# Patient Record
Sex: Male | Born: 1955
Health system: Southern US, Community
[De-identification: ages and names within clinical notes are randomized; demographics above are authoritative.]

## PROBLEM LIST (undated history)

## (undated) DIAGNOSIS — I82622 Acute embolism and thrombosis of deep veins of left upper extremity: Secondary | ICD-10-CM

## (undated) DIAGNOSIS — I619 Nontraumatic intracerebral hemorrhage, unspecified: Secondary | ICD-10-CM

## (undated) DIAGNOSIS — I10 Essential (primary) hypertension: Secondary | ICD-10-CM

## (undated) DIAGNOSIS — Z8619 Personal history of other infectious and parasitic diseases: Secondary | ICD-10-CM

## (undated) DIAGNOSIS — E669 Obesity, unspecified: Secondary | ICD-10-CM

## (undated) DIAGNOSIS — R4189 Other symptoms and signs involving cognitive functions and awareness: Secondary | ICD-10-CM

## (undated) DIAGNOSIS — E785 Hyperlipidemia, unspecified: Secondary | ICD-10-CM

## (undated) DIAGNOSIS — I214 Non-ST elevation (NSTEMI) myocardial infarction: Secondary | ICD-10-CM

## (undated) HISTORY — DX: Obesity, unspecified: E66.9

## (undated) HISTORY — DX: Other symptoms and signs involving cognitive functions and awareness: R41.89

## (undated) HISTORY — DX: Hyperlipidemia, unspecified: E78.5

## (undated) HISTORY — DX: Acute embolism and thrombosis of deep veins of left upper extremity: I82.622

## (undated) HISTORY — PX: ANKLE SURGERY: SHX546

## (undated) HISTORY — DX: Essential (primary) hypertension: I10

## (undated) HISTORY — DX: Non-ST elevation (NSTEMI) myocardial infarction: I21.4

## (undated) HISTORY — DX: Nontraumatic intracerebral hemorrhage, unspecified: I61.9

## (undated) HISTORY — DX: Personal history of other infectious and parasitic diseases: Z86.19

---

## 1998-02-23 ENCOUNTER — Encounter: Admission: RE | Admit: 1998-02-23 | Discharge: 1998-02-23 | Payer: Self-pay | Admitting: Family Medicine

## 1998-03-28 ENCOUNTER — Encounter: Admission: RE | Admit: 1998-03-28 | Discharge: 1998-03-28 | Payer: Self-pay | Admitting: Sports Medicine

## 2002-10-02 ENCOUNTER — Encounter: Payer: Self-pay | Admitting: Emergency Medicine

## 2002-10-02 ENCOUNTER — Inpatient Hospital Stay (HOSPITAL_COMMUNITY): Admission: EM | Admit: 2002-10-02 | Discharge: 2002-10-11 | Payer: Self-pay | Admitting: Emergency Medicine

## 2002-10-04 ENCOUNTER — Encounter: Payer: Self-pay | Admitting: Neurology

## 2002-10-06 ENCOUNTER — Encounter: Payer: Self-pay | Admitting: Neurology

## 2002-10-06 ENCOUNTER — Encounter: Payer: Self-pay | Admitting: Pediatrics

## 2002-10-11 ENCOUNTER — Inpatient Hospital Stay (HOSPITAL_COMMUNITY)
Admission: RE | Admit: 2002-10-11 | Discharge: 2002-10-15 | Payer: Self-pay | Admitting: Physical Medicine & Rehabilitation

## 2002-10-19 ENCOUNTER — Ambulatory Visit (HOSPITAL_COMMUNITY)
Admission: RE | Admit: 2002-10-19 | Discharge: 2002-10-19 | Payer: Self-pay | Admitting: Physical Medicine & Rehabilitation

## 2002-10-19 ENCOUNTER — Encounter
Admission: RE | Admit: 2002-10-19 | Discharge: 2003-01-17 | Payer: Self-pay | Admitting: Physical Medicine & Rehabilitation

## 2002-11-18 ENCOUNTER — Encounter
Admission: RE | Admit: 2002-11-18 | Discharge: 2003-02-16 | Payer: Self-pay | Admitting: Physical Medicine & Rehabilitation

## 2003-01-18 ENCOUNTER — Encounter
Admission: RE | Admit: 2003-01-18 | Discharge: 2003-01-20 | Payer: Self-pay | Admitting: Physical Medicine & Rehabilitation

## 2003-04-15 ENCOUNTER — Emergency Department (HOSPITAL_COMMUNITY): Admission: EM | Admit: 2003-04-15 | Discharge: 2003-04-15 | Payer: Self-pay | Admitting: Emergency Medicine

## 2004-02-11 ENCOUNTER — Emergency Department (HOSPITAL_COMMUNITY): Admission: EM | Admit: 2004-02-11 | Discharge: 2004-02-11 | Payer: Self-pay | Admitting: Emergency Medicine

## 2004-03-12 ENCOUNTER — Ambulatory Visit (HOSPITAL_COMMUNITY): Admission: RE | Admit: 2004-03-12 | Discharge: 2004-03-12 | Payer: Self-pay

## 2005-01-14 DIAGNOSIS — R4189 Other symptoms and signs involving cognitive functions and awareness: Secondary | ICD-10-CM

## 2005-01-14 DIAGNOSIS — I619 Nontraumatic intracerebral hemorrhage, unspecified: Secondary | ICD-10-CM

## 2005-01-14 HISTORY — DX: Nontraumatic intracerebral hemorrhage, unspecified: I61.9

## 2005-01-14 HISTORY — DX: Other symptoms and signs involving cognitive functions and awareness: R41.89

## 2005-11-27 ENCOUNTER — Emergency Department (HOSPITAL_COMMUNITY): Admission: EM | Admit: 2005-11-27 | Discharge: 2005-11-27 | Payer: Self-pay | Admitting: Emergency Medicine

## 2011-06-27 ENCOUNTER — Encounter (HOSPITAL_COMMUNITY): Payer: Self-pay | Admitting: *Deleted

## 2011-06-27 ENCOUNTER — Emergency Department (HOSPITAL_COMMUNITY)
Admission: EM | Admit: 2011-06-27 | Discharge: 2011-06-27 | Disposition: A | Discharge status: Home / Self Care | Payer: Medicare Other | Attending: Emergency Medicine | Admitting: Emergency Medicine

## 2011-06-27 ENCOUNTER — Emergency Department (HOSPITAL_COMMUNITY): Payer: Medicare Other

## 2011-06-27 DIAGNOSIS — Z7982 Long term (current) use of aspirin: Secondary | ICD-10-CM | POA: Insufficient documentation

## 2011-06-27 DIAGNOSIS — I10 Essential (primary) hypertension: Secondary | ICD-10-CM | POA: Insufficient documentation

## 2011-06-27 DIAGNOSIS — Z8673 Personal history of transient ischemic attack (TIA), and cerebral infarction without residual deficits: Secondary | ICD-10-CM | POA: Insufficient documentation

## 2011-06-27 DIAGNOSIS — R42 Dizziness and giddiness: Secondary | ICD-10-CM | POA: Insufficient documentation

## 2011-06-27 LAB — CBC
MCHC: 34 g/dL (ref 30.0–36.0)
Platelets: 230 10*3/uL (ref 150–400)
RDW: 13.1 % (ref 11.5–15.5)
WBC: 7 10*3/uL (ref 4.0–10.5)

## 2011-06-27 LAB — BASIC METABOLIC PANEL
CO2: 25 mEq/L (ref 19–32)
Calcium: 9.5 mg/dL (ref 8.4–10.5)
Chloride: 101 mEq/L (ref 96–112)
GFR calc Af Amer: >90 mL/min (ref >90)
Sodium: 137 mEq/L (ref 135–145)

## 2011-06-27 LAB — DIFFERENTIAL
Basophils Absolute: 0 10*3/uL (ref 0.0–0.1)
Basophils Relative: 0 % (ref 0–1)
Eosinophils Relative: 2 % (ref 0–5)
Lymphocytes Relative: 34 % (ref 12–46)
Neutro Abs: 3.8 10*3/uL (ref 1.7–7.7)

## 2011-06-27 LAB — POCT I-STAT TROPONIN I: Troponin i, poc: 0 ng/mL (ref 0.00–0.08)

## 2011-06-27 LAB — GLUCOSE, CAPILLARY: Glucose-Capillary: 92 mg/dL (ref 70–99)

## 2011-06-27 MED ORDER — LISINOPRIL-HYDROCHLOROTHIAZIDE 20-12.5 MG PO TABS: 1.0000 | ORAL_TABLET | Freq: Every day | ORAL | Status: DC

## 2011-06-27 MED ORDER — LISINOPRIL 20 MG PO TABS
20.0000 mg | ORAL_TABLET | ORAL | Status: AC
  Administered 2011-06-27: 20 mg via ORAL
  Filled 2011-06-27 (×2): qty 1

## 2011-06-27 NOTE — ED Notes (Signed)
PA at bedside.

## 2011-06-27 NOTE — ED Notes (Signed)
Pt reports hx of HTN but has not been taking any medications, states yesterday while mowing the grass felt extremely dizzy. Denies any chest pain. Pt with hx of stroke and has dysarthria due to stroke. Pt reports no new symptoms.

## 2011-06-27 NOTE — Discharge Instructions (Signed)
Start blood pressure medication and follow up closely with your doctor for recheck and ongoing management of blood pressure. Return to ER at any time for emergent changing or worsening of symptoms.   Hypertension Information As your heart beats, it forces blood through your arteries. This force is your blood pressure. If the pressure is too high, it is called hypertension (HTN) or high blood pressure. HTN is dangerous because you may have it and not know it. High blood pressure may mean that your heart has to work harder to pump blood. Your arteries may be narrow or stiff. The extra work puts you at risk for heart disease, stroke, and other problems.  Blood pressure consists of two numbers, a higher number over a lower, 110/72, for example. It is stated as "110 over 72." The ideal is below 120 for the top number (systolic) and under 80 for the bottom (diastolic).  You should pay close attention to your blood pressure if you have certain conditions such as:  Heart failure.   Prior heart attack.   Diabetes   Chronic kidney disease.   Prior stroke.   Multiple risk factors for heart disease.  To see if you have HTN, your blood pressure should be measured while you are seated with your arm held at the level of the heart. It should be measured at least twice. A one-time elevated blood pressure reading (especially in the Emergency Department) does not mean that you need treatment. There may be conditions in which the blood pressure is different between your right and left arms. It is important to see your caregiver soon for a recheck. Most people have essential hypertension which means that there is not a specific cause. This type of high blood pressure may be lowered by changing lifestyle factors such as:  Stress.   Smoking.   Lack of exercise.   Excessive weight.   Drug/tobacco/alcohol use.   Eating less salt.  Most people do not have symptoms from high blood pressure until it has caused  damage to the body. Effective treatment can often prevent, delay or reduce that damage. TREATMENT  Treatment for high blood pressure, when a cause has been identified, is directed at the cause. There are a large number of medications to treat HTN. These fall into several categories, and your caregiver will help you select the medicines that are best for you. Medications may have side effects. You should review side effects with your caregiver. If your blood pressure stays high after you have made lifestyle changes or started on medicines,   Your medication(s) may need to be changed.   Other problems may need to be addressed.   Be certain you understand your prescriptions, and know how and when to take your medicine.   Be sure to follow up with your caregiver within the time frame advised (usually within two weeks) to have your blood pressure rechecked and to review your medications.   If you are taking more than one medicine to lower your blood pressure, make sure you know how and at what times they should be taken. Taking two medicines at the same time can result in blood pressure that is too low.  Document Released: 03/05/2005 Document Revised: 09/12/2010 Document Reviewed: 03/12/2007 [facility] Patient Information 2012 [place], [place].

## 2011-06-27 NOTE — ED Provider Notes (Signed)
12:20 PM  Date: 06/27/2011  Rate: 62  Rhythm: normal sinus rhythm  QRS Axis: left  Intervals: normal  ST/T Wave abnormalities: normal  Conduction Disutrbances:none  Narrative Interpretation: Normal EKG  Old EKG Reviewed: none available    Carleene Cooper III, MD 06/27/11 1221

## 2011-06-27 NOTE — ED Provider Notes (Signed)
History     CSN: [id number]  Arrival date & time 06/27/11  1109   First MD Initiated Contact with Patient 06/27/11 1156      Chief Complaint  Patient presents with  . Hypertension  . Dizziness    (Consider location/radiation/quality/duration/timing/severity/associated sxs/prior treatment) HPI  Patient presents to emergency department with his mother at bedside with complaint of acute onset dizziness that began yesterday afternoon when he was mowing the yard in the heat of the day. Patient states he felt dizzy but finished the yard. Patient states that once he got inside and laid down the dizziness resolved and has felt fine since then with no return of dizziness. Patient states that he feels well today however he mentioned the dizziness to his mother and she also found out that he has not been taking his blood pressure medicine for the last 2-3 years. Mother states she was very concerned because patient has history of stroke stating "years ago he had a blood vessel rupture in his brain because his pressure was so high and he had to be in hospital for 2-3 weeks." Patient has not seen his primary care doctor in many years and mother attempted to get him in with her PCP but was told that it was a two-week wait. Patient states that other than his history of high blood pressure stroke he has no other known medical problems. Patient does take a daily baby aspirin. Patient has some residual dysarthria status post stroke however he has no residual extremity weakness. Once again patient feels well today denying headache, dizziness, facial droop, slurred speech, extremity numbness/tingling/weakness, difficulty ambulating, or loss of coordination. Patient denies any chest pain throughout event yesterday or today. He denies associated shortness of breath.  Past Medical History  Diagnosis Date  . Hypertension   . Stroke     No past surgical history on file.  No family history on file.  History    Substance Use Topics  . Smoking status: Never Smoker   . Smokeless tobacco: Not on file  . Alcohol Use: No      Review of Systems  All other systems reviewed and are negative.    Allergies  Review of patient's allergies indicates no known allergies.  Home Medications  No current outpatient prescriptions on file.  BP 166/94  Pulse 63  Temp 98.5 F (36.9 C) (Oral)  Resp 18  SpO2 99%  Physical Exam  Nursing note and vitals reviewed. Constitutional: He is oriented to person, place, and time. He appears well-developed and well-nourished. No distress.  HENT:  Head: Normocephalic and atraumatic.  Eyes: Conjunctivae and EOM are normal. Pupils are equal, round, and reactive to light.  Neck: Normal range of motion. Neck supple.  Cardiovascular: Normal rate, regular rhythm, normal heart sounds and intact distal pulses.  Exam reveals no gallop and no friction rub.   No murmur heard. Pulmonary/Chest: Effort normal and breath sounds normal. No respiratory distress. He has no wheezes. He has no rales. He exhibits no tenderness.  Abdominal: Soft. Bowel sounds are normal. He exhibits no distension and no mass. There is no tenderness. There is no rebound and no guarding.  Musculoskeletal: Normal range of motion. He exhibits no edema and no tenderness.  Neurological: He is alert and oriented to person, place, and time. No cranial nerve deficit. Coordination normal.       Normal finger to nose and heel to shin  Skin: Skin is warm and dry. No rash noted. He is  not diaphoretic. No erythema.  Psychiatric: He has a normal mood and affect.    ED Course  Procedures (including critical care time)  PO lisinopril.    Labs Reviewed  GLUCOSE, CAPILLARY  CBC  DIFFERENTIAL  BASIC METABOLIC PANEL   Ct Head Wo Contrast  06/27/2011  *RADIOLOGY REPORT*  Clinical Data: 56 year old male with dizziness.  CT HEAD WITHOUT CONTRAST  Technique:  Contiguous axial images were obtained from the base of  the skull through the vertex without contrast.  Comparison: 11/27/2005.  Findings: Left maxillary sinus small mucous retention cyst.  Mild ethmoid mucosal thickening is stable.  Other Visualized paranasal sinuses and mastoids are clear.  No acute osseous abnormality identified.  Visualized orbits and scalp soft tissues are within normal limits.  Mild Calcified atherosclerosis at the skull base.  Chronic left parietal or posterior temporal lobe encephalomalacia, including dystrophic calcification.  Chronic ex vacuo enlargement of the left temporal and occipital horns and atrium.  Other ventricles are stable and within normal limits. No midline shift, mass effect, or evidence of mass lesion.  Stable patchy periventricular white matter hypodensity. No evidence of cortically based acute infarction identified.  No acute intracranial hemorrhage identified.  No suspicious intracranial vascular hyperdensity.  IMPRESSION: No acute intracranial abnormality.  Stable chronic changes including the left hemisphere encephalomalacia with ex vacuo changes to the left lateral ventricle.  Original Report Authenticated By: Harley Hallmark, M.D.     1. Hypertension   2. Dizziness       MDM  Patient had and has no signs or symptoms of TIA or stroke with his brief resolved dizziness that occurred while mowing in the heat yesterday. Has no neuro focal deficits and is ambulating without difficulty. No ataxia. He has no complaints today. His blood pressure has been elevated in the ER but he has no signs or symptoms of hypertensive urgency or emergency. We will restart him on a combination blood pressure medication. His mother and patient are agreeable to close followup with primary care provider in the near future for ongoing management of blood pressure.        [place], [place] 06/27/11 [phone]

## 2011-06-27 NOTE — ED Notes (Signed)
Pt in CT.

## 2011-06-29 NOTE — ED Provider Notes (Signed)
Medical screening examination/treatment/procedure(s) were performed by non-physician practitioner and as supervising physician I was immediately available for consultation/collaboration.   Carleene Cooper III, MD 06/29/11 [phone]

## 2011-07-04 ENCOUNTER — Encounter: Payer: Self-pay | Admitting: Family Medicine

## 2011-07-04 ENCOUNTER — Ambulatory Visit (INDEPENDENT_AMBULATORY_CARE_PROVIDER_SITE_OTHER): Payer: Medicare Other | Admitting: Family Medicine

## 2011-07-04 VITALS — BP 148/91 | HR 69 | Temp 98.4°F | Ht 62.0 in | Wt 194.2 lb

## 2011-07-04 DIAGNOSIS — I10 Essential (primary) hypertension: Secondary | ICD-10-CM

## 2011-07-04 DIAGNOSIS — R4189 Other symptoms and signs involving cognitive functions and awareness: Secondary | ICD-10-CM | POA: Insufficient documentation

## 2011-07-04 DIAGNOSIS — E669 Obesity, unspecified: Secondary | ICD-10-CM

## 2011-07-04 DIAGNOSIS — F09 Unspecified mental disorder due to known physiological condition: Secondary | ICD-10-CM

## 2011-07-04 DIAGNOSIS — I6930 Unspecified sequelae of cerebral infarction: Secondary | ICD-10-CM | POA: Insufficient documentation

## 2011-07-04 DIAGNOSIS — I619 Nontraumatic intracerebral hemorrhage, unspecified: Secondary | ICD-10-CM

## 2011-07-04 DIAGNOSIS — E785 Hyperlipidemia, unspecified: Secondary | ICD-10-CM | POA: Insufficient documentation

## 2011-07-04 MED ORDER — LISINOPRIL-HYDROCHLOROTHIAZIDE 20-12.5 MG PO TABS: 1.0000 | ORAL_TABLET | Freq: Every day | ORAL | Status: DC

## 2011-07-04 NOTE — Assessment & Plan Note (Signed)
Cognitive impairment remains s/p CVA.  Will continue to monitor. Requested records from prior PCP

## 2011-07-04 NOTE — Assessment & Plan Note (Signed)
2/2 HTN.  On aspirin, will ensure chol levels stable when next returns for CPE.

## 2011-07-04 NOTE — Assessment & Plan Note (Signed)
h/o this, check FLP when returns fasting.

## 2011-07-04 NOTE — Assessment & Plan Note (Signed)
Chronic, now compliant with meds. Has only been on med for 1 wk.  Give med more time. Recheck when returns for f/u. rec return for CPE.

## 2011-07-04 NOTE — Progress Notes (Signed)
Subjective:    Patient ID: [name] Cruz, male    DOB: [birth year], 56 y.o.   MRN: [id number]  HPI CC: establish care  Presents to establish care today.  Prior saw Dr. Vear Clock.  Recent ER visit last week after dizzy episode that started when workout out in yard in heat.  Has been off BP meds for last few years.  Has not seen Dr. Vear Clock in several years.  ER records reviewed ==> Cr normal, head CT - no acute changes.  Started on lisinopril/HCTZ.  Compliant with this.  S/p hemorrhagic stroke 2007 with residual cognitive impairment - doesn't drive.  Lives in [place], mother lives in [place].  EtOH use - discussed.  Denies excess.  Mother was concerned.  Preventative: Last CPE - unsure, none recently. Due for this. No colonoscopy, no tetanus shot.  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  Past histories reviewed and updated if relevant as below. Patient Active Problem List  Diagnosis  . Stroke, hemorrhagic  . Cognitive impairment  . HLD (hyperlipidemia)  . HTN (hypertension)  . Obesity   Past Medical History  Diagnosis Date  . Stroke, hemorrhagic 2007    residual cognitive impairment, vision problem, no driving  . Cognitive impairment 2007    after stroke, saw rehab but told to stop because was too upsetting to him  . History of chicken pox   . HLD (hyperlipidemia)   . HTN (hypertension)   . Obesity    Past Surgical History  Procedure Date  . Ankle surgery 1990s    right foot with plate and screws   History  Substance Use Topics  . Smoking status: Never Smoker   . Smokeless tobacco: Never Used  . Alcohol Use: Yes     regular   Family History  Problem Relation Age of Onset  . Alzheimer's disease Maternal Grandfather   . Coronary  artery disease Neg Hx   . Stroke Neg Hx   . Cancer Mother     lymphoma  . Diabetes Neg Hx   . Alcohol abuse Father     smoker   No Known Allergies Current Outpatient Prescriptions on File Prior to Visit  Medication Sig Dispense Refill  . lisinopril-hydrochlorothiazide (ZESTORETIC) 20-12.5 MG per tablet Take 1 tablet by mouth daily.  30 tablet  0     Review of Systems  Constitutional: Negative for fever, chills, activity change, appetite change, fatigue and unexpected weight change.  HENT: Negative for hearing loss and neck pain.   Eyes: Negative for visual disturbance.  Respiratory: Negative for cough, chest tightness, shortness of breath and wheezing.   Cardiovascular: Negative for chest pain, palpitations and leg swelling.  Gastrointestinal: Negative for nausea, vomiting, abdominal pain, diarrhea, constipation, blood in stool and abdominal distention.  Genitourinary: Negative for hematuria and difficulty urinating.  Musculoskeletal: Negative for myalgias and arthralgias.  Skin: Negative for rash.  Neurological: Negative for dizziness, seizures, syncope and headaches.  Hematological: Does not bruise/bleed easily.  Psychiatric/Behavioral: Positive for agitation. Negative for dysphoric mood. The patient is not nervous/anxious.        Objective:   Physical Exam  Nursing note and vitals reviewed. Constitutional: He is oriented to person, place, and time. He appears well-developed and well-nourished. No distress.  HENT:  Head: Normocephalic and atraumatic.  Right  Ear: Hearing, tympanic membrane, external ear and ear canal normal.  Left Ear: Hearing, tympanic membrane, external ear and ear canal normal.  Nose: Nose normal.  Mouth/Throat: Oropharynx is clear and moist. No oropharyngeal exudate.  Eyes: Conjunctivae and EOM are normal. Pupils are equal, round, and reactive to light. No scleral icterus.  Neck: Normal range of motion. Neck supple. No thyromegaly present.    Cardiovascular: Normal rate, regular rhythm, normal heart sounds and intact distal pulses.   No murmur heard. Pulses:      Radial pulses are 2+ on the right side, and 2+ on the left side.  Pulmonary/Chest: Effort normal and breath sounds normal. No respiratory distress. He has no wheezes. He has no rales.  Abdominal: Soft. Bowel sounds are normal. He exhibits no distension and no mass. There is no tenderness. There is no rebound and no guarding.  Musculoskeletal: Normal range of motion. He exhibits no edema.  Lymphadenopathy:    He has no cervical adenopathy.  Neurological: He is alert and oriented to person, place, and time.       CN grossly intact, station and gait intact  Skin: Skin is warm and dry. No rash noted.  Psychiatric: He has a normal mood and affect. His behavior is normal. Judgment and thought content normal.       Assessment & Plan:

## 2011-07-04 NOTE — Patient Instructions (Signed)
Continue med as up to now. Return in 4-6 weeks fasting for blood work and afterwards for physical. Start walking several times a week. Good to see you today, call us with questions.

## 2011-07-04 NOTE — Assessment & Plan Note (Signed)
Encouraged start walking daily

## 2011-08-12 ENCOUNTER — Other Ambulatory Visit: Payer: Self-pay | Admitting: Family Medicine

## 2011-08-12 ENCOUNTER — Other Ambulatory Visit (INDEPENDENT_AMBULATORY_CARE_PROVIDER_SITE_OTHER): Payer: Medicare Other

## 2011-08-12 DIAGNOSIS — I619 Nontraumatic intracerebral hemorrhage, unspecified: Secondary | ICD-10-CM

## 2011-08-12 DIAGNOSIS — E785 Hyperlipidemia, unspecified: Secondary | ICD-10-CM

## 2011-08-12 DIAGNOSIS — F09 Unspecified mental disorder due to known physiological condition: Secondary | ICD-10-CM

## 2011-08-12 DIAGNOSIS — E669 Obesity, unspecified: Secondary | ICD-10-CM

## 2011-08-12 DIAGNOSIS — R4189 Other symptoms and signs involving cognitive functions and awareness: Secondary | ICD-10-CM

## 2011-08-12 DIAGNOSIS — I10 Essential (primary) hypertension: Secondary | ICD-10-CM

## 2011-08-12 LAB — TSH: TSH: 0.85 u[IU]/mL (ref 0.35–5.50)

## 2011-08-12 LAB — LIPID PANEL
Total CHOL/HDL Ratio: 7
Triglycerides: 348 mg/dL — ABNORMAL HIGH (ref 0.0–149.0)

## 2011-08-12 LAB — COMPREHENSIVE METABOLIC PANEL
ALT: 16 U/L (ref 0–53)
Albumin: 4.1 g/dL (ref 3.5–5.2)
CO2: 28 mEq/L (ref 19–32)
Calcium: 9.8 mg/dL (ref 8.4–10.5)
Chloride: 97 mEq/L (ref 96–112)
GFR: 130.29 mL/min (ref >60.00)
Glucose, Bld: 89 mg/dL (ref 70–99)
Sodium: 134 mEq/L — ABNORMAL LOW (ref 135–145)
Total Bilirubin: 0.5 mg/dL (ref 0.3–1.2)
Total Protein: 6.9 g/dL (ref 6.0–8.3)

## 2011-08-12 MED ORDER — ATORVASTATIN CALCIUM 40 MG PO TABS: 40.0000 mg | ORAL_TABLET | Freq: Every day | ORAL | Status: DC

## 2011-08-13 ENCOUNTER — Telehealth: Payer: Self-pay

## 2011-08-13 NOTE — Telephone Encounter (Signed)
pts mother called to get lab results. Pt did not understand recording. Patient's mother notified as instructed by telephone with the result notes. Also advised med was sent to [facility] [address] Mrs. [name] Cruz will pick up med for pt. Mrs [name] Cruz ask all calls come to her due to pt's not understanding.

## 2011-08-19 ENCOUNTER — Encounter: Payer: Medicare Other | Admitting: Family Medicine

## 2012-04-26 ENCOUNTER — Inpatient Hospital Stay (HOSPITAL_COMMUNITY)
Admission: EM | Admit: 2012-04-26 | Discharge: 2012-04-28 | DRG: 101 | Disposition: A | Discharge status: Home / Self Care | Payer: Medicare Other | Attending: Internal Medicine | Admitting: Internal Medicine

## 2012-04-26 ENCOUNTER — Encounter (HOSPITAL_COMMUNITY): Payer: Self-pay | Admitting: Emergency Medicine

## 2012-04-26 ENCOUNTER — Emergency Department (HOSPITAL_COMMUNITY): Payer: Medicare Other

## 2012-04-26 DIAGNOSIS — G40909 Epilepsy, unspecified, not intractable, without status epilepticus: Secondary | ICD-10-CM | POA: Diagnosis present

## 2012-04-26 DIAGNOSIS — I10 Essential (primary) hypertension: Secondary | ICD-10-CM | POA: Diagnosis present

## 2012-04-26 DIAGNOSIS — F1010 Alcohol abuse, uncomplicated: Secondary | ICD-10-CM | POA: Diagnosis present

## 2012-04-26 DIAGNOSIS — R4189 Other symptoms and signs involving cognitive functions and awareness: Secondary | ICD-10-CM

## 2012-04-26 DIAGNOSIS — R4182 Altered mental status, unspecified: Secondary | ICD-10-CM

## 2012-04-26 DIAGNOSIS — R569 Unspecified convulsions: Principal | ICD-10-CM

## 2012-04-26 DIAGNOSIS — I619 Nontraumatic intracerebral hemorrhage, unspecified: Secondary | ICD-10-CM

## 2012-04-26 DIAGNOSIS — E669 Obesity, unspecified: Secondary | ICD-10-CM | POA: Diagnosis present

## 2012-04-26 DIAGNOSIS — I69919 Unspecified symptoms and signs involving cognitive functions following unspecified cerebrovascular disease: Secondary | ICD-10-CM

## 2012-04-26 DIAGNOSIS — E785 Hyperlipidemia, unspecified: Secondary | ICD-10-CM | POA: Diagnosis present

## 2012-04-26 DIAGNOSIS — Z6832 Body mass index (BMI) 32.0-32.9, adult: Secondary | ICD-10-CM

## 2012-04-26 DIAGNOSIS — I6930 Unspecified sequelae of cerebral infarction: Secondary | ICD-10-CM | POA: Diagnosis present

## 2012-04-26 LAB — CBC
HCT: 43.8 % (ref 39.0–52.0)
Hemoglobin: 14.9 g/dL (ref 13.0–17.0)
MCH: 31.5 pg (ref 26.0–34.0)
MCHC: 34 g/dL (ref 30.0–36.0)
MCV: 92.6 fL (ref 78.0–100.0)
Platelets: 292 10*3/uL (ref 150–400)
RBC: 4.73 MIL/uL (ref 4.22–5.81)
RDW: 12.9 % (ref 11.5–15.5)
WBC: 15.1 K/uL — ABNORMAL HIGH (ref 4.0–10.5)

## 2012-04-26 LAB — POCT I-STAT, CHEM 8
Chloride: 108 mEq/L (ref 96–112)
Glucose, Bld: 194 mg/dL — ABNORMAL HIGH (ref 70–99)
HCT: 46 % (ref 39.0–52.0)
Potassium: 3.4 mEq/L — ABNORMAL LOW (ref 3.5–5.1)

## 2012-04-26 LAB — GLUCOSE, CAPILLARY: Glucose-Capillary: 167 mg/dL — ABNORMAL HIGH (ref 70–99)

## 2012-04-26 LAB — APTT: aPTT: 30 seconds (ref 24–37)

## 2012-04-26 LAB — URINALYSIS, ROUTINE W REFLEX MICROSCOPIC
Hgb urine dipstick: NEGATIVE
Ketones, ur: 15 mg/dL — AB
Protein, ur: NEGATIVE mg/dL
Urobilinogen, UA: 0.2 mg/dL (ref 0.0–1.0)

## 2012-04-26 LAB — DIFFERENTIAL
Basophils Absolute: 0.1 10*3/uL (ref 0.0–0.1)
Basophils Relative: 1 % (ref 0–1)
Eosinophils Absolute: 0.2 K/uL (ref 0.0–0.7)
Eosinophils Relative: 1 % (ref 0–5)
Lymphocytes Relative: 37 % (ref 12–46)
Lymphs Abs: 5.6 K/uL — ABNORMAL HIGH (ref 0.7–4.0)
Monocytes Absolute: 0.9 K/uL (ref 0.1–1.0)
Monocytes Relative: 6 % (ref 3–12)
Neutro Abs: 8.3 10*3/uL — ABNORMAL HIGH (ref 1.7–7.7)
Neutrophils Relative %: 55 % (ref 43–77)

## 2012-04-26 LAB — COMPREHENSIVE METABOLIC PANEL WITH GFR
ALT: 19 U/L (ref 0–53)
BUN: 19 mg/dL (ref 6–23)
CO2: 13 meq/L — ABNORMAL LOW (ref 19–32)
Calcium: 10.1 mg/dL (ref 8.4–10.5)
Creatinine, Ser: 1.14 mg/dL (ref 0.50–1.35)
GFR calc Af Amer: 81 mL/min — ABNORMAL LOW (ref >90)
GFR calc non Af Amer: 70 mL/min — ABNORMAL LOW (ref >90)
Glucose, Bld: 194 mg/dL — ABNORMAL HIGH (ref 70–99)
Sodium: 143 meq/L (ref 135–145)

## 2012-04-26 LAB — ETHANOL: Alcohol, Ethyl (B): <11 mg/dL (ref 0–11)

## 2012-04-26 LAB — RAPID URINE DRUG SCREEN, HOSP PERFORMED
Amphetamines: NOT DETECTED
Benzodiazepines: POSITIVE — AB
Opiates: NOT DETECTED
Tetrahydrocannabinol: NOT DETECTED

## 2012-04-26 LAB — PROTIME-INR
INR: 1.1 (ref 0.00–1.49)
Prothrombin Time: 14.1 seconds (ref 11.6–15.2)

## 2012-04-26 LAB — COMPREHENSIVE METABOLIC PANEL
AST: 28 U/L (ref 0–37)
Albumin: 4.5 g/dL (ref 3.5–5.2)
Alkaline Phosphatase: 68 U/L (ref 39–117)
Chloride: 98 mEq/L (ref 96–112)
Potassium: 3.4 mEq/L — ABNORMAL LOW (ref 3.5–5.1)
Total Bilirubin: 0.4 mg/dL (ref 0.3–1.2)
Total Protein: 8.1 g/dL (ref 6.0–8.3)

## 2012-04-26 LAB — TROPONIN I: Troponin I: <0.3 ng/mL (ref <0.30)

## 2012-04-26 MED ORDER — LIDOCAINE HCL (CARDIAC) 20 MG/ML IV SOLN
INTRAVENOUS | Status: AC
  Filled 2012-04-26: qty 5

## 2012-04-26 MED ORDER — LISINOPRIL-HYDROCHLOROTHIAZIDE 20-12.5 MG PO TABS: 1.0000 | ORAL_TABLET | Freq: Every day | ORAL | Status: DC

## 2012-04-26 MED ORDER — ROCURONIUM BROMIDE 50 MG/5ML IV SOLN
INTRAVENOUS | Status: AC
  Filled 2012-04-26: qty 2

## 2012-04-26 MED ORDER — THIAMINE HCL 100 MG/ML IJ SOLN
100.0000 mg | Freq: Every day | INTRAMUSCULAR | Status: DC
  Filled 2012-04-26 (×3): qty 1

## 2012-04-26 MED ORDER — LORAZEPAM 2 MG/ML IJ SOLN: 2.0000 mg | INTRAMUSCULAR | Status: DC | PRN

## 2012-04-26 MED ORDER — ONDANSETRON HCL 4 MG PO TABS: 4.0000 mg | ORAL_TABLET | Freq: Four times a day (QID) | ORAL | Status: DC | PRN

## 2012-04-26 MED ORDER — SUCCINYLCHOLINE CHLORIDE 20 MG/ML IJ SOLN
INTRAMUSCULAR | Status: AC
  Filled 2012-04-26: qty 1

## 2012-04-26 MED ORDER — POLYETHYLENE GLYCOL 3350 17 G PO PACK
17.0000 g | PACK | Freq: Every day | ORAL | Status: DC | PRN
  Filled 2012-04-26: qty 1

## 2012-04-26 MED ORDER — LORAZEPAM 2 MG/ML IJ SOLN: 1.0000 mg | Freq: Four times a day (QID) | INTRAMUSCULAR | Status: DC | PRN

## 2012-04-26 MED ORDER — PNEUMOCOCCAL VAC POLYVALENT 25 MCG/0.5ML IJ INJ
0.5000 mL | INJECTION | INTRAMUSCULAR | Status: AC
  Administered 2012-04-27: 0.5 mL via INTRAMUSCULAR
  Filled 2012-04-26: qty 0.5

## 2012-04-26 MED ORDER — LISINOPRIL 20 MG PO TABS
20.0000 mg | ORAL_TABLET | Freq: Every day | ORAL | Status: DC
  Administered 2012-04-26 – 2012-04-28 (×3): 20 mg via ORAL
  Filled 2012-04-26 (×3): qty 1

## 2012-04-26 MED ORDER — DEXTROSE-NACL 5-0.45 % IV SOLN: INTRAVENOUS | Status: DC

## 2012-04-26 MED ORDER — LORAZEPAM 1 MG PO TABS: 1.0000 mg | ORAL_TABLET | Freq: Four times a day (QID) | ORAL | Status: DC | PRN

## 2012-04-26 MED ORDER — SODIUM CHLORIDE 0.9 % IV SOLN
1000.0000 mg | Freq: Once | INTRAVENOUS | Status: AC
  Administered 2012-04-26: 1000 mg via INTRAVENOUS
  Filled 2012-04-26: qty 20

## 2012-04-26 MED ORDER — SODIUM CHLORIDE 0.9 % IV SOLN
100.0000 mg | Freq: Three times a day (TID) | INTRAVENOUS | Status: DC
  Administered 2012-04-27 (×4): 100 mg via INTRAVENOUS
  Filled 2012-04-26 (×6): qty 2

## 2012-04-26 MED ORDER — SODIUM CHLORIDE 0.9 % IJ SOLN
3.0000 mL | Freq: Two times a day (BID) | INTRAMUSCULAR | Status: DC
  Administered 2012-04-27: 3 mL via INTRAVENOUS

## 2012-04-26 MED ORDER — ATORVASTATIN CALCIUM 40 MG PO TABS
40.0000 mg | ORAL_TABLET | Freq: Every day | ORAL | Status: DC
  Administered 2012-04-26 – 2012-04-27 (×2): 40 mg via ORAL
  Filled 2012-04-26 (×3): qty 1

## 2012-04-26 MED ORDER — FOLIC ACID 1 MG PO TABS
1.0000 mg | ORAL_TABLET | Freq: Every day | ORAL | Status: DC
  Administered 2012-04-26 – 2012-04-28 (×3): 1 mg via ORAL
  Filled 2012-04-26 (×3): qty 1

## 2012-04-26 MED ORDER — ASPIRIN EC 81 MG PO TBEC
81.0000 mg | DELAYED_RELEASE_TABLET | Freq: Every day | ORAL | Status: DC
  Administered 2012-04-26 – 2012-04-28 (×3): 81 mg via ORAL
  Filled 2012-04-26 (×3): qty 1

## 2012-04-26 MED ORDER — VITAMIN B-1 100 MG PO TABS
100.0000 mg | ORAL_TABLET | Freq: Every day | ORAL | Status: DC
  Administered 2012-04-26 – 2012-04-28 (×3): 100 mg via ORAL
  Filled 2012-04-26 (×3): qty 1

## 2012-04-26 MED ORDER — ADULT MULTIVITAMIN W/MINERALS CH
1.0000 | ORAL_TABLET | Freq: Every day | ORAL | Status: DC
  Administered 2012-04-26 – 2012-04-28 (×3): 1 via ORAL
  Filled 2012-04-26 (×3): qty 1

## 2012-04-26 MED ORDER — HYDROCHLOROTHIAZIDE 12.5 MG PO CAPS
12.5000 mg | ORAL_CAPSULE | Freq: Every day | ORAL | Status: DC
  Administered 2012-04-26 – 2012-04-28 (×3): 12.5 mg via ORAL
  Filled 2012-04-26 (×3): qty 1

## 2012-04-26 MED ORDER — HEPARIN SODIUM (PORCINE) 5000 UNIT/ML IJ SOLN
5000.0000 [IU] | Freq: Three times a day (TID) | INTRAMUSCULAR | Status: DC
  Administered 2012-04-26 – 2012-04-28 (×5): 5000 [IU] via SUBCUTANEOUS
  Filled 2012-04-26 (×8): qty 1

## 2012-04-26 MED ORDER — ETOMIDATE 2 MG/ML IV SOLN
INTRAVENOUS | Status: AC
  Filled 2012-04-26: qty 20

## 2012-04-26 MED ORDER — ONDANSETRON HCL 4 MG/2ML IJ SOLN: 4.0000 mg | Freq: Four times a day (QID) | INTRAMUSCULAR | Status: DC | PRN

## 2012-04-26 NOTE — ED Notes (Signed)
Pt CBG in [address]

## 2012-04-26 NOTE — ED Notes (Addendum)
Per EMS pt was at home & pt was c/o L arm pain & started to seize for about 3 mins, pt postictal upon EMS arrival, pt noted to have R grip weakness, R arm weakness, pt actively seized with EMS for 90 secs & received 5 mg Versed, upon arrival to ED pt non responsive to painful stimuli

## 2012-04-26 NOTE — ED Notes (Signed)
Pt removed oral airway. Respiratory at bedside, pt maintaining 98% O2 sat

## 2012-04-26 NOTE — ED Notes (Signed)
Family at bedside. 

## 2012-04-26 NOTE — ED Notes (Signed)
Ghim, MD notified re: UA & Urine drug screen, informed that pt is sedated & it is okay to wait until the pt is more alert to collect urine sample

## 2012-04-26 NOTE — ED Provider Notes (Addendum)
History     CSN: [id number]  Arrival date & time 04/26/12  1409   First MD Initiated Contact with Patient 04/26/12 1424      Chief Complaint  Patient presents with  . Code Stroke    (Consider location/radiation/quality/duration/timing/severity/associated sxs/prior treatment) HPI Comments: Level 5 caveat due to altered mental status.  Her history of hemorrhagic stroke from an aneurysm according to patient's sister who is also healthcare power of attorney. Patient was staying with his sister overnight and did his usual baseline and today was outside with his brother-in-law. The patient began complaining of numbness and abnormal sensations to his left upper extremity. Sister who is here at the bedside came over to the brother and saw him fall over from sitting position and began having full body shaking. EMS reports that he was post ictal and confused initially off of his baseline mentation. Before he regained completely back to his baseline, the patient had another what sounds like grand mal seizure on the ambulance truck. They administered 5 mg of IV Versed for his seizure. Patient currently is snoring respirations but spontaneous with no spontaneous movements.  The history is provided by a relative and medical records.    Past Medical History  Diagnosis Date  . Stroke, hemorrhagic 2007    thought 2/2 HTN (240sbp); residual cognitive impairment, vision problem, no driving  . Cognitive impairment 2007    after stroke, saw rehab but told to stop because was too upsetting to him  . History of chicken pox   . HLD (hyperlipidemia)   . HTN (hypertension)   . Obesity     Past Surgical History  Procedure Laterality Date  . Ankle surgery  1990s    right foot with plate and screws    Family History  Problem Relation Age of Onset  . Alzheimer's disease Maternal Grandfather   . Coronary artery disease Neg Hx   . Stroke Neg Hx   . Cancer Mother     lymphoma  . Diabetes Neg Hx   .  Alcohol abuse Father     smoker    History  Substance Use Topics  . Smoking status: Never Smoker   . Smokeless tobacco: Never Used  . Alcohol Use: Yes     Comment: regular      Review of Systems  Unable to perform ROS: Mental status change    Allergies  Review of patient's allergies indicates no known allergies.  Home Medications   Current Outpatient Rx  Name  Route  Sig  Dispense  Refill  . aspirin EC 81 MG tablet   Oral   Take 81 mg by mouth daily.         Marland Kitchen atorvastatin (LIPITOR) 40 MG tablet   Oral   Take 1 tablet (40 mg total) by mouth daily.   90 tablet   3   . lisinopril-hydrochlorothiazide (ZESTORETIC) 20-12.5 MG per tablet   Oral   Take 1 tablet by mouth daily.   30 tablet   6     BP 125/66  Pulse 118  Resp 19  SpO2 98%  Physical Exam  Nursing note and vitals reviewed. Constitutional: He appears well-developed and well-nourished.  Non-toxic appearance. No distress. He is sedated. Nasal cannula in place.  HENT:  Head: Normocephalic and atraumatic.  Cardiovascular: Normal rate.   Pulmonary/Chest: Effort normal. He has no wheezes. He has no rales.  Abdominal: Soft. He exhibits no distension. There is no tenderness.  Neurological: GCS eye subscore  is 2. GCS verbal subscore is 2. GCS motor subscore is 4.  Pt with depressed mentation due to post ictal state and received IV versed by EMS for seizure.    Skin: Skin is warm.    ED Course  Procedures (including critical care time)  Labs Reviewed  CBC - Abnormal; Notable for the following:    WBC 15.1 (*)    All other components within normal limits  DIFFERENTIAL - Abnormal; Notable for the following:    Neutro Abs 8.3 (*)    Lymphs Abs 5.6 (*)    All other components within normal limits  GLUCOSE, CAPILLARY - Abnormal; Notable for the following:    Glucose-Capillary 167 (*)    All other components within normal limits  POCT I-STAT, CHEM 8 - Abnormal; Notable for the following:    Potassium  3.4 (*)    Glucose, Bld 194 (*)    All other components within normal limits  PROTIME-INR  APTT  ETHANOL  COMPREHENSIVE METABOLIC PANEL  TROPONIN I  URINE RAPID DRUG SCREEN (HOSP PERFORMED)  URINALYSIS, ROUTINE W REFLEX MICROSCOPIC   Ct Head Wo Contrast  04/26/2012  *RADIOLOGY REPORT*  Clinical Data: Code stroke.  Seizure.  Right arm weakness and disorientation.  CT HEAD WITHOUT CONTRAST  Technique:  Contiguous axial images were obtained from the base of the skull through the vertex without contrast.  Comparison: Head CT scan 06/27/2011.  Findings: Ex vacuo dilatation of the left lateral ventricle, greatest in the posterior horn, with a small calcification in the periphery the posterior horn is again seen.  No evidence of acute abnormality including infarct, hemorrhage, mass lesion, mass effect, midline shift or abnormal extra-axial fluid collection. Mild periventricular white matter hypointensity is unchanged.  The calvarium is intact.  IMPRESSION: No acute finding.  Stable compared to prior exam.   Original Report Authenticated By: Holley Dexter, M.D.      1. Seizure   2. Altered mental status     RA sat 92% which is interpreted to be low, placed on [place] O2  ECG at time 14:22 shows sinus tachycardia at rate 121, LAD, poor r wave progression from lead V2 to v3, no ST or T wave abn's.  Abn ECG.  Except for faster rate, no sig change comapred to ECG from 06/27/11.  3:08 PM Pt is more alert, sats are being maintained.  Will discuss with unassigned medicine for admission.     3:37 PM Spoke to [facility], however pt has in EPIC, notes from [name] who is at [facility] in [place], not [place] as first mentioned by pt's family.  Will admit to [facility].    MDM  Pt apparently had 2 seizures with no return to baseline mentation.  Pt with transient drop of O2 sats in the room, snoring respirations, likely some underlying obstructive apnea process, undiagnosed, coupled with  post ictal state and having received chemical sedation by EMS.  Pt with manipulating jaw and applying face mask, sats are improved to 96%.  I expect respirations to improve as versed wears of.  Will need to carefully monitor airway.  Pt seen by neurologist as EMS had called code stroke.  Not a TPA candidate as not likely CVA and pt had 2 seizures by history, witnessed.  Head CT I reviewed myself and per radiologist, no acute hemorrhage.  Prior CVA evidnece seen.  Pt will need admission, neuro recommended load with dilantin and can be admitted by medicine.  Gavin Pound. Oletta Lamas, MD 04/26/12 1538  Gavin Pound. Veretta Sabourin, MD 04/26/12 1547

## 2012-04-26 NOTE — ED Notes (Signed)
Code stroke encoded @13 :50, Code Stroke encoded 13:49, pt arrived to ED 13:04, Stroke team Phlebotomy & EDP exam &  @ 14:05, Pt arrival to CT @14 :08, Last seen normal today @ 12:45, Code Stroke cancelled by Thad Ranger, MD at 14:26

## 2012-04-26 NOTE — ED Notes (Signed)
Ortiz, MD at bedside.  

## 2012-04-26 NOTE — H&P (Signed)
[facility] History and Physical  [name] Cruz ZOX:[id number] DOB: 1955-06-26 DOA: 04/26/2012  Referring physician: Dr. Oletta Lamas PCP: [name], MD  Specialists: Neuro  Chief Complaint: seizures.  HPI: [name] Cruz is a 57 y.o. male  Past medical history of hemorrhagic stroke in 2007 with residual cognitive impairment, also a significant past medical history of alcohol consumption comes in with seizure. Today he was working with his brother-in-law around 45 PM when he started complaining about normal movements of his left knee and in developed into grand mal seizures. EMS got to the scene and he was post ictal the patient had another seizure while in route he was given 5 of Versed. On my examination he was sleepy unable to answer questions and only responds cyst to painful stimuli.  In the ED: CT scan of the head was done that showed no acute findings. Mild leukocytosis and saturations of improved with oxygen.  Review of Systems: The patient denies anorexia, fever, weight loss,, vision loss, decreased hearing, hoarseness, chest pain, syncope, dyspnea on exertion, peripheral edema, balance deficits, hemoptysis, abdominal pain, melena, hematochezia, severe indigestion/heartburn, hematuria, incontinence, genital sores, muscle weakness, suspicious skin lesions, transient blindness, difficulty walking, depression, unusual weight change, abnormal bleeding, enlarged lymph nodes, angioedema, and breast masses.    Past Medical History  Diagnosis Date  . Stroke, hemorrhagic 2007    thought 2/2 HTN (240sbp); residual cognitive impairment, vision problem, no driving  . Cognitive impairment 2007    after stroke, saw rehab but told to stop because was too upsetting to him  . History of chicken pox   . HLD (hyperlipidemia)   . HTN (hypertension)   . Obesity    Past Surgical History  Procedure Laterality Date  . Ankle surgery  1990s    right foot with plate and screws   Social History:   reports that he has never smoked. He has never used smokeless tobacco. He reports that he drinks about 8.4 ounces of alcohol per week. He reports that he does not use illicit drugs. Lives at home by himself does not drive  No Known Allergies  Family History  Problem Relation Age of Onset  . Alzheimer's disease Maternal Grandfather   . Coronary artery disease Neg Hx   . Stroke Neg Hx   . Cancer Mother     lymphoma  . Diabetes Neg Hx   . Alcohol abuse Father     smoker    Prior to Admission medications   Medication Sig Start Date End Date Taking? Authorizing Provider  aspirin EC 81 MG tablet Take 81 mg by mouth daily.   Yes Historical Provider, MD  atorvastatin (LIPITOR) 40 MG tablet Take 1 tablet (40 mg total) by mouth daily. 08/12/11 08/11/12 Yes [name], MD  lisinopril-hydrochlorothiazide (ZESTORETIC) 20-12.5 MG per tablet Take 1 tablet by mouth daily. 07/04/11 07/03/12 Yes [name], MD   Physical Exam: Filed Vitals:   04/26/12 1542 04/26/12 1545 04/26/12 1600 04/26/12 1615  BP: 120/68 119/68 92/57 111/71  Pulse: 110 105 99 96  Resp: 18 13 25 13   SpO2: 94% 96% 92% 95%    BP 111/71  Pulse 96  Resp 13  SpO2 95%  General Appearance:    lethargic   Head:    Normocephalic, without obvious abnormality, atraumatic              Neck:   Supple, symmetrical, trachea midline, no adenopathy;       thyroid:  No enlargement/tenderness/nodules; no  carotid   bruit or JVD  Back:     Symmetric, no curvature, ROM normal, no CVA tenderness  Lungs:     Clear to auscultation bilaterally, respirations unlabored  Chest wall:    No tenderness or deformity  Heart:    Regular rate and rhythm, S1 and S2 normal, no murmur, rub   or gallop  Abdomen:     Soft, non-tender, bowel sounds active all four quadrants,    no masses, no organomegaly        Extremities:   Extremities normal, atraumatic, no cyanosis or edema  Pulses:   2+ and symmetric all extremities  Skin:   Skin  color, texture, turgor normal, no rashes or lesions  Lymph nodes:   Cervical, supraclavicular, and axillary nodes normal  Neurologic:   only responsive to painful stimuli extraocular movements are intact pupils are equally round and reactive to light, no facial droop extraocular movements are intact.reflexes are intact he is moving all 4 extremities without any difficulties.     Labs on Admission:  Basic Metabolic Panel:  Recent Labs Lab 04/26/12 1411 04/26/12 1421  NA 143 143  K 3.4* 3.4*  CL 108 98  CO2  --  13*  GLUCOSE 194* 194*  BUN 20 19  CREATININE 1.10 1.14  CALCIUM  --  10.1   Liver Function Tests:  Recent Labs Lab 04/26/12 1421  AST 28  ALT 19  ALKPHOS 68  BILITOT 0.4  PROT 8.1  ALBUMIN 4.5   No results found for this basename: LIPASE, AMYLASE,  in the last 168 hours No results found for this basename: AMMONIA,  in the last 168 hours CBC:  Recent Labs Lab 04/26/12 1411 04/26/12 1421  WBC  --  15.1*  NEUTROABS  --  8.3*  HGB 15.6 14.9  HCT 46.0 43.8  MCV  --  92.6  PLT  --  292   Cardiac Enzymes:  Recent Labs Lab 04/26/12 1422  TROPONINI <0.30    BNP (last 3 results) No results found for this basename: PROBNP,  in the last 8760 hours CBG:  Recent Labs Lab 04/26/12 1449  GLUCAP 167*    Radiological Exams on Admission: Ct Head Wo Contrast  04/26/2012  *RADIOLOGY REPORT*  Clinical Data: Code stroke.  Seizure.  Right arm weakness and disorientation.  CT HEAD WITHOUT CONTRAST  Technique:  Contiguous axial images were obtained from the base of the skull through the vertex without contrast.  Comparison: Head CT scan 06/27/2011.  Findings: Ex vacuo dilatation of the left lateral ventricle, greatest in the posterior horn, with a small calcification in the periphery the posterior horn is again seen.  No evidence of acute abnormality including infarct, hemorrhage, mass lesion, mass effect, midline shift or abnormal extra-axial fluid collection. Mild  periventricular white matter hypointensity is unchanged.  The calvarium is intact.  IMPRESSION: No acute finding.  Stable compared to prior exam.   Original Report Authenticated By: Holley Dexter, M.D.     EKG: Independently reviewed. None  Assessment/Plan   Seizures: - I agree with neurology to load him up with Dilantin. - Check EEG. - Brain MRI. - Check a TSH. - Ativan to when necessary for any breakthrough seizures. - Will discuss with neurology if can use other drug,  secondary to his alcohol abuse. Liver enzymes are within normal limits.  HLD (hyperlipidemia): -Continue statins.  HTN (hypertension): - Resume home medications.  ETOH abuse: - Monitor with CIWA protocol, start him on thiamine and  folate.   Neurology  Code Status: full Family Communication: sisters Disposition Plan: Inpatient   Time spent: 65 minutes  Marinda Elk [facility] Pager [phone]  If 7PM-7AM, please contact night-coverage [email or web address] Password [facility] 04/26/2012, 4:23 PM

## 2012-04-26 NOTE — ED Notes (Signed)
Code Stroke canceled Thad Ranger, MD

## 2012-04-26 NOTE — Consult Note (Signed)
Reason for Consult: Seizure Referring Physician: Dr.Ghim  CC: Seizure  HPI: [name] Cruz is a 57 y.o. male with a history of a hemorrhagic stroke in 2004 requiring inpatient rehabilitation The patient recovered completely except for cognitive deficits. He's been on disability since that time secondary to his mental impairment. He is able to live alone and independently. He has had no regular medical follow up. Today's history is obtained from the patient's sister who has medical power of attorney.  The patient was at his sister's home today and was in the yard with his brother-in-law. The patient complained of some left arm discomfort and sat down on a hay bale. The patient's brother-in-law went to the house to tell the patient's sister. They returned just in time to see the patient fall off of the hay bale and have a grand mal seizure. This occurred at approximately 12:45 PM. EMS was summoned. Apparently the patient was post ictal when they arrived. In the ambulance on the way to [facility] the paramedics felt that the patient had some right-sided weakness. The patient had another grand mal seizure in route and received 5 mg of Versed. The patient was obtunded when he arrived at the emergency department. A stat CT scan was obtained of the head which showed no acute findings. At this time it is not felt that the patient suffered a stroke. It is believed that this is new onset of seizure activity. Of note the patient does have a history of alcohol abuse. His sister reports that he had 12 beers in the previous 24 hours. This is not unusual for the patient. The plan at this time is to treat with anticonvulsants.  LKW: 1230 on 04/26/2012 tPA Given: No; patient with seizures, not felt to be a stroke, h/o ICH  Past Medical History  Diagnosis Date  . Stroke, hemorrhagic 2007    thought 2/2 HTN (240sbp); residual cognitive impairment, vision problem, no driving  . Cognitive impairment 2007     after stroke, saw rehab but told to stop because was too upsetting to him  . History of chicken pox   . HLD (hyperlipidemia)   . HTN (hypertension)   . Obesity     Past Surgical History  Procedure Laterality Date  . Ankle surgery  1990s    right foot with plate and screws    Family History  Problem Relation Age of Onset  . Alzheimer's disease Maternal Grandfather   . Coronary artery disease Neg Hx   . Stroke Neg Hx   . Cancer Mother     lymphoma  . Diabetes Neg Hx   . Alcohol abuse Father     smoker    Social History:  reports that he has never smoked. He has never used smokeless tobacco. He reports that  drinks alcohol. He reports that he does not use illicit drugs. The patient is on disability. He lives alone and independently in [place] He is divorced. He has 2 children living. One child died in a motor vehicle accident. The patient regularly drinks alcohol to excess.  No Known Allergies  Medications:  Scheduled: . etomidate      . lidocaine (cardiac) 100 mg/82ml      . rocuronium      . succinylcholine        ROS: Unobtainable at this time as the patient is obtunded. The patient's sister reports that the patient has not had any recent health complaints.  Physical Examination: BP -  125/66      HR - 118      RR - 19     SpO2 - 98%  Neurological Examination: Patient s/p Versed Mental Status: Patient does not respond to verbal stimuli.  Does not respond to deep sternal rub.  Does not follow commands.  No verbalizations noted.  Cranial Nerves: II: patient does not respond confrontation bilaterally, pupils right 3 mm, left 3 mm,and reactive bilaterally III,IV,VI: doll's response present bilaterally.  V,VII: corneal reflex reduced bilaterally  VIII: patient does not respond to verbal stimuli IX,X: gag reflex reduced, XI: trapezius strength unable to test bilaterally XII: tongue strength unable to test Motor: Extremities flaccid throughout.  No spontaneous movement  noted.  No purposeful movements noted. Sensory: Patient does not respond to noxious stimuli in any extremity Deep Tendon Reflexes:  1+ in the upper extremities, absent in the lower extremities Plantars: Mute bilaterally Cerebellar: Unable to perform  Laboratory Studies:   Basic Metabolic Panel:  Recent Labs Lab 04/26/12 1411  NA 143  K 3.4*  CL 108  GLUCOSE 194*  BUN 20  CREATININE 1.10    Liver Function Tests: No results found for this basename: AST, ALT, ALKPHOS, BILITOT, PROT, ALBUMIN,  in the last 168 hours No results found for this basename: LIPASE, AMYLASE,  in the last 168 hours No results found for this basename: AMMONIA,  in the last 168 hours  CBC:  Recent Labs Lab 04/26/12 1411  HGB 15.6  HCT 46.0    Cardiac Enzymes: No results found for this basename: CKTOTAL, CKMB, CKMBINDEX, TROPONINI,  in the last 168 hours  BNP: No components found with this basename: POCBNP,   CBG: No results found for this basename: GLUCAP,  in the last 168 hours  Microbiology: No results found for this or any previous visit.  Coagulation Studies: No results found for this basename: LABPROT, INR,  in the last 72 hours  Urinalysis: No results found for this basename: COLORURINE, APPERANCEUR, LABSPEC, PHURINE, GLUCOSEU, HGBUR, BILIRUBINUR, KETONESUR, PROTEINUR, UROBILINOGEN, NITRITE, LEUKOCYTESUR,  in the last 168 hours  Lipid Panel:     Component Value Date/Time   CHOL 277* 08/12/2011 1039   TRIG 348.0* 08/12/2011 1039   HDL 39.50 08/12/2011 1039   CHOLHDL 7 08/12/2011 1039   VLDL 69.6* 08/12/2011 1039    HgbA1C:  No results found for this basename: HGBA1C    Urine Drug Screen:   No results found for this basename: labopia, cocainscrnur, labbenz, amphetmu, thcu, labbarb    Alcohol Level: No results found for this basename: ETH,  in the last 168 hours  Other results: EKG: Sinus rhythm rate 62 beats per minute.  Imaging:  CT HEAD WITHOUT CONTRAST  IMPRESSION:   No acute finding. Stable compared to prior exam.  Delton See PA-C [facility] Pager [phone] 04/26/2012, 3:04 PM  Patient seen and examined.  Clinical course and management discussed.  Necessary edits performed.  I agree with the above.  Assessment and plan of care developed and discussed below.    Assessment: 57 year old male with a history of ICH who today was noted to have a seizure at home and then another in transit.  Was given Versed and is now quite lethargic.  While en route was felt to have right sided weakness and a code stroke was called.  Patient has a history of a left ICH in 2004.  Was not a tPA candidate secondary to this history.  With history of left  ICH, current right sided weakness noted consistent with a Todd's paralysis.  Current CT of the head reviewed and shows encephalomalacia related to old hemorrhage but no acute changes.    Plan: 1.  Dilantin 1000mg  IV now with maintenance of 100mg  IV q 8hours.  May change to po once more alert.   2.  EEG 3.  MRI of the brain without contrast 4.  Seizure precautions 5.  Frequent neuro checks 6.  Dilantin level in AM  Case discussed with Dr. Jeralene Huff, MD [facility] [phone]  04/26/2012  3:37 PM

## 2012-04-26 NOTE — Code Documentation (Signed)
Code stroke called, patient was outside with brother in law when he complained of left arm pain and had seizure for about 3 minutes prior to EMS and en route to [facility] patient had another seizure lasting about 90 seconds in which he received 5 mg IV versed.  In truck EMS states patients right side was weaker. Upon arrival to [facility] patient unresponsive to painful stimuli, NIHSS unobtainable.

## 2012-04-27 ENCOUNTER — Inpatient Hospital Stay (HOSPITAL_COMMUNITY): Payer: Medicare Other

## 2012-04-27 DIAGNOSIS — I619 Nontraumatic intracerebral hemorrhage, unspecified: Secondary | ICD-10-CM

## 2012-04-27 DIAGNOSIS — F09 Unspecified mental disorder due to known physiological condition: Secondary | ICD-10-CM

## 2012-04-27 LAB — COMPREHENSIVE METABOLIC PANEL
AST: 26 U/L (ref 0–37)
Albumin: 3.6 g/dL (ref 3.5–5.2)
Alkaline Phosphatase: 57 U/L (ref 39–117)
Chloride: 102 mEq/L (ref 96–112)
Creatinine, Ser: 1.16 mg/dL (ref 0.50–1.35)
Potassium: 3.5 mEq/L (ref 3.5–5.1)
Total Bilirubin: 0.5 mg/dL (ref 0.3–1.2)
Total Protein: 6.7 g/dL (ref 6.0–8.3)

## 2012-04-27 LAB — GLUCOSE, CAPILLARY: Glucose-Capillary: 124 mg/dL — ABNORMAL HIGH (ref 70–99)

## 2012-04-27 LAB — CBC
MCHC: 34.7 g/dL (ref 30.0–36.0)
Platelets: 176 10*3/uL (ref 150–400)
RDW: 12.9 % (ref 11.5–15.5)
WBC: 10.2 10*3/uL (ref 4.0–10.5)

## 2012-04-27 LAB — PHENYTOIN LEVEL, TOTAL: Phenytoin Lvl: 11 ug/mL (ref 10.0–20.0)

## 2012-04-27 LAB — TSH: TSH: 0.665 u[IU]/mL (ref 0.350–4.500)

## 2012-04-27 LAB — POCT I-STAT TROPONIN I

## 2012-04-27 NOTE — Progress Notes (Signed)
EEG completed.

## 2012-04-27 NOTE — Evaluation (Signed)
Physical Therapy Evaluation Patient Details Name: [name] Cruz MRN: [id number] DOB: Oct 02, 1955 Today's Date: 04/27/2012 Time: 4098-1191 PT Time Calculation (min): 32 min  PT Assessment / Plan / Recommendation Clinical Impression  57 y/o male with h/o CVA and cognitive impairment, adm. for seizures. Per patient and pt's sister he is at baseline with regards to cognition. Demonstrating higher level balance deficits. Rec. HHPT (pending progress) and home health safety evaluation.     PT Assessment  Patient needs continued PT services    Follow Up Recommendations  Home health PT;Supervision - Intermittent; Home Health safety evaluation    Does the patient have the potential to tolerate intense rehabilitation      Barriers to Discharge        Equipment Recommendations  None recommended by PT    Recommendations for Other Services     Frequency Min 3X/week    Precautions / Restrictions Precautions Precautions: Fall Restrictions Weight Bearing Restrictions: No   Pertinent Vitals/Pain Denies pain      Mobility  Bed Mobility Bed Mobility: Supine to Sit Supine to Sit: 6: Modified independent (Device/Increase time) Transfers Transfers: Sit to Stand;Stand to Sit Sit to Stand: 6: Modified independent (Device/Increase time);From bed Stand to Sit: 6: Modified independent (Device/Increase time);To chair/3-in-1 Ambulation/Gait Ambulation/Gait Assistance: 5: Supervision Ambulation Distance (Feet): 500 Feet Assistive device: None Ambulation/Gait Assistance Details: gaurding for safety especially with higher level challenges (head turns) Gait Pattern: Step-through pattern Gait velocity: WFL General Gait Details: increased lateral sway, staggers with higher challenging activities, see DGI Stairs: Yes Stairs Assistance: 5: Supervision Stairs Assistance Details (indicate cue type and reason): supervision for safety Stair Management Technique: No rails    Exercises     PT  Diagnosis: Abnormality of gait  PT Problem List: Decreased balance;Decreased cognition PT Treatment Interventions: Gait training;Balance training;Therapeutic exercise;Neuromuscular re-education;Patient/family education;Stair training;Cognitive remediation   PT Goals Acute Rehab PT Goals PT Goal Formulation: With patient Time For Goal Achievement: 05/04/12 Potential to Achieve Goals: Good Pt will Ambulate: >150 feet;Independently PT Goal: Ambulate - Progress: Goal set today Additional Goals Additional Goal #1: Pt will demonstrate decreased risk of falls with DGI >/= 22/24 PT Goal: Additional Goal #1 - Progress: Goal set today  Visit Information  Last PT Received On: 04/27/12 Assistance Needed: +1    Subjective Data  Subjective: I can't comprehend. Patient Stated Goal: home   Prior Functioning  Home Living Lives With: Alone Available Help at Discharge: Family;Available PRN/intermittently Type of Home: Mobile home Home Access: Stairs to enter Entrance Stairs-Number of Steps: 4 Entrance Stairs-Rails: Can reach both;Right;Left Home Layout: One level Bathroom Shower/Tub: Engineer, manufacturing systems: Standard Home Adaptive Equipment: None Prior Function Level of Independence: Independent Able to Take Stairs?: Yes Driving: No Vocation: On disability Communication Communication: No difficulties    Cognition  Cognition Overall Cognitive Status: History of cognitive impairments - at baseline Arousal/Alertness: Awake/alert Orientation Level: Appears intact for tasks assessed Behavior During Session: [facility] for tasks performed    Extremity/Trunk Assessment Right Upper Extremity Assessment RUE ROM/Strength/Tone: [facility] for tasks assessed Left Upper Extremity Assessment LUE ROM/Strength/Tone: WFL for tasks assessed Right Lower Extremity Assessment RLE ROM/Strength/Tone: [facility] for tasks assessed Left Lower Extremity Assessment LLE ROM/Strength/Tone: [facility] for tasks assessed    Balance Standardized Balance Assessment Standardized Balance Assessment: Dynamic Gait Index Dynamic Gait Index Level Surface: Mild Impairment Change in Gait Speed: Normal Gait with Horizontal Head Turns: Mild Impairment Gait with Vertical Head Turns: Normal Gait and Pivot Turn: Mild Impairment Step Over Obstacle:  Mild Impairment Step Around Obstacles: Mild Impairment Steps: Normal Total Score: 19 High Level Balance High Level Balance Activites: Backward walking High Level Balance Comments: mingaurdA for backward walking  End of Session PT - End of Session Equipment Utilized During Treatment: Gait belt Activity Tolerance: Patient tolerated treatment well Patient left: in chair;with call bell/phone within reach Nurse Communication: Mobility status  GP     [facility] HELEN 04/27/2012, 9:49 AM

## 2012-04-27 NOTE — Progress Notes (Signed)
Subjective: Patient awake and alert.  Has some difficulty with speech that he reports he had with his stroke iniatlly but it improved.  Is otherwise without complaints.    Objective: Current vital signs: BP 128/75  Pulse 72  Temp(Src) 97.8 F (36.6 C) (Oral)  Resp 20  Ht 5\' 4"  (1.626 m)  Wt 86.9 kg (191 lb 9.3 oz)  BMI 32.87 kg/m2  SpO2 100% Vital signs in last 24 hours: Temp:  [97.8 F (36.6 C)-98.4 F (36.9 C)] 97.8 F (36.6 C) (04/14 1028) Pulse Rate:  [63-120] 72 (04/14 1028) Resp:  [13-25] 20 (04/14 1028) BP: (92-141)/(57-87) 128/75 mmHg (04/14 1028) SpO2:  [91 %-100 %] 100 % (04/14 1028) Weight:  [86.9 kg (191 lb 9.3 oz)] 86.9 kg (191 lb 9.3 oz) (04/13 1700)  Intake/Output from previous day: 04/13 0701 - 04/14 0700 In: -  Out: 2500 [Urine:2500] Intake/Output this shift: Total I/O In: 240 [P.O.:240] Out: -  Nutritional status: Cardiac  Neurologic Exam: Mental Status:  Awake and alert.  Follows 3-step commands.  Speech not fluent. Cranial Nerves:  II: VF's grossly intact bilaterally, pupils right 3 mm, left 3 mm,and reactive bilaterally  III,IV,VI: EOM's intact  V,VII: Smile symmetric VIII: grossly intact IX,X: gag reflex intact, XI: trapezius strength unable to test bilaterally  XII: tongue strength unable to test  Motor:  Strength 5/5 Sensory:  Reports appreciation of light touch throughout Deep Tendon Reflexes:  1+ in the upper extremities, absent in the lower extremities  Plantars:  Mute bilaterally   Lab Results: Basic Metabolic Panel:  Recent Labs Lab 04/26/12 1411 04/26/12 1421 04/27/12 0450  NA 143 143 139  K 3.4* 3.4* 3.5  CL 108 98 102  CO2  --  13* 27  GLUCOSE 194* 194* 116*  BUN 20 19 19   CREATININE 1.10 1.14 1.16  CALCIUM  --  10.1 9.0    Liver Function Tests:  Recent Labs Lab 04/26/12 1421 04/27/12 0450  AST 28 26  ALT 19 14  ALKPHOS 68 57  BILITOT 0.4 0.5  PROT 8.1 6.7  ALBUMIN 4.5 3.6   No results found for this  basename: LIPASE, AMYLASE,  in the last 168 hours No results found for this basename: AMMONIA,  in the last 168 hours  CBC:  Recent Labs Lab 04/26/12 1411 04/26/12 1421 04/27/12 0450  WBC  --  15.1* 10.2  NEUTROABS  --  8.3*  --   HGB 15.6 14.9 13.0  HCT 46.0 43.8 37.5*  MCV  --  92.6 86.8  PLT  --  292 176    Cardiac Enzymes:  Recent Labs Lab 04/26/12 1422  TROPONINI <0.30    Lipid Panel: No results found for this basename: CHOL, TRIG, HDL, CHOLHDL, VLDL, LDLCALC,  in the last 168 hours  CBG:  Recent Labs Lab 04/26/12 1449  GLUCAP 167*    Microbiology: No results found for this or any previous visit.  Coagulation Studies:  Recent Labs  04/26/12 1421  LABPROT 14.1  INR 1.10    Imaging: Ct Head Wo Contrast  04/26/2012  *RADIOLOGY REPORT*  Clinical Data: Code stroke.  Seizure.  Right arm weakness and disorientation.  CT HEAD WITHOUT CONTRAST  Technique:  Contiguous axial images were obtained from the base of the skull through the vertex without contrast.  Comparison: Head CT scan 06/27/2011.  Findings: Ex vacuo dilatation of the left lateral ventricle, greatest in the posterior horn, with a small calcification in the periphery the posterior horn is  again seen.  No evidence of acute abnormality including infarct, hemorrhage, mass lesion, mass effect, midline shift or abnormal extra-axial fluid collection. Mild periventricular white matter hypointensity is unchanged.  The calvarium is intact.  IMPRESSION: No acute finding.  Stable compared to prior exam.   Original Report Authenticated By: Holley Dexter, M.D.     Medications:  I have reviewed the patient's current medications. Scheduled: . aspirin EC  81 mg Oral Daily  . atorvastatin  40 mg Oral q1800  . folic acid  1 mg Oral Daily  . fosPHENYtoin (CEREBYX) IV  100 mg PE Intravenous Q8H  . heparin  5,000 Units Subcutaneous Q8H  . hydrochlorothiazide  12.5 mg Oral Daily  . lisinopril  20 mg Oral Daily  .  multivitamin with minerals  1 tablet Oral Daily  . sodium chloride  3 mL Intravenous Q12H  . thiamine  100 mg Oral Daily   Or  . thiamine  100 mg Intravenous Daily    Assessment/Plan: 57 year old male with new onset seizures.  Has a history of a left hemorrhagic infarct.  Now with speech deficits-likely a post seizure phenomenon.  Expect improvement.  Patient on Dilantin with a level of 11.0.  No side effects reported.  Recommendations: 1.  EEG today. 2.  MRI of the brain pending 3.  Continue Dilantin at current dose.  Would change to po once patient able to take po.      LOS: 1 day   Thana Farr, MD [facility] [phone] 04/27/2012  11:47 AM

## 2012-04-27 NOTE — Progress Notes (Signed)
Patient ID: [name] Cruz  male  EAV:[id number]    DOB: [birth year]    DOA: 04/26/2012  PCP: [name], MD  Assessment/Plan: Principal Problem:   Seizures: No repeat seizures since admission - Currently on Dilantin, MRI brain, EEG today - Neurology following, serial neurochecks  Active Problems:   Stroke, hemorrhagic: Patient had recovered from his prior left hemorrhagic infarct in 2007 with residual cognitive impairment.    HLD (hyperlipidemia): - On Lipitor    HTN (hypertension): - Continue HCTZ, lisinopril    ETOH abuse - Continue CIWA protocol, thiamine, folic acid  DVT Prophylaxis: Heparin subcutaneous  Code Status: Full CODE STATUS  Disposition:    Subjective: At the time of my examination, patient was eating breakfast with no difficulty, no repeat seizure since admission  Objective: Weight change:   Intake/Output Summary (Last 24 hours) at 04/27/12 1212 Last data filed at 04/27/12 0900  Gross per 24 hour  Intake    240 ml  Output   2500 ml  Net  -2260 ml   Blood pressure 128/75, pulse 72, temperature 97.8 F (36.6 C), temperature source Oral, resp. rate 20, height 5\' 4"  (1.626 m), weight 86.9 kg (191 lb 9.3 oz), SpO2 100.00%.  Physical Exam: General: Alert and awake, oriented  not in any acute distress. CVS: S1-S2 clear, no murmur rubs or gallops Chest: clear to auscultation bilaterally, no wheezing, rales or rhonchi Abdomen: soft nontender, nondistended, normal bowel sounds Extremities: no cyanosis, clubbing or edema noted bilaterally  Lab Results: Basic Metabolic Panel:  Recent Labs Lab 04/26/12 1421 04/27/12 0450  NA 143 139  K 3.4* 3.5  CL 98 102  CO2 13* 27  GLUCOSE 194* 116*  BUN 19 19  CREATININE 1.14 1.16  CALCIUM 10.1 9.0   Liver Function Tests:  Recent Labs Lab 04/26/12 1421 04/27/12 0450  AST 28 26  ALT 19 14  ALKPHOS 68 57  BILITOT 0.4 0.5  PROT 8.1 6.7  ALBUMIN 4.5 3.6   No results found for this basename:  LIPASE, AMYLASE,  in the last 168 hours No results found for this basename: AMMONIA,  in the last 168 hours CBC:  Recent Labs Lab 04/26/12 1421 04/27/12 0450  WBC 15.1* 10.2  NEUTROABS 8.3*  --   HGB 14.9 13.0  HCT 43.8 37.5*  MCV 92.6 86.8  PLT 292 176   Cardiac Enzymes:  Recent Labs Lab 04/26/12 1422  TROPONINI <0.30   BNP: No components found with this basename: POCBNP,  CBG:  Recent Labs Lab 04/26/12 1449  GLUCAP 167*     Micro Results: No results found for this or any previous visit (from the past 240 hour(s)).  Studies/Results: Ct Head Wo Contrast  04/26/2012  *RADIOLOGY REPORT*  Clinical Data: Code stroke.  Seizure.  Right arm weakness and disorientation.  CT HEAD WITHOUT CONTRAST  Technique:  Contiguous axial images were obtained from the base of the skull through the vertex without contrast.  Comparison: Head CT scan 06/27/2011.  Findings: Ex vacuo dilatation of the left lateral ventricle, greatest in the posterior horn, with a small calcification in the periphery the posterior horn is again seen.  No evidence of acute abnormality including infarct, hemorrhage, mass lesion, mass effect, midline shift or abnormal extra-axial fluid collection. Mild periventricular white matter hypointensity is unchanged.  The calvarium is intact.  IMPRESSION: No acute finding.  Stable compared to prior exam.   Original Report Authenticated By: Holley Dexter, M.D.     Medications: Scheduled Meds: .  aspirin EC  81 mg Oral Daily  . atorvastatin  40 mg Oral q1800  . folic acid  1 mg Oral Daily  . fosPHENYtoin (CEREBYX) IV  100 mg PE Intravenous Q8H  . heparin  5,000 Units Subcutaneous Q8H  . hydrochlorothiazide  12.5 mg Oral Daily  . lisinopril  20 mg Oral Daily  . multivitamin with minerals  1 tablet Oral Daily  . sodium chloride  3 mL Intravenous Q12H  . thiamine  100 mg Oral Daily   Or  . thiamine  100 mg Intravenous Daily      LOS: 1 day   Nathanel Tallman M.D. [facility] 04/27/2012, 12:12 PM Pager: [phone]  If 7PM-7AM, please contact night-coverage [email or web address] Password [email or web address]

## 2012-04-28 MED ORDER — ADULT MULTIVITAMIN W/MINERALS CH: 1.0000 | ORAL_TABLET | Freq: Every day | ORAL | Status: DC

## 2012-04-28 MED ORDER — PHENYTOIN SODIUM EXTENDED 100 MG PO CAPS
100.0000 mg | ORAL_CAPSULE | Freq: Three times a day (TID) | ORAL | Status: DC
  Administered 2012-04-28: 100 mg via ORAL
  Filled 2012-04-28 (×4): qty 1

## 2012-04-28 MED ORDER — THIAMINE HCL 100 MG PO TABS: 100.0000 mg | ORAL_TABLET | Freq: Every day | ORAL | Status: DC

## 2012-04-28 MED ORDER — PHENYTOIN SODIUM EXTENDED 100 MG PO CAPS: 100.0000 mg | ORAL_CAPSULE | Freq: Three times a day (TID) | ORAL | Status: DC

## 2012-04-28 NOTE — Care Management Note (Signed)
    Page 1 of 1   04/28/2012     11:44:18 AM   CARE MANAGEMENT NOTE 04/28/2012  Patient:  JERRETT, BALDINGER   Account Number:  [id number]  Date Initiated:  04/27/2012  Documentation initiated by:  Jacquelynn Cree  Subjective/Objective Assessment:   admitted with seizures  lives alone     Action/Plan:   PT/OT evals-recommending HHPT and HHOT   Anticipated DC Date:  04/30/2012   Anticipated DC Plan:  HOME W HOME HEALTH SERVICES      DC Planning Services  CM consult      Choice offered to / List presented to:  C-1 Patient        HH arranged  HH-2 PT  HH-3 OT      [facility] agency  [facility]   Status of service:  Completed, signed off Medicare Important Message given?   (If response is "NO", the following Medicare IM given date fields will be blank) Date Medicare IM given:   Date Additional Medicare IM given:    Discharge Disposition:  HOME W HOME HEALTH SERVICES  Per UR Regulation:  Reviewed for med. necessity/level of care/duration of stay  If discussed at Long Length of Stay Meetings, dates discussed:    Comments:  04/28/12 Spoke with patient about HHC. He chose [facility] from the [facility] agencies list. Dava Najjar with [facility] and set up HHPT and HHOT. No equipment needs identified. Jacquelynn Cree RN, BSN, CCM

## 2012-04-28 NOTE — Progress Notes (Addendum)
NEURO HOSPITALIST PROGRESS NOTE   SUBJECTIVE:                                                                                                                        No further seizures. No complaints at this time. He feels he is doing well and has no SE on Dilantin.   OBJECTIVE:                                                                                                                           Vital signs in last 24 hours: Temp:  [97.8 F (36.6 C)-98.8 F (37.1 C)] 98.1 F (36.7 C) (04/15 0626) Pulse Rate:  [70-88] 71 (04/15 0626) Resp:  [18-20] 18 (04/15 0626) BP: (103-128)/(63-75) 110/65 mmHg (04/15 0626) SpO2:  [96 %-100 %] 98 % (04/15 0626)  Intake/Output from previous day: 04/14 0701 - 04/15 0700 In: 240 [P.O.:240] Out: 750 [Urine:750] Intake/Output this shift: Total I/O In: 320 [P.O.:320] Out: -  Nutritional status: Cardiac  Past Medical History  Diagnosis Date  . Stroke, hemorrhagic 2007    thought 2/2 HTN (240sbp); residual cognitive impairment, vision problem, no driving  . Cognitive impairment 2007    after stroke, saw rehab but told to stop because was too upsetting to him  . History of chicken pox   . HLD (hyperlipidemia)   . HTN (hypertension)   . Obesity   . Stroke      Neurologic Exam:  Mental Status: Alert, oriented, thought content appropriate.  Speech fluent without evidence of aphasia.  Able to follow 3 step commands without difficulty. Cranial Nerves: II: Discs flat bilaterally; Visual fields grossly normal, pupils equal, round, reactive to light and accommodation III,IV, VI: ptosis not present, extra-ocular motions intact bilaterally V,VII: smile symmetric, facial light touch sensation normal bilaterally VIII: hearing normal bilaterally IX,X: gag reflex present XI: bilateral shoulder shrug XII: midline tongue extension Motor: Right : Upper extremity   5/5    Left:     Upper extremity   5/5  Lower  extremity   5/5     Lower extremity   5/5 Tone and bulk:normal tone throughout; no atrophy noted Sensory: Pinprick and light touch intact throughout, bilaterally Deep Tendon Reflexes: depressed in  the UE 1+ bilaterally and no KJ or AJ noted.  Plantars: Mute bilaterally Cerebellar: normal finger-to-nose,  normal heel-to-shin test CV: pulses palpable throughout    Lab Results: Lab Results  Component Value Date/Time   CHOL 277* 08/12/2011 10:39 AM   Lipid Panel No results found for this basename: CHOL, TRIG, HDL, CHOLHDL, VLDL, LDLCALC,  in the last 72 hours  Studies/Results: Ct Head Wo Contrast  04/26/2012  *RADIOLOGY REPORT*  Clinical Data: Code stroke.  Seizure.  Right arm weakness and disorientation.  CT HEAD WITHOUT CONTRAST  Technique:  Contiguous axial images were obtained from the base of the skull through the vertex without contrast.  Comparison: Head CT scan 06/27/2011.  Findings: Ex vacuo dilatation of the left lateral ventricle, greatest in the posterior horn, with a small calcification in the periphery the posterior horn is again seen.  No evidence of acute abnormality including infarct, hemorrhage, mass lesion, mass effect, midline shift or abnormal extra-axial fluid collection. Mild periventricular white matter hypointensity is unchanged.  The calvarium is intact.  IMPRESSION: No acute finding.  Stable compared to prior exam.   Original Report Authenticated By: Holley Dexter, M.D.    Mr Brain Wo Contrast  04/27/2012  *RADIOLOGY REPORT*  Clinical Data: Hemorrhagic stroke 2007 and with cognitive impairment.  Hypertensive hyperlipidemic patient.  History of alcohol consumption.  Presents with seizure.  MRI HEAD WITHOUT CONTRAST  Technique:  Multiplanar, multiecho pulse sequences of the brain and surrounding structures were obtained according to standard protocol without intravenous contrast.  Comparison: 04/26/2012 CT.  No comparison MR.  Findings:  No acute infarct.   Encephalomalacia left periatrial/temporal region with hemosiderin breakdown products and subsequent dilation left temporal horn/atrium of the lateral ventricle.  Findings consistent with provided history of prior hemorrhagic stroke.  Scattered tiny region of blood breakdown products most likely related to the prior episodes hemorrhagic ischemia.  No other findings of intracranial hemorrhage.  Moderate small vessel disease type changes.  No intracranial mass lesion detected on this unenhanced exam.  Major intracranial vascular structures are patent.  Polypoid opacification inferior aspect left maxillary sinus.  IMPRESSION: No acute infarct.  Please see above.   Original Report Authenticated By: Lacy Duverney, M.D.     MEDICATIONS                                                                                                                        Scheduled: . aspirin EC  81 mg Oral Daily  . atorvastatin  40 mg Oral q1800  . folic acid  1 mg Oral Daily  . fosPHENYtoin (CEREBYX) IV  100 mg PE Intravenous Q8H  . heparin  5,000 Units Subcutaneous Q8H  . hydrochlorothiazide  12.5 mg Oral Daily  . lisinopril  20 mg Oral Daily  . multivitamin with minerals  1 tablet Oral Daily  . sodium chloride  3 mL Intravenous Q12H  . thiamine  100 mg Oral Daily   Or  . thiamine  100 mg  Intravenous Daily    ASSESSMENT/PLAN:                                                                                                             57 YO male with new onset seizure.  Patient now speaking clearly, having no further seizure activity.  Most recent Dilantin level is 11.0. MRI shows encephalomalacia left periatrial/temporal region with hemosiderin breakdown products and subsequent dilation left temporal horn/atrium of the lateral ventricle consistent with prior hemorrhagic stroke and no acute infarct.  EEG shows no epileptiform activity.   Plan: 1) Will change patient to PO Dilantin as he is taking oral medications with  no difficulty. Patient will need to continue current dose at time of discharge and follow up with out patient neurology. [facility] [address] [place], [place] [address]--[phone]  Neurology will S/O   Assessment and plan discussed with with attending physician and they are in agreement.    Felicie Morn PA-C [facility] [phone]  04/28/2012, 8:33 AM

## 2012-04-28 NOTE — Progress Notes (Signed)
Occupational Therapy Evaluation Patient Details Name: CHICO CAWOOD MRN: [id number] DOB: [birth year] Today's Date: 04/28/2012 Time: 1140-     OT Assessment / Plan / Recommendation Clinical Impression  57 yo s/p CVA 2007. Pt with recent seizures. Pt currently functional at baseline level. OK to D/C from OT standpoint. No further OT needed. will have intermittent S at home.    OT Assessment  Patient does not need any further OT services    Follow Up Recommendations  No OT follow up    Barriers to Discharge      Equipment Recommendations  None recommended by OT    Recommendations for Other Services    Frequency       Precautions / Restrictions Precautions Precautions: Fall   Pertinent Vitals/Pain no apparent distress     ADL  ADL Comments: overall mod I with all ADL and mobility    OT Diagnosis:    OT Problem List:   OT Treatment Interventions:     OT Goals Acute Rehab OT Goals OT Goal Formulation:  (eval only)  Visit Information  Last OT Received On: 04/21/12 Assistance Needed: +1    Subjective Data      Prior Functioning     Home Living Lives With: Alone Available Help at Discharge: Family;Available PRN/intermittently Type of Home: Mobile home Home Access: Stairs to enter Entrance Stairs-Number of Steps: 4 Entrance Stairs-Rails: Can reach both;Right;Left Home Layout: One level Bathroom Shower/Tub: Engineer, manufacturing systems: Standard Bathroom Accessibility: Yes How Accessible: Accessible via walker Home Adaptive Equipment: None;Walker - rolling Prior Function Level of Independence: Independent Able to Take Stairs?: Yes Driving: No Vocation: On disability Communication Communication: Expressive difficulties (from previous stroke)         Vision/Perception Vision - History Baseline Vision: No visual deficits   Cognition  Cognition Overall Cognitive Status: History of cognitive impairments - at baseline    Extremity/Trunk  Assessment Right Upper Extremity Assessment RUE ROM/Strength/Tone: [facility] for tasks assessed Left Upper Extremity Assessment LUE ROM/Strength/Tone: WFL for tasks assessed Right Lower Extremity Assessment RLE ROM/Strength/Tone: [facility] for tasks assessed Left Lower Extremity Assessment LLE ROM/Strength/Tone: [facility] for tasks assessed     Mobility Bed Mobility Bed Mobility: Supine to Sit;Sit to Supine Supine to Sit: 6: Modified independent (Device/Increase time) Sit to Supine: 6: Modified independent (Device/Increase time) Transfers Transfers: Sit to Stand;Stand to Sit Sit to Stand: 6: Modified independent (Device/Increase time) Stand to Sit: 6: Modified independent (Device/Increase time)     Exercise     Balance  mod I   End of Session OT - End of Session Activity Tolerance: Patient tolerated treatment well Patient left: in chair;with call bell/phone within reach Nurse Communication: Mobility status  GO     Brighton Delio,HILLARY 04/28/2012, 1:32 PM [facility], OTR/L  [phone] 04/28/2012

## 2012-04-28 NOTE — Discharge Summary (Signed)
Physician Discharge Summary  Patient ID: [name] Cruz MRN: [id number] DOB/AGE: 1955-06-23 57 y.o.  Admit date: 04/26/2012 Discharge date: 04/28/2012  Primary Care Physician:  [name], MD  Discharge Diagnoses:   . HTN (hypertension) . HLD (hyperlipidemia) . Seizures . ETOH abuse . History of Stroke, hemorrhagic  Consults: Neurology, Dr. Thad Ranger   Discharge Medications:   Medication List    TAKE these medications       aspirin EC 81 MG tablet  Take 81 mg by mouth daily.     atorvastatin 40 MG tablet  Commonly known as:  LIPITOR  Take 1 tablet (40 mg total) by mouth daily.     lisinopril-hydrochlorothiazide 20-12.5 MG per tablet  Commonly known as:  ZESTORETIC  Take 1 tablet by mouth daily.     multivitamin with minerals Tabs  Take 1 tablet by mouth daily.     phenytoin 100 MG ER capsule  Commonly known as:  DILANTIN  Take 1 capsule (100 mg total) by mouth 3 (three) times daily.     thiamine 100 MG tablet  Take 1 tablet (100 mg total) by mouth daily.         Brief H and P: For complete details please refer to admission H and P, but in brief [name] Cruz is a 57 y.o. male  Past medical history of hemorrhagic stroke in 2007 with residual cognitive impairment, also a significant past medical history of alcohol consumption comes in with seizure. On the day of admission, he was working with his brother-in-law around 79 PM when he started complaining about abnormal movements of his left knee and developed into grand mal seizures. EMS got to the scene and he was post ictal the patient had another seizure while in route he was given 5 of Versed. The patient was postictal at the time of admission and was admitted for further workup.   Hospital Course:  Patient presented with new-onset seizures, has a history of left hemorrhagic infarct. Patient had some speech deficits likely from post seizure phenomena which has improved at the time of discharge. Seizures: No  repeat seizures since admission. Patient was placed on IV Dilantin loading dose 1000mg  followed by 100mg  TID. MRI of the brain was obtained which showed no acute infarct, he does have encephalomalacia left periatrial/temporal region with hemosiderin breakdown products and subsequent dilation left temporal horn/atrium of the lateral ventricle. EEG was done a nd showed:  abnormal EEG secondary to left hemispheric slowing. This is consistent with the patient's history of a left  hemispheric hemorrhagic stroke. No epileptiform activity was noted.  Neurology recommended Dilantin 100mg TID at discharge and follow- up with Neurology. Patient was recommended to follow up with [facility] neurology in 2 weeks.  Stroke, hemorrhagic: Patient had recovered from his prior left hemorrhagic infarct in 2007 with residual cognitive impairment.   HLD (hyperlipidemia):  - On Lipitor  HTN (hypertension):  - Continue HCTZ, lisinopril   ETOH abuse : Patient had no withdrawal state during the hospitalization he was observed on CIWA protocol, thiamine, folic acid.   Day of Discharge BP 144/92  Pulse 96  Temp(Src) 98.4 F (36.9 C) (Oral)  Resp 18  Ht 5\' 4"  (1.626 m)  Wt 86.9 kg (191 lb 9.3 oz)  BMI 32.87 kg/m2  SpO2 99%  Physical Exam: General: Alert and awake oriented x3 not in any acute distress. CVS: S1-S2 clear no murmur rubs or gallops Chest: clear to auscultation bilaterally, no wheezing rales or rhonchi Abdomen: soft nontender,  nondistended, normal bowel sounds Extremities: no cyanosis, clubbing or edema noted bilaterally    The results of significant diagnostics from this hospitalization (including imaging, microbiology, ancillary and laboratory) are listed below for reference.    LAB RESULTS: Basic Metabolic Panel:  Recent Labs Lab 04/26/12 1421 04/27/12 0450  NA 143 139  K 3.4* 3.5  CL 98 102  CO2 13* 27  GLUCOSE 194* 116*  BUN 19 19  CREATININE 1.14 1.16  CALCIUM 10.1 9.0    Liver Function Tests:  Recent Labs Lab 04/26/12 1421 04/27/12 0450  AST 28 26  ALT 19 14  ALKPHOS 68 57  BILITOT 0.4 0.5  PROT 8.1 6.7  ALBUMIN 4.5 3.6   No results found for this basename: LIPASE, AMYLASE,  in the last 168 hours No results found for this basename: AMMONIA,  in the last 168 hours CBC:  Recent Labs Lab 04/26/12 1421 04/27/12 0450  WBC 15.1* 10.2  NEUTROABS 8.3*  --   HGB 14.9 13.0  HCT 43.8 37.5*  MCV 92.6 86.8  PLT 292 176   Cardiac Enzymes:  Recent Labs Lab 04/26/12 1422  TROPONINI <0.30   BNP: No components found with this basename: POCBNP,  CBG:  Recent Labs Lab 04/27/12 0645 04/28/12 0626  GLUCAP 124* 91    Significant Diagnostic Studies:  Ct Head Wo Contrast  04/26/2012  *RADIOLOGY REPORT*  Clinical Data: Code stroke.  Seizure.  Right arm weakness and disorientation.  CT HEAD WITHOUT CONTRAST  Technique:  Contiguous axial images were obtained from the base of the skull through the vertex without contrast.  Comparison: Head CT scan 06/27/2011.  Findings: Ex vacuo dilatation of the left lateral ventricle, greatest in the posterior horn, with a small calcification in the periphery the posterior horn is again seen.  No evidence of acute abnormality including infarct, hemorrhage, mass lesion, mass effect, midline shift or abnormal extra-axial fluid collection. Mild periventricular white matter hypointensity is unchanged.  The calvarium is intact.  IMPRESSION: No acute finding.  Stable compared to prior exam.   Original Report Authenticated By: Holley Dexter, M.D.    Mr Brain Wo Contrast  04/27/2012  *RADIOLOGY REPORT*  Clinical Data: Hemorrhagic stroke 2007 and with cognitive impairment.  Hypertensive hyperlipidemic patient.  History of alcohol consumption.  Presents with seizure.  MRI HEAD WITHOUT CONTRAST  Technique:  Multiplanar, multiecho pulse sequences of the brain and surrounding structures were obtained according to standard protocol  without intravenous contrast.  Comparison: 04/26/2012 CT.  No comparison MR.  Findings:  No acute infarct.  Encephalomalacia left periatrial/temporal region with hemosiderin breakdown products and subsequent dilation left temporal horn/atrium of the lateral ventricle.  Findings consistent with provided history of prior hemorrhagic stroke.  Scattered tiny region of blood breakdown products most likely related to the prior episodes hemorrhagic ischemia.  No other findings of intracranial hemorrhage.  Moderate small vessel disease type changes.  No intracranial mass lesion detected on this unenhanced exam.  Major intracranial vascular structures are patent.  Polypoid opacification inferior aspect left maxillary sinus.  IMPRESSION: No acute infarct.  Please see above.   Original Report Authenticated By: Lacy Duverney, M.D.       Disposition and Follow-up:     Discharge Orders   Future Orders Complete By Expires     Ambulatory referral to Neurology  As directed     Diet - low sodium heart healthy  As directed     Increase activity slowly  As directed  DISPOSITION: Home DIET: Heart healthy diet ACTIVITY: As tolerated  DISCHARGE FOLLOW-UP Follow-up Information   Follow up with [name], MD. Schedule an appointment as soon as possible for a visit in 10 days. (for hospital follow-up)    Contact information:   [address] [place] [address] [phone]       Follow up with Joycelyn Schmid, MD. Schedule an appointment as soon as possible for a visit in 2 weeks. (please call for appointment and talk to Billing Dept )    Contact information:   [address] [place] [address] [phone]       Time spent on Discharge: 35 mins  Signed:   Denisse Whitenack M.D. [facility] 04/28/2012, 1:19 PM Pager: [phone]

## 2012-04-29 NOTE — Procedures (Signed)
EEG NUMBER:  REFERRING PHYSICIAN:  Dr. Robb Matar.  HISTORY:  A 57 year old male presenting with seizure and history of hemorrhagic stroke.  MEDICATIONS:  Aspirin, Lipitor, Folvite, Celebrex, Microzide, Prinivil, Ativan, Zofran, MiraLAX.  CONDITIONS OF RECORDING:  This is a 16-channel EEG carried out with the patient in the awake, drowsy, and asleep states.  DESCRIPTION:  The waking background activity consists of a low-voltage, fairly well-organized 9-10 Hz alpha activity seen from the parieto- occipital and posterotemporal regions.  Low-voltage, fast activity, poorly organized was seen and during at times, superimposed on more posterior rhythms.  A mixture of theta and alpha rhythm was seen from the central and temporal regions.  The background activity is less well- sustained over the left hemisphere.  There is noted underlying polymorphic delta rhythm that slows the rhythm over the left.  This is not noted over the right hemisphere.  The patient drowses with slowing to irregular, low-voltage theta and beta activity.  The patient goes into a light sleep with symmetrical sleep spindles, vertex with a sharp activity and irregular slow activity.  Hyperventilation was not performed.  Intermittent photic stimulation failed to elicit any change in the tracing.  IMPRESSION:  This is an abnormal EEG secondary to left hemispheric slowing.  This is consistent with the patient's history of a left hemispheric hemorrhagic stroke.  No epileptiform activity was noted.          ______________________________ Thana Farr, MD    [id number] D:  04/28/2012 08:07:13  T:  04/29/2012 01:57:14  Job #:  [id number]

## 2012-04-29 NOTE — Procedures (Unsigned)
EEG NUMBER:  HISTORY:  A 57 year old male with new-onset seizures and history of hemorrhagic stroke.  MEDICATIONS:  Aspirin, Lipitor, Folvite, Celebrex, subcu heparin, Microzide, Prinivil, Ativan, Zofran, multivitamin, MiraLax, vitamin B1.  CONDITIONS OF RECORDING:  This is a 16-channel EEG carried out with the patient in the awake and drowsy states.  DESCRIPTION:  The waking background activity consists of a low-voltage, symmetrical, fairly well-organized 9 to 10 Hz alpha activity seen from the parieto-occipital and posterotemporal regions.   CANCELED DICTATION          ______________________________ Thana Farr, MD    [id number] D:  04/28/2012 08:05:04  T:  04/29/2012 01:41:03  Job #:  [id number]

## 2012-05-03 ENCOUNTER — Telehealth: Payer: Self-pay | Admitting: Family Medicine

## 2012-05-03 NOTE — Telephone Encounter (Signed)
Noted pt discharged and has f/u scheduled with me on Tuesday.

## 2012-05-04 ENCOUNTER — Telehealth: Payer: Self-pay | Admitting: Family Medicine

## 2012-05-04 NOTE — Telephone Encounter (Signed)
Dr. Fabio Neighbors from [facility] called about the referral to him on this patient.  He said the patient is not in need of any therapy.  He can do things for himself.  Any questions, give him a call at [phone].

## 2012-05-04 NOTE — Telephone Encounter (Signed)
Ok.. This was ordered upon discharge.  HHPT was recommended along with home safety evaluation, OT signed off on pt in hospital. I don't know who Dr. Fabio Neighbors is.  Called back - I believe he is a physical therapist with [place].  Stated he discharged pt from [facility] PT as pt was mowing lawn when evaluated.  Did not do home safety evaluation. Will discuss with pt tomorrow.

## 2012-05-05 ENCOUNTER — Encounter: Payer: Self-pay | Admitting: Family Medicine

## 2012-05-05 ENCOUNTER — Ambulatory Visit (INDEPENDENT_AMBULATORY_CARE_PROVIDER_SITE_OTHER): Payer: Medicare Other | Admitting: Family Medicine

## 2012-05-05 VITALS — BP 146/100 | HR 80 | Temp 98.2°F | Ht 63.0 in | Wt 192.2 lb

## 2012-05-05 DIAGNOSIS — F1010 Alcohol abuse, uncomplicated: Secondary | ICD-10-CM

## 2012-05-05 DIAGNOSIS — I10 Essential (primary) hypertension: Secondary | ICD-10-CM

## 2012-05-05 DIAGNOSIS — E785 Hyperlipidemia, unspecified: Secondary | ICD-10-CM

## 2012-05-05 DIAGNOSIS — R569 Unspecified convulsions: Secondary | ICD-10-CM

## 2012-05-05 MED ORDER — ATORVASTATIN CALCIUM 40 MG PO TABS: 40.0000 mg | ORAL_TABLET | Freq: Every day | ORAL | Status: DC

## 2012-05-05 MED ORDER — LISINOPRIL-HYDROCHLOROTHIAZIDE 20-12.5 MG PO TABS: 1.0000 | ORAL_TABLET | Freq: Every day | ORAL | Status: DC

## 2012-05-05 NOTE — Assessment & Plan Note (Signed)
Had not been compliant with lisinopril/hctz - encouraged to restart, refilled.

## 2012-05-05 NOTE — Assessment & Plan Note (Signed)
New dx in hospital.  MRI/EEG showing abnormality in prior area of hemorrhagic stroke. I wonder how much EtOH use and nonadherence to HTN/HLD meds contributed.   Congratulated on quitting EtOH and agree with B1 supplementation. Started on phenytoin 100mg  tid, will continue. Encouraged schedule f/u appt with neuro. Recheck phenytoin level when returns for f/u in 6 mo.

## 2012-05-05 NOTE — Patient Instructions (Addendum)
I've refilled blood pressure and cholesterol meds.  Make sure to take meds daily. I'm glad you've quit drinking. Call neurologist to follow up in next few weeks after seizure (Dr. Marjory Lies) Good to see you today ,call us with questions. Return to see me in 6 months for office visit and fasting blood work.

## 2012-05-05 NOTE — Progress Notes (Signed)
Subjective:    Patient ID: [name] Cruz, male    DOB: 09-03-1955, 57 y.o.   MRN: [id number]  HPI CC: hosp f/u  [name]. [name] Cruz presents today as hospital follow up for recent admission 4/13-15/2014 after he suffered several seizures, witnessed.  Pt states he passed out prior to seizures - endorses dizziness prior to passing out.  Evaluated by neurology in hospital.  Had stable CT, EEG, MRI .  Started on phenytoin, tolerating med well. No seizure since he's been home.  HHPT did come out to house but thought he was not appropriate for HHPT because he was mowing lawn.  Home safety evaluation was ordered, not done.  Pt states he's not worried about falls at home or safety of home.  Mother agrees.  Pt continues to cook at home.  EtOH - stopped alcohol.  MRI - Encephalomalacia left periatrial/temporal region with hemosiderin breakdown products and subsequent dilation left temporal horn/atrium of the lateral ventricle. Consistent with h/o hemorrhagic stroke. EEG - abnormal EEG secondary to left hemispheric slowing. This is consistent with the patient's history of a left hemispheric hemorrhagic stroke. No epileptiform activity was noted.    Admit date: 04/26/2012  Discharge date: 04/28/2012   Discharge Diagnoses:  . HTN (hypertension) . HLD (hyperlipidemia) . Seizures . ETOH abuse . History of Stroke, hemorrhagic  Consults: Neurology, Dr. Thad Ranger   Discharge Medications:    Medication List     TAKE these medications       aspirin EC 81 MG tablet    Take 81 mg by mouth daily.    atorvastatin 40 MG tablet    Commonly known as: LIPITOR    Take 1 tablet (40 mg total) by mouth daily.    lisinopril-hydrochlorothiazide 20-12.5 MG per tablet    Commonly known as: ZESTORETIC    Take 1 tablet by mouth daily.    multivitamin with minerals Tabs    Take 1 tablet by mouth daily.    phenytoin 100 MG ER capsule    Commonly known as: DILANTIN    Take 1 capsule (100 mg total) by mouth 3 (three)  times daily.    thiamine 100 MG tablet    Take 1 tablet (100 mg total) by mouth daily.     Brief H and P:  For complete details please refer to admission H and P, but in brief [name] Cruz is a 57 y.o. male  Past medical history of hemorrhagic stroke in 2007 with residual cognitive impairment, also a significant past medical history of alcohol consumption comes in with seizure. On the day of admission, he was working with his brother-in-law around 63 PM when he started complaining about abnormal movements of his left knee and developed into grand mal seizures. EMS got to the scene and he was post ictal the patient had another seizure while in route he was given 5 of Versed. The patient was postictal at the time of admission and was admitted for further workup.  Hospital Course:  Patient presented with new-onset seizures, has a history of left hemorrhagic infarct. Patient had some speech deficits likely from post seizure phenomena which has improved at the time of discharge.  Seizures: No repeat seizures since admission. Patient was placed on IV Dilantin loading dose 1000mg  followed by 100mg  TID. MRI of the brain was obtained which showed no acute infarct, he does have encephalomalacia left periatrial/temporal region with hemosiderin breakdown products and subsequent dilation left temporal horn/atrium of the lateral ventricle.  EEG was  done and showed:  abnormal EEG secondary to left hemispheric slowing. This is consistent with the patient's history of a left hemispheric hemorrhagic stroke. No epileptiform activity was noted.  Neurology recommended Dilantin 100mg TID at discharge and follow- up with Neurology. Patient was recommended to follow up with [facility] neurology in 2 weeks.  Stroke, hemorrhagic: Patient had recovered from his prior left hemorrhagic infarct in 2007 with residual cognitive impairment.  HLD (hyperlipidemia): - On Lipitor  HTN (hypertension): - Continue HCTZ, lisinopril  ETOH  abuse : Patient had no withdrawal state during the hospitalization he was observed on CIWA protocol, thiamine, folic acid.   The results of significant diagnostics from this hospitalization (including imaging, microbiology, ancillary and laboratory) are listed below for reference.   LAB RESULTS:  Basic Metabolic Panel:  Recent Labs  Lab  04/26/12 1421  04/27/12 0450   NA  143  139   K  3.4*  3.5   CL  98  102   CO2  13*  27   GLUCOSE  194*  116*   BUN  19  19   CREATININE  1.14  1.16   CALCIUM  10.1  9.0   Liver Function Tests:  Recent Labs  Lab  04/26/12 1421  04/27/12 0450   AST  28  26   ALT  19  14   ALKPHOS  68  57   BILITOT  0.4  0.5   PROT  8.1  6.7   ALBUMIN  4.5  3.6   CBC:  Recent Labs  Lab  04/26/12 1421  04/27/12 0450   WBC  15.1*  10.2   NEUTROABS  8.3*  --   HGB  14.9  13.0   HCT  43.8  37.5*   MCV  92.6  86.8   PLT  292  176    Significant Diagnostic Studies:  Ct Head [name] Cruz  04/26/2012 *RADIOLOGY REPORT* Clinical Data: Code stroke. Seizure. Right arm weakness and disorientation. CT HEAD WITHOUT Cruz Technique: Contiguous axial images were obtained from the base of the skull through the vertex without Cruz. Comparison: Head CT scan 06/27/2011. Findings: Ex vacuo dilatation of the left lateral ventricle, greatest in the posterior horn, with a small calcification in the periphery the posterior horn is again seen. No evidence of acute abnormality including infarct, hemorrhage, mass lesion, mass effect, midline shift or abnormal extra-axial fluid collection. Mild periventricular white matter hypointensity is unchanged. The calvarium is intact. IMPRESSION: No acute finding. Stable compared to prior exam. Original Report Authenticated By: Holley Dexter, M.D.  [name] Cruz  04/27/2012 *RADIOLOGY REPORT* Clinical Data: Hemorrhagic stroke 2007 and with cognitive impairment. Hypertensive hyperlipidemic patient. History of alcohol consumption.  Presents with seizure. MRI HEAD WITHOUT Cruz Technique: Multiplanar, multiecho pulse sequences of the brain and surrounding structures were obtained according to standard protocol without intravenous Cruz. Comparison: 04/26/2012 CT. No comparison [name]. Findings: No acute infarct. Encephalomalacia left periatrial/temporal region with hemosiderin breakdown products and subsequent dilation left temporal horn/atrium of the lateral ventricle. Findings consistent with provided history of prior hemorrhagic stroke. Scattered tiny region of blood breakdown products most likely related to the prior episodes hemorrhagic ischemia. No other findings of intracranial hemorrhage. Moderate small vessel disease type changes. No intracranial mass lesion detected on this unenhanced exam. Major intracranial vascular structures are patent. Polypoid opacification inferior aspect left maxillary sinus. IMPRESSION: No acute infarct. Please see above. Original Report Authenticated By: Lacy Duverney, M.D.    Past Medical History  Diagnosis Date  . Stroke, hemorrhagic 2007    thought 2/2 HTN (240sbp); residual cognitive impairment, vision problem, no driving  . Cognitive impairment 2007    after stroke, saw rehab but told to stop because was too upsetting to him  . History of chicken pox   . HLD (hyperlipidemia)   . HTN (hypertension)   . Obesity     Review of Systems Per HPI    Objective:   Physical Exam  Nursing note and vitals reviewed. Constitutional: He appears well-developed and well-nourished. No distress.  HENT:  Head: Normocephalic and atraumatic.  Mouth/Throat: Oropharynx is clear and moist. No oropharyngeal exudate.  Eyes: Conjunctivae and EOM are normal. Pupils are equal, round, and reactive to light. No scleral icterus.  Neck: Normal range of motion. Neck supple. Carotid bruit is not present.  Cardiovascular: Normal rate, regular rhythm, normal heart sounds and intact distal pulses.   No murmur  heard. Pulmonary/Chest: Effort normal and breath sounds normal. No respiratory distress. He has no wheezes. He has no rales.  Musculoskeletal: He exhibits no edema.  Skin: Skin is warm and dry. No rash noted.  Psychiatric: He has a normal mood and affect.       Assessment & Plan:

## 2012-05-05 NOTE — Assessment & Plan Note (Addendum)
Had not been compliant with lipitor - encouraged to restart, refilled. rtc 6 mo for recheck FLP and office visit.

## 2012-05-05 NOTE — Assessment & Plan Note (Signed)
Pt quit EtOH this month after seizures.  Discussed alcohol will increase risk of seizures.

## 2012-08-27 ENCOUNTER — Other Ambulatory Visit: Payer: Self-pay | Admitting: Family Medicine

## 2012-10-28 ENCOUNTER — Ambulatory Visit (INDEPENDENT_AMBULATORY_CARE_PROVIDER_SITE_OTHER): Payer: Medicare Other | Admitting: Family Medicine

## 2012-10-28 ENCOUNTER — Encounter: Payer: Self-pay | Admitting: Family Medicine

## 2012-10-28 VITALS — BP 124/72 | HR 64 | Temp 98.1°F | Wt 183.2 lb

## 2012-10-28 DIAGNOSIS — G9389 Other specified disorders of brain: Secondary | ICD-10-CM

## 2012-10-28 DIAGNOSIS — E785 Hyperlipidemia, unspecified: Secondary | ICD-10-CM

## 2012-10-28 DIAGNOSIS — I619 Nontraumatic intracerebral hemorrhage, unspecified: Secondary | ICD-10-CM

## 2012-10-28 DIAGNOSIS — Z23 Encounter for immunization: Secondary | ICD-10-CM

## 2012-10-28 DIAGNOSIS — I10 Essential (primary) hypertension: Secondary | ICD-10-CM

## 2012-10-28 DIAGNOSIS — R569 Unspecified convulsions: Secondary | ICD-10-CM

## 2012-10-28 DIAGNOSIS — F1011 Alcohol abuse, in remission: Secondary | ICD-10-CM

## 2012-10-28 DIAGNOSIS — E669 Obesity, unspecified: Secondary | ICD-10-CM

## 2012-10-28 LAB — CBC WITH DIFFERENTIAL/PLATELET
Basophils Relative: 0.4 % (ref 0.0–3.0)
Eosinophils Absolute: 0.2 10*3/uL (ref 0.0–0.7)
Eosinophils Relative: 1.7 % (ref 0.0–5.0)
Hemoglobin: 12.4 g/dL — ABNORMAL LOW (ref 13.0–17.0)
Lymphocytes Relative: 19.2 % (ref 12.0–46.0)
MCHC: 34.2 g/dL (ref 30.0–36.0)
Neutro Abs: 7.3 10*3/uL (ref 1.4–7.7)
RBC: 3.93 Mil/uL — ABNORMAL LOW (ref 4.22–5.81)

## 2012-10-28 LAB — COMPREHENSIVE METABOLIC PANEL
ALT: 22 U/L (ref 0–53)
Alkaline Phosphatase: 79 U/L (ref 39–117)
Glucose, Bld: 106 mg/dL — ABNORMAL HIGH (ref 70–99)
Sodium: 135 mEq/L (ref 135–145)
Total Bilirubin: 0.3 mg/dL (ref 0.3–1.2)
Total Protein: 7.4 g/dL (ref 6.0–8.3)

## 2012-10-28 LAB — LIPID PANEL
HDL: 57.4 mg/dL (ref >39.00)
Total CHOL/HDL Ratio: 4
Triglycerides: 217 mg/dL — ABNORMAL HIGH (ref 0.0–149.0)

## 2012-10-28 NOTE — Addendum Note (Signed)
Addended by: Josph Macho A on: 10/28/2012 10:05 AM   Modules accepted: Orders

## 2012-10-28 NOTE — Assessment & Plan Note (Signed)
Restarted alcohol - discussed avoidance or moderation.

## 2012-10-28 NOTE — Assessment & Plan Note (Signed)
Compliant with lipitor 40mg .  Chronic. Check FLP today.

## 2012-10-28 NOTE — Assessment & Plan Note (Signed)
Chronic, stable. Continue med. 

## 2012-10-28 NOTE — Assessment & Plan Note (Signed)
2/2 HTN.  Stable.

## 2012-10-28 NOTE — Assessment & Plan Note (Signed)
With residual cognitive impairment.

## 2012-10-28 NOTE — Progress Notes (Signed)
  Subjective:    Patient ID: [name] Cruz, male    DOB: Apr 07, 1955, 57 y.o.   MRN: [id number]  HPI CC: 6 mo f/u  seizures - admitted 04/2012 with seizure likely related to EtOH use.  Never saw neurologist in f/u.  No new seizures. Compliant with phenytoin. H/o hemorrhagic stroke 2007 - from uncontrolled hypertension.  Residual congitive impairment from encephalomalacia. HTN - No HA, vision changes, CP/tightness, SOB, leg swelling.  Compliant with lisinopril hctz HLD - no myalgias with lipitor every morning.  This morning drank black coffee only.  EtOH - restarted drinking beer - 2-3 beer when he does drink.  Not daily.   Past Medical History  Diagnosis Date  . Stroke, hemorrhagic 2007    thought 2/2 HTN (240sbp); residual cognitive impairment, vision problem, no driving  . Cognitive impairment 2007    after stroke, saw rehab but told to stop because was too upsetting to him  . History of chicken pox   . HLD (hyperlipidemia)   . HTN (hypertension)   . Obesity      Review of Systems Per HPI    Objective:   Physical Exam  Nursing note and vitals reviewed. Constitutional: He appears well-developed and well-nourished. No distress.  HENT:  Mouth/Throat: Oropharynx is clear and moist. No oropharyngeal exudate.  Eyes: Conjunctivae and EOM are normal. Pupils are equal, round, and reactive to light. No scleral icterus.  Neck: Normal range of motion. Neck supple. Carotid bruit is not present.  Cardiovascular: Normal rate, regular rhythm, normal heart sounds and intact distal pulses.   No murmur heard. Pulmonary/Chest: Effort normal and breath sounds normal. No respiratory distress. He has no wheezes. He has no rales.  Musculoskeletal: He exhibits no edema.  Skin: Skin is warm and dry. No rash noted.       Assessment & Plan:

## 2012-10-28 NOTE — Patient Instructions (Signed)
Tetanus shot today (Td) Return in 6 months for physical, prior if needed. Continue meds as up to now. Blood work today.

## 2012-10-28 NOTE — Assessment & Plan Note (Signed)
New onset 04/2012.  ?EtOH related. No recurrent seizures.   Never f/u with neurology. Advised to continue phenytoin 100mg  tid. Check levels today.

## 2012-10-29 LAB — PHENYTOIN LEVEL, FREE: Phenytoin: 3.5 ug/mL — ABNORMAL LOW (ref 10.0–20.0)

## 2012-11-01 ENCOUNTER — Other Ambulatory Visit: Payer: Self-pay | Admitting: Family Medicine

## 2012-11-01 MED ORDER — ATORVASTATIN CALCIUM 80 MG PO TABS: 80.0000 mg | ORAL_TABLET | Freq: Every day | ORAL | Status: DC

## 2012-11-02 ENCOUNTER — Encounter: Payer: Self-pay | Admitting: *Deleted

## 2012-11-03 ENCOUNTER — Telehealth: Payer: Self-pay

## 2012-11-03 NOTE — Telephone Encounter (Signed)
pts mother received lab results and wants to know if it matters which sugar substitute pt uses. Advised as long as does not have sugar can try sweet and low, equal or whatever sugar substitute tastes best to pt.

## 2013-01-22 ENCOUNTER — Other Ambulatory Visit: Payer: Self-pay | Admitting: *Deleted

## 2013-01-22 MED ORDER — PHENYTOIN SODIUM EXTENDED 100 MG PO CAPS: ORAL_CAPSULE | ORAL | Status: DC

## 2013-01-22 NOTE — Telephone Encounter (Signed)
Faxed refill request. Please advise.  

## 2013-05-06 ENCOUNTER — Other Ambulatory Visit: Payer: Self-pay | Admitting: Family Medicine

## 2013-07-19 ENCOUNTER — Other Ambulatory Visit: Payer: Self-pay | Admitting: Family Medicine

## 2013-07-23 ENCOUNTER — Encounter: Payer: Self-pay | Admitting: Family Medicine

## 2013-07-23 ENCOUNTER — Ambulatory Visit (INDEPENDENT_AMBULATORY_CARE_PROVIDER_SITE_OTHER): Payer: Medicare Other | Admitting: Family Medicine

## 2013-07-23 VITALS — BP 144/90 | HR 64 | Temp 98.4°F | Wt 177.8 lb

## 2013-07-23 DIAGNOSIS — I619 Nontraumatic intracerebral hemorrhage, unspecified: Secondary | ICD-10-CM

## 2013-07-23 DIAGNOSIS — F1090 Alcohol use, unspecified, uncomplicated: Secondary | ICD-10-CM

## 2013-07-23 DIAGNOSIS — F09 Unspecified mental disorder due to known physiological condition: Secondary | ICD-10-CM

## 2013-07-23 DIAGNOSIS — R4189 Other symptoms and signs involving cognitive functions and awareness: Secondary | ICD-10-CM

## 2013-07-23 DIAGNOSIS — F1010 Alcohol abuse, uncomplicated: Secondary | ICD-10-CM

## 2013-07-23 DIAGNOSIS — I10 Essential (primary) hypertension: Secondary | ICD-10-CM

## 2013-07-23 DIAGNOSIS — R569 Unspecified convulsions: Secondary | ICD-10-CM

## 2013-07-23 DIAGNOSIS — Z7289 Other problems related to lifestyle: Secondary | ICD-10-CM

## 2013-07-23 MED ORDER — PHENYTOIN SODIUM EXTENDED 100 MG PO CAPS: ORAL_CAPSULE | ORAL | Status: DC

## 2013-07-23 MED ORDER — THIAMINE HCL 100 MG PO TABS: 100.0000 mg | ORAL_TABLET | Freq: Every day | ORAL | Status: DC

## 2013-07-23 MED ORDER — ATORVASTATIN CALCIUM 80 MG PO TABS: 80.0000 mg | ORAL_TABLET | Freq: Every day | ORAL | Status: DC

## 2013-07-23 MED ORDER — LISINOPRIL-HYDROCHLOROTHIAZIDE 20-12.5 MG PO TABS: ORAL_TABLET | ORAL | Status: DC

## 2013-07-23 MED ORDER — ADULT MULTIVITAMIN W/MINERALS CH: 1.0000 | ORAL_TABLET | Freq: Every day | ORAL | Status: DC

## 2013-07-23 NOTE — Assessment & Plan Note (Signed)
Continue BP control

## 2013-07-23 NOTE — Assessment & Plan Note (Signed)
Chronic, stable. Refilled lisinopril hctz.

## 2013-07-23 NOTE — Progress Notes (Signed)
Pre visit review using our clinic review tool, if applicable. No additional management support is needed unless otherwise documented below in the visit note. 

## 2013-07-23 NOTE — Assessment & Plan Note (Signed)
Stopped dilantin "ran out" although I sent in 1 year supply 01/2013. Refilled again today, recommended he restart med. Prior seizure presumed related to abrupt discontinuation of EtOH in habitual user.

## 2013-07-23 NOTE — Progress Notes (Signed)
   BP 144/90  Pulse 64  Temp(Src) 98.4 F (36.9 C) (Oral)  Wt 177 lb 12 oz (80.627 kg)   CC: f/u 8 mo  Subjective:    Patient ID: [name] Cruz, male    DOB: 26-May-1955, 58 y.o.   MRN: [id number]  HPI: [name] Cruz is a 58 y.o. male presenting on 07/23/2013 for Follow-up   Lives with older sister.  Seizures - admitted 04/2012 with seizure likely related to EtOH use. Never saw neurologist in f/u. Stopped phenytoin  Denies seizures  H/o hemorrhagic stroke 2007 - from uncontrolled hypertension. Residual congitive impairment from encephalomalacia. Lives home alone  HTN - compliant with meds. No HA, vision changes, CP/tightness, SOB, leg swelling.   EtOH - same. Drinking 3-4 beers nightly. States last drink was 3 days ago. Denies withdrawal sxs. Denies LOC with drinking, denies hangover effect.  Relevant past medical, surgical, family and social history reviewed and updated as indicated.  Allergies and medications reviewed and updated. Current Outpatient Prescriptions on File Prior to Visit  Medication Sig  . aspirin EC 81 MG tablet Take 81 mg by mouth daily.   No current facility-administered medications on file prior to visit.    Review of Systems Per HPI unless specifically indicated above    Objective:    BP 144/90  Pulse 64  Temp(Src) 98.4 F (36.9 C) (Oral)  Wt 177 lb 12 oz (80.627 kg)  Physical Exam  Nursing note and vitals reviewed. Constitutional: He appears well-developed and well-nourished. No distress.  HENT:  Mouth/Throat: Oropharynx is clear and moist. No oropharyngeal exudate.  Eyes: Conjunctivae and EOM are normal. Pupils are equal, round, and reactive to light.  Residual R vision field loss  Neck: Carotid bruit is not present. No thyromegaly present.  Cardiovascular: Normal rate, regular rhythm, normal heart sounds and intact distal pulses.   No murmur heard. Pulmonary/Chest: Effort normal and breath sounds normal. No respiratory distress. He has no  wheezes. He has no rales.  Musculoskeletal: He exhibits no edema.       Assessment & Plan:   Problem List Items Addressed This Visit   Stroke, hemorrhagic     Continue BP control.    Seizures     Stopped dilantin "ran out" although I sent in 1 year supply 01/2013. Refilled again today, recommended he restart med. Prior seizure presumed related to abrupt discontinuation of EtOH in habitual user.    Relevant Medications      phenytoin (DILANTIN) ER capsule   HTN (hypertension)     Chronic, stable. Refilled lisinopril hctz.    Relevant Medications      atorvastatin (LIPITOR) tablet      lisinopril-hydrochlorothiazide (PRINZIDE,ZESTORETIC) 20-12.5 MG per tablet   Habitual alcohol use - Primary     Reviewed dangers of regular alcohol use and dangers of stopping cold [place]. Encouraged slowed titration off alcohol.    Cognitive impairment     Chronic, stable. Lives with sister. Does not drive.        Follow up plan: Return in about 4 months (around 11/23/2013), or as needed, for annual exam, prior fasting for blood work.

## 2013-07-23 NOTE — Assessment & Plan Note (Signed)
Chronic, stable. Lives with sister. Does not drive.

## 2013-07-23 NOTE — Patient Instructions (Signed)
Restart phenytoin seizure medication (refilled to pharmacy) All your medicines were refilled to your pharmacy. Return in 4-6 months for fasting blood work and afterwards for [facility] Good to see you today, call us with questions. I recommend slowly decreasing alcohol use to prevent alcohol withdrawal and future seizures.

## 2013-07-23 NOTE — Assessment & Plan Note (Signed)
Reviewed dangers of regular alcohol use and dangers of stopping cold [place]. Encouraged slowed titration off alcohol.

## 2013-07-26 ENCOUNTER — Telehealth: Payer: Self-pay | Admitting: Family Medicine

## 2013-07-26 NOTE — Telephone Encounter (Signed)
Relevant patient education assigned to patient using Emmi. ° °

## 2013-11-26 ENCOUNTER — Telehealth (HOSPITAL_COMMUNITY): Payer: Self-pay | Admitting: *Deleted

## 2013-11-26 NOTE — Telephone Encounter (Signed)
plz schedule.

## 2013-11-26 NOTE — Telephone Encounter (Signed)
Spoke to pt about due AWV & need for colon ca screening--needs appt.

## 2013-11-30 NOTE — Telephone Encounter (Signed)
LVM for pt to call back.    RE: schedule AWV for 2015 

## 2014-08-30 ENCOUNTER — Other Ambulatory Visit: Payer: Self-pay | Admitting: Family Medicine

## 2014-08-30 NOTE — Telephone Encounter (Signed)
Electronic refill request. Last Filled:    90 tablet 3 RF on 07/23/2013  Last office visit:   07/23/13.  Please advise.

## 2014-10-17 ENCOUNTER — Other Ambulatory Visit: Payer: Self-pay | Admitting: Family Medicine

## 2014-10-17 ENCOUNTER — Ambulatory Visit (INDEPENDENT_AMBULATORY_CARE_PROVIDER_SITE_OTHER): Payer: Medicare Other | Admitting: Family Medicine

## 2014-10-17 ENCOUNTER — Encounter: Payer: Self-pay | Admitting: Family Medicine

## 2014-10-17 VITALS — BP 146/84 | HR 64 | Temp 98.1°F | Ht 63.0 in | Wt 165.8 lb

## 2014-10-17 DIAGNOSIS — F1010 Alcohol abuse, uncomplicated: Secondary | ICD-10-CM

## 2014-10-17 DIAGNOSIS — Z23 Encounter for immunization: Secondary | ICD-10-CM

## 2014-10-17 DIAGNOSIS — E785 Hyperlipidemia, unspecified: Secondary | ICD-10-CM

## 2014-10-17 DIAGNOSIS — R4189 Other symptoms and signs involving cognitive functions and awareness: Secondary | ICD-10-CM

## 2014-10-17 DIAGNOSIS — Z7289 Other problems related to lifestyle: Secondary | ICD-10-CM

## 2014-10-17 DIAGNOSIS — Z0000 Encounter for general adult medical examination without abnormal findings: Secondary | ICD-10-CM

## 2014-10-17 DIAGNOSIS — R569 Unspecified convulsions: Secondary | ICD-10-CM

## 2014-10-17 DIAGNOSIS — Z1159 Encounter for screening for other viral diseases: Secondary | ICD-10-CM | POA: Diagnosis not present

## 2014-10-17 DIAGNOSIS — I10 Essential (primary) hypertension: Secondary | ICD-10-CM

## 2014-10-17 DIAGNOSIS — F1090 Alcohol use, unspecified, uncomplicated: Secondary | ICD-10-CM

## 2014-10-17 DIAGNOSIS — Z125 Encounter for screening for malignant neoplasm of prostate: Secondary | ICD-10-CM

## 2014-10-17 DIAGNOSIS — Z7189 Other specified counseling: Secondary | ICD-10-CM | POA: Insufficient documentation

## 2014-10-17 DIAGNOSIS — Z1211 Encounter for screening for malignant neoplasm of colon: Secondary | ICD-10-CM

## 2014-10-17 DIAGNOSIS — I619 Nontraumatic intracerebral hemorrhage, unspecified: Secondary | ICD-10-CM

## 2014-10-17 LAB — LIPID PANEL
CHOL/HDL RATIO: 5
Cholesterol: 276 mg/dL — ABNORMAL HIGH (ref 0–200)
HDL: 53.2 mg/dL (ref >39.00)
LDL CALC: 198 mg/dL — AB (ref 0–99)
NONHDL: 222.62
Triglycerides: 123 mg/dL (ref 0.0–149.0)
VLDL: 24.6 mg/dL (ref 0.0–40.0)

## 2014-10-17 LAB — CBC WITH DIFFERENTIAL/PLATELET
BASOS PCT: 0.6 % (ref 0.0–3.0)
Basophils Absolute: 0 10*3/uL (ref 0.0–0.1)
EOS PCT: 0.9 % (ref 0.0–5.0)
Eosinophils Absolute: 0.1 10*3/uL (ref 0.0–0.7)
HCT: 42.8 % (ref 39.0–52.0)
HEMOGLOBIN: 14.3 g/dL (ref 13.0–17.0)
LYMPHS ABS: 1.9 10*3/uL (ref 0.7–4.0)
Lymphocytes Relative: 30 % (ref 12.0–46.0)
MCHC: 33.4 g/dL (ref 30.0–36.0)
MCV: 93.7 fl (ref 78.0–100.0)
MONO ABS: 0.6 10*3/uL (ref 0.1–1.0)
Monocytes Relative: 10.1 % (ref 3.0–12.0)
Neutro Abs: 3.6 10*3/uL (ref 1.4–7.7)
Neutrophils Relative %: 58.4 % (ref 43.0–77.0)
Platelets: 278 10*3/uL (ref 150.0–400.0)
RBC: 4.57 Mil/uL (ref 4.22–5.81)
RDW: 13.5 % (ref 11.5–15.5)
WBC: 6.2 10*3/uL (ref 4.0–10.5)

## 2014-10-17 LAB — PSA, MEDICARE: PSA: 0.5 ng/ml (ref 0.10–4.00)

## 2014-10-17 LAB — COMPREHENSIVE METABOLIC PANEL
ALT: 13 U/L (ref 0–53)
AST: 17 U/L (ref 0–37)
Albumin: 4.7 g/dL (ref 3.5–5.2)
Alkaline Phosphatase: 58 U/L (ref 39–117)
BUN: 14 mg/dL (ref 6–23)
CHLORIDE: 100 meq/L (ref 96–112)
CO2: 28 meq/L (ref 19–32)
CREATININE: 0.73 mg/dL (ref 0.40–1.50)
Calcium: 10.1 mg/dL (ref 8.4–10.5)
GFR: 116.71 mL/min (ref >60.00)
Glucose, Bld: 97 mg/dL (ref 70–99)
Potassium: 5.8 mEq/L — ABNORMAL HIGH (ref 3.5–5.1)
SODIUM: 138 meq/L (ref 135–145)
Total Bilirubin: 0.7 mg/dL (ref 0.2–1.2)
Total Protein: 7.4 g/dL (ref 6.0–8.3)

## 2014-10-17 LAB — TSH: TSH: 0.75 u[IU]/mL (ref 0.35–4.50)

## 2014-10-17 MED ORDER — LISINOPRIL 20 MG PO TABS: 20.0000 mg | ORAL_TABLET | Freq: Every day | ORAL | Status: DC

## 2014-10-17 MED ORDER — ATORVASTATIN CALCIUM 80 MG PO TABS: ORAL_TABLET | ORAL | Status: DC

## 2014-10-17 MED ORDER — THIAMINE HCL 100 MG PO TABS: 100.0000 mg | ORAL_TABLET | Freq: Every day | ORAL | Status: DC

## 2014-10-17 MED ORDER — PHENYTOIN SODIUM EXTENDED 100 MG PO CAPS: ORAL_CAPSULE | ORAL | Status: DC

## 2014-10-17 NOTE — Assessment & Plan Note (Signed)
Continue to encourage decreased use.  

## 2014-10-17 NOTE — Patient Instructions (Addendum)
Pass by lab for a stool kit.  labwork today.  Flu shot today.  Advanced directive packet provided today. I've refilled all your medicines today. Keep an eye on your blood pressure - goal is <140/90, let me know if consistently running high.  Return as needed or in 1 year for next wellness visit.  Health Maintenance A healthy lifestyle and preventative care can promote health and wellness.  Maintain regular health, dental, and eye exams.  Eat a healthy diet. Foods like vegetables, fruits, whole grains, low-fat dairy products, and lean protein foods contain the nutrients you need and are low in calories. Decrease your intake of foods high in solid fats, added sugars, and salt. Get information about a proper diet from your health care provider, if necessary.  Regular physical exercise is one of the most important things you can do for your health. Most adults should get at least 150 minutes of moderate-intensity exercise (any activity that increases your heart rate and causes you to sweat) each week. In addition, most adults need muscle-strengthening exercises on 2 or more days a week.   Maintain a healthy weight. The body mass index (BMI) is a screening tool to identify possible weight problems. It provides an estimate of body fat based on height and weight. Your health care provider can find your BMI and can help you achieve or maintain a healthy weight. For males 20 years and older:  A BMI below 18.5 is considered underweight.  A BMI of 18.5 to 24.9 is normal.  A BMI of 25 to 29.9 is considered overweight.  A BMI of 30 and above is considered obese.  Maintain normal blood lipids and cholesterol by exercising and minimizing your intake of saturated fat. Eat a balanced diet with plenty of fruits and vegetables. Blood tests for lipids and cholesterol should begin at age 14 and be repeated every 5 years. If your lipid or cholesterol levels are high, you are over age 37, or you are at high  risk for heart disease, you may need your cholesterol levels checked more frequently.Ongoing high lipid and cholesterol levels should be treated with medicines if diet and exercise are not working.  If you smoke, find out from your health care provider how to quit. If you do not use tobacco, do not start.  Lung cancer screening is recommended for adults aged 80-80 years who are at high risk for developing lung cancer because of a history of smoking. A yearly low-dose CT scan of the lungs is recommended for people who have at least a 30-pack-year history of smoking and are current smokers or have quit within the past 15 years. A pack year of smoking is smoking an average of 1 pack of cigarettes a day for 1 year (for example, a 30-pack-year history of smoking could mean smoking 1 pack a day for 30 years or 2 packs a day for 15 years). Yearly screening should continue until the smoker has stopped smoking for at least 15 years. Yearly screening should be stopped for people who develop a health problem that would prevent them from having lung cancer treatment.  If you choose to drink alcohol, do not have more than 2 drinks per day. One drink is considered to be 12 oz (360 mL) of beer, 5 oz (150 mL) of wine, or 1.5 oz (45 mL) of liquor.  Avoid the use of street drugs. Do not share needles with anyone. Ask for help if you need support or instructions about stopping  the use of drugs.  High blood pressure causes heart disease and increases the risk of stroke. Blood pressure should be checked at least every 1-2 years. Ongoing high blood pressure should be treated with medicines if weight loss and exercise are not effective.  If you are 67-37 years old, ask your health care provider if you should take aspirin to prevent heart disease.  Diabetes screening involves taking a blood sample to check your fasting blood sugar level. This should be done once every 3 years after age 55 if you are at a normal weight and  without risk factors for diabetes. Testing should be considered at a younger age or be carried out more frequently if you are overweight and have at least 1 risk factor for diabetes.  Colorectal cancer can be detected and often prevented. Most routine colorectal cancer screening begins at the age of 12 and continues through age 39. However, your health care provider may recommend screening at an earlier age if you have risk factors for colon cancer. On a yearly basis, your health care provider may provide home test kits to check for hidden blood in the stool. A small camera at the end of a tube may be used to directly examine the colon (sigmoidoscopy or colonoscopy) to detect the earliest forms of colorectal cancer. Talk to your health care provider about this at age 56 when routine screening begins. A direct exam of the colon should be repeated every 5-10 years through age 66, unless early forms of precancerous polyps or small growths are found.  People who are at an increased risk for hepatitis B should be screened for this virus. You are considered at high risk for hepatitis B if:  You were born in a country where hepatitis B occurs often. Talk with your health care provider about which countries are considered high risk.  Your parents were born in a high-risk country and you have not received a shot to protect against hepatitis B (hepatitis B vaccine).  You have HIV or AIDS.  You use needles to inject street drugs.  You live with, or have sex with, someone who has hepatitis B.  You are a man who has sex with other men (MSM).  You get hemodialysis treatment.  You take certain medicines for conditions like cancer, organ transplantation, and autoimmune conditions.  Hepatitis C blood testing is recommended for all people born from [birth year] through 1965 and any individual with known risk factors for hepatitis C.  Healthy men should no longer receive prostate-specific antigen (PSA) blood tests as  part of routine cancer screening. Talk to your health care provider about prostate cancer screening.  Testicular cancer screening is not recommended for adolescents or adult males who have no symptoms. Screening includes self-exam, a health care provider exam, and other screening tests. Consult with your health care provider about any symptoms you have or any concerns you have about testicular cancer.  Practice safe sex. Use condoms and avoid high-risk sexual practices to reduce the spread of sexually transmitted infections (STIs).  You should be screened for STIs, including gonorrhea and chlamydia if:  You are sexually active and are younger than 24 years.  You are older than 24 years, and your health care provider tells you that you are at risk for this type of infection.  Your sexual activity has changed since you were last screened, and you are at an increased risk for chlamydia or gonorrhea. Ask your health care provider if you are at  risk.  If you are at risk of being infected with HIV, it is recommended that you take a prescription medicine daily to prevent HIV infection. This is called pre-exposure prophylaxis (PrEP). You are considered at risk if:  You are a man who has sex with other men (MSM).  You are a heterosexual man who is sexually active with multiple partners.  You take drugs by injection.  You are sexually active with a partner who has HIV.  Talk with your health care provider about whether you are at high risk of being infected with HIV. If you choose to begin PrEP, you should first be tested for HIV. You should then be tested every 3 months for as long as you are taking PrEP.  Use sunscreen. Apply sunscreen liberally and repeatedly throughout the day. You should seek shade when your shadow is shorter than you. Protect yourself by wearing long sleeves, pants, a wide-brimmed hat, and sunglasses year round whenever you are outdoors.  Tell your health care provider of new  moles or changes in moles, especially if there is a change in shape or color. Also, tell your health care provider if a mole is larger than the size of a pencil eraser.  A one-time screening for abdominal aortic aneurysm (AAA) and surgical repair of large AAAs by ultrasound is recommended for men aged 32-75 years who are current or former smokers.  Stay current with your vaccines (immunizations). Document Released: 06/29/2007 Document Revised: 01/05/2013 Document Reviewed: 05/28/2010 [facility] Patient Information 2015 [place], [place]. This information is not intended to replace advice given to you by your health care provider. Make sure you discuss any questions you have with your health care provider.

## 2014-10-17 NOTE — Progress Notes (Signed)
BP 146/84 mmHg  Pulse 64  Temp(Src) 98.1 F (36.7 C) (Oral)  Ht  (1.6 m)  Wt 165 lb 12 oz (75.184 kg)  BMI 29.37 kg/m2   CC: medicare wellness visit  Subjective:    Patient ID: [name] Cruz, male    DOB: 12-08-1955, 59 y.o.   MRN: [id number]  HPI: [name] Cruz is a 59 y.o. male presenting on 10/17/2014 for Annual Exam   Last seen here 07/2013. H/o hemorrhagic stroke 2007 - from uncontrolled hypertension. Residual congitive impairment from encephalomalacia. Lives in [place] with older handicapped sister.   Off all meds for last several months.   Alcohol - 4-6 beers a day.  No smoking.   Hearing screen - passed Vision screen - failed (20/50) Fall risk screen - passed Depression screen - passed  Preventative: Colon cancer screening - states had normal test done by insurance nurse - agrees to iFOB.  Prostate cancer screening - discussed. Would like screening.  Flu shot -today Tetanus shot - 2014 Pneumovax - 2014 Shingles shot - not yet due Advanced directive discussion - would want sister Derenda Mis to be HCPOA. Doesn't want CPR or intubation.  Seat belt use discussed Sunscreen use and skin screen discussed   Caffeine: 1 pot coffee Lives with oldest sister Occupation: disabled, was Corporate investment banker Does not drive. Activity: no regular exercise Diet: good water, drinks V8 juice  Relevant past medical, surgical, family and social history reviewed and updated as indicated. Interim medical history since our last visit reviewed. Allergies and medications reviewed and updated. Current Outpatient Prescriptions on File Prior to Visit  Medication Sig  . aspirin EC 81 MG tablet Take 81 mg by mouth daily.  . Multiple Vitamin (MULTIVITAMIN WITH MINERALS) TABS tablet Take 1 tablet by mouth daily. (Patient not taking: Reported on 10/17/2014)   No current facility-administered medications on file prior to visit.    Review of Systems  Constitutional: Negative for  fever, chills, activity change, appetite change, fatigue and unexpected weight change.  HENT: Negative for hearing loss.   Eyes: Negative for visual disturbance.  Respiratory: Negative for cough, chest tightness, shortness of breath and wheezing.   Cardiovascular: Negative for chest pain, palpitations and leg swelling.  Gastrointestinal: Negative for nausea, vomiting, abdominal pain, diarrhea, constipation, blood in stool and abdominal distention.  Genitourinary: Negative for hematuria and difficulty urinating.  Musculoskeletal: Negative for myalgias, arthralgias and neck pain.  Skin: Negative for rash.  Neurological: Negative for dizziness, seizures, syncope and headaches.  Hematological: Negative for adenopathy. Does not bruise/bleed easily.  Psychiatric/Behavioral: Negative for dysphoric mood. The patient is not nervous/anxious.    Per HPI unless specifically indicated above     Objective:    BP 146/84 mmHg  Pulse 64  Temp(Src) 98.1 F (36.7 C) (Oral)  Ht  (1.6 m)  Wt 165 lb 12 oz (75.184 kg)  BMI 29.37 kg/m2  Wt Readings from Last 3 Encounters:  10/17/14 165 lb 12 oz (75.184 kg)  07/23/13 177 lb 12 oz (80.627 kg)  10/28/12 183 lb 4 oz (83.122 kg)    Physical Exam  Constitutional: He is oriented to person, place, and time. He appears well-developed and well-nourished. No distress.  HENT:  Head: Normocephalic and atraumatic.  Right Ear: Hearing, tympanic membrane, external ear and ear canal normal.  Left Ear: Hearing, tympanic membrane, external ear and ear canal normal.  Nose: Nose normal.  Mouth/Throat: Uvula is midline, oropharynx is clear and moist and mucous membranes  are normal. No oropharyngeal exudate, posterior oropharyngeal edema or posterior oropharyngeal erythema.  Eyes: Conjunctivae and EOM are normal. Pupils are equal, round, and reactive to light. No scleral icterus.  Neck: Normal range of motion. Neck supple. Carotid bruit is not present. No thyromegaly  present.  Cardiovascular: Normal rate, regular rhythm, normal heart sounds and intact distal pulses.   No murmur heard. Pulses:      Radial pulses are 2+ on the right side, and 2+ on the left side.  Pulmonary/Chest: Effort normal and breath sounds normal. No respiratory distress. He has no wheezes. He has no rales.  Abdominal: Soft. Bowel sounds are normal. He exhibits no distension and no mass. There is no tenderness. There is no rebound and no guarding.  Genitourinary: Rectum normal and prostate normal. Rectal exam shows no external hemorrhoid, no internal hemorrhoid, no fissure, no mass, no tenderness and anal tone normal. Prostate is not enlarged (20gm) and not tender.  Musculoskeletal: Normal range of motion. He exhibits no edema.  Lymphadenopathy:    He has no cervical adenopathy.  Neurological: He is alert and oriented to person, place, and time.  CN grossly intact, station and gait intact Recall 1/3, 2/3 with cue Calculation - unable to do  Skin: Skin is warm and dry. No rash noted.  Psychiatric: He has a normal mood and affect. His behavior is normal. Judgment and thought content normal.  Nursing note and vitals reviewed.  Results for orders placed or performed in visit on 10/28/12  Lipid panel  Result Value Ref Range   Cholesterol 215 (H) 0 - 200 mg/dL   Triglycerides 213.0 (H) 0.0 - 149.0 mg/dL   HDL 86.57 >84.69 mg/dL   VLDL 62.9 (H) 0.0 - 52.8 mg/dL   Total CHOL/HDL Ratio 4   Comprehensive metabolic panel  Result Value Ref Range   Sodium 135 135 - 145 mEq/L   Potassium 4.2 3.5 - 5.1 mEq/L   Chloride 96 96 - 112 mEq/L   CO2 29 19 - 32 mEq/L   Glucose, Bld 106 (H) 70 - 99 mg/dL   BUN 10 6 - 23 mg/dL   Creatinine, Ser 0.6 0.4 - 1.5 mg/dL   Total Bilirubin 0.3 0.3 - 1.2 mg/dL   Alkaline Phosphatase 79 39 - 117 U/L   AST 20 0 - 37 U/L   ALT 22 0 - 53 U/L   Total Protein 7.4 6.0 - 8.3 g/dL   Albumin 4.2 3.5 - 5.2 g/dL   Calcium 9.7 8.4 - 41.3 mg/dL   GFR 244.01  >02.72 mL/min  Phenytoin level, total and free  Result Value Ref Range   Phenytoin 3.5 (L) 10.0 - 20.0 ug/mL   Phenytoin, Free None Detected 1.0 - 2.0 ug/mL  CBC with Differential  Result Value Ref Range   WBC 10.3 4.5 - 10.5 K/uL   RBC 3.93 (L) 4.22 - 5.81 Mil/uL   Hemoglobin 12.4 (L) 13.0 - 17.0 g/dL   HCT 53.6 (L) 64.4 - 03.4 %   MCV 92.5 78.0 - 100.0 fl   MCHC 34.2 30.0 - 36.0 g/dL   RDW 74.2 59.5 - 63.8 %   Platelets 261.0 150.0 - 400.0 K/uL   Neutrophils Relative % 71.1 43.0 - 77.0 %   Lymphocytes Relative 19.2 12.0 - 46.0 %   Monocytes Relative 7.6 3.0 - 12.0 %   Eosinophils Relative 1.7 0.0 - 5.0 %   Basophils Relative 0.4 0.0 - 3.0 %   Neutro Abs 7.3 1.4 - 7.7  K/uL   Lymphs Abs 2.0 0.7 - 4.0 K/uL   Monocytes Absolute 0.8 0.1 - 1.0 K/uL   Eosinophils Absolute 0.2 0.0 - 0.7 K/uL   Basophils Absolute 0.0 0.0 - 0.1 K/uL  LDL cholesterol, direct  Result Value Ref Range   Direct LDL 131.9 mg/dL      Assessment & Plan:   Problem List Items Addressed This Visit    Stroke, hemorrhagic (HCC)    Discussed importance of good BP control. Refilled med.      Seizures (HCC)    Refilled dilantin.      Relevant Medications   phenytoin (DILANTIN) 100 MG ER capsule   Medicare annual wellness visit, initial - Primary    I have personally reviewed the Medicare Annual Wellness questionnaire and have noted 1. The patient's medical and social history 2. Their use of alcohol, tobacco or illicit drugs 3. Their current medications and supplements 4. The patient's functional ability including ADL's, fall risks, home safety risks and hearing or visual impairment. Cognitive function has been assessed and addressed as indicated.  5. Diet and physical activity 6. Evidence for depression or mood disorders The patients weight, height, BMI have been recorded in the chart. I have made referrals, counseling and provided education to the patient based on review of the above and I have provided  the pt with a written personalized care plan for preventive services. Provider list updated.. See scanned questionairre as needed for further documentation. Reviewed preventative protocols and updated unless pt declined.       HTN (hypertension)    Chronic, stable. Refill lisinopril  daily. Stop hctz component as bp today only mildly elevated.       Relevant Medications   atorvastatin (LIPITOR) 80 MG tablet   lisinopril (PRINIVIL,ZESTRIL) 20 MG tablet   Other Relevant Orders   Comprehensive metabolic panel   TSH   HLD (hyperlipidemia)    Recommended restart lipitor  daily.      Relevant Medications   atorvastatin (LIPITOR) 80 MG tablet   lisinopril (PRINIVIL,ZESTRIL) 20 MG tablet   Other Relevant Orders   Lipid panel   Comprehensive metabolic panel   Health maintenance examination    Preventative protocols reviewed and updated unless pt declined. Discussed healthy diet and lifestyle.       Habitual alcohol use    Continue to encourage decreased use.      Relevant Orders   Comprehensive metabolic panel   CBC with Differential/Platelet   Cognitive impairment    Chronic, stable. Lives with sister. Does not drive. No increased level of care needs identified today.      Advanced care planning/counseling discussion    Advanced directive discussion - would want sister Derenda Mis to be HCPOA. Doesn't want CPR or intubation.        Other Visit Diagnoses    Need for influenza vaccination        Relevant Orders    Flu Vaccine QUAD 36+ mos PF IM (Fluarix & Fluzone Quad PF) (Completed)    Special screening for malignant neoplasm of prostate        Relevant Orders    PSA, Medicare    Special screening for malignant neoplasms, colon        Relevant Orders    Fecal occult blood, imunochemical    Need for hepatitis C screening test        Relevant Orders    Hepatitis C antibody, reflex        Follow up  plan: Return in about 1 year (around 10/17/2015), or as  needed, for medicare wellness visit.

## 2014-10-17 NOTE — Assessment & Plan Note (Signed)
Preventative protocols reviewed and updated unless pt declined. Discussed healthy diet and lifestyle.  

## 2014-10-17 NOTE — Progress Notes (Signed)
Pre visit review using our clinic review tool, if applicable. No additional management support is needed unless otherwise documented below in the visit note. 

## 2014-10-17 NOTE — Assessment & Plan Note (Signed)
Refilled dilantin.

## 2014-10-17 NOTE — Assessment & Plan Note (Signed)
Chronic, stable. Refill lisinopril  daily. Stop hctz component as bp today only mildly elevated.

## 2014-10-17 NOTE — Addendum Note (Signed)
Addended by: Alvina Chou on: 10/17/2014 03:03 PM   Modules accepted: Orders

## 2014-10-17 NOTE — Assessment & Plan Note (Signed)
Advanced directive discussion - would want sister Derenda Mis to be HCPOA. Doesn't want CPR or intubation.

## 2014-10-17 NOTE — Assessment & Plan Note (Signed)
Discussed importance of good BP control. Refilled med.

## 2014-10-17 NOTE — Addendum Note (Signed)
Addended by: [name] on: 10/17/2014 05:56 PM   Modules accepted: Kipp Brood

## 2014-10-17 NOTE — Assessment & Plan Note (Signed)
Recommended restart lipitor  daily.

## 2014-10-17 NOTE — Assessment & Plan Note (Signed)

## 2014-10-17 NOTE — Assessment & Plan Note (Signed)
Chronic, stable. Lives with sister. Does not drive. No increased level of care needs identified today.

## 2014-10-18 ENCOUNTER — Other Ambulatory Visit: Payer: Self-pay | Admitting: Family Medicine

## 2014-10-18 DIAGNOSIS — E875 Hyperkalemia: Secondary | ICD-10-CM

## 2014-10-18 LAB — HEPATITIS C ANTIBODY: HCV AB: NEGATIVE

## 2014-10-18 MED ORDER — SODIUM POLYSTYRENE SULFONATE PO POWD: Freq: Once | ORAL | Status: DC

## 2014-10-19 ENCOUNTER — Telehealth: Payer: Self-pay

## 2014-10-19 NOTE — Telephone Encounter (Signed)
Noted. Dose is 1 time 15gm dose.

## 2014-10-19 NOTE — Telephone Encounter (Signed)
Crystal with [facility] pharmacy request clarification of instructions for kayexalate; Crystal has a 1 lb jar of med; after looking at jar crystal will put instructions pt to take 4 level tsps which would equal 15 gm. Per lab result note pt to take kayexalate x 1.  To Dr [name] as Lorain Childes.

## 2014-10-21 ENCOUNTER — Telehealth: Payer: Self-pay | Admitting: *Deleted

## 2014-10-21 ENCOUNTER — Other Ambulatory Visit (INDEPENDENT_AMBULATORY_CARE_PROVIDER_SITE_OTHER): Payer: Medicare Other

## 2014-10-21 DIAGNOSIS — E875 Hyperkalemia: Secondary | ICD-10-CM

## 2014-10-21 LAB — POTASSIUM: POTASSIUM: 4.5 meq/L (ref 3.5–5.1)

## 2014-10-21 NOTE — Telephone Encounter (Signed)
Pt came in for labs today and brought a bp log. I placed it in your inbox.

## 2014-10-22 MED ORDER — LISINOPRIL 10 MG PO TABS: 10.0000 mg | ORAL_TABLET | Freq: Every day | ORAL | Status: DC

## 2014-10-22 NOTE — Telephone Encounter (Signed)
Reviewed BP log from home - bp 170-220s on Wednesday then later in the week bp 90-130/60-70s. bp staying low with addition of  lisinopril - recommend cut pill in half and take only  lisinopril daily. New dose sent to pharmacy.

## 2014-10-24 ENCOUNTER — Other Ambulatory Visit: Payer: Medicare Other

## 2014-10-24 DIAGNOSIS — Z1211 Encounter for screening for malignant neoplasm of colon: Secondary | ICD-10-CM

## 2014-10-24 NOTE — Telephone Encounter (Signed)
Message left for patient's mother to return my call.

## 2014-10-24 NOTE — Telephone Encounter (Signed)
Patients mother notified and verbalized understanding 

## 2014-11-03 ENCOUNTER — Other Ambulatory Visit: Payer: Self-pay | Admitting: Family Medicine

## 2014-11-03 ENCOUNTER — Encounter: Payer: Self-pay | Admitting: *Deleted

## 2014-11-03 ENCOUNTER — Other Ambulatory Visit (INDEPENDENT_AMBULATORY_CARE_PROVIDER_SITE_OTHER): Payer: Medicare Other

## 2014-11-03 DIAGNOSIS — Z1211 Encounter for screening for malignant neoplasm of colon: Secondary | ICD-10-CM | POA: Diagnosis not present

## 2014-11-03 LAB — FECAL OCCULT BLOOD, GUAIAC: Fecal Occult Blood: NEGATIVE

## 2014-11-03 LAB — FECAL OCCULT BLOOD, IMMUNOCHEMICAL: FECAL OCCULT BLD: NEGATIVE

## 2015-10-20 ENCOUNTER — Ambulatory Visit (INDEPENDENT_AMBULATORY_CARE_PROVIDER_SITE_OTHER): Payer: Medicare Other | Admitting: Family Medicine

## 2015-10-20 ENCOUNTER — Encounter: Payer: Self-pay | Admitting: Family Medicine

## 2015-10-20 VITALS — BP 148/98 | HR 88 | Temp 98.3°F | Ht 63.0 in | Wt 169.5 lb

## 2015-10-20 DIAGNOSIS — F1090 Alcohol use, unspecified, uncomplicated: Secondary | ICD-10-CM

## 2015-10-20 DIAGNOSIS — G9389 Other specified disorders of brain: Secondary | ICD-10-CM

## 2015-10-20 DIAGNOSIS — Z7189 Other specified counseling: Secondary | ICD-10-CM

## 2015-10-20 DIAGNOSIS — R569 Unspecified convulsions: Secondary | ICD-10-CM

## 2015-10-20 DIAGNOSIS — E785 Hyperlipidemia, unspecified: Secondary | ICD-10-CM | POA: Diagnosis not present

## 2015-10-20 DIAGNOSIS — E669 Obesity, unspecified: Secondary | ICD-10-CM

## 2015-10-20 DIAGNOSIS — I10 Essential (primary) hypertension: Secondary | ICD-10-CM

## 2015-10-20 DIAGNOSIS — Z125 Encounter for screening for malignant neoplasm of prostate: Secondary | ICD-10-CM

## 2015-10-20 DIAGNOSIS — Z7289 Other problems related to lifestyle: Secondary | ICD-10-CM

## 2015-10-20 DIAGNOSIS — F1010 Alcohol abuse, uncomplicated: Secondary | ICD-10-CM | POA: Diagnosis not present

## 2015-10-20 DIAGNOSIS — R4189 Other symptoms and signs involving cognitive functions and awareness: Secondary | ICD-10-CM

## 2015-10-20 DIAGNOSIS — Z1211 Encounter for screening for malignant neoplasm of colon: Secondary | ICD-10-CM

## 2015-10-20 DIAGNOSIS — I619 Nontraumatic intracerebral hemorrhage, unspecified: Secondary | ICD-10-CM

## 2015-10-20 DIAGNOSIS — Z0000 Encounter for general adult medical examination without abnormal findings: Secondary | ICD-10-CM

## 2015-10-20 DIAGNOSIS — Z0001 Encounter for general adult medical examination with abnormal findings: Secondary | ICD-10-CM | POA: Diagnosis not present

## 2015-10-20 DIAGNOSIS — Z23 Encounter for immunization: Secondary | ICD-10-CM | POA: Diagnosis not present

## 2015-10-20 LAB — COMPREHENSIVE METABOLIC PANEL
ALBUMIN: 4.4 g/dL (ref 3.5–5.2)
ALT: 19 U/L (ref 0–53)
AST: 23 U/L (ref 0–37)
Alkaline Phosphatase: 59 U/L (ref 39–117)
BILIRUBIN TOTAL: 0.5 mg/dL (ref 0.2–1.2)
BUN: 10 mg/dL (ref 6–23)
CO2: 26 meq/L (ref 19–32)
CREATININE: 0.59 mg/dL (ref 0.40–1.50)
Calcium: 9.7 mg/dL (ref 8.4–10.5)
Chloride: 100 mEq/L (ref 96–112)
GFR: 148.7 mL/min (ref >60.00)
Glucose, Bld: 82 mg/dL (ref 70–99)
Potassium: 4.6 mEq/L (ref 3.5–5.1)
SODIUM: 137 meq/L (ref 135–145)
Total Protein: 7.1 g/dL (ref 6.0–8.3)

## 2015-10-20 LAB — LIPID PANEL
CHOL/HDL RATIO: 5
CHOLESTEROL: 374 mg/dL — AB (ref 0–200)
HDL: 75.9 mg/dL (ref >39.00)
LDL Cholesterol: 265 mg/dL — ABNORMAL HIGH (ref 0–99)
NonHDL: 298.26
TRIGLYCERIDES: 168 mg/dL — AB (ref 0.0–149.0)
VLDL: 33.6 mg/dL (ref 0.0–40.0)

## 2015-10-20 LAB — CBC WITH DIFFERENTIAL/PLATELET
BASOS ABS: 0 10*3/uL (ref 0.0–0.1)
Basophils Relative: 0.4 % (ref 0.0–3.0)
EOS ABS: 0.1 10*3/uL (ref 0.0–0.7)
Eosinophils Relative: 1.1 % (ref 0.0–5.0)
HEMATOCRIT: 37 % — AB (ref 39.0–52.0)
Hemoglobin: 12.6 g/dL — ABNORMAL LOW (ref 13.0–17.0)
LYMPHS PCT: 18.7 % (ref 12.0–46.0)
Lymphs Abs: 1.5 10*3/uL (ref 0.7–4.0)
MCHC: 34.2 g/dL (ref 30.0–36.0)
MCV: 95.2 fl (ref 78.0–100.0)
MONO ABS: 0.7 10*3/uL (ref 0.1–1.0)
Monocytes Relative: 8.5 % (ref 3.0–12.0)
NEUTROS ABS: 5.7 10*3/uL (ref 1.4–7.7)
NEUTROS PCT: 71.3 % (ref 43.0–77.0)
Platelets: 258 10*3/uL (ref 150.0–400.0)
RBC: 3.88 Mil/uL — ABNORMAL LOW (ref 4.22–5.81)
RDW: 13.4 % (ref 11.5–15.5)
WBC: 8 10*3/uL (ref 4.0–10.5)

## 2015-10-20 LAB — PSA, MEDICARE: PSA: 0.48 ng/mL (ref 0.10–4.00)

## 2015-10-20 NOTE — Assessment & Plan Note (Signed)
Advanced directive discussion - would want sister Derenda MisBrenda Gillmore to be HCPOA. Ok with CPR but doesn't want intubation or feeding tube.

## 2015-10-20 NOTE — Assessment & Plan Note (Signed)
Chronic, deteriorated. Unclear if he has been taking lisinopril. Advised restart - refilled today.

## 2015-10-20 NOTE — Assessment & Plan Note (Signed)
Post stroke. Lives with handicapped sister. Does not drive.

## 2015-10-20 NOTE — Patient Instructions (Addendum)
Flu shot today Pass by lab for labs and a stool kit. Make sure you are taking lisinopril for blood pressure.  Restart atorvastatin (lipitor) for cholesterol. Stop red yeast rice.  Wear sunscreen outdoors and wear a hat to protect you from the sun Try to cut down on drinking to 3-4 beers a day Advanced directive packet provided today.  Return in 6 months for follow up visit

## 2015-10-20 NOTE — Assessment & Plan Note (Signed)
Continue dilantin. No recent noted seizures.

## 2015-10-20 NOTE — Assessment & Plan Note (Signed)
Preventative protocols reviewed and updated unless pt declined. Discussed healthy diet and lifestyle.  

## 2015-10-20 NOTE — Assessment & Plan Note (Signed)
Discussed activity/yardwork

## 2015-10-20 NOTE — Progress Notes (Signed)
BP (!) 148/98   Pulse 88   Temp 98.3 F (36.8 C) (Oral)   Ht 5\' 3"  (1.6 m)   Wt 169 lb 8 oz (76.9 kg)   BMI 30.03 kg/m    CC: medicare wellness visit Subjective:    Patient ID: [name] Cruz, male    DOB: 1955-09-12, 60 y.o.   MRN: [id number]  HPI: [name] MileBilly J Zandi is a 60 y.o. male presenting on 10/20/2015 for Annual Exam   H/o hemorrhagic stroke 2007 - from uncontrolled hypertension. Residual congitive impairment from encephalomalacia. Lives in [place] with older handicapped sister.   Missing lipitor and lisinopril from bag of medications he brings in today. For unclear reason he has started RYR instead.   Hearing screen - passed Vision screen - failed on right ?CVA related Fall risk screen - passed Depression screen - passed  Preventative: Colon cancer screening - iFOB yearly Prostate cancer screening - reassuring last year - rpt next year Flu shot yearly  Tetanus shot - 2014 Pneumovax - 2014 Shingles shot - declines at this time Advanced directive discussion - would want sister Derenda MisBrenda Gillmore to be HCPOA. Ok with CPR but doesn't want intubation or feeding tube.  Seat belt use discussed Sunscreen use and skin screen discussed  Alcohol - 4-6 beers a day.  No smoking.   Caffeine: 1 pot coffee Lives with oldest sister Occupation: disabled, was Corporate investment bankerconstruction worker Does not drive. Activity: no regular exercise Diet: good water, drinks V8 juice  Relevant past medical, surgical, family and social history reviewed and updated as indicated. Interim medical history since our last visit reviewed. Allergies and medications reviewed and updated. Current Outpatient Prescriptions on File Prior to Visit  Medication Sig  . aspirin EC 81 MG tablet Take 81 mg by mouth daily.  . phenytoin (DILANTIN) 100 MG ER capsule TAKE ONE CAPSULE BY MOUTH 3 TIMES A DAY  . atorvastatin (LIPITOR) 80 MG tablet TAKE 1 TABLET (80 MG TOTAL) BY MOUTH DAILY. FOR CHOLESTEROL (Patient not taking:  Reported on 10/20/2015)  . lisinopril (PRINIVIL,ZESTRIL) 10 MG tablet Take 1 tablet (10 mg total) by mouth daily. (Patient not taking: Reported on 10/20/2015)  . thiamine 100 MG tablet Take 1 tablet (100 mg total) by mouth daily. (Patient not taking: Reported on 10/20/2015)   No current facility-administered medications on file prior to visit.     Review of Systems  Constitutional: Negative for activity change, appetite change, chills, fatigue, fever and unexpected weight change.  HENT: Negative for hearing loss.   Eyes: Negative for visual disturbance.  Respiratory: Negative for cough, chest tightness, shortness of breath and wheezing.   Cardiovascular: Negative for chest pain, palpitations and leg swelling.  Gastrointestinal: Negative for abdominal distention, abdominal pain, blood in stool, constipation, diarrhea, nausea and vomiting.  Genitourinary: Negative for difficulty urinating and hematuria.  Musculoskeletal: Negative for arthralgias, myalgias and neck pain.  Skin: Negative for rash.  Neurological: Negative for dizziness, seizures, syncope and headaches.  Hematological: Negative for adenopathy. Does not bruise/bleed easily.  Psychiatric/Behavioral: Negative for dysphoric mood. The patient is not nervous/anxious.    Per HPI unless specifically indicated in ROS section     Objective:    BP (!) 148/98   Pulse 88   Temp 98.3 F (36.8 C) (Oral)   Ht 5\' 3"  (1.6 m)   Wt 169 lb 8 oz (76.9 kg)   BMI 30.03 kg/m   Wt Readings from Last 3 Encounters:  10/20/15 169 lb 8 oz (76.9 kg)  10/17/14 165 lb 12 oz (75.2 kg)  07/23/13 177 lb 12 oz (80.6 kg)    Physical Exam  Constitutional: He is oriented to person, place, and time. He appears well-developed and well-nourished. No distress.  HENT:  Head: Normocephalic and atraumatic.  Right Ear: Hearing, tympanic membrane, external ear and ear canal normal.  Left Ear: Hearing, tympanic membrane, external ear and ear canal normal.  Nose:  Nose normal.  Mouth/Throat: Uvula is midline, oropharynx is clear and moist and mucous membranes are normal. No oropharyngeal exudate, posterior oropharyngeal edema or posterior oropharyngeal erythema.  Eyes: Conjunctivae and EOM are normal. Pupils are equal, round, and reactive to light. No scleral icterus.  Neck: Normal range of motion. Neck supple. No thyromegaly present.  Cardiovascular: Normal rate, regular rhythm, normal heart sounds and intact distal pulses.   No murmur heard. Pulses:      Radial pulses are 2+ on the right side, and 2+ on the left side.  Pulmonary/Chest: Effort normal and breath sounds normal. No respiratory distress. He has no wheezes. He has no rales.  Abdominal: Soft. Bowel sounds are normal. He exhibits no distension and no mass. There is no tenderness. There is no rebound and no guarding.  Musculoskeletal: Normal range of motion. He exhibits no edema.  Lymphadenopathy:    He has no cervical adenopathy.  Neurological: He is alert and oriented to person, place, and time.  CN grossly intact, station and gait intact Known cognitive impairment  Skin: Skin is warm and dry. No rash noted.  Psychiatric: He has a normal mood and affect. His behavior is normal. Judgment and thought content normal.  Nursing note and vitals reviewed.  Results for orders placed or performed in visit on 11/03/14  Fecal Occult Blood, Guaiac  Result Value Ref Range   Fecal Occult Blood Negative       Assessment & Plan:   Problem List Items Addressed This Visit    Advanced care planning/counseling discussion    Advanced directive discussion - would want sister Derenda Mis to be HCPOA. Ok with CPR but doesn't want intubation or feeding tube.       Cognitive impairment    Post stroke. Lives with handicapped sister. Does not drive.       Encephalomalacia   Habitual alcohol use    Continue to encourage decreased use. Suggested try to decrease to 3 beers a day.      Relevant  Orders   Comprehensive metabolic panel   CBC with Differential/Platelet   Health maintenance examination    Preventative protocols reviewed and updated unless pt declined. Discussed healthy diet and lifestyle.       HLD (hyperlipidemia)    Chronic. On RYR in place of lipitor. Doubt this will be sufficient. rec restart atorvastatin 80mg  daily.      Relevant Orders   Lipid panel   Comprehensive metabolic panel   HTN (hypertension)    Chronic, deteriorated. Unclear if he has been taking lisinopril. Advised restart - refilled today.       Relevant Orders   Lipid panel   Medicare annual wellness visit, subsequent - Primary    I have personally reviewed the Medicare Annual Wellness questionnaire and have noted 1. The patient's medical and social history 2. Their use of alcohol, tobacco or illicit drugs 3. Their current medications and supplements 4. The patient's functional ability including ADL's, fall risks, home safety risks and hearing or visual impairment. Cognitive function has been assessed and addressed as indicated.  5. Diet and physical activity 6. Evidence for depression or mood disorders The patients weight, height, BMI have been recorded in the chart. I have made referrals, counseling and provided education to the patient based on review of the above and I have provided the pt with a written personalized care plan for preventive services. Provider list updated.. See scanned questionairre as needed for further documentation. Reviewed preventative protocols and updated unless pt declined.       Obesity, Class I, BMI 30-34.9    Discussed activity/yardwork      Seizures (HCC)    Continue dilantin. No recent noted seizures.      Stroke, hemorrhagic [facility])    Discussed importance of statin and blood pressure control to prevent recurrent event. Continue aspirin.       Other Visit Diagnoses    Need for influenza vaccination       Relevant Orders   Flu Vaccine QUAD  36+ mos PF IM (Fluarix & Fluzone Quad PF) (Completed)   Special screening for malignant neoplasm of prostate       Relevant Orders   PSA, Medicare   Special screening for malignant neoplasms, colon       Relevant Orders   Fecal occult blood, imunochemical       Follow up plan: Return in about 6 months (around 04/19/2016), or as needed, for follow up visit.  [name], MD

## 2015-10-20 NOTE — Assessment & Plan Note (Addendum)
Chronic. On RYR in place of lipitor. Doubt this will be sufficient. rec restart atorvastatin 80mg  daily.

## 2015-10-20 NOTE — Progress Notes (Signed)
Pre visit review using our clinic review tool, if applicable. No additional management support is needed unless otherwise documented below in the visit note. 

## 2015-10-20 NOTE — Assessment & Plan Note (Signed)

## 2015-10-20 NOTE — Assessment & Plan Note (Signed)
Discussed importance of statin and blood pressure control to prevent recurrent event. Continue aspirin.

## 2015-10-20 NOTE — Assessment & Plan Note (Signed)
Continue to encourage decreased use. Suggested try to decrease to 3 beers a day.

## 2015-10-21 ENCOUNTER — Other Ambulatory Visit: Payer: Self-pay | Admitting: Family Medicine

## 2015-10-26 ENCOUNTER — Encounter: Payer: Self-pay | Admitting: *Deleted

## 2015-10-26 ENCOUNTER — Other Ambulatory Visit (INDEPENDENT_AMBULATORY_CARE_PROVIDER_SITE_OTHER): Payer: Medicare Other

## 2015-10-26 DIAGNOSIS — Z1211 Encounter for screening for malignant neoplasm of colon: Secondary | ICD-10-CM | POA: Diagnosis not present

## 2015-10-26 LAB — FECAL OCCULT BLOOD, GUAIAC: Fecal Occult Blood: NEGATIVE

## 2015-10-26 LAB — FECAL OCCULT BLOOD, IMMUNOCHEMICAL: FECAL OCCULT BLD: NEGATIVE

## 2015-10-30 ENCOUNTER — Other Ambulatory Visit: Payer: Self-pay | Admitting: Family Medicine

## 2015-12-11 ENCOUNTER — Encounter: Payer: Self-pay | Admitting: Family Medicine

## 2015-12-11 ENCOUNTER — Other Ambulatory Visit: Payer: Self-pay | Admitting: Family Medicine

## 2015-12-11 NOTE — Telephone Encounter (Signed)
Refill sent per [facility] refill protocol/SLS  

## 2016-01-26 ENCOUNTER — Telehealth: Payer: Self-pay

## 2016-01-26 NOTE — Telephone Encounter (Signed)
Christine with [facility] [address] left v/m; the rx for lisinopril 10 mg # 90 x 1 on 10/30/15 was erroneously filled by [facility] [address] for lisinopril 20 mg # 90 . Pt has taken the entire # 90 but Wynona CanesChristine has corrected this refill to lisinopril 10 mg # 90 taking one daily. Christine wanted Dr Sharen HonesGutierrez to be aware of error by [facility].

## 2016-04-19 ENCOUNTER — Ambulatory Visit (INDEPENDENT_AMBULATORY_CARE_PROVIDER_SITE_OTHER): Payer: Medicare Other | Admitting: Family Medicine

## 2016-04-19 ENCOUNTER — Encounter: Payer: Self-pay | Admitting: Family Medicine

## 2016-04-19 VITALS — BP 180/100 | HR 72 | Temp 98.0°F | Wt 176.8 lb

## 2016-04-19 DIAGNOSIS — I10 Essential (primary) hypertension: Secondary | ICD-10-CM

## 2016-04-19 DIAGNOSIS — R569 Unspecified convulsions: Secondary | ICD-10-CM | POA: Diagnosis not present

## 2016-04-19 DIAGNOSIS — R4189 Other symptoms and signs involving cognitive functions and awareness: Secondary | ICD-10-CM

## 2016-04-19 DIAGNOSIS — I6930 Unspecified sequelae of cerebral infarction: Secondary | ICD-10-CM

## 2016-04-19 DIAGNOSIS — E785 Hyperlipidemia, unspecified: Secondary | ICD-10-CM | POA: Diagnosis not present

## 2016-04-19 DIAGNOSIS — G9389 Other specified disorders of brain: Secondary | ICD-10-CM

## 2016-04-19 MED ORDER — ATORVASTATIN CALCIUM 80 MG PO TABS: ORAL_TABLET | ORAL | 3 refills | Status: DC

## 2016-04-19 MED ORDER — LISINOPRIL 20 MG PO TABS: 20.0000 mg | ORAL_TABLET | Freq: Every day | ORAL | 3 refills | Status: DC

## 2016-04-19 NOTE — Assessment & Plan Note (Signed)
Chronic, uncontrolled. Increase lisinopril to  daily. RTC 3 mo f/u visit.

## 2016-04-19 NOTE — Patient Instructions (Addendum)
Stop red yeast rice - not strong enough for cholesterol levels. In its place, start lipitor  daily. Let me know if any concerns or side effects to the medicine. Increase lisinopril to  daily. May double up on  dose (2 a day) until you run out, new prescription will wlil be for  dose.  New medicine list printed out today.  Return in 3 months for follow up visit.

## 2016-04-19 NOTE — Progress Notes (Signed)
BP (!) 180/100 Comment: No meds this AM  Pulse 72   Temp 98 F (36.7 C) (Oral)   Wt 176 lb 12 oz (80.2 kg)   BMI 31.31 kg/m    CC: 6 mo f/u visit Subjective:    Patient ID: [name] Cruz, male    DOB: [birth year], 61 y.o.   MRN: [id number]  HPI: [name] Cruz is a 61 y.o. male presenting on 04/19/2016 for Follow-up   History of hemorrhagic stroke from uncontrolled hypertension 2007 with residual cognitive impairment from encephalomalacia - lives with sister, mother also helps care for him.   Here alone - friend brought him.  Brings bag of meds - with lisinopril, aspirin, phenytoin and RYR. No lipitor or thiamine.   HTN - Compliant with current antihypertensive regimen of lisinopril  daily (although didn't take this morning). Does not check blood pressures at home:. Denies low blood pressure readings or symptoms of dizziness/syncope. Denies HA, vision changes, CP/tightness, SOB, leg swelling.    Relevant past medical, surgical, family and social history reviewed and updated as indicated. Interim medical history since our last visit reviewed. Allergies and medications reviewed and updated. Outpatient Medications Prior to Visit  Medication Sig Dispense Refill  . aspirin EC 81 MG tablet Take 81 mg by mouth daily.    . phenytoin (DILANTIN) 100 MG ER capsule TAKE ONE CAPSULE BY MOUTH 3 TIMES A DAY 270 capsule 1  . lisinopril (PRINIVIL,ZESTRIL) 10 MG tablet TAKE 1 TABLET (10 MG TOTAL) BY MOUTH DAILY. 90 tablet 1  . thiamine 100 MG tablet Take 1 tablet (100 mg total) by mouth daily. (Patient not taking: Reported on 10/20/2015) 90 tablet 3  . atorvastatin (LIPITOR) 80 MG tablet TAKE 1 TABLET (80 MG TOTAL) BY MOUTH DAILY. FOR CHOLESTEROL (Patient not taking: Reported on 10/20/2015) 90 tablet 3   No facility-administered medications prior to visit.      Per HPI unless specifically indicated in ROS section below Review of Systems     Objective:    BP (!) 180/100 Comment: No meds  this AM  Pulse 72   Temp 98 F (36.7 C) (Oral)   Wt 176 lb 12 oz (80.2 kg)   BMI 31.31 kg/m   Wt Readings from Last 3 Encounters:  04/19/16 176 lb 12 oz (80.2 kg)  10/20/15 169 lb 8 oz (76.9 kg)  10/17/14 165 lb 12 oz (75.2 kg)    BP on repeat 200/110  Physical Exam  Constitutional: He appears well-developed and well-nourished. No distress.  HENT:  Mouth/Throat: Oropharynx is clear and moist. No oropharyngeal exudate.  Cardiovascular: Normal rate, regular rhythm, normal heart sounds and intact distal pulses.   No murmur heard. Pulmonary/Chest: Effort normal and breath sounds normal. No respiratory distress. He has no wheezes. He has no rales.  Musculoskeletal: He exhibits no edema.  Skin: Skin is warm and dry. No rash noted.  Psychiatric: He has a normal mood and affect.  pleasant  Nursing note and vitals reviewed.  Results for orders placed or performed in visit on 10/26/15  Fecal Occult Blood, Guaiac  Result Value Ref Range   Fecal Occult Blood Negative       Assessment & Plan:   Problem List Items Addressed This Visit    Cognitive impairment   Encephalomalacia   History of stroke with current residual effects    Discussed importance of better blood pressure and cholesterol control to help decrease risk of recurrent stroke.  HLD (hyperlipidemia)    Uncontrolled on last check despite RYR daily. He has not yet started lipitor. Again advised of importance of better cholesterol control - [facility]       Relevant Medications   lisinopril (PRINIVIL,ZESTRIL) 20 MG tablet   atorvastatin (LIPITOR) 80 MG tablet   HTN (hypertension) - Primary    Chronic, uncontrolled. Increase lisinopril to  daily. RTC 3 mo f/u visit.       Relevant Medications   lisinopril (PRINIVIL,ZESTRIL) 20 MG tablet   atorvastatin (LIPITOR) 80 MG tablet   Seizures (HCC)    Continue phenytoin. Check labs next visit.           Follow up plan: No  Follow-up on file.  [name], MD

## 2016-04-19 NOTE — Assessment & Plan Note (Addendum)
Continue phenytoin. Check labs next visit.

## 2016-04-19 NOTE — Assessment & Plan Note (Signed)
Uncontrolled on last check despite RYR daily. He has not yet started lipitor. Again advised of importance of better cholesterol control - [facility]

## 2016-04-19 NOTE — Progress Notes (Signed)
Pre visit review using our clinic review tool, if applicable. No additional management support is needed unless otherwise documented below in the visit note. 

## 2016-04-19 NOTE — Assessment & Plan Note (Signed)
Discussed importance of better blood pressure and cholesterol control to help decrease risk of recurrent stroke.

## 2016-06-15 ENCOUNTER — Other Ambulatory Visit: Payer: Self-pay | Admitting: Family Medicine

## 2016-07-19 ENCOUNTER — Ambulatory Visit: Payer: Medicare Other | Admitting: Family Medicine

## 2016-07-22 ENCOUNTER — Encounter: Payer: Self-pay | Admitting: Family Medicine

## 2016-07-22 ENCOUNTER — Ambulatory Visit (INDEPENDENT_AMBULATORY_CARE_PROVIDER_SITE_OTHER): Payer: Medicare Other | Admitting: Family Medicine

## 2016-07-22 VITALS — BP 138/90 | HR 72 | Temp 98.1°F | Wt 171.0 lb

## 2016-07-22 DIAGNOSIS — E785 Hyperlipidemia, unspecified: Secondary | ICD-10-CM | POA: Diagnosis not present

## 2016-07-22 DIAGNOSIS — I6930 Unspecified sequelae of cerebral infarction: Secondary | ICD-10-CM | POA: Diagnosis not present

## 2016-07-22 DIAGNOSIS — I10 Essential (primary) hypertension: Secondary | ICD-10-CM

## 2016-07-22 DIAGNOSIS — R569 Unspecified convulsions: Secondary | ICD-10-CM

## 2016-07-22 LAB — BASIC METABOLIC PANEL
BUN: 10 mg/dL (ref 6–23)
CHLORIDE: 97 meq/L (ref 96–112)
CO2: 29 meq/L (ref 19–32)
Calcium: 9.9 mg/dL (ref 8.4–10.5)
Creatinine, Ser: 0.62 mg/dL (ref 0.40–1.50)
GFR: 140.08 mL/min (ref >60.00)
GLUCOSE: 109 mg/dL — AB (ref 70–99)
POTASSIUM: 5.7 meq/L — AB (ref 3.5–5.1)
SODIUM: 133 meq/L — AB (ref 135–145)

## 2016-07-22 LAB — VITAMIN D 25 HYDROXY (VIT D DEFICIENCY, FRACTURES): VITD: 34.54 ng/mL (ref 30.00–100.00)

## 2016-07-22 LAB — LDL CHOLESTEROL, DIRECT: Direct LDL: 122 mg/dL

## 2016-07-22 NOTE — Assessment & Plan Note (Signed)
bp control has improved. Continue higher lisinopril dose.

## 2016-07-22 NOTE — Assessment & Plan Note (Signed)
Chronic, improved. Continue higher lisinopril 20mg  dose.

## 2016-07-22 NOTE — Assessment & Plan Note (Signed)
Check phenytoin level 

## 2016-07-22 NOTE — Assessment & Plan Note (Signed)
Chronic. Compliant with current regimen of atorvastatin 80mg  daily. Check dLDL today.

## 2016-07-22 NOTE — Progress Notes (Signed)
BP 138/90 (BP Location: Left Arm, Patient Position: Sitting, Cuff Size: Normal)   Pulse 72   Temp 98.1 F (36.7 C) (Oral)   Wt 171 lb (77.6 kg)   BMI 30.29 kg/m    CC: 24mo f/u visit Subjective:    Patient ID: [name] Cruz, male    DOB: 1955/09/10, 61 y.o.   MRN: [id number]  HPI: [name] MileBilly J Furuya is a 61 y.o. male presenting on 07/22/2016 for Hypertension (3 month follow-up. Says he checks it at home and it is in the normal range. Unsure of numbers.)   Son drove him here today.   History of hemorrhagic stroke from uncontrolled hypertension 2007 with residual cognitive impairment from encephalomalacia - lives with sister, mother also helps care for him.   HTN - Compliant with current antihypertensive regimen of lisinopril 20mg  daily.  Does check blood pressures at home: endorses good control. No low blood pressure readings or symptoms of dizziness/syncope.  Denies HA, vision changes, CP/tightness, SOB, leg swelling.   Relevant past medical, surgical, family and social history reviewed and updated as indicated. Interim medical history since our last visit reviewed. Allergies and medications reviewed and updated. Outpatient Medications Prior to Visit  Medication Sig Dispense Refill  . aspirin EC 81 MG tablet Take 81 mg by mouth daily.    Marland Kitchen. atorvastatin (LIPITOR) 80 MG tablet TAKE 1 TABLET (80 MG TOTAL) BY MOUTH DAILY. FOR CHOLESTEROL 90 tablet 3  . lisinopril (PRINIVIL,ZESTRIL) 20 MG tablet Take 1 tablet (20 mg total) by mouth daily. 90 tablet 3  . phenytoin (DILANTIN) 100 MG ER capsule TAKE ONE CAPSULE BY MOUTH 3 TIMES A DAY 270 capsule 3  . thiamine 100 MG tablet Take 1 tablet (100 mg total) by mouth daily. 90 tablet 3   No facility-administered medications prior to visit.      Per HPI unless specifically indicated in ROS section below Review of Systems     Objective:    BP 138/90 (BP Location: Left Arm, Patient Position: Sitting, Cuff Size: Normal)   Pulse 72   Temp 98.1 F  (36.7 C) (Oral)   Wt 171 lb (77.6 kg)   BMI 30.29 kg/m   Wt Readings from Last 3 Encounters:  07/22/16 171 lb (77.6 kg)  04/19/16 176 lb 12 oz (80.2 kg)  10/20/15 169 lb 8 oz (76.9 kg)    Physical Exam  Constitutional: He appears well-developed and well-nourished. No distress.  HENT:  Mouth/Throat: Oropharynx is clear and moist. No oropharyngeal exudate.  Cardiovascular: Normal rate, regular rhythm, normal heart sounds and intact distal pulses.   No murmur heard. Pulmonary/Chest: Effort normal and breath sounds normal. No respiratory distress. He has no wheezes. He has no rales.  Musculoskeletal: He exhibits no edema.  Psychiatric: He has a normal mood and affect.  Nursing note and vitals reviewed.      Assessment & Plan:   Problem List Items Addressed This Visit    History of stroke with current residual effects    bp control has improved. Continue higher lisinopril dose.       HLD (hyperlipidemia)    Chronic. Compliant with current regimen of atorvastatin 80mg  daily. Check dLDL today.       Relevant Orders   LDL Cholesterol, Direct   HTN (hypertension) - Primary    Chronic, improved. Continue higher lisinopril 20mg  dose.       Relevant Orders   Basic metabolic panel   Seizures (HCC)    Check phenytoin level.  Relevant Orders   Phenytoin level, free   VITAMIN D 25 Hydroxy (Vit-D Deficiency, Fractures)       Follow up plan: Return in about 4 months (around 11/22/2016) for annual exam, prior fasting for blood work, medicare wellness visit.  [name], MD

## 2016-07-22 NOTE — Patient Instructions (Addendum)
Blood pressure is doing much better! Keep up the good work. Labs today.  You are doing well today.  Return in 3-4 months with Virl AxeLesia for medicare wellness and fasting labs, and then a week later for physical with me.

## 2016-07-26 LAB — PHENYTOIN LEVEL, FREE: Phenytoin, Free: NOT DETECTED ug/mL (ref 1.0–2.0)

## 2016-07-27 ENCOUNTER — Other Ambulatory Visit: Payer: Self-pay | Admitting: Family Medicine

## 2016-07-27 MED ORDER — LISINOPRIL-HYDROCHLOROTHIAZIDE 10-12.5 MG PO TABS
1.0000 | ORAL_TABLET | Freq: Every day | ORAL | 1 refills | Status: DC
Start: 2016-07-27 — End: 2017-01-31

## 2016-08-06 ENCOUNTER — Telehealth: Payer: Self-pay

## 2016-08-06 DIAGNOSIS — E875 Hyperkalemia: Secondary | ICD-10-CM

## 2016-08-06 NOTE — Telephone Encounter (Signed)
Labs ordered. May need to increase phenytoin.

## 2016-08-06 NOTE — Telephone Encounter (Signed)
[name] GarbeBrenda Cruz DPR signed wanted to review pts med list; pts mom had spoken with someone but was confused about what to do. Pt said pt has been taking Phenytoin 100 mg ED caps po tid; pt taking atorvastatin 80 mg one daily and pt has not been taking lisinopril-HCTZ 10-12.5 mg. Steward Drone([name] will pick up lisinopril HCTZ) Steward DroneBrenda said pt's mother was not sure what was said at last phone call (lab result note).Steward DroneBrenda also scheduled pt for lab appt on 08/08/16 at 4 pm. Please place lab order. Steward DroneBrenda will wait for phone call after lab results on 08/08/16.

## 2016-08-08 ENCOUNTER — Other Ambulatory Visit (INDEPENDENT_AMBULATORY_CARE_PROVIDER_SITE_OTHER): Payer: Medicare Other

## 2016-08-08 DIAGNOSIS — E875 Hyperkalemia: Secondary | ICD-10-CM

## 2016-08-09 LAB — BASIC METABOLIC PANEL
BUN: 12 mg/dL (ref 6–23)
CALCIUM: 9.7 mg/dL (ref 8.4–10.5)
CO2: 29 meq/L (ref 19–32)
Chloride: 98 mEq/L (ref 96–112)
Creatinine, Ser: 0.81 mg/dL (ref 0.40–1.50)
GFR: 102.88 mL/min (ref >60.00)
Glucose, Bld: 97 mg/dL (ref 70–99)
POTASSIUM: 5.2 meq/L — AB (ref 3.5–5.1)
Sodium: 134 mEq/L — ABNORMAL LOW (ref 135–145)

## 2016-08-12 ENCOUNTER — Encounter: Payer: Self-pay | Admitting: *Deleted

## 2016-09-30 ENCOUNTER — Encounter: Payer: Self-pay | Admitting: Family Medicine

## 2016-10-22 ENCOUNTER — Ambulatory Visit: Payer: Medicare Other

## 2016-10-25 ENCOUNTER — Encounter: Payer: Medicare Other | Admitting: Family Medicine

## 2016-11-12 ENCOUNTER — Ambulatory Visit: Payer: Medicare Other

## 2016-11-12 ENCOUNTER — Other Ambulatory Visit: Payer: Self-pay | Admitting: Family Medicine

## 2016-11-12 DIAGNOSIS — E785 Hyperlipidemia, unspecified: Secondary | ICD-10-CM

## 2016-11-12 DIAGNOSIS — D649 Anemia, unspecified: Secondary | ICD-10-CM

## 2016-11-12 DIAGNOSIS — Z125 Encounter for screening for malignant neoplasm of prostate: Secondary | ICD-10-CM

## 2016-11-12 DIAGNOSIS — R569 Unspecified convulsions: Secondary | ICD-10-CM

## 2016-11-12 DIAGNOSIS — F1090 Alcohol use, unspecified, uncomplicated: Secondary | ICD-10-CM

## 2016-11-12 DIAGNOSIS — Z7289 Other problems related to lifestyle: Secondary | ICD-10-CM

## 2016-11-15 ENCOUNTER — Ambulatory Visit: Payer: Medicare Other

## 2016-11-20 ENCOUNTER — Encounter: Payer: Medicare Other | Admitting: Family Medicine

## 2016-12-19 ENCOUNTER — Other Ambulatory Visit: Payer: Medicare Other

## 2016-12-19 ENCOUNTER — Encounter: Payer: Medicare Other | Admitting: Family Medicine

## 2017-01-28 ENCOUNTER — Ambulatory Visit (INDEPENDENT_AMBULATORY_CARE_PROVIDER_SITE_OTHER): Payer: Medicare Other | Admitting: Family Medicine

## 2017-01-28 ENCOUNTER — Encounter: Payer: Self-pay | Admitting: Family Medicine

## 2017-01-28 ENCOUNTER — Other Ambulatory Visit (INDEPENDENT_AMBULATORY_CARE_PROVIDER_SITE_OTHER): Payer: Medicare Other

## 2017-01-28 VITALS — BP 122/80 | HR 71 | Temp 98.0°F | Ht 62.0 in | Wt 172.5 lb

## 2017-01-28 DIAGNOSIS — R569 Unspecified convulsions: Secondary | ICD-10-CM | POA: Diagnosis not present

## 2017-01-28 DIAGNOSIS — Z7189 Other specified counseling: Secondary | ICD-10-CM | POA: Diagnosis not present

## 2017-01-28 DIAGNOSIS — Z0000 Encounter for general adult medical examination without abnormal findings: Secondary | ICD-10-CM

## 2017-01-28 DIAGNOSIS — E669 Obesity, unspecified: Secondary | ICD-10-CM

## 2017-01-28 DIAGNOSIS — Z7289 Other problems related to lifestyle: Secondary | ICD-10-CM

## 2017-01-28 DIAGNOSIS — Z1211 Encounter for screening for malignant neoplasm of colon: Secondary | ICD-10-CM

## 2017-01-28 DIAGNOSIS — F1090 Alcohol use, unspecified, uncomplicated: Secondary | ICD-10-CM

## 2017-01-28 DIAGNOSIS — I6930 Unspecified sequelae of cerebral infarction: Secondary | ICD-10-CM | POA: Diagnosis not present

## 2017-01-28 DIAGNOSIS — E7289 Other specified disorders of amino-acid metabolism: Secondary | ICD-10-CM

## 2017-01-28 DIAGNOSIS — I10 Essential (primary) hypertension: Secondary | ICD-10-CM

## 2017-01-28 DIAGNOSIS — E785 Hyperlipidemia, unspecified: Secondary | ICD-10-CM

## 2017-01-28 DIAGNOSIS — R4189 Other symptoms and signs involving cognitive functions and awareness: Secondary | ICD-10-CM

## 2017-01-28 DIAGNOSIS — Z125 Encounter for screening for malignant neoplasm of prostate: Secondary | ICD-10-CM

## 2017-01-28 DIAGNOSIS — D649 Anemia, unspecified: Secondary | ICD-10-CM | POA: Diagnosis not present

## 2017-01-28 DIAGNOSIS — G9389 Other specified disorders of brain: Secondary | ICD-10-CM

## 2017-01-28 LAB — CBC WITH DIFFERENTIAL/PLATELET
BASOS PCT: 0.9 % (ref 0.0–3.0)
Basophils Absolute: 0.1 10*3/uL (ref 0.0–0.1)
EOS ABS: 0.2 10*3/uL (ref 0.0–0.7)
Eosinophils Relative: 2.2 % (ref 0.0–5.0)
HEMATOCRIT: 38.4 % — AB (ref 39.0–52.0)
HEMOGLOBIN: 12.7 g/dL — AB (ref 13.0–17.0)
LYMPHS PCT: 30.3 % (ref 12.0–46.0)
Lymphs Abs: 2.4 10*3/uL (ref 0.7–4.0)
MCHC: 33.1 g/dL (ref 30.0–36.0)
MCV: 95.7 fl (ref 78.0–100.0)
Monocytes Absolute: 1 10*3/uL (ref 0.1–1.0)
Monocytes Relative: 12.8 % — ABNORMAL HIGH (ref 3.0–12.0)
Neutro Abs: 4.4 10*3/uL (ref 1.4–7.7)
Neutrophils Relative %: 53.8 % (ref 43.0–77.0)
Platelets: 313 10*3/uL (ref 150.0–400.0)
RBC: 4.01 Mil/uL — ABNORMAL LOW (ref 4.22–5.81)
RDW: 13 % (ref 11.5–15.5)
WBC: 8.1 10*3/uL (ref 4.0–10.5)

## 2017-01-28 LAB — COMPREHENSIVE METABOLIC PANEL
ALBUMIN: 4.1 g/dL (ref 3.5–5.2)
ALK PHOS: 64 U/L (ref 39–117)
ALT: 19 U/L (ref 0–53)
AST: 21 U/L (ref 0–37)
BILIRUBIN TOTAL: 0.3 mg/dL (ref 0.2–1.2)
BUN: 10 mg/dL (ref 6–23)
CALCIUM: 9.6 mg/dL (ref 8.4–10.5)
CO2: 31 mEq/L (ref 19–32)
CREATININE: 0.66 mg/dL (ref 0.40–1.50)
Chloride: 98 mEq/L (ref 96–112)
GFR: 130.1 mL/min (ref >60.00)
Glucose, Bld: 111 mg/dL — ABNORMAL HIGH (ref 70–99)
Potassium: 5.3 mEq/L — ABNORMAL HIGH (ref 3.5–5.1)
Sodium: 137 mEq/L (ref 135–145)
Total Protein: 6.5 g/dL (ref 6.0–8.3)

## 2017-01-28 LAB — LIPID PANEL
CHOLESTEROL: 179 mg/dL (ref 0–200)
HDL: 62.5 mg/dL (ref >39.00)
LDL Cholesterol: 92 mg/dL (ref 0–99)
NonHDL: 116
TRIGLYCERIDES: 118 mg/dL (ref 0.0–149.0)
Total CHOL/HDL Ratio: 3
VLDL: 23.6 mg/dL (ref 0.0–40.0)

## 2017-01-28 LAB — FERRITIN: FERRITIN: 259.8 ng/mL (ref 22.0–322.0)

## 2017-01-28 LAB — VITAMIN B12: VITAMIN B 12: 304 pg/mL (ref 211–911)

## 2017-01-28 LAB — PSA: PSA: 0.51 ng/mL (ref 0.10–4.00)

## 2017-01-28 LAB — FOLATE: Folate: 15.1 ng/mL (ref >5.9)

## 2017-01-28 NOTE — Assessment & Plan Note (Signed)
Chronic, stable. Continue current regimen. 

## 2017-01-28 NOTE — Assessment & Plan Note (Signed)

## 2017-01-28 NOTE — Assessment & Plan Note (Addendum)
Endorses compliance with TID phentyoin. No recent seizure activity Levels pending.

## 2017-01-28 NOTE — Assessment & Plan Note (Signed)
Encouraged healthy diet and lifestyle.  

## 2017-01-28 NOTE — Assessment & Plan Note (Signed)
Advanced directive received and scanned 07/2016. Sister [name] Cruz is general POA. Discussed with patient - ok with CPR but doesn't want intubation or feeding tube.

## 2017-01-28 NOTE — Assessment & Plan Note (Signed)
Preventative protocols reviewed and updated unless pt declined. Discussed healthy diet and lifestyle.  

## 2017-01-28 NOTE — Assessment & Plan Note (Signed)
Chronic, stable. Continue statin, bp control and aspirin.

## 2017-01-28 NOTE — Assessment & Plan Note (Signed)
Chronic. Continue to encourage cessation. Limited insight after stroke.

## 2017-01-28 NOTE — Patient Instructions (Addendum)
Pass by lab to pick up stool kit.  If interested, check with pharmacy about new 2 shot shingles series (shingrix).  You are doing well today. Continue current medicines. Return in 1 year for next medicare wellness visit and physical.   Health Maintenance, Male A healthy lifestyle and preventive care is important for your health and wellness. Ask your health care provider about what schedule of regular examinations is right for you. What should I know about weight and diet? Eat a Healthy Diet  Eat plenty of vegetables, fruits, whole grains, low-fat dairy products, and lean protein.  Do not eat a lot of foods high in solid fats, added sugars, or salt.  Maintain a Healthy Weight Regular exercise can help you achieve or maintain a healthy weight. You should:  Do at least 150 minutes of exercise each week. The exercise should increase your heart rate and make you sweat (moderate-intensity exercise).  Do strength-training exercises at least twice a week.  Watch Your Levels of Cholesterol and Blood Lipids  Have your blood tested for lipids and cholesterol every 5 years starting at 62 years of age. If you are at high risk for heart disease, you should start having your blood tested when you are 62 years old. You may need to have your cholesterol levels checked more often if: ? Your lipid or cholesterol levels are high. ? You are older than 62 years of age. ? You are at high risk for heart disease.  What should I know about cancer screening? Many types of cancers can be detected early and may often be prevented. Lung Cancer  You should be screened every year for lung cancer if: ? You are a current smoker who has smoked for at least 30 years. ? You are a former smoker who has quit within the past 15 years.  Talk to your health care provider about your screening options, when you should start screening, and how often you should be screened.  Colorectal Cancer  Routine colorectal cancer  screening usually begins at 62 years of age and should be repeated every 5-10 years until you are 62 years old. You may need to be screened more often if early forms of precancerous polyps or small growths are found. Your health care provider may recommend screening at an earlier age if you have risk factors for colon cancer.  Your health care provider may recommend using home test kits to check for hidden blood in the stool.  A small camera at the end of a tube can be used to examine your colon (sigmoidoscopy or colonoscopy). This checks for the earliest forms of colorectal cancer.  Prostate and Testicular Cancer  Depending on your age and overall health, your health care provider may do certain tests to screen for prostate and testicular cancer.  Talk to your health care provider about any symptoms or concerns you have about testicular or prostate cancer.  Skin Cancer  Check your skin from head to toe regularly.  Tell your health care provider about any new moles or changes in moles, especially if: ? There is a change in a mole's size, shape, or color. ? You have a mole that is larger than a pencil eraser.  Always use sunscreen. Apply sunscreen liberally and repeat throughout the day.  Protect yourself by wearing long sleeves, pants, a wide-brimmed hat, and sunglasses when outside.  What should I know about heart disease, diabetes, and high blood pressure?  If you are 49-81 years of age,  have your blood pressure checked every 3-5 years. If you are 45 years of age or older, have your blood pressure checked every year. You should have your blood pressure measured twice-once when you are at a hospital or clinic, and once when you are not at a hospital or clinic. Record the average of the two measurements. To check your blood pressure when you are not at a hospital or clinic, you can use: ? An automated blood pressure machine at a pharmacy. ? A home blood pressure monitor.  Talk to your  health care provider about your target blood pressure.  If you are between 74-52 years old, ask your health care provider if you should take aspirin to prevent heart disease.  Have regular diabetes screenings by checking your fasting blood sugar level. ? If you are at a normal weight and have a low risk for diabetes, have this test once every three years after the age of 79. ? If you are overweight and have a high risk for diabetes, consider being tested at a younger age or more often.  A one-time screening for abdominal aortic aneurysm (AAA) by ultrasound is recommended for men aged 84-75 years who are current or former smokers. What should I know about preventing infection? Hepatitis B If you have a higher risk for hepatitis B, you should be screened for this virus. Talk with your health care provider to find out if you are at risk for hepatitis B infection. Hepatitis C Blood testing is recommended for:  Everyone born from [birth year] through 1965.  Anyone with known risk factors for hepatitis C.  Sexually Transmitted Diseases (STDs)  You should be screened each year for STDs including gonorrhea and chlamydia if: ? You are sexually active and are younger than 62 years of age. ? You are older than 62 years of age and your health care provider tells you that you are at risk for this type of infection. ? Your sexual activity has changed since you were last screened and you are at an increased risk for chlamydia or gonorrhea. Ask your health care provider if you are at risk.  Talk with your health care provider about whether you are at high risk of being infected with HIV. Your health care provider may recommend a prescription medicine to help prevent HIV infection.  What else can I do?  Schedule regular health, dental, and eye exams.  Stay current with your vaccines (immunizations).  Do not use any tobacco products, such as cigarettes, chewing tobacco, and e-cigarettes. If you need help  quitting, ask your health care provider.  Limit alcohol intake to no more than 2 drinks per day. One drink equals 12 ounces of beer, 5 ounces of wine, or 1 ounces of hard liquor.  Do not use street drugs.  Do not share needles.  Ask your health care provider for help if you need support or information about quitting drugs.  Tell your health care provider if you often feel depressed.  Tell your health care provider if you have ever been abused or do not feel safe at home. This information is not intended to replace advice given to you by your health care provider. Make sure you discuss any questions you have with your health care provider. Document Released: 06/29/2007 Document Revised: 08/30/2015 Document Reviewed: 10/04/2014 Elsevier Interactive Patient Education  [employer].

## 2017-01-28 NOTE — Assessment & Plan Note (Signed)
Chronic. Compliant with lipitor daily in am - may forget if he changes to night time. Update FLP today. The ASCVD Risk score Denman George(Goff DC Jr., et al., 2013) failed to calculate for the following reasons:   The valid total cholesterol range is 130 to 320 mg/dL

## 2017-01-28 NOTE — Progress Notes (Signed)
BP 122/80 (BP Location: Left Arm, Patient Position: Sitting, Cuff Size: Normal)   Pulse 71   Temp 98 F (36.7 C) (Oral)   Ht 5\' 2"  (1.575 m)   Wt 172 lb 8 oz (78.2 kg)   SpO2 97%   BMI 31.55 kg/m    CC: CPE Subjective:    Patient ID: [name] Cruz, male    DOB: 07-May-1955, 62 y.o.   MRN: [id number]  HPI: [name] Cruz is a 62 y.o. male presenting on 01/28/2017 for Medicare Wellness   Here with mother Dot who drove him here. Does not drive.   H/o hemorrhagic stroke 2007 - from uncontrolled hypertension. Residual congitive impairment from encephalomalacia. Lives in [place] with older handicapped sister.   Hearing screen - passed Vision screen - failed on left (last year failed on right) h/o R vision trouble.  Fall risk screen - passed Depression screen - passed  Preventative: Colon cancer screening - iFOB yearly Prostate cancer screening - reassuring last year - rpt next year Flu shot yearly  Td - 2014  Pneumovax - 2014 shingrix - discussed, declines  Advanced directive received and scanned 07/2016. Sister Lynnae PrudeBrenda Boyd is general POA. Discussed with patient - ok with CPR but doesn't want intubation or feeding tube.  Seat belt use discussed Sunscreen use and skin screen discussed No smoking.  Alcohol - 4-6 beers a day when he can afford it.   Caffeine: 1 pot coffee Lives with oldest sister Occupation: disabled, was Corporate investment bankerconstruction worker Does not drive. Activity: no regular exercise Diet: good water, drinks V8 juice  Relevant past medical, surgical, family and social history reviewed and updated as indicated. Interim medical history since our last visit reviewed. Allergies and medications reviewed and updated. Outpatient Medications Prior to Visit  Medication Sig Dispense Refill  . aspirin EC 81 MG tablet Take 81 mg by mouth daily.    Marland Kitchen. atorvastatin (LIPITOR) 80 MG tablet TAKE 1 TABLET (80 MG TOTAL) BY MOUTH DAILY. FOR CHOLESTEROL 90 tablet 3  .  lisinopril-hydrochlorothiazide (PRINZIDE,ZESTORETIC) 10-12.5 MG tablet Take 1 tablet by mouth daily. 90 tablet 1  . phenytoin (DILANTIN) 100 MG ER capsule TAKE ONE CAPSULE BY MOUTH 3 TIMES A DAY 270 capsule 3   No facility-administered medications prior to visit.      Per HPI unless specifically indicated in ROS section below Review of Systems  Constitutional: Negative for activity change, appetite change, chills, fatigue, fever and unexpected weight change.  HENT: Negative for hearing loss.   Eyes: Negative for visual disturbance.  Respiratory: Negative for cough, chest tightness, shortness of breath and wheezing.   Cardiovascular: Negative for chest pain, palpitations and leg swelling.  Gastrointestinal: Negative for abdominal distention, abdominal pain, blood in stool, constipation, diarrhea, nausea and vomiting.  Genitourinary: Negative for difficulty urinating and hematuria.  Musculoskeletal: Negative for arthralgias, myalgias and neck pain.  Skin: Negative for rash.  Neurological: Negative for dizziness, seizures, syncope and headaches.  Hematological: Negative for adenopathy. Does not bruise/bleed easily.  Psychiatric/Behavioral: Negative for dysphoric mood. The patient is not nervous/anxious.        Objective:    BP 122/80 (BP Location: Left Arm, Patient Position: Sitting, Cuff Size: Normal)   Pulse 71   Temp 98 F (36.7 C) (Oral)   Ht 5\' 2"  (1.575 m)   Wt 172 lb 8 oz (78.2 kg)   SpO2 97%   BMI 31.55 kg/m   Wt Readings from Last 3 Encounters:  01/28/17 172 lb 8 oz (  78.2 kg)  07/22/16 171 lb (77.6 kg)  04/19/16 176 lb 12 oz (80.2 kg)    Physical Exam  Constitutional: He is oriented to person, place, and time. He appears well-developed and well-nourished. No distress.  HENT:  Head: Normocephalic and atraumatic.  Right Ear: Hearing, tympanic membrane, external ear and ear canal normal.  Left Ear: Hearing, tympanic membrane, external ear and ear canal normal.  Nose:  Nose normal.  Mouth/Throat: Uvula is midline, oropharynx is clear and moist and mucous membranes are normal. No oropharyngeal exudate, posterior oropharyngeal edema or posterior oropharyngeal erythema.  Eyes: Conjunctivae and EOM are normal. Pupils are equal, round, and reactive to light. No scleral icterus.  Neck: Normal range of motion. Neck supple. Carotid bruit is not present. No thyromegaly present.  Cardiovascular: Normal rate, regular rhythm, normal heart sounds and intact distal pulses.  No murmur heard. Pulses:      Radial pulses are 2+ on the right side, and 2+ on the left side.  Pulmonary/Chest: Effort normal and breath sounds normal. No respiratory distress. He has no wheezes. He has no rales.  Abdominal: Soft. Bowel sounds are normal. He exhibits no distension and no mass. There is no tenderness. There is no rebound and no guarding.  Genitourinary: Rectum normal and prostate normal. Rectal exam shows no external hemorrhoid, no internal hemorrhoid, no fissure, no mass, no tenderness and anal tone normal. Prostate is not enlarged (15gm) and not tender.  Musculoskeletal: Normal range of motion. He exhibits no edema.  Lymphadenopathy:    He has no cervical adenopathy.  Neurological: He is alert and oriented to person, place, and time.  CN grossly intact, station and gait intact Recall 0/3, 2/3 with cue Calculation - unable to do  Skin: Skin is warm and dry. No rash noted.  Psychiatric: He has a normal mood and affect. His behavior is normal. Judgment and thought content normal.  Nursing note and vitals reviewed.  Results for orders placed or performed in visit on 08/08/16  Basic metabolic panel  Result Value Ref Range   Sodium 134 (L) 135 - 145 mEq/L   Potassium 5.2 (H) 3.5 - 5.1 mEq/L   Chloride 98 96 - 112 mEq/L   CO2 29 19 - 32 mEq/L   Glucose, Bld 97 70 - 99 mg/dL   BUN 12 6 - 23 mg/dL   Creatinine, Ser 1.61 0.40 - 1.50 mg/dL   Calcium 9.7 8.4 - 09.6 mg/dL   GFR 045.40  >98.11 mL/min      Assessment & Plan:   Problem List Items Addressed This Visit    Advanced care planning/counseling discussion    Advanced directive received and scanned 07/2016. Sister Lynnae Prude is general POA. Discussed with patient - ok with CPR but doesn't want intubation or feeding tube.       Cognitive impairment   Encephalomalacia   Habitual alcohol use    Chronic. Continue to encourage cessation. Limited insight after stroke.       Health maintenance examination    Preventative protocols reviewed and updated unless pt declined. Discussed healthy diet and lifestyle.       History of stroke with current residual effects    Chronic, stable. Continue statin, bp control and aspirin.      HLD (hyperlipidemia)    Chronic. Compliant with lipitor daily in am - may forget if he changes to night time. Update FLP today. The ASCVD Risk score Denman George DC Jr., et al., 2013) failed to calculate for the  following reasons:   The valid total cholesterol range is 130 to 320 mg/dL       HTN (hypertension)    Chronic, stable. Continue current regimen.       Medicare annual wellness visit, subsequent - Primary    I have personally reviewed the Medicare Annual Wellness questionnaire and have noted 1. The patient's medical and social history 2. Their use of alcohol, tobacco or illicit drugs 3. Their current medications and supplements 4. The patient's functional ability including ADL's, fall risks, home safety risks and hearing or visual impairment. Cognitive function has been assessed and addressed as indicated.  5. Diet and physical activity 6. Evidence for depression or mood disorders The patients weight, height, BMI have been recorded in the chart. I have made referrals, counseling and provided education to the patient based on review of the above and I have provided the pt with a written personalized care plan for preventive services. Provider list updated.. See scanned questionairre  as needed for further documentation. Reviewed preventative protocols and updated unless pt declined.       Obesity, Class I, BMI 30-34.9    Encouraged healthy diet and lifestyle.       Seizures (HCC)    Endorses compliance with TID phentyoin. No recent seizure activity Levels pending.        Other Visit Diagnoses    Special screening for malignant neoplasms, colon       Relevant Orders   Fecal occult blood, imunochemical       Follow up plan: Return in about 1 year (around 01/28/2018) for annual exam, prior fasting for blood work, medicare wellness visit.  [name], MD

## 2017-01-31 ENCOUNTER — Other Ambulatory Visit: Payer: Self-pay | Admitting: Family Medicine

## 2017-01-31 LAB — PHENYTOIN LEVEL, FREE: Phenytoin, Free: NOT DETECTED ug/mL (ref 1.0–2.0)

## 2017-02-02 ENCOUNTER — Other Ambulatory Visit: Payer: Self-pay | Admitting: Family Medicine

## 2017-02-02 MED ORDER — HYDROCHLOROTHIAZIDE 25 MG PO TABS: 25.0000 mg | ORAL_TABLET | Freq: Every day | ORAL | 1 refills | Status: DC

## 2017-02-13 ENCOUNTER — Other Ambulatory Visit (INDEPENDENT_AMBULATORY_CARE_PROVIDER_SITE_OTHER): Payer: Medicare Other

## 2017-02-13 DIAGNOSIS — Z1211 Encounter for screening for malignant neoplasm of colon: Secondary | ICD-10-CM | POA: Diagnosis not present

## 2017-02-14 LAB — FECAL OCCULT BLOOD, GUAIAC: Fecal Occult Blood: NEGATIVE

## 2017-02-14 LAB — FECAL OCCULT BLOOD, IMMUNOCHEMICAL: FECAL OCCULT BLD: NEGATIVE

## 2017-02-17 ENCOUNTER — Encounter: Payer: Self-pay | Admitting: Family Medicine

## 2017-02-20 ENCOUNTER — Inpatient Hospital Stay (HOSPITAL_COMMUNITY)
Admission: EM | Admit: 2017-02-20 | Discharge: 2017-03-02 | DRG: 233 | Disposition: A | Discharge status: Home / Self Care | Payer: Medicare Other | Attending: Cardiothoracic Surgery | Admitting: Cardiothoracic Surgery

## 2017-02-20 ENCOUNTER — Encounter (HOSPITAL_COMMUNITY): Payer: Self-pay | Admitting: Emergency Medicine

## 2017-02-20 ENCOUNTER — Other Ambulatory Visit: Payer: Self-pay

## 2017-02-20 DIAGNOSIS — R569 Unspecified convulsions: Secondary | ICD-10-CM | POA: Diagnosis present

## 2017-02-20 DIAGNOSIS — Z820 Family history of epilepsy and other diseases of the nervous system: Secondary | ICD-10-CM

## 2017-02-20 DIAGNOSIS — Z7982 Long term (current) use of aspirin: Secondary | ICD-10-CM

## 2017-02-20 DIAGNOSIS — I5043 Acute on chronic combined systolic (congestive) and diastolic (congestive) heart failure: Secondary | ICD-10-CM | POA: Diagnosis present

## 2017-02-20 DIAGNOSIS — E669 Obesity, unspecified: Secondary | ICD-10-CM | POA: Diagnosis present

## 2017-02-20 DIAGNOSIS — I69919 Unspecified symptoms and signs involving cognitive functions following unspecified cerebrovascular disease: Secondary | ICD-10-CM | POA: Diagnosis not present

## 2017-02-20 DIAGNOSIS — I214 Non-ST elevation (NSTEMI) myocardial infarction: Principal | ICD-10-CM

## 2017-02-20 DIAGNOSIS — I110 Hypertensive heart disease with heart failure: Secondary | ICD-10-CM | POA: Diagnosis present

## 2017-02-20 DIAGNOSIS — Z811 Family history of alcohol abuse and dependence: Secondary | ICD-10-CM

## 2017-02-20 DIAGNOSIS — E785 Hyperlipidemia, unspecified: Secondary | ICD-10-CM | POA: Diagnosis present

## 2017-02-20 DIAGNOSIS — I471 Supraventricular tachycardia: Secondary | ICD-10-CM | POA: Diagnosis not present

## 2017-02-20 DIAGNOSIS — D62 Acute posthemorrhagic anemia: Secondary | ICD-10-CM | POA: Diagnosis not present

## 2017-02-20 DIAGNOSIS — Z79899 Other long term (current) drug therapy: Secondary | ICD-10-CM | POA: Diagnosis not present

## 2017-02-20 DIAGNOSIS — I2582 Chronic total occlusion of coronary artery: Secondary | ICD-10-CM | POA: Diagnosis not present

## 2017-02-20 DIAGNOSIS — I25119 Atherosclerotic heart disease of native coronary artery with unspecified angina pectoris: Secondary | ICD-10-CM | POA: Diagnosis present

## 2017-02-20 DIAGNOSIS — R079 Chest pain, unspecified: Secondary | ICD-10-CM

## 2017-02-20 DIAGNOSIS — I4891 Unspecified atrial fibrillation: Secondary | ICD-10-CM

## 2017-02-20 DIAGNOSIS — R0602 Shortness of breath: Secondary | ICD-10-CM | POA: Diagnosis not present

## 2017-02-20 DIAGNOSIS — I340 Nonrheumatic mitral (valve) insufficiency: Secondary | ICD-10-CM | POA: Diagnosis not present

## 2017-02-20 DIAGNOSIS — R0789 Other chest pain: Secondary | ICD-10-CM | POA: Diagnosis not present

## 2017-02-20 DIAGNOSIS — Z951 Presence of aortocoronary bypass graft: Secondary | ICD-10-CM | POA: Diagnosis not present

## 2017-02-20 DIAGNOSIS — I252 Old myocardial infarction: Secondary | ICD-10-CM

## 2017-02-20 DIAGNOSIS — I25110 Atherosclerotic heart disease of native coronary artery with unstable angina pectoris: Secondary | ICD-10-CM | POA: Diagnosis not present

## 2017-02-20 DIAGNOSIS — Z87891 Personal history of nicotine dependence: Secondary | ICD-10-CM | POA: Diagnosis not present

## 2017-02-20 DIAGNOSIS — Z807 Family history of other malignant neoplasms of lymphoid, hematopoietic and related tissues: Secondary | ICD-10-CM | POA: Diagnosis not present

## 2017-02-20 DIAGNOSIS — Z6834 Body mass index (BMI) 34.0-34.9, adult: Secondary | ICD-10-CM

## 2017-02-20 DIAGNOSIS — I10 Essential (primary) hypertension: Secondary | ICD-10-CM | POA: Diagnosis present

## 2017-02-20 DIAGNOSIS — R339 Retention of urine, unspecified: Secondary | ICD-10-CM | POA: Diagnosis not present

## 2017-02-20 DIAGNOSIS — I9581 Postprocedural hypotension: Secondary | ICD-10-CM | POA: Diagnosis not present

## 2017-02-20 DIAGNOSIS — G40909 Epilepsy, unspecified, not intractable, without status epilepticus: Secondary | ICD-10-CM

## 2017-02-20 DIAGNOSIS — I2510 Atherosclerotic heart disease of native coronary artery without angina pectoris: Secondary | ICD-10-CM

## 2017-02-20 DIAGNOSIS — Z9889 Other specified postprocedural states: Secondary | ICD-10-CM

## 2017-02-20 DIAGNOSIS — Z9689 Presence of other specified functional implants: Secondary | ICD-10-CM

## 2017-02-20 DIAGNOSIS — I481 Persistent atrial fibrillation: Secondary | ICD-10-CM | POA: Diagnosis not present

## 2017-02-20 LAB — BASIC METABOLIC PANEL
ANION GAP: 14 (ref 5–15)
BUN: 6 mg/dL (ref 6–20)
CO2: 26 mmol/L (ref 22–32)
Calcium: 9.4 mg/dL (ref 8.9–10.3)
Chloride: 93 mmol/L — ABNORMAL LOW (ref 101–111)
Creatinine, Ser: 0.8 mg/dL (ref 0.61–1.24)
Glucose, Bld: 121 mg/dL — ABNORMAL HIGH (ref 65–99)
POTASSIUM: 3.5 mmol/L (ref 3.5–5.1)
Sodium: 133 mmol/L — ABNORMAL LOW (ref 135–145)

## 2017-02-20 LAB — CBC
HEMATOCRIT: 38.7 % — AB (ref 39.0–52.0)
HEMOGLOBIN: 12.8 g/dL — AB (ref 13.0–17.0)
MCH: 31.1 pg (ref 26.0–34.0)
MCHC: 33.1 g/dL (ref 30.0–36.0)
MCV: 94.2 fL (ref 78.0–100.0)
Platelets: 260 10*3/uL (ref 150–400)
RBC: 4.11 MIL/uL — AB (ref 4.22–5.81)
RDW: 13.1 % (ref 11.5–15.5)
WBC: 9.6 10*3/uL (ref 4.0–10.5)

## 2017-02-20 LAB — I-STAT TROPONIN, ED: Troponin i, poc: 0.68 ng/mL (ref 0.00–0.08)

## 2017-02-20 MED ORDER — DILTIAZEM LOAD VIA INFUSION
20.0000 mg | Freq: Once | INTRAVENOUS | Status: AC
  Administered 2017-02-21: 20 mg via INTRAVENOUS
  Filled 2017-02-20: qty 20

## 2017-02-20 MED ORDER — DILTIAZEM HCL-DEXTROSE 100-5 MG/100ML-% IV SOLN (PREMIX)
5.0000 mg/h | INTRAVENOUS | Status: DC
  Administered 2017-02-21 (×2): 5 mg/h via INTRAVENOUS
  Filled 2017-02-20: qty 100

## 2017-02-20 MED ORDER — NITROGLYCERIN 0.4 MG SL SUBL: 0.4000 mg | SUBLINGUAL_TABLET | SUBLINGUAL | Status: DC | PRN

## 2017-02-20 MED ORDER — ASPIRIN 81 MG PO CHEW
324.0000 mg | CHEWABLE_TABLET | Freq: Once | ORAL | Status: AC
  Administered 2017-02-21: 324 mg via ORAL
  Filled 2017-02-20: qty 4

## 2017-02-20 NOTE — ED Provider Notes (Signed)
[facility] EMERGENCY DEPARTMENT Provider Note   CSN: [id number] Arrival date & time: 02/20/17  2210     History   Chief Complaint Chief Complaint  Patient presents with  . Shaking  . "doesn't feel well"  . Chest Pain    HPI [name] Cruz is a 62 y.o. male.  Patient with past medical history of prior stroke with residual cognitive impairment, hypertension, hyperlipidemia, and obesity presents to the emergency department with a chief complaint of chest pain and shortness of breath.  He states that today he was outside working on his lawnmower when he began having left upper chest pain/pressure and shortness of breath.  He states that he also feels shaky.  He was brought to the emergency department by EMS for further evaluation.  He states that he still has some left-sided chest pressure and still feels short of breath.  He has not taken anything for his symptoms.  He takes baby aspirin daily, but is not otherwise anticoagulated.  He denies any history of A. fib.   The history is provided by the patient. No language interpreter was used.    Past Medical History:  Diagnosis Date  . Cognitive impairment 2007   after stroke, saw rehab but told to stop because was too upsetting to him  . History of chicken pox   . HLD (hyperlipidemia)   . HTN (hypertension)   . Obesity   . Stroke, hemorrhagic (HCC) 2007   thought 2/2 HTN (240sbp); residual cognitive impairment, loss of R peripheral field, no driving    Patient Active Problem List   Diagnosis Date Noted  . Medicare annual wellness visit, subsequent 10/17/2014  . Health maintenance examination 10/17/2014  . Advanced care planning/counseling discussion 10/17/2014  . Encephalomalacia 10/28/2012  . Seizures (HCC) 04/26/2012  . Habitual alcohol use 04/26/2012  . History of stroke with current residual effects   . Cognitive impairment   . HLD (hyperlipidemia)   . HTN (hypertension)   . Obesity, Class I, BMI  30-34.9     Past Surgical History:  Procedure Laterality Date  . ANKLE SURGERY  1990s   right foot with plate and screws       Home Medications    Prior to Admission medications   Medication Sig Start Date End Date Taking? Authorizing Provider  aspirin EC 81 MG tablet Take 81 mg by mouth daily.    [provider]  atorvastatin (LIPITOR) 80 MG tablet TAKE 1 TABLET (80 MG TOTAL) BY MOUTH DAILY. FOR CHOLESTEROL 04/19/16   [name], MD  hydrochlorothiazide (HYDRODIURIL) 25 MG tablet Take 1 tablet (25 mg total) by mouth daily. 02/02/17   [name], MD  phenytoin (DILANTIN) 100 MG ER capsule TAKE ONE CAPSULE BY MOUTH 3 TIMES A DAY 06/17/16   [name], MD    Family History Family History  Problem Relation Age of Onset  . Alzheimer's disease Maternal Grandfather   . Cancer Mother        lymphoma  . Alcohol abuse Father        smoker  . Coronary artery disease Neg Hx   . Stroke Neg Hx   . Diabetes Neg Hx     Social History Social History   Tobacco Use  . Smoking status: Never Smoker  . Smokeless tobacco: Former Engineer, water Use Topics  . Alcohol use: Yes    Alcohol/week: 8.4 oz    Types: 12 Cans of beer, 2 Shots of liquor per  week    Comment: regular alcohol use  . Drug use: No     Allergies   Patient has no known allergies.   Review of Systems Review of Systems  All other systems reviewed and are negative.    Physical Exam Updated Vital Signs BP (!) 116/100 (BP Location: Right Arm)   Pulse 75   Temp 98.7 F (37.1 C) (Oral)   Resp 16   SpO2 100%   Physical Exam  Constitutional: He is oriented to person, place, and time. He appears well-developed and well-nourished.  HENT:  Head: Normocephalic and atraumatic.  Eyes: Conjunctivae and EOM are normal. Pupils are equal, round, and reactive to light. Right eye exhibits no discharge. Left eye exhibits no discharge. No scleral icterus.  Neck: Normal range of motion. Neck  supple. No JVD present.  Cardiovascular: Normal heart sounds. An irregularly irregular rhythm present. Tachycardia present. Exam reveals no gallop and no friction rub.  No murmur heard. Pulmonary/Chest: Effort normal and breath sounds normal. No respiratory distress. He has no wheezes. He has no rales. He exhibits no tenderness.  Abdominal: Soft. He exhibits no distension and no mass. There is no tenderness. There is no rebound and no guarding.  Musculoskeletal: Normal range of motion. He exhibits no edema or tenderness.  Neurological: He is alert and oriented to person, place, and time.  Skin: Skin is warm and dry.  Psychiatric: He has a normal mood and affect. His behavior is normal. Judgment and thought content normal.  Nursing note and vitals reviewed.    ED Treatments / Results  Labs (all labs ordered are listed, but only abnormal results are displayed) Labs Reviewed  BASIC METABOLIC PANEL - Abnormal; Notable for the following components:      Result Value   Sodium 133 (*)    Chloride 93 (*)    Glucose, Bld 121 (*)    All other components within normal limits  CBC - Abnormal; Notable for the following components:   RBC 4.11 (*)    Hemoglobin 12.8 (*)    HCT 38.7 (*)    All other components within normal limits  I-STAT TROPONIN, ED - Abnormal; Notable for the following components:   Troponin i, poc 0.68 (*)    All other components within normal limits  I-STAT TROPONIN, ED    EKG  EKG Interpretation  Date/Time:  Thursday February 20 2017 22:12:36 EST Ventricular Rate:  144 PR Interval:    QRS Duration: 80 QT Interval:  304 QTC Calculation: 470 R Axis:   -37 Text Interpretation:  Atrial fibrillation with rapid ventricular response with premature ventricular or aberrantly conducted complexes Left axis deviation Anteroseptal infarct , age undetermined Marked ST abnormality, possible inferolateral subendocardial injury Abnormal ECG Confirmed by Gilda CreasePollina, Christopher J  [phone]([id number]) on 02/20/2017 11:16:02 PM       Radiology No results found.  Procedures Procedures (including critical care time) CRITICAL CARE Performed by: Roxy Horsemanobert Winston Sobczyk  Elevated troponin, administration of heparin infusion, new onset afib with RVR, administration of diltiazem. Total critical care time: 41 minutes  Critical care time was exclusive of separately billable procedures and treating other patients.  Critical care was necessary to treat or prevent imminent or life-threatening deterioration.  Critical care was time spent personally by me on the following activities: development of treatment plan with patient and/or surrogate as well as nursing, discussions with consultants, evaluation of patient's response to treatment, examination of patient, obtaining history from patient or surrogate, ordering and performing treatments and  interventions, ordering and review of laboratory studies, ordering and review of radiographic studies, pulse oximetry and re-evaluation of patient's condition.  Medications Ordered in ED Medications  aspirin chewable tablet 324 mg (not administered)  nitroGLYCERIN (NITROSTAT) SL tablet 0.4 mg (not administered)  diltiazem (CARDIZEM) 1 mg/mL load via infusion 20 mg (not administered)    And  diltiazem (CARDIZEM) 100 mg in dextrose 5% (1 mg/mL) infusion (not administered)     Initial Impression / Assessment and Plan / ED Course  I have reviewed the triage vital signs and the nursing notes.  Pertinent labs & imaging results that were available during my care of the patient were reviewed by me and considered in my medical decision making (see chart for details).     Patient with chest pressure and shortness of breath.  Noted to be in A. fib with RVR.  Troponin is mildly elevated at 0.68.  Patient is still symptomatic.  Will give aspirin, start heparin, and will start diltiazem for rate control.  Patient has no history of A. fib.  Will need cardiology  consultation.  On EKG there is some elevation in aVR and depression in V5 and V6.  Reviewed EKG with Dr. Blinda Leatherwood, who agrees with plan for cardiology consultation and starting infusions of heparin and diltiazem.  Discussed with cardiology, who will admit the patient.  Final Clinical Impressions(s) / ED Diagnoses   Final diagnoses:  NSTEMI (non-ST elevated myocardial infarction) [facility])  Atrial fibrillation with RVR [facility])    ED Discharge Orders    None       Roxy Horseman, PA-C 02/21/17 0032    Gilda Crease, MD 02/21/17 [phone]

## 2017-02-20 NOTE — ED Provider Notes (Signed)
Patient presented to the ER with shortness of breath and chest pressure.  Patient was working in his yard when symptoms began.  He is still experiencing a tightness and heaviness chest with shortness of breath and dizziness.  He feels very shaky.  Face to face Exam: HEENT - PERRLA Lungs - CTAB Heart -irregularly irregular, tachycardia, no M/R/G Abd - S/NT/ND Neuro - alert, oriented x3  Plan: Patient in atrial fibrillation with rapid ventricular response.  Additionally, has concerning deep depressions in inferior and lateral leads with accelerated rate.  His troponin is positive as well.  Will require anticoagulation, rate control, admission with cardiology evaluation.   EKG Interpretation  Date/Time:  Thursday February 20 2017 22:12:36 EST Ventricular Rate:  144 PR Interval:    QRS Duration: 80 QT Interval:  304 QTC Calculation: 470 R Axis:   -37 Text Interpretation:  Atrial fibrillation with rapid ventricular response with premature ventricular or aberrantly conducted complexes Left axis deviation Anteroseptal infarct , age undetermined Marked ST abnormality, possible inferolateral subendocardial injury Abnormal ECG Confirmed by Gilda CreasePollina, Jousha Schwandt J [phone]([id number]) on 02/20/2017 11:16:02 PM      CHA2DS2-VASc Score for Atrial Fibrillation Stroke Risk from [email or web address]  on 02/20/2017  RESULT SUMMARY: 4 points Stroke risk was 4.8% per year in >90,000 patients (the [place] Atrial Fibrillation Cohort Study) and 6.7% risk of stroke/TIA/systemic embolism.  One recommendation suggests a 0 score is "low" risk and may not require anticoagulation; a 1 score is "low-moderate" risk and should consider antiplatelet or anticoagulation, and score 2 or greater is "moderate-high" risk and should otherwise be an anticoagulation candidate.   INPUTS: Age -> 1 = 65-74 Sex -> 0 = Male <abbr title='Congestive heart failure'>CHF</abbr> history -> 0 = No Hypertension history -> 1 = Yes Stroke / TIA /  Thromboembolism history -> 2 = Yes Vascular disease history -> 0 = No Diabetes history -> 0 = No    Gilda CreasePollina, Stanislav Gervase J, MD 02/20/17 2357

## 2017-02-20 NOTE — ED Notes (Signed)
Trop = 0.68. Dr. Anitra LauthPlunkett notified.

## 2017-02-20 NOTE — ED Triage Notes (Signed)
Pt brought in [facility] from home for reports of "not feeling well" and Per EMS while en route he stated when took a deep breath his chest hurt but denies pain at this time.  BP130/96 CBG122 RR16 unable to get pulse oximetry PTA

## 2017-02-20 NOTE — ED Triage Notes (Signed)
Pt states he was working inside and after he came inside he "didn't feel well" and felt "shaky"

## 2017-02-21 ENCOUNTER — Encounter (HOSPITAL_COMMUNITY)
Admission: EM | Disposition: A | Discharge status: Home / Self Care | Payer: Self-pay | Source: Home / Self Care | Attending: Cardiothoracic Surgery

## 2017-02-21 ENCOUNTER — Other Ambulatory Visit (HOSPITAL_COMMUNITY): Payer: Medicare Other

## 2017-02-21 ENCOUNTER — Encounter (HOSPITAL_COMMUNITY): Payer: Self-pay | Admitting: Internal Medicine

## 2017-02-21 ENCOUNTER — Inpatient Hospital Stay (HOSPITAL_COMMUNITY): Payer: Medicare Other

## 2017-02-21 ENCOUNTER — Emergency Department (HOSPITAL_COMMUNITY): Payer: Medicare Other

## 2017-02-21 ENCOUNTER — Other Ambulatory Visit: Payer: Self-pay | Admitting: *Deleted

## 2017-02-21 DIAGNOSIS — I10 Essential (primary) hypertension: Secondary | ICD-10-CM

## 2017-02-21 DIAGNOSIS — Z951 Presence of aortocoronary bypass graft: Secondary | ICD-10-CM | POA: Diagnosis not present

## 2017-02-21 DIAGNOSIS — Z4682 Encounter for fitting and adjustment of non-vascular catheter: Secondary | ICD-10-CM | POA: Diagnosis not present

## 2017-02-21 DIAGNOSIS — I110 Hypertensive heart disease with heart failure: Secondary | ICD-10-CM | POA: Diagnosis present

## 2017-02-21 DIAGNOSIS — J90 Pleural effusion, not elsewhere classified: Secondary | ICD-10-CM | POA: Diagnosis not present

## 2017-02-21 DIAGNOSIS — R569 Unspecified convulsions: Secondary | ICD-10-CM | POA: Diagnosis present

## 2017-02-21 DIAGNOSIS — Z01810 Encounter for preprocedural cardiovascular examination: Secondary | ICD-10-CM

## 2017-02-21 DIAGNOSIS — Z6834 Body mass index (BMI) 34.0-34.9, adult: Secondary | ICD-10-CM | POA: Diagnosis not present

## 2017-02-21 DIAGNOSIS — I214 Non-ST elevation (NSTEMI) myocardial infarction: Secondary | ICD-10-CM | POA: Diagnosis not present

## 2017-02-21 DIAGNOSIS — J181 Lobar pneumonia, unspecified organism: Secondary | ICD-10-CM | POA: Diagnosis not present

## 2017-02-21 DIAGNOSIS — I2582 Chronic total occlusion of coronary artery: Secondary | ICD-10-CM | POA: Diagnosis present

## 2017-02-21 DIAGNOSIS — D62 Acute posthemorrhagic anemia: Secondary | ICD-10-CM | POA: Diagnosis not present

## 2017-02-21 DIAGNOSIS — I25110 Atherosclerotic heart disease of native coronary artery with unstable angina pectoris: Secondary | ICD-10-CM | POA: Diagnosis not present

## 2017-02-21 DIAGNOSIS — R0602 Shortness of breath: Secondary | ICD-10-CM | POA: Diagnosis not present

## 2017-02-21 DIAGNOSIS — Z820 Family history of epilepsy and other diseases of the nervous system: Secondary | ICD-10-CM | POA: Diagnosis not present

## 2017-02-21 DIAGNOSIS — E669 Obesity, unspecified: Secondary | ICD-10-CM | POA: Diagnosis present

## 2017-02-21 DIAGNOSIS — I9581 Postprocedural hypotension: Secondary | ICD-10-CM | POA: Diagnosis not present

## 2017-02-21 DIAGNOSIS — I340 Nonrheumatic mitral (valve) insufficiency: Secondary | ICD-10-CM

## 2017-02-21 DIAGNOSIS — I69919 Unspecified symptoms and signs involving cognitive functions following unspecified cerebrovascular disease: Secondary | ICD-10-CM | POA: Diagnosis present

## 2017-02-21 DIAGNOSIS — I481 Persistent atrial fibrillation: Secondary | ICD-10-CM

## 2017-02-21 DIAGNOSIS — I5043 Acute on chronic combined systolic (congestive) and diastolic (congestive) heart failure: Secondary | ICD-10-CM | POA: Diagnosis not present

## 2017-02-21 DIAGNOSIS — Z7982 Long term (current) use of aspirin: Secondary | ICD-10-CM | POA: Diagnosis not present

## 2017-02-21 DIAGNOSIS — I252 Old myocardial infarction: Secondary | ICD-10-CM | POA: Diagnosis not present

## 2017-02-21 DIAGNOSIS — R079 Chest pain, unspecified: Secondary | ICD-10-CM | POA: Diagnosis not present

## 2017-02-21 DIAGNOSIS — Z811 Family history of alcohol abuse and dependence: Secondary | ICD-10-CM | POA: Diagnosis not present

## 2017-02-21 DIAGNOSIS — I2510 Atherosclerotic heart disease of native coronary artery without angina pectoris: Secondary | ICD-10-CM

## 2017-02-21 DIAGNOSIS — Z87891 Personal history of nicotine dependence: Secondary | ICD-10-CM | POA: Diagnosis not present

## 2017-02-21 DIAGNOSIS — Z95811 Presence of heart assist device: Secondary | ICD-10-CM | POA: Diagnosis not present

## 2017-02-21 DIAGNOSIS — Z79899 Other long term (current) drug therapy: Secondary | ICD-10-CM | POA: Diagnosis not present

## 2017-02-21 DIAGNOSIS — I471 Supraventricular tachycardia: Secondary | ICD-10-CM | POA: Diagnosis not present

## 2017-02-21 DIAGNOSIS — E785 Hyperlipidemia, unspecified: Secondary | ICD-10-CM | POA: Diagnosis present

## 2017-02-21 DIAGNOSIS — Z807 Family history of other malignant neoplasms of lymphoid, hematopoietic and related tissues: Secondary | ICD-10-CM | POA: Diagnosis not present

## 2017-02-21 DIAGNOSIS — R339 Retention of urine, unspecified: Secondary | ICD-10-CM | POA: Diagnosis not present

## 2017-02-21 DIAGNOSIS — I4891 Unspecified atrial fibrillation: Secondary | ICD-10-CM | POA: Diagnosis not present

## 2017-02-21 DIAGNOSIS — I25119 Atherosclerotic heart disease of native coronary artery with unspecified angina pectoris: Secondary | ICD-10-CM | POA: Diagnosis not present

## 2017-02-21 DIAGNOSIS — J9811 Atelectasis: Secondary | ICD-10-CM | POA: Diagnosis not present

## 2017-02-21 HISTORY — PX: IABP INSERTION: CATH118242

## 2017-02-21 HISTORY — DX: Non-ST elevation (NSTEMI) myocardial infarction: I21.4

## 2017-02-21 HISTORY — PX: LEFT HEART CATH AND CORONARY ANGIOGRAPHY: CATH118249

## 2017-02-21 LAB — URINALYSIS, ROUTINE W REFLEX MICROSCOPIC
Bilirubin Urine: NEGATIVE
Glucose, UA: NEGATIVE mg/dL
Hgb urine dipstick: NEGATIVE
Ketones, ur: NEGATIVE mg/dL
Leukocytes, UA: NEGATIVE
Nitrite: NEGATIVE
Protein, ur: NEGATIVE mg/dL
Specific Gravity, Urine: 1.009 (ref 1.005–1.030)
pH: 7 (ref 5.0–8.0)

## 2017-02-21 LAB — SURGICAL PCR SCREEN
MRSA, PCR: NEGATIVE
Staphylococcus aureus: NEGATIVE

## 2017-02-21 LAB — SPIROMETRY WITH GRAPH
FEF 25-75 Pre: 1.42 L/sec
FEF2575-%Pred-Pre: 62 %
FEV1-%Pred-Pre: 71 %
FEV1-Pre: 1.93 L
FEV1FVC-%Pred-Pre: 98 %
FEV6-%Pred-Pre: 77 %
FEV6-Pre: 2.62 L
FEV6FVC-%Pred-Pre: 105 %
FVC-%Pred-Pre: 72 %
FVC-Pre: 2.62 L
Pre FEV1/FVC ratio: 74 %
Pre FEV6/FVC Ratio: 100 %

## 2017-02-21 LAB — TROPONIN I
TROPONIN I: 25.46 ng/mL — AB (ref <0.03)
TROPONIN I: 4.9 ng/mL — AB (ref <0.03)
Troponin I: 19.69 ng/mL (ref <0.03)

## 2017-02-21 LAB — PROTIME-INR
INR: 0.98
Prothrombin Time: 12.9 seconds (ref 11.4–15.2)

## 2017-02-21 LAB — HEPARIN LEVEL (UNFRACTIONATED): HEPARIN UNFRACTIONATED: 0.36 [IU]/mL (ref 0.30–0.70)

## 2017-02-21 LAB — ECHOCARDIOGRAM COMPLETE
Height: 62 in
Weight: 2758.4 oz

## 2017-02-21 LAB — HIV ANTIBODY (ROUTINE TESTING W REFLEX): HIV Screen 4th Generation wRfx: NONREACTIVE

## 2017-02-21 SURGERY — LEFT HEART CATH AND CORONARY ANGIOGRAPHY
Anesthesia: LOCAL

## 2017-02-21 MED ORDER — POTASSIUM CHLORIDE CRYS ER 20 MEQ PO TBCR
40.0000 meq | EXTENDED_RELEASE_TABLET | Freq: Once | ORAL | Status: AC
  Administered 2017-02-21: 40 meq via ORAL
  Filled 2017-02-21: qty 2

## 2017-02-21 MED ORDER — HEPARIN (PORCINE) IN NACL 2-0.9 UNIT/ML-% IJ SOLN
INTRAMUSCULAR | Status: AC
  Filled 2017-02-21: qty 1000

## 2017-02-21 MED ORDER — SODIUM CHLORIDE 0.9% FLUSH: 3.0000 mL | INTRAVENOUS | Status: DC | PRN

## 2017-02-21 MED ORDER — HEPARIN SODIUM (PORCINE) 1000 UNIT/ML IJ SOLN
INTRAMUSCULAR | Status: AC
  Filled 2017-02-21: qty 1

## 2017-02-21 MED ORDER — SODIUM CHLORIDE 0.9% FLUSH
3.0000 mL | Freq: Two times a day (BID) | INTRAVENOUS | Status: DC
  Administered 2017-02-22: 3 mL via INTRAVENOUS

## 2017-02-21 MED ORDER — SODIUM CHLORIDE 0.9 % WEIGHT BASED INFUSION: 3.0000 mL/kg/h | INTRAVENOUS | Status: AC

## 2017-02-21 MED ORDER — SODIUM CHLORIDE 0.9% FLUSH
3.0000 mL | Freq: Two times a day (BID) | INTRAVENOUS | Status: DC
  Administered 2017-02-22 – 2017-02-23 (×3): 3 mL via INTRAVENOUS

## 2017-02-21 MED ORDER — SODIUM CHLORIDE 0.9 % IV SOLN: 250.0000 mL | INTRAVENOUS | Status: DC | PRN

## 2017-02-21 MED ORDER — HEPARIN (PORCINE) IN NACL 100-0.45 UNIT/ML-% IJ SOLN
1000.0000 [IU]/h | INTRAMUSCULAR | Status: DC
  Administered 2017-02-21: 1000 [IU]/h via INTRAVENOUS
  Filled 2017-02-21: qty 250

## 2017-02-21 MED ORDER — AMIODARONE LOAD VIA INFUSION
150.0000 mg | Freq: Once | INTRAVENOUS | Status: AC
  Administered 2017-02-21: 150 mg via INTRAVENOUS
  Filled 2017-02-21: qty 83.34

## 2017-02-21 MED ORDER — ASPIRIN EC 81 MG PO TBEC
81.0000 mg | DELAYED_RELEASE_TABLET | Freq: Every day | ORAL | Status: DC
  Administered 2017-02-22 – 2017-02-23 (×2): 81 mg via ORAL
  Filled 2017-02-21 (×2): qty 1

## 2017-02-21 MED ORDER — HEPARIN (PORCINE) IN NACL 2-0.9 UNIT/ML-% IJ SOLN
INTRAMUSCULAR | Status: AC | PRN
  Administered 2017-02-21: 1500 mL

## 2017-02-21 MED ORDER — PHENYTOIN SODIUM EXTENDED 100 MG PO CAPS
100.0000 mg | ORAL_CAPSULE | Freq: Three times a day (TID) | ORAL | Status: DC
  Administered 2017-02-21 – 2017-02-23 (×9): 100 mg via ORAL
  Filled 2017-02-21 (×12): qty 1

## 2017-02-21 MED ORDER — SODIUM CHLORIDE 0.9 % WEIGHT BASED INFUSION: 1.0000 mL/kg/h | INTRAVENOUS | Status: DC

## 2017-02-21 MED ORDER — HEPARIN BOLUS VIA INFUSION
4000.0000 [IU] | Freq: Once | INTRAVENOUS | Status: AC
Start: 2017-02-21 — End: 2017-02-21
  Administered 2017-02-21: 4000 [IU] via INTRAVENOUS
  Filled 2017-02-21: qty 4000

## 2017-02-21 MED ORDER — VERAPAMIL HCL 2.5 MG/ML IV SOLN
INTRAVENOUS | Status: DC | PRN
  Administered 2017-02-21: 10 mL via INTRA_ARTERIAL

## 2017-02-21 MED ORDER — SODIUM CHLORIDE 0.9 % IV SOLN
INTRAVENOUS | Status: AC | PRN
  Administered 2017-02-21: 50 mL/h via INTRAVENOUS

## 2017-02-21 MED ORDER — ONDANSETRON HCL 4 MG/2ML IJ SOLN: 4.0000 mg | Freq: Four times a day (QID) | INTRAMUSCULAR | Status: DC | PRN

## 2017-02-21 MED ORDER — AMIODARONE HCL IN DEXTROSE 360-4.14 MG/200ML-% IV SOLN
30.0000 mg/h | INTRAVENOUS | Status: DC
  Administered 2017-02-22 – 2017-02-23 (×3): 30 mg/h via INTRAVENOUS
  Filled 2017-02-21: qty 400
  Filled 2017-02-21 (×6): qty 200

## 2017-02-21 MED ORDER — LIDOCAINE HCL (PF) 1 % IJ SOLN
INTRAMUSCULAR | Status: DC | PRN
  Administered 2017-02-21: 2 mL
  Administered 2017-02-21: 5 mL

## 2017-02-21 MED ORDER — HEPARIN (PORCINE) IN NACL 100-0.45 UNIT/ML-% IJ SOLN
1400.0000 [IU]/h | INTRAMUSCULAR | Status: DC
  Administered 2017-02-21: 500 [IU]/h via INTRAVENOUS
  Administered 2017-02-22: 800 [IU]/h via INTRAVENOUS
  Administered 2017-02-23: 1400 [IU]/h via INTRAVENOUS
  Filled 2017-02-21 (×3): qty 250

## 2017-02-21 MED ORDER — AMIODARONE HCL IN DEXTROSE 360-4.14 MG/200ML-% IV SOLN
60.0000 mg/h | INTRAVENOUS | Status: AC
  Administered 2017-02-21: 60 mg/h via INTRAVENOUS

## 2017-02-21 MED ORDER — VERAPAMIL HCL 2.5 MG/ML IV SOLN
INTRAVENOUS | Status: AC
Start: 2017-02-21 — End: 2017-02-21
  Filled 2017-02-21: qty 2

## 2017-02-21 MED ORDER — HEPARIN SODIUM (PORCINE) 1000 UNIT/ML IJ SOLN
INTRAMUSCULAR | Status: DC | PRN
  Administered 2017-02-21: 4000 [IU] via INTRAVENOUS

## 2017-02-21 MED ORDER — ACETAMINOPHEN 325 MG PO TABS: 650.0000 mg | ORAL_TABLET | ORAL | Status: DC | PRN

## 2017-02-21 MED ORDER — IOHEXOL 350 MG/ML SOLN
INTRAVENOUS | Status: DC | PRN
  Administered 2017-02-21: 85 mL via INTRA_ARTERIAL

## 2017-02-21 MED ORDER — LIDOCAINE HCL (PF) 1 % IJ SOLN
INTRAMUSCULAR | Status: AC
  Filled 2017-02-21: qty 30

## 2017-02-21 MED ORDER — ATORVASTATIN CALCIUM 80 MG PO TABS
80.0000 mg | ORAL_TABLET | Freq: Every day | ORAL | Status: DC
  Administered 2017-02-21 – 2017-02-23 (×3): 80 mg via ORAL
  Filled 2017-02-21 (×3): qty 1

## 2017-02-21 SURGICAL SUPPLY — 14 items
BALLN IABP SENSA PLUS 7.5F 40C (BALLOONS) ×2
BALLOON IABP SENS PLUS 7.5F40C (BALLOONS) IMPLANT
CATH IMPULSE 5F ANG/FL3.5 (CATHETERS) ×1 IMPLANT
COVER PRB 48X5XTLSCP FOLD TPE (BAG) IMPLANT
COVER PROBE 5X48 (BAG) ×2
GUIDEWIRE INQWIRE 1.5J.035X260 (WIRE) IMPLANT
INQWIRE 1.5J .035X260CM (WIRE) ×2
KIT HEART LEFT (KITS) ×2 IMPLANT
KIT MICROPUNCTURE NIT STIFF (SHEATH) ×1 IMPLANT
PACK CARDIAC CATHETERIZATION (CUSTOM PROCEDURE TRAY) ×2 IMPLANT
SHEATH PINNACLE 6F 10CM (SHEATH) ×1 IMPLANT
SYR MEDRAD MARK V 150ML (SYRINGE) ×2 IMPLANT
TRANSDUCER W/STOPCOCK (MISCELLANEOUS) ×2 IMPLANT
TUBING CIL FLEX 10 FLL-RA (TUBING) ×2 IMPLANT

## 2017-02-21 NOTE — ED Notes (Signed)
DR Abe PeopleVaransi at bedside  Aware of elevated  Trop. Of 19

## 2017-02-21 NOTE — Progress Notes (Signed)
Pre CABG Dopplers completed.technically difficult evaluation due to the balloon pump. Right ICA 1% to 39% upper end of range. Left ICA 1% to 39%. Bilateral acoustic shadowing in the proximal ICA may obscure higher velocities. ABIs indicate a mild reduction in arterial flow bilaterally at rest. Palmar arch is complete bilaterally.  [place] ElectricVirginia Daray Polgar, RVS 02/21/2017 5:56 PM

## 2017-02-21 NOTE — Consult Note (Signed)
Advanced Heart Failure Team Consult Note   Primary Physician: [name], MD PCP-Cardiologist:  No primary care provider on file.  Reason for Consultation: Acute Systolic Heart Failure   HPI:    [name] Cruz is seen today for evaluation of Acute Systolic Heart Failure at the request of Dr Tyrone Sage.   [name] Cruz is a 62 year old with a history of hemorrhagic stroke, htn, memory impairment.   Today he presented to the ED with chest pain. He presented to the ED in A fib RVR, EKG with ST elevated in AVR and diffuse ST depression . Started on heparin drip and diltiazem. Pertinent admission labs: Troponin 4.9>19.6.   Cardiology consulted and he was taken urgently to the cath lab. Severe 3 vessel disease noted., 60% LMCA stenosis, chronic total occlusion of ostial LAD, 70% proximal  LCX lesion, and 70-90% proximal, mid, and distal RCA.   Due to ongoing CP IABP placed. CT surgery consulted. Possible CABG today.   Now in 2H. Feeling better. Denies SOB/CP.   Stat ECHO ordered.    Review of Systems: [y] = yes, [ ]  = no   General: Weight gain [ ] ; Weight loss [ ] ; Anorexia [ ] ; Fatigue [Y ]; Fever [ ] ; Chills [ ] ; Weakness [ ]   Cardiac: Chest pain/pressure [Y ]; Resting SOB [ ] ; Exertional SOB [ ] ; Orthopnea [ ] ; Pedal Edema [ ] ; Palpitations [ ] ; Syncope [ ] ; Presyncope [ ] ; Paroxysmal nocturnal dyspnea[ ]   Pulmonary: Cough [ ] ; Wheezing[ ] ; Hemoptysis[ ] ; Sputum [ ] ; Snoring [ ]   GI: Vomiting[ ] ; Dysphagia[ ] ; Melena[ ] ; Hematochezia [ ] ; Heartburn[ ] ; Abdominal pain [ ] ; Constipation [ ] ; Diarrhea [ ] ; BRBPR [ ]   GU: Hematuria[ ] ; Dysuria [ ] ; Nocturia[ ]   Vascular: Pain in legs with walking [ ] ; Pain in feet with lying flat [ ] ; Non-healing sores [ ] ; Stroke [ ] ; TIA [ ] ; Slurred speech [ ] ;  Neuro: Headaches[ ] ; Vertigo[ ] ; Seizures[ ] ; Paresthesias[ ] ;Blurred vision [ ] ; Diplopia [ ] ; Vision changes [ ]   Ortho/Skin: Arthritis Cove.Etienne ]; Joint pain Cove.Etienne ]; Muscle pain [ ] ; Joint  swelling [ ] ; Back Pain [ ] ; Rash [ ]   Psych: Depression[ ] ; Anxiety[ ]   Heme: Bleeding problems [ ] ; Clotting disorders [ ] ; Anemia [ ]    Endocrine: Diabetes [ ] ; Thyroid dysfunction[ ]   Home Medications Prior to Admission medications   Medication Sig Start Date End Date Taking? Authorizing Provider  aspirin EC 81 MG tablet Take 81 mg by mouth daily.   Yes [provider]  atorvastatin (LIPITOR) 80 MG tablet TAKE 1 TABLET (80 MG TOTAL) BY MOUTH DAILY. FOR CHOLESTEROL 04/19/16  Yes [name], MD  hydrochlorothiazide (HYDRODIURIL) 25 MG tablet Take 1 tablet (25 mg total) by mouth daily. 02/02/17  Yes [name], MD  phenytoin (DILANTIN) 100 MG ER capsule TAKE ONE CAPSULE BY MOUTH 3 TIMES A DAY 06/17/16  Yes [name], MD    Past Medical History: Past Medical History:  Diagnosis Date  . Cognitive impairment 2007   after stroke, saw rehab but told to stop because was too upsetting to him  . History of chicken pox   . HLD (hyperlipidemia)   . HTN (hypertension)   . Obesity   . Stroke, hemorrhagic (HCC) 2007   thought 2/2 HTN (240sbp); residual cognitive impairment, loss of R peripheral field, no driving    Past Surgical History: Past Surgical History:  Procedure Laterality  Date  . ANKLE SURGERY  1990s   right foot with plate and screws  . IABP INSERTION N/A 02/21/2017   Procedure: IABP Insertion;  Surgeon: Yvonne Kendall, MD;  Location: [facility];  Service: Cardiovascular;  Laterality: N/A;  . LEFT HEART CATH AND CORONARY ANGIOGRAPHY N/A 02/21/2017   Procedure: LEFT HEART CATH AND CORONARY ANGIOGRAPHY;  Surgeon: Yvonne Kendall, MD;  Location: [facility] CV [facility];  Service: Cardiovascular;  Laterality: N/A;    Family History: Family History  Problem Relation Age of Onset  . Alzheimer's disease Maternal Grandfather   . Cancer Mother        lymphoma  . Alcohol abuse Father        smoker  . Coronary artery disease Neg Hx   . Stroke Neg Hx   .  Diabetes Neg Hx     Social History: Social History   Socioeconomic History  . Marital status: Single    Spouse name: None  . Number of children: None  . Years of education: None  . Highest education level: None  Social Needs  . Financial resource strain: None  . Food insecurity - worry: None  . Food insecurity - inability: None  . Transportation needs - medical: None  . Transportation needs - non-medical: None  Occupational History  . None  Tobacco Use  . Smoking status: Never Smoker  . Smokeless tobacco: Former Engineer, water and Sexual Activity  . Alcohol use: Yes    Alcohol/week: 8.4 oz    Types: 12 Cans of beer, 2 Shots of liquor per week    Comment: regular alcohol use  . Drug use: No  . Sexual activity: No  Other Topics Concern  . None  Social History Narrative   Caffeine: 1 pot coffee   Lives with oldest sister in [place]   Occupation: disabled, was Corporate investment banker   Does not drive.   Activity: no regular exercise   Diet: good water, drinks V8 juice    Allergies:  No Known Allergies  Objective:    Vital Signs:   Temp:  [98.7 F (37.1 C)] 98.7 F (37.1 C) (02/07 2221) Pulse Rate:  [33-140] 109 (02/08 1145) Resp:  [4-23] 16 (02/08 1145) BP: (91-130)/(62-100) 113/74 (02/08 1145) SpO2:  [93 %-100 %] 100 % (02/08 1145) Weight:  [172 lb 6.4 oz (78.2 kg)] 172 lb 6.4 oz (78.2 kg) (02/08 0000) Last BM Date: 02/19/17  Weight change: Filed Weights   02/21/17 0000  Weight: 172 lb 6.4 oz (78.2 kg)    Intake/Output:  No intake or output data in the 24 hours ending 02/21/17 1249    Physical Exam    General:   Lying flat in bed. No resp difficulty HEENT: normal Neck: supple. JVP 6-7 . Carotids 2+ bilat; no bruits. No lymphadenopathy or thyromegaly appreciated. Cor: PMI nondisplaced. Iregular rate & rhythm. No rubs, gallops or murmurs. Lungs: clear Abdomen: soft, nontender, nondistended. No hepatosplenomegaly. No bruits or masses. Good bowel  sounds. Extremities: no cyanosis, clubbing, rash, edema. R groin IABP. Warm Neuro: alert & orientedx3, cranial nerves grossly intact. moves all 4 extremities w/o difficulty. Affect pleasant   Telemetry   A fib RVR 90-118 bpm Personally reviewed   EKG   A fib ST elevation AVR   Labs   Basic Metabolic Panel: Recent Labs  Lab 02/20/17 2227  NA 133*  K 3.5  CL 93*  CO2 26  GLUCOSE 121*  BUN 6  CREATININE 0.80  CALCIUM 9.4  Liver Function Tests: No results for input(s): AST, ALT, ALKPHOS, BILITOT, PROT, ALBUMIN in the last 168 hours. No results for input(s): LIPASE, AMYLASE in the last 168 hours. No results for input(s): AMMONIA in the last 168 hours.  CBC: Recent Labs  Lab 02/20/17 2227  WBC 9.6  HGB 12.8*  HCT 38.7*  MCV 94.2  PLT 260    Cardiac Enzymes: Recent Labs  Lab 02/21/17 0049 02/21/17 0500  TROPONINI 4.90* 19.69*    BNP: BNP (last 3 results) No results for input(s): BNP in the last 8760 hours.  ProBNP (last 3 results) No results for input(s): PROBNP in the last 8760 hours.   CBG: No results for input(s): GLUCAP in the last 168 hours.  Coagulation Studies: Recent Labs    02/21/17 0741  LABPROT 12.9  INR 0.98     Imaging   Dg Chest Port 1 View  Result Date: 02/21/2017 CLINICAL DATA:  Status post intra-aortic balloon pump placement. EXAM: PORTABLE CHEST 1 VIEW COMPARISON:  02/21/2017 at 00:15 a.m. FINDINGS: Metallic tip of the intra-aortic balloon pump projects over the aortic knob. Lungs are essentially clear.  No other change from the prior study. IMPRESSION: 1. Metallic tip of the intra-aortic balloon pump projects over the aortic knob. 2. No acute cardiopulmonary disease. Electronically Signed   By: Amie Portland M.D.   On: 02/21/2017 12:16   Dg Chest Port 1 View  Result Date: 02/21/2017 CLINICAL DATA:  Initial evaluation for acute shortness of breath, chest pain. EXAM: PORTABLE CHEST 1 VIEW COMPARISON:  Prior radiograph from  02/11/2004. FINDINGS: Mild cardiomegaly. Mediastinal silhouette within normal limits. Tortuosity the intrathoracic aorta noted. Lungs normally inflated. Mild left basilar atelectasis and/or scarring. No focal infiltrates. No pulmonary edema or pleural effusion. No pneumothorax. No acute osseous abnormality. Degenerative changes noted about the shoulders bilaterally. IMPRESSION: 1. Mild left basilar atelectasis and/or scarring. No other active cardiopulmonary disease. 2. Mild cardiomegaly. Electronically Signed   By: Rise Mu M.D.   On: 02/21/2017 00:42      Medications:     Current Medications: . [START ON 02/22/2017] aspirin EC  81 mg Oral Daily  . atorvastatin  80 mg Oral q1800  . phenytoin  100 mg Oral TID  . sodium chloride flush  3 mL Intravenous Q12H  . sodium chloride flush  3 mL Intravenous Q12H     Infusions: . sodium chloride    . sodium chloride    . sodium chloride    . diltiazem (CARDIZEM) infusion 5 mg/hr (02/21/17 0818)  . heparin         Patient Profile   [name] Cruz is a 62 year old with a history of hemorrhagic stroke, htn, memory impairment.   Today he presented to the ED with chest pain and A fib RVR. Had LHC with multivessel cad. IABP left in place.   Assessment/Plan   1. NSTEMI Troponin trend 4.9>19.69 LHC today. Multivessel CAD. IABP left in place due to chest pain. Continue 1:1  CT surgery following. Plan for possible CABG later today. Currently NPO.   2. Acute Systolic Heart Failure  Reduced EF noted on cath. Will get stat ECHO Volume status ok.   3. A fib RVR With reduced EF. Stop diltiazem drip. Start amio drip.  Continue heparin drip.   4. HTN Stable   5. H/O Hemorrhagic Stroke -- HTN? Medication concerns reviewed with patient and pharmacy team. Barriers identified: Unknown.   Length of Stay: 0  Tonye Becket, NP  02/21/2017,  12:49 PM  Advanced Heart Failure Team Pager [phone] (M-F; 7a - 4p)  Please contact [facility]  for night-coverage after hours (4p -7a ) and weekends on [email or web address]  62 y/o male as above with h/o HTN and previous hemorrhagic CVA (thought to be HTN related). Admitted with NSTEMI in setting of AF with RVR. Cath images reviewed personally and shows severe 3v CAD. IABP placed due to ongoing angina. Now CP free. Echo done at bedside and reviewed personally EF 45% with normal RV.   On exam comfortable NAD JVP 6-7 Cor IRR tachy Lungs CTA Abs soft NT Ext warm RFA IABP   Currently stabilized with IABP in place. No further angina. Echo shows EF 45% so do not think he need PA cath at this time to further optimize. Will stop diltiazem and start amio for AF with RVR. Resume heparin. Continue ASA and statin. HF team will follow through surgery.   Arvilla Meresaniel Jorgina Binning, MD  6:02 PM

## 2017-02-21 NOTE — H&P (Addendum)
Cardiology History & Physical    Patient ID: MICKEAL DAWS MRN: [id number], DOB: Mar 09, 1955 Date of Encounter: 02/21/2017, 1:01 AM Primary Physician: [name], MD  Chief Complaint: Chest pain   HPI: [name] Cruz is a 62 y.o. male with history of hemorrhagic stroke (thought to be secondary to uncontrolled hypertension now with residual cognitive impairment), hypertension, hyperlipidemia, who presents with chest discomfort and shortness of breath since this afternoon.  Patient was doing well until earlier on the day of admission, when he was  doing work in the garden when he noticed relatively abrupt onset of substernal left-sided chest pressure, twitch which was accompanied by significant dyspnea and fatigue.  He laid down and tried to rest, but the symptoms did not resolve.  He ultimately presented to the [facility] ED for evaluation.  He denies any recent history of similar symptoms, PND, orthopnea, lower extremity edema, palpitations, syncope.  He did not have any new neurologic symptoms.  In the ED, he was noticed to be in rapid atrial fibrillation.  Initial ECG was notable for diffuse ST depressions and ST elevation in aVR.  Labs were notable for troponin of 0.68 and were otherwise unremarkable.  He was started on an IV heparin drip given a 20 mg bolus of IV diltiazem and started on IV diltiazem drip.  He was then admitted to the cardiology service for further management.  Upon my interview, he denies any active chest pain.  Of note, due to cognitive impairment his history is somewhat limited, although he is able to recount the basic series of events that led to his presentation.  Past Medical History:  Diagnosis Date  . Cognitive impairment 2007   after stroke, saw rehab but told to stop because was too upsetting to him  . History of chicken pox   . HLD (hyperlipidemia)   . HTN (hypertension)   . Obesity   . Stroke, hemorrhagic (HCC) 2007   thought 2/2 HTN (240sbp); residual  cognitive impairment, loss of R peripheral field, no driving     Surgical History:  Past Surgical History:  Procedure Laterality Date  . ANKLE SURGERY  1990s   right foot with plate and screws     Home Meds: Prior to Admission medications   Medication Sig Start Date End Date Taking? Authorizing Provider  aspirin EC 81 MG tablet Take 81 mg by mouth daily.   Yes [provider]  atorvastatin (LIPITOR) 80 MG tablet TAKE 1 TABLET (80 MG TOTAL) BY MOUTH DAILY. FOR CHOLESTEROL 04/19/16  Yes [name], MD  hydrochlorothiazide (HYDRODIURIL) 25 MG tablet Take 1 tablet (25 mg total) by mouth daily. 02/02/17  Yes [name], MD  phenytoin (DILANTIN) 100 MG ER capsule TAKE ONE CAPSULE BY MOUTH 3 TIMES A DAY 06/17/16  Yes [name], MD    Allergies: No Known Allergies  Social History   Socioeconomic History  . Marital status: Single    Spouse name: Not on file  . Number of children: Not on file  . Years of education: Not on file  . Highest education level: Not on file  Social Needs  . Financial resource strain: Not on file  . Food insecurity - worry: Not on file  . Food insecurity - inability: Not on file  . Transportation needs - medical: Not on file  . Transportation needs - non-medical: Not on file  Occupational History  . Not on file  Tobacco Use  . Smoking status: Never Smoker  .  Smokeless tobacco: Former Engineer, waterUser  Substance and Sexual Activity  . Alcohol use: Yes    Alcohol/week: 8.4 oz    Types: 12 Cans of beer, 2 Shots of liquor per week    Comment: regular alcohol use  . Drug use: No  . Sexual activity: No  Other Topics Concern  . Not on file  Social History Narrative   Caffeine: 1 pot coffee   Lives with oldest sister in [place]   Occupation: disabled, was Corporate investment bankerconstruction worker   Does not drive.   Activity: no regular exercise   Diet: good water, drinks V8 juice     Family History  Problem Relation Age of Onset  . Alzheimer's disease  Maternal Grandfather   . Cancer Mother        lymphoma  . Alcohol abuse Father        smoker  . Coronary artery disease Neg Hx   . Stroke Neg Hx   . Diabetes Neg Hx     Review of Systems: All other systems reviewed and are otherwise negative except as noted above.  Labs:   Lab Results  Component Value Date   WBC 9.6 02/20/2017   HGB 12.8 (L) 02/20/2017   HCT 38.7 (L) 02/20/2017   MCV 94.2 02/20/2017   PLT 260 02/20/2017    Recent Labs  Lab 02/20/17 2227  NA 133*  K 3.5  CL 93*  CO2 26  BUN 6  CREATININE 0.80  CALCIUM 9.4  GLUCOSE 121*   No results for input(s): CKTOTAL, CKMB, TROPONINI in the last 72 hours. Lab Results  Component Value Date   CHOL 179 01/28/2017   HDL 62.50 01/28/2017   LDLCALC 92 01/28/2017   TRIG 118.0 01/28/2017   No results found for: DDIMER  Radiology/Studies:  Dg Chest Port 1 View  Result Date: 02/21/2017 CLINICAL DATA:  Initial evaluation for acute shortness of breath, chest pain. EXAM: PORTABLE CHEST 1 VIEW COMPARISON:  Prior radiograph from 02/11/2004. FINDINGS: Mild cardiomegaly. Mediastinal silhouette within normal limits. Tortuosity the intrathoracic aorta noted. Lungs normally inflated. Mild left basilar atelectasis and/or scarring. No focal infiltrates. No pulmonary edema or pleural effusion. No pneumothorax. No acute osseous abnormality. Degenerative changes noted about the shoulders bilaterally. IMPRESSION: 1. Mild left basilar atelectasis and/or scarring. No other active cardiopulmonary disease. 2. Mild cardiomegaly. Electronically Signed   By: Rise MuBenjamin  McClintock M.D.   On: 02/21/2017 00:42   Wt Readings from Last 3 Encounters:  02/21/17 78.2 kg (172 lb 6.4 oz)  01/28/17 78.2 kg (172 lb 8 oz)  07/22/16 77.6 kg (171 lb)    EKG: Atrial fibrillation, likely Ashman's beat, diffuse ST depression and ST elevation in aVR.  Physical Exam: Blood pressure 104/70, pulse (!) 118, temperature 98.7 F (37.1 C), temperature source Oral,  resp. rate 18, height 5\' 2"  (1.575 m), weight 78.2 kg (172 lb 6.4 oz), SpO2 97 %. Body mass index is 31.53 kg/m. General: Well developed, well nourished, in no acute distress. Head: Normocephalic, atraumatic, sclera non-icteric, no xanthomas, nares are without discharge.  Neck: Negative for carotid bruits. JVD not elevated. Lungs: Clear bilaterally to auscultation without wheezes, rales, or rhonchi. Breathing is unlabored. Heart: Tachycardic, irregularly irregular, with S1 S2. No murmurs, rubs, or gallops appreciated. Abdomen: Soft, non-tender, non-distended with normoactive bowel sounds. No hepatomegaly. No rebound/guarding. No obvious abdominal masses. Msk:  Strength and tone appear normal for age. Extremities: No clubbing or cyanosis. No edema.  Distal pedal pulses are 2+ and equal bilaterally. Neuro: Alert  and oriented X 3. No focal deficit. No facial asymmetry. Moves all extremities spontaneously. Psych:  Responds to questions appropriately with a normal affect.    Assessment and Plan  62 y.o. male with history of hemorrhagic stroke (thought to be secondary to uncontrolled hypertension now with residual cognitive impairment), hypertension, hyperlipidemia, who presents with chest pain, and was found to have NSTEMI in rapid atrial fibrillation.  1. NSTEMI: Unclear if this represents a primary plaque rupture event versus demand ischemia in the setting of fixed significant coronary disease and rapid AF.  In either case, we will continue on IV heparin drip.  We will plan for cardiac catheterization in the morning to define coronary anatomy.  Echocardiogram ordered.  2.  Atrial fibrillation: We will start low-dose IV diltiazem drip for rate control.  If he runs into issues with hypotension on diltiazem, will plan to switch to IV amiodarone.  Heparin drip as above.  Echo as above.  3.  Hypertension: Normotensive on initial presentation.  IV diltiazem for heart rate control, as aforementioned.   Will hold home hydrochlorothiazide.  4.  Prior hemorrhagic stroke with cognitive impairment: Continue home phenytoin.  We will carefully monitor neurologic status with IV heparin infusion.   Signed, Esmond Plants, MD 02/21/2017, 1:01 AM   I have examined the patient and reviewed assessment and plan and discussed with patient.  Agree with above as stated.  Markedly increased troponin.  THe risks and benefits of cath were explained for the patient.  Restart IV diltiazem for rate control.  TOlerating IV heparin.  WOuld use Synergy stent given h/o Intracranial hemorrhage.    Chest discomfort is better.  Cath will be done urgently, but is not emergent at this time.  I personally reviewed the ECG and there are some diffuse ST depressions.    Will need to decide on anticoagulation given h/o ICH, which occurred in the setting of HTN.  Lance Muss

## 2017-02-21 NOTE — Progress Notes (Signed)
  Echocardiogram 2D Echocardiogram has been performed.  Delcie RochENNINGTON, Shamiya Demeritt 02/21/2017, 1:51 PM

## 2017-02-21 NOTE — Progress Notes (Signed)
ANTICOAGULATION CONSULT NOTE - Initial Consult  Pharmacy Consult for heparin Indication: chest pain/ACS  No Known Allergies  Patient Measurements: Height: 5\' 2"  (157.5 cm) Weight: 172 lb 6.4 oz (78.2 kg) IBW/kg (Calculated) : 54.6 Heparin Dosing Weight: 70kg  Vital Signs: Temp: 98.7 F (37.1 C) (02/07 2221) Temp Source: Oral (02/07 2221) BP: 116/100 (02/07 2221) Pulse Rate: 75 (02/07 2221)  Labs: Recent Labs    02/20/17 2227  HGB 12.8*  HCT 38.7*  PLT 260  CREATININE 0.80    Estimated Creatinine Clearance: 87.8 mL/min (by C-G formula based on SCr of 0.8 mg/dL).   Medical History: Past Medical History:  Diagnosis Date  . Cognitive impairment 2007   after stroke, saw rehab but told to stop because was too upsetting to him  . History of chicken pox   . HLD (hyperlipidemia)   . HTN (hypertension)   . Obesity   . Stroke, hemorrhagic (HCC) 2007   thought 2/2 HTN (240sbp); residual cognitive impairment, loss of R peripheral field, no driving    Assessment: [id number] male c/o CP w/ inspiration, initial istat troponin elevated, to begin heparin.  Goal of Therapy:  Heparin level 0.3-0.7 units/ml Monitor platelets by anticoagulation protocol: Yes   Plan:  Will give heparin 4000 units IV bolus x1 followed by gtt at 1000 units/hr and monitor heparin levels and CBC.  Vernard GamblesVeronda Mang [place]elrigg, PharmD, BCPS  02/21/2017,12:07 AM

## 2017-02-21 NOTE — Interval H&P Note (Signed)
History and Physical Interval Note:  02/21/2017 9:20 AM  [name] Cruz  has presented today for cardiac catheterization, with the diagnosis of NSTEMI. The various methods of treatment have been discussed with the patient and family. After consideration of risks, benefits and other options for treatment, the patient has consented to  Procedure(s): LEFT HEART CATH AND CORONARY ANGIOGRAPHY (N/A) as a surgical intervention .  The patient's history has been reviewed, patient examined, no change in status, stable for surgery.  I have reviewed the patient's chart and labs.  Questions were answered to the patient's satisfaction.    Cath Lab Visit (complete for each Cath Lab visit)  Clinical Evaluation Leading to the Procedure:   ACS: Yes.    Non-ACS:  N/A  [name]

## 2017-02-21 NOTE — Progress Notes (Signed)
Patient ID: [name] Cruz, male   DOB: [birth year], 62 y.o.   MRN: [id number]      [address]       [place] [address]             [phone]        [facility] Medical Record #[id number] Date of Birth: 1955/03/16  Referring: No ref. provider found Primary Care: [name], MD Primary Cardiologist:No primary care provider on file.  Chief Complaint:    Chief Complaint  Patient presents with  . Shaking  . "doesn't feel well"  . Chest Pain    History of Present Illness:     Patient came to the emergency room last night approximately 8 PM feeling poorly and with a rapid heart rate.  Stayed in the emergency room 12 hours as  troponin rose from 0.68-19.7 by this morning at 5 AM,  Underwent cardiac catheterization this morning and placement of the balloon pump   Current Activity/ Functional Status: Patient is independent with mobility/ambulation, transfers, ADL's, IADL's.   Zubrod Score: At the time of surgery this patient's most appropriate activity status/level should be described as: []     0    Normal activity, no symptoms [x]     1    Restricted in physical strenuous activity but ambulatory, able to do out light work []     2    Ambulatory and capable of self care, unable to do work activities, up and about                 more than 50%  Of the time                            []     3    Only limited self care, in bed greater than 50% of waking hours []     4    Completely disabled, no self care, confined to bed or chair []     5    Moribund  Past Medical History:  Diagnosis Date  . Cognitive impairment 2007   after stroke, saw rehab but told to stop because was too upsetting to him  . History of chicken pox   . HLD (hyperlipidemia)   . HTN (hypertension)   . Obesity   . Stroke, hemorrhagic (HCC) 2007   thought 2/2 HTN (240sbp); residual cognitive impairment, loss of R peripheral field, no driving    Past Surgical History:    Procedure Laterality Date  . ANKLE SURGERY  1990s   right foot with plate and screws  . IABP INSERTION N/A 02/21/2017   Procedure: IABP Insertion;  Surgeon: Yvonne Kendall, MD;  Location: [facility] INVASIVE CV LAB;  Service: Cardiovascular;  Laterality: N/A;  . LEFT HEART CATH AND CORONARY ANGIOGRAPHY N/A 02/21/2017   Procedure: LEFT HEART CATH AND CORONARY ANGIOGRAPHY;  Surgeon: Yvonne Kendall, MD;  Location: [facility] INVASIVE CV LAB;  Service: Cardiovascular;  Laterality: N/A;    Social History   Tobacco Use  Smoking Status Never Smoker  Smokeless Tobacco Former Neurosurgeon    Social History   Substance and Sexual Activity  Alcohol Use Yes  . Alcohol/week: 8.4 oz  . Types: 12 Cans of beer, 2 Shots of liquor per week   Comment: regular alcohol use    Social History   Socioeconomic History  . Marital status: Single    Spouse name: Not on file  . Number of  children: Not on file  . Years of education: Not on file  . Highest education level: Not on file  Social Needs  . Financial resource strain: Not on file  . Food insecurity - worry: Not on file  . Food insecurity - inability: Not on file  . Transportation needs - medical: Not on file  . Transportation needs - non-medical: Not on file  Occupational History  . Not on file  Tobacco Use  . Smoking status: Never Smoker  . Smokeless tobacco: Former Engineer, water and Sexual Activity  . Alcohol use: Yes    Alcohol/week: 8.4 oz    Types: 12 Cans of beer, 2 Shots of liquor per week    Comment: regular alcohol use  . Drug use: No  . Sexual activity: No  Other Topics Concern  . Not on file  Social History Narrative   Caffeine: 1 pot coffee   Lives with oldest sister in [place]   Occupation: disabled, was Corporate investment banker   Does not drive.   Activity: no regular exercise   Diet: good water, drinks V8 juice    No Known Allergies  Current Facility-Administered Medications  Medication Dose Route Frequency Provider Last Rate Last  Dose  . 0.9 %  sodium chloride infusion  250 mL Intravenous PRN End, Cristal Deer, MD      . 0.9 %  sodium chloride infusion  250 mL Intravenous PRN End, Cristal Deer, MD      . 0.9% sodium chloride infusion  1 mL/kg/hr Intravenous Continuous End, Christopher, MD 78.2 mL/hr at 02/21/17 2000 1 mL/kg/hr at 02/21/17 2000  . acetaminophen (TYLENOL) tablet 650 mg  650 mg Oral Q4H PRN Chakravartti, Jaidip, MD      . amiodarone (NEXTERONE PREMIX) 360-4.14 MG/200ML-% (1.8 mg/mL) IV infusion  30 mg/hr Intravenous Continuous Clegg, Amy D, NP 16.7 mL/hr at 02/21/17 2000 30 mg/hr at 02/21/17 2000  . [START ON 02/22/2017] aspirin EC tablet 81 mg  81 mg Oral Daily Chakravartti, Jaidip, MD      . atorvastatin (LIPITOR) tablet 80 mg  80 mg Oral q1800 Chakravartti, Jaidip, MD   80 mg at 02/21/17 1736  . heparin ADULT infusion 100 units/mL (25000 units/244mL sodium chloride 0.45%)  500 Units/hr Intravenous Continuous Corky Crafts, MD 5 mL/hr at 02/21/17 2000 500 Units/hr at 02/21/17 2000  . nitroGLYCERIN (NITROSTAT) SL tablet 0.4 mg  0.4 mg Sublingual Q5 Min x 3 PRN Roxy Horseman, PA-C      . ondansetron [facility]) injection 4 mg  4 mg Intravenous Q6H PRN Chakravartti, Jaidip, MD      . phenytoin (DILANTIN) ER capsule 100 mg  100 mg Oral TID Chakravartti, Jaidip, MD   100 mg at 02/21/17 1736  . sodium chloride flush (NS) 0.9 % injection 3 mL  3 mL Intravenous Q12H End, Christopher, MD      . sodium chloride flush (NS) 0.9 % injection 3 mL  3 mL Intravenous PRN End, Cristal Deer, MD      . sodium chloride flush (NS) 0.9 % injection 3 mL  3 mL Intravenous Q12H End, Christopher, MD      . sodium chloride flush (NS) 0.9 % injection 3 mL  3 mL Intravenous PRN End, Christopher, MD        Medications Prior to Admission  Medication Sig Dispense Refill Last Dose  . aspirin EC 81 MG tablet Take 81 mg by mouth daily.   02/20/2017 at Unknown time  . atorvastatin (LIPITOR) 80 MG tablet TAKE  1 TABLET (80 MG TOTAL) BY MOUTH  DAILY. FOR CHOLESTEROL 90 tablet 3 02/20/2017 at Unknown time  . hydrochlorothiazide (HYDRODIURIL) 25 MG tablet Take 1 tablet (25 mg total) by mouth daily. 90 tablet 1 02/20/2017 at Unknown time  . phenytoin (DILANTIN) 100 MG ER capsule TAKE ONE CAPSULE BY MOUTH 3 TIMES A DAY 270 capsule 3 02/20/2017 at Unknown time    Family History  Problem Relation Age of Onset  . Alzheimer's disease Maternal Grandfather   . Cancer Mother        lymphoma  . Alcohol abuse Father        smoker  . Coronary artery disease Neg Hx   . Stroke Neg Hx   . Diabetes Neg Hx      Review of Systems:   Review of Systems  Constitutional: Negative.   HENT: Negative.   Eyes: Negative.   Cardiovascular: Positive for chest pain and palpitations. Negative for leg swelling and PND.  Gastrointestinal: Negative.   Genitourinary: Negative.   Musculoskeletal: Negative.   Skin: Negative.   Neurological: Negative.   Endo/Heme/Allergies: Negative.   Psychiatric/Behavioral: Negative.    Pertinent items are noted in HPI.        Physical Exam: BP (!) 108/56   Pulse (!) 114   Temp 98.4 F (36.9 C) (Oral)   Resp 18   Ht 5\' 2"  (1.575 m)   Wt 172 lb 6.4 oz (78.2 kg)   SpO2 100%   BMI 31.53 kg/m    General appearance: alert, cooperative, appears older than stated age and no distress Head: Normocephalic, without obvious abnormality, atraumatic Neck: no adenopathy, no carotid bruit, no JVD, supple, symmetrical, trachea midline and thyroid not enlarged, symmetric, no tenderness/mass/nodules Lymph nodes: Cervical, supraclavicular, and axillary nodes normal. Resp: clear to auscultation bilaterally Back: symmetric, no curvature. ROM normal. No CVA tenderness. Cardio: regular rate and rhythm, S1, S2 normal, no murmur, click, rub or gallop GI: soft, non-tender; bowel sounds normal; no masses,  no organomegaly Extremities: extremities normal, atraumatic, no cyanosis or edema and Homans sign is negative, no sign of  DVT Neurologic: Grossly normal but with some left-sided weakness Patient has difficulty with speech, but with time is able to communicate relatively well, hesitates on words Intra-aortic balloon pump and right groin without hematoma palpable distal pulses bilaterally  Diagnostic Studies & Laboratory data:     Recent Radiology Findings:   Dg Chest Port 1 View  Result Date: 02/21/2017 CLINICAL DATA:  Status post intra-aortic balloon pump placement. EXAM: PORTABLE CHEST 1 VIEW COMPARISON:  02/21/2017 at 00:15 a.m. FINDINGS: Metallic tip of the intra-aortic balloon pump projects over the aortic knob. Lungs are essentially clear.  No other change from the prior study. IMPRESSION: 1. Metallic tip of the intra-aortic balloon pump projects over the aortic knob. 2. No acute cardiopulmonary disease. Electronically Signed   By: Amie Portland M.D.   On: 02/21/2017 12:16   Dg Chest Port 1 View  Result Date: 02/21/2017 CLINICAL DATA:  Initial evaluation for acute shortness of breath, chest pain. EXAM: PORTABLE CHEST 1 VIEW COMPARISON:  Prior radiograph from 02/11/2004. FINDINGS: Mild cardiomegaly. Mediastinal silhouette within normal limits. Tortuosity the intrathoracic aorta noted. Lungs normally inflated. Mild left basilar atelectasis and/or scarring. No focal infiltrates. No pulmonary edema or pleural effusion. No pneumothorax. No acute osseous abnormality. Degenerative changes noted about the shoulders bilaterally. IMPRESSION: 1. Mild left basilar atelectasis and/or scarring. No other active cardiopulmonary disease. 2. Mild cardiomegaly. Electronically Signed   By: Sharlet Salina  Phill Myron M.D.   On: 02/21/2017 00:42     I have independently reviewed the above radiologic studies.  Recent Lab Findings: Lab Results  Component Value Date   WBC 9.6 02/20/2017   HGB 12.8 (L) 02/20/2017   HCT 38.7 (L) 02/20/2017   PLT 260 02/20/2017   GLUCOSE 121 (H) 02/20/2017   CHOL 179 01/28/2017   TRIG 118.0 01/28/2017    HDL 62.50 01/28/2017   LDLDIRECT 122.0 07/22/2016   LDLCALC 92 01/28/2017   ALT 19 01/28/2017   AST 21 01/28/2017   NA 133 (L) 02/20/2017   K 3.5 02/20/2017   CL 93 (L) 02/20/2017   CREATININE 0.80 02/20/2017   BUN 6 02/20/2017   CO2 26 02/20/2017   TSH 0.75 10/17/2014   INR 0.98 02/21/2017   CATH: Procedures   IABP Insertion  LEFT HEART CATH AND CORONARY ANGIOGRAPHY  Conclusion   Conclusions: 1. Severe 3-vessel coronary artery disease, including 60% LMCA stenosis, chronic total occlusion of ostial LAD, 70% proximal LCx lesion, and sequential 70-99% proximal, mid, and distal RCA lesions. Mid and distal LAD fill via right-to-left collaterals. 2. Severely reduced left ventricular contraction (LVEF 25-30%). 3. Mildly elevated left ventricular filling pressure (LVEDP 20 mmHg). 4. Successful placement of 40 mL intraaortic balloon pump via the right common femoral artery.  Recommendations: 1. Cardiac surgery consultation for CABG. 2. Transfer to ICU pending surgical evaluation. 3. Begin heparin infusion 2 hours after right radial sheath removal. 4. Portable chest x-ray to verify IABP placement when the patient reaches ICU. 5. Aggressive secondary prevention.   Transthoracic Echocardiography  Patient:    Tyson, Parkison MR #:       [id number] Study Date: 02/21/2017 Gender:     M Age:        35 Height:     157.5 cm Weight:     78.2 kg BSA:        1.88 m^2 Pt. Status: Room:       [facility]   ATTENDING    Gilda Crease  SONOGRAPHER  Delcie Roch, RDCS, CCT  Forbes Cellar, Amy D  ADMITTING    Lance Muss S  PERFORMING   [facility], Inpatient  cc:  ------------------------------------------------------------------- LV EF: 45% -   50%  ------------------------------------------------------------------- Indications:      CHF - 428.0.  ------------------------------------------------------------------- History:   PMH:   Atrial fibrillation.   Coronary artery disease. Risk factors:  History of stroke. Balloon pump in place.  ------------------------------------------------------------------- Study Conclusions  - Left ventricle: The cavity size was normal. Wall thickness was   increased in a pattern of mild LVH. Systolic function was mildly   reduced. The estimated ejection fraction was in the range of 45%   to 50%. Diffuse hypokinesis. There is hypokinesis of the   anteroseptal myocardium. There was no evidence of elevated   ventricular filling pressure by Doppler parameters. - Mitral valve: There was mild regurgitation.  ------------------------------------------------------------------- Study data:  Comparison was made to the study of 04/06/2014.  Study status:  Routine.  Procedure:  The patient reported no pain pre or post test. Transthoracic echocardiography. Image quality was adequate.  Study completion:  There were no complications. Transthoracic echocardiography.  M-mode, complete 2D, spectral Doppler, and color Doppler.  Birthdate:  Patient birthdate: 01/28/1955.  Age:  Patient is 62 yr old.  Sex:  Gender: male. BMI: 31.5 kg/m^2.  Blood pressure:     113/74  Patient status: Inpatient.  Study date:  Study date:  02/21/2017. Study time: 01:12 PM.  Location:  ICU/CCU  -------------------------------------------------------------------  ------------------------------------------------------------------- Left ventricle:  The cavity size was normal. Wall thickness was increased in a pattern of mild LVH. Systolic function was mildly reduced. The estimated ejection fraction was in the range of 45% to 50%. Diffuse hypokinesis.  Regional wall motion abnormalities: There is hypokinesis of the anteroseptal myocardium. There was no evidence of elevated ventricular filling pressure by Doppler parameters.  ------------------------------------------------------------------- Aortic valve:   Trileaflet; normal thickness  leaflets. Mobility was not restricted.  Doppler:  Transvalvular velocity was within the normal range. There was no stenosis. There was no regurgitation.   ------------------------------------------------------------------- Aorta:  Aortic root: The aortic root was normal in size.  ------------------------------------------------------------------- Mitral valve:   Structurally normal valve.   Mobility was not restricted.  Doppler:  Transvalvular velocity was within the normal range. There was no evidence for stenosis. There was mild regurgitation.  ------------------------------------------------------------------- Left atrium:  The atrium was normal in size.  ------------------------------------------------------------------- Right ventricle:  The cavity size was normal. Wall thickness was normal. Systolic function was normal.  ------------------------------------------------------------------- Pulmonic valve:    Structurally normal valve.   Cusp separation was normal.  Doppler:  Transvalvular velocity was within the normal range. There was no evidence for stenosis. There was trivial regurgitation.  ------------------------------------------------------------------- Tricuspid valve:   Structurally normal valve.    Doppler: Transvalvular velocity was within the normal range. There was no regurgitation.  ------------------------------------------------------------------- Pulmonary artery:   The main pulmonary artery was normal-sized. Systolic pressure was within the normal range.  ------------------------------------------------------------------- Right atrium:  The atrium was normal in size.  ------------------------------------------------------------------- Pericardium:  There was no pericardial effusion.  ------------------------------------------------------------------- Systemic veins: Inferior vena cava: The vessel was normal in  size.  ------------------------------------------------------------------- Measurements   Left ventricle                         Value        Reference  LV ID, ED, PLAX chordal                49.4  mm     43 - 52  LV ID, ES, PLAX chordal        (H)     40    mm     23 - 38  LV fx shortening, PLAX chordal (L)     19    %      >=29  LV PW thickness, ED                    12.1  mm     ----------  IVS/LV PW ratio, ED                    0.91         <=1.3    Ventricular septum                     Value        Reference  IVS thickness, ED                      11    mm     ----------    LVOT                                   Value  Reference  LVOT ID, S                             22    mm     ----------  LVOT area                              3.8   cm^2   ----------    Aorta                                  Value        Reference  Aortic root ID, ED                     33    mm     ----------    Left atrium                            Value        Reference  LA ID, A-P, ES                         36    mm     ----------  LA ID/bsa, A-P                         1.91  cm/m^2 <=2.2  LA volume, S                           61.5  ml     ----------  LA volume/bsa, S                       32.7  ml/m^2 ----------  LA volume, ES, 1-p A4C                 62    ml     ----------  LA volume/bsa, ES, 1-p A4C             33    ml/m^2 ----------  LA volume, ES, 1-p A2C                 57.4  ml     ----------  LA volume/bsa, ES, 1-p A2C             30.5  ml/m^2 ----------    Right atrium                           Value        Reference  RA ID, S-I, ES, A4C                    45    mm     34 - 49  RA area, ES, A4C                       11.2  cm^2   8.3 - 19.5  RA volume, ES, A/L                     23    ml     ----------  RA  volume/bsa, ES, A/L                 12.2  ml/m^2 ----------    Systemic veins                         Value        Reference  Estimated CVP                          3      mm Hg  ----------    Right ventricle                        Value        Reference  RV s&', lateral, S                      12.1  cm/s   ----------  Legend: (L)  and  (H)  mark values outside specified reference range.  ------------------------------------------------------------------- Prepared and Electronically Authenticated by  Donato Schultz, M.D. 2019-02-08T14:13:00   Assessment / Plan:   Patient more than 12 hours into myocardial infarction, now stable pain-free in the intensive care unit. Patient has been seen and films reviewed We will tentatively plan to proceed with coronary artery bypass grafting on Monday  Delight Ovens MD      [address] [place] [place] [address] Office [phone]   Beeper [phone]  02/21/2017 8:21 PM

## 2017-02-22 ENCOUNTER — Inpatient Hospital Stay (HOSPITAL_COMMUNITY): Payer: Medicare Other

## 2017-02-22 LAB — CBC WITH DIFFERENTIAL/PLATELET
BASOS ABS: 0 10*3/uL (ref 0.0–0.1)
BASOS PCT: 0 %
EOS ABS: 0.1 10*3/uL (ref 0.0–0.7)
Eosinophils Relative: 1 %
HEMATOCRIT: 35.3 % — AB (ref 39.0–52.0)
HEMOGLOBIN: 11.3 g/dL — AB (ref 13.0–17.0)
Lymphocytes Relative: 18 %
Lymphs Abs: 1.7 10*3/uL (ref 0.7–4.0)
MCH: 31 pg (ref 26.0–34.0)
MCHC: 32 g/dL (ref 30.0–36.0)
MCV: 97 fL (ref 78.0–100.0)
Monocytes Absolute: 0.7 10*3/uL (ref 0.1–1.0)
Monocytes Relative: 8 %
NEUTROS ABS: 6.8 10*3/uL (ref 1.7–7.7)
NEUTROS PCT: 73 %
Platelets: 230 10*3/uL (ref 150–400)
RBC: 3.64 MIL/uL — ABNORMAL LOW (ref 4.22–5.81)
RDW: 13.7 % (ref 11.5–15.5)
WBC: 9.5 10*3/uL (ref 4.0–10.5)

## 2017-02-22 LAB — HEPARIN LEVEL (UNFRACTIONATED)
HEPARIN UNFRACTIONATED: 0.22 [IU]/mL — AB (ref 0.30–0.70)
Heparin Unfractionated: <0.1 IU/mL — ABNORMAL LOW (ref 0.30–0.70)
Heparin Unfractionated: <0.1 IU/mL — ABNORMAL LOW (ref 0.30–0.70)

## 2017-02-22 LAB — CBC
HCT: 36 % — ABNORMAL LOW (ref 39.0–52.0)
Hemoglobin: 11.4 g/dL — ABNORMAL LOW (ref 13.0–17.0)
MCH: 30.8 pg (ref 26.0–34.0)
MCHC: 31.7 g/dL (ref 30.0–36.0)
MCV: 97.3 fL (ref 78.0–100.0)
PLATELETS: 234 10*3/uL (ref 150–400)
RBC: 3.7 MIL/uL — AB (ref 4.22–5.81)
RDW: 13.9 % (ref 11.5–15.5)
WBC: 10.2 10*3/uL (ref 4.0–10.5)

## 2017-02-22 LAB — BASIC METABOLIC PANEL
Anion gap: 12 (ref 5–15)
BUN: <5 mg/dL — ABNORMAL LOW (ref 6–20)
CALCIUM: 8.3 mg/dL — AB (ref 8.9–10.3)
CO2: 20 mmol/L — ABNORMAL LOW (ref 22–32)
Chloride: 103 mmol/L (ref 101–111)
Creatinine, Ser: 0.69 mg/dL (ref 0.61–1.24)
Glucose, Bld: 147 mg/dL — ABNORMAL HIGH (ref 65–99)
Potassium: 3.7 mmol/L (ref 3.5–5.1)
SODIUM: 135 mmol/L (ref 135–145)

## 2017-02-22 LAB — GLUCOSE, CAPILLARY: GLUCOSE-CAPILLARY: 88 mg/dL (ref 65–99)

## 2017-02-22 LAB — TROPONIN I: Troponin I: 6.61 ng/mL (ref <0.03)

## 2017-02-22 MED ORDER — SODIUM CHLORIDE 0.9 % IV SOLN
30.0000 ug/min | INTRAVENOUS | Status: DC
  Filled 2017-02-22 (×2): qty 2

## 2017-02-22 MED ORDER — MAGNESIUM SULFATE 50 % IJ SOLN
40.0000 meq | INTRAMUSCULAR | Status: DC
  Filled 2017-02-22 (×2): qty 9.85

## 2017-02-22 MED ORDER — SODIUM CHLORIDE 0.9 % IV SOLN
INTRAVENOUS | Status: AC
  Administered 2017-02-24: .9 [IU]/h via INTRAVENOUS
  Filled 2017-02-22 (×2): qty 1

## 2017-02-22 MED ORDER — SODIUM CHLORIDE 0.9 % IV SOLN
1.5000 mg/kg/h | INTRAVENOUS | Status: AC
  Administered 2017-02-24: 1.5 mg/kg/h via INTRAVENOUS
  Filled 2017-02-22 (×2): qty 25

## 2017-02-22 MED ORDER — TRANEXAMIC ACID (OHS) BOLUS VIA INFUSION
15.0000 mg/kg | INTRAVENOUS | Status: AC
  Administered 2017-02-24: 1173 mg via INTRAVENOUS
  Filled 2017-02-22: qty 1173

## 2017-02-22 MED ORDER — POTASSIUM CHLORIDE CRYS ER 20 MEQ PO TBCR: 40.0000 meq | EXTENDED_RELEASE_TABLET | Freq: Once | ORAL | Status: DC

## 2017-02-22 MED ORDER — DEXTROSE 5 % IV SOLN
750.0000 mg | INTRAVENOUS | Status: DC
  Filled 2017-02-22 (×2): qty 750

## 2017-02-22 MED ORDER — PLASMA-LYTE 148 IV SOLN
INTRAVENOUS | Status: AC
  Administered 2017-02-24: 500 mL
  Filled 2017-02-22 (×2): qty 2.5

## 2017-02-22 MED ORDER — DOPAMINE-DEXTROSE 3.2-5 MG/ML-% IV SOLN
0.0000 ug/kg/min | INTRAVENOUS | Status: AC
  Administered 2017-02-24: 3 ug/kg/min via INTRAVENOUS
  Filled 2017-02-22 (×2): qty 250

## 2017-02-22 MED ORDER — HEPARIN SODIUM (PORCINE) 1000 UNIT/ML IJ SOLN
INTRAMUSCULAR | Status: DC
  Filled 2017-02-22 (×2): qty 30

## 2017-02-22 MED ORDER — TRANEXAMIC ACID (OHS) PUMP PRIME SOLUTION
2.0000 mg/kg | INTRAVENOUS | Status: DC
  Filled 2017-02-22 (×2): qty 1.56

## 2017-02-22 MED ORDER — POTASSIUM CHLORIDE CRYS ER 20 MEQ PO TBCR
40.0000 meq | EXTENDED_RELEASE_TABLET | Freq: Once | ORAL | Status: AC
  Administered 2017-02-22: 40 meq via ORAL
  Filled 2017-02-22: qty 2

## 2017-02-22 MED ORDER — DEXMEDETOMIDINE HCL IN NACL 400 MCG/100ML IV SOLN
0.1000 ug/kg/h | INTRAVENOUS | Status: AC
  Administered 2017-02-24: .2 ug/kg/h via INTRAVENOUS
  Filled 2017-02-22 (×2): qty 100

## 2017-02-22 MED ORDER — NITROGLYCERIN IN D5W 200-5 MCG/ML-% IV SOLN
2.0000 ug/min | INTRAVENOUS | Status: AC
  Administered 2017-02-24: 16.67 ug/min via INTRAVENOUS
  Filled 2017-02-22 (×2): qty 250

## 2017-02-22 MED ORDER — EPINEPHRINE PF 1 MG/ML IJ SOLN
0.0000 ug/min | INTRAVENOUS | Status: DC
  Filled 2017-02-22 (×2): qty 4

## 2017-02-22 MED ORDER — POTASSIUM CHLORIDE 2 MEQ/ML IV SOLN
80.0000 meq | INTRAVENOUS | Status: DC
  Filled 2017-02-22 (×2): qty 40

## 2017-02-22 MED ORDER — MILRINONE LACTATE IN DEXTROSE 20-5 MG/100ML-% IV SOLN
0.1250 ug/kg/min | INTRAVENOUS | Status: DC
  Filled 2017-02-22 (×2): qty 100

## 2017-02-22 MED ORDER — FUROSEMIDE 10 MG/ML IJ SOLN
40.0000 mg | Freq: Once | INTRAMUSCULAR | Status: AC
  Administered 2017-02-22: 40 mg via INTRAVENOUS
  Filled 2017-02-22: qty 4

## 2017-02-22 NOTE — Progress Notes (Signed)
ANTICOAGULATION CONSULT NOTE - Follow Up Consult  Pharmacy Consult for heparin Indication: IABP and Afib  Labs: Recent Labs    02/20/17 2227  02/21/17 0500 02/21/17 0741 02/21/17 1640 02/22/17 0207 02/22/17 0938  HGB 12.8*  --   --   --   --  11.4* 11.3*  HCT 38.7*  --   --   --   --  36.0* 35.3*  PLT 260  --   --   --   --  234 230  LABPROT  --   --   --  12.9  --   --   --   INR  --   --   --  0.98  --   --   --   HEPARINUNFRC  --   --   --  0.36  --  <0.10* <0.10*  CREATININE 0.80  --   --   --   --   --  0.69  TROPONINI  --    < > 19.69*  --  25.46*  --  6.61*   < > = values in this interval not displayed.    Assessment: 62yo male undetectable on heparin after started at low rate post cath with IABP placement awaiting CABG plans. Plan now for CABG Monday, increase heparin drip to goal. CBC stable Heparin level < 0.1 less than goal on heparin drip rate 800 uts/hr   Goal of Therapy:  Heparin level 0.3-0.5 units/ml   Plan:  Will increase heparin drip 1150 uts/hr Check HL in 6hr Daily HL, CBC  Leota SauersLisa Deannah Rossi Pharm.D. CPP, BCPS Clinical Pharmacist [phone] 02/22/2017 12:41 PM

## 2017-02-22 NOTE — Progress Notes (Signed)
ANTICOAGULATION CONSULT NOTE - Follow Up Consult  Pharmacy Consult for heparin Indication: IABP and Afib  Labs: Recent Labs    02/20/17 2227 02/21/17 0049 02/21/17 0500 02/21/17 0741 02/21/17 1640 02/22/17 0207  HGB 12.8*  --   --   --   --  11.4*  HCT 38.7*  --   --   --   --  36.0*  PLT 260  --   --   --   --  234  LABPROT  --   --   --  12.9  --   --   INR  --   --   --  0.98  --   --   HEPARINUNFRC  --   --   --  0.36  --  <0.10*  CREATININE 0.80  --   --   --   --   --   TROPONINI  --  4.90* 19.69*  --  25.46*  --     Assessment: 61yo male undetectable on heparin after started at low rate post cath with IABP placement, awaiting CABG Monday.  Goal of Therapy:  Heparin level 0.3-0.5 units/ml   Plan:  Will inccrease heparin gtt by 4 units/kg/hr to 800 units/hr (was previously at goal at 1000 units/hr) and check level in 6 hours.    Vernard GamblesVeronda Micael Barb, PharmD, BCPS  02/22/2017,5:46 AM

## 2017-02-22 NOTE — Progress Notes (Signed)
ANTICOAGULATION CONSULT NOTE - Follow Up Consult  Pharmacy Consult for heparin Indication: IABP and Afib  Labs: Recent Labs    02/20/17 2227  02/21/17 0500  02/21/17 0741 02/21/17 1640 02/22/17 0207 02/22/17 0938 02/22/17 1904  HGB 12.8*  --   --   --   --   --  11.4* 11.3*  --   HCT 38.7*  --   --   --   --   --  36.0* 35.3*  --   PLT 260  --   --   --   --   --  234 230  --   LABPROT  --   --   --   --  12.9  --   --   --   --   INR  --   --   --   --  0.98  --   --   --   --   HEPARINUNFRC  --   --   --    < > 0.36  --  <0.10* <0.10* 0.22*  CREATININE 0.80  --   --   --   --   --   --  0.69  --   TROPONINI  --    < > 19.69*  --   --  25.46*  --  6.61*  --    < > = values in this interval not displayed.    Assessment: 62yo male on heparin after started at low rate post cath with IABP placement awaiting CABG on Monday. Heparin level of 0.22 is below desired range. No issues with infusion.   Goal of Therapy:  Heparin level 0.3-0.5 units/ml   Plan:  Increase heparin drip 1300 units/hr Heparin level in am   Pollyann SamplesAndy Yisrael Obryan, PharmD, BCPS 02/22/2017, 8:01 PM

## 2017-02-22 NOTE — Plan of Care (Signed)
  Education: Knowledge of General Education information will improve 02/22/2017 1725 - Progressing by Jacklynn GanongMilford, Byrl Latin M, RN **Patient given Cardiac Surgery Booklet    Clinical Measurements: Respiratory complications will improve 02/22/2017 1725 - Progressing by Jacklynn GanongMilford, Dellia Donnelly M, RN **Patient able to pull 2500cc on IS   Nutrition: Adequate nutrition will be maintained 02/22/2017 1725 - Progressing by Jacklynn GanongMilford, Sehar Sedano M, RN **Patient consistently eating >75-100% of meals   Pain Managment: General experience of comfort will improve 02/22/2017 1725 - Progressing by Jacklynn GanongMilford, Felcia Huebert M, RN **Patient denies pain   Skin Integrity: Risk for impaired skin integrity will decrease 02/22/2017 1725 - Progressing by Jacklynn GanongMilford, Angalena Cousineau M, RN **Patient able to help with turning and active ROM   Education: Understanding of CV disease, CV risk reduction, and recovery process will improve 02/22/2017 1725 - Progressing by Jacklynn GanongMilford, Maeva Dant M, RN **Patient given Cardiac Surgery Booklet & began education about CABG

## 2017-02-22 NOTE — Progress Notes (Signed)
Patient ID: SURAFEL HILLEARY, male   DOB: 07-04-1955, 62 y.o.   MRN: [id number]   Progress Note  Patient Name: BRENDAN GADSON Date of Encounter: 02/22/2017  Primary Cardiologist: No primary care provider on file.   Subjective   No chest pain this morning.  No dyspnea.    IABP in place at 1:1, waveform with normal augmentation.  SBP in 100s.   Inpatient Medications    Scheduled Meds: . aspirin EC  81 mg Oral Daily  . atorvastatin  80 mg Oral q1800  . phenytoin  100 mg Oral TID  . potassium chloride  40 mEq Oral Once  . sodium chloride flush  3 mL Intravenous Q12H  . sodium chloride flush  3 mL Intravenous Q12H   Continuous Infusions: . sodium chloride    . sodium chloride 250 mL (02/22/17 0700)  . sodium chloride Stopped (02/22/17 0500)  . amiodarone 30 mg/hr (02/22/17 0700)  . heparin 800 Units/hr (02/22/17 0700)   PRN Meds: sodium chloride, sodium chloride, acetaminophen, nitroGLYCERIN, ondansetron (ZOFRAN) IV, sodium chloride flush, sodium chloride flush   Vital Signs    Vitals:   02/22/17 0630 02/22/17 0700 02/22/17 0730 02/22/17 0800  BP: 105/64 109/66 (!) 109/59 (!) 117/54  Pulse: 75 72 72 75  Resp: 11 14 15 13   Temp: 98.6 F (37 C) 98.6 F (37 C) 98.4 F (36.9 C) 98.6 F (37 C)  TempSrc:    Bladder  SpO2: 100% 98% 99% 98%  Weight:      Height:        Intake/Output Summary (Last 24 hours) at 02/22/2017 0853 Last data filed at 02/22/2017 0800 Gross per 24 hour  Intake 2005.55 ml  Output 3300 ml  Net -1294.45 ml   Filed Weights   02/21/17 0000  Weight: 172 lb 6.4 oz (78.2 kg)    Telemetry    NSR - Personally Reviewed  Physical Exam   General: NAD Neck: JVP 9-10 cm, no thyromegaly or thyroid nodule.  Lungs: Clear to auscultation bilaterally with normal respiratory effort. CV: Nondisplaced PMI.  Heart regular S1/S2, no S3/S4, no murmur.  No peripheral edema.  Pedal pulses palpable.  Abdomen: Soft, nontender, no hepatosplenomegaly, no distention.    Skin: Intact without lesions or rashes.  Neurologic: Alert and oriented x 3.  Psych: Normal affect. Extremities: No clubbing or cyanosis. IABP in place right groin, stable.  HEENT: Normal.    Labs    Chemistry Recent Labs  Lab 02/20/17 2227  NA 133*  K 3.5  CL 93*  CO2 26  GLUCOSE 121*  BUN 6  CREATININE 0.80  CALCIUM 9.4  GFRNONAA >60  GFRAA >60  ANIONGAP 14     Hematology Recent Labs  Lab 02/20/17 2227 02/22/17 0207  WBC 9.6 10.2  RBC 4.11* 3.70*  HGB 12.8* 11.4*  HCT 38.7* 36.0*  MCV 94.2 97.3  MCH 31.1 30.8  MCHC 33.1 31.7  RDW 13.1 13.9  PLT 260 234    Cardiac Enzymes Recent Labs  Lab 02/21/17 0049 02/21/17 0500 02/21/17 1640  TROPONINI 4.90* 19.69* 25.46*    Recent Labs  Lab 02/20/17 2238  TROPIPOC 0.68*     BNPNo results for input(s): BNP, PROBNP in the last 168 hours.   DDimer No results for input(s): DDIMER in the last 168 hours.   Radiology    Dg Chest Port 1 View  Result Date: 02/22/2017 CLINICAL DATA:  Intra-aortic balloon pump assist EXAM: PORTABLE CHEST 1 VIEW COMPARISON:  Yesterday  FINDINGS: Aortic balloon pump marker overlaps the lower aspect of the aortic knob. Stable cardiopericardial enlargement. No edema or consolidation. Artifact from EKG leads across the chest. IMPRESSION: Intra-aortic balloon pump with tip over the lower aspect of the aortic knob. Stable cardiac enlargement.  No pulmonary edema. Electronically Signed   By: Marnee SpringJonathon  Watts M.D.   On: 02/22/2017 07:02   Dg Chest Port 1 View  Result Date: 02/21/2017 CLINICAL DATA:  Status post intra-aortic balloon pump placement. EXAM: PORTABLE CHEST 1 VIEW COMPARISON:  02/21/2017 at 00:15 a.m. FINDINGS: Metallic tip of the intra-aortic balloon pump projects over the aortic knob. Lungs are essentially clear.  No other change from the prior study. IMPRESSION: 1. Metallic tip of the intra-aortic balloon pump projects over the aortic knob. 2. No acute cardiopulmonary disease.  Electronically Signed   By: Amie Portlandavid  Ormond M.D.   On: 02/21/2017 12:16   Dg Chest Port 1 View  Result Date: 02/21/2017 CLINICAL DATA:  Initial evaluation for acute shortness of breath, chest pain. EXAM: PORTABLE CHEST 1 VIEW COMPARISON:  Prior radiograph from 02/11/2004. FINDINGS: Mild cardiomegaly. Mediastinal silhouette within normal limits. Tortuosity the intrathoracic aorta noted. Lungs normally inflated. Mild left basilar atelectasis and/or scarring. No focal infiltrates. No pulmonary edema or pleural effusion. No pneumothorax. No acute osseous abnormality. Degenerative changes noted about the shoulders bilaterally. IMPRESSION: 1. Mild left basilar atelectasis and/or scarring. No other active cardiopulmonary disease. 2. Mild cardiomegaly. Electronically Signed   By: Rise MuBenjamin  McClintock M.D.   On: 02/21/2017 00:42     Patient Profile     62 y.o. male with history of hemorrhagic CVA and memory impairment presented with NSTEMI + afib/RVR.   Assessment & Plan    1. CAD: NSTEMI.  LHC 2/8 with 60% left main, CTO ostial LAD, 70% pLCx, 70-90% stenoses in the proximal/mid/distal RCA.  IABP placed given ongoing angina.  No chest pain today.  - Continue ASA 81 and atorvastatin 80 daily.  - Plan for CABG Monday, will leave IABP in place until then as long as no complications (stable this morning).  - Will not start beta blocker or ACEI today with soft BP (SBP 100s).  - Heparin gtt for IABP and afib.  2. Acute on chronic primarily diastolic CHF: Echo with EF 45-50%, anteroseptal HK.  LVEDP 20 yesterday, some JVD on exam today.  - Will give 1 dose Lasix 40 mg IV with KCl.  3. Atrial fibrillation: Admitted with afib/RVR, now back in NSR.   - Continue amiodarone gtt to maintain NSR.  - Continue heparin gtt. Will need anticoagulation post-surgery.   For questions or updates, please contact [facility] Please consult [email or web address] for contact info under Cardiology/STEMI.    Signed, Marca Anconaalton Janayia Burggraf,  MD  02/22/2017, 8:53 AM

## 2017-02-23 ENCOUNTER — Other Ambulatory Visit: Payer: Self-pay

## 2017-02-23 LAB — COMPREHENSIVE METABOLIC PANEL
ALK PHOS: 53 U/L (ref 38–126)
ALT: 20 U/L (ref 17–63)
ALT: 21 U/L (ref 17–63)
ANION GAP: 11 (ref 5–15)
AST: 38 U/L (ref 15–41)
AST: 35 U/L (ref 15–41)
Albumin: 3.1 g/dL — ABNORMAL LOW (ref 3.5–5.0)
Albumin: 3.2 g/dL — ABNORMAL LOW (ref 3.5–5.0)
Alkaline Phosphatase: 55 U/L (ref 38–126)
Anion gap: 12 (ref 5–15)
BILIRUBIN TOTAL: 0.5 mg/dL (ref 0.3–1.2)
BUN: <5 mg/dL — ABNORMAL LOW (ref 6–20)
BUN: <5 mg/dL — ABNORMAL LOW (ref 6–20)
CALCIUM: 8.7 mg/dL — AB (ref 8.9–10.3)
CO2: 25 mmol/L (ref 22–32)
CO2: 26 mmol/L (ref 22–32)
Calcium: 8.9 mg/dL (ref 8.9–10.3)
Chloride: 102 mmol/L (ref 101–111)
Chloride: 102 mmol/L (ref 101–111)
Creatinine, Ser: 0.68 mg/dL (ref 0.61–1.24)
Creatinine, Ser: 0.74 mg/dL (ref 0.61–1.24)
GFR calc Af Amer: >60 mL/min (ref >60)
GFR calc non Af Amer: >60 mL/min (ref >60)
Glucose, Bld: 110 mg/dL — ABNORMAL HIGH (ref 65–99)
Glucose, Bld: 124 mg/dL — ABNORMAL HIGH (ref 65–99)
POTASSIUM: 3.6 mmol/L (ref 3.5–5.1)
Potassium: 3.8 mmol/L (ref 3.5–5.1)
Sodium: 138 mmol/L (ref 135–145)
Sodium: 140 mmol/L (ref 135–145)
TOTAL PROTEIN: 5.8 g/dL — AB (ref 6.5–8.1)
Total Bilirubin: 0.5 mg/dL (ref 0.3–1.2)
Total Protein: 6.1 g/dL — ABNORMAL LOW (ref 6.5–8.1)

## 2017-02-23 LAB — HEPARIN LEVEL (UNFRACTIONATED)
HEPARIN UNFRACTIONATED: 0.51 [IU]/mL (ref 0.30–0.70)
Heparin Unfractionated: 0.28 IU/mL — ABNORMAL LOW (ref 0.30–0.70)

## 2017-02-23 LAB — POCT I-STAT 3, ART BLOOD GAS (G3+)
BICARBONATE: 24.8 mmol/L (ref 20.0–28.0)
O2 Saturation: 98 %
PO2 ART: 103 mmHg (ref 83.0–108.0)
TCO2: 26 mmol/L (ref 22–32)
pCO2 arterial: 38.2 mmHg (ref 32.0–48.0)
pH, Arterial: 7.421 (ref 7.350–7.450)

## 2017-02-23 LAB — HEMOGLOBIN A1C
Hgb A1c MFr Bld: 5.5 % (ref 4.8–5.6)
Mean Plasma Glucose: 111.15 mg/dL

## 2017-02-23 LAB — PROTIME-INR
INR: 1.02
Prothrombin Time: 13.3 seconds (ref 11.4–15.2)

## 2017-02-23 LAB — CBC WITH DIFFERENTIAL/PLATELET
BASOS PCT: 0 %
Basophils Absolute: 0 10*3/uL (ref 0.0–0.1)
EOS ABS: 0.2 10*3/uL (ref 0.0–0.7)
EOS PCT: 2 %
HCT: 36 % — ABNORMAL LOW (ref 39.0–52.0)
HEMOGLOBIN: 11.6 g/dL — AB (ref 13.0–17.0)
LYMPHS ABS: 2.6 10*3/uL (ref 0.7–4.0)
Lymphocytes Relative: 25 %
MCH: 31 pg (ref 26.0–34.0)
MCHC: 32.2 g/dL (ref 30.0–36.0)
MCV: 96.3 fL (ref 78.0–100.0)
Monocytes Absolute: 1 10*3/uL (ref 0.1–1.0)
Monocytes Relative: 10 %
NEUTROS PCT: 63 %
Neutro Abs: 6.5 10*3/uL (ref 1.7–7.7)
PLATELETS: 200 10*3/uL (ref 150–400)
RBC: 3.74 MIL/uL — AB (ref 4.22–5.81)
RDW: 13.7 % (ref 11.5–15.5)
WBC: 10.3 10*3/uL (ref 4.0–10.5)

## 2017-02-23 LAB — MAGNESIUM: Magnesium: 1.7 mg/dL (ref 1.7–2.4)

## 2017-02-23 LAB — TSH: TSH: 2.054 u[IU]/mL (ref 0.350–4.500)

## 2017-02-23 LAB — APTT: aPTT: 86 seconds — ABNORMAL HIGH (ref 24–36)

## 2017-02-23 LAB — ABO/RH: ABO/RH(D): O POS

## 2017-02-23 MED ORDER — DEXTROSE 5 % IV SOLN
1.5000 g | INTRAVENOUS | Status: AC
  Administered 2017-02-24: 1.5 g via INTRAVENOUS
  Filled 2017-02-23: qty 1.5

## 2017-02-23 MED ORDER — MAGNESIUM SULFATE 2 GM/50ML IV SOLN
2.0000 g | Freq: Once | INTRAVENOUS | Status: AC
  Administered 2017-02-23: 2 g via INTRAVENOUS
  Filled 2017-02-23: qty 50

## 2017-02-23 MED ORDER — CARVEDILOL 3.125 MG PO TABS
3.1250 mg | ORAL_TABLET | Freq: Two times a day (BID) | ORAL | Status: DC
  Administered 2017-02-23 (×2): 3.125 mg via ORAL
  Filled 2017-02-23 (×2): qty 1

## 2017-02-23 MED ORDER — BISACODYL 5 MG PO TBEC: 5.0000 mg | DELAYED_RELEASE_TABLET | Freq: Once | ORAL | Status: DC

## 2017-02-23 MED ORDER — VANCOMYCIN HCL 10 G IV SOLR
1250.0000 mg | INTRAVENOUS | Status: AC
  Administered 2017-02-24: 1250 mg via INTRAVENOUS
  Filled 2017-02-23: qty 1250

## 2017-02-23 MED ORDER — CHLORHEXIDINE GLUCONATE 0.12 % MT SOLN
15.0000 mL | Freq: Once | OROMUCOSAL | Status: AC
  Administered 2017-02-24: 15 mL via OROMUCOSAL
  Filled 2017-02-23: qty 15

## 2017-02-23 MED ORDER — METOPROLOL TARTRATE 12.5 MG HALF TABLET
12.5000 mg | ORAL_TABLET | Freq: Once | ORAL | Status: AC
  Administered 2017-02-24: 12.5 mg via ORAL
  Filled 2017-02-23: qty 1

## 2017-02-23 MED ORDER — CHLORHEXIDINE GLUCONATE CLOTH 2 % EX PADS
6.0000 | MEDICATED_PAD | Freq: Once | CUTANEOUS | Status: AC
  Administered 2017-02-23: 6 via TOPICAL

## 2017-02-23 MED ORDER — POTASSIUM CHLORIDE CRYS ER 20 MEQ PO TBCR
40.0000 meq | EXTENDED_RELEASE_TABLET | Freq: Once | ORAL | Status: AC
  Administered 2017-02-23: 40 meq via ORAL
  Filled 2017-02-23: qty 2

## 2017-02-23 MED ORDER — CHLORHEXIDINE GLUCONATE CLOTH 2 % EX PADS
6.0000 | MEDICATED_PAD | Freq: Once | CUTANEOUS | Status: AC
  Administered 2017-02-24: 6 via TOPICAL

## 2017-02-23 MED ORDER — TEMAZEPAM 15 MG PO CAPS: 15.0000 mg | ORAL_CAPSULE | Freq: Once | ORAL | Status: DC | PRN

## 2017-02-23 NOTE — Progress Notes (Signed)
ANTICOAGULATION CONSULT NOTE - Follow Up Consult  Pharmacy Consult for heparin Indication: IABP and Afib  Labs: Recent Labs    02/20/17 2227  02/21/17 0500  02/21/17 0741 02/21/17 1640 02/22/17 0207 02/22/17 0938 02/22/17 1904 02/23/17 0221  HGB 12.8*  --   --   --   --   --  11.4* 11.3*  --  11.6*  HCT 38.7*  --   --   --   --   --  36.0* 35.3*  --  36.0*  PLT 260  --   --   --   --   --  234 230  --  200  LABPROT  --   --   --   --  12.9  --   --   --   --   --   INR  --   --   --   --  0.98  --   --   --   --   --   HEPARINUNFRC  --   --   --    < > 0.36  --  <0.10* <0.10* 0.22* 0.28*  CREATININE 0.80  --   --   --   --   --   --  0.69  --   --   TROPONINI  --    < > 19.69*  --   --  25.46*  --  6.61*  --   --    < > = values in this interval not displayed.    Assessment: 62yo male remains subtherapeutic on heparin after rate change though now close to goal.  Goal of Therapy:  Heparin level 0.3-0.5 units/ml   Plan:  Will inccrease heparin gtt slightly to 1400 units/hr and check level in 6 hours.    Vernard GamblesVeronda Kahlia Lagunes, PharmD, BCPS  02/23/2017,4:31 AM

## 2017-02-23 NOTE — Progress Notes (Signed)
Patient ID: [name] MileBilly J Kugelman, male   DOB: 1955-08-02, 62 y.o.   MRN: [id number] TCTS DAILY ICU PROGRESS NOTE                   [address]            [place] [id number]          [phone]   2 Days Post-Op Procedure(s) (LRB): LEFT HEART CATH AND CORONARY ANGIOGRAPHY (N/A) IABP Insertion (N/A)  Total Length of Stay:  LOS: 2 days   Subjective: Patient comfortable without chest pain or shortness of breath.   Objective: Vital signs in last 24 hours: Temp:  [98.6 F (37 C)-100.2 F (37.9 C)] 98.6 F (37 C) (02/10 0700) Pulse Rate:  [60-109] 73 (02/10 0700) Cardiac Rhythm: Normal sinus rhythm (02/10 0600) Resp:  [12-22] 12 (02/10 0700) BP: (88-128)/(47-89) 94/65 (02/10 0700) SpO2:  [82 %-100 %] 98 % (02/10 0700)  Filed Weights   02/21/17 0000  Weight: 172 lb 6.4 oz (78.2 kg)    Weight change:    Hemodynamic parameters for last 24 hours:    Intake/Output from previous day: 02/09 0701 - 02/10 0700 In: 2693.7 [P.O.:1780; I.V.:913.7] Out: 6550 [Urine:6550]  Intake/Output this shift: No intake/output data recorded.  Current Meds: Scheduled Meds: . aspirin EC  81 mg Oral Daily  . atorvastatin  80 mg Oral q1800  . carvedilol  3.125 mg Oral BID WC  . [START ON 02/24/2017] heparin-papaverine-plasmalyte irrigation   Irrigation To OR  . [START ON 02/24/2017] magnesium sulfate  40 mEq Other To OR  . phenytoin  100 mg Oral TID  . [START ON 02/24/2017] potassium chloride  80 mEq Other To OR  . potassium chloride  40 mEq Oral Once  . sodium chloride flush  3 mL Intravenous Q12H  . sodium chloride flush  3 mL Intravenous Q12H  . [START ON 02/24/2017] tranexamic acid  15 mg/kg Intravenous To OR  . [START ON 02/24/2017] tranexamic acid  2 mg/kg Intracatheter To OR   Continuous Infusions: . sodium chloride    . sodium chloride 250 mL (02/23/17 0700)  . sodium chloride Stopped (02/22/17 0500)  . amiodarone 30 mg/hr (02/23/17 0700)  . [START ON 02/24/2017] cefUROXime  (ZINACEF)  IV    . [START ON 02/24/2017] dexmedetomidine    . [START ON 02/24/2017] DOPamine    . [START ON 02/24/2017] epinephrine    . [START ON 02/24/2017] heparin 30,000 units/NS 1000 mL solution for CELLSAVER    . heparin 1,400 Units/hr (02/23/17 0700)  . [START ON 02/24/2017] insulin (NOVOLIN-R) infusion    . magnesium sulfate 1 - 4 g bolus IVPB    . [START ON 02/24/2017] milrinone    . [START ON 02/24/2017] nitroGLYCERIN    . [START ON 02/24/2017] phenylephrine 20mg /25250mL NS (0.08mg /ml) infusion    . [START ON 02/24/2017] tranexamic acid (CYKLOKAPRON) infusion ([facility])     PRN Meds:.sodium chloride, sodium chloride, acetaminophen, nitroGLYCERIN, ondansetron (ZOFRAN) IV, sodium chloride flush, sodium chloride flush  General appearance: alert and cooperative Neurologic: intact and Mild left-sided weakness, with some speech difficulty but easily understood Heart: regular rate and rhythm, S1, S2 normal, no murmur, click, rub or gallop Lungs: diminished breath sounds bibasilar Abdomen: soft, non-tender; bowel sounds normal; no masses,  no organomegaly Extremities: extremities normal, atraumatic, no cyanosis or edema and Homans sign is negative, no sign of DVT Wound: Intra-aortic balloon pump right femoral artery without hematoma Palpable pulses bilaterally in the lower  extremities, patient had previous right ankle surgery with scarring and pins placed  Lab Results: CBC: Recent Labs    02/22/17 0938 02/23/17 0221  WBC 9.5 10.3  HGB 11.3* 11.6*  HCT 35.3* 36.0*  PLT 230 200   BMET:  Recent Labs    02/22/17 0938 02/23/17 0221  NA 135 138  K 3.7 3.6  CL 103 102  CO2 20* 25  GLUCOSE 147* 110*  BUN <5* <5*  CREATININE 0.69 0.68  CALCIUM 8.3* 8.7*    CMET: Lab Results  Component Value Date   WBC 10.3 02/23/2017   HGB 11.6 (L) 02/23/2017   HCT 36.0 (L) 02/23/2017   PLT 200 02/23/2017   GLUCOSE 110 (H) 02/23/2017   CHOL 179 01/28/2017   TRIG 118.0 01/28/2017   HDL 62.50  01/28/2017   LDLDIRECT 122.0 07/22/2016   LDLCALC 92 01/28/2017   ALT 20 02/23/2017   AST 38 02/23/2017   NA 138 02/23/2017   K 3.6 02/23/2017   CL 102 02/23/2017   CREATININE 0.68 02/23/2017   BUN <5 (L) 02/23/2017   CO2 25 02/23/2017   TSH 2.054 02/23/2017   PSA 0.51 01/28/2017   INR 0.98 02/21/2017      PT/INR:  Recent Labs    02/21/17 0741  LABPROT 12.9  INR 0.98   Radiology: No results found.   Assessment/Plan: S/P Procedure(s) (LRB): LEFT HEART CATH AND CORONARY ANGIOGRAPHY (N/A) IABP Insertion (N/A) Stable to proceed with  coronary artery bypass grafting tomorrow I discussed with the patient in detail the risks and options of surgery he understands and has asked appropriate questions The goals risks and alternatives of the planned surgical procedure Procedure(s): LEFT HEART CATH AND CORONARY ANGIOGRAPHY (N/A) IABP Insertion (N/A)  have been discussed with the patient in detail. The risks of the procedure including death, infection, stroke, myocardial infarction, bleeding, blood transfusion have all been discussed specifically.  I have quoted [name] Cruz a 2 % of perioperative mortality and a complication rate as high as 20 %. The patient's questions have been answered.MYLAN SCHWARZ is willing  to proceed with the planned procedure.     Delight Ovens 02/23/2017 8:40 AM

## 2017-02-23 NOTE — Progress Notes (Signed)
Patient ID: [name] Cruz, [name] Cruz   DOB: [birth year], 62 y.o.   MRN: [id number] EVENING ROUNDS NOTE :     [address]       [place] [id number]             [phone]                 2 Days Post-Op Procedure(s) (LRB): LEFT HEART CATH AND CORONARY ANGIOGRAPHY (N/A) IABP Insertion (N/A)  Total Length of Stay:  LOS: 2 days  BP 111/88   Pulse 79   Temp 99.3 F (37.4 C)   Resp 19   Ht 5\' 2"  (1.575 m)   Wt 172 lb 6.4 oz (78.2 kg)   SpO2 100%   BMI 31.53 kg/m   .Intake/Output      02/10 0701 - 02/11 0700   P.O. 1200   I.V. (mL/kg) 488.4 (6.2)   IV Piggyback 50   Total Intake(mL/kg) 1738.4 (22.2)   Urine (mL/kg/hr) 2235 (2.3)   Total Output 2235   Net -496.6         . sodium chloride    . sodium chloride 250 mL (02/23/17 0700)  . sodium chloride Stopped (02/22/17 0500)  . amiodarone 30 mg/hr (02/23/17 1013)  . [START ON 02/24/2017] cefUROXime (ZINACEF)  IV    . [START ON 02/24/2017] cefUROXime (ZINACEF)  IV    . [START ON 02/24/2017] dexmedetomidine    . [START ON 02/24/2017] DOPamine    . [START ON 02/24/2017] epinephrine    . [START ON 02/24/2017] heparin 30,000 units/NS 1000 mL solution for CELLSAVER    . heparin 1,400 Units/hr (02/23/17 1008)  . [START ON 02/24/2017] insulin (NOVOLIN-R) infusion    . [START ON 02/24/2017] milrinone    . [START ON 02/24/2017] nitroGLYCERIN    . [START ON 02/24/2017] phenylephrine 20mg /27650mL NS (0.08mg /ml) infusion    . [START ON 02/24/2017] tranexamic acid (CYKLOKAPRON) infusion ([facility])    . [START ON 02/24/2017] vancomycin       Lab Results  Component Value Date   WBC 10.3 02/23/2017   HGB 11.6 (L) 02/23/2017   HCT 36.0 (L) 02/23/2017   PLT 200 02/23/2017   GLUCOSE 124 (H) 02/23/2017   CHOL 179 01/28/2017   TRIG 118.0 01/28/2017   HDL 62.50 01/28/2017   LDLDIRECT 122.0 07/22/2016   LDLCALC 92 01/28/2017   ALT 21 02/23/2017   AST 35 02/23/2017   NA 140 02/23/2017   K 3.8 02/23/2017   CL 102 02/23/2017   CREATININE  0.74 02/23/2017   BUN <5 (L) 02/23/2017   CO2 26 02/23/2017   TSH 2.054 02/23/2017   PSA 0.51 01/28/2017   INR 1.02 02/23/2017   HGBA1C 5.5 02/23/2017   Patient without chest pain Sister is present this evening and again discussed with her and the patient plan coronary artery bypass grafting tomorrow   Delight OvensEdward B Weda Baumgarner MD  Beeper [phone] Office [phone] 02/23/2017 7:37 PM

## 2017-02-23 NOTE — Progress Notes (Signed)
ANTICOAGULATION CONSULT NOTE - Follow Up Consult  Pharmacy Consult for heparin Indication: IABP and Afib  Labs: Recent Labs    02/21/17 0500  02/21/17 0741 02/21/17 1640 02/22/17 0207 02/22/17 0938 02/22/17 1904 02/23/17 0221 02/23/17 1025  HGB  --   --   --   --  11.4* 11.3*  --  11.6*  --   HCT  --   --   --   --  36.0* 35.3*  --  36.0*  --   PLT  --   --   --   --  234 230  --  200  --   APTT  --   --   --   --   --   --   --   --  86*  LABPROT  --   --  12.9  --   --   --   --   --  13.3  INR  --   --  0.98  --   --   --   --   --  1.02  HEPARINUNFRC  --    < > 0.36  --  <0.10* <0.10* 0.22* 0.28* 0.51  CREATININE  --   --   --   --   --  0.69  --  0.68 0.74  TROPONINI 19.69*  --   --  25.46*  --  6.61*  --   --   --    < > = values in this interval not displayed.    Assessment: 61yo male undetectable on heparin after started at low rate post cath with IABP placement awaiting CABG plans. Plan now for CABG Monday, increase heparin drip to goal. CBC stable Heparin level 0.5at goal on heparin drip rate 1400 uts/hr   Goal of Therapy:  Heparin level 0.3-0.5 units/ml   Plan:  Continue heparin drip 1400 uts/hr - turn off on call to OR Daily HL, CBC   Leota SauersLisa Janitza Revuelta Pharm.D. CPP, BCPS Clinical Pharmacist [phone] 02/23/2017 12:19 PM

## 2017-02-23 NOTE — Progress Notes (Signed)
Patient ID: [name] Cruz, male   DOB: 1955-12-07, 62 y.o.   MRN: [id number]   Progress Note  Patient Name: [name] Cruz Date of Encounter: 02/23/2017  Primary Cardiologist: No primary care provider on file.   Subjective   No chest pain or dyspnea.    Good UOP with Lasix yesterday.   IABP in place at 1:1, waveform with normal augmentation.  SBP in 100s-110s.   Inpatient Medications    Scheduled Meds: . aspirin EC  81 mg Oral Daily  . atorvastatin  80 mg Oral q1800  . carvedilol  3.125 mg Oral BID WC  . [START ON 02/24/2017] heparin-papaverine-plasmalyte irrigation   Irrigation To OR  . [START ON 02/24/2017] magnesium sulfate  40 mEq Other To OR  . phenytoin  100 mg Oral TID  . [START ON 02/24/2017] potassium chloride  80 mEq Other To OR  . sodium chloride flush  3 mL Intravenous Q12H  . sodium chloride flush  3 mL Intravenous Q12H  . [START ON 02/24/2017] tranexamic acid  15 mg/kg Intravenous To OR  . [START ON 02/24/2017] tranexamic acid  2 mg/kg Intracatheter To OR   Continuous Infusions: . sodium chloride    . sodium chloride 250 mL (02/23/17 0700)  . sodium chloride Stopped (02/22/17 0500)  . amiodarone 30 mg/hr (02/23/17 0700)  . [START ON 02/24/2017] cefUROXime (ZINACEF)  IV    . [START ON 02/24/2017] dexmedetomidine    . [START ON 02/24/2017] DOPamine    . [START ON 02/24/2017] epinephrine    . [START ON 02/24/2017] heparin 30,000 units/NS 1000 mL solution for CELLSAVER    . heparin 1,400 Units/hr (02/23/17 0700)  . [START ON 02/24/2017] insulin (NOVOLIN-R) infusion    . magnesium sulfate 1 - 4 g bolus IVPB    . [START ON 02/24/2017] milrinone    . [START ON 02/24/2017] nitroGLYCERIN    . [START ON 02/24/2017] phenylephrine 20mg /25050mL NS (0.08mg /ml) infusion    . [START ON 02/24/2017] tranexamic acid (CYKLOKAPRON) infusion ([facility])     PRN Meds: sodium chloride, sodium chloride, acetaminophen, nitroGLYCERIN, ondansetron (ZOFRAN) IV, sodium chloride flush, sodium chloride  flush   Vital Signs    Vitals:   02/23/17 0400 02/23/17 0500 02/23/17 0600 02/23/17 0700  BP: 112/61 (!) 111/54 97/65 94/65   Pulse: 79 73 76 73  Resp: 13 13 14 12   Temp: 99 F (37.2 C) 98.6 F (37 C) 98.6 F (37 C) 98.6 F (37 C)  TempSrc:      SpO2: 99% 98% 97% 98%  Weight:      Height:        Intake/Output Summary (Last 24 hours) at 02/23/2017 0818 Last data filed at 02/23/2017 0700 Gross per 24 hour  Intake 2658.96 ml  Output 6485 ml  Net -3826.04 ml   Filed Weights   02/21/17 0000  Weight: 172 lb 6.4 oz (78.2 kg)    Telemetry    NSR - Personally Reviewed  Physical Exam   General: NAD Neck: JVP 7-8 cm, no thyromegaly or thyroid nodule.  Lungs: Clear to auscultation bilaterally with normal respiratory effort. CV: Nondisplaced PMI.  Heart regular S1/S2, no S3/S4, no murmur.  No peripheral edema.  Palpable pedal pulses. Abdomen: Soft, nontender, no hepatosplenomegaly, no distention.  Skin: Intact without lesions or rashes.  Neurologic: Alert and oriented x 3.  Psych: Normal affect. Extremities: No clubbing or cyanosis. Right groin IABP site benign.  HEENT: Normal.    Labs    Chemistry Recent Labs  Lab 02/20/17 2227 02/22/17 0938 02/23/17 0221  NA 133* 135 138  K 3.5 3.7 3.6  CL 93* 103 102  CO2 26 20* 25  GLUCOSE 121* 147* 110*  BUN 6 <5* <5*  CREATININE 0.80 0.69 0.68  CALCIUM 9.4 8.3* 8.7*  PROT  --   --  5.8*  ALBUMIN  --   --  3.1*  AST  --   --  38  ALT  --   --  20  ALKPHOS  --   --  53  BILITOT  --   --  0.5  GFRNONAA >60 >60 >60  GFRAA >60 >60 >60  ANIONGAP 14 12 11      Hematology Recent Labs  Lab 02/22/17 0207 02/22/17 0938 02/23/17 0221  WBC 10.2 9.5 10.3  RBC 3.70* 3.64* 3.74*  HGB 11.4* 11.3* 11.6*  HCT 36.0* 35.3* 36.0*  MCV 97.3 97.0 96.3  MCH 30.8 31.0 31.0  MCHC 31.7 32.0 32.2  RDW 13.9 13.7 13.7  PLT 234 230 200    Cardiac Enzymes Recent Labs  Lab 02/21/17 0049 02/21/17 0500 02/21/17 1640 02/22/17 0938    TROPONINI 4.90* 19.69* 25.46* 6.61*    Recent Labs  Lab 02/20/17 2238  TROPIPOC 0.68*     BNPNo results for input(s): BNP, PROBNP in the last 168 hours.   DDimer No results for input(s): DDIMER in the last 168 hours.   Radiology    Dg Chest Port 1 View  Result Date: 02/22/2017 CLINICAL DATA:  Intra-aortic balloon pump assist EXAM: PORTABLE CHEST 1 VIEW COMPARISON:  Yesterday FINDINGS: Aortic balloon pump marker overlaps the lower aspect of the aortic knob. Stable cardiopericardial enlargement. No edema or consolidation. Artifact from EKG leads across the chest. IMPRESSION: Intra-aortic balloon pump with tip over the lower aspect of the aortic knob. Stable cardiac enlargement.  No pulmonary edema. Electronically Signed   By: Marnee Spring M.D.   On: 02/22/2017 07:02   Dg Chest Port 1 View  Result Date: 02/21/2017 CLINICAL DATA:  Status post intra-aortic balloon pump placement. EXAM: PORTABLE CHEST 1 VIEW COMPARISON:  02/21/2017 at 00:15 a.m. FINDINGS: Metallic tip of the intra-aortic balloon pump projects over the aortic knob. Lungs are essentially clear.  No other change from the prior study. IMPRESSION: 1. Metallic tip of the intra-aortic balloon pump projects over the aortic knob. 2. No acute cardiopulmonary disease. Electronically Signed   By: Amie Portland M.D.   On: 02/21/2017 12:16     Patient Profile     62 y.o. male with history of hemorrhagic CVA and memory impairment presented with NSTEMI + afib/RVR.   Assessment & Plan    1. CAD: NSTEMI.  LHC 2/8 with 60% left main, CTO ostial LAD, 70% pLCx, 70-90% stenoses in the proximal/mid/distal RCA.  IABP placed given ongoing angina.  No chest pain today.  - Continue ASA 81 and atorvastatin 80 daily.  - Plan for CABG Monday, will leave IABP in place until then as long as no complications (stable this morning).  - Add low dose Coreg 3.125 mg bid.  - Heparin gtt for IABP and afib.  2. Acute on chronic primarily diastolic CHF: Echo  with EF 45-50%, anteroseptal HK.  Good diuresis with Lasix yesterday, looks euvolemic today. - I do not think he needs Lasix today.  3. Atrial fibrillation: Admitted with afib/RVR, now back in NSR.   - Continue amiodarone gtt to maintain NSR.  - Continue heparin gtt. Will need anticoagulation post-surgery.   For  questions or updates, please contact [facility] Please consult [email or web address] for contact info under Cardiology/STEMI.    Signed, Marca Ancona, MD  02/23/2017, 8:18 AM

## 2017-02-24 ENCOUNTER — Inpatient Hospital Stay (HOSPITAL_COMMUNITY): Payer: Medicare Other

## 2017-02-24 ENCOUNTER — Encounter (HOSPITAL_COMMUNITY): Payer: Self-pay | Admitting: Anesthesiology

## 2017-02-24 ENCOUNTER — Inpatient Hospital Stay (HOSPITAL_COMMUNITY): Payer: Medicare Other | Admitting: Anesthesiology

## 2017-02-24 ENCOUNTER — Encounter (HOSPITAL_COMMUNITY)
Admission: EM | Disposition: A | Discharge status: Home / Self Care | Payer: Self-pay | Source: Home / Self Care | Attending: Cardiothoracic Surgery

## 2017-02-24 DIAGNOSIS — Z951 Presence of aortocoronary bypass graft: Secondary | ICD-10-CM

## 2017-02-24 HISTORY — PX: CORONARY ARTERY BYPASS GRAFT: SHX141

## 2017-02-24 HISTORY — PX: TEE WITHOUT CARDIOVERSION: SHX5443

## 2017-02-24 LAB — CBC WITH DIFFERENTIAL/PLATELET
BASOS ABS: 0 10*3/uL (ref 0.0–0.1)
BASOS PCT: 0 %
EOS PCT: 3 %
Eosinophils Absolute: 0.3 10*3/uL (ref 0.0–0.7)
HEMATOCRIT: 35.2 % — AB (ref 39.0–52.0)
Hemoglobin: 11.4 g/dL — ABNORMAL LOW (ref 13.0–17.0)
LYMPHS PCT: 23 %
Lymphs Abs: 2.3 10*3/uL (ref 0.7–4.0)
MCH: 31.1 pg (ref 26.0–34.0)
MCHC: 32.4 g/dL (ref 30.0–36.0)
MCV: 96.2 fL (ref 78.0–100.0)
MONO ABS: 1.2 10*3/uL — AB (ref 0.1–1.0)
Monocytes Relative: 12 %
NEUTROS ABS: 6.4 10*3/uL (ref 1.7–7.7)
Neutrophils Relative %: 62 %
PLATELETS: 217 10*3/uL (ref 150–400)
RBC: 3.66 MIL/uL — AB (ref 4.22–5.81)
RDW: 13.7 % (ref 11.5–15.5)
WBC: 10.3 10*3/uL (ref 4.0–10.5)

## 2017-02-24 LAB — POCT I-STAT, CHEM 8
BUN: 4 mg/dL — ABNORMAL LOW (ref 6–20)
BUN: 4 mg/dL — AB (ref 6–20)
BUN: 3 mg/dL — ABNORMAL LOW (ref 6–20)
BUN: 4 mg/dL — ABNORMAL LOW (ref 6–20)
BUN: 4 mg/dL — AB (ref 6–20)
CALCIUM ION: 1.14 mmol/L — AB (ref 1.15–1.40)
CALCIUM ION: 0.97 mmol/L — AB (ref 1.15–1.40)
CALCIUM ION: 1.06 mmol/L — AB (ref 1.15–1.40)
CALCIUM ION: 1.25 mmol/L (ref 1.15–1.40)
CHLORIDE: 102 mmol/L (ref 101–111)
CHLORIDE: 99 mmol/L — AB (ref 101–111)
CREATININE: 0.6 mg/dL — AB (ref 0.61–1.24)
CREATININE: 0.5 mg/dL — AB (ref 0.61–1.24)
CREATININE: 0.4 mg/dL — AB (ref 0.61–1.24)
Calcium, Ion: 1.24 mmol/L (ref 1.15–1.40)
Chloride: 99 mmol/L — ABNORMAL LOW (ref 101–111)
Chloride: 99 mmol/L — ABNORMAL LOW (ref 101–111)
Chloride: 102 mmol/L (ref 101–111)
Creatinine, Ser: 0.4 mg/dL — ABNORMAL LOW (ref 0.61–1.24)
Creatinine, Ser: 0.4 mg/dL — ABNORMAL LOW (ref 0.61–1.24)
GLUCOSE: 104 mg/dL — AB (ref 65–99)
GLUCOSE: 109 mg/dL — AB (ref 65–99)
GLUCOSE: 101 mg/dL — AB (ref 65–99)
GLUCOSE: 128 mg/dL — AB (ref 65–99)
Glucose, Bld: 161 mg/dL — ABNORMAL HIGH (ref 65–99)
HCT: 27 % — ABNORMAL LOW (ref 39.0–52.0)
HCT: 21 % — ABNORMAL LOW (ref 39.0–52.0)
HCT: 24 % — ABNORMAL LOW (ref 39.0–52.0)
HCT: 25 % — ABNORMAL LOW (ref 39.0–52.0)
HEMATOCRIT: 29 % — AB (ref 39.0–52.0)
HEMOGLOBIN: 9.9 g/dL — AB (ref 13.0–17.0)
HEMOGLOBIN: 8.2 g/dL — AB (ref 13.0–17.0)
HEMOGLOBIN: 8.5 g/dL — AB (ref 13.0–17.0)
Hemoglobin: 9.2 g/dL — ABNORMAL LOW (ref 13.0–17.0)
Hemoglobin: 7.1 g/dL — ABNORMAL LOW (ref 13.0–17.0)
POTASSIUM: 4.4 mmol/L (ref 3.5–5.1)
Potassium: 4.3 mmol/L (ref 3.5–5.1)
Potassium: 4.6 mmol/L (ref 3.5–5.1)
Potassium: 4.7 mmol/L (ref 3.5–5.1)
Potassium: 4.1 mmol/L (ref 3.5–5.1)
SODIUM: 135 mmol/L (ref 135–145)
SODIUM: 139 mmol/L (ref 135–145)
Sodium: 138 mmol/L (ref 135–145)
Sodium: 137 mmol/L (ref 135–145)
Sodium: 138 mmol/L (ref 135–145)
TCO2: 30 mmol/L (ref 22–32)
TCO2: 28 mmol/L (ref 22–32)
TCO2: 25 mmol/L (ref 22–32)
TCO2: 27 mmol/L (ref 22–32)
TCO2: 25 mmol/L (ref 22–32)

## 2017-02-24 LAB — POCT I-STAT 3, ART BLOOD GAS (G3+)
ACID-BASE DEFICIT: 1 mmol/L (ref 0.0–2.0)
ACID-BASE DEFICIT: 1 mmol/L (ref 0.0–2.0)
ACID-BASE EXCESS: 2 mmol/L (ref 0.0–2.0)
Acid-base deficit: 3 mmol/L — ABNORMAL HIGH (ref 0.0–2.0)
BICARBONATE: 25.5 mmol/L (ref 20.0–28.0)
BICARBONATE: 24.1 mmol/L (ref 20.0–28.0)
Bicarbonate: 24.4 mmol/L (ref 20.0–28.0)
Bicarbonate: 22.4 mmol/L (ref 20.0–28.0)
O2 SAT: 100 %
O2 SAT: 98 %
O2 SAT: 97 %
O2 Saturation: 98 %
PCO2 ART: 40.8 mmHg (ref 32.0–48.0)
PH ART: 7.4 (ref 7.350–7.450)
PH ART: 7.349 — AB (ref 7.350–7.450)
PO2 ART: 92 mmHg (ref 83.0–108.0)
PO2 ART: 108 mmHg (ref 83.0–108.0)
Patient temperature: 36.9
Patient temperature: 37.2
TCO2: 27 mmol/L (ref 22–32)
TCO2: 25 mmol/L (ref 22–32)
TCO2: 26 mmol/L (ref 22–32)
TCO2: 24 mmol/L (ref 22–32)
pCO2 arterial: 34.5 mmHg (ref 32.0–48.0)
pCO2 arterial: 38.5 mmHg (ref 32.0–48.0)
pCO2 arterial: 43 mmHg (ref 32.0–48.0)
pH, Arterial: 7.477 — ABNORMAL HIGH (ref 7.350–7.450)
pH, Arterial: 7.362 (ref 7.350–7.450)
pO2, Arterial: 343 mmHg — ABNORMAL HIGH (ref 83.0–108.0)
pO2, Arterial: 99 mmHg (ref 83.0–108.0)

## 2017-02-24 LAB — POCT I-STAT 4, (NA,K, GLUC, HGB,HCT)
GLUCOSE: 131 mg/dL — AB (ref 65–99)
HEMATOCRIT: 27 % — AB (ref 39.0–52.0)
Hemoglobin: 9.2 g/dL — ABNORMAL LOW (ref 13.0–17.0)
POTASSIUM: 3.7 mmol/L (ref 3.5–5.1)
Sodium: 140 mmol/L (ref 135–145)

## 2017-02-24 LAB — CBC
HCT: 27.3 % — ABNORMAL LOW (ref 39.0–52.0)
HCT: 26.4 % — ABNORMAL LOW (ref 39.0–52.0)
Hemoglobin: 9.1 g/dL — ABNORMAL LOW (ref 13.0–17.0)
Hemoglobin: 8.6 g/dL — ABNORMAL LOW (ref 13.0–17.0)
MCH: 31.8 pg (ref 26.0–34.0)
MCH: 30.8 pg (ref 26.0–34.0)
MCHC: 33.3 g/dL (ref 30.0–36.0)
MCHC: 32.6 g/dL (ref 30.0–36.0)
MCV: 95.5 fL (ref 78.0–100.0)
MCV: 94.6 fL (ref 78.0–100.0)
PLATELETS: 121 10*3/uL — AB (ref 150–400)
Platelets: 154 10*3/uL (ref 150–400)
RBC: 2.86 MIL/uL — AB (ref 4.22–5.81)
RBC: 2.79 MIL/uL — ABNORMAL LOW (ref 4.22–5.81)
RDW: 13.6 % (ref 11.5–15.5)
RDW: 13.1 % (ref 11.5–15.5)
WBC: 11.3 10*3/uL — ABNORMAL HIGH (ref 4.0–10.5)
WBC: 11.4 10*3/uL — ABNORMAL HIGH (ref 4.0–10.5)

## 2017-02-24 LAB — BASIC METABOLIC PANEL
Anion gap: 11 (ref 5–15)
BUN: <5 mg/dL — ABNORMAL LOW (ref 6–20)
CALCIUM: 8.6 mg/dL — AB (ref 8.9–10.3)
CO2: 24 mmol/L (ref 22–32)
Chloride: 102 mmol/L (ref 101–111)
Creatinine, Ser: 0.7 mg/dL (ref 0.61–1.24)
Glucose, Bld: 106 mg/dL — ABNORMAL HIGH (ref 65–99)
POTASSIUM: 4.2 mmol/L (ref 3.5–5.1)
Sodium: 137 mmol/L (ref 135–145)

## 2017-02-24 LAB — GLUCOSE, CAPILLARY
GLUCOSE-CAPILLARY: 109 mg/dL — AB (ref 65–99)
GLUCOSE-CAPILLARY: 114 mg/dL — AB (ref 65–99)
GLUCOSE-CAPILLARY: 114 mg/dL — AB (ref 65–99)
GLUCOSE-CAPILLARY: 96 mg/dL (ref 65–99)
Glucose-Capillary: 111 mg/dL — ABNORMAL HIGH (ref 65–99)
Glucose-Capillary: 117 mg/dL — ABNORMAL HIGH (ref 65–99)
Glucose-Capillary: 143 mg/dL — ABNORMAL HIGH (ref 65–99)
Glucose-Capillary: 127 mg/dL — ABNORMAL HIGH (ref 65–99)

## 2017-02-24 LAB — MAGNESIUM: Magnesium: 2.6 mg/dL — ABNORMAL HIGH (ref 1.7–2.4)

## 2017-02-24 LAB — HEMOGLOBIN AND HEMATOCRIT, BLOOD
HCT: 21 % — ABNORMAL LOW (ref 39.0–52.0)
Hemoglobin: 6.9 g/dL — CL (ref 13.0–17.0)

## 2017-02-24 LAB — APTT: aPTT: 33 seconds (ref 24–36)

## 2017-02-24 LAB — ECHO TEE
LVOT area: 3.46 cm2
LVOT diameter: 21 mm
PISA EROA: 0.19 cm2
VTI: 129 cm

## 2017-02-24 LAB — PROTIME-INR
INR: 1.32
Prothrombin Time: 16.3 seconds — ABNORMAL HIGH (ref 11.4–15.2)

## 2017-02-24 LAB — CREATININE, SERUM
Creatinine, Ser: 0.63 mg/dL (ref 0.61–1.24)
GFR calc Af Amer: >60 mL/min (ref >60)
GFR calc non Af Amer: >60 mL/min (ref >60)

## 2017-02-24 LAB — PREPARE RBC (CROSSMATCH)

## 2017-02-24 LAB — HEPARIN LEVEL (UNFRACTIONATED): Heparin Unfractionated: 0.43 IU/mL (ref 0.30–0.70)

## 2017-02-24 LAB — PLATELET COUNT: Platelets: 122 10*3/uL — ABNORMAL LOW (ref 150–400)

## 2017-02-24 LAB — COOXEMETRY PANEL
Carboxyhemoglobin: 1 % (ref 0.5–1.5)
Methemoglobin: 0.9 % (ref 0.0–1.5)
O2 Saturation: 68 %
Total hemoglobin: 9 g/dL — ABNORMAL LOW (ref 12.0–16.0)

## 2017-02-24 SURGERY — CORONARY ARTERY BYPASS GRAFTING (CABG)
Anesthesia: General | Site: Chest

## 2017-02-24 MED ORDER — SODIUM CHLORIDE 0.9% FLUSH
3.0000 mL | Freq: Two times a day (BID) | INTRAVENOUS | Status: DC
  Administered 2017-02-25: 10 mL via INTRAVENOUS
  Administered 2017-02-26: 3 mL via INTRAVENOUS
  Administered 2017-02-26: 10 mL via INTRAVENOUS
  Administered 2017-02-27: 3 mL via INTRAVENOUS

## 2017-02-24 MED ORDER — LACTATED RINGERS IV SOLN: INTRAVENOUS | Status: DC

## 2017-02-24 MED ORDER — POTASSIUM CHLORIDE 10 MEQ/50ML IV SOLN
10.0000 meq | INTRAVENOUS | Status: AC
  Administered 2017-02-24 (×3): 10 meq via INTRAVENOUS

## 2017-02-24 MED ORDER — EPHEDRINE SULFATE 50 MG/ML IJ SOLN
INTRAMUSCULAR | Status: DC | PRN
  Administered 2017-02-24: 10 mg via INTRAVENOUS

## 2017-02-24 MED ORDER — CHLORHEXIDINE GLUCONATE 0.12 % MT SOLN
15.0000 mL | OROMUCOSAL | Status: AC
  Administered 2017-02-24: 15 mL via OROMUCOSAL
  Filled 2017-02-24: qty 15

## 2017-02-24 MED ORDER — PHENYLEPHRINE 40 MCG/ML (10ML) SYRINGE FOR IV PUSH (FOR BLOOD PRESSURE SUPPORT)
PREFILLED_SYRINGE | INTRAVENOUS | Status: AC
  Filled 2017-02-24: qty 10

## 2017-02-24 MED ORDER — SODIUM CHLORIDE 0.9% FLUSH: 10.0000 mL | INTRAVENOUS | Status: DC | PRN

## 2017-02-24 MED ORDER — SODIUM CHLORIDE 0.9 % IV SOLN
INTRAVENOUS | Status: DC | PRN
  Administered 2017-02-24: 12:00:00 via INTRAVENOUS

## 2017-02-24 MED ORDER — MIDAZOLAM HCL 10 MG/2ML IJ SOLN
INTRAMUSCULAR | Status: AC
  Filled 2017-02-24: qty 2

## 2017-02-24 MED ORDER — ORAL CARE MOUTH RINSE
15.0000 mL | Freq: Four times a day (QID) | OROMUCOSAL | Status: DC
  Administered 2017-02-25 (×2): 15 mL via OROMUCOSAL

## 2017-02-24 MED ORDER — SODIUM CHLORIDE 0.9 % IV SOLN: 250.0000 mL | INTRAVENOUS | Status: DC

## 2017-02-24 MED ORDER — MORPHINE SULFATE (PF) 4 MG/ML IV SOLN
2.0000 mg | INTRAVENOUS | Status: DC | PRN
  Administered 2017-02-24 – 2017-02-25 (×3): 4 mg via INTRAVENOUS
  Filled 2017-02-24 (×4): qty 1

## 2017-02-24 MED ORDER — ETOMIDATE 2 MG/ML IV SOLN
INTRAVENOUS | Status: AC
  Filled 2017-02-24: qty 10

## 2017-02-24 MED ORDER — TRAMADOL HCL 50 MG PO TABS
50.0000 mg | ORAL_TABLET | ORAL | Status: DC | PRN
  Administered 2017-02-24 – 2017-02-25 (×2): 100 mg via ORAL
  Filled 2017-02-24 (×2): qty 2

## 2017-02-24 MED ORDER — LIDOCAINE HCL (CARDIAC) 20 MG/ML IV SOLN: INTRAVENOUS | Status: DC | PRN

## 2017-02-24 MED ORDER — DOCUSATE SODIUM 100 MG PO CAPS
200.0000 mg | ORAL_CAPSULE | Freq: Every day | ORAL | Status: DC
  Administered 2017-02-25 – 2017-03-01 (×5): 200 mg via ORAL
  Filled 2017-02-24 (×6): qty 2

## 2017-02-24 MED ORDER — ALBUMIN HUMAN 5 % IV SOLN
250.0000 mL | INTRAVENOUS | Status: AC | PRN
  Administered 2017-02-24 (×4): 250 mL via INTRAVENOUS
  Filled 2017-02-24 (×2): qty 250

## 2017-02-24 MED ORDER — SODIUM CHLORIDE 0.9% FLUSH
10.0000 mL | Freq: Two times a day (BID) | INTRAVENOUS | Status: DC
  Administered 2017-02-25: 10 mL
  Administered 2017-02-26: 40 mL
  Administered 2017-02-27 (×2): 10 mL

## 2017-02-24 MED ORDER — HEPARIN SODIUM (PORCINE) 1000 UNIT/ML IJ SOLN
INTRAMUSCULAR | Status: DC | PRN
  Administered 2017-02-24: 25000 [IU] via INTRAVENOUS

## 2017-02-24 MED ORDER — MIDAZOLAM HCL 2 MG/2ML IJ SOLN
INTRAMUSCULAR | Status: AC
  Filled 2017-02-24: qty 2

## 2017-02-24 MED ORDER — MIDAZOLAM HCL 2 MG/2ML IJ SOLN
2.0000 mg | INTRAMUSCULAR | Status: DC | PRN
Start: 2017-02-24 — End: 2017-02-28

## 2017-02-24 MED ORDER — CHLORHEXIDINE GLUCONATE 0.12% ORAL RINSE (MEDLINE KIT)
15.0000 mL | Freq: Two times a day (BID) | OROMUCOSAL | Status: DC
  Administered 2017-02-24: 15 mL via OROMUCOSAL

## 2017-02-24 MED ORDER — 0.9 % SODIUM CHLORIDE (POUR BTL) OPTIME
TOPICAL | Status: DC | PRN
  Administered 2017-02-24: 6000 mL

## 2017-02-24 MED ORDER — ACETAMINOPHEN 650 MG RE SUPP
650.0000 mg | Freq: Once | RECTAL | Status: AC
  Administered 2017-02-24: 650 mg via RECTAL

## 2017-02-24 MED ORDER — PANTOPRAZOLE SODIUM 40 MG PO TBEC
40.0000 mg | DELAYED_RELEASE_TABLET | Freq: Every day | ORAL | Status: DC
  Administered 2017-02-26 – 2017-03-02 (×5): 40 mg via ORAL
  Filled 2017-02-24 (×5): qty 1

## 2017-02-24 MED ORDER — ACETAMINOPHEN 160 MG/5ML PO SOLN: 1000.0000 mg | Freq: Four times a day (QID) | ORAL | Status: AC

## 2017-02-24 MED ORDER — OXYCODONE HCL 5 MG PO TABS
5.0000 mg | ORAL_TABLET | ORAL | Status: DC | PRN
  Administered 2017-02-25 (×5): 10 mg via ORAL
  Filled 2017-02-24 (×5): qty 2

## 2017-02-24 MED ORDER — METOPROLOL TARTRATE 25 MG/10 ML ORAL SUSPENSION: 12.5000 mg | Freq: Two times a day (BID) | ORAL | Status: DC

## 2017-02-24 MED ORDER — FENTANYL CITRATE (PF) 250 MCG/5ML IJ SOLN
INTRAMUSCULAR | Status: AC
  Filled 2017-02-24: qty 25

## 2017-02-24 MED ORDER — ROCURONIUM BROMIDE 10 MG/ML (PF) SYRINGE
PREFILLED_SYRINGE | INTRAVENOUS | Status: AC
  Filled 2017-02-24: qty 5

## 2017-02-24 MED ORDER — DOPAMINE-DEXTROSE 3.2-5 MG/ML-% IV SOLN
3.0000 ug/kg/min | INTRAVENOUS | Status: DC
  Administered 2017-02-25: 5 ug/kg/min via INTRAVENOUS
  Filled 2017-02-24: qty 250

## 2017-02-24 MED ORDER — METOPROLOL TARTRATE 5 MG/5ML IV SOLN: 2.5000 mg | INTRAVENOUS | Status: DC | PRN

## 2017-02-24 MED ORDER — INSULIN REGULAR BOLUS VIA INFUSION
0.0000 [IU] | Freq: Three times a day (TID) | INTRAVENOUS | Status: DC
  Filled 2017-02-24: qty 10

## 2017-02-24 MED ORDER — PROTAMINE SULFATE 10 MG/ML IV SOLN
INTRAVENOUS | Status: AC
  Filled 2017-02-24: qty 25

## 2017-02-24 MED ORDER — METOPROLOL TARTRATE 12.5 MG HALF TABLET: 12.5000 mg | ORAL_TABLET | Freq: Two times a day (BID) | ORAL | Status: DC

## 2017-02-24 MED ORDER — CHLORHEXIDINE GLUCONATE CLOTH 2 % EX PADS
6.0000 | MEDICATED_PAD | Freq: Every day | CUTANEOUS | Status: DC
  Administered 2017-02-24 – 2017-02-27 (×3): 6 via TOPICAL

## 2017-02-24 MED ORDER — HEMOSTATIC AGENTS (NO CHARGE) OPTIME
TOPICAL | Status: DC | PRN
  Administered 2017-02-24: 1 via TOPICAL

## 2017-02-24 MED ORDER — ROCURONIUM BROMIDE 10 MG/ML (PF) SYRINGE
PREFILLED_SYRINGE | INTRAVENOUS | Status: DC | PRN
  Administered 2017-02-24 (×2): 30 mg via INTRAVENOUS
  Administered 2017-02-24 (×2): 50 mg via INTRAVENOUS
  Administered 2017-02-24: 70 mg via INTRAVENOUS
  Administered 2017-02-24: 50 mg via INTRAVENOUS

## 2017-02-24 MED ORDER — SODIUM CHLORIDE 0.9 % IV SOLN
0.0000 ug/min | INTRAVENOUS | Status: DC
  Administered 2017-02-24: 50 ug/min via INTRAVENOUS
  Administered 2017-02-25: 45 ug/min via INTRAVENOUS
  Administered 2017-02-25: 35 ug/min via INTRAVENOUS
  Administered 2017-02-26: 25 ug/min via INTRAVENOUS
  Filled 2017-02-24 (×2): qty 20
  Filled 2017-02-24 (×2): qty 2

## 2017-02-24 MED ORDER — LIDOCAINE 2% (20 MG/ML) 5 ML SYRINGE
INTRAMUSCULAR | Status: DC | PRN
  Administered 2017-02-24: 60 mg via INTRAVENOUS

## 2017-02-24 MED ORDER — DEXMEDETOMIDINE HCL IN NACL 200 MCG/50ML IV SOLN
0.0000 ug/kg/h | INTRAVENOUS | Status: DC
  Administered 2017-02-24: 0.1 ug/kg/h via INTRAVENOUS
  Administered 2017-02-25: 0.2 ug/kg/h via INTRAVENOUS
  Filled 2017-02-24 (×2): qty 50

## 2017-02-24 MED ORDER — SODIUM CHLORIDE 0.9 % IJ SOLN
INTRAMUSCULAR | Status: AC
  Filled 2017-02-24: qty 10

## 2017-02-24 MED ORDER — SODIUM CHLORIDE 0.9 % IV SOLN
INTRAVENOUS | Status: DC | PRN
  Administered 2017-02-24: 750 mg via INTRAVENOUS

## 2017-02-24 MED ORDER — HEPARIN SODIUM (PORCINE) 1000 UNIT/ML IJ SOLN
INTRAMUSCULAR | Status: AC
Start: 2017-02-24 — End: 2017-02-24
  Filled 2017-02-24: qty 1

## 2017-02-24 MED ORDER — SODIUM CHLORIDE 0.45 % IV SOLN
INTRAVENOUS | Status: DC | PRN
  Administered 2017-02-24 – 2017-02-25 (×2): via INTRAVENOUS

## 2017-02-24 MED ORDER — PHENYLEPHRINE 40 MCG/ML (10ML) SYRINGE FOR IV PUSH (FOR BLOOD PRESSURE SUPPORT)
PREFILLED_SYRINGE | INTRAVENOUS | Status: DC | PRN
  Administered 2017-02-24: 80 ug via INTRAVENOUS
  Administered 2017-02-24: 40 ug via INTRAVENOUS
  Administered 2017-02-24: 120 ug via INTRAVENOUS
  Administered 2017-02-24 (×2): 80 ug via INTRAVENOUS

## 2017-02-24 MED ORDER — VANCOMYCIN HCL IN DEXTROSE 1-5 GM/200ML-% IV SOLN
1000.0000 mg | Freq: Once | INTRAVENOUS | Status: AC
  Administered 2017-02-24: 1000 mg via INTRAVENOUS
  Filled 2017-02-24: qty 200

## 2017-02-24 MED ORDER — ACETAMINOPHEN 160 MG/5ML PO SOLN: 650.0000 mg | Freq: Once | ORAL | Status: AC

## 2017-02-24 MED ORDER — NITROGLYCERIN IN D5W 200-5 MCG/ML-% IV SOLN: 0.0000 ug/min | INTRAVENOUS | Status: DC

## 2017-02-24 MED ORDER — ACETAMINOPHEN 500 MG PO TABS
1000.0000 mg | ORAL_TABLET | Freq: Four times a day (QID) | ORAL | Status: AC
  Administered 2017-02-25 – 2017-03-01 (×20): 1000 mg via ORAL
  Filled 2017-02-24 (×20): qty 2

## 2017-02-24 MED ORDER — LACTATED RINGERS IV SOLN
INTRAVENOUS | Status: DC | PRN
  Administered 2017-02-24 (×2): via INTRAVENOUS

## 2017-02-24 MED ORDER — MILRINONE LACTATE IN DEXTROSE 20-5 MG/100ML-% IV SOLN: 0.3000 ug/kg/min | INTRAVENOUS | Status: DC

## 2017-02-24 MED ORDER — MILRINONE LACTATE IN DEXTROSE 20-5 MG/100ML-% IV SOLN
0.1500 ug/kg/min | INTRAVENOUS | Status: DC
  Administered 2017-02-24: 0.15 ug/kg/min via INTRAVENOUS
  Filled 2017-02-24: qty 100

## 2017-02-24 MED ORDER — PROPOFOL 10 MG/ML IV BOLUS
INTRAVENOUS | Status: AC
  Filled 2017-02-24: qty 20

## 2017-02-24 MED ORDER — LACTATED RINGERS IV SOLN: 500.0000 mL | Freq: Once | INTRAVENOUS | Status: DC | PRN

## 2017-02-24 MED ORDER — MAGNESIUM SULFATE 4 GM/100ML IV SOLN
4.0000 g | Freq: Once | INTRAVENOUS | Status: AC
  Administered 2017-02-24: 4 g via INTRAVENOUS
  Filled 2017-02-24: qty 100

## 2017-02-24 MED ORDER — BISACODYL 5 MG PO TBEC
10.0000 mg | DELAYED_RELEASE_TABLET | Freq: Every day | ORAL | Status: DC
  Administered 2017-02-25 – 2017-03-01 (×5): 10 mg via ORAL
  Filled 2017-02-24 (×6): qty 2

## 2017-02-24 MED ORDER — SODIUM CHLORIDE 0.9 % IV SOLN
INTRAVENOUS | Status: DC
  Administered 2017-02-26: 11:00:00 via INTRAVENOUS

## 2017-02-24 MED ORDER — HEMOSTATIC AGENTS (NO CHARGE) OPTIME
TOPICAL | Status: DC | PRN
  Administered 2017-02-24: 3 via TOPICAL

## 2017-02-24 MED ORDER — BISACODYL 10 MG RE SUPP: 10.0000 mg | Freq: Every day | RECTAL | Status: DC

## 2017-02-24 MED ORDER — FENTANYL CITRATE (PF) 250 MCG/5ML IJ SOLN
INTRAMUSCULAR | Status: DC | PRN
  Administered 2017-02-24: 100 ug via INTRAVENOUS
  Administered 2017-02-24: 900 ug via INTRAVENOUS
  Administered 2017-02-24: 50 ug via INTRAVENOUS
  Administered 2017-02-24: 100 ug via INTRAVENOUS
  Administered 2017-02-24 (×2): 50 ug via INTRAVENOUS

## 2017-02-24 MED ORDER — MILRINONE LACTATE IN DEXTROSE 20-5 MG/100ML-% IV SOLN
INTRAVENOUS | Status: DC | PRN
  Administered 2017-02-24: 0.375 ug/kg/min via INTRAVENOUS

## 2017-02-24 MED ORDER — FAMOTIDINE IN NACL 20-0.9 MG/50ML-% IV SOLN
20.0000 mg | Freq: Two times a day (BID) | INTRAVENOUS | Status: AC
  Administered 2017-02-24: 20 mg via INTRAVENOUS

## 2017-02-24 MED ORDER — SODIUM CHLORIDE 0.9% FLUSH: 3.0000 mL | INTRAVENOUS | Status: DC | PRN

## 2017-02-24 MED ORDER — PROTAMINE SULFATE 10 MG/ML IV SOLN
INTRAVENOUS | Status: DC | PRN
  Administered 2017-02-24: 250 mg via INTRAVENOUS

## 2017-02-24 MED ORDER — MORPHINE SULFATE (PF) 4 MG/ML IV SOLN
1.0000 mg | INTRAVENOUS | Status: AC | PRN
  Administered 2017-02-24: 2 mg via INTRAVENOUS

## 2017-02-24 MED ORDER — ASPIRIN EC 325 MG PO TBEC
325.0000 mg | DELAYED_RELEASE_TABLET | Freq: Every day | ORAL | Status: DC
  Administered 2017-02-25 – 2017-03-02 (×6): 325 mg via ORAL
  Filled 2017-02-24 (×6): qty 1

## 2017-02-24 MED ORDER — ONDANSETRON HCL 4 MG/2ML IJ SOLN
4.0000 mg | Freq: Four times a day (QID) | INTRAMUSCULAR | Status: DC | PRN
  Administered 2017-02-24 – 2017-02-25 (×2): 4 mg via INTRAVENOUS
  Filled 2017-02-24 (×2): qty 2

## 2017-02-24 MED ORDER — ALBUMIN HUMAN 5 % IV SOLN
INTRAVENOUS | Status: DC | PRN
  Administered 2017-02-24 (×2): via INTRAVENOUS

## 2017-02-24 MED ORDER — PHENYLEPHRINE HCL 10 MG/ML IJ SOLN
INTRAVENOUS | Status: DC | PRN
  Administered 2017-02-24: 15 ug/min via INTRAVENOUS

## 2017-02-24 MED ORDER — LACTATED RINGERS IV SOLN
INTRAVENOUS | Status: DC
  Administered 2017-02-25: 20:00:00 via INTRAVENOUS

## 2017-02-24 MED ORDER — SODIUM CHLORIDE 0.9 % IV SOLN
INTRAVENOUS | Status: DC
  Filled 2017-02-24: qty 1

## 2017-02-24 MED ORDER — ASPIRIN 81 MG PO CHEW
324.0000 mg | CHEWABLE_TABLET | Freq: Every day | ORAL | Status: DC
  Filled 2017-02-24: qty 4

## 2017-02-24 MED ORDER — SODIUM CHLORIDE 0.9 % IV SOLN
1.5000 g | Freq: Two times a day (BID) | INTRAVENOUS | Status: AC
  Administered 2017-02-24 – 2017-02-26 (×4): 1.5 g via INTRAVENOUS
  Filled 2017-02-24 (×4): qty 1.5

## 2017-02-24 MED ORDER — AMIODARONE HCL IN DEXTROSE 360-4.14 MG/200ML-% IV SOLN
30.0000 mg/h | INTRAVENOUS | Status: DC
  Administered 2017-02-24 – 2017-02-27 (×7): 30 mg/h via INTRAVENOUS
  Filled 2017-02-24 (×6): qty 200

## 2017-02-24 MED ORDER — PROPOFOL 10 MG/ML IV BOLUS
INTRAVENOUS | Status: DC | PRN
  Administered 2017-02-24: 20 mg via INTRAVENOUS

## 2017-02-24 MED ORDER — MIDAZOLAM HCL 5 MG/5ML IJ SOLN
INTRAMUSCULAR | Status: DC | PRN
  Administered 2017-02-24 (×4): 2 mg via INTRAVENOUS
  Administered 2017-02-24: 3 mg via INTRAVENOUS
  Administered 2017-02-24: 1 mg via INTRAVENOUS

## 2017-02-24 SURGICAL SUPPLY — 68 items
BAG DECANTER FOR FLEXI CONT (MISCELLANEOUS) ×3 IMPLANT
BANDAGE ACE 4X5 VEL STRL LF (GAUZE/BANDAGES/DRESSINGS) ×3 IMPLANT
BANDAGE ACE 6X5 VEL STRL LF (GAUZE/BANDAGES/DRESSINGS) ×3 IMPLANT
BLADE NDL 3 SS STRL (BLADE) IMPLANT
BLADE NEEDLE 3 SS STRL (BLADE) ×3 IMPLANT
BLADE STERNUM SYSTEM 6 (BLADE) ×3 IMPLANT
BNDG GAUZE ELAST 4 BULKY (GAUZE/BANDAGES/DRESSINGS) ×3 IMPLANT
CANISTER SUCT 3000ML PPV (MISCELLANEOUS) ×3 IMPLANT
CATH CPB KIT GERHARDT (MISCELLANEOUS) ×3 IMPLANT
CATH THORACIC 28FR (CATHETERS) ×3 IMPLANT
CRADLE DONUT ADULT HEAD (MISCELLANEOUS) ×3 IMPLANT
DRAIN CHANNEL 28F RND 3/8 FF (WOUND CARE) ×3 IMPLANT
DRAPE CARDIOVASCULAR INCISE (DRAPES) ×3
DRAPE SLUSH/WARMER DISC (DRAPES) ×3 IMPLANT
DRAPE SRG 135X102X78XABS (DRAPES) ×2 IMPLANT
DRSG AQUACEL AG ADV 3.5X14 (GAUZE/BANDAGES/DRESSINGS) ×3 IMPLANT
ELECT BLADE 4.0 EZ CLEAN MEGAD (MISCELLANEOUS) ×3
ELECT REM PT RETURN 9FT ADLT (ELECTROSURGICAL) ×6
ELECTRODE BLDE 4.0 EZ CLN MEGD (MISCELLANEOUS) ×2 IMPLANT
ELECTRODE REM PT RTRN 9FT ADLT (ELECTROSURGICAL) ×4 IMPLANT
FELT TEFLON 1X6 (MISCELLANEOUS) ×6 IMPLANT
FLOSEAL 10ML (HEMOSTASIS) ×1 IMPLANT
GAUZE SPONGE 4X4 12PLY STRL (GAUZE/BANDAGES/DRESSINGS) ×6 IMPLANT
GAUZE SPONGE 4X4 12PLY STRL LF (GAUZE/BANDAGES/DRESSINGS) ×2 IMPLANT
GLOVE BIO SURGEON STRL SZ 6.5 (GLOVE) ×9 IMPLANT
GOWN STRL REUS W/ TWL LRG LVL3 (GOWN DISPOSABLE) ×8 IMPLANT
GOWN STRL REUS W/TWL LRG LVL3 (GOWN DISPOSABLE) ×12
HEMOSTAT POWDER SURGIFOAM 1G (HEMOSTASIS) ×9 IMPLANT
HEMOSTAT SURGICEL 2X14 (HEMOSTASIS) ×3 IMPLANT
INSERT FOGARTY 61MM (MISCELLANEOUS) ×1 IMPLANT
KIT BASIN OR (CUSTOM PROCEDURE TRAY) ×3 IMPLANT
KIT CATH SUCT 8FR (CATHETERS) ×3 IMPLANT
KIT ROOM TURNOVER OR (KITS) ×3 IMPLANT
KIT SUCTION CATH 14FR (SUCTIONS) ×6 IMPLANT
KIT VASOVIEW HEMOPRO VH 3000 (KITS) ×3 IMPLANT
LEAD PACING MYOCARDI (MISCELLANEOUS) ×3 IMPLANT
MARKER GRAFT CORONARY BYPASS (MISCELLANEOUS) ×9 IMPLANT
NS IRRIG 1000ML POUR BTL (IV SOLUTION) ×15 IMPLANT
PACK E OPEN HEART (SUTURE) ×3 IMPLANT
PACK OPEN HEART (CUSTOM PROCEDURE TRAY) ×3 IMPLANT
PAD ARMBOARD 7.5X6 YLW CONV (MISCELLANEOUS) ×6 IMPLANT
PAD ELECT DEFIB RADIOL ZOLL (MISCELLANEOUS) ×3 IMPLANT
PADS RED FLOOR 20  5/BX (PAD) ×1 IMPLANT
PENCIL BUTTON HOLSTER BLD 10FT (ELECTRODE) ×3 IMPLANT
PUNCH AORTIC ROTATE  4.5MM 8IN (MISCELLANEOUS) ×1 IMPLANT
SET CARDIOPLEGIA MPS 5001102 (MISCELLANEOUS) ×1 IMPLANT
SOLUTION ANTI FOG 6CC (MISCELLANEOUS) ×1 IMPLANT
SUT BONE WAX W31G (SUTURE) ×3 IMPLANT
SUT MNCRL AB 4-0 PS2 18 (SUTURE) ×1 IMPLANT
SUT PROLENE 3 0 SH1 36 (SUTURE) ×3 IMPLANT
SUT PROLENE 4 0 TF (SUTURE) ×6 IMPLANT
SUT PROLENE 6 0 C 1 30 (SUTURE) ×2 IMPLANT
SUT PROLENE 6 0 CC (SUTURE) ×6 IMPLANT
SUT PROLENE 7 0 BV1 MDA (SUTURE) ×3 IMPLANT
SUT PROLENE 8 0 BV175 6 (SUTURE) ×1 IMPLANT
SUT STEEL 6MS V (SUTURE) ×3 IMPLANT
SUT STEEL SZ 6 DBL 3X14 BALL (SUTURE) ×3 IMPLANT
SUT VIC AB 1 CTX 18 (SUTURE) ×6 IMPLANT
SUT VIC AB 2-0 CT1 27 (SUTURE) ×3
SUT VIC AB 2-0 CT1 TAPERPNT 27 (SUTURE) IMPLANT
SYSTEM SAHARA CHEST DRAIN ATS (WOUND CARE) ×3 IMPLANT
TAPE CLOTH SURG 4X10 WHT LF (GAUZE/BANDAGES/DRESSINGS) ×1 IMPLANT
TOWEL GREEN STERILE (TOWEL DISPOSABLE) ×3 IMPLANT
TOWEL GREEN STERILE FF (TOWEL DISPOSABLE) ×3 IMPLANT
TRAY FOLEY SILVER 16FR TEMP (SET/KITS/TRAYS/PACK) ×3 IMPLANT
TUBING INSUFFLATION (TUBING) ×3 IMPLANT
UNDERPAD 30X30 (UNDERPADS AND DIAPERS) ×3 IMPLANT
WATER STERILE IRR 1000ML POUR (IV SOLUTION) ×6 IMPLANT

## 2017-02-24 NOTE — Progress Notes (Signed)
TCTS BRIEF SICU PROGRESS NOTE  Day of Surgery  S/P Procedure(s) (LRB): CORONARY ARTERY BYPASS GRAFTING (CABG) x 3 ON PUMP USING LEFT INTERNAL MAMMARY ARTERY TO LEFT ANTERIOR DESENDING CORNARY ARTERY, RIGHT GREATER SAPHENOUS VEIN TO LEFT CIRCUMFLEX ARTERY AND POSTERIOR DESENDING ARTERY. RIGHT GREATER SAPHENOUS VEIN OBTAINED VIA ENDOVEIN HARVEST. (N/A) TRANSESOPHAGEAL ECHOCARDIOGRAM (TEE) (N/A)   Just extubated, neuro grossly intact AAI paced w/ stable hemodynamics w/ IABP 1:1, low dose milrinone, levophed, dopamine and Neo Cardiac output relatively high, mixed venous co-ox 68% Chest tube output low Excellent UOP Labs okay  Plan: Continue routine early postop  Purcell Nailslarence H Lawana Hartzell, MD 02/24/2017 6:06 PM

## 2017-02-24 NOTE — Anesthesia Procedure Notes (Signed)
Central Venous Catheter Insertion Performed by: Arta Brucessey, Blima Jaimes, MD, anesthesiologist Start/End2/11/2017 7:35 AM, 02/24/2017 7:45 AM Patient location: Pre-op. Preanesthetic checklist: patient identified, IV checked, risks and benefits discussed, surgical consent, monitors and equipment checked, pre-op evaluation, timeout performed and anesthesia consent Lidocaine 1% used for infiltration and patient sedated Hand hygiene performed  and maximum sterile barriers used  Catheter size: 8.5 Fr Central line and PA cath was placed.Sheath introducer Swan type:thermodilution PA Cath depth:48 Procedure performed using ultrasound guided technique. Ultrasound Notes:anatomy identified, needle tip was noted to be adjacent to the nerve/plexus identified, no ultrasound evidence of intravascular and/or intraneural injection and image(s) printed for medical record Attempts: 1 Following insertion, line sutured, dressing applied and Biopatch. Post procedure assessment: blood return through all ports, free fluid flow and no air  Patient tolerated the procedure well with no immediate complications.

## 2017-02-24 NOTE — Anesthesia Preprocedure Evaluation (Addendum)
Anesthesia Evaluation  Patient identified by MRN, date of birth, ID band Patient awake    Reviewed: Allergy & Precautions, NPO status , Patient's Chart, lab work & pertinent test results  Airway Mallampati: I  TM Distance: >3 FB Neck ROM: Full    Dental  (+) Edentulous Upper, Poor Dentition, Dental Advisory Given   Pulmonary    Pulmonary exam normal        Cardiovascular hypertension, Pt. on medications + Past MI  Normal cardiovascular exam     Neuro/Psych Seizures -,     GI/Hepatic   Endo/Other    Renal/GU      Musculoskeletal   Abdominal   Peds  Hematology   Anesthesia Other Findings   Reproductive/Obstetrics                           Anesthesia Physical Anesthesia Plan  ASA: III  Anesthesia Plan: General   Post-op Pain Management:    Induction: Intravenous  PONV Risk Score and Plan: 2 and Treatment may vary due to age or medical condition  Airway Management Planned: Oral ETT  Additional Equipment: Arterial line, CVP, PA Cath, TEE and Ultrasound Guidance Line Placement  Intra-op Plan:   Post-operative Plan: Post-operative intubation/ventilation  Informed Consent: I have reviewed the patients History and Physical, chart, labs and discussed the procedure including the risks, benefits and alternatives for the proposed anesthesia with the patient or authorized representative who has indicated his/her understanding and acceptance.     Plan Discussed with: CRNA and Surgeon  Anesthesia Plan Comments:         Anesthesia Quick Evaluation

## 2017-02-24 NOTE — Progress Notes (Signed)
ANTICOAGULATION CONSULT NOTE - Follow Up Consult  Pharmacy Consult for heparin Indication: IABP and Afib  Labs: Recent Labs    02/21/17 0741 02/21/17 1640  02/22/17 0938  02/23/17 0221 02/23/17 1025 02/24/17 0217  HGB  --   --    < > 11.3*  --  11.6*  --  11.4*  HCT  --   --    < > 35.3*  --  36.0*  --  35.2*  PLT  --   --    < > 230  --  200  --  217  APTT  --   --   --   --   --   --  86*  --   LABPROT 12.9  --   --   --   --   --  13.3  --   INR 0.98  --   --   --   --   --  1.02  --   HEPARINUNFRC 0.36  --    < > <0.10*   < > 0.28* 0.51 0.43  CREATININE  --   --    < > 0.69  --  0.68 0.74 0.70  TROPONINI  --  25.46*  --  6.61*  --   --   --   --    < > = values in this interval not displayed.    Assessment: 62yo male undetectable on heparin after started at low rate post cath with IABP placement awaiting CABG plans. Plan now for CABG today, increase heparin drip to goal.  Heparin level this morning at 0200 was therapeutic at 0.43, on 1400 units/hr. CBC stable. No signs/symtoms of bleeding. No infusion issues.    Goal of Therapy:  Heparin level 0.3-0.5 units/ml   Plan:  Heparin infusion stopped for CABG today  Will follow patient post-procedure   Girard CooterKimberly Perkins, PharmD Clinical Pharmacist  Pager: [phone] Clinical Phone for 02/24/2017 until 3:30pm: [phone] If after 3:30pm, please call main pharmacy at [phone] 02/24/2017 7:36 AM

## 2017-02-24 NOTE — Transfer of Care (Signed)
Immediate Anesthesia Transfer of Care Note  Patient: [name] Cruz  Procedure(s) Performed: CORONARY ARTERY BYPASS GRAFTING (CABG) x 3 ON PUMP USING LEFT INTERNAL MAMMARY ARTERY TO LEFT ANTERIOR DESENDING CORNARY ARTERY, RIGHT GREATER SAPHENOUS VEIN TO LEFT CIRCUMFLEX ARTERY AND POSTERIOR DESENDING ARTERY. RIGHT GREATER SAPHENOUS VEIN OBTAINED VIA ENDOVEIN HARVEST. (N/A Chest) TRANSESOPHAGEAL ECHOCARDIOGRAM (TEE) (N/A )  Patient Location: SICU  Anesthesia Type:General  Level of Consciousness: sedated and Patient remains intubated per anesthesia plan  Airway & Oxygen Therapy: Patient remains intubated per anesthesia plan and Patient placed on Ventilator (see vital sign flow sheet for setting)  Post-op Assessment: Report given to RN and Post -op Vital signs reviewed and stable  Post vital signs: Reviewed and stable  Last Vitals:  Vitals:   02/24/17 0700 02/24/17 1331  BP: (!) 111/57 (!) 108/42  Pulse:  76  Resp:  12  Temp:    SpO2:  92%    Last Pain:  Vitals:   02/23/17 1600  TempSrc: Bladder  PainSc: 0-No pain         Complications: No apparent anesthesia complications

## 2017-02-24 NOTE — Brief Op Note (Signed)
      [address]       [place] [id number]             [phone]    02/24/2017  1:17 PM  PATIENT:  [name] Cruz  62 y.o. male  PRE-OPERATIVE DIAGNOSIS:  CAD  POST-OPERATIVE DIAGNOSIS:  cornary artery disease  PROCEDURE:  Procedure(s): CORONARY ARTERY BYPASS GRAFTING (CABG) x 3 ON PUMP USING LEFT INTERNAL MAMMARY ARTERY TO LEFT ANTERIOR DESENDING CORNARY ARTERY, RIGHT GREATER SAPHENOUS VEIN TO LEFT CIRCUMFLEX ARTERY AND POSTERIOR DESENDING ARTERY. RIGHT GREATER SAPHENOUS VEIN OBTAINED VIA ENDOVEIN HARVEST. (N/A) TRANSESOPHAGEAL ECHOCARDIOGRAM (TEE) (N/A)  SVG to Circ SVG to RCA LIMA to LAD   SURGEON:  Surgeon(s) and Role:    * Delight OvensGerhardt, Deriana Vanderhoef B, MD - Primary  PHYSICIAN ASSISTANT:  [name], PA-C   ANESTHESIA:   general  EBL:  900 mL   BLOOD ADMINISTERED:none  DRAINS: ONE STRAIGHT DRAIN AND ONE BLAKE DRAIN   LOCAL MEDICATIONS USED:  NONE  SPECIMEN:  No Specimen  DISPOSITION OF SPECIMEN:  n/a  COUNTS:  YES  DICTATION: .Dragon Dictation  PLAN OF CARE: Admit to inpatient   PATIENT DISPOSITION:  ICU - intubated and hemodynamically stable.   Delay start of Pharmacological VTE agent (>24hrs) due to surgical blood loss or risk of bleeding: yes

## 2017-02-24 NOTE — Anesthesia Procedure Notes (Signed)
Procedure Name: Intubation Date/Time: 02/24/2017 8:00 AM Performed by: Kyung Rudd, CRNA Pre-anesthesia Checklist: Patient identified, Emergency Drugs available, Suction available and Patient being monitored Patient Re-evaluated:Patient Re-evaluated prior to induction Oxygen Delivery Method: Circle system utilized Preoxygenation: Pre-oxygenation with 100% oxygen Induction Type: IV induction Ventilation: Mask ventilation without difficulty and Oral airway inserted - appropriate to patient size Laryngoscope Size: Mac and 4 Grade View: Grade I Tube type: Oral Tube size: 8.0 mm Number of attempts: 1 Airway Equipment and Method: Stylet Placement Confirmation: ETT inserted through vocal cords under direct vision,  positive ETCO2 and breath sounds checked- equal and bilateral Secured at: 20 cm Tube secured with: Tape Dental Injury: Teeth and Oropharynx as per pre-operative assessment

## 2017-02-24 NOTE — Progress Notes (Signed)
  Echocardiogram Echocardiogram Transesophageal has been performed.  Janalyn HarderWest, Dekari Bures R 02/24/2017, 9:28 AM

## 2017-02-24 NOTE — Care Management Note (Addendum)
Case Management Note  Patient Details  Name: [name] Cruz MRN: [id number] Date of Birth: [birth year]  Subjective/Objective:  From home with sister Diane who will be with him 24/7 for assist after d/c,  presents with NSTEMI, afib, acute sys HF, cath on 2/8 shows 3 vessel dz, IABP placed for ongoing chest pain, continued on heparin drip and amio, plan for CABG on 2/11.  POD 1 CABG, conts on milrinone, levophed, dopomine, neo,and chest tube to suction.     2/13 1504 Letha Capeeborah Akshita Italiano RN, BSN - POD 2 CABG on pump, IABP removed yesterday, cxr shows bil pl effusion, 12 pounds fluid overload.  conts on dopamine, neo, amio, lasix. Plan home when stable.  He has PCP and medication coverage.  2/15 1125 Letha Capeeborah Fenix Ruppe RN, BSN- central line dc'd today, he will be going to SDU today, he has ambulated around the unit today.               Action/Plan: NCM will follow for transition of care.  Expected Discharge Date:                  Expected Discharge Plan:     In-House Referral:     Discharge planning Services  CM Consult  Post Acute Care Choice:    Choice offered to:     DME Arranged:    DME Agency:     HH Arranged:    HH Agency:     Status of Service:  In process, will continue to follow  If discussed at Long Length of Stay Meetings, dates discussed:    Additional Comments:  Leone Havenaylor, Lotus Gover Clinton, RN 02/24/2017, 9:11 PM

## 2017-02-24 NOTE — Procedures (Signed)
Extubation Procedure Note  Patient Details:   Name: [name] Cruz DOB: 09/13/1955 MRN: [id number]   Airway Documentation:     Evaluation  O2 sats: stable throughout Complications: No apparent complications Patient did tolerate procedure well. Bilateral Breath Sounds: Rhonchi   Yes   Patient tolerated rapid wean. NIF -40 and VC 1.05 L. Positive for cuff leak. Patient extubated to a 3 Lpm nasal cannula. No signs of dyspnea or stridor. Patient instructed on the Incentive Spirometer achieving 1200 mL five times. RN at bedside. Patient resting comfortably.   [name] Cruz, [name] Cruz 02/24/2017, 6:04 PM

## 2017-02-24 NOTE — Progress Notes (Signed)
Pt. Transported to OR via bed with OR transport team and CRNA.  IABP intact site assessed.  Patients sister sent to main waiting room to obtain pager.

## 2017-02-24 NOTE — Anesthesia Procedure Notes (Signed)
Arterial Line Insertion Start/End2/11/2017 7:45 AM, 02/24/2017 7:50 AM Performed by: Arta Brucessey, Fabyan Loughmiller, MD, anesthesiologist  Patient location: OR. Lidocaine 1% used for infiltration and patient sedated Right, radial was placed Catheter size: 20 G Hand hygiene performed  and maximum sterile barriers used   Attempts: 1 Procedure performed without using ultrasound guided technique. Following insertion, dressing applied and Biopatch. Post procedure assessment: normal  Patient tolerated the procedure well with no immediate complications.

## 2017-02-24 NOTE — Progress Notes (Signed)
Patient ID: [name] Cruz, male   DOB: 1955-07-01, 62 y.o.   MRN: [id number] Pre Procedure note for inpatients:   [name] Cruz has been scheduled for Procedure(s): CORONARY ARTERY BYPASS GRAFTING (CABG) (N/A) TRANSESOPHAGEAL ECHOCARDIOGRAM (TEE) (N/A) today. The various methods of treatment have been discussed with the patient. After consideration of the risks, benefits and treatment options the patient has consented to the planned procedure.   The patient has been seen and labs reviewed. There are no changes in the patient's condition to prevent proceeding with the planned procedure today.  Recent labs:  Lab Results  Component Value Date   WBC 10.3 02/24/2017   HGB 11.4 (L) 02/24/2017   HCT 35.2 (L) 02/24/2017   PLT 217 02/24/2017   GLUCOSE 106 (H) 02/24/2017   CHOL 179 01/28/2017   TRIG 118.0 01/28/2017   HDL 62.50 01/28/2017   LDLDIRECT 122.0 07/22/2016   LDLCALC 92 01/28/2017   ALT 21 02/23/2017   AST 35 02/23/2017   NA 137 02/24/2017   K 4.2 02/24/2017   CL 102 02/24/2017   CREATININE 0.70 02/24/2017   BUN <5 (L) 02/24/2017   CO2 24 02/24/2017   TSH 2.054 02/23/2017   PSA 0.51 01/28/2017   INR 1.02 02/23/2017   HGBA1C 5.5 02/23/2017    Delight OvensEdward B Luanne Krzyzanowski, MD 02/24/2017 7:23 AM

## 2017-02-25 ENCOUNTER — Inpatient Hospital Stay (HOSPITAL_COMMUNITY): Payer: Medicare Other

## 2017-02-25 ENCOUNTER — Encounter (HOSPITAL_COMMUNITY): Payer: Self-pay | Admitting: Cardiothoracic Surgery

## 2017-02-25 DIAGNOSIS — Z951 Presence of aortocoronary bypass graft: Secondary | ICD-10-CM

## 2017-02-25 LAB — POCT I-STAT, CHEM 8
BUN: 4 mg/dL — AB (ref 6–20)
BUN: 4 mg/dL — ABNORMAL LOW (ref 6–20)
CALCIUM ION: 1.2 mmol/L (ref 1.15–1.40)
CREATININE: 0.5 mg/dL — AB (ref 0.61–1.24)
Calcium, Ion: 1.1 mmol/L — ABNORMAL LOW (ref 1.15–1.40)
Chloride: 103 mmol/L (ref 101–111)
Chloride: 96 mmol/L — ABNORMAL LOW (ref 101–111)
Creatinine, Ser: 0.5 mg/dL — ABNORMAL LOW (ref 0.61–1.24)
GLUCOSE: 117 mg/dL — AB (ref 65–99)
GLUCOSE: 130 mg/dL — AB (ref 65–99)
HCT: 25 % — ABNORMAL LOW (ref 39.0–52.0)
HEMATOCRIT: 23 % — AB (ref 39.0–52.0)
HEMOGLOBIN: 7.8 g/dL — AB (ref 13.0–17.0)
Hemoglobin: 8.5 g/dL — ABNORMAL LOW (ref 13.0–17.0)
POTASSIUM: 4.6 mmol/L (ref 3.5–5.1)
POTASSIUM: 4 mmol/L (ref 3.5–5.1)
Sodium: 136 mmol/L (ref 135–145)
Sodium: 135 mmol/L (ref 135–145)
TCO2: 25 mmol/L (ref 22–32)
TCO2: 26 mmol/L (ref 22–32)

## 2017-02-25 LAB — COOXEMETRY PANEL
Carboxyhemoglobin: 1 % (ref 0.5–1.5)
Carboxyhemoglobin: 1.1 % (ref 0.5–1.5)
Methemoglobin: 1.5 % (ref 0.0–1.5)
Methemoglobin: 1.4 % (ref 0.0–1.5)
O2 Saturation: 61.5 %
O2 Saturation: 70.3 %
Total hemoglobin: 8.9 g/dL — ABNORMAL LOW (ref 12.0–16.0)
Total hemoglobin: 11.7 g/dL — ABNORMAL LOW (ref 12.0–16.0)

## 2017-02-25 LAB — CBC
HCT: 23.5 % — ABNORMAL LOW (ref 39.0–52.0)
HCT: 24.1 % — ABNORMAL LOW (ref 39.0–52.0)
HEMOGLOBIN: 7.9 g/dL — AB (ref 13.0–17.0)
Hemoglobin: 8 g/dL — ABNORMAL LOW (ref 13.0–17.0)
MCH: 31.6 pg (ref 26.0–34.0)
MCH: 31.4 pg (ref 26.0–34.0)
MCHC: 33.6 g/dL (ref 30.0–36.0)
MCHC: 33.2 g/dL (ref 30.0–36.0)
MCV: 94 fL (ref 78.0–100.0)
MCV: 94.5 fL (ref 78.0–100.0)
PLATELETS: 137 10*3/uL — AB (ref 150–400)
Platelets: 129 10*3/uL — ABNORMAL LOW (ref 150–400)
RBC: 2.5 MIL/uL — ABNORMAL LOW (ref 4.22–5.81)
RBC: 2.55 MIL/uL — ABNORMAL LOW (ref 4.22–5.81)
RDW: 13.3 % (ref 11.5–15.5)
RDW: 13.4 % (ref 11.5–15.5)
WBC: 8.6 10*3/uL (ref 4.0–10.5)
WBC: 10.6 10*3/uL — ABNORMAL HIGH (ref 4.0–10.5)

## 2017-02-25 LAB — CREATININE, SERUM
CREATININE: 0.6 mg/dL — AB (ref 0.61–1.24)
GFR calc Af Amer: >60 mL/min (ref >60)
GFR calc non Af Amer: >60 mL/min (ref >60)

## 2017-02-25 LAB — MAGNESIUM
MAGNESIUM: 2.1 mg/dL (ref 1.7–2.4)
Magnesium: 1.9 mg/dL (ref 1.7–2.4)

## 2017-02-25 LAB — GLUCOSE, CAPILLARY
GLUCOSE-CAPILLARY: 115 mg/dL — AB (ref 65–99)
GLUCOSE-CAPILLARY: 114 mg/dL — AB (ref 65–99)
GLUCOSE-CAPILLARY: 133 mg/dL — AB (ref 65–99)
Glucose-Capillary: 105 mg/dL — ABNORMAL HIGH (ref 65–99)
Glucose-Capillary: 111 mg/dL — ABNORMAL HIGH (ref 65–99)
Glucose-Capillary: 136 mg/dL — ABNORMAL HIGH (ref 65–99)
Glucose-Capillary: 82 mg/dL (ref 65–99)
Glucose-Capillary: 98 mg/dL (ref 65–99)
Glucose-Capillary: 99 mg/dL (ref 65–99)

## 2017-02-25 LAB — BASIC METABOLIC PANEL
ANION GAP: 9 (ref 5–15)
BUN: <5 mg/dL — ABNORMAL LOW (ref 6–20)
CO2: 21 mmol/L — ABNORMAL LOW (ref 22–32)
Calcium: 7.8 mg/dL — ABNORMAL LOW (ref 8.9–10.3)
Chloride: 105 mmol/L (ref 101–111)
Creatinine, Ser: 0.65 mg/dL (ref 0.61–1.24)
GFR calc Af Amer: >60 mL/min (ref >60)
Glucose, Bld: 110 mg/dL — ABNORMAL HIGH (ref 65–99)
POTASSIUM: 4.5 mmol/L (ref 3.5–5.1)
SODIUM: 135 mmol/L (ref 135–145)

## 2017-02-25 MED ORDER — FUROSEMIDE 10 MG/ML IJ SOLN
40.0000 mg | Freq: Once | INTRAMUSCULAR | Status: AC
  Administered 2017-02-25: 40 mg via INTRAVENOUS

## 2017-02-25 MED ORDER — ORAL CARE MOUTH RINSE
15.0000 mL | Freq: Two times a day (BID) | OROMUCOSAL | Status: DC
  Administered 2017-02-25 – 2017-03-02 (×9): 15 mL via OROMUCOSAL

## 2017-02-25 MED ORDER — INSULIN ASPART 100 UNIT/ML ~~LOC~~ SOLN
0.0000 [IU] | SUBCUTANEOUS | Status: DC
  Administered 2017-02-25 (×2): 2 [IU] via SUBCUTANEOUS

## 2017-02-25 MED FILL — Heparin Sodium (Porcine) Inj 1000 Unit/ML: INTRAMUSCULAR | Qty: 10 | Status: AC

## 2017-02-25 MED FILL — Potassium Chloride Inj 2 mEq/ML: INTRAVENOUS | Qty: 40 | Status: AC

## 2017-02-25 MED FILL — Calcium Chloride Inj 10%: INTRAVENOUS | Qty: 10 | Status: AC

## 2017-02-25 MED FILL — Electrolyte-R (PH 7.4) Solution: INTRAVENOUS | Qty: 4000 | Status: AC

## 2017-02-25 MED FILL — Mannitol IV Soln 20%: INTRAVENOUS | Qty: 500 | Status: AC

## 2017-02-25 MED FILL — Sodium Bicarbonate IV Soln 8.4%: INTRAVENOUS | Qty: 50 | Status: AC

## 2017-02-25 MED FILL — Heparin Sodium (Porcine) Inj 1000 Unit/ML: INTRAMUSCULAR | Qty: 30 | Status: AC

## 2017-02-25 MED FILL — Lidocaine HCl IV Inj 20 MG/ML: INTRAVENOUS | Qty: 5 | Status: AC

## 2017-02-25 MED FILL — Magnesium Sulfate Inj 50%: INTRAMUSCULAR | Qty: 10 | Status: AC

## 2017-02-25 MED FILL — Sodium Chloride IV Soln 0.9%: INTRAVENOUS | Qty: 2000 | Status: AC

## 2017-02-25 NOTE — Anesthesia Postprocedure Evaluation (Signed)
Anesthesia Post Note  Patient: [name] Cruz  Procedure(s) Performed: CORONARY ARTERY BYPASS GRAFTING (CABG) x 3 ON PUMP USING LEFT INTERNAL MAMMARY ARTERY TO LEFT ANTERIOR DESENDING CORNARY ARTERY, RIGHT GREATER SAPHENOUS VEIN TO LEFT CIRCUMFLEX ARTERY AND POSTERIOR DESENDING ARTERY. RIGHT GREATER SAPHENOUS VEIN OBTAINED VIA ENDOVEIN HARVEST. (N/A Chest) TRANSESOPHAGEAL ECHOCARDIOGRAM (TEE) (N/A )     Patient location during evaluation: SICU Anesthesia Type: General Level of consciousness: sedated Pain management: pain level controlled Vital Signs Assessment: post-procedure vital signs reviewed and stable Respiratory status: patient remains intubated per anesthesia plan Cardiovascular status: stable Postop Assessment: no apparent nausea or vomiting Anesthetic complications: no    Last Vitals:  Vitals:   02/25/17 1545 02/25/17 1600  BP:  100/64  Pulse: (!) 42 (!) 41  Resp: (!) 22 15  Temp: 37.2 C 37.3 C  SpO2: 95% 97%    Last Pain:  Vitals:   02/25/17 0800  TempSrc: Core  PainSc: 3                  Hafiz Irion DAVID

## 2017-02-25 NOTE — Progress Notes (Signed)
Advanced Heart Failure Rounding Note  PCP-Cardiologist: No primary care provider on file.   Subjective:    POD # 1 CABG x 3 (LiMA to LAD, R Saphenous to LCx and PDA)  Extubated last night. Remains on IABP.   Coox 61.5% on Milrinone 0.15, Dopamine 5, and Neo 30  Awake and alert this am. C/o of hand pain, but IV site taped over. Nurse removes and pain immediately resolves.  Pain control has been an issue over night.    UOP ~ 100 cc/hr over night. Pressures lower this am and UOP dropped to ~ 30 cc/hr. Denies SOB.  Up 20 lbs from pre-op weight by bed scale. Thus far auto-diuresing well.   IABP stable at 1:1.     Swan CVP 14 PA 34/14 (23) CO 5.1      -->  6.75  (Improved with increased pressors)  CI 2.9        -->  3.81  LHC 02/21/17 1. Severe 3-vessel coronary artery disease, including 60% LMCA stenosis, chronic total occlusion of ostial LAD, 70% proximal LCx lesion, and sequential 70-99% proximal, mid, and distal RCA lesions. Mid and distal LAD fill via right-to-left collaterals. 2. Severely reduced left ventricular contraction (LVEF 25-30%). 3. Mildly elevated left ventricular filling pressure (LVEDP 20 mmHg). 4. Successful placement of 40 mL intraaortic balloon pump via the right common femoral artery.  Echo 02/21/17 LVEF 45-50%  Objective:   Weight Range: 188 lb 11.4 oz (85.6 kg) Body mass index is 34.52 kg/m.   Vital Signs:   Temp:  [95.9 F (35.5 C)-99.3 F (37.4 C)] 98.8 F (37.1 C) (02/12 0700) Pulse Rate:  [46-192] 88 (02/12 0700) Resp:  [11-28] 15 (02/12 0700) BP: (74-108)/(41-64) 85/57 (02/12 0700) SpO2:  [74 %-100 %] 99 % (02/12 0700) Arterial Line BP: (64-244)/(32-241) 81/41 (02/12 0700) FiO2 (%):  [40 %-70 %] 40 % (02/11 1720) Weight:  [188 lb 11.4 oz (85.6 kg)] 188 lb 11.4 oz (85.6 kg) (02/12 0429) Last BM Date: 02/19/17  Weight change: Filed Weights   02/21/17 0000 02/24/17 0400 02/25/17 0429  Weight: 172 lb 6.4 oz (78.2 kg) 168 lb 6.9 oz (76.4 kg)  188 lb 11.4 oz (85.6 kg)    Intake/Output:   Intake/Output Summary (Last 24 hours) at 02/25/2017 1610 Last data filed at 02/25/2017 0700 Gross per 24 hour  Intake 7732.77 ml  Output 4750 ml  Net 2982.77 ml      Physical Exam    General:  Lying in bed . No resp difficulty HEENT: Normal Neck: Supple. JVP up. RIJ swan. Carotids 2+ bilat; no bruits. No lymphadenopathy or thyromegaly appreciated. Cor: PMI nondisplaced. Regular rate & rhythm. No rubs, gallops or murmurs. Sternal dressing C/D/I. RIJ swan Lungs: Dependent crackles Abdomen: Soft, nontender, nondistended. No hepatosplenomegaly. No bruits or masses. Good bowel sounds. Extremities: No cyanosis, clubbing, or rash. SCDs in place. Trace ankle edema  IABP in RFA Neuro: Alert & orientedx3, cranial nerves grossly intact. moves all 4 extremities w/o difficulty. Affect pleasant  Telemetry   A paced at 90, personally reviewed  EKG    No new tracings.    Labs    CBC Recent Labs    02/23/17 0221 02/24/17 0217  02/24/17 1936 02/25/17 0303  WBC 10.3 10.3   < > 11.4* 8.6  NEUTROABS 6.5 6.4  --   --   --   HGB 11.6* 11.4*   < > 8.6* 7.9*  HCT 36.0* 35.2*   < > 26.4*  23.5*  MCV 96.3 96.2   < > 94.6 94.0  PLT 200 217   < > 154 129*   < > = values in this interval not displayed.   Basic Metabolic Panel Recent Labs    16/10/96 0217  02/24/17 1216 02/24/17 1346 02/24/17 1936 02/25/17 0303  NA 137   < > 139 140  --  135  K 4.2   < > 4.1 3.7  --  4.5  CL 102   < > 102  --   --  105  CO2 24  --   --   --   --  21*  GLUCOSE 106*   < > 161* 131*  --  110*  BUN <5*   < > 4*  --   --  <5*  CREATININE 0.70   < > 0.40*  --  0.63 0.65  CALCIUM 8.6*  --   --   --   --  7.8*  MG  --   --   --   --  2.6* 2.1   < > = values in this interval not displayed.   Liver Function Tests Recent Labs    02/23/17 0221 02/23/17 1025  AST 38 35  ALT 20 21  ALKPHOS 53 55  BILITOT 0.5 0.5  PROT 5.8* 6.1*  ALBUMIN 3.1* 3.2*   No  results for input(s): LIPASE, AMYLASE in the last 72 hours. Cardiac Enzymes Recent Labs    02/22/17 0938  TROPONINI 6.61*    BNP: BNP (last 3 results) No results for input(s): BNP in the last 8760 hours.  ProBNP (last 3 results) No results for input(s): PROBNP in the last 8760 hours.   D-Dimer No results for input(s): DDIMER in the last 72 hours. Hemoglobin A1C Recent Labs    02/23/17 1025  HGBA1C 5.5   Fasting Lipid Panel No results for input(s): CHOL, HDL, LDLCALC, TRIG, CHOLHDL, LDLDIRECT in the last 72 hours. Thyroid Function Tests Recent Labs    02/23/17 0221  TSH 2.054   Other results:  Imaging   Dg Chest Port 1 View  Result Date: 02/24/2017 CLINICAL DATA:  Intra aortic balloon pump. Patient has undergone CABG today. EXAM: PORTABLE CHEST 1 VIEW COMPARISON:  Portable chest x-ray of February 22, 2017 FINDINGS: The lungs are mildly hypoinflated. There is no pneumothorax or large pleural effusion. The left chest tube tip projects over the posterior aspect of the left fifth rib. There is minimal left basilar atelectasis. The cardiac silhouette is mildly enlarged but stable. The Swan-Ganz catheter tip appears to project in a proximal left main pulmonary artery branch. The intra-aortic balloon pump marker lies just inferior to the aortic arch. The feeding tube tip lies above the GE junction. The endotracheal tube tip projects approximately 3 cm above the carina. IMPRESSION: Post CABG changes. Minimal left basilar atelectasis, mild bilateral hypoinflation. No pneumothorax or large pleural effusion. Mild cardiomegaly without pulmonary vascular congestion or pulmonary edema. Advancement of the esophagogastric tube by 10 cm is recommended to assure that the proximal port is positioned below the GE junction. The other support tubes are in reasonable position. The intra aortic balloon pump radiodense marker peer stable at the origin of the descending thoracic aorta. Electronically  Signed   By: David  [place] M.D.   On: 02/24/2017 14:46     Medications:    Scheduled Medications: . acetaminophen  1,000 mg Oral Q6H   Or  . acetaminophen (TYLENOL) oral liquid 160 mg/5 mL  1,000 mg Per Tube Q6H  . aspirin EC  325 mg Oral Daily   Or  . aspirin  324 mg Per Tube Daily  . bisacodyl  10 mg Oral Daily   Or  . bisacodyl  10 mg Rectal Daily  . Chlorhexidine Gluconate Cloth  6 each Topical Daily  . docusate sodium  200 mg Oral Daily  . insulin aspart  0-24 Units Subcutaneous Q4H  . mouth rinse  15 mL Mouth Rinse BID  . metoprolol tartrate  12.5 mg Oral BID   Or  . metoprolol tartrate  12.5 mg Per Tube BID  . [START ON 02/26/2017] pantoprazole  40 mg Oral Daily  . sodium chloride flush  10-40 mL Intracatheter Q12H  . sodium chloride flush  3 mL Intravenous Q12H     Infusions: . sodium chloride 20 mL/hr at 02/24/17 2000  . sodium chloride    . sodium chloride 20 mL/hr at 02/24/17 1400  . amiodarone 30 mg/hr (02/25/17 1610)  . cefUROXime (ZINACEF)  IV 1.5 g (02/24/17 2145)  . dexmedetomidine (PRECEDEX) IV infusion 0.2 mcg/kg/hr (02/25/17 0320)  . DOPamine 5 mcg/kg/min (02/24/17 2000)  . famotidine (PEPCID) IV Stopped (02/24/17 1405)  . lactated ringers    . lactated ringers    . lactated ringers 20 mL/hr at 02/24/17 2000  . milrinone 0.15 mcg/kg/min (02/24/17 2307)  . nitroGLYCERIN Stopped (02/24/17 1330)  . phenylephrine (NEO-SYNEPHRINE) Adult infusion 20 mcg/min (02/25/17 0639)     PRN Medications:  sodium chloride, lactated ringers, metoprolol tartrate, midazolam, morphine injection, ondansetron (ZOFRAN) IV, oxyCODONE, sodium chloride flush, sodium chloride flush, traMADol    Patient Profile   [name] Cruz is a 62 year old with a history of hemorrhagic stroke, htn, memory impairment.   Admitted to [facility]/17 with chest pain and A fib RVR. Had LHC with multivessel CAD. IABP left in place and planned for CABG  Assessment/Plan   1. CAD -> NSTEMI - s/p  CABG 02/24/17 x 3  (LiMA to LAD, R Saphenous to LCx and PDA) - Extubated day of surgery.  - Weaning pressors as tolerated, Remains on IABP 1:1 as below.   2. Acute systolic CHF due to ICM - Echo pre op 45-50%, though ? Accuracy as LV gram 25-30%.  - Volume status elevated on exam. Up 20 lbs from pre-op by bed scale. CVP 14 - Auto diuresed well overnight. Will follow pressures today and consider low dose lasix this evening.  - Remains on IABP - Coox 61.5% on Milrinone 0.15, Dopamine 5, and Neo 30 - Wean neo as tolerated.  - Hold off on lopressor for now  3. Afib RVR - Remains on amio gtt. Would continue while on pressors.  - A paced so difficult to tell underlying rhythm currently.  - CHA2DS2-VASc Score is at least 5. (CHF, HTN, CVD, Stroke)  4. HTN - Will adjust meds as pressors come off.   5. H/o Hemorrhagic Stroke - ? Due to uncontrolled HTN in 2007 per PCP notes.  Medication concerns reviewed with patient and pharmacy team. Barriers identified: None at this time.   Length of Stay: 4  [name] Cruz  02/25/2017, 7:22 AM  Advanced Heart Failure Team Pager [phone] (M-F; 7a - 4p)  Please contact [facility] for night-coverage after hours (4p -7a ) and weekends on [email or web address]  Patient seen and examined with the above-signed Advanced Practice Provider and/or Housestaff. I personally reviewed laboratory data, imaging studies and relevant notes. I independently examined the  patient and formulated the important aspects of the plan. I have edited the note to reflect any of my changes or salient points. I have personally discussed the plan with the patient and/or family.  He is stable. POD #1. Swan numbers look good. Weaning inotropes per TCTS. Waveforms on IABP look good. Will wean 1:2 and try to pull later. Volume status up. Start lasix 40IV daily. Remains a-paced continue amio.   [name] Meresaniel Vada Swift, MD  3:27 PM

## 2017-02-25 NOTE — Progress Notes (Signed)
IABP changed to 1:2 and Milrinone gtt stopped per Dr. Tyrone SageGerhardt.  Co ox to be obtained this afternoon.  VSS will continue to monitor.

## 2017-02-25 NOTE — Op Note (Signed)
NAME:  [name] Cruz, [name]                     ACCOUNT NO.:  MEDICAL RECORD NO.:  [id number]  LOCATION:                                 FACILITY:  PHYSICIAN:  Sheliah PlaneEdward Antwaine Boomhower, MD         DATE OF BIRTH:  DATE OF PROCEDURE:  02/24/2017 DATE OF DISCHARGE:                              OPERATIVE REPORT   PREOPERATIVE DIAGNOSES:  Coronary occlusive disease with recent non-ST- elevation myocardial infarction, intraaortic balloon pump in place.  POSTOPERATIVE DIAGNOSES:  Coronary occlusive disease with recent non-ST- elevation myocardial infarction, intraaortic balloon pump in place.  SURGICAL PROCEDURE:  Coronary artery bypass grafting x3 with the left internal mammary to the left anterior descending coronary artery, reverse saphenous vein graft to the distal right coronary artery, reverse saphenous vein graft to the circumflex coronary artery with right greater saphenous endoscopic vein harvesting from the thigh and upper calf.  SURGEON:  Sheliah PlaneEdward Stanley Helmuth, MD.  FIRST ASSISTANT:  [name] Cruz, [name].  BRIEF HISTORY:  The patient is a 62 year old male who 20 years previously had a stroke leaving some left-sided weakness and dysarthria. The patient has been very functional following his stroke.  In the evening of February 7th, the [facility] with new-onset of chest pain.  Troponin was mildly elevated.  He was kept in the emergency room overnight.  The following morning, when his troponins went to 20, it was decided by Cardiology that he needed cardiac catheterization.  This was done by Dr. Okey Cruz.  At the time of catheterization, intraaortic balloon pump was placed.  Cardiac catheterization revealed chronically occluded LAD with collateral filling off an acute marginal branch.  He had high-grade stenosis of the proximal right coronary artery after the takeoff of the acute marginal. In addition, the circumflex coronary artery had 80% stenosis.  Overall, ventricular  function appeared preserved.  The patient stabilized without further chest pain, shortness of breath, or evidence of congestive heart failure.  When he originally presented, he was in SVT and had been started on a Cardizem drip.  This was changed to amiodarone drip.  In addition after the patient had his cardiac catheterization and balloon pump placed, he developed urinary retention and when he arrived in the Cardiac Intensive Care Unit, he had 2 L of urine in his bladder, Foley was placed and left.  After reviewing with the patient and his family the risks and options of coronary artery bypass grafting, the patient was agreeable with proceeding.  PROCEDURE IN DETAIL:  With Swan-Ganz and arterial line monitors in place, the patient underwent general endotracheal anesthesia without incident.  A TEE probe was placed by Dr. Michelle Cruz.  This showed very slight anterior wall hypokinesis; otherwise, preserved LV function, trace mitral regurgitation, and no intraatrial clot.  After appropriate time- out, we then proceeded with endoscopic vein harvesting of the right greater saphenous vein from the thigh and calf.  The vein was of good quality and caliber.  Median sternotomy was performed.  Left internal mammary artery was dissected as a pedicle graft.  The distal artery was divided and had good free flow.  The vessel was hydrostatically  dilated with heparinized saline.  The pericardium was opened.  The patient did have evidence of left ventricular hypertrophy.  He was systemically heparinized.  Ascending aorta was cannulated.  The right atrium was cannulated.  An aortic root vent cardioplegia needle was introduced into the ascending aorta.  The patient was placed on cardiopulmonary bypass 2.4 L/min/m2.  Sites of anastomosis were selected and dissected out of the epicardium.  The patient's body temperature was cooled to 32 degrees.  Aortic crossclamp was applied.  A 500 mL of cold blood potassium  cardioplegia was administered with diastolic arrest of the heart.  Myocardial septal temperature was monitored throughout the cross- clamp.  Attention was turned first to the distal right coronary artery, which was opened and was a relatively large vessel, easily admitting a 2 mm probe.  Using a running 7-0 Prolene, distal anastomosis was performed.  Additional cold blood cardioplegia was administered down the vein graft.  The heart was then elevated and the circumflex coronary artery was opened.  This vessel was slightly smaller, but admitted a 1.5 mm probe.  Using a running 7-0 Prolene, a distal anastomosis was performed.  Attention was then turned to the left anterior descending coronary artery.  This vessel was relatively small for LAD, the vessel was opened, admitted a 1 mm probe distally, and 1.5 mm probe proximally. Using a running 8-0 Prolene, left internal mammary artery was anastomosed to the left anterior descending coronary artery.  With the crossclamp still in place, 2 punch aortotomies were performed and each of the 2 vein grafts were anastomosed to the ascending aorta.  The bulldog on the mammary artery was removed with prompt rise in myocardial septal temperature.  The heart was allowed to passively fill and de-air and the proximal anastomoses were completed.  The cross-clamp was removed.  Total cross-clamp time was 65 minutes.  The patient spontaneously converted to a sinus rhythm.  Atrial and ventricular pacing wires were applied.  He had been started on low-dose milrinone and dopamine.  With the body temperature rewarmed to 37 degrees, he was then ventilated and weaned from cardiopulmonary bypass.  TEE showed good LV function, trace mitral regurgitation.  Intraaortic balloon pump was resumed and the patient was decannulated in usual fashion.  Protamine sulfate was administered with operative field hemostatic and a left pleural tube and a Blake mediastinal drain were left  in place.  The sternum was closed #6 stainless steel wire, fascia closed with interrupted 0 Vicryl and 3-0 Vicryl subcutaneous tissue, 4-0 subcuticular stitch in skin edges.  Dry dressings were applied.  Sponge and needle count was reported as correct at the completion of procedure. The patient tolerated the procedure without obvious complication and was transferred to Surgical Intensive Care Unit for further postoperative care.  The patient did not require any blood bank blood products during the operative procedure.  Total pump time was 94 minutes.     Sheliah Plane, MD     EG/MEDQ  D:  02/25/2017  T:  02/25/2017  Job:  [id number]

## 2017-02-25 NOTE — Progress Notes (Signed)
Patient ID: [name] Cruz, male   DOB: 07/19/1955, 62 y.o.   MRN: [id number] TCTS DAILY ICU PROGRESS NOTE                   [address]            [place] [id number]          [phone]   1 Day Post-Op Procedure(s) (LRB): CORONARY ARTERY BYPASS GRAFTING (CABG) x 3 ON PUMP USING LEFT INTERNAL MAMMARY ARTERY TO LEFT ANTERIOR DESENDING CORNARY ARTERY, RIGHT GREATER SAPHENOUS VEIN TO LEFT CIRCUMFLEX ARTERY AND POSTERIOR DESENDING ARTERY. RIGHT GREATER SAPHENOUS VEIN OBTAINED VIA ENDOVEIN HARVEST. (N/A) TRANSESOPHAGEAL ECHOCARDIOGRAM (TEE) (N/A)  Total Length of Stay:  LOS: 4 days   Subjective: Extubated last p.m. awake and alert neurologically intact  Objective: Vital signs in last 24 hours: Temp:  [95.9 F (35.5 C)-99.3 F (37.4 C)] 98.8 F (37.1 C) (02/12 0800) Pulse Rate:  [46-192] 89 (02/12 0800) Cardiac Rhythm: Atrial paced (02/12 0800) Resp:  [11-28] 16 (02/12 0800) BP: (74-108)/(41-64) 90/62 (02/12 0800) SpO2:  [74 %-100 %] 95 % (02/12 0800) Arterial Line BP: (64-244)/(32-241) 107/45 (02/12 0800) FiO2 (%):  [40 %-70 %] 40 % (02/11 1720) Weight:  [188 lb 11.4 oz (85.6 kg)] 188 lb 11.4 oz (85.6 kg) (02/12 0429)  Filed Weights   02/21/17 0000 02/24/17 0400 02/25/17 0429  Weight: 172 lb 6.4 oz (78.2 kg) 168 lb 6.9 oz (76.4 kg) 188 lb 11.4 oz (85.6 kg)    Weight change: 20 lb 4.5 oz (9.2 kg)   Hemodynamic parameters for last 24 hours: PAP: (27-45)/(10-22) 42/19 CVP:  [14 mmHg] 14 mmHg CO:  [5.1 L/min-6.9 L/min] 6.8 L/min CI:  [2.9 L/min/m2-3.9 L/min/m2] 3.8 L/min/m2  Intake/Output from previous day: 02/11 0701 - 02/12 0700 In: 7732.8 [P.O.:480; I.V.:4802.8; Blood:350; IV Piggyback:2100] Out: 4750 [Urine:3265; Blood:900; Chest Tube:585]  Intake/Output this shift: Total I/O In: 20 [I.V.:20] Out: 70 [Urine:50; Chest Tube:20]  Current Meds: Scheduled Meds: . acetaminophen  1,000 mg Oral Q6H   Or  . acetaminophen (TYLENOL) oral liquid 160 mg/5 mL   1,000 mg Per Tube Q6H  . aspirin EC  325 mg Oral Daily   Or  . aspirin  324 mg Per Tube Daily  . bisacodyl  10 mg Oral Daily   Or  . bisacodyl  10 mg Rectal Daily  . Chlorhexidine Gluconate Cloth  6 each Topical Daily  . docusate sodium  200 mg Oral Daily  . insulin aspart  0-24 Units Subcutaneous Q4H  . mouth rinse  15 mL Mouth Rinse BID  . [START ON 02/26/2017] pantoprazole  40 mg Oral Daily  . sodium chloride flush  10-40 mL Intracatheter Q12H  . sodium chloride flush  3 mL Intravenous Q12H   Continuous Infusions: . sodium chloride 20 mL/hr at 02/25/17 0800  . sodium chloride    . sodium chloride 20 mL/hr at 02/24/17 1400  . amiodarone 30 mg/hr (02/25/17 [id number])  . cefUROXime (ZINACEF)  IV 1.5 g (02/24/17 2145)  . dexmedetomidine (PRECEDEX) IV infusion 0.2 mcg/kg/hr (02/25/17 0320)  . DOPamine 5 mcg/kg/min (02/24/17 2000)  . famotidine (PEPCID) IV Stopped (02/24/17 1405)  . lactated ringers    . lactated ringers    . lactated ringers 20 mL/hr at 02/24/17 2000  . milrinone 0.15 mcg/kg/min (02/24/17 2307)  . nitroGLYCERIN Stopped (02/24/17 1330)  . phenylephrine (NEO-SYNEPHRINE) Adult infusion 20 mcg/min (02/25/17 0639)   PRN Meds:.sodium chloride, lactated ringers, metoprolol tartrate,  midazolam, morphine injection, ondansetron (ZOFRAN) IV, oxyCODONE, sodium chloride flush, sodium chloride flush, traMADol  General appearance: alert and cooperative Neurologic: intact Feet viable with palpable pulses Lab Results: CBC: Recent Labs    02/24/17 1936 02/25/17 0303  WBC 11.4* 8.6  HGB 8.6* 7.9*  HCT 26.4* 23.5*  PLT 154 129*   BMET:  Recent Labs    02/24/17 0217  02/24/17 1216 02/24/17 1346 02/24/17 1936 02/25/17 0303  NA 137   < > 139 140  --  135  K 4.2   < > 4.1 3.7  --  4.5  CL 102   < > 102  --   --  105  CO2 24  --   --   --   --  21*  GLUCOSE 106*   < > 161* 131*  --  110*  BUN <5*   < > 4*  --   --  <5*  CREATININE 0.70   < > 0.40*  --  0.63 0.65    CALCIUM 8.6*  --   --   --   --  7.8*   < > = values in this interval not displayed.    CMET: Lab Results  Component Value Date   WBC 8.6 02/25/2017   HGB 7.9 (L) 02/25/2017   HCT 23.5 (L) 02/25/2017   PLT 129 (L) 02/25/2017   GLUCOSE 110 (H) 02/25/2017   CHOL 179 01/28/2017   TRIG 118.0 01/28/2017   HDL 62.50 01/28/2017   LDLDIRECT 122.0 07/22/2016   LDLCALC 92 01/28/2017   ALT 21 02/23/2017   AST 35 02/23/2017   NA 135 02/25/2017   K 4.5 02/25/2017   CL 105 02/25/2017   CREATININE 0.65 02/25/2017   BUN <5 (L) 02/25/2017   CO2 21 (L) 02/25/2017   TSH 2.054 02/23/2017   PSA 0.51 01/28/2017   INR 1.32 02/24/2017   HGBA1C 5.5 02/23/2017      PT/INR:  Recent Labs    02/24/17 1400  LABPROT 16.3*  INR 1.32   Radiology: Dg Chest Port 1 View  Result Date: 02/24/2017 CLINICAL DATA:  Intra aortic balloon pump. Patient has undergone CABG today. EXAM: PORTABLE CHEST 1 VIEW COMPARISON:  Portable chest x-ray of February 22, 2017 FINDINGS: The lungs are mildly hypoinflated. There is no pneumothorax or large pleural effusion. The left chest tube tip projects over the posterior aspect of the left fifth rib. There is minimal left basilar atelectasis. The cardiac silhouette is mildly enlarged but stable. The Swan-Ganz catheter tip appears to project in a proximal left main pulmonary artery branch. The intra-aortic balloon pump marker lies just inferior to the aortic arch. The feeding tube tip lies above the GE junction. The endotracheal tube tip projects approximately 3 cm above the carina. IMPRESSION: Post CABG changes. Minimal left basilar atelectasis, mild bilateral hypoinflation. No pneumothorax or large pleural effusion. Mild cardiomegaly without pulmonary vascular congestion or pulmonary edema. Advancement of the esophagogastric tube by 10 cm is recommended to assure that the proximal port is positioned below the GE junction. The other support tubes are in reasonable position. The intra  aortic balloon pump radiodense marker peer stable at the origin of the descending thoracic aorta. Electronically Signed   By: David  [place] M.D.   On: 02/24/2017 14:46     Assessment/Plan: S/P Procedure(s) (LRB): CORONARY ARTERY BYPASS GRAFTING (CABG) x 3 ON PUMP USING LEFT INTERNAL MAMMARY ARTERY TO LEFT ANTERIOR DESENDING CORNARY ARTERY, RIGHT GREATER SAPHENOUS VEIN TO LEFT CIRCUMFLEX ARTERY  AND POSTERIOR DESENDING ARTERY. RIGHT GREATER SAPHENOUS VEIN OBTAINED VIA ENDOVEIN HARVEST. (N/A) TRANSESOPHAGEAL ECHOCARDIOGRAM (TEE) (N/A) Diabetes control Decreased milrinone Decreased intra-aortic balloon pump 1-2 Expected Acute  Blood - loss Anemia- continue to monitor      Delight Ovens 02/25/2017 8:13 AM

## 2017-02-25 NOTE — Progress Notes (Signed)
Patient ID: [name], male   DOB: [birth year], 62 y.o.   MRN: [id number] EVENING ROUNDS NOTE :     [address]       [place] [id number]             [phone]                 1 Day Post-Op Procedure(s) (LRB): CORONARY ARTERY BYPASS GRAFTING (CABG) x 3 ON PUMP USING LEFT INTERNAL MAMMARY ARTERY TO LEFT ANTERIOR DESENDING CORNARY ARTERY, RIGHT GREATER SAPHENOUS VEIN TO LEFT CIRCUMFLEX ARTERY AND POSTERIOR DESENDING ARTERY. RIGHT GREATER SAPHENOUS VEIN OBTAINED VIA ENDOVEIN HARVEST. (N/A) TRANSESOPHAGEAL ECHOCARDIOGRAM (TEE) (N/A)  Total Length of Stay:  LOS: 4 days  BP (!) 85/62   Pulse 86   Temp 99.3 F (37.4 C)   Resp 20   Ht 5\' 2"  (1.575 m)   Wt 188 lb 11.4 oz (85.6 kg)   SpO2 (!) 64%   BMI 34.52 kg/m   .Intake/Output      02/12 0701 - 02/13 0700   P.O.    I.V. (mL/kg) 1049.1 (12.3)   Blood    IV Piggyback 100   Total Intake(mL/kg) 1149.1 (13.4)   Urine (mL/kg/hr) 2350 (2.2)   Blood    Chest Tube 240   Total Output 2590   Net -1440.9         . sodium chloride 20 mL/hr at 02/25/17 0800  . sodium chloride    . sodium chloride 20 mL/hr at 02/24/17 1400  . amiodarone 30 mg/hr (02/25/17 1754)  . cefUROXime (ZINACEF)  IV Stopped (02/25/17 1110)  . dexmedetomidine (PRECEDEX) IV infusion Stopped (02/25/17 0745)  . DOPamine 5 mcg/kg/min (02/25/17 0800)  . lactated ringers    . lactated ringers    . lactated ringers 20 mL/hr at 02/25/17 0800  . milrinone Stopped (02/25/17 0820)  . nitroGLYCERIN Stopped (02/24/17 1330)  . phenylephrine (NEO-SYNEPHRINE) Adult infusion 35 mcg/min (02/25/17 1448)     Lab Results  Component Value Date   WBC 10.6 (H) 02/25/2017   HGB 7.8 (L) 02/25/2017   HCT 23.0 (L) 02/25/2017   PLT 137 (L) 02/25/2017   GLUCOSE 130 (H) 02/25/2017   CHOL 179 01/28/2017   TRIG 118.0 01/28/2017   HDL 62.50 01/28/2017   LDLDIRECT 122.0 07/22/2016   LDLCALC 92 01/28/2017   ALT 21 02/23/2017   AST 35 02/23/2017   NA 135  02/25/2017   K 4.0 02/25/2017   CL 96 (L) 02/25/2017   CREATININE 0.50 (L) 02/25/2017   BUN 4 (L) 02/25/2017   CO2 21 (L) 02/25/2017   TSH 2.054 02/23/2017   PSA 0.51 01/28/2017   INR 1.32 02/24/2017   HGBA1C 5.5 02/23/2017   Stable day D/c chest tubes  Try to d/c iab in am   Delight OvensEdward B Caydence Enck MD  Beeper [phone] Office [phone] 02/25/2017 7:15 PM

## 2017-02-26 ENCOUNTER — Other Ambulatory Visit: Payer: Self-pay

## 2017-02-26 ENCOUNTER — Inpatient Hospital Stay (HOSPITAL_COMMUNITY): Payer: Medicare Other

## 2017-02-26 LAB — CBC
HCT: 23 % — ABNORMAL LOW (ref 39.0–52.0)
Hemoglobin: 7.5 g/dL — ABNORMAL LOW (ref 13.0–17.0)
MCH: 30.9 pg (ref 26.0–34.0)
MCHC: 32.6 g/dL (ref 30.0–36.0)
MCV: 94.7 fL (ref 78.0–100.0)
Platelets: 121 10*3/uL — ABNORMAL LOW (ref 150–400)
RBC: 2.43 MIL/uL — ABNORMAL LOW (ref 4.22–5.81)
RDW: 13.4 % (ref 11.5–15.5)
WBC: 9 10*3/uL (ref 4.0–10.5)

## 2017-02-26 LAB — BASIC METABOLIC PANEL
ANION GAP: 10 (ref 5–15)
BUN: 5 mg/dL — ABNORMAL LOW (ref 6–20)
CALCIUM: 8 mg/dL — AB (ref 8.9–10.3)
CO2: 25 mmol/L (ref 22–32)
Chloride: 100 mmol/L — ABNORMAL LOW (ref 101–111)
Creatinine, Ser: 0.59 mg/dL — ABNORMAL LOW (ref 0.61–1.24)
GFR calc Af Amer: >60 mL/min (ref >60)
GFR calc non Af Amer: >60 mL/min (ref >60)
GLUCOSE: 110 mg/dL — AB (ref 65–99)
POTASSIUM: 4.1 mmol/L (ref 3.5–5.1)
Sodium: 135 mmol/L (ref 135–145)

## 2017-02-26 LAB — GLUCOSE, CAPILLARY
GLUCOSE-CAPILLARY: 108 mg/dL — AB (ref 65–99)
GLUCOSE-CAPILLARY: 89 mg/dL (ref 65–99)
Glucose-Capillary: 107 mg/dL — ABNORMAL HIGH (ref 65–99)
Glucose-Capillary: 118 mg/dL — ABNORMAL HIGH (ref 65–99)
Glucose-Capillary: 67 mg/dL (ref 65–99)
Glucose-Capillary: 91 mg/dL (ref 65–99)

## 2017-02-26 LAB — COOXEMETRY PANEL
Carboxyhemoglobin: 1 % (ref 0.5–1.5)
Carboxyhemoglobin: 1.2 % (ref 0.5–1.5)
Methemoglobin: 1.6 % — ABNORMAL HIGH (ref 0.0–1.5)
Methemoglobin: 1 % (ref 0.0–1.5)
O2 Saturation: 59.4 %
O2 Saturation: 58.4 %
Total hemoglobin: 8.5 g/dL — ABNORMAL LOW (ref 12.0–16.0)
Total hemoglobin: 11.7 g/dL — ABNORMAL LOW (ref 12.0–16.0)

## 2017-02-26 LAB — POCT ACTIVATED CLOTTING TIME: Activated Clotting Time: 136 seconds

## 2017-02-26 MED ORDER — FUROSEMIDE 10 MG/ML IJ SOLN
40.0000 mg | Freq: Once | INTRAMUSCULAR | Status: AC
  Administered 2017-02-26: 40 mg via INTRAVENOUS
  Filled 2017-02-26: qty 4

## 2017-02-26 NOTE — Progress Notes (Signed)
Advanced Heart Failure Rounding Note  PCP-Cardiologist: No primary care provider on file.   Subjective:    POD # 2 CABG x 3 (LiMA to LAD, R Saphenous to LCx and PDA)  Extubated 02/24/17 (Evening after operation).   Coox 59.4% on Dopamine 5 and Neo 15.   Feeling OK this am. Denies SOB. Pain well controlled.   Down 4 lbs with IV lasix 40 mg x 1. Up 20 lbs from pre-op weight by bed scale. Thus far auto-diuresing well.   IABP at 1:3 with stable waveform.  Repeat Coox pending.   Ernestine Conrad numbers personally reviewed. CVP 10 PA 31/15 (23) CO 7.5 CI 4.2  LHC 02/21/17 1. Severe 3-vessel coronary artery disease, including 60% LMCA stenosis, chronic total occlusion of ostial LAD, 70% proximal LCx lesion, and sequential 70-99% proximal, mid, and distal RCA lesions. Mid and distal LAD fill via right-to-left collaterals. 2. Severely reduced left ventricular contraction (LVEF 25-30%). 3. Mildly elevated left ventricular filling pressure (LVEDP 20 mmHg). 4. Successful placement of 40 mL intraaortic balloon pump via the right common femoral artery.  Echo 02/21/17 LVEF 45-50%  Objective:   Weight Range: 184 lb 8.4 oz (83.7 kg) Body mass index is 33.75 kg/m.   Vital Signs:   Temp:  [98.2 F (36.8 C)-100.2 F (37.9 C)] 98.6 F (37 C) (02/13 0700) Pulse Rate:  [25-147] 79 (02/13 0700) Resp:  [9-29] 12 (02/13 0700) BP: (85-122)/(52-85) 91/53 (02/13 0700) SpO2:  [64 %-100 %] 99 % (02/13 0700) Arterial Line BP: (81-131)/(42-68) 90/46 (02/13 0700) Weight:  [184 lb 8.4 oz (83.7 kg)] 184 lb 8.4 oz (83.7 kg) (02/13 0645) Last BM Date: 02/24/17  Weight change: Filed Weights   02/24/17 0400 02/25/17 0429 02/26/17 0645  Weight: 168 lb 6.9 oz (76.4 kg) 188 lb 11.4 oz (85.6 kg) 184 lb 8.4 oz (83.7 kg)    Intake/Output:   Intake/Output Summary (Last 24 hours) at 02/26/2017 0754 Last data filed at 02/26/2017 0600 Gross per 24 hour  Intake 2263.87 ml  Output 4215 ml  Net -1951.13 ml       Physical Exam    General: Lying in bed. NAD.  HEENT: Normal Neck: Supple. JVP elevated. RIJ swan. Carotids 2+ bilat; no bruits. No thyromegaly or nodule noted. Cor: PMI nondisplaced. RRR, No M/G/R noted. Sternal dressing C/D/I Lungs: Mildly diminished basilar sounds.  Abdomen: Soft, non-tender, non-distended, no HSM. No bruits or masses. +BS  Extremities: No cyanosis, clubbing, or rash. SCDs in place. Trace ankle edema. IABP in RFA.  Neuro: Alert & orientedx3, cranial nerves grossly intact. moves all 4 extremities w/o difficulty. Affect pleasant   Telemetry   NSR 80s, personally reviewed.   EKG    No new tracings.    Labs    CBC Recent Labs    02/24/17 0217  02/25/17 1614 02/25/17 1622 02/26/17 0415  WBC 10.3   < > 10.6*  --  9.0  NEUTROABS 6.4  --   --   --   --   HGB 11.4*   < > 8.0* 7.8* 7.5*  HCT 35.2*   < > 24.1* 23.0* 23.0*  MCV 96.2   < > 94.5  --  94.7  PLT 217   < > 137*  --  121*   < > = values in this interval not displayed.   Basic Metabolic Panel Recent Labs    29/52/84 0303 02/25/17 1614 02/25/17 1622 02/26/17 0415  NA 135  --  135 135  K  4.5  --  4.0 4.1  CL 105  --  96* 100*  CO2 21*  --   --  25  GLUCOSE 110*  --  130* 110*  BUN <5*  --  4* 5*  CREATININE 0.65 0.60* 0.50* 0.59*  CALCIUM 7.8*  --   --  8.0*  MG 2.1 1.9  --   --    Liver Function Tests Recent Labs    02/23/17 1025  AST 35  ALT 21  ALKPHOS 55  BILITOT 0.5  PROT 6.1*  ALBUMIN 3.2*   No results for input(s): LIPASE, AMYLASE in the last 72 hours. Cardiac Enzymes No results for input(s): CKTOTAL, CKMB, CKMBINDEX, TROPONINI in the last 72 hours.  BNP: BNP (last 3 results) No results for input(s): BNP in the last 8760 hours.  ProBNP (last 3 results) No results for input(s): PROBNP in the last 8760 hours.   D-Dimer No results for input(s): DDIMER in the last 72 hours. Hemoglobin A1C Recent Labs    02/23/17 1025  HGBA1C 5.5   Fasting Lipid Panel No  results for input(s): CHOL, HDL, LDLCALC, TRIG, CHOLHDL, LDLDIRECT in the last 72 hours. Thyroid Function Tests No results for input(s): TSH, T4TOTAL, T3FREE, THYROIDAB in the last 72 hours.  Invalid input(s): FREET3 Other results:  Imaging   No results found.  Medications:    Scheduled Medications: . acetaminophen  1,000 mg Oral Q6H   Or  . acetaminophen (TYLENOL) oral liquid 160 mg/5 mL  1,000 mg Per Tube Q6H  . aspirin EC  325 mg Oral Daily   Or  . aspirin  324 mg Per Tube Daily  . bisacodyl  10 mg Oral Daily   Or  . bisacodyl  10 mg Rectal Daily  . Chlorhexidine Gluconate Cloth  6 each Topical Daily  . docusate sodium  200 mg Oral Daily  . insulin aspart  0-24 Units Subcutaneous Q4H  . mouth rinse  15 mL Mouth Rinse BID  . pantoprazole  40 mg Oral Daily  . sodium chloride flush  10-40 mL Intracatheter Q12H  . sodium chloride flush  3 mL Intravenous Q12H    Infusions: . sodium chloride 20 mL/hr at 02/25/17 2019  . sodium chloride    . sodium chloride 20 mL/hr at 02/24/17 1400  . amiodarone 30 mg/hr (02/26/17 0728)  . cefUROXime (ZINACEF)  IV Stopped (02/25/17 2227)  . dexmedetomidine (PRECEDEX) IV infusion Stopped (02/25/17 0745)  . DOPamine 5 mcg/kg/min (02/25/17 2035)  . lactated ringers    . lactated ringers    . lactated ringers 20 mL/hr at 02/25/17 2019  . milrinone Stopped (02/25/17 0820)  . nitroGLYCERIN Stopped (02/24/17 1330)  . phenylephrine (NEO-SYNEPHRINE) Adult infusion 15 mcg/min (02/26/17 0528)    PRN Medications: sodium chloride, lactated ringers, metoprolol tartrate, midazolam, morphine injection, ondansetron (ZOFRAN) IV, oxyCODONE, sodium chloride flush, sodium chloride flush, traMADol    Patient Profile   [name] Cruz is a 62 year old with a history of hemorrhagic stroke, htn, memory impairment.   [facility] with chest pain and A fib RVR. Had LHC with multivessel CAD. IABP left in place and planned for CABG  Assessment/Plan    1. CAD -> NSTEMI - s/p CABG 02/24/17 x 3  (LiMA to LAD, R Saphenous to LCx and PDA) - Extubated day of surgery.  - Doing well.  - IABP 1:3 with stable waveform. Likely out today.   2. Acute systolic CHF due to ICM - Echo pre  op 45-50%, though ? Accuracy as LV gram 25-30%.  - Volume status mildly elevated. Remains ~ 15 lbs up from pre-op  - Continue IV lasix 40 mg daily with good response yesterday.  - Remains on IABP - Coox 59% on Dopamine 5 and Neo 15  3. Afib RVR - Remains on amio gtt. Would continue while on pressors.  - A paced so difficult to tell underlying rhythm currently.  - CHA2DS2-VASc Score is at least 5. (CHF, HTN, CVD, Stroke)  4. HTN - Remains hypotensive post op. No room for meds at this time.   5. H/o Hemorrhagic Stroke - ? Due to uncontrolled HTN in 2007 per PCP notes. No change.   Slowly progressing.  Likely pull IABP today. Adjust pressors as necessary, but continue to wean as tolerated.  Medication concerns reviewed with patient and pharmacy team. Barriers identified: None at this time.   Length of Stay: 5  Luane School  02/26/2017, 7:54 AM  Advanced Heart Failure Team Pager [phone] (M-F; 7a - 4p)  Please contact [facility] for night-coverage after hours (4p -7a ) and weekends on [email or web address]  Patient seen and examined with the above-signed Advanced Practice Provider and/or Housestaff. I personally reviewed laboratory data, imaging studies and relevant notes. I independently examined the patient and formulated the important aspects of the plan. I have edited the note to reflect any of my changes or salient points. I have personally discussed the plan with the patient and/or family.  IABP pulled out this morning. Swan numbers reviewed personally and hemodynamics stable on dopamine 5 and neo 40. Volume status remains elevated. Maintaining NSR on IV amio. Will give lasix 40 mg IV x 1. Wean neo as tolerated. Can pull swan. Will repeat echo.  Continue amio for now. Ambulate.   Arvilla Meres, MD  11:16 AM

## 2017-02-26 NOTE — Progress Notes (Signed)
CT surgery p.m. Rounds  Patient had excellent day-balloon pump removed Weaning Neo-Synephrine Now out of bed to chair Diuresing 2 L Continue current care

## 2017-02-26 NOTE — Progress Notes (Addendum)
TCTS DAILY ICU PROGRESS NOTE                   [address]            [place] [address]          [phone]   2 Days Post-Op Procedure(s) (LRB): CORONARY ARTERY BYPASS GRAFTING (CABG) x 3 ON PUMP USING LEFT INTERNAL MAMMARY ARTERY TO LEFT ANTERIOR DESENDING CORNARY ARTERY, RIGHT GREATER SAPHENOUS VEIN TO LEFT CIRCUMFLEX ARTERY AND POSTERIOR DESENDING ARTERY. RIGHT GREATER SAPHENOUS VEIN OBTAINED VIA ENDOVEIN HARVEST. (N/A) TRANSESOPHAGEAL ECHOCARDIOGRAM (TEE) (N/A)  Total Length of Stay:  LOS: 5 days   Subjective: Feels okay this morning. He is having some left sided chest discomfort.   Objective: Vital signs in last 24 hours: Temp:  [98.2 F (36.8 C)-100.2 F (37.9 C)] 98.6 F (37 C) (02/13 0700) Pulse Rate:  [25-147] 79 (02/13 0700) Cardiac Rhythm: Atrial paced (02/13 0400) Resp:  [9-29] 12 (02/13 0700) BP: (85-122)/(52-85) 91/53 (02/13 0700) SpO2:  [64 %-100 %] 99 % (02/13 0700) Arterial Line BP: (81-131)/(42-68) 90/46 (02/13 0700) Weight:  [184 lb 8.4 oz (83.7 kg)] 184 lb 8.4 oz (83.7 kg) (02/13 0645)  Filed Weights   02/24/17 0400 02/25/17 0429 02/26/17 0645  Weight: 168 lb 6.9 oz (76.4 kg) 188 lb 11.4 oz (85.6 kg) 184 lb 8.4 oz (83.7 kg)    Weight change: -3 oz (-1.9 kg)   Hemodynamic parameters for last 24 hours: PAP: (28-57)/(11-27) 31/15 CVP:  [5 mmHg-18 mmHg] 10 mmHg CO:  [6.2 L/min-7.5 L/min] 7.5 L/min CI:  [3.5 L/min/m2-4.2 L/min/m2] 4.2 L/min/m2  Intake/Output from previous day: 02/12 0701 - 02/13 0700 In: 2263.9 [I.V.:2063.9; IV Piggyback:200] Out: 4215 [Urine:3935; Chest Tube:280]  Intake/Output this shift: No intake/output data recorded.  Current Meds: Scheduled Meds: . acetaminophen  1,000 mg Oral Q6H   Or  . acetaminophen (TYLENOL) oral liquid 160 mg/5 mL  1,000 mg Per Tube Q6H  . aspirin EC  325 mg Oral Daily   Or  . aspirin  324 mg Per Tube Daily  . bisacodyl  10 mg Oral Daily   Or  . bisacodyl  10 mg Rectal Daily    . Chlorhexidine Gluconate Cloth  6 each Topical Daily  . docusate sodium  200 mg Oral Daily  . insulin aspart  0-24 Units Subcutaneous Q4H  . mouth rinse  15 mL Mouth Rinse BID  . pantoprazole  40 mg Oral Daily  . sodium chloride flush  10-40 mL Intracatheter Q12H  . sodium chloride flush  3 mL Intravenous Q12H   Continuous Infusions: . sodium chloride 20 mL/hr at 02/25/17 2019  . sodium chloride    . sodium chloride 20 mL/hr at 02/24/17 1400  . amiodarone 30 mg/hr (02/26/17 0728)  . cefUROXime (ZINACEF)  IV Stopped (02/25/17 2227)  . dexmedetomidine (PRECEDEX) IV infusion Stopped (02/25/17 0745)  . DOPamine 5 mcg/kg/min (02/25/17 2035)  . lactated ringers    . lactated ringers    . lactated ringers 20 mL/hr at 02/25/17 2019  . milrinone Stopped (02/25/17 0820)  . nitroGLYCERIN Stopped (02/24/17 1330)  . phenylephrine (NEO-SYNEPHRINE) Adult infusion 15 mcg/min (02/26/17 0528)   PRN Meds:.sodium chloride, lactated ringers, metoprolol tartrate, midazolam, morphine injection, ondansetron (ZOFRAN) IV, oxyCODONE, sodium chloride flush, sodium chloride flush, traMADol  General appearance: alert, cooperative and no distress Heart: regular rate and rhythm, S1, S2 normal, no murmur, click, rub or gallop Lungs: clear to auscultation bilaterally Abdomen: soft, non-tender;  bowel sounds normal; no masses,  no organomegaly Extremities: 2+ non pitting edema in upper and lower extremity Wound: clean and dry dressed with a sterile dressing  Lab Results: CBC: Recent Labs    02/25/17 1614 02/25/17 1622 02/26/17 0415  WBC 10.6*  --  9.0  HGB 8.0* 7.8* 7.5*  HCT 24.1* 23.0* 23.0*  PLT 137*  --  121*   BMET:  Recent Labs    02/25/17 0303  02/25/17 1622 02/26/17 0415  NA 135  --  135 135  K 4.5  --  4.0 4.1  CL 105  --  96* 100*  CO2 21*  --   --  25  GLUCOSE 110*  --  130* 110*  BUN <5*  --  4* 5*  CREATININE 0.65   < > 0.50* 0.59*  CALCIUM 7.8*  --   --  8.0*   < > = values in  this interval not displayed.    CMET: Lab Results  Component Value Date   WBC 9.0 02/26/2017   HGB 7.5 (L) 02/26/2017   HCT 23.0 (L) 02/26/2017   PLT 121 (L) 02/26/2017   GLUCOSE 110 (H) 02/26/2017   CHOL 179 01/28/2017   TRIG 118.0 01/28/2017   HDL 62.50 01/28/2017   LDLDIRECT 122.0 07/22/2016   LDLCALC 92 01/28/2017   ALT 21 02/23/2017   AST 35 02/23/2017   NA 135 02/26/2017   K 4.1 02/26/2017   CL 100 (L) 02/26/2017   CREATININE 0.59 (L) 02/26/2017   BUN 5 (L) 02/26/2017   CO2 25 02/26/2017   TSH 2.054 02/23/2017   PSA 0.51 01/28/2017   INR 1.32 02/24/2017   HGBA1C 5.5 02/23/2017      PT/INR:  Recent Labs    02/24/17 1400  LABPROT 16.3*  INR 1.32   Radiology: No results found.   Assessment/Plan: S/P Procedure(s) (LRB): CORONARY ARTERY BYPASS GRAFTING (CABG) x 3 ON PUMP USING LEFT INTERNAL MAMMARY ARTERY TO LEFT ANTERIOR DESENDING CORNARY ARTERY, RIGHT GREATER SAPHENOUS VEIN TO LEFT CIRCUMFLEX ARTERY AND POSTERIOR DESENDING ARTERY. RIGHT GREATER SAPHENOUS VEIN OBTAINED VIA ENDOVEIN HARVEST. (N/A) TRANSESOPHAGEAL ECHOCARDIOGRAM (TEE) (N/A)  1. CV-BP soft this morning 91/53. NSR in the 80s. Turned pacer off and switched to backup rate of 60. On Dopamine, Neo, and Amio gtt. IABP 1:3 coox this morning 59.4. Planned removal today in the cath lab.  2. Pulm-CXR shows bilateral pleural effusions L > R. Slightly improved from yesterday. Tolerating 2L [place] with excellent oxygen saturation. Chest tube with 280cc/24 hours 3. Renal-creatinine 0.59, electrolytes ok. Making good urine. Received Lasix x 1 yesterday. Would dose again today since remains about 12 lbs fluid overloaded.  4. H and H 7.5/23.0, expected acute blood loss anemia. Continue to trend 5. Endo-blood glucose level well controlled on current regimen.     Sharlene Doryessa N Conte 02/26/2017 7:33 AM  iab out today  I have seen and examined [name] Cruz and agree with the above assessment  and plan.  Delight OvensEdward B Abeera Flannery  MD Beeper [phone] Office [phone] 02/26/2017 9:02 AM

## 2017-02-26 NOTE — Progress Notes (Signed)
Site area: RFA IABP catheter and sheath Site Prior to Removal:  Level 0 Pressure Applied For:20 min Manual:   yes Patient Status During Pull:  stable Post Pull Site:  Level 0 Post Pull Instructions Given: yes  Post Pull Pulses Present: doppler Dressing Applied:  tegaderm Bedrest begins @ 0900 Comments:

## 2017-02-27 LAB — BASIC METABOLIC PANEL
ANION GAP: 10 (ref 5–15)
BUN: 5 mg/dL — ABNORMAL LOW (ref 6–20)
CO2: 28 mmol/L (ref 22–32)
CREATININE: 0.55 mg/dL — AB (ref 0.61–1.24)
Calcium: 8.3 mg/dL — ABNORMAL LOW (ref 8.9–10.3)
Chloride: 101 mmol/L (ref 101–111)
GLUCOSE: 108 mg/dL — AB (ref 65–99)
Potassium: 3.5 mmol/L (ref 3.5–5.1)
Sodium: 139 mmol/L (ref 135–145)

## 2017-02-27 LAB — TYPE AND SCREEN
ABO/RH(D): O POS
Antibody Screen: NEGATIVE
Unit division: 0
Unit division: 0
Unit division: 0
Unit division: 0

## 2017-02-27 LAB — CBC
HCT: 25.2 % — ABNORMAL LOW (ref 39.0–52.0)
Hemoglobin: 8.1 g/dL — ABNORMAL LOW (ref 13.0–17.0)
MCH: 30.6 pg (ref 26.0–34.0)
MCHC: 32.1 g/dL (ref 30.0–36.0)
MCV: 95.1 fL (ref 78.0–100.0)
Platelets: 122 10*3/uL — ABNORMAL LOW (ref 150–400)
RBC: 2.65 MIL/uL — ABNORMAL LOW (ref 4.22–5.81)
RDW: 13.6 % (ref 11.5–15.5)
WBC: 6.8 10*3/uL (ref 4.0–10.5)

## 2017-02-27 LAB — GLUCOSE, CAPILLARY
GLUCOSE-CAPILLARY: 63 mg/dL — AB (ref 65–99)
GLUCOSE-CAPILLARY: 83 mg/dL (ref 65–99)
GLUCOSE-CAPILLARY: 83 mg/dL (ref 65–99)
GLUCOSE-CAPILLARY: 62 mg/dL — AB (ref 65–99)
Glucose-Capillary: 102 mg/dL — ABNORMAL HIGH (ref 65–99)
Glucose-Capillary: 53 mg/dL — ABNORMAL LOW (ref 65–99)
Glucose-Capillary: 82 mg/dL (ref 65–99)
Glucose-Capillary: 91 mg/dL (ref 65–99)

## 2017-02-27 LAB — BPAM RBC
Blood Product Expiration Date: 201903062359
Blood Product Expiration Date: 201903112359
Blood Product Expiration Date: 201903112359
Blood Product Expiration Date: 201903112359
ISSUE DATE / TIME: 201902110946
ISSUE DATE / TIME: 201902110946
ISSUE DATE / TIME: 201902120905
ISSUE DATE / TIME: 201902121135
Unit Type and Rh: 5100
Unit Type and Rh: 5100
Unit Type and Rh: 5100
Unit Type and Rh: 5100

## 2017-02-27 LAB — COOXEMETRY PANEL
Carboxyhemoglobin: 1.1 % (ref 0.5–1.5)
Methemoglobin: 1 % (ref 0.0–1.5)
O2 Saturation: 65.6 %
Total hemoglobin: 11.3 g/dL — ABNORMAL LOW (ref 12.0–16.0)

## 2017-02-27 MED ORDER — AMIODARONE HCL 200 MG PO TABS
200.0000 mg | ORAL_TABLET | Freq: Every day | ORAL | Status: DC
  Administered 2017-02-27 – 2017-03-02 (×4): 200 mg via ORAL
  Filled 2017-02-27 (×4): qty 1

## 2017-02-27 MED ORDER — FUROSEMIDE 10 MG/ML IJ SOLN
40.0000 mg | Freq: Once | INTRAMUSCULAR | Status: AC
  Administered 2017-02-27: 40 mg via INTRAVENOUS
  Filled 2017-02-27: qty 4

## 2017-02-27 MED ORDER — POTASSIUM CHLORIDE CRYS ER 20 MEQ PO TBCR
20.0000 meq | EXTENDED_RELEASE_TABLET | ORAL | Status: DC | PRN
  Administered 2017-02-27: 20 meq via ORAL
  Filled 2017-02-27: qty 1

## 2017-02-27 NOTE — Progress Notes (Addendum)
Advanced Heart Failure Rounding Note  PCP-Cardiologist: No primary care provider on file.   Subjective:    S/p CABG x 3 02/24/2017 (LiMA to LAD, R Saphenous to LCx and PDA)  Extubated 02/24/17 (Evening after operation).  IABP removed 02/26/17.  Coox 65.6% on Dopamine 3. Neo off yesterday.   Feeling good this am. Sitting up in chair. Denies SOB. Remains sore.  Remains in NSR.   Down another 4 lbs with IV lasix 40 mg x 1. Still 10 pounds up from baseline.    LHC 02/21/17 1. Severe 3-vessel coronary artery disease, including 60% LMCA stenosis, chronic total occlusion of ostial LAD, 70% proximal LCx lesion, and sequential 70-99% proximal, mid, and distal RCA lesions. Mid and distal LAD fill via right-to-left collaterals. 2. Severely reduced left ventricular contraction (LVEF 25-30%). 3. Mildly elevated left ventricular filling pressure (LVEDP 20 mmHg). 4. Successful placement of 40 mL intraaortic balloon pump via the right common femoral artery.  Echo 02/21/17 LVEF 45-50%  Objective:   Weight Range: 180 lb 14.4 oz (82.1 kg) Body mass index is 33.09 kg/m.   Vital Signs:   Temp:  [98 F (36.7 C)-99.2 F (37.3 C)] 98.1 F (36.7 C) (02/14 0400) Pulse Rate:  [25-101] 78 (02/14 0700) Resp:  [10-26] 12 (02/14 0700) BP: (78-130)/(56-86) 117/70 (02/14 0700) SpO2:  [82 %-100 %] 100 % (02/14 0700) Arterial Line BP: (79-160)/(39-75) 136/59 (02/13 1715) Weight:  [180 lb 14.4 oz (82.1 kg)] 180 lb 14.4 oz (82.1 kg) (02/14 0500) Last BM Date: 02/19/17  Weight change: Filed Weights   02/25/17 0429 02/26/17 0645 02/27/17 0500  Weight: 188 lb 11.4 oz (85.6 kg) 184 lb 8.4 oz (83.7 kg) 180 lb 14.4 oz (82.1 kg)    Intake/Output:   Intake/Output Summary (Last 24 hours) at 02/27/2017 0748 Last data filed at 02/27/2017 0700 Gross per 24 hour  Intake 1622.67 ml  Output 4775 ml  Net -3152.33 ml      Physical Exam    General: Lying in bed. NAD.  HEENT: Normal Neck: Supple. JVP 8-9 cm.  Carotids 2+ bilat; no bruits. No thyromegaly or nodule noted. Cor: PMI nondisplaced. RRR, No M/G/R noted. Sternal dressing C/D/I.  Lungs: Mildly diminished basilar sounds.  Abdomen: Soft, non-tender, non-distended, no HSM. No bruits or masses. +BS  Extremities: No cyanosis, clubbing, or rash. SCDs in place. Trace ankle edema R>L.  Neuro: Alert & orientedx3, cranial nerves grossly intact. moves all 4 extremities w/o difficulty. Affect pleasant   Telemetry   NSR 80s, personally reviewed.   EKG    No new tracings.    Labs    CBC Recent Labs    02/26/17 0415 02/27/17 0427  WBC 9.0 6.8  HGB 7.5* 8.1*  HCT 23.0* 25.2*  MCV 94.7 95.1  PLT 121* 122*   Basic Metabolic Panel Recent Labs    16/10/96 0303 02/25/17 1614  02/26/17 0415 02/27/17 0427  NA 135  --    < > 135 139  K 4.5  --    < > 4.1 3.5  CL 105  --    < > 100* 101  CO2 21*  --   --  25 28  GLUCOSE 110*  --    < > 110* 108*  BUN <5*  --    < > 5* 5*  CREATININE 0.65 0.60*   < > 0.59* 0.55*  CALCIUM 7.8*  --   --  8.0* 8.3*  MG 2.1 1.9  --   --   --    < > =  values in this interval not displayed.   Liver Function Tests No results for input(s): AST, ALT, ALKPHOS, BILITOT, PROT, ALBUMIN in the last 72 hours. No results for input(s): LIPASE, AMYLASE in the last 72 hours. Cardiac Enzymes No results for input(s): CKTOTAL, CKMB, CKMBINDEX, TROPONINI in the last 72 hours.  BNP: BNP (last 3 results) No results for input(s): BNP in the last 8760 hours.  ProBNP (last 3 results) No results for input(s): PROBNP in the last 8760 hours.   D-Dimer No results for input(s): DDIMER in the last 72 hours. Hemoglobin A1C No results for input(s): HGBA1C in the last 72 hours. Fasting Lipid Panel No results for input(s): CHOL, HDL, LDLCALC, TRIG, CHOLHDL, LDLDIRECT in the last 72 hours. Thyroid Function Tests No results for input(s): TSH, T4TOTAL, T3FREE, THYROIDAB in the last 72 hours.  Invalid input(s): FREET3 Other  results:  Imaging   No results found.  Medications:    Scheduled Medications: . acetaminophen  1,000 mg Oral Q6H   Or  . acetaminophen (TYLENOL) oral liquid 160 mg/5 mL  1,000 mg Per Tube Q6H  . aspirin EC  325 mg Oral Daily   Or  . aspirin  324 mg Per Tube Daily  . bisacodyl  10 mg Oral Daily   Or  . bisacodyl  10 mg Rectal Daily  . Chlorhexidine Gluconate Cloth  6 each Topical Daily  . docusate sodium  200 mg Oral Daily  . insulin aspart  0-24 Units Subcutaneous Q4H  . mouth rinse  15 mL Mouth Rinse BID  . pantoprazole  40 mg Oral Daily  . sodium chloride flush  10-40 mL Intracatheter Q12H  . sodium chloride flush  3 mL Intravenous Q12H    Infusions: . sodium chloride Stopped (02/26/17 1200)  . sodium chloride    . sodium chloride 10 mL/hr at 02/26/17 1054  . amiodarone 30 mg/hr (02/27/17 0745)  . dexmedetomidine (PRECEDEX) IV infusion Stopped (02/25/17 0745)  . DOPamine 3 mcg/kg/min (02/26/17 2000)  . lactated ringers    . lactated ringers    . lactated ringers 10 mL/hr at 02/26/17 2000  . milrinone Stopped (02/25/17 0820)  . nitroGLYCERIN Stopped (02/24/17 1330)  . phenylephrine (NEO-SYNEPHRINE) Adult infusion Stopped (02/26/17 1440)    PRN Medications: sodium chloride, lactated ringers, metoprolol tartrate, midazolam, morphine injection, ondansetron (ZOFRAN) IV, oxyCODONE, potassium chloride, sodium chloride flush, sodium chloride flush, traMADol    Patient Profile   [name] Cruz is a 62 year old with a history of hemorrhagic stroke, htn, memory impairment.   Admitted to [facility] 02/21/17 with chest pain and A fib RVR. Had LHC with multivessel CAD. IABP left in place and planned for CABG  Assessment/Plan   1. CAD -> NSTEMI - s/p CABG 02/24/17 x 3  (LiMA to LAD, R Saphenous to LCx and PDA) - Extubated day of surgery.  - IABP removed 2/13 without complication.  - Doing very well.  2. Acute systolic CHF due to ICM - Echo pre op 45-50%, though ? Accuracy as LV  gram 25-30%.  - Volume status improving.  - Coox 65.6% on Dopamine 3. - Give lasix 40 mg IV once more at least today.  - IABP pulled 02/26/17  3. Afib RVR - Remains on amio gtt. Would continue while on pressors.  - CHA2DS2-VASc Score is at least 5. (CHF, HTN, CVD, Stroke)  4. HTN - BP stable off pressors post op.   5. H/o Hemorrhagic Stroke - ? Due to uncontrolled HTN in 2007  per PCP notes. No change.   Medication concerns reviewed with patient and pharmacy team. Barriers identified: None at this time.   Length of Stay: [address]6  Luane SchoolMichael Andrew Tillery, PA-C  02/27/2017, 7:48 AM  Advanced Heart Failure Team Pager [phone] (M-F; 7a - 4p)  Please contact [facility] for night-coverage after hours (4p -7a ) and weekends on [email or web address]  Patient seen and examined with the above-signed Advanced Practice Provider and/or Housestaff. I personally reviewed laboratory data, imaging studies and relevant notes. I independently examined the patient and formulated the important aspects of the plan. I have edited the note to reflect any of my changes or salient points. I have personally discussed the plan with the patient and/or family.  Continues to improve. Co-ox stable with wean off pressors. Will get dopamine off today then can change amio to po. Weight still up about 10 pounds from baseline. Repeat lasix today. Continue to ambulate.   Arvilla Meresaniel Bellamy Rubey, MD  8:40 AM

## 2017-02-27 NOTE — Progress Notes (Signed)
Patient ID: [name] Cruz, male   DOB: [birth year][place] [birth year], 62 y.o.   MRN: [id number] TCTS DAILY ICU PROGRESS NOTE                   [address]            [place] [id number]          [phone]   3 Days Post-Op Procedure(s) (LRB): CORONARY ARTERY BYPASS GRAFTING (CABG) x 3 ON PUMP USING LEFT INTERNAL MAMMARY ARTERY TO LEFT ANTERIOR DESENDING CORNARY ARTERY, RIGHT GREATER SAPHENOUS VEIN TO LEFT CIRCUMFLEX ARTERY AND POSTERIOR DESENDING ARTERY. RIGHT GREATER SAPHENOUS VEIN OBTAINED VIA ENDOVEIN HARVEST. (N/A) TRANSESOPHAGEAL ECHOCARDIOGRAM (TEE) (N/A)  Total Length of Stay:  LOS: 6 days   Subjective: Patient sitting in chair feels much better today, incisional pain is improved  Objective: Vital signs in last 24 hours: Temp:  [98 F (36.7 C)-99.2 F (37.3 C)] 98 F (36.7 C) (02/14 0753) Pulse Rate:  [25-101] 78 (02/14 0700) Cardiac Rhythm: Normal sinus rhythm (02/14 0400) Resp:  [10-23] 12 (02/14 0700) BP: (78-130)/(56-86) 117/70 (02/14 0700) SpO2:  [82 %-100 %] 100 % (02/14 0700) Arterial Line BP: (99-160)/(46-75) 136/59 (02/13 1715) Weight:  [180 lb 14.4 oz (82.1 kg)] 180 lb 14.4 oz (82.1 kg) (02/14 0500)  Filed Weights   02/25/17 0429 02/26/17 0645 02/27/17 0500  Weight: 188 lb 11.4 oz (85.6 kg) 184 lb 8.4 oz (83.7 kg) 180 lb 14.4 oz (82.1 kg)    Weight change: -10 oz (-1.644 kg)   Hemodynamic parameters for last 24 hours: PAP: (35-49)/(13-18) 37/18 CVP:  [5 mmHg-11 mmHg] 11 mmHg  Intake/Output from previous day: 02/13 0701 - 02/14 0700 In: 1622.7 [P.O.:720; I.V.:902.7] Out: 4775 [Urine:4775]  Intake/Output this shift: No intake/output data recorded.  Current Meds: Scheduled Meds: . acetaminophen  1,000 mg Oral Q6H   Or  . acetaminophen (TYLENOL) oral liquid 160 mg/5 mL  1,000 mg Per Tube Q6H  . aspirin EC  325 mg Oral Daily   Or  . aspirin  324 mg Per Tube Daily  . bisacodyl  10 mg Oral Daily   Or  . bisacodyl  10 mg Rectal Daily  .  Chlorhexidine Gluconate Cloth  6 each Topical Daily  . docusate sodium  200 mg Oral Daily  . furosemide  40 mg Intravenous Once  . insulin aspart  0-24 Units Subcutaneous Q4H  . mouth rinse  15 mL Mouth Rinse BID  . pantoprazole  40 mg Oral Daily  . sodium chloride flush  10-40 mL Intracatheter Q12H  . sodium chloride flush  3 mL Intravenous Q12H   Continuous Infusions: . sodium chloride Stopped (02/26/17 1200)  . sodium chloride    . sodium chloride 10 mL/hr at 02/26/17 1054  . amiodarone 30 mg/hr (02/27/17 0745)  . dexmedetomidine (PRECEDEX) IV infusion Stopped (02/25/17 0745)  . DOPamine 3 mcg/kg/min (02/26/17 2000)  . lactated ringers    . lactated ringers    . lactated ringers 10 mL/hr at 02/26/17 2000  . milrinone Stopped (02/25/17 0820)  . nitroGLYCERIN Stopped (02/24/17 1330)  . phenylephrine (NEO-SYNEPHRINE) Adult infusion Stopped (02/26/17 1440)   PRN Meds:.sodium chloride, lactated ringers, metoprolol tartrate, midazolam, morphine injection, ondansetron (ZOFRAN) IV, oxyCODONE, potassium chloride, sodium chloride flush, sodium chloride flush, traMADol  General appearance: alert and cooperative Neurologic: intact Heart: regular rate and rhythm, S1, S2 normal, no murmur, click, rub or gallop Lungs: clear to auscultation bilaterally Abdomen: soft, non-tender; bowel sounds normal; no  masses,  no organomegaly Extremities: extremities normal, atraumatic, no cyanosis or edema and Homans sign is negative, no sign of DVT Wound: Sternum is stable and healing well  Lab Results: CBC: Recent Labs    02/26/17 0415 02/27/17 0427  WBC 9.0 6.8  HGB 7.5* 8.1*  HCT 23.0* 25.2*  PLT 121* 122*   BMET:  Recent Labs    02/26/17 0415 02/27/17 0427  NA 135 139  K 4.1 3.5  CL 100* 101  CO2 25 28  GLUCOSE 110* 108*  BUN 5* 5*  CREATININE 0.59* 0.55*  CALCIUM 8.0* 8.3*    CMET: Lab Results  Component Value Date   WBC 6.8 02/27/2017   HGB 8.1 (L) 02/27/2017   HCT 25.2 (L)  02/27/2017   PLT 122 (L) 02/27/2017   GLUCOSE 108 (H) 02/27/2017   CHOL 179 01/28/2017   TRIG 118.0 01/28/2017   HDL 62.50 01/28/2017   LDLDIRECT 122.0 07/22/2016   LDLCALC 92 01/28/2017   ALT 21 02/23/2017   AST 35 02/23/2017   NA 139 02/27/2017   K 3.5 02/27/2017   CL 101 02/27/2017   CREATININE 0.55 (L) 02/27/2017   BUN 5 (L) 02/27/2017   CO2 28 02/27/2017   TSH 2.054 02/23/2017   PSA 0.51 01/28/2017   INR 1.32 02/24/2017   HGBA1C 5.5 02/23/2017      PT/INR:  Recent Labs    02/24/17 1400  LABPROT 16.3*  INR 1.32   Radiology: No results found.   Assessment/Plan: S/P Procedure(s) (LRB): CORONARY ARTERY BYPASS GRAFTING (CABG) x 3 ON PUMP USING LEFT INTERNAL MAMMARY ARTERY TO LEFT ANTERIOR DESENDING CORNARY ARTERY, RIGHT GREATER SAPHENOUS VEIN TO LEFT CIRCUMFLEX ARTERY AND POSTERIOR DESENDING ARTERY. RIGHT GREATER SAPHENOUS VEIN OBTAINED VIA ENDOVEIN HARVEST. (N/A) TRANSESOPHAGEAL ECHOCARDIOGRAM (TEE) (N/A) Mobilize Diuresis d/c tubes/lines Wean off dopamine DC Foley Convert amiodarone to p.o., patient was admitted with acute infarct and SVT we will continue amnio    Delight Ovens 02/27/2017 8:36 AM   .Sherlynn Stalls

## 2017-02-27 NOTE — Progress Notes (Signed)
Patient ID: [name] Cruz, male   DOB: 1955-01-16, 62 y.o.   MRN: [id number] TCTS Evening Rounds:  Hemodynamically stable in sinus rhythm.  sats 100%  Diuresed well today.

## 2017-02-28 ENCOUNTER — Inpatient Hospital Stay (HOSPITAL_COMMUNITY): Payer: Medicare Other

## 2017-02-28 LAB — GLUCOSE, CAPILLARY
GLUCOSE-CAPILLARY: 89 mg/dL (ref 65–99)
GLUCOSE-CAPILLARY: 90 mg/dL (ref 65–99)
GLUCOSE-CAPILLARY: 106 mg/dL — AB (ref 65–99)
Glucose-Capillary: 72 mg/dL (ref 65–99)
Glucose-Capillary: 88 mg/dL (ref 65–99)
Glucose-Capillary: 83 mg/dL (ref 65–99)

## 2017-02-28 LAB — BASIC METABOLIC PANEL
Anion gap: 11 (ref 5–15)
BUN: 7 mg/dL (ref 6–20)
CHLORIDE: 99 mmol/L — AB (ref 101–111)
CO2: 27 mmol/L (ref 22–32)
Calcium: 8.5 mg/dL — ABNORMAL LOW (ref 8.9–10.3)
Creatinine, Ser: 0.57 mg/dL — ABNORMAL LOW (ref 0.61–1.24)
GFR calc Af Amer: >60 mL/min (ref >60)
GFR calc non Af Amer: >60 mL/min (ref >60)
Glucose, Bld: 104 mg/dL — ABNORMAL HIGH (ref 65–99)
POTASSIUM: 4 mmol/L (ref 3.5–5.1)
SODIUM: 137 mmol/L (ref 135–145)

## 2017-02-28 LAB — CBC
HCT: 27.8 % — ABNORMAL LOW (ref 39.0–52.0)
HEMATOCRIT: 28 % — AB (ref 39.0–52.0)
HEMOGLOBIN: 9.1 g/dL — AB (ref 13.0–17.0)
Hemoglobin: 9 g/dL — ABNORMAL LOW (ref 13.0–17.0)
MCH: 30.3 pg (ref 26.0–34.0)
MCH: 30.4 pg (ref 26.0–34.0)
MCHC: 32.4 g/dL (ref 30.0–36.0)
MCHC: 32.5 g/dL (ref 30.0–36.0)
MCV: 93.6 fL (ref 78.0–100.0)
MCV: 93.6 fL (ref 78.0–100.0)
Platelets: 251 10*3/uL (ref 150–400)
RBC: 2.97 MIL/uL — ABNORMAL LOW (ref 4.22–5.81)
RBC: 2.99 MIL/uL — AB (ref 4.22–5.81)
RDW: 13.4 % (ref 11.5–15.5)
RDW: 13.4 % (ref 11.5–15.5)
WBC: 7.2 10*3/uL (ref 4.0–10.5)
WBC: 9 10*3/uL (ref 4.0–10.5)

## 2017-02-28 MED ORDER — INSULIN ASPART 100 UNIT/ML ~~LOC~~ SOLN: 0.0000 [IU] | Freq: Three times a day (TID) | SUBCUTANEOUS | Status: DC

## 2017-02-28 MED ORDER — FUROSEMIDE 10 MG/ML IJ SOLN: 40.0000 mg | Freq: Once | INTRAMUSCULAR | Status: DC

## 2017-02-28 MED ORDER — ATORVASTATIN CALCIUM 80 MG PO TABS
80.0000 mg | ORAL_TABLET | Freq: Every day | ORAL | Status: DC
  Administered 2017-02-28 – 2017-03-01 (×2): 80 mg via ORAL
  Filled 2017-02-28 (×2): qty 1

## 2017-02-28 MED ORDER — MOVING RIGHT ALONG BOOK
Freq: Once | Status: DC
  Filled 2017-02-28: qty 1

## 2017-02-28 MED ORDER — CARVEDILOL 3.125 MG PO TABS
3.1250 mg | ORAL_TABLET | Freq: Two times a day (BID) | ORAL | Status: DC
  Administered 2017-02-28 – 2017-03-02 (×5): 3.125 mg via ORAL
  Filled 2017-02-28 (×5): qty 1

## 2017-02-28 MED ORDER — SODIUM CHLORIDE 0.9% FLUSH: 3.0000 mL | INTRAVENOUS | Status: DC | PRN

## 2017-02-28 MED ORDER — SODIUM CHLORIDE 0.9% FLUSH
3.0000 mL | Freq: Two times a day (BID) | INTRAVENOUS | Status: DC
  Administered 2017-02-28 – 2017-03-02 (×4): 3 mL via INTRAVENOUS

## 2017-02-28 MED ORDER — SODIUM CHLORIDE 0.9 % IV SOLN: 250.0000 mL | INTRAVENOUS | Status: DC | PRN

## 2017-02-28 MED ORDER — FUROSEMIDE 10 MG/ML IJ SOLN
40.0000 mg | Freq: Two times a day (BID) | INTRAMUSCULAR | Status: DC
  Administered 2017-02-28 – 2017-03-01 (×3): 40 mg via INTRAVENOUS
  Filled 2017-02-28 (×3): qty 4

## 2017-02-28 NOTE — Discharge Instructions (Signed)

## 2017-02-28 NOTE — Discharge Summary (Signed)
Physician Discharge Summary  Patient ID: [name] Cruz MRN: [id number] DOB/AGE: Jan 08, 1956 62 y.o.  Admit date: 02/20/2017 Discharge date: 03/02/2017  Admission Diagnoses: Patient Active Problem List   Diagnosis Date Noted  . NSTEMI (non-ST elevated myocardial infarction) (HCC) 02/21/2017  . Non-ST elevation (NSTEMI) myocardial infarction (HCC) 02/21/2017  . Atrial fibrillation with RVR (HCC)   . Medicare annual wellness visit, subsequent 10/17/2014  . Health maintenance examination 10/17/2014  . Advanced care planning/counseling discussion 10/17/2014  . Encephalomalacia 10/28/2012  . Seizures (HCC) 04/26/2012  . Habitual alcohol use 04/26/2012  . History of stroke with current residual effects   . Cognitive impairment   . HLD (hyperlipidemia)   . HTN (hypertension)   . Obesity, Class I, BMI 30-34.9     Discharge Diagnoses:  Active Problems:   HTN (hypertension)   Seizures (HCC)   NSTEMI (non-ST elevated myocardial infarction) (HCC)   Atrial fibrillation with RVR (HCC)   S/P CABG x 3   Coronary artery disease involving native heart without angina pectoris   Discharged Condition: good  HPI:  Patient came to the emergency room last night approximately 8 PM feeling poorly and with a rapid heart rate.  Stayed in the emergency room 12 hours as  troponin rose from 0.68-19.7 by this morning at 5 AM,  Underwent cardiac catheterization this morning and placement of the balloon pump.      Hospital Course:  The patient reported to the emergency department and was found to have 60% LMCA stenosis with multivessel coronary artery disease.  An emergent coronary artery bypass grafting x3 was performed by Dr. Tyrone Cruz on February 24, 2017.  He was transferred to the cardiac ICU in stable condition.  He was extubated in a timely manner.  We continued his intra-aortic balloon pump on 1:1.  He remained stable postop day 1.  Postop day 2 he remained on dopamine, neo-, and amiodarone drips.   His intra-aortic balloon pump was changed to 1:3.  He remained stable therefore it was removed in the Cath Lab.  His co-ox was 59.4.  We continue to gently diurese the patient for fluid overload.  We continue to trend his hemoglobin and hematocrit for expected acute blood loss anemia.  He did have some bilateral pleural effusions on chest x-ray.  We discontinued his Foley catheter.  We worked to wean off dopamine.  We continued his diuretic regimen for fluid overload.  We converted his amiodarone to oral tabs.  On postop day 4 he was stable to transfer to the telemetry unit for continued care.  We discontinued his central line at this time.  He continued to require Lasix 40 mg twice daily IV for fluid overload.  His kidney function remains stable.  His Lasix dose has been reduced and is felt that it can be stopped soon.  We will give an additional 5 days worth of Lasix 40 mg once daily.  He is tolerating gradually increasing activities using standard cardiac rehab modalities.  Incisions are noted to be healing well without evidence of infection.  He is tolerating diet.  He is significantly cognitively impaired from his previous stroke but will be staying with his sister at discharge.  Overall he appears to be quite stable for discharge on today's date.  Consults: heart failure  Significant Diagnostic Studies: angiography: Cardiac catheterization LEFT HEART CATH AND CORONARY ANGIOGRAPHY  Conclusion   Conclusions: 1. Severe 3-vessel coronary artery disease, including 60% LMCA stenosis, chronic total occlusion of ostial LAD, 70% proximal  LCx lesion, and sequential 70-99% proximal, mid, and distal RCA lesions. Mid and distal LAD fill via right-to-left collaterals. 2. Severely reduced left ventricular contraction (LVEF 25-30%). 3. Mildly elevated left ventricular filling pressure (LVEDP 20 mmHg). 4. Successful placement of 40 mL intraaortic balloon pump via the right common femoral  artery.  Recommendations: 1. Cardiac surgery consultation for CABG. 2. Transfer to ICU pending surgical evaluation. 3. Begin heparin infusion 2 hours after right radial sheath removal. 4. Portable chest x-ray to verify IABP placement when the patient reaches ICU. 5. Aggressive secondary prevention.  I have reviewed these findings and recommendations with [name] Cruz sister and healthcare POA [name] Cruz) by phone. She is in agreement.  [name] Kendall, MD [facility] Pager: [phone]    Treatments:  NAME:  [name] Cruz NO.:  MEDICAL RECORD NO.:  [id number]  LOCATION:                                 FACILITY:  PHYSICIAN:  [name] Plane, MD         DATE OF BIRTH:  DATE OF PROCEDURE:  02/24/2017 DATE OF DISCHARGE:                              OPERATIVE REPORT   PREOPERATIVE DIAGNOSES:  Coronary occlusive disease with recent non-ST- elevation myocardial infarction, intraaortic balloon pump in place.  POSTOPERATIVE DIAGNOSES:  Coronary occlusive disease with recent non-ST- elevation myocardial infarction, intraaortic balloon pump in place.  SURGICAL PROCEDURE:  Coronary artery bypass grafting x3 with the left internal mammary to the left anterior descending coronary artery, reverse saphenous vein graft to the distal right coronary artery, reverse saphenous vein graft to the circumflex coronary artery with right greater saphenous endoscopic vein harvesting from the thigh and upper calf.  SURGEON:  [name] Plane, MD.  FIRST ASSISTANT:  [name] Cruz, [place].   Discharge Exam: Blood pressure (!) 116/97, pulse 96, temperature 98.4 F (36.9 C), temperature source Oral, resp. rate (!) 21, height 5\' 2"  (1.575 m), weight 174 lb (78.9 kg), SpO2 93 %.     General appearance: alert, cooperative and no distress Heart: regular rate and rhythm Lungs: mildly dim in bases Abdomen: benign Extremities: trace edema Wound: incis  healing well   Disposition: 01-Home or Self Care  Discharge Instructions    Discharge patient   Complete by:  As directed    Staying with sister   Discharge disposition:  01-Home or Self Care   Discharge patient date:  03/02/2017     Allergies as of 03/02/2017   No Known Allergies     Medication List    STOP taking these medications   hydrochlorothiazide 25 MG tablet Commonly known as:  HYDRODIURIL   phenytoin 100 MG ER capsule Commonly known as:  DILANTIN     TAKE these medications   amiodarone 200 MG tablet Commonly known as:  PACERONE Take 1 tablet (200 mg total) by mouth daily.   aspirin 325 MG EC tablet Take 1 tablet (325 mg total) by mouth daily. What changed:    medication strength  how much to take   atorvastatin 80 MG tablet Commonly known as:  LIPITOR TAKE 1 TABLET (80 MG TOTAL) BY MOUTH DAILY. FOR CHOLESTEROL   carvedilol 3.125  MG tablet Commonly known as:  COREG Take 1 tablet (3.125 mg total) by mouth 2 (two) times daily with a meal.   furosemide 40 MG tablet Commonly known as:  LASIX Take 1 tablet (40 mg total) by mouth daily.   losartan 25 MG tablet Commonly known as:  COZAAR Take 1 tablet (25 mg total) by mouth daily.   oxyCODONE 5 MG immediate release tablet Commonly known as:  Oxy IR/ROXICODONE Take 1-2 tablets (5-10 mg total) by mouth every 6 (six) hours as needed for severe pain.   potassium chloride SA 20 MEQ tablet Commonly known as:  K-DUR,KLOR-CON Take 1 tablet (20 mEq total) by mouth daily.      Follow-up Information    Delight OvensGerhardt, Edward B, MD Follow up.   Specialty:  Cardiothoracic Surgery Why:  Your appointment is on 03/31/2017 at 1:30pm. Please [facility]00[facility] which is located on the first floor of our building.  Contact information: [address] [place] [address] [phone]        [name], [name], MD. Call in 1 day(s).   Specialty:  Family  Medicine Contact information: [address] [place] [address] [phone]        Corky CraftsVaranasi, Jayadeep S, MD Follow up.   Specialties:  Cardiology, Radiology, Interventional Cardiology Contact information: [address] [place] [address] [phone]          The patient has been discharged on:   1.Beta Blocker:  Yes [  x ]                              No   [   ]                              If No, reason:  2.Ace Inhibitor/ARB: Yes [   ]                                     No  [n    ]                                     If No, reason:low relative BP  3.Statin:   Yes [ x  ]                  No  [   ]                  If No, reason:  4.Ecasa:  Yes  [ x  ]                  No   [   ]                  If No, reason:   Signed: Rowe ClackWayne E Davidlee Jeanbaptiste 03/02/2017, 9:59 AM

## 2017-02-28 NOTE — Progress Notes (Signed)
02/28/2017 1545 Received pt to room 4E20 from 2H.  Pt is A&O, no C/O voiced.  Tele monitor applied and CCMD notified.  Oriented to room, call light and bed.  Call bell in reach, family at bedside. Kathryne HitchAllen, Farzana Koci C

## 2017-02-28 NOTE — Progress Notes (Signed)
Report called to 4 E. Will transfer on telemetr via W/c.

## 2017-02-28 NOTE — Progress Notes (Signed)
[facility] DAILY ICU PROGRESS NOTE                   [address]            [place] [address]          [phone]   4 Days Post-Op Procedure(s) (LRB): CORONARY ARTERY BYPASS GRAFTING (CABG) x 3 ON PUMP USING LEFT INTERNAL MAMMARY ARTERY TO LEFT ANTERIOR DESENDING CORNARY ARTERY, RIGHT GREATER SAPHENOUS VEIN TO LEFT CIRCUMFLEX ARTERY AND POSTERIOR DESENDING ARTERY. RIGHT GREATER SAPHENOUS VEIN OBTAINED VIA ENDOVEIN HARVEST. (N/A) TRANSESOPHAGEAL ECHOCARDIOGRAM (TEE) (N/A)  Total Length of Stay:  LOS: 7 days   Subjective: Feels good this morning. Happy that he was able to get cleaned up.   Objective: Vital signs in last 24 hours: Temp:  [97.8 F (36.6 C)-98.7 F (37.1 C)] 98.7 F (37.1 C) (02/15 0700) Pulse Rate:  [66-128] 128 (02/15 0600) Cardiac Rhythm: Normal sinus rhythm (02/15 0400) Resp:  [12-23] 16 (02/15 0600) BP: (65-129)/(40-90) 123/80 (02/15 0600) SpO2:  [96 %-100 %] 100 % (02/15 0600) Weight:  [181 lb 14.1 oz (82.5 kg)] 181 lb 14.1 oz (82.5 kg) (02/15 0500)  Filed Weights   02/26/17 0645 02/27/17 0500 02/28/17 0500  Weight: 184 lb 8.4 oz (83.7 kg) 180 lb 14.4 oz (82.1 kg) 181 lb 14.1 oz (82.5 kg)    Weight change: 15.7 oz (0.444 kg)      Intake/Output from previous day: 02/14 0701 - 02/15 0700 In: 617.2 [P.O.:480; I.V.:137.2] Out: 3150 [Urine:3150]  Intake/Output this shift: No intake/output data recorded.  Current Meds: Scheduled Meds: . acetaminophen  1,000 mg Oral Q6H   Or  . acetaminophen (TYLENOL) oral liquid 160 mg/5 mL  1,000 mg Per Tube Q6H  . amiodarone  200 mg Oral Daily  . aspirin EC  325 mg Oral Daily   Or  . aspirin  324 mg Per Tube Daily  . atorvastatin  80 mg Oral q1800  . bisacodyl  10 mg Oral Daily   Or  . bisacodyl  10 mg Rectal Daily  . carvedilol  3.125 mg Oral BID WC  . Chlorhexidine Gluconate Cloth  6 each Topical Daily  . docusate sodium  200 mg Oral Daily  . furosemide  40 mg Intravenous BID  . insulin  aspart  0-24 Units Subcutaneous Q4H  . mouth rinse  15 mL Mouth Rinse BID  . pantoprazole  40 mg Oral Daily  . sodium chloride flush  10-40 mL Intracatheter Q12H  . sodium chloride flush  3 mL Intravenous Q12H   Continuous Infusions: . sodium chloride Stopped (02/26/17 1200)  . sodium chloride    . sodium chloride 10 mL/hr at 02/26/17 1054  . dexmedetomidine (PRECEDEX) IV infusion Stopped (02/25/17 0745)  . DOPamine Stopped (02/27/17 1400)  . lactated ringers    . lactated ringers    . lactated ringers Stopped (02/27/17 1400)  . milrinone Stopped (02/25/17 0820)  . nitroGLYCERIN Stopped (02/24/17 1330)  . phenylephrine (NEO-SYNEPHRINE) Adult infusion Stopped (02/26/17 1440)   PRN Meds:.sodium chloride, lactated ringers, metoprolol tartrate, midazolam, morphine injection, ondansetron (ZOFRAN) IV, oxyCODONE, potassium chloride, sodium chloride flush, sodium chloride flush, traMADol  General appearance: alert, cooperative and no distress Heart: regular rate and rhythm, S1, S2 normal, no murmur, click, rub or gallop Lungs: clear to auscultation bilaterally and diminished in bilateral LL Abdomen: soft, non-tender; bowel sounds normal; no masses,  no organomegaly Extremities: 1-2+ non pitting pedal edema bilaterally Wound: clean and dry  Lab Results: CBC: Recent Labs    02/28/17 0443 02/28/17 0638  WBC 7.2 9.0  HGB 9.0* 9.1*  HCT 27.8* 28.0*  PLT SPECIMEN CLOTTED 251   BMET:  Recent Labs    02/27/17 0427 02/28/17 0443  NA 139 137  K 3.5 4.0  CL 101 99*  CO2 28 27  GLUCOSE 108* 104*  BUN 5* 7  CREATININE 0.55* 0.57*  CALCIUM 8.3* 8.5*    CMET: Lab Results  Component Value Date   WBC 9.0 02/28/2017   HGB 9.1 (L) 02/28/2017   HCT 28.0 (L) 02/28/2017   PLT 251 02/28/2017   GLUCOSE 104 (H) 02/28/2017   CHOL 179 01/28/2017   TRIG 118.0 01/28/2017   HDL 62.50 01/28/2017   LDLDIRECT 122.0 07/22/2016   LDLCALC 92 01/28/2017   ALT 21 02/23/2017   AST 35 02/23/2017     NA 137 02/28/2017   K 4.0 02/28/2017   CL 99 (L) 02/28/2017   CREATININE 0.57 (L) 02/28/2017   BUN 7 02/28/2017   CO2 27 02/28/2017   TSH 2.054 02/23/2017   PSA 0.51 01/28/2017   INR 1.32 02/24/2017   HGBA1C 5.5 02/23/2017      PT/INR: No results for input(s): LABPROT, INR in the last 72 hours. Radiology: Dg Chest Port 1 View  Result Date: 02/28/2017 CLINICAL DATA:  Swan-Ganz catheter removal. Recent coronary artery bypass grafting EXAM: PORTABLE CHEST 1 VIEW COMPARISON:  February 26, 2017 FINDINGS: Swan-Ganz catheter is been removed. Cordis tip is in the superior vena cava. Temporary pacemaker wires remain attached to the right heart. No pneumothorax. There is new airspace opacity in the left base concerning for developing pneumonia. There is a minimal pleural effusion on each side. Right lung is clear except for minimal right pleural effusion. There is mild cardiomegaly with pulmonary vascularity within normal limits. Patient is status post coronary artery bypass grafting. No adenopathy. No bone lesions. IMPRESSION: Cordis tip in superior vena cava.  No pneumothorax. New consolidation left base. Small pleural effusions bilaterally. Concern for developing pneumonia left base. Stable cardiac prominence. Electronically Signed   By: Bretta BangWilliam  Woodruff III M.D.   On: 02/28/2017 07:36     Assessment/Plan: S/P Procedure(s) (LRB): CORONARY ARTERY BYPASS GRAFTING (CABG) x 3 ON PUMP USING LEFT INTERNAL MAMMARY ARTERY TO LEFT ANTERIOR DESENDING CORNARY ARTERY, RIGHT GREATER SAPHENOUS VEIN TO LEFT CIRCUMFLEX ARTERY AND POSTERIOR DESENDING ARTERY. RIGHT GREATER SAPHENOUS VEIN OBTAINED VIA ENDOVEIN HARVEST. (N/A) TRANSESOPHAGEAL ECHOCARDIOGRAM (TEE) (N/A)   1. CV-BP better this morning. NSR in the 80s.  Off pressors. On low-dose Coreg, oral Amio, ASA, and Lipitor.  EPW in place.  2. Pulm-CXR shows small bilateral pleural effusions. Tolerating room air.  3. Renal-creatinine 0.57, electrolytes ok.  Making good urine. Lasix 40mg  BID. Weight stable.  4. H and H 9.1/28.0, expected acute blood loss anemia. Continue to trend 5. Endo-blood glucose level well controlled on current regimen. A1C is 5.5.   Plan: Transfer to the telemetry floor for continued care. Discontinue central line. Ambulate TID. Encouraged use of incentive spirometer.     Sharlene Doryessa N Ajax Schroll 02/28/2017 8:47 AM

## 2017-02-28 NOTE — Progress Notes (Signed)
Transferred to 4 E via wheelchair with telemetry. Sister up to see patient, she knows he is transferred.

## 2017-02-28 NOTE — Progress Notes (Addendum)
Advanced Heart Failure Rounding Note  PCP-Cardiologist: Lance Muss, MD   Subjective:    S/p CABG x 3 02/24/2017 (LiMA to LAD, R Saphenous to LCx and PDA)  Extubated 02/24/17 (Evening after operation).  IABP removed 02/26/17.  Now off pressors. No coox this am.   Continues to improve. Walking halls without difficult. No SOB or lightheadedness.   Negative 2.4 L but weight shows up 1 lb with IV lasix 40 mg x 1. Still 10 pounds up from baseline.   CXR this am with formal read pending. Looks to have continued mild edema.  LHC 02/21/17 1. Severe 3-vessel coronary artery disease, including 60% LMCA stenosis, chronic total occlusion of ostial LAD, 70% proximal LCx lesion, and sequential 70-99% proximal, mid, and distal RCA lesions. Mid and distal LAD fill via right-to-left collaterals. 2. Severely reduced left ventricular contraction (LVEF 25-30%). 3. Mildly elevated left ventricular filling pressure (LVEDP 20 mmHg). 4. Successful placement of 40 mL intraaortic balloon pump via the right common femoral artery.  Echo 02/21/17 LVEF 45-50%  Objective:   Weight Range: 181 lb 14.1 oz (82.5 kg) Body mass index is 33.27 kg/m.   Vital Signs:   Temp:  [97.8 F (36.6 C)-98.6 F (37 C)] 98.6 F (37 C) (02/15 0400) Pulse Rate:  [66-128] 128 (02/15 0600) Resp:  [12-23] 16 (02/15 0600) BP: (65-129)/(40-90) 123/80 (02/15 0600) SpO2:  [96 %-100 %] 100 % (02/15 0600) Weight:  [181 lb 14.1 oz (82.5 kg)] 181 lb 14.1 oz (82.5 kg) (02/15 0500) Last BM Date: 02/19/17  Weight change: Filed Weights   02/26/17 0645 02/27/17 0500 02/28/17 0500  Weight: 184 lb 8.4 oz (83.7 kg) 180 lb 14.4 oz (82.1 kg) 181 lb 14.1 oz (82.5 kg)    Intake/Output:   Intake/Output Summary (Last 24 hours) at 02/28/2017 0733 Last data filed at 02/28/2017 0330 Gross per 24 hour  Intake 617.2 ml  Output 3150 ml  Net -2532.8 ml      Physical Exam    General: Fatigued. NAD.  HEENT: Normal Neck: Supple. JVP  7-8 cm. Carotids 2+ bilat; no bruits. No thyromegaly or nodule noted. Cor: PMI nondisplaced. RRR, No M/G/R noted. Sternal dressing C/D/I Lungs: Mildly diminished basilar sounds.  Abdomen: Soft, non-tender, non-distended, no HSM. No bruits or masses. +BS  Extremities: No cyanosis, clubbing, or rash. R and LLE no edema. SCDs in place. Neuro: Alert & orientedx3, cranial nerves grossly intact. moves all 4 extremities w/o difficulty. Affect pleasant   Telemetry   NSR 80s, personally reviewed.   EKG    No new tracings.   Labs    CBC Recent Labs    02/28/17 0443 02/28/17 0638  WBC 7.2 9.0  HGB 9.0* 9.1*  HCT 27.8* 28.0*  MCV 93.6 93.6  PLT SPECIMEN CLOTTED 251   Basic Metabolic Panel Recent Labs    16/10/96 1614  02/27/17 0427 02/28/17 0443  NA  --    < > 139 137  K  --    < > 3.5 4.0  CL  --    < > 101 99*  CO2  --    < > 28 27  GLUCOSE  --    < > 108* 104*  BUN  --    < > 5* 7  CREATININE 0.60*   < > 0.55* 0.57*  CALCIUM  --    < > 8.3* 8.5*  MG 1.9  --   --   --    < > =  values in this interval not displayed.   Liver Function Tests No results for input(s): AST, ALT, ALKPHOS, BILITOT, PROT, ALBUMIN in the last 72 hours. No results for input(s): LIPASE, AMYLASE in the last 72 hours. Cardiac Enzymes No results for input(s): CKTOTAL, CKMB, CKMBINDEX, TROPONINI in the last 72 hours.  BNP: BNP (last 3 results) No results for input(s): BNP in the last 8760 hours.  ProBNP (last 3 results) No results for input(s): PROBNP in the last 8760 hours.   D-Dimer No results for input(s): DDIMER in the last 72 hours. Hemoglobin A1C No results for input(s): HGBA1C in the last 72 hours. Fasting Lipid Panel No results for input(s): CHOL, HDL, LDLCALC, TRIG, CHOLHDL, LDLDIRECT in the last 72 hours. Thyroid Function Tests No results for input(s): TSH, T4TOTAL, T3FREE, THYROIDAB in the last 72 hours.  Invalid input(s): FREET3 Other results:  Imaging   No results  found.  Medications:    Scheduled Medications: . acetaminophen  1,000 mg Oral Q6H   Or  . acetaminophen (TYLENOL) oral liquid 160 mg/5 mL  1,000 mg Per Tube Q6H  . amiodarone  200 mg Oral Daily  . aspirin EC  325 mg Oral Daily   Or  . aspirin  324 mg Per Tube Daily  . bisacodyl  10 mg Oral Daily   Or  . bisacodyl  10 mg Rectal Daily  . Chlorhexidine Gluconate Cloth  6 each Topical Daily  . docusate sodium  200 mg Oral Daily  . insulin aspart  0-24 Units Subcutaneous Q4H  . mouth rinse  15 mL Mouth Rinse BID  . pantoprazole  40 mg Oral Daily  . sodium chloride flush  10-40 mL Intracatheter Q12H  . sodium chloride flush  3 mL Intravenous Q12H    Infusions: . sodium chloride Stopped (02/26/17 1200)  . sodium chloride    . sodium chloride 10 mL/hr at 02/26/17 1054  . dexmedetomidine (PRECEDEX) IV infusion Stopped (02/25/17 0745)  . DOPamine Stopped (02/27/17 1400)  . lactated ringers    . lactated ringers    . lactated ringers Stopped (02/27/17 1400)  . milrinone Stopped (02/25/17 0820)  . nitroGLYCERIN Stopped (02/24/17 1330)  . phenylephrine (NEO-SYNEPHRINE) Adult infusion Stopped (02/26/17 1440)    PRN Medications: sodium chloride, lactated ringers, metoprolol tartrate, midazolam, morphine injection, ondansetron (ZOFRAN) IV, oxyCODONE, potassium chloride, sodium chloride flush, sodium chloride flush, traMADol    Patient Profile   [name] Cruz is a 62 year old with a history of hemorrhagic stroke, htn, memory impairment.   Admitted to [facility] 02/21/17 with chest pain and A fib RVR. Had LHC with multivessel CAD. IABP left in place and planned for CABG  Assessment/Plan   1. CAD -> NSTEMI - s/p CABG 02/24/17 x 3  (LiMA to LAD, R Saphenous to LCx and PDA) - Extubated day of surgery.  - IABP removed 2/13 without complication.  - Continues to progress.   2. Acute systolic CHF due to ICM - Echo pre op 45-50%, though ? Accuracy as LV gram 25-30%. Suspect will make good  recovery. - Volume status improving, but remains approx 10 lbs up from baseline.  - Now off pressors.  - Consider low dose BB.  - Repeat lasix 40 mg IV lasix this am.  - IABP pulled 02/26/17  3. Afib RVR - Now on po amiodarone with pressors off.  - CHA2DS2-VASc Score is at least 5. (CHF, HTN, CVD, Stroke), Though AFib likely in the setting of active ischemia.   4.  HTN - BP stable off pressors post op. No change.  5. H/o Hemorrhagic Stroke - ? Due to uncontrolled HTN in 2007 per PCP notes. No change.   Medication concerns reviewed with patient and pharmacy team. Barriers identified: None at this time.   Length of Stay: [address]7  [name] SchoolMichael Andrew Tillery, [name] Cruz  02/28/2017, 7:33 AM  Advanced Heart Failure Team Pager [phone] (M-F; 7a - 4p)  Please contact [facility] for night-coverage after hours (4p -7a ) and weekends on [email or web address]  Patient seen and examined with the above-signed Advanced Practice Provider and/or Housestaff. I personally reviewed laboratory data, imaging studies and relevant notes. I independently examined the patient and formulated the important aspects of the plan. I have edited the note to reflect any of my changes or salient points. I have personally discussed the plan with the patient and/or family.  Continues to improve. Now off pressors and ambulating hall. Maintaining NSR on po amio. Remains volume overloaded about 10 pounds up. Will give lasix 40 IV bid today. Watch K. Intra-op TEE reviewed. EF 45-50%. Start b-blocker. Can go to the floor. Would avoid anticoagulation at this point.   CXR read as possibility or developing LLL infiltrate. This is not obvious to me. No other s/s PNA. Encourage IS.   [name] Meresaniel Cleave Ternes, MD  8:15 AM

## 2017-03-01 ENCOUNTER — Encounter: Payer: Self-pay | Admitting: Family Medicine

## 2017-03-01 ENCOUNTER — Inpatient Hospital Stay (HOSPITAL_COMMUNITY): Payer: Medicare Other

## 2017-03-01 LAB — CBC
HEMATOCRIT: 26.3 % — AB (ref 39.0–52.0)
Hemoglobin: 8.6 g/dL — ABNORMAL LOW (ref 13.0–17.0)
MCH: 30.6 pg (ref 26.0–34.0)
MCHC: 32.7 g/dL (ref 30.0–36.0)
MCV: 93.6 fL (ref 78.0–100.0)
Platelets: 303 10*3/uL (ref 150–400)
RBC: 2.81 MIL/uL — ABNORMAL LOW (ref 4.22–5.81)
RDW: 13.4 % (ref 11.5–15.5)
WBC: 7.8 10*3/uL (ref 4.0–10.5)

## 2017-03-01 LAB — BASIC METABOLIC PANEL
ANION GAP: 13 (ref 5–15)
BUN: 11 mg/dL (ref 6–20)
CO2: 27 mmol/L (ref 22–32)
Calcium: 8.3 mg/dL — ABNORMAL LOW (ref 8.9–10.3)
Chloride: 96 mmol/L — ABNORMAL LOW (ref 101–111)
Creatinine, Ser: 0.76 mg/dL (ref 0.61–1.24)
GLUCOSE: 105 mg/dL — AB (ref 65–99)
POTASSIUM: 4 mmol/L (ref 3.5–5.1)
SODIUM: 136 mmol/L (ref 135–145)

## 2017-03-01 LAB — GLUCOSE, CAPILLARY
GLUCOSE-CAPILLARY: 108 mg/dL — AB (ref 65–99)
GLUCOSE-CAPILLARY: 82 mg/dL (ref 65–99)
Glucose-Capillary: 84 mg/dL (ref 65–99)
Glucose-Capillary: 66 mg/dL (ref 65–99)
Glucose-Capillary: 100 mg/dL — ABNORMAL HIGH (ref 65–99)

## 2017-03-01 MED ORDER — LOSARTAN POTASSIUM 25 MG PO TABS
25.0000 mg | ORAL_TABLET | Freq: Every day | ORAL | Status: DC
  Administered 2017-03-01 – 2017-03-02 (×2): 25 mg via ORAL
  Filled 2017-03-01 (×2): qty 1

## 2017-03-01 MED ORDER — FUROSEMIDE 40 MG PO TABS
40.0000 mg | ORAL_TABLET | Freq: Every day | ORAL | Status: DC
  Administered 2017-03-02: 40 mg via ORAL
  Filled 2017-03-01: qty 1

## 2017-03-01 NOTE — Progress Notes (Signed)
CARDIAC REHAB PHASE I   PRE:  Rate/Rhythm: Sinus rhythm 90  BP:    Sitting: 119/81    SaO2: 99% Room Air  MODE:  Ambulation: 800 ft   POST:  Rate/Rhythem: 95  BP:    Sitting: 99/85     SaO2: 96% Room Air  [phone] Patient ambulated in the hallway independently using the rolling walker. Tolerated well. Reviewed use of incentive spirometer with patient. Assisted back to recliner with call bell within reach.   Thayer HeadingsMaria Walden Whitaker RN BSN

## 2017-03-01 NOTE — Progress Notes (Signed)
03/01/2017 12:23 PM EPW D/C'd per order and per protocol.  Ends intact. Pt. Tolerated well.  Advised bedrest x1hr.  Call bell in reach.  Vital signs collected per protocol. Kathryne HitchAllen, Sulamita Lafountain C

## 2017-03-01 NOTE — Progress Notes (Addendum)
[address]       [place] [address]             [phone]      5 Days Post-Op Procedure(s) (LRB): CORONARY ARTERY BYPASS GRAFTING (CABG) x 3 ON PUMP USING LEFT INTERNAL MAMMARY ARTERY TO LEFT ANTERIOR DESENDING CORNARY ARTERY, RIGHT GREATER SAPHENOUS VEIN TO LEFT CIRCUMFLEX ARTERY AND POSTERIOR DESENDING ARTERY. RIGHT GREATER SAPHENOUS VEIN OBTAINED VIA ENDOVEIN HARVEST. (N/A) TRANSESOPHAGEAL ECHOCARDIOGRAM (TEE) (N/A) Subjective: Feels pretty well  Objective: Vital signs in last 24 hours: Temp:  [97.7 F (36.5 C)-98.7 F (37.1 C)] 98.3 F (36.8 C) (02/16 0802) Pulse Rate:  [66-98] 94 (02/16 0824) Cardiac Rhythm: Normal sinus rhythm (02/16 0815) Resp:  [16-20] 18 (02/16 0802) BP: (111-128)/(74-88) 127/80 (02/16 0802) SpO2:  [96 %-100 %] 98 % (02/16 0802) Weight:  [175 lb 12.8 oz (79.7 kg)] 175 lb 12.8 oz (79.7 kg) (02/16 0530)  Hemodynamic parameters for last 24 hours:    Intake/Output from previous day: 02/15 0701 - 02/16 0700 In: 480 [P.O.:480] Out: 3175 [Urine:3175] Intake/Output this shift: Total I/O In: 240 [P.O.:240] Out: 600 [Urine:600]  General appearance: alert, cooperative and no distress Heart: regular rate and rhythm Lungs: min dim in bases Abdomen: benign Extremities: trace edema Wound: incis healing well  Lab Results: Recent Labs    02/28/17 0638 03/01/17 0228  WBC 9.0 7.8  HGB 9.1* 8.6*  HCT 28.0* 26.3*  PLT 251 303   BMET:  Recent Labs    02/28/17 0443 03/01/17 0228  NA 137 136  K 4.0 4.0  CL 99* 96*  CO2 27 27  GLUCOSE 104* 105*  BUN 7 11  CREATININE 0.57* 0.76  CALCIUM 8.5* 8.3*    PT/INR: No results for input(s): LABPROT, INR in the last 72 hours. ABG    Component Value Date/Time   PHART 7.349 (L) 02/24/2017 1856   HCO3 22.4 02/24/2017 1856   TCO2 26 02/25/2017 1622   ACIDBASEDEF 3.0 (H) 02/24/2017 1856   O2SAT 65.6 02/27/2017 0442   CBG (last 3)  Recent Labs    02/28/17 1616  02/28/17 2043 03/01/17 0607  GLUCAP 83 106* 108*    Meds Scheduled Meds: . acetaminophen  1,000 mg Oral Q6H   Or  . acetaminophen (TYLENOL) oral liquid 160 mg/5 mL  1,000 mg Per Tube Q6H  . amiodarone  200 mg Oral Daily  . aspirin EC  325 mg Oral Daily   Or  . aspirin  324 mg Per Tube Daily  . atorvastatin  80 mg Oral q1800  . bisacodyl  10 mg Oral Daily   Or  . bisacodyl  10 mg Rectal Daily  . carvedilol  3.125 mg Oral BID WC  . docusate sodium  200 mg Oral Daily  . furosemide  40 mg Intravenous BID  . insulin aspart  0-24 Units Subcutaneous TID AC & HS  . mouth rinse  15 mL Mouth Rinse BID  . moving right along book   Does not apply Once  . pantoprazole  40 mg Oral Daily  . sodium chloride flush  3 mL Intravenous Q12H   Continuous Infusions: . sodium chloride     PRN Meds:.sodium chloride, metoprolol tartrate, ondansetron (ZOFRAN) IV, oxyCODONE, potassium chloride, sodium chloride flush, sodium chloride flush, traMADol  Xrays Dg Chest 2 View  Result Date: 03/01/2017 CLINICAL DATA:  CABG EXAM: CHEST  2 VIEW COMPARISON:  02/28/2017 FINDINGS: Improvement in bibasilar atelectasis. Small pleural effusions bilaterally.  Negative for edema. No pneumothorax IMPRESSION: Improvement in bibasilar atelectasis and small pleural effusions. Electronically Signed   By: Marlan Palauharles  Clark M.D.   On: 03/01/2017 09:01   Dg Chest Port 1 View  Result Date: 02/28/2017 CLINICAL DATA:  Swan-Ganz catheter removal. Recent coronary artery bypass grafting EXAM: PORTABLE CHEST 1 VIEW COMPARISON:  February 26, 2017 FINDINGS: Swan-Ganz catheter is been removed. Cordis tip is in the superior vena cava. Temporary pacemaker wires remain attached to the right heart. No pneumothorax. There is new airspace opacity in the left base concerning for developing pneumonia. There is a minimal pleural effusion on each side. Right lung is clear except for minimal right pleural effusion. There is mild cardiomegaly with  pulmonary vascularity within normal limits. Patient is status post coronary artery bypass grafting. No adenopathy. No bone lesions. IMPRESSION: Cordis tip in superior vena cava.  No pneumothorax. New consolidation left base. Small pleural effusions bilaterally. Concern for developing pneumonia left base. Stable cardiac prominence. Electronically Signed   By: Bretta BangWilliam  Woodruff III M.D.   On: 02/28/2017 07:36    Assessment/Plan: S/P Procedure(s) (LRB): CORONARY ARTERY BYPASS GRAFTING (CABG) x 3 ON PUMP USING LEFT INTERNAL MAMMARY ARTERY TO LEFT ANTERIOR DESENDING CORNARY ARTERY, RIGHT GREATER SAPHENOUS VEIN TO LEFT CIRCUMFLEX ARTERY AND POSTERIOR DESENDING ARTERY. RIGHT GREATER SAPHENOUS VEIN OBTAINED VIA ENDOVEIN HARVEST. (N/A) TRANSESOPHAGEAL ECHOCARDIOGRAM (TEE) (N/A)  1 excellent ongoing progress 2 hemodyn stable in sinus rhythm- AHF team managing  3 H/H slightly down- monitor 4 renal fxn is normal with good UOP 5 BS well controlled 6 push rehab/pulm toilet per routine 7 d/c wires today 8 suspect poss home in next 2-3 days with sister   LOS: 8 days    Rowe ClackWayne E Gold 03/01/2017  Looks great Plan discharge home Sunday morning Patient will be assisted by sister at home patient examined and medical record reviewed,agree with above note. Kathlee Nationseter Van Trigt III 03/01/2017

## 2017-03-01 NOTE — Progress Notes (Addendum)
Advanced Heart Failure Rounding Note  PCP-Cardiologist: [name], MD   Subjective:    S/p CABG x 3 02/24/2017 (LiMA to LAD, R Saphenous to LCx and PDA)  Echo 02/21/17 LVEF 45-50%  Doing well ambulated the hall with CR. No CP or SOB. Diuresing well on IV lasix. Weight down 6 pounds. No orthopnea or PND. Maintaining NSR.    Objective:   Weight Range: 79.7 kg (175 lb 12.8 oz) Body mass index is 32.15 kg/m.   Vital Signs:   Temp:  [97.7 F (36.5 C)-98.7 F (37.1 C)] 98.3 F (36.8 C) (02/16 0802) Pulse Rate:  [66-98] 94 (02/16 0824) Resp:  [16-20] 18 (02/16 0802) BP: (111-128)/(74-88) 127/80 (02/16 0802) SpO2:  [96 %-100 %] 98 % (02/16 0802) Weight:  [79.7 kg (175 lb 12.8 oz)] 79.7 kg (175 lb 12.8 oz) (02/16 0530) Last BM Date: (Prior to surgery)  Weight change: Filed Weights   02/27/17 0500 02/28/17 0500 03/01/17 0530  Weight: 82.1 kg (180 lb 14.4 oz) 82.5 kg (181 lb 14.1 oz) 79.7 kg (175 lb 12.8 oz)    Intake/Output:   Intake/Output Summary (Last 24 hours) at 03/01/2017 1113 Last data filed at 03/01/2017 1019 Gross per 24 hour  Intake 240 ml  Output 3475 ml  Net -3235 ml      Physical Exam    General:  Sitting up in bed . No resp difficulty HEENT: normal Neck: supple. JVP 7. Carotids 2+ bilat; no bruits. No lymphadenopathy or thryomegaly appreciated. Cor: PMI nondisplaced. Sternal wound ok. Regular rate & rhythm. No rubs, gallops or murmurs. Lungs: clear Abdomen: soft, nontender, nondistended. No hepatosplenomegaly. No bruits or masses. Good bowel sounds. Extremities: no cyanosis, clubbing, rash, trace edema Neuro: alert & orientedx3, cranial nerves grossly intact. moves all 4 extremities w/o difficulty. Affect pleasant   Telemetry   NSR 80-90s, personally reviewed.   EKG    No new tracings.   Labs    CBC Recent Labs    02/28/17 0638 03/01/17 0228  WBC 9.0 7.8  HGB 9.1* 8.6*  HCT 28.0* 26.3*  MCV 93.6 93.6  PLT 251 303   Basic  Metabolic Panel Recent Labs    14/78/2902/15/19 0443 03/01/17 0228  NA 137 136  K 4.0 4.0  CL 99* 96*  CO2 27 27  GLUCOSE 104* 105*  BUN 7 11  CREATININE 0.57* 0.76  CALCIUM 8.5* 8.3*   Liver Function Tests No results for input(s): AST, ALT, ALKPHOS, BILITOT, PROT, ALBUMIN in the last 72 hours. No results for input(s): LIPASE, AMYLASE in the last 72 hours. Cardiac Enzymes No results for input(s): CKTOTAL, CKMB, CKMBINDEX, TROPONINI in the last 72 hours.  BNP: BNP (last 3 results) No results for input(s): BNP in the last 8760 hours.  ProBNP (last 3 results) No results for input(s): PROBNP in the last 8760 hours.   D-Dimer No results for input(s): DDIMER in the last 72 hours. Hemoglobin A1C No results for input(s): HGBA1C in the last 72 hours. Fasting Lipid Panel No results for input(s): CHOL, HDL, LDLCALC, TRIG, CHOLHDL, LDLDIRECT in the last 72 hours. Thyroid Function Tests No results for input(s): TSH, T4TOTAL, T3FREE, THYROIDAB in the last 72 hours.  Invalid input(s): FREET3 Other results:  Imaging   Dg Chest 2 View  Result Date: 03/01/2017 CLINICAL DATA:  CABG EXAM: CHEST  2 VIEW COMPARISON:  02/28/2017 FINDINGS: Improvement in bibasilar atelectasis. Small pleural effusions bilaterally. Negative for edema. No pneumothorax IMPRESSION: Improvement in bibasilar atelectasis and small  pleural effusions. Electronically Signed   By: Marlan Palau M.D.   On: 03/01/2017 09:01    Medications:    Scheduled Medications: . acetaminophen  1,000 mg Oral Q6H   Or  . acetaminophen (TYLENOL) oral liquid 160 mg/5 mL  1,000 mg Per Tube Q6H  . amiodarone  200 mg Oral Daily  . aspirin EC  325 mg Oral Daily   Or  . aspirin  324 mg Per Tube Daily  . atorvastatin  80 mg Oral q1800  . bisacodyl  10 mg Oral Daily   Or  . bisacodyl  10 mg Rectal Daily  . carvedilol  3.125 mg Oral BID WC  . docusate sodium  200 mg Oral Daily  . furosemide  40 mg Intravenous BID  . insulin aspart  0-24  Units Subcutaneous TID AC & HS  . mouth rinse  15 mL Mouth Rinse BID  . moving right along book   Does not apply Once  . pantoprazole  40 mg Oral Daily  . sodium chloride flush  3 mL Intravenous Q12H    Infusions: . sodium chloride      PRN Medications: sodium chloride, metoprolol tartrate, ondansetron (ZOFRAN) IV, oxyCODONE, potassium chloride, sodium chloride flush, sodium chloride flush, traMADol    Patient Profile   [name] Cruz is a 62 year old with a history of hemorrhagic stroke, htn, memory impairment.   Admitted to [facility] 02/21/17 with chest pain and A fib RVR. Had LHC with multivessel CAD. IABP left in place and planned for CABG  Assessment/Plan   1. CAD -> NSTEMI - s/p CABG 02/24/17 x 3  (LiMA to LAD, R Saphenous to LCx and PDA) - Extubated day of surgery.  - IABP removed 2/13 without complication.  - Continues to progress. Walked today with CR. - On ASA, statin and b-blocker   2. Acute systolic CHF due to ICM - Echo pre op 45-50%, though ? Accuracy as LV gram 25-30%. Suspect will make good recovery. - Volume status much improved. Weight now just 3 pounds over pre-op. Will switch to oral lasix. Can likely stop soon.   - Continue b-blocker. Add losartan   3. Afib RVR, brief pre-op - Now on po amiodarone with pressors off.  - with h/o ICH and no recurrence would hold off on [facility] for now   4. HTN - BP stable off pressors post op. Adding losartan  5. H/o Hemorrhagic Stroke - ? Due to uncontrolled HTN in 2007 per PCP notes. No change.   6. Post-op anemia - per TCTS  7. Dispo - per TCTS. Likely home in 24-48 hours.  Length of Stay: 8  Arvilla Meres, MD  03/01/2017, 11:13 AM  Advanced Heart Failure Team Pager [phone] (M-F; 7a - 4p)  Please contact [facility] for night-coverage after hours (4p -7a ) and weekends on [email or web address]

## 2017-03-02 LAB — BASIC METABOLIC PANEL
ANION GAP: 11 (ref 5–15)
BUN: 9 mg/dL (ref 6–20)
CO2: 27 mmol/L (ref 22–32)
Calcium: 8.6 mg/dL — ABNORMAL LOW (ref 8.9–10.3)
Chloride: 98 mmol/L — ABNORMAL LOW (ref 101–111)
Creatinine, Ser: 0.62 mg/dL (ref 0.61–1.24)
Glucose, Bld: 109 mg/dL — ABNORMAL HIGH (ref 65–99)
POTASSIUM: 3.4 mmol/L — AB (ref 3.5–5.1)
SODIUM: 136 mmol/L (ref 135–145)

## 2017-03-02 LAB — GLUCOSE, CAPILLARY
GLUCOSE-CAPILLARY: 98 mg/dL (ref 65–99)
Glucose-Capillary: 107 mg/dL — ABNORMAL HIGH (ref 65–99)

## 2017-03-02 MED ORDER — OXYCODONE HCL 5 MG PO TABS: 5.0000 mg | ORAL_TABLET | Freq: Four times a day (QID) | ORAL | 0 refills | Status: DC | PRN

## 2017-03-02 MED ORDER — ASPIRIN 325 MG PO TBEC: 325.0000 mg | DELAYED_RELEASE_TABLET | Freq: Every day | ORAL | Status: DC

## 2017-03-02 MED ORDER — CARVEDILOL 3.125 MG PO TABS: 3.1250 mg | ORAL_TABLET | Freq: Two times a day (BID) | ORAL | 1 refills | Status: DC

## 2017-03-02 MED ORDER — FUROSEMIDE 40 MG PO TABS: 40.0000 mg | ORAL_TABLET | Freq: Every day | ORAL | 0 refills | Status: DC

## 2017-03-02 MED ORDER — LOSARTAN POTASSIUM 25 MG PO TABS: 25.0000 mg | ORAL_TABLET | Freq: Every day | ORAL | 1 refills | Status: DC

## 2017-03-02 MED ORDER — AMIODARONE HCL 200 MG PO TABS: 200.0000 mg | ORAL_TABLET | Freq: Every day | ORAL | 1 refills | Status: DC

## 2017-03-02 MED ORDER — POTASSIUM CHLORIDE CRYS ER 20 MEQ PO TBCR: 20.0000 meq | EXTENDED_RELEASE_TABLET | Freq: Every day | ORAL | 0 refills | Status: DC

## 2017-03-02 NOTE — Progress Notes (Addendum)
[address]       [place] [address]             [phone]      6 Days Post-Op Procedure(s) (LRB): CORONARY ARTERY BYPASS GRAFTING (CABG) x 3 ON PUMP USING LEFT INTERNAL MAMMARY ARTERY TO LEFT ANTERIOR DESENDING CORNARY ARTERY, RIGHT GREATER SAPHENOUS VEIN TO LEFT CIRCUMFLEX ARTERY AND POSTERIOR DESENDING ARTERY. RIGHT GREATER SAPHENOUS VEIN OBTAINED VIA ENDOVEIN HARVEST. (N/A) TRANSESOPHAGEAL ECHOCARDIOGRAM (TEE) (N/A) Subjective: Feels well, no new c/o  Objective: Vital signs in last 24 hours: Temp:  [98.1 F (36.7 C)-98.5 F (36.9 C)] 98.4 F (36.9 C) (02/17 0819) Pulse Rate:  [45-98] 96 (02/17 0819) Cardiac Rhythm: Normal sinus rhythm (02/16 2117) Resp:  [17-25] 21 (02/17 0415) BP: (105-120)/(71-97) 116/97 (02/17 0819) SpO2:  [93 %-98 %] 93 % (02/17 0819) Weight:  [174 lb (78.9 kg)] 174 lb (78.9 kg) (02/17 0415)  Hemodynamic parameters for last 24 hours:    Intake/Output from previous day: 02/16 0701 - 02/17 0700 In: 240 [P.O.:240] Out: 3275 [Urine:3275] Intake/Output this shift: No intake/output data recorded.  General appearance: alert, cooperative and no distress Heart: regular rate and rhythm Lungs: mildly dim in bases Abdomen: benign Extremities: trace edema Wound: incis healing well  Lab Results: Recent Labs    02/28/17 0638 03/01/17 0228  WBC 9.0 7.8  HGB 9.1* 8.6*  HCT 28.0* 26.3*  PLT 251 303   BMET:  Recent Labs    03/01/17 0228 03/02/17 0258  NA 136 136  K 4.0 3.4*  CL 96* 98*  CO2 27 27  GLUCOSE 105* 109*  BUN 11 9  CREATININE 0.76 0.62  CALCIUM 8.3* 8.6*    PT/INR: No results for input(s): LABPROT, INR in the last 72 hours. ABG    Component Value Date/Time   PHART 7.349 (L) 02/24/2017 1856   HCO3 22.4 02/24/2017 1856   TCO2 26 02/25/2017 1622   ACIDBASEDEF 3.0 (H) 02/24/2017 1856   O2SAT 65.6 02/27/2017 0442   CBG (last 3)  Recent Labs    03/01/17 2121 03/01/17 2152 03/02/17 0635  GLUCAP 66  100* 98    Meds Scheduled Meds: . amiodarone  200 mg Oral Daily  . aspirin EC  325 mg Oral Daily   Or  . aspirin  324 mg Per Tube Daily  . atorvastatin  80 mg Oral q1800  . bisacodyl  10 mg Oral Daily   Or  . bisacodyl  10 mg Rectal Daily  . carvedilol  3.125 mg Oral BID WC  . docusate sodium  200 mg Oral Daily  . furosemide  40 mg Oral Daily  . insulin aspart  0-24 Units Subcutaneous TID AC & HS  . losartan  25 mg Oral Daily  . mouth rinse  15 mL Mouth Rinse BID  . moving right along book   Does not apply Once  . pantoprazole  40 mg Oral Daily  . sodium chloride flush  3 mL Intravenous Q12H   Continuous Infusions: . sodium chloride     PRN Meds:.sodium chloride, metoprolol tartrate, ondansetron (ZOFRAN) IV, oxyCODONE, potassium chloride, sodium chloride flush, sodium chloride flush, traMADol  Xrays Dg Chest 2 View  Result Date: 03/01/2017 CLINICAL DATA:  CABG EXAM: CHEST  2 VIEW COMPARISON:  02/28/2017 FINDINGS: Improvement in bibasilar atelectasis. Small pleural effusions bilaterally. Negative for edema. No pneumothorax IMPRESSION: Improvement in bibasilar atelectasis and small pleural effusions. Electronically Signed   By: Marlan Palau M.D.  On: 03/01/2017 09:01    Assessment/Plan: S/P Procedure(s) (LRB): CORONARY ARTERY BYPASS GRAFTING (CABG) x 3 ON PUMP USING LEFT INTERNAL MAMMARY ARTERY TO LEFT ANTERIOR DESENDING CORNARY ARTERY, RIGHT GREATER SAPHENOUS VEIN TO LEFT CIRCUMFLEX ARTERY AND POSTERIOR DESENDING ARTERY. RIGHT GREATER SAPHENOUS VEIN OBTAINED VIA ENDOVEIN HARVEST. (N/A) TRANSESOPHAGEAL ECHOCARDIOGRAM (TEE) (N/A) Plan for discharge: see discharge orders hemodyn stable in sinus rhythm No new labs, BS well controlled   LOS: 9 days    [name] Cruz 03/02/2017  patient examined and medical record reviewed,agree with above note. Kathlee Nationseter Van Trigt III 03/02/2017

## 2017-03-02 NOTE — Progress Notes (Signed)
Patient is ready for discharge.Patient has all of his belongings. He is alert and oriented and VS are stable. Patient has had all of his discharge questions addressed and answered. Patient IV has been removed with catheter intact and no complications and he tolerated removal well. He has been DC'd from telemetry and box has been removed. He will leave unit by wheelchair and will mett his sister at the front entrance of the hospital. Patient will be transported home by his sister, Steward DroneBrenda who is here with him now.

## 2017-03-03 ENCOUNTER — Telehealth: Payer: Self-pay

## 2017-03-03 ENCOUNTER — Telehealth (HOSPITAL_COMMUNITY): Payer: Self-pay | Admitting: *Deleted

## 2017-03-03 NOTE — Telephone Encounter (Signed)
Spoke with sister, Steward DroneBrenda.   Transition Care Management Follow-up Telephone Call   Date discharged? 03/02/17   How have you been since you were released from the hospital? "He's doing what he wants"   Do you understand why you were in the hospital? yes   Do you understand the discharge instructions? yes   Where were you discharged to? Home.    Items Reviewed:  Medications reviewed: no, meds/list not available at time of call.   Allergies reviewed: no  Dietary changes reviewed: yes  Referrals reviewed: no   Functional Questionnaire:   Activities of Daily Living (ADLs):   He states they are independent in the following: ambulation, bathing and hygiene, feeding, continence, grooming, toileting and dressing States they require assistance with the following: None.    Any transportation issues/concerns?: no   Any patient concerns? Yes. Sister reports pt fell this morning without injury.    Confirmed importance and date/time of follow-up visits scheduled yes   Confirmed with patient if condition begins to worsen call PCP or go to the ER.  Patient was given the office number and encouraged to call back with question or concerns.  : yes

## 2017-03-06 NOTE — Telephone Encounter (Signed)
Notified mother, Dot, that patient's Hospital follow up appointment will be at 1030 on 03/11/17.  Dot verbalizes understanding and agreement with date and time.

## 2017-03-11 ENCOUNTER — Inpatient Hospital Stay: Payer: Medicare Other | Admitting: Family Medicine

## 2017-03-11 ENCOUNTER — Telehealth: Payer: Self-pay

## 2017-03-11 NOTE — Telephone Encounter (Signed)
Patient No Showed/ Canceled appt within 24 hours.   Please determine one of the following actions:   A. No follow-up necessary.  B. Follow-up urgent. Locate patient immediately.  C. Follow-up necessary. Contact patient and schedule visit in _______ days.  D. Follow-up advised. Contact patient and schedule visit in _______ weeks.    Please determine CHARGE or NO CHARGE.   Pt had 03/11/17 10:30 hospital f/u appt; pt called 03/11/17 at 10:10 to cancel appt due to no transportation. Pt has rescheduled for 03/14/17 but 10:30 appt today has not been filled.Please advise.

## 2017-03-11 NOTE — Telephone Encounter (Signed)
Copied from CRM [id number]. Topic: Quick Communication - Appointment Cancellation >> Mar 11, 2017 10:14 AM Everardo [place], [place], [place] wrote: Patient mother called to cancel appointment scheduled for 03-11-2017 @ 10:30am with Dr.Gutierrez Patient mother has  rescheduled their appointment.  Route to department's PEC pool.

## 2017-03-11 NOTE — Telephone Encounter (Signed)
Don't charge. Thanks.  

## 2017-03-14 ENCOUNTER — Ambulatory Visit (INDEPENDENT_AMBULATORY_CARE_PROVIDER_SITE_OTHER): Payer: Medicare Other | Admitting: Family Medicine

## 2017-03-14 ENCOUNTER — Encounter: Payer: Self-pay | Admitting: Family Medicine

## 2017-03-14 VITALS — BP 122/70 | HR 68 | Temp 97.9°F | Wt 174.0 lb

## 2017-03-14 DIAGNOSIS — E785 Hyperlipidemia, unspecified: Secondary | ICD-10-CM | POA: Diagnosis not present

## 2017-03-14 DIAGNOSIS — I10 Essential (primary) hypertension: Secondary | ICD-10-CM | POA: Diagnosis not present

## 2017-03-14 DIAGNOSIS — Z7289 Other problems related to lifestyle: Secondary | ICD-10-CM | POA: Diagnosis not present

## 2017-03-14 DIAGNOSIS — I4891 Unspecified atrial fibrillation: Secondary | ICD-10-CM | POA: Diagnosis not present

## 2017-03-14 DIAGNOSIS — D62 Acute posthemorrhagic anemia: Secondary | ICD-10-CM

## 2017-03-14 DIAGNOSIS — I2510 Atherosclerotic heart disease of native coronary artery without angina pectoris: Secondary | ICD-10-CM | POA: Diagnosis not present

## 2017-03-14 DIAGNOSIS — I214 Non-ST elevation (NSTEMI) myocardial infarction: Secondary | ICD-10-CM | POA: Diagnosis not present

## 2017-03-14 DIAGNOSIS — Z951 Presence of aortocoronary bypass graft: Secondary | ICD-10-CM | POA: Diagnosis not present

## 2017-03-14 DIAGNOSIS — R569 Unspecified convulsions: Secondary | ICD-10-CM

## 2017-03-14 DIAGNOSIS — F1090 Alcohol use, unspecified, uncomplicated: Secondary | ICD-10-CM

## 2017-03-14 LAB — CBC WITH DIFFERENTIAL/PLATELET
BASOS ABS: 0.1 10*3/uL (ref 0.0–0.1)
Basophils Relative: 1.1 % (ref 0.0–3.0)
EOS ABS: 0.4 10*3/uL (ref 0.0–0.7)
EOS PCT: 6.2 % — AB (ref 0.0–5.0)
HCT: 31.2 % — ABNORMAL LOW (ref 39.0–52.0)
HEMOGLOBIN: 10.4 g/dL — AB (ref 13.0–17.0)
LYMPHS ABS: 2.1 10*3/uL (ref 0.7–4.0)
Lymphocytes Relative: 29.3 % (ref 12.0–46.0)
MCHC: 33.2 g/dL (ref 30.0–36.0)
MCV: 93 fl (ref 78.0–100.0)
MONO ABS: 0.7 10*3/uL (ref 0.1–1.0)
Monocytes Relative: 9.5 % (ref 3.0–12.0)
NEUTROS PCT: 53.9 % (ref 43.0–77.0)
Neutro Abs: 3.8 10*3/uL (ref 1.4–7.7)
Platelets: 630 10*3/uL — ABNORMAL HIGH (ref 150.0–400.0)
RBC: 3.35 Mil/uL — AB (ref 4.22–5.81)
RDW: 14.2 % (ref 11.5–15.5)
WBC: 7.1 10*3/uL (ref 4.0–10.5)

## 2017-03-14 LAB — BASIC METABOLIC PANEL
BUN: 11 mg/dL (ref 6–23)
CALCIUM: 9.6 mg/dL (ref 8.4–10.5)
CHLORIDE: 105 meq/L (ref 96–112)
CO2: 25 mEq/L (ref 19–32)
Creatinine, Ser: 0.88 mg/dL (ref 0.40–1.50)
GFR: 93.31 mL/min (ref >60.00)
GLUCOSE: 92 mg/dL (ref 70–99)
POTASSIUM: 5.3 meq/L — AB (ref 3.5–5.1)
SODIUM: 139 meq/L (ref 135–145)

## 2017-03-14 NOTE — Patient Instructions (Addendum)
You are recovering well after recent surgery.  Continue current medicines.  Labs today. Congratulations on stopping alcohol! See below for upcoming appointments

## 2017-03-14 NOTE — Progress Notes (Signed)
BP 122/70 (BP Location: Left Arm, Patient Position: Sitting, Cuff Size: Normal)   Pulse 68   Temp 97.9 F (36.6 C) (Oral)   Wt 174 lb (78.9 kg)   SpO2 96%   BMI 31.83 kg/m    CC: hosp f/u visit Subjective:    Patient ID: [name] MileBilly J Toren, male    DOB: 1955/10/31, 62 y.o.   MRN: [id number]  HPI: [name] Cruz is a 62 y.o. male presenting on 03/14/2017 for Hospitalization Follow-up (Admitted to [facility] on 02/20/17, dx NSTEMI. Pt accompanied by mother. Provided d/c summary.)   Recent hospitalization for chest pain, found to have NSTEMI with 3v blockage by catheterization, troponin peaked at 19. On 02/24/2017 he underwent emergent 3v CABG ([facility]). Records reviewed. Seems he had postop afib with RVR that is controlled well with amiodarone. LVEF 25-50%. Since home feels well. Still has chest soreness but incision healing well. Denies fevers, cough, dyspnea. Mild drainage without redness at lower chest incision.   Cardiologist: Dr Eldridge DaceVaranasi - upcoming appts this month with CVTS Tyrone Sage([facility]) and cards Lowanda Foster([facility]).  Brings recent BP log which was reviewed: 102-111/60-70s, HR 60-70s.   Admit date: 02/20/2017 Discharge date: 03/02/2017 TCM f/u hospital phone call: completed 03/03/2017  Discharge Diagnoses:  Active Problems :   HTN (hypertension)   Seizures (HCC)   NSTEMI (non-ST elevated myocardial infarction) (HCC)   Atrial fibrillation with RVR (HCC)   S/P CABG x 3   Coronary artery disease involving native heart without angina pectoris  Discharged Condition: good  Relevant past medical, surgical, family and social history reviewed and updated as indicated. Interim medical history since our last visit reviewed. Allergies and medications reviewed and updated. Outpatient Medications Prior to Visit  Medication Sig Dispense Refill  . amiodarone (PACERONE) 200 MG tablet Take 1 tablet (200 mg total) by mouth daily. 30 tablet 1  . aspirin EC 325 MG EC tablet Take 1 tablet (325 mg total) by mouth  daily.    Marland Kitchen. atorvastatin (LIPITOR) 80 MG tablet TAKE 1 TABLET (80 MG TOTAL) BY MOUTH DAILY. FOR CHOLESTEROL 90 tablet 3  . carvedilol (COREG) 3.125 MG tablet Take 1 tablet (3.125 mg total) by mouth 2 (two) times daily with a meal. 60 tablet 1  . furosemide (LASIX) 40 MG tablet Take 1 tablet (40 mg total) by mouth daily. 5 tablet 0  . losartan (COZAAR) 25 MG tablet Take 1 tablet (25 mg total) by mouth daily. 30 tablet 1  . oxyCODONE (OXY IR/ROXICODONE) 5 MG immediate release tablet Take 1-2 tablets (5-10 mg total) by mouth every 6 (six) hours as needed for severe pain. 30 tablet 0  . potassium chloride SA (K-DUR,KLOR-CON) 20 MEQ tablet Take 1 tablet (20 mEq total) by mouth daily. 5 tablet 0   No facility-administered medications prior to visit.      Per HPI unless specifically indicated in ROS section below Review of Systems     Objective:    BP 122/70 (BP Location: Left Arm, Patient Position: Sitting, Cuff Size: Normal)   Pulse 68   Temp 97.9 F (36.6 C) (Oral)   Wt 174 lb (78.9 kg)   SpO2 96%   BMI 31.83 kg/m   Wt Readings from Last 3 Encounters:  03/14/17 174 lb (78.9 kg)  03/02/17 174 lb (78.9 kg)  01/28/17 172 lb 8 oz (78.2 kg)    Physical Exam  Constitutional: He appears well-developed and well-nourished. No distress.  HENT:  Mouth/Throat: Oropharynx is clear and moist. No oropharyngeal  exudate.  Cardiovascular: Normal rate, regular rhythm, normal heart sounds and intact distal pulses.  No murmur heard. Pulmonary/Chest: Effort normal and breath sounds normal. No respiratory distress. He has no wheezes. He has no rales. He exhibits tenderness.  Healing incision midline chest without surrounding erythema  Musculoskeletal: He exhibits no edema.  Skin: Skin is warm and dry. No rash noted.  Psychiatric: He has a normal mood and affect.  Nursing note and vitals reviewed.     Assessment & Plan:   Problem List Items Addressed This Visit    Atrial fibrillation with RVR  (HCC)    Now controlled with amiodarone.       CAD (coronary artery disease), native coronary artery   Habitual alcohol use    Off alcohol since hospitalization after NSTEMI Encouraged full abstinence.      HLD (hyperlipidemia)    Chronic, stable on lipitor 80mg  daily (same dose as prior to NSTEMI). Latest LDL 92  Lab Results  Component Value Date   LDLCALC 92 01/28/2017        HTN (hypertension)    Chronic, stable on current regimen. Continue.       Relevant Orders   Basic metabolic panel (Completed)   NSTEMI (non-ST elevated myocardial infarction) (HCC) - Primary    Cath showed blockages s/p 3v CABG, healing well from this, vitals seem stable. Encouraged f/u with cards and CT surgery as planned.       S/P CABG x 3    Appreciate CVTS care. Aspirin dose has been increased to 325mg  daily.       Seizures (HCC)    H/o isolated seizure 2014 during hospitalization after abrupt discontinuation of alcohol in habitual user, had been on phenytoin since. Never followed up with neurology. It seems this medication has been discontinued during latest hospitalization. Will remain off and monitor for recurrent seizures. He has now also stopped drinking alcohol.        Other Visit Diagnoses    Postoperative anemia due to acute blood loss       Relevant Orders   CBC with Differential/Platelet (Completed)       No orders of the defined types were placed in this encounter.  Orders Placed This Encounter  Procedures  . CBC with Differential/Platelet  . Basic metabolic panel    Follow up plan: Return in about 6 months (around 09/14/2017) for follow up visit.  [name], MD

## 2017-03-15 ENCOUNTER — Other Ambulatory Visit: Payer: Self-pay | Admitting: Family Medicine

## 2017-03-15 NOTE — Assessment & Plan Note (Signed)
Chronic, stable on current regimen. Continue.  

## 2017-03-15 NOTE — Assessment & Plan Note (Signed)
Off alcohol since hospitalization after NSTEMI Encouraged full abstinence.

## 2017-03-15 NOTE — Assessment & Plan Note (Signed)
Chronic, stable on lipitor 80mg  daily (same dose as prior to NSTEMI). Latest LDL 92  Lab Results  Component Value Date   LDLCALC 92 01/28/2017

## 2017-03-15 NOTE — Assessment & Plan Note (Signed)
H/o isolated seizure 2014 during hospitalization after abrupt discontinuation of alcohol in habitual user, had been on phenytoin since. Never followed up with neurology. It seems this medication has been discontinued during latest hospitalization. Will remain off and monitor for recurrent seizures. He has now also stopped drinking alcohol.

## 2017-03-15 NOTE — Assessment & Plan Note (Addendum)
Appreciate CVTS care. Aspirin dose has been increased to 325mg  daily.

## 2017-03-15 NOTE — Assessment & Plan Note (Signed)
Now controlled with amiodarone.

## 2017-03-15 NOTE — Assessment & Plan Note (Signed)
Cath showed blockages s/p 3v CABG, healing well from this, vitals seem stable. Encouraged f/u with cards and CT surgery as planned.

## 2017-03-20 ENCOUNTER — Ambulatory Visit: Payer: Medicare Other | Admitting: Cardiology

## 2017-03-20 ENCOUNTER — Encounter: Payer: Self-pay | Admitting: Cardiology

## 2017-03-20 ENCOUNTER — Ambulatory Visit (INDEPENDENT_AMBULATORY_CARE_PROVIDER_SITE_OTHER): Payer: Self-pay

## 2017-03-20 VITALS — BP 124/76 | HR 65 | Ht 63.0 in | Wt 172.8 lb

## 2017-03-20 DIAGNOSIS — I10 Essential (primary) hypertension: Secondary | ICD-10-CM | POA: Diagnosis not present

## 2017-03-20 DIAGNOSIS — I4891 Unspecified atrial fibrillation: Secondary | ICD-10-CM | POA: Diagnosis not present

## 2017-03-20 DIAGNOSIS — Z4802 Encounter for removal of sutures: Secondary | ICD-10-CM

## 2017-03-20 MED ORDER — CARVEDILOL 3.125 MG PO TABS: 3.1250 mg | ORAL_TABLET | Freq: Two times a day (BID) | ORAL | 1 refills | Status: DC

## 2017-03-20 MED ORDER — LOSARTAN POTASSIUM 25 MG PO TABS: 25.0000 mg | ORAL_TABLET | Freq: Every day | ORAL | 1 refills | Status: DC

## 2017-03-20 MED ORDER — ATORVASTATIN CALCIUM 80 MG PO TABS: ORAL_TABLET | ORAL | 1 refills | Status: DC

## 2017-03-20 NOTE — Patient Instructions (Addendum)
Medication Instructions:  Your physician recommends that you continue on your current medications as directed. Please refer to the Current Medication list given to you today. The Carvedilol is to be taking twice a day  Labwork: 04/27/16:  FASTING LIPID & LFT (nothing to eat or drink after midnight on the night before)  Testing/Procedures: None ordered  Follow-Up: Your physician recommends that you schedule a follow-up appointment in: 2-3 MONTHS WITH DR. VARANASI   Any Other Special Instructions Will Be Listed Below (If Applicable).     If you need a refill on your cardiac medications before your next appointment, please call your pharmacy.

## 2017-03-20 NOTE — Progress Notes (Signed)
03/20/2017 HALSTON KINTZ   August 24, 1955  [id number]  Primary Physician [name], MD Primary Cardiologist: Dr. Eldridge Dace   Reason for Visit/CC: [facility] f/u for CAD s/p NSTEMI/ CABG x 3  HPI:  62 y.o. male with history of hemorrhagic stroke (thought to be secondary to uncontrolled hypertension now with residual cognitive impairment), hypertension, hyperlipidemia who presented to the  [facility] ED on 02/21/17 with complaints of CP and dyspnea. He was noted to be in atrial fibrillation w/ RVR. Initial ECG was notable for diffuse ST depressions and ST elevation in aVR. He ruled in for NSTEMI. Initial troponin was 0.68 but rose to 19.6. He was started on IV heparin and IV Cardizem drip and taken to the cath lab where he was found to has severe 3 vessel disease noted, 60% LMCA stenosis, chronic total occlusion of ostial LAD, 70% proximal  LCX lesion, and 70-90% proximal, mid, and distal RCA. Due to ongoing CP IABP was placed. CT surgery consulted and CABG was recommended.   On 02/24/17, he underwent emergent coronary artery bypass grafting x3, performed by Dr. Tyrone Sage, with the left internal mammary to the left anterior descending coronary artery, reverse saphenous vein graft to the distal right coronary artery, reverse saphenous vein graft to the circumflex coronary artery. Echo showed EF of 45-50% but LV gram estimate at time of cath was 25-30%. He did well post op. He was extubated, weaned off pressors and IABP. He required amiodarone for afib and was transitioned to PO and converted to NSR. Given previous h/o hemorrhagic stroke, no OAC was initiated.   He presents to clinic today for f/u. He is here with his wife. He has done fairly well from a cardiac standpoint. No recurrent angina. No dyspnea. He denies palpitations. EKG shows NSR. He reports med compliance, however he has only been taking Coreg once daily by mistake, as apposed to BID. He continues to have mild LEE and takes Lasix daily along  with Kdur. BP is 124/76. HR 65 bpm.    From a surgical standpoint, he notes incisional pain and also has an opened wound (chest tube site), which has been draining yellow/ green pus. Mildly erythematous around the perimeter. However he denies fever and chills. His daughter has been bandaging it and keeping it dry. They have not yet notified Dr. Dennie Maizes office. They do not have f/u with him until another 10 days.    Cardiac Studies:  2D Echo 02/21/17 Study Conclusions  - Left ventricle: The cavity size was normal. Wall thickness was   increased in a pattern of mild LVH. Systolic function was mildly   reduced. The estimated ejection fraction was in the range of 45%   to 50%. Diffuse hypokinesis. There is hypokinesis of the   anteroseptal myocardium. There was no evidence of elevated   ventricular filling pressure by Doppler parameters. - Mitral valve: There was mild regurgitation. ____________________ [facility] 02/21/17 Conclusion   Conclusions: 1. Severe 3-vessel coronary artery disease, including 60% LMCA stenosis, chronic total occlusion of ostial LAD, 70% proximal LCx lesion, and sequential 70-99% proximal, mid, and distal RCA lesions. Mid and distal LAD fill via right-to-left collaterals. 2. Severely reduced left ventricular contraction (LVEF 25-30%). 3. Mildly elevated left ventricular filling pressure (LVEDP 20 mmHg). 4. Successful placement of 40 mL intraaortic balloon pump via the right common femoral artery.  Recommendations: 1. Cardiac surgery consultation for CABG. 2. Transfer to ICU pending surgical evaluation. 3. Begin heparin infusion 2 hours after right radial sheath removal.  4. Portable chest x-ray to verify IABP placement when the patient reaches ICU. 5. Aggressive secondary prevention.  _____________________ CABG 02/24/17 SURGICAL PROCEDURE:  Coronary artery bypass grafting x3 with the left internal mammary to the left anterior descending coronary artery, reverse  saphenous vein graft to the distal right coronary artery, reverse saphenous vein graft to the circumflex coronary artery with right greater saphenous endoscopic vein harvesting from the thigh and upper calf. Operative TEE 2/11/9 Conclusions   Result status: Final result   Aortic valve: The valve is trileaflet. No stenosis. No regurgitation.  Mitral valve: No leaflet thickening and calcification present. Mild to moderate regurgitation.  Right ventricle: Normal cavity size, wall thickness and ejection fraction.       Current Meds  Medication Sig  . amiodarone (PACERONE) 200 MG tablet Take 1 tablet (200 mg total) by mouth daily.  Marland Kitchen aspirin EC 325 MG EC tablet Take 1 tablet (325 mg total) by mouth daily.  Marland Kitchen atorvastatin (LIPITOR) 80 MG tablet TAKE 1 TABLET (80 MG TOTAL) BY MOUTH DAILY. FOR CHOLESTEROL  . carvedilol (COREG) 3.125 MG tablet Take 1 tablet (3.125 mg total) by mouth 2 (two) times daily with a meal.  . furosemide (LASIX) 40 MG tablet Take 40 mg by mouth.  . losartan (COZAAR) 25 MG tablet Take 1 tablet (25 mg total) by mouth daily.  Marland Kitchen oxyCODONE (OXY IR/ROXICODONE) 5 MG immediate release tablet Take 1-2 tablets (5-10 mg total) by mouth every 6 (six) hours as needed for severe pain.  . potassium chloride SA (K-DUR,KLOR-CON) 20 MEQ tablet Take 20 mEq by mouth once.  . [DISCONTINUED] atorvastatin (LIPITOR) 80 MG tablet TAKE 1 TABLET (80 MG TOTAL) BY MOUTH DAILY. FOR CHOLESTEROL  . [DISCONTINUED] carvedilol (COREG) 3.125 MG tablet Take 1 tablet (3.125 mg total) by mouth 2 (two) times daily with a meal.  . [DISCONTINUED] losartan (COZAAR) 25 MG tablet Take 1 tablet (25 mg total) by mouth daily.   No Known Allergies Past Medical History:  Diagnosis Date  . Cognitive impairment 2007   after stroke, saw rehab but told to stop because was too upsetting to him  . History of chicken pox   . HLD (hyperlipidemia)   . HTN (hypertension)   . NSTEMI (non-ST elevated myocardial  infarction) (HCC) 02/21/2017  . Obesity   . Stroke, hemorrhagic (HCC) 2007   thought 2/2 HTN (240sbp); residual cognitive impairment, loss of R peripheral field, no driving   Family History  Problem Relation Age of Onset  . Alzheimer's disease Maternal Grandfather   . Cancer Mother        lymphoma  . Alcohol abuse Father        smoker  . Coronary artery disease Neg Hx   . Stroke Neg Hx   . Diabetes Neg Hx    Past Surgical History:  Procedure Laterality Date  . ANKLE SURGERY  1990s   right foot with plate and screws  . CORONARY ARTERY BYPASS GRAFT N/A 02/24/2017   3v Procedure: CORONARY ARTERY BYPASS GRAFTING (CABG) x 3 ON PUMP USING LEFT INTERNAL MAMMARY ARTERY TO LEFT ANTERIOR DESENDING CORNARY ARTERY, RIGHT GREATER SAPHENOUS VEIN TO LEFT CIRCUMFLEX ARTERY AND POSTERIOR DESENDING ARTERY. RIGHT GREATER SAPHENOUS VEIN OBTAINED VIA ENDOVEIN HARVEST.;  Surgeon: Delight Ovens, MD  . IABP INSERTION N/A 02/21/2017   Procedure: IABP Insertion;  Surgeon: Yvonne Kendall, MD;  Location: [facility] INVASIVE CV LAB;  Service: Cardiovascular;  Laterality: N/A;  . LEFT HEART CATH AND CORONARY ANGIOGRAPHY N/A 02/21/2017  Procedure: LEFT HEART CATH AND CORONARY ANGIOGRAPHY;  Surgeon: Yvonne KendallEnd, Christopher, MD;  Location: [facility] INVASIVE CV LAB;  Service: Cardiovascular;  Laterality: N/A;  . TEE WITHOUT CARDIOVERSION N/A 02/24/2017   Procedure: TRANSESOPHAGEAL ECHOCARDIOGRAM (TEE);  Surgeon: Delight OvensGerhardt, Edward B, MD;  [facility];  Service: Open Heart Surgery;  Laterality: N/A;   Social History   Socioeconomic History  . Marital status: Single    Spouse name: Not on file  . Number of children: Not on file  . Years of education: Not on file  . Highest education level: Not on file  Social Needs  . Financial resource strain: Not on file  . Food insecurity - worry: Not on file  . Food insecurity - inability: Not on file  . Transportation needs - medical: Not on file  . Transportation needs - non-medical: Not  on file  Occupational History  . Not on file  Tobacco Use  . Smoking status: Never Smoker  . Smokeless tobacco: Former Engineer, waterUser  Substance and Sexual Activity  . Alcohol use: Yes    Alcohol/week: 8.4 oz    Types: 12 Cans of beer, 2 Shots of liquor per week    Comment: regular alcohol use  . Drug use: No  . Sexual activity: No  Other Topics Concern  . Not on file  Social History Narrative   Caffeine: 1 pot coffee   Lives with oldest sister in [place]   Occupation: disabled, was Corporate investment bankerconstruction worker   Does not drive.   Activity: no regular exercise   Diet: good water, drinks V8 juice     Review of Systems: General: negative for chills, fever, night sweats or weight changes.  Cardiovascular: negative for chest pain, dyspnea on exertion, edema, orthopnea, palpitations, paroxysmal nocturnal dyspnea or shortness of breath Dermatological: negative for rash Respiratory: negative for cough or wheezing Urologic: negative for hematuria Abdominal: negative for nausea, vomiting, diarrhea, bright red blood per rectum, melena, or hematemesis Neurologic: negative for visual changes, syncope, or dizziness All other systems reviewed and are otherwise negative except as noted above.   Physical Exam:  Blood pressure 124/76, pulse 65, height 5\' 3"  (1.6 m), weight 172 lb 12.8 oz (78.4 kg).  General appearance: alert, cooperative and no distress Neck: no carotid bruit and no JVD Lungs: clear to auscultation bilaterally Heart: regular rate and rhythm, S1, S2 normal, no murmur, click, rub or gallop Extremities: trace bilateral LEE Pulses: 2+ and symmetric Skin: Skin color, texture, turgor normal. No rashes or lesions Neurologic: Grossly normal  EKG NSR 65 bpm -- personally reviewed   ASSESSMENT AND PLAN:   1. Multivessel CAD: s/p NSTEMI 02/21/17. Severe 3V CAD noted on cath, treated w/ CABG x 3 w/ LIMA-LAD, SVG-dis RCA and SVG to LCx. Stable w/o anginal symptoms. Continue medical therapy for  secondary prevention, ASA, statin and BB.   2. Systolic HF: variable EF readings. LV gram during cath noted EF of 25-30%. Echo same day of cath noted 45-50%. TEE during CABG 2/11/9 noted low normal LVEF, 50-55%. Continue BB and ARB therapy.   3. PAF: maintaining NSR w/ low dose amiodarone. No OAC given h/o hemorraghic stroke  4. H/o Hemorraghic Stroke: thought to be secondary to uncontrolled hypertension now with residual cognitive impairment. No a/c for this reason.   5. HTN: controlled on current regimen, however pt only taking Coreg once daily. Will increase to BID dosing.   6. HLD: recent LDL 92 mg/dl. High dose statin therapy, Lipitor 80 mg recently started. Recheck  FLP and HFTs in 6 weeks. Goal LDL <70 mg/dL. If not at goal in 6 weeks, can add Zetia.   7. Post Op Wound: pt has an opened wound (chest tube site), which has been draining yellow/ green pus. Mildly erythematous around the perimeter. However he denies fever and chills. His daughter has been bandaging it and keeping it dry. They have not yet notified Dr. Dennie Maizes office. They do not have f/u with him until another 10 days. We have notified Dr. Dennie Maizes office today and they have agreed to have an RN look at it today to ensure no infection and they will give instruction regarding further wound care. Pt agrees to go over to surgical office after they level here.   Follow-Up w/ Dr. Tyrone Sage next week and Dr. Eldridge Dace in 2 months.   Joelle Flessner Delmer Islam, MHS [facility] 03/20/2017 11:23 AM

## 2017-03-31 ENCOUNTER — Other Ambulatory Visit: Payer: Self-pay | Admitting: Cardiothoracic Surgery

## 2017-03-31 ENCOUNTER — Ambulatory Visit
Admission: RE | Admit: 2017-03-31 | Discharge: 2017-03-31 | Disposition: A | Discharge status: Home / Self Care | Payer: Medicare Other | Source: Ambulatory Visit | Attending: Cardiothoracic Surgery | Admitting: Cardiothoracic Surgery

## 2017-03-31 ENCOUNTER — Ambulatory Visit (INDEPENDENT_AMBULATORY_CARE_PROVIDER_SITE_OTHER): Payer: Self-pay | Admitting: Surgical

## 2017-03-31 VITALS — BP 130/84 | HR 69 | Resp 20 | Ht 63.0 in | Wt 168.0 lb

## 2017-03-31 DIAGNOSIS — R0789 Other chest pain: Secondary | ICD-10-CM | POA: Diagnosis not present

## 2017-03-31 DIAGNOSIS — I2510 Atherosclerotic heart disease of native coronary artery without angina pectoris: Secondary | ICD-10-CM

## 2017-03-31 DIAGNOSIS — Z951 Presence of aortocoronary bypass graft: Secondary | ICD-10-CM

## 2017-03-31 NOTE — Patient Instructions (Signed)
Encouraged to continue to increase ambulation.  Discussed activity progression and restrictions.

## 2017-03-31 NOTE — Progress Notes (Signed)
[address]       [place] [id number]             [phone]      [name] MylarBilly J Pilger Cruz Medical Record #[id number] Date of Birth: Oct 02, 1955  Referring: [name], [name], MD Primary Care: [name], [name], MD Primary Cardiologist: [name], MD   Chief Complaint:   POST OP FOLLOW UP  DATE OF PROCEDURE:  02/24/2017 DATE OF DISCHARGE:                              OPERATIVE REPORT   PREOPERATIVE DIAGNOSES:  Coronary occlusive disease with recent non-ST- elevation myocardial infarction, intraaortic balloon pump in place.  POSTOPERATIVE DIAGNOSES:  Coronary occlusive disease with recent non-ST- elevation myocardial infarction, intraaortic balloon pump in place.  SURGICAL PROCEDURE:  Coronary artery bypass grafting x3 with the left internal mammary to the left anterior descending coronary artery, reverse saphenous vein graft to the distal right coronary artery, reverse saphenous vein graft to the circumflex coronary artery with right greater saphenous endoscopic vein harvesting from the thigh and upper calf.  SURGEON:  [name], MD.  FIRST ASSISTANT:  [name], [name].    History of Present Illness:    Patient is a 62 year old male status post the above described procedure seen in the office on today's date and routine postsurgical follow-up.  Overall he reports that he is doing well.  He denies chest pain or shortness of breath.  He denies fevers, chills or other significant constitutional symptoms.  He did mow his lawn one time without any problem.  I did discourage him from doing this until he has further healed.  He does not drive due to alcoholism and loss of his license so that is currently not an issue.  He did have alcohol last Friday which is the first time since his hospitalization.      Past Medical History:  Diagnosis Date  . Cognitive impairment 2007   after stroke, saw rehab but told to stop because was too  upsetting to him  . History of chicken pox   . HLD (hyperlipidemia)   . HTN (hypertension)   . NSTEMI (non-ST elevated myocardial infarction) (HCC) 02/21/2017  . Obesity   . Stroke, hemorrhagic (HCC) 2007   thought 2/2 HTN (240sbp); residual cognitive impairment, loss of R peripheral field, no driving     Social History   Tobacco Use  Smoking Status Never Smoker  Smokeless Tobacco Former NeurosurgeonUser    Social History   Substance and Sexual Activity  Alcohol Use Yes  . Alcohol/week: 8.4 oz  . Types: 12 Cans of beer, 2 Shots of liquor per week   Comment: regular alcohol use     No Known Allergies  Current Outpatient Medications  Medication Sig Dispense Refill  . amiodarone (PACERONE) 200 MG tablet Take 1 tablet (200 mg total) by mouth daily. 30 tablet 1  . aspirin EC 325 MG EC tablet Take 1 tablet (325 mg total) by mouth daily.    Marland Kitchen. atorvastatin (LIPITOR) 80 MG tablet TAKE 1 TABLET (80 MG TOTAL) BY MOUTH DAILY. FOR CHOLESTEROL 90 tablet 1  . carvedilol (COREG) 3.125 MG tablet Take 1 tablet (3.125 mg total) by mouth 2 (two) times daily with a meal. 1810 tablet 1  . furosemide (LASIX) 40 MG tablet Take 40 mg by mouth.    . losartan (COZAAR) 25 MG tablet Take  1 tablet (25 mg total) by mouth daily. 90 tablet 1  . oxyCODONE (OXY IR/ROXICODONE) 5 MG immediate release tablet Take 1-2 tablets (5-10 mg total) by mouth every 6 (six) hours as needed for severe pain. 30 tablet 0  . potassium chloride SA (K-DUR,KLOR-CON) 20 MEQ tablet Take 20 mEq by mouth once.     No current facility-administered medications for this visit.        Physical Exam: BP 130/84   Pulse 69   Resp 20   Ht 5\' 3"  (1.6 m)   Wt 168 lb (76.2 kg)   SpO2 98% Comment: RA  BMI 29.76 kg/m   General appearance: alert, cooperative and no distress Heart: regular rate and rhythm Lungs: clear to auscultation bilaterally Abdomen: soft, non-tender; bowel sounds normal; no masses,  no organomegaly Extremities: extremities  normal, atraumatic, no cyanosis or edema Wound: 2 very tiny areas of superficial skin separation on the sternal wound without evidence of infection otherwise healing well   Diagnostic Studies & Laboratory data:     Recent Radiology Findings:   Dg Chest 2 View  Result Date: 03/31/2017 CLINICAL DATA:  Chest soreness. EXAM: CHEST - 2 VIEW COMPARISON:  03/01/2017 FINDINGS: Interval improvement in basilar aeration with resolution of the small pleural effusions seen previously. No edema or focal airspace consolidation on today's exam. Cardiopericardial silhouette is at upper limits of normal for size. Patient is status post CABG. The visualized bony structures of the thorax are intact. IMPRESSION: No acute findings on today's study with improved aeration at the bases and resolution of the small effusions seen previously. Electronically Signed   By: Kennith Center M.D.   On: 03/31/2017 12:50      Recent Lab Findings: Lab Results  Component Value Date   WBC 7.1 03/14/2017   HGB 10.4 (L) 03/14/2017   HCT 31.2 (L) 03/14/2017   PLT 630.0 (H) 03/14/2017   GLUCOSE 92 03/14/2017   CHOL 179 01/28/2017   TRIG 118.0 01/28/2017   HDL 62.50 01/28/2017   LDLDIRECT 122.0 07/22/2016   LDLCALC 92 01/28/2017   ALT 21 02/23/2017   AST 35 02/23/2017   NA 139 03/14/2017   K 5.3 (H) 03/14/2017   CL 105 03/14/2017   CREATININE 0.88 03/14/2017   BUN 11 03/14/2017   CO2 25 03/14/2017   TSH 2.054 02/23/2017   INR 1.32 02/24/2017   HGBA1C 5.5 02/23/2017      Assessment / Plan: Patient continues to do well in his postsurgical recovery.  I encouraged him to keep his incisions clean and dry and they appear to be healing well without any current evidence of infection.  I encouraged him to not mow his lawn for another 2 months as his sternum continues to heal.  Encouraged him to seek further help with his alcoholism should he return to drinking.  We will see him again on a as needed basis for any surgically  related issues or at request.          Rowe Clack, PA-C 03/31/2017 1:51 PM

## 2017-04-12 ENCOUNTER — Other Ambulatory Visit: Payer: Self-pay | Admitting: Family Medicine

## 2017-05-01 ENCOUNTER — Other Ambulatory Visit: Payer: Medicare Other

## 2017-05-01 DIAGNOSIS — I10 Essential (primary) hypertension: Secondary | ICD-10-CM

## 2017-05-01 DIAGNOSIS — I4891 Unspecified atrial fibrillation: Secondary | ICD-10-CM | POA: Diagnosis not present

## 2017-05-01 LAB — HEPATIC FUNCTION PANEL
ALBUMIN: 4.3 g/dL (ref 3.6–4.8)
ALT: 12 IU/L (ref 0–44)
AST: 13 IU/L (ref 0–40)
Alkaline Phosphatase: 94 IU/L (ref 39–117)
Bilirubin, Direct: 0.06 mg/dL (ref 0.00–0.40)
Total Protein: 6.4 g/dL (ref 6.0–8.5)

## 2017-05-01 LAB — LIPID PANEL
CHOL/HDL RATIO: 4 ratio (ref 0.0–5.0)
Cholesterol, Total: 169 mg/dL (ref 100–199)
HDL: 42 mg/dL (ref >39)
LDL Calculated: 110 mg/dL — ABNORMAL HIGH (ref 0–99)
TRIGLYCERIDES: 86 mg/dL (ref 0–149)
VLDL CHOLESTEROL CAL: 17 mg/dL (ref 5–40)

## 2017-05-05 ENCOUNTER — Telehealth: Payer: Self-pay | Admitting: Cardiology

## 2017-05-05 NOTE — Telephone Encounter (Signed)
-----   Message from Allayne ButcherBrittainy M Simmons, [place]-C sent at 05/01/2017  4:09 PM EDT ----- LDL (bad cholesterol) has increased since last checked 3 months ago, up from 92>>110. Given CAD, we recommend LDL < 70 mg. This will reduce risk of developing new blockages in heart arteries.  Make sure pt is fully compliant with Lipitor 80 mg nightly. If not compliant, he needs to improve compliance. If unable to tolerate due to side effects, then he will need to let us know so that adjustments can be made. If he has been fully compliant, then add Zetia 10 mg daily. He will need repeat FLP and HFTs again in 8 weeks to reassess.

## 2017-05-05 NOTE — Telephone Encounter (Signed)
Returned pt's call, left message for pt to call back 

## 2017-05-05 NOTE — Telephone Encounter (Signed)
New message  Pt sister verbalzied that she is calling for RN  To go over pt labs

## 2017-05-16 ENCOUNTER — Encounter: Payer: Self-pay | Admitting: *Deleted

## 2017-05-16 ENCOUNTER — Telehealth: Payer: Self-pay | Admitting: *Deleted

## 2017-05-16 DIAGNOSIS — Z79899 Other long term (current) drug therapy: Secondary | ICD-10-CM

## 2017-05-16 MED ORDER — EZETIMIBE 10 MG PO TABS: 10.0000 mg | ORAL_TABLET | Freq: Every day | ORAL | 3 refills | Status: DC

## 2017-05-16 NOTE — Telephone Encounter (Signed)
-----   Message from Allayne Butcher, [place] sent at 05/01/2017  4:09 PM EDT ----- LDL (bad cholesterol) has increased since last checked 3 months ago, up from 92>>110. Given CAD, we recommend LDL < 70 mg. This will reduce risk of developing new blockages in heart arteries.  Make sure pt is fully compliant with Lipitor 80 mg nightly. If not compliant, he needs to improve compliance. If unable to tolerate due to side effects, then he will need to let us know so that adjustments can be made. If he has been fully compliant, then add Zetia 10 mg daily. He will need repeat FLP and HFTs again in 8 weeks to reassess.

## 2017-05-28 NOTE — Progress Notes (Signed)
Cardiology Office Note   Date:  05/29/2017   ID:  [name], Cruz [birth year], MRN [id number]  PCP:  [name] Boyden, MD    No chief complaint on file.  CAD  Wt Readings from Last 3 Encounters:  05/29/17 163 lb (73.9 kg)  03/31/17 168 lb (76.2 kg)  03/20/17 172 lb 12.8 oz (78.4 kg)       History of Present Illness: [name] Cruz is a 63 y.o. male  with history ofhemorrhagic stroke (thought to be secondary to uncontrolled hypertension now with residual cognitive impairment), hypertension, hyperlipidemia who presented to the  [facility] ED on 02/21/17 with complaints of CP and dyspnea. He was noted to be in atrial fibrillation w/ RVR. Initial ECG was notable for diffuse ST depressions and ST elevation in aVR. He ruled in for NSTEMI. Initial troponin was 0.68 but rose to 19.6. He was started on IV heparin and IV Cardizem drip and taken to the cath lab where he was found to has severe 3 vessel disease noted, 60% LMCA stenosis, chronic total occlusion of ostial LAD, 70% proximal LCX lesion, and 70-90% proximal, mid, and distal RCA. Due to ongoing CP IABP was placed. CT surgery consulted and CABG was recommended.   On 02/24/17, he underwent emergent coronary artery bypass grafting x3, performed by Dr. Tyrone Sage, with the left internal mammary to the left anterior descending coronary artery, reverse saphenous vein graft to the distal right coronary artery, reverse saphenous vein graft to the circumflex coronary artery. Echo showed EF of 45-50% but LV gram estimate at time of cath was 25-30%. He did well post op. He was extubated, weaned off pressors and IABP. He required amiodarone for afib and was transitioned to PO and converted to NSR. Given previous h/o hemorrhagic stroke, no OAC was initiated.   Since the last visit, he has done well.    Denies : Chest pain. Dizziness. Leg edema. Nitroglycerin use. Orthopnea. Palpitations. Paroxysmal nocturnal dyspnea. Shortness of breath. Syncope.    Chest tube site has healed well.  No further drainage.  He is walking and working in the yard.     Past Medical History:  Diagnosis Date  . Cognitive impairment 2007   after stroke, saw rehab but told to stop because was too upsetting to him  . History of chicken pox   . HLD (hyperlipidemia)   . HTN (hypertension)   . NSTEMI (non-ST elevated myocardial infarction) (HCC) 02/21/2017  . Obesity   . Stroke, hemorrhagic (HCC) 2007   thought 2/2 HTN (240sbp); residual cognitive impairment, loss of R peripheral field, no driving    Past Surgical History:  Procedure Laterality Date  . ANKLE SURGERY  1990s   right foot with plate and screws  . CORONARY ARTERY BYPASS GRAFT N/A 02/24/2017   3v Procedure: CORONARY ARTERY BYPASS GRAFTING (CABG) x 3 ON PUMP USING LEFT INTERNAL MAMMARY ARTERY TO LEFT ANTERIOR DESENDING CORNARY ARTERY, RIGHT GREATER SAPHENOUS VEIN TO LEFT CIRCUMFLEX ARTERY AND POSTERIOR DESENDING ARTERY. RIGHT GREATER SAPHENOUS VEIN OBTAINED VIA ENDOVEIN HARVEST.;  Surgeon: Delight Ovens, MD  . IABP INSERTION N/A 02/21/2017   Procedure: IABP Insertion;  Surgeon: Yvonne Kendall, MD;  [facility] IN[facility];  Service: Cardiovascular;  Laterality: N/A;  . LEFT HEART CATH AND CORONARY ANGIOGRAPHY N/A 02/21/2017   Procedure: LEFT HEART CATH AND CORONARY ANGIOGRAPHY;  Surgeon: Yvonne Kendall, MD;  Location: [facility] INVASIVE CV LAB;  Service: Cardiovascular;  Laterality: N/A;  . TEE WITHOUT CARDIOVERSION N/A 02/24/2017  Procedure: TRANSESOPHAGEAL ECHOCARDIOGRAM (TEE);  Surgeon: Delight Ovens, MD;  Location: [facility] OR;  Service: Open Heart Surgery;  Laterality: N/A;     Current Outpatient Medications  Medication Sig Dispense Refill  . amiodarone (PACERONE) 200 MG tablet Take 1 tablet (200 mg total) by mouth daily. 30 tablet 1  . aspirin EC 325 MG EC tablet Take 1 tablet (325 mg total) by mouth daily.    Marland Kitchen atorvastatin (LIPITOR) 80 MG tablet TAKE 1 TABLET (80 MG TOTAL) BY MOUTH  DAILY. FOR CHOLESTEROL 90 tablet 1  . carvedilol (COREG) 3.125 MG tablet Take 1 tablet (3.125 mg total) by mouth 2 (two) times daily with a meal. 1810 tablet 1  . ezetimibe (ZETIA) 10 MG tablet Take 1 tablet (10 mg total) by mouth daily. 90 tablet 3  . furosemide (LASIX) 40 MG tablet Take 40 mg by mouth.    . losartan (COZAAR) 25 MG tablet Take 1 tablet (25 mg total) by mouth daily. 90 tablet 1  . oxyCODONE (OXY IR/ROXICODONE) 5 MG immediate release tablet Take 1-2 tablets (5-10 mg total) by mouth every 6 (six) hours as needed for severe pain. 30 tablet 0  . phenytoin (DILANTIN) 100 MG ER capsule Take 100 mg by mouth 3 (three) times daily.  3  . potassium chloride SA (K-DUR,KLOR-CON) 20 MEQ tablet Take 20 mEq by mouth once.     No current facility-administered medications for this visit.     Allergies:   Patient has no known allergies.    Social History:  The patient  reports that he has never smoked. He has quit using smokeless tobacco. He reports that he drinks about 8.4 oz of alcohol per week. He reports that he does not use drugs.   Family History:  The patient's family history includes Alcohol abuse in his father; Alzheimer's disease in his maternal grandfather; Cancer in his mother.    ROS:  Please see the history of present illness.   Otherwise, review of systems are positive for hair growth at his CABG site.   All other systems are reviewed and negative.    PHYSICAL EXAM: VS:  BP 136/90   Pulse 72   Ht  (1.575 m)   Wt 163 lb (73.9 kg)   SpO2 99%   BMI 29.81 kg/m  , BMI Body mass index is 29.81 kg/m. GEN: Well nourished, well developed, in no acute distress  HEENT: normal  Neck: no JVD, carotid bruits, or masses Cardiac: RRR; no murmurs, rubs, or gallops,no edema  Respiratory:  clear to auscultation bilaterally, normal work of breathing GI: soft, nontender, nondistended, + BS MS: no deformity or atrophy  Skin: warm and dry, no rash Neuro:  Strength and sensation  are intact Psych: euthymic mood, full affect    Recent Labs: 02/23/2017: TSH 2.054 02/25/2017: Magnesium 1.9 03/14/2017: BUN 11; Creatinine, Ser 0.88; Hemoglobin 10.4; Platelets 630.0; Potassium 5.3; Sodium 139 05/01/2017: ALT 12   Lipid Panel    Component Value Date/Time   CHOL 169 05/01/2017 0821   TRIG 86 05/01/2017 0821   HDL 42 05/01/2017 0821   CHOLHDL 4.0 05/01/2017 0821   CHOLHDL 3 01/28/2017 0752   VLDL 23.6 01/28/2017 0752   LDLCALC 110 (H) 05/01/2017 0821   LDLDIRECT 122.0 07/22/2016 0959     Other studies Reviewed: Additional studies/ records that were reviewed today with results demonstrating: hospital records reviewed.   ASSESSMENT AND PLAN:  1. CAD: No angina. COntinue aggressive secondary prevention.  2. Systolic dysfunction:  LV function had improved by repeat echo.  No signs of volume overload at this time. 3. PAF: Stop amiodarone.  No symptoms of atrial fibrillation recently. 4. H/o hemorrhagic stroke: We have avoided oral anticoagulation. 5. Hyperlipidemia: LDL was above target.  [place] Spaniel he is started in April.  Recheck labs in June. 6. HTN: Fairly well-controlled.  Continue to monitor at home.  Increase duration of walking.  Try to maintain intensity such that he is mildly winded.   Current medicines are reviewed at length with the patient today.  The patient concerns regarding his medicines were addressed.  The following changes have been made: Stop amiodarone  Labs/ tests ordered today include:  No orders of the defined types were placed in this encounter.   Recommend 150 minutes/week of aerobic exercise Low fat, low carb, high fiber diet recommended  Disposition:   FU in 6 months   Signed, Lance Muss, MD  05/29/2017 11:36 AM    [facility] [address] [place], [place], [place]  [address] Phone: [phone]; Fax: [phone]

## 2017-05-29 ENCOUNTER — Ambulatory Visit: Payer: Medicare Other | Admitting: Interventional Cardiology

## 2017-05-29 ENCOUNTER — Encounter: Payer: Self-pay | Admitting: Interventional Cardiology

## 2017-05-29 VITALS — BP 136/90 | HR 72 | Ht 62.0 in | Wt 163.0 lb

## 2017-05-29 DIAGNOSIS — Z951 Presence of aortocoronary bypass graft: Secondary | ICD-10-CM

## 2017-05-29 DIAGNOSIS — I25118 Atherosclerotic heart disease of native coronary artery with other forms of angina pectoris: Secondary | ICD-10-CM | POA: Diagnosis not present

## 2017-05-29 DIAGNOSIS — I10 Essential (primary) hypertension: Secondary | ICD-10-CM | POA: Diagnosis not present

## 2017-05-29 DIAGNOSIS — E782 Mixed hyperlipidemia: Secondary | ICD-10-CM

## 2017-05-29 DIAGNOSIS — I480 Paroxysmal atrial fibrillation: Secondary | ICD-10-CM

## 2017-05-29 DIAGNOSIS — I5022 Chronic systolic (congestive) heart failure: Secondary | ICD-10-CM | POA: Diagnosis not present

## 2017-05-29 NOTE — Patient Instructions (Signed)
Medication Instructions:  Your physician has recommended you make the following change in your medication:   STOP: amiodarone  Labwork: Keep FASTING lab appointment on 07/11/17  Testing/Procedures: None ordered  Follow-Up: Your physician wants you to follow-up in: 6 months with Dr. Eldridge Dace. You will receive a reminder letter in the mail two months in advance. If you don't receive a letter, please call our office to schedule the follow-up appointment.   Any Other Special Instructions Will Be Listed Below (If Applicable).     If you need a refill on your cardiac medications before your next appointment, please call your pharmacy.

## 2017-06-25 ENCOUNTER — Other Ambulatory Visit: Payer: Self-pay | Admitting: Family Medicine

## 2017-07-11 ENCOUNTER — Telehealth: Payer: Self-pay

## 2017-07-11 ENCOUNTER — Other Ambulatory Visit: Payer: Medicare Other | Admitting: *Deleted

## 2017-07-11 DIAGNOSIS — Z79899 Other long term (current) drug therapy: Secondary | ICD-10-CM

## 2017-07-11 LAB — HEPATIC FUNCTION PANEL
ALT: 17 IU/L (ref 0–44)
AST: 27 IU/L (ref 0–40)
Albumin: 4.3 g/dL (ref 3.6–4.8)
Alkaline Phosphatase: 77 IU/L (ref 39–117)
BILIRUBIN TOTAL: 0.2 mg/dL (ref 0.0–1.2)
BILIRUBIN, DIRECT: 0.1 mg/dL (ref 0.00–0.40)
Total Protein: 6.6 g/dL (ref 6.0–8.5)

## 2017-07-11 LAB — LIPID PANEL
Chol/HDL Ratio: 1.8 ratio (ref 0.0–5.0)
Cholesterol, Total: 177 mg/dL (ref 100–199)
HDL: 96 mg/dL (ref >39)
LDL Calculated: 65 mg/dL (ref 0–99)
Triglycerides: 78 mg/dL (ref 0–149)
VLDL CHOLESTEROL CAL: 16 mg/dL (ref 5–40)

## 2017-07-11 NOTE — Telephone Encounter (Signed)
Written message received from patient's lab visit earlier today stating patient was having off/on achy chest pain.  Denied nausea, hurts when moving, family reports patient sleeping a lot lately.  Call to patient who denied active chest pain, states he has this pain on/off since he got his balloon. Pt saw Dr Eldridge DaceVaranasi May 2019.  Patient states mowing makes his "heart hurt more."  Patient put his sister "[name] Cruz" on the phone.  Ms Bethena Royseeden reported patient having pain for a couple of months.  She stated it hasn't changed and "she is a Product managercertified nurse so she knows when to call 911."  Ms Bethena Royseeden denied pt having nausea, diaphoresis, shortness of breath or chest pressure.  She stated she was the one who called 911 "the last time." Pt states he doesn't need to come to the ER at this time. Instructed Ms Bethena Royseeden to contact patient's PCP for evaluation of patient's symptoms.  Provided Ms Bethena Royseeden with patient's PCP phone number.  Ms Bethena Royseeden verbalized understanding and confirmed she would contact patient's mother to schedule an appt with PCP.  If patient's symptoms change or worsen she will call 911.

## 2017-07-12 ENCOUNTER — Emergency Department (HOSPITAL_COMMUNITY)
Admission: EM | Admit: 2017-07-12 | Discharge: 2017-07-12 | Disposition: A | Discharge status: Home / Self Care | Payer: Medicare Other | Attending: Emergency Medicine | Admitting: Emergency Medicine

## 2017-07-12 ENCOUNTER — Encounter (HOSPITAL_COMMUNITY): Payer: Self-pay | Admitting: Emergency Medicine

## 2017-07-12 ENCOUNTER — Emergency Department (HOSPITAL_COMMUNITY): Payer: Medicare Other

## 2017-07-12 DIAGNOSIS — I209 Angina pectoris, unspecified: Secondary | ICD-10-CM | POA: Diagnosis not present

## 2017-07-12 DIAGNOSIS — Z7982 Long term (current) use of aspirin: Secondary | ICD-10-CM | POA: Diagnosis not present

## 2017-07-12 DIAGNOSIS — R079 Chest pain, unspecified: Secondary | ICD-10-CM | POA: Diagnosis present

## 2017-07-12 DIAGNOSIS — I10 Essential (primary) hypertension: Secondary | ICD-10-CM | POA: Diagnosis not present

## 2017-07-12 DIAGNOSIS — Z79899 Other long term (current) drug therapy: Secondary | ICD-10-CM | POA: Insufficient documentation

## 2017-07-12 DIAGNOSIS — I208 Other forms of angina pectoris: Secondary | ICD-10-CM | POA: Diagnosis not present

## 2017-07-12 DIAGNOSIS — R0789 Other chest pain: Secondary | ICD-10-CM | POA: Diagnosis not present

## 2017-07-12 LAB — BASIC METABOLIC PANEL
ANION GAP: 10 (ref 5–15)
BUN: 7 mg/dL — ABNORMAL LOW (ref 8–23)
CO2: 27 mmol/L (ref 22–32)
Calcium: 9 mg/dL (ref 8.9–10.3)
Chloride: 102 mmol/L (ref 98–111)
Creatinine, Ser: 0.78 mg/dL (ref 0.61–1.24)
Glucose, Bld: 172 mg/dL — ABNORMAL HIGH (ref 70–99)
POTASSIUM: 4.2 mmol/L (ref 3.5–5.1)
SODIUM: 139 mmol/L (ref 135–145)

## 2017-07-12 LAB — I-STAT TROPONIN, ED
TROPONIN I, POC: 0.01 ng/mL (ref 0.00–0.08)
Troponin i, poc: 0.01 ng/mL (ref 0.00–0.08)

## 2017-07-12 LAB — CBC
HEMATOCRIT: 40.2 % (ref 39.0–52.0)
Hemoglobin: 12.9 g/dL — ABNORMAL LOW (ref 13.0–17.0)
MCH: 29.7 pg (ref 26.0–34.0)
MCHC: 32.1 g/dL (ref 30.0–36.0)
MCV: 92.6 fL (ref 78.0–100.0)
Platelets: 240 10*3/uL (ref 150–400)
RBC: 4.34 MIL/uL (ref 4.22–5.81)
RDW: 16.7 % — AB (ref 11.5–15.5)
WBC: 5.4 10*3/uL (ref 4.0–10.5)

## 2017-07-12 NOTE — Discharge Instructions (Addendum)
Call make appointment to follow-up with your cardiologist next week.  Return immediately for any worsening of your symptoms or concerns.

## 2017-07-12 NOTE — Progress Notes (Signed)
Patient disucssed with ER staff. History of CAD with prior CABG. Several month history of fairly stable exertional chest pain that is unchanged. No rest pain, symptoms have not been progressing.  He presented to ER after talking to one of his providers nurses. In ER EKG without acute specific ischemic changes, trop neg x 1. Plan for delta troponin, if negative then close outpatient f/u.   Would increase his coreg to 6.25mg  bid for additional antianginal benefits.  We will arrange close outpatient f/u.    Dina RichJonathan Brooklyn Alfredo MD

## 2017-07-12 NOTE — ED Provider Notes (Signed)
[facility] EMERGENCY DEPARTMENT Provider Note   CSN: [id number] Arrival date & time: 07/12/17  1246     History   Chief Complaint Chief Complaint  Patient presents with  . Chest Pain    HPI [name] Cruz is a 62 y.o. male.  HPI Pt states he has history of a heart attack.   Pt was admitted to the hospital in Feb 2019 with NSTEMI and after Cardiac cath underwent CABG x3.  Since then  He has been very active, mowing the lawn, doing yard work.  He has been having chest pain for several weeks now.  The sx go off and on.  It seems to occur when he moves around, mows the lawn.  It lasts for 30 minutes or so.  He called his cardiologist office yesterday and was instructed to come to the ED to be checked out. Past Medical History:  Diagnosis Date  . Cognitive impairment 2007   after stroke, saw rehab but told to stop because was too upsetting to him  . History of chicken pox   . HLD (hyperlipidemia)   . HTN (hypertension)   . NSTEMI (non-ST elevated myocardial infarction) (HCC) 02/21/2017  . Obesity   . Stroke, hemorrhagic (HCC) 2007   thought 2/2 HTN (240sbp); residual cognitive impairment, loss of R peripheral field, no driving    Patient Active Problem List   Diagnosis Date Noted  . CAD (coronary artery disease), native coronary artery   . S/P CABG x 3 02/24/2017  . NSTEMI (non-ST elevated myocardial infarction) (HCC) 02/21/2017  . Atrial fibrillation with RVR (HCC)   . Medicare annual wellness visit, subsequent 10/17/2014  . Health maintenance examination 10/17/2014  . Advanced care planning/counseling discussion 10/17/2014  . Encephalomalacia 10/28/2012  . Seizures (HCC) 04/26/2012  . Habitual alcohol use 04/26/2012  . History of stroke with current residual effects   . Cognitive impairment   . HLD (hyperlipidemia)   . HTN (hypertension)   . Obesity, Class I, BMI 30-34.9     Past Surgical History:  Procedure Laterality Date  . ANKLE SURGERY  1990s     right foot with plate and screws  . CORONARY ARTERY BYPASS GRAFT N/A 02/24/2017   3v Procedure: CORONARY ARTERY BYPASS GRAFTING (CABG) x 3 ON PUMP USING LEFT INTERNAL MAMMARY ARTERY TO LEFT ANTERIOR DESENDING CORNARY ARTERY, RIGHT GREATER SAPHENOUS VEIN TO LEFT CIRCUMFLEX ARTERY AND POSTERIOR DESENDING ARTERY. RIGHT GREATER SAPHENOUS VEIN OBTAINED VIA ENDOVEIN HARVEST.;  Surgeon: Delight Ovens, MD  . IABP INSERTION N/A 02/21/2017   Procedure: IABP Insertion;  Surgeon: Yvonne Kendall, MD;  [facility] INV[facility] LAB;  Service: Cardiovascular;  Laterality: N/A;  . LEFT HEART CATH AND CORONARY ANGIOGRAPHY N/A 02/21/2017   Procedure: LEFT HEART CATH AND CORONARY ANGIOGRAPHY;  Surgeon: Yvonne Kendall, MD;  Location: [facility]VAS[facility] CV LAB;  Service: Cardiovascular;  Laterality: N/A;  . TEE WITHOUT CARDIOVERSION N/A 02/24/2017   Procedure: TRANSESOPHAGEAL ECHOCARDIOGRAM (TEE);  Surgeon: Delight Ovens, MD;  Location: [facility] OR;  Service: Open Heart Surgery;  Laterality: N/A;        Home Medications    Prior to Admission medications   Medication Sig Start Date End Date Taking? Authorizing Provider  aspirin EC 325 MG EC tablet Take 1 tablet (325 mg total) by mouth daily. 03/02/17  Yes Gold, Wayne E, PA-C  atorvastatin (LIPITOR) 80 MG tablet TAKE 1 TABLET (80 MG TOTAL) BY MOUTH DAILY. FOR CHOLESTEROL 03/20/17  Yes Allayne Butcher, PA-C  carvedilol (COREG) 3.125 MG tablet Take 1 tablet (3.125 mg total) by mouth 2 (two) times daily with a meal. 03/20/17  Yes Robbie Lis M, PA-C  ezetimibe (ZETIA) 10 MG tablet Take 1 tablet (10 mg total) by mouth daily. 05/16/17 08/14/17 Yes Simmons, Brittainy M, PA-C  furosemide (LASIX) 40 MG tablet Take 40 mg by mouth.   Yes [provider]  losartan (COZAAR) 25 MG tablet Take 1 tablet (25 mg total) by mouth daily. 03/20/17  Yes Robbie Lis M, PA-C  oxyCODONE (OXY IR/ROXICODONE) 5 MG immediate release tablet Take 1-2 tablets (5-10 mg total) by  mouth every 6 (six) hours as needed for severe pain. Patient not taking: Reported on 07/12/2017 03/02/17   Rowe Clack, PA-C    Family History Family History  Problem Relation Age of Onset  . Alzheimer's disease Maternal Grandfather   . Cancer Mother        lymphoma  . Alcohol abuse Father        smoker  . Coronary artery disease Neg Hx   . Stroke Neg Hx   . Diabetes Neg Hx     Social History Social History   Tobacco Use  . Smoking status: Never Smoker  . Smokeless tobacco: Former Engineer, water Use Topics  . Alcohol use: Yes    Alcohol/week: 8.4 oz    Types: 12 Cans of beer, 2 Shots of liquor per week    Comment: regular alcohol use  . Drug use: No     Allergies   Patient has no known allergies.   Review of Systems Review of Systems  All other systems reviewed and are negative.    Physical Exam Updated Vital Signs BP 119/67   Pulse 76   Temp 98.6 F (37 C) (Oral)   Resp 13   SpO2 97%   Physical Exam  Constitutional: He appears well-developed and well-nourished. No distress.  HENT:  Head: Normocephalic and atraumatic.  Right Ear: External ear normal.  Left Ear: External ear normal.  Eyes: Conjunctivae are normal. Right eye exhibits no discharge. Left eye exhibits no discharge. No scleral icterus.  Neck: Neck supple. No tracheal deviation present.  Cardiovascular: Normal rate, regular rhythm and intact distal pulses.  Pulmonary/Chest: Effort normal and breath sounds normal. No stridor. No respiratory distress. He has no wheezes. He has no rales.  Abdominal: Soft. Bowel sounds are normal. He exhibits no distension. There is no tenderness. There is no rebound and no guarding.  Musculoskeletal: He exhibits no edema or tenderness.  Neurological: He is alert. He has normal strength. No cranial nerve deficit (no facial droop, extraocular movements intact, no slurred speech) or sensory deficit. He exhibits normal muscle tone. He displays no seizure activity.  Coordination normal.  Skin: Skin is warm and dry. No rash noted.  Psychiatric: He has a normal mood and affect.  Nursing note and vitals reviewed.    ED Treatments / Results  Labs (all labs ordered are listed, but only abnormal results are displayed) Labs Reviewed  BASIC METABOLIC PANEL - Abnormal; Notable for the following components:      Result Value   Glucose, Bld 172 (*)    BUN 7 (*)    All other components within normal limits  CBC - Abnormal; Notable for the following components:   Hemoglobin 12.9 (*)    RDW 16.7 (*)    All other components within normal limits  I-STAT TROPONIN, ED  I-STAT TROPONIN, ED    EKG EKG  Interpretation  Date/Time:  Saturday July 12 2017 12:49:04 EDT Ventricular Rate:  94 PR Interval:  134 QRS Duration: 76 QT Interval:  364 QTC Calculation: 455 R Axis:   -6 Text Interpretation:  Normal sinus rhythm Anteroseptal infarct , age undetermined Abnormal ECG slight changes  anteriorly compared to prior tracing Confirmed by Linwood DibblesKnapp, Idy Rawling [phone]([id number]) on 07/12/2017 3:09:51 PM   Radiology Dg Chest 2 View  Result Date: 07/12/2017 CLINICAL DATA:  62 year old male with intermittent left-sided chest pain for the past week EXAM: CHEST - 2 VIEW COMPARISON:  Prior chest x-ray 03/31/2017 FINDINGS: Patient is status post median sternotomy with evidence of prior multivessel CABG. Cardiac and mediastinal contours remain unchanged in within normal limits. No evidence of pulmonary edema, pleural effusion, pneumothorax or pneumonia. No suspicious pulmonary mass or nodule. Osseous structures are intact. Atherosclerotic vascular calcifications present in both carotid arteries and in the abdominal aorta. IMPRESSION: 1. No acute cardiopulmonary process. 2.  Aortic Atherosclerosis (ICD10-170.0) 3. Bilateral carotid artery calcifications. Electronically Signed   By: Malachy MoanHeath  McCullough M.D.   On: 07/12/2017 13:24    Procedures Procedures (including critical care time)  Medications  Ordered in ED Medications - No data to display   Initial Impression / Assessment and Plan / ED Course  I have reviewed the triage vital signs and the nursing notes.  Pertinent labs & imaging results that were available during my care of the patient were reviewed by me and considered in my medical decision making (see chart for details).  Clinical Course as of Jul 12 1552  Sat Jul 12, 2017  1553 Discussed with Dr Wyline MoodBranch.  Recommend delta troponin.  If negative, pt can follow up in the cardiology clinic for close follow up.   [JK]    Clinical Course User Index [JK] Linwood DibblesKnapp, Jerrell Hart, MD    Patient presented to the emergency room for evaluation of chest pain.  Symptoms have been ongoing for over a month.  Patient does have a known history of cardiac disease he.  He recently had a bypass in February.  Patient has noticed some chest discomfort with activity over the last month.  He is not having any chest pain at rest.  Initial troponin is normal.  I discussed the case with Dr. Wyline MoodBranch, cardiology.  The patient's symptoms have remained rather stable.  We will get a delta troponin in the emergency room.  If this continues to be normal the patient can follow-up closely with his cardiologist as an outpatient.  Warning signs and precautions discussed.  Delta trop turned over to Dr Ranae PalmsYelverton Final Clinical Impressions(s) / ED Diagnoses   Final diagnoses:  Exercise-induced angina [facility](HCC)    ED Discharge Orders    None       Linwood DibblesKnapp, Jahzir Strohmeier, MD 07/12/17 1557

## 2017-07-12 NOTE — ED Triage Notes (Signed)
Patient to ED c/o intermittent L-sided CP for just over a week. History open heart surgery in February. Patient reports SOB with pain as well. Denies dizziness or N/V. Resp e/u, skin warm/dry.

## 2017-07-15 ENCOUNTER — Telehealth: Payer: Self-pay | Admitting: *Deleted

## 2017-07-15 NOTE — Telephone Encounter (Signed)
Pt sister, Dondra SpryGail, [place], returned my all and she has been made aware of pts lab results and she verbalized understanding.

## 2017-08-09 ENCOUNTER — Encounter (HOSPITAL_COMMUNITY): Payer: Self-pay

## 2017-08-09 ENCOUNTER — Emergency Department (HOSPITAL_COMMUNITY): Payer: Medicare Other

## 2017-08-09 ENCOUNTER — Other Ambulatory Visit: Payer: Self-pay

## 2017-08-09 ENCOUNTER — Inpatient Hospital Stay (HOSPITAL_COMMUNITY): Payer: Medicare Other

## 2017-08-09 ENCOUNTER — Inpatient Hospital Stay (HOSPITAL_COMMUNITY)
Admission: EM | Admit: 2017-08-09 | Discharge: 2017-08-22 | DRG: 100 | Disposition: A | Discharge status: Home Health | Payer: Medicare Other | Attending: Internal Medicine | Admitting: Internal Medicine

## 2017-08-09 DIAGNOSIS — J189 Pneumonia, unspecified organism: Secondary | ICD-10-CM | POA: Diagnosis not present

## 2017-08-09 DIAGNOSIS — J9601 Acute respiratory failure with hypoxia: Secondary | ICD-10-CM | POA: Diagnosis not present

## 2017-08-09 DIAGNOSIS — R402142 Coma scale, eyes open, spontaneous, at arrival to emergency department: Secondary | ICD-10-CM | POA: Diagnosis not present

## 2017-08-09 DIAGNOSIS — N390 Urinary tract infection, site not specified: Secondary | ICD-10-CM | POA: Diagnosis not present

## 2017-08-09 DIAGNOSIS — I959 Hypotension, unspecified: Secondary | ICD-10-CM | POA: Diagnosis present

## 2017-08-09 DIAGNOSIS — R29818 Other symptoms and signs involving the nervous system: Secondary | ICD-10-CM | POA: Diagnosis not present

## 2017-08-09 DIAGNOSIS — I2510 Atherosclerotic heart disease of native coronary artery without angina pectoris: Secondary | ICD-10-CM | POA: Diagnosis present

## 2017-08-09 DIAGNOSIS — I351 Nonrheumatic aortic (valve) insufficiency: Secondary | ICD-10-CM | POA: Diagnosis not present

## 2017-08-09 DIAGNOSIS — R4020 Unspecified coma: Secondary | ICD-10-CM | POA: Diagnosis not present

## 2017-08-09 DIAGNOSIS — R509 Fever, unspecified: Secondary | ICD-10-CM

## 2017-08-09 DIAGNOSIS — Z4659 Encounter for fitting and adjustment of other gastrointestinal appliance and device: Secondary | ICD-10-CM

## 2017-08-09 DIAGNOSIS — I25810 Atherosclerosis of coronary artery bypass graft(s) without angina pectoris: Secondary | ICD-10-CM

## 2017-08-09 DIAGNOSIS — I472 Ventricular tachycardia: Secondary | ICD-10-CM | POA: Diagnosis not present

## 2017-08-09 DIAGNOSIS — S0990XA Unspecified injury of head, initial encounter: Secondary | ICD-10-CM | POA: Diagnosis not present

## 2017-08-09 DIAGNOSIS — Z9911 Dependence on respirator [ventilator] status: Secondary | ICD-10-CM | POA: Diagnosis not present

## 2017-08-09 DIAGNOSIS — E785 Hyperlipidemia, unspecified: Secondary | ICD-10-CM | POA: Diagnosis present

## 2017-08-09 DIAGNOSIS — E162 Hypoglycemia, unspecified: Secondary | ICD-10-CM | POA: Diagnosis not present

## 2017-08-09 DIAGNOSIS — I69391 Dysphagia following cerebral infarction: Secondary | ICD-10-CM | POA: Diagnosis not present

## 2017-08-09 DIAGNOSIS — F1010 Alcohol abuse, uncomplicated: Secondary | ICD-10-CM | POA: Diagnosis not present

## 2017-08-09 DIAGNOSIS — I82622 Acute embolism and thrombosis of deep veins of left upper extremity: Secondary | ICD-10-CM

## 2017-08-09 DIAGNOSIS — R569 Unspecified convulsions: Secondary | ICD-10-CM

## 2017-08-09 DIAGNOSIS — I469 Cardiac arrest, cause unspecified: Secondary | ICD-10-CM | POA: Diagnosis not present

## 2017-08-09 DIAGNOSIS — R1312 Dysphagia, oropharyngeal phase: Secondary | ICD-10-CM | POA: Diagnosis not present

## 2017-08-09 DIAGNOSIS — I69328 Other speech and language deficits following cerebral infarction: Secondary | ICD-10-CM

## 2017-08-09 DIAGNOSIS — K5900 Constipation, unspecified: Secondary | ICD-10-CM | POA: Diagnosis present

## 2017-08-09 DIAGNOSIS — M7989 Other specified soft tissue disorders: Secondary | ICD-10-CM | POA: Diagnosis not present

## 2017-08-09 DIAGNOSIS — D649 Anemia, unspecified: Secondary | ICD-10-CM | POA: Diagnosis not present

## 2017-08-09 DIAGNOSIS — G40901 Epilepsy, unspecified, not intractable, with status epilepticus: Secondary | ICD-10-CM

## 2017-08-09 DIAGNOSIS — I468 Cardiac arrest due to other underlying condition: Secondary | ICD-10-CM | POA: Diagnosis present

## 2017-08-09 DIAGNOSIS — Z9289 Personal history of other medical treatment: Secondary | ICD-10-CM | POA: Diagnosis not present

## 2017-08-09 DIAGNOSIS — Z87891 Personal history of nicotine dependence: Secondary | ICD-10-CM

## 2017-08-09 DIAGNOSIS — R402252 Coma scale, best verbal response, oriented, at arrival to emergency department: Secondary | ICD-10-CM | POA: Diagnosis present

## 2017-08-09 DIAGNOSIS — I252 Old myocardial infarction: Secondary | ICD-10-CM

## 2017-08-09 DIAGNOSIS — G40401 Other generalized epilepsy and epileptic syndromes, not intractable, with status epilepticus: Principal | ICD-10-CM | POA: Diagnosis present

## 2017-08-09 DIAGNOSIS — R4182 Altered mental status, unspecified: Secondary | ICD-10-CM | POA: Diagnosis not present

## 2017-08-09 DIAGNOSIS — I4891 Unspecified atrial fibrillation: Secondary | ICD-10-CM | POA: Diagnosis not present

## 2017-08-09 DIAGNOSIS — R739 Hyperglycemia, unspecified: Secondary | ICD-10-CM | POA: Diagnosis present

## 2017-08-09 DIAGNOSIS — E871 Hypo-osmolality and hyponatremia: Secondary | ICD-10-CM | POA: Diagnosis present

## 2017-08-09 DIAGNOSIS — I6930 Unspecified sequelae of cerebral infarction: Secondary | ICD-10-CM | POA: Diagnosis not present

## 2017-08-09 DIAGNOSIS — I110 Hypertensive heart disease with heart failure: Secondary | ICD-10-CM | POA: Diagnosis present

## 2017-08-09 DIAGNOSIS — I639 Cerebral infarction, unspecified: Secondary | ICD-10-CM

## 2017-08-09 DIAGNOSIS — I5023 Acute on chronic systolic (congestive) heart failure: Secondary | ICD-10-CM | POA: Diagnosis not present

## 2017-08-09 DIAGNOSIS — I952 Hypotension due to drugs: Secondary | ICD-10-CM | POA: Diagnosis not present

## 2017-08-09 DIAGNOSIS — J9690 Respiratory failure, unspecified, unspecified whether with hypoxia or hypercapnia: Secondary | ICD-10-CM | POA: Diagnosis not present

## 2017-08-09 DIAGNOSIS — R402352 Coma scale, best motor response, localizes pain, at arrival to emergency department: Secondary | ICD-10-CM | POA: Diagnosis present

## 2017-08-09 DIAGNOSIS — R001 Bradycardia, unspecified: Secondary | ICD-10-CM | POA: Diagnosis not present

## 2017-08-09 DIAGNOSIS — R0682 Tachypnea, not elsewhere classified: Secondary | ICD-10-CM

## 2017-08-09 DIAGNOSIS — I69319 Unspecified symptoms and signs involving cognitive functions following cerebral infarction: Secondary | ICD-10-CM

## 2017-08-09 DIAGNOSIS — G40909 Epilepsy, unspecified, not intractable, without status epilepticus: Secondary | ICD-10-CM

## 2017-08-09 DIAGNOSIS — Z951 Presence of aortocoronary bypass graft: Secondary | ICD-10-CM | POA: Diagnosis not present

## 2017-08-09 DIAGNOSIS — R339 Retention of urine, unspecified: Secondary | ICD-10-CM | POA: Diagnosis not present

## 2017-08-09 DIAGNOSIS — R0902 Hypoxemia: Secondary | ICD-10-CM | POA: Diagnosis not present

## 2017-08-09 DIAGNOSIS — E876 Hypokalemia: Secondary | ICD-10-CM | POA: Diagnosis not present

## 2017-08-09 DIAGNOSIS — Z4682 Encounter for fitting and adjustment of non-vascular catheter: Secondary | ICD-10-CM | POA: Diagnosis not present

## 2017-08-09 DIAGNOSIS — R609 Edema, unspecified: Secondary | ICD-10-CM | POA: Diagnosis not present

## 2017-08-09 LAB — CBC
HCT: 34.9 % — ABNORMAL LOW (ref 39.0–52.0)
Hemoglobin: 11.2 g/dL — ABNORMAL LOW (ref 13.0–17.0)
MCH: 31 pg (ref 26.0–34.0)
MCHC: 32.1 g/dL (ref 30.0–36.0)
MCV: 96.7 fL (ref 78.0–100.0)
PLATELETS: 231 10*3/uL (ref 150–400)
RBC: 3.61 MIL/uL — ABNORMAL LOW (ref 4.22–5.81)
RDW: 15.5 % (ref 11.5–15.5)
WBC: 7.9 10*3/uL (ref 4.0–10.5)

## 2017-08-09 LAB — URINALYSIS, ROUTINE W REFLEX MICROSCOPIC
BILIRUBIN URINE: NEGATIVE
Glucose, UA: NEGATIVE mg/dL
Hgb urine dipstick: NEGATIVE
KETONES UR: 5 mg/dL — AB
Leukocytes, UA: NEGATIVE
Nitrite: NEGATIVE
PH: 5 (ref 5.0–8.0)
PROTEIN: 100 mg/dL — AB
Specific Gravity, Urine: 1.014 (ref 1.005–1.030)

## 2017-08-09 LAB — BASIC METABOLIC PANEL
Anion gap: 11 (ref 5–15)
Anion gap: 10 (ref 5–15)
BUN: 10 mg/dL (ref 8–23)
BUN: 10 mg/dL (ref 8–23)
CALCIUM: 8.5 mg/dL — AB (ref 8.9–10.3)
CALCIUM: 8 mg/dL — AB (ref 8.9–10.3)
CO2: 22 mmol/L (ref 22–32)
CO2: 22 mmol/L (ref 22–32)
CREATININE: 0.85 mg/dL (ref 0.61–1.24)
CREATININE: 0.9 mg/dL (ref 0.61–1.24)
Chloride: 99 mmol/L (ref 98–111)
Chloride: 101 mmol/L (ref 98–111)
GFR calc Af Amer: >60 mL/min (ref >60)
GFR calc non Af Amer: >60 mL/min (ref >60)
GLUCOSE: 206 mg/dL — AB (ref 70–99)
Glucose, Bld: 235 mg/dL — ABNORMAL HIGH (ref 70–99)
Potassium: 3.9 mmol/L (ref 3.5–5.1)
Potassium: 4.3 mmol/L (ref 3.5–5.1)
SODIUM: 133 mmol/L — AB (ref 135–145)
Sodium: 132 mmol/L — ABNORMAL LOW (ref 135–145)

## 2017-08-09 LAB — MRSA PCR SCREENING: MRSA by PCR: NEGATIVE

## 2017-08-09 LAB — POCT I-STAT 3, ART BLOOD GAS (G3+)
ACID-BASE DEFICIT: 6 mmol/L — AB (ref 0.0–2.0)
BICARBONATE: 21.9 mmol/L (ref 20.0–28.0)
O2 SAT: 93 %
TCO2: 23 mmol/L (ref 22–32)
pCO2 arterial: 52.7 mmHg — ABNORMAL HIGH (ref 32.0–48.0)
pH, Arterial: 7.225 — ABNORMAL LOW (ref 7.350–7.450)
pO2, Arterial: 79 mmHg — ABNORMAL LOW (ref 83.0–108.0)

## 2017-08-09 LAB — GLUCOSE, CAPILLARY
Glucose-Capillary: 120 mg/dL — ABNORMAL HIGH (ref 70–99)
Glucose-Capillary: 166 mg/dL — ABNORMAL HIGH (ref 70–99)
Glucose-Capillary: 228 mg/dL — ABNORMAL HIGH (ref 70–99)

## 2017-08-09 LAB — MAGNESIUM: MAGNESIUM: 1.8 mg/dL (ref 1.7–2.4)

## 2017-08-09 LAB — PROTIME-INR
INR: 1.04
PROTHROMBIN TIME: 13.5 s (ref 11.4–15.2)

## 2017-08-09 LAB — APTT: aPTT: 28 seconds (ref 24–36)

## 2017-08-09 LAB — TRIGLYCERIDES: Triglycerides: 50 mg/dL (ref <150)

## 2017-08-09 LAB — CBG MONITORING, ED: Glucose-Capillary: 200 mg/dL — ABNORMAL HIGH (ref 70–99)

## 2017-08-09 LAB — TROPONIN I
TROPONIN I: 0.14 ng/mL — AB (ref <0.03)
Troponin I: 0.28 ng/mL (ref <0.03)

## 2017-08-09 LAB — PHENYTOIN LEVEL, TOTAL

## 2017-08-09 MED ORDER — FENTANYL BOLUS VIA INFUSION
50.0000 ug | INTRAVENOUS | Status: DC | PRN
  Filled 2017-08-09: qty 50

## 2017-08-09 MED ORDER — SODIUM CHLORIDE 0.9 % IV SOLN
2.0000 mg/h | INTRAVENOUS | Status: DC
  Filled 2017-08-09: qty 10

## 2017-08-09 MED ORDER — MIDAZOLAM HCL 2 MG/2ML IJ SOLN
2.0000 mg | INTRAMUSCULAR | Status: DC | PRN
  Administered 2017-08-09 (×2): 2 mg via INTRAVENOUS
  Filled 2017-08-09: qty 2

## 2017-08-09 MED ORDER — HYDRALAZINE HCL 20 MG/ML IJ SOLN
10.0000 mg | INTRAMUSCULAR | Status: DC | PRN
  Administered 2017-08-16 – 2017-08-17 (×2): 10 mg via INTRAVENOUS
  Filled 2017-08-09 (×2): qty 1

## 2017-08-09 MED ORDER — MIDAZOLAM HCL 2 MG/2ML IJ SOLN: 2.0000 mg | Freq: Once | INTRAMUSCULAR | Status: DC

## 2017-08-09 MED ORDER — AMIODARONE HCL 200 MG PO TABS
200.0000 mg | ORAL_TABLET | Freq: Every day | ORAL | Status: DC
  Administered 2017-08-10 – 2017-08-11 (×2): 200 mg
  Filled 2017-08-09 (×2): qty 1

## 2017-08-09 MED ORDER — INSULIN ASPART 100 UNIT/ML ~~LOC~~ SOLN
0.0000 [IU] | SUBCUTANEOUS | Status: DC
  Administered 2017-08-09: 2 [IU] via SUBCUTANEOUS
  Administered 2017-08-09: 3 [IU] via SUBCUTANEOUS
  Administered 2017-08-10: 2 [IU] via SUBCUTANEOUS
  Administered 2017-08-10: 3 [IU] via SUBCUTANEOUS
  Administered 2017-08-10 – 2017-08-13 (×6): 1 [IU] via SUBCUTANEOUS
  Administered 2017-08-13: 2 [IU] via SUBCUTANEOUS
  Administered 2017-08-14 (×4): 1 [IU] via SUBCUTANEOUS
  Administered 2017-08-15: 2 [IU] via SUBCUTANEOUS
  Administered 2017-08-15 – 2017-08-16 (×3): 1 [IU] via SUBCUTANEOUS

## 2017-08-09 MED ORDER — MIDAZOLAM HCL 2 MG/2ML IJ SOLN
2.0000 mg | INTRAMUSCULAR | Status: DC | PRN
  Filled 2017-08-09: qty 2

## 2017-08-09 MED ORDER — NOREPINEPHRINE 4 MG/250ML-% IV SOLN
0.0000 ug/min | INTRAVENOUS | Status: DC
  Administered 2017-08-09: 5 ug/min via INTRAVENOUS
  Administered 2017-08-10: 14 ug/min via INTRAVENOUS
  Filled 2017-08-09: qty 250

## 2017-08-09 MED ORDER — SODIUM CHLORIDE 0.9 % IV SOLN
200.0000 mg | INTRAVENOUS | Status: AC
  Administered 2017-08-09: 200 mg via INTRAVENOUS
  Filled 2017-08-09: qty 20

## 2017-08-09 MED ORDER — DEXTROSE 5 % IV SOLN
INTRAVENOUS | Status: AC | PRN
  Administered 2017-08-09: 300 mg via INTRAVENOUS

## 2017-08-09 MED ORDER — LORAZEPAM 2 MG/ML IJ SOLN
1.0000 mg | Freq: Once | INTRAMUSCULAR | Status: AC
  Administered 2017-08-09: 1 mg via INTRAVENOUS

## 2017-08-09 MED ORDER — CHLORHEXIDINE GLUCONATE 0.12% ORAL RINSE (MEDLINE KIT)
15.0000 mL | Freq: Two times a day (BID) | OROMUCOSAL | Status: DC
  Administered 2017-08-09 – 2017-08-16 (×14): 15 mL via OROMUCOSAL

## 2017-08-09 MED ORDER — SODIUM CHLORIDE 0.9 % IV BOLUS
1000.0000 mL | Freq: Once | INTRAVENOUS | Status: AC
  Administered 2017-08-09: 1000 mL via INTRAVENOUS

## 2017-08-09 MED ORDER — LORAZEPAM 2 MG/ML IJ SOLN
INTRAMUSCULAR | Status: AC
  Filled 2017-08-09: qty 1

## 2017-08-09 MED ORDER — LORAZEPAM 2 MG/ML IJ SOLN
4.0000 mg | INTRAMUSCULAR | Status: AC
  Administered 2017-08-09: 4 mg via INTRAVENOUS

## 2017-08-09 MED ORDER — LEVETIRACETAM IN NACL 1000 MG/100ML IV SOLN
1000.0000 mg | Freq: Once | INTRAVENOUS | Status: AC
  Administered 2017-08-09: 1000 mg via INTRAVENOUS
  Filled 2017-08-09: qty 100

## 2017-08-09 MED ORDER — PROPOFOL 1000 MG/100ML IV EMUL
5.0000 ug/kg/min | Freq: Once | INTRAVENOUS | Status: AC
  Administered 2017-08-09: 5 ug/kg/min via INTRAVENOUS

## 2017-08-09 MED ORDER — PROPOFOL 1000 MG/100ML IV EMUL
INTRAVENOUS | Status: AC
  Filled 2017-08-09: qty 100

## 2017-08-09 MED ORDER — CHLORHEXIDINE GLUCONATE CLOTH 2 % EX PADS
6.0000 | MEDICATED_PAD | Freq: Every day | CUTANEOUS | Status: DC
  Administered 2017-08-09: 6 via TOPICAL

## 2017-08-09 MED ORDER — ARTIFICIAL TEARS OPHTHALMIC OINT
1.0000 "application " | TOPICAL_OINTMENT | Freq: Three times a day (TID) | OPHTHALMIC | Status: DC
  Filled 2017-08-09: qty 3.5

## 2017-08-09 MED ORDER — ASPIRIN 300 MG RE SUPP: 300.0000 mg | RECTAL | Status: AC

## 2017-08-09 MED ORDER — FENTANYL CITRATE (PF) 100 MCG/2ML IJ SOLN
100.0000 ug | INTRAMUSCULAR | Status: AC | PRN
  Administered 2017-08-09 (×3): 100 ug via INTRAVENOUS
  Filled 2017-08-09 (×2): qty 2

## 2017-08-09 MED ORDER — PROPOFOL 1000 MG/100ML IV EMUL
100.0000 ug/kg/min | INTRAVENOUS | Status: DC
  Administered 2017-08-09: 60 ug/kg/min via INTRAVENOUS
  Administered 2017-08-10 (×8): 100 ug/kg/min via INTRAVENOUS
  Administered 2017-08-10 (×2): 80 ug/kg/min via INTRAVENOUS
  Administered 2017-08-11 – 2017-08-12 (×15): 100 ug/kg/min via INTRAVENOUS
  Filled 2017-08-09: qty 200
  Filled 2017-08-09 (×9): qty 100
  Filled 2017-08-09: qty 300
  Filled 2017-08-09: qty 100
  Filled 2017-08-09: qty 400
  Filled 2017-08-09: qty 100
  Filled 2017-08-09: qty 200
  Filled 2017-08-09: qty 100
  Filled 2017-08-09: qty 300

## 2017-08-09 MED ORDER — PANTOPRAZOLE SODIUM 40 MG PO PACK
40.0000 mg | PACK | Freq: Every day | ORAL | Status: DC
  Administered 2017-08-10 – 2017-08-13 (×4): 40 mg
  Filled 2017-08-09 (×4): qty 20

## 2017-08-09 MED ORDER — CISATRACURIUM BOLUS VIA INFUSION
0.1000 mg/kg | Freq: Once | INTRAVENOUS | Status: DC
  Filled 2017-08-09: qty 7

## 2017-08-09 MED ORDER — SODIUM CHLORIDE 0.9 % IV SOLN
INTRAVENOUS | Status: DC
  Administered 2017-08-09 – 2017-08-11 (×3): via INTRAVENOUS

## 2017-08-09 MED ORDER — LORAZEPAM 2 MG/ML IJ SOLN
INTRAMUSCULAR | Status: AC
  Filled 2017-08-09: qty 2

## 2017-08-09 MED ORDER — CHLORHEXIDINE GLUCONATE 0.12% ORAL RINSE (MEDLINE KIT): 15.0000 mL | Freq: Two times a day (BID) | OROMUCOSAL | Status: DC

## 2017-08-09 MED ORDER — SODIUM CHLORIDE 0.9% FLUSH
10.0000 mL | Freq: Two times a day (BID) | INTRAVENOUS | Status: DC
  Administered 2017-08-09 – 2017-08-13 (×7): 10 mL
  Administered 2017-08-14: 20 mL
  Administered 2017-08-14: 10 mL

## 2017-08-09 MED ORDER — CISATRACURIUM BOLUS VIA INFUSION
0.0500 mg/kg | INTRAVENOUS | Status: DC | PRN
  Filled 2017-08-09: qty 4

## 2017-08-09 MED ORDER — EPINEPHRINE PF 1 MG/10ML IJ SOSY
PREFILLED_SYRINGE | INTRAMUSCULAR | Status: AC | PRN
  Administered 2017-08-09: 1 via INTRAVENOUS
  Administered 2017-08-09: 1 mg via INTRAVENOUS

## 2017-08-09 MED ORDER — SODIUM CHLORIDE 0.9 % IV SOLN
1000.0000 mg | Freq: Once | INTRAVENOUS | Status: AC
  Administered 2017-08-09: 1000 mg via INTRAVENOUS
  Filled 2017-08-09: qty 20

## 2017-08-09 MED ORDER — SODIUM CHLORIDE 0.9 % IV SOLN
75.0000 mL/h | INTRAVENOUS | Status: DC
  Administered 2017-08-09: 75 mL/h via INTRAVENOUS

## 2017-08-09 MED ORDER — MIDAZOLAM BOLUS VIA INFUSION
2.0000 mg | INTRAVENOUS | Status: DC | PRN
  Filled 2017-08-09: qty 2

## 2017-08-09 MED ORDER — FENTANYL 2500MCG IN NS 250ML (10MCG/ML) PREMIX INFUSION
100.0000 ug/h | INTRAVENOUS | Status: DC
  Filled 2017-08-09: qty 250

## 2017-08-09 MED ORDER — ORAL CARE MOUTH RINSE: 15.0000 mL | OROMUCOSAL | Status: DC

## 2017-08-09 MED ORDER — SODIUM CHLORIDE 0.9 % IV SOLN
200.0000 mg | Freq: Two times a day (BID) | INTRAVENOUS | Status: DC
  Administered 2017-08-10 – 2017-08-15 (×12): 200 mg via INTRAVENOUS
  Filled 2017-08-09 (×15): qty 20

## 2017-08-09 MED ORDER — SODIUM CHLORIDE 0.9 % IV SOLN
1.0000 ug/kg/min | INTRAVENOUS | Status: DC
  Filled 2017-08-09: qty 20

## 2017-08-09 MED ORDER — FENTANYL CITRATE (PF) 100 MCG/2ML IJ SOLN: 100.0000 ug | Freq: Once | INTRAMUSCULAR | Status: DC

## 2017-08-09 MED ORDER — PHENYTOIN SODIUM 50 MG/ML IJ SOLN
100.0000 mg | Freq: Three times a day (TID) | INTRAMUSCULAR | Status: DC
  Administered 2017-08-10 – 2017-08-18 (×27): 100 mg via INTRAVENOUS
  Filled 2017-08-09 (×28): qty 2

## 2017-08-09 MED ORDER — HEPARIN SODIUM (PORCINE) 5000 UNIT/ML IJ SOLN
5000.0000 [IU] | Freq: Three times a day (TID) | INTRAMUSCULAR | Status: DC
  Administered 2017-08-09 – 2017-08-11 (×5): 5000 [IU] via SUBCUTANEOUS
  Filled 2017-08-09 (×5): qty 1

## 2017-08-09 MED ORDER — ORAL CARE MOUTH RINSE
15.0000 mL | OROMUCOSAL | Status: DC
  Administered 2017-08-09 – 2017-08-16 (×70): 15 mL via OROMUCOSAL

## 2017-08-09 MED ORDER — FENTANYL CITRATE (PF) 100 MCG/2ML IJ SOLN
100.0000 ug | INTRAMUSCULAR | Status: DC | PRN
  Administered 2017-08-13 – 2017-08-16 (×4): 100 ug via INTRAVENOUS
  Filled 2017-08-09 (×6): qty 2

## 2017-08-09 MED ORDER — ALBUTEROL SULFATE (2.5 MG/3ML) 0.083% IN NEBU: 2.5000 mg | INHALATION_SOLUTION | RESPIRATORY_TRACT | Status: DC | PRN

## 2017-08-09 MED ORDER — SODIUM CHLORIDE 0.9% FLUSH: 10.0000 mL | INTRAVENOUS | Status: DC | PRN

## 2017-08-09 NOTE — ED Notes (Signed)
Pt noted to be pulseless and in vtach

## 2017-08-09 NOTE — ED Triage Notes (Signed)
[facility] new onset [facility]. Per EMS pt was standing in line with his son, and per son pt speech was "gargled", pt "spun around a few times" and then fell to the ground and proceeded to have a seizure. Per EMS pt was postictal on arrival. Pt does not have hx of seizures but does have hx of stroke. Pt oriented to person, disoriented to time, place, and situation. Pt alert and in NAD on arrival.

## 2017-08-09 NOTE — Procedures (Addendum)
History: 62 yo M with seizures and cardiac arrest.   Sedation: None  Technique: This is a 21 channel routine scalp EEG performed at the bedside with bipolar and monopolar montages arranged in accordance to the international 10/20 system of electrode placement. One channel was dedicated to EKG recording.    Background: There are bilateral independent frontotemporal periodic discharges with a frequency of 0.5-1 Hz with spike and wave or sharp wave morphology. In addition there are frequent left frontotemporal discharges maximal at [facility] >F3 >Fp1 consisting of initially alpha range activity progressing to sharp high amplitude theta and then evolve into delta with abrupt cessation lasting about 30 seconds each time. There are 7 such ictal discharges during this 20 minute recording.   Despite this, there is a posterior dominant rhythm that is seen bilaterally with a frequency of 9-10 Hz.  Photic stimulation: Physiologic driving is now performed  EEG Abnormalities: 1) frequent left frontotemporal seizures 2) BiPLEDs   Clinical Interpretation: This EEG recorded frequent left frontotemporal seizures bridged by periodic discharges lying on the ictal-interictal continuum consistent with partial status epilepticus in the left frontotemporal region.  In addition there is independent evidence of an area of severe cortical irritability in the right temporal region  Ritta SlotMcNeill Ark Agrusa, MD [facility] [phone]   If 7pm- 7am, please page neurology on call as listed in AMION.

## 2017-08-09 NOTE — ED Provider Notes (Signed)
[facility] EMERGENCY DEPARTMENT Provider Note   CSN: [id number] Arrival date & time: 08/09/17  1255     History   Chief Complaint Chief Complaint  Patient presents with  . Seizures    HPI [name] Cruz is a 62 y.o. male.  62 yo male brought in by EMS. Patient's son is at bedside and provides history, patient's mother is at bedside as well. Per son, patient had a large stroke in 2007, has residual speech difficulties. Son states he noticed a change in patient's behavior at 11:15AM today while riding in the truck- put on his grandson's sunglasses unexpectedly and laughed, also struggled to say "skill saw" and laughed at himself trying to say this. Patient asked for an aspirin, son drove him to [employer], upon arrival patient did not get out of the truck as expected, was ambulatory without assistance into the store, son states patient was holding his right arm flexed at the elbow and would not put his arm down. Patient walked into the store towards the aspirin, stopped and turned and walked another directed- seem disoriented to son. At checkout- patient continued to walk without stopping to checkout, was called back to the register by his son, patient then began to spin in circles slowly and then looked as if he was going to pass out/fall and his son helped him to the ground where he had a seizure described as whole body shaking/arms clenched, did have bleeding of the tongue, no loss of bladder control, episode lasted approx 1 minute and EMS reported patient to be postictal on arrival.     Past Medical History:  Diagnosis Date  . Cognitive impairment 2007   after stroke, saw rehab but told to stop because was too upsetting to him  . History of chicken pox   . HLD (hyperlipidemia)   . HTN (hypertension)   . NSTEMI (non-ST elevated myocardial infarction) (HCC) 02/21/2017  . Obesity   . Stroke, hemorrhagic (HCC) 2007   thought 2/2 HTN (240sbp); residual cognitive  impairment, loss of R peripheral field, no driving    Patient Active Problem List   Diagnosis Date Noted  . Cardiac arrest (HCC) 08/09/2017  . CAD (coronary artery disease), native coronary artery   . S/P CABG x 3 02/24/2017  . NSTEMI (non-ST elevated myocardial infarction) (HCC) 02/21/2017  . Atrial fibrillation with RVR (HCC)   . Medicare annual wellness visit, subsequent 10/17/2014  . Health maintenance examination 10/17/2014  . Advanced care planning/counseling discussion 10/17/2014  . Encephalomalacia 10/28/2012  . Seizures (HCC) 04/26/2012  . Habitual alcohol use 04/26/2012  . History of stroke with current residual effects   . Cognitive impairment   . HLD (hyperlipidemia)   . HTN (hypertension)   . Obesity, Class I, BMI 30-34.9     Past Surgical History:  Procedure Laterality Date  . ANKLE SURGERY  1990s   right foot with plate and screws  . CORONARY ARTERY BYPASS GRAFT N/A 02/24/2017   3v Procedure: CORONARY ARTERY BYPASS GRAFTING (CABG) x 3 ON PUMP USING LEFT INTERNAL MAMMARY ARTERY TO LEFT ANTERIOR DESENDING CORNARY ARTERY, RIGHT GREATER SAPHENOUS VEIN TO LEFT CIRCUMFLEX ARTERY AND POSTERIOR DESENDING ARTERY. RIGHT GREATER SAPHENOUS VEIN OBTAINED VIA ENDOVEIN HARVEST.;  Surgeon: Delight Ovens, MD  . IABP INSERTION N/A 02/21/2017   Procedure: IABP Insertion;  Surgeon: Yvonne Kendall, MD;  Location: [facility] INVASIVE CV LAB;  Service: Cardiovascular;  Laterality: N/A;  . LEFT HEART CATH AND CORONARY ANGIOGRAPHY N/A 02/21/2017  Procedure: LEFT HEART CATH AND CORONARY ANGIOGRAPHY;  Surgeon: Yvonne Kendall, MD;  Location: [facility] INVASIVE CV LAB;  Service: Cardiovascular;  Laterality: N/A;  . TEE WITHOUT CARDIOVERSION N/A 02/24/2017   Procedure: TRANSESOPHAGEAL ECHOCARDIOGRAM (TEE);  Surgeon: Delight Ovens, MD;  Location: [facility] OR;  Service: Open Heart Surgery;  Laterality: N/A;        Home Medications    Prior to Admission medications   Medication Sig Start Date End Date  Taking? Authorizing Provider  aspirin EC 325 MG EC tablet Take 1 tablet (325 mg total) by mouth daily. 03/02/17  Yes Gold, Wayne E, PA-C  atorvastatin (LIPITOR) 80 MG tablet TAKE 1 TABLET (80 MG TOTAL) BY MOUTH DAILY. FOR CHOLESTEROL Patient taking differently: Take 80 mg by mouth daily. FOR CHOLESTEROL 03/20/17  Yes Robbie Lis M, PA-C  carvedilol (COREG) 3.125 MG tablet Take 1 tablet (3.125 mg total) by mouth 2 (two) times daily with a meal. 03/20/17  Yes Robbie Lis M, PA-C  ezetimibe (ZETIA) 10 MG tablet Take 1 tablet (10 mg total) by mouth daily. 05/16/17 08/14/17 Yes Simmons, Brittainy M, PA-C  losartan (COZAAR) 25 MG tablet Take 1 tablet (25 mg total) by mouth daily. 03/20/17  Yes Robbie Lis M, PA-C  oxyCODONE (OXY IR/ROXICODONE) 5 MG immediate release tablet Take 1-2 tablets (5-10 mg total) by mouth every 6 (six) hours as needed for severe pain. Patient not taking: Reported on 07/12/2017 03/02/17   Rowe Clack, PA-C  phenytoin (DILANTIN) 100 MG ER capsule Take 100 mg by mouth 3 (three) times daily.    [provider]    Family History Family History  Problem Relation Age of Onset  . Alzheimer's disease Maternal Grandfather   . Cancer Mother        lymphoma  . Alcohol abuse Father        smoker  . Coronary artery disease Neg Hx   . Stroke Neg Hx   . Diabetes Neg Hx     Social History Social History   Tobacco Use  . Smoking status: Never Smoker  . Smokeless tobacco: Former Engineer, water Use Topics  . Alcohol use: Yes    Alcohol/week: 8.4 oz    Types: 12 Cans of beer, 2 Shots of liquor per week    Comment: regular alcohol use  . Drug use: No     Allergies   Patient has no known allergies.   Review of Systems Review of Systems  Unable to perform ROS: Other (stroke deficits)  Constitutional: Negative for fever.  Cardiovascular: Negative for chest pain.  Gastrointestinal: Negative for vomiting.  Skin: Negative for wound.    Allergic/Immunologic: Negative for immunocompromised state.  Neurological: Positive for seizures.  Psychiatric/Behavioral: Positive for confusion.     Physical Exam Updated Vital Signs BP (!) 126/97   Pulse 91   Temp 98.3 F (36.8 C) (Oral)   Resp 19   Ht 5\' 2"  (1.575 m)   Wt 68.9 kg (152 lb)   SpO2 97%   BMI 27.80 kg/m   Physical Exam  Constitutional: He appears well-developed and well-nourished.  HENT:  Head: Normocephalic.  Mouth/Throat: Mucous membranes are normal.    Eyes: Pupils are equal, round, and reactive to light. Conjunctivae and EOM are normal.  Cardiovascular: Normal rate, regular rhythm, normal heart sounds and intact distal pulses.  No murmur heard. Pulmonary/Chest: Effort normal and breath sounds normal. No respiratory distress.  Abdominal: Soft. He exhibits no distension. There is no tenderness.  Musculoskeletal:  He exhibits no tenderness or deformity.  Neurological: He is alert. He has normal strength. No cranial nerve deficit or sensory deficit. GCS eye subscore is 4. GCS verbal subscore is 4. GCS motor subscore is 5.  Blind in right eye, attempts to follow commands, struggles with finger to nose bilaterally (touches forehead then tries to touch finger), unable to assess peripheral vision due to difficulty following commands- turns head and looks for finger. Equal arm and leg strength, no arm or leg drift.  Skin: Skin is warm and dry.  Psychiatric: He has a normal mood and affect.  Nursing note and vitals reviewed.    ED Treatments / Results  Labs (all labs ordered are listed, but only abnormal results are displayed) Labs Reviewed  BASIC METABOLIC PANEL - Abnormal; Notable for the following components:      Result Value   Sodium 132 (*)    Glucose, Bld 206 (*)    Calcium 8.5 (*)    All other components within normal limits  CBC - Abnormal; Notable for the following components:   RBC 3.61 (*)    Hemoglobin 11.2 (*)    HCT 34.9 (*)    All  other components within normal limits  URINALYSIS, ROUTINE W REFLEX MICROSCOPIC - Abnormal; Notable for the following components:   APPearance HAZY (*)    Ketones, ur 5 (*)    Protein, ur 100 (*)    Bacteria, UA RARE (*)    All other components within normal limits  CBG MONITORING, ED - Abnormal; Notable for the following components:   Glucose-Capillary 200 (*)    All other components within normal limits  CULTURE, BLOOD (ROUTINE X 2)  CULTURE, BLOOD (ROUTINE X 2)  CULTURE, RESPIRATORY  MAGNESIUM  PROTIME-INR  APTT  BLOOD GAS, ARTERIAL  TROPONIN I  TROPONIN I  TROPONIN I  BASIC METABOLIC PANEL  BASIC METABOLIC PANEL  BASIC METABOLIC PANEL  BASIC METABOLIC PANEL  BASIC METABOLIC PANEL  BASIC METABOLIC PANEL  BASIC METABOLIC PANEL  PROTIME-INR  PROTIME-INR  APTT  APTT  BLOOD GAS, ARTERIAL  BLOOD GAS, ARTERIAL  URINALYSIS, ROUTINE W REFLEX MICROSCOPIC    EKG EKG Interpretation  Date/Time:  Saturday August 09 2017 14:18:09 EDT Ventricular Rate:  95 PR Interval:    QRS Duration: 102 QT Interval:  339 QTC Calculation: 427 R Axis:   -34 Text Interpretation:  Sinus rhythm Left axis deviation Nonspecific T abnrm, anterolateral leads Confirmed by Rolan BuccoBelfi, Melanie [id number]) on 08/09/2017 3:12:21 PM   Radiology Ct Head Wo Contrast  Result Date: 08/09/2017 CLINICAL DATA:  New onset seizures.  Fall to ground. EXAM: CT HEAD WITHOUT CONTRAST TECHNIQUE: Contiguous axial images were obtained from the base of the skull through the vertex without intravenous contrast. COMPARISON:  04/26/2012 head CT. FINDINGS: Brain: Chronic ventriculomegaly involving the left greater than right lateral ventricles, unchanged since 2014 head CT, compatible with left-sided temporal occipital encephalomalacia and central volume loss. Stable coarse focal plaque-like calcification at the lateral margin of the left lateral ventricle. No evidence of parenchymal hemorrhage or extra-axial fluid collection. No mass  lesion, mass effect, or midline shift. No CT evidence of acute infarction. Nonspecific moderate subcortical and periventricular white matter hypodensity, most in keeping with chronic small vessel ischemic change. Vascular: No acute abnormality. Skull: No evidence of calvarial fracture. Sinuses/Orbits: The visualized paranasal sinuses are essentially clear. Other: Stable chronic partial left mastoid effusion. Clear right mastoid air cells. IMPRESSION: 1. No evidence of acute intracranial abnormality. No evidence of  calvarial fracture. 2. Stable chronic ventriculomegaly compatible with left temporal occipital encephalomalacia and central volume loss. 3. Moderate chronic small vessel ischemic changes in the cerebral white matter. Electronically Signed   By: Delbert Phenix M.D.   On: 08/09/2017 14:16   Dg Chest Portable 1 View  Result Date: 08/09/2017 CLINICAL DATA:  Seizures.  Status post intubation. EXAM: PORTABLE CHEST 1 VIEW COMPARISON:  07/12/2017 and prior radiographs FINDINGS: An endotracheal tube is identified approximately 4 cm above the carina. An NG tube is noted entering the stomach. Cardiomegaly and CABG changes again noted. Overlying CPR board artifact obscures and limits evaluation. No definite pulmonary opacity, pleural effusion or gross pneumothorax. No acute bony abnormalities identified. IMPRESSION: Endotracheal tube and NG tube. No definite acute cardiopulmonary disease. Cardiomegaly. Electronically Signed   By: Harmon Pier M.D.   On: 08/09/2017 15:03    Procedures Procedures (including critical care time)  Medications Ordered in ED Medications  0.9 %  sodium chloride infusion (75 mL/hr Intravenous New Bag/Given 08/09/17 1347)  LORazepam (ATIVAN) 2 MG/ML injection (has no administration in time range)  propofol (DIPRIVAN) 1000 MG/100ML infusion (has no administration in time range)  sodium chloride 0.9 % bolus 1,000 mL (1,000 mLs Intravenous New Bag/Given 08/09/17 1429)  norepinephrine  (LEVOPHED) 4mg  in D5W premix infusion (has no administration in time range)  aspirin suppository 300 mg (has no administration in time range)  cisatracurium (NIMBEX) bolus via infusion 6.9 mg (has no administration in time range)    And  cisatracurium (NIMBEX) 200 mg in sodium chloride 0.9 % 200 mL (1 mg/mL) infusion (has no administration in time range)    And  cisatracurium (NIMBEX) bolus via infusion 3.4 mg (has no administration in time range)  artificial tears (LACRILUBE) ophthalmic ointment 1 application (has no administration in time range)  heparin injection 5,000 Units (has no administration in time range)  0.9 %  sodium chloride infusion (has no administration in time range)  fentaNYL (SUBLIMAZE) injection 100 mcg (has no administration in time range)  fentaNYL in NS (44mcg/ml) infusion-PREMIX (has no administration in time range)  fentaNYL (SUBLIMAZE) bolus via infusion 50 mcg (has no administration in time range)  midazolam (VERSED) injection 2 mg (has no administration in time range)  midazolam (VERSED) 50 mg in sodium chloride 0.9 % 50 mL (1 mg/mL) infusion (has no administration in time range)  midazolam (VERSED) bolus via infusion 2 mg (has no administration in time range)  LORazepam (ATIVAN) injection 1 mg (1 mg Intravenous Given 08/09/17 1400)  levETIRAcetam (KEPPRA) IVPB 1000 mg/100 mL premix (0 mg Intravenous Stopped 08/09/17 1508)  propofol (DIPRIVAN) 1000 MG/100ML infusion (5 mcg/kg/min  68.9 kg Intravenous New Bag/Given 08/09/17 1433)  EPINEPHrine (ADRENALIN) 1 MG/10ML injection (1 Syringe Intravenous Given 08/09/17 1410)  amiodarone (CORDARONE) 300 mg in dextrose 5 % 100 mL bolus (300 mg Intravenous New Bag/Given 08/09/17 1421)     Initial Impression / Assessment and Plan / ED Course  I have reviewed the triage vital signs and the nursing notes.  Pertinent labs & imaging results that were available during my care of the patient were reviewed by me  and considered in my medical decision making (see chart for details).  Clinical Course as of Aug 10 1526  Sat Aug 09, 2017  1525 62yo male brought in by EMS for seizure, no history of prior seizures. Per son at bedside, patient has been disoriented today since around 11:15AM, hx of prior CVA in 2007, not on  blood thinners. Seizure workup ordered, Called to bedside for seizure, Dr. Fredderick Phenix at beside as well- full body seizure lasting approx 1 minute, given ativan 1 mg as well as Keppra 1,000mg . Patient to CT for CT head at 1353hrs. Patient returned from CT in cardiac arrest, was resuscitated, intubated. Dr. Fredderick Phenix assisted with management of patient and consults to neurology and medicine for admission.     [LM]    Clinical Course User Index [LM] Jeannie Fend, PA-C    Final Clinical Impressions(s) / ED Diagnoses   Final diagnoses:  Seizure [facility])  Cardiac arrest [facility])    ED Discharge Orders    None       Jeannie Fend, PA-C 08/09/17 1531    Rolan Bucco, MD 08/09/17 1550

## 2017-08-09 NOTE — H&P (Signed)
PULMONARY / CRITICAL CARE MEDICINE   Name: [name] Cruz MRN: [id number] DOB: 1955-06-02    ADMISSION DATE:  08/09/2017 CONSULTATION DATE:  08/09/17  REFERRING MD:  Dr. Fredderick Phenix / EDP   CHIEF COMPLAINT:  Seizure   HISTORY OF PRESENT ILLNESS:   62 y/o M who presented to [facility] on 7/27 via EMS with reports of seizures.    Per patients son, he was in line at [employer] and then had garbled speech, spun around and fell to the ground with seizure like activity.  EMS reported the patient was post-ictal on arrival to scene.  PMH of large hemorrhagic CVA in 2007 with residual speech difficulties.  The patient was alert/oriented to self & place on arrival to ER.  While in the room, he was witnessed to have altered speech and couldn't tell the staff where he was etc. He then had a seizure and was treated with 1mg  IV ativan.  He relaxed after about 30 seconds.     He was sent for CT head and while in the scanner he had an episode of ventricular tachycardia with arrest.  CPR was initiated and he was returned to the ER.  By the time he arrived to the ER, he was in a narrow complex rhythm without a pulse.  He was not shocked.  CPR total time ~ 10 minutes. Pt treated with amiodarone, EPI x2 and intubated without drugs. During intubation, he seized another time. Initial labs-Na 132, K 3.9, CL 99, CO2 22, glucose 206, BUN 10 / sr cr 0.85, Mg 1.8, WBC 7.9, Hgb 11.2 and platelets 231.  CXR pending.  CT of the head 7/27 showed no acute abnormality, moderate chronic small vessel disease, stable chronic ventriculomegaly compatible with left temporal occipital encephalomalacia / central volume loss.   PCCM called for ICU admission.  Of note, on medication review, the patient last filled his phenytoin on 03/27/17 (takes 100mg  TID) for a three month supply.  Raises question of running out of meds?? Not confirmed.    PAST MEDICAL HISTORY :  He  has a past medical history of Cognitive impairment (2007), History of chicken pox,  HLD (hyperlipidemia), HTN (hypertension), NSTEMI (non-ST elevated myocardial infarction) (HCC) (02/21/2017), Obesity, Stroke [facility]), and Stroke, hemorrhagic (HCC) (2007).  PAST SURGICAL HISTORY: He  has a past surgical history that includes Ankle surgery (1990s); LEFT HEART CATH AND CORONARY ANGIOGRAPHY (N/A, 02/21/2017); IABP Insertion (N/A, 02/21/2017); Coronary artery bypass graft (N/A, 02/24/2017); and TEE without cardioversion (N/A, 02/24/2017).  No Known Allergies  No current facility-administered medications on file prior to encounter.    Current Outpatient Medications on File Prior to Encounter  Medication Sig  . aspirin EC 325 MG EC tablet Take 1 tablet (325 mg total) by mouth daily.  Marland Kitchen atorvastatin (LIPITOR) 80 MG tablet TAKE 1 TABLET (80 MG TOTAL) BY MOUTH DAILY. FOR CHOLESTEROL  . carvedilol (COREG) 3.125 MG tablet Take 1 tablet (3.125 mg total) by mouth 2 (two) times daily with a meal.  . ezetimibe (ZETIA) 10 MG tablet Take 1 tablet (10 mg total) by mouth daily.  . furosemide (LASIX) 40 MG tablet Take 40 mg by mouth.  . losartan (COZAAR) 25 MG tablet Take 1 tablet (25 mg total) by mouth daily.  Marland Kitchen oxyCODONE (OXY IR/ROXICODONE) 5 MG immediate release tablet Take 1-2 tablets (5-10 mg total) by mouth every 6 (six) hours as needed for severe pain. (Patient not taking: Reported on 07/12/2017)    FAMILY HISTORY:  His family history  includes Alcohol abuse in his father; Alzheimer's disease in his maternal grandfather; Cancer in his mother. There is no history of Coronary artery disease, Stroke, or Diabetes.  SOCIAL HISTORY: He  reports that he has never smoked. He has quit using smokeless tobacco. He reports that he drinks about 8.4 oz of alcohol per week. He reports that he does not use drugs.  REVIEW OF SYSTEMS:  Unable to complete as patient is altered on mechanical ventilation.   SUBJECTIVE:    VITAL SIGNS: BP (!) 151/82 (BP Location: Right Arm)   Pulse 91   Temp 98.3 F (36.8 C)  (Oral)   Resp (!) 23   Ht 5\' 2"  (1.575 m)   Wt 152 lb (68.9 kg)   SpO2 94%   BMI 27.80 kg/m   HEMODYNAMICS:    VENTILATOR SETTINGS:    INTAKE / OUTPUT: No intake/output data recorded.  PHYSICAL EXAMINATION: General:  Adult male, critically ill appearing on vent Neuro:  No response to verbal stimuli, roving side to side eye movement,  HEENT:  ETT, MM pink/moist, pupils 3mm sluggish  Cardiovascular:  s1s2 rrr, distant tones, no m/r/g  Lungs:  Even/non-labored on vent, lungs bilaterally with L>R rhonchi Abdomen:  Obese/soft, BSx4 active  Musculoskeletal:  No acute deformities Skin:  Warm/dry, trace to 1+ BLE pitting edema, old scar on right ankle  LABS:  BMET Recent Labs  Lab 08/09/17 1312  NA 132*  K 3.9  CL 99  CO2 22  BUN 10  CREATININE 0.85  GLUCOSE 206*    Electrolytes Recent Labs  Lab 08/09/17 1312 08/09/17 1339  CALCIUM 8.5*  --   MG  --  1.8    CBC Recent Labs  Lab 08/09/17 1312  WBC 7.9  HGB 11.2*  HCT 34.9*  PLT 231    Coag's Recent Labs  Lab 08/09/17 1339  APTT 28  INR 1.04    Sepsis Markers No results for input(s): LATICACIDVEN, PROCALCITON, O2SATVEN in the last 168 hours.  ABG No results for input(s): PHART, PCO2ART, PO2ART in the last 168 hours.  Liver Enzymes No results for input(s): AST, ALT, ALKPHOS, BILITOT, ALBUMIN in the last 168 hours.  Cardiac Enzymes No results for input(s): TROPONINI, PROBNP in the last 168 hours.  Glucose Recent Labs  Lab 08/09/17 1307  GLUCAP 200*    Imaging Ct Head Wo Contrast  Result Date: 08/09/2017 CLINICAL DATA:  New onset seizures.  Fall to ground. EXAM: CT HEAD WITHOUT CONTRAST TECHNIQUE: Contiguous axial images were obtained from the base of the skull through the vertex without intravenous contrast. COMPARISON:  04/26/2012 head CT. FINDINGS: Brain: Chronic ventriculomegaly involving the left greater than right lateral ventricles, unchanged since 2014 head CT, compatible with  left-sided temporal occipital encephalomalacia and central volume loss. Stable coarse focal plaque-like calcification at the lateral margin of the left lateral ventricle. No evidence of parenchymal hemorrhage or extra-axial fluid collection. No mass lesion, mass effect, or midline shift. No CT evidence of acute infarction. Nonspecific moderate subcortical and periventricular white matter hypodensity, most in keeping with chronic small vessel ischemic change. Vascular: No acute abnormality. Skull: No evidence of calvarial fracture. Sinuses/Orbits: The visualized paranasal sinuses are essentially clear. Other: Stable chronic partial left mastoid effusion. Clear right mastoid air cells. IMPRESSION: 1. No evidence of acute intracranial abnormality. No evidence of calvarial fracture. 2. Stable chronic ventriculomegaly compatible with left temporal occipital encephalomalacia and central volume loss. 3. Moderate chronic small vessel ischemic changes in the cerebral white matter. Electronically  Signed   By: Delbert Phenix M.D.   On: 08/09/2017 14:16     STUDIES:  CT Head 7/27 >> no acute abnormality, moderate chronic small vessel disease, stable chronic ventriculomegaly compatible with left temporal occipital encephalomalacia / central volume loss.  ECHO 7/27 >>  CULTURES: BCx2 7/27 >>  Trach aspirate 7/27 >>  UA 7/27 >>   ANTIBIOTICS:   SIGNIFICANT EVENTS: 7/27  Admit to [facility] with seizure, suffered VT arrest in CT   LINES/TUBES: ETT 7/27 >>  DISCUSSION: 62 y/o M admitted after seizure.  While in CT, he suffered a cardiac arrest with suspected VT as initial rhythm.  Arrest thought to be ~ 10 minutes.  He was transferred back to the ER with CPR in progress from the CT scanner and on arrival had a narrow complex rhythm with no pulse. Intubated without drugs.  Seized during intubation.    ASSESSMENT / PLAN:  PULMONARY A: Acute Respiratory Insufficiency - in setting of seizure, AMS, cardiac arrest  P:    PRVC 8 cc/k  Rate 18 while on paralytics ABG one hour post intubation  Follow up CXR post central line placement  Sedation / Paralytics per hypothermia protocol  VAP prevention measures  CARDIOVASCULAR A:  Cardiac Arrest - occurred 7/27 while on CT scanner, initial rhythm thought to be VT, then PEA with narrow complex Hx CAD s/p CABG 03/2017, HTN, HLD P:  Admit ICU for hypothermia protocol  Assess ECHO, troponin  Hold home losartan, lasix, coreg, atorvastatin, zetia Continue amiodarone PT  Levophed as needed to support MAP >85 Follow EKG  RENAL A:   Hyponatremia  P:   Trend BMP / urinary output Replace electrolytes as indicated Avoid nephrotoxic agents, ensure adequate renal perfusion  GASTROINTESTINAL A:   At Risk Malnutrition  P:   NPO  Place OGT  Protonix PT for SUP  Consider feeding 7/28  HEMATOLOGIC A:   Mild Anemia  P:  Trend CBC Heparin SQ for DVT prophylaxis + SCD's   INFECTIOUS A:   No evidence infectious process on admit. R/O aspiration P:   Follow fever curve / WBC trend  Assess pan cultures on admit   ENDOCRINE A:   Hyperglycemia - mild, suspect stress response    P:   SSI while NPO / hypothermia protocol   NEUROLOGIC A:   Seizure - new on admit, ? If he had run out of phenytoin based on med review, not confirmed.  Last filled for 3 mo supply on 03/27/17.   P:   RASS goal: -5 Sedation, paralytics per hypothermia protocol  Seizure precautions  Antiepileptics per Neurology, appreciate input     FAMILY  - Updates: No family at bedside. Will update on arrival.   - Inter-disciplinary family meet or Palliative Care meeting due by: 8/2  CC Time: 35 minutes  Canary Brim, NP-C [facility] Pgr: [phone] or if no answer [phone] 08/09/2017, 2:37 PM

## 2017-08-09 NOTE — Procedures (Signed)
Arterial Catheter Insertion Procedure Note [name] Cruz [id number] 10/22/1955  Procedure: Insertion of Arterial Catheter  Indications: Blood pressure monitoring and Frequent blood sampling  Procedure Details Consent: Unable to obtain consent because of emergent medical necessity. Time Out: Verified patient identification, verified procedure, site/side was marked, verified correct patient position, special equipment/implants available, medications/allergies/relevent history reviewed, required imaging and test results available.  Performed  Maximum sterile technique was used including antiseptics, cap, gloves, gown, hand hygiene, mask and sheet. Skin prep: Chlorhexidine; local anesthetic administered 20 gauge catheter was inserted into right radial artery using the Seldinger technique. ULTRASOUND GUIDANCE USED: NO Evaluation Blood flow good; BP tracing good. Complications: No apparent complications. Placed arrow catheter on second attempt.   [name] Cruz, [name] Cruz 08/09/2017

## 2017-08-09 NOTE — ED Provider Notes (Addendum)
Procedure Name: Intubation Date/Time: 08/09/2017 3:50 PM Performed by: Rolan BuccoBelfi, Tonnette Zwiebel, MD Pre-anesthesia Checklist: Patient identified, Emergency Drugs available, Suction available and Patient being monitored Oxygen Delivery Method: Ambu bag Preoxygenation: Pre-oxygenation with 100% oxygen Ventilation: Mask ventilation without difficulty Laryngoscope Size: Glidescope and 3 Grade View: Grade I Tube type: Subglottic suction tube Tube size: 7.5 mm Number of attempts: 1 Airway Equipment and Method: Video-laryngoscopy Placement Confirmation: ETT inserted through vocal cords under direct vision,  Positive ETCO2 and Breath sounds checked- equal and bilateral Tube secured with: Tape Dental Injury: Teeth and Oropharynx as per pre-operative assessment       Patient is a 62 year old male who presented with seizure activity.  He has a prior history of hemorrhagic stroke several years ago.  He had a seizure prior to arrival.  He has no prior history of seizures.  He had a second seizure after arrival here in the emergency department this resolved with 1 mg Ativan.  He then was transported to the CT scanner and as he was in the CT scanner, he started having some runs of V. tach.  His initial EKG showed a narrow complex sinus tachycardia.  While he was in the CT scanner he went into cardiac arrest.  Compressions were started by the nursing staff.  He arrived back in the ED with compressions.  He was ventilated with a bag-valve-mask.  He was given a milligram of epinephrine.  His rhythm showed a narrow complex PEA.  He was given amiodarone given his prior runs of V. tach.  He was given a second milligram of epinephrine and compressions were continued throughout.  He was intubated.  He then regained pulses.  He remained in a narrow complex sinus rhythm.  He was started on a propofol drip and Keppra.  His blood pressure was soft and was given IV fluids as well.  His CT scan was reviewed and showed no evidence of  acute hemorrhage or other acute abnormality.  His labs are reviewed and do not show concerning findings.  I consulted Dr. Laurence SlateAroor with neurology who will see the patient and I also spoke with Dr. Franchot Erichsenosenblatt with CCM who will admit the patient to the ICU.  I updated the patient's family on the patient's condition.  Cardiopulmonary Resuscitation (CPR) Procedure Note Directed/Performed by: Rolan BuccoMelanie Gayatri Teasdale I personally directed ancillary staff and/or performed CPR in an effort to regain return of spontaneous circulation and to maintain cardiac, neuro and systemic perfusion.    CRITICAL CARE Performed by: Rolan BuccoMelanie Jocsan Mcginley Total critical care time: 45 minutes Critical care time was exclusive of separately billable procedures and treating other patients. Critical care was necessary to treat or prevent imminent or life-threatening deterioration. Critical care was time spent personally by me on the following activities: development of treatment plan with patient and/or surrogate as well as nursing, discussions with consultants, evaluation of patient's response to treatment, examination of patient, obtaining history from patient or surrogate, ordering and performing treatments and interventions, ordering and review of laboratory studies, ordering and review of radiographic studies, pulse oximetry and re-evaluation of patient's condition.    Rolan BuccoBelfi, Ishan Sanroman, MD 08/09/17 1555    Rolan BuccoBelfi, Sriyan Cutting, MD 08/09/17 1556

## 2017-08-09 NOTE — ED Notes (Signed)
Pulse check- ROSC noted

## 2017-08-09 NOTE — Consult Note (Addendum)
NEURO HOSPITALIST CONSULT NOTE   Requestig physician: Dr. Fredderick Phenix   Reason for Consult:seizure   History obtained from:  Patient Chart  And family  HPI:                                                                                                                                          [name] Cruz is an 62 y.o. male  With PMH significant for Seizures, hemorrhagic CVA 2004 ( residual cognitive deficits only), alcohol abuse who presents to [facility] via EMS with seizures.  Per family patient was in [employer] [place] where he didn't feel quite right spun around twice, fell to the floor and began to have a seizure. Transported to [facility] via EMS.  Upon arrival to [facility] patient had a second seizure witnessed by staff  That lasted approximately 30 seconds where his eyes rolled to the back of his head and he began having a tonic/clonic seizure. 1 mg ativan given at this time and patient was taken to CT. In route to CT patient began having runs of V-tach. While in CT patient was post-ictal with roving eye movements, and then went into pulseless v-tach. At this time chest compressions were started. Once patient was back in room in the process of being intubated he began to have another tonic/clonic seizure. Patient was intubated without any medications. 1g of keppra was given, propofol started at 62mcg/min/kg ( unable to titrate up due to hypotension). Family states that patient does not have a history of seizures. Chart review revealed that patient was seen [facility] by Dr. Thad Ranger in 2014 for seizures. Patient was discharged on Dilantin at that time with follow-up  appointment with  Outpatient neurology [facility].  Patient last filled phenytoin 100 mg TID prescription in march and it would have run out in June. Questioning compliance.  Ed course: NA: 132 Glucose:206, Ca: 8.5 UA: lightly positive; bacteria: rare protein: 100, hyaline cast: present CT head: no intracranial abnormality, stable chronic  ventriculomegaly with left temporal occipital encephalomalacia and central volume loss  Past Medical History:  Diagnosis Date  . Cognitive impairment 2007   after stroke, saw rehab but told to stop because was too upsetting to him  . History of chicken pox   . HLD (hyperlipidemia)   . HTN (hypertension)   . NSTEMI (non-ST elevated myocardial infarction) (HCC) 02/21/2017  . Obesity   . Stroke, hemorrhagic (HCC) 2007   thought 2/2 HTN (240sbp); residual cognitive impairment, loss of R peripheral field, no driving    Past Surgical History:  Procedure Laterality Date  . ANKLE SURGERY  1990s   right foot with plate and screws  . CORONARY ARTERY BYPASS GRAFT N/A 02/24/2017   3v Procedure: CORONARY ARTERY BYPASS GRAFTING (CABG) x 3 ON PUMP USING  LEFT INTERNAL MAMMARY ARTERY TO LEFT ANTERIOR DESENDING CORNARY ARTERY, RIGHT GREATER SAPHENOUS VEIN TO LEFT CIRCUMFLEX ARTERY AND POSTERIOR DESENDING ARTERY. RIGHT GREATER SAPHENOUS VEIN OBTAINED VIA ENDOVEIN HARVEST.;  Surgeon: Delight OvensGerhardt, Edward B, MD  . IABP INSERTION N/A 02/21/2017   Procedure: IABP Insertion;  Surgeon: Yvonne KendallEnd, Christopher, MD;  Location: [facility] INVASIVE CV LAB;  Service: Cardiovascular;  Laterality: N/A;  . LEFT HEART CATH AND CORONARY ANGIOGRAPHY N/A 02/21/2017   Procedure: LEFT HEART CATH AND CORONARY ANGIOGRAPHY;  Surgeon: Yvonne KendallEnd, Christopher, MD;  Location: [facility] INVASIVE CV LAB;  Service: Cardiovascular;  Laterality: N/A;  . TEE WITHOUT CARDIOVERSION N/A 02/24/2017   Procedure: TRANSESOPHAGEAL ECHOCARDIOGRAM (TEE);  Surgeon: Delight OvensGerhardt, Edward B, MD;  [facility];  Service: Open Heart Surgery;  Laterality: N/A;    Family History  Problem Relation Age of Onset  . Alzheimer's disease Maternal Grandfather   . Cancer Mother        lymphoma  . Alcohol abuse Father        smoker  . Coronary artery disease Neg Hx   . Stroke Neg Hx   . Diabetes Neg Hx     Social History:  reports that he has never smoked. He has quit using smokeless tobacco.  He reports that he drinks about 8.4 oz of alcohol per week. He reports that he does not use drugs.  No Known Allergies  MEDICATIONS:                                                                                                                     Current Facility-Administered Medications  Medication Dose Route Frequency Provider Last Rate Last Dose  . 0.9 %  sodium chloride infusion  75 mL/hr Intravenous Continuous Jeannie FendMurphy, Laura A, PA-C 75 mL/hr at 08/09/17 1347 75 mL/hr at 08/09/17 1347  . LORazepam (ATIVAN) 2 MG/ML injection           . midazolam (VERSED) 50 mg in sodium chloride 0.9 % 50 mL (1 mg/mL) infusion  2 mg/hr Intravenous Continuous Aroor, Georgiana SpinnerSushanth R, MD      . propofol (DIPRIVAN) 1000 MG/100ML infusion           . sodium chloride 0.9 % bolus 1,000 mL  1,000 mL Intravenous Once Rolan BuccoBelfi, Melanie, MD 984 mL/hr at 08/09/17 1429 1,000 mL at 08/09/17 1429   Current Outpatient Medications  Medication Sig Dispense Refill  . aspirin EC 325 MG EC tablet Take 1 tablet (325 mg total) by mouth daily.    Marland Kitchen. atorvastatin (LIPITOR) 80 MG tablet TAKE 1 TABLET (80 MG TOTAL) BY MOUTH DAILY. FOR CHOLESTEROL (Patient taking differently: Take 80 mg by mouth daily. FOR CHOLESTEROL) 90 tablet 1  . carvedilol (COREG) 3.125 MG tablet Take 1 tablet (3.125 mg total) by mouth 2 (two) times daily with a meal. 1810 tablet 1  . ezetimibe (ZETIA) 10 MG tablet Take 1 tablet (10 mg total) by mouth daily. 90 tablet 3  . losartan (COZAAR) 25 MG tablet Take 1 tablet (25 mg  total) by mouth daily. 90 tablet 1  . oxyCODONE (OXY IR/ROXICODONE) 5 MG immediate release tablet Take 1-2 tablets (5-10 mg total) by mouth every 6 (six) hours as needed for severe pain. (Patient not taking: Reported on 07/12/2017) 30 tablet 0      ROS:                                                                                                                                       History  unobtainable from patient due to mental  status     Blood pressure (!) 126/97, pulse 91, temperature 98.3 F (36.8 C), temperature source Oral, resp. rate 19, height 5\' 2"  (1.575 m), weight 68.9 kg (152 lb), SpO2 97 %.   General Examination:                                                                                                       Physical Exam  HEENT-  Normocephalic, no lesions, without obvious abnormality.  Normal external eye and conjunctiva.  Roving eye movements noted.  Cardiovascular- S1-S2 audible, pulses palpable throughout   Lungs-no rhonchi or wheezing noted, no excessive working breathing.  Saturations within normal limits patient intubated, on 2 mcg of propofol. Extremities- Warm, dry and intact Musculoskeletal-no joint tenderness, deformity or swelling Skin-warm and dry,intact  Neurological Examination Mental Status: Patient intubated on of propofol breathing above vent settings.  Cranial Nerves: Does not blink to threat, attempting to gag. Breathing above vent. Roving eye movements noted.  Motor/ sensory: Patient does not localize or withdraw to painful stimuli.   Lab Results: Basic Metabolic Panel: Recent Labs  Lab 08/09/17 1312 08/09/17 1339  NA 132*  --   K 3.9  --   CL 99  --   CO2 22  --   GLUCOSE 206*  --   BUN 10  --   CREATININE 0.85  --   CALCIUM 8.5*  --   MG  --  1.8    CBC: Recent Labs  Lab 08/09/17 1312  WBC 7.9  HGB 11.2*  HCT 34.9*  MCV 96.7  PLT 231    Cardiac Enzymes: No results for input(s): CKTOTAL, CKMB, CKMBINDEX, TROPONINI in the last 168 hours.  Lipid Panel: No results for input(s): CHOL, TRIG, HDL, CHOLHDL, VLDL, LDLCALC in the last 168 hours.  Imaging: Ct Head Wo Contrast  Result Date: 08/09/2017 CLINICAL DATA:  New onset seizures.  Fall to ground. EXAM: CT HEAD WITHOUT CONTRAST TECHNIQUE:  Contiguous axial images were obtained from the base of the skull through the vertex without intravenous contrast. COMPARISON:  04/26/2012 head CT.  FINDINGS: Brain: Chronic ventriculomegaly involving the left greater than right lateral ventricles, unchanged since 2014 head CT, compatible with left-sided temporal occipital encephalomalacia and central volume loss. Stable coarse focal plaque-like calcification at the lateral margin of the left lateral ventricle. No evidence of parenchymal hemorrhage or extra-axial fluid collection. No mass lesion, mass effect, or midline shift. No CT evidence of acute infarction. Nonspecific moderate subcortical and periventricular white matter hypodensity, most in keeping with chronic small vessel ischemic change. Vascular: No acute abnormality. Skull: No evidence of calvarial fracture. Sinuses/Orbits: The visualized paranasal sinuses are essentially clear. Other: Stable chronic partial left mastoid effusion. Clear right mastoid air cells. IMPRESSION: 1. No evidence of acute intracranial abnormality. No evidence of calvarial fracture. 2. Stable chronic ventriculomegaly compatible with left temporal occipital encephalomalacia and central volume loss. 3. Moderate chronic small vessel ischemic changes in the cerebral white matter. Electronically Signed   By: Delbert Phenix M.D.   On: 08/09/2017 14:16      Impression:  62 y.o. male  With PMH significant for Seizures, hemorrhagic CVA 2004 ( residual cognitive deficits only), alcohol abuse who presents to [facility] via EMS with seizures. Ct head: revealed left temporal encephalomalacia. Chart review revealed that patient has a history of seizures on dilantin 100 mg TID. His last prescription was filled in march and it would have run out in June. Most likely seizure due to medication non-compliance.  Seizures 2/2 prior hemorrhagic CVA and discontinuation of AED/noncompliance. Cardiac arres    Recommendations: - patient loaded with Keppra, will continue maintenance 1g BID - STAT EEG - wean sedation slowly - if patient has another seizure while weaning sedation, recommend loading  with Dilantin 1.5 g and starting 100mg  TID.      Valentina Lucks, MSN, NP-C [facility] [phone]  Attending neurologist's note to follow   08/09/2017, 2:51 PM   NEUROHOSPITALIST ADDENDUM Seen and examined the patient today. I have reviewed the contents of history and physical exam as documented by PA/ARNP/Resident and agree with above documentation.  I have discussed and formulated the above plan as documented. Edits to the note have been made as needed.  Patient likely had seizures in the setting of noncompliance of Dilantin/discontinuation of Dilantin by his PCP?Marland Kitchen He has old hemorrhagic CVA with evidence of right parietal encephalomalacia making increased risk of seizures.  It appears that he had at least 2 seizures while in the ER possibly 3.  Unfortunately no V. tach and then narrow complex tachycardia resulting cardiac arrest.  Hypothermia protocol being considered. He was loaded with 1 g of Keppra I would recommend continuing consider switching to Dilantin.  Maintenance dose will be 1 g twice daily.  If patient has another seizure would load with Dilantin and start in 100 mg daily maintenance.     This patient is neurologically critically ill due to presentation status epilepticus.  He is at risk for significant risk of neurological worsening from seizures, cardiac arrest, anoxic injury, complications of intubation, infection and other risks from stay in the ICU. This patient's care requires constant monitoring of vital signs, hemodynamics, respiratory and cardiac monitoring, review of multiple databases, neurological assessment, discussion with family, other specialists and medical decision making of high complexity.  I spent 55 minutes of neurocritical time in the care of this patient.     Georgiana Spinner Aroor MD [facility] [phone]  If 7pm to 7am, please call on call as listed on AMION.   ###  Reviewed stat EEG, concerning for partial  status. Loaded with Dilantin, and started maintenance 100mg  TID. Will obtain VEEG

## 2017-08-09 NOTE — Procedures (Signed)
Central Venous Catheter Insertion Procedure Note Cipriano MileBilly J Caetano [id number] 21-Feb-1955  Procedure: Insertion of Central Venous Catheter Indications: Assessment of intravascular volume, Drug and/or fluid administration and Frequent blood sampling  Procedure Details Consent: Risks of procedure as well as the alternatives and risks of each were explained to the (patient/caregiver).  Consent for procedure obtained.   Time Out: Verified patient identification, verified procedure, site/side was marked, verified correct patient position, special equipment/implants available, medications/allergies/relevent history reviewed, required imaging and test results available.  Performed  Maximum sterile technique was used including antiseptics, cap, gloves, gown, hand hygiene, mask and sheet.  Skin prep: Chlorhexidine; local anesthetic administered A antimicrobial bonded/coated triple lumen catheter was placed in the right internal jugular vein using the Seldinger technique.  Evaluation Blood flow good Complications: No apparent complications Patient did tolerate procedure well. Chest X-ray ordered to verify placement.  CXR: pending.   Procedure performed under direct supervision of Dr. Isaiah SergeMannam and with ultrasound guidance for real time vessel cannulation.      Canary BrimBrandi Zonia Caplin, NP-C [facility] Pgr: [phone] or if no answer [phone] 08/09/2017, 4:35 PM

## 2017-08-09 NOTE — Progress Notes (Signed)
LTM EEG started 

## 2017-08-09 NOTE — Progress Notes (Signed)
eLink Physician-Brief Progress Note Patient Name: [name] Cruz J Lwin DOB: 08-14-1955 MRN: [id number]   Date of Service  08/09/2017  HPI/Events of Note  Decision made not to do therapeutic hypothermia d/t patient waking up.   eICU Interventions  Will order: 1. D/C Nimbex order.  2. Change Q 2 hour BMP to Q 12 hours.      Intervention Category Major Interventions: Other:  Sommer,Steven Dennard Nipugene 08/09/2017, 8:06 PM

## 2017-08-09 NOTE — ED Notes (Signed)
[place] pads placed on pt- will start code cool protocols after temp foley placed

## 2017-08-09 NOTE — Progress Notes (Signed)
Frequent LEFT temporal seizures on spot EEG. Patient was treated with fosphenytoin with improvement on EEG, still with biPLEDs, but not initially with seizures. Subsequently has begun having RIGHT temporal seizures.   1) Add vimpat 200mg  q12h(PR interval .12 seconds) with additional 200mg  load 2) Lorazepam 4mg  IV x 1.  3) could use propofol if seizures continue.   This patient is critically ill and at significant risk of neurological worsening, death and care requires constant monitoring of vital signs, hemodynamics,respiratory and cardiac monitoring, neurological assessment, discussion with family, other specialists and medical decision making of high complexity. I spent 30 minutes of neurocritical care time  in the care of  this patient.  Ritta SlotMcNeill Kirkpatrick, MD [facility] [phone]  If 7pm- 7am, please page neurology on call as listed in AMION. 08/09/2017  10:47 PM

## 2017-08-09 NOTE — Progress Notes (Addendum)
[facility]) who is an LPN updated at bedside.  She states he may have ran out of his medications for a while but his mother just took his meds to him.  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  She states his baseline is slow to answer but appropriate.  He is "always busy" and has a good quality of life.  Has told her in the past he would not want to be "kept on machines" if he is not going to recover.  Full code for now. Explained cooling protocol to her and what to expect over the next few days. She also reports that she thought he had been taken off his seizure medications by his primary MD.   Time spent with family - approximately 20 minutes   Canary BrimBrandi Kalyn Hofstra, NP-C [facility] Pgr: [phone] or if no answer [phone] 08/09/2017, 4:38 PM

## 2017-08-09 NOTE — ED Notes (Signed)
Pulse check- pt still pulseless

## 2017-08-09 NOTE — ED Notes (Signed)
Place seizure pads on side rail

## 2017-08-09 NOTE — ED Notes (Signed)
 300mg  amiodarone bolus given

## 2017-08-09 NOTE — ED Notes (Signed)
EDP at bedside  

## 2017-08-09 NOTE — ED Notes (Signed)
Pt seizing- given 1[facility]  [facility]

## 2017-08-09 NOTE — Progress Notes (Signed)
STAT EEG completed; results pending. 

## 2017-08-09 NOTE — Progress Notes (Signed)
   08/09/17 1800  Clinical Encounter Type  Visited With Patient;Patient and family together  Visit Type Initial  Referral From Nurse  Consult/Referral To Chaplain  Spiritual Encounters  Spiritual Needs Emotional  Stress Factors  Patient Stress Factors Exhausted  Family Stress Factors Exhausted    This was a page for a 62 yr male caucasian with seazure. Pt was immediately intubated the patient's mother and sister on-site and very tearful. Chaplain provided compassionate presence. [name] Cruz a Water quality scientistMusiko-Holley, E. I. du PontChaplain

## 2017-08-10 ENCOUNTER — Inpatient Hospital Stay (HOSPITAL_COMMUNITY): Payer: Medicare Other

## 2017-08-10 DIAGNOSIS — I351 Nonrheumatic aortic (valve) insufficiency: Secondary | ICD-10-CM

## 2017-08-10 LAB — BLOOD GAS, ARTERIAL
Acid-base deficit: 2.7 mmol/L — ABNORMAL HIGH (ref 0.0–2.0)
Bicarbonate: 20.5 mmol/L (ref 20.0–28.0)
Drawn by: 414221
FIO2: 50
MECHVT: 550 mL
O2 Saturation: 98.9 %
PEEP: 5 cmH2O
PO2 ART: 204 mmHg — AB (ref 83.0–108.0)
Patient temperature: 98.6
RATE: 18 resp/min
pCO2 arterial: 29.1 mmHg — ABNORMAL LOW (ref 32.0–48.0)
pH, Arterial: 7.462 — ABNORMAL HIGH (ref 7.350–7.450)

## 2017-08-10 LAB — CBC
HEMATOCRIT: 33.1 % — AB (ref 39.0–52.0)
Hemoglobin: 11 g/dL — ABNORMAL LOW (ref 13.0–17.0)
MCH: 31.6 pg (ref 26.0–34.0)
MCHC: 33.2 g/dL (ref 30.0–36.0)
MCV: 95.1 fL (ref 78.0–100.0)
Platelets: 259 10*3/uL (ref 150–400)
RBC: 3.48 MIL/uL — ABNORMAL LOW (ref 4.22–5.81)
RDW: 15.6 % — AB (ref 11.5–15.5)
WBC: 14.8 10*3/uL — ABNORMAL HIGH (ref 4.0–10.5)

## 2017-08-10 LAB — BASIC METABOLIC PANEL
ANION GAP: 11 (ref 5–15)
Anion gap: 11 (ref 5–15)
BUN: 9 mg/dL (ref 8–23)
BUN: <5 mg/dL — ABNORMAL LOW (ref 8–23)
CALCIUM: 7.8 mg/dL — AB (ref 8.9–10.3)
CHLORIDE: 102 mmol/L (ref 98–111)
CO2: 20 mmol/L — ABNORMAL LOW (ref 22–32)
CO2: 22 mmol/L (ref 22–32)
CREATININE: 0.66 mg/dL (ref 0.61–1.24)
Calcium: 7.5 mg/dL — ABNORMAL LOW (ref 8.9–10.3)
Chloride: 105 mmol/L (ref 98–111)
Creatinine, Ser: 0.86 mg/dL (ref 0.61–1.24)
GFR calc Af Amer: >60 mL/min (ref >60)
GFR calc Af Amer: >60 mL/min (ref >60)
GFR calc non Af Amer: >60 mL/min (ref >60)
GLUCOSE: 181 mg/dL — AB (ref 70–99)
GLUCOSE: 131 mg/dL — AB (ref 70–99)
POTASSIUM: 4.3 mmol/L (ref 3.5–5.1)
Potassium: 3.5 mmol/L (ref 3.5–5.1)
SODIUM: 133 mmol/L — AB (ref 135–145)
Sodium: 138 mmol/L (ref 135–145)

## 2017-08-10 LAB — TROPONIN I
TROPONIN I: 0.28 ng/mL — AB (ref <0.03)
TROPONIN I: 0.15 ng/mL — AB (ref <0.03)

## 2017-08-10 LAB — GLUCOSE, CAPILLARY
GLUCOSE-CAPILLARY: 111 mg/dL — AB (ref 70–99)
GLUCOSE-CAPILLARY: 127 mg/dL — AB (ref 70–99)
GLUCOSE-CAPILLARY: 172 mg/dL — AB (ref 70–99)
Glucose-Capillary: 186 mg/dL — ABNORMAL HIGH (ref 70–99)
Glucose-Capillary: 114 mg/dL — ABNORMAL HIGH (ref 70–99)

## 2017-08-10 LAB — PHOSPHORUS: Phosphorus: 3 mg/dL (ref 2.5–4.6)

## 2017-08-10 LAB — ECHOCARDIOGRAM COMPLETE
HEIGHTINCHES: 62 in
Weight: 2716.07 oz

## 2017-08-10 LAB — MAGNESIUM: Magnesium: 1.8 mg/dL (ref 1.7–2.4)

## 2017-08-10 MED ORDER — LORAZEPAM 2 MG/ML IJ SOLN
INTRAMUSCULAR | Status: AC
  Filled 2017-08-10: qty 2

## 2017-08-10 MED ORDER — MIDAZOLAM HCL 50 MG/10ML IJ SOLN
5.0000 mg/h | INTRAMUSCULAR | Status: DC
  Administered 2017-08-10 – 2017-08-13 (×7): 5 mg/h via INTRAVENOUS
  Filled 2017-08-10 (×7): qty 10

## 2017-08-10 MED ORDER — LORAZEPAM 2 MG/ML IJ SOLN
4.0000 mg | Freq: Once | INTRAMUSCULAR | Status: AC
  Administered 2017-08-10: 4 mg via INTRAVENOUS
  Filled 2017-08-10: qty 2

## 2017-08-10 MED ORDER — LEVETIRACETAM IN NACL 1000 MG/100ML IV SOLN
1000.0000 mg | Freq: Two times a day (BID) | INTRAVENOUS | Status: DC
  Administered 2017-08-10 – 2017-08-18 (×17): 1000 mg via INTRAVENOUS
  Filled 2017-08-10 (×18): qty 100

## 2017-08-10 MED ORDER — LORAZEPAM 2 MG/ML IJ SOLN
4.0000 mg | Freq: Once | INTRAMUSCULAR | Status: AC
  Administered 2017-08-10: 4 mg via INTRAVENOUS

## 2017-08-10 MED ORDER — NOREPINEPHRINE 16 MG/250ML-% IV SOLN
0.0000 ug/min | INTRAVENOUS | Status: DC
  Administered 2017-08-10: 17 ug/min via INTRAVENOUS
  Administered 2017-08-11: 7 ug/min via INTRAVENOUS
  Filled 2017-08-10 (×3): qty 250

## 2017-08-10 MED ORDER — CHLORHEXIDINE GLUCONATE CLOTH 2 % EX PADS
6.0000 | MEDICATED_PAD | Freq: Every day | CUTANEOUS | Status: DC
  Administered 2017-08-11 – 2017-08-14 (×5): 6 via TOPICAL

## 2017-08-10 MED FILL — Medication: Qty: 1 | Status: AC

## 2017-08-10 NOTE — Progress Notes (Addendum)
PULMONARY / CRITICAL CARE MEDICINE   Name: [name] Cruz MRN: [id number] DOB: Nov 09, 1955    ADMISSION DATE:  08/09/2017 CONSULTATION DATE:  08/09/17  REFERRING MD:  Dr. Fredderick Phenix / EDP   CHIEF COMPLAINT:  Seizure   HISTORY OF PRESENT ILLNESS:   62 y/o M who presented to [facility] on 7/27 via EMS with reports of seizures.    Per patients son, he was in line at [employer] and then had garbled speech, spun around and fell to the ground with seizure like activity.  EMS reported the patient was post-ictal on arrival to scene.  PMH of large hemorrhagic CVA in 2007 with residual speech difficulties.  The patient was alert/oriented to self & place on arrival to ER.  While in the room, he was witnessed to have altered speech and couldn't tell the staff where he was etc. He then had a seizure and was treated with 1mg  IV ativan.  He relaxed after about 30 seconds.     He was sent for CT head and while in the scanner he had an episode of ventricular tachycardia with arrest.  CPR was initiated and he was returned to the ER.  By the time he arrived to the ER, he was in a narrow complex rhythm without a pulse.  He was not shocked.  CPR total time ~ 10 minutes. Pt treated with amiodarone, EPI x2 and intubated without drugs. During intubation, he seized another time. Initial labs-Na 132, K 3.9, CL 99, CO2 22, glucose 206, BUN 10 / sr cr 0.85, Mg 1.8, WBC 7.9, Hgb 11.2 and platelets 231.  CXR pending.  CT of the head 7/27 showed no acute abnormality, moderate chronic small vessel disease, stable chronic ventriculomegaly compatible with left temporal occipital encephalomalacia / central volume loss.   PCCM called for ICU admission.  Of note, on medication review, the patient last filled his phenytoin on 03/27/17 (takes 100mg  TID) for a three month supply.  Raises question of running out of meds?? Not confirmed.   7/28 The patient is on the ventilator.there was some question whether his mental status was improving last night  so he  Was not cooled. The patient remains on the ventoiltor. He is on a fairly high dose opf proopfol. He continues to have freqent episodes where he appears to posture or tremor briefly. I am not sure if he is still seizing. I have put in a call to Neurology. Nop furrther cardia rrhtnias or hemodynamic instability.   PAST MEDICAL HISTORY :  He  has a past medical history of Cognitive impairment (2007), History of chicken pox, HLD (hyperlipidemia), HTN (hypertension), NSTEMI (non-ST elevated myocardial infarction) (HCC) (02/21/2017), Obesity, and Stroke, hemorrhagic (HCC) (2007).  PAST SURGICAL HISTORY: He  has a past surgical history that includes Ankle surgery (1990s); LEFT HEART CATH AND CORONARY ANGIOGRAPHY (N/A, 02/21/2017); IABP Insertion (N/A, 02/21/2017); Coronary artery bypass graft (N/A, 02/24/2017); and TEE without cardioversion (N/A, 02/24/2017).  No Known Allergies  No current facility-administered medications on file prior to encounter.    Current Outpatient Medications on File Prior to Encounter  Medication Sig  . aspirin EC 325 MG EC tablet Take 1 tablet (325 mg total) by mouth daily.  Marland Kitchen atorvastatin (LIPITOR) 80 MG tablet TAKE 1 TABLET (80 MG TOTAL) BY MOUTH DAILY. FOR CHOLESTEROL (Patient taking differently: Take 80 mg by mouth daily. FOR CHOLESTEROL)  . carvedilol (COREG) 3.125 MG tablet Take 1 tablet (3.125 mg total) by mouth 2 (two) times daily with a  meal.  . ezetimibe (ZETIA) 10 MG tablet Take 1 tablet (10 mg total) by mouth daily.  Marland Kitchen losartan (COZAAR) 25 MG tablet Take 1 tablet (25 mg total) by mouth daily.  Marland Kitchen oxyCODONE (OXY IR/ROXICODONE) 5 MG immediate release tablet Take 1-2 tablets (5-10 mg total) by mouth every 6 (six) hours as needed for severe pain. (Patient not taking: Reported on 07/12/2017)  . phenytoin (DILANTIN) 100 MG ER capsule Take 100 mg by mouth 3 (three) times daily.    FAMILY HISTORY:  His family history includes Alcohol abuse in his father; Alzheimer's  disease in his maternal grandfather; Cancer in his mother. There is no history of Coronary artery disease, Stroke, or Diabetes.  SOCIAL HISTORY: He  reports that he has never smoked. He has quit using smokeless tobacco. He reports that he drinks about 8.4 oz of alcohol per week. He reports that he does not use drugs.  REVIEW OF SYSTEMS:  Unable to complete as patient is altered on mechanical ventilation.     VITAL SIGNS: BP (!) 88/65   Pulse 66   Temp (!) 97.5 F (36.4 C)   Resp 18   Ht 5\' 2"  (1.575 m)   Wt 169 lb 12.1 oz (77 kg)   SpO2 100%   BMI 31.05 kg/m   HEMODYNAMICS: CVP:  [5 mmHg-16 mmHg] 11 mmHg  VENTILATOR SETTINGS: Vent Mode: PRVC FiO2 (%):  [50 %] 50 % Set Rate:  [14 bmp-18 bmp] 18 bmp Vt Set:  [550 mL] 550 mL PEEP:  [5 cmH20] 5 cmH20 Plateau Pressure:  [11 cmH20-18 cmH20] 16 cmH20  INTAKE / OUTPUT: I/O last 3 completed shifts: In: 1003.3 [I.V.:858.3; IV Piggyback:145] Out: 1525 [Urine:1525]  PHYSICAL EXAMINATION: General:  Adult male, critically ill appearing on vent Neuro:  No response to verbal stimuli,no further roving eye movements presenlty HEENT:  ETT, MM pink/moist, pupils 3mm sluggish  Cardiovascular:  s1s2 rrr, distant tones, no m/r/g  Lungs:  Even/non-labored on vent, lungs bilaterally with L>R rhonchi Abdomen:  Obese/soft, BSx4 active  Musculoskeletal:  No acute deformities Skin:  Warm/dry, trace to 1+ BLE pitting edema, old scar on right ankle  LABS:  BMET Recent Labs  Lab 08/09/17 1312 08/09/17 1810 08/10/17 0315  NA 132* 133* 133*  K 3.9 4.3 4.3  CL 99 101 102  CO2 22 22 20*  BUN 10 10 9   CREATININE 0.85 0.90 0.86  GLUCOSE 206* 235* 181*    Electrolytes Recent Labs  Lab 08/09/17 1312 08/09/17 1339 08/09/17 1810 08/10/17 0315  CALCIUM 8.5*  --  8.0* 7.5*  MG  --  1.8  --  1.8  PHOS  --   --   --  3.0    CBC Recent Labs  Lab 08/09/17 1312 08/10/17 0315  WBC 7.9 14.8*  HGB 11.2* 11.0*  HCT 34.9* 33.1*  PLT 231  259    Coag's Recent Labs  Lab 08/09/17 1339  APTT 28  INR 1.04      ABG Recent Labs  Lab 08/09/17 1734 08/10/17 0506  PHART 7.225* 7.462*  PCO2ART 52.7* 29.1*  PO2ART 79.0* 204*       Cardiac Enzymes Recent Labs  Lab 08/09/17 1739 08/09/17 2115 08/10/17 0315  TROPONINI 0.14* 0.28* 0.28*    Glucose Recent Labs  Lab 08/09/17 1307 08/09/17 1738 08/09/17 2011 08/09/17 2345 08/10/17 0356 08/10/17 0811  GLUCAP 200* 228* 166* 120* 186* 172*    Imaging Ct Head Wo Contrast  Result Date: 08/09/2017 CLINICAL DATA:  New onset seizures.  Fall to ground. EXAM: CT HEAD WITHOUT CONTRAST TECHNIQUE: Contiguous axial images were obtained from the base of the skull through the vertex without intravenous contrast. COMPARISON:  04/26/2012 head CT. FINDINGS: Brain: Chronic ventriculomegaly involving the left greater than right lateral ventricles, unchanged since 2014 head CT, compatible with left-sided temporal occipital encephalomalacia and central volume loss. Stable coarse focal plaque-like calcification at the lateral margin of the left lateral ventricle. No evidence of parenchymal hemorrhage or extra-axial fluid collection. No mass lesion, mass effect, or midline shift. No CT evidence of acute infarction. Nonspecific moderate subcortical and periventricular white matter hypodensity, most in keeping with chronic small vessel ischemic change. Vascular: No acute abnormality. Skull: No evidence of calvarial fracture. Sinuses/Orbits: The visualized paranasal sinuses are essentially clear. Other: Stable chronic partial left mastoid effusion. Clear right mastoid air cells. IMPRESSION: 1. No evidence of acute intracranial abnormality. No evidence of calvarial fracture. 2. Stable chronic ventriculomegaly compatible with left temporal occipital encephalomalacia and central volume loss. 3. Moderate chronic small vessel ischemic changes in the cerebral white matter. Electronically Signed   By:  Delbert PhenixJason A Poff M.D.   On: 08/09/2017 14:16   Dg Chest Port 1 View  Result Date: 08/10/2017 CLINICAL DATA:  Status post cardiac arrest EXAM: PORTABLE CHEST 1 VIEW COMPARISON:  08/09/2017 FINDINGS: Cardiac shadow is stable. Right jugular central line, endotracheal tube and nasogastric catheter are again seen and stable. The lungs are well aerated bilaterally. No focal infiltrate or sizable effusion is seen. IMPRESSION: No active disease. Electronically Signed   By: Alcide CleverMark  Lukens M.D.   On: 08/10/2017 07:17   Dg Chest Port 1 View  Result Date: 08/09/2017 CLINICAL DATA:  Post cardiac arrest. EXAM: PORTABLE CHEST 1 VIEW COMPARISON:  August 09, 2017 FINDINGS: An ET tube terminates 2.6 cm above the carina in good position. A new right central line is noted terminating in the central SVC. No pneumothorax. A transcutaneous pacer partially obscures the right chest. An NG tube terminates below today's film. The heart, hila, and mediastinum are unchanged. Mild interstitial prominence without overt edema. No focal infiltrate. IMPRESSION: 1. Support apparatus as above. 2. No pneumothorax after right central line placement. 3. No other acute abnormalities. Electronically Signed   By: Gerome Samavid  Williams III M.D   On: 08/09/2017 16:29   Dg Chest Portable 1 View  Result Date: 08/09/2017 CLINICAL DATA:  Seizures.  Status post intubation. EXAM: PORTABLE CHEST 1 VIEW COMPARISON:  07/12/2017 and prior radiographs FINDINGS: An endotracheal tube is identified approximately 4 cm above the carina. An NG tube is noted entering the stomach. Cardiomegaly and CABG changes again noted. Overlying CPR board artifact obscures and limits evaluation. No definite pulmonary opacity, pleural effusion or gross pneumothorax. No acute bony abnormalities identified. IMPRESSION: Endotracheal tube and NG tube. No definite acute cardiopulmonary disease. Cardiomegaly. Electronically Signed   By: Harmon PierJeffrey  Hu M.D.   On: 08/09/2017 15:03     STUDIES:  CT  Head 7/27 >> no acute abnormality, moderate chronic small vessel disease, stable chronic ventriculomegaly compatible with left temporal occipital encephalomalacia / central volume loss.  ECHO 7/27 >>  CULTURES: BCx2 7/27 >>  Trach aspirate 7/27 >>  UA 7/27 >>     SIGNIFICANT EVENTS: 7/27  [facility], suffered VT arrest in CT   LINES/TUBES: ETT 7/27 >>  DISCUSSION: 62 y/o M admitted after seizure.  While in CT, he suffered a cardiac arrest with suspected VT as initial rhythm.  Arrest thought  to be ~ 10 minutes.  He was transferred back to the ER with CPR in progress from the CT scanner and on arrival had a narrow complex rhythm with no pulse. Intubated without drugs.  Seized during intubation.    ASSESSMENT / PLAN:  PULMONARY A: Acute Respiratory Insufficiency - in setting of seizure, AMS, cardiac arrest  P:   PRVC 8 cc/k  Rate 18 while on paralytics ABG one hour post intubation  Follow up CXR post central line placement  Sedation / Paralytics per hypothermia protocol  VAP prevention measures  7/29 The patient remains heavily sedated and will likely require that for a t least another 1-2 days. The major issue is the ongoing seizure.  Patient on FI02 35%.  CXR is clear   CARDIOVASCULAR A:  Cardiac Arrest - occurred 7/27 while on CT scanner, initial rhythm thought to be VT, then PEA with narrow complex Hx CAD s/p CABG 03/2017, HTN, HLD P:  Admit ICU for hypothermia protocol  Assess ECHO, troponin  Hold home losartan, lasix, coreg, atorvastatin, zetia Continue amiodarone PT  Levophed as needed to support MAP >85 Follow EKG 7/28 His ECG today shows new broad based TWI in the precordial leads. Peak trop was 0.28. No evidence on CT scan of an acut intracrainial abnomality that woukd easily explain the ECG changes ( such as SAH).  RENAL A:   Hyponatremia  P:   Trend BMP / urinary output Replace electrolytes as indicated Avoid nephrotoxic agents, ensure  adequate renal perfusion 7/28 renal fn.is stable  GASTROINTESTINAL A:   At Risk Malnutrition  P:   NPO  Place OGT  Protonix PT for SUP  Consider feeding 7/28  HEMATOLOGIC A:   Mild Anemia  P:  Trend CBC Heparin SQ for DVT prophylaxis + SCD's  7/28 Hb is stable  INFECTIOUS A:   No evidence infectious process on admit. R/O aspiration P:   Follow fever curve / WBC trend  Assess pan cultures on admit   ENDOCRINE A:   Hyperglycemia - mild, suspect stress response    P:   SSI while NPO / hypothermia protocol   NEUROLOGIC A:   Seizure - new on admit, ? If he had run out of phenytoin based on med review, not confirmed.  Last filled for 3 mo supply on 03/27/17.   P:   RASS goal: -5 Sedation, paralytics per hypothermia protocol  Seizure precautions  Antiepileptics per Neurology, appreciate inpu. 7/28  I have heard from the nurse that they hjear the patient had been drinking as an outpt and nt taking his regular dilantin therapy at home the patiewnt is on continuous EEG per neuro. At present the patient is onn he Porpofol dripat 80mg /hr. In additionhe is getting Vimpat and Dilantin. I immagine that neurology will want a MRI before long.           CC Time:35 minutes  Jamesetta So MD [facility] Pgr: [phone] or if no answer [phone] Cell [phone] 08/10/2017, 10:02 AM

## 2017-08-10 NOTE — Progress Notes (Signed)
  Echocardiogram 2D Echocardiogram has been performed.  [name] Cruz 08/10/2017, 3:01 PM

## 2017-08-10 NOTE — Progress Notes (Signed)
Reason for consult: status epileptics   Subjective: Patient intubated and sedated. Had jerking movements while weaning sedation, so it was increased   ROS: unable to obtain 2/2 due to sedation  Examination  Vital signs in last 24 hours: Temp:  [96.8 F (36 C)-99.1 F (37.3 C)] 96.8 F (36 C) (07/28 1500) Pulse Rate:  [56-66] 56 (07/28 1511) Resp:  [13-22] 18 (07/28 1511) BP: (86-156)/(55-107) 107/55 (07/28 1511) SpO2:  [90 %-100 %] 100 % (07/28 1511) Arterial Line BP: (67-177)/(45-110) 101/52 (07/28 1500) FiO2 (%):  [35 %-50 %] 35 % (07/28 1511) Weight:  [77 kg (169 lb 12.1 oz)] 77 kg (169 lb 12.1 oz) (07/27 1657)  General: Not in distress, cooperative CVS: pulse-normal rate and rhythm RS: breathing comfortably Extremities: normal   Neuro: MS: Alert, oriented, follows commands CN: pupils equal and reactive,  EOMI, face symmetric, tongue midline, normal sensation over face, Motor: 5/5 strength in all 4 extremities Reflexes: 2+ bilaterally over patella, biceps, plantars: flexor Coordination: normal Gait: not tested  Basic Metabolic Panel: Recent Labs  Lab 08/09/17 1312 08/09/17 1339 08/09/17 1810 08/10/17 0315 08/10/17 1242  NA 132*  --  133* 133* 138  K 3.9  --  4.3 4.3 3.5  CL 99  --  101 102 105  CO2 22  --  22 20* 22  GLUCOSE 206*  --  235* 181* 131*  BUN 10  --  10 9 <5*  CREATININE 0.85  --  0.90 0.86 0.66  CALCIUM 8.5*  --  8.0* 7.5* 7.8*  MG  --  1.8  --  1.8  --   PHOS  --   --   --  3.0  --     CBC: Recent Labs  Lab 08/09/17 1312 08/10/17 0315  WBC 7.9 14.8*  HGB 11.2* 11.0*  HCT 34.9* 33.1*  MCV 96.7 95.1  PLT 231 259     Coagulation Studies: Recent Labs    08/09/17 1339  LABPROT 13.5  INR 1.04    Imaging Reviewed:   EEG: Clinical interpretation: This day 1 of intensive EEG monitoring with simultaneous video video monitoring recorded several electrographic seizures arising from right temporal region.  There was no obvious  clinical accompaniment to this electrographic seizures.  In addition there is near continues independent left anterior temporal/frontal spikes as well as right anterior temporal spikes present throughout the recording suggestive of significant cortical irritability in those regions.      ASSESSMENT AND PLAN 62 y.o. male  With PMH significant for Seizures, hemorrhagic CVA 2004 ( residual cognitive deficits only), alcohol abuse who [facility] with seizures. Ct head: revealed left temporal encephalomalacia. Chart review revealed that patient has a history of seizures on dilantin 100 mg TID, was taken off by PCP.   EEG overnight showed left temporal lobe seizures followed by Right temporal lobe seizures. He was loaded with Dilantin and then later with Vimpat.  This morning had brief tremors, posturing and while weaning propofol. His EEG has improved with high dose of propofol. Now in partial burst suppression pattern.    Status epilepticus: in the setting of  seizure disorder following hemorrhagic CVA , alcohol abuse and recently taken of Dilantin Partial burst suppression  Cardiac arrest   Recommendations: Continue Phenytoin 100mg  TID Continue Keppra 1g BID (was loaded but not on maintenance dosing) Continue Vimpat 200mg  BID Continue Propofol  Start Versed 5mg /hr  Will plan for burst suppression for 24 hrs.    This  patient is neurologically critically ill due to partial status epilepticus.  He is at risk  of  worsening seizures, cardiac arrest, anoxic injury, complications of intubation, infection and other risks from stay in the ICU.   This patient's care requires constant monitoring of vital signs, hemodynamics, respiratory and cardiac monitoring, review of multiple databases, neurological assessment, discussion with family, other specialists and medical decision making of high complexity.  I spent 50 minutes of neurocritical time in the care of this  patient.              Georgiana Spinner Farryn Linares [facility] Pager Number [phone] For questions after 7pm please refer to AMION to reach the Neurologist on call

## 2017-08-10 NOTE — Progress Notes (Signed)
LTM EEG checked, no skin breakdown seen 

## 2017-08-10 NOTE — Progress Notes (Signed)
  Amiodarone Drug - Drug Interaction Consult Note  Recommendations: Amiodarone interacts with phenytoin. Patient received phenytoin load 7/27 @1830  and restarted on PTA phenytoin. Phenytoin level undetectable on admission. Will check phenytoin level 7/29 AM to f/u.   Amiodarone is metabolized by the cytochrome P450 system and therefore has the potential to cause many drug interactions. Amiodarone has an average plasma half-life of 50 days (range 20 to 100 days).   There is potential for drug interactions to occur several weeks or months after stopping treatment and the onset of drug interactions may be slow after initiating amiodarone.   []  Statins: Increased risk of myopathy. Simvastatin- restrict dose to 20mg  daily. Other statins: counsel patients to report any muscle pain or weakness immediately.  []  Anticoagulants: Amiodarone can increase anticoagulant effect. Consider warfarin dose reduction. Patients should be monitored closely and the dose of anticoagulant altered accordingly, remembering that amiodarone levels take several weeks to stabilize.  [x]  Antiepileptics: Amiodarone can increase plasma concentration of phenytoin, the dose should be reduced. Note that small changes in phenytoin dose can result in large changes in levels. Monitor patient and counsel on signs of toxicity.  []  Beta blockers: increased risk of bradycardia, AV block and myocardial depression. Sotalol - avoid concomitant use.  []   Calcium channel blockers (diltiazem and verapamil): increased risk of bradycardia, AV block and myocardial depression.  []   Cyclosporine: Amiodarone increases levels of cyclosporine. Reduced dose of cyclosporine is recommended.  []  Digoxin dose should be halved when amiodarone is started.  []  Diuretics: increased risk of cardiotoxicity if hypokalemia occurs.  []  Oral hypoglycemic agents (glyburide, glipizide, glimepiride): increased risk of hypoglycemia. Patient's glucose levels should be  monitored closely when initiating amiodarone therapy.   []  Drugs that prolong the QT interval:  Torsades de pointes risk may be increased with concurrent use - avoid if possible.  Monitor QTc, also keep magnesium/potassium WNL if concurrent therapy can't be avoided. Marland Kitchen. Antibiotics: e.g. fluoroquinolones, erythromycin. . Antiarrhythmics: e.g. quinidine, procainamide, disopyramide, sotalol. . Antipsychotics: e.g. phenothiazines, haloperidol.  . Lithium, tricyclic antidepressants, and methadone. Thank Danella MaiersYou,   Elva Mauro R Shaida Route  08/10/2017 3:29 PM

## 2017-08-10 NOTE — Procedures (Signed)
Electroencephalography report.  Long-term monitoring  Number study day 1  CPT 95951  Recording begins 08/09/2017 at 8:25 PM Recording and 08/10/2017 at 7:53 AM  Data acquisition: [employer] 10-20 for elective placement.  18 channels EEG with EKG channel.  This EEG was requested for this patient who presents with convulsions history of intracranial hemorrhage in the past.  Status post cardiac arrest.  There was no pushbutton activation events during this recording.  Background activities were abnormal due to background activity slowing with superimposed near continues independent spikes in the left anterior temporal/frontal cortex maximum negativity at F7 and Fp1.  Spikes often occur in periodic rhythmic trains but without clear evolution.  In addition there is a independent right anterior temporal spikes maximum negativity at F7 present continuously throughout the recording at times was extended negative field and periodic fashion reminiscent of right hemispheric periodic lateralized epileptiform discharges.  In addition there is several electrographic seizures arising from right temporal region without obvious clinical accompaniment present.  At least not obvious on video monitoring.  There is a persistent faster frequencies present at midline and slight extension to the left paracentral region.  Could represent breach rhythm in that region in appropriate clinical setting.  During second half of the recording background activities evolve into more burst and attenuation in burst and suppression pattern with significant resolution of left hemispheric spikes.  Right temporal spikes still present however.  More blunted morphology and less prominent negative field.   Clinical interpretation: This day 1 of intensive EEG monitoring with simultaneous video video monitoring recorded several electrographic seizures arising from right temporal region.  There was no obvious clinical accompaniment to this  electrographic seizures.  In addition there is near continues independent left anterior temporal/frontal spikes as well as right anterior temporal spikes present throughout the recording suggestive of significant cortical irritability in those regions.  Clinical correlation is advised.

## 2017-08-11 ENCOUNTER — Inpatient Hospital Stay (HOSPITAL_COMMUNITY): Payer: Medicare Other

## 2017-08-11 DIAGNOSIS — I952 Hypotension due to drugs: Secondary | ICD-10-CM

## 2017-08-11 DIAGNOSIS — G40901 Epilepsy, unspecified, not intractable, with status epilepticus: Secondary | ICD-10-CM

## 2017-08-11 LAB — BASIC METABOLIC PANEL
Anion gap: 7 (ref 5–15)
CHLORIDE: 110 mmol/L (ref 98–111)
CO2: 25 mmol/L (ref 22–32)
CREATININE: 0.69 mg/dL (ref 0.61–1.24)
Calcium: 7.5 mg/dL — ABNORMAL LOW (ref 8.9–10.3)
GFR calc Af Amer: >60 mL/min (ref >60)
GFR calc non Af Amer: >60 mL/min (ref >60)
Glucose, Bld: 108 mg/dL — ABNORMAL HIGH (ref 70–99)
POTASSIUM: 3.3 mmol/L — AB (ref 3.5–5.1)
SODIUM: 142 mmol/L (ref 135–145)

## 2017-08-11 LAB — HEPATIC FUNCTION PANEL
ALBUMIN: 2.6 g/dL — AB (ref 3.5–5.0)
ALK PHOS: 58 U/L (ref 38–126)
ALT: 41 U/L (ref 0–44)
AST: 51 U/L — ABNORMAL HIGH (ref 15–41)
BILIRUBIN TOTAL: 0.7 mg/dL (ref 0.3–1.2)
Bilirubin, Direct: 0.3 mg/dL — ABNORMAL HIGH (ref 0.0–0.2)
Indirect Bilirubin: 0.4 mg/dL (ref 0.3–0.9)
TOTAL PROTEIN: 4.8 g/dL — AB (ref 6.5–8.1)

## 2017-08-11 LAB — GLUCOSE, CAPILLARY
GLUCOSE-CAPILLARY: 104 mg/dL — AB (ref 70–99)
GLUCOSE-CAPILLARY: 105 mg/dL — AB (ref 70–99)
GLUCOSE-CAPILLARY: 102 mg/dL — AB (ref 70–99)
Glucose-Capillary: 96 mg/dL (ref 70–99)
Glucose-Capillary: 107 mg/dL — ABNORMAL HIGH (ref 70–99)

## 2017-08-11 LAB — PHENYTOIN LEVEL, TOTAL: PHENYTOIN LVL: 12.7 ug/mL (ref 10.0–20.0)

## 2017-08-11 MED ORDER — VITAL HIGH PROTEIN PO LIQD
1000.0000 mL | ORAL | Status: DC
  Administered 2017-08-11: 1000 mL

## 2017-08-11 MED ORDER — NOREPINEPHRINE 16 MG/250ML-% IV SOLN
0.0000 ug/min | INTRAVENOUS | Status: DC
  Administered 2017-08-12: 2 ug/min via INTRAVENOUS
  Filled 2017-08-11 (×2): qty 250

## 2017-08-11 MED ORDER — NOREPINEPHRINE 4 MG/250ML-% IV SOLN
0.0000 ug/min | INTRAVENOUS | Status: DC
  Filled 2017-08-11: qty 250

## 2017-08-11 MED ORDER — CHLORHEXIDINE GLUCONATE 0.12 % MT SOLN
OROMUCOSAL | Status: AC
  Administered 2017-08-11: 15 mL
  Filled 2017-08-11: qty 15

## 2017-08-11 MED ORDER — HEPARIN SODIUM (PORCINE) 5000 UNIT/ML IJ SOLN
5000.0000 [IU] | Freq: Three times a day (TID) | INTRAMUSCULAR | Status: DC
  Administered 2017-08-11 – 2017-08-14 (×9): 5000 [IU] via SUBCUTANEOUS
  Filled 2017-08-11 (×9): qty 1

## 2017-08-11 MED ORDER — PRO-STAT SUGAR FREE PO LIQD
30.0000 mL | Freq: Two times a day (BID) | ORAL | Status: DC
  Administered 2017-08-11: 30 mL
  Filled 2017-08-11: qty 30

## 2017-08-11 MED ORDER — POTASSIUM CHLORIDE 20 MEQ/15ML (10%) PO SOLN
20.0000 meq | ORAL | Status: AC
  Administered 2017-08-11 (×2): 20 meq
  Filled 2017-08-11 (×2): qty 15

## 2017-08-11 MED ORDER — VITAL HIGH PROTEIN PO LIQD: 1000.0000 mL | ORAL | Status: DC

## 2017-08-11 MED ORDER — PRO-STAT SUGAR FREE PO LIQD
30.0000 mL | Freq: Every day | ORAL | Status: DC
  Administered 2017-08-11 – 2017-08-12 (×2): 30 mL
  Filled 2017-08-11 (×2): qty 30

## 2017-08-11 NOTE — Progress Notes (Signed)
Initial Nutrition Assessment  DOCUMENTATION CODES:   Obesity unspecified  INTERVENTION:   If unable to extubate in the next 24-48 hours:  Initiate Vital High Protein @ 10 ml/hr via OGT 30 ml Prostat x5/day  Provides: 740 kcals (1960 kcal with propofol, meets 117% of kcal needs), 99 grams protein, 202 ml free water.   NUTRITION DIAGNOSIS:   Inadequate oral intake related to inability to eat as evidenced by NPO status.  GOAL:   Patient will meet greater than or equal to 90% of their needs  MONITOR:   Diet advancement, Vent status, Labs, Weight trends, TF tolerance, I & O's  REASON FOR ASSESSMENT:   Ventilator    ASSESSMENT:   Patient with PMH significant for CVA with residual speech difficulties, HTN, HLD, and MI. Presents this admission with altered mental status and seizure-like activity. Pt was sent to CT head and while in the scanner had an episode of ventricular tachycardia with arrest.  CPR was initiated (10 minutes) and pt was intubated.    Pt requiring pressor support x1.  Currently on hypothermia protocol.  Tube feeding recommendations over feeding pt with current high rate of propofol.  Plan is to decrease rate tomorrow.  No family at bedside to obtain nutrition history.  Weight shows to be failry stable from 169-174 lb over the last year.   Patient is currently intubated on ventilator support MV: 9.6 L/min Temp (24hrs), Avg:96.5 F (35.8 C), Min:95.2 F (35.1 C), Max:97.5 F (36.4 C) Propofol: 46.2 ml/hr- provides 1220 kcal   I/O: +816 ml since admit UOP: 2460 ml x 24 hrs  Medications reviewed and include: versed, propofol Labs reviewed: K 3.3 (L) corrected calcium 8.6 (L)  NUTRITION - FOCUSED PHYSICAL EXAM:    Most Recent Value  Orbital Region  No depletion  Upper Arm Region  No depletion  Thoracic and Lumbar Region  Unable to assess  Buccal Region  No depletion  Temple Region  No depletion  Clavicle Bone Region  No depletion  Clavicle and  Acromion Bone Region  No depletion  Scapular Bone Region  Unable to assess  Dorsal Hand  No depletion  Patellar Region  No depletion  Anterior Thigh Region  No depletion  Posterior Calf Region  No depletion  Edema (RD Assessment)  None  Hair  Reviewed  Eyes  Unable to assess  Mouth  Unable to assess  Skin  Reviewed  Nails  Reviewed     Diet Order:   Diet Order           Diet NPO time specified  Diet effective now          EDUCATION NEEDS:   Not appropriate for education at this time  Skin:  Skin Assessment: Reviewed RN Assessment  Last BM:  PTA  Height:   Ht Readings from Last 1 Encounters:  08/09/17 5\' 2"  (1.575 m)    Weight:   Wt Readings from Last 1 Encounters:  08/09/17 169 lb 12.1 oz (77 kg)    Ideal Body Weight:  53.6 kg  BMI:  Body mass index is 31.05 kg/m.  Estimated Nutritional Needs:   Kcal:  1669 kcal   Protein:  95-115 grams  Fluid:  >/= 1.6 L/day    Vanessa Kickarly Izreal Kock RD, LDN Clinical Nutrition Pager # - [phone]

## 2017-08-11 NOTE — Plan of Care (Signed)
  Problem: Clinical Measurements: Goal: Respiratory complications will improve Outcome: Progressing   Problem: Safety: Goal: Ability to remain free from injury will improve Outcome: Progressing   Problem: Skin Integrity: Goal: Risk for impaired skin integrity will decrease Outcome: Progressing   

## 2017-08-11 NOTE — Progress Notes (Signed)
PULMONARY / CRITICAL CARE MEDICINE   Name: [name] Cruz MRN: [id number] DOB: [birth year]    ADMISSION DATE:  08/09/2017 CONSULTATION DATE:  08/09/17  REFERRING MD:  Dr. Fredderick Phenix / EDP   CHIEF COMPLAINT:  Seizure   BRIEF SUMMARY:   62 y/o M who presented to [facility] on 7/27 via EMS with reports of seizures.    Per patients son, he was in line at [employer] and then had garbled speech, spun around and fell to the ground with seizure like activity.  EMS reported the patient was post-ictal on arrival to scene.  PMH of large hemorrhagic CVA in 2007 with residual speech difficulties.  The patient was alert/oriented to self & place on arrival to ER.  While in the room, he was witnessed to have altered speech and couldn't tell the staff where he was etc. He then had a seizure and was treated with 1mg  IV ativan.  He relaxed after about 30 seconds.     He was sent for CT head and while in the scanner he had an episode of ventricular tachycardia with arrest.  CPR was initiated and he was returned to the ER.  By the time he arrived to the ER, he was in a narrow complex rhythm without a pulse.  He was not shocked.  CPR total time ~ 10 minutes. Pt treated with amiodarone, EPI x2 and intubated without drugs. During intubation, he seized another time. Initial labs-Na 132, K 3.9, CL 99, CO2 22, glucose 206, BUN 10 / sr cr 0.85, Mg 1.8, WBC 7.9, Hgb 11.2 and platelets 231.  CXR pending.  CT of the head 7/27 showed no acute abnormality, moderate chronic small vessel disease, stable chronic ventriculomegaly compatible with left temporal occipital encephalomalacia / central volume loss.   PCCM called for ICU admission.  Of note, on medication review, the patient last filled his phenytoin on 03/27/17 (takes 100mg  TID) for a three month supply.  Raises question of running out of meds?? Not confirmed.    SUBJECTIVE: RN reports pt remains on propofol at 100 mcg's, versed at 5mg /hr. Levophed @ 68mcg's.  Continuous EEG in progress  with concern for burst suppression mixed with seizure activity.  Intubated.   VITAL SIGNS: BP 96/64   Pulse 67   Temp 99.5 F (37.5 C)   Resp 18   Ht 5\' 2"  (1.575 m)   Wt 169 lb 12.1 oz (77 kg)   SpO2 100%   BMI 31.05 kg/m   HEMODYNAMICS: CVP:  [7 mmHg-13 mmHg] 8 mmHg  VENTILATOR SETTINGS: Vent Mode: PRVC FiO2 (%):  [35 %] 35 % Set Rate:  [18 bmp] 18 bmp Vt Set:  [550 mL] 550 mL PEEP:  [5 cmH20] 5 cmH20 Plateau Pressure:  [16 cmH20-18 cmH20] 16 cmH20  INTAKE / OUTPUT: I/O last 3 completed shifts: In: 4453.9 [P.O.:120; I.V.:3773.9; NG/GT:180; IV Piggyback:380] Out: 3785 [Urine:3785]  PHYSICAL EXAMINATION: General: critically ill appearing adult male in NAD on vent   HEENT: MM pink/moist, ETT, R IJ TLC c/d/i, EEG leads in place Neuro: sedate, pupils 3-70mm briskly reactive  CV: s1s2 rrr, no m/r/g PULM: even/non-labored, lungs bilaterally coarse WU:JWJX, non-tender, bsx4 active  Extremities: warm/dry, trace BLE edema  Skin: no rashes or lesions  LABS:  BMET Recent Labs  Lab 08/10/17 0315 08/10/17 1242 08/11/17 0418  NA 133* 138 142  K 4.3 3.5 3.3*  CL 102 105 110  CO2 20* 22 25  BUN 9 <5* <5*  CREATININE 0.86 0.66 0.69  GLUCOSE 181* 131* 108*    Electrolytes Recent Labs  Lab 08/09/17 1339  08/10/17 0315 08/10/17 1242 08/11/17 0418  CALCIUM  --    < > 7.5* 7.8* 7.5*  MG 1.8  --  1.8  --   --   PHOS  --   --  3.0  --   --    < > = values in this interval not displayed.    CBC Recent Labs  Lab 08/09/17 1312 08/10/17 0315  WBC 7.9 14.8*  HGB 11.2* 11.0*  HCT 34.9* 33.1*  PLT 231 259    Coag's Recent Labs  Lab 08/09/17 1339  APTT 28  INR 1.04    ABG Recent Labs  Lab 08/09/17 1734 08/10/17 0506  PHART 7.225* 7.462*  PCO2ART 52.7* 29.1*  PO2ART 79.0* 204*    Cardiac Enzymes Recent Labs  Lab 08/09/17 2115 08/10/17 0315 08/10/17 0920  TROPONINI 0.28* 0.28* 0.15*    Glucose Recent Labs  Lab 08/10/17 1635 08/10/17 2029  08/10/17 2346 08/11/17 0406 08/11/17 0823 08/11/17 1216  GLUCAP 127* 104* 111* 105* 96 102*    Imaging No results found.   STUDIES:  CT Head 7/27 >> no acute abnormality, moderate chronic small vessel disease, stable chronic ventriculomegaly compatible with left temporal occipital encephalomalacia / central volume loss.  ECHO 7/27 >> LV normal size, mild focal basal hypertrophy of the septum, mildly reduced systolic function, LVEF 45-50%, AV moderately to severely calcified annulus, mild AR, PA peak 32 mmHg, IVC mildly dilated.   CULTURES: BCx2 7/27 >>  Trach aspirate 7/27 >>  UA 7/27 >> negative    SIGNIFICANT EVENTS: 7/27  [facility], suffered VT arrest in CT.  Had some purposeful movement after arrival to ICU, cooling protocol discontinued  7/28 EKG with new t-wave inversion 7/29 Remains on propofol, versed for burst suppression.  Requiring levo due to sedation.  Amiodarone stopped, Cards consulted  LINES/TUBES: ETT 7/27 >>  DISCUSSION: 62 y/o M admitted after seizure.  While in CT, he suffered a cardiac arrest with suspected VT as initial rhythm.  Arrest thought to be ~ 10 minutes.  He was transferred back to the ER with CPR in progress from the CT scanner and on arrival had a narrow complex rhythm with no pulse. Intubated without drugs.  Seized during intubation.  Initially was to undergo hypothermia protocol but after arriving to ICU from ER, was thought to be purposeful and hypothermia protocol discontinued.  He has had continuous EEG with persistent seizures despite propofol/versed.    ASSESSMENT / PLAN:  PULMONARY A: Acute Respiratory Insufficiency - in setting of seizure, AMS, cardiac arrest  P:   PRVC 8cc/kg  Wean PEEP / FiO2 for SpO2 88-95% Follow intermittent CXR Mental status does not permit WUA / SBT attempts  VAP prevention measures  PRN albuterol   CARDIOVASCULAR A:  Cardiac Arrest - occurred 7/27 while on CT scanner, initial rhythm  thought to be VT, then PEA with narrow complex.  Troponin essentially flat.  Prolonged QTc, New T-wave inversion  Hx CAD s/p CABG 03/2017, HTN, HLD P:  ICU monitoring  Hold home losartan, lasix, coreg, atrovastatin, zetia  Stop amiodarone with prolonged QTc and t-wave inversion, may need EP eval Levophed as needed to support MAP >65 Repeat EKG now  RENAL A:   Hyponatremia  Hypokalemia  P:   Trend BMP / urinary output Replace electrolytes as indicated, KCL 7/29 Avoid nephrotoxic agents, ensure adequate renal perfusion  GASTROINTESTINAL A:  At Risk Malnutrition  P:   NPO  KUB now to confirm tube placement  Protonix for SUP  Begin TF per Nutrition  Prostat  HEMATOLOGIC A:   Mild Anemia  P:  Repeat CBC in am   Heparin for DVT prophylaxis + SCD's   INFECTIOUS A:   No evidence infectious process on admit. R/O aspiration. P:   Follow cultures Monitor off antibiotics  Trend WBC, fever curve    ENDOCRINE A:   Hyperglycemia - mild, suspect stress response    P:   SSI   NEUROLOGIC A:   Seizure - new on admit, ? If he had run out of phenytoin based on med review, family thinks he had been out "a little while".  Last filled for 3 mo supply on 03/27/17.   ? ETOH Use  P:   RASS goal: -3 to -4 Propofol, versed for burst suppression  Defer timing of wake up exam to Neurology Seizure precautions  Continue vimpat, keppra, dilantin   CC Time:   Canary Brim, NP-C [facility] Pgr: [phone] or if no answer [phone] 08/11/2017, 1:30 PM

## 2017-08-11 NOTE — Progress Notes (Signed)
MEDICATION RELATED CONSULT NOTE - INITIAL   Pharmacy Consult for phenytoin  Indication: seizure  No Known Allergies  Patient Measurements: Height: 5\' 2"  (157.5 cm) Weight: 169 lb 12.1 oz (77 kg) IBW/kg (Calculated) : 54.6  Vital Signs: Temp: 97 F (36.1 C) (07/29 1100) Temp Source: Axillary (07/29 0800) BP: 96/64 (07/29 1100) Pulse Rate: 57 (07/29 0742) Intake/Output from previous day: 07/28 0701 - 07/29 0700 In: 3521.8 [P.O.:120; I.V.:2886.8; NG/GT:180; IV Piggyback:335] Out: 2460 [Urine:2460] Intake/Output from this shift: Total I/O In: 426.1 [I.V.:316.1; NG/GT:65; IV Piggyback:45] Out: 200 [Urine:200]  Labs: Recent Labs    08/09/17 1312 08/09/17 1339  08/10/17 0315 08/10/17 1242 08/11/17 0418  WBC 7.9  --   --  14.8*  --   --   HGB 11.2*  --   --  11.0*  --   --   HCT 34.9*  --   --  33.1*  --   --   PLT 231  --   --  259  --   --   APTT  --  28  --   --   --   --   CREATININE 0.85  --    < > 0.86 0.66 0.69  MG  --  1.8  --  1.8  --   --   PHOS  --   --   --  3.0  --   --   ALBUMIN  --   --   --   --   --  2.6*  PROT  --   --   --   --   --  4.8*  AST  --   --   --   --   --  51*  ALT  --   --   --   --   --  41  ALKPHOS  --   --   --   --   --  58  BILITOT  --   --   --   --   --  0.7  BILIDIR  --   --   --   --   --  0.3*  IBILI  --   --   --   --   --  0.4   < > = values in this interval not displayed.   Estimated Creatinine Clearance: 86.1 mL/min (by C-G formula based on SCr of 0.69 mg/dL).   Microbiology: Recent Results (from the past 720 hour(s))  MRSA PCR Screening     Status: None   Collection Time: 08/09/17  5:39 PM  Result Value Ref Range Status   MRSA by PCR NEGATIVE NEGATIVE Final    Comment:        The GeneXpert MRSA Assay (FDA approved for NASAL specimens only), is one component of a comprehensive MRSA colonization surveillance program. It is not intended to diagnose MRSA infection nor to guide or monitor treatment for MRSA  infections. Performed at [facility], [address] [place], [place] [address]   Culture, blood (Routine X 2) w Reflex to ID Panel     Status: None (Preliminary result)   Collection Time: 08/09/17  6:04 PM  Result Value Ref Range Status   Specimen Description BLOOD SITE NOT SPECIFIED  Final   Special Requests   Final    BOTTLES DRAWN AEROBIC AND ANAEROBIC Blood Culture adequate volume   Culture   Final    NO GROWTH < 24 HOURS Performed at [facility], [address] [place], [place] [address]    Report Status PENDING  Incomplete    Medical History: Past Medical History:  Diagnosis Date  . Cognitive impairment 2007   after stroke, saw rehab but told to stop because was too upsetting to him  . History of chicken pox   . HLD (hyperlipidemia)   . HTN (hypertension)   . NSTEMI (non-ST elevated myocardial infarction) (HCC) 02/21/2017  . Obesity   . Stroke, hemorrhagic (HCC) 2007   thought 2/2 HTN (240sbp); residual cognitive impairment, loss of R peripheral field, no driving   Assessment: 62 y.o.maleWith PMH significant for Seizures, hemorrhagic CVA 2004 ( residual cognitive deficits only), alcohol abuse who presents to [facility] with seizures.   Patient has history of seizures and was taking dilantin 100 tid but was taken off by PCP per neurology notes. Level <2.5 on admit.   Dilantin level this am was 12.7 corrected to 20.1 with albumin of 2.6. Dilantin does interact with amiodarone, will at this time continue to follow closely and not make any dose adjustments given aggressiveness of controlling seizures for the next 24 hours. Would consider another level later this week.   He also continues on versed at 5mg /hr, keppra 1g bid, vimpat 200mg  bid, and propofol at 100.    Goal of Therapy:  Dilantin level 10-20  Plan:  Continue dilantin 100mg  tid for now, consider dose reduction later and repeat level later this week  Sheppard Coil PharmD., BCPS Clinical  Pharmacist 08/11/2017 11:24 AM

## 2017-08-11 NOTE — Procedures (Signed)
Electroencephalography report.  Long-term monitoring  Number study day 2  CPT 95951  Recording begins 08/10/2017 at 07 53 am  Recording and 08/11/2017 at 7:30 AM  Data acquisition: [employer] 10-20 for elective placement.  18 channels EEG with EKG channel.  Day 1 This EEG was requested for this patient who presents with convulsions history of intracranial hemorrhage in the past.  Status post cardiac arrest.  There was no pushbutton activation events during this recording.  Background activities were abnormal due to background activity slowing with superimposed near continues independent spikes in the left anterior temporal/frontal cortex maximum negativity at F7 and Fp1.  Spikes often occur in periodic rhythmic trains but without clear evolution.  In addition there is a independent right anterior temporal spikes maximum negativity at F7 present continuously throughout the recording at times was extended negative field and periodic fashion reminiscent of right hemispheric periodic lateralized epileptiform discharges.  In addition there is several electrographic seizures arising from right temporal region without obvious clinical accompaniment present.  At least not obvious on video monitoring.  There is a persistent faster frequencies present at midline and slight extension to the left paracentral region.  Could represent breach rhythm in that region in appropriate clinical setting.  During second half of the recording background activities evolve into more burst and attenuation in burst and suppression pattern with significant resolution of left hemispheric spikes.  Right temporal spikes still present however.  More blunted morphology and less prominent negative field.   Day 2 : No clinical or subclinical seizures present throughout the recording.  Background activities marked by burst and suppression pattern with 4-6 ratio.  Within such bursts of cerebral activities left hemispheric sharp  waves and spikes present with maximum negativity at left frontocentral and anterior frontal cortex.  Sometimes preceded by couple seconds of fast frequencies in that region.  Clinical interpretation: This day 2 of intensive EEG monitoring with simultaneous  video monitoring did not demonstrate any clinical subclinical seizures.  Background activities marked by burst and suppression pattern suggestive of severe encephalopathy of nonspecific etiologies.  Sedation status may contribute to these findings.  Within these  burst of cerebral activities left hemispheric sharp waves and spikes present maximum negativity in the left anterior frontal and frontocentral cortex.  This finding suggestive of some degree of cortical irritability in that region.

## 2017-08-11 NOTE — Progress Notes (Addendum)
Subjective: No clinical seizure activity per nursing. Sedated on propofol and Versed.   Objective: Current vital signs: BP (!) 88/63   Pulse (!) 57   Temp (!) 96.2 F (35.7 C) (Axillary)   Resp 18   Ht '5\' 2"'$  (1.575 m)   Wt 77 kg (169 lb 12.1 oz)   SpO2 100%   BMI 31.05 kg/m  Vital signs in last 24 hours: Temp:  [95.2 F (35.1 C)-97.5 F (36.4 C)] 96.2 F (35.7 C) (07/29 0800) Pulse Rate:  [56-61] 57 (07/29 0742) Resp:  [16-18] 18 (07/29 0800) BP: (78-124)/(55-79) 88/63 (07/29 0800) SpO2:  [97 %-100 %] 100 % (07/29 0800) Arterial Line BP: (57-130)/(39-110) 83/52 (07/29 0800) FiO2 (%):  [35 %] 35 % (07/29 0742)  Intake/Output from previous day: 07/28 0701 - 07/29 0700 In: 3521.8 [P.O.:120; I.V.:2886.8; NG/GT:180; IV Piggyback:335] Out: 2460 [Urine:2460] Intake/Output this shift: Total I/O In: 105.1 [I.V.:105.1] Out: -  Nutritional status:  Diet Order           Diet NPO time specified  Diet effective now          Neurologic Exam: Sedated on propofol at 100 mcg/kg/min and Versed at 5 mg/hr.  Ment: Unresponsive to external stimuli. No spontaneous movement (sedated).  CN: Pupils 2 mm and unreactive. Minimal corneal and oculocephalic reflexes.  Motor/Sensory: Flaccid tone x 4 without movement to stimulation (sedated) Reflexes: 1+ brachioradialis bilaterally. 2+ patellae bilaterally. Toes mute.   Lab Results: Results for orders placed or performed during the hospital encounter of 08/09/17 (from the past 48 hour(s))  CBG monitoring, ED     Status: Abnormal   Collection Time: 08/09/17  1:07 PM  Result Value Ref Range   Glucose-Capillary 200 (H) 70 - 99 mg/dL  Basic metabolic panel - if new onset seizures     Status: Abnormal   Collection Time: 08/09/17  1:12 PM  Result Value Ref Range   Sodium 132 (L) 135 - 145 mmol/L   Potassium 3.9 3.5 - 5.1 mmol/L   Chloride 99 98 - 111 mmol/L   CO2 22 22 - 32 mmol/L   Glucose, Bld 206 (H) 70 - 99 mg/dL   BUN 10 8 - 23 mg/dL    Creatinine, Ser 0.85 0.61 - 1.24 mg/dL   Calcium 8.5 (L) 8.9 - 10.3 mg/dL   GFR calc non Af Amer >60 >60 mL/min   GFR calc Af Amer >60 >60 mL/min    Comment: (NOTE) The eGFR has been calculated using the CKD EPI equation. This calculation has not been validated in all clinical situations. eGFR's persistently <60 mL/min signify possible Chronic Kidney Disease.    Anion gap 11 5 - 15    Comment: Performed at [facility] [address], [place], [place] [address]  CBC - if new onset seizures     Status: Abnormal   Collection Time: 08/09/17  1:12 PM  Result Value Ref Range   WBC 7.9 4.0 - 10.5 K/uL   RBC 3.61 (L) 4.22 - 5.81 MIL/uL   Hemoglobin 11.2 (L) 13.0 - 17.0 g/dL   HCT 34.9 (L) 39.0 - 52.0 %   MCV 96.7 78.0 - 100.0 fL   MCH 31.0 26.0 - 34.0 pg   MCHC 32.1 30.0 - 36.0 g/dL   RDW 15.5 11.5 - 15.5 %   Platelets 231 150 - 400 K/uL    Comment: Performed at [facility], [place] [address] [place], [place] [address]  Magnesium  Status: None   Collection Time: 08/09/17  1:39 PM  Result Value Ref Range   Magnesium 1.8 1.7 - 2.4 mg/dL    Comment: Performed at [facility] [address] [place], [place] [address]  Protime-INR     Status: None   Collection Time: 08/09/17  1:39 PM  Result Value Ref Range   Prothrombin Time 13.5 11.4 - 15.2 seconds   INR 1.04     Comment: Performed at [facility] [address], [place], [place] [address]  APTT     Status: None   Collection Time: 08/09/17  1:39 PM  Result Value Ref Range   aPTT 28 24 - 36 seconds    Comment: Performed at [facility] [address], [place], [place] [address]  Urinalysis, Routine w reflex microscopic     Status: Abnormal   Collection Time: 08/09/17  2:35 PM  Result Value Ref Range   Color, Urine YELLOW YELLOW   APPearance HAZY (A) CLEAR   Specific Gravity, Urine 1.014 1.005 - 1.030   pH 5.0 5.0 - 8.0   Glucose, UA NEGATIVE NEGATIVE mg/dL   Hgb urine dipstick NEGATIVE  NEGATIVE   Bilirubin Urine NEGATIVE NEGATIVE   Ketones, ur 5 (A) NEGATIVE mg/dL   Protein, ur 100 (A) NEGATIVE mg/dL   Nitrite NEGATIVE NEGATIVE   Leukocytes, UA NEGATIVE NEGATIVE   RBC / HPF 0-5 0 - 5 RBC/hpf   WBC, UA 0-5 0 - 5 WBC/hpf   Bacteria, UA RARE (A) NONE SEEN   Squamous Epithelial / LPF 0-5 0 - 5   Mucus PRESENT    Hyaline Casts, UA PRESENT    Sperm, UA PRESENT     Comment: Performed at [facility], [place] [address], [place], [place] [address]  I-STAT 3, arterial blood gas (G3+)     Status: Abnormal   Collection Time: 08/09/17  5:34 PM  Result Value Ref Range   pH, Arterial 7.225 (L) 7.350 - 7.450   pCO2 arterial 52.7 (H) 32.0 - 48.0 mmHg   pO2, Arterial 79.0 (L) 83.0 - 108.0 mmHg   Bicarbonate 21.9 20.0 - 28.0 mmol/L   TCO2 23 22 - 32 mmol/L   O2 Saturation 93.0 %   Acid-base deficit 6.0 (H) 0.0 - 2.0 mmol/L   Patient temperature 98.4 F    Collection site ARTERIAL LINE    Drawn by RT    Sample type ARTERIAL   Glucose, capillary     Status: Abnormal   Collection Time: 08/09/17  5:38 PM  Result Value Ref Range   Glucose-Capillary 228 (H) 70 - 99 mg/dL  Troponin I     Status: Abnormal   Collection Time: 08/09/17  5:39 PM  Result Value Ref Range   Troponin I 0.14 (HH) <0.03 ng/mL    Comment: CRITICAL RESULT CALLED TO, READ BACK BY AND VERIFIED WITH: T GREGORY RN @ 1908 ON 08/09/17 BY HTEMOCHE Performed at [facility], [address], [place], [place] [address]   Phenytoin level, total     Status: Abnormal   Collection Time: 08/09/17  5:39 PM  Result Value Ref Range   Phenytoin Lvl <2.5 (L) 10.0 - 20.0 ug/mL    Comment: Performed at [facility] [address], [place], [place] [address]  MRSA PCR Screening     Status: None   Collection Time: 08/09/17  5:39 PM  Result Value Ref Range   MRSA by PCR  NEGATIVE NEGATIVE    Comment:        The GeneXpert MRSA Assay (FDA approved for NASAL specimens only), is one component of a comprehensive  MRSA colonization surveillance program. It is not intended to diagnose MRSA infection nor to guide or monitor treatment for MRSA infections. Performed at [facility], [place] [address] [place], [place] [address]   Culture, blood (Routine X 2) w Reflex to ID Panel     Status: None (Preliminary result)   Collection Time: 08/09/17  6:04 PM  Result Value Ref Range   Specimen Description BLOOD SITE NOT SPECIFIED    Special Requests      BOTTLES DRAWN AEROBIC AND ANAEROBIC Blood Culture adequate volume   Culture      NO GROWTH < 24 HOURS Performed at [facility], [place] [address] [place], [place] [address]    Report Status PENDING   Basic metabolic panel     Status: Abnormal   Collection Time: 08/09/17  6:10 PM  Result Value Ref Range   Sodium 133 (L) 135 - 145 mmol/L   Potassium 4.3 3.5 - 5.1 mmol/L   Chloride 101 98 - 111 mmol/L   CO2 22 22 - 32 mmol/L   Glucose, Bld 235 (H) 70 - 99 mg/dL   BUN 10 8 - 23 mg/dL   Creatinine, Ser 0.90 0.61 - 1.24 mg/dL   Calcium 8.0 (L) 8.9 - 10.3 mg/dL   GFR calc non Af Amer >60 >60 mL/min   GFR calc Af Amer >60 >60 mL/min    Comment: (NOTE) The eGFR has been calculated using the CKD EPI equation. This calculation has not been validated in all clinical situations. eGFR's persistently <60 mL/min signify possible Chronic Kidney Disease.    Anion gap 10 5 - 15    Comment: Performed at [facility] [address] [place], [place] [address]  Glucose, capillary     Status: Abnormal   Collection Time: 08/09/17  8:11 PM  Result Value Ref Range   Glucose-Capillary 166 (H) 70 - 99 mg/dL   Comment 1 Capillary Specimen   Troponin I     Status: Abnormal   Collection Time: 08/09/17  9:15 PM  Result Value Ref Range   Troponin I 0.28 (HH) <0.03 ng/mL    Comment: CRITICAL VALUE NOTED.  VALUE IS CONSISTENT WITH PREVIOUSLY REPORTED AND CALLED VALUE. Performed at [facility], [place] [address], [place], [place] [address]    Triglycerides     Status: None   Collection Time: 08/09/17 10:55 PM  Result Value Ref Range   Triglycerides 50 <150 mg/dL    Comment: Performed at [facility] [address], [place], [place] [address]  Glucose, capillary     Status: Abnormal   Collection Time: 08/09/17 11:45 PM  Result Value Ref Range   Glucose-Capillary 120 (H) 70 - 99 mg/dL   Comment 1 Capillary Specimen   Troponin I     Status: Abnormal   Collection Time: 08/10/17  3:15 AM  Result Value Ref Range   Troponin I 0.28 (HH) <0.03 ng/mL    Comment: CRITICAL VALUE NOTED.  VALUE IS CONSISTENT WITH PREVIOUSLY REPORTED AND CALLED VALUE. Performed at [facility], [place] [address] [place],  [address]   CBC     Status: Abnormal   Collection Time: 08/10/17  3:15 AM  Result Value Ref Range   WBC 14.8 (H) 4.0 - 10.5 K/uL  RBC 3.48 (L) 4.22 - 5.81 MIL/uL   Hemoglobin 11.0 (L) 13.0 - 17.0 g/dL   HCT 33.1 (L) 39.0 - 52.0 %   MCV 95.1 78.0 - 100.0 fL   MCH 31.6 26.0 - 34.0 pg   MCHC 33.2 30.0 - 36.0 g/dL   RDW 15.6 (H) 11.5 - 15.5 %   Platelets 259 150 - 400 K/uL    Comment: Performed at [facility] [address] [place], [place] [address]  Magnesium     Status: None   Collection Time: 08/10/17  3:15 AM  Result Value Ref Range   Magnesium 1.8 1.7 - 2.4 mg/dL    Comment: Performed at [facility] [address], [place], [place] [address]  Phosphorus     Status: None   Collection Time: 08/10/17  3:15 AM  Result Value Ref Range   Phosphorus 3.0 2.5 - 4.6 mg/dL    Comment: Performed at [facility] [address] [place], [place] [address]  Basic metabolic panel     Status: Abnormal   Collection Time: 08/10/17  3:15 AM  Result Value Ref Range   Sodium 133 (L) 135 - 145 mmol/L   Potassium 4.3 3.5 - 5.1 mmol/L    Comment: HEMOLYSIS AT THIS LEVEL MAY AFFECT RESULT   Chloride 102 98 - 111 mmol/L   CO2 20 (L) 22 - 32 mmol/L   Glucose, Bld 181 (H) 70 - 99 mg/dL   BUN 9 8 - 23  mg/dL   Creatinine, Ser 0.86 0.61 - 1.24 mg/dL   Calcium 7.5 (L) 8.9 - 10.3 mg/dL   GFR calc non Af Amer >60 >60 mL/min   GFR calc Af Amer >60 >60 mL/min    Comment: (NOTE) The eGFR has been calculated using the CKD EPI equation. This calculation has not been validated in all clinical situations. eGFR's persistently <60 mL/min signify possible Chronic Kidney Disease.    Anion gap 11 5 - 15    Comment: Performed at [facility] [address] [place], [place] [address]  Glucose, capillary     Status: Abnormal   Collection Time: 08/10/17  3:56 AM  Result Value Ref Range   Glucose-Capillary 186 (H) 70 - 99 mg/dL   Comment 1 Capillary Specimen    Comment 2 Notify RN   Blood gas, arterial     Status: Abnormal   Collection Time: 08/10/17  5:06 AM  Result Value Ref Range   FIO2 50.00    Delivery systems VENTILATOR    Mode PRESSURE REGULATED VOLUME CONTROL    VT 550 mL   LHR 18 resp/min   Peep/cpap 5.0 cm H20   pH, Arterial 7.462 (H) 7.350 - 7.450   pCO2 arterial 29.1 (L) 32.0 - 48.0 mmHg   pO2, Arterial 204 (H) 83.0 - 108.0 mmHg   Bicarbonate 20.5 20.0 - 28.0 mmol/L   Acid-base deficit 2.7 (H) 0.0 - 2.0 mmol/L   O2 Saturation 98.9 %   Patient temperature 98.6    Collection site A-LINE    Drawn by [id number]    Sample type ARTERIAL DRAW    Allens test (pass/fail) PASS PASS  Glucose, capillary     Status: Abnormal   Collection Time: 08/10/17  8:11 AM  Result Value Ref Range   Glucose-Capillary 172 (H) 70 - 99 mg/dL  Troponin I     Status: Abnormal   Collection Time: 08/10/17  9:20 AM  Result Value Ref Range  Troponin I 0.15 (HH) <0.03 ng/mL    Comment: CRITICAL VALUE NOTED.  VALUE IS CONSISTENT WITH PREVIOUSLY REPORTED AND CALLED VALUE. Performed at [facility], [place] [address] [place], [place] [address]   Glucose, capillary     Status: Abnormal   Collection Time: 08/10/17 12:00 PM  Result Value Ref Range   Glucose-Capillary 114 (H) 70 - 99 mg/dL   Comment 1  Notify RN   Basic metabolic panel     Status: Abnormal   Collection Time: 08/10/17 12:42 PM  Result Value Ref Range   Sodium 138 135 - 145 mmol/L   Potassium 3.5 3.5 - 5.1 mmol/L   Chloride 105 98 - 111 mmol/L   CO2 22 22 - 32 mmol/L   Glucose, Bld 131 (H) 70 - 99 mg/dL   BUN <5 (L) 8 - 23 mg/dL   Creatinine, Ser 0.66 0.61 - 1.24 mg/dL   Calcium 7.8 (L) 8.9 - 10.3 mg/dL   GFR calc non Af Amer >60 >60 mL/min   GFR calc Af Amer >60 >60 mL/min    Comment: (NOTE) The eGFR has been calculated using the CKD EPI equation. This calculation has not been validated in all clinical situations. eGFR's persistently <60 mL/min signify possible Chronic Kidney Disease.    Anion gap 11 5 - 15    Comment: Performed at [facility] [address] [place], [place] [address]  Glucose, capillary     Status: Abnormal   Collection Time: 08/10/17  4:35 PM  Result Value Ref Range   Glucose-Capillary 127 (H) 70 - 99 mg/dL  Glucose, capillary     Status: Abnormal   Collection Time: 08/10/17  8:29 PM  Result Value Ref Range   Glucose-Capillary 104 (H) 70 - 99 mg/dL  Glucose, capillary     Status: Abnormal   Collection Time: 08/10/17 11:46 PM  Result Value Ref Range   Glucose-Capillary 111 (H) 70 - 99 mg/dL   Comment 1 Capillary Specimen    Comment 2 Notify RN   Glucose, capillary     Status: Abnormal   Collection Time: 08/11/17  4:06 AM  Result Value Ref Range   Glucose-Capillary 105 (H) 70 - 99 mg/dL   Comment 1 Capillary Specimen    Comment 2 Notify RN   Basic metabolic panel     Status: Abnormal   Collection Time: 08/11/17  4:18 AM  Result Value Ref Range   Sodium 142 135 - 145 mmol/L   Potassium 3.3 (L) 3.5 - 5.1 mmol/L   Chloride 110 98 - 111 mmol/L   CO2 25 22 - 32 mmol/L   Glucose, Bld 108 (H) 70 - 99 mg/dL   BUN <5 (L) 8 - 23 mg/dL   Creatinine, Ser 0.69 0.61 - 1.24 mg/dL   Calcium 7.5 (L) 8.9 - 10.3 mg/dL   GFR calc non Af Amer >60 >60 mL/min   GFR calc Af Amer >60 >60 mL/min     Comment: (NOTE) The eGFR has been calculated using the CKD EPI equation. This calculation has not been validated in all clinical situations. eGFR's persistently <60 mL/min signify possible Chronic Kidney Disease.    Anion gap 7 5 - 15    Comment: Performed at [facility] [address], [place], [place] [address]  Hepatic function panel     Status: Abnormal   Collection Time: 08/11/17  4:18 AM  Result Value Ref Range   Total Protein 4.8 (L) 6.5 -  8.1 g/dL   Albumin 2.6 (L) 3.5 - 5.0 g/dL   AST 51 (H) 15 - 41 U/L   ALT 41 0 - 44 U/L   Alkaline Phosphatase 58 38 - 126 U/L   Total Bilirubin 0.7 0.3 - 1.2 mg/dL   Bilirubin, Direct 0.3 (H) 0.0 - 0.2 mg/dL   Indirect Bilirubin 0.4 0.3 - 0.9 mg/dL    Comment: Performed at [facility] [address], [place], [place] [address]    Recent Results (from the past 240 hour(s))  MRSA PCR Screening     Status: None   Collection Time: 08/09/17  5:39 PM  Result Value Ref Range Status   MRSA by PCR NEGATIVE NEGATIVE Final    Comment:        The GeneXpert MRSA Assay (FDA approved for NASAL specimens only), is one component of a comprehensive MRSA colonization surveillance program. It is not intended to diagnose MRSA infection nor to guide or monitor treatment for MRSA infections. Performed at [facility], [place] [address], [place], [place] [address]   Culture, blood (Routine X 2) w Reflex to ID Panel     Status: None (Preliminary result)   Collection Time: 08/09/17  6:04 PM  Result Value Ref Range Status   Specimen Description BLOOD SITE NOT SPECIFIED  Final   Special Requests   Final    BOTTLES DRAWN AEROBIC AND ANAEROBIC Blood Culture adequate volume   Culture   Final    NO GROWTH < 24 HOURS Performed at [facility], [place] [address], [place], [place] [address]    Report Status PENDING  Incomplete    Lipid Panel Recent Labs    08/09/17 2255  TRIG 50    Studies/Results: Ct Head Wo  Contrast  Result Date: 08/09/2017 CLINICAL DATA:  New onset seizures.  Fall to ground. EXAM: CT HEAD WITHOUT CONTRAST TECHNIQUE: Contiguous axial images were obtained from the base of the skull through the vertex without intravenous contrast. COMPARISON:  04/26/2012 head CT. FINDINGS: Brain: Chronic ventriculomegaly involving the left greater than right lateral ventricles, unchanged since 2014 head CT, compatible with left-sided temporal occipital encephalomalacia and central volume loss. Stable coarse focal plaque-like calcification at the lateral margin of the left lateral ventricle. No evidence of parenchymal hemorrhage or extra-axial fluid collection. No mass lesion, mass effect, or midline shift. No CT evidence of acute infarction. Nonspecific moderate subcortical and periventricular white matter hypodensity, most in keeping with chronic small vessel ischemic change. Vascular: No acute abnormality. Skull: No evidence of calvarial fracture. Sinuses/Orbits: The visualized paranasal sinuses are essentially clear. Other: Stable chronic partial left mastoid effusion. Clear right mastoid air cells. IMPRESSION: 1. No evidence of acute intracranial abnormality. No evidence of calvarial fracture. 2. Stable chronic ventriculomegaly compatible with left temporal occipital encephalomalacia and central volume loss. 3. Moderate chronic small vessel ischemic changes in the cerebral white matter. Electronically Signed   By: Ilona Sorrel M.D.   On: 08/09/2017 14:16   Dg Chest Port 1 View  Result Date: 08/10/2017 CLINICAL DATA:  Status post cardiac arrest EXAM: PORTABLE CHEST 1 VIEW COMPARISON:  08/09/2017 FINDINGS: Cardiac shadow is stable. Right jugular central line, endotracheal tube and nasogastric catheter are again seen and stable. The lungs are well aerated bilaterally. No focal infiltrate or sizable effusion is seen. IMPRESSION: No active disease. Electronically Signed   By: Inez Catalina M.D.   On: 08/10/2017 07:17    Dg Chest [facility] 1 View  Result  Date: 08/09/2017 CLINICAL DATA:  Post cardiac arrest. EXAM: PORTABLE CHEST 1 VIEW COMPARISON:  August 09, 2017 FINDINGS: An ET tube terminates 2.6 cm above the carina in good position. A new right central line is noted terminating in the central SVC. No pneumothorax. A transcutaneous pacer partially obscures the right chest. An NG tube terminates below today's film. The heart, hila, and mediastinum are unchanged. Mild interstitial prominence without overt edema. No focal infiltrate. IMPRESSION: 1. Support apparatus as above. 2. No pneumothorax after right central line placement. 3. No other acute abnormalities. Electronically Signed   By: Dorise Bullion III M.D   On: 08/09/2017 16:29   Dg Chest Portable 1 View  Result Date: 08/09/2017 CLINICAL DATA:  Seizures.  Status post intubation. EXAM: PORTABLE CHEST 1 VIEW COMPARISON:  07/12/2017 and prior radiographs FINDINGS: An endotracheal tube is identified approximately 4 cm above the carina. An NG tube is noted entering the stomach. Cardiomegaly and CABG changes again noted. Overlying CPR board artifact obscures and limits evaluation. No definite pulmonary opacity, pleural effusion or gross pneumothorax. No acute bony abnormalities identified. IMPRESSION: Endotracheal tube and NG tube. No definite acute cardiopulmonary disease. Cardiomegaly. Electronically Signed   By: Margarette Canada M.D.   On: 08/09/2017 15:03    Medications:  Scheduled: . amiodarone  200 mg Per Tube Daily  . chlorhexidine gluconate (MEDLINE KIT)  15 mL Mouth Rinse BID  . Chlorhexidine Gluconate Cloth  6 each Topical Daily  . heparin  5,000 Units Subcutaneous Q8H  . insulin aspart  0-9 Units Subcutaneous Q4H  . mouth rinse  15 mL Mouth Rinse 10 times per day  . pantoprazole sodium  40 mg Per Tube Q1200  . phenytoin (DILANTIN) IV  100 mg Intravenous Q8H  . potassium chloride  20 mEq Per Tube Q4H  . sodium chloride flush  10-40 mL Intracatheter Q12H    Continuous: . sodium chloride 50 mL/hr at 08/11/17 0800  . lacosamide (VIMPAT) IV Stopped (08/10/17 2244)  . levETIRAcetam Stopped (08/11/17 0451)  . midazolam (VERSED) infusion 5 mg/hr (08/11/17 0800)  . norepinephrine (LEVOPHED) Adult infusion 7 mcg/min (08/11/17 0800)  . propofol (DIPRIVAN) infusion 100 mcg/kg/min (08/11/17 0800)   LTM EEG report 7/28: Clinical interpretation: This day 1 of intensive EEG monitoring with simultaneous video video monitoring recorded several electrographic seizures arising from right temporal region.  There was no obvious clinical accompaniment to this electrographic seizures. In addition there is near continues independent left anterior temporal/frontal spikes as well as right anterior temporal spikes present throughout the recording suggestive of significant cortical irritability in those regions.  LTM EEG report 7/29: Pending  Assessment: Status epilepticus in the setting of seizure disorder following hemorrhagic stroke, EtOH abuse and recently having been taken off Dilantin.  1. Patient now in burst suppression on propofol at 100 mcg/kg/min and Versed at 5 mg/hr. 2. Covered with Dilantin at 100 mg q8h IV. Will continue after he has been weaned off sedation.  3. Preliminary bedside EEG review reveals a stable burst suppression pattern.  4. LTM EEG report for today is pending.   Recommendations: 1. Will continue burst suppression for additional 48 hours, then will start weaning off propofol on Tuesday morning. Goal for weaning will likely be a decrease of 20 mcg/kg/hr over 5 hours. Will monitor LTM EEG for possible seizure recrudescence at that time. 2. Will continue Versed at 5 mg/hr for possible EtOH withdrawal. Will likely transition to scheduled Ativan after extubation and then taper over 3 days.  3. Continue Dilantin at 100 mg q8h. Level is pending.    35 minutes spent in the neurological evaluation and management of this critically ill patient.  Time spent included EEG review.    LOS: 2 days   '@Electronically'$  signed: Dr. Kerney Elbe 08/11/2017  8:37 AM

## 2017-08-11 NOTE — Progress Notes (Signed)
LTM EEG checked, no skin breakdown seen. Paste added to P3

## 2017-08-11 NOTE — Progress Notes (Signed)
[facility] LAB REPLACEMENT ONLY  The patient does apply [facility] based on the criteria listed below:   1. Is GFR >/= 40 ml/min? Yes.    Patient's GFR today is >60 2. Is urine output >/= 0.5 ml/kg/hr for the last 6 hours? Yes.   Patient's UOP is 1.3 ml/kg/hr 3. Is BUN < 60 mg/dL? Yes.    Patient's BUN today is <5 4. Abnormal electrolyte(s): k 3.3 5. Ordered repletion with: protocol 6. If a panic level lab has been reported, has the CCM MD in charge been notified? No..   Physician:    Markus DaftWHELAN, Micaila Ziemba A 08/11/2017 6:22 AM

## 2017-08-11 NOTE — Consult Note (Signed)
CARDIOLOGY CONSULT NOTE  Patient ID: [name] Cruz MRN: [id number] DOB/AGE: 1955/04/22 62 y.o.  Admit date: 08/09/2017 Primary Physician [name], MD Primary Cardiologist   Lance Muss, MD Chief Complaint  Ventricular tachycardia Requesting  Dr. Tonia Brooms  HPI:  [name] Cruz is a 62 y.o. male  with history ofhemorrhagic stroke (thought to be secondary to uncontrolled hypertension now with residual cognitive impairment), hypertension, hyperlipidemia, CAD and atrial fib.  He had atrial fib in Feb of this year.  He had an NSTEMI and was found to have 3 vessel CAD.  He had CABG.  He had a mildy reduced EF of 45% at that time.   He was admitted now with altered mentation and a seizure.  He had a another seizure in the ED.  In the CT scanner he had a reported cardiac arrest with ventricular tachycardia.  CPR was initiated.  PEA was documented.  He was treated with amio, epi and intubated.  He has developed diffuse T wave inversions on his EKG and significant QT prolongation which is new compared with previous.   Of note he has had continuous EEG and is having ongoing seizures noted.    The patient is intubated and sedated.  No family at the bedside.  The history is obtained from the chart.  His contact number is his sister but she was not at the scene when he had his event.    Past Medical History:  Diagnosis Date  . Cognitive impairment 2007   after stroke, saw rehab but told to stop because was too upsetting to him  . History of chicken pox   . HLD (hyperlipidemia)   . HTN (hypertension)   . NSTEMI (non-ST elevated myocardial infarction) (HCC) 02/21/2017  . Obesity   . Stroke, hemorrhagic (HCC) 2007   thought 2/2 HTN (240sbp); residual cognitive impairment, loss of R peripheral field, no driving    Past Surgical History:  Procedure Laterality Date  . ANKLE SURGERY  1990s   right foot with plate and screws  . CORONARY ARTERY BYPASS GRAFT N/A 02/24/2017   3v Procedure:  CORONARY ARTERY BYPASS GRAFTING (CABG) x 3 ON PUMP USING LEFT INTERNAL MAMMARY ARTERY TO LEFT ANTERIOR DESENDING CORNARY ARTERY, RIGHT GREATER SAPHENOUS VEIN TO LEFT CIRCUMFLEX ARTERY AND POSTERIOR DESENDING ARTERY. RIGHT GREATER SAPHENOUS VEIN OBTAINED VIA ENDOVEIN HARVEST.;  Surgeon: Delight Ovens, MD  . IABP INSERTION N/A 02/21/2017   Procedure: IABP Insertion;  Surgeon: Yvonne Kendall, MD;  Location: [facility];  Service: Cardiovascular;  Laterality: N/A;  . LEFT HEART CATH AND CORONARY ANGIOGRAPHY N/A 02/21/2017   Procedure: LEFT HEART CATH AND CORONARY ANGIOGRAPHY;  Surgeon: Yvonne Kendall, MD;  Location: [facility];  Service: Cardiovascular;  Laterality: N/A;  . TEE WITHOUT CARDIOVERSION N/A 02/24/2017   Procedure: TRANSESOPHAGEAL ECHOCARDIOGRAM (TEE);  Surgeon: Delight Ovens, MD;  Location: [facility];  Service: Open Heart Surgery;  Laterality: N/A;    No Known Allergies Medications Prior to Admission  Medication Sig Dispense Refill Last Dose  . aspirin EC 325 MG EC tablet Take 1 tablet (325 mg total) by mouth daily.   7/27?  Marland Kitchen atorvastatin (LIPITOR) 80 MG tablet TAKE 1 TABLET (80 MG TOTAL) BY MOUTH DAILY. FOR CHOLESTEROL (Patient taking differently: Take 80 mg by mouth daily. FOR CHOLESTEROL) 90 tablet 1 7/27? at am  . carvedilol (COREG) 3.125 MG tablet Take 1 tablet (3.125 mg total) by mouth 2 (two) times daily with a meal. 1810 tablet  1 08/09/2017 at before 6am  . ezetimibe (ZETIA) 10 MG tablet Take 1 tablet (10 mg total) by mouth daily. 90 tablet 3 7/27?  Marland Kitchen losartan (COZAAR) 25 MG tablet Take 1 tablet (25 mg total) by mouth daily. 90 tablet 1 7/27? at early am  . oxyCODONE (OXY IR/ROXICODONE) 5 MG immediate release tablet Take 1-2 tablets (5-10 mg total) by mouth every 6 (six) hours as needed for severe pain. (Patient not taking: Reported on 07/12/2017) 30 tablet 0 Not Taking at Unknown time  . phenytoin (DILANTIN) 100 MG ER capsule Take 100 mg by mouth 3 (three) times  daily.   month ago??   Family History  Problem Relation Age of Onset  . Alzheimer's disease Maternal Grandfather   . Cancer Mother        lymphoma  . Alcohol abuse Father        smoker  . Coronary artery disease Neg Hx   . Stroke Neg Hx   . Diabetes Neg Hx     Social History   Socioeconomic History  . Marital status: Single    Spouse name: Not on file  . Number of children: Not on file  . Years of education: Not on file  . Highest education level: Not on file  Occupational History  . Not on file  Social Needs  . Financial resource strain: Not on file  . Food insecurity:    Worry: Not on file    Inability: Not on file  . Transportation needs:    Medical: Not on file    Non-medical: Not on file  Tobacco Use  . Smoking status: Never Smoker  . Smokeless tobacco: Former Engineer, water and Sexual Activity  . Alcohol use: Yes    Alcohol/week: 8.4 oz    Types: 12 Cans of beer, 2 Shots of liquor per week    Comment: regular alcohol use  . Drug use: No  . Sexual activity: Never  Lifestyle  . Physical activity:    Days per week: Not on file    Minutes per session: Not on file  . Stress: Not on file  Relationships  . Social connections:    Talks on phone: Not on file    Gets together: Not on file    Attends religious service: Not on file    Active member of club or organization: Not on file    Attends meetings of clubs or organizations: Not on file    Relationship status: Not on file  . Intimate partner violence:    Fear of current or ex partner: Not on file    Emotionally abused: Not on file    Physically abused: Not on file    Forced sexual activity: Not on file  Other Topics Concern  . Not on file  Social History Narrative   Caffeine: 1 pot coffee   Lives with oldest sister in [place]   Occupation: disabled, was Corporate investment banker   Does not drive.   Activity: no regular exercise   Diet: good water, drinks V8 juice     ROS:  Unable to obtain.   Physical  Exam: Blood pressure 106/70, pulse 67, temperature 99.9 F (37.7 C), resp. rate 18, height 5\' 2"  (1.575 m), weight 169 lb 12.1 oz (77 kg), SpO2 100 %.  GENERAL: Intubated and sedated.  HEENT:  Pupils equal round and reactive, fundi not visualized, oral mucosa unremarkable NECK:  No jugular venous distention, waveform within normal limits, carotid upstroke brisk and  symmetric, no bruits, no thyromegaly LYMPHATICS:  No cervical, inguinal adenopathy LUNGS:  Decreased breath sounds BACK:  No CVA tenderness CHEST:  Well healed sternotomy scar. HEART:  PMI not displaced or sustained,S1 and S2 within normal limits, no S3, no S4, no clicks, no rubs, no murmurs ABD:  Flat, decreased bowel sounds, no bruits, no rebound, no guarding, no midline pulsatile mass, no hepatomegaly, no splenomegaly EXT:  2 plus pulses throughout, mild diffuse edema, no cyanosis no clubbing SKIN:  No rashes no nodules NEURO:  Intubated and sedated PSYCH:  NA  Labs: Lab Results  Component Value Date   BUN <5 (L) 08/11/2017   Lab Results  Component Value Date   CREATININE 0.69 08/11/2017   Lab Results  Component Value Date   NA 142 08/11/2017   K 3.3 (L) 08/11/2017   CL 110 08/11/2017   CO2 25 08/11/2017   Lab Results  Component Value Date   TROPONINI 0.15 (HH) 08/10/2017   Lab Results  Component Value Date   WBC 14.8 (H) 08/10/2017   HGB 11.0 (L) 08/10/2017   HCT 33.1 (L) 08/10/2017   MCV 95.1 08/10/2017   PLT 259 08/10/2017   Lab Results  Component Value Date   CHOL 177 07/11/2017   HDL 96 07/11/2017   LDLCALC 65 07/11/2017   LDLDIRECT 122.0 07/22/2016   TRIG 50 08/09/2017   CHOLHDL 1.8 07/11/2017   Lab Results  Component Value Date   ALT 41 08/11/2017   AST 51 (H) 08/11/2017   ALKPHOS 58 08/11/2017   BILITOT 0.7 08/11/2017    Echo:  Study Conclusions  - Left ventricle: The cavity size was normal. There was mild focal   basal hypertrophy of the septum. Systolic function was mildly    reduced. The estimated ejection fraction was in the range of 45%   to 50%. Mild diffuse hypokinesis. The study is not technically   sufficient to allow evaluation of LV diastolic function. - Aortic valve: Moderately to severely calcified annulus.   Trileaflet; normal thickness, moderately calcified leaflets.   There was mild regurgitation. - Pulmonary arteries: PA peak pressure: 32 mm Hg (S). - Inferior vena cava: The vessel was mildly dilated.    Radiology:   CXR: Cardiac shadow is stable. Right jugular central line, endotracheal tube and nasogastric catheter are again seen and stable. The lungs are well aerated bilaterally. No focal infiltrate or sizable effusion is seen.  EKG:NSR, anteroseptal Q waves, diffuse deep T wave inversion with significant QTC prolongation.  08/11/2017   ASSESSMENT AND PLAN:   CARDIAC ARREST:    He has a known seizure disorder with subtherapeutic meds and is currently in status. The etiology of the initial event in the grocery store appears to be related to this.  He did, however, have an arrhythmic event after presentation. . The EKG today (compared with yesterday) is dramatically different.  This can be seen with profound neurologic events.   Echo is unchanged from earlier this year.  Troponin is mildly elevated but non diagnostic for ACS.  There is on other evidence of an acute coronary event or primary arrhythmia as the presenting event.  Amiodarone has been discontinued and I agree.  Otherwise supportive care.   CAD:  I do not suspect an acute coronary event.  Restart ASA via tube.   CARDIOMYOPATHY:  Hypotensive.  Wean Levo as tolerated.    SignedRollene Rotunda: Gurjit Loconte 08/11/2017, 3:11 PM

## 2017-08-12 ENCOUNTER — Encounter (HOSPITAL_COMMUNITY): Payer: Self-pay

## 2017-08-12 ENCOUNTER — Telehealth: Payer: Self-pay | Admitting: Cardiology

## 2017-08-12 ENCOUNTER — Inpatient Hospital Stay (HOSPITAL_COMMUNITY): Payer: Medicare Other

## 2017-08-12 DIAGNOSIS — J9601 Acute respiratory failure with hypoxia: Secondary | ICD-10-CM

## 2017-08-12 DIAGNOSIS — Z9911 Dependence on respirator [ventilator] status: Secondary | ICD-10-CM

## 2017-08-12 LAB — BASIC METABOLIC PANEL
Anion gap: 8 (ref 5–15)
BUN: 5 mg/dL — AB (ref 8–23)
CALCIUM: 7.7 mg/dL — AB (ref 8.9–10.3)
CO2: 21 mmol/L — ABNORMAL LOW (ref 22–32)
CREATININE: 0.65 mg/dL (ref 0.61–1.24)
Chloride: 112 mmol/L — ABNORMAL HIGH (ref 98–111)
GFR calc Af Amer: >60 mL/min (ref >60)
GFR calc non Af Amer: >60 mL/min (ref >60)
Glucose, Bld: 113 mg/dL — ABNORMAL HIGH (ref 70–99)
Potassium: 3.2 mmol/L — ABNORMAL LOW (ref 3.5–5.1)
Sodium: 141 mmol/L (ref 135–145)

## 2017-08-12 LAB — GLUCOSE, CAPILLARY
GLUCOSE-CAPILLARY: 109 mg/dL — AB (ref 70–99)
GLUCOSE-CAPILLARY: 147 mg/dL — AB (ref 70–99)
GLUCOSE-CAPILLARY: 138 mg/dL — AB (ref 70–99)
GLUCOSE-CAPILLARY: 116 mg/dL — AB (ref 70–99)
Glucose-Capillary: 107 mg/dL — ABNORMAL HIGH (ref 70–99)
Glucose-Capillary: 125 mg/dL — ABNORMAL HIGH (ref 70–99)
Glucose-Capillary: 141 mg/dL — ABNORMAL HIGH (ref 70–99)
Glucose-Capillary: 92 mg/dL (ref 70–99)

## 2017-08-12 LAB — CBC
HCT: 30.6 % — ABNORMAL LOW (ref 39.0–52.0)
Hemoglobin: 9.9 g/dL — ABNORMAL LOW (ref 13.0–17.0)
MCH: 31.6 pg (ref 26.0–34.0)
MCHC: 32.4 g/dL (ref 30.0–36.0)
MCV: 97.8 fL (ref 78.0–100.0)
PLATELETS: 146 10*3/uL — AB (ref 150–400)
RBC: 3.13 MIL/uL — ABNORMAL LOW (ref 4.22–5.81)
RDW: 16.6 % — ABNORMAL HIGH (ref 11.5–15.5)
WBC: 7.8 10*3/uL (ref 4.0–10.5)

## 2017-08-12 LAB — POCT I-STAT 3, ART BLOOD GAS (G3+)
Acid-base deficit: 3 mmol/L — ABNORMAL HIGH (ref 0.0–2.0)
Bicarbonate: 22.3 mmol/L (ref 20.0–28.0)
O2 SAT: 96 %
PH ART: 7.365 (ref 7.350–7.450)
TCO2: 23 mmol/L (ref 22–32)
pCO2 arterial: 39.2 mmHg (ref 32.0–48.0)
pO2, Arterial: 89 mmHg (ref 83.0–108.0)

## 2017-08-12 LAB — PHENYTOIN LEVEL, TOTAL: Phenytoin Lvl: 13.8 ug/mL (ref 10.0–20.0)

## 2017-08-12 LAB — TRIGLYCERIDES: Triglycerides: 54 mg/dL (ref <150)

## 2017-08-12 LAB — TROPONIN I: Troponin I: 0.05 ng/mL (ref <0.03)

## 2017-08-12 MED ORDER — POTASSIUM CHLORIDE 20 MEQ/15ML (10%) PO SOLN
20.0000 meq | ORAL | Status: AC
  Administered 2017-08-12 (×2): 20 meq
  Filled 2017-08-12 (×2): qty 15

## 2017-08-12 MED ORDER — FUROSEMIDE 10 MG/ML IJ SOLN
40.0000 mg | Freq: Once | INTRAMUSCULAR | Status: AC
  Administered 2017-08-12: 40 mg via INTRAVENOUS
  Filled 2017-08-12: qty 4

## 2017-08-12 MED ORDER — POTASSIUM CHLORIDE 20 MEQ/15ML (10%) PO SOLN
40.0000 meq | Freq: Once | ORAL | Status: AC
  Administered 2017-08-12: 40 meq
  Filled 2017-08-12: qty 30

## 2017-08-12 MED ORDER — ACETAMINOPHEN 160 MG/5ML PO SOLN
650.0000 mg | Freq: Three times a day (TID) | ORAL | Status: DC | PRN
  Administered 2017-08-12 – 2017-08-16 (×6): 650 mg
  Filled 2017-08-12 (×7): qty 20.3

## 2017-08-12 MED ORDER — VITAL AF 1.2 CAL PO LIQD
1500.0000 mL | ORAL | Status: DC
  Administered 2017-08-12 – 2017-08-15 (×4): 1500 mL
  Filled 2017-08-12 (×6): qty 1500

## 2017-08-12 NOTE — Progress Notes (Signed)
Per Merry ProudBrandi, NP orders, went up tube feed to 20 ml/hr @ 0920. Paged and talked to dietician, new tube orders placed and rate of tube feeds currently at 60 ml/hr. Will continue to monitor.

## 2017-08-12 NOTE — Progress Notes (Signed)
[facility] FOR AM LAB REPLACEMENT ONLY  The [facility] does [facility] based on the criteria listed below:   1. Is GFR >/= 40 ml/min? Yes.    Patient's GFR today is >60 2. Is urine output >/= 0.5 ml/kg/hr for the last 6 hours? Yes.   Patient's UOP is 0.7 ml/kg/hr 3. Is BUN < 60 mg/dL? Yes.    Patient's BUN today is 5 4. Abnormal electrolyte(s): K 3.3 5. Ordered repletion with: protocol 6. If a panic level lab has been reported, has the CCM MD in charge been notified? No..   Physician:    Markus DaftWHELAN, Damascus Feldpausch A 08/12/2017 6:25 AM

## 2017-08-12 NOTE — Plan of Care (Signed)
Pt is unresponsive and hasn't respond to stimuli. Propofol off at noon per neurologist orders. Pt still on versed and weaning levo, currently at 6 mcg/min. Pt vital signs are stable. Mouth care done as ordered. PRN meds given as ordered. Family was at bedside, [facility] earlier regarding care. No new changes at this time. Will continue to monitor.

## 2017-08-12 NOTE — Progress Notes (Signed)
Propofol fully weaned off. Evaluated EEG earlier today after propofol discontinued, with diffuse slowing on EEG noted and no electrographic seizures. Patient also without clinical seizure activity at time of reassessment.  Electronically signed: Dr. Caryl PinaEric Jakalyn Kratky

## 2017-08-12 NOTE — Progress Notes (Addendum)
Subjective: No clinical seizure activity. Continues on 100 of propofol and 5 of Versed.   Objective: Current vital signs: BP (!) 81/56   Pulse 81   Temp 100 F (37.8 C)   Resp 19   Ht '5\' 2"'$  (1.575 m)   Wt 79 kg (174 lb 2.6 oz)   SpO2 99%   BMI 31.85 kg/m  Vital signs in last 24 hours: Temp:  [95.4 F (35.2 C)-100 F (37.8 C)] 100 F (37.8 C) (07/30 0700) Pulse Rate:  [59-81] 81 (07/30 0756) Resp:  [14-19] 19 (07/30 0756) BP: (81-123)/(56-83) 81/56 (07/30 0756) SpO2:  [97 %-100 %] 99 % (07/30 0756) Arterial Line BP: (86-140)/(44-79) 110/51 (07/30 0700) FiO2 (%):  [30 %-35 %] 30 % (07/30 0756) Weight:  [79 kg (174 lb 2.6 oz)] 79 kg (174 lb 2.6 oz) (07/30 0213)  Intake/Output from previous day: 07/29 0701 - 07/30 0700 In: 2197.2 [I.V.:1696.4; NG/GT:195.8; IV Piggyback:305] Out: 1155 [Urine:1155] Intake/Output this shift: No intake/output data recorded. Nutritional status:  Diet Order           Diet NPO time specified  Diet effective now          Neurologic Exam: Sedated on propofol at 100 mcg/kg/min and Versed at 5 mg/hr.  Ment: Unresponsive to external stimuli. No spontaneous movement (sedated).  CN: Pupils 3 mm and reactive. Minimal oculocephalic reflex.  Motor/Sensory: No movement to stimulation (sedated)   Lab Results: Results for orders placed or performed during the hospital encounter of 08/09/17 (from the past 48 hour(s))  Glucose, capillary     Status: Abnormal   Collection Time: 08/10/17  8:11 AM  Result Value Ref Range   Glucose-Capillary 172 (H) 70 - 99 mg/dL  Culture, blood (Routine X 2) w Reflex to ID Panel     Status: None (Preliminary result)   Collection Time: 08/10/17  8:31 AM  Result Value Ref Range   Specimen Description BLOOD RIGHT HAND    Special Requests      BOTTLES DRAWN AEROBIC AND ANAEROBIC Blood Culture adequate volume   Culture      NO GROWTH 1 DAY Performed at [facility], [place] [address] [place], [place] [address]     Report Status PENDING   Troponin I     Status: Abnormal   Collection Time: 08/10/17  9:20 AM  Result Value Ref Range   Troponin I 0.15 (HH) <0.03 ng/mL    Comment: CRITICAL VALUE NOTED.  VALUE IS CONSISTENT WITH PREVIOUSLY REPORTED AND CALLED VALUE. Performed at [facility], [place] [address] [place], [place] [address]   Glucose, capillary     Status: Abnormal   Collection Time: 08/10/17 12:00 PM  Result Value Ref Range   Glucose-Capillary 114 (H) 70 - 99 mg/dL   Comment 1 Notify RN   Basic metabolic panel     Status: Abnormal   Collection Time: 08/10/17 12:42 PM  Result Value Ref Range   Sodium 138 135 - 145 mmol/L   Potassium 3.5 3.5 - 5.1 mmol/L   Chloride 105 98 - 111 mmol/L   CO2 22 22 - 32 mmol/L   Glucose, Bld 131 (H) 70 - 99 mg/dL   BUN <5 (L) 8 - 23 mg/dL   Creatinine, Ser 0.66 0.61 - 1.24 mg/dL   Calcium 7.8 (L) 8.9 - 10.3 mg/dL   GFR calc non Af Amer >60 >60 mL/min   GFR calc Af Amer >60 >60 mL/min    Comment: (NOTE) The  eGFR has been calculated using the CKD EPI equation. This calculation has not been validated in all clinical situations. eGFR's persistently <60 mL/min signify possible Chronic Kidney Disease.    Anion gap 11 5 - 15    Comment: Performed at [facility] [address] [place], [place] [address]  Glucose, capillary     Status: Abnormal   Collection Time: 08/10/17  4:35 PM  Result Value Ref Range   Glucose-Capillary 127 (H) 70 - 99 mg/dL  Glucose, capillary     Status: Abnormal   Collection Time: 08/10/17  8:29 PM  Result Value Ref Range   Glucose-Capillary 104 (H) 70 - 99 mg/dL  Glucose, capillary     Status: Abnormal   Collection Time: 08/10/17 11:46 PM  Result Value Ref Range   Glucose-Capillary 111 (H) 70 - 99 mg/dL   Comment 1 Capillary Specimen    Comment 2 Notify RN   Glucose, capillary     Status: Abnormal   Collection Time: 08/11/17  4:06 AM  Result Value Ref Range   Glucose-Capillary 105 (H) 70 - 99 mg/dL   Comment 1  Capillary Specimen    Comment 2 Notify RN   Basic metabolic panel     Status: Abnormal   Collection Time: 08/11/17  4:18 AM  Result Value Ref Range   Sodium 142 135 - 145 mmol/L   Potassium 3.3 (L) 3.5 - 5.1 mmol/L   Chloride 110 98 - 111 mmol/L   CO2 25 22 - 32 mmol/L   Glucose, Bld 108 (H) 70 - 99 mg/dL   BUN <5 (L) 8 - 23 mg/dL   Creatinine, Ser 0.69 0.61 - 1.24 mg/dL   Calcium 7.5 (L) 8.9 - 10.3 mg/dL   GFR calc non Af Amer >60 >60 mL/min   GFR calc Af Amer >60 >60 mL/min    Comment: (NOTE) The eGFR has been calculated using the CKD EPI equation. This calculation has not been validated in all clinical situations. eGFR's persistently <60 mL/min signify possible Chronic Kidney Disease.    Anion gap 7 5 - 15    Comment: Performed at [facility] [address], [place], [place] [address]  Hepatic function panel     Status: Abnormal   Collection Time: 08/11/17  4:18 AM  Result Value Ref Range   Total Protein 4.8 (L) 6.5 - 8.1 g/dL   Albumin 2.6 (L) 3.5 - 5.0 g/dL   AST 51 (H) 15 - 41 U/L   ALT 41 0 - 44 U/L   Alkaline Phosphatase 58 38 - 126 U/L   Total Bilirubin 0.7 0.3 - 1.2 mg/dL   Bilirubin, Direct 0.3 (H) 0.0 - 0.2 mg/dL   Indirect Bilirubin 0.4 0.3 - 0.9 mg/dL    Comment: Performed at [facility] [address], [place], [place] [address]  Phenytoin level, total     Status: None   Collection Time: 08/11/17  7:08 AM  Result Value Ref Range   Phenytoin Lvl 12.7 10.0 - 20.0 ug/mL    Comment: Performed at [facility] [address] [place], [place] [address]  Glucose, capillary     Status: None   Collection Time: 08/11/17  8:23 AM  Result Value Ref Range   Glucose-Capillary 96 70 - 99 mg/dL   Comment 1 Capillary Specimen   Glucose, capillary     Status: Abnormal   Collection Time: 08/11/17 12:16 PM  Result Value Ref Range  Glucose-Capillary 102 (H) 70 - 99 mg/dL   Comment 1 Capillary Specimen   Glucose, capillary     Status: Abnormal    Collection Time: 08/11/17  7:51 PM  Result Value Ref Range   Glucose-Capillary 107 (H) 70 - 99 mg/dL   Comment 1 Arterial Specimen   Glucose, capillary     Status: None   Collection Time: 08/11/17 11:50 PM  Result Value Ref Range   Glucose-Capillary 92 70 - 99 mg/dL   Comment 1 Arterial Specimen   BMET in AM     Status: Abnormal   Collection Time: 08/12/17  4:11 AM  Result Value Ref Range   Sodium 141 135 - 145 mmol/L   Potassium 3.2 (L) 3.5 - 5.1 mmol/L   Chloride 112 (H) 98 - 111 mmol/L   CO2 21 (L) 22 - 32 mmol/L   Glucose, Bld 113 (H) 70 - 99 mg/dL   BUN 5 (L) 8 - 23 mg/dL   Creatinine, Ser 0.65 0.61 - 1.24 mg/dL   Calcium 7.7 (L) 8.9 - 10.3 mg/dL   GFR calc non Af Amer >60 >60 mL/min   GFR calc Af Amer >60 >60 mL/min    Comment: (NOTE) The eGFR has been calculated using the CKD EPI equation. This calculation has not been validated in all clinical situations. eGFR's persistently <60 mL/min signify possible Chronic Kidney Disease.    Anion gap 8 5 - 15    Comment: Performed at [facility] [address], [place], [place] [address]  CBC     Status: Abnormal   Collection Time: 08/12/17  4:11 AM  Result Value Ref Range   WBC 7.8 4.0 - 10.5 K/uL   RBC 3.13 (L) 4.22 - 5.81 MIL/uL   Hemoglobin 9.9 (L) 13.0 - 17.0 g/dL   HCT 30.6 (L) 39.0 - 52.0 %   MCV 97.8 78.0 - 100.0 fL   MCH 31.6 26.0 - 34.0 pg   MCHC 32.4 30.0 - 36.0 g/dL   RDW 16.6 (H) 11.5 - 15.5 %   Platelets 146 (L) 150 - 400 K/uL    Comment: Performed at [facility], [place] [address], [place], [place] [address]  Glucose, capillary     Status: Abnormal   Collection Time: 08/12/17  4:16 AM  Result Value Ref Range   Glucose-Capillary 109 (H) 70 - 99 mg/dL   Comment 1 Arterial Specimen   I-STAT 3, arterial blood gas (G3+)     Status: Abnormal   Collection Time: 08/12/17  6:31 AM  Result Value Ref Range   pH, Arterial 7.365 7.350 - 7.450   pCO2 arterial 39.2 32.0 - 48.0 mmHg   pO2, Arterial 89.0  83.0 - 108.0 mmHg   Bicarbonate 22.3 20.0 - 28.0 mmol/L   TCO2 23 22 - 32 mmol/L   O2 Saturation 96.0 %   Acid-base deficit 3.0 (H) 0.0 - 2.0 mmol/L   Patient temperature 99.9 F    Collection site ARTERIAL LINE    Drawn by Operator    Sample type ARTERIAL     Recent Results (from the past 240 hour(s))  MRSA PCR Screening     Status: None   Collection Time: 08/09/17  5:39 PM  Result Value Ref Range Status   MRSA by PCR NEGATIVE NEGATIVE Final    Comment:        The GeneXpert MRSA Assay (FDA approved for NASAL specimens only), is one component of a comprehensive MRSA colonization surveillance program.  It is not intended to diagnose MRSA infection nor to guide or monitor treatment for MRSA infections. Performed at [facility], [place] [address], [place], [place] [address]   Culture, blood (Routine X 2) w Reflex to ID Panel     Status: None (Preliminary result)   Collection Time: 08/09/17  6:04 PM  Result Value Ref Range Status   Specimen Description BLOOD SITE NOT SPECIFIED  Final   Special Requests   Final    BOTTLES DRAWN AEROBIC AND ANAEROBIC Blood Culture adequate volume   Culture   Final    NO GROWTH 2 DAYS Performed at [facility], [address] [place], [place] [address]    Report Status PENDING  Incomplete  Culture, blood (Routine X 2) w Reflex to ID Panel     Status: None (Preliminary result)   Collection Time: 08/10/17  8:31 AM  Result Value Ref Range Status   Specimen Description BLOOD RIGHT HAND  Final   Special Requests   Final    BOTTLES DRAWN AEROBIC AND ANAEROBIC Blood Culture adequate volume   Culture   Final    NO GROWTH 1 DAY Performed at [facility], [place] [address] [place], [place] [address]    Report Status PENDING  Incomplete    Lipid Panel Recent Labs    08/09/17 2255  TRIG 50    Studies/Results: Dg Chest Port 1 View  Result Date: 08/11/2017 CLINICAL DATA:  Inhibition EXAM: PORTABLE CHEST 1 VIEW COMPARISON:  Chest  x-rays dated 08/10/2017 and 08/09/2017. FINDINGS: Endotracheal tube is well positioned approximately 3 cm above the carina. Enteric tube passes below the diaphragm. RIGHT IJ central line is well positioned with tip at the level of the lower SVC/cavoatrial junction. Pacer pads overlie the lower heart. Lungs appear clear.  No pleural effusion or pneumothorax seen. IMPRESSION: 1. Support apparatus appears appropriately positioned. 2. No active disease.  Lungs appear clear. Electronically Signed   By: Franki Cabot M.D.   On: 08/11/2017 14:28   Dg Abd Portable 1v  Result Date: 08/11/2017 CLINICAL DATA:  Feeding tube placement. EXAM: PORTABLE ABDOMEN - 1 VIEW COMPARISON:  None. FINDINGS: Enteric tube is positioned in the stomach. Visualized bowel gas pattern is nonobstructive. No evidence of free intraperitoneal air seen. IMPRESSION: Feeding tube in the stomach with tip directed towards the gastric antrum. Electronically Signed   By: Franki Cabot M.D.   On: 08/11/2017 14:29    Medications:  Scheduled: . chlorhexidine gluconate (MEDLINE KIT)  15 mL Mouth Rinse BID  . Chlorhexidine Gluconate Cloth  6 each Topical Daily  . feeding supplement (PRO-STAT SUGAR FREE 64)  30 mL Per Tube 5 X Daily  . feeding supplement (VITAL HIGH PROTEIN)  1,000 mL Per Tube Q24H  . heparin injection (subcutaneous)  5,000 Units Subcutaneous Q8H  . insulin aspart  0-9 Units Subcutaneous Q4H  . mouth rinse  15 mL Mouth Rinse 10 times per day  . pantoprazole sodium  40 mg Per Tube Q1200  . phenytoin (DILANTIN) IV  100 mg Intravenous Q8H  . potassium chloride  20 mEq Per Tube Q4H  . sodium chloride flush  10-40 mL Intracatheter Q12H   Continuous: . lacosamide (VIMPAT) IV Stopped (08/11/17 2152)  . levETIRAcetam Stopped (08/12/17 0418)  . midazolam (VERSED) infusion 5 mg/hr (08/12/17 0650)  . norepinephrine (LEVOPHED) Adult infusion 7 mcg/min (08/12/17 0600)  . propofol (DIPRIVAN) infusion 100 mcg/kg/min (08/12/17 0728)     LTM EEG 08/10/2017  at 07 53 am to 08/11/2017 at 7:30 AM Day 2 : No clinical or subclinical seizures present throughout the recording.  Background activities marked by burst and suppression pattern with 4-6 ratio.  Within such bursts of cerebral activities left hemispheric sharp waves and spikes present with maximum negativity at left frontocentral and anterior frontal cortex.  Sometimes preceded by couple seconds of fast frequencies in that region. Clinical interpretation: This day 2 of intensive EEG monitoring with simultaneous  video monitoring did not demonstrate any clinical subclinical seizures.  Background activities marked by burst and suppression pattern suggestive of severe encephalopathy of nonspecific etiologies.  Sedation status may contribute to these findings.  Within these  burst of cerebral activities left hemispheric sharp waves and spikes present maximum negativity in the left anterior frontal and frontocentral cortex.  This finding suggestive of some degree of cortical irritability in that region.  LTM report for this morning: Pending   Assessment: Status epilepticus in the setting of seizure disorder following hemorrhagic stroke, EtOH abuse and recently having been taken off Dilantin.  1. Patient now in burst suppression on propofol at 100 mcg/kg/min and Versed at 5 mg/hr. 2. Covered with Dilantin at 100 mg q8h IV. Also on Keppra 1000 mg BID and Vimpat 200 mg BID. Will continue after he has been weaned off sedation.  3. EEG with stable burst suppression pattern.  4. LTM EEG report for today is pending.  5. Status post cardiac arrest. Per Cardiology, the initial event in the grocery store appears most likely to be related to seizure.   Recommendations: 1. Start weaning off propofol, with decrease of 20 mcg/kg/hr over 5 hours. Has been turned down to 80 from 100 as of 8 AM. Expect wean to be complete by noon. Will monitor LTM EEG for possible seizure recrudescence. 2. Will  continue Versed at 5 mg/hr for possible EtOH withdrawal. Will likely transition to scheduled Ativan after extubation and then taper over 3 days.  3. Continue Dilantin at 100 mg q8h. Level is pending.   4. Continue Keppra 1000 mg BID and Vimpat 200 mg BID.   35 minutes spent in the neurological evaluation and management of this critically ill patient. Time spent included EEG review.    LOS: 3 days   '@Electronically'$  signed: Dr. Kerney Elbe 08/12/2017  8:10 AM

## 2017-08-12 NOTE — Progress Notes (Signed)
LTM EEG checked, no skin breakdown seen under Fp2 or Fp1

## 2017-08-12 NOTE — Progress Notes (Addendum)
Per neurologist, weaning propofol every hour. Going down 20 mcg/kg/min, till propofol is off at 1200. Started wean at 0800. Will page MD at 1pm as directed. Pt current drip running at 6120mcg/kg/min. Will continue to monitor.

## 2017-08-12 NOTE — Progress Notes (Signed)
PULMONARY / CRITICAL CARE MEDICINE   Name: [name] Cruz MRN: [id number] DOB: 05-Feb-1955    ADMISSION DATE:  08/09/2017 CONSULTATION DATE:  08/09/17  REFERRING MD:  Dr. Fredderick Phenix / EDP   CHIEF COMPLAINT:  Seizure   BRIEF SUMMARY:   62 y/o M who presented to [facility] on 7/27 via EMS with reports of seizures.    Per patients son, he was in line at [employer] and then had garbled speech, spun around and fell to the ground with seizure like activity.  EMS reported the patient was post-ictal on arrival to scene.  PMH of large hemorrhagic CVA in 2007 with residual speech difficulties.  The patient was alert/oriented to self & place on arrival to ER.  While in the room, he was witnessed to have altered speech and couldn't tell the staff where he was etc. He then had a seizure and was treated with 1mg  IV ativan.  He relaxed after about 30 seconds.     He was sent for CT head and while in the scanner he had an episode of ventricular tachycardia with arrest.  CPR was initiated and he was returned to the ER.  By the time he arrived to the ER, he was in a narrow complex rhythm without a pulse.  He was not shocked.  CPR total time ~ 10 minutes. Pt treated with amiodarone, EPI x2 and intubated without drugs. During intubation, he seized another time. Initial labs-Na 132, K 3.9, CL 99, CO2 22, glucose 206, BUN 10 / sr cr 0.85, Mg 1.8, WBC 7.9, Hgb 11.2 and platelets 231.  CXR pending.  CT of the head 7/27 showed no acute abnormality, moderate chronic small vessel disease, stable chronic ventriculomegaly compatible with left temporal occipital encephalomalacia / central volume loss. PCCM called for ICU admission.  Of note, on medication review, the patient last filled his phenytoin on 03/27/17 (takes 100mg  TID) for a three month supply.  Raises question of running out of meds?? Family thinks he had been out for a "little while".  Admitted for hypothermia protocol but patient was purposeful on arrival to ICU, discontinued.   Deep sedation initiated per Neuro for burst suppression. Lifting sedation 7/30  SUBJECTIVE:  RN reports plan is to wean propofol off by noon.  Remains on propofol / versed.  No acute events overnight.   VITAL SIGNS: BP (!) 81/56   Pulse 81   Temp 100 F (37.8 C)   Resp 19   Ht 5\' 2"  (1.575 m)   Wt 174 lb 2.6 oz (79 kg)   SpO2 99%   BMI 31.85 kg/m   HEMODYNAMICS: CVP:  [8 mmHg] 8 mmHg  VENTILATOR SETTINGS: Vent Mode: PRVC FiO2 (%):  [30 %-35 %] 30 % Set Rate:  [18 bmp] 18 bmp Vt Set:  [440 mL-550 mL] 440 mL PEEP:  [5 cmH20] 5 cmH20 Plateau Pressure:  [15 cmH20-17 cmH20] 16 cmH20  INTAKE / OUTPUT: I/O last 3 completed shifts: In: 3957.3 [I.V.:3121.5; NG/GT:195.8; IV Piggyback:640] Out: 1880 [Urine:1880]  PHYSICAL EXAMINATION: General: adult male lying in bed in NAD HEENT: MM pink/moist, ETT, R IJ TLC clean/dry, EEG electrodes in place Neuro: sedate, pupils equal, 3mm & briskly reactive  CV: s1s2 rrr, no m/r/g PULM: even/non-labored, lungs bilaterally coarse, diminished bases  GL:OVFI, non-tender, bsx4 active  Extremities: warm/dry, 1+ generalized edema  Skin: no rashes or lesions  LABS:  BMET Recent Labs  Lab 08/10/17 1242 08/11/17 0418 08/12/17 0411  NA 138 142 141  K  3.5 3.3* 3.2*  CL 105 110 112*  CO2 22 25 21*  BUN <5* <5* 5*  CREATININE 0.66 0.69 0.65  GLUCOSE 131* 108* 113*    Electrolytes Recent Labs  Lab 08/09/17 1339  08/10/17 0315 08/10/17 1242 08/11/17 0418 08/12/17 0411  CALCIUM  --    < > 7.5* 7.8* 7.5* 7.7*  MG 1.8  --  1.8  --   --   --   PHOS  --   --  3.0  --   --   --    < > = values in this interval not displayed.    CBC Recent Labs  Lab 08/09/17 1312 08/10/17 0315 08/12/17 0411  WBC 7.9 14.8* 7.8  HGB 11.2* 11.0* 9.9*  HCT 34.9* 33.1* 30.6*  PLT 231 259 146*    Coag's Recent Labs  Lab 08/09/17 1339  APTT 28  INR 1.04    ABG Recent Labs  Lab 08/09/17 1734 08/10/17 0506 08/12/17 0631  PHART 7.225*  7.462* 7.365  PCO2ART 52.7* 29.1* 39.2  PO2ART 79.0* 204* 89.0    Cardiac Enzymes Recent Labs  Lab 08/09/17 2115 08/10/17 0315 08/10/17 0920  TROPONINI 0.28* 0.28* 0.15*    Glucose Recent Labs  Lab 08/11/17 0406 08/11/17 0823 08/11/17 1216 08/11/17 1951 08/11/17 2350 08/12/17 0416  GLUCAP 105* 96 102* 107* 92 109*    Imaging Dg Chest Port 1 View  Result Date: 08/11/2017 CLINICAL DATA:  Inhibition EXAM: PORTABLE CHEST 1 VIEW COMPARISON:  Chest x-rays dated 08/10/2017 and 08/09/2017. FINDINGS: Endotracheal tube is well positioned approximately 3 cm above the carina. Enteric tube passes below the diaphragm. RIGHT IJ central line is well positioned with tip at the level of the lower SVC/cavoatrial junction. Pacer pads overlie the lower heart. Lungs appear clear.  No pleural effusion or pneumothorax seen. IMPRESSION: 1. Support apparatus appears appropriately positioned. 2. No active disease.  Lungs appear clear. Electronically Signed   By: Bary Richard M.D.   On: 08/11/2017 14:28   Dg Abd Portable 1v  Result Date: 08/11/2017 CLINICAL DATA:  Feeding tube placement. EXAM: PORTABLE ABDOMEN - 1 VIEW COMPARISON:  None. FINDINGS: Enteric tube is positioned in the stomach. Visualized bowel gas pattern is nonobstructive. No evidence of free intraperitoneal air seen. IMPRESSION: Feeding tube in the stomach with tip directed towards the gastric antrum. Electronically Signed   By: Bary Richard M.D.   On: 08/11/2017 14:29    STUDIES:  CT Head 7/27 >> no acute abnormality, moderate chronic small vessel disease, stable chronic ventriculomegaly compatible with left temporal occipital encephalomalacia / central volume loss.  ECHO 7/27 >> LV normal size, mild focal basal hypertrophy of the septum, mildly reduced systolic function, LVEF 45-50%, AV moderately to severely calcified annulus, mild AR, PA peak 32 mmHg, IVC mildly dilated.  EEG 7/29 >> intermittent burst suppression EEG 7/30 >> burst  suppression   CULTURES: BCx2 7/27 >>  Trach aspirate 7/27 >>  UA 7/27 >> negative    SIGNIFICANT EVENTS: 7/27  Admit to [facility] with seizure, suffered VT arrest in CT.  Had some purposeful movement after arrival to ICU, cooling protocol discontinued  7/28 EKG with new t-wave inversion 7/29 Remains on propofol, versed for burst suppression.  Requiring levo due to sedation.  Amiodarone stopped, Cards consulted 7/30  QTc improved, burst suppression being lifted  LINES/TUBES: ETT 7/27 >> R IJ TLC 7/27 >>  L Radial ALine 7/27 >>   DISCUSSION: 62 y/o M admitted after seizure.  While  in CT, he suffered a cardiac arrest with suspected VT as initial rhythm.  Arrest thought to be ~ 10 minutes.  He was transferred back to the ER with CPR in progress from the CT scanner and on arrival had a narrow complex rhythm with no pulse. Intubated without drugs.  Seized during intubation.  Initially was to undergo hypothermia protocol but after arriving to ICU from ER, was thought to be purposeful and hypothermia protocol discontinued.  He has had continuous EEG with persistent seizures despite propofol/versed.  Suppression achieved 7/29.   ASSESSMENT / PLAN:  PULMONARY A: Acute Respiratory Insufficiency - in setting of seizure, AMS, cardiac arrest  Right Pleural Effusion - small, has been off home lasix P:   Continue full support, PRVC 8cc/kg  Wean PEEP / FiO2 for sats 88-95% Lasix as below Follow intermittent CXR  VAP prevention measures SBT / WUA when mental status permits PRN albuterol  CARDIOVASCULAR A:  Cardiac Arrest - occurred 7/27 while on CT scanner, initial rhythm thought to be VT, then PEA with narrow complex.  Troponin essentially flat.  Prolonged QTc, New T-wave inversion - felt related to neuro status, +/- amiodarone Hx CAD s/p CABG 03/2017, HTN, HLD P:  ICU monitoring  Hold home losartan, coreg, atorvastatin, zetia Wean levophed to off for MAP >65 Lasix 40 mg IVx1 with KCL Follow  QTc intermittently, improved 7/30   RENAL A:   Hyponatremia  Hypokalemia  P:   Trend BMP / urinary output Replace electrolytes as indicated, additional K with diuresis Check mg in am Avoid nephrotoxic agents, ensure adequate renal perfusion  GASTROINTESTINAL A:   At Risk Malnutrition  P:   NPO / OGT  TF per Nutrition  Prostat PPI for stress ulcer prophylaxis   HEMATOLOGIC A:   Mild Anemia  P:  Trend CBC  Heparin for DVT prophylaxis  SCD's to BLE   INFECTIOUS A:   No evidence infectious process on admit. R/O aspiration. P:   Follow cultures / monitor off abx  No evidence of acute infection   ENDOCRINE A:   Hyperglycemia - mild, suspect stress response    P:   SSI  NEUROLOGIC A:   Seizure - new on admit, ? If he had run out of phenytoin based on med review, family thinks he had been out "a little while".  Last filled for 3 mo supply on 03/27/17.   ? ETOH Use  P:   RASS goal: -3 to -4 Propofol in process of weaning to off by noon per Neuro  Continue versed gtt, weaning per Neuro Seizure precautions  Continue vimpat, keppra, dilantin   CC Time: 30 minutes  Canary BrimBrandi Kyian Obst, NP-C [facility] Pgr: [phone] or if no answer [phone] 08/12/2017, 8:42 AM

## 2017-08-12 NOTE — Progress Notes (Signed)
Progress Note  Patient Name: [name] Cruz Date of Encounter: 08/12/2017  Primary Cardiologist:   Larae Grooms, MD   Subjective   Intubated and sedated. Remains hypotensive. Low grade temp  Inpatient Medications    Scheduled Meds: . chlorhexidine gluconate (MEDLINE KIT)  15 mL Mouth Rinse BID  . Chlorhexidine Gluconate Cloth  6 each Topical Daily  . feeding supplement (PRO-STAT SUGAR FREE 64)  30 mL Per Tube 5 X Daily  . feeding supplement (VITAL HIGH PROTEIN)  1,000 mL Per Tube Q24H  . heparin injection (subcutaneous)  5,000 Units Subcutaneous Q8H  . insulin aspart  0-9 Units Subcutaneous Q4H  . mouth rinse  15 mL Mouth Rinse 10 times per day  . pantoprazole sodium  40 mg Per Tube Q1200  . phenytoin (DILANTIN) IV  100 mg Intravenous Q8H  . potassium chloride  20 mEq Per Tube Q4H  . sodium chloride flush  10-40 mL Intracatheter Q12H   Continuous Infusions: . lacosamide (VIMPAT) IV Stopped (08/11/17 2152)  . levETIRAcetam Stopped (08/12/17 0418)  . midazolam (VERSED) infusion 5 mg/hr (08/12/17 0650)  . norepinephrine (LEVOPHED) Adult infusion 7 mcg/min (08/12/17 0600)  . propofol (DIPRIVAN) infusion 100 mcg/kg/min (08/12/17 0728)   PRN Meds: albuterol, fentaNYL (SUBLIMAZE) injection, hydrALAZINE, sodium chloride flush   Vital Signs    Vitals:   08/12/17 0600 08/12/17 0630 08/12/17 0700 08/12/17 0756  BP: (!) 83/64  (!) 81/56 (!) 81/56  Pulse: 73  79 81  Resp: '18  18 19  '$ Temp: 99 F (37.2 C) 99.9 F (37.7 C) 100 F (37.8 C)   TempSrc:      SpO2: 99%  97% 99%  Weight:      Height:        Intake/Output Summary (Last 24 hours) at 08/12/2017 0803 Last data filed at 08/12/2017 0600 Gross per 24 hour  Intake 2092.08 ml  Output 1155 ml  Net 937.08 ml   Filed Weights   08/09/17 1302 08/09/17 1657 08/12/17 0213  Weight: 152 lb (68.9 kg) 169 lb 12.1 oz (77 kg) 174 lb 2.6 oz (79 kg)    Telemetry    NSR, rare ectopy - Personally Reviewed  ECG    NA -  Personally Reviewed  Physical Exam   GEN: No acute distress.   Neck: No  JVD Cardiac: RRR, no murmurs, rubs, or gallops.  Respiratory: Clear  to auscultation bilaterally. GI: Soft, nontender, non-distended , decreased bowel sounds.  MS: Mild diffuse  edema; No deformity. Neuro:   Intubated and sedated. Psych:    Unable to assess.    Labs    Chemistry Recent Labs  Lab 08/10/17 1242 08/11/17 0418 08/12/17 0411  NA 138 142 141  K 3.5 3.3* 3.2*  CL 105 110 112*  CO2 22 25 21*  GLUCOSE 131* 108* 113*  BUN <5* <5* 5*  CREATININE 0.66 0.69 0.65  CALCIUM 7.8* 7.5* 7.7*  PROT  --  4.8*  --   ALBUMIN  --  2.6*  --   AST  --  51*  --   ALT  --  41  --   ALKPHOS  --  58  --   BILITOT  --  0.7  --   GFRNONAA >60 >60 >60  GFRAA >60 >60 >60  ANIONGAP '11 7 8     '$ Hematology Recent Labs  Lab 08/09/17 1312 08/10/17 0315 08/12/17 0411  WBC 7.9 14.8* 7.8  RBC 3.61* 3.48* 3.13*  HGB 11.2* 11.0* 9.9*  HCT 34.9* 33.1* 30.6*  MCV 96.7 95.1 97.8  MCH 31.0 31.6 31.6  MCHC 32.1 33.2 32.4  RDW 15.5 15.6* 16.6*  PLT 231 259 146*    Cardiac Enzymes Recent Labs  Lab 08/09/17 1739 08/09/17 2115 08/10/17 0315 08/10/17 0920  TROPONINI 0.14* 0.28* 0.28* 0.15*   No results for input(s): TROPIPOC in the last 168 hours.   BNPNo results for input(s): BNP, PROBNP in the last 168 hours.   DDimer No results for input(s): DDIMER in the last 168 hours.   Radiology    Dg Chest Port 1 View  Result Date: 08/11/2017 CLINICAL DATA:  Inhibition EXAM: PORTABLE CHEST 1 VIEW COMPARISON:  Chest x-rays dated 08/10/2017 and 08/09/2017. FINDINGS: Endotracheal tube is well positioned approximately 3 cm above the carina. Enteric tube passes below the diaphragm. RIGHT IJ central line is well positioned with tip at the level of the lower SVC/cavoatrial junction. Pacer pads overlie the lower heart. Lungs appear clear.  No pleural effusion or pneumothorax seen. IMPRESSION: 1. Support apparatus appears  appropriately positioned. 2. No active disease.  Lungs appear clear. Electronically Signed   By: Franki Cabot M.D.   On: 08/11/2017 14:28   Dg Abd Portable 1v  Result Date: 08/11/2017 CLINICAL DATA:  Feeding tube placement. EXAM: PORTABLE ABDOMEN - 1 VIEW COMPARISON:  None. FINDINGS: Enteric tube is positioned in the stomach. Visualized bowel gas pattern is nonobstructive. No evidence of free intraperitoneal air seen. IMPRESSION: Feeding tube in the stomach with tip directed towards the gastric antrum. Electronically Signed   By: Franki Cabot M.D.   On: 08/11/2017 14:29    Cardiac Studies   Echo:    Study Conclusions  - Left ventricle: The cavity size was normal. There was mild focal   basal hypertrophy of the septum. Systolic function was mildly   reduced. The estimated ejection fraction was in the range of 45%   to 50%. Mild diffuse hypokinesis. The study is not technically   sufficient to allow evaluation of LV diastolic function. - Aortic valve: Moderately to severely calcified annulus.   Trileaflet; normal thickness, moderately calcified leaflets.   There was mild regurgitation. - Pulmonary arteries: PA peak pressure: 32 mm Hg (S). - Inferior vena cava: The vessel was mildly dilated.   Patient Profile     62 y.o. male with history ofhemorrhagic stroke (thought to be secondary to uncontrolled hypertension now with residual cognitive impairment), hypertension, hyperlipidemia, CAD and atrial fib.  He had atrial fib in Feb of this year.  He had an NSTEMI and was found to have 3 vessel CAD.  He had CABG.  He had a mildy reduced EF of 45% at that time.   He was admitted now with altered mentation and a seizure.  We were called after he had ventricular tachycardia and required CPR in the CT scanner.  He has a markedly abnormal EKG.    Assessment & Plan    Ventricular tachycardia:  No further arrhythmic events.  Follow.    Abnormal EKG:  Deep T wave inversion noted on EKG.     Troponin had been trending down and EF unchanged from previous.  Repeat trop and EKG this morning.    For questions or updates, please contact [facility] Please consult [email or web address] for contact info under Cardiology/STEMI.   Signed, Minus Breeding, MD  08/12/2017, 8:03 AM

## 2017-08-12 NOTE — Progress Notes (Signed)
Nutrition Follow-up  DOCUMENTATION CODES:   Obesity unspecified  INTERVENTION:   Tube Feeding:  Vital AF 1.2 @ 60 ml/hr Provides 1728 kcals, 108 g of protein and 1166 mL of free water   NUTRITION DIAGNOSIS:   Inadequate oral intake related to inability to eat as evidenced by NPO status.  Being Addressed via TF   GOAL:   Patient will meet greater than or equal to 90% of their needs   Progressing  MONITOR:   Diet advancement, Vent status, Labs, Weight trends, TF tolerance, I & O's  REASON FOR ASSESSMENT:   Ventilator    ASSESSMENT:   Patient with PMH significant for CVA with residual speech difficulties, HTN, HLD, and MI. Presents this admission with altered mental status and seizure-like activity. Pt was sent to CT head and while in the scanner had an episode of ventricular tachycardia with arrest.  CPR was initiated (10 minutes) and pt was intubated.   Patient is currently intubated on ventilator support MV: 8.1 L/min Temp (24hrs), Avg:98.3 F (36.8 C), Min:96.4 F (35.8 C), Max:100.6 F (38.1 C)  Propofol: weaning to off by noon today  Vital High Protein @ 10 ml/hr, Pro-Stat 30 mL 5 times daily via OG tube  Weight trending up; net +2 L. Utilizing EDW 77 kg at present  Labs: potassium 3.2 Meds: levophed, fentanyl, versed  Diet Order:   Diet Order           Diet NPO time specified  Diet effective now          EDUCATION NEEDS:   Not appropriate for education at this time  Skin:  Skin Assessment: Reviewed RN Assessment  Last BM:  no documented BM  Height:   Ht Readings from Last 1 Encounters:  08/12/17 5\' 2"  (1.575 m)    Weight:   Wt Readings from Last 1 Encounters:  08/12/17 174 lb 2.6 oz (79 kg)    Ideal Body Weight:  53.6 kg  BMI:  Body mass index is 31.85 kg/m.  Estimated Nutritional Needs:   Kcal:  1797 kcals   Protein:  95-150 g   Fluid:  >/= 1.7 L   Romelle Starcherate Barbarita Hutmacher MS, RD, LDN, CNSC [phone] Pager  [phone]  Weekend/On-Call Pager

## 2017-08-12 NOTE — Procedures (Signed)
Electroencephalography report.  Long-term monitoring  Number study day 3  CPT 95951  Recording begins 08/11/2017 at 07 30 am  Recording and 7/30 /2019 at 7:30 AM  Data acquisition: [employer] 10-20 for elective placement.  18 channels EEG with EKG channel.   This EEG was requested for this patient who presents with convulsions history of intracranial hemorrhage in the past.  Status post cardiac arrest.  Day 1 There was no pushbutton activation events during this recording.  Background activities were abnormal due to background activity slowing with superimposed near continues independent spikes in the left anterior temporal/frontal cortex maximum negativity at F7 and Fp1.  Spikes often occur in periodic rhythmic trains but without clear evolution.  In addition there is a independent right anterior temporal spikes maximum negativity at F7 present continuously throughout the recording at times was extended negative field and periodic fashion reminiscent of right hemispheric periodic lateralized epileptiform discharges.  In addition there is several electrographic seizures arising from right temporal region without obvious clinical accompaniment present.  At least not obvious on video monitoring.  There is a persistent faster frequencies present at midline and slight extension to the left paracentral region.  Could represent breach rhythm in that region in appropriate clinical setting.  During second half of the recording background activities evolve into more burst and attenuation in burst and suppression pattern with significant resolution of left hemispheric spikes.  Right temporal spikes still present however.  More blunted morphology and less prominent negative field.   Day 2 : No clinical or subclinical seizures present throughout the recording.  Background activities marked by burst and suppression pattern with 4-6 ratio.  Within such bursts of cerebral activities left hemispheric sharp  waves and spikes present with maximum negativity at left frontocentral and anterior frontal cortex.  Sometimes preceded by couple seconds of fast frequencies in that region.  Day 3; clinical or subclinical seizures present throughout the recording.  Background activities marked by burst and suppression pattern with largely 4-4 ratio.  Left paracentral and anterior frontal sharp waves present.  Clinical interpretation: This day 3 of intensive EEG monitoring with simultaneous  video monitoring did not demonstrate any clinical or subclinical seizures.  Background activities marked by burst and suppression pattern suggestive of severe encephalopathy of nonspecific etiologies.  Sedation status may contribute to these findings.  Left anterior frontal and frontal central sharp waves present throughout the recording suggestive of some degree of cortical irritability in that region.

## 2017-08-12 NOTE — Telephone Encounter (Signed)
Error

## 2017-08-13 ENCOUNTER — Inpatient Hospital Stay (HOSPITAL_COMMUNITY): Payer: Medicare Other

## 2017-08-13 DIAGNOSIS — G40901 Epilepsy, unspecified, not intractable, with status epilepticus: Secondary | ICD-10-CM

## 2017-08-13 LAB — GLUCOSE, CAPILLARY
GLUCOSE-CAPILLARY: 104 mg/dL — AB (ref 70–99)
GLUCOSE-CAPILLARY: 103 mg/dL — AB (ref 70–99)
Glucose-Capillary: 136 mg/dL — ABNORMAL HIGH (ref 70–99)
Glucose-Capillary: 123 mg/dL — ABNORMAL HIGH (ref 70–99)
Glucose-Capillary: 120 mg/dL — ABNORMAL HIGH (ref 70–99)

## 2017-08-13 LAB — BASIC METABOLIC PANEL
Anion gap: 11 (ref 5–15)
BUN: 10 mg/dL (ref 8–23)
CHLORIDE: 106 mmol/L (ref 98–111)
CO2: 26 mmol/L (ref 22–32)
CREATININE: 0.55 mg/dL — AB (ref 0.61–1.24)
Calcium: 7.7 mg/dL — ABNORMAL LOW (ref 8.9–10.3)
GFR calc Af Amer: >60 mL/min (ref >60)
GFR calc non Af Amer: >60 mL/min (ref >60)
GLUCOSE: 141 mg/dL — AB (ref 70–99)
Potassium: 3.8 mmol/L (ref 3.5–5.1)
SODIUM: 143 mmol/L (ref 135–145)

## 2017-08-13 LAB — CBC
HCT: 30.7 % — ABNORMAL LOW (ref 39.0–52.0)
Hemoglobin: 9.8 g/dL — ABNORMAL LOW (ref 13.0–17.0)
MCH: 31.5 pg (ref 26.0–34.0)
MCHC: 31.9 g/dL (ref 30.0–36.0)
MCV: 98.7 fL (ref 78.0–100.0)
PLATELETS: 153 10*3/uL (ref 150–400)
RBC: 3.11 MIL/uL — ABNORMAL LOW (ref 4.22–5.81)
RDW: 16.4 % — AB (ref 11.5–15.5)
WBC: 8.3 10*3/uL (ref 4.0–10.5)

## 2017-08-13 MED ORDER — FUROSEMIDE 10 MG/ML IJ SOLN
40.0000 mg | Freq: Once | INTRAMUSCULAR | Status: AC
  Administered 2017-08-13: 40 mg via INTRAVENOUS
  Filled 2017-08-13: qty 4

## 2017-08-13 MED ORDER — MAGNESIUM SULFATE 2 GM/50ML IV SOLN
2.0000 g | Freq: Once | INTRAVENOUS | Status: AC
  Administered 2017-08-13: 2 g via INTRAVENOUS
  Filled 2017-08-13: qty 50

## 2017-08-13 MED ORDER — FUROSEMIDE 10 MG/ML IJ SOLN
20.0000 mg | Freq: Every day | INTRAMUSCULAR | Status: AC
  Administered 2017-08-14 – 2017-08-16 (×3): 20 mg via INTRAVENOUS
  Filled 2017-08-13 (×3): qty 2

## 2017-08-13 MED ORDER — LORAZEPAM 2 MG/ML IJ SOLN
2.0000 mg | Freq: Three times a day (TID) | INTRAMUSCULAR | Status: DC
  Administered 2017-08-13 – 2017-08-15 (×6): 2 mg via INTRAVENOUS
  Filled 2017-08-13 (×7): qty 1

## 2017-08-13 MED ORDER — LORAZEPAM 2 MG/ML IJ SOLN
2.0000 mg | INTRAMUSCULAR | Status: DC | PRN
  Administered 2017-08-14: 2 mg via INTRAVENOUS
  Filled 2017-08-13 (×2): qty 1

## 2017-08-13 MED ORDER — BISACODYL 5 MG PO TBEC
5.0000 mg | DELAYED_RELEASE_TABLET | Freq: Every day | ORAL | Status: DC | PRN
  Administered 2017-08-13: 5 mg via ORAL
  Filled 2017-08-13: qty 1

## 2017-08-13 MED ORDER — POTASSIUM CHLORIDE 20 MEQ/15ML (10%) PO SOLN
40.0000 meq | Freq: Once | ORAL | Status: AC
  Administered 2017-08-13: 40 meq
  Filled 2017-08-13: qty 30

## 2017-08-13 MED ORDER — POTASSIUM CHLORIDE 20 MEQ/15ML (10%) PO SOLN
20.0000 meq | Freq: Once | ORAL | Status: AC
  Administered 2017-08-13: 20 meq
  Filled 2017-08-13: qty 15

## 2017-08-13 NOTE — Progress Notes (Signed)
Subjective: Off propofol. Versed continues at 5 mg/hr.   Objective: Current vital signs: BP 93/66   Pulse 95   Temp 99.7 F (37.6 C)   Resp (!) 25   Ht _0  (1.575 m)   Wt 77 kg (169 lb 12.1 oz)   SpO2 99%   BMI 31.05 kg/m  Vital signs in last 24 hours: Temp:  [99.3 F (37.4 C)-100.8 F (38.2 C)] 99.7 F (37.6 C) (07/31 0900) Pulse Rate:  [28-97] 95 (07/31 0900) Resp:  [15-25] 25 (07/31 0900) BP: (89-125)/(50-83) 93/66 (07/31 0900) SpO2:  [81 %-100 %] 99 % (07/31 0900) Arterial Line BP: (89-163)/(34-123) 127/59 (07/31 0900) FiO2 (%):  [30 %] 30 % (07/31 0755) Weight:  [77 kg (169 lb 12.1 oz)] 77 kg (169 lb 12.1 oz) (07/31 0500)  Intake/Output from previous day: 07/30 0701 - 07/31 0700 In: 2186.9 [I.V.:427; NG/GT:1470; IV Piggyback:290] Out: 2290 [Urine:2290] Intake/Output this shift: No intake/output data recorded. Nutritional status:  Diet Order           Diet NPO time specified  Diet effective now          Neurologic Exam:Sedated on Versed at 5 mg/hr.  Ment: Eyes open spontaneously. Unresponsive to external stimuli. No spontaneous limb movement (sedated).  CN: Pupils 3 mm and equally reactive. Minimal oculocephalic reflex. Blinks intermittently, but not to threat. Face flaccidly symmetric.  Motor/Sensory: Weak flexion of LUE and LLE to noxious stimuli. Trace flexion of RLE to plantar stimulation. No movement of RUE to any stimulus. Tone decreased x 4.  Reflexes: 2+ bilateral brachioradialis, biceps and patellae. Toes mute bilaterally.     Lab Results: Results for orders placed or performed during the hospital encounter of 08/09/17 (from the past 48 hour(s))  Glucose, capillary     Status: Abnormal   Collection Time: 08/11/17 12:16 PM  Result Value Ref Range   Glucose-Capillary 102 (H) 70 - 99 mg/dL   Comment 1 Capillary Specimen   Glucose, capillary     Status: Abnormal   Collection Time: 08/11/17  4:27 PM  Result Value Ref Range   Glucose-Capillary 107  (H) 70 - 99 mg/dL   Comment 1 Arterial Specimen   Glucose, capillary     Status: Abnormal   Collection Time: 08/11/17  7:51 PM  Result Value Ref Range   Glucose-Capillary 107 (H) 70 - 99 mg/dL   Comment 1 Arterial Specimen   Glucose, capillary     Status: None   Collection Time: 08/11/17 11:50 PM  Result Value Ref Range   Glucose-Capillary 92 70 - 99 mg/dL   Comment 1 Arterial Specimen   BMET in AM     Status: Abnormal   Collection Time: 08/12/17  4:11 AM  Result Value Ref Range   Sodium 141 135 - 145 mmol/L   Potassium 3.2 (L) 3.5 - 5.1 mmol/L   Chloride 112 (H) 98 - 111 mmol/L   CO2 21 (L) 22 - 32 mmol/L   Glucose, Bld 113 (H) 70 - 99 mg/dL   BUN 5 (L) 8 - 23 mg/dL   Creatinine, Ser 0.65 0.61 - 1.24 mg/dL   Calcium 7.7 (L) 8.9 - 10.3 mg/dL   GFR calc non Af Amer >60 >60 mL/min   GFR calc Af Amer >60 >60 mL/min    Comment: (NOTE) The eGFR has been calculated using the CKD EPI equation. This calculation has not been validated in all clinical situations. eGFR's persistently <60 mL/min signify possible Chronic Kidney Disease.  Anion gap 8 5 - 15    Comment: Performed at [facility] [address], [place], [place] [address]  CBC     Status: Abnormal   Collection Time: 08/12/17  4:11 AM  Result Value Ref Range   WBC 7.8 4.0 - 10.5 K/uL   RBC 3.13 (L) 4.22 - 5.81 MIL/uL   Hemoglobin 9.9 (L) 13.0 - 17.0 g/dL   HCT 30.6 (L) 39.0 - 52.0 %   MCV 97.8 78.0 - 100.0 fL   MCH 31.6 26.0 - 34.0 pg   MCHC 32.4 30.0 - 36.0 g/dL   RDW 16.6 (H) 11.5 - 15.5 %   Platelets 146 (L) 150 - 400 K/uL    Comment: Performed at [facility], [place] [address], [place], [place] [address]  Glucose, capillary     Status: Abnormal   Collection Time: 08/12/17  4:16 AM  Result Value Ref Range   Glucose-Capillary 109 (H) 70 - 99 mg/dL   Comment 1 Arterial Specimen   I-STAT 3, arterial blood gas (G3+)     Status: Abnormal   Collection Time: 08/12/17  6:31 AM  Result Value Ref Range    pH, Arterial 7.365 7.350 - 7.450   pCO2 arterial 39.2 32.0 - 48.0 mmHg   pO2, Arterial 89.0 83.0 - 108.0 mmHg   Bicarbonate 22.3 20.0 - 28.0 mmol/L   TCO2 23 22 - 32 mmol/L   O2 Saturation 96.0 %   Acid-base deficit 3.0 (H) 0.0 - 2.0 mmol/L   Patient temperature 99.9 F    Collection site ARTERIAL LINE    Drawn by Operator    Sample type ARTERIAL   Phenytoin level, total     Status: None   Collection Time: 08/12/17  8:13 AM  Result Value Ref Range   Phenytoin Lvl 13.8 10.0 - 20.0 ug/mL    Comment: Performed at [facility], [address], [place],  [address]  Troponin I     Status: Abnormal   Collection Time: 08/12/17  8:13 AM  Result Value Ref Range   Troponin I 0.05 (HH) <0.03 ng/mL    Comment: CRITICAL VALUE NOTED.  VALUE IS CONSISTENT WITH PREVIOUSLY REPORTED AND CALLED VALUE. Performed at [facility], [place] [address] [place], [place] [address]   Glucose, capillary     Status: Abnormal   Collection Time: 08/12/17  8:54 AM  Result Value Ref Range   Glucose-Capillary 125 (H) 70 - 99 mg/dL   Comment 1 Arterial Specimen   Glucose, capillary     Status: Abnormal   Collection Time: 08/12/17 12:08 PM  Result Value Ref Range   Glucose-Capillary 147 (H) 70 - 99 mg/dL   Comment 1 Arterial Specimen   Glucose, capillary     Status: Abnormal   Collection Time: 08/12/17  4:17 PM  Result Value Ref Range   Glucose-Capillary 138 (H) 70 - 99 mg/dL   Comment 1 Arterial Specimen   Glucose, capillary     Status: Abnormal   Collection Time: 08/12/17  8:17 PM  Result Value Ref Range   Glucose-Capillary 116 (H) 70 - 99 mg/dL   Comment 1 Notify RN   Triglycerides     Status: None   Collection Time: 08/12/17 10:25 PM  Result Value Ref Range   Triglycerides 54 <150 mg/dL    Comment: Performed at [facility], [place] [address], [place], [place] [address]  Glucose, capillary     Status: Abnormal  Collection Time: 08/12/17 11:13 PM  Result Value Ref Range    Glucose-Capillary 141 (H) 70 - 99 mg/dL   Comment 1 Notify RN   Glucose, capillary     Status: Abnormal   Collection Time: 08/13/17  3:37 AM  Result Value Ref Range   Glucose-Capillary 136 (H) 70 - 99 mg/dL   Comment 1 Notify RN   BMET in AM     Status: Abnormal   Collection Time: 08/13/17  3:39 AM  Result Value Ref Range   Sodium 143 135 - 145 mmol/L   Potassium 3.8 3.5 - 5.1 mmol/L   Chloride 106 98 - 111 mmol/L   CO2 26 22 - 32 mmol/L   Glucose, Bld 141 (H) 70 - 99 mg/dL   BUN 10 8 - 23 mg/dL   Creatinine, Ser 0.55 (L) 0.61 - 1.24 mg/dL   Calcium 7.7 (L) 8.9 - 10.3 mg/dL   GFR calc non Af Amer >60 >60 mL/min   GFR calc Af Amer >60 >60 mL/min    Comment: (NOTE) The eGFR has been calculated using the CKD EPI equation. This calculation has not been validated in all clinical situations. eGFR's persistently <60 mL/min signify possible Chronic Kidney Disease.    Anion gap 11 5 - 15    Comment: Performed at [facility] [address], [place], [place] [address]  CBC     Status: Abnormal   Collection Time: 08/13/17  3:39 AM  Result Value Ref Range   WBC 8.3 4.0 - 10.5 K/uL   RBC 3.11 (L) 4.22 - 5.81 MIL/uL   Hemoglobin 9.8 (L) 13.0 - 17.0 g/dL   HCT 30.7 (L) 39.0 - 52.0 %   MCV 98.7 78.0 - 100.0 fL   MCH 31.5 26.0 - 34.0 pg   MCHC 31.9 30.0 - 36.0 g/dL   RDW 16.4 (H) 11.5 - 15.5 %   Platelets 153 150 - 400 K/uL    Comment: Performed at [facility], [place] [address], [place], [place] [address]  Glucose, capillary     Status: Abnormal   Collection Time: 08/13/17  7:56 AM  Result Value Ref Range   Glucose-Capillary 123 (H) 70 - 99 mg/dL   Comment 1 Notify RN     Recent Results (from the past 240 hour(s))  MRSA PCR Screening     Status: None   Collection Time: 08/09/17  5:39 PM  Result Value Ref Range Status   MRSA by PCR NEGATIVE NEGATIVE Final    Comment:        The GeneXpert MRSA Assay (FDA approved for NASAL specimens only), is one component of  a comprehensive MRSA colonization surveillance program. It is not intended to diagnose MRSA infection nor to guide or monitor treatment for MRSA infections. Performed at [facility], [place] [address], [place], [place] [address]   Culture, blood (Routine X 2) w Reflex to ID Panel     Status: None (Preliminary result)   Collection Time: 08/09/17  6:04 PM  Result Value Ref Range Status   Specimen Description BLOOD SITE NOT SPECIFIED  Final   Special Requests   Final    BOTTLES DRAWN AEROBIC AND ANAEROBIC Blood Culture adequate volume   Culture   Final    NO GROWTH 3 DAYS Performed at [facility], [address], [place], [place] [address]    Report Status PENDING  Incomplete  Culture, blood (Routine X 2) w Reflex to ID Panel  Status: None (Preliminary result)   Collection Time: 08/10/17  8:31 AM  Result Value Ref Range Status   Specimen Description BLOOD RIGHT HAND  Final   Special Requests   Final    BOTTLES DRAWN AEROBIC AND ANAEROBIC Blood Culture adequate volume   Culture   Final    NO GROWTH 2 DAYS Performed at [facility], [address] [place], [place] [address]    Report Status PENDING  Incomplete    Lipid Panel Recent Labs    08/12/17 2225  TRIG 54    Studies/Results: Dg Chest Port 1 View  Result Date: 08/13/2017 CLINICAL DATA:  Respiratory failure EXAM: PORTABLE CHEST 1 VIEW COMPARISON:  08/12/2017 FINDINGS: Endotracheal tube, NG tube and right central line remain in place, unchanged. Mild cardiomegaly. Diffuse airspace disease throughout the right lung. No confluent opacity on the left. Possible small right effusion. No acute bony abnormality. IMPRESSION: Small right effusion. Diffuse airspace disease throughout the right lung, slightly increased since prior study. Low lung volumes. Electronically Signed   By: Rolm Baptise M.D.   On: 08/13/2017 07:33   Dg Chest Port 1 View  Result Date: 08/12/2017 CLINICAL DATA:  ET tube, respiratory  failure, hypoxia EXAM: PORTABLE CHEST 1 VIEW COMPARISON:  08/11/2017 FINDINGS: Right central line, endotracheal tube, NG tube remain in place, unchanged. Continued layering right effusion with right base atelectasis or infiltrate. No confluent opacity on the left. Mild cardiomegaly. Prior CABG. IMPRESSION: Continue small right effusion and right lower lobe atelectasis or infiltrate. No pneumothorax. No change. Electronically Signed   By: Rolm Baptise M.D.   On: 08/12/2017 09:14   Dg Chest Port 1 View  Result Date: 08/11/2017 CLINICAL DATA:  Inhibition EXAM: PORTABLE CHEST 1 VIEW COMPARISON:  Chest x-rays dated 08/10/2017 and 08/09/2017. FINDINGS: Endotracheal tube is well positioned approximately 3 cm above the carina. Enteric tube passes below the diaphragm. RIGHT IJ central line is well positioned with tip at the level of the lower SVC/cavoatrial junction. Pacer pads overlie the lower heart. Lungs appear clear.  No pleural effusion or pneumothorax seen. IMPRESSION: 1. Support apparatus appears appropriately positioned. 2. No active disease.  Lungs appear clear. Electronically Signed   By: Franki Cabot M.D.   On: 08/11/2017 14:28   Dg Abd Portable 1v  Result Date: 08/11/2017 CLINICAL DATA:  Feeding tube placement. EXAM: PORTABLE ABDOMEN - 1 VIEW COMPARISON:  None. FINDINGS: Enteric tube is positioned in the stomach. Visualized bowel gas pattern is nonobstructive. No evidence of free intraperitoneal air seen. IMPRESSION: Feeding tube in the stomach with tip directed towards the gastric antrum. Electronically Signed   By: Franki Cabot M.D.   On: 08/11/2017 14:29    Medications:  Scheduled: . chlorhexidine gluconate (MEDLINE KIT)  15 mL Mouth Rinse BID  . Chlorhexidine Gluconate Cloth  6 each Topical Daily  . heparin injection (subcutaneous)  5,000 Units Subcutaneous Q8H  . insulin aspart  0-9 Units Subcutaneous Q4H  . mouth rinse  15 mL Mouth Rinse 10 times per day  . pantoprazole sodium  40 mg  Per Tube Q1200  . phenytoin (DILANTIN) IV  100 mg Intravenous Q8H  . sodium chloride flush  10-40 mL Intracatheter Q12H   Continuous: . feeding supplement (VITAL AF 1.2 CAL) 60 mL/hr at 08/13/17 0600  . lacosamide (VIMPAT) IV 200 mg (08/13/17 0925)  . levETIRAcetam Stopped (08/13/17 0543)  . midazolam (VERSED) infusion 5 mg/hr (08/13/17 0656)  . norepinephrine (LEVOPHED) Adult infusion Stopped (08/13/17 0513)  .  propofol (DIPRIVAN) infusion Stopped (08/12/17 1210)    Assessment:Status epilepticus in the setting of seizure disorder following hemorrhagic stroke, EtOH abuse and recently having been taken off Dilantin.  1. Patient no longer in burst suppression after discontinuation of propofol. Continues on Versed at 5 mg/hr. 2. Covered with Dilantin at 100 mg q8h IV. Also on Keppra 1000 mg BID and Vimpat 200 mg BID. Will also continue after he has been weaned off Versed.  3. EEG with diffuse slowing on preliminary review this AM. Also with depolarizations in the right temporal region. No electrographic seizures seen. LTM EEG report for today is pending.  4. Status post cardiac arrest. Per Cardiology, the initial event in the grocery store appears most likely to be related to seizure.   Recommendations: 1. Would wean off Versed after starting scheduled IV Ativan for benzodiazepine withdrawal at 2 mg IV q8h. Subsequently, will need to taper him slowly off Ativan. .  2. Continue Dilantin at 100 mg q8h.Free and total levels are pending.  3. Continue Keppra 1000 mg BID and Vimpat 200 mg BID.  4. Extubate when able to tolerate.   35 minutes spent in the neurological evaluation and management of this critically ill patient. Time spent included EEG review.   LOS: 4 days   _0  signed: Dr. Kerney Elbe 08/13/2017  10:18 AM

## 2017-08-13 NOTE — Progress Notes (Addendum)
eLink Physician-Brief Progress Note Patient Name: [name] Cruz J Marsala DOB: Mar 27, 1955 MRN: [id number]   Date of Service  08/13/2017  HPI/Events of Note  constipation  eICU Interventions  Dulcolax tabs ordered        Migdalia DkOkoronkwo U Kenzie Flakes 08/13/2017, 9:35 PM

## 2017-08-13 NOTE — Progress Notes (Signed)
Received in report there was a delay in performing MRI during the day since there was not a RT available to assist in transport for ventilator. MRI called this evening to check for availability to perform patient's MRI. MRI reported they were currently receiving two patients and said the nearest opening will be around mid-night. RN spoke with RT to see when she would be available to assist with transport and she reported she would be available around midnight. RN will work to transport patient to MRI at midnight with RT.

## 2017-08-13 NOTE — Progress Notes (Signed)
LTM EEG checked, no skin breakdown noted. Paste added to Fp1, Fp2, C3, P3 and A1

## 2017-08-13 NOTE — Progress Notes (Signed)
D/C LTM EEG per Dr Lindzen 

## 2017-08-13 NOTE — Progress Notes (Addendum)
PULMONARY / CRITICAL CARE MEDICINE   Name: [name] Cruz MRN: [id number] DOB: July 27, 1955    ADMISSION DATE:  08/09/2017 CONSULTATION DATE:  08/09/17  REFERRING MD:  Dr. Fredderick PhenixBelfi / EDP   CHIEF COMPLAINT:  Seizure   BRIEF SUMMARY:   62 y/o M who [facility] via EMS with reports of seizures.    Per patients son, he was in line at [employer] and then had garbled speech, spun around and fell to the ground with seizure like activity.  EMS reported the patient was post-ictal on arrival to scene.  PMH of large hemorrhagic CVA in 2007 with residual speech difficulties.  The patient was alert/oriented to self & place on arrival to ER.  While in the room, he was witnessed to have altered speech and couldn't tell the staff where he was etc. He then had a seizure and was treated with 1mg  IV ativan.  He relaxed after about 30 seconds.     He was sent for CT head and while in the scanner he had an episode of ventricular tachycardia with arrest.  CPR was initiated and he was returned to the ER.  By the time he arrived to the ER, he was in a narrow complex rhythm without a pulse.  He was not shocked.  CPR total time ~ 10 minutes. Pt treated with amiodarone, EPI x2 and intubated without drugs. During intubation, he seized another time. Initial labs-Na 132, K 3.9, CL 99, CO2 22, glucose 206, BUN 10 / sr cr 0.85, Mg 1.8, WBC 7.9, Hgb 11.2 and platelets 231.  CXR pending.  CT of the head 7/27 showed no acute abnormality, moderate chronic small vessel disease, stable chronic ventriculomegaly compatible with left temporal occipital encephalomalacia / central volume loss. PCCM called for ICU admission.  Of note, on medication review, the patient last filled his phenytoin on 03/27/17 (takes 100mg  TID) for a three month supply.  Raises question of running out of meds?? Family thinks he had been out for a "little while".  Admitted for hypothermia protocol but patient was purposeful on arrival to ICU, discontinued.   Deep sedation initiated per Neuro for burst suppression. Lifting sedation 7/30  SUBJECTIVE:  RN reports Neurology wanting to stop versed gtt.  No seizures per report. Remains on continuous EEG. Tmax 100.8.  WBC wnl. I/O- net positive 1.5L, 2.2 L UOP in last 24 hours.   VITAL SIGNS: BP (!) 86/60   Pulse 87   Temp 99.7 F (37.6 C)   Resp 19   Ht 5\' 2"  (1.575 m)   Wt 169 lb 12.1 oz (77 kg)   SpO2 100%   BMI 31.05 kg/m   HEMODYNAMICS:    VENTILATOR SETTINGS: Vent Mode: CPAP;PSV FiO2 (%):  [30 %] 30 % Set Rate:  [18 bmp] 18 bmp Vt Set:  [440 mL] 440 mL PEEP:  [5 cmH20] 5 cmH20 Pressure Support:  [10 cmH20] 10 cmH20 Plateau Pressure:  [13 cmH20-16 cmH20] 16 cmH20  INTAKE / OUTPUT: I/O last 3 completed shifts: In: 3104.6 [I.V.:1066.5; NG/GT:1590; IV Piggyback:448.1] Out: 3065 [Urine:3065]  PHYSICAL EXAMINATION: General: adult male in NAD on vent HEENT: MM pink/moist, ETT, EEG electrodes intact, R IJ tlc Neuro: sedate (versed turned off at 1145 am), attempts to open eyes, no follow commands CV: s1s2 rrr, no m/r/g PULM: even/non-labored, lungs bilaterally coarse, creamy secretions from ETT BJ:YNWGGI:soft, non-tender, bsx4 active  Extremities: warm/dry, 1+ generalized edema  Skin: no rashes or lesions  LABS:  BMET  Recent Labs  Lab 08/11/17 0418 08/12/17 0411 08/13/17 0339  NA 142 141 143  K 3.3* 3.2* 3.8  CL 110 112* 106  CO2 25 21* 26  BUN <5* 5* 10  CREATININE 0.69 0.65 0.55*  GLUCOSE 108* 113* 141*    Electrolytes Recent Labs  Lab 08/09/17 1339  08/10/17 0315  08/11/17 0418 08/12/17 0411 08/13/17 0339  CALCIUM  --    < > 7.5*   < > 7.5* 7.7* 7.7*  MG 1.8  --  1.8  --   --   --   --   PHOS  --   --  3.0  --   --   --   --    < > = values in this interval not displayed.    CBC Recent Labs  Lab 08/10/17 0315 08/12/17 0411 08/13/17 0339  WBC 14.8* 7.8 8.3  HGB 11.0* 9.9* 9.8*  HCT 33.1* 30.6* 30.7*  PLT 259 146* 153    Coag's Recent Labs  Lab  08/09/17 1339  APTT 28  INR 1.04    ABG Recent Labs  Lab 08/09/17 1734 08/10/17 0506 08/12/17 0631  PHART 7.225* 7.462* 7.365  PCO2ART 52.7* 29.1* 39.2  PO2ART 79.0* 204* 89.0    Cardiac Enzymes Recent Labs  Lab 08/10/17 0315 08/10/17 0920 08/12/17 0813  TROPONINI 0.28* 0.15* 0.05*    Glucose Recent Labs  Lab 08/12/17 1208 08/12/17 1617 08/12/17 2017 08/12/17 2313 08/13/17 0337 08/13/17 0756  GLUCAP 147* 138* 116* 141* 136* 123*    Imaging Dg Chest Port 1 View  Result Date: 08/13/2017 CLINICAL DATA:  Respiratory failure EXAM: PORTABLE CHEST 1 VIEW COMPARISON:  08/12/2017 FINDINGS: Endotracheal tube, NG tube and right central line remain in place, unchanged. Mild cardiomegaly. Diffuse airspace disease throughout the right lung. No confluent opacity on the left. Possible small right effusion. No acute bony abnormality. IMPRESSION: Small right effusion. Diffuse airspace disease throughout the right lung, slightly increased since prior study. Low lung volumes. Electronically Signed   By: Charlett Nose M.D.   On: 08/13/2017 07:33    STUDIES:  CT Head 7/27 >> no acute abnormality, moderate chronic small vessel disease, stable chronic ventriculomegaly compatible with left temporal occipital encephalomalacia / central volume loss.  ECHO 7/27 >> LV normal size, mild focal basal hypertrophy of the septum, mildly reduced systolic function, LVEF 45-50%, AV moderately to severely calcified annulus, mild AR, PA peak 32 mmHg, IVC mildly dilated.  EEG 7/29 >> intermittent burst suppression EEG 7/30 >> burst suppression  MRI Brain 7/31 >>   CULTURES: BCx2 7/27 >>  Trach aspirate 7/27 >>  UA 7/27 >> negative    SIGNIFICANT EVENTS: 7/27  Admit to [facility] with seizure, suffered VT arrest in CT.  Had some purposeful movement after arrival to ICU, cooling protocol discontinued  7/28 EKG with new t-wave inversion 7/29 Remains on propofol, versed for burst suppression.  Requiring levo  due to sedation.  Amiodarone stopped, Cards consulted 7/30  QTc improved, burst suppression being lifted.  Remains on 5mg /hr versed 7/31  EEG stopped, versed stopped at 1145 am.  MRI pending.   LINES/TUBES: ETT 7/27 >> R IJ TLC 7/27 >>  L Radial ALine 7/27 >> 7/31  DISCUSSION: 62 y/o M admitted after seizure.  While in CT, he suffered a cardiac arrest with suspected VT as initial rhythm.  Arrest thought to be ~ 10 minutes.  He was transferred back to the ER with CPR in progress from the CT scanner  and on arrival had a narrow complex rhythm with no pulse. Intubated without drugs.  Seized during intubation.  Initially was to undergo hypothermia protocol but after arriving to ICU from ER, was thought to be purposeful and hypothermia protocol discontinued.  He has had continuous EEG with persistent seizures despite propofol/versed.  Suppression achieved 7/29. Propofol off 7/30. Versed gtt stopped 7/31.   ASSESSMENT / PLAN:  PULMONARY A: Acute Respiratory Insufficiency - in setting of seizure, AMS, cardiac arrest  Right Pleural Effusion - small, has been off home lasix P:   PRVC 8cc/kg  Wean PEEP / FiO2 for sats 88-95% Diuresis as below  Follow intermittent CR  VAP prevention measures  SBT / WUA when mental status permits  CARDIOVASCULAR A:  Cardiac Arrest - occurred 7/27 while on CT scanner, initial rhythm thought to be VT, then PEA with narrow complex.  Troponin essentially flat.  Prolonged QTc, New T-wave inversion - felt related to neuro status, +/- amiodarone Hx CAD s/p CABG 03/2017, HTN, HLD P:  ICU monitoring  Hold home losartan, coreg, atorvastatin, zetia  Lasix 40 mg IV BID x2 doses + KCL  Follow QTc intermittently, EKG in am    RENAL A:   Hyponatremia  Hypokalemia  P:   KCL supplementation with lasix.  Follow up mg in am Trend BMP / urinary output Replace electrolytes as indicated Avoid nephrotoxic agents, ensure adequate renal perfusion  GASTROINTESTINAL A:   At  Risk Malnutrition  P:   NPO / OGT care TF at goal, appreciate Nutrition  Continue prostat PPI   HEMATOLOGIC A:   Mild Anemia  P:  Trend CBC  Heparin for DVT prophylaxis  SCD's    INFECTIOUS A:   No evidence infectious process on admit. R/O aspiration. P:   Monitor fever curve (tmax 100.8) Discontinue aline, foley catheter   ENDOCRINE A:   Hyperglycemia - mild, suspect stress response    P:   SSI  NEUROLOGIC A:   Seizure - new on admit, ? If he had run out of phenytoin based on med review, family thinks he had been out "a little while".  Last filled for 3 mo supply on 03/27/17.   Heavy ETOH Use  P:   RASS goal: 0 to -1  Discontinue versed gtt  Begin 2mg  IV ativan Q8 at 8pm this evening given hx of ETOH abuse to avoid alcohol withdrawal seizure + PRN for seizure PRN fentanyl for pain Seizure precautions Continue vimpat, keppra, dilantin Appreciate Neurology imput  Family - Son Weideman) updated at bedside in detail 7/31.    CC Time: 30 minutes  Canary Brim, NP-C [facility] Pgr: [phone] or if no answer [phone] 08/13/2017, 11:09 AM

## 2017-08-13 NOTE — Procedures (Signed)
Electroencephalography report.  Long-term monitoring  Number study day 4  CPT 95951  Recording begins 08/12/2017 at 07 30 am  Recording and 7/31 /2019 at 7:30 AM  Data acquisition: International 10-20 for elective placement.  18 channels EEG with EKG channel.   This EEG was requested for this patient who presents with convulsions history of intracranial hemorrhage in the past.  Status post cardiac arrest.  Day 1 There was no pushbutton activation events during this recording.  Background activities were abnormal due to background activity slowing with superimposed near continues independent spikes in the left anterior temporal/frontal cortex maximum negativity at F7 and Fp1.  Spikes often occur in periodic rhythmic trains but without clear evolution.  In addition there is a independent right anterior temporal spikes maximum negativity at F7 present continuously throughout the recording at times was extended negative field and periodic fashion reminiscent of right hemispheric periodic lateralized epileptiform discharges.  In addition there is several electrographic seizures arising from right temporal region without obvious clinical accompaniment present.  At least not obvious on video monitoring.  There is a persistent faster frequencies present at midline and slight extension to the left paracentral region.  Could represent breach rhythm in that region in appropriate clinical setting.  During second half of the recording background activities evolve into more burst and attenuation in burst and suppression pattern with significant resolution of left hemispheric spikes.  Right temporal spikes still present however.  More blunted morphology and less prominent negative field.   Day 2 : No clinical or subclinical seizures present throughout the recording.  Background activities marked by burst and suppression pattern with 4-6 ratio.  Within such bursts of cerebral activities left hemispheric sharp  waves and spikes present with maximum negativity at left frontocentral and anterior frontal cortex.  Sometimes preceded by couple seconds of fast frequencies in that region.  Day 3; clinical or subclinical seizures present throughout the recording.  Background activities marked by burst and suppression pattern with largely 4-4 ratio.  Left paracentral and anterior frontal sharp waves present.  Day 4: No clinical or subclinical seizures present throughout the recording.  During first half of the recording background activities marked by burst and suppression pattern.  As recording progressed background activities changing to more continues background activity slowing ranging between 2 to 5 cps without anterior to posterior gradient.  Left hemispheric particular left frontocentral sharp waves and spikes were occasionally present throughout the recording however appeared to be improved during second half of the recording  Clinical interpretation: This day 4 of intensive EEG monitoring with simultaneous  video monitoring did not demonstrate any clinical or subclinical seizures.  Background activities tend to improve during second half of the recording asked burst suppression background activities changed to more continuous background activity slowing as discussed above.  Left hemispheric sharp waves also appear to be improved during second half of the recording.  Clinical correlation is advised.

## 2017-08-13 NOTE — Progress Notes (Signed)
Progress Note  Patient Name: [name] Cruz Date of Encounter: 08/13/2017  Primary Cardiologist:   Larae Grooms, MD   Subjective   Propofol off.  No clinical or EEG seizure activity.  Non responsive  Inpatient Medications    Scheduled Meds: . chlorhexidine gluconate (MEDLINE KIT)  15 mL Mouth Rinse BID  . Chlorhexidine Gluconate Cloth  6 each Topical Daily  . heparin injection (subcutaneous)  5,000 Units Subcutaneous Q8H  . insulin aspart  0-9 Units Subcutaneous Q4H  . mouth rinse  15 mL Mouth Rinse 10 times per day  . pantoprazole sodium  40 mg Per Tube Q1200  . phenytoin (DILANTIN) IV  100 mg Intravenous Q8H  . sodium chloride flush  10-40 mL Intracatheter Q12H   Continuous Infusions: . feeding supplement (VITAL AF 1.2 CAL) 60 mL/hr at 08/13/17 0400  . lacosamide (VIMPAT) IV Stopped (08/12/17 2254)  . levETIRAcetam 1,000 mg (08/13/17 0528)  . midazolam (VERSED) infusion 5 mg/hr (08/13/17 0500)  . norepinephrine (LEVOPHED) Adult infusion 1 mcg/min (08/13/17 0500)  . propofol (DIPRIVAN) infusion Stopped (08/12/17 1210)   PRN Meds: acetaminophen (TYLENOL) oral liquid 160 mg/5 mL, albuterol, fentaNYL (SUBLIMAZE) injection, hydrALAZINE, sodium chloride flush   Vital Signs    Vitals:   08/13/17 0330 08/13/17 0340 08/13/17 0345 08/13/17 0500  BP:  96/64    Pulse: 86 89 90   Resp: '15 18 19   '$ Temp: 99.9 F (37.7 C)  99.9 F (37.7 C)   TempSrc:      SpO2: 100% 100% 100%   Weight:    169 lb 12.1 oz (77 kg)  Height:    '5\' 2"'$  (1.575 m)    Intake/Output Summary (Last 24 hours) at 08/13/2017 0612 Last data filed at 08/13/2017 0500 Gross per 24 hour  Intake 2021.72 ml  Output 2210 ml  Net -188.28 ml   Filed Weights   08/09/17 1657 08/12/17 0213 08/13/17 0500  Weight: 169 lb 12.1 oz (77 kg) 174 lb 2.6 oz (79 kg) 169 lb 12.1 oz (77 kg)    Telemetry    NSR - Personally Reviewed  ECG    NSR, rate 80, QT prolonged, deep anterior and lateral T wave inversion -  Personally Reviewed  Physical Exam   GEN: No  acute distress.   Neck: No  JVD Cardiac: RRR, no murmurs, rubs, or gallops.  Respiratory:    Decreased breath sounds.  No wheezing.   GI: Soft, nontender, non-distended, normal bowel sounds  MS:  Mild difuse edema; No deformity. Neuro:   [facility]    Chemistry Recent Labs  Lab 08/11/17 0418 08/12/17 0411 08/13/17 0339  NA 142 141 143  K 3.3* 3.2* 3.8  CL 110 112* 106  CO2 25 21* 26  GLUCOSE 108* 113* 141*  BUN <5* 5* 10  CREATININE 0.69 0.65 0.55*  CALCIUM 7.5* 7.7* 7.7*  PROT 4.8*  --   --   ALBUMIN 2.6*  --   --   AST 51*  --   --   ALT 41  --   --   ALKPHOS 58  --   --   BILITOT 0.7  --   --   GFRNONAA >60 >60 >60  GFRAA >60 >60 >60  ANIONGAP '7 8 11     '$ Hematology Recent Labs  Lab 08/10/17 0315 08/12/17 0411 08/13/17 0339  WBC 14.8* 7.8 8.3  RBC 3.48* 3.13* 3.11*  HGB 11.0* 9.9* 9.8*  HCT 33.1* 30.6* 30.7*  MCV 95.1 97.8 98.7  MCH 31.6 31.6 31.5  MCHC 33.2 32.4 31.9  RDW 15.6* 16.6* 16.4*  PLT 259 146* 153    Cardiac Enzymes Recent Labs  Lab 08/09/17 2115 08/10/17 0315 08/10/17 0920 08/12/17 0813  TROPONINI 0.28* 0.28* 0.15* 0.05*   No results for input(s): TROPIPOC in the last 168 hours.   BNPNo results for input(s): BNP, PROBNP in the last 168 hours.   DDimer No results for input(s): DDIMER in the last 168 hours.   Radiology    Dg Chest Port 1 View  Result Date: 08/12/2017 CLINICAL DATA:  ET tube, respiratory failure, hypoxia EXAM: PORTABLE CHEST 1 VIEW COMPARISON:  08/11/2017 FINDINGS: Right central line, endotracheal tube, NG tube remain in place, unchanged. Continued layering right effusion with right base atelectasis or infiltrate. No confluent opacity on the left. Mild cardiomegaly. Prior CABG. IMPRESSION: Continue small right effusion and right lower lobe atelectasis or infiltrate. No pneumothorax. No change. Electronically Signed   By: Rolm Baptise M.D.   On: 08/12/2017 09:14    Dg Chest Port 1 View  Result Date: 08/11/2017 CLINICAL DATA:  Inhibition EXAM: PORTABLE CHEST 1 VIEW COMPARISON:  Chest x-rays dated 08/10/2017 and 08/09/2017. FINDINGS: Endotracheal tube is well positioned approximately 3 cm above the carina. Enteric tube passes below the diaphragm. RIGHT IJ central line is well positioned with tip at the level of the lower SVC/cavoatrial junction. Pacer pads overlie the lower heart. Lungs appear clear.  No pleural effusion or pneumothorax seen. IMPRESSION: 1. Support apparatus appears appropriately positioned. 2. No active disease.  Lungs appear clear. Electronically Signed   By: Franki Cabot M.D.   On: 08/11/2017 14:28   Dg Abd Portable 1v  Result Date: 08/11/2017 CLINICAL DATA:  Feeding tube placement. EXAM: PORTABLE ABDOMEN - 1 VIEW COMPARISON:  None. FINDINGS: Enteric tube is positioned in the stomach. Visualized bowel gas pattern is nonobstructive. No evidence of free intraperitoneal air seen. IMPRESSION: Feeding tube in the stomach with tip directed towards the gastric antrum. Electronically Signed   By: Franki Cabot M.D.   On: 08/11/2017 14:29    Cardiac Studies   Echo:    Study Conclusions  - Left ventricle: The cavity size was normal. There was mild focal   basal hypertrophy of the septum. Systolic function was mildly   reduced. The estimated ejection fraction was in the range of 45%   to 50%. Mild diffuse hypokinesis. The study is not technically   sufficient to allow evaluation of LV diastolic function. - Aortic valve: Moderately to severely calcified annulus.   Trileaflet; normal thickness, moderately calcified leaflets.   There was mild regurgitation. - Pulmonary arteries: PA peak pressure: 32 mm Hg (S). - Inferior vena cava: The vessel was mildly dilated.   Patient Profile     62 y.o. male with history ofhemorrhagic stroke (thought to be secondary to uncontrolled hypertension now with residual cognitive impairment), hypertension,  hyperlipidemia, CAD and atrial fib.  He had atrial fib in Feb of this year.  He had an NSTEMI and was found to have 3 vessel CAD.  He had CABG.  He had a mildy reduced EF of 45% at that time.   He was admitted now with altered mentation and a seizure.  We were called after he had ventricular tachycardia and required CPR in the CT scanner.  He has a markedly abnormal EKG.    Assessment & Plan    Ventricular tachycardia:  Cardiac arrest in the hospital. Minimal trop  elevation with EF mildly reduced an unchanged form previous.  No further arrhythmias.  Holding meds with hypotension.    Abnormal EKG:  Essentially unchanged as above.   Supportive care.  We will follow as needed and will follow up EKGs in the days to come.  No further cardiac work up at this time.   For questions or updates, please contact [place] Please consult [email or web address] for contact info under Cardiology/STEMI.   Signed, Minus Breeding, MD  08/13/2017, 6:12 AM

## 2017-08-14 ENCOUNTER — Inpatient Hospital Stay (HOSPITAL_COMMUNITY): Payer: Medicare Other

## 2017-08-14 ENCOUNTER — Encounter (HOSPITAL_COMMUNITY): Payer: Self-pay | Admitting: Radiology

## 2017-08-14 DIAGNOSIS — M7989 Other specified soft tissue disorders: Secondary | ICD-10-CM

## 2017-08-14 DIAGNOSIS — R609 Edema, unspecified: Secondary | ICD-10-CM

## 2017-08-14 LAB — GLUCOSE, CAPILLARY
GLUCOSE-CAPILLARY: 138 mg/dL — AB (ref 70–99)
GLUCOSE-CAPILLARY: 141 mg/dL — AB (ref 70–99)
GLUCOSE-CAPILLARY: 142 mg/dL — AB (ref 70–99)
Glucose-Capillary: 112 mg/dL — ABNORMAL HIGH (ref 70–99)
Glucose-Capillary: 122 mg/dL — ABNORMAL HIGH (ref 70–99)
Glucose-Capillary: 125 mg/dL — ABNORMAL HIGH (ref 70–99)
Glucose-Capillary: 142 mg/dL — ABNORMAL HIGH (ref 70–99)

## 2017-08-14 LAB — PHENYTOIN LEVEL, FREE AND TOTAL
PHENYTOIN FREE: 1.7 ug/mL (ref 1.0–2.0)
Phenytoin, Total: 11.8 ug/mL (ref 10.0–20.0)

## 2017-08-14 LAB — BASIC METABOLIC PANEL
Anion gap: 10 (ref 5–15)
BUN: 13 mg/dL (ref 8–23)
CHLORIDE: 106 mmol/L (ref 98–111)
CO2: 28 mmol/L (ref 22–32)
Calcium: 8 mg/dL — ABNORMAL LOW (ref 8.9–10.3)
Creatinine, Ser: 0.59 mg/dL — ABNORMAL LOW (ref 0.61–1.24)
GFR calc Af Amer: >60 mL/min (ref >60)
GFR calc non Af Amer: >60 mL/min (ref >60)
GLUCOSE: 146 mg/dL — AB (ref 70–99)
POTASSIUM: 3.5 mmol/L (ref 3.5–5.1)
Sodium: 144 mmol/L (ref 135–145)

## 2017-08-14 LAB — CBC
HCT: 31.4 % — ABNORMAL LOW (ref 39.0–52.0)
Hemoglobin: 9.6 g/dL — ABNORMAL LOW (ref 13.0–17.0)
MCH: 30.4 pg (ref 26.0–34.0)
MCHC: 30.6 g/dL (ref 30.0–36.0)
MCV: 99.4 fL (ref 78.0–100.0)
Platelets: 187 10*3/uL (ref 150–400)
RBC: 3.16 MIL/uL — AB (ref 4.22–5.81)
RDW: 16.5 % — ABNORMAL HIGH (ref 11.5–15.5)
WBC: 8.4 10*3/uL (ref 4.0–10.5)

## 2017-08-14 LAB — CULTURE, BLOOD (ROUTINE X 2)
Culture: NO GROWTH
Special Requests: ADEQUATE

## 2017-08-14 LAB — MAGNESIUM: Magnesium: 2.1 mg/dL (ref 1.7–2.4)

## 2017-08-14 LAB — HEPARIN LEVEL (UNFRACTIONATED)

## 2017-08-14 MED ORDER — FAMOTIDINE 40 MG/5ML PO SUSR
20.0000 mg | Freq: Every day | ORAL | Status: DC
  Administered 2017-08-14 – 2017-08-15 (×2): 20 mg via ORAL
  Filled 2017-08-14 (×2): qty 2.5

## 2017-08-14 MED ORDER — HEPARIN (PORCINE) IN NACL 100-0.45 UNIT/ML-% IJ SOLN
1550.0000 [IU]/h | INTRAMUSCULAR | Status: DC
  Administered 2017-08-14: 1150 [IU]/h via INTRAVENOUS
  Administered 2017-08-15: 1550 [IU]/h via INTRAVENOUS
  Administered 2017-08-15: 1350 [IU]/h via INTRAVENOUS
  Administered 2017-08-15 – 2017-08-18 (×4): 1550 [IU]/h via INTRAVENOUS
  Filled 2017-08-14 (×7): qty 250

## 2017-08-14 MED ORDER — HEPARIN BOLUS VIA INFUSION
2000.0000 [IU] | Freq: Once | INTRAVENOUS | Status: AC
Start: 2017-08-14 — End: 2017-08-14
  Administered 2017-08-14: 2000 [IU] via INTRAVENOUS
  Filled 2017-08-14: qty 2000

## 2017-08-14 MED ORDER — ATORVASTATIN CALCIUM 80 MG PO TABS
80.0000 mg | ORAL_TABLET | Freq: Every day | ORAL | Status: DC
Start: 2017-08-14 — End: 2017-08-15
  Administered 2017-08-14 – 2017-08-15 (×2): 80 mg via ORAL
  Filled 2017-08-14 (×2): qty 1

## 2017-08-14 MED ORDER — POTASSIUM CHLORIDE 10 MEQ/50ML IV SOLN
10.0000 meq | INTRAVENOUS | Status: AC
  Administered 2017-08-14 (×3): 10 meq via INTRAVENOUS
  Filled 2017-08-14 (×3): qty 50

## 2017-08-14 MED ORDER — PIPERACILLIN-TAZOBACTAM 3.375 G IVPB
3.3750 g | Freq: Three times a day (TID) | INTRAVENOUS | Status: DC
Start: 2017-08-14 — End: 2017-08-17
  Administered 2017-08-14 – 2017-08-17 (×9): 3.375 g via INTRAVENOUS
  Filled 2017-08-14 (×11): qty 50

## 2017-08-14 MED ORDER — HEPARIN BOLUS VIA INFUSION
4000.0000 [IU] | Freq: Once | INTRAVENOUS | Status: AC
  Administered 2017-08-14: 4000 [IU] via INTRAVENOUS
  Filled 2017-08-14: qty 4000

## 2017-08-14 NOTE — Progress Notes (Signed)
ANTICOAGULATION CONSULT NOTE - Follow-Up Consult  Pharmacy Consult for Heparin Management Indication: DVT  No Known Allergies  Patient Measurements: Height: '5\' 2"'$  (157.5 cm) Weight: 163 lb 9.3 oz (74.2 kg) IBW/kg (Calculated) : 54.6 HEPARIN DW (KG): 70.9   Vital Signs: Temp: 100.7 F (38.2 C) (08/01 2000) Temp Source: Axillary (08/01 2000) BP: 98/59 (08/01 2000) Pulse Rate: 102 (08/01 2000)  Labs: Recent Labs    08/12/17 0411 08/12/17 0813 08/13/17 0339 08/14/17 0310 08/14/17 2032  HGB 9.9*  --  9.8* 9.6*  --   HCT 30.6*  --  30.7* 31.4*  --   PLT 146*  --  153 187  --   HEPARINUNFRC  --   --   --   --  <0.10*  CREATININE 0.65  --  0.55* 0.59*  --   TROPONINI  --  0.05*  --   --   --     Estimated Creatinine Clearance: 84.5 mL/min (A) (by C-G formula based on SCr of 0.59 mg/dL (L)).   Medical History: Past Medical History:  Diagnosis Date  . Cognitive impairment 2007   after stroke, saw rehab but told to stop because was too upsetting to him  . History of chicken pox   . HLD (hyperlipidemia)   . HTN (hypertension)   . NSTEMI (non-ST elevated myocardial infarction) ([place]) 02/21/2017  . Obesity   . Stroke, hemorrhagic ([place]) 2007   thought 2/2 HTN (240sbp); residual cognitive impairment, loss of R peripheral field, no driving    Medications:  Scheduled:  . atorvastatin  80 mg Oral Daily  . chlorhexidine gluconate (MEDLINE KIT)  15 mL Mouth Rinse BID  . Chlorhexidine Gluconate Cloth  6 each Topical Daily  . famotidine  20 mg Oral QHS  . furosemide  20 mg Intravenous Daily  . insulin aspart  0-9 Units Subcutaneous Q4H  . LORazepam  2 mg Intravenous Q8H  . mouth rinse  15 mL Mouth Rinse 10 times per day  . phenytoin (DILANTIN) IV  100 mg Intravenous Q8H  . sodium chloride flush  10-40 mL Intracatheter Q12H   Infusions:  . feeding supplement (VITAL AF 1.2 CAL) 1,500 mL (08/14/17 1525)  . heparin 1,150 Units/hr (08/14/17 2025)  . lacosamide (VIMPAT) IV 200  mg (08/14/17 2115)  . levETIRAcetam Stopped (08/14/17 1647)  . piperacillin-tazobactam (ZOSYN)  IV 3.375 g (08/14/17 2110)    Assessment: Pt found to have new DVT in left upper arm. Pharmacy consulted to manage heparin therapy. Hemoglobin low, but stable; pltc increasing. No bleeding noted.    Initial heparin level undetectable on 1150 units/hr - drawn appropriately per RN and infusion is running without issues.  Goal of Therapy:  Heparin level 0.3-0.7 units/ml Monitor platelets by anticoagulation protocol: Yes   Plan:  -Heparin 2000 units x1 -Increase infusion to 1350 units/hr -Recheck heparin level in 6 hr  Arrie Senate, PharmD, BCPS Clinical Pharmacist [phone] Please check AMION for all [facility] Pharmacy numbers 08/14/2017

## 2017-08-14 NOTE — Progress Notes (Signed)
Per Dr. Georg RuddleLinzen, neurology, OK to start IV heparin for LUE DVT. [place], Mitzi HansenJessica Marie

## 2017-08-14 NOTE — Progress Notes (Signed)
ANTICOAGULATION CONSULT NOTE - Initial Consult  Pharmacy Consult for Heparin Management Indication: DVT  No Known Allergies  Patient Measurements: Height: '5\' 2"'$  (157.5 cm) Weight: 163 lb 9.3 oz (74.2 kg) IBW/kg (Calculated) : 54.6 HEPARIN DW (KG): 70.9   Vital Signs: Temp: 101 F (38.3 C) (08/01 1207) Temp Source: Axillary (08/01 1207) BP: 82/48 (08/01 1207) Pulse Rate: 97 (08/01 1200)  Labs: Recent Labs    08/12/17 0411 08/12/17 0813 08/13/17 0339 08/14/17 0310  HGB 9.9*  --  9.8* 9.6*  HCT 30.6*  --  30.7* 31.4*  PLT 146*  --  153 187  CREATININE 0.65  --  0.55* 0.59*  TROPONINI  --  0.05*  --   --     Estimated Creatinine Clearance: 84.5 mL/min (A) (by C-G formula based on SCr of 0.59 mg/dL (L)).   Medical History: Past Medical History:  Diagnosis Date  . Cognitive impairment 2007   after stroke, saw rehab but told to stop because was too upsetting to him  . History of chicken pox   . HLD (hyperlipidemia)   . HTN (hypertension)   . NSTEMI (non-ST elevated myocardial infarction) ([place]) 02/21/2017  . Obesity   . Stroke, hemorrhagic ([place]) 2007   thought 2/2 HTN (240sbp); residual cognitive impairment, loss of R peripheral field, no driving    Medications:  Scheduled:  . atorvastatin  80 mg Oral Daily  . chlorhexidine gluconate (MEDLINE KIT)  15 mL Mouth Rinse BID  . Chlorhexidine Gluconate Cloth  6 each Topical Daily  . famotidine  20 mg Oral QHS  . furosemide  20 mg Intravenous Daily  . heparin injection (subcutaneous)  5,000 Units Subcutaneous Q8H  . insulin aspart  0-9 Units Subcutaneous Q4H  . LORazepam  2 mg Intravenous Q8H  . mouth rinse  15 mL Mouth Rinse 10 times per day  . phenytoin (DILANTIN) IV  100 mg Intravenous Q8H  . sodium chloride flush  10-40 mL Intracatheter Q12H   Infusions:  . feeding supplement (VITAL AF 1.2 CAL) 60 mL/hr at 08/14/17 0700  . lacosamide (VIMPAT) IV 200 mg (08/14/17 0923)  . levETIRAcetam Stopped (08/14/17 0500)   . piperacillin-tazobactam (ZOSYN)  IV      Assessment: Pt found to have new DVT in left upper arm. Pharmacy consulted to manage heparin therapy. Hemoglobin low, but stable; pltc increasing. No bleeding noted.    Goal of Therapy:  Heparin level 0.3-0.7 units/ml Monitor platelets by anticoagulation protocol: Yes   Plan:  Give 4000 units bolus x 1 Start heparin infusion at 1150 units/hr Check anti-Xa level in 6 hours and daily while on heparin Continue to monitor H&H and platelets  Claiborne Billings, PharmD PGY2 Cardiology Pharmacy Resident Phone [phone] 08/14/2017 1:10 PM

## 2017-08-14 NOTE — Progress Notes (Addendum)
PULMONARY / CRITICAL CARE MEDICINE  Name: [name] Cruz Thrun MRN: [id number] DOB: 10/10/1955    ADMISSION DATE:  08/09/2017 CONSULTATION DATE:  08/09/17  REFERRING MD:  Dr. Fredderick PhenixBelfi / EDP   CHIEF COMPLAINT:  Seizure   BRIEF SUMMARY:   62 y/o M who [facility] via EMS with reports of seizures.    Per patients son, he was in line at [employer] and then had garbled speech, spun around and fell to the ground with seizure like activity.  EMS reported the patient was post-ictal on arrival to scene.  PMH of large hemorrhagic CVA in 2007 with residual speech difficulties.  The patient was alert/oriented to self & place on arrival to ER.  While in the room, he was witnessed to have altered speech and couldn't tell the staff where he was etc. He then had a seizure and was treated with 1mg  IV ativan.  He relaxed after about 30 seconds.     He was sent for CT head and while in the scanner he had an episode of ventricular tachycardia with arrest.  CPR was initiated and he was returned to the ER.  By the time he arrived to the ER, he was in a narrow complex rhythm without a pulse.  He was not shocked.  CPR total time ~ 10 minutes. Pt treated with amiodarone, EPI x2 and intubated without drugs. During intubation, he seized another time. Initial labs-Na 132, K 3.9, CL 99, CO2 22, glucose 206, BUN 10 / sr cr 0.85, Mg 1.8, WBC 7.9, Hgb 11.2 and platelets 231.  CXR pending.  CT of the head 7/27 showed no acute abnormality, moderate chronic small vessel disease, stable chronic ventriculomegaly compatible with left temporal occipital encephalomalacia / central volume loss. PCCM called for ICU admission.  Of note, on medication review, the patient last filled his phenytoin on 03/27/17 (takes 100mg  TID) for a three month supply.  Raises question of running out of meds?? Family thinks he had been out for a "little while".  Admitted for hypothermia protocol but patient was purposeful on arrival to ICU, discontinued.   Deep sedation initiated per Neuro for burst suppression.  7/30 - removed continuous sedation, off propofol and versed  8/1 - awaiting mental status to clear to attempt SBT    SUBJECTIVE:  Was slightly hypotensive overnight.  Low-grade temperatures in the 100s.  MRI completed yesterday.  Foley removed the other day.  Bladder scan overnight with approximately 700 in and out cath.  Currently on in and out nursing cath protocol.  If this continues to retain will replace Foley.  Care management plan discussed with nursing at bedside.  Nursing expressed concerns for increased sputum production and suctioning from the ventilator tubing.  VITAL SIGNS: BP 99/64   Pulse (!) 103   Temp 100.1 F (37.8 C) (Oral)   Resp 18   Ht 5\' 2"  (1.575 m)   Wt 163 lb 9.3 oz (74.2 kg)   SpO2 100%   BMI 29.92 kg/m   HEMODYNAMICS:    VENTILATOR SETTINGS: Vent Mode: CPAP;PSV FiO2 (%):  [30 %] 30 % Set Rate:  [18 bmp] 18 bmp Vt Set:  [440 mL] 440 mL PEEP:  [5 cmH20] 5 cmH20 Pressure Support:  [5 cmH20-10 cmH20] 5 cmH20 Plateau Pressure:  [14 cmH20-15 cmH20] 14 cmH20  INTAKE / OUTPUT: I/O last 3 completed shifts: In: 2803 [I.V.:101.2; NG/GT:2160; IV Piggyback:541.8] Out: 3599 [Urine:3599]  PHYSICAL EXAMINATION: General: Elderly male, intubated, comatose on ventilator  HEENT: Mucous membranes moist, sclera clear, pupils reactive.  ET tube in place Neuro: Opens eyes to voice.  Does not follow commands.  No movement of limbs spontaneously depressed reflexes CV: Regular rate and rhythm, S1-S2, no murmurs with PULM: Bilateral ventilated breath sounds, bilateral rhonchi GI: Soft, nontender, nondistended, bowel sounds present Extremities: Left upper extremity swollen, slightly edematous, larger than the right.,  Radial DP present laterally Skin: Bruising from PIV's, no significant edema in the lower extremities.  LABS:  BMET Recent Labs  Lab 08/12/17 0411 08/13/17 0339 08/14/17 0310  NA 141 143 144   K 3.2* 3.8 3.5  CL 112* 106 106  CO2 21* 26 28  BUN 5* 10 13  CREATININE 0.65 0.55* 0.59*  GLUCOSE 113* 141* 146*    Electrolytes Recent Labs  Lab 08/09/17 1339  08/10/17 0315  08/12/17 0411 08/13/17 0339 08/14/17 0310  CALCIUM  --    < > 7.5*   < > 7.7* 7.7* 8.0*  MG 1.8  --  1.8  --   --   --  2.1  PHOS  --   --  3.0  --   --   --   --    < > = values in this interval not displayed.    CBC Recent Labs  Lab 08/12/17 0411 08/13/17 0339 08/14/17 0310  WBC 7.8 8.3 8.4  HGB 9.9* 9.8* 9.6*  HCT 30.6* 30.7* 31.4*  PLT 146* 153 187    Coag's Recent Labs  Lab 08/09/17 1339  APTT 28  INR 1.04    ABG Recent Labs  Lab 08/09/17 1734 08/10/17 0506 08/12/17 0631  PHART 7.225* 7.462* 7.365  PCO2ART 52.7* 29.1* 39.2  PO2ART 79.0* 204* 89.0    Cardiac Enzymes Recent Labs  Lab 08/10/17 0315 08/10/17 0920 08/12/17 0813  TROPONINI 0.28* 0.15* 0.05*    Glucose Recent Labs  Lab 08/13/17 1155 08/13/17 1616 08/13/17 2002 08/13/17 2313 08/14/17 0302 08/14/17 0750  GLUCAP 120* 125* 104* 103* 138* 141*    Imaging Mr Brain Wo Contrast  Result Date: 08/14/2017 CLINICAL DATA:  Initial evaluation status post cardiac arrest. Inciting event likely seizure. History of prior hemorrhagic stroke. EXAM: MRI HEAD WITHOUT CONTRAST TECHNIQUE: Multiplanar, multiecho pulse sequences of the brain and surrounding structures were obtained without intravenous contrast. COMPARISON:  Prior CT from 08/09/2017 as well as previous MRI from 04/27/12. FINDINGS: Brain: Generalized age-related cerebral atrophy. Confluent T2/FLAIR hyperintensity within the periventricular deep white matter both cerebral hemispheres most consistent with chronic small vessel ischemic change, moderate nature. Superimposed small remote lacunar infarcts at the right basal ganglia and thalamus. Chronic encephalomalacia at the left periatrial region and left temporal lobe with associated chronic hemosiderin staining,  consistent with prior hemorrhagic infarction in this region. Associated ex vacuo dilatation of the left lateral ventricle, similar to previous. Multiple DISH in ule subcentimeter chronic micro hemorrhages seen involving the supratentorial and infratentorial brain, suspected to be related to chronic underlying hypertension. These appear progressed as compared to previous study. Single punctate 6 mm focus of diffusion abnormality at the cortical/subcortical region of the right parietal lobe (series 5, image 61), suspicious for a small acute ischemic infarct (series 5, image 61). No associated hemorrhage. No other evidence for acute or subacute infarction. Fairly symmetric increased FLAIR signal abnormality with associated diffusion signal seen involving the mesial temporal lobes/hippocampi bilaterally, likely reflecting sequelae of seizure (series 7, image 49). No mass lesion. No midline shift or mass effect. Ventricular prominence related to global parenchymal  volume loss and ex vacuo dilatation without hydrocephalus. No extra-axial fluid collection. Pituitary gland within normal limits. Vascular: Major intracranial vascular flow voids maintained Skull and upper cervical spine: Craniocervical junction normal. Bone marrow signal intensity diffusely decreased on T1 weighted sequence within the upper cervical spine, most commonly related to anemia, smoking, or obesity. No focal marrow replacing lesion. No scalp soft tissue abnormality. Sinuses/Orbits: Globes and orbital soft tissues demonstrate no acute finding. Fluid seen layering within the nasopharynx. Patient is intubated. Bilateral mastoid effusions noted. Other: None. IMPRESSION: 1. Symmetric increased FLAIR and diffusion signal abnormality involving the mesial temporal lobes/hippocampi bilaterally. Given history, changes most likely reflect sequelae of seizure. 2. 6 mm acute ischemic right parietal cortical/subcortical infarct. 3. Sequelae of remote hemorrhagic  infarction at the left periatrial/temporal region, stable. 4. Multiple additional chronic micro hemorrhages scattered throughout the brain, likely related to chronic underlying hypertension. These appear progressed relative to 2014. 5. Atrophy with moderate chronic small vessel ischemic disease. Electronically Signed   By: Rise Mu M.D.   On: 08/14/2017 02:12    STUDIES:  CT Head 7/27 >> no acute abnormality, moderate chronic small vessel disease, stable chronic ventriculomegaly compatible with left temporal occipital encephalomalacia / central volume loss.  ECHO 7/27 >> LV normal size, mild focal basal hypertrophy of the septum, mildly reduced systolic function, LVEF 45-50%, AV moderately to severely calcified annulus, mild AR, PA peak 32 mmHg, IVC mildly dilated.  EEG 7/29 >> intermittent burst suppression EEG 7/30 >> burst suppression  MRI Brain 7/31 >> chronic ischemic changes, FLAIR imaging consistent with status  CULTURES: BCx2 7/27 >> no growth to date UA 7/27 >> negative  Trach aspirate 08/14/2017>> pending   SIGNIFICANT EVENTS: 7/27  Admit to [facility] with seizure, suffered VT arrest in CT.  Had some purposeful movement after arrival to ICU, cooling protocol discontinued  7/28 EKG with new t-wave inversion 7/29 Remains on propofol, versed for burst suppression.  Requiring levo due to sedation.  Amiodarone stopped, Cards consulted 7/30  QTc improved, burst suppression being lifted.  Remains on 5mg /hr versed 7/31  EEG stopped, versed stopped at 1145 am.  MRI brain.   LINES/TUBES: ETT 7/27 >> R IJ TLC 7/27 >>  L Radial ALine 7/27 >> 7/31  DISCUSSION: 62 y/o M admitted after seizure.  While in CT, he suffered a cardiac arrest with suspected VT as initial rhythm.  Arrest thought to be ~ 10 minutes.  He was transferred back to the ER with CPR in progress from the CT scanner and on arrival had a narrow complex rhythm with no pulse. Intubated without drugs.  Seized during  intubation.  Initially was to undergo hypothermia protocol but after arriving to ICU from ER, was thought to be purposeful and hypothermia protocol discontinued.  He has had continuous EEG with persistent seizures despite propofol/versed.  Suppression achieved 7/29. Propofol off 7/30. Versed gtt stopped 7/31.  08/14/2017 and pressure support, neuro status preventing attempts at extubation.  ASSESSMENT / PLAN:  Acute hypoxemic respiratory failure, intubated on mechanical ventilation in setting of status epilepticus to maintain airway patency.  Possible aspiration pneumonia secondary to events surrounding cardiac arrest. Small right Pleural Effusion  P:   We will place in pressure support, off sedation to continue spontaneous breathing trial Unable to attempt extubation secondary to depressed neuro status and acute metabolic encephalopathy Tolerating pressure support 5/5 and 30% FiO2  In-hospital cardiac Arrest, VT then to PEA arrest Elevated troponin Prolonged QTc, New T-wave inversion, secondary to status  epilepticus also on amiodarone at the time Hx CAD s/p CABG 03/2017, HTN, HLD Acute on chronic systolic heart failure, EF 40 to 45% P:  Patient remains in the intensive care unit for close hemodynamic monitoring Continue to hold home losartan, Coreg, hold Zetia Can restart home atorvastatin Monitor telemetry for QT changes Low-dose daily diuresis to maintain euvolemia,  will follow electrolytes   Hyponatremia  Hypokalemia  Acute urinary retention requiring in and out catheterization P:   We will replace electrolytes as needed, follow BMP and urinary output Avoid nephrotoxic drugs Will do in and out catheterization up to 3 times and then transition back to Foley insertion if needed  At Risk Malnutrition  On tube feeds P:   Continue tube feeds at goal Switched PPI to H2 blocker  Mild Anemia, likely ICU acquired, multifactorial P:  We will follow with CBC Heparin and SCDs for DVT  PPX  Possible aspiration pneumonia P:   We will continue to monitor fever curve as well as white blood cell count Tracheal aspirate sent on 08/14/2017  Hyperglycemia - mild, suspect stress response    P:   SSI  Status epilepticus, secondary to medication noncompliance Chronic microvascular disease of the brain is seen on MRI 6 mm right parietal, cortical/subcortical infarct, CVA Heavy ETOH Use  P:  RASS goal 0 to -1 Remains on Vimpat, Keppra, Dilantin as well as scheduled Ativan per neurology recommendations Will allow neurology to taper benzodiazepines as they see fit Once mental status improves will be able to attempt extubation We appreciate neurology's recommendations and input  Left upper extremity swelling as compared to right We will obtain left upper extremity vascular duplex to rule out upper extremity DVT.  Family - Son Auman) updated at bedside in detail 7/31 by Ms. Veleta Miners, NP for critical care  The patient is critically ill with multiple organ systems failure and requires high complexity decision making for assessment and support, frequent evaluation and titration of therapies, application of advanced monitoring technologies and extensive interpretation of multiple databases. Critical Care Time devoted to patient care services described in this note is 33 minutes.  Josephine Igo, DO [facility] 08/14/2017

## 2017-08-14 NOTE — Plan of Care (Signed)
  Problem: Clinical Measurements: Goal: Ability to maintain clinical measurements within normal limits will improve Outcome: Progressing Goal: Will remain free from infection Outcome: Progressing Goal: Respiratory complications will improve Outcome: Progressing Goal: Cardiovascular complication will be avoided Outcome: Progressing   Problem: Nutrition: Goal: Adequate nutrition will be maintained Outcome: Progressing   Problem: Elimination: Goal: Will not experience complications related to bowel motility Outcome: Progressing   Problem: Pain Managment: Goal: General experience of comfort will improve Outcome: Progressing   Problem: Safety: Goal: Ability to remain free from injury will improve Outcome: Progressing   Problem: Skin Integrity: Goal: Risk for impaired skin integrity will decrease Outcome: Progressing   Problem: Education: Goal: Knowledge of General Education information will improve Description Including pain rating scale, medication(s)/side effects and non-pharmacologic comfort measures Outcome: Not Progressing   Problem: Health Behavior/Discharge Planning: Goal: Ability to manage health-related needs will improve Outcome: Not Progressing   Problem: Clinical Measurements: Goal: Diagnostic test results will improve Outcome: Not Progressing   Problem: Activity: Goal: Risk for activity intolerance will decrease Outcome: Not Progressing   Problem: Elimination: Goal: Will not experience complications related to urinary retention Outcome: Not Progressing

## 2017-08-14 NOTE — Progress Notes (Signed)
   Chart reviewed.  No arrhythmias on tele. No new suggestions today.  We will continue to follow as needed.

## 2017-08-14 NOTE — Progress Notes (Signed)
Dr. Tonia BroomsIcard notified of positive DVT in left upper arm, decreasing blood pressures and increasing temps; verbalized need for Neurology's input for IV heparin as well as pharmacy consult for Zosyn.  Neurology MD paged. [place], Mitzi HansenJessica Marie

## 2017-08-14 NOTE — Progress Notes (Signed)
   08/14/17 0104 08/14/17 0109 08/14/17 0113  Vitals  BP 111/62 (!) 93/54 (!) 95/57  MAP (mmHg) 74 (!) 63 66  Pulse Rate 98 95 96  Oxygen Therapy  SpO2 98 % 98 % 95 %    08/14/17 0118 08/14/17 0120 08/14/17 0125  Vitals  BP (!) 92/54 (!) 93/59 94/60  MAP (mmHg) (!) 63 67 67  Pulse Rate 96 95 95  Oxygen Therapy  SpO2 98 % 98 % 98 %  Patient transported to and from MRI with RT without complication. Vitals as seen above. Patient given 2mg  of ativan upon arrival to assist in relaxation after transfer to MRI bed. Patient was coughing over the ventilator and appeared to be uncomfortable lying flat. After receiving ativan patient was calm and still for the rest of the procedure.

## 2017-08-14 NOTE — Progress Notes (Signed)
Pharmacy Antibiotic Note  [name] Cruz is a 62 y.o. male admitted on 08/09/2017 with possible aspiration pneumonia with hypotension and fever.  Pharmacy has been consulted for Zosyn dosing.  Plan: Zosyn 3.375g IV q8h (4 hour infusion).  Height: 5\' 2"  (157.5 cm) Weight: 163 lb 9.3 oz (74.2 kg) IBW/kg (Calculated) : 54.6  Temp (24hrs), Avg:100 F (37.8 C), Min:99.7 F (37.6 C), Max:101 F (38.3 C)  Recent Labs  Lab 08/09/17 1312  08/10/17 0315 08/10/17 1242 08/11/17 0418 08/12/17 0411 08/13/17 0339 08/14/17 0310  WBC 7.9  --  14.8*  --   --  7.8 8.3 8.4  CREATININE 0.85   < > 0.86 0.66 0.69 0.65 0.55* 0.59*   < > = values in this interval not displayed.    Estimated Creatinine Clearance: 84.5 mL/min (A) (by C-G formula based on SCr of 0.59 mg/dL (L)).    No Known Allergies  Antimicrobials this admission: Zosyn 8/1 >>   Microbiology results: 8/1 TA: pending 7/28 BCx2: no growth to date 7/27 BCx2: no growth to date 7/27 UA: negative  7/27 MRSA: negative  Thank you for allowing pharmacy to be a part of this patient's care.  Marcelino FreestoneEmily McElhaney, PharmD PGY2 Cardiology Pharmacy Resident Phone [phone] 08/14/2017 12:41 PM

## 2017-08-14 NOTE — Progress Notes (Signed)
Left upper extremity venous duplex has been completed. There is evidence of acute deep vein thrombosis involving the brachial vein of the left upper extremity. There is also evidence of acute superficial vein thrombosis involving the basilic vein of the left upper extremity. Results were given to the patient's nurse, Shanda BumpsJessica.  08/14/17 12:21 PM Olen CordialGreg Momodou Consiglio RVT

## 2017-08-14 NOTE — Progress Notes (Signed)
eLink Physician-Brief Progress Note Patient Name: [name] Cruz DOB: 22-Feb-1955 MRN: [id number]   Date of Service  08/14/2017  HPI/Events of Note  K+ 3.5, Total calcium 7.7  eICU Interventions  Kcl 10 meq Q 1 hr x 3, check ionized calcium this AM        Leanna Hamid U Tyrell Brereton 08/14/2017, 4:21 AM

## 2017-08-14 NOTE — Care Management Note (Addendum)
Case Management Note  Patient Details  Name: [name] Cruz J Keysor MRN: [id number] Date of Birth: [birth year]  Subjective/Objective:   Pt admitted post seizure - arrested after admit              Action/Plan:  Pt independent from home.  Pt remains ventilated.  CM will continue to follow   Expected Discharge Date:                  Expected Discharge Plan:     In-House Referral:  Clinical Social Work  Discharge planning Services  CM Consult  Post Acute Care Choice:    Choice offered to:     DME Arranged:    DME Agency:     HH Arranged:    HH Agency:     Status of Service:     If discussed at [employer] Meetings, dates discussed:    Additional Comments:  Cherylann ParrClaxton, Kishana Battey S, RN 08/14/2017, 4:17 PM

## 2017-08-14 NOTE — Progress Notes (Signed)
RT transported patient to and from MRI on vent with no complications.  Vital signs and O2 sats remained in normal range throughout transport.

## 2017-08-14 NOTE — Progress Notes (Signed)
PHENYTOIN CONSULT NOTE Mr. Reicher was on phenytoin 100 mg TID PTA; however, was taken off by PCP per neurology. Patient presented with seizures and was loaded with phenytoin on 7/27 then restarted on PTA dose of phenytoin.  Hepatic Function Latest Ref Rng & Units 08/11/2017 07/11/2017 05/01/2017  Total Protein 6.5 - 8.1 g/dL 4.8(L) 6.6 6.4  Albumin 3.5 - 5.0 g/dL 2.6(L) 4.3 4.3  AST 15 - 41 U/L 51(H) 27 13  ALT 0 - 44 U/L 41 17 12  Alk Phosphatase 38 - 126 U/L 58 77 94  Total Bilirubin 0.3 - 1.2 mg/dL 0.7 0.2 <0.2  Bilirubin, Direct 0.0 - 0.2 mg/dL 0.3(H) 0.10 0.06    Phenytoin levels: 7/27  Total: < 2.5  7/29  Total: 12.7 (corrected to 20.5) 7/30  Total: 13.8 (corrected to 22)  7/31  Free: 1.7          Total: 11.8 (corrected to 19)   Patient with hypoalbuminemia, phenytoin levels corrected above. LFTs increased, but remain WNL.  Level on 7/30 was drawn shortly after the morning dose of phenytoin was administered, therefore, it is not accurate. Free phenytoin and total phenytoin from 7/31 results therapeutic and is a more accurate representation of therapy. Patient is therapeutic on phenytoin 100 mg IV TID.   Goal of Therapy:  Dilantin level 10-20  Plan:  Continue phenytoin 100 mg tid.   Thank you for allowing pharmacy to participate in this patients care.  Claiborne Billings, PharmD PGY2 Cardiology Pharmacy Resident Phone [phone] 08/14/2017 4:35 PM

## 2017-08-15 ENCOUNTER — Inpatient Hospital Stay (HOSPITAL_COMMUNITY): Payer: Medicare Other

## 2017-08-15 DIAGNOSIS — J189 Pneumonia, unspecified organism: Secondary | ICD-10-CM

## 2017-08-15 DIAGNOSIS — R509 Fever, unspecified: Secondary | ICD-10-CM

## 2017-08-15 LAB — GLUCOSE, CAPILLARY
GLUCOSE-CAPILLARY: 122 mg/dL — AB (ref 70–99)
GLUCOSE-CAPILLARY: 118 mg/dL — AB (ref 70–99)
GLUCOSE-CAPILLARY: 118 mg/dL — AB (ref 70–99)
Glucose-Capillary: 115 mg/dL — ABNORMAL HIGH (ref 70–99)

## 2017-08-15 LAB — CBC WITH DIFFERENTIAL/PLATELET
ABS IMMATURE GRANULOCYTES: 0.1 10*3/uL (ref 0.0–0.1)
BASOS ABS: 0 10*3/uL (ref 0.0–0.1)
BASOS PCT: 1 %
Eosinophils Absolute: 0.2 10*3/uL (ref 0.0–0.7)
Eosinophils Relative: 2 %
HCT: 28.5 % — ABNORMAL LOW (ref 39.0–52.0)
HEMOGLOBIN: 8.8 g/dL — AB (ref 13.0–17.0)
IMMATURE GRANULOCYTES: 1 %
LYMPHS PCT: 19 %
Lymphs Abs: 1.4 10*3/uL (ref 0.7–4.0)
MCH: 30.8 pg (ref 26.0–34.0)
MCHC: 30.9 g/dL (ref 30.0–36.0)
MCV: 99.7 fL (ref 78.0–100.0)
MONOS PCT: 19 %
Monocytes Absolute: 1.4 10*3/uL — ABNORMAL HIGH (ref 0.1–1.0)
Neutro Abs: 4.2 10*3/uL (ref 1.7–7.7)
Neutrophils Relative %: 58 %
PLATELETS: 219 10*3/uL (ref 150–400)
RBC: 2.86 MIL/uL — ABNORMAL LOW (ref 4.22–5.81)
RDW: 16.3 % — ABNORMAL HIGH (ref 11.5–15.5)
WBC: 7.3 10*3/uL (ref 4.0–10.5)

## 2017-08-15 LAB — HEPARIN LEVEL (UNFRACTIONATED)
HEPARIN UNFRACTIONATED: 0.19 [IU]/mL — AB (ref 0.30–0.70)
Heparin Unfractionated: 0.33 IU/mL (ref 0.30–0.70)
Heparin Unfractionated: 0.17 IU/mL — ABNORMAL LOW (ref 0.30–0.70)

## 2017-08-15 LAB — CULTURE, BLOOD (ROUTINE X 2)
CULTURE: NO GROWTH
Special Requests: ADEQUATE

## 2017-08-15 LAB — CALCIUM, IONIZED: CALCIUM, IONIZED, SERUM: 4.6 mg/dL (ref 4.5–5.6)

## 2017-08-15 MED ORDER — HEPARIN BOLUS VIA INFUSION
2000.0000 [IU] | Freq: Once | INTRAVENOUS | Status: AC
  Administered 2017-08-15: 2000 [IU] via INTRAVENOUS
  Filled 2017-08-15: qty 2000

## 2017-08-15 MED ORDER — VANCOMYCIN HCL 10 G IV SOLR: 1500.0000 mg | INTRAVENOUS | Status: DC

## 2017-08-15 MED ORDER — ATORVASTATIN CALCIUM 80 MG PO TABS
80.0000 mg | ORAL_TABLET | Freq: Every day | ORAL | Status: DC
  Administered 2017-08-16 – 2017-08-18 (×2): 80 mg
  Filled 2017-08-15 (×3): qty 1

## 2017-08-15 MED ORDER — VANCOMYCIN HCL IN DEXTROSE 1-5 GM/200ML-% IV SOLN
1000.0000 mg | Freq: Two times a day (BID) | INTRAVENOUS | Status: DC
  Administered 2017-08-15 – 2017-08-17 (×4): 1000 mg via INTRAVENOUS
  Filled 2017-08-15 (×5): qty 200

## 2017-08-15 MED ORDER — LORAZEPAM 2 MG/ML IJ SOLN
1.0000 mg | Freq: Three times a day (TID) | INTRAMUSCULAR | Status: DC
  Administered 2017-08-15: 1 mg via INTRAVENOUS
  Filled 2017-08-15: qty 1

## 2017-08-15 MED ORDER — SENNOSIDES 8.8 MG/5ML PO SYRP
10.0000 mL | ORAL_SOLUTION | Freq: Two times a day (BID) | ORAL | Status: DC | PRN
  Administered 2017-08-15: 10 mL
  Filled 2017-08-15: qty 10

## 2017-08-15 NOTE — Progress Notes (Signed)
Pharmacy Antibiotic Note  Cipriano MileBilly J Hand is a 62 y.o. male admitted on 08/09/2017 with possible aspiration pneumonia with hypotension and fever.  Pharmacy has been consulted for vancomycin dosing.  Plan: Zosyn 3.375g IV q8h (4 hour infusion).  Vancomycin 1000 mg IV q12h   Height: 5\' 2"  (157.5 cm) Weight: 167 lb 8.8 oz (76 kg) IBW/kg (Calculated) : 54.6  Temp (24hrs), Avg:100.5 F (38.1 C), Min:99.6 F (37.6 C), Max:101.2 F (38.4 C)  Recent Labs  Lab 08/10/17 0315 08/10/17 1242 08/11/17 0418 08/12/17 0411 08/13/17 0339 08/14/17 0310 08/15/17 0507  WBC 14.8*  --   --  7.8 8.3 8.4 7.3  CREATININE 0.86 0.66 0.69 0.65 0.55* 0.59*  --     Estimated Creatinine Clearance: 85.6 mL/min (A) (by C-G formula based on SCr of 0.59 mg/dL (L)).    No Known Allergies  Antimicrobials this admission: Zosyn 8/1 >>  Vancomycin 8/2 >>  Microbiology results: 8/1 TA: abundant S aureus  7/28 BCx2: no growth to date 7/27 BCx2: no growth to date 7/27 UA: negative  7/27 MRSA: negative  Thank you for allowing pharmacy to be a part of this patient's care.  Marcelino FreestoneEmily Edge Mauger, PharmD PGY2 Cardiology Pharmacy Resident Phone [phone] 08/15/2017 2:02 PM

## 2017-08-15 NOTE — Progress Notes (Signed)
ANTICOAGULATION CONSULT NOTE   Pharmacy Consult for Heparin Indication: DVT  No Known Allergies  Patient Measurements: Height: '5\' 2"'$  (157.5 cm) Weight: 167 lb 8.8 oz (76 kg) IBW/kg (Calculated) : 54.6 HEPARIN DW (KG): 70.9   Vital Signs: Temp: 99.8 F (37.7 C) (08/02 0400) Temp Source: Axillary (08/02 0400) BP: 106/72 (08/02 0500) Pulse Rate: 89 (08/02 0500)  Labs: Recent Labs    08/12/17 0813  08/13/17 0339 08/14/17 0310 08/14/17 2032 08/15/17 0507  HGB  --    < > 9.8* 9.6*  --  8.8*  HCT  --   --  30.7* 31.4*  --  28.5*  PLT  --   --  153 187  --  219  HEPARINUNFRC  --   --   --   --  <0.10* 0.33  CREATININE  --   --  0.55* 0.59*  --   --   TROPONINI 0.05*  --   --   --   --   --    < > = values in this interval not displayed.    Estimated Creatinine Clearance: 85.6 mL/min (A) (by C-G formula based on SCr of 0.59 mg/dL (L)).   Medical History: Past Medical History:  Diagnosis Date  . Cognitive impairment 2007   after stroke, saw rehab but told to stop because was too upsetting to him  . History of chicken pox   . HLD (hyperlipidemia)   . HTN (hypertension)   . NSTEMI (non-ST elevated myocardial infarction) ([place]) 02/21/2017  . Obesity   . Stroke, hemorrhagic ([place]) 2007   thought 2/2 HTN (240sbp); residual cognitive impairment, loss of R peripheral field, no driving    Medications:  Scheduled:  . atorvastatin  80 mg Oral Daily  . chlorhexidine gluconate (MEDLINE KIT)  15 mL Mouth Rinse BID  . Chlorhexidine Gluconate Cloth  6 each Topical Daily  . famotidine  20 mg Oral QHS  . furosemide  20 mg Intravenous Daily  . insulin aspart  0-9 Units Subcutaneous Q4H  . LORazepam  2 mg Intravenous Q8H  . mouth rinse  15 mL Mouth Rinse 10 times per day  . phenytoin (DILANTIN) IV  100 mg Intravenous Q8H  . sodium chloride flush  10-40 mL Intracatheter Q12H   Infusions:  . feeding supplement (VITAL AF 1.2 CAL) 1,500 mL (08/14/17 1525)  . heparin 1,350 Units/hr  (08/15/17 0518)  . lacosamide (VIMPAT) IV Stopped (08/14/17 2147)  . levETIRAcetam Stopped (08/15/17 0502)  . piperacillin-tazobactam (ZOSYN)  IV 12.5 mL/hr at 08/15/17 0518    Assessment: Pt found to have new DVT in left upper arm. Pharmacy consulted to manage heparin therapy. Hemoglobin low, but stable; pltc increasing. No bleeding noted.    Heparin level therapeutic x 1 after rate increase  Goal of Therapy:  Heparin level 0.3-0.7 units/ml Monitor platelets by anticoagulation protocol: Yes   Plan:  -Cont heparin 1350 units/hr -1200 HL  Narda Bonds, PharmD, BCPS Clinical Pharmacist Phone: [phone]

## 2017-08-15 NOTE — Progress Notes (Signed)
Progress Note  Patient Name: [name] Cruz Date of Encounter: 08/15/2017  Primary Cardiologist:   Larae Grooms, MD   Subjective   Opens eyes.  No purposeful movement..  Evidence of left upper extremity DVT is noted.    Inpatient Medications    Scheduled Meds: . atorvastatin  80 mg Oral Daily  . chlorhexidine gluconate (MEDLINE KIT)  15 mL Mouth Rinse BID  . Chlorhexidine Gluconate Cloth  6 each Topical Daily  . famotidine  20 mg Oral QHS  . furosemide  20 mg Intravenous Daily  . insulin aspart  0-9 Units Subcutaneous Q4H  . LORazepam  2 mg Intravenous Q8H  . mouth rinse  15 mL Mouth Rinse 10 times per day  . phenytoin (DILANTIN) IV  100 mg Intravenous Q8H  . sodium chloride flush  10-40 mL Intracatheter Q12H   Continuous Infusions: . feeding supplement (VITAL AF 1.2 CAL) 1,500 mL (08/14/17 1525)  . heparin 1,350 Units/hr (08/15/17 0518)  . lacosamide (VIMPAT) IV Stopped (08/14/17 2147)  . levETIRAcetam Stopped (08/15/17 0502)  . piperacillin-tazobactam (ZOSYN)  IV 12.5 mL/hr at 08/15/17 0518   PRN Meds: acetaminophen (TYLENOL) oral liquid 160 mg/5 mL, albuterol, bisacodyl, fentaNYL (SUBLIMAZE) injection, hydrALAZINE, LORazepam, sodium chloride flush   Vital Signs    Vitals:   08/15/17 0500 08/15/17 0600 08/15/17 0700 08/15/17 0722  BP: 106/72 92/68 (!) 94/58   Pulse: 89 98 91   Resp: 18 (!) 21 19   Temp:    (!) 100.9 F (38.3 C)  TempSrc:    Axillary  SpO2: 99% 99% 99%   Weight:      Height:        Intake/Output Summary (Last 24 hours) at 08/15/2017 0749 Last data filed at 08/15/2017 0518 Gross per 24 hour  Intake 2802.94 ml  Output 1485 ml  Net 1317.94 ml   Filed Weights   08/13/17 0500 08/14/17 0455 08/15/17 0415  Weight: 169 lb 12.1 oz (77 kg) 163 lb 9.3 oz (74.2 kg) 167 lb 8.8 oz (76 kg)    Telemetry    NSR, PVCs, artifact  ECG    Pending  Physical Exam   GEN: No  acute distress.   Neck: No  JVD Cardiac: RRR, no murmurs, rubs, or  gallops.  Respiratory: Clear   to auscultation bilaterally. GI: Soft, nontender, non-distended, normal bowel sounds  MS:  Mild diffuse edema; No deformity. Neuro:   Opens eyes and moves feet   Labs    Chemistry Recent Labs  Lab 08/11/17 0418 08/12/17 0411 08/13/17 0339 08/14/17 0310  NA 142 141 143 144  K 3.3* 3.2* 3.8 3.5  CL 110 112* 106 106  CO2 25 21* 26 28  GLUCOSE 108* 113* 141* 146*  BUN <5* 5* 10 13  CREATININE 0.69 0.65 0.55* 0.59*  CALCIUM 7.5* 7.7* 7.7* 8.0*  PROT 4.8*  --   --   --   ALBUMIN 2.6*  --   --   --   AST 51*  --   --   --   ALT 41  --   --   --   ALKPHOS 58  --   --   --   BILITOT 0.7  --   --   --   GFRNONAA >60 >60 >60 >60  GFRAA >60 >60 >60 >60  ANIONGAP '7 8 11 10     '$ Hematology Recent Labs  Lab 08/13/17 0339 08/14/17 0310 08/15/17 0507  WBC 8.3 8.4 7.3  RBC 3.11* 3.16* 2.86*  HGB 9.8* 9.6* 8.8*  HCT 30.7* 31.4* 28.5*  MCV 98.7 99.4 99.7  MCH 31.5 30.4 30.8  MCHC 31.9 30.6 30.9  RDW 16.4* 16.5* 16.3*  PLT 153 187 219    Cardiac Enzymes Recent Labs  Lab 08/09/17 2115 08/10/17 0315 08/10/17 0920 08/12/17 0813  TROPONINI 0.28* 0.28* 0.15* 0.05*   No results for input(s): TROPIPOC in the last 168 hours.   BNPNo results for input(s): BNP, PROBNP in the last 168 hours.   DDimer No results for input(s): DDIMER in the last 168 hours.   Radiology    Mr Brain Wo Contrast  Result Date: 08/14/2017 CLINICAL DATA:  Initial evaluation status post cardiac arrest. Inciting event likely seizure. History of prior hemorrhagic stroke. EXAM: MRI HEAD WITHOUT CONTRAST TECHNIQUE: Multiplanar, multiecho pulse sequences of the brain and surrounding structures were obtained without intravenous contrast. COMPARISON:  Prior CT from 08/09/2017 as well as previous MRI from 04/27/12. FINDINGS: Brain: Generalized age-related cerebral atrophy. Confluent T2/FLAIR hyperintensity within the periventricular deep white matter both cerebral hemispheres most  consistent with chronic small vessel ischemic change, moderate nature. Superimposed small remote lacunar infarcts at the right basal ganglia and thalamus. Chronic encephalomalacia at the left periatrial region and left temporal lobe with associated chronic hemosiderin staining, consistent with prior hemorrhagic infarction in this region. Associated ex vacuo dilatation of the left lateral ventricle, similar to previous. Multiple DISH in ule subcentimeter chronic micro hemorrhages seen involving the supratentorial and infratentorial brain, suspected to be related to chronic underlying hypertension. These appear progressed as compared to previous study. Single punctate 6 mm focus of diffusion abnormality at the cortical/subcortical region of the right parietal lobe (series 5, image 61), suspicious for a small acute ischemic infarct (series 5, image 61). No associated hemorrhage. No other evidence for acute or subacute infarction. Fairly symmetric increased FLAIR signal abnormality with associated diffusion signal seen involving the mesial temporal lobes/hippocampi bilaterally, likely reflecting sequelae of seizure (series 7, image 49). No mass lesion. No midline shift or mass effect. Ventricular prominence related to global parenchymal volume loss and ex vacuo dilatation without hydrocephalus. No extra-axial fluid collection. Pituitary gland within normal limits. Vascular: Major intracranial vascular flow voids maintained Skull and upper cervical spine: Craniocervical junction normal. Bone marrow signal intensity diffusely decreased on T1 weighted sequence within the upper cervical spine, most commonly related to anemia, smoking, or obesity. No focal marrow replacing lesion. No scalp soft tissue abnormality. Sinuses/Orbits: Globes and orbital soft tissues demonstrate no acute finding. Fluid seen layering within the nasopharynx. Patient is intubated. Bilateral mastoid effusions noted. Other: None. IMPRESSION: 1.  Symmetric increased FLAIR and diffusion signal abnormality involving the mesial temporal lobes/hippocampi bilaterally. Given history, changes most likely reflect sequelae of seizure. 2. 6 mm acute ischemic right parietal cortical/subcortical infarct. 3. Sequelae of remote hemorrhagic infarction at the left periatrial/temporal region, stable. 4. Multiple additional chronic micro hemorrhages scattered throughout the brain, likely related to chronic underlying hypertension. These appear progressed relative to 2014. 5. Atrophy with moderate chronic small vessel ischemic disease. Electronically Signed   By: Jeannine Boga M.D.   On: 08/14/2017 02:12    Cardiac Studies   Echo:    Study Conclusions  - Left ventricle: The cavity size was normal. There was mild focal   basal hypertrophy of the septum. Systolic function was mildly   reduced. The estimated ejection fraction was in the range of 45%   to 50%. Mild diffuse hypokinesis. The study is not  technically   sufficient to allow evaluation of LV diastolic function. - Aortic valve: Moderately to severely calcified annulus.   Trileaflet; normal thickness, moderately calcified leaflets.   There was mild regurgitation. - Pulmonary arteries: PA peak pressure: 32 mm Hg (S). - Inferior vena cava: The vessel was mildly dilated.   Patient Profile     62 y.o. male with history ofhemorrhagic stroke (thought to be secondary to uncontrolled hypertension now with residual cognitive impairment), hypertension, hyperlipidemia, CAD and atrial fib.  He had atrial fib in Feb of this year.  He had an NSTEMI and was found to have 3 vessel CAD.  He had CABG.  He had a mildy reduced EF of 45% at that time.   He was admitted now with altered mentation and a seizure.  We were called after he had ventricular tachycardia and required CPR in the CT scanner.  He has a markedly abnormal EKG.    Assessment & Plan    Ventricular tachycardia:   No further events.    Suggest restart ASA.   Unable to restart beta blocker as the BP fluctuates.    Abnormal EKG:   Repeat EKG  We will see again on Monday as needed.    For questions or updates, please contact [place] Please consult [email or web address] for contact info under Cardiology/STEMI.   Signed, Minus Breeding, MD  08/15/2017, 7:49 AM

## 2017-08-15 NOTE — Progress Notes (Signed)
Pt transported to 4N29 via ICU hospital bed with RRT without incident. Receiving RN Junious Dresser(Connie) present.

## 2017-08-15 NOTE — Progress Notes (Signed)
ANTICOAGULATION CONSULT NOTE   Pharmacy Consult for Heparin Indication: DVT  No Known Allergies  Patient Measurements: Height: 5' 2" (157.5 cm) Weight: 167 lb 8.8 oz (76 kg) IBW/kg (Calculated) : 54.6 HEPARIN DW (KG): 70.9   Vital Signs: Temp: 100.3 F (37.9 C) (08/02 1140) Temp Source: Axillary (08/02 1140) BP: 125/77 (08/02 1100) Pulse Rate: 94 (08/02 1100)  Labs: Recent Labs    08/13/17 0339 08/14/17 0310 08/14/17 2032 08/15/17 0507 08/15/17 1125  HGB 9.8* 9.6*  --  8.8*  --   HCT 30.7* 31.4*  --  28.5*  --   PLT 153 187  --  219  --   HEPARINUNFRC  --   --  <0.10* 0.33 0.17*  CREATININE 0.55* 0.59*  --   --   --     Estimated Creatinine Clearance: 85.6 mL/min (A) (by C-G formula based on SCr of 0.59 mg/dL (L)).   Medical History: Past Medical History:  Diagnosis Date  . Cognitive impairment 2007   after stroke, saw rehab but told to stop because was too upsetting to him  . History of chicken pox   . HLD (hyperlipidemia)   . HTN (hypertension)   . NSTEMI (non-ST elevated myocardial infarction) (HCC) 02/21/2017  . Obesity   . Stroke, hemorrhagic (HCC) 2007   thought 2/2 HTN (240sbp); residual cognitive impairment, loss of R peripheral field, no driving    Medications:  Scheduled:  . atorvastatin  80 mg Oral Daily  . chlorhexidine gluconate (MEDLINE KIT)  15 mL Mouth Rinse BID  . Chlorhexidine Gluconate Cloth  6 each Topical Daily  . famotidine  20 mg Oral QHS  . furosemide  20 mg Intravenous Daily  . insulin aspart  0-9 Units Subcutaneous Q4H  . LORazepam  2 mg Intravenous Q8H  . mouth rinse  15 mL Mouth Rinse 10 times per day  . phenytoin (DILANTIN) IV  100 mg Intravenous Q8H  . sodium chloride flush  10-40 mL Intracatheter Q12H   Infusions:  . feeding supplement (VITAL AF 1.2 CAL) 1,500 mL (08/14/17 1525)  . heparin 1,350 Units/hr (08/15/17 1100)  . lacosamide (VIMPAT) IV 200 mg (08/15/17 1041)  . levETIRAcetam Stopped (08/15/17 0502)  .  piperacillin-tazobactam (ZOSYN)  IV Stopped (08/15/17 0543)    Assessment: Pt found to have new DVT in left upper arm. Pharmacy consulted to manage heparin therapy. Hemoglobin decreased, possibly dilutional; pltc increasing. No current bleeding noted. Per nursing, line has been leaking and has not been fully administering heparin.    Heparin level subtherapeutic at 0.17 on heparin 1250 units/hr.  Goal of Therapy:  Heparin level 0.3-0.7 units/ml Monitor platelets by anticoagulation protocol: Yes   Plan:  Nursing reports another IV line is available for heparin infusion. Will continue current rate of heparin 1250 units/hr at new site as previously therapeutic on dose. Recheck heparin level in 6 hours.    , PharmD PGY2 Cardiology Pharmacy Resident Phone [phone] 08/15/2017 12:25 PM      

## 2017-08-15 NOTE — Progress Notes (Signed)
PULMONARY / CRITICAL CARE MEDICINE  Name: [name] Cruz MRN: [id number] DOB: 1955-05-03    ADMISSION DATE:  08/09/2017 CONSULTATION DATE:  08/09/17  REFERRING MD:  Dr. Fredderick Phenix / EDP   CHIEF COMPLAINT:  Seizure   BRIEF SUMMARY:   62 y/o M who [facility] on 7/[facility] via EMS with reports of seizures.    Per patients son, he was in line at [employer] and then had garbled speech, spun around and fell to the ground with seizure like activity.  EMS reported the patient was post-ictal on arrival to scene.  PMH of large hemorrhagic CVA in 2007 with residual speech difficulties.  The patient was alert/oriented to self & place on arrival to ER.  While in the room, he was witnessed to have altered speech and couldn't tell the staff where he was etc. He then had a seizure and was treated with 1mg  IV ativan.  He relaxed after about 30 seconds.     He was sent for CT head and while in the scanner he had an episode of ventricular tachycardia with arrest.  CPR was initiated and he was returned to the ER.  By the time he arrived to the ER, he was in a narrow complex rhythm without a pulse.  He was not shocked.  CPR total time ~ 10 minutes. Pt treated with amiodarone, EPI x2 and intubated without drugs. During intubation, he seized another time. Initial labs-Na 132, K 3.9, CL 99, CO2 22, glucose 206, BUN 10 / sr cr 0.85, Mg 1.8, WBC 7.9, Hgb 11.2 and platelets 231.  CXR pending.  CT of the head 7/27 showed no acute abnormality, moderate chronic small vessel disease, stable chronic ventriculomegaly compatible with left temporal occipital encephalomalacia / central volume loss. PCCM called for ICU admission.  Of note, on medication review, the patient last filled his phenytoin on 03/27/17 (takes 100mg  TID) for a three month supply.  Raises question of running out of meds?? Family thinks he had been out for a "little while".  Admitted for hypothermia protocol but patient was purposeful on arrival to ICU, discontinued.   Deep sedation initiated per Neuro for burst suppression.  7/30 - removed continuous sedation, off propofol and versed  8/1 - awaiting mental status to clear to attempt SBT   8/2 -mental status slightly improved, opens eyes to voice  SUBJECTIVE:  No issues overnight.  Blood pressure has been stable.  Remains with low-grade fevers.  Sternal antibiotics yesterday cultures pending.  Family updated at bedside.  Care management plan discussed with nursing at bedside.  VITAL SIGNS: BP 125/77   Pulse 94   Temp 100.3 F (37.9 C) (Axillary)   Resp 18   Ht 5\' 2"  (1.575 m)   Wt 167 lb 8.8 oz (76 kg)   SpO2 99%   BMI 30.65 kg/m   HEMODYNAMICS:    VENTILATOR SETTINGS: Vent Mode: PSV;CPAP FiO2 (%):  [30 %] 30 % Set Rate:  [18 bmp] 18 bmp Vt Set:  [440 mL] 440 mL PEEP:  [5 cmH20] 5 cmH20 Pressure Support:  [5 cmH20-10 cmH20] 5 cmH20 Plateau Pressure:  [12 cmH20-14 cmH20] 14 cmH20  INTAKE / OUTPUT: I/O last 3 completed shifts: In: 3801.5 [I.V.:229.7; Other:515; WU/JW:1191; IV Piggyback:626.7] Out: 2674 [Urine:2674]  PHYSICAL EXAMINATION: General: Elderly male, intubated, unresponsive on mechanical ventilation  HEENT: Mucous memories moist, sclera clear, NCAT Neuro: Opens eyes to voice, does not follow commands CV: Regular rate and rhythm, S1-S2, no murmurs gallops PULM: No  crackles, no wheeze, bilateral rhonchi GI: Soft, nontender, nondistended, bowel sounds present Extremities: Swelling of the left upper extremity as compared to the right, dependent edema. Skin: Bruising status post PIVs  LABS:  BMET Recent Labs  Lab 08/12/17 0411 08/13/17 0339 08/14/17 0310  NA 141 143 144  K 3.2* 3.8 3.5  CL 112* 106 106  CO2 21* 26 28  BUN 5* 10 13  CREATININE 0.65 0.55* 0.59*  GLUCOSE 113* 141* 146*    Electrolytes Recent Labs  Lab 08/09/17 1339  08/10/17 0315  08/12/17 0411 08/13/17 0339 08/14/17 0310  CALCIUM  --    < > 7.5*   < > 7.7* 7.7* 8.0*  MG 1.8  --  1.8  --    --   --  2.1  PHOS  --   --  3.0  --   --   --   --    < > = values in this interval not displayed.    CBC Recent Labs  Lab 08/13/17 0339 08/14/17 0310 08/15/17 0507  WBC 8.3 8.4 7.3  HGB 9.8* 9.6* 8.8*  HCT 30.7* 31.4* 28.5*  PLT 153 187 219    Coag's Recent Labs  Lab 08/09/17 1339  APTT 28  INR 1.04    ABG Recent Labs  Lab 08/09/17 1734 08/10/17 0506 08/12/17 0631  PHART 7.225* 7.462* 7.365  PCO2ART 52.7* 29.1* 39.2  PO2ART 79.0* 204* 89.0    Cardiac Enzymes Recent Labs  Lab 08/10/17 0315 08/10/17 0920 08/12/17 0813  TROPONINI 0.28* 0.15* 0.05*    Glucose Recent Labs  Lab 08/14/17 1527 08/14/17 1955 08/14/17 2344 08/15/17 0343 08/15/17 0724 08/15/17 1145  GLUCAP 122* 112* 142* 122* 115* 118*    Imaging No results found.  -New chest x-ray pending.  STUDIES:  CT Head 7/27 >> no acute abnormality, moderate chronic small vessel disease, stable chronic ventriculomegaly compatible with left temporal occipital encephalomalacia / central volume loss.  ECHO 7/27 >> LV normal size, mild focal basal hypertrophy of the septum, mildly reduced systolic function, LVEF 45-50%, AV moderately to severely calcified annulus, mild AR, PA peak 32 mmHg, IVC mildly dilated.  EEG 7/29 >> intermittent burst suppression EEG 7/30 >> burst suppression  MRI Brain 7/31 >> chronic ischemic changes, FLAIR imaging consistent with status CXR 8/2  >>> pending   CULTURES: BCx2 7/27 >> no growth to date UA 7/27 >> negative  Trach aspirate 08/14/2017>> gram-positive cocci in clusters, gram-negative rods.   SIGNIFICANT EVENTS: 7/27  Admit to [facility] with seizure, suffered VT arrest in CT.  Had some purposeful movement after arrival to ICU, cooling protocol discontinued  7/28 EKG with new t-wave inversion 7/29 Remains on propofol, versed for burst suppression.  Requiring levo due to sedation.  Amiodarone stopped, Cards consulted 7/30  QTc improved, burst suppression being lifted.   Remains on 5mg /hr versed 7/31  EEG stopped, versed stopped at 1145 am.  MRI brain.   LINES/TUBES: ETT 7/27 >> R IJ TLC 7/27 >>  L Radial ALine 7/27 >> 7/31  DISCUSSION: 62 y/o M admitted after seizure.  While in CT, he suffered a cardiac arrest with suspected VT as initial rhythm.  Arrest thought to be ~ 10 minutes.  He was transferred back to the ER with CPR in progress from the CT scanner and on arrival had a narrow complex rhythm with no pulse. Intubated without drugs.  Seized during intubation.  Initially was to undergo hypothermia protocol but after arriving to ICU from ER,  was thought to be purposeful and hypothermia protocol discontinued.  He has had continuous EEG with persistent seizures despite propofol/versed.  Suppression achieved 7/29. Propofol off 7/30. Versed gtt stopped 7/31.  8/1, 8/2 in pressure support, neuro status preventing attempts at extubation, new fever, new Left UE DVT, on heparin, started Abx cultures pending   ASSESSMENT / PLAN:  Acute hypoxemic respiratory failure, intubated on mechanical ventilation in setting of status epilepticus to maintain airway patency.  Possible aspiration pneumonia secondary to events surrounding cardiac arrest.  Respiratory cultures growing gram-positive cocci as well as gram-negative rods, and gram-negative diplococci.  Small right Pleural Effusion  He is continue to tolerate pressure support.  His lung mechanics look good on the ventilator.  His neurologic status is preventing extubation at this point. Continue to wean the ventilator.,  Maintain SPO2 88 to 90%. Piperacillin tazobactam was started yesterday this needs to be continued until cultures finalize We will start vancomycin due to GPC's in sputum we will de-escalate antibiotics once cultures finalized. I have ordered a repeat chest x-ray to look for any developing infiltrate or worsening of effusion.  In-hospital cardiac Arrest, VT then to PEA arrest Elevated troponin,  NSTEMI Prolonged QTc, New T-wave inversion, secondary to status epilepticus also on amiodarone at the time Hx CAD s/p CABG 03/2017, HTN, HLD Acute on chronic systolic heart failure, EF 40 to 45% ICU for closer monitoring monitoring Continue losartan, Coreg, atorvastatin hold Zetia Monitor changes in QT on telemetry Diuresis to maintain euvolemia  Hyponatremia  Hypokalemia  Acute urinary retention requiring in and out catheterization  Follow BMP follow urine output Replace electrolytes as needed  At Risk Malnutrition  On tube feeds Continue tube feeds at goal Switched PPI to H2 blocker  Mild Anemia, likely ICU acquired, multifactorial We will follow with CBC Heparin and SCDs for DVT PPX  Possible aspiration pneumonia P:   We will continue to monitor fever curve as well as white blood cell count Tracheal aspirate sent on 08/14/2017  Hyperglycemia - mild, suspect stress response    SSI  Status epilepticus, secondary to medication noncompliance Chronic microvascular disease of the brain is seen on MRI 6 mm right parietal, cortical/subcortical infarct, CVA Heavy ETOH Use  RASS goal 0 to -1 Continue Vimpat, Keppra, Dilantin as well as scheduled Ativan per neurology recommendations We will decrease scheduled Ativan to 1 mg every 8 hours per their recommendations. They would like to taper the benzodiazepine slowly  Acute left upper extremity DVT Continue heparin Consult pharmacy for heparin dosing, following PDT Anticoagulations discussed with neurology prior to initiation  Acute ischemic stroke Chronic hemorrhagic strokes per MRI findings -Continue statin -Okay to use heparin per neurology  Family - Son Genevie Cheshire([place]) updated at bedside in detail 7/31 by Ms. Veleta Minersllis, NP for critical care  The patient is critically ill with multiple organ systems failure and requires high complexity decision making for assessment and support, frequent evaluation and titration of therapies,  application of advanced monitoring technologies and extensive interpretation of multiple databases. Critical Care Time devoted to patient care services described in this note is 35 minutes.   Josephine IgoBradley L Courteny Egler, DO [facility] 08/15/2017 1:02 PM  Pager: [phone]

## 2017-08-15 NOTE — Progress Notes (Signed)
ANTICOAGULATION CONSULT NOTE - Follow Up Consult  Pharmacy Consult for Heparin Indication: DVT  No Known Allergies  Patient Measurements: Height: 5\' 2"  (157.5 cm) Weight: 167 lb 8.8 oz (76 kg) IBW/kg (Calculated) : 54.6 Heparin Dosing Weight:  70.9  Vital Signs: Temp: 100.3 F (37.9 C) (08/02 1611) Temp Source: Axillary (08/02 1611) BP: 113/70 (08/02 2000) Pulse Rate: 104 (08/02 1924)  Labs: Recent Labs    08/13/17 0339 08/14/17 0310  08/15/17 0507 08/15/17 1125 08/15/17 1934  HGB 9.8* 9.6*  --  8.8*  --   --   HCT 30.7* 31.4*  --  28.5*  --   --   PLT 153 187  --  219  --   --   HEPARINUNFRC  --   --    < > 0.33 0.17* 0.19*  CREATININE 0.55* 0.59*  --   --   --   --    < > = values in this interval not displayed.    Estimated Creatinine Clearance: 85.6 mL/min (A) (by C-G formula based on SCr of 0.59 mg/dL (L)).   Assessment:  Anticoag: Pt found to have new DVT in left upper arm. Hgb 9.8>9.6>8.8. Heparin reportedly leaking earlier today. Repeat HL low 0.19. No further leaking noted by 2nd shift RN  Goal of Therapy:  Heparin level 0.3-0.7 units/ml Monitor platelets by anticoagulation protocol: Yes   Plan:  Bolus IV heparin 2000 units and Increase IV heparin to 1550 units/hr. Daily HL and CBC  Hanna Aultman S. Merilynn Finlandobertson, PharmD, BCPS Clinical Staff Pharmacist Pager [phone]  Misty Stanleyobertson, Ikechukwu Cerny Stillinger 08/15/2017,8:50 PM

## 2017-08-15 NOTE — Progress Notes (Signed)
Went to change the dressing on patient's RIJ cvc with another nurse. Dressing was mostly off and saturated from continuous secretions, CVC appeared to be out about 2-3 inches. Called [employer] at [phone], Diplomatic Services operational officersecretary to relay message to MD, still waiting on response. Patient started coughing and line came out a little bit more. Line also started to ooze blood since patient is on Heparin Gtt, cleaned site and applied gauze dressing. Left line in place until response from MD.  Dorcas CarrowKaitlyn E Daelyn Pettaway

## 2017-08-15 NOTE — Progress Notes (Addendum)
NEURO HOSPITALIST PROGRESS NOTE   Subjective: Patient in bed intubated. No versed or propofol. Breathing at 22 BPM, NAD, SAT > 92%.  Exam: Vitals:   08/15/17 0722 08/15/17 0808  BP:    Pulse:  (!) 104  Resp:  (!) 26  Temp: (!) 100.9 F (38.3 C)   SpO2:  100%    Physical Exam   HEENT-  Normocephalic, no lesions, without obvious abnormality.  Normal external eye and conjunctiva.   Cardiovascular- S1-S2 audible, pulses palpable throughout   Lungs-no rhonchi or wheezing noted, no excessive working breathing.  Saturations within normal limits patient intubated. Abdomen- BS present in all 4 quadrants. Extremities- Warm, dry and intact Musculoskeletal-no joint tenderness, deformity or swelling Skin-warm and dry,intact   Neuro:  Mental Status: No spontaneous limb movement. Attempted to respond to name calling. Cranial Nerves: Pupils 53m equally round and reactive. Does not blink to threat. Corneal reflex intact. Face symmetric.weak cough present. Strong gag present. Motor/ Sensory: BUE does not localize or withdraw to noxious stimuli. BLE does localize and weakly withdraw to noxious stimuli. Moved right more than he moved left. Tone decreased.    Medications:  Scheduled: . atorvastatin  80 mg Oral Daily  . chlorhexidine gluconate (MEDLINE KIT)  15 mL Mouth Rinse BID  . Chlorhexidine Gluconate Cloth  6 each Topical Daily  . famotidine  20 mg Oral QHS  . furosemide  20 mg Intravenous Daily  . insulin aspart  0-9 Units Subcutaneous Q4H  . LORazepam  2 mg Intravenous Q8H  . mouth rinse  15 mL Mouth Rinse 10 times per day  . phenytoin (DILANTIN) IV  100 mg Intravenous Q8H  . sodium chloride flush  10-40 mL Intracatheter Q12H   Continuous: . feeding supplement (VITAL AF 1.2 CAL) 1,500 mL (08/14/17 1525)  . heparin 1,350 Units/hr (08/15/17 0518)  . lacosamide (VIMPAT) IV Stopped (08/14/17 2147)  . levETIRAcetam Stopped (08/15/17 0502)  .  piperacillin-tazobactam (ZOSYN)  IV 12.5 mL/hr at 08/15/17 0518   PLKT:GYBWLSLHTDSKA(TYLENOL) oral liquid 160 mg/5 mL, albuterol, bisacodyl, fentaNYL (SUBLIMAZE) injection, hydrALAZINE, LORazepam, sodium chloride flush  Pertinent Labs/Diagnostics:   Mr Brain Wo Contrast  Result Date: 08/14/2017 CLINICAL DATA:  Initial evaluation status post cardiac arrest. Inciting event likely seizure. History of prior hemorrhagic stroke. EXAM: MRI HEAD WITHOUT CONTRAST TECHNIQUE: Multiplanar, multiecho pulse sequences of the brain and surrounding structures were obtained without intravenous contrast. COMPARISON:  Prior CT from 08/09/2017 as well as previous MRI from 04/27/12. FINDINGS: Brain: Generalized age-related cerebral atrophy. Confluent T2/FLAIR hyperintensity within the periventricular deep white matter both cerebral hemispheres most consistent with chronic small vessel ischemic change, moderate nature. Superimposed small remote lacunar infarcts at the right basal ganglia and thalamus. Chronic encephalomalacia at the left periatrial region and left temporal lobe with associated chronic hemosiderin staining, consistent with prior hemorrhagic infarction in this region. Associated ex vacuo dilatation of the left lateral ventricle, similar to previous. Multiple DISH in ule subcentimeter chronic micro hemorrhages seen involving the supratentorial and infratentorial brain, suspected to be related to chronic underlying hypertension. These appear progressed as compared to previous study. Single punctate 6 mm focus of diffusion abnormality at the cortical/subcortical region of the right parietal lobe (series 5, image 61), suspicious for a small acute ischemic infarct (series 5, image 61). No associated hemorrhage. No other evidence for acute or subacute infarction. Fairly symmetric increased FLAIR  signal abnormality with associated diffusion signal seen involving the mesial temporal lobes/hippocampi bilaterally, likely  reflecting sequelae of seizure (series 7, image 49). No mass lesion. No midline shift or mass effect. Ventricular prominence related to global parenchymal volume loss and ex vacuo dilatation without hydrocephalus. No extra-axial fluid collection. Pituitary gland within normal limits. Vascular: Major intracranial vascular flow voids maintained Skull and upper cervical spine: Craniocervical junction normal. Bone marrow signal intensity diffusely decreased on T1 weighted sequence within the upper cervical spine, most commonly related to anemia, smoking, or obesity. No focal marrow replacing lesion. No scalp soft tissue abnormality. Sinuses/Orbits: Globes and orbital soft tissues demonstrate no acute finding. Fluid seen layering within the nasopharynx. Patient is intubated. Bilateral mastoid effusions noted. Other: None. IMPRESSION: 1. Symmetric increased FLAIR and diffusion signal abnormality involving the mesial temporal lobes/hippocampi bilaterally. Given history, changes most likely reflect sequelae of seizure. 2. 6 mm acute ischemic right parietal cortical/subcortical infarct. 3. Sequelae of remote hemorrhagic infarction at the left periatrial/temporal region, stable. 4. Multiple additional chronic micro hemorrhages scattered throughout the brain, likely related to chronic underlying hypertension. These appear progressed relative to 2014. 5. Atrophy with moderate chronic small vessel ischemic disease. Electronically Signed   By: Jeannine Boga M.D.   On: 08/14/2017 02:12    Impression: Status epilepticus in the setting of seizure disorder following hemorrhagic stroke, EtOH abuse and recently having been taken off Dilantin.  1. Electrographic seizures resolved. No clinical seizure activity on exam. Patient no longer in burst suppression after discontinuation of propofol. He is now in a vegetative state.  2. Covered with Dilantin at 100 mg q8h IV.Also on Keppra 1000 mg BID and Vimpat 200 mg BID.Patient  off versed. 3. Status post cardiac arrest. Per Cardiology, the initial event in the grocery store appears most likely to be related to seizure.   Recommendations:  1.Receiving scheduled IV Ativan for benzodiazepine withdrawal at 2 mg IV q8h. Subsequently, will need to taper him slowly off Ativan. .  2. Continue Dilantin at 100 mg q8h. 3. Continue Keppra 1000 mg BID and Vimpat 200 mg BID. 4. Extubate when able to tolerate.    Laurey Morale, MSN, NP-C [facility] [phone]  35 minutes spent in the neurological evaluation and management of this critically ill patient.   Electronically signed: Dr. Kerney Elbe 08/15/2017, 8:36 AM

## 2017-08-16 LAB — CBC WITH DIFFERENTIAL/PLATELET
ABS IMMATURE GRANULOCYTES: 0.1 10*3/uL (ref 0.0–0.1)
BASOS ABS: 0 10*3/uL (ref 0.0–0.1)
Basophils Relative: 0 %
Eosinophils Absolute: 0.2 10*3/uL (ref 0.0–0.7)
Eosinophils Relative: 2 %
HCT: 30.5 % — ABNORMAL LOW (ref 39.0–52.0)
HEMOGLOBIN: 9.6 g/dL — AB (ref 13.0–17.0)
IMMATURE GRANULOCYTES: 2 %
LYMPHS ABS: 1.6 10*3/uL (ref 0.7–4.0)
LYMPHS PCT: 22 %
MCH: 30.7 pg (ref 26.0–34.0)
MCHC: 31.5 g/dL (ref 30.0–36.0)
MCV: 97.4 fL (ref 78.0–100.0)
Monocytes Absolute: 1.2 10*3/uL — ABNORMAL HIGH (ref 0.1–1.0)
Monocytes Relative: 16 %
NEUTROS ABS: 4.3 10*3/uL (ref 1.7–7.7)
Neutrophils Relative %: 58 %
Platelets: 239 10*3/uL (ref 150–400)
RBC: 3.13 MIL/uL — AB (ref 4.22–5.81)
RDW: 15.9 % — ABNORMAL HIGH (ref 11.5–15.5)
WBC: 7.4 10*3/uL (ref 4.0–10.5)

## 2017-08-16 LAB — CULTURE, RESPIRATORY W GRAM STAIN

## 2017-08-16 LAB — GLUCOSE, CAPILLARY
GLUCOSE-CAPILLARY: 115 mg/dL — AB (ref 70–99)
GLUCOSE-CAPILLARY: 112 mg/dL — AB (ref 70–99)
Glucose-Capillary: 113 mg/dL — ABNORMAL HIGH (ref 70–99)
Glucose-Capillary: 124 mg/dL — ABNORMAL HIGH (ref 70–99)
Glucose-Capillary: 90 mg/dL (ref 70–99)

## 2017-08-16 LAB — CULTURE, RESPIRATORY

## 2017-08-16 LAB — HEPARIN LEVEL (UNFRACTIONATED)
HEPARIN UNFRACTIONATED: 0.47 [IU]/mL (ref 0.30–0.70)
HEPARIN UNFRACTIONATED: 0.4 [IU]/mL (ref 0.30–0.70)

## 2017-08-16 MED ORDER — LORAZEPAM 2 MG/ML IJ SOLN: 1.0000 mg | INTRAMUSCULAR | Status: DC | PRN

## 2017-08-16 MED ORDER — LORAZEPAM 2 MG/ML IJ SOLN
0.5000 mg | INTRAMUSCULAR | Status: DC | PRN
  Filled 2017-08-16: qty 1

## 2017-08-16 MED ORDER — LACTATED RINGERS IV BOLUS
250.0000 mL | Freq: Once | INTRAVENOUS | Status: AC
  Administered 2017-08-16: 250 mL via INTRAVENOUS

## 2017-08-16 MED ORDER — DEXMEDETOMIDINE HCL IN NACL 400 MCG/100ML IV SOLN
0.4000 ug/kg/h | INTRAVENOUS | Status: DC
  Administered 2017-08-16 – 2017-08-18 (×3): 0.4 ug/kg/h via INTRAVENOUS
  Filled 2017-08-16 (×4): qty 100

## 2017-08-16 MED ORDER — LORAZEPAM 2 MG/ML IJ SOLN
1.0000 mg | Freq: Three times a day (TID) | INTRAMUSCULAR | Status: DC
  Administered 2017-08-16 – 2017-08-18 (×8): 1 mg via INTRAVENOUS
  Filled 2017-08-16 (×7): qty 1

## 2017-08-16 NOTE — Progress Notes (Signed)
ANTICOAGULATION CONSULT NOTE   Pharmacy Consult for Heparin Indication: DVT  Patient Measurements: Height: '5\' 2"'$  (157.5 cm) Weight: 164 lb 7.4 oz (74.6 kg) IBW/kg (Calculated) : 54.6 HEPARIN DW (KG): 70.9   Vital Signs: Temp: 99.4 F (37.4 C) (08/03 0836) Temp Source: Axillary (08/03 0836) BP: 158/90 (08/03 1100) Pulse Rate: 120 (08/03 1100)  Medications:  Scheduled:  . atorvastatin  80 mg Per Tube Daily  . chlorhexidine gluconate (MEDLINE KIT)  15 mL Mouth Rinse BID  . famotidine  20 mg Oral QHS  . insulin aspart  0-9 Units Subcutaneous Q4H  . LORazepam  1 mg Intravenous Q8H  . mouth rinse  15 mL Mouth Rinse 10 times per day  . phenytoin (DILANTIN) IV  100 mg Intravenous Q8H   Infusions:  . dexmedetomidine (PRECEDEX) IV infusion 0.5 mcg/kg/hr (08/16/17 1100)  . feeding supplement (VITAL AF 1.2 CAL) 1,500 mL (08/15/17 1743)  . heparin 1,550 Units/hr (08/16/17 1100)  . levETIRAcetam Stopped (08/16/17 0440)  . piperacillin-tazobactam (ZOSYN)  IV 12.5 mL/hr at 08/16/17 0800  . vancomycin Stopped (08/16/17 0306)    Assessment: Pt found to have new DVT in left upper extremity. Pharmacy consulted to manage heparin therapy. Hemoglobin stable, pltc increasing. No bleeding noted. Heparin level therapeutic x2.   Goal of Therapy:  Heparin level 0.3-0.7 units/ml Monitor platelets by anticoagulation protocol: Yes   Plan:  -Continue heparin at 1550 units/hr -Daily HL, CBC   Harvel Quale 08/16/2017 12:06 PM

## 2017-08-16 NOTE — Progress Notes (Signed)
eLink Physician-Brief Progress Note Patient Name: [name] Cruz DOB: December 27, 1955 MRN: [id number]   Date of Service  08/16/2017  HPI/Events of Note  Hypotension on Precedex infusion  eICU Interventions  Precedex stopped, Lactated Ringers 250 ml fluid bolus x 1 over 60 minutes        Alishea Beaudin U Latif Nazareno 08/16/2017, 6:22 AM

## 2017-08-16 NOTE — Progress Notes (Signed)
Subjective: Awake. Still on the vent.   Objective: Current vital signs: BP 92/60   Pulse 83   Temp 99.4 F (37.4 C) (Axillary)   Resp 18   Ht '5\' 2"'$  (1.575 m)   Wt 74.6 kg (164 lb 7.4 oz)   SpO2 100%   BMI 30.08 kg/m  Vital signs in last 24 hours: Temp:  [99.1 F (37.3 C)-101.2 F (38.4 C)] 99.4 F (37.4 C) (08/03 0836) Pulse Rate:  [73-117] 83 (08/03 0801) Resp:  [13-26] 18 (08/03 0801) BP: (81-166)/(54-106) 92/60 (08/03 0801) SpO2:  [97 %-100 %] 100 % (08/03 0803) FiO2 (%):  [30 %-40 %] 30 % (08/03 0803) Weight:  [74.6 kg (164 lb 7.4 oz)] 74.6 kg (164 lb 7.4 oz) (08/03 0500)  Intake/Output from previous day: 08/02 0701 - 08/03 0700 In: 2363.2 [I.V.:389.7; NG/GT:70; IV Piggyback:1903.6] Out: 7322 [Urine:1750] Intake/Output this shift: Total I/O In: 28 [I.V.:15.5; IV Piggyback:12.5] Out: -  Nutritional status:  Diet Order           Diet NPO time specified  Diet effective now          Neurologic Exam: Ment: Awake with eyes open. Attends to examiner. Able to count fingers by mouthing the words "five" and "one".  CN: PERRL. EOMI. Face symmetric. Intubated.  Motor: Will squeeze examiners hands weakly and wiggle toes. No asymmetry.   Lab Results: Results for orders placed or performed during the hospital encounter of 08/09/17 (from the past 48 hour(s))  Culture, respiratory (non-expectorated)     Status: None (Preliminary result)   Collection Time: 08/14/17 11:08 AM  Result Value Ref Range   Specimen Description TRACHEAL ASPIRATE    Special Requests NONE    Gram Stain      MODERATE WBC PRESENT, PREDOMINANTLY PMN FEW GRAM POSITIVE COCCI IN CLUSTERS RARE GRAM NEGATIVE RODS RARE GRAM NEGATIVE DIPLOCOCCI    Culture      ABUNDANT STAPHYLOCOCCUS AUREUS SUSCEPTIBILITIES TO FOLLOW Performed at [facility], [place] [address] [place], [place] [address]    Report Status PENDING   Glucose, capillary     Status: Abnormal   Collection Time: 08/14/17 12:09 PM   Result Value Ref Range   Glucose-Capillary 142 (H) 70 - 99 mg/dL   Comment 1 Capillary Specimen   Glucose, capillary     Status: Abnormal   Collection Time: 08/14/17  3:27 PM  Result Value Ref Range   Glucose-Capillary 122 (H) 70 - 99 mg/dL  Glucose, capillary     Status: Abnormal   Collection Time: 08/14/17  7:55 PM  Result Value Ref Range   Glucose-Capillary 112 (H) 70 - 99 mg/dL   Comment 1 Capillary Specimen   Heparin level (unfractionated)     Status: Abnormal   Collection Time: 08/14/17  8:32 PM  Result Value Ref Range   Heparin Unfractionated <0.10 (L) 0.30 - 0.70 IU/mL    Comment: (NOTE) If heparin results are below expected values, and patient dosage has  been confirmed, suggest follow up testing of antithrombin III levels. Performed at [facility], [place] [address], [place], [place] [address]   Glucose, capillary     Status: Abnormal   Collection Time: 08/14/17 11:44 PM  Result Value Ref Range   Glucose-Capillary 142 (H) 70 - 99 mg/dL   Comment 1 Capillary Specimen   Glucose, capillary     Status: Abnormal   Collection Time: 08/15/17  3:43 AM  Result Value Ref Range   Glucose-Capillary 122 (H)  70 - 99 mg/dL   Comment 1 Capillary Specimen   CBC with Differential/Platelet     Status: Abnormal   Collection Time: 08/15/17  5:07 AM  Result Value Ref Range   WBC 7.3 4.0 - 10.5 K/uL   RBC 2.86 (L) 4.22 - 5.81 MIL/uL   Hemoglobin 8.8 (L) 13.0 - 17.0 g/dL   HCT 28.5 (L) 39.0 - 52.0 %   MCV 99.7 78.0 - 100.0 fL   MCH 30.8 26.0 - 34.0 pg   MCHC 30.9 30.0 - 36.0 g/dL   RDW 16.3 (H) 11.5 - 15.5 %   Platelets 219 150 - 400 K/uL   Neutrophils Relative % 58 %   Neutro Abs 4.2 1.7 - 7.7 K/uL   Lymphocytes Relative 19 %   Lymphs Abs 1.4 0.7 - 4.0 K/uL   Monocytes Relative 19 %   Monocytes Absolute 1.4 (H) 0.1 - 1.0 K/uL   Eosinophils Relative 2 %   Eosinophils Absolute 0.2 0.0 - 0.7 K/uL   Basophils Relative 1 %   Basophils Absolute 0.0 0.0 - 0.1 K/uL   Immature  Granulocytes 1 %   Abs Immature Granulocytes 0.1 0.0 - 0.1 K/uL    Comment: Performed at [facility], [address], [place], [place] [address]  Heparin level (unfractionated)     Status: None   Collection Time: 08/15/17  5:07 AM  Result Value Ref Range   Heparin Unfractionated 0.33 0.30 - 0.70 IU/mL    Comment: (NOTE) If heparin results are below expected values, and patient dosage has  been confirmed, suggest follow up testing of antithrombin III levels. Performed at [facility], [place] [address], [place], [place] [address]   Glucose, capillary     Status: Abnormal   Collection Time: 08/15/17  7:24 AM  Result Value Ref Range   Glucose-Capillary 115 (H) 70 - 99 mg/dL   Comment 1 Capillary Specimen   Heparin level (unfractionated)     Status: Abnormal   Collection Time: 08/15/17 11:25 AM  Result Value Ref Range   Heparin Unfractionated 0.17 (L) 0.30 - 0.70 IU/mL    Comment: (NOTE) If heparin results are below expected values, and patient dosage has  been confirmed, suggest follow up testing of antithrombin III levels. Performed at [facility], [place] [address], [place], [place] [address]   Glucose, capillary     Status: Abnormal   Collection Time: 08/15/17 11:45 AM  Result Value Ref Range   Glucose-Capillary 118 (H) 70 - 99 mg/dL   Comment 1 Capillary Specimen   Glucose, capillary     Status: Abnormal   Collection Time: 08/15/17  4:13 PM  Result Value Ref Range   Glucose-Capillary 118 (H) 70 - 99 mg/dL   Comment 1 Capillary Specimen   Heparin level (unfractionated)     Status: Abnormal   Collection Time: 08/15/17  7:34 PM  Result Value Ref Range   Heparin Unfractionated 0.19 (L) 0.30 - 0.70 IU/mL    Comment: (NOTE) If heparin results are below expected values, and patient dosage has  been confirmed, suggest follow up testing of antithrombin III levels. Performed at [facility], [place] [address], [place], [place] [address]   CBC with  Differential/Platelet     Status: Abnormal   Collection Time: 08/16/17  2:40 AM  Result Value Ref Range   WBC 7.4 4.0 - 10.5 K/uL   RBC 3.13 (L) 4.22 - 5.81 MIL/uL   Hemoglobin 9.6 (L)  13.0 - 17.0 g/dL   HCT 30.5 (L) 39.0 - 52.0 %   MCV 97.4 78.0 - 100.0 fL   MCH 30.7 26.0 - 34.0 pg   MCHC 31.5 30.0 - 36.0 g/dL   RDW 15.9 (H) 11.5 - 15.5 %   Platelets 239 150 - 400 K/uL   Neutrophils Relative % 58 %   Neutro Abs 4.3 1.7 - 7.7 K/uL   Lymphocytes Relative 22 %   Lymphs Abs 1.6 0.7 - 4.0 K/uL   Monocytes Relative 16 %   Monocytes Absolute 1.2 (H) 0.1 - 1.0 K/uL   Eosinophils Relative 2 %   Eosinophils Absolute 0.2 0.0 - 0.7 K/uL   Basophils Relative 0 %   Basophils Absolute 0.0 0.0 - 0.1 K/uL   Immature Granulocytes 2 %   Abs Immature Granulocytes 0.1 0.0 - 0.1 K/uL    Comment: Performed at [place] [address], [place], [place] [address]  Heparin level (unfractionated)     Status: None   Collection Time: 08/16/17  2:40 AM  Result Value Ref Range   Heparin Unfractionated 0.47 0.30 - 0.70 IU/mL    Comment: (NOTE) If heparin results are below expected values, and patient dosage has  been confirmed, suggest follow up testing of antithrombin III levels. Performed at [facility], [place] [address] [place], [place] [address]   Glucose, capillary     Status: Abnormal   Collection Time: 08/16/17  3:55 AM  Result Value Ref Range   Glucose-Capillary 115 (H) 70 - 99 mg/dL  Glucose, capillary     Status: Abnormal   Collection Time: 08/16/17  8:38 AM  Result Value Ref Range   Glucose-Capillary 112 (H) 70 - 99 mg/dL    Recent Results (from the past 240 hour(s))  MRSA PCR Screening     Status: None   Collection Time: 08/09/17  5:39 PM  Result Value Ref Range Status   MRSA by PCR NEGATIVE NEGATIVE Final    Comment:        The GeneXpert MRSA Assay (FDA approved for NASAL specimens only), is one component of a comprehensive MRSA colonization surveillance program.  It is not intended to diagnose MRSA infection nor to guide or monitor treatment for MRSA infections. Performed at [facility], [place] [address], [place], [place] [address]   Culture, blood (Routine X 2) w Reflex to ID Panel     Status: None   Collection Time: 08/09/17  6:04 PM  Result Value Ref Range Status   Specimen Description BLOOD SITE NOT SPECIFIED  Final   Special Requests   Final    BOTTLES DRAWN AEROBIC AND ANAEROBIC Blood Culture adequate volume   Culture   Final    NO GROWTH 5 DAYS Performed at [facility], [address] [place], [place] [address]    Report Status 08/14/2017 FINAL  Final  Culture, blood (Routine X 2) w Reflex to ID Panel     Status: None   Collection Time: 08/10/17  8:31 AM  Result Value Ref Range Status   Specimen Description BLOOD RIGHT HAND  Final   Special Requests   Final    BOTTLES DRAWN AEROBIC AND ANAEROBIC Blood Culture adequate volume   Culture   Final    NO GROWTH 5 DAYS Performed at [facility], [place] [address], [place], [place] [address]    Report Status 08/15/2017 FINAL  Final  Culture, respiratory (non-expectorated)  Status: None (Preliminary result)   Collection Time: 08/14/17 11:08 AM  Result Value Ref Range Status   Specimen Description TRACHEAL ASPIRATE  Final   Special Requests NONE  Final   Gram Stain   Final    MODERATE WBC PRESENT, PREDOMINANTLY PMN FEW GRAM POSITIVE COCCI IN CLUSTERS RARE GRAM NEGATIVE RODS RARE GRAM NEGATIVE DIPLOCOCCI    Culture   Final    ABUNDANT STAPHYLOCOCCUS AUREUS SUSCEPTIBILITIES TO FOLLOW Performed at [facility], [place] [address] [place], [place] [address]    Report Status PENDING  Incomplete    Lipid Panel No results for input(s): CHOL, TRIG, HDL, CHOLHDL, VLDL, LDLCALC in the last 72 hours.  Studies/Results: Dg Chest Port 1 View  Result Date: 08/15/2017 CLINICAL DATA:  Endotracheal tube placement EXAM: PORTABLE CHEST 1 VIEW COMPARISON:  08/13/2017 chest  radiograph FINDINGS: Unchanged position of endotracheal tube and nasogastric tube. Sequelae of remote CABG and median sternotomy. Cardiomediastinal contours are normal. No focal airspace consolidation or pulmonary edema. No pneumothorax or sizable pleural effusion. IMPRESSION: Unchanged appearance of support apparatus. No acute airspace disease. Electronically Signed   By: Ulyses Jarred M.D.   On: 08/15/2017 17:38    Medications:  Scheduled: . atorvastatin  80 mg Per Tube Daily  . chlorhexidine gluconate (MEDLINE KIT)  15 mL Mouth Rinse BID  . famotidine  20 mg Oral QHS  . furosemide  20 mg Intravenous Daily  . insulin aspart  0-9 Units Subcutaneous Q4H  . LORazepam  1 mg Intravenous Q8H  . mouth rinse  15 mL Mouth Rinse 10 times per day  . phenytoin (DILANTIN) IV  100 mg Intravenous Q8H   Continuous: . dexmedetomidine (PRECEDEX) IV infusion Stopped (08/16/17 5790)  . feeding supplement (VITAL AF 1.2 CAL) 1,500 mL (08/15/17 1743)  . heparin 1,550 Units/hr (08/16/17 0800)  . lacosamide (VIMPAT) IV Stopped (08/15/17 2207)  . levETIRAcetam Stopped (08/16/17 0440)  . piperacillin-tazobactam (ZOSYN)  IV 12.5 mL/hr at 08/16/17 0800  . vancomycin Stopped (08/16/17 0306)    Assessment:Status epilepticus in the setting of seizure disorder following hemorrhagic stroke, EtOH abuse and recently having been taken off Dilantin.  1. Patient no longer in burst suppression after discontinuation of propofol. Continues on Versed at 5 mg/hr. 2. Covered with Dilantin at 100 mg q8h IV.Also on Keppra 1000 mg BID and Vimpat 200 mg BID.Will also continue after he has been weaned off Versed.  3. Status post cardiac arrest. Per Cardiology, the initial event in the grocery store appears most likely to be related to seizure. 4. Has been successfully weaned off Versed. Now on scheduled Ativan 1 mg IV q8h  Recommendations: 1.Start Ativan taper after 3 more days (on 8/6).  .  2. Continue Dilantin at 100 mg  q8h. 3. Continue Keppra 1000 mg BID  4. Discontinuing Vimpat.  5. Goal next will be to taper off Keppra and have patient on Dilantin monotherapy.  6. Extubate when able to tolerate.   35 minutes spent in the neurological evaluation and management of this critically ill patient.   LOS: 7 days   '@Electronically'$  signed: Dr. Kerney Elbe 08/16/2017  8:54 AM

## 2017-08-16 NOTE — Progress Notes (Signed)
PULMONARY / CRITICAL CARE MEDICINE   Name: [name] Cruz MRN: [id number] DOB: [birth year]    ADMISSION DATE:  08/09/2017 CONSULTATION DATE:  08/09/2017  REFERRING MD:  Dr. belfi/EDP  CHIEF COMPLAINT:  Seizure  HISTORY OF PRESENT ILLNESS:   This is a 62 y/o M who presented to [facility] on 7/27 via EMS with reports of seizures.    Per patients son, he was in line at [employer] and then had garbled speech, spun around and fell to the ground with seizure like activity.  EMS reported the patient was post-ictal on arrival to scene.  PMH of large hemorrhagic CVA in 2007 with residual speech difficulties.  The patient was alert/oriented to self & place on arrival to ER.  While in the room, he was witnessed to have altered speech and couldn't tell the staff where he was etc. He then had a seizure and was treated with 1mg  IV ativan.  He relaxed after about 30 seconds.     He was sent for CT head and while in the scanner he had an episode of ventricular tachycardia with arrest.  CPR was initiated and he was returned to the ER.  By the time he arrived to the ER, he was in a narrow complex rhythm without a pulse.  He was not shocked.  CPR total time ~ 10 minutes. Pt treated with amiodarone, EPI x2 and intubated without drugs. During intubation, he seized another time. Initial labs-Na 132, K 3.9, CL 99, CO2 22, glucose 206, BUN 10 / sr cr 0.85, Mg 1.8, WBC 7.9, Hgb 11.2 and platelets 231.  CXR pending.  CT of the head 7/27 showed no acute abnormality, moderate chronic small vessel disease, stable chronic ventriculomegaly compatible with left temporal occipital encephalomalacia / central volume loss. PCCM called for ICU admission. Of note, on medication review, the patient last filled his phenytoin on 03/27/17 (takes 100mg  TID) for a three month supply.  Raises question of running out of meds?? Family thinks he had been out for a "little while".  Admitted for hypothermia protocol but patient was purposeful on arrival to  ICU, discontinued.  Deep sedation initiated per Neuro for burst suppression.  PAST MEDICAL HISTORY :  He  has a past medical history of Cognitive impairment (2007), History of chicken pox, HLD (hyperlipidemia), HTN (hypertension), NSTEMI (non-ST elevated myocardial infarction) (HCC) (02/21/2017), Obesity, and Stroke, hemorrhagic (HCC) (2007).  PAST SURGICAL HISTORY: He  has a past surgical history that includes Ankle surgery (1990s); LEFT HEART CATH AND CORONARY ANGIOGRAPHY (N/A, 02/21/2017); IABP Insertion (N/A, 02/21/2017); Coronary artery bypass graft (N/A, 02/24/2017); and TEE without cardioversion (N/A, 02/24/2017).  No Known Allergies  No current facility-administered medications on file prior to encounter.    Current Outpatient Medications on File Prior to Encounter  Medication Sig  . aspirin EC 325 MG EC tablet Take 1 tablet (325 mg total) by mouth daily.  Marland Kitchen atorvastatin (LIPITOR) 80 MG tablet TAKE 1 TABLET (80 MG TOTAL) BY MOUTH DAILY. FOR CHOLESTEROL (Patient taking differently: Take 80 mg by mouth daily. FOR CHOLESTEROL)  . carvedilol (COREG) 3.125 MG tablet Take 1 tablet (3.125 mg total) by mouth 2 (two) times daily with a meal.  . ezetimibe (ZETIA) 10 MG tablet Take 1 tablet (10 mg total) by mouth daily.  Marland Kitchen losartan (COZAAR) 25 MG tablet Take 1 tablet (25 mg total) by mouth daily.  Marland Kitchen oxyCODONE (OXY IR/ROXICODONE) 5 MG immediate release tablet Take 1-2 tablets (5-10 mg total) by mouth every 6 (  six) hours as needed for severe pain. (Patient not taking: Reported on 07/12/2017)  . phenytoin (DILANTIN) 100 MG ER capsule Take 100 mg by mouth 3 (three) times daily.    FAMILY HISTORY:  His family history includes Alcohol abuse in his father; Alzheimer's disease in his maternal grandfather; Cancer in his mother. There is no history of Coronary artery disease, Stroke, or Diabetes.  SOCIAL HISTORY: He  reports that he has never smoked. He has quit using smokeless tobacco. He reports that he  drinks about 8.4 oz of alcohol per week. He reports that he does not use drugs.  SUBJECTIVE:  Patient indicates no c/o  VITAL SIGNS: BP 114/72   Pulse 97   Temp 99.5 F (37.5 C) (Axillary)   Resp 13   Ht 5\' 2"  (1.575 m)   Wt 74.6 kg (164 lb 7.4 oz)   SpO2 99%   BMI 30.08 kg/m   HEMODYNAMICS:    VENTILATOR SETTINGS: Vent Mode: PSV;CPAP FiO2 (%):  [30 %-40 %] 30 % Set Rate:  [18 bmp] 18 bmp Vt Set:  [440 mL] 440 mL PEEP:  [5 cmH20] 5 cmH20 Pressure Support:  [5 cmH20] 5 cmH20 Plateau Pressure:  [11 cmH20-15 cmH20] 15 cmH20  INTAKE / OUTPUT: I/O last 3 completed shifts: In: 4235.5 [I.V.:562.1; Other:515; NG/GT:880; IV Piggyback:2278.5] Out: 1960 [Urine:1960]  PHYSICAL EXAMINATION: General:  WD/WN WM NARD Neuro:  Alert and follows commands. Grossly w/o focal deficits HEENT:  [place]/At PRRL EOMI; ETT in place Cardiovascular:  RRR, no M/R/G Lungs:  Bilateral rhonchi Abdomen:  Supple, NT, +BS Musculoskeletal:  No active joints Skin:  No C/C/E  LABS:  BMET Recent Labs  Lab 08/12/17 0411 08/13/17 0339 08/14/17 0310  NA 141 143 144  K 3.2* 3.8 3.5  CL 112* 106 106  CO2 21* 26 28  BUN 5* 10 13  CREATININE 0.65 0.55* 0.59*  GLUCOSE 113* 141* 146*    Electrolytes Recent Labs  Lab 08/10/17 0315  08/12/17 0411 08/13/17 0339 08/14/17 0310  CALCIUM 7.5*   < > 7.7* 7.7* 8.0*  MG 1.8  --   --   --  2.1  PHOS 3.0  --   --   --   --    < > = values in this interval not displayed.    CBC Recent Labs  Lab 08/14/17 0310 08/15/17 0507 08/16/17 0240  WBC 8.4 7.3 7.4  HGB 9.6* 8.8* 9.6*  HCT 31.4* 28.5* 30.5*  PLT 187 219 239    Coag's No results for input(s): APTT, INR in the last 168 hours.  Sepsis Markers No results for input(s): LATICACIDVEN, PROCALCITON, O2SATVEN in the last 168 hours.  ABG Recent Labs  Lab 08/09/17 1734 08/10/17 0506 08/12/17 0631  PHART 7.225* 7.462* 7.365  PCO2ART 52.7* 29.1* 39.2  PO2ART 79.0* 204* 89.0    Liver  Enzymes Recent Labs  Lab 08/11/17 0418  AST 51*  ALT 41  ALKPHOS 58  BILITOT 0.7  ALBUMIN 2.6*    Cardiac Enzymes Recent Labs  Lab 08/10/17 0315 08/10/17 0920 08/12/17 0813  TROPONINI 0.28* 0.15* 0.05*    Glucose Recent Labs  Lab 08/15/17 0724 08/15/17 1145 08/15/17 1613 08/16/17 0355 08/16/17 0838 08/16/17 1214  GLUCAP 115* 118* 118* 115* 112* 113*    Imaging Dg Chest Port 1 View  Result Date: 08/15/2017 CLINICAL DATA:  Endotracheal tube placement EXAM: PORTABLE CHEST 1 VIEW COMPARISON:  08/13/2017 chest radiograph FINDINGS: Unchanged position of endotracheal tube and nasogastric tube. Sequelae of  remote CABG and median sternotomy. Cardiomediastinal contours are normal. No focal airspace consolidation or pulmonary edema. No pneumothorax or sizable pleural effusion. IMPRESSION: Unchanged appearance of support apparatus. No acute airspace disease. Electronically Signed   By: Deatra RobinsonKevin  Herman M.D.   On: 08/15/2017 17:38     STUDIES:  PCXR yesterday NAD  CULTURES: BCx2 7/27 >> no growth to date UA 7/27 >> negative  Trach aspirate 08/14/2017>> gram-positive cocci in clusters, gram-negative rods.  ANTIBIOTICS: Vanc Zosyn  SIGNIFICANT EVENTS: No recent  LINES/TUBES: ETT 7/27 >> R IJ TLC 7/27 >>   DISCUSSION: 62 y/o M admitted after seizure.  While in CT, he suffered a cardiac arrest with suspected VT as initial rhythm.  Arrest thought to be ~ 10 minutes.  He was transferred back to the ER with CPR in progress from the CT scanner and on arrival had a narrow complex rhythm with no pulse. Intubated without drugs.  Seized during intubation.  Initially was to undergo hypothermia protocol but after arriving to ICU from ER, was thought to be purposeful and hypothermia protocol discontinued.  He has had continuous EEG with persistent seizures despite propofol/versed.  Suppression achieved 7/29. Propofol off 7/30. Versed gtt stopped 7/31.  8/1, 8/2 in pressure support. Mental  status today appears alert enough for extubation.    ASSESSMENT / PLAN:  PULMONARY A: Ventilation and oxygenation adequate on PSV 5/5 30% FIO2 P:   Will plan to extubate  CARDIOVASCULAR A:  S/P CP arrest P:  Continue losartan, Coreg, atorvastatin hold Zetia Monitor changes in QT on telemetry  RENAL A:   Nl renal indices; lytes corrected P:   Trend  GASTROINTESTINAL A:   No acute issues P:   Assess for po feeds once extubated  HEMATOLOGIC A:   [place]/ anemia likely related to acute illness P:  Trend  INFECTIOUS A:   Remains afebrile and without leukocytosis P:   Cont Abx until final cultures  ENDOCRINE A:   Stress-related inc glucose   P:   SSI  NEUROLOGIC A:   Resolved status epilepticus P:   Continue Vimpat, keppra, Dilantin per Neurol  Critical care time: 30 min   Pulmonary and Critical Care Medicine [facility] Pager: [phone]  08/16/2017, 1:47 PM

## 2017-08-16 NOTE — Progress Notes (Signed)
eLink Physician-Brief Progress Note Patient Name: [name] Cruz DOB: 1955/08/25 MRN: [id number]   Date of Service  08/16/2017  HPI/Events of Note  Agitation  eICU Interventions  Precedex infusion ordered        [name] Cruz Theall 08/16/2017, 2:29 AM

## 2017-08-16 NOTE — Progress Notes (Signed)
ANTICOAGULATION CONSULT NOTE   Pharmacy Consult for Heparin Indication: DVT  No Known Allergies  Patient Measurements: Height: _0  (157.5 cm) Weight: 167 lb 8.8 oz (76 kg) IBW/kg (Calculated) : 54.6 HEPARIN DW (KG): 70.9   Vital Signs: Temp: 99.1 F (37.3 C) (08/03 0000) Temp Source: Axillary (08/03 0000) BP: 128/78 (08/03 0200) Pulse Rate: 104 (08/03 0200)  Labs: Recent Labs    08/13/17 0339 08/14/17 0310  08/15/17 0507 08/15/17 1125 08/15/17 1934 08/16/17 0240  HGB 9.8* 9.6*  --  8.8*  --   --  9.6*  HCT 30.7* 31.4*  --  28.5*  --   --  30.5*  PLT 153 187  --  219  --   --  239  HEPARINUNFRC  --   --    < > 0.33 0.17* 0.19* 0.47  CREATININE 0.55* 0.59*  --   --   --   --   --    < > = values in this interval not displayed.    Estimated Creatinine Clearance: 85.6 mL/min (A) (by C-G formula based on SCr of 0.59 mg/dL (L)).   Medical History: Past Medical History:  Diagnosis Date  . Cognitive impairment 2007   after stroke, saw rehab but told to stop because was too upsetting to him  . History of chicken pox   . HLD (hyperlipidemia)   . HTN (hypertension)   . NSTEMI (non-ST elevated myocardial infarction) ([place]) 02/21/2017  . Obesity   . Stroke, hemorrhagic ([place]) 2007   thought 2/2 HTN (240sbp); residual cognitive impairment, loss of R peripheral field, no driving    Medications:  Scheduled:  . atorvastatin  80 mg Per Tube Daily  . chlorhexidine gluconate (MEDLINE KIT)  15 mL Mouth Rinse BID  . famotidine  20 mg Oral QHS  . furosemide  20 mg Intravenous Daily  . insulin aspart  0-9 Units Subcutaneous Q4H  . mouth rinse  15 mL Mouth Rinse 10 times per day  . phenytoin (DILANTIN) IV  100 mg Intravenous Q8H   Infusions:  . dexmedetomidine (PRECEDEX) IV infusion 0.4 mcg/kg/hr (08/16/17 0238)  . feeding supplement (VITAL AF 1.2 CAL) 1,500 mL (08/15/17 1743)  . heparin 1,550 Units/hr (08/16/17 0200)  . lacosamide (VIMPAT) IV Stopped (08/15/17 2207)  .  levETIRAcetam Stopped (08/15/17 1739)  . piperacillin-tazobactam (ZOSYN)  IV 12.5 mL/hr at 08/16/17 0200  . vancomycin 1,000 mg (08/16/17 0206)    Assessment: Pt found to have new DVT in left upper arm. Pharmacy consulted to manage heparin therapy. Hemoglobin low, but stable; pltc increasing. No bleeding noted.    Heparin level therapeutic x 1 after rate increase yesterday evening  Goal of Therapy:  Heparin level 0.3-0.7 units/ml Monitor platelets by anticoagulation protocol: Yes   Plan:  -Cont heparin 1550 units/hr -[place], PharmD, BCPS Clinical Pharmacist Phone: [phone]

## 2017-08-16 NOTE — Procedures (Signed)
Extubation Procedure Note  Patient Details:   Name: [name] Cruz DOB: Aug 06, 1955 MRN: [id number]   Airway Documentation:    Vent end date: 08/16/17 Vent end time: 1430   Evaluation  O2 sats: stable throughout Complications: No apparent complications Patient did tolerate procedure well. Bilateral Breath Sounds: Clear, Diminished   Yes   Patient extubated per order to 3L [place] with no apparent complications. Positive cuff leak was noted prior to extubation. Patient is alert and is able to speak in a low,weak voice. He states he goes by [name]"[name] Cruz" and not AllynBilly. Vitals are stable. RT will continue to monitor.   Avagrace Botelho Lajuana RippleM Dajah Fischman 08/16/2017, 2:35 PM

## 2017-08-17 ENCOUNTER — Inpatient Hospital Stay (HOSPITAL_COMMUNITY): Payer: Medicare Other

## 2017-08-17 ENCOUNTER — Inpatient Hospital Stay: Payer: Self-pay

## 2017-08-17 DIAGNOSIS — R4182 Altered mental status, unspecified: Secondary | ICD-10-CM

## 2017-08-17 DIAGNOSIS — Z9289 Personal history of other medical treatment: Secondary | ICD-10-CM

## 2017-08-17 LAB — CBC WITH DIFFERENTIAL/PLATELET
Abs Immature Granulocytes: 0.1 10*3/uL (ref 0.0–0.1)
BASOS ABS: 0 10*3/uL (ref 0.0–0.1)
BASOS PCT: 1 %
EOS PCT: 4 %
Eosinophils Absolute: 0.2 10*3/uL (ref 0.0–0.7)
HCT: 29.1 % — ABNORMAL LOW (ref 39.0–52.0)
Hemoglobin: 9 g/dL — ABNORMAL LOW (ref 13.0–17.0)
Immature Granulocytes: 2 %
Lymphocytes Relative: 22 %
Lymphs Abs: 1.5 10*3/uL (ref 0.7–4.0)
MCH: 30.7 pg (ref 26.0–34.0)
MCHC: 30.9 g/dL (ref 30.0–36.0)
MCV: 99.3 fL (ref 78.0–100.0)
MONO ABS: 0.9 10*3/uL (ref 0.1–1.0)
Monocytes Relative: 14 %
NEUTROS ABS: 3.9 10*3/uL (ref 1.7–7.7)
Neutrophils Relative %: 57 %
Platelets: 253 10*3/uL (ref 150–400)
RBC: 2.93 MIL/uL — ABNORMAL LOW (ref 4.22–5.81)
RDW: 15.5 % (ref 11.5–15.5)
WBC: 6.6 10*3/uL (ref 4.0–10.5)

## 2017-08-17 LAB — PHENYTOIN LEVEL, TOTAL: PHENYTOIN LVL: 3.4 ug/mL — AB (ref 10.0–20.0)

## 2017-08-17 LAB — GLUCOSE, CAPILLARY
GLUCOSE-CAPILLARY: 123 mg/dL — AB (ref 70–99)
GLUCOSE-CAPILLARY: 77 mg/dL (ref 70–99)
GLUCOSE-CAPILLARY: 60 mg/dL — AB (ref 70–99)
GLUCOSE-CAPILLARY: 76 mg/dL (ref 70–99)
Glucose-Capillary: 122 mg/dL — ABNORMAL HIGH (ref 70–99)
Glucose-Capillary: 91 mg/dL (ref 70–99)
Glucose-Capillary: 109 mg/dL — ABNORMAL HIGH (ref 70–99)
Glucose-Capillary: 79 mg/dL (ref 70–99)

## 2017-08-17 LAB — BASIC METABOLIC PANEL
Anion gap: 12 (ref 5–15)
BUN: 19 mg/dL (ref 8–23)
CALCIUM: 8.5 mg/dL — AB (ref 8.9–10.3)
CO2: 27 mmol/L (ref 22–32)
Chloride: 104 mmol/L (ref 98–111)
Creatinine, Ser: 0.55 mg/dL — ABNORMAL LOW (ref 0.61–1.24)
GFR calc Af Amer: >60 mL/min (ref >60)
Glucose, Bld: 94 mg/dL (ref 70–99)
Potassium: 3 mmol/L — ABNORMAL LOW (ref 3.5–5.1)
Sodium: 143 mmol/L (ref 135–145)

## 2017-08-17 LAB — HEPARIN LEVEL (UNFRACTIONATED): HEPARIN UNFRACTIONATED: 0.33 [IU]/mL (ref 0.30–0.70)

## 2017-08-17 MED ORDER — POTASSIUM CHLORIDE 10 MEQ/100ML IV SOLN: 10.0000 meq | INTRAVENOUS | Status: DC

## 2017-08-17 MED ORDER — DEXTROSE 50 % IV SOLN
INTRAVENOUS | Status: AC
  Administered 2017-08-17 – 2017-08-18 (×2): 25 mL
  Filled 2017-08-17: qty 50

## 2017-08-17 MED ORDER — POTASSIUM CHLORIDE 10 MEQ/100ML IV SOLN
10.0000 meq | INTRAVENOUS | Status: AC
  Administered 2017-08-17 (×4): 10 meq via INTRAVENOUS
  Filled 2017-08-17 (×3): qty 100

## 2017-08-17 NOTE — Progress Notes (Signed)
Patient scheduled for MBS. Procedure deferred due to increased agitation-combative and uncooperative. MD made aware. Continuing to monitor closely.

## 2017-08-17 NOTE — Evaluation (Signed)
Clinical/Bedside Swallow Evaluation Patient Details  Name: [name] Cruz MRN: [id number] Date of Birth: 1956-01-04  Today's Date: 08/17/2017 Time: SLP Start Time (ACUTE ONLY): 0830 SLP Stop Time (ACUTE ONLY): 0850 SLP Time Calculation (min) (ACUTE ONLY): 20 min  Past Medical History:  Past Medical History:  Diagnosis Date  . Cognitive impairment 2007   after stroke, saw rehab but told to stop because was too upsetting to him  . History of chicken pox   . HLD (hyperlipidemia)   . HTN (hypertension)   . NSTEMI (non-ST elevated myocardial infarction) (HCC) 02/21/2017  . Obesity   . Stroke, hemorrhagic (HCC) 2007   thought 2/2 HTN (240sbp); residual cognitive impairment, loss of R peripheral field, no driving   Past Surgical History:  Past Surgical History:  Procedure Laterality Date  . ANKLE SURGERY  1990s   right foot with plate and screws  . CORONARY ARTERY BYPASS GRAFT N/A 02/24/2017   3v Procedure: CORONARY ARTERY BYPASS GRAFTING (CABG) x 3 ON PUMP USING LEFT INTERNAL MAMMARY ARTERY TO LEFT ANTERIOR DESENDING CORNARY ARTERY, RIGHT GREATER SAPHENOUS VEIN TO LEFT CIRCUMFLEX ARTERY AND POSTERIOR DESENDING ARTERY. RIGHT GREATER SAPHENOUS VEIN OBTAINED VIA ENDOVEIN HARVEST.;  Surgeon: Delight OvensGerhardt, Edward B, MD  . IABP INSERTION N/A 02/21/2017   Procedure: IABP Insertion;  Surgeon: Yvonne KendallEnd, Christopher, MD;  Location: [facility] INVASIVE CV LAB;  Service: Cardiovascular;  Laterality: N/A;  . LEFT HEART CATH AND CORONARY ANGIOGRAPHY N/A 02/21/2017   Procedure: LEFT HEART CATH AND CORONARY ANGIOGRAPHY;  Surgeon: Yvonne KendallEnd, Christopher, MD;  Location: [facility] INVASIVE CV LAB;  Service: Cardiovascular;  Laterality: N/A;  . TEE WITHOUT CARDIOVERSION N/A 02/24/2017   Procedure: TRANSESOPHAGEAL ECHOCARDIOGRAM (TEE);  Surgeon: Delight OvensGerhardt, Edward B, MD;  [facility];  Service: Open Heart Surgery;  Laterality: N/A;   HPI:  62 y/o M [facility] with reports of seizures.  Per patients son, he was in line  at [employer] and then had garbled speech, spun around and fell to the ground with seizure like activity.  EMS reported the patient was post-ictal on arrival to scene.  PMH of large hemorrhagic CVA in 2007 with residual speech difficulties.  The patient was alert/oriented to self & place on arrival to ER.  While in the room, he was witnessed to have altered speech and couldn't tell the staff where he was etc. He then had a seizure and was treated with 1mg  IV ativan.  He relaxed after about 30 seconds.   He was sent for CT head and while in the scanner he had an episode of ventricular tachycardia with arrest.  CPR was initiated and he was returned to the ER.  By the time he arrived to the ER, he was in a narrow complex rhythm without a pulse.  He was not shocked.  CPR total time ~ 10 minutes. Pt treated with amiodarone, EPI x2 and intubated without drugs. During intubation, he seized another time. Initial labs-Na 132, K 3.9, CL 99, CO2 22, glucose 206, BUN 10 / sr cr 0.85, Mg 1.8, WBC 7.9, Hgb 11.2 and platelets 231.  CXR pending.  CT of the head 7/27 showed no acute abnormality, moderate chronic small vessel disease, stable chronic ventriculomegaly compatible with left temporal occipital encephalomalacia / central volume loss.   He was intubated from 08/09/2017-08/16/2017.  Most recent chest xray is showing no active airspace disease.  MRI of the head dated 08/14/2017 is whosing symmetric increased FLAIR and diffusion signoal abnormality involving  the medial temporal lobes/hippocampi billaterally most likely sequelae of seizures, 6mm infarct right parietal lobe and sequelae of remote hemorrhagic infarct left parietal/temporal regions as well as mutiple chronic microhemorrhages.     Assessment / Plan / Recommendation Clinical Impression  Clinical swallowing evaluation was completed using ice chips, thin liquids via spoon and pureed material.  The patient was alert but confused and unable to follow all requested  commands.   Oral mechanism exam was completed and left side facial/labial droop was seen as well as lingual deviation to the left given movement.  Formal assessment of lingual and labial strength were unable to be fully completed but suspect weakness.  Volitional cough was weak.  Patient observed with much stronger spontaneous cough.  The patient presented with a probable oropharyngeal dysphagia.  The oral phase was marked by anterior escaped and delayed oral transit.  Swallow trigger appeared to be timely and hyo-laryngeal movement was appreciated to palpation.  Multiple swallows were seen across all bolus trials with immediate throat clears given thin liquids and pureed material leading to suspicion for pharyngeal residue and increased aspiration risk.  Given clinical presentation, reason for admission and 8 day intubation recommend that the patient remain NPO pending results of MBS to determine current swallowing physiology and least restrictive diet.    MD please order cognitive/linguistic evaluation due to probable worsening of cognitive/linguistic deficits.    SLP Visit Diagnosis: Dysphagia, oropharyngeal phase (R13.12)    Aspiration Risk  Moderate aspiration risk    Diet Recommendation  NPO   Medication Administration: Via alternative means    Other  Recommendations Oral Care Recommendations: Oral care QID   Follow up Recommendations Other (comment)(Patient will most likely need ST follow up at next level.  )      Frequency and Duration min 2x/week  2 weeks       Prognosis Prognosis for Safe Diet Advancement: Fair Barriers to Reach Goals: Severity of deficits;Other (Comment)(PMH)      Swallow Study   General Date of Onset: 08/09/17 HPI: 62 y/o M who presented to [facility] on 7/27 via EMS with reports of seizures.  Per patients son, he was in line at [employer] and then had garbled speech, spun around and fell to the ground with seizure like activity.  EMS reported the patient was  post-ictal on arrival to scene.  PMH of large hemorrhagic CVA in 2007 with residual speech difficulties.  The patient was alert/oriented to self & place on arrival to ER.  While in the room, he was witnessed to have altered speech and couldn't tell the staff where he was etc. He then had a seizure and was treated with 1mg  IV ativan.  He relaxed after about 30 seconds.   He was sent for CT head and while in the scanner he had an episode of ventricular tachycardia with arrest.  CPR was initiated and he was returned to the ER.  By the time he arrived to the ER, he was in a narrow complex rhythm without a pulse.  He was not shocked.  CPR total time ~ 10 minutes. Pt treated with amiodarone, EPI x2 and intubated without drugs. During intubation, he seized another time. Initial labs-Na 132, K 3.9, CL 99, CO2 22, glucose 206, BUN 10 / sr cr 0.85, Mg 1.8, WBC 7.9, Hgb 11.2 and platelets 231.  CXR pending.  CT of the head 7/27 showed no acute abnormality, moderate chronic small vessel disease, stable chronic ventriculomegaly compatible with left temporal occipital  encephalomalacia / central volume loss.   He was intubated from 08/09/2017-08/16/2017.  Most recent chest xray is showing no active airspace disease.  MRI of the head dated 08/14/2017 is whosing symmetric increased FLAIR and diffusion signoal abnormality involving the medial temporal lobes/hippocampi billaterally most likely sequelae of seizures, 6mm infarct right parietal lobe and sequelae of remote hemorrhagic infarct left parietal/temporal regions as well as mutiple chronic microhemorrhages.   Type of Study: Bedside Swallow Evaluation Previous Swallow Assessment: None noted at [facility]. Diet Prior to this Study: NPO Temperature Spikes Noted: Yes History of Recent Intubation: Yes Length of Intubations (days): 8 days Date extubated: 08/16/17 Behavior/Cognition: Alert;Cooperative;Requires cueing Oral Cavity Assessment: Within Functional Limits Oral Care  Completed by SLP: No Self-Feeding Abilities: Total assist Patient Positioning: Upright in bed Baseline Vocal Quality: Low vocal intensity Volitional Cough: Weak Volitional Swallow: Unable to elicit    Oral/Motor/Sensory Function Overall Oral Motor/Sensory Function: Mild impairment Facial ROM: Reduced left;Suspected CN VII (facial) dysfunction Facial Symmetry: Abnormal symmetry left;Suspected CN VII (facial) dysfunction Facial Strength: Suspected CN VII (facial) dysfunction;Reduced left Lingual ROM: Reduced left;Suspected CN XII (hypoglossal) dysfunction Lingual Symmetry: Abnormal symmetry left;Suspected CN XII (hypoglossal) dysfunction Lingual Strength: Reduced;Suspected CN XII (hypoglossal) dysfunction Mandible: Impaired;Suspected CN V (Trigeminal) dysfunction   Ice Chips Ice chips: Impaired Presentation: Spoon Oral Phase Impairments: Impaired mastication Oral Phase Functional Implications: Prolonged oral transit Pharyngeal Phase Impairments: Suspected delayed Swallow   Thin Liquid Thin Liquid: Impaired Presentation: Spoon Oral Phase Impairments: Reduced labial seal Oral Phase Functional Implications: Left anterior spillage Pharyngeal  Phase Impairments: Suspected delayed Swallow;Throat Clearing - Immediate;Multiple swallows    Nectar Thick Nectar Thick Liquid: Not tested   Honey Thick Honey Thick Liquid: Not tested   Puree Puree: Impaired Presentation: Spoon Pharyngeal Phase Impairments: Suspected delayed Swallow;Multiple swallows;Throat Clearing - Immediate   Solid     Solid: Not tested     [name] Aguas, MA, CCC-SLP Acute Rehab SLP [phone] Fleet Contras 08/17/2017,9:03 AM

## 2017-08-17 NOTE — Progress Notes (Signed)
Increased precedex gtt to 0.624mcg/kg/hr due to increased agitation: patient confused, verbally and physically aggressive-kicking staff, pulling off monitoring equipment. Re-initiated bilateral ankle restraints for safety. MD made aware. Continuing to monitor.

## 2017-08-17 NOTE — Progress Notes (Signed)
CC: Seizure/ Status   PMH- Seizure, HTN, HLD, AFIB w/ RVR, NSTEMI, alcohol abuse, excessive use of benzos   Subjective  Awake. Extubated today and speaking  Family at bedside for support and update from neurological standpoint Discussed at bedside the risk associated with overuse of benzo's, and alcohol abuse   Objective: Current vital signs: BP 110/81   Pulse 85   Temp 98.9 F (37.2 C) (Axillary)   Resp 16   Ht '5\' 2"'$  (1.575 m)   Wt 72.1 kg (158 lb 15.2 oz)   SpO2 98%   BMI 29.07 kg/m  Vital signs in last 24 hours: Temp:  [98.9 F (37.2 C)-100.4 F (38 C)] 98.9 F (37.2 C) (08/04 0800) Pulse Rate:  [69-121] 85 (08/04 1100) Resp:  [11-26] 16 (08/04 1100) BP: (84-163)/(50-105) 110/81 (08/04 1100) SpO2:  [96 %-100 %] 98 % (08/04 1100) FiO2 (%):  [30 %-32 %] 32 % (08/03 1441) Weight:  [72.1 kg (158 lb 15.2 oz)] 72.1 kg (158 lb 15.2 oz) (08/04 0500)  Intake/Output from previous day: 08/03 0701 - 08/04 0700 In: 1622.2 [P.O.:3; I.V.:519.8; NG/GT:210; IV Piggyback:889.5] Out: 2125 [Urine:2125] Intake/Output this shift: Total I/O In: 352 [I.V.:64.5; Other:75; IV Piggyback:212.5] Out: 75 [Urine:75] Nutritional status:  Diet Order           Diet NPO time specified  Diet effective now         Neurologic Exam: Ment: Awake, Oriented to self, situation, unable to recite date, time or day of week, nor city. Followed all commands. CN: PERRL. EOMI. Face symmetric.  Motor: all ext's against antigravity, bilateral wrist restraints   Lab Results: Results for orders placed or performed during the hospital encounter of 08/09/17 (from the past 48 hour(s))  Glucose, capillary     Status: Abnormal   Collection Time: 08/15/17 11:45 AM  Result Value Ref Range   Glucose-Capillary 118 (H) 70 - 99 mg/dL   Comment 1 Capillary Specimen   Glucose, capillary     Status: Abnormal   Collection Time: 08/15/17  4:13 PM  Result Value Ref Range   Glucose-Capillary 118 (H) 70 - 99 mg/dL    Comment 1 Capillary Specimen   Glucose, capillary     Status: Abnormal   Collection Time: 08/15/17  7:21 PM  Result Value Ref Range   Glucose-Capillary 123 (H) 70 - 99 mg/dL  Heparin level (unfractionated)     Status: Abnormal   Collection Time: 08/15/17  7:34 PM  Result Value Ref Range   Heparin Unfractionated 0.19 (L) 0.30 - 0.70 IU/mL    Comment: (NOTE) If heparin results are below expected values, and patient dosage has  been confirmed, suggest follow up testing of antithrombin III levels. Performed at [facility], [place] [address], [place], [place] [address]   Glucose, capillary     Status: Abnormal   Collection Time: 08/16/17 12:08 AM  Result Value Ref Range   Glucose-Capillary 122 (H) 70 - 99 mg/dL  CBC with Differential/Platelet     Status: Abnormal   Collection Time: 08/16/17  2:40 AM  Result Value Ref Range   WBC 7.4 4.0 - 10.5 K/uL   RBC 3.13 (L) 4.22 - 5.81 MIL/uL   Hemoglobin 9.6 (L) 13.0 - 17.0 g/dL   HCT 30.5 (L) 39.0 - 52.0 %   MCV 97.4 78.0 - 100.0 fL   MCH 30.7 26.0 - 34.0 pg   MCHC 31.5 30.0 - 36.0 g/dL   RDW 15.9 (H) 11.5 - 15.5 %  Platelets 239 150 - 400 K/uL   Neutrophils Relative % 58 %   Neutro Abs 4.3 1.7 - 7.7 K/uL   Lymphocytes Relative 22 %   Lymphs Abs 1.6 0.7 - 4.0 K/uL   Monocytes Relative 16 %   Monocytes Absolute 1.2 (H) 0.1 - 1.0 K/uL   Eosinophils Relative 2 %   Eosinophils Absolute 0.2 0.0 - 0.7 K/uL   Basophils Relative 0 %   Basophils Absolute 0.0 0.0 - 0.1 K/uL   Immature Granulocytes 2 %   Abs Immature Granulocytes 0.1 0.0 - 0.1 K/uL    Comment: Performed at [facility] [address], [place], [place] [address]  Heparin level (unfractionated)     Status: None   Collection Time: 08/16/17  2:40 AM  Result Value Ref Range   Heparin Unfractionated 0.47 0.30 - 0.70 IU/mL    Comment: (NOTE) If heparin results are below expected values, and patient dosage has  been confirmed, suggest follow up testing of antithrombin  III levels. Performed at [facility], [place] [address] [place], [place] [address]   Glucose, capillary     Status: Abnormal   Collection Time: 08/16/17  3:55 AM  Result Value Ref Range   Glucose-Capillary 115 (H) 70 - 99 mg/dL  Glucose, capillary     Status: Abnormal   Collection Time: 08/16/17  8:38 AM  Result Value Ref Range   Glucose-Capillary 112 (H) 70 - 99 mg/dL  Heparin level (unfractionated)     Status: None   Collection Time: 08/16/17 10:42 AM  Result Value Ref Range   Heparin Unfractionated 0.40 0.30 - 0.70 IU/mL    Comment: (NOTE) If heparin results are below expected values, and patient dosage has  been confirmed, suggest follow up testing of antithrombin III levels. Performed at [facility], [place] [address], [place], [place] [address]   Glucose, capillary     Status: Abnormal   Collection Time: 08/16/17 12:14 PM  Result Value Ref Range   Glucose-Capillary 113 (H) 70 - 99 mg/dL  Glucose, capillary     Status: Abnormal   Collection Time: 08/16/17  3:40 PM  Result Value Ref Range   Glucose-Capillary 124 (H) 70 - 99 mg/dL  Glucose, capillary     Status: None   Collection Time: 08/16/17  8:58 PM  Result Value Ref Range   Glucose-Capillary 90 70 - 99 mg/dL  Glucose, capillary     Status: None   Collection Time: 08/16/17 11:51 PM  Result Value Ref Range   Glucose-Capillary 91 70 - 99 mg/dL  CBC with Differential/Platelet     Status: Abnormal   Collection Time: 08/17/17  3:05 AM  Result Value Ref Range   WBC 6.6 4.0 - 10.5 K/uL   RBC 2.93 (L) 4.22 - 5.81 MIL/uL   Hemoglobin 9.0 (L) 13.0 - 17.0 g/dL   HCT 29.1 (L) 39.0 - 52.0 %   MCV 99.3 78.0 - 100.0 fL   MCH 30.7 26.0 - 34.0 pg   MCHC 30.9 30.0 - 36.0 g/dL   RDW 15.5 11.5 - 15.5 %   Platelets 253 150 - 400 K/uL   Neutrophils Relative % 57 %   Neutro Abs 3.9 1.7 - 7.7 K/uL   Lymphocytes Relative 22 %   Lymphs Abs 1.5 0.7 - 4.0 K/uL   Monocytes Relative 14 %   Monocytes Absolute 0.9 0.1 - 1.0  K/uL   Eosinophils Relative 4 %   Eosinophils Absolute 0.2  0.0 - 0.7 K/uL   Basophils Relative 1 %   Basophils Absolute 0.0 0.0 - 0.1 K/uL   Immature Granulocytes 2 %   Abs Immature Granulocytes 0.1 0.0 - 0.1 K/uL    Comment: Performed at [facility] [address], [place], [place] [address]  Heparin level (unfractionated)     Status: None   Collection Time: 08/17/17  3:05 AM  Result Value Ref Range   Heparin Unfractionated 0.33 0.30 - 0.70 IU/mL    Comment: (NOTE) If heparin results are below expected values, and patient dosage has  been confirmed, suggest follow up testing of antithrombin III levels. Performed at [facility], [place] [address], [place], [place] [address]   Basic metabolic panel     Status: Abnormal   Collection Time: 08/17/17  3:05 AM  Result Value Ref Range   Sodium 143 135 - 145 mmol/L   Potassium 3.0 (L) 3.5 - 5.1 mmol/L   Chloride 104 98 - 111 mmol/L   CO2 27 22 - 32 mmol/L   Glucose, Bld 94 70 - 99 mg/dL   BUN 19 8 - 23 mg/dL   Creatinine, Ser 0.55 (L) 0.61 - 1.24 mg/dL   Calcium 8.5 (L) 8.9 - 10.3 mg/dL   GFR calc non Af Amer >60 >60 mL/min   GFR calc Af Amer >60 >60 mL/min    Comment: (NOTE) The eGFR has been calculated using the CKD EPI equation. This calculation has not been validated in all clinical situations. eGFR's persistently <60 mL/min signify possible Chronic Kidney Disease.    Anion gap 12 5 - 15    Comment: Performed at [facility] [address], [place], [place] [address]  Glucose, capillary     Status: Abnormal   Collection Time: 08/17/17  4:30 AM  Result Value Ref Range   Glucose-Capillary 109 (H) 70 - 99 mg/dL  Glucose, capillary     Status: None   Collection Time: 08/17/17  7:28 AM  Result Value Ref Range   Glucose-Capillary 77 70 - 99 mg/dL  Phenytoin level, total     Status: Abnormal   Collection Time: 08/17/17  9:42 AM  Result Value Ref Range   Phenytoin Lvl 3.4 (L) 10.0 - 20.0 ug/mL    Comment:  Performed at [facility] [address], [place], [place] [address]    Recent Results (from the past 240 hour(s))  MRSA PCR Screening     Status: None   Collection Time: 08/09/17  5:39 PM  Result Value Ref Range Status   MRSA by PCR NEGATIVE NEGATIVE Final    Comment:        The GeneXpert MRSA Assay (FDA approved for NASAL specimens only), is one component of a comprehensive MRSA colonization surveillance program. It is not intended to diagnose MRSA infection nor to guide or monitor treatment for MRSA infections. Performed at [facility], [place] [address], [place], [place] [address]   Culture, blood (Routine X 2) w Reflex to ID Panel     Status: None   Collection Time: 08/09/17  6:04 PM  Result Value Ref Range Status   Specimen Description BLOOD SITE NOT SPECIFIED  Final   Special Requests   Final    BOTTLES DRAWN AEROBIC AND ANAEROBIC Blood Culture adequate volume   Culture   Final    NO GROWTH 5 DAYS Performed at [facility], [address], [place], [place] [address]  Report Status 08/14/2017 FINAL  Final  Culture, blood (Routine X 2) w Reflex to ID Panel     Status: None   Collection Time: 08/10/17  8:31 AM  Result Value Ref Range Status   Specimen Description BLOOD RIGHT HAND  Final   Special Requests   Final    BOTTLES DRAWN AEROBIC AND ANAEROBIC Blood Culture adequate volume   Culture   Final    NO GROWTH 5 DAYS Performed at [facility], [place] [address], [place], [place] [address]    Report Status 08/15/2017 FINAL  Final  Culture, respiratory (non-expectorated)     Status: None   Collection Time: 08/14/17 11:08 AM  Result Value Ref Range Status   Specimen Description TRACHEAL ASPIRATE  Final   Special Requests NONE  Final   Gram Stain   Final    MODERATE WBC PRESENT, PREDOMINANTLY PMN FEW GRAM POSITIVE COCCI IN CLUSTERS RARE GRAM NEGATIVE RODS RARE GRAM NEGATIVE DIPLOCOCCI Performed at [facility], [address],  [place], [place] [address]    Culture ABUNDANT STAPHYLOCOCCUS AUREUS  Final   Report Status 08/16/2017 FINAL  Final   Organism ID, Bacteria STAPHYLOCOCCUS AUREUS  Final      Susceptibility   Staphylococcus aureus - MIC*    CIPROFLOXACIN <=0.5 SENSITIVE Sensitive     ERYTHROMYCIN <=0.25 SENSITIVE Sensitive     GENTAMICIN <=0.5 SENSITIVE Sensitive     OXACILLIN 0.5 SENSITIVE Sensitive     TETRACYCLINE <=1 SENSITIVE Sensitive     VANCOMYCIN 1 SENSITIVE Sensitive     TRIMETH/SULFA <=10 SENSITIVE Sensitive     CLINDAMYCIN <=0.25 SENSITIVE Sensitive     RIFAMPIN <=0.5 SENSITIVE Sensitive     Inducible Clindamycin NEGATIVE Sensitive     * ABUNDANT STAPHYLOCOCCUS AUREUS    Lipid Panel No results for input(s): CHOL, TRIG, HDL, CHOLHDL, VLDL, LDLCALC in the last 72 hours.  Studies/Results: Dg Chest Port 1 View  Result Date: 08/15/2017 CLINICAL DATA:  Endotracheal tube placement EXAM: PORTABLE CHEST 1 VIEW COMPARISON:  08/13/2017 chest radiograph FINDINGS: Unchanged position of endotracheal tube and nasogastric tube. Sequelae of remote CABG and median sternotomy. Cardiomediastinal contours are normal. No focal airspace consolidation or pulmonary edema. No pneumothorax or sizable pleural effusion. IMPRESSION: Unchanged appearance of support apparatus. No acute airspace disease. Electronically Signed   By: Ulyses Jarred M.D.   On: 08/15/2017 17:38    Medications:  Scheduled: . atorvastatin  80 mg Per Tube Daily  . famotidine  20 mg Oral QHS  . insulin aspart  0-9 Units Subcutaneous Q4H  . LORazepam  1 mg Intravenous Q8H  . phenytoin (DILANTIN) IV  100 mg Intravenous Q8H   Continuous: . dexmedetomidine (PRECEDEX) IV infusion 0.2 mcg/kg/hr (08/17/17 0800)  . heparin 1,550 Units/hr (08/17/17 1111)  . levETIRAcetam Stopped (08/17/17 0520)  . piperacillin-tazobactam (ZOSYN)  IV 12.5 mL/hr at 08/17/17 0800  . potassium chloride 10 mEq (08/17/17 1109)  . vancomycin Stopped (08/17/17 0356)     Assessment:Status epilepticus in the setting of seizure disorder following hemorrhagic stroke, EtOH abuse and recently having been taken off Dilantin.  1. Patient awake and alert this morning.   2. Covered with Dilantin at 100 mg q8h IV.Also on Keppra 1000 mg BID  3. Status post cardiac arrest. Per Cardiology, the initial event in the grocery store appears most likely to have been related to seizure. 4. Has been successfully weaned off Versed and Vimpat. Now on scheduled Ativan 1 mg IV q8h, Current order  for Precedex is for agitation and CIWA   Recommendations: 1.Start Ativan taper after 3 more days (on 8/6).  .  2. Dilantin at 100 mg q8h, current pheyntoin level low 3.4, Pharmacy to dose AED 3. Keppra 1000 mg BID should now be tapered as follows: 750 mg BID x 3 days, then 500 mg BID x 3 days, then 250 mg BID x 3 days, then 250 mg qd x 3 days, then stop. Can be tapered with either IV or PO forms at same doses as there is 1:1 oral:IV bioequivalency for this anticonvulsant.   4. D/c'd Vimpat.  5. Continue patient on Dilantin monotherapy.  6. Alcohol cessation- patient family inquiring about alcohol cessation programs- social work may have resources- please assist if possible  7. F/U with outpatient neurology 2-4 weeks  8. Neurology will sign off. Please call if there are additional questions.     35 minutes spent in the neurological evaluation and management of this critically ill patient. Time spent included patient and family education.    LOS: 8 days   '@Electronically'$  signed: Dr. Kerney Elbe 08/17/2017  11:30 AM

## 2017-08-17 NOTE — Plan of Care (Signed)
Patient remains on precedex gtt; increased agitation today requiring titration and 4 point restraints, no BM in at least 3 days, remains NPO d/t agitation and inability to cooperate for MBS. Unable to get patient OOBTC d/t agitation and for safety.

## 2017-08-17 NOTE — Progress Notes (Signed)
ANTICOAGULATION CONSULT NOTE   Pharmacy Consult for Heparin Indication: DVT  Patient Measurements: Height: 5\' 2"  (157.5 cm) Weight: 158 lb 15.2 oz (72.1 kg) IBW/kg (Calculated) : 54.6 HEPARIN DW (KG): 70.9   Vital Signs: Temp: 98.9 F (37.2 C) (08/04 1200) Temp Source: Axillary (08/04 1200) BP: 120/62 (08/04 1200) Pulse Rate: 84 (08/04 1200)  Medications:  Scheduled:  . atorvastatin  80 mg Per Tube Daily  . famotidine  20 mg Oral QHS  . insulin aspart  0-9 Units Subcutaneous Q4H  . LORazepam  1 mg Intravenous Q8H  . phenytoin (DILANTIN) IV  100 mg Intravenous Q8H   Infusions:  . dexmedetomidine (PRECEDEX) IV infusion 0.2 mcg/kg/hr (08/17/17 0800)  . heparin 1,550 Units/hr (08/17/17 1111)  . levETIRAcetam Stopped (08/17/17 0520)  . potassium chloride      Assessment: Pt found to have new DVT in left upper extremity. Pharmacy consulted to manage heparin therapy. Hemoglobin stable, pltc increasing. No bleeding noted. Heparin level therapeutic x3.   Goal of Therapy:  Heparin level 0.3-0.7 units/ml Monitor platelets by anticoagulation protocol: Yes   Plan:  -Continue heparin at 1550 units/hr -Daily HL, CBC   Fayne NorrieMillen, Lyliana Dicenso Brown 08/17/2017 12:54 PM

## 2017-08-17 NOTE — Progress Notes (Signed)
PULMONARY / CRITICAL CARE MEDICINE   Name: [name] Cruz MRN: [id number] DOB: 1955-05-12    ADMISSION DATE:  08/09/2017 CONSULTATION DATE:  08/09/2017  REFERRING MD:  Dr. Timoteo Expose  CHIEF COMPLAINT:  Seizure  HISTORY OF PRESENT ILLNESS:   This is a 62 y/o M who presented to [facility] on 7/27 via EMS with reports of seizures.   Per patients son, he was in line at [employer] and then had garbled speech, spun around and fell to the ground with seizure like activity. EMS reported the patient was post-ictal on arrival to scene. PMH of large hemorrhagic CVA in 2007 with residual speech difficulties. The patient was alert/oriented to self &place on arrival to ER. While in the room, he was witnessed to have altered speech and couldn't tell the staff where he was etc. He then had a seizure and was treated with 1mg  IV ativan. He relaxed after about 30 seconds.   He was sent for CT head and while in the scanner he had an episode of ventricular tachycardia with arrest. CPR was initiated and he was returned to the ER. By the time he arrived to the ER, he was in a narrow complex rhythm without a pulse. He was not shocked. CPR total time ~ 10 minutes. Pt treated with amiodarone, EPI x2 and intubated without drugs. During intubation, he seized another time. Initial labs-Na 132, K 3.9, CL 99, CO2 22, glucose 206, BUN 10 / sr cr 0.85, Mg 1.8, WBC 7.9, Hgb 11.2 and platelets 231. CXR pending. CT of the head 7/27 showed no acute abnormality, moderate chronic small vessel disease, stable chronic ventriculomegaly compatible with left temporal occipital encephalomalacia / central volume loss. PCCM called for ICU admission. Of note, on medication review, the patient last filled his phenytoin on 03/27/17 (takes 100mg  TID) for a three month supply. Raises question of running out of meds?? Family thinks he had been out for a "little while". Admitted for hypothermia protocol but patient was purposeful on arrival to  ICU, discontinued. Deep sedation initiated per Neuro for burst suppression.  PAST MEDICAL HISTORY :  He  has a past medical history of Cognitive impairment (2007), History of chicken pox, HLD (hyperlipidemia), HTN (hypertension), NSTEMI (non-ST elevated myocardial infarction) (HCC) (02/21/2017), Obesity, and Stroke, hemorrhagic (HCC) (2007).  PAST SURGICAL HISTORY: He  has a past surgical history that includes Ankle surgery (1990s); LEFT HEART CATH AND CORONARY ANGIOGRAPHY (N/A, 02/21/2017); IABP Insertion (N/A, 02/21/2017); Coronary artery bypass graft (N/A, 02/24/2017); and TEE without cardioversion (N/A, 02/24/2017).  No Known Allergies  No current facility-administered medications on file prior to encounter.    Current Outpatient Medications on File Prior to Encounter  Medication Sig  . aspirin EC 325 MG EC tablet Take 1 tablet (325 mg total) by mouth daily.  Marland Kitchen atorvastatin (LIPITOR) 80 MG tablet TAKE 1 TABLET (80 MG TOTAL) BY MOUTH DAILY. FOR CHOLESTEROL (Patient taking differently: Take 80 mg by mouth daily. FOR CHOLESTEROL)  . carvedilol (COREG) 3.125 MG tablet Take 1 tablet (3.125 mg total) by mouth 2 (two) times daily with a meal.  . ezetimibe (ZETIA) 10 MG tablet Take 1 tablet (10 mg total) by mouth daily.  Marland Kitchen losartan (COZAAR) 25 MG tablet Take 1 tablet (25 mg total) by mouth daily.  Marland Kitchen oxyCODONE (OXY IR/ROXICODONE) 5 MG immediate release tablet Take 1-2 tablets (5-10 mg total) by mouth every 6 (six) hours as needed for severe pain. (Patient not taking: Reported on 07/12/2017)  . phenytoin (DILANTIN) 100  MG ER capsule Take 100 mg by mouth 3 (three) times daily.    FAMILY HISTORY:  His family history includes Alcohol abuse in his father; Alzheimer's disease in his maternal grandfather; Cancer in his mother. There is no history of Coronary artery disease, Stroke, or Diabetes.  SOCIAL HISTORY: He  reports that he has never smoked. He has quit using smokeless tobacco. He reports that he  drinks about 8.4 oz of alcohol per week. He reports that he does not use drugs.  SUBJECTIVE:  Extubated yesterday with no current c/o. RN reports that he becomes verbally and physically aggressive when Precedex is weaned  VITAL SIGNS: BP 120/62   Pulse 84   Temp 98.9 F (37.2 C) (Axillary)   Resp 12   Ht 5\' 2"  (1.575 m)   Wt 72.1 kg (158 lb 15.2 oz)   SpO2 99%   BMI 29.07 kg/m   HEMODYNAMICS:   INTAKE / OUTPUT: I/O last 3 completed shifts: In: 3379.4 [P.O.:3; I.V.:750.7; NG/GT:210; IV Piggyback:2415.7] Out: 2750 [Urine:2750]  PHYSICAL EXAMINATION: General:  WD/WN WM NAD Neuro:  Alert. No focal deficits HEENT:  [place]/AT, PERRL EOMI; O-P benign except dentures Cardiovascular:  RRR, no M/R/G Lungs:  Clear except upper airway noises Abdomen:  Supple, NT, +BS Musculoskeletal:  No active joints Skin:  No C/C/E  LABS:  BMET Recent Labs  Lab 08/13/17 0339 08/14/17 0310 08/17/17 0305  NA 143 144 143  K 3.8 3.5 3.0*  CL 106 106 104  CO2 26 28 27   BUN 10 13 19   CREATININE 0.55* 0.59* 0.55*  GLUCOSE 141* 146* 94    Electrolytes Recent Labs  Lab 08/13/17 0339 08/14/17 0310 08/17/17 0305  CALCIUM 7.7* 8.0* 8.5*  MG  --  2.1  --     CBC Recent Labs  Lab 08/15/17 0507 08/16/17 0240 08/17/17 0305  WBC 7.3 7.4 6.6  HGB 8.8* 9.6* 9.0*  HCT 28.5* 30.5* 29.1*  PLT 219 239 253    Coag's No results for input(s): APTT, INR in the last 168 hours.  Sepsis Markers No results for input(s): LATICACIDVEN, PROCALCITON, O2SATVEN in the last 168 hours.  ABG Recent Labs  Lab 08/12/17 0631  PHART 7.365  PCO2ART 39.2  PO2ART 89.0    Liver Enzymes Recent Labs  Lab 08/11/17 0418  AST 51*  ALT 41  ALKPHOS 58  BILITOT 0.7  ALBUMIN 2.6*    Cardiac Enzymes Recent Labs  Lab 08/12/17 0813  TROPONINI 0.05*    Glucose Recent Labs  Lab 08/16/17 1540 08/16/17 2058 08/16/17 2351 08/17/17 0430 08/17/17 0728 08/17/17 1204  GLUCAP 124* 90 91 109* 77 79     Imaging No results found.   CULTURES: BC neg x 5d Tracheal aspirate grew S. Aureus - likely colonization  ANTIBIOTICS: Zosyn Vanc  LINES/TUBES: None  DISCUSSION: 62 y/o M admitted after seizure. While in CT, he suffered a cardiac arrest with suspected VT as initial rhythm. Arrest thought to be ~ 10 minutes. He was transferred back to the ER with CPR in progress from the CT scanner and on arrival had a narrow complex rhythm with no pulse. Intubated without drugs. Seized during intubation. Initially was to undergo hypothermia protocol but after arriving to ICU from ER, was thought to be purposeful and hypothermia protocol discontinued. He has had continuous EEG with persistent seizures despite propofol/versed. Suppression achieved 7/29. Propofol off 7/30. Versed gtt stopped 7/31.  Extubated 8/3  ASSESSMENT / PLAN:  PULMONARY A: Sats OK on RA P:  Follow  CARDIOVASCULAR A:  S/p CP arrest P:  Continue losartan, Coreg, atorvastatin hold Zetia Monitor changes in QT on telemetry  RENAL A:   Hypokalemia P:   Replace via IV  GASTROINTESTINAL A:   No acute issues P:   Speech path plans to assess swallowing before po feeds  HEMATOLOGIC A:   Dorrington/Kemper anemia - stable P:  Follow  INFECTIOUS A:   No obvious infection. Antibiotics started fro presumed aspiration pneumonia. S. Aureus in tracheal aspirate likely colonization as WBC nl and CXR clear. P:   D/C Abx  ENDOCRINE A:   Hyperglycemia corrected   P:   SSI  NEUROLOGIC A:   Resolved status epilepticus P:   Continue Vimpat, keppra, Dilantin per Neurol Probably can go to step-down tomorrow   Critical care time: 30 min   Pulmonary and Critical Care Medicine [facility] Pager: [phone]  08/17/2017, 12:21 PM

## 2017-08-17 NOTE — Progress Notes (Signed)
Dr. Roselyn Beringrim made aware patient has 1 IV access infusing heparin gtt and precedex gtt. Still receiving scheduled IV ativan, keppra and dilantin. Orders received for PICC line. Also made aware patient has not had a BM in at least 3 days. Pt remains NPO until MBS. Continuing to monitor.

## 2017-08-17 NOTE — Progress Notes (Signed)
[facility] FOR AM LAB REPLACEMENT ONLY  The patient does apply for the [facility] Electrolyte [facility] Protocol based on the criteria listed below:   1. Is GFR >/= 40 ml/min? Yes.    Patient's GFR today is >60 2. Is urine output >/= 0.5 ml/kg/hr for the last 6 hours? Yes.   Patient's UOP is 0.7 ml/kg/hr 3. Is BUN < 60 mg/dL? Yes.    Patient's BUN today is 19  4. Abnormal electrolyte(s):k 3.0 5. Ordered repletion with: protocol 6. If a panic level lab has been reported, has the CCM MD in charge been notified? No..   Physician:    Markus DaftWHELAN, Dalessandro Baldyga A 08/17/2017 6:49 AM

## 2017-08-17 NOTE — Progress Notes (Signed)
SLP Cancellation Note  Patient Details Name: [name] Cruz J Ratchford MRN: [id number] DOB: 1955/10/25   Cancelled treatment:       Reason Eval/Treat Not Completed: Other (comment)  Patient confused and not able to go to radiology per nursing.  ST will follow up next date for readiness for MBS.  Dimas AguasMelissa Pamela Maddy, MA, CCC-SLP Acute Rehab SLP [phone]  Fleet ContrasMelissa N Johnpatrick Jenny 08/17/2017, 1:50 PM

## 2017-08-18 ENCOUNTER — Inpatient Hospital Stay (HOSPITAL_COMMUNITY): Payer: Medicare Other

## 2017-08-18 LAB — CBC WITH DIFFERENTIAL/PLATELET
Abs Immature Granulocytes: 0.2 10*3/uL — ABNORMAL HIGH (ref 0.0–0.1)
Basophils Absolute: 0.1 10*3/uL (ref 0.0–0.1)
Basophils Relative: 1 %
EOS PCT: 3 %
Eosinophils Absolute: 0.2 10*3/uL (ref 0.0–0.7)
HEMATOCRIT: 31.6 % — AB (ref 39.0–52.0)
HEMOGLOBIN: 10 g/dL — AB (ref 13.0–17.0)
Immature Granulocytes: 3 %
LYMPHS ABS: 1.8 10*3/uL (ref 0.7–4.0)
LYMPHS PCT: 25 %
MCH: 31.1 pg (ref 26.0–34.0)
MCHC: 31.6 g/dL (ref 30.0–36.0)
MCV: 98.1 fL (ref 78.0–100.0)
MONO ABS: 0.7 10*3/uL (ref 0.1–1.0)
MONOS PCT: 10 %
Neutro Abs: 4.4 10*3/uL (ref 1.7–7.7)
Neutrophils Relative %: 58 %
Platelets: 305 10*3/uL (ref 150–400)
RBC: 3.22 MIL/uL — AB (ref 4.22–5.81)
RDW: 14.9 % (ref 11.5–15.5)
WBC: 7.3 10*3/uL (ref 4.0–10.5)

## 2017-08-18 LAB — GLUCOSE, CAPILLARY
GLUCOSE-CAPILLARY: 80 mg/dL (ref 70–99)
GLUCOSE-CAPILLARY: 70 mg/dL (ref 70–99)
GLUCOSE-CAPILLARY: 62 mg/dL — AB (ref 70–99)
GLUCOSE-CAPILLARY: 70 mg/dL (ref 70–99)
GLUCOSE-CAPILLARY: 96 mg/dL (ref 70–99)
Glucose-Capillary: 54 mg/dL — ABNORMAL LOW (ref 70–99)
Glucose-Capillary: 70 mg/dL (ref 70–99)
Glucose-Capillary: 91 mg/dL (ref 70–99)
Glucose-Capillary: 95 mg/dL (ref 70–99)

## 2017-08-18 LAB — BASIC METABOLIC PANEL
Anion gap: 9 (ref 5–15)
BUN: 16 mg/dL (ref 8–23)
CHLORIDE: 106 mmol/L (ref 98–111)
CO2: 27 mmol/L (ref 22–32)
Calcium: 8.6 mg/dL — ABNORMAL LOW (ref 8.9–10.3)
Creatinine, Ser: 0.57 mg/dL — ABNORMAL LOW (ref 0.61–1.24)
GFR calc Af Amer: >60 mL/min (ref >60)
GFR calc non Af Amer: >60 mL/min (ref >60)
Glucose, Bld: 77 mg/dL (ref 70–99)
POTASSIUM: 3.5 mmol/L (ref 3.5–5.1)
SODIUM: 142 mmol/L (ref 135–145)

## 2017-08-18 LAB — HEPARIN LEVEL (UNFRACTIONATED): Heparin Unfractionated: 0.39 IU/mL (ref 0.30–0.70)

## 2017-08-18 MED ORDER — DEXTROSE-NACL 5-0.45 % IV SOLN
INTRAVENOUS | Status: DC
  Administered 2017-08-18: 16:00:00 via INTRAVENOUS

## 2017-08-18 MED ORDER — SODIUM CHLORIDE 0.9% FLUSH: 10.0000 mL | INTRAVENOUS | Status: DC | PRN

## 2017-08-18 MED ORDER — ENOXAPARIN SODIUM 80 MG/0.8ML ~~LOC~~ SOLN
1.0000 mg/kg | Freq: Two times a day (BID) | SUBCUTANEOUS | Status: DC
  Administered 2017-08-18 – 2017-08-19 (×2): 70 mg via SUBCUTANEOUS
  Filled 2017-08-18 (×2): qty 0.8

## 2017-08-18 MED ORDER — VITAMIN B-1 100 MG PO TABS
100.0000 mg | ORAL_TABLET | Freq: Every day | ORAL | Status: DC
  Administered 2017-08-18 – 2017-08-22 (×5): 100 mg via ORAL
  Filled 2017-08-18 (×5): qty 1

## 2017-08-18 MED ORDER — LORAZEPAM 1 MG PO TABS
0.5000 mg | ORAL_TABLET | Freq: Two times a day (BID) | ORAL | Status: AC
  Administered 2017-08-18 – 2017-08-20 (×4): 0.5 mg via ORAL
  Filled 2017-08-18 (×4): qty 1

## 2017-08-18 MED ORDER — SODIUM CHLORIDE 0.9% FLUSH
10.0000 mL | Freq: Two times a day (BID) | INTRAVENOUS | Status: DC
  Administered 2017-08-18 – 2017-08-19 (×3): 10 mL
  Administered 2017-08-20: 20 mL
  Administered 2017-08-20 – 2017-08-22 (×4): 10 mL

## 2017-08-18 MED ORDER — LEVETIRACETAM 500 MG PO TABS
1000.0000 mg | ORAL_TABLET | Freq: Two times a day (BID) | ORAL | Status: DC
  Administered 2017-08-18 – 2017-08-22 (×8): 1000 mg via ORAL
  Filled 2017-08-18 (×8): qty 2

## 2017-08-18 MED ORDER — ENSURE ENLIVE PO LIQD
237.0000 mL | ORAL | Status: DC
  Administered 2017-08-18 – 2017-08-21 (×4): 237 mL via ORAL

## 2017-08-18 MED ORDER — PHENYTOIN SODIUM EXTENDED 100 MG PO CAPS
100.0000 mg | ORAL_CAPSULE | Freq: Three times a day (TID) | ORAL | Status: DC
  Administered 2017-08-18: 100 mg via ORAL
  Filled 2017-08-18 (×3): qty 1

## 2017-08-18 MED ORDER — ADULT MULTIVITAMIN W/MINERALS CH
1.0000 | ORAL_TABLET | Freq: Every day | ORAL | Status: DC
Start: 2017-08-18 — End: 2017-08-22
  Administered 2017-08-18 – 2017-08-22 (×5): 1 via ORAL
  Filled 2017-08-18 (×5): qty 1

## 2017-08-18 NOTE — Progress Notes (Signed)
ANTICOAGULATION CONSULT NOTE   Pharmacy Consult for Heparin Indication: DVT  Patient Measurements: Height: 5\' 2"  (157.5 cm) Weight: 158 lb 11.7 oz (72 kg) IBW/kg (Calculated) : 54.6 HEPARIN DW (KG): 70.9   Vital Signs: Temp: 98.5 F (36.9 C) (08/05 0400) Temp Source: Oral (08/05 0400) BP: 134/76 (08/05 0700) Pulse Rate: 106 (08/05 0700)  Medications:  Scheduled:  . atorvastatin  80 mg Per Tube Daily  . famotidine  20 mg Oral QHS  . insulin aspart  0-9 Units Subcutaneous Q4H  . LORazepam  1 mg Intravenous Q8H  . phenytoin (DILANTIN) IV  100 mg Intravenous Q8H   Infusions:  . dexmedetomidine (PRECEDEX) IV infusion 0.4 mcg/kg/hr (08/18/17 0600)  . heparin 1,550 Units/hr (08/18/17 0641)  . levETIRAcetam Stopped (08/18/17 0522)    Assessment: Pt found to have new DVT in left upper extremity. Pharmacy consulted to manage heparin therapy. Hemoglobin and pltc increasing. No bleeding noted. Heparin level remains therapeutic. Heparin infusion was paused briefly today for PICC line placement    Goal of Therapy:  Heparin level 0.3-0.7 units/ml Monitor platelets by anticoagulation protocol: Yes   Plan:  -Continue heparin at 1550 units/hr -Daily HL, CBC   Vinnie LevelBenjamin Hazley Dezeeuw, PharmD., BCPS Clinical Pharmacist Clinical phone for 08/18/17 until 3:30pm: [phone] If after 3:30pm, [facility] for unit-specific pharmacist

## 2017-08-18 NOTE — Progress Notes (Signed)
Pulmonary/critical care medicine  Reason for admission: Seizure and cardiac arrest  Subjective/overnight events:  Extubated over the weekend.  Continued agitation requiring dexmedetomidine infusion which was discontinued this morning.  According to the nurse the patient is calm and cooperative but remains disoriented.  Synopsis of present illness and course in hospital:  This 62 year old man was admitted on 7/27 with reports of seizures. Per patients son, he was in line at [employer] and then had garbled speech, spun around and fell to the ground with seizure like activity. EMS reported the patient was post-ictal on arrival to scene.  He was initially alert and oriented on arrival to the ED but then had a witnessed generalized tonic-clonic seizure lasting 30 seconds treated with Ativan.  He had a ventricular tachycardia cardiac arrest with CPR in the CT scanner treated with amiodarone and epi.  No cardioversion.  Return of circulation after 10 minutes.  Cardiology consulted.  Prolonged QT noted.  Arrest felt to be secondary to seizure.  Started on IV sedation and anticonvulsants to achieve burst suppression.  Sedation discontinued 7/30.  Patient's mental status allowed extubation 8/3.  Past Medical History:  Diagnosis Date  . Cognitive impairment 2007   after stroke, saw rehab but told to stop because was too upsetting to him  . History of chicken pox   . HLD (hyperlipidemia)   . HTN (hypertension)   . NSTEMI (non-ST elevated myocardial infarction) (HCC) 02/21/2017  . Obesity   . Stroke, hemorrhagic (HCC) 2007   thought 2/2 HTN (240sbp); residual cognitive impairment, loss of R peripheral field, no driving Developed seizures thereafter.  Phenytoin recently stopped.   Objective assessment:  Vitals:   08/18/17 1500 08/18/17 1600  BP: 136/90 131/81  Pulse:    Resp: 18 19  Temp:    SpO2: 95% on room air    Physical Exam  Constitutional:  Appears in poor health.  Ill kempt  HENT:   Head: Normocephalic and atraumatic.  Nose: Nose normal.  Mouth/Throat: No oropharyngeal exudate.  Eyes: Conjunctivae are normal. No scleral icterus.  Neck: Neck supple. No JVD present. No tracheal deviation present.  Cardiovascular: Normal rate, normal heart sounds and intact distal pulses.  Pulmonary/Chest: Breath sounds normal. No stridor. No respiratory distress. He has no rales.  Abdominal: Soft. Bowel sounds are normal.  Genitourinary:  Genitourinary Comments: Foley catheter in place  Musculoskeletal: He exhibits no edema.  Neurological:  Awake but somnolent after receiving Ativan.  Calm and cooperative.  Oriented only to person.  Answers by getting his birthday when asked more complicated questions.  Moves all limbs symmetrically.  Skin: Skin is warm and dry. Capillary refill takes less than 2 seconds.  Left forearm shows area of induration consistent with extravasated IV.    Problem list:   Status epilepticus due to anticonvulsant withdrawal in context of prior intracerebral hemorrhage.   No further seizure activity appears well-controlled on Keppra.    Switch to oral anticonvulsants.  Monitor for seizure recurrence.   Acute confusional state on background of cognitive impairment.  Disorientation and inattention consistent with delirium.  Limit sedative medications.  Frequent reorientation.  Early ambulation.  Thiamine supplementation.  Behavior is well controlled with low-dose lorazepam-initiate taper.   Status post VT cardiac arrest.  Echocardiogram on this admission 7/28:  Study Conclusions  - Left ventricle: The cavity size was normal. There was mild focal   basal hypertrophy of the septum. Systolic function was mildly   reduced. The estimated ejection fraction was in  the range of 45%   to 50%. Mild diffuse hypokinesis. The study is not technically   sufficient to allow evaluation of LV diastolic function. - Aortic valve: Moderately to severely calcified  annulus.   Trileaflet; normal thickness, moderately calcified leaflets.   There was mild regurgitation. - Pulmonary arteries: PA peak pressure: 32 mm Hg (S). - Inferior vena cava: The vessel was mildly dilated.  EKG shows no QT prolongation (reviewed personally)  Arrest in the context of seizures.  No secondary prevention deemed necessary.  No recurrent arrhythmias since.  Will manage expectantly.   Acute left upper extremity DVT.  Venous Doppler performed 8/1 shows acute DVT of basilic vein.  Likely related to venous catheterization.  CBC Latest Ref Rng & Units 08/18/2017 08/17/2017 08/16/2017  WBC 4.0 - 10.5 K/uL 7.3 6.6 7.4  Hemoglobin 13.0 - 17.0 g/dL 10.0(L) 9.0(L) 9.6(L)  Hematocrit 39.0 - 52.0 % 31.6(L) 29.1(L) 30.5(L)  Platelets 150 - 400 K/uL 305 253 239   Results for CRUZ, BONG (MRN [id number]) as of 08/18/2017 17:11  Ref. Range 08/15/2017 19:34 08/16/2017 02:40 08/16/2017 10:42 08/17/2017 03:05 08/18/2017 03:11  Heparin Unfractionated Latest Ref Range: 0.30 - 0.70 IU/mL 0.19 (L) 0.47 0.40 0.33 0.39    Continue anticoagulation for 3 months for provoked DVT - can switch to therapeutic Lovenox at this time.   Hypoglycemia related to poor nutritional status.  Results for MORRELL, FLUKE (MRN [id number]) as of 08/18/2017 17:11  Ref. Range 08/18/2017 05:11 08/18/2017 08:05 08/18/2017 11:56 08/18/2017 15:35 08/18/2017 16:03  Glucose-Capillary Latest Ref Range: 70 - 99 mg/dL 91 80 70 62 (L) 70    Patient has been cleared for oral intake but quantities are limited.  IV D5 1/2 normal saline at 50 mL an hour until oral intake and urine output improve.  Quality measures:  VAP bundle: Not applicable. Sedation reduction: Not applicable. Secondary cardiovascular prevention: Will restart aspirin and initiate statin. DVT prophylaxis: On systemic heparin for acute left upper extremity DVT. Stress ulcer prophylaxis: Not applicable. Enteral nutrition: Has progressed to dysphagia 3 diet.  SLP  following. Early mobility: Up to chair today.  Progressive ambulation. Lines and drains: Marginal urine output.  Will maintain Foley catheter until tomorrow morning. Antibiotic de-escalation: On no antimicrobials. Sleep hygiene: Decrease neuro vital frequency.  Lynnell Catalan, MD [facility] ICU Physician [facility]  Pager: [phone] Mobile: [phone] After hours: [phone].

## 2017-08-18 NOTE — Progress Notes (Signed)
Peripherally Inserted Central Catheter/Midline Placement  The IV Nurse has discussed with the patient and/or persons authorized to consent for the patient, the purpose of this procedure and the potential benefits and risks involved with this procedure.  The benefits include less needle sticks, lab draws from the catheter, and the patient may be discharged home with the catheter. Risks include, but not limited to, infection, bleeding, blood clot (thrombus formation), and puncture of an artery; nerve damage and irregular heartbeat and possibility to perform a PICC exchange if needed/ordered by physician.  Alternatives to this procedure were also discussed.  Bard Power PICC patient education guide, fact sheet on infection prevention and patient information card has been provided to patient /or left at bedside.    PICC/Midline Placement Documentation    Consent obtained via telephone with sister Augusto GarbeBrenda Gilmore .    Franne Gripewman, Tarrin Menn Renee 08/18/2017, 9:42 AM

## 2017-08-18 NOTE — Evaluation (Signed)
Physical Therapy Evaluation Patient Details Name: [name] Cruz MRN: [id number] DOB: 09-29-1955 Today's Date: 08/18/2017   History of Present Illness  62 y/o M who presented to [facility] on 7/27 via EMS with reports of seizures.  Per patients son, he was in line at [employer] and then had garbled speech, spun around and fell to the ground with seizure like activity.  EMS reported the patient was post-ictal on arrival to scene.  PMH of large hemorrhagic CVA in 2007 with residual speech difficulties.  The patient was alert/oriented to self & place on arrival to ER.  While in the room, he was witnessed to have altered speech and couldn't tell the staff where he was etc. He then had a seizure and was treated with 1mg  IV ativan.  He relaxed after about 30 seconds.   He was sent for CT head and while in the scanner he had an episode of ventricular tachycardia with arrest.  CPR was initiated and he was returned to the ER.  By the time he arrived to the ER, he was in a narrow complex rhythm without a pulse.  He was not shocked.  CPR total time ~ 10 minutes. CT of the head 7/27 showed no acute abnormality, moderate chronic small vessel disease, stable chronic ventriculomegaly compatible with left temporal occipital encephalomalacia / central volume loss.   He was intubated from 08/09/2017-08/16/2017.  Most recent chest xray is showing no active airspace disease.  MRI of the head dated 08/14/2017 is whosing symmetric increased FLAIR and diffusion signoal abnormality involving the medial temporal lobes/hippocampi billaterally most likely sequelae of seizures, 6mm infarct right parietal lobe and sequelae of remote hemorrhagic infarct left parietal/temporal regions as well as mutiple chronic microhemorrhages.    Clinical Impression  Pt admitted with above. Pt with both expressive and receptive aphasia, impaired balance, generalized weakness, delayed processing, impaired comprehension and command follow. Pt unsafe to return home  and would highly benefit from CIR upon d/c to achieve maximal functional recovery as pt was indep PTA.     Follow Up Recommendations CIR    Equipment Recommendations  (TBD)    Recommendations for Other Services Rehab consult     Precautions / Restrictions Precautions Precautions: Fall Precaution Comments: confusion, impaired cognition Restrictions Weight Bearing Restrictions: No      Mobility  Bed Mobility Overal bed mobility: Needs Assistance Bed Mobility: Rolling;Sidelying to Sit Rolling: Min assist Sidelying to sit: Min assist       General bed mobility comments: max directional verbal cues and tactile cues to complete task  Transfers Overall transfer level: Needs assistance Equipment used: None Transfers: Sit to/from Stand Sit to Stand: Min assist;+2 physical assistance;+2 safety/equipment         General transfer comment: pt slightly impulsive for standing but requiried minA to power up. Pt with wide base of support and unsteady upon standing  Ambulation/Gait Ambulation/Gait assistance: Mod assist;+2 safety/equipment Gait Distance (Feet): 130 Feet Assistive device: 1 person hand held assist Gait Pattern/deviations: Step-through pattern;Decreased stride length;Wide base of support;Staggering left;Staggering right Gait velocity: decreased Gait velocity interpretation: <1.31 ft/sec, indicative of household ambulator General Gait Details: modA to maintain balance and amb in straight line, tactile cues to posterior R hip for directional navigation as pt with difficulty following commands  Stairs            Wheelchair Mobility    Modified Rankin (Stroke Patients Only) Modified Rankin (Stroke Patients Only) Pre-Morbid Rankin Score: Slight disability Modified Rankin: Moderately severe  disability     Balance Overall balance assessment: Needs assistance Sitting-balance support: No upper extremity supported;Feet supported Sitting balance-Leahy Scale:  Fair     Standing balance support: Single extremity supported Standing balance-Leahy Scale: Poor Standing balance comment: pt with strong lateral sway L/R requiring external support for stability                             Pertinent Vitals/Pain Pain Assessment: No/denies pain    Home Living Family/patient expects to be discharged to:: Private residence Living Arrangements: Other relatives(oldest sister) Available Help at Discharge: Family;Friend(s);Available 24 hours/day Type of Home: Mobile home Home Access: Stairs to enter Entrance Stairs-Rails: Can reach both Entrance Stairs-Number of Steps: 3 Home Layout: One level Home Equipment: None      Prior Function Level of Independence: Independent         Comments: doesn't drive     Hand Dominance   Dominant Hand: Right    Extremity/Trunk Assessment   Upper Extremity Assessment Upper Extremity Assessment: Generalized weakness(bilat shld flex to 90 deg, PLOF)    Lower Extremity Assessment Lower Extremity Assessment: Generalized weakness    Cervical / Trunk Assessment Cervical / Trunk Assessment: Normal  Communication   Communication: Expressive difficulties  Cognition Arousal/Alertness: Awake/alert Behavior During Therapy: Flat affect Overall Cognitive Status: Impaired/Different from baseline Area of Impairment: Orientation;Attention;Memory;Following commands;Safety/judgement;Awareness;Problem solving                 Orientation Level: Disoriented to;Time(with verbal cues pt able to state [place], [place], [place]) Current Attention Level: Focused Memory: Decreased short-term memory Following Commands: Follows one step commands with increased time;Follows one step commands inconsistently;Follows multi-step commands inconsistently Safety/Judgement: Decreased awareness of safety;Decreased awareness of deficits Awareness: Intellectual(no idea he had a BM) Problem Solving: Slow processing;Decreased  initiation;Difficulty sequencing;Requires verbal cues;Requires tactile cues General Comments: pt very slow to respond, perseverates, impaired comprehension limiting MMT ability      General Comments General comments (skin integrity, edema, etc.): VSS, pt with BM, dependent for hygiene    Exercises     Assessment/Plan    PT Assessment Patient needs continued PT services  PT Problem List Decreased strength;Decreased range of motion;Decreased activity tolerance;Decreased balance;Decreased mobility;Decreased coordination;Decreased cognition;Decreased knowledge of use of DME;Decreased safety awareness       PT Treatment Interventions DME instruction;Gait training;Stair training;Functional mobility training;Therapeutic activities;Therapeutic exercise;Balance training;Neuromuscular re-education;Cognitive remediation;Patient/family education    PT Goals (Current goals can be found in the Care Plan section)  Acute Rehab PT Goals Patient Stated Goal: didn't state PT Goal Formulation: With patient Time For Goal Achievement: 09/01/17 Potential to Achieve Goals: Fair    Frequency Min 4X/week   Barriers to discharge        Co-evaluation               AM-PAC PT "6 Clicks" Daily Activity  Outcome Measure Difficulty turning over in bed (including adjusting bedclothes, sheets and blankets)?: Unable Difficulty moving from lying on back to sitting on the side of the bed? : Unable Difficulty sitting down on and standing up from a chair with arms (e.g., wheelchair, bedside commode, etc,.)?: Unable Help needed moving to and from a bed to chair (including a wheelchair)?: A Little Help needed walking in hospital room?: A Lot Help needed climbing 3-5 steps with a railing? : A Lot 6 Click Score: 10    End of Session Equipment Utilized During Treatment: Gait belt Activity Tolerance: Patient tolerated treatment well  Patient left: in chair;with call bell/phone within reach;with chair alarm  set Nurse Communication: Mobility status PT Visit Diagnosis: Unsteadiness on feet (R26.81)    Time: 1610-9604 PT Time Calculation (min) (ACUTE ONLY): 31 min   Charges:   PT Evaluation $PT Eval Moderate Complexity: 1 Mod PT Treatments $Gait Training: 8-22 mins        Lewis Shock, PT, DPT Pager #: [phone] Office #: [phone]   Minie Roadcap M Nikoleta Dady 08/18/2017, 2:47 PM

## 2017-08-18 NOTE — Progress Notes (Signed)
Rehab Admissions Coordinator Note:  Per PT recommendation, patient was screened by Nanine MeansKelly Stepfanie Yott for appropriateness for an Inpatient Acute Rehab Consult.  At this time, we are recommending Inpatient Rehab consult. AC will contact MD for requested rehab consult order. Please call if questions.   Nanine MeansKelly Lunna Vogelgesang 08/18/2017, 5:57 PM  I can be reached at [phone]

## 2017-08-18 NOTE — Progress Notes (Signed)
Notified Dr Denese KillingsAgarwala that patient had only voided 300cc since 0700 and patients CBG: 62. Patient received some apple juice along with pudding and CBG was 70. Patient was not having any complaints and stated he felt fine. Dr Denese KillingsAgarwala ordered D5-0.45%NS @ 50cc/hr. Also spoke with Dr Denese KillingsAgarwala about pt's foley catheter still being in place. Dr Denese KillingsAgarwala ordered to leave foley catheter in place until tomorrow. Will continue to monitor patient.

## 2017-08-18 NOTE — Progress Notes (Signed)
Nutrition Follow-up  DOCUMENTATION CODES:   Obesity unspecified  INTERVENTION:   Ensure Enlive po daily, each supplement provides 350 kcal and 20 grams of protein   NUTRITION DIAGNOSIS:   Inadequate oral intake related to dysphagia as evidenced by (diet just advanced to dysphagia III with thin liquids). Ongoing.   GOAL:   Patient will meet greater than or equal to 90% of their needs Progressing.   MONITOR:   PO intake  ASSESSMENT:   Patient with PMH significant for CVA with residual speech difficulties, HTN, HLD, and MI. Presents this admission with altered mental status and seizure-like activity. Pt was sent to CT head and while in the scanner had an episode of ventricular tachycardia with arrest.  CPR was initiated (10 minutes) and pt was intubated.   Pt discussed during ICU rounds and with RN.  8/3 pt extubated  Spoke with pt and mom who was at bedside. Both report no recent weight changes and good appetite PTA.  Pt wants coffee to drink but currently NPO. On follow up pt's diet was advanced to Dysphagia III with thin liquids for lunch and consumed 75% of his meal.  Per RN pt has had residual confusion at times therefore unsure pt will consistently meet nutrition needs. Pt agreeable to trying ensure.   Medications and labs reviewed  Diet Order:   Diet Order           DIET DYS 3 Room service appropriate? Yes; Fluid consistency: Thin  Diet effective now          EDUCATION NEEDS:   No education needs have been identified at this time  Skin:  Skin Assessment: Reviewed RN Assessment  Last BM:  8/5  Height:   Ht Readings from Last 1 Encounters:  08/13/17 5\' 2"  (1.575 m)    Weight:   Wt Readings from Last 1 Encounters:  08/18/17 158 lb 11.7 oz (72 kg)    Ideal Body Weight:  53.6 kg  BMI:  Body mass index is 29.03 kg/m.  Estimated Nutritional Needs:   Kcal:  1700-1900  Protein:  85-100 grams  Fluid:  >/= 1.7 L  Kendell BaneHeather Liberato Stansbery RD, LDN,  CNSC [phone] Pager [phone] After Hours Pager

## 2017-08-18 NOTE — Progress Notes (Signed)
  Speech Language Pathology Treatment: Dysphagia  Patient Details Name: [name] Cruz MRN: [id number] DOB: December 27, 1955 Today's Date: 08/18/2017 Time: [id number] SLP Time Calculation (min) (ACUTE ONLY): 20 min  Assessment / Plan / Recommendation Clinical Impression  Pts mentation much improved, able to sit upright unrestrained and self feed with assist and adapted spoon. Pt demonstrates intermittent throat clearing following bites of puree or thin liquids, but swallow subjectively is timely and strong. No coughing with consecutive straw sips of water. Recommend pt initiate a mechanical soft diet with thin liquids with precautions. Will f/u for tolerance.   HPI HPI: 62 y/o M who [facility] via EMS with reports of seizures.  Per patients son, he was in line at [employer] and then had garbled speech, spun around and fell to the ground with seizure like activity.  EMS reported the patient was post-ictal on arrival to scene.  PMH of large hemorrhagic CVA in 2007 with residual speech difficulties.  The patient was alert/oriented to self & place on arrival to ER.  While in the room, he was witnessed to have altered speech and couldn't tell the staff where he was etc. He then had a seizure and was treated with 1mg  IV ativan.  He relaxed after about 30 seconds.   He was sent for CT head and while in the scanner he had an episode of ventricular tachycardia with arrest.  CPR was initiated and he was returned to the ER.  By the time he arrived to the ER, he was in a narrow complex rhythm without a pulse.  He was not shocked.  CPR total time ~ 10 minutes. Pt treated with amiodarone, EPI x2 and intubated without drugs. During intubation, he seized another time. Initial labs-Na 132, K 3.9, CL 99, CO2 22, glucose 206, BUN 10 / sr cr 0.85, Mg 1.8, WBC 7.9, Hgb 11.2 and platelets 231.  CXR pending.  CT of the head 7/27 showed no acute abnormality, moderate chronic small vessel disease, stable chronic  ventriculomegaly compatible with left temporal occipital encephalomalacia / central volume loss.   He was intubated from 08/09/2017-08/16/2017.  Most recent chest xray is showing no active airspace disease.  MRI of the head dated 08/14/2017 is whosing symmetric increased FLAIR and diffusion signoal abnormality involving the medial temporal lobes/hippocampi billaterally most likely sequelae of seizures, 6mm infarct right parietal lobe and sequelae of remote hemorrhagic infarct left parietal/temporal regions as well as mutiple chronic microhemorrhages.        SLP Plan  Continue with current plan of care       Recommendations  Diet recommendations: Dysphagia 3 (mechanical soft);Thin liquid Medication Administration: Whole meds with puree Supervision: Staff to assist with self feeding Compensations: Slow rate;Small sips/bites;Minimize environmental distractions Postural Changes and/or Swallow Maneuvers: Seated upright 90 degrees                Plan: Continue with current plan of care       GO               [facility] Surgery CenterBonnie Sailor Haughn, MA CCC-SLP [phone]  Claudine MoutonDeBlois, Skyley Grandmaison Caroline 08/18/2017, 2:12 PM

## 2017-08-19 DIAGNOSIS — I6930 Unspecified sequelae of cerebral infarction: Secondary | ICD-10-CM

## 2017-08-19 DIAGNOSIS — I639 Cerebral infarction, unspecified: Secondary | ICD-10-CM

## 2017-08-19 DIAGNOSIS — I4891 Unspecified atrial fibrillation: Secondary | ICD-10-CM

## 2017-08-19 DIAGNOSIS — I82622 Acute embolism and thrombosis of deep veins of left upper extremity: Secondary | ICD-10-CM

## 2017-08-19 DIAGNOSIS — I69391 Dysphagia following cerebral infarction: Secondary | ICD-10-CM

## 2017-08-19 DIAGNOSIS — F1010 Alcohol abuse, uncomplicated: Secondary | ICD-10-CM

## 2017-08-19 DIAGNOSIS — R0682 Tachypnea, not elsewhere classified: Secondary | ICD-10-CM

## 2017-08-19 DIAGNOSIS — R29818 Other symptoms and signs involving the nervous system: Secondary | ICD-10-CM

## 2017-08-19 DIAGNOSIS — I25810 Atherosclerosis of coronary artery bypass graft(s) without angina pectoris: Secondary | ICD-10-CM

## 2017-08-19 LAB — CBC WITH DIFFERENTIAL/PLATELET
ABS IMMATURE GRANULOCYTES: 0.2 10*3/uL — AB (ref 0.0–0.1)
BASOS ABS: 0 10*3/uL (ref 0.0–0.1)
Basophils Relative: 1 %
EOS PCT: 2 %
Eosinophils Absolute: 0.1 10*3/uL (ref 0.0–0.7)
HCT: 29.1 % — ABNORMAL LOW (ref 39.0–52.0)
HEMOGLOBIN: 9.5 g/dL — AB (ref 13.0–17.0)
IMMATURE GRANULOCYTES: 2 %
LYMPHS ABS: 1.7 10*3/uL (ref 0.7–4.0)
LYMPHS PCT: 24 %
MCH: 31.7 pg (ref 26.0–34.0)
MCHC: 32.6 g/dL (ref 30.0–36.0)
MCV: 97 fL (ref 78.0–100.0)
Monocytes Absolute: 0.9 10*3/uL (ref 0.1–1.0)
Monocytes Relative: 12 %
NEUTROS ABS: 4.5 10*3/uL (ref 1.7–7.7)
NEUTROS PCT: 61 %
Platelets: 314 10*3/uL (ref 150–400)
RBC: 3 MIL/uL — ABNORMAL LOW (ref 4.22–5.81)
RDW: 15 % (ref 11.5–15.5)
WBC: 7.4 10*3/uL (ref 4.0–10.5)

## 2017-08-19 LAB — GLUCOSE, CAPILLARY
GLUCOSE-CAPILLARY: 95 mg/dL (ref 70–99)
GLUCOSE-CAPILLARY: 91 mg/dL (ref 70–99)
Glucose-Capillary: 103 mg/dL — ABNORMAL HIGH (ref 70–99)
Glucose-Capillary: 58 mg/dL — ABNORMAL LOW (ref 70–99)
Glucose-Capillary: 75 mg/dL (ref 70–99)
Glucose-Capillary: 94 mg/dL (ref 70–99)

## 2017-08-19 MED ORDER — APIXABAN 5 MG PO TABS: 5.0000 mg | ORAL_TABLET | Freq: Two times a day (BID) | ORAL | Status: DC

## 2017-08-19 MED ORDER — APIXABAN 5 MG PO TABS
10.0000 mg | ORAL_TABLET | Freq: Two times a day (BID) | ORAL | Status: DC
  Administered 2017-08-19 – 2017-08-22 (×6): 10 mg via ORAL
  Filled 2017-08-19 (×7): qty 2

## 2017-08-19 MED ORDER — INSULIN ASPART 100 UNIT/ML ~~LOC~~ SOLN: 0.0000 [IU] | Freq: Three times a day (TID) | SUBCUTANEOUS | Status: DC

## 2017-08-19 MED ORDER — ASPIRIN EC 81 MG PO TBEC
81.0000 mg | DELAYED_RELEASE_TABLET | Freq: Every day | ORAL | Status: DC
  Administered 2017-08-19 – 2017-08-22 (×4): 81 mg via ORAL
  Filled 2017-08-19 (×4): qty 1

## 2017-08-19 MED ORDER — ATORVASTATIN CALCIUM 80 MG PO TABS
80.0000 mg | ORAL_TABLET | Freq: Every day | ORAL | Status: DC
  Administered 2017-08-19 – 2017-08-22 (×4): 80 mg via ORAL
  Filled 2017-08-19 (×3): qty 1

## 2017-08-19 NOTE — Consult Note (Signed)
Physical Medicine and Rehabilitation Consult   Reason for Consult: Stroke/Seizures with encephalopathy Referring Physician: Dr. Denese Killings   HPI: [name] Cruz is a 62 y.o. male with history of CAD s/p CABG 02/2017, A fib, ICH due to HTN with residual cognitive deficits, alcohol abuse; who was admitted on 08/09/17 with fall and witnessed seizure activity while at the grocery store. History taken from chart review. He had another seizure while in ED and was treated with ativan. He had recurrent seizure with VT arrest while in CT and was treated with ACLS protocol and was intubated for airway protection. He was loaded with Keppra. Seizure felt to be due to non-compliance as patient with history of seizures and last filled Dilantin in march.   CT head showed left temporal occipital encephalomalacia with central volume loss. 2D echo done revealing EF 45-50% with mild diffuse hypokinesis and moderate to severely calcified aortic annulus.   He was placed on LTM EEG due to ongoing tremors with status epilepticus and  temporal/frontal spikes s/o of cortical irritability/subclinical seizures. Dilantin and Vimpat added with resolution of seizures. EKG showed significant QT prolongation and Dr. Anza Lions felt that new EKG changes due to significant neurological event due to no other evidence of ACS .    MRI brain on 8/1 reviewed, showing right CVA. Per report, revealing acute right parietal cortical/subcortical infarct and diffusion signal abnormality in mesial temporal lobes and bilateral hippocampi likely refecting sequelae of seizure. Hospital course significant for aspiration PNA, LUE DVT, hypotension and agitation.  He was weaned off propofol and tolerated extubation by 8/3.  Neurology recommends slow wean off Keppra and ativan and patient/family educated on importance of alcohol cessation. Therapy evaluations completed yesterday and patient started on dysphagia 3, thins. CIR recommended due to balance  and cognitive deficits.     Review of Systems  Unable to perform ROS: Mental acuity     Past Medical History:  Diagnosis Date  . Cognitive impairment 2007   after stroke, saw rehab but told to stop because was too upsetting to him  . History of chicken pox   . HLD (hyperlipidemia)   . HTN (hypertension)   . NSTEMI (non-ST elevated myocardial infarction) (HCC) 02/21/2017  . Obesity   . Stroke, hemorrhagic (HCC) 2007   thought 2/2 HTN (240sbp); residual cognitive impairment, loss of R peripheral field, no driving    Past Surgical History:  Procedure Laterality Date  . ANKLE SURGERY  1990s   right foot with plate and screws  . CORONARY ARTERY BYPASS GRAFT N/A 02/24/2017   3v Procedure: CORONARY ARTERY BYPASS GRAFTING (CABG) x 3 ON PUMP USING LEFT INTERNAL MAMMARY ARTERY TO LEFT ANTERIOR DESENDING CORNARY ARTERY, RIGHT GREATER SAPHENOUS VEIN TO LEFT CIRCUMFLEX ARTERY AND POSTERIOR DESENDING ARTERY. RIGHT GREATER SAPHENOUS VEIN OBTAINED VIA ENDOVEIN HARVEST.;  Surgeon: Delight Ovens, MD  . IABP INSERTION N/A 02/21/2017   Procedure: IABP Insertion;  Surgeon: Yvonne Kendall, MD;  Location: [facility];  Service: Cardiovascular;  Laterality: N/A;  . LEFT HEART CATH AND CORONARY ANGIOGRAPHY N/A 02/21/2017   Procedure: LEFT HEART CATH AND CORONARY ANGIOGRAPHY;  Surgeon: Yvonne Kendall, MD;  Location: [facility];  Service: Cardiovascular;  Laterality: N/A;  . TEE WITHOUT CARDIOVERSION N/A 02/24/2017   Procedure: TRANSESOPHAGEAL ECHOCARDIOGRAM (TEE);  Surgeon: Delight Ovens, MD;  Location: [facility] OR;  Service: Open Heart Surgery;  Laterality: N/A;    Family History  Problem Relation Age of Onset  . Alzheimer's  disease Maternal Grandfather   . Cancer Mother        lymphoma  . Alcohol abuse Father        smoker  . Coronary artery disease Neg Hx   . Stroke Neg Hx   . Diabetes Neg Hx     Social History:  Lives with sister? Mother? He reports that he has never smoked. He  has quit using smokeless tobacco. He reports that he drinks about couple of beers daily? He reports that he does not use drugs.    Allergies: No Known Allergies    Medications Prior to Admission  Medication Sig Dispense Refill  . aspirin EC 325 MG EC tablet Take 1 tablet (325 mg total) by mouth daily.    Marland Kitchen atorvastatin (LIPITOR) 80 MG tablet TAKE 1 TABLET (80 MG TOTAL) BY MOUTH DAILY. FOR CHOLESTEROL (Patient taking differently: Take 80 mg by mouth daily. FOR CHOLESTEROL) 90 tablet 1  . carvedilol (COREG) 3.125 MG tablet Take 1 tablet (3.125 mg total) by mouth 2 (two) times daily with a meal. 1810 tablet 1  . ezetimibe (ZETIA) 10 MG tablet Take 1 tablet (10 mg total) by mouth daily. 90 tablet 3  . losartan (COZAAR) 25 MG tablet Take 1 tablet (25 mg total) by mouth daily. 90 tablet 1  . oxyCODONE (OXY IR/ROXICODONE) 5 MG immediate release tablet Take 1-2 tablets (5-10 mg total) by mouth every 6 (six) hours as needed for severe pain. (Patient not taking: Reported on 07/12/2017) 30 tablet 0  . phenytoin (DILANTIN) 100 MG ER capsule Take 100 mg by mouth 3 (three) times daily.      Home: Home Living Family/patient expects to be discharged to:: Private residence Living Arrangements: Other relatives(oldest sister) Available Help at Discharge: Family, Friend(s), Available 24 hours/day Type of Home: Mobile home Home Access: Stairs to enter Secretary/administrator of Steps: 3 Entrance Stairs-Rails: Can reach both Home Layout: One level Bathroom Shower/Tub: Associate Professor: Yes Home Equipment: None  Functional History: Prior Function Level of Independence: Independent Comments: doesn't drive Functional Status:  Mobility: Bed Mobility Overal bed mobility: Needs Assistance Bed Mobility: Rolling, Sidelying to Sit Rolling: Min assist Sidelying to sit: Min assist General bed mobility comments: max directional verbal cues and tactile cues to  complete task Transfers Overall transfer level: Needs assistance Equipment used: None Transfers: Sit to/from Stand Sit to Stand: Min assist, +2 physical assistance, +2 safety/equipment General transfer comment: pt slightly impulsive for standing but requiried minA to power up. Pt with wide base of support and unsteady upon standing Ambulation/Gait Ambulation/Gait assistance: Mod assist, +2 safety/equipment Gait Distance (Feet): 130 Feet Assistive device: 1 person hand held assist Gait Pattern/deviations: Step-through pattern, Decreased stride length, Wide base of support, Staggering left, Staggering right General Gait Details: modA to maintain balance and amb in straight line, tactile cues to posterior R hip for directional navigation as pt with difficulty following commands Gait velocity: decreased Gait velocity interpretation: <1.31 ft/sec, indicative of household ambulator    ADL:    Cognition: Cognition Overall Cognitive Status: Impaired/Different from baseline Orientation Level: Oriented to person, Oriented to place, Disoriented to time, Disoriented to situation Cognition Arousal/Alertness: Awake/alert Behavior During Therapy: Flat affect Overall Cognitive Status: Impaired/Different from baseline Area of Impairment: Orientation, Attention, Memory, Following commands, Safety/judgement, Awareness, Problem solving Orientation Level: Disoriented to, Time(with verbal cues pt able to state [place], [place], [place]) Current Attention Level: Focused Memory: Decreased short-term memory Following Commands: Follows one step commands with  increased time, Follows one step commands inconsistently, Follows multi-step commands inconsistently Safety/Judgement: Decreased awareness of safety, Decreased awareness of deficits Awareness: Intellectual(no idea he had a BM) Problem Solving: Slow processing, Decreased initiation, Difficulty sequencing, Requires verbal cues, Requires tactile cues General  Comments: pt very slow to respond, perseverates, impaired comprehension limiting MMT ability   Blood pressure (!) 133/98, pulse 91, temperature 99 F (37.2 C), temperature source Oral, resp. rate (!) 21, height 5\' 2"  (1.575 m), weight 73.3 kg (161 lb 9.6 oz), SpO2 100 %. Physical Exam  Nursing note and vitals reviewed. Constitutional: He appears well-developed and well-nourished.  HENT:  Head: Normocephalic and atraumatic.  Eyes: EOM are normal. Right eye exhibits no discharge. Left eye exhibits no discharge.  Neck: Normal range of motion. Neck supple.  Cardiovascular: Normal rate and regular rhythm.  Respiratory: Effort normal and breath sounds normal.  Wet cough  GI: Soft. Bowel sounds are normal.  Musculoskeletal:  No edema or tenderness in extremities  Neurological: He is alert.  Able to answer orientation questions with choice of two. Able to state month as "August" and looking forward to "[place]".   Receptive > expressive deficits.  Tangential and had difficulty pointing or following simple motor commands without tactile cues.  Motor (limited by inconsistent command following): Right upper extremity/right lower extremity: 5/5 proximal distal Left upper shortness/left lower trauma: 4 minus/5 Sensation intact light touch  Skin:  Multiple scabbed areas left forearm and ecchymotic area right forearm.   Psychiatric: His speech is slurred. He is slowed.    Results for orders placed or performed during the hospital encounter of 08/09/17 (from the past 24 hour(s))  Glucose, capillary     Status: None   Collection Time: 08/18/17 11:56 AM  Result Value Ref Range   Glucose-Capillary 70 70 - 99 mg/dL   Comment 1 Notify RN    Comment 2 Document in Chart   Glucose, capillary     Status: Abnormal   Collection Time: 08/18/17  3:35 PM  Result Value Ref Range   Glucose-Capillary 62 (L) 70 - 99 mg/dL   Comment 1 Notify RN    Comment 2 Document in Chart   Glucose, capillary      Status: None   Collection Time: 08/18/17  4:03 PM  Result Value Ref Range   Glucose-Capillary 70 70 - 99 mg/dL  Glucose, capillary     Status: None   Collection Time: 08/18/17  7:56 PM  Result Value Ref Range   Glucose-Capillary 96 70 - 99 mg/dL  Glucose, capillary     Status: None   Collection Time: 08/18/17 11:34 PM  Result Value Ref Range   Glucose-Capillary 95 70 - 99 mg/dL  Glucose, capillary     Status: None   Collection Time: 08/19/17  3:27 AM  Result Value Ref Range   Glucose-Capillary 94 70 - 99 mg/dL  CBC with Differential/Platelet     Status: Abnormal   Collection Time: 08/19/17  5:05 AM  Result Value Ref Range   WBC 7.4 4.0 - 10.5 K/uL   RBC 3.00 (L) 4.22 - 5.81 MIL/uL   Hemoglobin 9.5 (L) 13.0 - 17.0 g/dL   HCT 40.9 (L) 81.1 - 91.4 %   MCV 97.0 78.0 - 100.0 fL   MCH 31.7 26.0 - 34.0 pg   MCHC 32.6 30.0 - 36.0 g/dL   RDW 78.2 95.6 - 21.3 %   Platelets 314 150 - 400 K/uL   Neutrophils Relative % 61 %  Neutro Abs 4.5 1.7 - 7.7 K/uL   Lymphocytes Relative 24 %   Lymphs Abs 1.7 0.7 - 4.0 K/uL   Monocytes Relative 12 %   Monocytes Absolute 0.9 0.1 - 1.0 K/uL   Eosinophils Relative 2 %   Eosinophils Absolute 0.1 0.0 - 0.7 K/uL   Basophils Relative 1 %   Basophils Absolute 0.0 0.0 - 0.1 K/uL   Immature Granulocytes 2 %   Abs Immature Granulocytes 0.2 (H) 0.0 - 0.1 K/uL  Glucose, capillary     Status: None   Collection Time: 08/19/17  8:06 AM  Result Value Ref Range   Glucose-Capillary 95 70 - 99 mg/dL   Comment 1 Notify RN    Comment 2 Document in Chart    [place] Ekg Site Rite  Result Date: 08/17/2017 If Site Rite image not attached, placement could not be confirmed due to current cardiac rhythm.   Assessment/Plan: Diagnosis: acute right parietal infarct with seizures Labs and images (see above) independently reviewed.  Records reviewed and summated above.  1. Does the need for close, 24 hr/day medical supervision in concert with the patient's rehab needs make  it unreasonable for this patient to be served in a less intensive setting? Yes  2. Co-Morbidities requiring supervision/potential complications: seizure (recs per Neurology), CAD s/p CABG 02/2017 (cont meds), A fib (monitor HR with increased physical activity, cont meds), ICH due to HTN with residual cognitive deficits, alcohol abuse (counsel when appropriate), PNA, LUE DVT (cont meds), hypotension and agitation, dysphagia (advance diet as tolerated), tachypnea (monitor RR and O2 Sats with increased physical exertion) 3. Due to bladder management, bowel management, safety, skin/wound care, disease management, medication administration, pain management and patient education, does the patient require 24 hr/day rehab nursing? Yes 4. Does the patient require coordinated care of a physician, rehab nurse, PT (1-2 hrs/day, 5 days/week), OT (1-2 hrs/day, 5 days/week) and SLP (1-2 hrs/day, 5 days/week) to address physical and functional deficits in the context of the above medical diagnosis(es)? Yes Addressing deficits in the following areas: balance, endurance, locomotion, strength, transferring, bowel/bladder control, bathing, dressing, toileting, cognition, speech, language, swallowing and psychosocial support 5. Can the patient actively participate in an intensive therapy program of at least 3 hrs of therapy per day at least 5 days per week? Yes 6. The potential for patient to make measurable gains while on inpatient rehab is excellent 7. Anticipated functional outcomes upon discharge from inpatient rehab are supervision and min assist  with PT, supervision and min assist with OT, supervision and min assist with SLP. 8. Estimated rehab length of stay to reach the above functional goals is: 15-18 days. 9. Anticipated D/C setting: Home 10. Anticipated post D/C treatments: HH therapy and Home excercise program 11. Overall Rehab/Functional Prognosis: good  RECOMMENDATIONS: This patient's condition is appropriate  for continued rehabilitative care in the following setting: CIR when medically stable Patient has agreed to participate in recommended program. Potentially Note that insurance prior authorization may be required for reimbursement for recommended care.  Comment: Rehab Admissions Coordinator to follow up.   I have personally performed a face to face diagnostic evaluation, including, but not limited to relevant history and physical exam findings, of this patient and developed relevant assessment and plan.  Additionally, I have reviewed and concur with the physician assistant's documentation above.   Maryla MorrowAnkit Patel, MD, ABPMR Jacquelynn CreePamela S Love, PA-C 08/19/2017

## 2017-08-19 NOTE — Evaluation (Signed)
Occupational Therapy Evaluation Patient Details Name: [name] Cruz MRN: [id number] DOB: 1956-01-07 Today's Date: 08/19/2017    History of Present Illness 62 y/o M who presented to [facility] on 7/27 via EMS with reports of seizures.  Per patients son, he was in line at [employer] and then had garbled speech, spun around and fell to the ground with seizure like activity.  EMS reported the patient was post-ictal on arrival to scene.  PMH of large hemorrhagic CVA in 2007 with residual speech difficulties.  The patient was alert/oriented to self & place on arrival to ER.  While in the room, he was witnessed to have altered speech and couldn't tell the staff where he was etc. He then had a seizure and was treated with 1mg  IV ativan.  He relaxed after about 30 seconds.   He was sent for CT head and while in the scanner he had an episode of ventricular tachycardia with arrest.  CPR was initiated and he was returned to the ER.  By the time he arrived to the ER, he was in a narrow complex rhythm without a pulse.  He was not shocked.  CPR total time ~ 10 minutes. Pt treated with amiodarone, EPI x2 and intubated without drugs. During intubation, he seized another time. Initial labs-Na 132, K 3.9, CL 99, CO2 22, glucose 206, BUN 10 / sr cr 0.85, Mg 1.8, WBC 7.9, Hgb 11.2 and platelets 231.  CXR pending.  CT of the head 7/27 showed no acute abnormality, moderate chronic small vessel disease, stable chronic ventriculomegaly compatible with left temporal occipital encephalomalacia / central volume loss.   He was intubated from 08/09/2017-08/16/2017.  Most recent chest xray is showing no active airspace disease.  MRI of the head dated 08/14/2017 is whosing symmetric increased FLAIR and diffusion signoal abnormality involving the medial temporal lobes/hippocampi billaterally most likely sequelae of seizures, 6mm infarct right parietal lobe and sequelae of remote hemorrhagic infarct left parietal/temporal regions as well as mutiple  chronic microhemorrhages.  PMH of large hemorrhagic CVA in 2007 with residual speech difficulties. CABG 2019   Clinical Impression   PT admitted with new CVA with cardiac arrest intubated 8 days this admission. Pt currently with functional limitiations due to the deficits listed below (see OT problem list). Pt currently with cognitive balance and activity tolerance deficits.  Pt will benefit from skilled OT to increase their independence and safety with adls and balance to allow discharge CIR. .    Follow Up Recommendations  CIR    Equipment Recommendations  Other (comment)    Recommendations for Other Services Rehab consult     Precautions / Restrictions Precautions Precautions: Fall Precaution Comments: confusion, impaired cognition Restrictions Weight Bearing Restrictions: No      Mobility Bed Mobility Overal bed mobility: Needs Assistance Bed Mobility: Rolling;Sidelying to Sit Rolling: Min guard Sidelying to sit: Min guard       General bed mobility comments: cues to remain seated   Transfers Overall transfer level: Needs assistance Equipment used: None Transfers: Sit to/from Stand Sit to Stand: +2 safety/equipment;Min assist;+2 physical assistance         General transfer comment: pt impulsive and lacks insight to fall risk    Balance Overall balance assessment: Needs assistance Sitting-balance support: No upper extremity supported;Feet supported Sitting balance-Leahy Scale: Fair     Standing balance support: Single extremity supported Standing balance-Leahy Scale: Poor  ADL either performed or assessed with clinical judgement   ADL Overall ADL's : Needs assistance/impaired Eating/Feeding: Set up Eating/Feeding Details (indicate cue type and reason): sitting Grooming: Sitting;Set up               Lower Body Dressing: Moderate assistance;Sit to/from stand Lower Body Dressing Details (indicate cue type and  reason): pt is able to sit EOB and pull up socks. pt does require (A) for steady balacne with basic transfer. pt with LOB lateral with poor self correction Toilet Transfer: Moderate assistance           Functional mobility during ADLs: Moderate assistance General ADL Comments: pt with lob in all directions. pt standing with bil calves against bed surface and posterior lob when cued to take a step foward from bed. pt with lateral LOB with head turns and even just basic transfer. pt denies deficits and staes "i understand" when educated on fall risk     Vision Patient Visual Report: Other (comment)(hx of r perpherial deficits) Additional Comments: pt noted to have some decr speed to L eye movement compared to R eye movement     Perception     Praxis      Pertinent Vitals/Pain Pain Assessment: No/denies pain     Hand Dominance Right   Extremity/Trunk Assessment Upper Extremity Assessment Upper Extremity Assessment: LUE deficits/detail LUE Deficits / Details: 4 out 5 MMT shoulder flexion       Cervical / Trunk Assessment Cervical / Trunk Assessment: Normal   Communication Communication Communication: Expressive difficulties   Cognition Arousal/Alertness: Awake/alert Behavior During Therapy: Flat affect Overall Cognitive Status: Impaired/Different from baseline Area of Impairment: Orientation;Attention;Memory;Following commands;Safety/judgement;Awareness;Problem solving                 Orientation Level: Disoriented to;Time Current Attention Level: Focused Memory: Decreased short-term memory Following Commands: Follows one step commands with increased time;Follows one step commands inconsistently;Follows multi-step commands inconsistently Safety/Judgement: Decreased awareness of safety;Decreased awareness of deficits Awareness: Intellectual Problem Solving: Slow processing;Decreased initiation;Difficulty sequencing;Requires verbal cues;Requires tactile cues General  Comments: pt perseverating on mowing yards, "i understand" "i know" are two phrases patient says over and over regardless of discussion. pt lacks awareness to deficits and denies any cardiac history. pt states "nothing wrong with my heart but i have had 3 strokes" pt later in session states "when i had my heart attack"   General Comments       Exercises     Shoulder Instructions      Home Living Family/patient expects to be discharged to:: Private residence Living Arrangements: Other relatives(oldest sister) Available Help at Discharge: Family;Friend(s);Available 24 hours/day Type of Home: Mobile home Home Access: Stairs to enter Entrance Stairs-Number of Steps: 3 Entrance Stairs-Rails: Can reach both Home Layout: One level     Bathroom Shower/Tub: Chief Strategy OfficerTub/shower unit   Bathroom Toilet: Standard Bathroom Accessibility: Yes   Home Equipment: None   Additional Comments: all home hx from PT evaluation. Pt reports living with one sister but has 2 sisters. 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      Prior Functioning/Environment Level of Independence: Independent        Comments: doesn't drive        OT Problem List: Decreased strength;Decreased activity tolerance;Impaired balance (sitting and/or standing);Impaired vision/perception;Decreased coordination;Decreased cognition;Decreased safety awareness;Decreased knowledge of use of DME or AE;Decreased knowledge of precautions      OT Treatment/Interventions: Self-care/ADL training;Therapeutic exercise;Energy conservation;DME and/or AE instruction;Therapeutic activities;Cognitive remediation/compensation;Visual/perceptual remediation/compensation;Patient/family education;Balance  training    OT Goals(Current goals can be found in the care plan section) Acute Rehab OT Goals Patient Stated Goal: didn't state OT Goal Formulation: Patient unable to participate in goal setting Time For Goal Achievement: 09/02/17 Potential  to Achieve Goals: Good  OT Frequency: Min 2X/week   Barriers to D/C:            Co-evaluation PT/OT/SLP Co-Evaluation/Treatment: Yes Reason for Co-Treatment: For patient/therapist safety   OT goals addressed during session: ADL's and self-care;Proper use of Adaptive equipment and DME;Strengthening/ROM      AM-PAC PT "6 Clicks" Daily Activity     Outcome Measure Help from another person eating meals?: A Little Help from another person taking care of personal grooming?: A Little Help from another person toileting, which includes using toliet, bedpan, or urinal?: A Little Help from another person bathing (including washing, rinsing, drying)?: A Little Help from another person to put on and taking off regular upper body clothing?: A Little Help from another person to put on and taking off regular lower body clothing?: A Little 6 Click Score: 18   End of Session Equipment Utilized During Treatment: Gait belt Nurse Communication: Mobility status;Precautions  Activity Tolerance: Patient tolerated treatment well Patient left: in chair;with call bell/phone within reach;with chair alarm set  OT Visit Diagnosis: Unsteadiness on feet (R26.81)                Time: 1610-9604 OT Time Calculation (min): 23 min Charges:  OT General Charges $OT Visit: 1 Visit OT Evaluation $OT Eval Moderate Complexity: 1 Mod   Mateo Flow   OTR/L Pager: [phone] Office: [phone] .   Boone Master B 08/19/2017, 2:40 PM

## 2017-08-19 NOTE — Progress Notes (Signed)
Pulmonary/critical care medicine  Reason for admission: Seizure and cardiac arrest  Subjective/overnight events:  Extubated over the weekend.  Patient was calm and cooperative overnight.  His appetite and oral intake have improved.  He ambulated with physical therapy without difficulty yesterday.  He denies any specific complaints and knows that he is in hospital.  Initially  Synopsis of present illness and course in hospital:  This 62 year old man was admitted on 7/27 with reports of seizures. Per patients son, he was in line at [employer] and then had garbled speech, spun around and fell to the ground with seizure like activity. EMS reported the patient was post-ictal on arrival to scene.  He was initially alert and oriented on arrival to the ED but then had a witnessed generalized tonic-clonic seizure lasting 30 seconds treated with Ativan.  He had a ventricular tachycardia cardiac arrest with CPR in the CT scanner treated with amiodarone and epi.  No cardioversion.  Return of circulation after 10 minutes.  Cardiology consulted.  Prolonged QT noted.  Arrest felt to be secondary to seizure.  Started on IV sedation and anticonvulsants to achieve burst suppression.  Sedation discontinued 7/30.  Patient's mental status allowed extubation 8/3.  Agitated requiring dexmedetomidine.  Transition to tapering dose of lorazepam.  Past Medical History:  Diagnosis Date  . Cognitive impairment 2007   after stroke, saw rehab but told to stop because was too upsetting to him  . History of chicken pox   . HLD (hyperlipidemia)   . HTN (hypertension)   . NSTEMI (non-ST elevated myocardial infarction) (HCC) 02/21/2017  . Obesity   . Stroke, hemorrhagic (HCC) 2007   thought 2/2 HTN (240sbp); residual cognitive impairment, loss of R peripheral field, no driving Developed seizures thereafter.  Phenytoin recently stopped.   Objective assessment:  Vitals:   08/18/17 1500 08/18/17 1600  BP: 136/90 131/81   Pulse:    Resp: 18 19  Temp:    SpO2: 95%  on room air    Physical Exam  Constitutional: He is oriented to person, place, and time.  Appears in poor health.  Ill kempt  HENT:  Head: Normocephalic and atraumatic.  Nose: Nose normal.  Mouth/Throat: No oropharyngeal exudate.  Eyes: Conjunctivae are normal. No scleral icterus.  Neck: Neck supple. No JVD present. No tracheal deviation present.  Cardiovascular: Normal rate, normal heart sounds and intact distal pulses.  Pulmonary/Chest: Breath sounds normal. No stridor. No respiratory distress. He has no rales.  Abdominal: Soft. Bowel sounds are normal.  Genitourinary:  Genitourinary Comments: Foley catheter in place  Musculoskeletal: He exhibits no edema.  Neurological: He is alert and oriented to person, place, and time. He has normal strength. No cranial nerve deficit. GCS eye subscore is 4. GCS verbal subscore is 5. GCS motor subscore is 6.  Speech is soft but fluent.  Skin: Skin is warm and dry. Capillary refill takes less than 2 seconds.  Left forearm shows area of induration consistent with extravasated IV.    Problem list:   Status epilepticus due to anticonvulsant withdrawal in context of prior intracerebral hemorrhage.   No further seizure activity appears well-controlled on Keppra.    Switch to oral anticonvulsants.  Monitor for seizure recurrence. Discontinue phenytoin as level was subtherapeutic and likely therefore not necessary.   Acute confusional state on background of cognitive impairment.  Disorientation and inattention consistent with delirium.  Has improved considerably may be or near cognitive baseline.  Limit sedative medications.  Frequent reorientation.  Early  ambulation.  Thiamine supplementation.  Behavior is well controlled with low-dose lorazepam-initiate taper.   Status post VT cardiac arrest.  Echocardiogram on this admission 7/28:  Study Conclusions  - Left ventricle: The cavity size was  normal. There was mild focal   basal hypertrophy of the septum. Systolic function was mildly   reduced. The estimated ejection fraction was in the range of 45%   to 50%. Mild diffuse hypokinesis. The study is not technically   sufficient to allow evaluation of LV diastolic function. - Aortic valve: Moderately to severely calcified annulus.   Trileaflet; normal thickness, moderately calcified leaflets.   There was mild regurgitation. - Pulmonary arteries: PA peak pressure: 32 mm Hg (S). - Inferior vena cava: The vessel was mildly dilated.  EKG shows no QT prolongation (reviewed personally)  Arrest in the context of seizures.  No secondary prevention deemed necessary.  No recurrent arrhythmias since.  Will manage expectantly.   Acute left upper extremity DVT.  Venous Doppler performed 8/1 shows acute DVT of basilic vein.  Likely related to venous catheterization.  CBC Latest Ref Rng & Units 08/19/2017 08/18/2017 08/17/2017  WBC 4.0 - 10.5 K/uL 7.4 7.3 6.6  Hemoglobin 13.0 - 17.0 g/dL 1.6(X) 10.0(L) 9.0(L)  Hematocrit 39.0 - 52.0 % 29.1(L) 31.6(L) 29.1(L)  Platelets 150 - 400 K/uL 314 305 253   Continue anticoagulation for 3 months for provoked DVT - can sw be will switch to Eliquis at this time.   Hypoglycemia related to poor nutritional status.  CBG (last 3)  Recent Labs    08/18/17 2334 08/19/17 0327 08/19/17 0806  GLUCAP 95 94 95   BMET    Component Value Date/Time   NA 142 08/18/2017 0311   K 3.5 08/18/2017 0311   CL 106 08/18/2017 0311   CO2 27 08/18/2017 0311   GLUCOSE 77 08/18/2017 0311   BUN 16 08/18/2017 0311   CREATININE 0.57 (L) 08/18/2017 0311   CALCIUM 8.6 (L) 08/18/2017 0311   GFRNONAA >60 08/18/2017 0311   GFRAA >60 08/18/2017 0311    Improved intake. Eating without difficulty.  Discontinue iv fluids.  Quality measures:  VAP bundle: Not applicable. Sedation reduction: Not applicable. Secondary cardiovascular prevention: Will restart aspirin and  initiate statin. DVT prophylaxis: transition to apixaban. Stress ulcer prophylaxis: Not applicable. Enteral nutrition: Has progressed to dysphagia 3 diet.  SLP following. Likely can progress diet. Early mobility: Up to chair today.  Progressive ambulation. PT  Lines and drains: Urine output adequate. Foley catheter removed. PICC line in place as poor iv access. Antibiotic de-escalation: On no antimicrobials. Sleep hygiene: Decrease neuro vital frequency.  Disposition: Transfer to MedSurg.  No telemetry as he has had no further arrhythmias.  Full code.  Needs discharge planning.  Lynnell Catalan, MD [facility] ICU Physician [facility]  Pager: [phone] Mobile: [phone] After hours: [phone].

## 2017-08-19 NOTE — Progress Notes (Signed)
ANTICOAGULATION CONSULT NOTE   Pharmacy Consult for Apixaban Indication: DVT  Patient Measurements: Height: 5\' 2"  (157.5 cm) Weight: 161 lb 9.6 oz (73.3 kg) IBW/kg (Calculated) : 54.6 HEPARIN DW (KG): 70.9   Vital Signs: Temp: 99 F (37.2 C) (08/06 0800) Temp Source: Oral (08/06 0800) BP: 133/98 (08/06 0800) Pulse Rate: 91 (08/06 0800)  Medications:  Scheduled:  . aspirin EC  81 mg Oral Daily  . atorvastatin  80 mg Per Tube Daily  . atorvastatin  80 mg Oral Daily  . feeding supplement (ENSURE ENLIVE)  237 mL Oral Q24H  . levETIRAcetam  1,000 mg Oral BID  . LORazepam  0.5 mg Oral BID  . multivitamin with minerals  1 tablet Oral Daily  . sodium chloride flush  10-40 mL Intracatheter Q12H  . thiamine  100 mg Oral Daily   Infusions:    Assessment: Pt found to have new DVT in left upper extremity. Currently on subQ Lovenox 1 mg/kg q 12 hours. Pharmacy consulted to switch to apixaban. Last dose of therapeutic Lovenox was this AM. H/H low stable. Plt wnl. SCr 0.57  Of note, patient has a history of heavy alcohol use but he seems to have stopped drinking a few months ago.    Goal of Therapy:  DVT treatment Monitor platelets by anticoagulation protocol: Yes   Plan:  -D/c Lovenox -Start apixaban 10 mg twice daily x 7 days. First dose this evening at 1700. After 7 days, switch to apixaban 5 mg twice daily -Monitor CBC, renal fx and s/s of bleeding -Will educate patient on apixaban and reinforce importance of abstinence from alcohol given history of heavy alcohol use.    Vinnie LevelBenjamin Kafi Dotter, PharmD., BCPS Clinical Pharmacist Clinical phone for 08/19/17 until 3:30pm: [phone] If after 3:30pm, [facility] to [facility] for unit-specific pharmacist

## 2017-08-19 NOTE — Progress Notes (Signed)
   08/19/17 1200  SLP Visit Information  SLP Received On 08/19/17  General Information  Temperature Spikes Noted No  Respiratory Status Room air  Oral Cavity - Dentition Adequate natural dentition  Patient observed directly with PO's Yes  Type of PO's observed Thin liquids;Regular  Feeding Able to feed self  Liquids provided via Straw  Treatment Provided  Treatment provided Dysphagia; Pt tolerating solids and thin liquids. No further signs of aspiration seen. Pt able to self feed. No SLP f/u needed for swallowing.   Dysphagia Treatment  Treatment Methods Skilled observation;Upgraded PO texture trial;Differential diagnosis;Patient/caregiver education;Compensation strategy training  Amount of cueing Independent  Assessment / Recommendations / Plan  Plan All goals met  Dysphagia Recommendations  Diet recommendations Regular;Thin liquid  Progression Toward Goals  Progression toward goals Goals met, education completed, patient discharged from SLP  SLP Time Calculation  SLP Start Time (ACUTE ONLY) 1220  SLP Stop Time (ACUTE ONLY) 1228  SLP Time Calculation (min) (ACUTE ONLY) 8 min  SLP Evaluations  $ SLP Speech Visit 1 Visit  SLP Evaluations  $Swallowing Treatment 1 Procedure

## 2017-08-19 NOTE — Progress Notes (Signed)
Inpatient Rehabilitation-Admissions Coordinator   Met with pt as follow up from PM&R consult. AC presented pt with information regarding CIR, length of stay, and estimated functional level at DC. Due to deficits in cognition and communication, as asked pt for permission to discuss with his sisters, which pt consented to. AC is awaiting call back from family to discuss CIR recommendation further. Once consent obtained and interest confirmed, AC will initiate insurance authorization and follow for medical readiness.   Please call if questions.   Jhonnie Garner, OTR/L  Rehab Admissions Coordinator  [phone] 08/19/2017 5:45 PM

## 2017-08-19 NOTE — Progress Notes (Signed)
Physical Therapy Treatment Patient Details Name: [name] Cruz MRN: [id number] DOB: 1956/01/08 Today's Date: 08/19/2017    History of Present Illness 62 y/o M who [facility] via EMS with reports of seizures.  Per patients son, he was in line at [employer] and then had garbled speech, spun around and fell to the ground with seizure like activity.  EMS reported the patient was post-ictal on arrival to scene.  PMH of large hemorrhagic CVA in 2007 with residual speech difficulties.  The patient was alert/oriented to self & place on arrival to ER.  While in the room, he was witnessed to have altered speech and couldn't tell the staff where he was etc. He then had a seizure and was treated with 1mg  IV ativan.  He relaxed after about 30 seconds.   He was sent for CT head and while in the scanner he had an episode of ventricular tachycardia with arrest.  CPR was initiated and he was returned to the ER.  By the time he arrived to the ER, he was in a narrow complex rhythm without a pulse.  He was not shocked.  CPR total time ~ 10 minutes. Pt treated with amiodarone, EPI x2 and intubated without drugs. During intubation, he seized another time. Initial labs-Na 132, K 3.9, CL 99, CO2 22, glucose 206, BUN 10 / sr cr 0.85, Mg 1.8, WBC 7.9, Hgb 11.2 and platelets 231.  CXR pending.  CT of the head 7/27 showed no acute abnormality, moderate chronic small vessel disease, stable chronic ventriculomegaly compatible with left temporal occipital encephalomalacia / central volume loss.   He was intubated from 08/09/2017-08/16/2017.  Most recent chest xray is showing no active airspace disease.  MRI of the head dated 08/14/2017 is whosing symmetric increased FLAIR and diffusion signoal abnormality involving the medial temporal lobes/hippocampi billaterally most likely sequelae of seizures, 6mm infarct right parietal lobe and sequelae of remote hemorrhagic infarct left parietal/temporal regions as well as mutiple chronic  microhemorrhages.      PT Comments    Still confused, perseverative.  Gait unsteady with drifting, scissoring/staggering.   Follow Up Recommendations  CIR     Equipment Recommendations       Recommendations for Other Services Rehab consult     Precautions / Restrictions Precautions Precautions: Fall Precaution Comments: confusion, impaired cognition Restrictions Weight Bearing Restrictions: No    Mobility  Bed Mobility Overal bed mobility: Needs Assistance Bed Mobility: Rolling;Sidelying to Sit Rolling: Min guard Sidelying to sit: Min guard       General bed mobility comments: cues to remain seated   Transfers Overall transfer level: Needs assistance Equipment used: None Transfers: Sit to/from Stand Sit to Stand: +2 safety/equipment;Min assist;+2 physical assistance         General transfer comment: pt impulsive and lacks insight to fall risk  Ambulation/Gait Ambulation/Gait assistance: Min assist;+2 safety/equipment;+2 physical assistance Gait Distance (Feet): 250 Feet   Gait Pattern/deviations: Step-through pattern;Decreased stride length Gait velocity: generally slower with ability to speed up a little. Gait velocity interpretation: <1.8 ft/sec, indicate of risk for recurrent falls General Gait Details: pt unsteady overall, wandering both left and right with scissoring and staggering, but not really concerned that he was ou of balance.   Stairs             Wheelchair Mobility    Modified Rankin (Stroke Patients Only) Modified Rankin (Stroke Patients Only) Pre-Morbid Rankin Score: Slight disability Modified Rankin: Moderately severe disability  Balance Overall balance assessment: Needs assistance Sitting-balance support: No upper extremity supported;Feet supported Sitting balance-Leahy Scale: Fair     Standing balance support: Single extremity supported Standing balance-Leahy Scale: Poor Standing balance comment: requiring stability  assist whether static or dynamic.                            Cognition Arousal/Alertness: Awake/alert Behavior During Therapy: Flat affect Overall Cognitive Status: Impaired/Different from baseline Area of Impairment: Orientation;Attention;Memory;Following commands;Safety/judgement;Awareness;Problem solving                 Orientation Level: Disoriented to;Time Current Attention Level: Focused Memory: Decreased short-term memory Following Commands: Follows one step commands with increased time;Follows one step commands inconsistently;Follows multi-step commands inconsistently Safety/Judgement: Decreased awareness of safety;Decreased awareness of deficits Awareness: Intellectual Problem Solving: Slow processing;Decreased initiation;Difficulty sequencing;Requires verbal cues;Requires tactile cues General Comments: pt perseverating on mowing yards, "i understand" "i know" are two phrases patient says over and over regardless of discussion. pt lacks awareness to deficits and denies any cardiac history. pt states "nothing wrong with my heart but i have had 3 strokes" pt later in session states "when i had my heart attack"      Exercises      General Comments        Pertinent Vitals/Pain Pain Assessment: No/denies pain    Home Living Family/patient expects to be discharged to:: Private residence Living Arrangements: Other relatives(oldest sister) Available Help at Discharge: Family;Friend(s);Available 24 hours/day Type of Home: Mobile home Home Access: Stairs to enter Entrance Stairs-Rails: Can reach both Home Layout: One level Home Equipment: None Additional Comments: all home hx from PT evaluation. Pt reports living with one sister but has 2 sisters. 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    Prior Function Level of Independence: Independent      Comments: doesn't drive   PT Goals (current goals can now be found in the care plan section) Acute  Rehab PT Goals Patient Stated Goal: didn't state PT Goal Formulation: With patient Time For Goal Achievement: 09/01/17 Potential to Achieve Goals: Fair Progress towards PT goals: Progressing toward goals    Frequency    Min 4X/week      PT Plan Current plan remains appropriate    Co-evaluation PT/OT/SLP Co-Evaluation/Treatment: Yes Reason for Co-Treatment: For patient/therapist safety PT goals addressed during session: Mobility/safety with mobility OT goals addressed during session: ADL's and self-care;Proper use of Adaptive equipment and DME;Strengthening/ROM      AM-PAC PT "6 Clicks" Daily Activity  Outcome Measure  Difficulty turning over in bed (including adjusting bedclothes, sheets and blankets)?: A Little Difficulty moving from lying on back to sitting on the side of the bed? : A Little Difficulty sitting down on and standing up from a chair with arms (e.g., wheelchair, bedside commode, etc,.)?: Unable Help needed moving to and from a bed to chair (including a wheelchair)?: A Little Help needed walking in hospital room?: A Little Help needed climbing 3-5 steps with a railing? : A Lot 6 Click Score: 15    End of Session   Activity Tolerance: Patient tolerated treatment well Patient left: in chair;with call bell/phone within reach;with chair alarm set Nurse Communication: Mobility status PT Visit Diagnosis: Unsteadiness on feet (R26.81)     Time: [id number] PT Time Calculation (min) (ACUTE ONLY): 23 min  Charges:  $Gait Training: 8-22 mins  08/19/2017  Leonville Bing, PT [phone]  (pager)   [name] Cruz 08/19/2017, 4:24 PM

## 2017-08-19 NOTE — Discharge Instructions (Signed)
Information on my medicine - ELIQUIS (apixaban)  This medication education was reviewed with me or my healthcare representative as part of my discharge preparation.  The pharmacist that spoke with me during my hospital stay was:  Sampson SiMancheril, Chanise Habeck G, [facility]  Why was Eliquis prescribed for you? Eliquis was prescribed to treat blood clots that may have been found in the veins of your legs (deep vein thrombosis) or in your lungs (pulmonary embolism) and to reduce the risk of them occurring again.  What do You need to know about Eliquis ? The starting dose is 10 mg (two 5 mg tablets) taken TWICE daily for the FIRST SEVEN (7) DAYS, then on (enter date)  08/26/17  the dose is reduced to ONE 5 mg tablet taken TWICE daily.  Eliquis may be taken with or without food.   Try to take the dose about the same time in the morning and in the evening. If you have difficulty swallowing the tablet whole please discuss with your pharmacist how to take the medication safely.  Take Eliquis exactly as prescribed and DO NOT stop taking Eliquis without talking to the doctor who prescribed the medication.  Stopping may increase your risk of developing a new blood clot.  Refill your prescription before you run out.  After discharge, you should have regular check-up appointments with your healthcare provider that is prescribing your Eliquis.    What do you do if you miss a dose? If a dose of ELIQUIS is not taken at the scheduled time, take it as soon as possible on the same day and twice-daily administration should be resumed. The dose should not be doubled to make up for a missed dose.  Important Safety Information A possible side effect of Eliquis is bleeding. You should call your healthcare provider right away if you experience any of the following: ? Bleeding from an injury or your nose that does not stop. ? Unusual colored urine (red or dark brown) or unusual colored stools (red or black). ? Unusual  bruising for unknown reasons. ? A serious fall or if you hit your head (even if there is no bleeding).  Some medicines may interact with Eliquis and might increase your risk of bleeding or clotting while on Eliquis. To help avoid this, consult your healthcare provider or pharmacist prior to using any new prescription or non-prescription medications, including herbals, vitamins, non-steroidal anti-inflammatory drugs (NSAIDs) and supplements.  This website has more information on Eliquis (apixaban): http://[email or web address].com/eliquis/home

## 2017-08-20 ENCOUNTER — Ambulatory Visit: Payer: Medicare Other | Admitting: Physician Assistant

## 2017-08-20 LAB — CBC WITH DIFFERENTIAL/PLATELET
Abs Immature Granulocytes: 0.1 10*3/uL (ref 0.0–0.1)
BASOS ABS: 0.1 10*3/uL (ref 0.0–0.1)
BASOS PCT: 1 %
EOS ABS: 0.1 10*3/uL (ref 0.0–0.7)
EOS PCT: 2 %
HCT: 30.1 % — ABNORMAL LOW (ref 39.0–52.0)
Hemoglobin: 9.6 g/dL — ABNORMAL LOW (ref 13.0–17.0)
Immature Granulocytes: 1 %
Lymphocytes Relative: 23 %
Lymphs Abs: 2 10*3/uL (ref 0.7–4.0)
MCH: 31.4 pg (ref 26.0–34.0)
MCHC: 31.9 g/dL (ref 30.0–36.0)
MCV: 98.4 fL (ref 78.0–100.0)
MONO ABS: 0.8 10*3/uL (ref 0.1–1.0)
Monocytes Relative: 9 %
NEUTROS ABS: 5.7 10*3/uL (ref 1.7–7.7)
Neutrophils Relative %: 64 %
PLATELETS: 367 10*3/uL (ref 150–400)
RBC: 3.06 MIL/uL — ABNORMAL LOW (ref 4.22–5.81)
RDW: 15.2 % (ref 11.5–15.5)
WBC: 8.8 10*3/uL (ref 4.0–10.5)

## 2017-08-20 LAB — URINALYSIS, ROUTINE W REFLEX MICROSCOPIC
Bilirubin Urine: NEGATIVE
GLUCOSE, UA: NEGATIVE mg/dL
Ketones, ur: NEGATIVE mg/dL
Nitrite: POSITIVE — AB
PROTEIN: NEGATIVE mg/dL
SPECIFIC GRAVITY, URINE: 1.014 (ref 1.005–1.030)
pH: 7 (ref 5.0–8.0)

## 2017-08-20 LAB — GLUCOSE, CAPILLARY
GLUCOSE-CAPILLARY: 105 mg/dL — AB (ref 70–99)
GLUCOSE-CAPILLARY: 109 mg/dL — AB (ref 70–99)
GLUCOSE-CAPILLARY: 116 mg/dL — AB (ref 70–99)
Glucose-Capillary: 101 mg/dL — ABNORMAL HIGH (ref 70–99)
Glucose-Capillary: 93 mg/dL (ref 70–99)
Glucose-Capillary: 86 mg/dL (ref 70–99)

## 2017-08-20 NOTE — Progress Notes (Addendum)
[facility] PROGRESS NOTE  [name] Cruz ZOX:[id number] DOB: 16-Aug-1955 DOA: 08/09/2017  PCP: [name], MD  Brief History/Interval Summary: 62 year old man was admitted on 7/27 with reports of seizures. Per patients son, he was in line at [employer] and then had garbled speech, spun around and fell to the ground with seizure like activity. EMS reported the patient was post-ictal on arrival to scene. He was initially alert and oriented on arrival to the ED but then had a witnessed generalized tonic-clonic seizure lasting 30 seconds treated with Ativan.  He had a ventricular tachycardia cardiac arrest with CPR in the CT scanner treated with amiodarone and epi.  No cardioversion.  Return of circulation after 10 minutes.  Cardiology consulted.  Prolonged QT noted.  Arrest felt to be secondary to seizure. Started on IV sedation and anticonvulsants to achieve burst suppression. Sedation discontinued 7/30.  Patient's mental status allowed extubation 8/3. Agitated requiring dexmedetomidine.  Transition to tapering dose of lorazepam.  Consultants: Neurology.  Cardiology.  Procedures:   TransThoracic echocardiogram Study Conclusions  - Left ventricle: The cavity size was normal. There was mild focal   basal hypertrophy of the septum. Systolic function was mildly   reduced. The estimated ejection fraction was in the range of 45%   to 50%. Mild diffuse hypokinesis. The study is not technically   sufficient to allow evaluation of LV diastolic function. - Aortic valve: Moderately to severely calcified annulus.   Trileaflet; normal thickness, moderately calcified leaflets.   There was mild regurgitation. - Pulmonary arteries: PA peak pressure: 32 mm Hg (S). - Inferior vena cava: The vessel was mildly dilated.  Antibiotics: None  Subjective/Interval History: Patient pleasantly confused.  Distracted.  Denies any complaints.   Objective:  Vital Signs  Vitals:   08/20/17 1154  08/20/17 1200 08/20/17 1300 08/20/17 1400  BP:  (!) 165/84    Pulse:  92 87 96  Resp:  (!) 24 19 20   Temp: 99 F (37.2 C)     TempSrc: Oral     SpO2:  100% 100% 100%  Weight:      Height:        Intake/Output Summary (Last 24 hours) at 08/20/2017 1453 Last data filed at 08/20/2017 1200 Gross per 24 hour  Intake 1130 ml  Output 1700 ml  Net -570 ml   Filed Weights   08/18/17 0500 08/19/17 0500 08/20/17 0442  Weight: 72 kg (158 lb 11.7 oz) 73.3 kg (161 lb 9.6 oz) 73.2 kg (161 lb 6 oz)    General appearance: alert, cooperative, distracted and no distress Head: Normocephalic, without obvious abnormality, atraumatic Resp: clear to auscultation bilaterally Cardio: regular rate and rhythm, S1, S2 normal, no murmur, click, rub or gallop GI: soft, non-tender; bowel sounds normal; no masses,  no organomegaly Extremities: extremities normal, atraumatic, no cyanosis or edema Pulses: 2+ and symmetric Neurologic: No obvious focal neurological deficits but patient is distracted.  Slow to respond at times.  Oriented to place year.  Lab Results:  Data Reviewed: I have personally reviewed following labs and imaging studies  CBC: Recent Labs  Lab 08/16/17 0240 08/17/17 0305 08/18/17 0311 08/19/17 0505 08/20/17 0432  WBC 7.4 6.6 7.3 7.4 8.8  NEUTROABS 4.3 3.9 4.4 4.5 5.7  HGB 9.6* 9.0* 10.0* 9.5* 9.6*  HCT 30.5* 29.1* 31.6* 29.1* 30.1*  MCV 97.4 99.3 98.1 97.0 98.4  PLT 239 253 305 314 367    Basic Metabolic Panel: Recent Labs  Lab 08/14/17 0310 08/17/17 0305  08/18/17 0311  NA 144 143 142  K 3.5 3.0* 3.5  CL 106 104 106  CO2 28 27 27   GLUCOSE 146* 94 77  BUN 13 19 16   CREATININE 0.59* 0.55* 0.57*  CALCIUM 8.0* 8.5* 8.6*  MG 2.1  --   --     GFR: Estimated Creatinine Clearance: 84 mL/min (A) (by C-G formula based on SCr of 0.57 mg/dL (L)).  CBG: Recent Labs  Lab 08/19/17 2330 08/19/17 2359 08/20/17 0326 08/20/17 0817 08/20/17 1146  GLUCAP 58* 109* 101* 93 86       Recent Results (from the past 240 hour(s))  Culture, respiratory (non-expectorated)     Status: None   Collection Time: 08/14/17 11:08 AM  Result Value Ref Range Status   Specimen Description TRACHEAL ASPIRATE  Final   Special Requests NONE  Final   Gram Stain   Final    MODERATE WBC PRESENT, PREDOMINANTLY PMN FEW GRAM POSITIVE COCCI IN CLUSTERS RARE GRAM NEGATIVE RODS RARE GRAM NEGATIVE DIPLOCOCCI Performed at [facility], [address], [place], [place] [address]    Culture ABUNDANT STAPHYLOCOCCUS AUREUS  Final   Report Status 08/16/2017 FINAL  Final   Organism ID, Bacteria STAPHYLOCOCCUS AUREUS  Final      Susceptibility   Staphylococcus aureus - MIC*    CIPROFLOXACIN <=0.5 SENSITIVE Sensitive     ERYTHROMYCIN <=0.25 SENSITIVE Sensitive     GENTAMICIN <=0.5 SENSITIVE Sensitive     OXACILLIN 0.5 SENSITIVE Sensitive     TETRACYCLINE <=1 SENSITIVE Sensitive     VANCOMYCIN 1 SENSITIVE Sensitive     TRIMETH/SULFA <=10 SENSITIVE Sensitive     CLINDAMYCIN <=0.25 SENSITIVE Sensitive     RIFAMPIN <=0.5 SENSITIVE Sensitive     Inducible Clindamycin NEGATIVE Sensitive     * ABUNDANT STAPHYLOCOCCUS AUREUS      Radiology Studies: No results found.   Medications:  Scheduled: . apixaban  10 mg Oral BID   Followed by  . [START ON 08/26/2017] apixaban  5 mg Oral BID  . aspirin EC  81 mg Oral Daily  . atorvastatin  80 mg Oral Daily  . feeding supplement (ENSURE ENLIVE)  237 mL Oral Q24H  . levETIRAcetam  1,000 mg Oral BID  . multivitamin with minerals  1 tablet Oral Daily  . sodium chloride flush  10-40 mL Intracatheter Q12H  . thiamine  100 mg Oral Daily   Continuous:  UEA:VWUJWJXBJYNWG (TYLENOL) oral liquid 160 mg/5 mL, sodium chloride flush  Assessment/Plan:    Status epilepticus Patient has been stable for the past many days.  Patient was in the intensive care unit.  Transferred to hospitalist care as of this morning.  Currently on Keppra.  Patient was  seen by neurology previously.  Initially they were recommending Dilantin however it appears that intensivist has elected to continue the patient on Keppra.  We will leave the patient on Keppra for now.  Acute metabolic encephalopathy Multifactorial including prolonged seizure activity.  Mental status has been slowly improving.  He did require Precedex briefly.  Currently on on tapering doses of lorazepam.  Status post VT cardiac arrest Seen by cardiology.  No further recurrence of arrhythmia.  Echocardiogram shows EF of 45 to 50%.  Cardiac arrest was in the setting of seizures.  No longer on amiodarone.  No further cardiac work-up recommended by cardiology.  Will need repeat echocardiogram in 2 months or so.  He is followed by Dr. Eldridge Dace.  Acute left upper extremity DVT On  apixaban for 3 months.  History of coronary artery disease status post CABG This was earlier this year.  Seems to be stable at this time.  Continue aspirin.  Beta-blocker on hold due to fluctuating blood pressures.  Follow blood pressure trend and if it remains elevated then could consider restarting his carvedilol.  Continue with his statin.  Hypoglycemia in the setting of poor nutritional status Improved.  Oropharyngeal dysphagia He is on dysphagia 3 diet now.  Speech therapy is following.  Normocytic anemia No evidence of overt bleeding.  Monitor hemoglobin closely.  DVT Prophylaxis: On apixaban    Code Status: Full code Family Communication: No family at bedside Disposition Plan: Seen by PT and OT.  Inpatient rehab is recommended.  Patient seems to be medically stable.  Okay for inpatient rehab when they have a bed available.    LOS: 11 days   [name] Cruz  [facility] Pager [phone] 08/20/2017, 2:53 PM  If 7PM-7AM, please contact night-coverage at [email or web address], password [email or web address]

## 2017-08-20 NOTE — Progress Notes (Signed)
Inpatient Rehabilitation-Admissions Coordinator   Spoke with pt's sister Steward Drone(Brenda) regarding payor source and insurance benefits. Pt would have copay for CIR  [facility] and she reports he would not be able to afford it. Pt's sister also reports she is sure he has Medicaid, however [facility] [employer] as well as billing coverage tab through Epic, no Medicaid policy listed for this patient as far as [facility]. Pt's sister said she will get the Medicaid policy number [facility] tomorrow. AC will follow up with pt and family tomorrow.   Nanine MeansKelly Ramiro Pangilinan, OTR/L  Rehab Admissions Coordinator  [phone] 08/20/2017 3:41 PM

## 2017-08-20 NOTE — Progress Notes (Deleted)
Attempted to pull back on PICC line to draw patient's morning labs and could not get blood return. Lab notified and is to come draw morning labs. Will put in IV Team consult to see about putting tPA through the lines.

## 2017-08-20 NOTE — Progress Notes (Signed)
Chaplain visited with the PT and family.  PT appears to be in high spirits and wanting to go home especially now that he has had his pancakes and coffee.  Family is happy with progress, prayerful and thankful

## 2017-08-20 NOTE — Progress Notes (Signed)
Physical Therapy Treatment Patient Details Name: Cipriano MileBilly J Hawkes MRN: [id number] DOB: 06-May-1955 Today's Date: 08/20/2017    History of Present Illness 62 y/o M who [facility] with reports of seizures.  Per patients son, he was in line at [employer] and then had garbled speech, spun around and fell to the ground with seizure like activity.  EMS reported the patient was post-ictal on arrival to scene.  PMH of large hemorrhagic CVA in 2007 with residual speech difficulties.  The patient was alert/oriented to self & place on arrival to ER.  While in the room, he was witnessed to have altered speech and couldn't tell the staff where he was etc. He then had a seizure and was treated with 1mg  IV ativan.  He relaxed after about 30 seconds.   He was sent for CT head and while in the scanner he had an episode of ventricular tachycardia with arrest.  CPR was initiated and he was returned to the ER.  By the time he arrived to the ER, he was in a narrow complex rhythm without a pulse.  He was not shocked.  CPR total time ~ 10 minutes. Pt treated with amiodarone, EPI x2 and intubated without drugs. During intubation, he seized another time. Initial labs-Na 132, K 3.9, CL 99, CO2 22, glucose 206, BUN 10 / sr cr 0.85, Mg 1.8, WBC 7.9, Hgb 11.2 and platelets 231.  CXR pending.  CT of the head 7/27 showed no acute abnormality, moderate chronic small vessel disease, stable chronic ventriculomegaly compatible with left temporal occipital encephalomalacia / central volume loss.   He was intubated from 08/09/2017-08/16/2017.  Most recent chest xray is showing no active airspace disease.  MRI of the head dated 08/14/2017 is whosing symmetric increased FLAIR and diffusion signoal abnormality involving the medial temporal lobes/hippocampi billaterally most likely sequelae of seizures, 6mm infarct right parietal lobe and sequelae of remote hemorrhagic infarct left parietal/temporal regions as well as mutiple chronic  microhemorrhages.      PT Comments    Pt's mobility and gait stability improved, but still needing minimal assist for safety.  Pt still confused, follows simple direction, does not have an awareness of his deficits or of general safety.  Follow Up Recommendations  CIR     Equipment Recommendations  (TBA)    Recommendations for Other Services Rehab consult     Precautions / Restrictions Precautions Precautions: Fall Precaution Comments: confusion, impaired cognition    Mobility  Bed Mobility Overal bed mobility: Needs Assistance Bed Mobility: Supine to Sit     Supine to sit: Min guard     General bed mobility comments: transitioned to EOB with UE's and no assist  Transfers Overall transfer level: Needs assistance Equipment used: 2 person hand held assist;1 person hand held assist Transfers: Sit to/from Stand Sit to Stand: Min assist;+2 safety/equipment         General transfer comment: pt followed simple commands more consistently today.  Less feel of impulsivity.  Ambulation/Gait Ambulation/Gait assistance: Min assist;+2 physical assistance;+2 safety/equipment Gait Distance (Feet): 300 Feet Assistive device: 1 person hand held assist Gait Pattern/deviations: Step-through pattern Gait velocity: prefers slower, but has ability to speed up minimally Gait velocity interpretation: <1.8 ft/sec, indicate of risk for recurrent falls General Gait Details: pt unsteady overall, improved with mild increases in cadence.  Frequent episodes of scissoring and drifting with scanning or lossess of focus.  Notably worse stability when assist taken away for brief periods.  Stairs             Wheelchair Mobility    Modified Rankin (Stroke Patients Only) Modified Rankin (Stroke Patients Only) Pre-Morbid Rankin Score: Slight disability Modified Rankin: Moderately severe disability     Balance Overall balance assessment: Needs assistance   Sitting balance-Leahy  Scale: Fair       Standing balance-Leahy Scale: Poor Standing balance comment: requiring stability assist whether static or dynamic.                            Cognition Arousal/Alertness: Awake/alert Behavior During Therapy: WFL for tasks assessed/performed Overall Cognitive Status: Impaired/Different from baseline(NT formally)                                        Exercises      General Comments General comments (skin integrity, edema, etc.): vss      Pertinent Vitals/Pain Pain Assessment: Faces Faces Pain Scale: No hurt    Home Living                      Prior Function            PT Goals (current goals can now be found in the care plan section) Acute Rehab PT Goals Patient Stated Goal: didn't state PT Goal Formulation: With patient Time For Goal Achievement: 09/01/17 Potential to Achieve Goals: Fair Progress towards PT goals: Progressing toward goals    Frequency    Min 4X/week      PT Plan Current plan remains appropriate    Co-evaluation              AM-PAC PT "6 Clicks" Daily Activity  Outcome Measure  Difficulty turning over in bed (including adjusting bedclothes, sheets and blankets)?: A Little Difficulty moving from lying on back to sitting on the side of the bed? : A Little Difficulty sitting down on and standing up from a chair with arms (e.g., wheelchair, bedside commode, etc,.)?: Unable Help needed moving to and from a bed to chair (including a wheelchair)?: A Little Help needed walking in hospital room?: A Little Help needed climbing 3-5 steps with a railing? : A Little 6 Click Score: 16    End of Session   Activity Tolerance: Patient tolerated treatment well Patient left: in chair;with call bell/phone within reach;with chair alarm set Nurse Communication: Mobility status PT Visit Diagnosis: Unsteadiness on feet (R26.81)     Time: 1610-9604 PT Time Calculation (min) (ACUTE ONLY): 19  min  Charges:  $Gait Training: 8-22 mins                     08/20/2017  [place] Bing, PT [phone]  (pager)   Eliseo Gum Trini Christiansen 08/20/2017, 6:06 PM

## 2017-08-21 DIAGNOSIS — N390 Urinary tract infection, site not specified: Secondary | ICD-10-CM

## 2017-08-21 LAB — BASIC METABOLIC PANEL
ANION GAP: 10 (ref 5–15)
BUN: 11 mg/dL (ref 8–23)
CO2: 28 mmol/L (ref 22–32)
Calcium: 8.7 mg/dL — ABNORMAL LOW (ref 8.9–10.3)
Chloride: 103 mmol/L (ref 98–111)
Creatinine, Ser: 0.51 mg/dL — ABNORMAL LOW (ref 0.61–1.24)
Glucose, Bld: 88 mg/dL (ref 70–99)
POTASSIUM: 3.4 mmol/L — AB (ref 3.5–5.1)
Sodium: 141 mmol/L (ref 135–145)

## 2017-08-21 LAB — CBC
HCT: 30.5 % — ABNORMAL LOW (ref 39.0–52.0)
Hemoglobin: 9.7 g/dL — ABNORMAL LOW (ref 13.0–17.0)
MCH: 31.3 pg (ref 26.0–34.0)
MCHC: 31.8 g/dL (ref 30.0–36.0)
MCV: 98.4 fL (ref 78.0–100.0)
Platelets: 383 10*3/uL (ref 150–400)
RBC: 3.1 MIL/uL — AB (ref 4.22–5.81)
RDW: 15.1 % (ref 11.5–15.5)
WBC: 11.9 10*3/uL — AB (ref 4.0–10.5)

## 2017-08-21 LAB — GLUCOSE, CAPILLARY
GLUCOSE-CAPILLARY: 82 mg/dL (ref 70–99)
GLUCOSE-CAPILLARY: 86 mg/dL (ref 70–99)
GLUCOSE-CAPILLARY: 113 mg/dL — AB (ref 70–99)

## 2017-08-21 MED ORDER — SODIUM CHLORIDE 0.9 % IV SOLN
1.0000 g | INTRAVENOUS | Status: DC
  Administered 2017-08-21 – 2017-08-22 (×2): 1 g via INTRAVENOUS
  Filled 2017-08-21 (×2): qty 10

## 2017-08-21 MED ORDER — ACETAMINOPHEN 160 MG/5ML PO SOLN: 650.0000 mg | Freq: Three times a day (TID) | ORAL | Status: DC | PRN

## 2017-08-21 MED ORDER — POTASSIUM CHLORIDE CRYS ER 20 MEQ PO TBCR
40.0000 meq | EXTENDED_RELEASE_TABLET | Freq: Once | ORAL | Status: AC
  Administered 2017-08-21: 40 meq via ORAL
  Filled 2017-08-21: qty 2

## 2017-08-21 NOTE — Progress Notes (Addendum)
Occupational Therapy Treatment Patient Details Name: [name] Cruz MRN: [id number] DOB: [birth year] Today's Date: 08/21/2017    History of present illness Patient is a 62 y/o male admitted on 7/27 due to seizure, while in CT had V-tach arrest and further seizures.  Extubated 8/3.  MRI positive for signal abnormality medial temporal lobes and hippocampi due to seizures and R parietal infarct.   OT comments  This 62 yo male seen today for grooming and LBADLs presents to acute OT making gains with pt being at a set up/S level for activities but with min A when in standing for balance. He will continue to benefit from acute OT with follow up on CIR.  Follow Up Recommendations  CIR;Other (comment)(but pt cannot afford co-pays so HHOT with 24 hour S/prn A is next best option)    Equipment Recommendations  3 in 1 bedside commode;Tub/shower seat       Precautions / Restrictions Precautions Precautions: Fall Precaution Comments: seizures Restrictions Weight Bearing Restrictions: No       Mobility Bed Mobility Overal bed mobility: Needs Assistance Bed Mobility: Sit to Supine       Sit to supine: Supervision   General bed mobility comments: Pt up in recliner upon arrival  Transfers Overall transfer level: Needs assistance Equipment used: Rolling walker (2 wheeled) Transfers: Sit to/from Stand Sit to Stand: Min assist         General transfer comment: up from recliner assist for safety/balance    Balance Overall balance assessment: Needs assistance Sitting-balance support: No upper extremity supported;Feet supported Sitting balance-Leahy Scale: Good     Standing balance support: No upper extremity supported Standing balance-Leahy Scale: Fair Standing balance comment: requiring stability assist dynamic.                           ADL either performed or assessed with clinical judgement   ADL Overall ADL's : Needs assistance/impaired     Grooming: Set  up;Supervision/safety;Wash/dry face;Oral care Grooming Details (indicate cue type and reason): with min A for standing at sink             Lower Body Dressing: Minimal assistance;Sit to/from stand                       Vision   Additional Comments: Pt with history of peripheral deficits from prior CVA          Cognition Arousal/Alertness: Awake/alert Behavior During Therapy: WFL for tasks assessed/performed Overall Cognitive Status: Impaired/Different from baseline Area of Impairment: Attention;Following commands;Safety/judgement;Problem solving                 Orientation Level: Disoriented to;Time Current Attention Level: Sustained Memory: Decreased short-term memory Following Commands: Follows one step commands inconsistently;Follows one step commands with increased time Safety/Judgement: Decreased awareness of safety;Decreased awareness of deficits   Problem Solving: Slow processing;Requires verbal cues General Comments: step daughter in room reports pt with some cognitive issues prior to this event due to prior stroke                   Pertinent Vitals/ Pain       Pain Assessment: No/denies pain         Frequency  Min 2X/week        Progress Toward Goals  OT Goals(current goals can now be found in the care plan section)  Progress towards OT goals: Progressing toward goals  Plan Discharge plan remains appropriate       AM-PAC PT "6 Clicks" Daily Activity     Outcome Measure   Help from another person eating meals?: None Help from another person taking care of personal grooming?: A Little Help from another person toileting, which includes using toliet, bedpan, or urinal?: A Little Help from another person bathing (including washing, rinsing, drying)?: A Little Help from another person to put on and taking off regular upper body clothing?: A Little Help from another person to put on and taking off regular lower body clothing?: A  Little 6 Click Score: 19    End of Session Equipment Utilized During Treatment: Gait belt  OT Visit Diagnosis: Unsteadiness on feet (R26.81)   Activity Tolerance Patient tolerated treatment well   Patient Left in chair;with call bell/phone within reach;with chair alarm set   Nurse Communication          Time: [id number] OT Time Calculation (min): 37 min  Charges: OT General Charges $OT Visit: 1 Visit OT Treatments $Self Care/Home Management : 23-37 mins  Ignacia PalmaCathy Zidan Helget, OTR/L [phone] 08/21/2017

## 2017-08-21 NOTE — Progress Notes (Signed)
Inpatient Rehabilitation-Admissions Coordinator   Per CM, pt's [facility] as pt cannot afford the copays for CIR. AC will sign off at this time. Please call if questions.   Nanine MeansKelly Katora Fini, OTR/L  Rehab Admissions Coordinator  [phone] 08/21/2017 4:43 PM

## 2017-08-21 NOTE — Care Management (Signed)
Spoke with pt's sister, Steward DroneBrenda, by phone, to discuss decision for disposition.  Pt does have an active Medicaid plan, but it is a low income plan to assist with Medicare premiums ONLY, and will not help with rehab or SNF copays.  Sister states she or her brother cannot afford these copays, and they choose to take pt home.  She states that her older sister, also a CNA, lives with pt and will care for him at home.  [facility].  Notified MD of change in DC plan, and [facility].  Per notes, pt appears medically stable next 24h.  Anticipate discharge 08/22/17.    Quintella BatonJulie W. Kelcey Korus, RN, BSN  Trauma/Neuro ICU Case Manager [phone]

## 2017-08-21 NOTE — Progress Notes (Signed)
Physical Therapy Treatment Patient Details Name: [name] Cruz MRN: [id number] DOB: [birth year] Today's Date: 08/21/2017    History of Present Illness Patient is a 62 y/o male admitted on 7/27 due to seizure, while in CT had V-tach arrest and further seizures.  Extubated 8/3.  MRI positive for signal abnormality medial temporal lobes and hippocampi due to seizures and R parietal infarct.    PT Comments    Patient still slow cognitively and with some imbalance/general weakness, but according to family in the room cognition approaching baseline since his last stroke.  Currently min A for ambulation without the walker for safety (and he seems more automatic without walker,) but remains fall risk.  Noted family plans for home with 24 hour assist and follow up HHPT.  Feel this is appropriate and would recommend RW for safety.  PT to follow acutely.   Follow Up Recommendations  Home health PT;Supervision/Assistance - 24 hour     Equipment Recommendations  Rolling walker with 5" wheels    Recommendations for Other Services       Precautions / Restrictions Precautions Precautions: Fall Precaution Comments: seizures Restrictions Weight Bearing Restrictions: No    Mobility  Bed Mobility Overal bed mobility: Needs Assistance Bed Mobility: Sit to Supine       Sit to supine: Supervision   General bed mobility comments: Pt up in recliner upon arrival  Transfers Overall transfer level: Needs assistance Equipment used: Rolling walker (2 wheeled) Transfers: Sit to/from Stand Sit to Stand: Min assist         General transfer comment: up from recliner assist for safety/balance  Ambulation/Gait Ambulation/Gait assistance: Min guard;Min assist Gait Distance (Feet): 400 Feet Assistive device: Rolling walker (2 wheeled);None Gait Pattern/deviations: Step-through pattern;Drifts right/left;Trunk flexed     General Gait Details: initially with walker with assist for safety, then no  walker, occasional drifting or LOB with crossover to correct and improved upright posture and speed   Stairs             Wheelchair Mobility    Modified Rankin (Stroke Patients Only) Modified Rankin (Stroke Patients Only) Pre-Morbid Rankin Score: Slight disability Modified Rankin: Moderately severe disability     Balance Overall balance assessment: Needs assistance Sitting-balance support: No upper extremity supported;Feet supported Sitting balance-Leahy Scale: Good     Standing balance support: No upper extremity supported Standing balance-Leahy Scale: Fair Standing balance comment: requiring stability assist dynamic.                            Cognition Arousal/Alertness: Awake/alert Behavior During Therapy: WFL for tasks assessed/performed Overall Cognitive Status: Impaired/Different from baseline Area of Impairment: Attention;Following commands;Safety/judgement;Problem solving                 Orientation Level: Disoriented to;Time Current Attention Level: Sustained Memory: Decreased short-term memory Following Commands: Follows one step commands inconsistently;Follows one step commands with increased time Safety/Judgement: Decreased awareness of safety;Decreased awareness of deficits   Problem Solving: Slow processing;Requires verbal cues General Comments: step daughter in room reports pt with some cognitive issues prior to this event due to prior stroke      Exercises      General Comments        Pertinent Vitals/Pain Pain Assessment: No/denies pain    Home Living Family/patient expects to be discharged to:: Private residence Living Arrangements: Other relatives  Prior Function            PT Goals (current goals can now be found in the care plan section) Progress towards PT goals: Progressing toward goals    Frequency    Min 4X/week      PT Plan Discharge plan needs to be updated     Co-evaluation              AM-PAC PT "6 Clicks" Daily Activity  Outcome Measure  Difficulty turning over in bed (including adjusting bedclothes, sheets and blankets)?: A Little Difficulty moving from lying on back to sitting on the side of the bed? : A Little Difficulty sitting down on and standing up from a chair with arms (e.g., wheelchair, bedside commode, etc,.)?: A Lot Help needed moving to and from a bed to chair (including a wheelchair)?: A Little Help needed walking in hospital room?: A Little Help needed climbing 3-5 steps with a railing? : A Little 6 Click Score: 17    End of Session Equipment Utilized During Treatment: Gait belt Activity Tolerance: Patient tolerated treatment well Patient left: in bed;with call bell/phone within reach;with bed alarm set   PT Visit Diagnosis: Other abnormalities of gait and mobility (R26.89);Other symptoms and signs involving the nervous system (R29.898)     Time: [id number] PT Time Calculation (min) (ACUTE ONLY): 23 min  Charges:  $Gait Training: 23-37 mins                     Laconayndi Wynn, [place] CarolinaPT [phone] 08/21/2017    Elray Mcgregorynthia Wynn 08/21/2017, 4:36 PM

## 2017-08-21 NOTE — Progress Notes (Signed)
[facility] PROGRESS NOTE  [name] Cruz:[id number] DOB: Dec 21, 1955 DOA: 08/09/2017  PCP: [name], MD  Brief History/Interval Summary: 62 year old man was admitted on 7/27 with reports of seizures. Per patients son, he was in line at [employer] and then had garbled speech, spun around and fell to the ground with seizure like activity. EMS reported the patient was post-ictal on arrival to scene. He was initially alert and oriented on arrival to the ED but then had a witnessed generalized tonic-clonic seizure lasting 30 seconds treated with Ativan.  He had a ventricular tachycardia cardiac arrest with CPR in the CT scanner treated with amiodarone and epi.  No cardioversion.  Return of circulation after 10 minutes.  Cardiology consulted.  Prolonged QT noted.  Arrest felt to be secondary to seizure. Started on IV sedation and anticonvulsants to achieve burst suppression. Sedation discontinued 7/30.  Patient's mental status allowed extubation 8/3. Agitated requiring dexmedetomidine.  Transitioned to tapering dose of lorazepam.  Consultants: Neurology.  Cardiology.  Procedures:   TransThoracic echocardiogram Study Conclusions  - Left ventricle: The cavity size was normal. There was mild focal   basal hypertrophy of the septum. Systolic function was mildly   reduced. The estimated ejection fraction was in the range of 45%   to 50%. Mild diffuse hypokinesis. The study is not technically   sufficient to allow evaluation of LV diastolic function. - Aortic valve: Moderately to severely calcified annulus.   Trileaflet; normal thickness, moderately calcified leaflets.   There was mild regurgitation. - Pulmonary arteries: PA peak pressure: 32 mm Hg (S). - Inferior vena cava: The vessel was mildly dilated.  Antibiotics: None  Subjective/Interval History: Patient remains pleasantly confused.  Denies any complaints.  Specifically denies any problems  urinating.   Objective:  Vital Signs  Vitals:   08/21/17 0600 08/21/17 0800 08/21/17 0828 08/21/17 1119  BP:  (!) 159/81    Pulse: 89 96    Resp: (!) 25 19    Temp:   (!) 100.5 F (38.1 C) 99.8 F (37.7 C)  TempSrc:   Oral Oral  SpO2: 100% 100%    Weight:      Height:        Intake/Output Summary (Last 24 hours) at 08/21/2017 1240 Last data filed at 08/21/2017 1120 Gross per 24 hour  Intake 340 ml  Output 1575 ml  Net -1235 ml   Filed Weights   08/19/17 0500 08/20/17 0442 08/21/17 0500  Weight: 73.3 kg 73.2 kg 71.6 kg    General appearance: Awake alert.  Distracted.  In no distress. Resp: Clear to auscultation bilaterally Cardio: S1-S2 normal regular.  No S3-S4.  No rubs murmurs or bruit GI: Abdomen is soft.  Nontender nondistended.  Bowel sounds are present.  No masses organomegaly Extremities: No edema Neurologic: No obvious focal neurological deficits but patient is distracted.  Slow to respond at times.  Oriented to place person.    Lab Results:  Data Reviewed: I have personally reviewed following labs and imaging studies  CBC: Recent Labs  Lab 08/16/17 0240 08/17/17 0305 08/18/17 0311 08/19/17 0505 08/20/17 0432 08/21/17 0536  WBC 7.4 6.6 7.3 7.4 8.8 11.9*  NEUTROABS 4.3 3.9 4.4 4.5 5.7  --   HGB 9.6* 9.0* 10.0* 9.5* 9.6* 9.7*  HCT 30.5* 29.1* 31.6* 29.1* 30.1* 30.5*  MCV 97.4 99.3 98.1 97.0 98.4 98.4  PLT 239 253 305 314 367 383    Basic Metabolic Panel: Recent Labs  Lab 08/17/17 0305 08/18/17 0311  08/21/17 0536  NA 143 142 141  K 3.0* 3.5 3.4*  CL 104 106 103  CO2 27 27 28   GLUCOSE 94 77 88  BUN 19 16 11   CREATININE 0.55* 0.57* 0.51*  CALCIUM 8.5* 8.6* 8.7*    GFR: Estimated Creatinine Clearance: 83.1 mL/min (A) (by C-G formula based on SCr of 0.51 mg/dL (L)).  CBG: Recent Labs  Lab 08/20/17 0817 08/20/17 1146 08/20/17 1650 08/20/17 2144 08/21/17 0823  GLUCAP 93 86 105* 116* 82      Recent Results (from the past 240  hour(s))  Culture, respiratory (non-expectorated)     Status: None   Collection Time: 08/14/17 11:08 AM  Result Value Ref Range Status   Specimen Description TRACHEAL ASPIRATE  Final   Special Requests NONE  Final   Gram Stain   Final    MODERATE WBC PRESENT, PREDOMINANTLY PMN FEW GRAM POSITIVE COCCI IN CLUSTERS RARE GRAM NEGATIVE RODS RARE GRAM NEGATIVE DIPLOCOCCI Performed at [facility], [address] [place], [place] [address]    Culture ABUNDANT STAPHYLOCOCCUS AUREUS  Final   Report Status 08/16/2017 FINAL  Final   Organism ID, Bacteria STAPHYLOCOCCUS AUREUS  Final      Susceptibility   Staphylococcus aureus - MIC*    CIPROFLOXACIN <=0.5 SENSITIVE Sensitive     ERYTHROMYCIN <=0.25 SENSITIVE Sensitive     GENTAMICIN <=0.5 SENSITIVE Sensitive     OXACILLIN 0.5 SENSITIVE Sensitive     TETRACYCLINE <=1 SENSITIVE Sensitive     VANCOMYCIN 1 SENSITIVE Sensitive     TRIMETH/SULFA <=10 SENSITIVE Sensitive     CLINDAMYCIN <=0.25 SENSITIVE Sensitive     RIFAMPIN <=0.5 SENSITIVE Sensitive     Inducible Clindamycin NEGATIVE Sensitive     * ABUNDANT STAPHYLOCOCCUS AUREUS      Radiology Studies: No results found.   Medications:  Scheduled: . apixaban  10 mg Oral BID   Followed by  . [START ON 08/26/2017] apixaban  5 mg Oral BID  . aspirin EC  81 mg Oral Daily  . atorvastatin  80 mg Oral Daily  . feeding supplement (ENSURE ENLIVE)  237 mL Oral Q24H  . levETIRAcetam  1,000 mg Oral BID  . multivitamin with minerals  1 tablet Oral Daily  . sodium chloride flush  10-40 mL Intracatheter Q12H  . thiamine  100 mg Oral Daily   Continuous: . cefTRIAXone (ROCEPHIN)  IV     YNW:[id number]:acetaminophen (TYLENOL) oral liquid 160 mg/5 mL, sodium chloride flush  Assessment/Plan:    Status epilepticus Patient has been stable for the past many days.  Patient was in the intensive care unit. Currently on Keppra.  Patient was seen by neurology previously.  Initially they were recommending  Dilantin however it appears that intensivist elected to continue the patient on Keppra.  We will leave the patient on Keppra for now.  Acute metabolic encephalopathy Multifactorial including prolonged seizure activity.  Mental status has been slowly improving.  He did require Precedex briefly.  And then he was transitioned to tapering doses of lorazepam.  Status post VT cardiac arrest Seen by cardiology.  No further recurrence of arrhythmia.  Echocardiogram shows EF of 45 to 50%.  Cardiac arrest was in the setting of seizures.  No longer on amiodarone.  No further cardiac work-up recommended by cardiology.  Will need repeat echocardiogram in 2 months or so.  He is followed by Dr. Eldridge DaceVaranasi.  Acute left upper extremity DVT He will need apixaban for 3 months.  Abnormal UA/UTI  Unclear why UA was done.  Patient with low-grade fever this morning.  Unable to determine if the UA is clinically significant or not.  We will treat him with ceftriaxone for 3 days.  History of coronary artery disease status post CABG This was earlier this year.  Seems to be stable at this time.  Continue aspirin.  Beta-blocker on hold due to fluctuating blood pressures.  Follow blood pressure trend and if it remains elevated then could consider restarting his carvedilol.  Continue with his statin.  Hypoglycemia in the setting of poor nutritional status Improved.  Oropharyngeal dysphagia He is on dysphagia 3 diet.  Speech therapy is following.  Normocytic anemia No evidence of overt bleeding.  Hemoglobin has been stable.  DVT Prophylaxis: On apixaban    Code Status: Full code Family Communication: No family at bedside Disposition Plan: Seen by PT and OT.  Inpatient rehab is recommended.  Patient seems to be medically stable.  Okay for inpatient rehab when they have a bed available.    LOS: 12 days   Osvaldo Shipper  [facility] Pager [phone] 08/21/2017, 12:40 PM  If 7PM-7AM, please contact  night-coverage at [email or web address], password [facility]

## 2017-08-21 NOTE — Care Management Note (Signed)
Case Management Note  Patient Details  Name: [name] Cruz MRN: [id number] Date of Birth: 1955-08-27  Subjective/Objective:   Pt admitted post seizure - arrested after admit              Action/Plan:  Pt independent from home.  Pt remains ventilated.  CM will continue to follow  Pt extubated on 08/16/17.  PT/OT recommending CIR, but family unable to pay rehab copays and are refusing SNF.  [facility] and family support.    Expected Discharge Date:  08/22/17               Expected Discharge Plan:  Home w Home Health Services  In-House Referral:  Clinical Social Work  Discharge planning Services  CM Consult  Post Acute Care Choice:  Home Health Choice offered to:  [facility] / Guardian, Sibling  DME Arranged:  Walker rolling DME Agency:  [facility]  HH Arranged[facility]  [facility] PT, [facility]  [facility]  Status of Service:  Completed, signed off  If discussed at Long Length of Stay Meetings, dates discussed:    Additional Comments:   [name] Cruz, [name] Eckert M, RN 08/21/2017, 4:52 PM

## 2017-08-22 LAB — GLUCOSE, CAPILLARY: Glucose-Capillary: 100 mg/dL — ABNORMAL HIGH (ref 70–99)

## 2017-08-22 MED ORDER — LEVETIRACETAM 1000 MG PO TABS: 1000.0000 mg | ORAL_TABLET | Freq: Two times a day (BID) | ORAL | 1 refills | Status: DC

## 2017-08-22 MED ORDER — APIXABAN 5 MG PO TABS: 10.0000 mg | ORAL_TABLET | Freq: Two times a day (BID) | ORAL | 0 refills | Status: DC

## 2017-08-22 MED ORDER — ASPIRIN 81 MG PO TBEC: 81.0000 mg | DELAYED_RELEASE_TABLET | Freq: Every day | ORAL | 0 refills | Status: DC

## 2017-08-22 MED ORDER — THIAMINE HCL 100 MG PO TABS: 100.0000 mg | ORAL_TABLET | Freq: Every day | ORAL | 0 refills | Status: DC

## 2017-08-22 MED ORDER — APIXABAN 5 MG PO TABS: 5.0000 mg | ORAL_TABLET | Freq: Two times a day (BID) | ORAL | 2 refills | Status: DC

## 2017-08-22 MED ORDER — ADULT MULTIVITAMIN W/MINERALS CH: 1.0000 | ORAL_TABLET | Freq: Every day | ORAL | 0 refills | Status: DC

## 2017-08-22 MED ORDER — CEPHALEXIN 500 MG PO CAPS: 500.0000 mg | ORAL_CAPSULE | Freq: Three times a day (TID) | ORAL | 0 refills | Status: AC

## 2017-08-22 NOTE — Progress Notes (Signed)
Pt was discharged to home at 1100, wheeled off unit by NA. VS and assessment stable. PICC line removed. Discharge and medication teaching provided to pt and mother, who verbalized understanding.

## 2017-08-22 NOTE — Discharge Summary (Signed)
[facility]  Physician Discharge Summary   Patient ID: [name] Cruz MRN: [id number] DOB/AGE: [birth year] 62 y.o.  Admit date: 08/09/2017 Discharge date: 08/22/2017  PCP: [name], MD  DISCHARGE DIAGNOSES:  Seizure disorder Cardiac arrest Chronic systolic CHF  RECOMMENDATIONS FOR OUTPATIENT FOLLOW UP: 1. Close follow-up with PCP 2. Outpatient follow-up with cardiology    DISCHARGE CONDITION: fair  Diet recommendation: As before  Filed Weights   08/19/17 0500 08/20/17 0442 08/21/17 0500  Weight: 73.3 kg 73.2 kg 71.6 kg    INITIAL HISTORY: 62 year old man was admitted on 7/27 with reports of seizures. Per patients son, he was in line at [employer] and then had garbled speech, spun around and fell to the ground with seizure like activity. EMS reported the patient was post-ictal on arrival to scene. He was initially alert and oriented on arrival to the ED but then had a witnessed generalized tonic-clonic seizure lasting 30 seconds treated with Ativan. He had a ventricular tachycardia cardiac arrest with CPR in the CT scanner treated with amiodarone and epi. No cardioversion. Return of circulation after 10 minutes. Cardiology consulted. Prolonged QT noted. Arrest felt to be secondary to seizure. Started on IV sedation and anticonvulsants to achieve burst suppression. Sedation discontinued 7/30. Patient's mental status allowed extubation 8/3. Agitated requiring dexmedetomidine. Transitioned to tapering dose of lorazepam.  Consultants: Neurology.  Cardiology.  Procedures:   TransThoracic echocardiogram Study Conclusions  - Left ventricle: The cavity size was normal. There was mild focal basal hypertrophy of the septum. Systolic function was mildly reduced. The estimated ejection fraction was in the range of 45% to 50%. Mild diffuse hypokinesis. The study is not technically sufficient to allow evaluation of LV diastolic function. - Aortic  valve: Moderately to severely calcified annulus. Trileaflet; normal thickness, moderately calcified leaflets. There was mild regurgitation. - Pulmonary arteries: PA peak pressure: 32 mm Hg (S). - Inferior vena cava: The vessel was mildly dilated.   HOSPITAL COURSE:   Status epilepticus Patient was admitted with status epilepticus.  He was initially in the intensive care unit.  Had to be intubated for airway protection.  He was started on Keppra.  He was seen by neurology.  He will be discharged on Keppra.  Dilantin has been discontinued.  Has been seizure-free for the last several days.  Acute metabolic encephalopathy Multifactorial including prolonged seizure activity.  Mental status has been slowly improving.  He did require Precedex briefly.  And then he was transitioned to tapering doses of lorazepam.  According to his mother his mental status is back to baseline.  Status post VT cardiac arrest/chronic systolic CHF Seen by cardiology.  No further recurrence of arrhythmia.  Echocardiogram shows EF of 45 to 50%.  Cardiac arrest was in the setting of seizures.  No longer on amiodarone.  No further cardiac work-up recommended by cardiology.  Will need repeat echocardiogram in 2 months or so.  He is followed by Dr. Eldridge Dace.  Message sent to cardiology office to arrange follow-up.  Acute left upper extremity DVT This is thought to be a provoked episode.  He will need apixaban for 3 months.  Abnormal UA/UTI Unclear why UA was done.  Patient with low-grade fever.  Unable to determine if the UA is clinically significant or not.    He was started on ceftriaxone.  Fever has subsided.  Keflex for 3 more days.    History of coronary artery disease status post CABG This was earlier this year.  Seems to be stable  at this time.  Continue aspirin.  Beta-blocker on hold due to fluctuating blood pressures. Continue with his statin.  Could consider resuming carvedilol in the outpatient setting.   Will defer to his PCP and cardiologist.  Hypoglycemia in the setting of poor nutritional status Improved.  Oropharyngeal dysphagia He is on dysphagia 3 diet.  He is tolerating this well.  Home health.  Normocytic anemia No evidence of overt bleeding.  Hemoglobin has been stable.  Overall stable.  Initially plan was for him to go to inpatient rehab however patient cannot afford his co-pays.  So he and his family have elected for him to go home.  Home health has been ordered.  Okay for discharge today.    PERTINENT LABS:  The results of significant diagnostics from this hospitalization (including imaging, microbiology, ancillary and laboratory) are listed below for reference.    Microbiology: Recent Results (from the past 240 hour(s))  Culture, respiratory (non-expectorated)     Status: None   Collection Time: 08/14/17 11:08 AM  Result Value Ref Range Status   Specimen Description TRACHEAL ASPIRATE  Final   Special Requests NONE  Final   Gram Stain   Final    MODERATE WBC PRESENT, PREDOMINANTLY PMN FEW GRAM POSITIVE COCCI IN CLUSTERS RARE GRAM NEGATIVE RODS RARE GRAM NEGATIVE DIPLOCOCCI Performed at [facility], [address] [place], [place] [address]    Culture ABUNDANT STAPHYLOCOCCUS AUREUS  Final   Report Status 08/16/2017 FINAL  Final   Organism ID, Bacteria STAPHYLOCOCCUS AUREUS  Final      Susceptibility   Staphylococcus aureus - MIC*    CIPROFLOXACIN <=0.5 SENSITIVE Sensitive     ERYTHROMYCIN <=0.25 SENSITIVE Sensitive     GENTAMICIN <=0.5 SENSITIVE Sensitive     OXACILLIN 0.5 SENSITIVE Sensitive     TETRACYCLINE <=1 SENSITIVE Sensitive     VANCOMYCIN 1 SENSITIVE Sensitive     TRIMETH/SULFA <=10 SENSITIVE Sensitive     CLINDAMYCIN <=0.25 SENSITIVE Sensitive     RIFAMPIN <=0.5 SENSITIVE Sensitive     Inducible Clindamycin NEGATIVE Sensitive     * ABUNDANT STAPHYLOCOCCUS AUREUS     Labs: Basic Metabolic Panel: Recent Labs  Lab 08/17/17 0305  08/18/17 0311 08/21/17 0536  NA 143 142 141  K 3.0* 3.5 3.4*  CL 104 106 103  CO2 27 27 28   GLUCOSE 94 77 88  BUN 19 16 11   CREATININE 0.55* 0.57* 0.51*  CALCIUM 8.5* 8.6* 8.7*   CBC: Recent Labs  Lab 08/16/17 0240 08/17/17 0305 08/18/17 0311 08/19/17 0505 08/20/17 0432 08/21/17 0536  WBC 7.4 6.6 7.3 7.4 8.8 11.9*  NEUTROABS 4.3 3.9 4.4 4.5 5.7  --   HGB 9.6* 9.0* 10.0* 9.5* 9.6* 9.7*  HCT 30.5* 29.1* 31.6* 29.1* 30.1* 30.5*  MCV 97.4 99.3 98.1 97.0 98.4 98.4  PLT 239 253 305 314 367 383    CBG: Recent Labs  Lab 08/20/17 2144 08/21/17 0823 08/21/17 1557 08/21/17 2102 08/22/17 0756  GLUCAP 116* 82 86 113* 100*     IMAGING STUDIES Ct Head Wo Contrast  Result Date: 08/09/2017 CLINICAL DATA:  New onset seizures.  Fall to ground. EXAM: CT HEAD WITHOUT CONTRAST TECHNIQUE: Contiguous axial images were obtained from the base of the skull through the vertex without intravenous contrast. COMPARISON:  04/26/2012 head CT. FINDINGS: Brain: Chronic ventriculomegaly involving the left greater than right lateral ventricles, unchanged since 2014 head CT, compatible with left-sided temporal occipital encephalomalacia and central volume loss. Stable coarse focal plaque-like calcification  at the lateral margin of the left lateral ventricle. No evidence of parenchymal hemorrhage or extra-axial fluid collection. No mass lesion, mass effect, or midline shift. No CT evidence of acute infarction. Nonspecific moderate subcortical and periventricular white matter hypodensity, most in keeping with chronic small vessel ischemic change. Vascular: No acute abnormality. Skull: No evidence of calvarial fracture. Sinuses/Orbits: The visualized paranasal sinuses are essentially clear. Other: Stable chronic partial left mastoid effusion. Clear right mastoid air cells. IMPRESSION: 1. No evidence of acute intracranial abnormality. No evidence of calvarial fracture. 2. Stable chronic ventriculomegaly compatible  with left temporal occipital encephalomalacia and central volume loss. 3. Moderate chronic small vessel ischemic changes in the cerebral white matter. Electronically Signed   By: Delbert Phenix M.D.   On: 08/09/2017 14:16   Mr Brain Wo Contrast  Result Date: 08/14/2017 CLINICAL DATA:  Initial evaluation status post cardiac arrest. Inciting event likely seizure. History of prior hemorrhagic stroke. EXAM: MRI HEAD WITHOUT CONTRAST TECHNIQUE: Multiplanar, multiecho pulse sequences of the brain and surrounding structures were obtained without intravenous contrast. COMPARISON:  Prior CT from 08/09/2017 as well as previous MRI from 04/27/12. FINDINGS: Brain: Generalized age-related cerebral atrophy. Confluent T2/FLAIR hyperintensity within the periventricular deep white matter both cerebral hemispheres most consistent with chronic small vessel ischemic change, moderate nature. Superimposed small remote lacunar infarcts at the right basal ganglia and thalamus. Chronic encephalomalacia at the left periatrial region and left temporal lobe with associated chronic hemosiderin staining, consistent with prior hemorrhagic infarction in this region. Associated ex vacuo dilatation of the left lateral ventricle, similar to previous. Multiple DISH in ule subcentimeter chronic micro hemorrhages seen involving the supratentorial and infratentorial brain, suspected to be related to chronic underlying hypertension. These appear progressed as compared to previous study. Single punctate 6 mm focus of diffusion abnormality at the cortical/subcortical region of the right parietal lobe (series 5, image 61), suspicious for a small acute ischemic infarct (series 5, image 61). No associated hemorrhage. No other evidence for acute or subacute infarction. Fairly symmetric increased FLAIR signal abnormality with associated diffusion signal seen involving the mesial temporal lobes/hippocampi bilaterally, likely reflecting sequelae of seizure (series  7, image 49). No mass lesion. No midline shift or mass effect. Ventricular prominence related to global parenchymal volume loss and ex vacuo dilatation without hydrocephalus. No extra-axial fluid collection. Pituitary gland within normal limits. Vascular: Major intracranial vascular flow voids maintained Skull and upper cervical spine: Craniocervical junction normal. Bone marrow signal intensity diffusely decreased on T1 weighted sequence within the upper cervical spine, most commonly related to anemia, smoking, or obesity. No focal marrow replacing lesion. No scalp soft tissue abnormality. Sinuses/Orbits: Globes and orbital soft tissues demonstrate no acute finding. Fluid seen layering within the nasopharynx. Patient is intubated. Bilateral mastoid effusions noted. Other: None. IMPRESSION: 1. Symmetric increased FLAIR and diffusion signal abnormality involving the mesial temporal lobes/hippocampi bilaterally. Given history, changes most likely reflect sequelae of seizure. 2. 6 mm acute ischemic right parietal cortical/subcortical infarct. 3. Sequelae of remote hemorrhagic infarction at the left periatrial/temporal region, stable. 4. Multiple additional chronic micro hemorrhages scattered throughout the brain, likely related to chronic underlying hypertension. These appear progressed relative to 2014. 5. Atrophy with moderate chronic small vessel ischemic disease. Electronically Signed   By: Rise Mu M.D.   On: 08/14/2017 02:12   Dg Chest Port 1 View  Result Date: 08/15/2017 CLINICAL DATA:  Endotracheal tube placement EXAM: PORTABLE CHEST 1 VIEW COMPARISON:  08/13/2017 chest radiograph FINDINGS: Unchanged position of endotracheal tube  and nasogastric tube. Sequelae of remote CABG and median sternotomy. Cardiomediastinal contours are normal. No focal airspace consolidation or pulmonary edema. No pneumothorax or sizable pleural effusion. IMPRESSION: Unchanged appearance of support apparatus. No acute  airspace disease. Electronically Signed   By: Deatra RobinsonKevin  Herman M.D.   On: 08/15/2017 17:38   Dg Chest Port 1 View  Result Date: 08/13/2017 CLINICAL DATA:  Respiratory failure EXAM: PORTABLE CHEST 1 VIEW COMPARISON:  08/12/2017 FINDINGS: Endotracheal tube, NG tube and right central line remain in place, unchanged. Mild cardiomegaly. Diffuse airspace disease throughout the right lung. No confluent opacity on the left. Possible small right effusion. No acute bony abnormality. IMPRESSION: Small right effusion. Diffuse airspace disease throughout the right lung, slightly increased since prior study. Low lung volumes. Electronically Signed   By: Charlett NoseKevin  Dover M.D.   On: 08/13/2017 07:33   Dg Chest Port 1 View  Result Date: 08/12/2017 CLINICAL DATA:  ET tube, respiratory failure, hypoxia EXAM: PORTABLE CHEST 1 VIEW COMPARISON:  08/11/2017 FINDINGS: Right central line, endotracheal tube, NG tube remain in place, unchanged. Continued layering right effusion with right base atelectasis or infiltrate. No confluent opacity on the left. Mild cardiomegaly. Prior CABG. IMPRESSION: Continue small right effusion and right lower lobe atelectasis or infiltrate. No pneumothorax. No change. Electronically Signed   By: Charlett NoseKevin  Dover M.D.   On: 08/12/2017 09:14   Dg Chest Port 1 View  Result Date: 08/11/2017 CLINICAL DATA:  Inhibition EXAM: PORTABLE CHEST 1 VIEW COMPARISON:  Chest x-rays dated 08/10/2017 and 08/09/2017. FINDINGS: Endotracheal tube is well positioned approximately 3 cm above the carina. Enteric tube passes below the diaphragm. RIGHT IJ central line is well positioned with tip at the level of the lower SVC/cavoatrial junction. Pacer pads overlie the lower heart. Lungs appear clear.  No pleural effusion or pneumothorax seen. IMPRESSION: 1. Support apparatus appears appropriately positioned. 2. No active disease.  Lungs appear clear. Electronically Signed   By: Bary RichardStan  Maynard M.D.   On: 08/11/2017 14:28   Dg Chest  Port 1 View  Result Date: 08/10/2017 CLINICAL DATA:  Status post cardiac arrest EXAM: PORTABLE CHEST 1 VIEW COMPARISON:  08/09/2017 FINDINGS: Cardiac shadow is stable. Right jugular central line, endotracheal tube and nasogastric catheter are again seen and stable. The lungs are well aerated bilaterally. No focal infiltrate or sizable effusion is seen. IMPRESSION: No active disease. Electronically Signed   By: Alcide CleverMark  Lukens M.D.   On: 08/10/2017 07:17   Dg Chest Port 1 View  Result Date: 08/09/2017 CLINICAL DATA:  Post cardiac arrest. EXAM: PORTABLE CHEST 1 VIEW COMPARISON:  August 09, 2017 FINDINGS: An ET tube terminates 2.6 cm above the carina in good position. A new right central line is noted terminating in the central SVC. No pneumothorax. A transcutaneous pacer partially obscures the right chest. An NG tube terminates below today's film. The heart, hila, and mediastinum are unchanged. Mild interstitial prominence without overt edema. No focal infiltrate. IMPRESSION: 1. Support apparatus as above. 2. No pneumothorax after right central line placement. 3. No other acute abnormalities. Electronically Signed   By: Gerome Samavid  Williams III M.D   On: 08/09/2017 16:29   Dg Chest Portable 1 View  Result Date: 08/09/2017 CLINICAL DATA:  Seizures.  Status post intubation. EXAM: PORTABLE CHEST 1 VIEW COMPARISON:  07/12/2017 and prior radiographs FINDINGS: An endotracheal tube is identified approximately 4 cm above the carina. An NG tube is noted entering the stomach. Cardiomegaly and CABG changes again noted. Overlying CPR board artifact obscures  and limits evaluation. No definite pulmonary opacity, pleural effusion or gross pneumothorax. No acute bony abnormalities identified. IMPRESSION: Endotracheal tube and NG tube. No definite acute cardiopulmonary disease. Cardiomegaly. Electronically Signed   By: Harmon Pier M.D.   On: 08/09/2017 15:03   Dg Abd Portable 1v  Result Date: 08/11/2017 CLINICAL DATA:  Feeding  tube placement. EXAM: PORTABLE ABDOMEN - 1 VIEW COMPARISON:  None. FINDINGS: Enteric tube is positioned in the stomach. Visualized bowel gas pattern is nonobstructive. No evidence of free intraperitoneal air seen. IMPRESSION: Feeding tube in the stomach with tip directed towards the gastric antrum. Electronically Signed   By: Bary Richard M.D.   On: 08/11/2017 14:29   [place] Ekg Site Rite  Result Date: 08/17/2017 If Site Rite image not attached, placement could not be confirmed due to current cardiac rhythm.   DISCHARGE EXAMINATION: Vitals:   08/22/17 0800 08/22/17 0900 08/22/17 1000 08/22/17 1100  BP: (!) 145/64     Pulse: 82 88 93 91  Resp: (!) 23 16 14  (!) 23  Temp:      TempSrc:      SpO2: 99% 100% 100% 100%  Weight:      Height:       General appearance: alert, cooperative and no distress Resp: clear to auscultation bilaterally Cardio: regular rate and rhythm, S1, S2 normal, no murmur, click, rub or gallop GI: soft, non-tender; bowel sounds normal; no masses,  no organomegaly  DISPOSITION: Home with home health  Discharge Instructions    Call MD for:  difficulty breathing, headache or visual disturbances   Complete by:  As directed    Call MD for:  extreme fatigue   Complete by:  As directed    Call MD for:  persistant dizziness or light-headedness   Complete by:  As directed    Call MD for:  persistant nausea and vomiting   Complete by:  As directed    Call MD for:  severe uncontrolled pain   Complete by:  As directed    Call MD for:  temperature >100.4   Complete by:  As directed    Diet - low sodium heart healthy   Complete by:  As directed    Discharge instructions   Complete by:  As directed    Please be sure to follow-up with your PCP within 1 week.  You were cared for by a hospitalist during your hospital stay. If you have any questions about your discharge medications or the care you received while you were in the hospital after you are discharged, you can call  the unit and asked to speak with the hospitalist on call if the hospitalist that took care of you is not available. Once you are discharged, your primary care physician will handle any further medical issues. Please note that NO REFILLS for any discharge medications will be authorized once you are discharged, as it is imperative that you return to your primary care physician (or establish a relationship with a primary care physician if you do not have one) for your aftercare needs so that they can reassess your need for medications and monitor your lab values. If you do not have a primary care physician, you can call [phone] for a physician referral.   Increase activity slowly   Complete by:  As directed         Allergies as of 08/22/2017   No Known Allergies     Medication List    STOP taking these medications  carvedilol 3.125 MG tablet Commonly known as:  COREG   losartan 25 MG tablet Commonly known as:  COZAAR   oxyCODONE 5 MG immediate release tablet Commonly known as:  Oxy IR/ROXICODONE   phenytoin 100 MG ER capsule Commonly known as:  DILANTIN     TAKE these medications   apixaban 5 MG Tabs tablet Commonly known as:  ELIQUIS Take 2 tablets (10 mg total) by mouth 2 (two) times daily for 4 days.   apixaban 5 MG Tabs tablet Commonly known as:  ELIQUIS Take 1 tablet (5 mg total) by mouth 2 (two) times daily. Start taking after completing the 10mg  dose. Start taking on:  08/26/2017   aspirin 81 MG EC tablet Take 1 tablet (81 mg total) by mouth daily. What changed:    medication strength  how much to take   atorvastatin 80 MG tablet Commonly known as:  LIPITOR TAKE 1 TABLET (80 MG TOTAL) BY MOUTH DAILY. FOR CHOLESTEROL What changed:    how much to take  how to take this  when to take this  additional instructions   cephALEXin 500 MG capsule Commonly known as:  KEFLEX Take 1 capsule (500 mg total) by mouth 3 (three) times daily for 3 days.   ezetimibe 10  MG tablet Commonly known as:  ZETIA Take 1 tablet (10 mg total) by mouth daily.   levETIRAcetam 1000 MG tablet Commonly known as:  KEPPRA Take 1 tablet (1,000 mg total) by mouth 2 (two) times daily.   multivitamin with minerals Tabs tablet Take 1 tablet by mouth daily.   thiamine 100 MG tablet Take 1 tablet (100 mg total) by mouth daily.            Durable Medical Equipment  (From admission, onward)         Start     Ordered   08/21/17 1649  For home use only DME Walker rolling  Once    Question:  Patient needs a walker to treat with the following condition  Answer:  Parietal lobe infarction [facility])   08/21/17 1649           Follow-up Information    [name], MD. Schedule an appointment as soon as possible for a visit in 1 week(s).   Specialty:  Family Medicine Contact information: [address] [place] [address] [phone]        Corky Crafts, MD. Schedule an appointment as soon as possible for a visit in 3 week(s).   Specialties:  Cardiology, Radiology, Interventional Cardiology Why:  His office should call you with follow-up appointment.  If you do not hear anything in 1 week please call them. Contact information: [address] [place] [address] [phone]           TOTAL DISCHARGE TIME: 35 mins  Osvaldo Shipper  [facility] Pager [phone]  08/22/2017, 1:53 PM

## 2017-08-23 ENCOUNTER — Encounter: Payer: Self-pay | Admitting: Family Medicine

## 2017-08-23 DIAGNOSIS — I639 Cerebral infarction, unspecified: Secondary | ICD-10-CM | POA: Insufficient documentation

## 2017-08-25 ENCOUNTER — Telehealth: Payer: Self-pay | Admitting: Family Medicine

## 2017-08-25 NOTE — Telephone Encounter (Signed)
Spoke to pts sister Steward DroneBrenda, who states they are not wanting to complete TCM. She states they are unpleased with Dr Sharen HonesGutierrez and is not wanting to proceed further until finding out whether they can be seen within the practice, and if not, pt [facility]

## 2017-08-25 NOTE — Telephone Encounter (Signed)
I think if he is unhappy, they should seek care elsewhere. Dr. Sharen HonesGutierrez treated pt appropriately in my opinion.

## 2017-08-25 NOTE — Telephone Encounter (Signed)
Pt mother, Dot Roseanne RenoStewart, called to schedule hospital follow up for recent stay. Dot refuses to set up HFU with Dr. Reece AgarG, says he took patient off of a medication that caused the cardiac arrest and insists on seeing Nicki [facility]regina Baity, NP only. Pt was advised to see Dr Reece AgarG for HFU and [facility] however Dot refused to set up appt with Dr. Reece AgarG and is aware a TOC appt would be about a month out with R Baity if agreed.  Is it OK to [facility] care [facility] and if so can this patient get worked in for HFU?

## 2017-08-25 NOTE — Telephone Encounter (Signed)
I'm sorry he's not happy with care here. If they don't want to see me I don't recommend they schedule f/u with me. May cancel upcoming appts with me next month. [facility].   Medication in question is phenytoin - this was started 2014 after alcohol associated seizure, and was stopped during prior hospitalization 02/2017 for CABG. I had him remain off of it as he had also stopped drinking at that time and had been seizure free for 5 years, with episodes of not taking phenytoin in interim. He did not follow up with neurology as previously asked.

## 2017-09-03 ENCOUNTER — Encounter: Payer: Self-pay | Admitting: Family Medicine

## 2017-09-03 ENCOUNTER — Ambulatory Visit (INDEPENDENT_AMBULATORY_CARE_PROVIDER_SITE_OTHER): Payer: Medicare Other | Admitting: Family Medicine

## 2017-09-03 VITALS — BP 120/78 | HR 71 | Temp 98.7°F | Ht 63.0 in | Wt 153.5 lb

## 2017-09-03 DIAGNOSIS — I10 Essential (primary) hypertension: Secondary | ICD-10-CM

## 2017-09-03 DIAGNOSIS — G40909 Epilepsy, unspecified, not intractable, without status epilepticus: Secondary | ICD-10-CM | POA: Diagnosis not present

## 2017-09-03 DIAGNOSIS — G40901 Epilepsy, unspecified, not intractable, with status epilepticus: Secondary | ICD-10-CM

## 2017-09-03 DIAGNOSIS — G9389 Other specified disorders of brain: Secondary | ICD-10-CM | POA: Diagnosis not present

## 2017-09-03 DIAGNOSIS — I480 Paroxysmal atrial fibrillation: Secondary | ICD-10-CM

## 2017-09-03 DIAGNOSIS — I639 Cerebral infarction, unspecified: Secondary | ICD-10-CM | POA: Diagnosis not present

## 2017-09-03 DIAGNOSIS — E785 Hyperlipidemia, unspecified: Secondary | ICD-10-CM

## 2017-09-03 DIAGNOSIS — I82622 Acute embolism and thrombosis of deep veins of left upper extremity: Secondary | ICD-10-CM

## 2017-09-03 DIAGNOSIS — I6930 Unspecified sequelae of cerebral infarction: Secondary | ICD-10-CM | POA: Diagnosis not present

## 2017-09-03 DIAGNOSIS — R4189 Other symptoms and signs involving cognitive functions and awareness: Secondary | ICD-10-CM

## 2017-09-03 DIAGNOSIS — F1010 Alcohol abuse, uncomplicated: Secondary | ICD-10-CM

## 2017-09-03 DIAGNOSIS — I25810 Atherosclerosis of coronary artery bypass graft(s) without angina pectoris: Secondary | ICD-10-CM

## 2017-09-03 DIAGNOSIS — I469 Cardiac arrest, cause unspecified: Secondary | ICD-10-CM

## 2017-09-03 MED ORDER — LEVETIRACETAM 1000 MG PO TABS: 1000.0000 mg | ORAL_TABLET | Freq: Two times a day (BID) | ORAL | 3 refills | Status: DC

## 2017-09-03 NOTE — Assessment & Plan Note (Signed)
Initial seizure 2014 after abrupt discontinuation of alcohol. Started on phenytoin at that time. Did not f/u neurology. Took phenytoin from 2014 through 03/2017, seizure free. At that time he stopped drinking alcohol, but subesquently restarted. Likely prior CVA also increased seizure risk. Will need longterm AED - now on keppra 1000mg  bid. Given hx, will refer to neurology for evaluation.

## 2017-09-03 NOTE — Patient Instructions (Addendum)
Cancel appt with me 09/17/2017.  Keep an eye on blood pressure at home and let me know if your blood pressure starts rising >140/90.  We will refer you to neurologist - see our referral coordinators today.  Take eliquis (apixaban) for a full 3 months then may stop (11/2017).  Pick up eliquis and keppra prescriptions today.  Good to see you today. Return after January 15th 2020 for wellness visit and physical.

## 2017-09-03 NOTE — Assessment & Plan Note (Signed)
L brachial and basilic veins - on eliquis, discussed duration of 3 months. Healing well.   Caution in h/o hemorrhagic stroke.

## 2017-09-03 NOTE — Assessment & Plan Note (Signed)
Chronic, continue lipitor and zetia.  The ASCVD Risk score [name] Cruz(Goff DC Jr., et al., 2013) failed to calculate for the following reasons:   The patient has a prior MI or stroke diagnosis

## 2017-09-03 NOTE — Progress Notes (Signed)
BP 120/78 (BP Location: Right Arm, Patient Position: Sitting, Cuff Size: Normal)   Pulse 71   Temp 98.7 F (37.1 C) (Oral)   Ht 5\' 3"  (1.6 m)   Wt 153 lb 8 oz (69.6 kg)   SpO2 100%   BMI 27.19 kg/m    CC: hosp f/u visit Subjective:    Patient ID: [name] Cruz, male    DOB: 07/02/1955, 62 y.o.   MRN: [id number]  HPI: [name] Cruz is a 62 y.o. male presenting on 09/03/2017 for Hospitalization Follow-up (Admitted to [facility] on 08/09/17, primary dx seizure. Pt accompanied by his mother. )   Recent complicated hospitalization for grand mal seizure with resultant Vtach cardiac arrest hospitalized in the ICU. ?alcohol related seizures. Known encephalomalacia and residual cognitive impairment after prior L periatrial hypertensive hemorrhagic stroke. Had acute LUE DVT (L brachial and basilic veins) planned treatment with apixiban rec for 3 months. MRI showed acute ischemic R parietal cortical/subcortical infarct. He was started on rocephin for ?UTI, completed 3 days of oral keflex.  Now on keppra 1000mg  bid.  Planned f/u with cardiology Dr Eldridge DaceVaranasi - appt scheduled 09/24/2017.  Neurology was not recommended.  Mom admits pt was continuing to drink alcohol prior to hospitalization (had stopped after CABG 03/2017). Now he is exclusively drinking non alcoholic beer.   HH set up but they were unable to reach patient to establish care.   Admit date: 08/09/2017 [facility]. See chart  DISCHARGE DIAGNOSES:  Seizure disorder Cardiac arrest Chronic systolic CHF  RECOMMENDATIONS FOR OUTPATIENT FOLLOW UP: 1. Close follow-up with PCP 2. Outpatient follow-up with cardiology  Relevant past medical, surgical, family and social history reviewed and updated as indicated. Interim medical history since our last visit reviewed. Allergies and medications reviewed and updated. Discharge medications reconciled with current list.  Outpatient Medications Prior  to Visit  Medication Sig Dispense Refill  . apixaban (ELIQUIS) 5 MG TABS tablet Take 1 tablet (5 mg total) by mouth 2 (two) times daily. Start taking after completing the 10mg  dose. 60 tablet 2  . aspirin EC 81 MG EC tablet Take 1 tablet (81 mg total) by mouth daily. 30 tablet 0  . atorvastatin (LIPITOR) 80 MG tablet TAKE 1 TABLET (80 MG TOTAL) BY MOUTH DAILY. FOR CHOLESTEROL (Patient taking differently: Take 80 mg by mouth daily. FOR CHOLESTEROL) 90 tablet 1  . Multiple Vitamin (MULTIVITAMIN WITH MINERALS) TABS tablet Take 1 tablet by mouth daily. 30 tablet 0  . thiamine 100 MG tablet Take 1 tablet (100 mg total) by mouth daily. 30 tablet 0  . levETIRAcetam (KEPPRA) 1000 MG tablet Take 1 tablet (1,000 mg total) by mouth 2 (two) times daily. 60 tablet 1  . ezetimibe (ZETIA) 10 MG tablet Take 1 tablet (10 mg total) by mouth daily. 90 tablet 3  . apixaban (ELIQUIS) 5 MG TABS tablet Take 2 tablets (10 mg total) by mouth 2 (two) times daily for 4 days. 16 tablet 0   No facility-administered medications prior to visit.      Per HPI unless specifically indicated in ROS section below Review of Systems     Objective:    BP 120/78 (BP Location: Right Arm, Patient Position: Sitting, Cuff Size: Normal)   Pulse 71   Temp 98.7 F (37.1 C) (Oral)   Ht 5\' 3"  (1.6 m)   Wt 153 lb 8 oz (69.6 kg)   SpO2 100%   BMI 27.19 kg/m  Wt Readings from Last 3 Encounters:  09/03/17 153 lb 8 oz (69.6 kg)  08/21/17 157 lb 13.6 oz (71.6 kg)  05/29/17 163 lb (73.9 kg)    Physical Exam  Constitutional: He appears well-developed and well-nourished. No distress.  HENT:  Head: Normocephalic and atraumatic.  Mouth/Throat: Oropharynx is clear and moist. No oropharyngeal exudate.  Eyes: Pupils are equal, round, and reactive to light. EOM are normal.  Neck: No thyromegaly present.  Cardiovascular: Normal rate, regular rhythm and normal heart sounds.  No murmur heard. Pulmonary/Chest: Effort normal and breath  sounds normal. No respiratory distress. He has no wheezes. He has no rales.  Musculoskeletal: He exhibits no edema.  No pain or cords to palpation along L upper extremity  Lymphadenopathy:    He has no cervical adenopathy.  Neurological: He is alert.  Psychiatric: He has a normal mood and affect.  Nursing note and vitals reviewed.     Assessment & Plan:   Problem List Items Addressed This Visit    RESOLVED: Status epilepticus (HCC)   Relevant Medications   levETIRAcetam (KEPPRA) 1000 MG tablet   Other Relevant Orders   Ambulatory referral to Neurology   Seizure disorder [facility]) - Primary    Initial seizure 2014 after abrupt discontinuation of alcohol. Started on phenytoin at that time. Did not f/u neurology. Took phenytoin from 2014 through 03/2017, seizure free. At that time he stopped drinking alcohol, but subesquently restarted. Likely prior CVA also increased seizure risk. Will need longterm AED - now on keppra 1000mg  bid. Given hx, will refer to neurology for evaluation.       Relevant Medications   levETIRAcetam (KEPPRA) 1000 MG tablet   Other Relevant Orders   Ambulatory referral to Neurology   Ischemic stroke [facility])    MRI showed possible ischemic stroke (6mm acute R parietal infarct). He is currently only on aspirin 81mg  daily as well as eliquis for DVT. Has h/o hemorrhagic stroke 2007. Will refer to neurology for blood thinner recommendations.       Relevant Orders   Ambulatory referral to Neurology   HTN (hypertension)    BP stable today - off antihypertensives.       HLD (hyperlipidemia)    Chronic, continue lipitor and zetia.  The ASCVD Risk score Denman George DC Jr., et al., 2013) failed to calculate for the following reasons:   The patient has a prior MI or stroke diagnosis       History of stroke with current residual effects   Relevant Orders   Ambulatory referral to Neurology   Encephalomalacia    With residual cognitive impairment - likely increased seizure  risk.      Relevant Orders   Ambulatory referral to Neurology   Coronary artery disease involving coronary bypass graft of native heart without angina pectoris    Only on aspirin 81mg  daily. Has f/u planned with cardiology next month.       Cognitive impairment   RESOLVED: Cardiac arrest [facility])   Atrial fibrillation (HCC)    H/o PAF, now off amiodarone and sounds regular.  Previously AC held due to h/o hemorrhagic stroke (due to hypertension)      Alcohol abuse    Advised to remain off alcohol. Non-alcoholic beer is ok.       Acute deep vein thrombosis (DVT) of left upper extremity (HCC)    L brachial and basilic veins - on eliquis, discussed duration of 3 months. Healing well.   Caution in h/o hemorrhagic stroke.  Meds ordered this encounter  Medications  . levETIRAcetam (KEPPRA) 1000 MG tablet    Sig: Take 1 tablet (1,000 mg total) by mouth 2 (two) times daily.    Dispense:  180 tablet    Refill:  3   Orders Placed This Encounter  Procedures  . Ambulatory referral to Neurology    Referral Priority:   Routine    Referral Type:   Consultation    Referral Reason:   Specialty Services Required    Requested Specialty:   Neurology    Number of Visits Requested:   1    Follow up plan: Return in about 5 months (around 02/03/2018) for medicare wellness visit, annual exam, prior fasting for blood work.  Eustaquio BoydenJavier Willet Schleifer, MD

## 2017-09-03 NOTE — Assessment & Plan Note (Signed)
With residual cognitive impairment - likely increased seizure risk.

## 2017-09-03 NOTE — Assessment & Plan Note (Signed)
H/o PAF, now off amiodarone and sounds regular.  Previously AC held due to h/o hemorrhagic stroke (due to hypertension)

## 2017-09-03 NOTE — Assessment & Plan Note (Signed)
Only on aspirin 81mg  daily. Has f/u planned with cardiology next month.

## 2017-09-03 NOTE — Assessment & Plan Note (Signed)
Advised to remain off alcohol. Non-alcoholic beer is ok.

## 2017-09-03 NOTE — Assessment & Plan Note (Addendum)
MRI showed possible ischemic stroke (6mm acute R parietal infarct). He is currently only on aspirin 81mg  daily as well as eliquis for DVT. Has h/o hemorrhagic stroke 2007. Will refer to neurology for blood thinner recommendations.

## 2017-09-03 NOTE — Assessment & Plan Note (Signed)
BP stable today - off antihypertensives.

## 2017-09-04 ENCOUNTER — Telehealth: Payer: Self-pay

## 2017-09-04 NOTE — Telephone Encounter (Signed)
Shirlee LimerickMarion, did you try to call this pt yesterday?

## 2017-09-04 NOTE — Telephone Encounter (Signed)
Copied from CRM [id number]. Topic: Quick Communication - Office Called Patient >> Sep 03, 2017  3:20 PM Oneal GroutSebastian, Jennifer S wrote: Reason for CRM: Returning call

## 2017-09-04 NOTE — Telephone Encounter (Signed)
Spoke with patient's mother, Altamese DillingDot Stewart, and [facility] and mother advised their office will reach out to them directly to schedule-Kele Withem V Alayzia Pavlock, RMA

## 2017-09-16 ENCOUNTER — Telehealth: Payer: Self-pay | Admitting: Neurology

## 2017-09-16 NOTE — Telephone Encounter (Signed)
Patient mom called in stating she would like her son's appointment to be on 09/24/17.[name] Cruz She would like a call back.

## 2017-09-16 NOTE — Telephone Encounter (Signed)
RN call patients mom about wanting to be seen on 09/24/2017. The mom stated she did not want to drive 25 miles to and from our office. The pt has another app appt on 09/24/2017, and mom wanted to reschedule with Dr. Pearlean Brownie on  9./11/2017.RN stated Dr. Pearlean Brownie has no more open slots on 09/24/2017. The mom stated she just did not want to drive an hour each day.RN ask pts mom if someone else can bring pt on 09/25/2017. The mom began to say "I can bring him on the 09/25/2017, I just did not want to drive back to back to appts. Appt will be the same.

## 2017-09-17 ENCOUNTER — Ambulatory Visit: Payer: Medicare Other | Admitting: Family Medicine

## 2017-09-24 ENCOUNTER — Encounter: Payer: Self-pay | Admitting: Physician Assistant

## 2017-09-24 ENCOUNTER — Ambulatory Visit: Payer: Medicare Other | Admitting: Physician Assistant

## 2017-09-24 VITALS — BP 110/80 | HR 65 | Wt 153.8 lb

## 2017-09-24 DIAGNOSIS — I480 Paroxysmal atrial fibrillation: Secondary | ICD-10-CM

## 2017-09-24 DIAGNOSIS — E782 Mixed hyperlipidemia: Secondary | ICD-10-CM

## 2017-09-24 DIAGNOSIS — I469 Cardiac arrest, cause unspecified: Secondary | ICD-10-CM

## 2017-09-24 DIAGNOSIS — I10 Essential (primary) hypertension: Secondary | ICD-10-CM

## 2017-09-24 DIAGNOSIS — I2510 Atherosclerotic heart disease of native coronary artery without angina pectoris: Secondary | ICD-10-CM

## 2017-09-24 MED ORDER — CARVEDILOL 3.125 MG PO TABS: 3.1250 mg | ORAL_TABLET | Freq: Two times a day (BID) | ORAL | 3 refills | Status: DC

## 2017-09-24 NOTE — Patient Instructions (Addendum)
Medication Instructions:  Your physician has recommended you make the following change in your medication:   START: carvedilol (coreg) 3.125 mg tablet: Take 1 tablet twice a day  Labwork: TODAY: BMET, CBC, MG  Testing/Procedures: None ordered  Follow-Up: Keep appointment scheduled with Dr. Eldridge Dace on 12/02/17 at 11:00 AM   Any Other Special Instructions Will Be Listed Below (If Applicable).     If you need a refill on your cardiac medications before your next appointment, please call your pharmacy.

## 2017-09-24 NOTE — Progress Notes (Signed)
Cardiology Office Note    Date:  09/24/2017   ID:  [name], [name] 1955/10/31, MRN [id number]  PCP:  [name], MD  Cardiologist: Lance Muss, MD EPS: None  Chief Complaint  Patient presents with  . Hospitalization Follow-up    History of Present Illness:  [name] Cruz is a 62 y.o. male with history of hemorrhagic stroke (thought to be secondary to uncontrolled hypertension now with residual cognitive impairment), hypertension, hyperlipidemia.  Patient had an STEMI 02/21/2017 when he presented to the hospital with atrial fibrillation with RVR and diffuse ST depression and elevation in aVR.  Troponins rose to 19.6.  He was found to have three-vessel CAD and underwent CABG x 3-2/11/19 with a LIMA to the LAD, reverse SVG to the distal RCA, reverse SVG to the circumflex.  Echo showed LVEF to be 45 to 50% but LV gram at time of cath was 25 to 30%.  Because of history of hemorrhagic stroke no oral anticoagulation was initiated postop.   Patient was seen in the hospital 08/2017 when admitted with status epilepticus.  He had ventricular tachycardia/cardiac arrest with PEA documented and required CPR in the CT scanner treated with amiodarone and epi.  Aspirin was restarted but could not restart beta-blocker because of blood pressure fluctuations.  EKG showed diffuse T wave inversion laterally which improved some on follow-up EKG 08/15/2017.  It was felt EKG changes can be seen with profound neurological events.  Echo 07/2017 LVEF 45 to 50% with mild diffuse hypokinesis was unchanged from earlier this year.  Troponin was mildly elevated but nondiagnostic for ACS.  Patient also developed an upper extremity DVT.  Family could not afford inpatient rehab so they elected to take him home.  Patient comes in today for f/u accompanied by his mother. He is mowing lawn and most of his neurological deficits.  Denies chest pain, palpitations, dyspnea, dyspnea on exertion, dizziness or presyncope.   Overall feeling well.  No further seizures.  No palpitations.  Past Medical History:  Diagnosis Date  . Cognitive impairment 2007   after stroke, saw rehab but told to stop because was too upsetting to him  . History of chicken pox   . HLD (hyperlipidemia)   . HTN (hypertension)   . NSTEMI (non-ST elevated myocardial infarction) (HCC) 02/21/2017  . Obesity   . Stroke, hemorrhagic (HCC) 2007   thought 2/2 HTN (240sbp); residual cognitive impairment, loss of R peripheral field, no driving    Past Surgical History:  Procedure Laterality Date  . ANKLE SURGERY  1990s   right foot with plate and screws  . CORONARY ARTERY BYPASS GRAFT N/A 02/24/2017   3v Procedure: CORONARY ARTERY BYPASS GRAFTING (CABG) x 3 ON PUMP USING LEFT INTERNAL MAMMARY ARTERY TO LEFT ANTERIOR DESENDING CORNARY ARTERY, RIGHT GREATER SAPHENOUS VEIN TO LEFT CIRCUMFLEX ARTERY AND POSTERIOR DESENDING ARTERY. RIGHT GREATER SAPHENOUS VEIN OBTAINED VIA ENDOVEIN HARVEST.;  Surgeon: Delight Ovens, MD  . IABP INSERTION N/A 02/21/2017   Procedure: IABP Insertion;  Surgeon: Yvonne Kendall, MD;  Location: [facility] INVASIVE CV LAB;  Service: Cardiovascular;  Laterality: N/A;  . LEFT HEART CATH AND CORONARY ANGIOGRAPHY N/A 02/21/2017   Procedure: LEFT HEART CATH AND CORONARY ANGIOGRAPHY;  Surgeon: Yvonne Kendall, MD;  Location: [facility] INVASIVE CV LAB;  Service: Cardiovascular;  Laterality: N/A;  . TEE WITHOUT CARDIOVERSION N/A 02/24/2017   Procedure: TRANSESOPHAGEAL ECHOCARDIOGRAM (TEE);  Surgeon: Delight Ovens, MD;  Location: [facility];  Service: Open Heart Surgery;  Laterality: N/A;  Current Medications: Current Meds  Medication Sig  . apixaban (ELIQUIS) 5 MG TABS tablet Take 1 tablet (5 mg total) by mouth 2 (two) times daily. Start taking after completing the 10mg  dose.  Marland Kitchen aspirin EC 81 MG EC tablet Take 1 tablet (81 mg total) by mouth daily.  Marland Kitchen atorvastatin (LIPITOR) 80 MG tablet TAKE 1 TABLET (80 MG TOTAL) BY MOUTH DAILY. FOR  CHOLESTEROL (Patient taking differently: Take 80 mg by mouth daily. FOR CHOLESTEROL)  . ezetimibe (ZETIA) 10 MG tablet Take 1 tablet (10 mg total) by mouth daily.  Marland Kitchen levETIRAcetam (KEPPRA) 1000 MG tablet Take 1 tablet (1,000 mg total) by mouth 2 (two) times daily.  . Multiple Vitamin (MULTIVITAMIN WITH MINERALS) TABS tablet Take 1 tablet by mouth daily.  Marland Kitchen thiamine 100 MG tablet Take 1 tablet (100 mg total) by mouth daily.     Allergies:   Patient has no known allergies.   Social History   Socioeconomic History  . Marital status: Single    Spouse name: Not on file  . Number of children: Not on file  . Years of education: Not on file  . Highest education level: Not on file  Occupational History  . Not on file  Social Needs  . Financial resource strain: Not on file  . Food insecurity:    Worry: Not on file    Inability: Not on file  . Transportation needs:    Medical: Not on file    Non-medical: Not on file  Tobacco Use  . Smoking status: Never Smoker  . Smokeless tobacco: Former Engineer, water and Sexual Activity  . Alcohol use: Yes    Alcohol/week: 14.0 standard drinks    Types: 12 Cans of beer, 2 Shots of liquor per week    Comment: regular alcohol use  . Drug use: No  . Sexual activity: Not on file  Lifestyle  . Physical activity:    Days per week: Not on file    Minutes per session: Not on file  . Stress: Not on file  Relationships  . Social connections:    Talks on phone: Not on file    Gets together: Not on file    Attends religious service: Not on file    Active member of club or organization: Not on file    Attends meetings of clubs or organizations: Not on file    Relationship status: Not on file  Other Topics Concern  . Not on file  Social History Narrative   Caffeine: 1 pot coffee   Lives with oldest sister in [place]   Occupation: disabled, was Corporate investment banker   Does not drive.   Activity: no regular exercise   Diet: good water, drinks V8 juice      Family History:  The patient's family history includes Alcohol abuse in his father; Alzheimer's disease in his maternal grandfather; Cancer in his mother.   ROS:   Please see the history of present illness.    ROS All other systems reviewed and are negative.   PHYSICAL EXAM:   VS:  BP 110/80 (BP Location: Left Arm, Patient Position: Sitting, Cuff Size: Normal)   Pulse 65   Wt 153 lb 12.8 oz (69.8 kg)   BMI 27.24 kg/m   Physical Exam  GEN: Well nourished, well developed, in no acute distress  Neck: no JVD, carotid bruits, or masses Cardiac:RRR; positive S4, 1/6 systolic murmur at the left sternal border respiratory:  clear to auscultation bilaterally, normal work  of breathing GI: soft, nontender, nondistended, + BS Ext: without cyanosis, clubbing, or edema, Good distal pulses bilaterally Neuro:  Alert and Oriented x 3 Psych: euthymic mood, full affect  Wt Readings from Last 3 Encounters:  09/24/17 153 lb 12.8 oz (69.8 kg)  09/03/17 153 lb 8 oz (69.6 kg)  08/21/17 157 lb 13.6 oz (71.6 kg)      Studies/Labs Reviewed:   EKG:  EKG is  ordered today.  The ekg ordered today demonstrates normal sinus rhythm minimal ST-T wave changes much improved from EKG in the hospital.  Recent Labs: 02/23/2017: TSH 2.054 08/11/2017: ALT 41 08/14/2017: Magnesium 2.1 08/21/2017: BUN 11; Creatinine, Ser 0.51; Hemoglobin 9.7; Platelets 383; Potassium 3.4; Sodium 141   Lipid Panel    Component Value Date/Time   CHOL 177 07/11/2017 0950   TRIG 54 08/12/2017 2225   HDL 96 07/11/2017 0950   CHOLHDL 1.8 07/11/2017 0950   CHOLHDL 3 01/28/2017 0752   VLDL 23.6 01/28/2017 0752   LDLCALC 65 07/11/2017 0950   LDLDIRECT 122.0 07/22/2016 0959    Additional studies/ records that were reviewed today include:   2D echo 08/10/2017 Study Conclusions   - Left ventricle: The cavity size was normal. There was mild focal   basal hypertrophy of the septum. Systolic function was mildly   reduced. The  estimated ejection fraction was in the range of 45%   to 50%. Mild diffuse hypokinesis. The study is not technically   sufficient to allow evaluation of LV diastolic function. - Aortic valve: Moderately to severely calcified annulus.   Trileaflet; normal thickness, moderately calcified leaflets.   There was mild regurgitation. - Pulmonary arteries: PA peak pressure: 32 mm Hg (S). - Inferior vena cava: The vessel was mildly dilated.       ASSESSMENT:    1. Coronary artery disease involving native coronary artery of native heart without angina pectoris   2. Paroxysmal atrial fibrillation (HCC)   3. Cardiac arrest (HCC)   4. Essential hypertension   5. Mixed hyperlipidemia      PLAN:  In order of problems listed above:  CAD status post STEMI 02/21/2017 found to have three-vessel CAD and underwent CABG times 3-2/11/19, LVEF at cath 25 to 30% but echo 45 to 50%.  Patient had cardiac arrest with VT while on undergoing CT scan in the setting of status epilepticus 07/2017 treated with amiodarone and epi.  Diffuse EKG changes gradually improved.  Felt they could be seen with neurological event.  Because of patient's mental status no further cardiac work-up was recommended.  Now recovered and without symptoms.  Discussed with Dr. Graciela Husbands and will start low-dose Coreg 3.125 mg twice daily.  Follow-up with Dr. Eldridge Dace in October.  Recheck labs including potassium, magnesium and CBC  PAF not on anticoagulation in the past because of prior hemorrhagic stroke but now on Eliquis managed by neurology  Essential hypertension blood pressure normal.  Patient's insurance company is going to give him a home pressure monitoring cuff  Mixed hyperlipidemia on Lipitor   Medication Adjustments/Labs and Tests Ordered: Current medicines are reviewed at length with the patient today.  Concerns regarding medicines are outlined above.  Medication changes, Labs and Tests ordered today are listed in the Patient  Instructions below. There are no Patient Instructions on file for this visit.   Signed, Jacolyn Reedy, PA-C  09/24/2017 1:10 PM    [facility] [address] [place], [place], [place]  [address] Phone: [phone]; Fax: [phone]    

## 2017-09-25 ENCOUNTER — Other Ambulatory Visit: Payer: Medicare Other | Admitting: *Deleted

## 2017-09-25 ENCOUNTER — Ambulatory Visit: Payer: Medicare Other | Admitting: Neurology

## 2017-09-25 ENCOUNTER — Encounter: Payer: Self-pay | Admitting: Neurology

## 2017-09-25 ENCOUNTER — Telehealth: Payer: Self-pay | Admitting: Interventional Cardiology

## 2017-09-25 VITALS — BP 114/72 | HR 61 | Ht 63.0 in | Wt 154.0 lb

## 2017-09-25 DIAGNOSIS — E875 Hyperkalemia: Secondary | ICD-10-CM

## 2017-09-25 DIAGNOSIS — G40011 Localization-related (focal) (partial) idiopathic epilepsy and epileptic syndromes with seizures of localized onset, intractable, with status epilepticus: Secondary | ICD-10-CM | POA: Diagnosis not present

## 2017-09-25 LAB — CBC
HEMATOCRIT: 37.9 % (ref 37.5–51.0)
HEMOGLOBIN: 12.7 g/dL — AB (ref 13.0–17.7)
MCH: 31.3 pg (ref 26.6–33.0)
MCHC: 33.5 g/dL (ref 31.5–35.7)
MCV: 93 fL (ref 79–97)
Platelets: 302 10*3/uL (ref 150–450)
RBC: 4.06 x10E6/uL — ABNORMAL LOW (ref 4.14–5.80)
RDW: 14 % (ref 12.3–15.4)
WBC: 6.7 10*3/uL (ref 3.4–10.8)

## 2017-09-25 LAB — BASIC METABOLIC PANEL
BUN / CREAT RATIO: 14 (ref 10–24)
BUN / CREAT RATIO: 16 (ref 10–24)
BUN: 11 mg/dL (ref 8–27)
BUN: 12 mg/dL (ref 8–27)
CO2: 26 mmol/L (ref 20–29)
CO2: 23 mmol/L (ref 20–29)
CREATININE: 0.78 mg/dL (ref 0.76–1.27)
CREATININE: 0.74 mg/dL — AB (ref 0.76–1.27)
Calcium: 9.9 mg/dL (ref 8.6–10.2)
Calcium: 9.7 mg/dL (ref 8.6–10.2)
Chloride: 103 mmol/L (ref 96–106)
Chloride: 103 mmol/L (ref 96–106)
GFR, EST AFRICAN AMERICAN: 112 mL/min/{1.73_m2} (ref >59)
GFR, EST AFRICAN AMERICAN: 114 mL/min/{1.73_m2} (ref >59)
GFR, EST NON AFRICAN AMERICAN: 97 mL/min/{1.73_m2} (ref >59)
GFR, EST NON AFRICAN AMERICAN: 99 mL/min/{1.73_m2} (ref >59)
Glucose: 82 mg/dL (ref 65–99)
Glucose: 90 mg/dL (ref 65–99)
Potassium: 5.5 mmol/L — ABNORMAL HIGH (ref 3.5–5.2)
Potassium: 4.8 mmol/L (ref 3.5–5.2)
Sodium: 142 mmol/L (ref 134–144)
Sodium: 141 mmol/L (ref 134–144)

## 2017-09-25 LAB — MAGNESIUM: Magnesium: 1.9 mg/dL (ref 1.6–2.3)

## 2017-09-25 MED ORDER — LEVETIRACETAM 1000 MG PO TABS: 1000.0000 mg | ORAL_TABLET | Freq: Two times a day (BID) | ORAL | 3 refills | Status: DC

## 2017-09-25 NOTE — Telephone Encounter (Signed)
Patient's mother returning call. Made her aware patient needs repeat BMET tomorrow. Mother states that she cannot bring him tomorrow, but can bring him today. Patient will have repeat BMET today. Lab ordered and appointment made.    Notes recorded by Dyann KiefLenze, Michele M, PA-C on 09/25/2017 at 11:18 AM EDT Thanks [place]. He may have trouble getting to our office as he lives an hour away and doesn't drive. Could he have it drawn near where he lives and sent to us. Hopefully it's a lab error. ------  Notes recorded by Lattie Hawurrie, Cynda Soule I, RN on 09/25/2017 at 11:04 AM EDT Patient will need repeat BMET tomorrow. Left message for patient to call back. ------  Notes recorded by Lattie Hawurrie, Adra Shepler I, RN on 09/25/2017 at 10:06 AM EDT Preliminary reviewed by nurse, awaiting MD signature Potassium elevated at 5.5 Called patient and patient is not taking a potassium supplement and is not ony any ACE-I, ARB, Entresto, or Spironolactone. Patient denies using any salt substitutes. Made patient aware that he will likely require repeat BMET, but I would review with DOD and call him back with any recommendations.

## 2017-09-25 NOTE — Patient Instructions (Signed)
I had a long discussion with the patient and his mother as well as I spoke to his sister over the phone regarding his recent hospitalization for status epilepticus. I recommend he continue Keppra 1 g twice daily which is tolerating well without any side effects. I also advised him to avoid seizure provoking factors like sleep deprivation, medication noncompliance and irregular eating or sleeping habits.She will also stay on eliquis for his deep vein thrombosis in the left upper extremity and follow-up with his primary care physician for that. I gave him refill of Keppra. He will not drive.he will maintain strict control of hypertension with blood pressure goal below 140/90. He'll return for follow-up in 3 months with nurse practitioner or call earlier if necessary.

## 2017-09-25 NOTE — Progress Notes (Signed)
[facility] [address] [place]. [place] [id number] [phone]       OFFICE CONSULT NOTE  [name] Date of Birth:  05/05/1955 Medical Record Number:  [id number]   Referring MD:   Tomasa HostellerJavier Guitierez  Reason for Referral:  seizures  HPI: [name] is [age] year Caucasian male seen today for initial office consultation visit. Is a complaint by his mother. History is obtained from them and review of electronic medical records. I have personally viewed imaging films in PACS.[name] Cruz is an 62 y.o. male  With PMH significant for Seizures, hemorrhagic CVA 2004 ( residual cognitive deficits only), alcohol abuse who [facility] via [facility] with seizures.on 08/09/17 .Per family patient was in food [employer] where he didn't feel quite right spun around twice, fell to the floor and began to have a seizure. Transported to [facility] via EMS. [facility] patient had a second seizure witnessed by staff  That lasted approximately 30 seconds where his eyes rolled to the back of his head and he began having a tonic/clonic seizure. 1 mg ativan given at this time and patient was taken to CT. In route to CT patient began having runs of V-tach. While in CT patient was post-ictal with roving eye movements, and then went into pulseless v-tach. At this time chest compressions were started. Once patient was back in room in the process of being intubated he began to have another tonic/clonic seizure. Patient was intubated without any medications. 1g of keppra was given, propofol started at 542mcg/min/kg ( unable to titrate up due to hypotension). Family states that patient does not have a history of seizures. Chart review revealed that [facility] Thad Rangereynolds in 2014 for seizures. Patient was discharged on Dilantin at that time with follow-up  appointment with  Outpatient neurology [facility]. Patient last filled phenytoin 100 mg TID prescription in march 2019 and it would have run out  in June. Questioning compliance.upon evaluation in the ED basic lab work and urinalysis were unremarkable..CT head: no intracranial abnormality, stable chronic ventriculomegaly with left temporal occipital encephalomalacia and central volume loss It was felt that the patient likely had seizures in setting of noncompliance off Dilantin and discontinuation. His old hemorrhagic stroke was felt to be the focus for his symptomatic seizures. He had at least 2 witnessed seizures in the ER possibly a third one. His cardiac monitor showed possibly ventricular tachycardia resulting in cardiac arrest. He was loaded with 1 g of Keppra with maintenance dose of 1 g twice daily.  Vimpat was subsequently added as well. EEG obtained on 08/09/17 showed frequent left frontotemporal seizures bridged by periodic lateralized discharges on the in interictal ictal continuum consistent with partial status epilepticus from the left frontotemporal region.Vimpat 200 mg every 12 hourly was added and when necessary lorazepam was also used. Patient underwent long-term EEG monitoring overnight with simultaneous video monitoring which recorded several electrographic seizures arising from the right temporal region but no obvious clinical accompaniment to these electrographic seizures.follow-up prolonged video EEG monitoring showed no further evidence of clinical or subclinical seizures.patient was intubated and Sedated with propofol and Versed. However repeat video EEG recording on 08/12/17 showed persistent clinical or subclinical seizures present throughout the recording. Dilantin was added and patient was continued on Keppra and Vimpat. Further repeat EEG showed no further seizures. His sedation was withdrawn.patient was eventually extubated. He was discharged on Keppra and Dilantin and Vimpat was discontinued. The patient  states his done well since discharge has not been taking Dilantin and said his been taking Keppra 1 g twice daily only. He is  living at home with his family. His abnormal further witnessed seizures. His back to his baseline. He still complains of some minor pain in the back of his head from the fall related to the seizure and injury. 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 He has residual right-sided homonymous hemianopsia which is persistent. He does not drive. States his blood pressure is well controlled and today it is 114/72. ROS:   14 system review of systems is positive for  Weight loss, fatigue, chest pain, leg swelling, ringing in the ears, loss of vision, shortness of breath, easy bruising and bleeding, slurred speech, seizure, decreased energy, sleepiness and all systems negative  PMH:  Past Medical History:  Diagnosis Date  . Cognitive impairment 2007   after stroke, saw rehab but told to stop because was too upsetting to him  . History of chicken pox   . HLD (hyperlipidemia)   . HTN (hypertension)   . NSTEMI (non-ST elevated myocardial infarction) (HCC) 02/21/2017  . Obesity   . Stroke, hemorrhagic (HCC) 2007   thought 2/2 HTN (240sbp); residual cognitive impairment, loss of R peripheral field, no driving    Social History:  Social History   Socioeconomic History  . Marital status: Single    Spouse name: Not on file  . Number of children: 2  . Years of education: Not on file  . Highest education level: 10th grade  Occupational History    Comment: Unemployed   Social Needs  . Financial resource strain: Not on file  . Food insecurity:    Worry: Not on file    Inability: Not on file  . Transportation needs:    Medical: Not on file    Non-medical: Not on file  Tobacco Use  . Smoking status: Never Smoker  . Smokeless tobacco: Former Engineer, water and Sexual Activity  . Alcohol use: Not Currently    Alcohol/week: 14.0 standard drinks    Types: 12 Cans of beer, 2 Shots of liquor per week  . Drug use: No  . Sexual activity:  Not on file  Lifestyle  . Physical activity:    Days per week: Not on file    Minutes per session: Not on file  . Stress: Not on file  Relationships  . Social connections:    Talks on phone: Not on file    Gets together: Not on file    Attends religious service: Not on file    Active member of club or organization: Not on file    Attends meetings of clubs or organizations: Not on file    Relationship status: Not on file  . Intimate partner violence:    Fear of current or ex partner: Not on file    Emotionally abused: Not on file    Physically abused: Not on file    Forced sexual activity: Not on file  Other Topics Concern  . Not on file  Social History Narrative   Caffeine: 1 pot coffee   Lives with oldest sister in [place]   Occupation: disabled, was Corporate investment banker   Does not drive.   Activity: no regular exercise   Diet: good water, drinks V8 juice   Right handed     Medications:   Current Outpatient Medications on File Prior to Visit  Medication Sig Dispense Refill  . apixaban (ELIQUIS) 5 MG TABS tablet Take 1 tablet (5 mg total) by mouth 2 (two) times daily. Start taking after completing the 10mg  dose. 60 tablet 2  . aspirin EC 81 MG EC tablet Take 1 tablet (81 mg total) by mouth daily. 30 tablet 0  . atorvastatin (LIPITOR) 80 MG tablet TAKE 1 TABLET (80 MG TOTAL) BY MOUTH DAILY. FOR CHOLESTEROL (Patient taking differently: Take 80 mg by mouth daily. FOR CHOLESTEROL) 90 tablet 1  . carvedilol (COREG) 3.125 MG tablet Take 1 tablet (3.125 mg total) by mouth 2 (two) times daily. 180 tablet 3  . losartan (COZAAR) 25 MG tablet Take 25 mg by mouth daily.    . Multiple Vitamin (MULTIVITAMIN WITH MINERALS) TABS tablet Take 1 tablet by mouth daily. 30 tablet 0  . thiamine 100 MG tablet Take 1 tablet (100 mg total) by mouth daily. 30 tablet 0  . ezetimibe (ZETIA) 10 MG tablet Take 1 tablet (10 mg total) by mouth daily. 90 tablet 3   No current facility-administered  medications on file prior to visit.     Allergies:  No Known Allergies  Physical Exam General: well developed, well nourished middle-age Caucasian male, seated, in no evident distress Head: head normocephalic and atraumatic.   Neck: supple with no carotid or supraclavicular bruits Cardiovascular: regular rate and rhythm, no murmurs Musculoskeletal: no deformity Skin:  no rash/petichiae prominent midline chest surgical scar from cardiac surgery Vascular:  Normal pulses all extremities  Neurologic Exam Mental Status: Awake and fully alert. Oriented to place and time. Recent and remote memoryi dminished. Recall 1/3 only. Attention span, concentration and fund of knowledge appropriate. Mood and affect appropriate.  Cranial Nerves: Fundoscopic exam reveals sharp disc margins. Pupils equal, briskly reactive to light. Extraocular movements full without nystagmus. Visual fields show dense right homonymous hemianopsial to confrontation. Hearing intact. Facial sensation intact. Face, tongue, palate moves normally and symmetrically.  Motor: Normal bulk and tone. Normal strength in all tested extremity muscles. Sensory.: intact to touch , pinprick , position and vibratory sensation.  Coordination: Rapid alternating movements normal in all extremities. Finger-to-nose and heel-to-shin performed accurately bilaterally. Gait and Station: Arises from chair without difficulty. Stance is normal. Gait demonstrates normal stride length and balance . Unable to heel, toe and tandem walk without difficulty.  Reflexes: 1+ and symmetric. Toes downgoing.       ASSESSMENT:  62 year old Caucasian male with recent hospitalization for complex partial status epilepticus with history of symptomatic epilepsy following remote hemorrhagic stroke and ethanol abuse.     PLAN: I had a long discussion with the patient and his mother as well as I spoke to his sister over the phone regarding his recent hospitalization for  status epilepticus. I recommend he continue Keppra 1 gram twice daily which is tolerating well without any side effects. I also advised him to avoid seizure provoking factors like sleep deprivation, medication noncompliance and irregular eating or sleeping habits.She will also stay on eliquis for his deep vein thrombosis in the left upper extremity and follow-up with his primary care physician for that. I gave him refill of Keppra. He will not drive.he will maintain strict control of hypertension with blood pressure goal below 140/90. Greater than 50% time during this 45 minute consultation visit was spent on counseling and coordination of care about his seizures, remote intracerebral hemorrhage and answering questions.He'll return for follow-up in 3 months with nurse practitioner or call earlier if necessary. Delia Heady, MD  [facility] [address] [place], [place] [address]  Phone [phone] Fax [phone] Note: This document was prepared with digital dictation and possible smart phrase technology. Any transcriptional errors that result from this process are unintentional.

## 2017-09-25 NOTE — Telephone Encounter (Signed)
New message   Patient's mother is returning call about test results.

## 2017-09-29 ENCOUNTER — Telehealth: Payer: Self-pay | Admitting: Family Medicine

## 2017-09-29 NOTE — Telephone Encounter (Signed)
Copied from CRM [id number]. Topic: Quick Communication - See Telephone Encounter >> Sep 29, 2017  8:41 AM Jay SchlichterWeikart, Melissa J wrote: CRM for notification. See Telephone encounter for: 09/29/17. Pt went to neurologist, and they only refilled his medicine. Mom says the neurologist didn't help him.  Mom is asking for mri of head to find out what is going on.  He says his head hurts behind his ear.   Cb is [phone]

## 2017-09-30 NOTE — Telephone Encounter (Signed)
Spoke with mother Mrs Roseanne RenoStewart - states headache at left occiput present since hospitalization, worsening. Transportation limits ability to get him in office for evaluation. She has bought aleve for him to try. Recommended against aleve, try tylenol in its place 500mg  TID with meals as needed. Recommend office evaluation if no better prior to ordering head imaging. She will call back in a few days with update after trying tylenol.

## 2017-10-01 ENCOUNTER — Emergency Department (HOSPITAL_COMMUNITY): Payer: Medicare Other

## 2017-10-01 ENCOUNTER — Encounter (HOSPITAL_COMMUNITY): Payer: Self-pay

## 2017-10-01 ENCOUNTER — Emergency Department (HOSPITAL_COMMUNITY)
Admission: EM | Admit: 2017-10-01 | Discharge: 2017-10-01 | Disposition: A | Discharge status: Home / Self Care | Payer: Medicare Other | Attending: Emergency Medicine | Admitting: Emergency Medicine

## 2017-10-01 DIAGNOSIS — R2689 Other abnormalities of gait and mobility: Secondary | ICD-10-CM | POA: Diagnosis not present

## 2017-10-01 DIAGNOSIS — Z87891 Personal history of nicotine dependence: Secondary | ICD-10-CM | POA: Diagnosis not present

## 2017-10-01 DIAGNOSIS — E875 Hyperkalemia: Secondary | ICD-10-CM | POA: Diagnosis not present

## 2017-10-01 DIAGNOSIS — Z951 Presence of aortocoronary bypass graft: Secondary | ICD-10-CM | POA: Insufficient documentation

## 2017-10-01 DIAGNOSIS — I2510 Atherosclerotic heart disease of native coronary artery without angina pectoris: Secondary | ICD-10-CM | POA: Insufficient documentation

## 2017-10-01 DIAGNOSIS — Z79899 Other long term (current) drug therapy: Secondary | ICD-10-CM | POA: Diagnosis not present

## 2017-10-01 DIAGNOSIS — R079 Chest pain, unspecified: Secondary | ICD-10-CM | POA: Diagnosis not present

## 2017-10-01 DIAGNOSIS — R51 Headache: Secondary | ICD-10-CM | POA: Diagnosis not present

## 2017-10-01 DIAGNOSIS — Y929 Unspecified place or not applicable: Secondary | ICD-10-CM | POA: Diagnosis not present

## 2017-10-01 DIAGNOSIS — G43909 Migraine, unspecified, not intractable, without status migrainosus: Secondary | ICD-10-CM | POA: Diagnosis not present

## 2017-10-01 DIAGNOSIS — Y939 Activity, unspecified: Secondary | ICD-10-CM | POA: Insufficient documentation

## 2017-10-01 DIAGNOSIS — Z7901 Long term (current) use of anticoagulants: Secondary | ICD-10-CM | POA: Insufficient documentation

## 2017-10-01 DIAGNOSIS — R519 Headache, unspecified: Secondary | ICD-10-CM

## 2017-10-01 DIAGNOSIS — I10 Essential (primary) hypertension: Secondary | ICD-10-CM | POA: Diagnosis not present

## 2017-10-01 DIAGNOSIS — Z7982 Long term (current) use of aspirin: Secondary | ICD-10-CM | POA: Insufficient documentation

## 2017-10-01 DIAGNOSIS — W19XXXD Unspecified fall, subsequent encounter: Secondary | ICD-10-CM | POA: Diagnosis not present

## 2017-10-01 DIAGNOSIS — Y999 Unspecified external cause status: Secondary | ICD-10-CM | POA: Insufficient documentation

## 2017-10-01 DIAGNOSIS — R001 Bradycardia, unspecified: Secondary | ICD-10-CM | POA: Diagnosis not present

## 2017-10-01 LAB — BASIC METABOLIC PANEL
ANION GAP: 7 (ref 5–15)
BUN: 6 mg/dL — ABNORMAL LOW (ref 8–23)
CALCIUM: 9.6 mg/dL (ref 8.9–10.3)
CHLORIDE: 107 mmol/L (ref 98–111)
CO2: 28 mmol/L (ref 22–32)
Creatinine, Ser: 0.9 mg/dL (ref 0.61–1.24)
GFR calc non Af Amer: >60 mL/min (ref >60)
Glucose, Bld: 100 mg/dL — ABNORMAL HIGH (ref 70–99)
Potassium: 6.1 mmol/L — ABNORMAL HIGH (ref 3.5–5.1)
SODIUM: 142 mmol/L (ref 135–145)

## 2017-10-01 LAB — CBC
HCT: 40.1 % (ref 39.0–52.0)
HEMOGLOBIN: 12.8 g/dL — AB (ref 13.0–17.0)
MCH: 31.2 pg (ref 26.0–34.0)
MCHC: 31.9 g/dL (ref 30.0–36.0)
MCV: 97.8 fL (ref 78.0–100.0)
Platelets: 232 10*3/uL (ref 150–400)
RBC: 4.1 MIL/uL — AB (ref 4.22–5.81)
RDW: 13 % (ref 11.5–15.5)
WBC: 6.2 10*3/uL (ref 4.0–10.5)

## 2017-10-01 LAB — POTASSIUM: Potassium: 5.5 mmol/L — ABNORMAL HIGH (ref 3.5–5.1)

## 2017-10-01 NOTE — ED Triage Notes (Signed)
Pt presents for evaluation of headache and weakness since last hospital visit. Patient has hx of seizures, is not good historian. Family states he was admitted to hospital in July and since has had difficulties. Niece states she went to the house today to drop off medications and he was more lethargic than usual.

## 2017-10-01 NOTE — ED Provider Notes (Signed)
[facility] EMERGENCY DEPARTMENT Provider Note   CSN: [id number] Arrival date & time: 10/01/17  1122     History   Chief Complaint Chief Complaint  Patient presents with  . Weakness  . Headache    HPI [name] Cruz is a 62 y.o. male with past medical history of stroke with cognitive impairment, hypertension, NSTEMI, seizure disorder, presenting to the ED with multiple complaints.  He was admitted in late July for status epilepticus with cardiac arrest; also had acute stroke while admitted.  He was discharged August 9.  His family reports he has been complaining of headache since that time, secondary to fall that occurred during admission or just prior to.  He has been feeling pain in the right side of his head with it is constant though improved with Tylenol.  They report persistent imbalance secondary to stroke, as well as chronic chest pain that is not new since his open heart surgery.  No associated nausea or vomiting, vision changes, recent seizure, or other associated symptoms.  Compliant with anticoagulant and seizure medications.  Reports he was evaluated by his neurologist this past week and had an elevated potassium level though on recheck it was normal.  Pt and family are poor historians.  The history is provided by the patient, a relative and medical records.    Past Medical History:  Diagnosis Date  . Cognitive impairment 2007   after stroke, saw rehab but told to stop because was too upsetting to him  . History of chicken pox   . HLD (hyperlipidemia)   . HTN (hypertension)   . NSTEMI (non-ST elevated myocardial infarction) (HCC) 02/21/2017  . Obesity   . Stroke, hemorrhagic (HCC) 2007   thought 2/2 HTN (240sbp); residual cognitive impairment, loss of R peripheral field, no driving    Patient Active Problem List   Diagnosis Date Noted  . Ischemic stroke (HCC) 08/23/2017  . Coronary artery disease involving coronary bypass graft of native heart  without angina pectoris   . Atrial fibrillation (HCC)   . Acute deep vein thrombosis (DVT) of left upper extremity (HCC)   . Dysphagia, post-stroke   . Cardiac arrest (HCC) 08/09/2017  . CAD (coronary artery disease), native coronary artery   . S/P CABG x 3 02/24/2017  . NSTEMI (non-ST elevated myocardial infarction) (HCC) 02/21/2017  . Encephalomalacia 10/28/2012  . Seizure disorder (HCC) 04/26/2012  . Alcohol abuse 04/26/2012  . History of stroke with current residual effects   . Cognitive impairment   . HLD (hyperlipidemia)   . HTN (hypertension)     Past Surgical History:  Procedure Laterality Date  . ANKLE SURGERY  1990s   right foot with plate and screws  . CORONARY ARTERY BYPASS GRAFT N/A 02/24/2017   3v Procedure: CORONARY ARTERY BYPASS GRAFTING (CABG) x 3 ON PUMP USING LEFT INTERNAL MAMMARY ARTERY TO LEFT ANTERIOR DESENDING CORNARY ARTERY, RIGHT GREATER SAPHENOUS VEIN TO LEFT CIRCUMFLEX ARTERY AND POSTERIOR DESENDING ARTERY. RIGHT GREATER SAPHENOUS VEIN OBTAINED VIA ENDOVEIN HARVEST.;  Surgeon: Delight Ovens, MD  . IABP INSERTION N/A 02/21/2017   Procedure: IABP Insertion;  Surgeon: Yvonne Kendall, MD;  Location: [facility] INVASIVE CV LAB;  Service: Cardiovascular;  Laterality: N/A;  . LEFT HEART CATH AND CORONARY ANGIOGRAPHY N/A 02/21/2017   Procedure: LEFT HEART CATH AND CORONARY ANGIOGRAPHY;  Surgeon: Yvonne Kendall, MD;  Location: [facility] INVASIVE CV LAB;  Service: Cardiovascular;  Laterality: N/A;  . TEE WITHOUT CARDIOVERSION N/A 02/24/2017   Procedure: TRANSESOPHAGEAL ECHOCARDIOGRAM (  TEE);  Surgeon: Delight OvensGerhardt, Edward B, MD;  [facility];  Service: Open Heart Surgery;  Laterality: N/A;        Home Medications    Prior to Admission medications   Medication Sig Start Date End Date Taking? Authorizing Provider  apixaban (ELIQUIS) 5 MG TABS tablet Take 1 tablet (5 mg total) by mouth 2 (two) times daily. Start taking after completing the 10mg  dose. 08/26/17  Yes Osvaldo ShipperKrishnan,  Gokul, MD  aspirin EC 81 MG EC tablet Take 1 tablet (81 mg total) by mouth daily. 08/22/17  Yes Osvaldo ShipperKrishnan, Gokul, MD  atorvastatin (LIPITOR) 80 MG tablet TAKE 1 TABLET (80 MG TOTAL) BY MOUTH DAILY. FOR CHOLESTEROL Patient taking differently: Take 80 mg by mouth daily.  03/20/17  Yes Sharol HarnessSimmons, Brittainy M, PA-C  carvedilol (COREG) 3.125 MG tablet Take 1 tablet (3.125 mg total) by mouth 2 (two) times daily. 09/24/17  Yes Dyann KiefLenze, Michele M, PA-C  ezetimibe (ZETIA) 10 MG tablet Take 1 tablet (10 mg total) by mouth daily. 05/16/17 10/01/17 Yes Simmons, Brittainy M, PA-C  levETIRAcetam (KEPPRA) 1000 MG tablet Take 1 tablet (1,000 mg total) by mouth 2 (two) times daily. 09/25/17  Yes Micki RileySethi, Pramod S, MD  losartan (COZAAR) 25 MG tablet Take 25 mg by mouth daily.   Yes [provider]  Multiple Vitamin (MULTIVITAMIN WITH MINERALS) TABS tablet Take 1 tablet by mouth daily. 08/22/17  Yes Osvaldo ShipperKrishnan, Gokul, MD  thiamine 100 MG tablet Take 1 tablet (100 mg total) by mouth daily. 08/22/17  Yes Osvaldo ShipperKrishnan, Gokul, MD    Family History Family History  Problem Relation Age of Onset  . Alzheimer's disease Maternal Grandfather   . Cancer Mother        lymphoma  . Alcohol abuse Father        smoker  . Coronary artery disease Neg Hx   . Stroke Neg Hx   . Diabetes Neg Hx     Social History Social History   Tobacco Use  . Smoking status: Never Smoker  . Smokeless tobacco: Former Engineer, waterUser  Substance Use Topics  . Alcohol use: Not Currently    Alcohol/week: 14.0 standard drinks    Types: 12 Cans of beer, 2 Shots of liquor per week  . Drug use: No     Allergies   Patient has no known allergies.   Review of Systems Review of Systems  Constitutional: Negative for fever.  Eyes: Negative for visual disturbance.  Respiratory: Negative for shortness of breath.   Cardiovascular: Positive for chest pain.  Gastrointestinal: Negative for abdominal pain, nausea and vomiting.  Neurological: Positive for headaches.  Negative for facial asymmetry.  All other systems reviewed and are negative.    Physical Exam Updated Vital Signs BP 130/71   Pulse (!) 54   Temp 97.9 F (36.6 C) (Oral)   Resp 15   Ht 5\' 3"  (1.6 m)   Wt 69.4 kg   SpO2 100%   BMI 27.10 kg/m   Physical Exam  Constitutional: He appears well-developed and well-nourished. He does not appear ill.  HENT:  Head: Normocephalic and atraumatic.  Eyes: Pupils are equal, round, and reactive to light. Conjunctivae and EOM are normal.  Neck: Normal range of motion. Neck supple.  Cardiovascular: Normal rate, regular rhythm and intact distal pulses.  Murmur heard. Pulmonary/Chest: Effort normal and breath sounds normal. No respiratory distress.  Abdominal: Soft. Bowel sounds are normal. He exhibits no distension. There is no tenderness. There is no rebound and no guarding.  Neurological: He is alert.   Oriented to person and place though when questioned about date he states it is the "14th of the month 2nd and 24th."  He is not oriented to time.  Unable to follow simple commands though no gross neuro deficits.  Skin: Skin is warm.  Psychiatric: He has a normal mood and affect. His behavior is normal.  Nursing note and vitals reviewed.    ED Treatments / Results  Labs (all labs ordered are listed, but only abnormal results are displayed) Labs Reviewed  BASIC METABOLIC PANEL - Abnormal; Notable for the following components:      Result Value   Potassium 6.1 (*)    Glucose, Bld 100 (*)    BUN 6 (*)    All other components within normal limits  CBC - Abnormal; Notable for the following components:   RBC 4.10 (*)    Hemoglobin 12.8 (*)    All other components within normal limits  POTASSIUM - Abnormal; Notable for the following components:   Potassium 5.5 (*)    All other components within normal limits    EKG EKG Interpretation  Date/Time:  Wednesday October 01 2017 11:29:23 EDT Ventricular Rate:  58 PR Interval:  142 QRS  Duration: 68 QT Interval:  402 QTC Calculation: 394 R Axis:   -14 Text Interpretation:  Sinus bradycardia Low voltage QRS Cannot rule out Anterior infarct , age undetermined Abnormal ECG Confirmed by Benjiman Core [phone]) on 10/01/2017 1:35:12 PM   Radiology Ct Head Wo Contrast  Result Date: 10/01/2017 CLINICAL DATA:  Lethargy with right parietal region headache EXAM: CT HEAD WITHOUT CONTRAST TECHNIQUE: Contiguous axial images were obtained from the base of the skull through the vertex without intravenous contrast. COMPARISON:  Head CT August 09, 2017 and brain MRI August 14, 2017 FINDINGS: Brain: There is moderate generalized ventricular enlargement, stable. There is somewhat milder sulcal prominence diffusely, stable. There is ex vacuo dilatation of the atrium of the left lateral ventricle with encephalomalacia throughout portions of the inferior left parietal and much of the left occipital lobe. There is dystrophic calcification in this area, stable. There is no mass, hemorrhage, extra-axial fluid collection, or midline shift. There is patchy small vessel disease in the centra semiovale bilaterally, stable. No acute infarct is appreciable. Vascular: No hyperdense vessel. There is calcification in each distal vertebral artery and carotid siphon region. Skull: Bony calvarium appears intact. Sinuses/Orbits: There is mucosal thickening in several ethmoid air cells bilaterally. Other visualized paranasal sinuses are clear. Orbits appear symmetric bilaterally. Other: There is opacification in multiple mastoid air cells bilaterally. There is debris in each external auditory canal, more on the right than on the left. IMPRESSION: Atrophy with ventricles somewhat more prominent in proportion than sulci, stable. Stable ex vacuo dilatation of the atrium of the left lateral ventricle with encephalomalacia in this region involving portions of the left occipital and inferior parietal lobe. There is dystrophic  calcification in this area, stable. There is patchy periventricular small vessel disease, stable. No evident acute infarct. No mass or hemorrhage. There are foci of arterial vascular calcification. There is mucosal thickening in several ethmoid air cells as well as patchy mastoid disease bilaterally. There is probable cerumen in each external auditory canal, more pronounced on the right than on the left. Electronically Signed   By: Bretta Bang III M.D.   On: 10/01/2017 14:55    Procedures Procedures (including critical care time)  Medications Ordered in ED Medications - No data to  display   Initial Impression / Assessment and Plan / ED Course  I have reviewed the triage vital signs and the nursing notes.  Pertinent labs & imaging results that were available during my care of the patient were reviewed by me and considered in my medical decision making (see chart for details).     Patient presenting with multiple complaints after recent admission in July.  Main complaint is right sided headache that is relieved with Tylenol.  Family denies any new neuro deficits or changes in behavior.  On exam, patient is oriented to person and place though is unable to orient to time.  Unable to follow simple commands which appears to be at his baseline, per family.  Initial BMP showing potassium of 6.1 though on recheck potassium is 5.5.  CT head is negative for acute pathology.  EKG unchanged.  Vital signs stable.  Patient is well-appearing throughout ED visit and without complaint.  Patient discussed with Dr. Rubin Payor, who guided treatment and agrees with care plan for discharge.  Patient recommended to follow-up with his PCP for potassium recheck and will be discharged with his daughter, who is his power of attorney.  Discussed results, findings, treatment and follow up. Patient advised of return precautions. Patient verbalized understanding and agreed with plan.  Final Clinical Impressions(s) / ED  Diagnoses   Final diagnoses:  Hyperkalemia  Acute nonintractable headache, unspecified headache type    ED Discharge Orders    None       Robinson, [place] N, PA-C 10/01/17 Natividad Brood, MD 10/01/17 2230

## 2017-10-01 NOTE — Discharge Instructions (Addendum)
The CT scan of your head showed no new changes today.   You can continue treating your headache with Tylenol as needed. Schedule an appointment with your primary care provider to recheck your potassium level within 3 days.

## 2017-10-01 NOTE — ED Notes (Signed)
Patient transported to CT 

## 2017-10-01 NOTE — ED Notes (Signed)
Discharge instructions discussed with Pt. Pt verbalized understanding. Pt stable and ambulatory.    

## 2017-10-03 ENCOUNTER — Encounter: Payer: Self-pay | Admitting: Family Medicine

## 2017-10-03 ENCOUNTER — Ambulatory Visit (INDEPENDENT_AMBULATORY_CARE_PROVIDER_SITE_OTHER): Payer: Medicare Other | Admitting: Family Medicine

## 2017-10-03 ENCOUNTER — Telehealth: Payer: Self-pay | Admitting: Family Medicine

## 2017-10-03 VITALS — BP 120/82 | HR 72 | Temp 98.4°F | Ht 63.0 in | Wt 159.2 lb

## 2017-10-03 DIAGNOSIS — Z23 Encounter for immunization: Secondary | ICD-10-CM

## 2017-10-03 DIAGNOSIS — R51 Headache: Secondary | ICD-10-CM | POA: Diagnosis not present

## 2017-10-03 DIAGNOSIS — R519 Headache, unspecified: Secondary | ICD-10-CM | POA: Insufficient documentation

## 2017-10-03 DIAGNOSIS — I10 Essential (primary) hypertension: Secondary | ICD-10-CM

## 2017-10-03 DIAGNOSIS — G40909 Epilepsy, unspecified, not intractable, without status epilepticus: Secondary | ICD-10-CM

## 2017-10-03 DIAGNOSIS — R4189 Other symptoms and signs involving cognitive functions and awareness: Secondary | ICD-10-CM

## 2017-10-03 DIAGNOSIS — E875 Hyperkalemia: Secondary | ICD-10-CM

## 2017-10-03 DIAGNOSIS — G9389 Other specified disorders of brain: Secondary | ICD-10-CM

## 2017-10-03 LAB — BASIC METABOLIC PANEL
BUN: 14 mg/dL (ref 6–23)
CHLORIDE: 103 meq/L (ref 96–112)
CO2: 30 mEq/L (ref 19–32)
Calcium: 9.6 mg/dL (ref 8.4–10.5)
Creatinine, Ser: 0.93 mg/dL (ref 0.40–1.50)
GFR: 87.39 mL/min (ref >60.00)
GLUCOSE: 91 mg/dL (ref 70–99)
POTASSIUM: 5.8 meq/L — AB (ref 3.5–5.1)
SODIUM: 140 meq/L (ref 135–145)

## 2017-10-03 MED ORDER — B-12 1000 MCG SL SUBL: 1.0000 | SUBLINGUAL_TABLET | Freq: Every day | SUBLINGUAL | 11 refills | Status: DC

## 2017-10-03 NOTE — Assessment & Plan Note (Signed)
Continue keppra 1000mg  bid.

## 2017-10-03 NOTE — Telephone Encounter (Signed)
Attempted to contact pt/family. No answer. No vm.  Need to relay Dr. Timoteo ExposeG's message.

## 2017-10-03 NOTE — Assessment & Plan Note (Addendum)
Benign exam today, FROM at neck. Reassuring CT - there was some sinus disease but his symptoms are not consistent with sinusitis. This is same area he hit his head during fall/seizure last month. Anticipate persistent headache from resolving trauma.

## 2017-10-03 NOTE — Assessment & Plan Note (Deleted)
Needs this for 3 months total - started 08/22/2017

## 2017-10-03 NOTE — Telephone Encounter (Signed)
Copied from CRM [id number]. Topic: Quick Communication - See Telephone Encounter >> Oct 03, 2017  1:41 PM Windy KalataMichael, Kameo Bains L, NT wrote: patient is family is calling and would like to know if the patient is supposed to be taking Vitamin B. She states there has not been a refill on the prescription.

## 2017-10-03 NOTE — Telephone Encounter (Addendum)
He was taking b1 vitamin. Ok to stop this. I do want him to take b12 vitamin - this is over the counter. I have sent to pharmacy in case cheaper but if not they should buy OTC. thanks

## 2017-10-03 NOTE — Patient Instructions (Addendum)
Flu shot today Cut losartan pill in half (12.5mg  daily).  Recheck labs today - we will be in touch with results and plan.  Right ear wax removed today.

## 2017-10-03 NOTE — Progress Notes (Signed)
BP 120/82 (BP Location: Left Arm, Patient Position: Sitting, Cuff Size: Normal)   Pulse 72   Temp 98.4 F (36.9 C) (Oral)   Ht 5\' 3"  (1.6 m)   Wt 159 lb 4 oz (72.2 kg)   SpO2 100%   BMI 28.21 kg/m    CC: ER f/u visit Subjective:    Patient ID: [name] MileBilly J Pritts, male    DOB: 26-Jan-1955, 62 y.o.   MRN: [id number]  HPI: [name] Cruz is a 62 y.o. male presenting on 10/03/2017 for Hospitalization Follow-up (Here for ED visit f/u. Pt provided copy of visit summary. Pt is accompanied by his mother and niece. )   R occipital headache present since prior hospitalization, presumed due to fall after seizure. It is well controlled with tylenol. Also using medicated ointment with benefit. Denies facial pain, pressure, congestion.  CT head was overall reassuring and non acute.  Found to have elevated potassium to 6.1, on recheck 5.5. He has been taking losartan 25mg  regularly for several months. He does enjoy pinto beans.   To get new BP cuff through insurance that will monitor blood pressures more closely and send hs providers updates  Relevant past medical, surgical, family and social history reviewed and updated as indicated. Interim medical history since our last visit reviewed. Allergies and medications reviewed and updated. Outpatient Medications Prior to Visit  Medication Sig Dispense Refill  . apixaban (ELIQUIS) 5 MG TABS tablet Take 1 tablet (5 mg total) by mouth 2 (two) times daily. Start taking after completing the 10mg  dose. 60 tablet 2  . aspirin EC 81 MG EC tablet Take 1 tablet (81 mg total) by mouth daily. 30 tablet 0  . atorvastatin (LIPITOR) 80 MG tablet TAKE 1 TABLET (80 MG TOTAL) BY MOUTH DAILY. FOR CHOLESTEROL (Patient taking differently: Take 80 mg by mouth daily. ) 90 tablet 1  . carvedilol (COREG) 3.125 MG tablet Take 1 tablet (3.125 mg total) by mouth 2 (two) times daily. 180 tablet 3  . levETIRAcetam (KEPPRA) 1000 MG tablet Take 1 tablet (1,000 mg total) by mouth 2 (two)  times daily. 180 tablet 3  . losartan (COZAAR) 25 MG tablet Take 0.5 tablets (12.5 mg total) by mouth daily.    . Multiple Vitamin (MULTIVITAMIN WITH MINERALS) TABS tablet Take 1 tablet by mouth daily. 30 tablet 0  . thiamine 100 MG tablet Take 1 tablet (100 mg total) by mouth daily. 30 tablet 0  . losartan (COZAAR) 25 MG tablet Take 25 mg by mouth daily.    Marland Kitchen. ezetimibe (ZETIA) 10 MG tablet Take 1 tablet (10 mg total) by mouth daily. 90 tablet 3   No facility-administered medications prior to visit.      Per HPI unless specifically indicated in ROS section below Review of Systems     Objective:    BP 120/82 (BP Location: Left Arm, Patient Position: Sitting, Cuff Size: Normal)   Pulse 72   Temp 98.4 F (36.9 C) (Oral)   Ht 5\' 3"  (1.6 m)   Wt 159 lb 4 oz (72.2 kg)   SpO2 100%   BMI 28.21 kg/m   Wt Readings from Last 3 Encounters:  10/03/17 159 lb 4 oz (72.2 kg)  10/01/17 153 lb (69.4 kg)  09/25/17 154 lb (69.9 kg)    Physical Exam  Constitutional: He appears well-developed and well-nourished. No distress.  HENT:  Right Ear: Hearing, tympanic membrane and external ear normal.  Left Ear: Hearing, tympanic membrane, external ear and ear  canal normal.  Mouth/Throat: Oropharynx is clear and moist. No oropharyngeal exudate.  R cerumen impaction, removed with plastic curette No occipital tenderness  Eyes: Pupils are equal, round, and reactive to light. EOM are normal.  Neck: Normal range of motion. Neck supple.  FROM at neck No paracervical mm tenderness No midline cervical tenderness or trap belly tenderness  Cardiovascular: Normal rate, regular rhythm and normal heart sounds.  No murmur heard. Pulmonary/Chest: Effort normal and breath sounds normal. No respiratory distress. He has no wheezes. He has no rales.  Musculoskeletal: He exhibits no edema.  Skin: Skin is warm and dry. No rash noted.  Nursing note and vitals reviewed.  Results for orders placed or performed during  the hospital encounter of 10/01/17  Basic metabolic panel  Result Value Ref Range   Sodium 142 135 - 145 mmol/L   Potassium 6.1 (H) 3.5 - 5.1 mmol/L   Chloride 107 98 - 111 mmol/L   CO2 28 22 - 32 mmol/L   Glucose, Bld 100 (H) 70 - 99 mg/dL   BUN 6 (L) 8 - 23 mg/dL   Creatinine, Ser 7.62 0.61 - 1.24 mg/dL   Calcium 9.6 8.9 - 83.1 mg/dL   GFR calc non Af Amer >60 >60 mL/min   GFR calc Af Amer >60 >60 mL/min   Anion gap 7 5 - 15  CBC  Result Value Ref Range   WBC 6.2 4.0 - 10.5 K/uL   RBC 4.10 (L) 4.22 - 5.81 MIL/uL   Hemoglobin 12.8 (L) 13.0 - 17.0 g/dL   HCT 51.7 61.6 - 07.3 %   MCV 97.8 78.0 - 100.0 fL   MCH 31.2 26.0 - 34.0 pg   MCHC 31.9 30.0 - 36.0 g/dL   RDW 71.0 62.6 - 94.8 %   Platelets 232 150 - 400 K/uL  Potassium  Result Value Ref Range   Potassium 5.5 (H) 3.5 - 5.1 mmol/L      Assessment & Plan:   Problem List Items Addressed This Visit    Unilateral occipital headache    Benign exam today, FROM at neck. Reassuring CT - there was some sinus disease but his symptoms are not consistent with sinusitis. This is same area he hit his head during fall/seizure last month. Anticipate persistent headache from resolving trauma.       Seizure disorder (HCC)    Continue keppra 1000mg  bid.       Hyperkalemia - Primary    New - only med that could be affecting this would be losartan - this was started earlier this year. Discussed potassium in diet. Will decrease losartan to 1/2 tablet daily (12.5mg ). Update K today, if remaining elevated will touch base with cardiology regarding ARB.       Relevant Orders   Basic metabolic panel   HTN (hypertension)    Chronic, stable on carvedilol 3.125mg  bid and losartan 25mg  daily. Will decrease losartan to 1/2 tablet to improve hyperkalemia - if K persistently elevated today, consider change in regimen.       Relevant Medications   losartan (COZAAR) 25 MG tablet   Encephalomalacia   Cognitive impairment    Other Visit Diagnoses     Need for influenza vaccination       Relevant Orders   Flu Vaccine QUAD 36+ mos IM (Completed)       No orders of the defined types were placed in this encounter.  Orders Placed This Encounter  Procedures  . Flu Vaccine QUAD 36+ mos  IM  . Basic metabolic panel    Follow up plan: Return if symptoms worsen or fail to improve.  [name], MD

## 2017-10-03 NOTE — Assessment & Plan Note (Signed)
New - only med that could be affecting this would be losartan - this was started earlier this year. Discussed potassium in diet. Will decrease losartan to 1/2 tablet daily (12.5mg ). Update K today, if remaining elevated will touch base with cardiology regarding ARB.

## 2017-10-03 NOTE — Assessment & Plan Note (Addendum)
Chronic, stable on carvedilol 3.125mg  bid and losartan 25mg  daily. Will decrease losartan to 1/2 tablet to improve hyperkalemia - if K persistently elevated today, consider change in regimen.

## 2017-10-04 ENCOUNTER — Other Ambulatory Visit: Payer: Self-pay | Admitting: Family Medicine

## 2017-10-07 ENCOUNTER — Telehealth: Payer: Self-pay

## 2017-10-07 NOTE — Telephone Encounter (Signed)
Copied from CRM [id number]. Topic: General - Other >> Oct 07, 2017 11:20 AM Tamela OddiHarris, Brenda J wrote: Reason for CRM: Patient called to speak with the doctor or nurse regarding the Losartan BP medication.  There has been a refill and patient is concerned.  Please advise.  CB# [phone]

## 2017-10-07 NOTE — Telephone Encounter (Signed)
Spoke with pt's mother, Dot (on dpr) relaying Dr. Timoteo ExposeG's instructions and message.  Verbalizes understanding.

## 2017-10-07 NOTE — Telephone Encounter (Signed)
See 10/03/17 lab result note.

## 2017-10-07 NOTE — Telephone Encounter (Signed)
Spoke with pt's mother, Dot (on dpr), relaying instructions concerning losartan and message for BP per Dr. Glory RosebushG. [See labs, 10/03/17.]  Verbalizes understanding.

## 2017-10-17 ENCOUNTER — Telehealth: Payer: Self-pay

## 2017-10-17 MED ORDER — SERTRALINE HCL 25 MG PO TABS: 25.0000 mg | ORAL_TABLET | Freq: Every day | ORAL | 6 refills | Status: DC

## 2017-10-17 NOTE — Telephone Encounter (Signed)
We can try low dose sertraline 25mg  daily. This medicine will need to be taken daily and can take up to 3-4 wks to take effect. This will help with depression and anxiety. Let us know if any questions or concerns.

## 2017-10-17 NOTE — Telephone Encounter (Signed)
Spoke with pt's mom, Dot (on dpr), relaying Dr. Timoteo Expose message and instructions.  Verbalizes understanding and expresses her thanks.

## 2017-10-17 NOTE — Telephone Encounter (Signed)
Copied from CRM [id number]. Topic: Quick Communication - See Telephone Encounter >> Oct 17, 2017  9:17 AM Waymon Amato wrote: Pt mom is wanting to know if the provider will give  the patient a medication for his anxiousness and depression  (mom :Dot Roseanne Reno)  [facility]    Best number [phone]

## 2017-10-17 NOTE — Telephone Encounter (Signed)
Pt last seen for HFU on 10/03/17.Please advise.

## 2017-10-19 ENCOUNTER — Other Ambulatory Visit: Payer: Self-pay | Admitting: Cardiology

## 2017-10-20 ENCOUNTER — Other Ambulatory Visit: Payer: Self-pay | Admitting: Cardiology

## 2017-10-20 NOTE — Telephone Encounter (Signed)
Dr. Eldridge Dace primary

## 2017-10-23 ENCOUNTER — Telehealth: Payer: Self-pay | Admitting: Interventional Cardiology

## 2017-10-23 ENCOUNTER — Other Ambulatory Visit: Payer: Self-pay

## 2017-10-23 NOTE — Patient Outreach (Signed)
[facility]) Care Management  10/23/2017  [name] Cruz 1955/07/08 [id number]   Medication Adherence call to [name] Cruz left a message for patient to call back patient is due on Atorvastatin 80 mg and Losartan 25 mg under [employer].   Lillia Abed CPhT Pharmacy Technician [facility] Management Direct Dial [phone]  Fax [phone] [email or web address]

## 2017-10-23 NOTE — Patient Outreach (Signed)
[facility]) Care Management  10/23/2017  [name] Cruz July 14, 1955 [id number]   Medication Adherence call back from Mr. [name] spoke with patient's daughter he is no longer taking Losartan 25 mg at this time. doctor took him off because  Medication was on back order and pharmacy did not have it in stock.    Lillia Abed CPhT Pharmacy Technician [facility] Care Management Direct Dial [phone]  Fax [phone] [name]

## 2017-10-23 NOTE — Telephone Encounter (Signed)
Left message for Anna to callback. °

## 2017-10-23 NOTE — Telephone Encounter (Signed)
New Message  Tobi Bastos at [facility] is calling because she is trying to get [facility] medication assistance but states we denied the pt's Losartan and wants to know the reason. Please call

## 2017-10-28 NOTE — Telephone Encounter (Signed)
Received refill request for losartan 25 mg qd from [facility] [address] however this is not listed as a current medication for the patient. Please advise. Thanks, MI

## 2017-10-28 NOTE — Telephone Encounter (Signed)
Do not refill this medicine. Patient's PCP [name] d/c'd the losartan on 9/21 in response to patient's BMET result on 9/20 that showed hyperkalemia. Patient should not be taking this med.   Left detailed message on [name] Cruz's VM at [facility] letting her know the above information.   Will forward back to refill department to make aware.

## 2017-11-04 ENCOUNTER — Telehealth: Payer: Self-pay

## 2017-11-04 NOTE — Telephone Encounter (Signed)
Copied from CRM [id number]. Topic: General - Other >> Nov 04, 2017  1:08 PM Gaynelle Adu wrote: Reason for CRM: Patient mother is calling to state the patient is not eaten today, and he is sleeping all day. She is not sure what to do, she is requesting a call back. Please advise

## 2017-11-04 NOTE — Telephone Encounter (Signed)
I spoke with pts mom (DPR signed); Mrs Roseanne Reno said pt has just started not eating, does not want to take meds at right time.pt has been taking sertraline 25 mg every morning. Pt has not taken any of his meds today. Pt told his sister that he will eat and take his medicines when he wants to. Pt is awake and talking normally; pt has gotten up and walked to restroom with no problem. Has made no complaints of pain. pts mom wonders if sertraline is too strong for pt. Pt mom request cb after Dr [name] reviews note. [facility] [address]

## 2017-11-04 NOTE — Telephone Encounter (Signed)
Spoke with pt's mom, Dot (on dpr), relaying Dr. Timoteo Expose message.  Verbalizes understanding and says she is not sure about his BP.   But they will make sure and check it.

## 2017-11-04 NOTE — Telephone Encounter (Signed)
Sertraline could decrease appetite. It was started for possible depression/anxiety trouble. Ok to try off this medicine. Update [place] with effect.  How have blood pressures been running?

## 2017-11-05 ENCOUNTER — Other Ambulatory Visit: Payer: Self-pay

## 2017-11-05 MED ORDER — ATORVASTATIN CALCIUM 80 MG PO TABS: ORAL_TABLET | ORAL | 1 refills | Status: DC

## 2017-11-05 MED ORDER — EZETIMIBE 10 MG PO TABS: 10.0000 mg | ORAL_TABLET | Freq: Every day | ORAL | 2 refills | Status: DC

## 2017-11-05 NOTE — Telephone Encounter (Signed)
Spoke with pt's mother, Dot (on dpr), discussing meds in question.  It looks like the Zetia and Lipitor are prescribed by cardiology.  Says she will contact them to see about chol meds. Expresses her thanks for the call back.

## 2017-11-05 NOTE — Telephone Encounter (Signed)
Pt's mother (Dot) states that pt was given the wrong medication from the pharmacy and she would like to confirm what medication(s) he is supposed to be taking.  Please call pt: [phone]

## 2017-11-05 NOTE — Telephone Encounter (Signed)
Patient's mother called and wants to know is he suppose to be on two cholesterol medications? Please advise.  They are ezetimibe (ZETIA) 10 MG tablet(Expired) &   atorvastatin (LIPITOR) 80 MG tablet   Please contact, Dot @ [phone]

## 2017-11-09 ENCOUNTER — Other Ambulatory Visit: Payer: Self-pay | Admitting: Family Medicine

## 2017-11-10 ENCOUNTER — Encounter: Payer: Self-pay | Admitting: Interventional Cardiology

## 2017-11-14 ENCOUNTER — Telehealth: Payer: Self-pay

## 2017-11-14 MED ORDER — SERTRALINE HCL 25 MG PO TABS: 12.5000 mg | ORAL_TABLET | Freq: Every day | ORAL | Status: DC

## 2017-11-14 NOTE — Telephone Encounter (Signed)
No answer, left detailed message on voicemail and offered to relay message personally by phone with a return call if needed.

## 2017-11-14 NOTE — Telephone Encounter (Signed)
Spoke with [name] who is not sure about any new medications.  She says he is on two cholesterol medications but she says they apparently started those in the hospital a few months ago.  She doesn't think that ETOH is involved but apparently not real sure.  Ms [name] Cruz advised to restart 1/2 tab of Sertraline a day and plan an OV with PCP next week or ER if sx worsen in the meantime.

## 2017-11-14 NOTE — Telephone Encounter (Signed)
Okay to restart 1/2 tab of sertraline a day but more importantly encourage eval at OV with ER cautions re: confusion, agitation,etc.  Verify no other new meds/substances that would contribute to sx (etoh, etc). Thanks.

## 2017-11-14 NOTE — Telephone Encounter (Signed)
Mrs [name] Cruz pts mom (DPR signed). Mrs [name] Cruz said that pt is confused, agitated, and somewhat depressed since stopping the zoloft on 11/04/17. NO difference in pts appetite since stopping the zoloft. Pt is not sleeping at night since stopping the zoloft. Mrs [name] Cruz wants to know if could take 1/2 tab of zoloft to help pt sleep at night and not sleep all day? Mrs [name] Cruz said she needs to know now due to going to pt's home in [place]. Dr [name] out of office will ck with Dr Para March. Please see 11/04/17 phone note.

## 2017-11-23 NOTE — Progress Notes (Signed)
BP 118/64 (BP Location: Left Arm, Patient Position: Sitting, Cuff Size: Normal)   Pulse 60   Temp 98.7 F (37.1 C) (Oral)   Ht 5\' 3"  (1.6 m)   Wt 152 lb 12 oz (69.3 kg)   SpO2 96%   BMI 27.06 kg/m    CC: 1 mo f/u visit Subjective:    Patient ID: [name] Cruz, male    DOB: 07-02-1955, 62 y.o.   MRN: [id number]  HPI: [name] Cruz is a 62 y.o. male presenting on 11/24/2017 for Follow-up (Pt accompanied by his mother, Dot. States the 1/2 tablet of Zoloft is helping. )   See prior note for details.  Feeling better since sertraline 1/2 tab was restarted at night time.  AM dosing may have caused upset stomach.  Upcoming appt with cardiology next week. They canceled neurology appt - encouraged they reschedule for blood thinner recommendations.   Relevant past medical, surgical, family and social history reviewed and updated as indicated. Interim medical history since our last visit reviewed. Allergies and medications reviewed and updated. Outpatient Medications Prior to Visit  Medication Sig Dispense Refill  . apixaban (ELIQUIS) 5 MG TABS tablet Take 1 tablet (5 mg total) by mouth 2 (two) times daily. Started 08/22/2017 60 tablet 0  . aspirin EC 81 MG EC tablet Take 1 tablet (81 mg total) by mouth daily. 30 tablet 0  . atorvastatin (LIPITOR) 80 MG tablet TAKE 1 TABLET (80 MG TOTAL) BY MOUTH DAILY. FOR CHOLESTEROL 90 tablet 1  . carvedilol (COREG) 3.125 MG tablet Take 1 tablet (3.125 mg total) by mouth 2 (two) times daily. 180 tablet 3  . Cyanocobalamin (B-12) 1000 MCG SUBL Place 1 tablet under the tongue daily. 30 each 11  . ezetimibe (ZETIA) 10 MG tablet Take 1 tablet (10 mg total) by mouth daily. 90 tablet 2  . levETIRAcetam (KEPPRA) 1000 MG tablet Take 1 tablet (1,000 mg total) by mouth 2 (two) times daily. 180 tablet 3  . Multiple Vitamin (MULTIVITAMIN WITH MINERALS) TABS tablet Take 1 tablet by mouth daily. 30 tablet 0  . thiamine 100 MG tablet Take 1 tablet (100 mg total) by  mouth daily. 30 tablet 0  . apixaban (ELIQUIS) 5 MG TABS tablet Take 1 tablet (5 mg total) by mouth 2 (two) times daily. Start taking after completing the 10mg  dose. 60 tablet 2  . sertraline (ZOLOFT) 25 MG tablet Take 0.5 tablets (12.5 mg total) by mouth daily.     No facility-administered medications prior to visit.      Per HPI unless specifically indicated in ROS section below Review of Systems     Objective:    BP 118/64 (BP Location: Left Arm, Patient Position: Sitting, Cuff Size: Normal)   Pulse 60   Temp 98.7 F (37.1 C) (Oral)   Ht 5\' 3"  (1.6 m)   Wt 152 lb 12 oz (69.3 kg)   SpO2 96%   BMI 27.06 kg/m   Wt Readings from Last 3 Encounters:  11/24/17 152 lb 12 oz (69.3 kg)  10/03/17 159 lb 4 oz (72.2 kg)  10/01/17 153 lb (69.4 kg)    Physical Exam  Constitutional: He appears well-developed and well-nourished. No distress.  HENT:  Mouth/Throat: Oropharynx is clear and moist. No oropharyngeal exudate.  Eyes: Pupils are equal, round, and reactive to light.  Cardiovascular: Normal rate, regular rhythm and normal heart sounds.  No murmur heard. Pulmonary/Chest: Effort normal and breath sounds normal. No respiratory distress. He has no  wheezes. He has no rales.  Musculoskeletal: He exhibits no edema.  Skin: Skin is warm and dry. No rash noted.  Psychiatric: He has a normal mood and affect.  Nursing note and vitals reviewed.  Results for orders placed or performed in visit on 10/03/17  Basic metabolic panel  Result Value Ref Range   Sodium 140 135 - 145 mEq/L   Potassium 5.8 (H) 3.5 - 5.1 mEq/L   Chloride 103 96 - 112 mEq/L   CO2 30 19 - 32 mEq/L   Glucose, Bld 91 70 - 99 mg/dL   BUN 14 6 - 23 mg/dL   Creatinine, Ser 1.61 0.40 - 1.50 mg/dL   Calcium 9.6 8.4 - 09.6 mg/dL   GFR 04.54 >09.81 mL/min      Assessment & Plan:   Problem List Items Addressed This Visit    Ischemic stroke [facility]) - Primary    Unclear anticoagulant recommendations post eliquis for acute  DVT suffered 08/2017. He is s/p 3v CABG early 2019, h/o hemorrhagic stroke 2007, h/o ischemic stroke late 2019. I recommended return to neurology for their recommendations after he finishes 3 months of eliquis treatment. Currently would only be on aspirin 81mg .       Relevant Medications   apixaban (ELIQUIS) 5 MG TABS tablet   HTN (hypertension)    Chronic stable only on carvedilol 3.125mg  bid. Losartan stopped due to hyperkalemia.       Relevant Medications   apixaban (ELIQUIS) 5 MG TABS tablet   Cognitive impairment    Mood seems to be doing better since restarting sertraline at night time - continue 1/2 tablet.       Acute deep vein thrombosis (DVT) of left upper extremity (HCC)    To complete 3 months of eliquis therapy - I recommended f/u with neuro for further recs on blood thinners.       Relevant Medications   apixaban (ELIQUIS) 5 MG TABS tablet       Meds ordered this encounter  Medications  . DISCONTD: apixaban (ELIQUIS) 5 MG TABS tablet    Sig: Take 1 tablet (5 mg total) by mouth 2 (two) times daily.    Dispense:  180 tablet    Refill:  1  . sertraline (ZOLOFT) 25 MG tablet    Sig: Take 0.5 tablets (12.5 mg total) by mouth at bedtime.    Dispense:  45 tablet    Refill:  3   No orders of the defined types were placed in this encounter.   Follow up plan: Return if symptoms worsen or fail to improve.  [name], MD

## 2017-11-24 ENCOUNTER — Ambulatory Visit (INDEPENDENT_AMBULATORY_CARE_PROVIDER_SITE_OTHER): Payer: Medicare Other | Admitting: Family Medicine

## 2017-11-24 ENCOUNTER — Ambulatory Visit: Payer: Medicare Other | Admitting: Family Medicine

## 2017-11-24 ENCOUNTER — Encounter: Payer: Self-pay | Admitting: Family Medicine

## 2017-11-24 VITALS — BP 118/64 | HR 60 | Temp 98.7°F | Ht 63.0 in | Wt 152.8 lb

## 2017-11-24 DIAGNOSIS — I639 Cerebral infarction, unspecified: Secondary | ICD-10-CM | POA: Diagnosis not present

## 2017-11-24 DIAGNOSIS — R4189 Other symptoms and signs involving cognitive functions and awareness: Secondary | ICD-10-CM

## 2017-11-24 DIAGNOSIS — I10 Essential (primary) hypertension: Secondary | ICD-10-CM | POA: Diagnosis not present

## 2017-11-24 DIAGNOSIS — I82622 Acute embolism and thrombosis of deep veins of left upper extremity: Secondary | ICD-10-CM

## 2017-11-24 MED ORDER — APIXABAN 5 MG PO TABS: 5.0000 mg | ORAL_TABLET | Freq: Two times a day (BID) | ORAL | 1 refills | Status: DC

## 2017-11-24 MED ORDER — SERTRALINE HCL 25 MG PO TABS: 12.5000 mg | ORAL_TABLET | Freq: Every day | ORAL | 3 refills | Status: DC

## 2017-11-24 NOTE — Assessment & Plan Note (Signed)
Unclear anticoagulant recommendations post eliquis for acute DVT suffered 08/2017. He is s/p 3v CABG early 2019, h/o hemorrhagic stroke 2007, h/o ischemic stroke late 2019. I recommended return to neurology for their recommendations after he finishes 3 months of eliquis treatment. Currently would only be on aspirin 81mg .

## 2017-11-24 NOTE — Patient Instructions (Addendum)
May stop eliquis when you finish pill bottle.  Continue aspirin indefinitely.  I do recommend follow up with neurology for recommendations on blood thinner dosing after you finish eliquis.

## 2017-11-24 NOTE — Assessment & Plan Note (Signed)
To complete 3 months of eliquis therapy - I recommended f/u with neuro for further recs on blood thinners.

## 2017-11-24 NOTE — Assessment & Plan Note (Signed)
Chronic stable only on carvedilol 3.125mg  bid. Losartan stopped due to hyperkalemia.

## 2017-11-24 NOTE — Assessment & Plan Note (Signed)
Mood seems to be doing better since restarting sertraline at night time - continue 1/2 tablet.

## 2017-12-01 NOTE — Progress Notes (Signed)
Cardiology Office Note   Date:  12/02/2017   ID:  [name] Cruz, [name] Cruz 04-Aug-1955, MRN [id number]  PCP:  [name], MD    No chief complaint on file.  CAD  Wt Readings from Last 3 Encounters:  12/02/17 156 lb (70.8 kg)  11/24/17 152 lb 12 oz (69.3 kg)  10/03/17 159 lb 4 oz (72.2 kg)       History of Present Illness: [name] Cruz is a 62 y.o. male  with history ofhemorrhagic stroke (thought to be secondary to uncontrolled hypertension now with residual cognitive impairment), hypertension, hyperlipidemiawho [facility] with complaints of CP and dyspnea. He was noted to be in atrial fibrillation w/ RVR. Initial ECG was notable for diffuse ST depressions and ST elevation in aVR. He ruled in for NSTEMI. Initial troponin was 0.68 but rose to 19.6. He was started on IV heparin and IV Cardizem drip and taken to the cath lab where he was found to has severe 3 vessel disease noted, 60% LMCA stenosis, chronic total occlusion of ostial LAD, 70% proximal LCX lesion, and 70-90% proximal, mid, and distal RCA. Due to ongoing CP IABPwasplaced. CT surgery consulted and CABG was recommended.   On 02/24/17, he underwentemergent coronary artery bypass grafting x3,performed by Dr. Virl Cagey the left internal mammary to the left anterior descending coronary artery, reverse saphenous vein graft to the distal right coronary artery, reverse saphenous vein graft to the circumflex coronary artery. Echo showed EF of 45-50% but LV gram estimate at time of cath was 25-30%. He did well post op. He was extubated, weaned off pressors and IABP. He required amiodarone for afib and was transitioned to PO and converted to NSR. Given previous h/o hemorrhagic stroke, no OAC was initiated.   Since the last visit, he is doing well.    Denies : Chest pain. Dizziness. Leg edema. Nitroglycerin use. Orthopnea. Palpitations. Paroxysmal nocturnal dyspnea. Shortness of breath. Syncope.    Not walking regularly.    Past Medical History:  Diagnosis Date  . Cognitive impairment 2007   after stroke, saw rehab but told to stop because was too upsetting to him  . History of chicken pox   . HLD (hyperlipidemia)   . HTN (hypertension)   . NSTEMI (non-ST elevated myocardial infarction) (HCC) 02/21/2017  . Obesity   . Stroke, hemorrhagic (HCC) 2007   thought 2/2 HTN (240sbp); residual cognitive impairment, loss of R peripheral field, no driving    Past Surgical History:  Procedure Laterality Date  . ANKLE SURGERY  1990s   right foot with plate and screws  . CORONARY ARTERY BYPASS GRAFT N/A 02/24/2017   3v Procedure: CORONARY ARTERY BYPASS GRAFTING (CABG) x 3 ON PUMP USING LEFT INTERNAL MAMMARY ARTERY TO LEFT ANTERIOR DESENDING CORNARY ARTERY, RIGHT GREATER SAPHENOUS VEIN TO LEFT CIRCUMFLEX ARTERY AND POSTERIOR DESENDING ARTERY. RIGHT GREATER SAPHENOUS VEIN OBTAINED VIA ENDOVEIN HARVEST.;  Surgeon: Delight Ovens, MD  . IABP INSERTION N/A 02/21/2017   Procedure: IABP Insertion;  Surgeon: Yvonne Kendall, MD;  Location: [facility] INVASIVE CV LAB;  Service: Cardiovascular;  Laterality: N/A;  . LEFT HEART CATH AND CORONARY ANGIOGRAPHY N/A 02/21/2017   Procedure: LEFT HEART CATH AND CORONARY ANGIOGRAPHY;  Surgeon: Yvonne Kendall, MD;  Location: [facility] INVASIVE CV LAB;  Service: Cardiovascular;  Laterality: N/A;  . TEE WITHOUT CARDIOVERSION N/A 02/24/2017   Procedure: TRANSESOPHAGEAL ECHOCARDIOGRAM (TEE);  Surgeon: Delight Ovens, MD;  Location: [facility] OR;  Service: Open Heart Surgery;  Laterality: N/A;     Current Outpatient Medications  Medication Sig Dispense Refill  . apixaban (ELIQUIS) 5 MG TABS tablet Take 1 tablet (5 mg total) by mouth 2 (two) times daily. Started 08/22/2017 60 tablet 0  . aspirin EC 81 MG EC tablet Take 1 tablet (81 mg total) by mouth daily. 30 tablet 0  . atorvastatin (LIPITOR) 80 MG tablet TAKE 1 TABLET (80 MG TOTAL) BY MOUTH DAILY. FOR CHOLESTEROL 90 tablet 1  .  carvedilol (COREG) 3.125 MG tablet Take 1 tablet (3.125 mg total) by mouth 2 (two) times daily. 180 tablet 3  . Cyanocobalamin (B-12) 1000 MCG SUBL Place 1 tablet under the tongue daily. 30 each 11  . ezetimibe (ZETIA) 10 MG tablet Take 1 tablet (10 mg total) by mouth daily. 90 tablet 2  . levETIRAcetam (KEPPRA) 1000 MG tablet Take 1 tablet (1,000 mg total) by mouth 2 (two) times daily. 180 tablet 3  . Multiple Vitamin (MULTIVITAMIN WITH MINERALS) TABS tablet Take 1 tablet by mouth daily. 30 tablet 0  . sertraline (ZOLOFT) 25 MG tablet Take 0.5 tablets (12.5 mg total) by mouth at bedtime. 45 tablet 3  . thiamine 100 MG tablet Take 1 tablet (100 mg total) by mouth daily. 30 tablet 0   No current facility-administered medications for this visit.     Allergies:   Losartan    Social History:  The patient  reports that he has never smoked. He has quit using smokeless tobacco. He reports that he drank about 14.0 standard drinks of alcohol per week. He reports that he does not use drugs.   Family History:  The patient's family history includes Alcohol abuse in his father; Alzheimer's disease in his maternal grandfather; Cancer in his mother.    ROS:  Please see the history of present illness.   Otherwise, review of systems are positive for poor dietary choices; lack of exercise.   All other systems are reviewed and negative.    PHYSICAL EXAM: VS:  BP 116/72   Pulse 72   Ht 5\' 3"  (1.6 m)   Wt 156 lb (70.8 kg)   SpO2 90%   BMI 27.63 kg/m  , BMI Body mass index is 27.63 kg/m. GEN: Well nourished, well developed, in no acute distress  HEENT: normal  Neck: no JVD, carotid bruits, or masses Cardiac: RRR; no murmurs, rubs, or gallops,no edema  Respiratory:  clear to auscultation bilaterally, normal work of breathing GI: soft, nontender, nondistended, + BS MS: no deformity or atrophy  Skin: warm and dry, no rash Neuro:  Strength and sensation are intact Psych: euthymic mood, full  affect   Recent Labs: 02/23/2017: TSH 2.054 08/11/2017: ALT 41 09/24/2017: Magnesium 1.9 10/01/2017: Hemoglobin 12.8; Platelets 232 10/03/2017: BUN 14; Creatinine, Ser 0.93; Potassium 5.8; Sodium 140   Lipid Panel    Component Value Date/Time   CHOL 177 07/11/2017 0950   TRIG 54 08/12/2017 2225   HDL 96 07/11/2017 0950   CHOLHDL 1.8 07/11/2017 0950   CHOLHDL 3 01/28/2017 0752   VLDL 23.6 01/28/2017 0752   LDLCALC 65 07/11/2017 0950   LDLDIRECT 122.0 07/22/2016 0959     Other studies Reviewed: Additional studies/ records that were reviewed today with results demonstrating: LDL 65 in June 2019.   ASSESSMENT AND PLAN:  1. CAD: No angina. COntinue aggressive secondary prevention,.  Had recent hyperkalemia.  WiIl recheck BMet.  2. Systolic dysfunction: Appears euvolemic.  3. PAF: Held OAC due to prior ICH.   4.  H/o hem stroke.: Occurred with a fall in the past. 5. Hyperlipidemia: LDL 65. COntinue current lipid lowering therapy.  6. HTN: The current medical regimen is effective;  continue present plan and medications. 7. UE DVT: Now on Eliquis.  He will finish current supply and stop as he was treated for 3 months.  Reviewed note from Dr. Sharen HonesGutierrez.  Will stop Eliquis at this point.   Encouraged exercise.  He can walk around the block.     Current medicines are reviewed at length with the patient today.  The patient concerns regarding his medicines were addressed.  The following changes have been made:  No change  Labs/ tests ordered today include:  No orders of the defined types were placed in this encounter.   Recommend 150 minutes/week of aerobic exercise Low fat, low carb, high fiber diet recommended  Disposition:   FU in 1 year   Signed, Lance MussJayadeep Miquan Tandon, MD  12/02/2017 11:38 AM    [facility] [address], [place], [place]  [address] Phone: [phone]; Fax: [phone]

## 2017-12-02 ENCOUNTER — Encounter: Payer: Self-pay | Admitting: Interventional Cardiology

## 2017-12-02 ENCOUNTER — Ambulatory Visit: Payer: Medicare Other | Admitting: Interventional Cardiology

## 2017-12-02 VITALS — BP 116/72 | HR 72 | Ht 63.0 in | Wt 156.0 lb

## 2017-12-02 DIAGNOSIS — E782 Mixed hyperlipidemia: Secondary | ICD-10-CM

## 2017-12-02 DIAGNOSIS — I10 Essential (primary) hypertension: Secondary | ICD-10-CM | POA: Diagnosis not present

## 2017-12-02 DIAGNOSIS — I2510 Atherosclerotic heart disease of native coronary artery without angina pectoris: Secondary | ICD-10-CM

## 2017-12-02 DIAGNOSIS — I480 Paroxysmal atrial fibrillation: Secondary | ICD-10-CM

## 2017-12-02 DIAGNOSIS — I5022 Chronic systolic (congestive) heart failure: Secondary | ICD-10-CM | POA: Diagnosis not present

## 2017-12-02 LAB — BASIC METABOLIC PANEL
BUN/Creatinine Ratio: 19 (ref 10–24)
BUN: 15 mg/dL (ref 8–27)
CALCIUM: 9.6 mg/dL (ref 8.6–10.2)
CHLORIDE: 99 mmol/L (ref 96–106)
CO2: 25 mmol/L (ref 20–29)
Creatinine, Ser: 0.77 mg/dL (ref 0.76–1.27)
GFR calc non Af Amer: 97 mL/min/{1.73_m2} (ref >59)
GFR, EST AFRICAN AMERICAN: 112 mL/min/{1.73_m2} (ref >59)
Glucose: 85 mg/dL (ref 65–99)
Potassium: 5.3 mmol/L — ABNORMAL HIGH (ref 3.5–5.2)
Sodium: 139 mmol/L (ref 134–144)

## 2017-12-02 MED ORDER — CARVEDILOL 3.125 MG PO TABS: 3.1250 mg | ORAL_TABLET | Freq: Two times a day (BID) | ORAL | 3 refills | Status: DC

## 2017-12-02 MED ORDER — ATORVASTATIN CALCIUM 80 MG PO TABS: ORAL_TABLET | ORAL | 3 refills | Status: DC

## 2017-12-02 MED ORDER — EZETIMIBE 10 MG PO TABS: 10.0000 mg | ORAL_TABLET | Freq: Every day | ORAL | 3 refills | Status: DC

## 2017-12-02 NOTE — Patient Instructions (Signed)
Medication Instructions:  Your physician has recommended you make the following change in your medication:   STOP: eliquis  If you need a refill on your cardiac medications before your next appointment, please call your pharmacy.   Lab work: TODAY: BMET  If you have labs (blood work) drawn today and your tests are completely normal, you will receive your results only by: Marland Kitchen. MyChart Message (if you have MyChart) OR . A paper copy in the mail If you have any lab test that is abnormal or we need to change your treatment, we will call you to review the results.  Testing/Procedures: None ordered  Follow-Up: At [facility], you and your health needs are our priority.  As part of our continuing mission to provide you with exceptional heart care, we have created designated Provider Care Teams.  These Care Teams include your primary Cardiologist (physician) and Advanced Practice Providers (APPs -  Physician Assistants and Nurse Practitioners) who all work together to provide you with the care you need, when you need it. . You will need a follow up appointment in 1 year.  Please call our office 2 months in advance to schedule this appointment.  You may see Everette RankJay Varanasi, MD or one of the following Advanced Practice Providers on your designated Care Team:   . Robbie LisBrittainy Simmons, PA-C . Dayna Dunn, PA-C . Jacolyn ReedyMichele Lenze, PA-C  Any Other Special Instructions Will Be Listed Below (If Applicable).

## 2017-12-09 ENCOUNTER — Telehealth: Payer: Self-pay | Admitting: Interventional Cardiology

## 2017-12-09 ENCOUNTER — Telehealth: Payer: Self-pay | Admitting: Family Medicine

## 2017-12-09 ENCOUNTER — Other Ambulatory Visit: Payer: Self-pay | Admitting: *Deleted

## 2017-12-09 MED ORDER — ATORVASTATIN CALCIUM 80 MG PO TABS: ORAL_TABLET | ORAL | 3 refills | Status: DC

## 2017-12-09 MED ORDER — CARVEDILOL 3.125 MG PO TABS: 3.1250 mg | ORAL_TABLET | Freq: Two times a day (BID) | ORAL | 3 refills | Status: DC

## 2017-12-09 NOTE — Telephone Encounter (Signed)
Spoke with pt's mom, Dot, DPR on file. She has been made aware that we will send in 90 rx for Atorvastatin to [facility] so they can mail it to pt and keep her from having to go get it and taking it to him since he lives a bit away from her.  She thanked me for the help.

## 2017-12-09 NOTE — Telephone Encounter (Signed)
Pt's mother called office stating [employer] needs Dr.Gutierrez to fax over the medications that are prescribed to him so that they can start sending the medication through mail order. I did explain that we would need the name of the mail order pharmacy. Pt's mother gave me an account number: [phone], and Fax number: [phone]. Pt's mother is requesting a cb.

## 2017-12-09 NOTE — Telephone Encounter (Signed)
New Message    *STAT* If patient is at the pharmacy, call can be transferred to refill team.   1. Which medications need to be refilled? (please list name of each medication and dose if known) atorvastatin (LIPITOR) 80 MG tablet  2. Which pharmacy/location (including street and city if local pharmacy) is medication to be sent to? [facility] [place] [phone]  3. Do they need a 30 day or 90 day supply? 90 day   A new rx needs to be sent for a 90 day supply

## 2017-12-10 NOTE — Telephone Encounter (Signed)
90-day rxs were e-scribed to [facility].

## 2017-12-16 MED ORDER — SERTRALINE HCL 25 MG PO TABS: 12.5000 mg | ORAL_TABLET | Freq: Every day | ORAL | 3 refills | Status: DC

## 2017-12-16 NOTE — Addendum Note (Signed)
Addended by: Desmond DikeKNIGHT, Ferdinando Lodge H on: 12/16/2017 11:49 AM   Modules accepted: Orders

## 2017-12-26 ENCOUNTER — Other Ambulatory Visit: Payer: Self-pay

## 2017-12-26 MED ORDER — ASPIRIN 81 MG PO TBEC: 81.0000 mg | DELAYED_RELEASE_TABLET | Freq: Every day | ORAL | 0 refills | Status: DC

## 2017-12-26 MED ORDER — ADULT MULTIVITAMIN W/MINERALS CH: 1.0000 | ORAL_TABLET | Freq: Every day | ORAL | 0 refills | Status: DC

## 2017-12-26 MED ORDER — ADULT MULTIVITAMIN W/MINERALS CH: 1.0000 | ORAL_TABLET | Freq: Every day | ORAL | 0 refills | Status: AC

## 2017-12-26 MED ORDER — B-12 1000 MCG SL SUBL: 1.0000 | SUBLINGUAL_TABLET | Freq: Every day | SUBLINGUAL | 0 refills | Status: DC

## 2017-12-26 NOTE — Telephone Encounter (Addendum)
E-scribed vit B12.  Printed rx for aspirin and multi vit, then faxed to [facility].

## 2017-12-26 NOTE — Addendum Note (Signed)
Addended by: Nanci PinaGOINS, Ved Martos on: 12/26/2017 04:29 PM   Modules accepted: Orders

## 2017-12-29 ENCOUNTER — Other Ambulatory Visit: Payer: Self-pay

## 2017-12-29 NOTE — Patient Outreach (Signed)
[facility] Care Management  12/29/2017  Cipriano MileBilly J Carter 04-03-1955 [id number]   Medication Adherence call to Mr. Theador HawthorneBilly Schlender patient is due on Losartan 25 mg spoke with patient family member patient is no longer taking Losartan 25 mg because it was on back order and doctor switch it to a different one. Mr. Hyacinth MeekerMiller is showing past due under [facility].   Lillia AbedAna Ollison-Moran CPhT Pharmacy Technician [facility] Care Management Direct Dial [phone]  Fax [phone] [email or web address]

## 2018-01-01 ENCOUNTER — Ambulatory Visit: Payer: Medicare Other | Admitting: Adult Health

## 2018-01-05 ENCOUNTER — Telehealth: Payer: Self-pay

## 2018-01-05 ENCOUNTER — Ambulatory Visit: Payer: Medicare Other | Admitting: Adult Health

## 2018-01-05 NOTE — Telephone Encounter (Signed)
Patient no show for appt today. 

## 2018-01-05 NOTE — Progress Notes (Deleted)
[facility] [address] [place]. [place] [address] [phone]       OFFICE CONSULT NOTE  Mr. MIRANDA GARBER Date of Birth:  Jun 13, 1955 Medical Record Number:  [id number]   Referring MD:   Tomasa Hosteller  Reason for Referral:  seizures  HPI: Mr Vasudevan is 58 year Caucasian male seen today for initial office consultation visit. Is a complaint by his mother. History is obtained from them and review of electronic medical records. I have personally viewed imaging films in PACS.BENAIAH BEHAN is an 62 y.o. male  With PMH significant for Seizures, hemorrhagic CVA 2004 ( residual cognitive deficits only), alcohol abuse who presented to [facility] via EMS with seizures.on 08/09/17 .Per family patient was in food [employer] where he didn't feel quite right spun around twice, fell to the floor and began to have a seizure. Transported to [facility] via EMS. Upon arrival to [facility] patient had a second seizure witnessed by staff  That lasted approximately 30 seconds where his eyes rolled to the back of his head and he began having a tonic/clonic seizure. 1 mg ativan given at this time and patient was taken to CT. In route to CT patient began having runs of V-tach. While in CT patient was post-ictal with roving eye movements, and then went into pulseless v-tach. At this time chest compressions were started. Once patient was back in room in the process of being intubated he began to have another tonic/clonic seizure. Patient was intubated without any medications. 1g of keppra was given, propofol started at 16mcg/min/kg ( unable to titrate up due to hypotension). Family states that patient does not have a history of seizures. Chart review revealed that [facility] Thad Ranger in 2014 for seizures. Patient was discharged on Dilantin at that time with follow-up  appointment with  Outpatient neurology [facility]. Patient last filled phenytoin 100 mg TID prescription in march 2019 and it would have run out  in June. Questioning compliance.upon evaluation in the ED basic lab work and urinalysis were unremarkable..CT head: no intracranial abnormality, stable chronic ventriculomegaly with left temporal occipital encephalomalacia and central volume loss It was felt that the patient likely had seizures in setting of noncompliance off Dilantin and discontinuation. His old hemorrhagic stroke was felt to be the focus for his symptomatic seizures. He had at least 2 witnessed seizures in the ER possibly a third one. His cardiac monitor showed possibly ventricular tachycardia resulting in cardiac arrest. He was loaded with 1 g of Keppra with maintenance dose of 1 g twice daily.  Vimpat was subsequently added as well. EEG obtained on 08/09/17 showed frequent left frontotemporal seizures bridged by periodic lateralized discharges on the in interictal ictal continuum consistent with partial status epilepticus from the left frontotemporal region.Vimpat 200 mg every 12 hourly was added and when necessary lorazepam was also used. Patient underwent long-term EEG monitoring overnight with simultaneous video monitoring which recorded several electrographic seizures arising from the right temporal region but no obvious clinical accompaniment to these electrographic seizures.follow-up prolonged video EEG monitoring showed no further evidence of clinical or subclinical seizures.patient was intubated and Sedated with propofol and Versed. However repeat video EEG recording on 08/12/17 showed persistent clinical or subclinical seizures present throughout the recording. Dilantin was added and patient was continued on Keppra and Vimpat. Further repeat EEG showed no further seizures. His sedation was withdrawn.patient was eventually extubated. He was discharged on Keppra and Dilantin and Vimpat was discontinued.  Initial visit 09/25/2017  Dr. Pearlean BrownieSethi: the patient states his done well since discharge has not been taking Dilantin and said his been  taking Keppra 1 g twice daily only. He is living at home with his family. His abnormal further witnessed seizures. His back to his baseline. He still complains of some minor pain in the back of his head from the fall related to the seizure and injury. 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 He has residual right-sided homonymous hemianopsia which is persistent. He does not drive. States his blood pressure is well controlled and today it is 114/72.  Interval history 01/05/2018: Patient is being seen today for 7833-month follow-up visit.  Patient did have ED visit on 10/01/2017 with main complaint of right-sided headache without any new neuro deficits or changes in behavior.  Initial BMP showed potassium 6.1 but on recheck 5.5.  CT head negative.  EKG unchanged.  Patient was discharged home in stable condition with recommendations of PCP follow-up for potassium recheck.    ROS:   14 system review of systems is positive for  Weight loss, fatigue, chest pain, leg swelling, ringing in the ears, loss of vision, shortness of breath, easy bruising and bleeding, slurred speech, seizure, decreased energy, sleepiness and all systems negative  PMH:  Past Medical History:  Diagnosis Date  . Cognitive impairment 2007   after stroke, saw rehab but told to stop because was too upsetting to him  . History of chicken pox   . HLD (hyperlipidemia)   . HTN (hypertension)   . NSTEMI (non-ST elevated myocardial infarction) (HCC) 02/21/2017  . Obesity   . Stroke, hemorrhagic (HCC) 2007   thought 2/2 HTN (240sbp); residual cognitive impairment, loss of R peripheral field, no driving    Social History:  Social History   Socioeconomic History  . Marital status: Single    Spouse name: Not on file  . Number of children: 2  . Years of education: Not on file  . Highest education level: 10th grade  Occupational History    Comment: Unemployed   Social  Needs  . Financial resource strain: Not on file  . Food insecurity:    Worry: Not on file    Inability: Not on file  . Transportation needs:    Medical: Not on file    Non-medical: Not on file  Tobacco Use  . Smoking status: Never Smoker  . Smokeless tobacco: Former Engineer, waterUser  Substance and Sexual Activity  . Alcohol use: Not Currently    Alcohol/week: 14.0 standard drinks    Types: 12 Cans of beer, 2 Shots of liquor per week  . Drug use: No  . Sexual activity: Not on file  Lifestyle  . Physical activity:    Days per week: Not on file    Minutes per session: Not on file  . Stress: Not on file  Relationships  . Social connections:    Talks on phone: Not on file    Gets together: Not on file    Attends religious service: Not on file    Active member of club or organization: Not on file    Attends meetings of clubs or organizations: Not on file    Relationship status: Not on file  . Intimate partner violence:    Fear of current or ex partner: Not on file    Emotionally abused: Not on file    Physically abused: Not on file  Forced sexual activity: Not on file  Other Topics Concern  . Not on file  Social History Narrative   Caffeine: 1 pot coffee   Lives with oldest sister in [place]   Occupation: disabled, was Corporate investment bankerconstruction worker   Does not drive.   Activity: no regular exercise   Diet: good water, drinks V8 juice   Right handed     Medications:   Current Outpatient Medications on File Prior to Visit  Medication Sig Dispense Refill  . aspirin 81 MG EC tablet Take 1 tablet (81 mg total) by mouth daily. 90 tablet 0  . atorvastatin (LIPITOR) 80 MG tablet TAKE 1 TABLET (80 MG TOTAL) BY MOUTH DAILY. FOR CHOLESTEROL 90 tablet 3  . carvedilol (COREG) 3.125 MG tablet Take 1 tablet (3.125 mg total) by mouth 2 (two) times daily. 180 tablet 3  . Cyanocobalamin (B-12) 1000 MCG SUBL Place 1 tablet under the tongue daily. 90 each 0  . ezetimibe (ZETIA) 10 MG tablet Take 1 tablet (10  mg total) by mouth daily. 90 tablet 3  . levETIRAcetam (KEPPRA) 1000 MG tablet Take 1 tablet (1,000 mg total) by mouth 2 (two) times daily. 180 tablet 3  . Multiple Vitamin (MULTIVITAMIN WITH MINERALS) TABS tablet Take 1 tablet by mouth daily. 90 tablet 0  . sertraline (ZOLOFT) 25 MG tablet Take 0.5 tablets (12.5 mg total) by mouth at bedtime. 45 tablet 3  . thiamine 100 MG tablet Take 1 tablet (100 mg total) by mouth daily. 30 tablet 0   No current facility-administered medications on file prior to visit.     Allergies:   Allergies  Allergen Reactions  . Losartan Other (See Comments)    hyperkalemia    Physical Exam General: well developed, well nourished middle-age Caucasian male, seated, in no evident distress Head: head normocephalic and atraumatic.   Neck: supple with no carotid or supraclavicular bruits Cardiovascular: regular rate and rhythm, no murmurs Musculoskeletal: no deformity Skin:  no rash/petichiae prominent midline chest surgical scar from cardiac surgery Vascular:  Normal pulses all extremities  Neurologic Exam Mental Status: Awake and fully alert. Oriented to place and time. Recent and remote memoryi dminished. Recall 1/3 only. Attention span, concentration and fund of knowledge appropriate. Mood and affect appropriate.  Cranial Nerves: Fundoscopic exam reveals sharp disc margins. Pupils equal, briskly reactive to light. Extraocular movements full without nystagmus. Visual fields show dense right homonymous hemianopsial to confrontation. Hearing intact. Facial sensation intact. Face, tongue, palate moves normally and symmetrically.  Motor: Normal bulk and tone. Normal strength in all tested extremity muscles. Sensory.: intact to touch , pinprick , position and vibratory sensation.  Coordination: Rapid alternating movements normal in all extremities. Finger-to-nose and heel-to-shin performed accurately bilaterally. Gait and Station: Arises from chair without  difficulty. Stance is normal. Gait demonstrates normal stride length and balance . Unable to heel, toe and tandem walk without difficulty.  Reflexes: 1+ and symmetric. Toes downgoing.       ASSESSMENT:  62 year old Caucasian male with recent hospitalization for complex partial status epilepticus with history of symptomatic epilepsy following remote hemorrhagic stroke and ethanol abuse.     PLAN: I had a long discussion with the patient and his mother as well as I spoke to his sister over the phone regarding his recent hospitalization for status epilepticus. I recommend he continue Keppra 1 gram twice daily which is tolerating well without any side effects. I also advised him to avoid seizure provoking factors like sleep deprivation, medication noncompliance and  irregular eating or sleeping habits.She will also stay on eliquis for his deep vein thrombosis in the left upper extremity and follow-up with his primary care physician for that. I gave him refill of Keppra. He will not drive.he will maintain strict control of hypertension with blood pressure goal below 140/90.   Greater than 50% time during this 45 minute consultation visit was spent on counseling and coordination of care about his seizures, remote intracerebral hemorrhage and answering questions.He'll return for follow-up in 3 months with nurse practitioner or call earlier if necessary.  George Hugh, AGNP-BC  [facility] [address] [place], [place] [address]  Phone [phone] Fax [phone] Note: This document was prepared with digital dictation and possible smart phrase technology. Any transcriptional errors that result from this process are unintentional.

## 2018-01-06 ENCOUNTER — Other Ambulatory Visit: Payer: Self-pay

## 2018-01-06 NOTE — Telephone Encounter (Signed)
Mail order sent us a request to send in new r/x for patient's keppra to mail order.  Appears Dr. Pearlean BrownieSethi manages this medication for patient and he sent in 1 years r/x in September of this year to his local pharmacy.  As Dr. Pearlean BrownieSethi is the neurologist primarily managing this medication, I have left a message (okay per DPR) on home answering machine explaining this to patient and that he should contact their office to have r/x switched to mail order.  Patient does have available medication (1 years worth) at local pharmacy if he needs to carry him until this is straightened out.  Thanks.

## 2018-01-08 ENCOUNTER — Encounter: Payer: Self-pay | Admitting: Adult Health

## 2018-01-15 ENCOUNTER — Encounter: Payer: Self-pay | Admitting: Adult Health

## 2018-01-15 ENCOUNTER — Ambulatory Visit: Payer: Medicare Other | Admitting: Adult Health

## 2018-01-15 VITALS — BP 113/75 | HR 59 | Ht 63.0 in | Wt 161.0 lb

## 2018-01-15 DIAGNOSIS — E782 Mixed hyperlipidemia: Secondary | ICD-10-CM

## 2018-01-15 DIAGNOSIS — G40011 Localization-related (focal) (partial) idiopathic epilepsy and epileptic syndromes with seizures of localized onset, intractable, with status epilepticus: Secondary | ICD-10-CM | POA: Diagnosis not present

## 2018-01-15 DIAGNOSIS — G40909 Epilepsy, unspecified, not intractable, without status epilepticus: Secondary | ICD-10-CM | POA: Diagnosis not present

## 2018-01-15 DIAGNOSIS — R4189 Other symptoms and signs involving cognitive functions and awareness: Secondary | ICD-10-CM | POA: Diagnosis not present

## 2018-01-15 DIAGNOSIS — I10 Essential (primary) hypertension: Secondary | ICD-10-CM | POA: Diagnosis not present

## 2018-01-15 MED ORDER — LEVETIRACETAM ER 500 MG PO TB24: 1000.0000 mg | ORAL_TABLET | Freq: Two times a day (BID) | ORAL | 3 refills | Status: DC

## 2018-01-15 NOTE — Progress Notes (Signed)
[facility] [address] [place]. [place] [id number] [phone]       OFFICE CONSULT NOTE  [name] Date of Birth:  1956-01-15 Medical Record Number:  [id number]   Referring MD:   Tomasa HostellerJavier Guitierez  Reason for Referral:  seizures  Chief Complaint  Patient presents with  . Follow-up    Epilepsy and stroke follow up room in back hallway pt with mom Dot     HPI: [name] is 89 year Caucasian male seen today for initial office consultation visit. Is a complaint by his mother. History is obtained from them and review of electronic medical records. I have personally viewed imaging films in PACS.[name] Cruz is an 63 y.o. male  With PMH significant for Seizures, hemorrhagic CVA 2004 ( residual cognitive deficits only), alcohol abuse who [facility] with seizures.on 08/09/17 .Per family patient was in food [employer] where he didn't feel quite right spun around twice, fell to the floor and began to have a seizure. Transported [facility]. [facility] patient had a second seizure witnessed by staff  That lasted approximately 30 seconds where his eyes rolled to the back of his head and he began having a tonic/clonic seizure. 1 mg ativan given at this time and patient was taken to CT. In route to CT patient began having runs of V-tach. While in CT patient was post-ictal with roving eye movements, and then went into pulseless v-tach. At this time chest compressions were started. Once patient was back in room in the process of being intubated he began to have another tonic/clonic seizure. Patient was intubated without any medications. 1g of keppra was given, propofol started at 572mcg/min/kg ( unable to titrate up due to hypotension). Family states that patient does not have a history of seizures. Chart review revealed that [facility] Thad Rangereynolds in 2014 for seizures. Patient was discharged on Dilantin at that time with follow-up   appointment with  Outpatient neurology [facility]. Patient last filled phenytoin 100 mg TID prescription in march 2019 and it would have run out in June. Questioning compliance.upon evaluation in the ED basic lab work and urinalysis were unremarkable..CT head: no intracranial abnormality, stable chronic ventriculomegaly with left temporal occipital encephalomalacia and central volume loss It was felt that the patient likely had seizures in setting of noncompliance off Dilantin and discontinuation. His old hemorrhagic stroke was felt to be the focus for his symptomatic seizures. He had at least 2 witnessed seizures in the ER possibly a third one. His cardiac monitor showed possibly ventricular tachycardia resulting in cardiac arrest. He was loaded with 1 g of Keppra with maintenance dose of 1 g twice daily.  Vimpat was subsequently added as well. EEG obtained on 08/09/17 showed frequent left frontotemporal seizures bridged by periodic lateralized discharges on the in interictal ictal continuum consistent with partial status epilepticus from the left frontotemporal region.Vimpat 200 mg every 12 hourly was added and when necessary lorazepam was also used. Patient underwent long-term EEG monitoring overnight with simultaneous video monitoring which recorded several electrographic seizures arising from the right temporal region but no obvious clinical accompaniment to these electrographic seizures.follow-up prolonged video EEG monitoring showed no further evidence of clinical or subclinical seizures.patient was intubated and Sedated with propofol and Versed. However repeat video EEG recording on 08/12/17 showed persistent clinical or subclinical seizures present throughout the recording. Dilantin was added and patient was continued on Keppra  and Vimpat. Further repeat EEG showed no further seizures. His sedation was withdrawn.patient was eventually extubated. He was discharged on Keppra and Dilantin and Vimpat was discontinued.     Initial visit 09/25/2017  Dr. Pearlean Brownie: the patient states his done well since discharge has not been taking Dilantin and said his been taking Keppra 1 g twice daily only. He is living at home with his family. His abnormal further witnessed seizures. His back to his baseline. He still complains of some minor pain in the back of his head from the fall related to the seizure and injury. 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 He has residual right-sided homonymous hemianopsia which is persistent. He does not drive. States his blood pressure is well controlled and today it is 114/72.  Interval history 01/15/2018: Patient is being seen today for 83-month follow-up visit and is accompanied by his mother.  Patient did have ED visit on 10/01/2017 with main complaint of right-sided headache without any new neuro deficits or changes in behavior.  Initial BMP showed potassium 6.1 but on recheck 5.5.  CT head negative.  EKG unchanged.  Patient was discharged home in stable condition with recommendations of PCP follow-up for potassium recheck.  He was followed by his cardiologist with appointment on 11/2017 with recommendations of discontinuing Eliquis as he is completed 49-month therapy for DVT and initiated aspirin 81 mg daily for secondary stroke prevention.  He continues this without side effects of bleeding or bruising.  He continues on atorvastatin for HLD management without side effects myalgias.  Blood pressure today 113/75.  He continues on Keppra 1000 mg twice daily without any recent seizure activity.  His mother is concerned as he has excessive daytime fatigue with frequent napping despite sleeping well at night.  She states this is been present since hospital discharge.  She is concerned that this possibly could be medication related.  Patient does stay active during the day with ambulating frequently.  He does currently live with his sister but is  able to manage all ADLs independently.  He denies any recent falls.  He continues to have residual right homonymous hemianopsia and denies any worsening.  He does endorse superficial chest pain at incision site which has been present without any worsening since hospitalization.  He did have recent follow-up with cardiology without any further recommendations.  No further concerns at this time.  Denies new or worsening stroke/TIA symptoms.    ROS:   14 system review of systems is positive for chest pain, daytime sleepiness, memory loss, speech difficulty, agitation and confusion and all systems negative  PMH:  Past Medical History:  Diagnosis Date  . Cognitive impairment 2007   after stroke, saw rehab but told to stop because was too upsetting to him  . History of chicken pox   . HLD (hyperlipidemia)   . HTN (hypertension)   . NSTEMI (non-ST elevated myocardial infarction) (HCC) 02/21/2017  . Obesity   . Stroke, hemorrhagic (HCC) 2007   thought 2/2 HTN (240sbp); residual cognitive impairment, loss of R peripheral field, no driving    Social History:  Social History   Socioeconomic History  . Marital status: Single    Spouse name: Not on file  . Number of children: 2  . Years of education: Not on file  . Highest education level: 10th grade  Occupational History    Comment: Unemployed   Social Needs  . Financial resource strain: Not  on file  . Food insecurity:    Worry: Not on file    Inability: Not on file  . Transportation needs:    Medical: Not on file    Non-medical: Not on file  Tobacco Use  . Smoking status: Never Smoker  . Smokeless tobacco: Former Engineer, waterUser  Substance and Sexual Activity  . Alcohol use: Not Currently    Alcohol/week: 14.0 standard drinks    Types: 12 Cans of beer, 2 Shots of liquor per week  . Drug use: No  . Sexual activity: Not on file  Lifestyle  . Physical activity:    Days per week: Not on file    Minutes per session: Not on file  . Stress: Not  on file  Relationships  . Social connections:    Talks on phone: Not on file    Gets together: Not on file    Attends religious service: Not on file    Active member of club or organization: Not on file    Attends meetings of clubs or organizations: Not on file    Relationship status: Not on file  . Intimate partner violence:    Fear of current or ex partner: Not on file    Emotionally abused: Not on file    Physically abused: Not on file    Forced sexual activity: Not on file  Other Topics Concern  . Not on file  Social History Narrative   Caffeine: 1 pot coffee   Lives with oldest sister in [place]   Occupation: disabled, was Corporate investment bankerconstruction worker   Does not drive.   Activity: no regular exercise   Diet: good water, drinks V8 juice   Right handed     Medications:   Current Outpatient Medications on File Prior to Visit  Medication Sig Dispense Refill  . aspirin 81 MG EC tablet Take 1 tablet (81 mg total) by mouth daily. 90 tablet 0  . atorvastatin (LIPITOR) 80 MG tablet TAKE 1 TABLET (80 MG TOTAL) BY MOUTH DAILY. FOR CHOLESTEROL 90 tablet 3  . carvedilol (COREG) 3.125 MG tablet Take 1 tablet (3.125 mg total) by mouth 2 (two) times daily. 180 tablet 3  . Cyanocobalamin (B-12) 1000 MCG SUBL Place 1 tablet under the tongue daily. 90 each 0  . ezetimibe (ZETIA) 10 MG tablet Take 1 tablet (10 mg total) by mouth daily. 90 tablet 3  . levETIRAcetam (KEPPRA) 1000 MG tablet Take 1 tablet (1,000 mg total) by mouth 2 (two) times daily. 180 tablet 3  . Multiple Vitamin (MULTIVITAMIN WITH MINERALS) TABS tablet Take 1 tablet by mouth daily. 90 tablet 0  . sertraline (ZOLOFT) 25 MG tablet Take 0.5 tablets (12.5 mg total) by mouth at bedtime. 45 tablet 3  . thiamine 100 MG tablet Take 1 tablet (100 mg total) by mouth daily. 30 tablet 0   No current facility-administered medications on file prior to visit.     Allergies:   Allergies  Allergen Reactions  . Losartan Other (See Comments)     hyperkalemia    Physical Exam General: well developed, well nourished middle-age pleasant Caucasian male, seated, in no evident distress Head: head normocephalic and atraumatic.   Neck: supple with no carotid or supraclavicular bruits Cardiovascular: regular rate and rhythm, no murmurs Musculoskeletal: no deformity Skin:  no rash/petichiae prominent midline chest surgical scar from cardiac surgery Vascular:  Normal pulses all extremities  Neurologic Exam Mental Status: Awake and fully alert. Oriented to place and time. Recent and remote memoryi  dminished.  Attention span, concentration and fund of knowledge appropriate. Mood and affect appropriate.  Cranial Nerves: Pupils equal, briskly reactive to light. Extraocular movements full without nystagmus. Visual fields show dense right homonymous hemianopsia to confrontation. Hearing intact. Facial sensation intact. Face, tongue, palate moves normally and symmetrically.  Motor: Normal bulk and tone. Normal strength in all tested extremity muscles. Sensory.: intact to touch , pinprick , position and vibratory sensation.  Coordination: Rapid alternating movements normal in all extremities. Finger-to-nose and heel-to-shin performed accurately bilaterally. Gait and Station: Arises from chair without difficulty. Stance is normal. Gait demonstrates normal stride length and balance without use of assistive device. Unable to heel, toe and tandem walk without difficulty.  Reflexes: 1+ and symmetric. Toes downgoing.       ASSESSMENT:  63 year old Caucasian male with recent hospitalization for complex partial status epilepticus with history of symptomatic epilepsy following remote hemorrhagic stroke and ethanol abuse.  Patient is being seen today for 58-month follow-up visit and overall has been doing well without any recurrent seizure activity.      PLAN: -Continue aspirin 81 mg and atorvastatin for secondary stroke prevention -Continue to follow-up  with PCP in regards to HLD and HTN management -Due to daytime fatigue, it is recommended to change Keppra to extended release therefore Keppra XR 1000mg  twice daily sent to pharmacy -Advised patient that if he continues to experience daytime fatigue, we can consider referring for sleep consult for possible underlying sleep disorder -Continue to stay active and maintain a healthy diet -Continue to monitor blood pressure at home -Maintain strict control of hypertension with blood pressure goal below 130/90, diabetes with hemoglobin A1c goal below 6.5% and cholesterol with LDL cholesterol (bad cholesterol) goal below 70 mg/dL. I also advised the patient to eat a healthy diet with plenty of whole grains, cereals, fruits and vegetables, exercise regularly and maintain ideal body weight.  Followup in the future with me in 6 months or call earlier if needed   Greater than 50% time during this 25 minute consultation visit was spent on counseling and coordination of care about his seizures, remote intracerebral hemorrhage and answering questions.   George Hugh, AGNP-BC  [facility] [address] [place], [place] [address]  Phone [phone] Fax [phone] Note: This document was prepared with digital dictation and possible smart phrase technology. Any transcriptional errors that result from this process are unintentional.

## 2018-01-15 NOTE — Patient Instructions (Signed)
Continue aspirin 81 mg daily  and lipitor  for secondary stroke prevention  Continue to follow up with PCP regarding cholesterol and blood pressure management  Due to day time fatigue, we will start extended release keppra. Continue current dose until you receive new dose in the mail. If you continue to experience day time fatigue, we will consider having you be seen by one of our sleep doctors for possible underlying sleep disorder   Continue to stay active and maintain a healthy diet  Continue to monitor blood pressure at home  Maintain strict control of hypertension with blood pressure goal below 130/90, diabetes with hemoglobin A1c goal below 6.5% and cholesterol with LDL cholesterol (bad cholesterol) goal below 70 mg/dL. I also advised the patient to eat a healthy diet with plenty of whole grains, cereals, fruits and vegetables, exercise regularly and maintain ideal body weight.  Followup in the future with me in 6 months or call earlier if needed       Thank you for coming to see us at [facility]. I hope we have been able to provide you high quality care today.  You may receive a patient satisfaction survey over the next few weeks. We would appreciate your feedback and comments so that we may continue to improve ourselves and the health of our patients.

## 2018-01-18 NOTE — Progress Notes (Signed)
I agree with the above plan 

## 2018-01-27 ENCOUNTER — Telehealth: Payer: Self-pay | Admitting: Adult Health

## 2018-01-27 NOTE — Telephone Encounter (Signed)
Revised. 

## 2018-01-27 NOTE — Telephone Encounter (Signed)
I call [facility] because last rx was sent to them. I spoke with the pharmacist to find out what questions they had about the medication. The pharmacist stated they receive a rx of keppra XR 24 hours of tablets twice a day.They stated if pt is on a 24 hour tablet its prescribed daily not twice a day. I stated a message will be sent to the provider. Ref number for call is [id number] when a call is return.

## 2018-01-27 NOTE — Telephone Encounter (Addendum)
I call [facility] pharmacy to find out a letter pt receive from [facility]. The staff stated pt was given a 3 month supply in 11/2017. He is not due for refill till 02/25/2018. Rn verbalized understanding.

## 2018-01-27 NOTE — Telephone Encounter (Signed)
Pts wife called stating that they have received a letter from [employer] stating that they have been trying to reach their provider to get more information on the pt so that he can receive his levETIRAcetam (KEPPRA XR) 500 MG 24 hr tablet Please advise

## 2018-01-28 MED ORDER — LEVETIRACETAM 500 MG PO TABS: 1000.0000 mg | ORAL_TABLET | Freq: Two times a day (BID) | ORAL | 11 refills | Status: DC

## 2018-01-28 NOTE — Addendum Note (Signed)
Addended by: George Hugh on: 01/28/2018 07:12 AM   Modules accepted: Orders

## 2018-01-28 NOTE — Telephone Encounter (Signed)
Please advise patient for the time being to continue on his prior prescription of Keppra IR 1000 mg twice daily for the time being.  Advised him that I will speak further to Dr. Pearlean Brownie about potential further treatment options or further work-up regarding daytime fatigue.

## 2018-01-29 ENCOUNTER — Other Ambulatory Visit: Payer: Self-pay | Admitting: Adult Health

## 2018-01-29 MED ORDER — LEVETIRACETAM 500 MG PO TABS: 500.0000 mg | ORAL_TABLET | Freq: Two times a day (BID) | ORAL | 4 refills | Status: DC

## 2018-01-29 NOTE — Progress Notes (Signed)
After review and discussion with  Dr. Pearlean Brownie, due to patient's continued complaints of excessive daytime fatigue, we will decrease dosage of Keppra from 1 g twice daily to 500 mg twice daily.  Please advise patient to notify us if he experiences any type of seizure activity.  If his fatigue is due to his Keppra, he should start to see improvement of his fatigue with decreased dose.  If he does continue to feel excessively fatigued, please notify him to follow-up with his PCP for further evaluation.

## 2018-01-29 NOTE — Telephone Encounter (Signed)
[name] Cruz, Jessica, NP 6 minutes ago (8:18 AM)      After review and discussion with  Dr. Pearlean BrownieSethi, due to patient's continued complaints of excessive daytime fatigue, we will decrease dosage of Keppra from 1 g twice daily to 500 mg twice daily.  Please advise patient to notify us if he experiences any type of seizure activity.  If his fatigue is due to his Keppra, he should start to see improvement of his fatigue with decreased dose.  If he does continue to feel excessively fatigued, please notify him to follow-up with his PCP for further evaluation.

## 2018-01-30 NOTE — Telephone Encounter (Signed)
I return pts wife call.I spoke with Steward Drone pts sister on dpr. Steward Drone stated her brother does not have a wife it was his mom that call. I stated the MD is changing his medication back to plain keppra one tablet in the am and one tablet in the pm. [facility]. Rn stated the new medication should improve his fatigue if not give [place] a call. I stated should continue to take his current rx until the new rx is mailed to him. I explain we are decreasing his dosge. Steward Drone verbalized understanding.

## 2018-02-03 ENCOUNTER — Telehealth: Payer: Self-pay | Admitting: Adult Health

## 2018-02-03 NOTE — Telephone Encounter (Signed)
Pts Sister ([place]) states that [facility] is not wanting to fill the Keppra RX until they speak to NP first. The Order #[id number] was given. Please advise.

## 2018-02-04 NOTE — Telephone Encounter (Signed)
I spoke with [facility] they stated the keppra medication was receive on 01/29/2018,and was mailed out to patient.The order number #[id number] is for the generic keppra 500mg  one tablet twice a day.

## 2018-02-04 NOTE — Telephone Encounter (Signed)
Revised. 

## 2018-02-04 NOTE — Telephone Encounter (Addendum)
PT sister Steward Drone return phone call. I explain that the order number she gave was for 500mg  generic keppra that was prescribed 01/29/2018 by Shanda Bumps NP. I explain to sister a call was made [facility] and they have the rx Shanda Bumps NP prescribed. I also explain per [facility] it should be deliver to their house or on route. Steward Drone stated pt receive a letter stating to call our office to refill medication. I stated to Steward Drone that it could of been a generated letter when the previous rx of keppra XR was discontinue. Steward Drone verbalized understanding.

## 2018-02-04 NOTE — Telephone Encounter (Signed)
I have already spoken to [facility] pharmacist last week in regards to Keppra.  Please call them to see what this is in regards to or if this is accurate as this information was provided by sister and not [facility].  Thank you.

## 2018-02-04 NOTE — Telephone Encounter (Signed)
Left vm for patients sister Steward Drone to call back about  keppra rx.

## 2018-02-05 ENCOUNTER — Ambulatory Visit: Payer: Medicare Other

## 2018-02-05 ENCOUNTER — Encounter: Payer: Medicare Other | Admitting: Family Medicine

## 2018-02-09 ENCOUNTER — Other Ambulatory Visit: Payer: Self-pay | Admitting: Family Medicine

## 2018-02-09 DIAGNOSIS — Z125 Encounter for screening for malignant neoplasm of prostate: Secondary | ICD-10-CM

## 2018-02-09 DIAGNOSIS — G40909 Epilepsy, unspecified, not intractable, without status epilepticus: Secondary | ICD-10-CM

## 2018-02-09 DIAGNOSIS — E782 Mixed hyperlipidemia: Secondary | ICD-10-CM

## 2018-02-09 DIAGNOSIS — I10 Essential (primary) hypertension: Secondary | ICD-10-CM

## 2018-02-10 ENCOUNTER — Encounter: Payer: Self-pay | Admitting: Family Medicine

## 2018-02-10 ENCOUNTER — Encounter: Payer: Medicare Other | Admitting: Family Medicine

## 2018-02-10 ENCOUNTER — Ambulatory Visit: Payer: Medicare Other

## 2018-02-10 NOTE — Progress Notes (Deleted)
There were no vitals taken for this visit.   CC: CPE Subjective:    Patient ID: [name] Cruz, male    DOB: Apr 05, 1955, 63 y.o.   MRN: [id number]  HPI: [name] Cruz is a 63 y.o. male presenting on 02/10/2018 for No chief complaint on file.   Did not see Virl Axe this year for medicare wellness visit. Note reviewed.    Mother Dot drove him here - patient does not drive.   H/o hemorrhagic stroke 2007 from uncontrolled hypertension with residual cognitive impairment from encephalomalacia and resultant complex partial seizures. S/p 3v CABG 2019 then ischemic stroke late 2019 as well as acute UE DVT 08/2017 treated with eliquis. Saw neurology 01/2018 - keppra changed to XR formulation (1000mg  XR BID).   Hearing screen - passed Vision screen -failed on left (last year failed on right) h/o R vision trouble.  Fall risk screen - passed Depression screen - passed  Preventative: Colon cancer screening -iFOB yearly Prostate cancer screening -Q2 yrs - check today Flu shotyearly Td - 2014  Pneumovax - 2014 shingrix - discussed, declines  Advanced directive received and scanned 07/2016. Sister Lynnae Prude is general POA. Discussed with patient - ok with CPR but doesn't want intubation or feeding tube.  Seat belt use discussed Sunscreen use and skin screen discussed No smoking.  Alcohol - *** 4-6 beers a day when he can afford it.   Caffeine: 1 pot coffee Lives with oldest sister Occupation: disabled, was Corporate investment banker Does not drive. Activity: no regular exercise Diet: good water, drinks V8 juice     Relevant past medical, surgical, family and social history reviewed and updated as indicated. Interim medical history since our last visit reviewed. Allergies and medications reviewed and updated. Outpatient Medications Prior to Visit  Medication Sig Dispense Refill  . aspirin 81 MG EC tablet Take 1 tablet (81 mg total) by mouth daily. 90 tablet 0  . atorvastatin (LIPITOR) 80  MG tablet TAKE 1 TABLET (80 MG TOTAL) BY MOUTH DAILY. FOR CHOLESTEROL 90 tablet 3  . carvedilol (COREG) 3.125 MG tablet Take 1 tablet (3.125 mg total) by mouth 2 (two) times daily. 180 tablet 3  . Cyanocobalamin (B-12) 1000 MCG SUBL Place 1 tablet under the tongue daily. 90 each 0  . levETIRAcetam (KEPPRA) 500 MG tablet Take 1 tablet (500 mg total) by mouth 2 (two) times daily. 60 tablet 4  . Multiple Vitamin (MULTIVITAMIN WITH MINERALS) TABS tablet Take 1 tablet by mouth daily. 90 tablet 0  . sertraline (ZOLOFT) 25 MG tablet Take 0.5 tablets (12.5 mg total) by mouth at bedtime. 45 tablet 3   No facility-administered medications prior to visit.      Per HPI unless specifically indicated in ROS section below Review of Systems  Constitutional: Negative for activity change, appetite change, chills, fatigue, fever and unexpected weight change.  HENT: Negative for hearing loss.   Eyes: Negative for visual disturbance.  Respiratory: Negative for cough, chest tightness, shortness of breath and wheezing.   Cardiovascular: Negative for chest pain, palpitations and leg swelling.  Gastrointestinal: Negative for abdominal distention, abdominal pain, blood in stool, constipation, diarrhea, nausea and vomiting.  Genitourinary: Negative for difficulty urinating and hematuria.  Musculoskeletal: Negative for arthralgias, myalgias and neck pain.  Skin: Negative for rash.  Neurological: Negative for dizziness, seizures, syncope and headaches.  Hematological: Negative for adenopathy. Does not bruise/bleed easily.  Psychiatric/Behavioral: Negative for dysphoric mood. The patient is not nervous/anxious.    Objective:  There were no vitals taken for this visit.  Wt Readings from Last 3 Encounters:  01/15/18 161 lb (73 kg)  12/02/17 156 lb (70.8 kg)  11/24/17 152 lb 12 oz (69.3 kg)    Physical Exam Vitals signs and nursing note reviewed.  Constitutional:      General: He is not in acute distress.     Appearance: He is well-developed.  HENT:     Head: Normocephalic and atraumatic.     Right Ear: Hearing, tympanic membrane, ear canal and external ear normal.     Left Ear: Hearing, tympanic membrane, ear canal and external ear normal.     Nose: Nose normal.     Mouth/Throat:     Pharynx: Uvula midline. No oropharyngeal exudate or posterior oropharyngeal erythema.  Eyes:     General: No scleral icterus.    Conjunctiva/sclera: Conjunctivae normal.     Pupils: Pupils are equal, round, and reactive to light.  Neck:     Musculoskeletal: Normal range of motion and neck supple.  Cardiovascular:     Rate and Rhythm: Normal rate and regular rhythm.     Pulses:          Radial pulses are 2+ on the right side and 2+ on the left side.     Heart sounds: Normal heart sounds. No murmur.  Pulmonary:     Effort: Pulmonary effort is normal. No respiratory distress.     Breath sounds: Normal breath sounds. No wheezing or rales.  Abdominal:     General: Bowel sounds are normal. There is no distension.     Palpations: Abdomen is soft. There is no mass.     Tenderness: There is no abdominal tenderness. There is no guarding or rebound.  Musculoskeletal: Normal range of motion.  Lymphadenopathy:     Cervical: No cervical adenopathy.  Skin:    General: Skin is warm and dry.     Findings: No rash.  Neurological:     Mental Status: He is alert and oriented to person, place, and time.     Comments: CN grossly intact, station and gait intact  Psychiatric:        Behavior: Behavior normal.        Thought Content: Thought content normal.        Judgment: Judgment normal.       Results for orders placed or performed in visit on 12/02/17  Basic metabolic panel  Result Value Ref Range   Glucose 85 65 - 99 mg/dL   BUN 15 8 - 27 mg/dL   Creatinine, Ser 3.38 0.76 - 1.27 mg/dL   GFR calc non Af Amer 97 >59 mL/min/1.73   GFR calc Af Amer 112 >59 mL/min/1.73   BUN/Creatinine Ratio 19 10 - 24   Sodium 139  134 - 144 mmol/L   Potassium 5.3 (H) 3.5 - 5.2 mmol/L   Chloride 99 96 - 106 mmol/L   CO2 25 20 - 29 mmol/L   Calcium 9.6 8.6 - 10.2 mg/dL   Assessment & Plan:   Problem List Items Addressed This Visit    None       No orders of the defined types were placed in this encounter.  No orders of the defined types were placed in this encounter.   Follow up plan: No follow-ups on file.  [name], MD

## 2018-03-08 ENCOUNTER — Encounter (HOSPITAL_COMMUNITY): Payer: Self-pay | Admitting: Emergency Medicine

## 2018-03-08 ENCOUNTER — Emergency Department (HOSPITAL_COMMUNITY): Payer: Medicare Other

## 2018-03-08 ENCOUNTER — Other Ambulatory Visit: Payer: Self-pay

## 2018-03-08 ENCOUNTER — Emergency Department (HOSPITAL_COMMUNITY)
Admission: EM | Admit: 2018-03-08 | Discharge: 2018-03-08 | Disposition: A | Discharge status: Home / Self Care | Payer: Medicare Other | Attending: Emergency Medicine | Admitting: Emergency Medicine

## 2018-03-08 DIAGNOSIS — G4710 Hypersomnia, unspecified: Secondary | ICD-10-CM

## 2018-03-08 DIAGNOSIS — I252 Old myocardial infarction: Secondary | ICD-10-CM | POA: Insufficient documentation

## 2018-03-08 DIAGNOSIS — Z951 Presence of aortocoronary bypass graft: Secondary | ICD-10-CM | POA: Diagnosis not present

## 2018-03-08 DIAGNOSIS — I10 Essential (primary) hypertension: Secondary | ICD-10-CM | POA: Diagnosis not present

## 2018-03-08 DIAGNOSIS — Z79899 Other long term (current) drug therapy: Secondary | ICD-10-CM | POA: Insufficient documentation

## 2018-03-08 DIAGNOSIS — R5383 Other fatigue: Secondary | ICD-10-CM | POA: Diagnosis not present

## 2018-03-08 DIAGNOSIS — Z7982 Long term (current) use of aspirin: Secondary | ICD-10-CM | POA: Diagnosis not present

## 2018-03-08 DIAGNOSIS — R072 Precordial pain: Secondary | ICD-10-CM | POA: Diagnosis not present

## 2018-03-08 DIAGNOSIS — R079 Chest pain, unspecified: Secondary | ICD-10-CM | POA: Diagnosis not present

## 2018-03-08 LAB — BASIC METABOLIC PANEL
Anion gap: 11 (ref 5–15)
BUN: 14 mg/dL (ref 8–23)
CALCIUM: 9.4 mg/dL (ref 8.9–10.3)
CO2: 26 mmol/L (ref 22–32)
Chloride: 100 mmol/L (ref 98–111)
Creatinine, Ser: 0.85 mg/dL (ref 0.61–1.24)
GFR calc Af Amer: >60 mL/min (ref >60)
GFR calc non Af Amer: >60 mL/min (ref >60)
GLUCOSE: 78 mg/dL (ref 70–99)
POTASSIUM: 4.8 mmol/L (ref 3.5–5.1)
Sodium: 137 mmol/L (ref 135–145)

## 2018-03-08 LAB — CBC
HEMATOCRIT: 45.9 % (ref 39.0–52.0)
Hemoglobin: 14.2 g/dL (ref 13.0–17.0)
MCH: 29.5 pg (ref 26.0–34.0)
MCHC: 30.9 g/dL (ref 30.0–36.0)
MCV: 95.2 fL (ref 80.0–100.0)
NRBC: 0 % (ref 0.0–0.2)
PLATELETS: 214 10*3/uL (ref 150–400)
RBC: 4.82 MIL/uL (ref 4.22–5.81)
RDW: 12.3 % (ref 11.5–15.5)
WBC: 7.9 10*3/uL (ref 4.0–10.5)

## 2018-03-08 LAB — ETHANOL: Alcohol, Ethyl (B): <10 mg/dL (ref <10)

## 2018-03-08 LAB — TROPONIN I: Troponin I: <0.03 ng/mL (ref <0.03)

## 2018-03-08 NOTE — ED Triage Notes (Signed)
Pt in from home with on and off cp x few wks per family, also states he has been "sleeping a lot" x 1 mo. Denies any cp or sob today, does have hx of MI, CABG and seizures.

## 2018-03-08 NOTE — ED Notes (Signed)
Patient verbalizes understanding of discharge instructions. Opportunity for questioning and answers were provided. Armband removed by staff, pt discharged from ED.  

## 2018-03-08 NOTE — ED Notes (Signed)
Phlebotomist is attempting blood draw at this time.

## 2018-03-08 NOTE — ED Notes (Signed)
Patient transported to X-ray 

## 2018-03-08 NOTE — ED Notes (Signed)
3 unsuccessful IV attempts for this and another RN. Unable to collect light green tube.

## 2018-03-08 NOTE — ED Notes (Signed)
IV attempted without success. 

## 2018-03-08 NOTE — ED Provider Notes (Signed)
[facility] EMERGENCY DEPARTMENT Provider Note   CSN: [id number] Arrival date & time: 03/08/18  1112    History   Chief Complaint Chief Complaint  Patient presents with  . Chest Pain  . Fatigue    HPI [name] Cruz is a 63 y.o. male.     HPI Patient is a 63 year old male with a history of cognitive impairment noted prior stroke who presents the emergency department with his family member for increased excessive sleepiness over the past month.  Family reports that he will get up and do his normal activities and will go back to bed and sleep again.  This been going on for some period of time.  He also reported some chest discomfort this morning that lasted for an hour or 2.  He does have a known history of coronary artery bypass grafting.  Patient has no complaints of pain at this time.  He moves all 4 extremities equally.  Family reports baseline mental status currently.  Family would like him evaluated in the emergency department for his sleepiness.   Past Medical History:  Diagnosis Date  . Cognitive impairment 2007   after stroke, saw rehab but told to stop because was too upsetting to him  . History of chicken pox   . HLD (hyperlipidemia)   . HTN (hypertension)   . NSTEMI (non-ST elevated myocardial infarction) (HCC) 02/21/2017  . Obesity   . Stroke, hemorrhagic (HCC) 2007   thought 2/2 HTN (240sbp); residual cognitive impairment, loss of R peripheral field, no driving    Patient Active Problem List   Diagnosis Date Noted  . Hyperkalemia 10/03/2017  . Unilateral occipital headache 10/03/2017  . Ischemic stroke (HCC) 08/23/2017  . Coronary artery disease involving coronary bypass graft of native heart without angina pectoris   . Atrial fibrillation (HCC)   . Acute deep vein thrombosis (DVT) of left upper extremity (HCC)   . Dysphagia, post-stroke   . Cardiac arrest (HCC) 08/09/2017  . S/P CABG x 3 02/24/2017  . NSTEMI (non-ST elevated myocardial  infarction) (HCC) 02/21/2017  . Encephalomalacia 10/28/2012  . Seizure disorder (HCC) 04/26/2012  . Alcohol abuse 04/26/2012  . History of stroke with current residual effects   . Cognitive impairment   . HLD (hyperlipidemia)   . HTN (hypertension)     Past Surgical History:  Procedure Laterality Date  . ANKLE SURGERY  1990s   right foot with plate and screws  . CORONARY ARTERY BYPASS GRAFT N/A 02/24/2017   3v Procedure: CORONARY ARTERY BYPASS GRAFTING (CABG) x 3 ON PUMP USING LEFT INTERNAL MAMMARY ARTERY TO LEFT ANTERIOR DESENDING CORNARY ARTERY, RIGHT GREATER SAPHENOUS VEIN TO LEFT CIRCUMFLEX ARTERY AND POSTERIOR DESENDING ARTERY. RIGHT GREATER SAPHENOUS VEIN OBTAINED VIA ENDOVEIN HARVEST.;  Surgeon: Delight Ovens, MD  . IABP INSERTION N/A 02/21/2017   Procedure: IABP Insertion;  Surgeon: Yvonne Kendall, MD;  Location: [facility] INVASIVE CV LAB;  Service: Cardiovascular;  Laterality: N/A;  . LEFT HEART CATH AND CORONARY ANGIOGRAPHY N/A 02/21/2017   Procedure: LEFT HEART CATH AND CORONARY ANGIOGRAPHY;  Surgeon: Yvonne Kendall, MD;  Location: [facility] INVASIVE CV LAB;  Service: Cardiovascular;  Laterality: N/A;  . TEE WITHOUT CARDIOVERSION N/A 02/24/2017   Procedure: TRANSESOPHAGEAL ECHOCARDIOGRAM (TEE);  Surgeon: Delight Ovens, MD;  Location: [facility];  Service: Open Heart Surgery;  Laterality: N/A;        Home Medications    Prior to Admission medications   Medication Sig Start Date End Date Taking? Authorizing  Provider  aspirin 81 MG EC tablet Take 1 tablet (81 mg total) by mouth daily. 12/26/17  Yes [name], MD  atorvastatin (LIPITOR) 80 MG tablet TAKE 1 TABLET (80 MG TOTAL) BY MOUTH DAILY. FOR CHOLESTEROL Patient taking differently: Take 80 mg by mouth daily.  12/09/17  Yes Corky Crafts, MD  carvedilol (COREG) 3.125 MG tablet Take 1 tablet (3.125 mg total) by mouth 2 (two) times daily. 12/09/17  Yes Corky Crafts, MD  Cyanocobalamin (B-12) 1000 MCG SUBL Place  1 tablet under the tongue daily. 12/26/17  Yes [name], MD  levETIRAcetam (KEPPRA) 500 MG tablet Take 1 tablet (500 mg total) by mouth 2 (two) times daily. 01/29/18  Yes George Hugh, NP  Multiple Vitamin (MULTIVITAMIN WITH MINERALS) TABS tablet Take 1 tablet by mouth daily. 12/26/17  Yes [name], MD  sertraline (ZOLOFT) 25 MG tablet Take 0.5 tablets (12.5 mg total) by mouth at bedtime. 12/16/17  Yes [name], MD    Family History Family History  Problem Relation Age of Onset  . Alzheimer's disease Maternal Grandfather   . Cancer Mother        lymphoma  . Alcohol abuse Father        smoker  . Coronary artery disease Neg Hx   . Stroke Neg Hx   . Diabetes Neg Hx     Social History Social History   Tobacco Use  . Smoking status: Never Smoker  . Smokeless tobacco: Former Engineer, water Use Topics  . Alcohol use: Not Currently    Alcohol/week: 14.0 standard drinks    Types: 12 Cans of beer, 2 Shots of liquor per week  . Drug use: No     Allergies   Losartan   Review of Systems Review of Systems  All other systems reviewed and are negative.    Physical Exam Updated Vital Signs BP 136/78   Pulse (!) 56   Temp 98.3 F (36.8 C) (Oral)   Resp 14   Ht  (1.6 m)   Wt 71.2 kg   SpO2 100%   BMI 27.81 kg/m   Physical Exam Vitals signs and nursing note reviewed.  Constitutional:      Appearance: He is well-developed.  HENT:     Head: Normocephalic and atraumatic.  Neck:     Musculoskeletal: Normal range of motion.  Cardiovascular:     Rate and Rhythm: Normal rate and regular rhythm.     Heart sounds: Normal heart sounds.  Pulmonary:     Effort: Pulmonary effort is normal. No respiratory distress.     Breath sounds: Normal breath sounds.  Abdominal:     General: There is no distension.     Palpations: Abdomen is soft.     Tenderness: There is no abdominal tenderness.  Musculoskeletal: Normal range of motion.  Skin:     General: Skin is warm and dry.  Neurological:     Mental Status: He is alert and oriented to person, place, and time.  Psychiatric:        Judgment: Judgment normal.      ED Treatments / Results  Labs (all labs ordered are listed, but only abnormal results are displayed) Labs Reviewed  CBC  BASIC METABOLIC PANEL  TROPONIN I  ETHANOL    EKG EKG Interpretation  Date/Time:  Sunday March 08 2018 11:44:55 EST Ventricular Rate:  62 PR Interval:    QRS Duration: 78 QT Interval:  378 QTC Calculation: 384 R Axis:   -  21 Text Interpretation:  Sinus rhythm Ventricular premature complex Borderline left axis deviation Low voltage, precordial leads Consider anterior infarct No significant change was found Confirmed by Azalia Bilis [id number]) on 03/08/2018 2:33:28 PM   Radiology Dg Chest 2 View  Result Date: 03/08/2018 CLINICAL DATA:  Chest pain. Sleepiness. EXAM: CHEST - 2 VIEW COMPARISON:  08/15/2017 FINDINGS: Postsurgical changes from CABG. Cardiomediastinal silhouette is normal. Mediastinal contours appear intact. There is no evidence of focal airspace consolidation, pleural effusion or pneumothorax. Osseous structures are without acute abnormality. Soft tissues are grossly normal. IMPRESSION: No active cardiopulmonary disease. Electronically Signed   By: Ted Mcalpine M.D.   On: 03/08/2018 14:22   Ct Head Wo Contrast  Result Date: 03/08/2018 CLINICAL DATA:  Mental status changes. EXAM: CT HEAD WITHOUT CONTRAST TECHNIQUE: Contiguous axial images were obtained from the base of the skull through the vertex without intravenous contrast. COMPARISON:  10/01/2017 FINDINGS: Brain: There is no evidence for acute hemorrhage, hydrocephalus, mass lesion, or abnormal extra-axial fluid collection. No definite CT evidence for acute infarction. Left temporoparietal encephalomalacia with ex vacuo dilatation of the posterior and temporal horns left lateral ventricle stable in the interval. Diffuse  loss of parenchymal volume is consistent with atrophy. Patchy low attenuation in the deep hemispheric and periventricular white matter is nonspecific, but likely reflects chronic microvascular ischemic demyelination. Vascular: No hyperdense vessel or unexpected calcification. Skull: No evidence for fracture. No worrisome lytic or sclerotic lesion. Sinuses/Orbits: The visualized paranasal sinuses and mastoid air cells are clear. Visualized portions of the globes and intraorbital fat are unremarkable. Other: None. IMPRESSION: 1. Stable. No acute intracranial abnormality. 2. Old left temporoparietal infarct. 3. Atrophy with chronic small vessel white matter ischemic disease. Electronically Signed   By: Kennith Center M.D.   On: 03/08/2018 13:45    Procedures Procedures (including critical care time)  Medications Ordered in ED Medications - No data to display   Initial Impression / Assessment and Plan / ED Course  I have reviewed the triage vital signs and the nursing notes.  Pertinent labs & imaging results that were available during my care of the patient were reviewed by me and considered in my medical decision making (see chart for details).       Patient is well-appearing at this time.  He is stayed alert and awake the entire time while in the emergency department.  He is not difficult to arouse.  He is able to hold a somewhat normal conversation despite his cognitive impairment.  He has no complaints of chest pain at this time.  His work-up in the emergency department including labs, chest x-ray, troponin, EKG, his head CT demonstrated no acute abnormality.  At this point I do not think there is additional testing necessary in the emergency department.  He does not need admission the hospital.  Patient is stable for discharge to follow-up with his primary care physician.   Final Clinical Impressions(s) / ED Diagnoses   Final diagnoses:  Precordial chest pain  Sleeping excessive    ED  Discharge Orders    None       Azalia Bilis, MD 03/08/18 1520

## 2018-03-08 NOTE — ED Notes (Signed)
Dr Patria Mane notified of difficulty with obtaining blood. He offered to try when pt returns from xray.

## 2018-03-25 ENCOUNTER — Encounter (HOSPITAL_COMMUNITY): Payer: Self-pay | Admitting: Internal Medicine

## 2018-03-25 ENCOUNTER — Inpatient Hospital Stay (HOSPITAL_COMMUNITY)
Admission: EM | Admit: 2018-03-25 | Discharge: 2018-03-29 | DRG: 100 | Disposition: A | Discharge status: Home Health | Payer: Medicare Other | Attending: Family Medicine | Admitting: Family Medicine

## 2018-03-25 ENCOUNTER — Other Ambulatory Visit: Payer: Self-pay

## 2018-03-25 ENCOUNTER — Emergency Department (HOSPITAL_COMMUNITY): Payer: Medicare Other

## 2018-03-25 DIAGNOSIS — I2510 Atherosclerotic heart disease of native coronary artery without angina pectoris: Secondary | ICD-10-CM | POA: Diagnosis not present

## 2018-03-25 DIAGNOSIS — E785 Hyperlipidemia, unspecified: Secondary | ICD-10-CM | POA: Diagnosis present

## 2018-03-25 DIAGNOSIS — I69290 Apraxia following other nontraumatic intracranial hemorrhage: Secondary | ICD-10-CM

## 2018-03-25 DIAGNOSIS — I361 Nonrheumatic tricuspid (valve) insufficiency: Secondary | ICD-10-CM | POA: Diagnosis not present

## 2018-03-25 DIAGNOSIS — R000 Tachycardia, unspecified: Secondary | ICD-10-CM | POA: Diagnosis not present

## 2018-03-25 DIAGNOSIS — Z820 Family history of epilepsy and other diseases of the nervous system: Secondary | ICD-10-CM | POA: Diagnosis not present

## 2018-03-25 DIAGNOSIS — G40901 Epilepsy, unspecified, not intractable, with status epilepticus: Secondary | ICD-10-CM | POA: Diagnosis present

## 2018-03-25 DIAGNOSIS — R404 Transient alteration of awareness: Secondary | ICD-10-CM | POA: Diagnosis not present

## 2018-03-25 DIAGNOSIS — Z832 Family history of diseases of the blood and blood-forming organs and certain disorders involving the immune mechanism: Secondary | ICD-10-CM

## 2018-03-25 DIAGNOSIS — I480 Paroxysmal atrial fibrillation: Secondary | ICD-10-CM | POA: Diagnosis not present

## 2018-03-25 DIAGNOSIS — N179 Acute kidney failure, unspecified: Secondary | ICD-10-CM | POA: Diagnosis not present

## 2018-03-25 DIAGNOSIS — R0689 Other abnormalities of breathing: Secondary | ICD-10-CM | POA: Diagnosis not present

## 2018-03-25 DIAGNOSIS — Z6832 Body mass index (BMI) 32.0-32.9, adult: Secondary | ICD-10-CM

## 2018-03-25 DIAGNOSIS — I252 Old myocardial infarction: Secondary | ICD-10-CM | POA: Diagnosis not present

## 2018-03-25 DIAGNOSIS — Z888 Allergy status to other drugs, medicaments and biological substances status: Secondary | ICD-10-CM | POA: Diagnosis not present

## 2018-03-25 DIAGNOSIS — R569 Unspecified convulsions: Secondary | ICD-10-CM | POA: Diagnosis not present

## 2018-03-25 DIAGNOSIS — Z7982 Long term (current) use of aspirin: Secondary | ICD-10-CM

## 2018-03-25 DIAGNOSIS — Z811 Family history of alcohol abuse and dependence: Secondary | ICD-10-CM

## 2018-03-25 DIAGNOSIS — I10 Essential (primary) hypertension: Secondary | ICD-10-CM | POA: Diagnosis present

## 2018-03-25 DIAGNOSIS — R739 Hyperglycemia, unspecified: Secondary | ICD-10-CM | POA: Diagnosis present

## 2018-03-25 DIAGNOSIS — J189 Pneumonia, unspecified organism: Secondary | ICD-10-CM | POA: Diagnosis present

## 2018-03-25 DIAGNOSIS — R29810 Facial weakness: Secondary | ICD-10-CM | POA: Diagnosis present

## 2018-03-25 DIAGNOSIS — G40401 Other generalized epilepsy and epileptic syndromes, not intractable, with status epilepticus: Principal | ICD-10-CM | POA: Diagnosis present

## 2018-03-25 DIAGNOSIS — I69219 Unspecified symptoms and signs involving cognitive functions following other nontraumatic intracranial hemorrhage: Secondary | ICD-10-CM | POA: Diagnosis not present

## 2018-03-25 DIAGNOSIS — E872 Acidosis: Secondary | ICD-10-CM | POA: Diagnosis not present

## 2018-03-25 DIAGNOSIS — I248 Other forms of acute ischemic heart disease: Secondary | ICD-10-CM | POA: Diagnosis present

## 2018-03-25 DIAGNOSIS — R079 Chest pain, unspecified: Secondary | ICD-10-CM | POA: Diagnosis not present

## 2018-03-25 DIAGNOSIS — Z86718 Personal history of other venous thrombosis and embolism: Secondary | ICD-10-CM | POA: Diagnosis not present

## 2018-03-25 DIAGNOSIS — Z951 Presence of aortocoronary bypass graft: Secondary | ICD-10-CM | POA: Diagnosis not present

## 2018-03-25 DIAGNOSIS — I4891 Unspecified atrial fibrillation: Secondary | ICD-10-CM | POA: Diagnosis present

## 2018-03-25 DIAGNOSIS — I272 Pulmonary hypertension, unspecified: Secondary | ICD-10-CM | POA: Diagnosis present

## 2018-03-25 DIAGNOSIS — Z79899 Other long term (current) drug therapy: Secondary | ICD-10-CM | POA: Diagnosis not present

## 2018-03-25 DIAGNOSIS — I69298 Other sequelae of other nontraumatic intracranial hemorrhage: Secondary | ICD-10-CM

## 2018-03-25 DIAGNOSIS — D509 Iron deficiency anemia, unspecified: Secondary | ICD-10-CM | POA: Diagnosis present

## 2018-03-25 DIAGNOSIS — E669 Obesity, unspecified: Secondary | ICD-10-CM | POA: Diagnosis present

## 2018-03-25 DIAGNOSIS — R918 Other nonspecific abnormal finding of lung field: Secondary | ICD-10-CM | POA: Diagnosis not present

## 2018-03-25 DIAGNOSIS — H538 Other visual disturbances: Secondary | ICD-10-CM | POA: Diagnosis present

## 2018-03-25 DIAGNOSIS — R4020 Unspecified coma: Secondary | ICD-10-CM | POA: Diagnosis not present

## 2018-03-25 DIAGNOSIS — S0181XA Laceration without foreign body of other part of head, initial encounter: Secondary | ICD-10-CM | POA: Diagnosis present

## 2018-03-25 DIAGNOSIS — X58XXXA Exposure to other specified factors, initial encounter: Secondary | ICD-10-CM | POA: Diagnosis present

## 2018-03-25 LAB — URINALYSIS, ROUTINE W REFLEX MICROSCOPIC
Bilirubin Urine: NEGATIVE
GLUCOSE, UA: NEGATIVE mg/dL
Hgb urine dipstick: NEGATIVE
Ketones, ur: 5 mg/dL — AB
Leukocytes,Ua: NEGATIVE
Nitrite: NEGATIVE
Protein, ur: NEGATIVE mg/dL
Specific Gravity, Urine: 1.011 (ref 1.005–1.030)
pH: 5 (ref 5.0–8.0)

## 2018-03-25 LAB — COMPREHENSIVE METABOLIC PANEL
ALT: 20 U/L (ref 0–44)
AST: 28 U/L (ref 15–41)
Albumin: 3.9 g/dL (ref 3.5–5.0)
Alkaline Phosphatase: 71 U/L (ref 38–126)
Anion gap: 16 — ABNORMAL HIGH (ref 5–15)
BUN: 10 mg/dL (ref 8–23)
CO2: 15 mmol/L — ABNORMAL LOW (ref 22–32)
Calcium: 8.8 mg/dL — ABNORMAL LOW (ref 8.9–10.3)
Chloride: 104 mmol/L (ref 98–111)
Creatinine, Ser: 1.17 mg/dL (ref 0.61–1.24)
GFR calc Af Amer: >60 mL/min (ref >60)
GFR calc non Af Amer: >60 mL/min (ref >60)
Glucose, Bld: 214 mg/dL — ABNORMAL HIGH (ref 70–99)
POTASSIUM: 3.6 mmol/L (ref 3.5–5.1)
Sodium: 135 mmol/L (ref 135–145)
Total Bilirubin: 0.5 mg/dL (ref 0.3–1.2)
Total Protein: 6.7 g/dL (ref 6.5–8.1)

## 2018-03-25 LAB — POCT I-STAT 7, (LYTES, BLD GAS, ICA,H+H)
ACID-BASE DEFICIT: 8 mmol/L — AB (ref 0.0–2.0)
Bicarbonate: 19.2 mmol/L — ABNORMAL LOW (ref 20.0–28.0)
CALCIUM ION: 1.22 mmol/L (ref 1.15–1.40)
HCT: 33 % — ABNORMAL LOW (ref 39.0–52.0)
Hemoglobin: 11.2 g/dL — ABNORMAL LOW (ref 13.0–17.0)
O2 SAT: 98 %
Patient temperature: 98.6
Potassium: 3.6 mmol/L (ref 3.5–5.1)
Sodium: 136 mmol/L (ref 135–145)
TCO2: 20 mmol/L — ABNORMAL LOW (ref 22–32)
pCO2 arterial: 43.1 mmHg (ref 32.0–48.0)
pH, Arterial: 7.257 — ABNORMAL LOW (ref 7.350–7.450)
pO2, Arterial: 117 mmHg — ABNORMAL HIGH (ref 83.0–108.0)

## 2018-03-25 LAB — CBC WITH DIFFERENTIAL/PLATELET
Abs Immature Granulocytes: 0.07 10*3/uL (ref 0.00–0.07)
Basophils Absolute: 0 10*3/uL (ref 0.0–0.1)
Basophils Relative: 0 %
Eosinophils Absolute: 0 10*3/uL (ref 0.0–0.5)
Eosinophils Relative: 0 %
HEMATOCRIT: 37.9 % — AB (ref 39.0–52.0)
Hemoglobin: 12.1 g/dL — ABNORMAL LOW (ref 13.0–17.0)
Immature Granulocytes: 1 %
LYMPHS ABS: 1.2 10*3/uL (ref 0.7–4.0)
Lymphocytes Relative: 11 %
MCH: 30.3 pg (ref 26.0–34.0)
MCHC: 31.9 g/dL (ref 30.0–36.0)
MCV: 95 fL (ref 80.0–100.0)
MONOS PCT: 5 %
Monocytes Absolute: 0.5 10*3/uL (ref 0.1–1.0)
Neutro Abs: 8.5 10*3/uL — ABNORMAL HIGH (ref 1.7–7.7)
Neutrophils Relative %: 83 %
Platelets: 208 10*3/uL (ref 150–400)
RBC: 3.99 MIL/uL — ABNORMAL LOW (ref 4.22–5.81)
RDW: 11.9 % (ref 11.5–15.5)
WBC: 10.3 10*3/uL (ref 4.0–10.5)
nRBC: 0 % (ref 0.0–0.2)

## 2018-03-25 LAB — I-STAT TROPONIN, ED: Troponin i, poc: 0 ng/mL (ref 0.00–0.08)

## 2018-03-25 LAB — RAPID URINE DRUG SCREEN, HOSP PERFORMED
Amphetamines: NOT DETECTED
Barbiturates: NOT DETECTED
Benzodiazepines: POSITIVE — AB
Cocaine: NOT DETECTED
Opiates: NOT DETECTED
Tetrahydrocannabinol: NOT DETECTED

## 2018-03-25 LAB — CBG MONITORING, ED: Glucose-Capillary: 187 mg/dL — ABNORMAL HIGH (ref 70–99)

## 2018-03-25 LAB — TROPONIN I: Troponin I: 0.58 ng/mL (ref <0.03)

## 2018-03-25 LAB — MAGNESIUM: Magnesium: 1.9 mg/dL (ref 1.7–2.4)

## 2018-03-25 LAB — ETHANOL: Alcohol, Ethyl (B): <10 mg/dL (ref <10)

## 2018-03-25 LAB — TSH: TSH: 0.576 u[IU]/mL (ref 0.350–4.500)

## 2018-03-25 MED ORDER — LEVETIRACETAM IN NACL 1000 MG/100ML IV SOLN
1000.0000 mg | Freq: Two times a day (BID) | INTRAVENOUS | Status: DC
  Administered 2018-03-26 – 2018-03-27 (×3): 1000 mg via INTRAVENOUS
  Filled 2018-03-25 (×3): qty 100

## 2018-03-25 MED ORDER — ACETAMINOPHEN 650 MG RE SUPP: 650.0000 mg | Freq: Four times a day (QID) | RECTAL | Status: DC | PRN

## 2018-03-25 MED ORDER — PIPERACILLIN-TAZOBACTAM 3.375 G IVPB
3.3750 g | Freq: Three times a day (TID) | INTRAVENOUS | Status: DC
  Administered 2018-03-26 (×2): 3.375 g via INTRAVENOUS
  Filled 2018-03-25 (×5): qty 50

## 2018-03-25 MED ORDER — LEVETIRACETAM 750 MG PO TABS: 750.0000 mg | ORAL_TABLET | Freq: Two times a day (BID) | ORAL | 0 refills | Status: DC

## 2018-03-25 MED ORDER — PIPERACILLIN-TAZOBACTAM 3.375 G IVPB 30 MIN
3.3750 g | Freq: Once | INTRAVENOUS | Status: AC
  Administered 2018-03-26: 3.375 g via INTRAVENOUS

## 2018-03-25 MED ORDER — SODIUM CHLORIDE 0.9 % IV SOLN
INTRAVENOUS | Status: AC
  Administered 2018-03-25 – 2018-03-26 (×2): via INTRAVENOUS

## 2018-03-25 MED ORDER — ACETAMINOPHEN 325 MG PO TABS: 650.0000 mg | ORAL_TABLET | Freq: Four times a day (QID) | ORAL | Status: DC | PRN

## 2018-03-25 MED ORDER — LEVETIRACETAM IN NACL 1000 MG/100ML IV SOLN
1000.0000 mg | Freq: Once | INTRAVENOUS | Status: AC
  Administered 2018-03-25: 1000 mg via INTRAVENOUS
  Filled 2018-03-25: qty 100

## 2018-03-25 MED ORDER — ONDANSETRON HCL 4 MG/2ML IJ SOLN
4.0000 mg | Freq: Once | INTRAMUSCULAR | Status: AC
  Administered 2018-03-25: 4 mg via INTRAVENOUS
  Filled 2018-03-25: qty 2

## 2018-03-25 MED ORDER — SODIUM CHLORIDE 0.9 % IV SOLN
20.0000 mg/kg | Freq: Once | INTRAVENOUS | Status: AC
  Administered 2018-03-25: 1424 mg via INTRAVENOUS
  Filled 2018-03-25: qty 28.48

## 2018-03-25 MED ORDER — SODIUM CHLORIDE 0.9 % IV BOLUS
1000.0000 mL | Freq: Once | INTRAVENOUS | Status: AC
  Administered 2018-03-25: 1000 mL via INTRAVENOUS

## 2018-03-25 NOTE — ED Provider Notes (Signed)
[facility] EMERGENCY DEPARTMENT Provider Note   CSN: [id number] Arrival date & time: 03/25/18  1453    History   Chief Complaint Chief Complaint  Patient presents with   Seizures    HPI [name] Cruz is a 63 y.o. male.     63 yo M with a chief complaint of possible seizure.  His sister found him lying down on the driveway.  EMS was called and he was breathing on his own but was hypoxic.  They were assisting ventilations and noted some shaking.  He was given 5 mg of Versed with resolution of the shaking-like activity.  Has a history of seizures.  Patient is nonverbal on arrival, level 5 caveat altered mental status.  Family arrived with further history.  Stated that they had heard from their sister that he had had some shaking activity.  Has been taking his Keppra at home.  Denies any noncompliance.  To their knowledge she has not had any recent illness but since he is most recently been in the hospital he has been more weak and holding his chest and wincing off and on.  The history is provided by a relative.  Seizures  Illness  Severity:  Severe Onset quality:  Sudden Duration:  1 hour Timing:  Rare Progression:  Resolved Chronicity:  Recurrent Associated symptoms: chest pain   Associated symptoms: no abdominal pain, no congestion, no diarrhea, no fever, no headaches, no myalgias, no rash, no shortness of breath and no vomiting     Past Medical History:  Diagnosis Date   Cognitive impairment 2007   after stroke, saw rehab but told to stop because was too upsetting to him   History of chicken pox    HLD (hyperlipidemia)    HTN (hypertension)    NSTEMI (non-ST elevated myocardial infarction) (HCC) 02/21/2017   Obesity    Stroke, hemorrhagic (HCC) 2007   thought 2/2 HTN (240sbp); residual cognitive impairment, loss of R peripheral field, no driving    Patient Active Problem List   Diagnosis Date Noted   Hyperkalemia 10/03/2017   Unilateral  occipital headache 10/03/2017   Ischemic stroke (HCC) 08/23/2017   Coronary artery disease involving coronary bypass graft of native heart without angina pectoris    Atrial fibrillation (HCC)    Acute deep vein thrombosis (DVT) of left upper extremity (HCC)    Dysphagia, post-stroke    Cardiac arrest (HCC) 08/09/2017   S/P CABG x 3 02/24/2017   NSTEMI (non-ST elevated myocardial infarction) (HCC) 02/21/2017   Encephalomalacia 10/28/2012   Seizure disorder (HCC) 04/26/2012   Alcohol abuse 04/26/2012   History of stroke with current residual effects    Cognitive impairment    HLD (hyperlipidemia)    HTN (hypertension)     Past Surgical History:  Procedure Laterality Date   ANKLE SURGERY  1990s   right foot with plate and screws   CORONARY ARTERY BYPASS GRAFT N/A 02/24/2017   3v Procedure: CORONARY ARTERY BYPASS GRAFTING (CABG) x 3 ON PUMP USING LEFT INTERNAL MAMMARY ARTERY TO LEFT ANTERIOR DESENDING CORNARY ARTERY, RIGHT GREATER SAPHENOUS VEIN TO LEFT CIRCUMFLEX ARTERY AND POSTERIOR DESENDING ARTERY. RIGHT GREATER SAPHENOUS VEIN OBTAINED VIA ENDOVEIN HARVEST.;  Surgeon: Delight Ovens, MD   IABP INSERTION N/A 02/21/2017   Procedure: IABP Insertion;  Surgeon: Yvonne Kendall, MD;  Location: [facility] INVASIVE CV LAB;  Service: Cardiovascular;  Laterality: N/A;   LEFT HEART CATH AND CORONARY ANGIOGRAPHY N/A 02/21/2017   Procedure: LEFT HEART CATH AND  CORONARY ANGIOGRAPHY;  Surgeon: Yvonne Kendall, MD;  Location: [facility] INVASIVE CV LAB;  Service: Cardiovascular;  Laterality: N/A;   TEE WITHOUT CARDIOVERSION N/A 02/24/2017   Procedure: TRANSESOPHAGEAL ECHOCARDIOGRAM (TEE);  Surgeon: Delight Ovens, MD;  Location: [facility] OR;  Service: Open Heart Surgery;  Laterality: N/A;        Home Medications    Prior to Admission medications   Medication Sig Start Date End Date Taking? Authorizing Provider  aspirin 81 MG EC tablet Take 1 tablet (81 mg total) by mouth daily. 12/26/17    [name], MD  atorvastatin (LIPITOR) 80 MG tablet TAKE 1 TABLET (80 MG TOTAL) BY MOUTH DAILY. FOR CHOLESTEROL Patient taking differently: Take 80 mg by mouth daily.  12/09/17   Corky Crafts, MD  carvedilol (COREG) 3.125 MG tablet Take 1 tablet (3.125 mg total) by mouth 2 (two) times daily. 12/09/17   Corky Crafts, MD  Cyanocobalamin (B-12) 1000 MCG SUBL Place 1 tablet under the tongue daily. 12/26/17   [name], MD  levETIRAcetam (KEPPRA) 750 MG tablet Take 1 tablet (750 mg total) by mouth 2 (two) times daily. 03/25/18   Melene Plan, DO  Multiple Vitamin (MULTIVITAMIN WITH MINERALS) TABS tablet Take 1 tablet by mouth daily. 12/26/17   [name], MD  sertraline (ZOLOFT) 25 MG tablet Take 0.5 tablets (12.5 mg total) by mouth at bedtime. 12/16/17   [name], MD    Family History Family History  Problem Relation Age of Onset   Alzheimer's disease Maternal Grandfather    Cancer Mother        lymphoma   Alcohol abuse Father        smoker   Coronary artery disease Neg Hx    Stroke Neg Hx    Diabetes Neg Hx     Social History Social History   Tobacco Use   Smoking status: Never Smoker   Smokeless tobacco: Former Neurosurgeon  Substance Use Topics   Alcohol use: Not Currently    Alcohol/week: 14.0 standard drinks    Types: 12 Cans of beer, 2 Shots of liquor per week   Drug use: No     Allergies   Losartan   Review of Systems Review of Systems  Unable to perform ROS: Mental status change  Constitutional: Positive for activity change. Negative for chills and fever.  HENT: Negative for congestion and facial swelling.   Eyes: Negative for discharge and visual disturbance.  Respiratory: Negative for shortness of breath.   Cardiovascular: Positive for chest pain. Negative for palpitations.  Gastrointestinal: Negative for abdominal pain, diarrhea and vomiting.  Musculoskeletal: Negative for arthralgias and myalgias.  Skin: Negative  for color change and rash.  Neurological: Positive for seizures and syncope. Negative for tremors and headaches.  Psychiatric/Behavioral: Negative for confusion and dysphoric mood.     Physical Exam Updated Vital Signs BP 120/88    Pulse 88    Temp (!) 97.5 F (36.4 C) (Axillary)    Resp 10    Ht 5\' 3"  (1.6 m)    Wt 71.2 kg    SpO2 100%    BMI 27.81 kg/m   Physical Exam Vitals signs and nursing note reviewed.  Constitutional:      Appearance: He is well-developed. He is obese.  HENT:     Head: Normocephalic.     Comments: Dried blood noted to the forehead Eyes:     Pupils: Pupils are equal, round, and reactive to light.  Neck:  Musculoskeletal: Normal range of motion and neck supple.     Vascular: No JVD.  Cardiovascular:     Rate and Rhythm: Normal rate and regular rhythm.     Heart sounds: No murmur. No friction rub. No gallop.   Pulmonary:     Effort: No respiratory distress.     Breath sounds: No wheezing.  Abdominal:     General: There is no distension.     Tenderness: There is no guarding or rebound.  Musculoskeletal: Normal range of motion.  Skin:    Coloration: Skin is not pale.     Findings: No rash.  Neurological:     GCS: GCS eye subscore is 1. GCS verbal subscore is 1. GCS motor subscore is 5.      ED Treatments / Results  Labs (all labs ordered are listed, but only abnormal results are displayed) Labs Reviewed  COMPREHENSIVE METABOLIC PANEL - Abnormal; Notable for the following components:      Result Value   CO2 15 (*)    Glucose, Bld 214 (*)    Calcium 8.8 (*)    Anion gap 16 (*)    All other components within normal limits  CBC WITH DIFFERENTIAL/PLATELET - Abnormal; Notable for the following components:   RBC 3.99 (*)    Hemoglobin 12.1 (*)    HCT 37.9 (*)    Neutro Abs 8.5 (*)    All other components within normal limits  CBG MONITORING, ED - Abnormal; Notable for the following components:   Glucose-Capillary 187 (*)    All other  components within normal limits  POCT I-STAT 7, (LYTES, BLD GAS, ICA,H+H) - Abnormal; Notable for the following components:   pH, Arterial 7.257 (*)    pO2, Arterial 117.0 (*)    Bicarbonate 19.2 (*)    TCO2 20 (*)    Acid-base deficit 8.0 (*)    HCT 33.0 (*)    Hemoglobin 11.2 (*)    All other components within normal limits  URINE CULTURE  ETHANOL  URINALYSIS, ROUTINE W REFLEX MICROSCOPIC  BLOOD GAS, ARTERIAL  RAPID URINE DRUG SCREEN, HOSP PERFORMED  LEVETIRACETAM LEVEL  I-STAT TROPONIN, ED    EKG EKG Interpretation  Date/Time:  Wednesday March 25 2018 15:03:55 EDT Ventricular Rate:  115 PR Interval:    QRS Duration: 87 QT Interval:  322 QTC Calculation: 446 R Axis:   -33 Text Interpretation:  Sinus tachycardia Left axis deviation Anteroseptal infarct, age indeterminate Since last tracing rate faster Confirmed by Melene Plan [phone]) on 03/25/2018 3:09:56 PM   Radiology Ct Head Wo Contrast  Result Date: 03/25/2018 CLINICAL DATA:  Patient found unresponsive. Status post grand mal seizure. EXAM: CT HEAD WITHOUT CONTRAST TECHNIQUE: Contiguous axial images were obtained from the base of the skull through the vertex without intravenous contrast. COMPARISON:  CT of the head performed 03/08/2018 FINDINGS: Brain: No evidence of acute infarction, hemorrhage, hydrocephalus, extra-axial collection or mass lesion / mass effect. There is ex vacuo dilatation of the posterior horn of the left lateral ventricle, due to prior ischemic event. Scattered periventricular white matter change likely reflects small vessel ischemic microangiopathy. A small chronic lacunar infarct is noted at the right thalamus. The brainstem and fourth ventricle are within normal limits. The cerebral hemispheres demonstrate grossly normal gray-white differentiation. No mass effect or midline shift is seen. Vascular: No hyperdense vessel or unexpected calcification. Skull: There is no evidence of fracture; visualized osseous  structures are unremarkable in appearance. Sinuses/Orbits: The orbits are within normal  limits. There is mild partial opacification of the mastoid air cells bilaterally. The paranasal sinuses and are well-aerated. Other: No significant soft tissue abnormalities are seen. IMPRESSION: 1. No acute intracranial pathology seen on CT. 2. Scattered small vessel ischemic microangiopathy. 3. Small chronic lacunar infarct at the right thalamus. 4. Mild partial opacification of the mastoid air cells bilaterally. Electronically Signed   By: Roanna RaiderJeffery  Chang M.D.   On: 03/25/2018 15:52   Dg Chest Port 1 View  Result Date: 03/25/2018 CLINICAL DATA:  Patient found unresponsive. Altered mental status. Status post grand mal seizure. EXAM: PORTABLE CHEST 1 VIEW COMPARISON:  Chest radiograph performed 03/08/2018 FINDINGS: The lungs are well-aerated. Mild retrocardiac airspace opacity may reflect atelectasis or possibly mild pneumonia, depending on the patient's symptoms. There is no evidence of pleural effusion or pneumothorax. The patient is status post median sternotomy, with evidence of prior CABG. No acute osseous abnormalities are seen. IMPRESSION: Mild retrocardiac airspace opacity may reflect atelectasis or possibly mild pneumonia, depending on the patient's symptoms. Electronically Signed   By: Roanna RaiderJeffery  Chang M.D.   On: 03/25/2018 16:52    Procedures Procedures (including critical care time)  Medications Ordered in ED Medications  sodium chloride 0.9 % bolus 1,000 mL (0 mLs Intravenous Stopped 03/25/18 1904)  levETIRAcetam (KEPPRA) IVPB 1000 mg/100 mL premix (0 mg Intravenous Stopped 03/25/18 1904)     Initial Impression / Assessment and Plan / ED Course  I have reviewed the triage vital signs and the nursing notes.  Pertinent labs & imaging results that were available during my care of the patient were reviewed by me and considered in my medical decision making (see chart for details).  Clinical Course as of  Mar 25 1922  Wed Mar 25, 2018  1627 Patient's lab work is returned, he has a anion gap acidosis most likely is lactate due to seizure activity.  His ethanol is negative he is not yet had a urine sample.  Hemoglobin appears to be at baseline.  CT the head without acute change.  Troponin negative.  Tachycardia improved with fluids. Still somewhat sleepy.  Not talking, not at baseline   [DF]    Clinical Course User Index [DF] Melene PlanFloyd, Makila Colombe, DO       63 yo M with a chief complaint of unresponsiveness.  Patient does have a history of seizures and there was some comments of shaking on the scene.  Of note the patient's record reviewed and the last 2 times he came to the hospital for seizure-like activity he ended up in cardiac arrest and the other time he had an NSTEMI.  Will obtain a work-up.  Currently he is very minimally responsive though is localizing to pain.  Continue to frequently reassess.  Patient with continued improvement of mentation, though still non verbal, not following commands.  Patient is not yet been able to provide a urine sample.  And out cath was attempted but the patient was very agitated and so we will wait for provided urine specimen.  I discussed the case with Dr. Otelia LimesLindzen he recommended loading the patient with a gram of Keppra.  Plan is to assess for improvement and to discharge home on an increased dose of 750 twice daily.  Patient reassessed multiple times with no significant improvement.  He is now talking but is continuing to be confused does not know his family members.  I discussed the case with Dr. Jerrell BelfastAurora, recommended medical admission for prolonged postictal period and he will follow the patient  in the hospital.  CRITICAL CARE Performed by: Rae Roam   Total critical care time: 35 minutes  Critical care time was exclusive of separately billable procedures and treating other patients.  Critical care was necessary to treat or prevent imminent or  life-threatening deterioration.  Critical care was time spent personally by me on the following activities: development of treatment plan with patient and/or surrogate as well as nursing, discussions with consultants, evaluation of patient's response to treatment, examination of patient, obtaining history from patient or surrogate, ordering and performing treatments and interventions, ordering and review of laboratory studies, ordering and review of radiographic studies, pulse oximetry and re-evaluation of patient's condition.   The patients results and plan were reviewed and discussed.   Any x-rays performed were independently reviewed by myself.   Differential diagnosis were considered with the presenting HPI.  Medications  sodium chloride 0.9 % bolus 1,000 mL (0 mLs Intravenous Stopped 03/25/18 1904)  levETIRAcetam (KEPPRA) IVPB 1000 mg/100 mL premix (0 mg Intravenous Stopped 03/25/18 1904)    Vitals:   03/25/18 1715 03/25/18 1745 03/25/18 1815 03/25/18 1830  BP: (!) 113/99 112/82 (!) 111/98 120/88  Pulse: 85 82  88  Resp: Temp:      TempSrc:      SpO2: 100% 100%  100%  Weight:      Height:        Final diagnoses:  Seizure (HCC)    Admission/ observation were discussed with the admitting physician, patient and/or family and they are comfortable with the plan.    Final Clinical Impressions(s) / ED Diagnoses   Final diagnoses:  Seizure [facility])    ED Discharge Orders         Ordered    levETIRAcetam (KEPPRA) 750 MG tablet  2 times daily     03/25/18 1721           Melene Plan, DO 03/25/18 1925

## 2018-03-25 NOTE — ED Notes (Signed)
ED TO INPATIENT HANDOFF REPORT  ED Nurse Name and Phone #: [place] [phone]  S Name/Age/Gender [name] Cruz 63 y.o. male Room/Bed: 037C/037C  Code Status   Code Status: Prior  Home/SNF/Other Home {Patient oriented to: Self Is this baseline? No   Triage Complete: Triage complete  Chief Complaint seizure  Triage Note Pt BIB [facility]. Pt was found unresponsive in his driveway by his sister. Pt reported to be having a grand mal seizure when upon EMS arrival. EMS gave  IM of versed and seizing stopped. Pt withdraws from painful stimuli upon arrival to ED. Pt is 85% on room air upon arrival to ED. Pt is 94% on 4L at this time. Per EMS pt does have a past medical history of seizures.    Allergies Allergies  Allergen Reactions  . Losartan Other (See Comments)    hyperkalemia    Level of Care/Admitting Diagnosis ED Disposition    ED Disposition Condition Comment   Admit  Hospital Area: [facility] [[id number]]  Level of Care: Progressive [102]  I expect the patient will be discharged within 24 hours: No (not a candidate for 5C-Observation unit)  Diagnosis: Seizure (HCC) [205090]  Admitting Physician: Eduard Clos [phone]  Attending Physician: Eduard Clos Florian.Pax  PT Class (Do Not Modify): Observation [104]  PT Acc Code (Do Not Modify): Observation [10022]       B Medical/Surgery History Past Medical History:  Diagnosis Date  . Cognitive impairment 2007   after stroke, saw rehab but told to stop because was too upsetting to him  . History of chicken pox   . HLD (hyperlipidemia)   . HTN (hypertension)   . NSTEMI (non-ST elevated myocardial infarction) (HCC) 02/21/2017  . Obesity   . Stroke, hemorrhagic (HCC) 2007   thought 2/2 HTN (240sbp); residual cognitive impairment, loss of R peripheral field, no driving   Past Surgical History:  Procedure Laterality Date  . ANKLE SURGERY  1990s   right foot with plate and screws  . CORONARY ARTERY  BYPASS GRAFT N/A 02/24/2017   3v Procedure: CORONARY ARTERY BYPASS GRAFTING (CABG) x 3 ON PUMP USING LEFT INTERNAL MAMMARY ARTERY TO LEFT ANTERIOR DESENDING CORNARY ARTERY, RIGHT GREATER SAPHENOUS VEIN TO LEFT CIRCUMFLEX ARTERY AND POSTERIOR DESENDING ARTERY. RIGHT GREATER SAPHENOUS VEIN OBTAINED VIA ENDOVEIN HARVEST.;  Surgeon: Delight Ovens, MD  . IABP INSERTION N/A 02/21/2017   Procedure: IABP Insertion;  Surgeon: Yvonne Kendall, MD;  Location: [facility] INVASIVE CV LAB;  Service: Cardiovascular;  Laterality: N/A;  . LEFT HEART CATH AND CORONARY ANGIOGRAPHY N/A 02/21/2017   Procedure: LEFT HEART CATH AND CORONARY ANGIOGRAPHY;  Surgeon: Yvonne Kendall, MD;  Location: [facility] INVASIVE CV LAB;  Service: Cardiovascular;  Laterality: N/A;  . TEE WITHOUT CARDIOVERSION N/A 02/24/2017   Procedure: TRANSESOPHAGEAL ECHOCARDIOGRAM (TEE);  Surgeon: Delight Ovens, MD;  Location: [facility] OR;  Service: Open Heart Surgery;  Laterality: N/A;     A IV Location/Drains/Wounds Patient Lines/Drains/Airways Status   Active Line/Drains/Airways    Name:   Placement date:   Placement time:   Site:   Days:   Peripheral IV 03/25/18 Left Forearm   03/25/18    1454    Forearm   less than 1   Wound / Incision (Open or Dehisced) Laceration Ankle Right   -    -    Ankle             Intake/Output Last 24 hours  Intake/Output Summary (Last 24 hours) at  03/25/2018 2201 Last data filed at 03/25/2018 1904 Gross per 24 hour  Intake 1092.04 ml  Output -  Net 1092.04 ml    Labs/Imaging Results for orders placed or performed during the hospital encounter of 03/25/18 (from the past 48 hour(s))  Comprehensive metabolic panel     Status: Abnormal   Collection Time: 03/25/18  3:17 PM  Result Value Ref Range   Sodium 135 135 - 145 mmol/L   Potassium 3.6 3.5 - 5.1 mmol/L   Chloride 104 98 - 111 mmol/L   CO2 15 (L) 22 - 32 mmol/L   Glucose, Bld 214 (H) 70 - 99 mg/dL   BUN 10 8 - 23 mg/dL   Creatinine, Ser 1.91 0.61 - 1.24 mg/dL    Calcium 8.8 (L) 8.9 - 10.3 mg/dL   Total Protein 6.7 6.5 - 8.1 g/dL   Albumin 3.9 3.5 - 5.0 g/dL   AST 28 15 - 41 U/L   ALT 20 0 - 44 U/L   Alkaline Phosphatase 71 38 - 126 U/L   Total Bilirubin 0.5 0.3 - 1.2 mg/dL   GFR calc non Af Amer >60 >60 mL/min   GFR calc Af Amer >60 >60 mL/min   Anion gap 16 (H) 5 - 15    Comment: Performed at [facility], [address] [place], [place] [address]  Ethanol     Status: None   Collection Time: 03/25/18  3:17 PM  Result Value Ref Range   Alcohol, Ethyl (B) <10 <10 mg/dL    Comment: (NOTE) Lowest detectable limit for serum alcohol is 10 mg/dL. For medical purposes only. Performed at [facility], [address] [place], [place] [address]   CBC WITH DIFFERENTIAL     Status: Abnormal   Collection Time: 03/25/18  3:17 PM  Result Value Ref Range   WBC 10.3 4.0 - 10.5 K/uL   RBC 3.99 (L) 4.22 - 5.81 MIL/uL   Hemoglobin 12.1 (L) 13.0 - 17.0 g/dL   HCT 08.6 (L) 57.8 - 46.9 %   MCV 95.0 80.0 - 100.0 fL   MCH 30.3 26.0 - 34.0 pg   MCHC 31.9 30.0 - 36.0 g/dL   RDW 62.9 52.8 - 41.3 %   Platelets 208 150 - 400 K/uL   nRBC 0.0 0.0 - 0.2 %   Neutrophils Relative % 83 %   Neutro Abs 8.5 (H) 1.7 - 7.7 K/uL   Lymphocytes Relative 11 %   Lymphs Abs 1.2 0.7 - 4.0 K/uL   Monocytes Relative 5 %   Monocytes Absolute 0.5 0.1 - 1.0 K/uL   Eosinophils Relative 0 %   Eosinophils Absolute 0.0 0.0 - 0.5 K/uL   Basophils Relative 0 %   Basophils Absolute 0.0 0.0 - 0.1 K/uL   Immature Granulocytes 1 %   Abs Immature Granulocytes 0.07 0.00 - 0.07 K/uL    Comment: Performed at [facility], [address] [place], [place] [address]  CBG monitoring, ED     Status: Abnormal   Collection Time: 03/25/18  3:29 PM  Result Value Ref Range   Glucose-Capillary 187 (H) 70 - 99 mg/dL  I-stat troponin, ED     Status: None   Collection Time: 03/25/18  3:43 PM  Result Value Ref Range   Troponin i, poc 0.00 0.00 - 0.08 ng/mL   Comment 3             Comment: Due to the release kinetics of cTnI, a  negative result within the first hours of the onset of symptoms does not rule out myocardial infarction with certainty. If myocardial infarction is still suspected, repeat the test at appropriate intervals.   I-STAT 7, (LYTES, BLD GAS, ICA, H+H)     Status: Abnormal   Collection Time: 03/25/18  3:59 PM  Result Value Ref Range   pH, Arterial 7.257 (L) 7.350 - 7.450   pCO2 arterial 43.1 32.0 - 48.0 mmHg   pO2, Arterial 117.0 (H) 83.0 - 108.0 mmHg   Bicarbonate 19.2 (L) 20.0 - 28.0 mmol/L   TCO2 20 (L) 22 - 32 mmol/L   O2 Saturation 98.0 %   Acid-base deficit 8.0 (H) 0.0 - 2.0 mmol/L   Sodium 136 135 - 145 mmol/L   Potassium 3.6 3.5 - 5.1 mmol/L   Calcium, Ion 1.22 1.15 - 1.40 mmol/L   HCT 33.0 (L) 39.0 - 52.0 %   Hemoglobin 11.2 (L) 13.0 - 17.0 g/dL   Patient temperature 60.6 F    Collection site RADIAL, ALLEN'S TEST ACCEPTABLE    Drawn by RT    Sample type ARTERIAL   Urinalysis, Routine w reflex microscopic (not at [facility])     Status: Abnormal   Collection Time: 03/25/18  9:30 PM  Result Value Ref Range   Color, Urine YELLOW YELLOW   APPearance CLEAR CLEAR   Specific Gravity, Urine 1.011 1.005 - 1.030   pH 5.0 5.0 - 8.0   Glucose, UA NEGATIVE NEGATIVE mg/dL   Hgb urine dipstick NEGATIVE NEGATIVE   Bilirubin Urine NEGATIVE NEGATIVE   Ketones, ur 5 (A) NEGATIVE mg/dL   Protein, ur NEGATIVE NEGATIVE mg/dL   Nitrite NEGATIVE NEGATIVE   Leukocytes,Ua NEGATIVE NEGATIVE    Comment: Performed at [facility], [address] [place], [place] [address]   Ct Head Wo Contrast  Result Date: 03/25/2018 CLINICAL DATA:  Patient found unresponsive. Status post grand mal seizure. EXAM: CT HEAD WITHOUT CONTRAST TECHNIQUE: Contiguous axial images were obtained from the base of the skull through the vertex without intravenous contrast. COMPARISON:  CT of the head performed 03/08/2018 FINDINGS: Brain: No evidence of acute infarction,  hemorrhage, hydrocephalus, extra-axial collection or mass lesion / mass effect. There is ex vacuo dilatation of the posterior horn of the left lateral ventricle, due to prior ischemic event. Scattered periventricular white matter change likely reflects small vessel ischemic microangiopathy. A small chronic lacunar infarct is noted at the right thalamus. The brainstem and fourth ventricle are within normal limits. The cerebral hemispheres demonstrate grossly normal gray-white differentiation. No mass effect or midline shift is seen. Vascular: No hyperdense vessel or unexpected calcification. Skull: There is no evidence of fracture; visualized osseous structures are unremarkable in appearance. Sinuses/Orbits: The orbits are within normal limits. There is mild partial opacification of the mastoid air cells bilaterally. The paranasal sinuses and are well-aerated. Other: No significant soft tissue abnormalities are seen. IMPRESSION: 1. No acute intracranial pathology seen on CT. 2. Scattered small vessel ischemic microangiopathy. 3. Small chronic lacunar infarct at the right thalamus. 4. Mild partial opacification of the mastoid air cells bilaterally. Electronically Signed   By: Roanna Raider M.D.   On: 03/25/2018 15:52   Dg Chest Port 1 View  Result Date: 03/25/2018 CLINICAL DATA:  Patient found unresponsive. Altered mental status. Status post grand mal seizure. EXAM: PORTABLE CHEST 1 VIEW COMPARISON:  Chest radiograph performed 03/08/2018 FINDINGS: The lungs are well-aerated. Mild retrocardiac airspace opacity may reflect atelectasis or possibly mild pneumonia, depending on  the patient's symptoms. There is no evidence of pleural effusion or pneumothorax. The patient is status post median sternotomy, with evidence of prior CABG. No acute osseous abnormalities are seen. IMPRESSION: Mild retrocardiac airspace opacity may reflect atelectasis or possibly mild pneumonia, depending on the patient's symptoms.  Electronically Signed   By: Roanna Raider M.D.   On: 03/25/2018 16:52    Pending Labs Unresulted Labs (From admission, onward)    Start     Ordered   03/25/18 1520  Levetiracetam level  Once,   R     03/25/18 1519   03/25/18 1518  Rapid urine drug screen (hospital performed)  ONCE - STAT,   R     03/25/18 1517   03/25/18 1517  Urine culture  ONCE - STAT,   STAT     03/25/18 1517   03/25/18 1517  Blood gas, arterial  Once,   STAT     03/25/18 1517          Vitals/Pain Today's Vitals   03/25/18 2045 03/25/18 2105 03/25/18 2115 03/25/18 2130  BP: 114/74  113/82 124/79  Pulse: 81  83 87  Resp: 10  17 14   Temp:      TempSrc:      SpO2: 100%  100% 100%  Weight:      Height:      PainSc:  0-No pain      Isolation Precautions No active isolations  Medications Medications  levETIRAcetam (KEPPRA) IVPB 1000 mg/100 mL premix (has no administration in time range)  sodium chloride 0.9 % bolus 1,000 mL (0 mLs Intravenous Stopped 03/25/18 1904)  levETIRAcetam (KEPPRA) IVPB 1000 mg/100 mL premix (0 mg Intravenous Stopped 03/25/18 1904)  fosPHENYtoin (CEREBYX) 1,424 mg PE in sodium chloride 0.9 % 50 mL IVPB (1,424 mg PE Intravenous New Bag/Given 03/25/18 2150)    Mobility non-ambulatory     Focused Assessments Neuro Assessment Handoff:  Swallow screen pass? N/A Cardiac Rhythm: Normal sinus rhythm       Neuro Assessment: Exceptions to WDL Neuro Checks:      Last Documented NIHSS Modified Score:   Has TPA been given? No If patient is a Neuro Trauma and patient is going to OR before floor call report to 4N Charge nurse: [phone] or [phone]     R Recommendations: See Admitting Provider Note  Report given to:   Additional Notes:  Pt Status Epilepticus, Pt not oriented or following commands. Family at bedside.

## 2018-03-25 NOTE — ED Notes (Signed)
Per Dr.Floyd we can hold of on in and out cath and rectal temp since pt is combative and Dr. Adela Lank doesn't want to give pt meds to sedate him for it.

## 2018-03-25 NOTE — Consult Note (Signed)
Neurology Consultation  Reason for Consult: seizure Referring Physician: Dr Adela LankFloyd  CC: AMS after seizure  History is obtained from:   HPI: [name] Cruz is a 63 y.o. male past medical history of ICH in the left hemisphere with residual aphasia and right visual field deficits along with cognitive impairment, hypertension, hyperlipidemia, presenting to the emergency room today after having a presumed seizure and found down lying down on the driveway.  He was hypoxic at that time and upon EMS arrival exhibited some resolving shaking like activity.  Given his history of seizures and him being nonverbal at the time, he was given 5 mg of Versed that resolved the shaking activity.  He has since, which has been over 5 hours, been altered and not following commands although he is awake and alert. Neurological consultation was obtained because in the past has seizures have been prolonged and a postictal state has sometimes been status epilepticus.  His hospitalizations have also been complicated by multiple cardiac issues. Patient is unable to provide any history.  The seizure was witnessed by 1 of the sisters was not here at the bedside.  The other 2 sisters who are also currently involved in his care are at the bedside but they did not witness the event to be able to provide any meaningful history.  ROS:  ROS was performed and is negative except as noted in the HPI.  Past Medical History:  Diagnosis Date  . Cognitive impairment 2007   after stroke, saw rehab but told to stop because was too upsetting to him  . History of chicken pox   . HLD (hyperlipidemia)   . HTN (hypertension)   . NSTEMI (non-ST elevated myocardial infarction) (HCC) 02/21/2017  . Obesity   . Stroke, hemorrhagic (HCC) 2007   thought 2/2 HTN (240sbp); residual cognitive impairment, loss of R peripheral field, no driving    Family History  Problem Relation Age of Onset  . Alzheimer's disease Maternal Grandfather   . Cancer  Mother        lymphoma  . Alcohol abuse Father        smoker  . Coronary artery disease Neg Hx   . Stroke Neg Hx   . Diabetes Neg Hx    Social History:   reports that he has never smoked. He has quit using smokeless tobacco. He reports previous alcohol use of about 14.0 standard drinks of alcohol per week. He reports that he does not use drugs.   Medications No current facility-administered medications for this encounter.   Current Outpatient Medications:  .  aspirin 81 MG EC tablet, Take 1 tablet (81 mg total) by mouth daily., Disp: 90 tablet, Rfl: 0 .  atorvastatin (LIPITOR) 80 MG tablet, TAKE 1 TABLET (80 MG TOTAL) BY MOUTH DAILY. FOR CHOLESTEROL (Patient taking differently: Take 80 mg by mouth daily. ), Disp: 90 tablet, Rfl: 3 .  carvedilol (COREG) 3.125 MG tablet, Take 1 tablet (3.125 mg total) by mouth 2 (two) times daily., Disp: 180 tablet, Rfl: 3 .  Cyanocobalamin (B-12) 1000 MCG SUBL, Place 1 tablet under the tongue daily., Disp: 90 each, Rfl: 0 .  levETIRAcetam (KEPPRA) 750 MG tablet, Take 1 tablet (750 mg total) by mouth 2 (two) times daily., Disp: 60 tablet, Rfl: 0 .  Multiple Vitamin (MULTIVITAMIN WITH MINERALS) TABS tablet, Take 1 tablet by mouth daily., Disp: 90 tablet, Rfl: 0 .  sertraline (ZOLOFT) 25 MG tablet, Take 0.5 tablets (12.5 mg total) by mouth at bedtime., Disp:  45 tablet, Rfl: 3  Exam: Current vital signs: BP 130/88   Pulse (!) 107   Temp (!) 97.5 F (36.4 C) (Axillary)   Resp 15   Ht 5\' 3"  (1.6 m)   Wt 71.2 kg   SpO2 96%   BMI 27.81 kg/m  Vital signs in last 24 hours: Temp:  [97.5 F (36.4 C)] 97.5 F (36.4 C) (03/11 1530) Pulse Rate:  [81-112] 107 (03/11 1915) Resp:  [9-24] 15 (03/11 1915) BP: (109-140)/(67-99) 130/88 (03/11 1915) SpO2:  [93 %-100 %] 96 % (03/11 1915) Weight:  [71.2 kg] 71.2 kg (03/11 1510) General: Awake alert in no distress HEENT: Normocephalic atraumatic with a small laceration on the forehead Lungs: Clear to  auscultation Cardiovascular: Respiratory regular rate rhythm Abdomen soft nondistended nontender Extremities: Warm well perfused Neurological exam Patient awake alert. He is nonverbal He does not follow verbal commands. He is able to mimic some commands more on the left than on the right. He does not blink to threat from either direction He is a mild right facial weakness. Cranial nerves: Pupils equal round react light, does not blink to threat from either side, mild facial asymmetry Motor exam: No obvious focal deficit as he is able to raise all 4 extremities antigravity without drift. Sensory exam: Withdraws to painful stimulus strongly in all fours. Unable to assess coordination due to mental status  Labs I have reviewed labs in epic and the results pertinent to this consultation are:  CBC    Component Value Date/Time   WBC 10.3 03/25/2018 1517   RBC 3.99 (L) 03/25/2018 1517   HGB 11.2 (L) 03/25/2018 1559   HGB 12.7 (L) 09/24/2017 1327   HCT 33.0 (L) 03/25/2018 1559   HCT 37.9 09/24/2017 1327   PLT 208 03/25/2018 1517   PLT 302 09/24/2017 1327   MCV 95.0 03/25/2018 1517   MCV 93 09/24/2017 1327   MCH 30.3 03/25/2018 1517   MCHC 31.9 03/25/2018 1517   RDW 11.9 03/25/2018 1517   RDW 14.0 09/24/2017 1327   LYMPHSABS 1.2 03/25/2018 1517   MONOABS 0.5 03/25/2018 1517   EOSABS 0.0 03/25/2018 1517   BASOSABS 0.0 03/25/2018 1517   CMP     Component Value Date/Time   NA 136 03/25/2018 1559   NA 139 12/02/2017 1154   K 3.6 03/25/2018 1559   CL 104 03/25/2018 1517   CO2 15 (L) 03/25/2018 1517   GLUCOSE 214 (H) 03/25/2018 1517   BUN 10 03/25/2018 1517   BUN 15 12/02/2017 1154   CREATININE 1.17 03/25/2018 1517   CALCIUM 8.8 (L) 03/25/2018 1517   PROT 6.7 03/25/2018 1517   PROT 6.6 07/11/2017 0950   ALBUMIN 3.9 03/25/2018 1517   ALBUMIN 4.3 07/11/2017 0950   AST 28 03/25/2018 1517   ALT 20 03/25/2018 1517   ALKPHOS 71 03/25/2018 1517   BILITOT 0.5 03/25/2018 1517    BILITOT 0.2 07/11/2017 0950   GFRNONAA >60 03/25/2018 1517   GFRAA >60 03/25/2018 1517   Imaging I have reviewed the images obtained:  CT-scan of the brain shows no acute changes.  It shows evidence of old encephalomalacia in exvacuodilatation of the posterior horn of the left lateral ventricle due to prior stroke.  Small chronic lacunar infarct in the right thalamus.  Chronic small vessel disease.  Assessment: 63 year old man with past medical history of ICH in the left hemisphere with residual aphasia and right visual field deficits along with cognitive impairment, hypertension, hyperlipidemia presenting today after presumed seizure  with no return back to baseline. He is likely been in status epilepticus, subclinical. He has received a load of Keppra, he is more awake and alert but still nonverbal and not following commands. Likely prolonged postictal state versus subclinical status epilepticus.  Impression: Status epilepticus Seizure disorder with prolonged postictal state History of old ICH  Recommendations: Increase Keppra to 1000 twice daily Already received 1 g load. Start Keppra from tomorrow morning Load fosphenytoin 20 mg/kg total equivalents x1 now Decision to continue phenytoin tomorrow after rounding of the neurology team EEG in the morning.  If mentation worsens, might consider doing a stat EEG at night. Seizure precautions Agree with stepdown admission Check labs and urinalysis and chest x-ray for any evidence of underlying infection that might have lowered seizure threshold Neurology will follow with you.  -- Milon Dikes, MD [facility] Pager: [phone] If 7pm to 7am, please call on call as listed on AMION.   CRITICAL CARE ATTESTATION Performed by: Milon Dikes, MD Total critical care time: 40 minutes Critical care time was exclusive of separately billable procedures and treating other patients and/or supervising  APPs/Residents/Students Critical care was necessary to treat or prevent imminent or life-threatening deterioration due to status epilepticus, seizures This patient is critically ill and at significant risk for neurological worsening and/or death and care requires constant monitoring. Critical care was time spent personally by me on the following activities: development of treatment plan with patient and/or surrogate as well as nursing, discussions with consultants, evaluation of patient's response to treatment, examination of patient, obtaining history from patient or surrogate, ordering and performing treatments and interventions, ordering and review of laboratory studies, ordering and review of radiographic studies, pulse oximetry, re-evaluation of patient's condition, participation in multidisciplinary rounds and medical decision making of high complexity in the care of this patient.

## 2018-03-25 NOTE — ED Triage Notes (Signed)
Pt BIB [facility]. Pt was found unresponsive in his driveway by his sister. Pt reported to be having a grand mal seizure when upon EMS arrival. EMS gave 5mg  IM of versed and seizing stopped. Pt withdraws from painful stimuli upon arrival to ED. Pt is 85% on room air upon arrival to ED. Pt is 94% on 4L at this time. Per EMS pt does have a past medical history of seizures.

## 2018-03-25 NOTE — Progress Notes (Signed)
ABG given to RN, results were on 5 LPM nasal cannula  PH- 7.25 CO2- 43.1 O2-117 HCO3-19.2 SPo2- 98%

## 2018-03-25 NOTE — ED Notes (Signed)
Pt transported to 3W 

## 2018-03-25 NOTE — Progress Notes (Signed)
Pharmacy Antibiotic Note  KOAL MARTIGNETTI is a 63 y.o. male admitted on 03/25/2018 with AMS after seizure and now with concerns for aspiration pneumonia. Pharmacy has been consulted for zosyn dosing. Patient currently afebrile, RR 21, HR wnl, WBC 10.3.   Plan: Zosyn 3.375g IV q8h (4 hour infusion).  Monitor Scr, CBC, and clinical progression F/u C&S, de-escalation plans, and LOT  Height: 5\' 3"  (160 cm) Weight: 157 lb (71.2 kg) IBW/kg (Calculated) : 56.9  Temp (24hrs), Avg:97.5 F (36.4 C), Min:97.5 F (36.4 C), Max:97.5 F (36.4 C)  Recent Labs  Lab 03/25/18 1517  WBC 10.3  CREATININE 1.17    Estimated Creatinine Clearance: 58 mL/min (by C-G formula based on SCr of 1.17 mg/dL).    Allergies  Allergen Reactions  . Losartan Other (See Comments)    hyperkalemia    Antimicrobials this admission: Zosyn 3/11 >>   Dose adjustments this admission: N/A  Microbiology results: 3/11 UCx:     Thank you for allowing pharmacy to be a part of this patient's care.  Lenord Carbo, PharmD PGY1 Pharmacy Resident  Please check AMION for all [facility]acy phone numbers 03/25/2018 10:28 PM

## 2018-03-26 ENCOUNTER — Observation Stay (HOSPITAL_BASED_OUTPATIENT_CLINIC_OR_DEPARTMENT_OTHER): Payer: Medicare Other

## 2018-03-26 DIAGNOSIS — I361 Nonrheumatic tricuspid (valve) insufficiency: Secondary | ICD-10-CM

## 2018-03-26 DIAGNOSIS — I10 Essential (primary) hypertension: Secondary | ICD-10-CM

## 2018-03-26 DIAGNOSIS — R569 Unspecified convulsions: Secondary | ICD-10-CM

## 2018-03-26 LAB — RETICULOCYTES
Immature Retic Fract: 9.3 % (ref 2.3–15.9)
RBC.: 3.75 MIL/uL — ABNORMAL LOW (ref 4.22–5.81)
Retic Count, Absolute: 47.2 10*3/uL (ref 19.0–186.0)
Retic Ct Pct: 1.3 % (ref 0.4–3.1)

## 2018-03-26 LAB — BASIC METABOLIC PANEL
Anion gap: 13 (ref 5–15)
BUN: 7 mg/dL — ABNORMAL LOW (ref 8–23)
CO2: 21 mmol/L — ABNORMAL LOW (ref 22–32)
Calcium: 8.5 mg/dL — ABNORMAL LOW (ref 8.9–10.3)
Chloride: 105 mmol/L (ref 98–111)
Creatinine, Ser: 0.89 mg/dL (ref 0.61–1.24)
GFR calc Af Amer: >60 mL/min (ref >60)
GFR calc non Af Amer: >60 mL/min (ref >60)
Glucose, Bld: 111 mg/dL — ABNORMAL HIGH (ref 70–99)
Potassium: 3.9 mmol/L (ref 3.5–5.1)
SODIUM: 139 mmol/L (ref 135–145)

## 2018-03-26 LAB — LACTIC ACID, PLASMA
Lactic Acid, Venous: 1.9 mmol/L (ref 0.5–1.9)
Lactic Acid, Venous: 1.3 mmol/L (ref 0.5–1.9)
Lactic Acid, Venous: 1.2 mmol/L (ref 0.5–1.9)

## 2018-03-26 LAB — HEPATIC FUNCTION PANEL
ALT: 20 U/L (ref 0–44)
AST: 26 U/L (ref 15–41)
Albumin: 3.8 g/dL (ref 3.5–5.0)
Alkaline Phosphatase: 62 U/L (ref 38–126)
Bilirubin, Direct: 0.2 mg/dL (ref 0.0–0.2)
Indirect Bilirubin: 1 mg/dL — ABNORMAL HIGH (ref 0.3–0.9)
Total Bilirubin: 1.2 mg/dL (ref 0.3–1.2)
Total Protein: 6.4 g/dL — ABNORMAL LOW (ref 6.5–8.1)

## 2018-03-26 LAB — HEMOGLOBIN A1C
Hgb A1c MFr Bld: 5.4 % (ref 4.8–5.6)
Mean Plasma Glucose: 108.28 mg/dL

## 2018-03-26 LAB — CBC WITH DIFFERENTIAL/PLATELET
Abs Immature Granulocytes: 0.05 10*3/uL (ref 0.00–0.07)
BASOS PCT: 0 %
Basophils Absolute: 0 10*3/uL (ref 0.0–0.1)
Eosinophils Absolute: 0 10*3/uL (ref 0.0–0.5)
Eosinophils Relative: 0 %
HCT: 36.1 % — ABNORMAL LOW (ref 39.0–52.0)
Hemoglobin: 11.9 g/dL — ABNORMAL LOW (ref 13.0–17.0)
Immature Granulocytes: 0 %
Lymphocytes Relative: 7 %
Lymphs Abs: 1 10*3/uL (ref 0.7–4.0)
MCH: 29.7 pg (ref 26.0–34.0)
MCHC: 33 g/dL (ref 30.0–36.0)
MCV: 90 fL (ref 80.0–100.0)
Monocytes Absolute: 0.7 10*3/uL (ref 0.1–1.0)
Monocytes Relative: 5 %
Neutro Abs: 12 10*3/uL — ABNORMAL HIGH (ref 1.7–7.7)
Neutrophils Relative %: 88 %
Platelets: 177 10*3/uL (ref 150–400)
RBC: 4.01 MIL/uL — AB (ref 4.22–5.81)
RDW: 12.1 % (ref 11.5–15.5)
WBC: 13.8 10*3/uL — ABNORMAL HIGH (ref 4.0–10.5)
nRBC: 0 % (ref 0.0–0.2)

## 2018-03-26 LAB — GLUCOSE, CAPILLARY
GLUCOSE-CAPILLARY: 139 mg/dL — AB (ref 70–99)
GLUCOSE-CAPILLARY: 110 mg/dL — AB (ref 70–99)
Glucose-Capillary: 113 mg/dL — ABNORMAL HIGH (ref 70–99)
Glucose-Capillary: 99 mg/dL (ref 70–99)
Glucose-Capillary: 95 mg/dL (ref 70–99)
Glucose-Capillary: 107 mg/dL — ABNORMAL HIGH (ref 70–99)

## 2018-03-26 LAB — STREP PNEUMONIAE URINARY ANTIGEN: Strep Pneumo Urinary Antigen: NEGATIVE

## 2018-03-26 LAB — VITAMIN B12: Vitamin B-12: 731 pg/mL (ref 180–914)

## 2018-03-26 LAB — FOLATE: Folate: 32.9 ng/mL (ref >5.9)

## 2018-03-26 LAB — IRON AND TIBC
Iron: 22 ug/dL — ABNORMAL LOW (ref 45–182)
Saturation Ratios: 7 % — ABNORMAL LOW (ref 17.9–39.5)
TIBC: 301 ug/dL (ref 250–450)
UIBC: 279 ug/dL

## 2018-03-26 LAB — TROPONIN I
Troponin I: 0.59 ng/mL (ref <0.03)
Troponin I: 0.34 ng/mL (ref <0.03)

## 2018-03-26 LAB — FERRITIN: Ferritin: 72 ng/mL (ref 24–336)

## 2018-03-26 LAB — ECHOCARDIOGRAM COMPLETE
Height: 63 in
Weight: 2960 oz

## 2018-03-26 LAB — HIV ANTIBODY (ROUTINE TESTING W REFLEX): HIV Screen 4th Generation wRfx: NONREACTIVE

## 2018-03-26 LAB — PROCALCITONIN: Procalcitonin: 0.12 ng/mL

## 2018-03-26 LAB — LEVETIRACETAM LEVEL: LEVETIRACETAM: 19.6 ug/mL (ref 10.0–40.0)

## 2018-03-26 MED ORDER — CARVEDILOL 3.125 MG PO TABS
3.1250 mg | ORAL_TABLET | Freq: Two times a day (BID) | ORAL | Status: DC
  Administered 2018-03-26 – 2018-03-29 (×6): 3.125 mg via ORAL
  Filled 2018-03-26 (×6): qty 1

## 2018-03-26 MED ORDER — SODIUM CHLORIDE 0.9 % IV SOLN
3.0000 g | Freq: Four times a day (QID) | INTRAVENOUS | Status: DC
  Administered 2018-03-26 – 2018-03-27 (×4): 3 g via INTRAVENOUS
  Filled 2018-03-26 (×5): qty 3

## 2018-03-26 MED ORDER — ATORVASTATIN CALCIUM 80 MG PO TABS
80.0000 mg | ORAL_TABLET | Freq: Every day | ORAL | Status: DC
  Administered 2018-03-26 – 2018-03-29 (×4): 80 mg via ORAL
  Filled 2018-03-26 (×4): qty 1

## 2018-03-26 MED ORDER — FERROUS SULFATE 325 (65 FE) MG PO TABS
325.0000 mg | ORAL_TABLET | Freq: Every day | ORAL | Status: DC
  Administered 2018-03-27 – 2018-03-29 (×3): 325 mg via ORAL
  Filled 2018-03-26 (×3): qty 1

## 2018-03-26 MED ORDER — ASPIRIN EC 81 MG PO TBEC
81.0000 mg | DELAYED_RELEASE_TABLET | Freq: Every day | ORAL | Status: DC
  Administered 2018-03-26 – 2018-03-29 (×4): 81 mg via ORAL
  Filled 2018-03-26 (×4): qty 1

## 2018-03-26 NOTE — Care Management Obs Status (Signed)
MEDICARE OBSERVATION STATUS NOTIFICATION   Patient Details  Name: [name] Cruz MRN: [id number] Date of Birth: [birth year]   Medicare Observation Status Notification Given:  Yes    Kermit Balo, RN 03/26/2018, 4:52 PM

## 2018-03-26 NOTE — Progress Notes (Addendum)
Subjective: Patient evaluated at bedside, he is eating lunch without assistance. He has no complaints; he is not able to state why he is in the hospital or what events occurred yesterday and is nonsensical in his speech.  Per RN who described current status of patient to patient's sister, she stated that patient is usually oriented and able to communicate for himself overall.  Objective: Current vital signs: BP (!) 92/54 (BP Location: Right Arm)   Pulse 75   Temp 98.7 F (37.1 C) (Oral)   Resp 17   Ht  (1.6 m)   Wt 83.9 kg   SpO2 96%   BMI 32.77 kg/m  Vital signs in last 24 hours: Temp:  [97.5 F (36.4 C)-98.9 F (37.2 C)] 98.7 F (37.1 C) (03/12 0753) Pulse Rate:  [75-120] 75 (03/12 0753) Resp:  [9-24] 17 (03/12 0753) BP: (92-155)/(54-104) 92/54 (03/12 0753) SpO2:  [87 %-100 %] 96 % (03/12 0531) Weight:  [71.2 kg-83.9 kg] 83.9 kg (03/12 0500)  Intake/Output from previous day: 03/11 0701 - 03/12 0700 In: 1092 [IV Piggyback:1092] Out: 800 [Urine:800] Intake/Output this shift: No intake/output data recorded. Nutritional status:  Diet Order            Diet regular Room service appropriate? Yes; Fluid consistency: Thin  Diet effective now             Neurologic Exam: Neurological Examination Mental Status: Alert, oriented to self and place, not to date/time.  Unable to follow 1 step commands consistently  Cranial Nerves: II: Visual fields grossly normal III,IV, VI: ptosis not present, extra-ocular motions intact bilaterally pupils equal, round, reactive to light and accommodation V,VII: smile symmetric, unable to evaluate sensation VIII: hearing normal bilaterally IX,X: soft palate rises symmetrically XII: midline tongue extension Motor: Moves all 4 extremities freely against gravity, however not able to fully evaluate due to lack of cooperation Tone and bulk:normal tone throughout; no atrophy noted Plantars: Right: upgoing   Left:  upgoing Cerebellar: Unable to assess  Lab Results: Results for orders placed or performed during the hospital encounter of 03/25/18 (from the past 48 hour(s))  Comprehensive metabolic panel     Status: Abnormal   Collection Time: 03/25/18  3:17 PM  Result Value Ref Range   Sodium 135 135 - 145 mmol/L   Potassium 3.6 3.5 - 5.1 mmol/L   Chloride 104 98 - 111 mmol/L   CO2 15 (L) 22 - 32 mmol/L   Glucose, Bld 214 (H) 70 - 99 mg/dL   BUN 10 8 - 23 mg/dL   Creatinine, Ser 1.61 0.61 - 1.24 mg/dL   Calcium 8.8 (L) 8.9 - 10.3 mg/dL   Total Protein 6.7 6.5 - 8.1 g/dL   Albumin 3.9 3.5 - 5.0 g/dL   AST 28 15 - 41 U/L   ALT 20 0 - 44 U/L   Alkaline Phosphatase 71 38 - 126 U/L   Total Bilirubin 0.5 0.3 - 1.2 mg/dL   GFR calc non Af Amer >60 >60 mL/min   GFR calc Af Amer >60 >60 mL/min   Anion gap 16 (H) 5 - 15    Comment: Performed at [facility], [address], [place], [place] [address]  Ethanol     Status: None   Collection Time: 03/25/18  3:17 PM  Result Value Ref Range   Alcohol, Ethyl (B) <10 <10 mg/dL    Comment: (NOTE) Lowest detectable limit for serum alcohol is 10 mg/dL. For medical purposes only. Performed at  [facility], [address], [place], [place] [address]   CBC WITH DIFFERENTIAL     Status: Abnormal   Collection Time: 03/25/18  3:17 PM  Result Value Ref Range   WBC 10.3 4.0 - 10.5 K/uL   RBC 3.99 (L) 4.22 - 5.81 MIL/uL   Hemoglobin 12.1 (L) 13.0 - 17.0 g/dL   HCT 60.4 (L) 54.0 - 98.1 %   MCV 95.0 80.0 - 100.0 fL   MCH 30.3 26.0 - 34.0 pg   MCHC 31.9 30.0 - 36.0 g/dL   RDW 19.1 47.8 - 29.5 %   Platelets 208 150 - 400 K/uL   nRBC 0.0 0.0 - 0.2 %   Neutrophils Relative % 83 %   Neutro Abs 8.5 (H) 1.7 - 7.7 K/uL   Lymphocytes Relative 11 %   Lymphs Abs 1.2 0.7 - 4.0 K/uL   Monocytes Relative 5 %   Monocytes Absolute 0.5 0.1 - 1.0 K/uL   Eosinophils Relative 0 %   Eosinophils Absolute 0.0 0.0 - 0.5 K/uL   Basophils Relative 0 %   Basophils  Absolute 0.0 0.0 - 0.1 K/uL   Immature Granulocytes 1 %   Abs Immature Granulocytes 0.07 0.00 - 0.07 K/uL    Comment: Performed at [facility], [address], [place], [place] [address]  CBG monitoring, ED     Status: Abnormal   Collection Time: 03/25/18  3:29 PM  Result Value Ref Range   Glucose-Capillary 187 (H) 70 - 99 mg/dL  I-stat troponin, ED     Status: None   Collection Time: 03/25/18  3:43 PM  Result Value Ref Range   Troponin i, poc 0.00 0.00 - 0.08 ng/mL   Comment 3            Comment: Due to the release kinetics of cTnI, a negative result within the first hours of the onset of symptoms does not rule out myocardial infarction with certainty. If myocardial infarction is still suspected, repeat the test at appropriate intervals.   I-STAT 7, (LYTES, BLD GAS, ICA, H+H)     Status: Abnormal   Collection Time: 03/25/18  3:59 PM  Result Value Ref Range   pH, Arterial 7.257 (L) 7.350 - 7.450   pCO2 arterial 43.1 32.0 - 48.0 mmHg   pO2, Arterial 117.0 (H) 83.0 - 108.0 mmHg   Bicarbonate 19.2 (L) 20.0 - 28.0 mmol/L   TCO2 20 (L) 22 - 32 mmol/L   O2 Saturation 98.0 %   Acid-base deficit 8.0 (H) 0.0 - 2.0 mmol/L   Sodium 136 135 - 145 mmol/L   Potassium 3.6 3.5 - 5.1 mmol/L   Calcium, Ion 1.22 1.15 - 1.40 mmol/L   HCT 33.0 (L) 39.0 - 52.0 %   Hemoglobin 11.2 (L) 13.0 - 17.0 g/dL   Patient temperature 86.5 F    Collection site RADIAL, ALLEN'S TEST ACCEPTABLE    Drawn by RT    Sample type ARTERIAL   Rapid urine drug screen (hospital performed)     Status: Abnormal   Collection Time: 03/25/18  8:40 PM  Result Value Ref Range   Opiates NONE DETECTED NONE DETECTED   Cocaine NONE DETECTED NONE DETECTED   Benzodiazepines POSITIVE (A) NONE DETECTED   Amphetamines NONE DETECTED NONE DETECTED   Tetrahydrocannabinol NONE DETECTED NONE DETECTED   Barbiturates NONE DETECTED NONE DETECTED    Comment: (NOTE) DRUG SCREEN FOR MEDICAL PURPOSES ONLY.  IF CONFIRMATION IS  NEEDED FOR ANY PURPOSE, NOTIFY LAB  WITHIN 5 DAYS. LOWEST DETECTABLE LIMITS FOR URINE DRUG SCREEN Drug Class                     Cutoff (ng/mL) Amphetamine and metabolites    1000 Barbiturate and metabolites    200 Benzodiazepine                 200 Tricyclics and metabolites     300 Opiates and metabolites        300 Cocaine and metabolites        300 THC                            50 Performed at [facility], [address] [place], [place] [address]   Urinalysis, Routine w reflex microscopic (not at [facility])     Status: Abnormal   Collection Time: 03/25/18  9:30 PM  Result Value Ref Range   Color, Urine YELLOW YELLOW   APPearance CLEAR CLEAR   Specific Gravity, Urine 1.011 1.005 - 1.030   pH 5.0 5.0 - 8.0   Glucose, UA NEGATIVE NEGATIVE mg/dL   Hgb urine dipstick NEGATIVE NEGATIVE   Bilirubin Urine NEGATIVE NEGATIVE   Ketones, ur 5 (A) NEGATIVE mg/dL   Protein, ur NEGATIVE NEGATIVE mg/dL   Nitrite NEGATIVE NEGATIVE   Leukocytes,Ua NEGATIVE NEGATIVE    Comment: Performed at [facility], [address], [place], [place] [address]  Magnesium     Status: None   Collection Time: 03/25/18 10:19 PM  Result Value Ref Range   Magnesium 1.9 1.7 - 2.4 mg/dL    Comment: Performed at [facility], [address], [place], [place] [address]  Procalcitonin - Baseline     Status: None   Collection Time: 03/25/18 10:19 PM  Result Value Ref Range   Procalcitonin 0.12 ng/mL    Comment:        Interpretation: PCT (Procalcitonin) <= 0.5 ng/mL: Systemic infection (sepsis) is not likely. Local bacterial infection is possible. (NOTE)       Sepsis PCT Algorithm           Lower Respiratory Tract                                      Infection PCT Algorithm    ----------------------------     ----------------------------         PCT < 0.25 ng/mL                PCT < 0.10 ng/mL         Strongly encourage             Strongly discourage   discontinuation of antibiotics     initiation of antibiotics    ----------------------------     -----------------------------       PCT 0.25 - 0.50 ng/mL            PCT 0.10 - 0.25 ng/mL               OR       >80% decrease in PCT            Discourage initiation of  antibiotics      Encourage discontinuation           of antibiotics    ----------------------------     -----------------------------         PCT >= 0.50 ng/mL              PCT 0.26 - 0.50 ng/mL               AND        <80% decrease in PCT             Encourage initiation of                                             antibiotics       Encourage continuation           of antibiotics    ----------------------------     -----------------------------        PCT >= 0.50 ng/mL                  PCT > 0.50 ng/mL               AND         increase in PCT                  Strongly encourage                                      initiation of antibiotics    Strongly encourage escalation           of antibiotics                                     -----------------------------                                           PCT <= 0.25 ng/mL                                                 OR                                        > 80% decrease in PCT                                     Discontinue / Do not initiate                                             antibiotics Performed at [facility], [address] [place], [place] [address]   Troponin I - Now Then Q6H     Status: Abnormal  Collection Time: 03/25/18 10:19 PM  Result Value Ref Range   Troponin I 0.58 (HH) <0.03 ng/mL    Comment: CRITICAL RESULT CALLED TO, READ BACK BY AND VERIFIED WITH: Cammie Mcgee J,RN 03/25/18 2351 WAYK Performed at [facility], [address], [place], [place] [address]   TSH     Status: None   Collection Time: 03/25/18 10:21 PM  Result Value Ref Range   TSH 0.576 0.350 - 4.500 uIU/mL    Comment: Performed by a 3rd  Generation assay with a functional sensitivity of <=0.01 uIU/mL. Performed at [facility], [address] [place], [place] [address]   Lactic acid, plasma     Status: None   Collection Time: 03/25/18 10:23 PM  Result Value Ref Range   Lactic Acid, Venous 1.9 0.5 - 1.9 mmol/L    Comment: Performed at [facility], [address], [place], [place] [address]  Glucose, capillary     Status: Abnormal   Collection Time: 03/26/18  1:23 AM  Result Value Ref Range   Glucose-Capillary 113 (H) 70 - 99 mg/dL  Hepatic function panel     Status: Abnormal   Collection Time: 03/26/18  2:47 AM  Result Value Ref Range   Total Protein 6.4 (L) 6.5 - 8.1 g/dL   Albumin 3.8 3.5 - 5.0 g/dL   AST 26 15 - 41 U/L   ALT 20 0 - 44 U/L   Alkaline Phosphatase 62 38 - 126 U/L   Total Bilirubin 1.2 0.3 - 1.2 mg/dL   Bilirubin, Direct 0.2 0.0 - 0.2 mg/dL   Indirect Bilirubin 1.0 (H) 0.3 - 0.9 mg/dL    Comment: Performed at [facility], [address], [place], [place] [address]  Basic metabolic panel     Status: Abnormal   Collection Time: 03/26/18  2:47 AM  Result Value Ref Range   Sodium 139 135 - 145 mmol/L   Potassium 3.9 3.5 - 5.1 mmol/L   Chloride 105 98 - 111 mmol/L   CO2 21 (L) 22 - 32 mmol/L   Glucose, Bld 111 (H) 70 - 99 mg/dL   BUN 7 (L) 8 - 23 mg/dL   Creatinine, Ser 2.44 0.61 - 1.24 mg/dL   Calcium 8.5 (L) 8.9 - 10.3 mg/dL   GFR calc non Af Amer >60 >60 mL/min   GFR calc Af Amer >60 >60 mL/min   Anion gap 13 5 - 15    Comment: Performed at [facility], [address] [place], [place] [address]  CBC WITH DIFFERENTIAL     Status: Abnormal   Collection Time: 03/26/18  2:47 AM  Result Value Ref Range   WBC 13.8 (H) 4.0 - 10.5 K/uL   RBC 4.01 (L) 4.22 - 5.81 MIL/uL   Hemoglobin 11.9 (L) 13.0 - 17.0 g/dL   HCT 25.3 (L) 66.4 - 40.3 %   MCV 90.0 80.0 - 100.0 fL   MCH 29.7 26.0 - 34.0 pg   MCHC 33.0 30.0 - 36.0 g/dL   RDW 47.4 25.9 - 56.3 %   Platelets 177 150 - 400  K/uL   nRBC 0.0 0.0 - 0.2 %   Neutrophils Relative % 88 %   Neutro Abs 12.0 (H) 1.7 - 7.7 K/uL   Lymphocytes Relative 7 %   Lymphs Abs 1.0 0.7 - 4.0 K/uL   Monocytes Relative 5 %   Monocytes Absolute 0.7 0.1 - 1.0 K/uL   Eosinophils Relative 0 %  Eosinophils Absolute 0.0 0.0 - 0.5 K/uL   Basophils Relative 0 %   Basophils Absolute 0.0 0.0 - 0.1 K/uL   Immature Granulocytes 0 %   Abs Immature Granulocytes 0.05 0.00 - 0.07 K/uL    Comment: Performed at [facility], [address] [place], [place] [address]  Lactic acid, plasma     Status: None   Collection Time: 03/26/18  2:47 AM  Result Value Ref Range   Lactic Acid, Venous 1.3 0.5 - 1.9 mmol/L    Comment: Performed at [facility], [address] [place], [place] [address]  Troponin I - Now Then Q6H     Status: Abnormal   Collection Time: 03/26/18  2:47 AM  Result Value Ref Range   Troponin I 0.59 (HH) <0.03 ng/mL    Comment: CRITICAL VALUE NOTED.  VALUE IS CONSISTENT WITH PREVIOUSLY REPORTED AND CALLED VALUE. Performed at [facility], [address] [place], [place] [address]   Glucose, capillary     Status: None   Collection Time: 03/26/18  5:28 AM  Result Value Ref Range   Glucose-Capillary 99 70 - 99 mg/dL   Comment 1 Notify RN    Comment 2 Document in Chart   Glucose, capillary     Status: None   Collection Time: 03/26/18  7:50 AM  Result Value Ref Range   Glucose-Capillary 95 70 - 99 mg/dL   Comment 1 Notify RN    Comment 2 Document in Chart   Troponin I - Now Then Q6H     Status: Abnormal   Collection Time: 03/26/18 10:46 AM  Result Value Ref Range   Troponin I 0.34 (HH) <0.03 ng/mL    Comment: CRITICAL VALUE NOTED.  VALUE IS CONSISTENT WITH PREVIOUSLY REPORTED AND CALLED VALUE. Performed at [facility], [address] [place], [place] [address]   Lactic acid, plasma     Status: None   Collection Time: 03/26/18 10:46 AM  Result Value Ref Range   Lactic Acid, Venous 1.2 0.5 - 1.9  mmol/L    Comment: Performed at [facility], [address] [place], [place] [address]  Reticulocytes     Status: Abnormal   Collection Time: 03/26/18 10:46 AM  Result Value Ref Range   Retic Ct Pct 1.3 0.4 - 3.1 %   RBC. 3.75 (L) 4.22 - 5.81 MIL/uL   Retic Count, Absolute 47.2 19.0 - 186.0 K/uL   Immature Retic Fract 9.3 2.3 - 15.9 %    Comment: Performed at [facility], [address] [place], [place] [address]  Glucose, capillary     Status: Abnormal   Collection Time: 03/26/18 11:29 AM  Result Value Ref Range   Glucose-Capillary 139 (H) 70 - 99 mg/dL   Comment 1 Notify RN    Comment 2 Document in Chart     No results found for this or any previous visit (from the past 240 hour(s)).  Lipid Panel No results for input(s): CHOL, TRIG, HDL, CHOLHDL, VLDL, LDLCALC in the last 72 hours.  Studies/Results: Ct Head Wo Contrast  Result Date: 03/25/2018 CLINICAL DATA:  Patient found unresponsive. Status post grand mal seizure. EXAM: CT HEAD WITHOUT CONTRAST TECHNIQUE: Contiguous axial images were obtained from the base of the skull through the vertex without intravenous contrast. COMPARISON:  CT of the head performed 03/08/2018 FINDINGS: Brain: No evidence of acute infarction, hemorrhage, hydrocephalus, extra-axial collection or mass lesion / mass effect. There  is ex vacuo dilatation of the posterior horn of the left lateral ventricle, due to prior ischemic event. Scattered periventricular white matter change likely reflects small vessel ischemic microangiopathy. A small chronic lacunar infarct is noted at the right thalamus. The brainstem and fourth ventricle are within normal limits. The cerebral hemispheres demonstrate grossly normal gray-white differentiation. No mass effect or midline shift is seen. Vascular: No hyperdense vessel or unexpected calcification. Skull: There is no evidence of fracture; visualized osseous structures are unremarkable in appearance. Sinuses/Orbits:  The orbits are within normal limits. There is mild partial opacification of the mastoid air cells bilaterally. The paranasal sinuses and are well-aerated. Other: No significant soft tissue abnormalities are seen. IMPRESSION: 1. No acute intracranial pathology seen on CT. 2. Scattered small vessel ischemic microangiopathy. 3. Small chronic lacunar infarct at the right thalamus. 4. Mild partial opacification of the mastoid air cells bilaterally. Electronically Signed   By: Roanna Raider M.D.   On: 03/25/2018 15:52   Dg Chest Port 1 View  Result Date: 03/25/2018 CLINICAL DATA:  Patient found unresponsive. Altered mental status. Status post grand mal seizure. EXAM: PORTABLE CHEST 1 VIEW COMPARISON:  Chest radiograph performed 03/08/2018 FINDINGS: The lungs are well-aerated. Mild retrocardiac airspace opacity may reflect atelectasis or possibly mild pneumonia, depending on the patient's symptoms. There is no evidence of pleural effusion or pneumothorax. The patient is status post median sternotomy, with evidence of prior CABG. No acute osseous abnormalities are seen. IMPRESSION: Mild retrocardiac airspace opacity may reflect atelectasis or possibly mild pneumonia, depending on the patient's symptoms. Electronically Signed   By: Roanna Raider M.D.   On: 03/25/2018 16:52   Medications: I have reviewed the patient's current medications.  Assessment/Plan: Mr. 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 Overall clinical picture concerning for either prolonged post-ictal period vs subclinical seizures. He is s/p keppra and fosphenytoin loading. 1. CT head wo contrast on arrival w/o acute intracranial pathology, scatttered small vesel ischemic microangiopathy, small chronic lacunar infarct of right thalamus, and mild partial  opacification of the mastoid air cells bilaterally. 2. Per chart review, he had a recent decrease in Keppra dose to 750mg  BID due to concern for increased daytime sleepiness. 3. Appears patient is not back to baseline; he has significant expressive aphasia as well as some receptive aphasia. This could represent worsening of underlying deficits in setting of seizure yesterday or underlying infection vs less likely CVA. 4. Etiology of recurrent seizure yesterday could be 2/2 dose reduction of keppra, lowering of seizure threshold in setting of infection (though aspiration pna likely occurred as result of seizure), vs less likely CVA.  Plan: 1. F/u EEG 2. Consider MRI brain w/o contrast 3. Continue Keppra 1000mg  BID 4. Seizure precautions   LOS: 0 days   Nyra Market, MD PGY3 03/26/2018  11:52 AM   Electronically signed: Dr. Caryl Pina

## 2018-03-26 NOTE — Progress Notes (Signed)
PROGRESS NOTE    [name] Cruz  GNF:[id number] DOB: 01/19/1955 DOA: 03/25/2018 PCP: [name], MD   Brief Narrative:  [name] Cruz is [name] Cruz 63 y.o. male with history of seizure, CAD status post CABG, intracranial bleed, history of stroke with resultant aphasia, paroxysmal atrial fibrillation not on anticoagulation secondary to intracranial hemorrhage, history of upper extremity DVT was found slumped on the driveway.  EMS was called.  After EMS arrival patient was found to be mildly hypoxic in the had Iola Turri generalized tonic-clonic seizure-like activity for which patient was given Versed 5 mg IM.  As per the family patient did not have any chest pain or shortness of breath fever chills nausea vomiting or diarrhea.  Patient Keppra dose was decreased in January 2020 after patient was complaining of lethargy.   Assessment & Plan:   Principal Problem:   Seizure (HCC) Active Problems:   HTN (hypertension)   S/P CABG x 3   Atrial fibrillation (HCC)   Status epilepticus (HCC)   1. Seizure with possible postictal state.   1. Keppra 1000 mg BID.  S/p fosphenytoin in ED. 2. Neurology c/s, appreciate recs 3. EEG pending, consider MRI per neurology recs   2. CAD status post CABG   Elevated Troponin: No reports of chest pain.  Denies this to me today, though Myra Weng bit difficult with his aphasia.  Suspect this was demand ischemia due to above.  EKG from yesterday similar to prior with sinus tach, LAD (has T wave inversion in aVL which was not present on last EKG and ? Q in V2 which also appears new, but otherwise similar). 1. Follow repeat EKG in AM 2. Echo with EF 60-65%, impaired relaxation (no report of WMA).   3. Needs outpatient cards follow up 4. Resume ASA, atorvastatin, coreg  3. Hyperglycemia - A1c is 5.4  4. Possible Community Acquired Pneumonia:  1. Wean O2 as tolerated 2. Narrow zosyn to unasyn  3. Urine strep negative, urine legionella pending  4. Sputum cx 5. PCT low 6. SLP  eval  5. History of paroxysmal atrial fibrillation not on anticoagulation secondary to history of intracranial bleed.  6. History of intracranial bleed.  7. Previous history of upper extremity DVT and hemorrhagic stroke.  8. Normocytic normochromic anemia - 1. Suggestive of iron def.  Normal folate and b12.  2. Start iron, follow CBC 3. retic index, hypoprolif  9. Acute renal failure -resolved  DVT prophylaxis: SCD Code Status: full  Family Communication: discussed with sister over phone Disposition Plan: possibly tomorrow pending neurology s/o   Consultants:   neurology  Procedures:  Echo IMPRESSIONS    1. The left ventricle has normal systolic function with an ejection fraction of 60-65%. The cavity size was normal. Left ventricular diastolic Doppler parameters are consistent with impaired relaxation.  2. The right ventricle has normal systolic function. The cavity was normal. There is no increase in right ventricular wall thickness.  3. The aortic valve is tricuspid Moderate thickening of the aortic valve Moderate calcification of the aortic valve.  4. Pulmonary hypertension is mild.  Antimicrobials:  Anti-infectives (From admission, onward)   Start     Dose/Rate Route Frequency Ordered Stop   03/26/18 0600  piperacillin-tazobactam (ZOSYN) IVPB 3.375 g     3.375 g 12.5 mL/hr over 240 Minutes Intravenous Every 8 hours 03/25/18 2232     03/25/18 2230  piperacillin-tazobactam (ZOSYN) IVPB 3.375 g     3.375 g 100 mL/hr over 30 Minutes Intravenous  Once 03/25/18 2225 03/26/18 0040     Subjective: Aphasic, difficult history. Denies CP. Able to answer some simple questions. Says "I almost died" "I'm ok" Discussed with sister over phone who notes her other sister saw him down in neighbors yard.  EMS called and had seizure like activity with EMS. Thinks he's still [name] Cruz little confused.  Objective: Vitals:   03/26/18 0531 03/26/18 0753 03/26/18 1145 03/26/18 1611  BP:  (!) 94/59 (!) 92/54 101/72 102/65  Pulse: 76 75 76 74  Resp: 18 17 16 17   Temp: 98.9 F (37.2 C) 98.7 F (37.1 C) 99.7 F (37.6 C) 99.1 F (37.3 C)  TempSrc: Oral Oral Oral Oral  SpO2: 96%  98% 93%  Weight:      Height:        Intake/Output Summary (Last 24 hours) at 03/26/2018 1958 Last data filed at 03/26/2018 1800 Gross per 24 hour  Intake 1193.21 ml  Output 800 ml  Net 393.21 ml   Filed Weights   03/25/18 1510 03/26/18 0500  Weight: 71.2 kg 83.9 kg    Examination:  General exam: Appears calm and comfortable  Respiratory system: Clear to auscultation. Respiratory effort normal. Cardiovascular system: S1 & S2 heard, RRR Gastrointestinal system: Abdomen is nondistended, soft and nontender.  Central nervous system: Expressive aphasia.  Difficult to eval given aphasia.  Moving all extremities. Extremities: no LEE Skin: No rashes, lesions or ulcers    Data Reviewed: I have personally reviewed following labs and imaging studies  CBC: Recent Labs  Lab 03/25/18 1517 03/25/18 1559 03/26/18 0247  WBC 10.3  --  13.8*  NEUTROABS 8.5*  --  12.0*  HGB 12.1* 11.2* 11.9*  HCT 37.9* 33.0* 36.1*  MCV 95.0  --  90.0  PLT 208  --  177   Basic Metabolic Panel: Recent Labs  Lab 03/25/18 1517 03/25/18 1559 03/25/18 2219 03/26/18 0247  NA 135 136  --  139  K 3.6 3.6  --  3.9  CL 104  --   --  105  CO2 15*  --   --  21*  GLUCOSE 214*  --   --  111*  BUN 10  --   --  7*  CREATININE 1.17  --   --  0.89  CALCIUM 8.8*  --   --  8.5*  MG  --   --  1.9  --    GFR: Estimated Creatinine Clearance: 82.4 mL/min (by C-G formula based on SCr of 0.89 mg/dL). Liver Function Tests: Recent Labs  Lab 03/25/18 1517 03/26/18 0247  AST 28 26  ALT 20 20  ALKPHOS 71 62  BILITOT 0.5 1.2  PROT 6.7 6.4*  ALBUMIN 3.9 3.8   No results for input(s): LIPASE, AMYLASE in the last 168 hours. No results for input(s): AMMONIA in the last 168 hours. Coagulation Profile: No results for  input(s): INR, PROTIME in the last 168 hours. Cardiac Enzymes: Recent Labs  Lab 03/25/18 2219 03/26/18 0247 03/26/18 1046  TROPONINI 0.58* 0.59* 0.34*   BNP (last 3 results) No results for input(s): PROBNP in the last 8760 hours. HbA1C: Recent Labs    03/26/18 0247  HGBA1C 5.4   CBG: Recent Labs  Lab 03/26/18 0123 03/26/18 0528 03/26/18 0750 03/26/18 1129 03/26/18 1606  GLUCAP 113* 99 95 139* 107*   Lipid Profile: No results for input(s): CHOL, HDL, LDLCALC, TRIG, CHOLHDL, LDLDIRECT in the last 72 hours. Thyroid Function Tests: Recent Labs    03/25/18 2221  TSH 0.576   Anemia Panel: Recent Labs    03/26/18 1046  VITAMINB12 731  FOLATE 32.9  FERRITIN 72  TIBC 301  IRON 22*  RETICCTPCT 1.3   Sepsis Labs: Recent Labs  Lab 03/25/18 2219 03/25/18 2223 03/26/18 0247 03/26/18 1046  PROCALCITON 0.12  --   --   --   LATICACIDVEN  --  1.9 1.3 1.2    No results found for this or any previous visit (from the past 240 hour(s)).       Radiology Studies: Ct Head Wo Contrast  Result Date: 03/25/2018 CLINICAL DATA:  Patient found unresponsive. Status post grand mal seizure. EXAM: CT HEAD WITHOUT CONTRAST TECHNIQUE: Contiguous axial images were obtained from the base of the skull through the vertex without intravenous contrast. COMPARISON:  CT of the head performed 03/08/2018 FINDINGS: Brain: No evidence of acute infarction, hemorrhage, hydrocephalus, extra-axial collection or mass lesion / mass effect. There is ex vacuo dilatation of the posterior horn of the left lateral ventricle, due to prior ischemic event. Scattered periventricular white matter change likely reflects small vessel ischemic microangiopathy. Jameria Bradway small chronic lacunar infarct is noted at the right thalamus. The brainstem and fourth ventricle are within normal limits. The cerebral hemispheres demonstrate grossly normal gray-white differentiation. No mass effect or midline shift is seen. Vascular: No  hyperdense vessel or unexpected calcification. Skull: There is no evidence of fracture; visualized osseous structures are unremarkable in appearance. Sinuses/Orbits: The orbits are within normal limits. There is mild partial opacification of the mastoid air cells bilaterally. The paranasal sinuses and are well-aerated. Other: No significant soft tissue abnormalities are seen. IMPRESSION: 1. No acute intracranial pathology seen on CT. 2. Scattered small vessel ischemic microangiopathy. 3. Small chronic lacunar infarct at the right thalamus. 4. Mild partial opacification of the mastoid air cells bilaterally. Electronically Signed   By: Roanna Raider M.D.   On: 03/25/2018 15:52   Dg Chest Port 1 View  Result Date: 03/25/2018 CLINICAL DATA:  Patient found unresponsive. Altered mental status. Status post grand mal seizure. EXAM: PORTABLE CHEST 1 VIEW COMPARISON:  Chest radiograph performed 03/08/2018 FINDINGS: The lungs are well-aerated. Mild retrocardiac airspace opacity may reflect atelectasis or possibly mild pneumonia, depending on the patient's symptoms. There is no evidence of pleural effusion or pneumothorax. The patient is status post median sternotomy, with evidence of prior CABG. No acute osseous abnormalities are seen. IMPRESSION: Mild retrocardiac airspace opacity may reflect atelectasis or possibly mild pneumonia, depending on the patient's symptoms. Electronically Signed   By: Roanna Raider M.D.   On: 03/25/2018 16:52        Scheduled Meds: Continuous Infusions:  sodium chloride 75 mL/hr at 03/26/18 1800   levETIRAcetam Stopped (03/26/18 0552)   piperacillin-tazobactam (ZOSYN)  IV 12.5 mL/hr at 03/26/18 1800     LOS: 0 days    Time spent: over 30 min    Lacretia Nicks, MD [facility] Pager AMION  If 7PM-7AM, please contact night-coverage [email or web address] Password [facility] 03/26/2018, 7:58 PM

## 2018-03-26 NOTE — H&P (Addendum)
History and Physical    CONNOLLY TREZISE GUR:[id number] DOB: 21-Apr-1955 DOA: 03/25/2018  PCP: [name], MD  Patient coming from: Home.  History obtained from patient's family.  Chief Complaint: Seizure.  HPI: [name] Cruz is a 63 y.o. male with history of seizure, CAD status post CABG, intracranial bleed, history of stroke with resultant aphasia, paroxysmal atrial fibrillation not on anticoagulation secondary to intracranial hemorrhage, history of upper extremity DVT was found slumped on the driveway.  EMS was called.  After EMS arrival patient was found to be mildly hypoxic in the had a generalized tonic-clonic seizure-like activity for which patient was given Versed 5 mg IM.  As per the family patient did not have any chest pain or shortness of breath fever chills nausea vomiting or diarrhea.  Patient Keppra dose was decreased in January 2020 after patient was complaining of lethargy.  ED Course: In the ER CT head was unremarkable for EKG shows sinus tachycardia.  Patient was given Keppra loading dose.  Neurology on-call was consulted.  On exam patient is still not following commands but alert awake.  Moves all extremities.  Pupils are equal and reacting to light.  Lab work showed hyperglycemia.  Anion gap metabolic acidosis and acute renal failure.  Chest x-ray shows possible infiltrates for which patient has been started on empiric antibiotics for possible aspiration.  Review of Systems: As per HPI, rest all negative.   Past Medical History:  Diagnosis Date  . Cognitive impairment 2007   after stroke, saw rehab but told to stop because was too upsetting to him  . History of chicken pox   . HLD (hyperlipidemia)   . HTN (hypertension)   . NSTEMI (non-ST elevated myocardial infarction) (HCC) 02/21/2017  . Obesity   . Stroke, hemorrhagic (HCC) 2007   thought 2/2 HTN (240sbp); residual cognitive impairment, loss of R peripheral field, no driving    Past Surgical History:   Procedure Laterality Date  . ANKLE SURGERY  1990s   right foot with plate and screws  . CORONARY ARTERY BYPASS GRAFT N/A 02/24/2017   3v Procedure: CORONARY ARTERY BYPASS GRAFTING (CABG) x 3 ON PUMP USING LEFT INTERNAL MAMMARY ARTERY TO LEFT ANTERIOR DESENDING CORNARY ARTERY, RIGHT GREATER SAPHENOUS VEIN TO LEFT CIRCUMFLEX ARTERY AND POSTERIOR DESENDING ARTERY. RIGHT GREATER SAPHENOUS VEIN OBTAINED VIA ENDOVEIN HARVEST.;  Surgeon: Delight Ovens, MD  . IABP INSERTION N/A 02/21/2017   Procedure: IABP Insertion;  Surgeon: Yvonne Kendall, MD;  Location: [facility] INVASIVE CV LAB;  Service: Cardiovascular;  Laterality: N/A;  . LEFT HEART CATH AND CORONARY ANGIOGRAPHY N/A 02/21/2017   Procedure: LEFT HEART CATH AND CORONARY ANGIOGRAPHY;  Surgeon: Yvonne Kendall, MD;  Location: [facility] INVASIVE CV LAB;  Service: Cardiovascular;  Laterality: N/A;  . TEE WITHOUT CARDIOVERSION N/A 02/24/2017   Procedure: TRANSESOPHAGEAL ECHOCARDIOGRAM (TEE);  Surgeon: Delight Ovens, MD;  Location: [facility];  Service: Open Heart Surgery;  Laterality: N/A;     reports that he has never smoked. He has quit using smokeless tobacco. He reports previous alcohol use of about 14.0 standard drinks of alcohol per week. He reports that he does not use drugs.  Allergies  Allergen Reactions  . Losartan Other (See Comments)    hyperkalemia    Family History  Problem Relation Age of Onset  . Alzheimer's disease Maternal Grandfather   . Cancer Mother        lymphoma  . Alcohol abuse Father        smoker  .  Coronary artery disease Neg Hx   . Stroke Neg Hx   . Diabetes Neg Hx     Prior to Admission medications   Medication Sig Start Date End Date Taking? Authorizing Provider  aspirin 81 MG EC tablet Take 1 tablet (81 mg total) by mouth daily. 12/26/17  Yes [name], [name], MD  atorvastatin (LIPITOR) 80 MG tablet TAKE 1 TABLET (80 MG TOTAL) BY MOUTH DAILY. FOR CHOLESTEROL Patient taking differently: Take 80 mg by mouth daily.   12/09/17  Yes Corky CraftsVaranasi, Jayadeep S, MD  carvedilol (COREG) 3.125 MG tablet Take 1 tablet (3.125 mg total) by mouth 2 (two) times daily. 12/09/17  Yes Corky CraftsVaranasi, Jayadeep S, MD  Cyanocobalamin (B-12) 1000 MCG SUBL Place 1 tablet under the tongue daily. 12/26/17  Yes [name], [name], MD  levETIRAcetam (KEPPRA) 750 MG tablet Take 1 tablet (750 mg total) by mouth 2 (two) times daily. 03/25/18  Yes Melene PlanFloyd, Dan, DO  Multiple Vitamin (MULTIVITAMIN WITH MINERALS) TABS tablet Take 1 tablet by mouth daily. 12/26/17  Yes [name], [name], MD  sertraline (ZOLOFT) 25 MG tablet Take 0.5 tablets (12.5 mg total) by mouth at bedtime. 12/16/17  Yes [name], [name], MD    Physical Exam: Vitals:   03/25/18 2200 03/25/18 2241 03/26/18 0500 03/26/18 0531  BP: 103/69 (!) 155/104  (!) 94/59  Pulse: 87 (!) 120  76  Resp: 13   18  Temp:    98.9 F (37.2 C)  TempSrc:    Oral  SpO2: 97% (!) 87%  96%  Weight:   83.9 kg   Height:          Constitutional: Moderately built and nourished. Vitals:   03/25/18 2200 03/25/18 2241 03/26/18 0500 03/26/18 0531  BP: 103/69 (!) 155/104  (!) 94/59  Pulse: 87 (!) 120  76  Resp: 13   18  Temp:    98.9 F (37.2 C)  TempSrc:    Oral  SpO2: 97% (!) 87%  96%  Weight:   83.9 kg   Height:       Eyes: Anicteric no pallor. ENMT: No change from the ears eyes nose or mouth. Neck: No mass felt.  No neck rigidity.  No JVD appreciated. Respiratory: No rhonchi or crepitations. Cardiovascular: S1-S2 heard. Abdomen: Soft nontender bowel sounds present. Musculoskeletal no edema.  No joint effusion. Skin: No rash. Neurologic: Alert awake does not follow commands.  Moves all extremities. Psychiatric: Appears confused.   Labs on Admission: I have personally reviewed following labs and imaging studies  CBC: Recent Labs  Lab 03/25/18 1517 03/25/18 1559 03/26/18 0247  WBC 10.3  --  13.8*  NEUTROABS 8.5*  --  12.0*  HGB 12.1* 11.2* 11.9*  HCT 37.9* 33.0* 36.1*  MCV 95.0   --  90.0  PLT 208  --  177   Basic Metabolic Panel: Recent Labs  Lab 03/25/18 1517 03/25/18 1559 03/25/18 2219 03/26/18 0247  NA 135 136  --  139  K 3.6 3.6  --  3.9  CL 104  --   --  105  CO2 15*  --   --  21*  GLUCOSE 214*  --   --  111*  BUN 10  --   --  7*  CREATININE 1.17  --   --  0.89  CALCIUM 8.8*  --   --  8.5*  MG  --   --  1.9  --    GFR: Estimated Creatinine Clearance: 82.4 mL/min (by C-G formula based on  SCr of 0.89 mg/dL). Liver Function Tests: Recent Labs  Lab 03/25/18 1517 03/26/18 0247  AST 28 26  ALT 20 20  ALKPHOS 71 62  BILITOT 0.5 1.2  PROT 6.7 6.4*  ALBUMIN 3.9 3.8   No results for input(s): LIPASE, AMYLASE in the last 168 hours. No results for input(s): AMMONIA in the last 168 hours. Coagulation Profile: No results for input(s): INR, PROTIME in the last 168 hours. Cardiac Enzymes: Recent Labs  Lab 03/25/18 2219 03/26/18 0247  TROPONINI 0.58* 0.59*   BNP (last 3 results) No results for input(s): PROBNP in the last 8760 hours. HbA1C: No results for input(s): HGBA1C in the last 72 hours. CBG: Recent Labs  Lab 03/25/18 1529 03/26/18 0123 03/26/18 0528  GLUCAP 187* 113* 99   Lipid Profile: No results for input(s): CHOL, HDL, LDLCALC, TRIG, CHOLHDL, LDLDIRECT in the last 72 hours. Thyroid Function Tests: Recent Labs    03/25/18 2221  TSH 0.576   Anemia Panel: No results for input(s): VITAMINB12, FOLATE, FERRITIN, TIBC, IRON, RETICCTPCT in the last 72 hours. Urine analysis:    Component Value Date/Time   COLORURINE YELLOW 03/25/2018 2130   APPEARANCEUR CLEAR 03/25/2018 2130   LABSPEC 1.011 03/25/2018 2130   PHURINE 5.0 03/25/2018 2130   GLUCOSEU NEGATIVE 03/25/2018 2130   HGBUR NEGATIVE 03/25/2018 2130   BILIRUBINUR NEGATIVE 03/25/2018 2130   KETONESUR 5 (A) 03/25/2018 2130   PROTEINUR NEGATIVE 03/25/2018 2130   UROBILINOGEN 0.2 04/26/2012 1900   NITRITE NEGATIVE 03/25/2018 2130   LEUKOCYTESUR NEGATIVE 03/25/2018 2130    Sepsis Labs: (procalcitonin:4,lacticidven:4) )No results found for this or any previous visit (from the past 240 hour(s)).   Radiological Exams on Admission: Ct Head Wo Contrast  Result Date: 03/25/2018 CLINICAL DATA:  Patient found unresponsive. Status post grand mal seizure. EXAM: CT HEAD WITHOUT CONTRAST TECHNIQUE: Contiguous axial images were obtained from the base of the skull through the vertex without intravenous contrast. COMPARISON:  CT of the head performed 03/08/2018 FINDINGS: Brain: No evidence of acute infarction, hemorrhage, hydrocephalus, extra-axial collection or mass lesion / mass effect. There is ex vacuo dilatation of the posterior horn of the left lateral ventricle, due to prior ischemic event. Scattered periventricular white matter change likely reflects small vessel ischemic microangiopathy. A small chronic lacunar infarct is noted at the right thalamus. The brainstem and fourth ventricle are within normal limits. The cerebral hemispheres demonstrate grossly normal gray-white differentiation. No mass effect or midline shift is seen. Vascular: No hyperdense vessel or unexpected calcification. Skull: There is no evidence of fracture; visualized osseous structures are unremarkable in appearance. Sinuses/Orbits: The orbits are within normal limits. There is mild partial opacification of the mastoid air cells bilaterally. The paranasal sinuses and are well-aerated. Other: No significant soft tissue abnormalities are seen. IMPRESSION: 1. No acute intracranial pathology seen on CT. 2. Scattered small vessel ischemic microangiopathy. 3. Small chronic lacunar infarct at the right thalamus. 4. Mild partial opacification of the mastoid air cells bilaterally. Electronically Signed   By: Roanna Raider M.D.   On: 03/25/2018 15:52   Dg Chest Port 1 View  Result Date: 03/25/2018 CLINICAL DATA:  Patient found unresponsive. Altered mental status. Status post grand mal seizure. EXAM:  PORTABLE CHEST 1 VIEW COMPARISON:  Chest radiograph performed 03/08/2018 FINDINGS: The lungs are well-aerated. Mild retrocardiac airspace opacity may reflect atelectasis or possibly mild pneumonia, depending on the patient's symptoms. There is no evidence of pleural effusion or pneumothorax. The patient is status post median sternotomy, with evidence  of prior CABG. No acute osseous abnormalities are seen. IMPRESSION: Mild retrocardiac airspace opacity may reflect atelectasis or possibly mild pneumonia, depending on the patient's symptoms. Electronically Signed   By: Roanna Raider M.D.   On: 03/25/2018 16:52    EKG: Independently reviewed.  Sinus tachycardia.  Assessment/Plan Principal Problem:   Seizure (HCC) Active Problems:   HTN (hypertension)   S/P CABG x 3   Atrial fibrillation (HCC)   Status epilepticus (HCC)    1. Seizure with possible postictal state.  Discussed with Dr. Wilford Corner on-call neurologist.  Plan is to give patient fosphenytoin and Keppra IV.  EEG.  Closely monitor in stepdown for now patient will be kept n.p.o. 2. CAD status post CABG -presently n.p.o.  Cycle cardiac markers. 3. Hyperglycemia -check hemoglobin A1c. 4. Possible pneumonia with aspiration for which patient is on Zosyn check urine for Legionella strep antigen.  Check procalcitonin levels.  Sputum culture if patient coughs. 5. History of proximal atrial fibrillation not on anticoagulation secondary to history of intracranial bleed. 6. History of intracranial bleed. 7. Previous history of upper extremity DVT and stroke. 8. Normocytic normochromic anemia -follow CBC.  Check anemia panel. 9. Acute renal failure -gently hydrate follow metabolic panel intake output.   DVT prophylaxis: SCDs. Code Status: Full code. Family Communication: Patient's family. Disposition Plan: Home. Consults called: Neurology. Admission status: Observation.   Eduard Clos MD [facility] Pager [phone].  If  7PM-7AM, please contact night-coverage [email or web address] Password [facility]  03/26/2018, 7:06 AM

## 2018-03-26 NOTE — Progress Notes (Signed)
Pt admitted to 3W35. Alert to self and location with sisters at bedside. Pt reports no pain. Repeats "I had a heart attack". RN reoriented pt, but he continues to repeat this.  On 2L O2 [place]. Seizure pads, floor mats utilized. Will continue to monitor.

## 2018-03-26 NOTE — Evaluation (Signed)
Clinical/Bedside Swallow Evaluation Patient Details  Name: [name] Cruz MRN: [id number] Date of Birth: 1955/03/23  Today's Date: 03/26/2018 Time: SLP Start Time (ACUTE ONLY): 0850 SLP Stop Time (ACUTE ONLY): 0910 SLP Time Calculation (min) (ACUTE ONLY): 20 min  Past Medical History:  Past Medical History:  Diagnosis Date  . Cognitive impairment 2007   after stroke, saw rehab but told to stop because was too upsetting to him  . History of chicken pox   . HLD (hyperlipidemia)   . HTN (hypertension)   . NSTEMI (non-ST elevated myocardial infarction) (HCC) 02/21/2017  . Obesity   . Stroke, hemorrhagic (HCC) 2007   thought 2/2 HTN (240sbp); residual cognitive impairment, loss of R peripheral field, no driving   Past Surgical History:  Past Surgical History:  Procedure Laterality Date  . ANKLE SURGERY  1990s   right foot with plate and screws  . CORONARY ARTERY BYPASS GRAFT N/A 02/24/2017   3v Procedure: CORONARY ARTERY BYPASS GRAFTING (CABG) x 3 ON PUMP USING LEFT INTERNAL MAMMARY ARTERY TO LEFT ANTERIOR DESENDING CORNARY ARTERY, RIGHT GREATER SAPHENOUS VEIN TO LEFT CIRCUMFLEX ARTERY AND POSTERIOR DESENDING ARTERY. RIGHT GREATER SAPHENOUS VEIN OBTAINED VIA ENDOVEIN HARVEST.;  Surgeon: Delight Ovens, MD  . IABP INSERTION N/A 02/21/2017   Procedure: IABP Insertion;  Surgeon: Yvonne Kendall, MD;  Location: [facility];  Service: Cardiovascular;  Laterality: N/A;  . LEFT HEART CATH AND CORONARY ANGIOGRAPHY N/A 02/21/2017   Procedure: LEFT HEART CATH AND CORONARY ANGIOGRAPHY;  Surgeon: Yvonne Kendall, MD;  Location: [facility];  Service: Cardiovascular;  Laterality: N/A;  . TEE WITHOUT CARDIOVERSION N/A 02/24/2017   Procedure: TRANSESOPHAGEAL ECHOCARDIOGRAM (TEE);  Surgeon: Delight Ovens, MD;  Location: [facility] OR;  Service: Open Heart Surgery;  Laterality: N/A;   HPI:  Pt is a 63 y.o. male with history of seizure, CAD status post CABG, intracranial bleed, history of stroke  with resultant aphasia, paroxysmal atrial fibrillation not on anticoagulation secondary to intracranial hemorrhage, history of upper extremity DVT who was found slumped on the driveway. CT of the head was negative for acute patholoogy. The chest x-ray revealed mild retrocardiac airspace opacity may reflect atelectasis or possibly mild pneumonia.    Assessment / Plan / Recommendation Clinical Impression  Pt was seen for bedside swallow evaluation and he reported intake of a regular texture diet without difficulty. Pt demonstrated confusion and exhibited some difficulty following directions but his oral motor function appeared adequate. He tolerated solids and liquids without overt s/sx of aspiration. Mastication time was within functional limits despite reduced dentition and no significant oral residue was noted. Overall, his oropharyngeal swallow mechanism appears to be within functional limits and it is recommended that a regular texture diet with thin liquids be initiated at this time. SLP will follow to ensure tolerance of the recommended diet.  SLP Visit Diagnosis: Dysphagia, unspecified (R13.10)    Aspiration Risk  No limitations    Diet Recommendation Regular;Thin liquid   Liquid Administration via: Cup;Straw Medication Administration: Whole meds with puree Supervision: Patient able to self feed Compensations: Slow rate;Small sips/bites Postural Changes: Seated upright at 90 degrees    Other  Recommendations Oral Care Recommendations: Oral care BID   Follow up Recommendations Other (comment)(Pending progress with treatment)      Frequency and Duration min 2x/week  1 week       Prognosis Prognosis for Safe Diet Advancement: Good Barriers to Reach Goals: Cognitive deficits      Swallow Study  General Date of Onset: 03/25/18 HPI: Pt is a 63 y.o. male with history of seizure, CAD status post CABG, intracranial bleed, history of stroke with resultant aphasia, paroxysmal atrial  fibrillation not on anticoagulation secondary to intracranial hemorrhage, history of upper extremity DVT who was found slumped on the driveway. CT of the head was negative for acute patholoogy. The chest x-ray revealed mild retrocardiac airspace opacity may reflect atelectasis or possibly mild pneumonia.  Type of Study: Bedside Swallow Evaluation Previous Swallow Assessment: None Diet Prior to this Study: NPO Temperature Spikes Noted: No Respiratory Status: Nasal cannula History of Recent Intubation: No Behavior/Cognition: Alert;Cooperative;Confused;Requires cueing Oral Cavity Assessment: Within Functional Limits Oral Care Completed by SLP: No Oral Cavity - Dentition: Other (Comment)(Adequate mandibular; absent maxillary) Vision: Functional for self-feeding Self-Feeding Abilities: Able to feed self Patient Positioning: Upright in bed;Postural control adequate for testing Baseline Vocal Quality: Normal Volitional Cough: Cognitively unable to elicit Volitional Swallow: Able to elicit    Oral/Motor/Sensory Function Overall Oral Motor/Sensory Function: Within functional limits(Pt unable to follow all the commands)   Ice Chips Ice chips: Within functional limits Presentation: Spoon   Thin Liquid Thin Liquid: Within functional limits Presentation: Cup;Straw    Nectar Thick Nectar Thick Liquid: Not tested   Honey Thick Honey Thick Liquid: Not tested   Puree Puree: Within functional limits   Solid     Solid: Within functional limits     [name] Cruz I. Vear Clock, MS, CCC-SLP Acute Rehabilitation Services Office number [phone] Pager [phone]  [name] Cruz 03/26/2018,10:26 AM

## 2018-03-26 NOTE — Progress Notes (Signed)
Pharmacy Antibiotic Note  [name] Cruz is a 63 y.o. male admitted on 03/25/2018 with AMS after seizure and now with concerns for aspiration pneumonia. She was started on Zosyn but abx will be optimized to Unasyn tonight. Procalcitonin is low and urinary strep is neg.   Plan:  Dc Zosyn Unasyn 3g IV q6  Height: 5\' 3"  (160 cm) Weight: 185 lb (83.9 kg) IBW/kg (Calculated) : 56.9  Temp (24hrs), Avg:99.1 F (37.3 C), Min:98.7 F (37.1 C), Max:99.7 F (37.6 C)  Recent Labs  Lab 03/25/18 1517 03/25/18 2223 03/26/18 0247 03/26/18 1046  WBC 10.3  --  13.8*  --   CREATININE 1.17  --  0.89  --   LATICACIDVEN  --  1.9 1.3 1.2    Estimated Creatinine Clearance: 82.4 mL/min (by C-G formula based on SCr of 0.89 mg/dL).    Allergies  Allergen Reactions  . Losartan Other (See Comments)    hyperkalemia    Antimicrobials this admission: Zosyn 3/11 >>   Dose adjustments this admission: N/A  Microbiology results: 3/11 UCx:  pending   Ulyses Southward, PharmD, BCIDP, AAHIVP, CPP Infectious Disease Pharmacist 03/26/2018 8:24 PM

## 2018-03-26 NOTE — Progress Notes (Signed)
  Echocardiogram 2D Echocardiogram has been performed.  Gerda Diss 03/26/2018, 2:12 PM

## 2018-03-27 ENCOUNTER — Observation Stay (HOSPITAL_COMMUNITY): Payer: Medicare Other

## 2018-03-27 DIAGNOSIS — D509 Iron deficiency anemia, unspecified: Secondary | ICD-10-CM | POA: Diagnosis present

## 2018-03-27 DIAGNOSIS — Z79899 Other long term (current) drug therapy: Secondary | ICD-10-CM | POA: Diagnosis not present

## 2018-03-27 DIAGNOSIS — Z7982 Long term (current) use of aspirin: Secondary | ICD-10-CM | POA: Diagnosis not present

## 2018-03-27 DIAGNOSIS — I10 Essential (primary) hypertension: Secondary | ICD-10-CM | POA: Diagnosis present

## 2018-03-27 DIAGNOSIS — X58XXXA Exposure to other specified factors, initial encounter: Secondary | ICD-10-CM | POA: Diagnosis present

## 2018-03-27 DIAGNOSIS — Z811 Family history of alcohol abuse and dependence: Secondary | ICD-10-CM | POA: Diagnosis not present

## 2018-03-27 DIAGNOSIS — R569 Unspecified convulsions: Secondary | ICD-10-CM | POA: Diagnosis not present

## 2018-03-27 DIAGNOSIS — Z951 Presence of aortocoronary bypass graft: Secondary | ICD-10-CM | POA: Diagnosis not present

## 2018-03-27 DIAGNOSIS — I480 Paroxysmal atrial fibrillation: Secondary | ICD-10-CM | POA: Diagnosis present

## 2018-03-27 DIAGNOSIS — J189 Pneumonia, unspecified organism: Secondary | ICD-10-CM | POA: Diagnosis not present

## 2018-03-27 DIAGNOSIS — Z86718 Personal history of other venous thrombosis and embolism: Secondary | ICD-10-CM | POA: Diagnosis not present

## 2018-03-27 DIAGNOSIS — I69219 Unspecified symptoms and signs involving cognitive functions following other nontraumatic intracranial hemorrhage: Secondary | ICD-10-CM | POA: Diagnosis not present

## 2018-03-27 DIAGNOSIS — I2510 Atherosclerotic heart disease of native coronary artery without angina pectoris: Secondary | ICD-10-CM | POA: Diagnosis present

## 2018-03-27 DIAGNOSIS — N179 Acute kidney failure, unspecified: Secondary | ICD-10-CM | POA: Diagnosis not present

## 2018-03-27 DIAGNOSIS — G40901 Epilepsy, unspecified, not intractable, with status epilepticus: Secondary | ICD-10-CM | POA: Diagnosis not present

## 2018-03-27 DIAGNOSIS — I69290 Apraxia following other nontraumatic intracranial hemorrhage: Secondary | ICD-10-CM | POA: Diagnosis not present

## 2018-03-27 DIAGNOSIS — Z832 Family history of diseases of the blood and blood-forming organs and certain disorders involving the immune mechanism: Secondary | ICD-10-CM | POA: Diagnosis not present

## 2018-03-27 DIAGNOSIS — E872 Acidosis: Secondary | ICD-10-CM | POA: Diagnosis not present

## 2018-03-27 DIAGNOSIS — Z888 Allergy status to other drugs, medicaments and biological substances status: Secondary | ICD-10-CM | POA: Diagnosis not present

## 2018-03-27 DIAGNOSIS — Z6832 Body mass index (BMI) 32.0-32.9, adult: Secondary | ICD-10-CM | POA: Diagnosis not present

## 2018-03-27 DIAGNOSIS — R739 Hyperglycemia, unspecified: Secondary | ICD-10-CM | POA: Diagnosis present

## 2018-03-27 DIAGNOSIS — I252 Old myocardial infarction: Secondary | ICD-10-CM | POA: Diagnosis not present

## 2018-03-27 DIAGNOSIS — G40401 Other generalized epilepsy and epileptic syndromes, not intractable, with status epilepticus: Secondary | ICD-10-CM | POA: Diagnosis present

## 2018-03-27 DIAGNOSIS — E785 Hyperlipidemia, unspecified: Secondary | ICD-10-CM | POA: Diagnosis present

## 2018-03-27 DIAGNOSIS — I248 Other forms of acute ischemic heart disease: Secondary | ICD-10-CM | POA: Diagnosis not present

## 2018-03-27 DIAGNOSIS — Z820 Family history of epilepsy and other diseases of the nervous system: Secondary | ICD-10-CM | POA: Diagnosis not present

## 2018-03-27 DIAGNOSIS — E669 Obesity, unspecified: Secondary | ICD-10-CM | POA: Diagnosis present

## 2018-03-27 LAB — GLUCOSE, CAPILLARY
Glucose-Capillary: 102 mg/dL — ABNORMAL HIGH (ref 70–99)
Glucose-Capillary: 106 mg/dL — ABNORMAL HIGH (ref 70–99)
Glucose-Capillary: 108 mg/dL — ABNORMAL HIGH (ref 70–99)
Glucose-Capillary: 93 mg/dL (ref 70–99)
Glucose-Capillary: 93 mg/dL (ref 70–99)
Glucose-Capillary: 94 mg/dL (ref 70–99)

## 2018-03-27 LAB — CBC
HCT: 32.1 % — ABNORMAL LOW (ref 39.0–52.0)
Hemoglobin: 10.7 g/dL — ABNORMAL LOW (ref 13.0–17.0)
MCH: 30.5 pg (ref 26.0–34.0)
MCHC: 33.3 g/dL (ref 30.0–36.0)
MCV: 91.5 fL (ref 80.0–100.0)
Platelets: 153 10*3/uL (ref 150–400)
RBC: 3.51 MIL/uL — ABNORMAL LOW (ref 4.22–5.81)
RDW: 12.7 % (ref 11.5–15.5)
WBC: 8.6 10*3/uL (ref 4.0–10.5)
nRBC: 0 % (ref 0.0–0.2)

## 2018-03-27 LAB — COMPREHENSIVE METABOLIC PANEL
ALBUMIN: 3.2 g/dL — AB (ref 3.5–5.0)
ALT: 17 U/L (ref 0–44)
AST: 22 U/L (ref 15–41)
Alkaline Phosphatase: 48 U/L (ref 38–126)
Anion gap: 9 (ref 5–15)
BILIRUBIN TOTAL: 0.8 mg/dL (ref 0.3–1.2)
BUN: 8 mg/dL (ref 8–23)
CALCIUM: 8.5 mg/dL — AB (ref 8.9–10.3)
CO2: 26 mmol/L (ref 22–32)
Chloride: 106 mmol/L (ref 98–111)
Creatinine, Ser: 0.86 mg/dL (ref 0.61–1.24)
GFR calc Af Amer: >60 mL/min (ref >60)
GFR calc non Af Amer: >60 mL/min (ref >60)
Glucose, Bld: 103 mg/dL — ABNORMAL HIGH (ref 70–99)
Potassium: 3.7 mmol/L (ref 3.5–5.1)
Sodium: 141 mmol/L (ref 135–145)
TOTAL PROTEIN: 5.5 g/dL — AB (ref 6.5–8.1)

## 2018-03-27 LAB — URINE CULTURE: Culture: NO GROWTH

## 2018-03-27 LAB — RAPID URINE DRUG SCREEN, HOSP PERFORMED
Amphetamines: NOT DETECTED
Barbiturates: NOT DETECTED
Benzodiazepines: POSITIVE — AB
Cocaine: NOT DETECTED
OPIATES: NOT DETECTED
Tetrahydrocannabinol: NOT DETECTED

## 2018-03-27 LAB — MAGNESIUM: Magnesium: 1.9 mg/dL (ref 1.7–2.4)

## 2018-03-27 MED ORDER — HALOPERIDOL LACTATE 5 MG/ML IJ SOLN
5.0000 mg | Freq: Once | INTRAMUSCULAR | Status: AC
  Administered 2018-03-27: 5 mg via INTRAVENOUS
  Filled 2018-03-27: qty 1

## 2018-03-27 MED ORDER — AMOXICILLIN-POT CLAVULANATE 875-125 MG PO TABS
1.0000 | ORAL_TABLET | Freq: Two times a day (BID) | ORAL | Status: DC
  Administered 2018-03-27 – 2018-03-29 (×4): 1 via ORAL
  Filled 2018-03-27 (×4): qty 1

## 2018-03-27 MED ORDER — LEVETIRACETAM IN NACL 1500 MG/100ML IV SOLN
1500.0000 mg | Freq: Two times a day (BID) | INTRAVENOUS | Status: DC
  Administered 2018-03-27 – 2018-03-29 (×4): 1500 mg via INTRAVENOUS
  Filled 2018-03-27 (×5): qty 100

## 2018-03-27 MED ORDER — SODIUM CHLORIDE 0.9 % IV SOLN: INTRAVENOUS | Status: DC | PRN

## 2018-03-27 MED ORDER — LEVETIRACETAM IN NACL 1000 MG/100ML IV SOLN
1000.0000 mg | Freq: Once | INTRAVENOUS | Status: AC
  Administered 2018-03-27: 1000 mg via INTRAVENOUS
  Filled 2018-03-27: qty 100

## 2018-03-27 NOTE — Plan of Care (Signed)

## 2018-03-27 NOTE — Progress Notes (Signed)
Portable EEG completed, results pending. 

## 2018-03-27 NOTE — Progress Notes (Signed)
EEG pending.   MRI brain has been ordered.   Electronically signed: Dr. Caryl Pina

## 2018-03-27 NOTE — Progress Notes (Signed)
PROGRESS NOTE    [name] Cruz  ZOX:[id number] DOB: 1955/11/08 DOA: 03/25/2018 PCP: [name], MD   Brief Narrative:  [name] Cruz is [name] Cruz 63 y.o. male with history of seizure, CAD status post CABG, intracranial bleed, history of stroke with resultant aphasia, paroxysmal atrial fibrillation not on anticoagulation secondary to intracranial hemorrhage, history of upper extremity DVT was found slumped on the driveway.  EMS was called.  After EMS arrival patient was found to be mildly hypoxic in the had Ellyce Lafevers generalized tonic-clonic seizure-like activity for which patient was given Versed 5 mg IM.  As per the family patient did not have any chest pain or shortness of breath fever chills nausea vomiting or diarrhea.  Patient Keppra dose was decreased in January 2020 after patient was complaining of lethargy.   Assessment & Plan:   Principal Problem:   Seizure (HCC) Active Problems:   HTN (hypertension)   S/P CABG x 3   Atrial fibrillation (HCC)   Status epilepticus (HCC)   Seizures (HCC)   1. Seizure with possible postictal state.   1. Keppra 1000 mg BID.  S/p fosphenytoin in ED. 2. Neurology c/s, appreciate recs 3. MRI negative for acute infarct or mass, but with chronic hemorrhagic infarct L temporoparietal lobe with significant volume loss.  Also multiple chronic microhemorrages in brain.  Suggestive of poorly controlled hypertension.  Cerebral amyloid could also give this appearance. 4. EEG with 13 second period of rhythmic 2-3 Hz right frontal spike and slow wave activity concerning for electrographic seizure 5. Keppra increased to 1500 mg BID  6. Neurology c/s, appreciate recs  2. Chronic Microhemorrhages in Brain: BP appears well controlled.  Will discuss with neurology need for additional w/u (?cerebral amyloid).   3. CAD status post CABG  Elevated Troponin: No reports of chest pain.  Denies this to me today, though [name] Cruz bit difficult with his aphasia.  Suspect this was demand  ischemia due to above.  EKG from yesterday similar to prior with sinus tach, LAD (has T wave inversion in aVL which was not present on last EKG and ? Q in V2 which also appears new, but otherwise similar). 1. Follow repeat EKG in AM 2. Echo with EF 60-65%, impaired relaxation (no report of WMA).   3. Needs outpatient cards follow up 4. Resume ASA, atorvastatin, coreg  4. Hyperglycemia - A1c is 5.4  5. Possible Community Acquired Pneumonia:  1. Wean O2 as tolerated - now on RA 2. Narrow zosyn to unasyn  - narrow to augmentin, plan for 5 day course 3. Urine strep negative, urine legionella pending  4. Sputum cx 5. PCT low 6. SLP eval  6. History of paroxysmal atrial fibrillation not on anticoagulation secondary to history of intracranial bleed.  7. History of intracranial bleed.  8. Previous history of upper extremity DVT and hemorrhagic stroke.  9. Normocytic normochromic anemia - 1. Suggestive of iron def.  Normal folate and b12.  2. Start iron, follow CBC 3. retic index, hypoprolif  10. Acute renal failure -resolved  DVT prophylaxis: SCD Code Status: full  Family Communication: none at bedside Disposition Plan: pending improvement, neurology s/o.  Requires inpatient treatment given continued seizures and need to adjust aed and for LTM EEG.   Consultants:   neurology  Procedures:  Echo IMPRESSIONS    1. The left ventricle has normal systolic function with an ejection fraction of 60-65%. The cavity size was normal. Left ventricular diastolic Doppler parameters are consistent with impaired relaxation.  2. The right ventricle has normal systolic function. The cavity was normal. There is no increase in right ventricular wall thickness.  3. The aortic valve is tricuspid Moderate thickening of the aortic valve Moderate calcification of the aortic valve.  4. Pulmonary hypertension is mild.  Antimicrobials:  Anti-infectives (From admission, onward)   Start     Dose/Rate  Route Frequency Ordered Stop   03/26/18 2100  Ampicillin-Sulbactam (UNASYN) 3 g in sodium chloride 0.9 % 100 mL IVPB     3 g 200 mL/hr over 30 Minutes Intravenous Every 6 hours 03/26/18 2020     03/26/18 0600  piperacillin-tazobactam (ZOSYN) IVPB 3.375 g  Status:  Discontinued     3.375 g 12.5 mL/hr over 240 Minutes Intravenous Every 8 hours 03/25/18 2232 03/26/18 2020   03/25/18 2230  piperacillin-tazobactam (ZOSYN) IVPB 3.375 g     3.375 g 100 mL/hr over 30 Minutes Intravenous  Once 03/25/18 2225 03/26/18 0040     Subjective: Excited to see me today. I think he's mistaken me for someone else. Denies any issues.   Objective: Vitals:   03/27/18 0008 03/27/18 0418 03/27/18 0802 03/27/18 1257  BP: 104/69 108/72 128/89 (!) 141/99  Pulse: 82 72 86 84  Resp: Temp: 98.7 F (37.1 C) 98.3 F (36.8 C) 98.7 F (37.1 C) 98.5 F (36.9 C)  TempSrc: Oral Oral Oral Oral  SpO2: 93% 97% 97% 97%  Weight:      Height:        Intake/Output Summary (Last 24 hours) at 03/27/2018 1625 Last data filed at 03/27/2018 1528 Gross per 24 hour  Intake 1813.21 ml  Output -  Net 1813.21 ml   Filed Weights   03/25/18 1510 03/26/18 0500  Weight: 71.2 kg 83.9 kg    Examination:  General: No acute distress. Cardiovascular: Heart sounds show Ita Fritzsche regular rate, and rhythm Lungs: Clear to auscultation bilaterally Abdomen: Soft, nontender, nondistended  Neurological: Alert.  Confused.  I think he thinks I'm someone else. Moves all extremities 4. Cranial nerves II through XII grossly intact. Skin: Warm and dry. No rashes or lesions. Extremities: No clubbing or cyanosis. No edema.      Data Reviewed: I have personally reviewed following labs and imaging studies  CBC: Recent Labs  Lab 03/25/18 1517 03/25/18 1559 03/26/18 0247 03/27/18 0502  WBC 10.3  --  13.8* 8.6  NEUTROABS 8.5*  --  12.0*  --   HGB 12.1* 11.2* 11.9* 10.7*  HCT 37.9* 33.0* 36.1* 32.1*  MCV 95.0  --  90.0 91.5   PLT 208  --  177 153   Basic Metabolic Panel: Recent Labs  Lab 03/25/18 1517 03/25/18 1559 03/25/18 2219 03/26/18 0247 03/27/18 0502  NA 135 136  --  139 141  K 3.6 3.6  --  3.9 3.7  CL 104  --   --  105 106  CO2 15*  --   --  21* 26  GLUCOSE 214*  --   --  111* 103*  BUN 10  --   --  7* 8  CREATININE 1.17  --   --  0.89 0.86  CALCIUM 8.8*  --   --  8.5* 8.5*  MG  --   --  1.9  --  1.9   GFR: Estimated Creatinine Clearance: 85.3 mL/min (by C-G formula based on SCr of 0.86 mg/dL). Liver Function Tests: Recent Labs  Lab 03/25/18 1517 03/26/18 0247 03/27/18 0502  AST 28 26  22  ALT 20 20 17   ALKPHOS 71 62 48  BILITOT 0.5 1.2 0.8  PROT 6.7 6.4* 5.5*  ALBUMIN 3.9 3.8 3.2*   No results for input(s): LIPASE, AMYLASE in the last 168 hours. No results for input(s): AMMONIA in the last 168 hours. Coagulation Profile: No results for input(s): INR, PROTIME in the last 168 hours. Cardiac Enzymes: Recent Labs  Lab 03/25/18 2219 03/26/18 0247 03/26/18 1046  TROPONINI 0.58* 0.59* 0.34*   BNP (last 3 results) No results for input(s): PROBNP in the last 8760 hours. HbA1C: Recent Labs    03/26/18 0247  HGBA1C 5.4   CBG: Recent Labs  Lab 03/26/18 2034 03/27/18 0002 03/27/18 0416 03/27/18 0759 03/27/18 1224  GLUCAP 110* 106* 102* 94 108*   Lipid Profile: No results for input(s): CHOL, HDL, LDLCALC, TRIG, CHOLHDL, LDLDIRECT in the last 72 hours. Thyroid Function Tests: Recent Labs    03/25/18 2221  TSH 0.576   Anemia Panel: Recent Labs    03/26/18 1046  VITAMINB12 731  FOLATE 32.9  FERRITIN 72  TIBC 301  IRON 22*  RETICCTPCT 1.3   Sepsis Labs: Recent Labs  Lab 03/25/18 2219 03/25/18 2223 03/26/18 0247 03/26/18 1046  PROCALCITON 0.12  --   --   --   LATICACIDVEN  --  1.9 1.3 1.2    Recent Results (from the past 240 hour(s))  Urine culture     Status: None   Collection Time: 03/25/18  9:33 PM  Result Value Ref Range Status   Specimen  Description URINE, RANDOM  Final   Special Requests NONE  Final   Culture   Final    NO GROWTH Performed at [facility], [address] [place], [place] [address]    Report Status 03/27/2018 FINAL  Final         Radiology Studies: Mr Brain Wo Contrast  Result Date: 03/27/2018 CLINICAL DATA:  Seizure.  History of stroke. EXAM: MRI HEAD WITHOUT CONTRAST TECHNIQUE: Multiplanar, multiecho pulse sequences of the brain and surrounding structures were obtained without intravenous contrast. COMPARISON:  CT head 03/25/2018.  MRI head 08/14/2017 FINDINGS: Brain: Negative for acute infarct. Chronic hemorrhagic infarct in the left temporoparietal lobe with volume loss and enlargement of the atrium, occipital pole and left temporal lobe of the left lateral ventricle. Global atrophy. Scattered areas of chronic microhemorrhage which are better seen on the prior study due to technical differences in the scanner. These are likely related to chronic hypertension. No acute hemorrhage or mass. No edema or midline shift. Vascular: Normal arterial flow voids. Skull and upper cervical spine: Negative Sinuses/Orbits: Mild mucosal edema paranasal sinuses. Bilateral mastoid effusion. Normal orbit Other: None IMPRESSION: Negative for acute infarct or mass Chronic hemorrhagic infarct left temporoparietal lobe with significant volume loss. Multiple chronic microhemorrhages in the brain. Findings are suggestive of poorly controlled hypertension. Cerebral amyloid could also give this appearance. Generalized atrophy Electronically Signed   By: Marlan Palau M.D.   On: 03/27/2018 12:56        Scheduled Meds: . aspirin EC  81 mg Oral Daily  . atorvastatin  80 mg Oral Daily  . carvedilol  3.125 mg Oral BID  . ferrous sulfate  325 mg Oral Q breakfast   Continuous Infusions: . sodium chloride    . ampicillin-sulbactam (UNASYN) IV 3 g (03/27/18 1528)  . levETIRAcetam       LOS: 0 days    Time spent: over 30  min    Elijah Birk  Lowell Guitar, MD [facility] Pager AMION  If 7PM-7AM, please contact night-coverage [email or web address] Password [facility] 03/27/2018, 4:25 PM

## 2018-03-27 NOTE — Progress Notes (Signed)
SLP Cancellation Note  Patient Details Name: [name] Cruz MRN: [id number] DOB: [birth year]   Cancelled treatment:       Reason Eval/Treat Not Completed: Patient at procedure or test/unavailable. Pt off unit for MRI. SLP will re-attempt as able.   Efrata Brunner I. Vear Clock, MS, CCC-SLP Acute Rehabilitation Services Office number [phone] Pager [phone]  Scheryl Marten 03/27/2018, 12:02 PM

## 2018-03-27 NOTE — TOC Initial Note (Signed)
Transition of Care [facility]) - Initial/Assessment Note    Patient Details  Name: [name] Cruz MRN: [id number] Date of Birth: 1955-12-24  Transition of Care [facility]) CM/SW Contact:    Kermit Balo, RN Phone Number: 03/27/2018, 4:08 PM  Clinical Narrative:                 Steward Drone (sister) is his POA. He lives with Diane. Family concerned about him returning with Diane unless he is doing better. Will ask MD about PT/OT.   Expected Discharge Plan: Home w Home Health Services Barriers to Discharge: Continued Medical Work up   Patient Goals and CMS Choice        Expected Discharge Plan and Services Expected Discharge Plan: Home w Home Health Services     Living arrangements for the past 2 months: Mobile Home                          Prior Living Arrangements/Services Living arrangements for the past 2 months: Mobile Home Lives with:: Siblings(sister Diane) Patient language and need for interpreter reviewed:: Yes(none--does have some aphasia) Do you feel safe going back to the place where you live?: Yes      Need for Family Participation in Patient Care: Yes (Comment)(diane) Care giver support system in place?: No (comment)(lives with Diane but if doesnt improve family not sure she can handle him)   Criminal Activity/Legal Involvement Pertinent to Current Situation/Hospitalization: No - Comment as needed  Activities of Daily Living      Permission Sought/Granted                  Emotional Assessment Appearance:: Appears stated age Attitude/Demeanor/Rapport: (hyper ) Affect (typically observed): Restless, Pleasant Orientation: : Oriented to Self Alcohol / Substance Use: Not Applicable Psych Involvement: No (comment)  Admission diagnosis:  Seizure (HCC) [R56.9] Status epilepticus (HCC) [G40.901] Patient Active Problem List   Diagnosis Date Noted  . Seizure (HCC) 03/25/2018  . Status epilepticus (HCC) 03/25/2018  . Hyperkalemia 10/03/2017  . Unilateral  occipital headache 10/03/2017  . Ischemic stroke (HCC) 08/23/2017  . Coronary artery disease involving coronary bypass graft of native heart without angina pectoris   . Atrial fibrillation (HCC)   . Acute deep vein thrombosis (DVT) of left upper extremity (HCC)   . Dysphagia, post-stroke   . Cardiac arrest (HCC) 08/09/2017  . S/P CABG x 3 02/24/2017  . NSTEMI (non-ST elevated myocardial infarction) (HCC) 02/21/2017  . Encephalomalacia 10/28/2012  . Seizure disorder (HCC) 04/26/2012  . Alcohol abuse 04/26/2012  . History of stroke with current residual effects   . Cognitive impairment   . HLD (hyperlipidemia)   . HTN (hypertension)    PCP:  [name], MD Pharmacy:   [facility] [address], [place] - [address] [place] [address] Phone: [phone] Fax: [phone]  [facility] - [place], [place] - [address] [facility] [address] [place] [address] [place] [address] Phone: [phone] Fax: [phone]     Social Determinants of Health (SDOH) Interventions    Readmission Risk Interventions  No flowsheet data found.

## 2018-03-27 NOTE — Progress Notes (Signed)
vLTM EEG running. No skin breakdown.  

## 2018-03-27 NOTE — Progress Notes (Signed)
PT Cancellation Note  Patient Details Name: [name] Cruz MRN: [id number] DOB: 02/08/1955   Cancelled Treatment:    Reason Eval/Treat Not Completed: Patient not medically ready.  Troponins are elevated and still not trending down.  Will hold today and see tomorrow as appropriate. 03/27/2018  Kennebec Bing, PT Acute Rehabilitation Services [phone]  (pager) [phone]  (office)   [name] Cruz 03/27/2018, 6:09 PM

## 2018-03-27 NOTE — Procedures (Signed)
ELECTROENCEPHALOGRAM REPORT   Patient: [name] Cruz       Room #: 8I51G EEG No. ID: [id number] Age: 63 y.o.        Sex: male Referring Physician: Lowell Guitar Report Date:  03/27/2018        Interpreting Physician: Thana Farr  History: [name] Cruz is an 63 y.o. male with a history of seizures, presenting with breakthrough seizure and prolonged encephalopathy  Medications:  Unasyn, ASA, Lipitor, Coreg, Keppra, Ferrous Sulfate  Conditions of Recording:  This is a 21 channel routine scalp EEG performed with bipolar and monopolar montages arranged in accordance to the international 10/20 system of electrode placement. One channel was dedicated to EKG recording.  The patient is in the awake and asleep states.  Description:  The background activity is asynchronous with voltage over the left hemisphere being higher than that noted over the right hemisphere.  During wakefulness the posterior background rhythm consists of a low voltage, symmetrical, fairly well organized, 8 Hz alpha activity, seen from the parieto-occipital and posterior temporal regions.  Low voltage fast activity, poorly organized, is seen anteriorly and is at times superimposed on more posterior regions.  A mixture of theta and alpha rhythms are seen from the central and temporal regions.  There is noted bilateral frontal spike and slow wave activity that is asynchronous.  Phase reversal is noted at [facility] and F8 independently.  Although this sharp activity is more frequent over the left hemisphere there is one 13 second period noted over the right hemisphere when this activity is rhythmic at a frequency of 2-3Hz .  There is no change in the clinical activity during this episode.   Evidence of light sleep is noted as well symmetrical sleep spindles, vertex central sharp transients and irregular slow activity.   Hyperventilation and intermittent photic stimulation were not performed.   IMPRESSION: This is an abnormal  electroencephalogram secondary to independent bifrontal spike and slow wave activity, left greater than right.  Also noted was a 13 second period of rhythmic 2-3 Hz right frontal spike and slow wave activity with no change in clinical status concerning for an electrographic seizure.     Thana Farr, MD Neurology [phone] 03/27/2018, 11:00 AM

## 2018-03-27 NOTE — Progress Notes (Addendum)
EEG reveals independent bifrontal spike and slow wave activity, left greater than right.  Also noted was a 13 second period of rhythmic 2-3 Hz right frontal spike and slow wave activity with no change in clinical status concerning for an electrographic seizure.    A/R: 63 yo male presenting with seizure like activity and prolonged encephalopathy. Overall clinical picture concerning for either prolonged post-ictal period vs subclinical seizures. Was loaded with fosphenytoin and Keppra at time of initial evaluation. Now on scheduled Keppra 1000 mg BID 1. CT head wo contrast on arrival w/o acute intracranial pathology, scatttered small vesel ischemic microangiopathy, small chronic lacunar infarct of right thalamus, and mild partial opacification of the mastoid air cells bilaterally. 2. Per chart review, he had a recent decrease in Keppra dose to 750 mg BID due to concern for increased daytime sleepiness. 3. Etiology of recurrent seizure on Wednesday could be 2/2 dose reduction of keppra, lowering of seizure threshold in setting of infection (though aspiration pna likely occurred as result of seizure), vs less likely CVA. He has significant expressive aphasia as well as some receptive aphasia.  4. EEG this AM reveals a 13 second electrographic seizure  Plan: 1. Will need to monitor with LTM EEG (ordered) 2. MRI brain w/o contrast has been ordered. Will obtain after LTM EEG completed.  3. Increasing Keppra to 1500 mg BID. Supplemental load of 1000 mg IV x 1 has been ordered.  4. Seizure precautions  Electronically signed: Dr. Caryl Pina

## 2018-03-27 NOTE — Progress Notes (Signed)
  Speech Language Pathology Treatment: Dysphagia  Patient Details Name: [name] Cruz MRN: [id number] DOB: September 13, 1955 Today's Date: 03/27/2018 Time: 8242-9980 SLP Time Calculation (min) (ACUTE ONLY): 14 min  Assessment / Plan / Recommendation Clinical Impression  Pt was seen during lunch for dysphagia treatment to assess tolerance of the current diet. He consumed a meal of macaroni and cheese, broccoli, fish, a roll, fresh salad, potato chips, and fruits. He tolerated all solids and liquids without overt s/sx of aspiration. Mastication time was within functional limits and no significant oral residue was noted. Pt's oropharyngeal swallow mechanism continues to be within normal limits at this time and further swallowing intervention is therefore not clinically indicated.   His mentation appears improved compared to that which was noted during yesterday's evaluation. Per [name] Ravel, RN the pt's family reported expressive language deficits since an aneurysm years prior; however, it is unclear as to whether his current level of functioning is back to baseline. Nursing stated that his family will be here around 1600 today and will be able to provide staff with more information then. A speech/language/cognition evaluation is recommended if he is not at his baseline.    HPI HPI: Pt is a 63 y.o. male with history of seizure, CAD status post CABG, intracranial bleed, history of stroke with resultant aphasia, paroxysmal atrial fibrillation not on anticoagulation secondary to intracranial hemorrhage, history of upper extremity DVT who was found slumped on the driveway. CT of the head was negative for acute patholoogy. The chest x-ray revealed mild retrocardiac airspace opacity may reflect atelectasis or possibly mild pneumonia.       SLP Plan  All goals met;Discharge SLP treatment due to (comment)(Consult SLP for language/cognition if pt not at baseline)       Recommendations  Diet recommendations:  Regular;Thin liquid Liquids provided via: Cup;Straw Medication Administration: Whole meds with puree Supervision: Patient able to self feed Compensations: Slow rate;Small sips/bites Postural Changes and/or Swallow Maneuvers: Seated upright 90 degrees                Oral Care Recommendations: Oral care BID Follow up Recommendations: Other (comment)(Pending progress with treatment) SLP Visit Diagnosis: Dysphagia, unspecified (R13.10) Plan: All goals met;Discharge SLP treatment due to (comment)(Consult SLP for language/cognition if pt not at baseline)       [name] Cruz I. [name] Cruz, [place], [place] Office number [phone] Pager [name] Cruz 03/27/2018, 1:01 PM

## 2018-03-28 LAB — GLUCOSE, CAPILLARY
Glucose-Capillary: 86 mg/dL (ref 70–99)
Glucose-Capillary: 94 mg/dL (ref 70–99)
Glucose-Capillary: 105 mg/dL — ABNORMAL HIGH (ref 70–99)
Glucose-Capillary: 102 mg/dL — ABNORMAL HIGH (ref 70–99)
Glucose-Capillary: 114 mg/dL — ABNORMAL HIGH (ref 70–99)

## 2018-03-28 LAB — CBC
HCT: 37.6 % — ABNORMAL LOW (ref 39.0–52.0)
Hemoglobin: 12.3 g/dL — ABNORMAL LOW (ref 13.0–17.0)
MCH: 29.6 pg (ref 26.0–34.0)
MCHC: 32.7 g/dL (ref 30.0–36.0)
MCV: 90.6 fL (ref 80.0–100.0)
Platelets: 183 10*3/uL (ref 150–400)
RBC: 4.15 MIL/uL — ABNORMAL LOW (ref 4.22–5.81)
RDW: 12.6 % (ref 11.5–15.5)
WBC: 9.5 10*3/uL (ref 4.0–10.5)
nRBC: 0 % (ref 0.0–0.2)

## 2018-03-28 LAB — BASIC METABOLIC PANEL
ANION GAP: 12 (ref 5–15)
BUN: 7 mg/dL — ABNORMAL LOW (ref 8–23)
CO2: 22 mmol/L (ref 22–32)
CREATININE: 0.68 mg/dL (ref 0.61–1.24)
Calcium: 8.8 mg/dL — ABNORMAL LOW (ref 8.9–10.3)
Chloride: 106 mmol/L (ref 98–111)
Glucose, Bld: 95 mg/dL (ref 70–99)
Potassium: 3.6 mmol/L (ref 3.5–5.1)
Sodium: 140 mmol/L (ref 135–145)

## 2018-03-28 LAB — LEGIONELLA PNEUMOPHILA SEROGP 1 UR AG: L. pneumophila Serogp 1 Ur Ag: NEGATIVE

## 2018-03-28 LAB — MAGNESIUM: Magnesium: 1.8 mg/dL (ref 1.7–2.4)

## 2018-03-28 MED ORDER — SODIUM CHLORIDE 0.9 % IV SOLN
200.0000 mg | Freq: Once | INTRAVENOUS | Status: AC
  Administered 2018-03-28: 200 mg via INTRAVENOUS
  Filled 2018-03-28: qty 20

## 2018-03-28 MED ORDER — SODIUM CHLORIDE 0.9 % IV SOLN
100.0000 mg | Freq: Two times a day (BID) | INTRAVENOUS | Status: DC
  Administered 2018-03-28 – 2018-03-29 (×2): 100 mg via INTRAVENOUS
  Filled 2018-03-28 (×3): qty 10

## 2018-03-28 NOTE — Procedures (Signed)
LTM-EEG Report  HISTORY: Continuous video-EEG monitoring performed for 63 year old with possible seizures.  ACQUISITION: International 10-20 system for electrode placement; 18 channels with additional eyes linked to ipsilateral ears and EKG. Additional T1-T2 electrodes were used. Continuous video recording obtained.   EEG NUMBER:  MEDICATIONS:  Day 1: see EMR   DAY #1: from 1349 03/27/18 to 0730 03/28/18   BACKGROUND: An overall medium voltage continuous recording with good spontaneous variability and reactivity. Waking background consisted of a medium voltage 9-10 Hz posterior dominant rhythm bilaterally with low voltage beta activity in the bilateral frontocentral regions and some medium voltage theta activity diffusely. Sleep was captured with normal stage II sleep architecture.  EPILEPTIFORM/PERIODIC ACTIVITY: Frequent left anterior temporal sharp waves (F7 maximal) occurring in isolation or bursts of 2-3Hz , without clear clinical signs.  SEIZURES: No EVENTS: none reported  EKG: no significant arrhythmia  SUMMARY: This was an abnormal continuous video EEG due to focal left temporal epileptiform discharges, consistent with a seizure disorder of focal origin. No discrete seizures were seen, however epileptiform activity occurred frequently.

## 2018-03-28 NOTE — Progress Notes (Signed)
LTM EEG checked, no skin breakdown noted.  

## 2018-03-28 NOTE — Progress Notes (Addendum)
NEURO HOSPITALIST PROGRESS NOTE   Subjective: Patient , awake and alert in  Bed, NAD LTM running. Patient displaying some confusion.   Exam: Vitals:   03/28/18 0430 03/28/18 0838  BP: 125/83 (!) 131/95  Pulse: 79 92  Resp: 18 15  Temp: 98.4 F (36.9 C) 98.8 F (37.1 C)  SpO2: 97% 97%    Physical Exam   HEENT-  Normocephalic, no lesions, without obvious abnormality.  Normal external eye and conjunctiva.   Cardiovascular- S1-S2 audible, pulses palpable throughout   Lungs-no rhonchi or wheezing noted, no excessive working breathing.  Saturations within normal limits on RA Extremities- Warm, dry and intact Musculoskeletal-no joint tenderness, deformity or swelling Skin-warm and dry, intact   Neuro:  Mental Status: Alert, oriented to name/ age/ birth date/ city. Does not know president/state/month/year, thought content appropriate.  Speech fluent without evidence of aphasia.  Able to follow  commands without difficulty. Cranial Nerves: II: Visual fields grossly normal,  III,IV, VI: ptosis not present, extra-ocular motions intact bilaterally pupils equal, round, reactive to light and accommodation V,VII: smile symmetric, facial light touch sensation normal bilaterally VIII: hearing normal bilaterally IX,X: uvula rises symmetrically XI: bilateral shoulder shrug XII: midline tongue extension Motor: Right : Upper extremity   5/5   Left:     Upper extremity   5/5  Lower extremity   5/5    Lower extremity   5/5 Tone and bulk:normal tone throughout; no atrophy noted Sensory:  light touch intact throughout, bilaterally Deep Tendon Reflexes: 2+ and symmetric biceps, patella Plantars: Right: downgoing   Left: downgoing Gait: deferred    Medications:  Scheduled: . amoxicillin-clavulanate  1 tablet Oral Q12H  . aspirin EC  81 mg Oral Daily  . atorvastatin  80 mg Oral Daily  . carvedilol  3.125 mg Oral BID  . ferrous sulfate  325 mg Oral Q breakfast    Continuous: . sodium chloride    . levETIRAcetam 1,500 mg (03/28/18 0546)   RAQ:TMAUQJ chloride, acetaminophen **OR** acetaminophen  Pertinent Labs/Diagnostics: LTM 03/28/2018: This was an abnormal continuous video EEG due to focal left temporal epileptiform discharges, consistent with a seizure disorder of focal origin. No discrete seizures were seen, however epileptiform activity occurred frequently.   Mr Brain Wo Contrast  Result Date: 03/27/2018 CLINICAL DATA:  Seizure.  History of stroke. EXAM: MRI HEAD WITHOUT CONTRAST TECHNIQUE: Multiplanar, multiecho pulse sequences of the brain and surrounding structures were obtained without intravenous contrast. COMPARISON:  CT head 03/25/2018.  MRI head 08/14/2017 FINDINGS: Brain: Negative for acute infarct. Chronic hemorrhagic infarct in the left temporoparietal lobe with volume loss and enlargement of the atrium, occipital pole and left temporal lobe of the left lateral ventricle. Global atrophy. Scattered areas of chronic microhemorrhage which are better seen on the prior study due to technical differences in the scanner. These are likely related to chronic hypertension. No acute hemorrhage or mass. No edema or midline shift. Vascular: Normal arterial flow voids. Skull and upper cervical spine: Negative Sinuses/Orbits: Mild mucosal edema paranasal sinuses. Bilateral mastoid effusion. Normal orbit Other: None IMPRESSION: Negative for acute infarct or mass Chronic hemorrhagic infarct left temporoparietal lobe with significant volume loss. Multiple chronic microhemorrhages in the brain. Findings are suggestive of poorly controlled hypertension. Cerebral amyloid could also give this appearance. Generalized atrophy Electronically Signed   By: Marlan Palau M.D.   On: 03/27/2018 12:56  A/R: 64 yo male presenting with seizure like activity andprolonged encephalopathy. Overall clinical pictureconcerning for either prolonged post-ictal period vs  subclinical seizures. Was loaded with fosphenytoin and Keppra at time of initial evaluation. Now on scheduled Keppra 1000 mg BID 1. CT head wo contrast on arrival w/o acute intracranial pathology, scatttered small vesel ischemic microangiopathy, small chronic lacunar infarct of right thalamus, and mild partial opacification of the mastoid air cells bilaterally. 2. Per chart review, he had a recent decrease in Keppra dose to 750 mg BID due to concern for increased daytime sleepiness. 3.Etiology of recurrent seizure on Wednesday could be 2/2 dose reduction of keppra, lowering of seizure threshold in setting of infection (though aspiration pna likely occurred as result of seizure), vs less likely CVA. He has significant expressive aphasia as well as somereceptiveaphasia.  4. EEG yesterday revealed a 13 second electrographic seizure, patient was loaded with Keppra 1000mg , and then Keppra was increased to 1500 mg BID on 03/27/2018 5. LTM EEG today, 3/14 is improved. The EEG showed focal left temporal epileptiform discharges, consistent with a seizure disorder of focal origin. No discrete seizures were seen, an improvement since yesterday. However, epileptiform activity occurred frequently. Of note, LUE twitching was seen again today. Will need to add Vimpat to his regimen.   Plan: -- continue LTM  -- Continue Keppra to 1500 mg BID.  -- Added Vimpat 100 mg BID, after 200 mg Vimpat load today, 3/14 (ordered) -- Seizure precautions   Valentina Lucks, MSN, NP-C [facility] [phone]  Electronically signed: Dr. Caryl Pina 03/28/2018, 10:06 AM

## 2018-03-28 NOTE — Progress Notes (Signed)
PROGRESS NOTE    [name] Cruz  ZOX:[id number] DOB: 05-Sep-1955 DOA: 03/25/2018 PCP: [name], MD   Brief Narrative:  [name] Cruz is [name] Cruz 63 y.o. male with history of seizure, CAD status post CABG, intracranial bleed, history of stroke with resultant aphasia, paroxysmal atrial fibrillation not on anticoagulation secondary to intracranial hemorrhage, history of upper extremity DVT was found slumped on the driveway.  EMS was called.  After EMS arrival patient was found to be mildly hypoxic in the had [name] Cruz generalized tonic-clonic seizure-like activity for which patient was given Versed 5 mg IM.  As per the family patient did not have any chest pain or shortness of breath fever chills nausea vomiting or diarrhea.  Patient Keppra dose was decreased in January 2020 after patient was complaining of lethargy.   Assessment & Plan:   Principal Problem:   Seizure (HCC) Active Problems:   HTN (hypertension)   S/P CABG x 3   Atrial fibrillation (HCC)   Status epilepticus (HCC)   Seizures (HCC)   1. Seizure with possible postictal state.   1. Keppra 1000 mg BID.  S/p fosphenytoin in ED. 2. Neurology c/s, appreciate recs 3. MRI negative for acute infarct or mass, but with chronic hemorrhagic infarct L temporoparietal lobe with significant volume loss.  Also multiple chronic microhemorrages in brain.  Suggestive of poorly controlled hypertension.  Cerebral amyloid could also give this appearance. 4. 3/13 EEG with 13 second period of rhythmic 2-3 Hz right frontal spike and slow wave activity concerning for electrographic seizure 5. 3/14 EEG with focal L temporal epileptiform discharges consistent with seizure disorder of focal origin.  No discrete seizures were seen, however epileptiform activity occurred frequently. 6. Keppra increased to 1500 mg BID.  Vimpat 100 mg BID added today. 7. Continue LTM EEG  8. Neurology c/s, appreciate recs  2. Chronic Microhemorrhages in Brain: BP appears well  controlled, fluctuating.  Consider cerebral amyloid.   1. Follow outpatient.  3. CAD status post CABG  Elevated Troponin: No reports of chest pain.  Denies any CP.  Suspect this was demand ischemia due to above.  EKG from yesterday similar to prior with sinus tach, LAD (has T wave inversion in aVL which was not present on last EKG and ? Q in V2 which also appears new, but otherwise similar). 1. Follow repeat EKG (notable for sinus rhythm, T wave inversion in V2, q waves in III, aVF, and V2), this is different from recent EKG's, but similar to EKG from aug 2019.   2. Troponin downtrending.  Given lack of CP at presentation and presentation for seizures above and no WMA on echo, this was likely demand ischemia. 3. Echo with EF 60-65%, impaired relaxation (no report of WMA).   4. Needs outpatient cards follow up 5. Resume ASA, atorvastatin, coreg  4. Hyperglycemia - A1c is 5.4  5. Possible Community Acquired Pneumonia:  1. Wean O2 as tolerated - now on RA 2. Narrow zosyn to unasyn  - narrow to augmentin, plan for 5 day course 3. Urine strep negative, urine legionella pending  4. Sputum cx 5. PCT low 6. SLP eval  6. History of paroxysmal atrial fibrillation not on anticoagulation secondary to history of intracranial bleed.  7. History of intracranial bleed.  8. Previous history of upper extremity DVT and hemorrhagic stroke.  9. Normocytic normochromic anemia - 1. Suggestive of iron def.  Normal folate and b12.  2. Start iron, follow CBC 3. retic index, hypoprolif  10. Acute  renal failure -resolved  DVT prophylaxis: SCD Code Status: full  Family Communication: none at bedside Disposition Plan: pending improvement, neurology s/o.  Requires inpatient treatment given continued seizures and need to adjust aed and for LTM EEG.   Consultants:   neurology  Procedures:  Echo IMPRESSIONS    1. The left ventricle has normal systolic function with an ejection fraction of 60-65%.  The cavity size was normal. Left ventricular diastolic Doppler parameters are consistent with impaired relaxation.  2. The right ventricle has normal systolic function. The cavity was normal. There is no increase in right ventricular wall thickness.  3. The aortic valve is tricuspid Moderate thickening of the aortic valve Moderate calcification of the aortic valve.  4. Pulmonary hypertension is mild.  Antimicrobials:  Anti-infectives (From admission, onward)   Start     Dose/Rate Route Frequency Ordered Stop   03/27/18 2200  amoxicillin-clavulanate (AUGMENTIN) 875-125 MG per tablet 1 tablet     1 tablet Oral Every 12 hours 03/27/18 1637 03/30/18 2159   03/26/18 2100  Ampicillin-Sulbactam (UNASYN) 3 g in sodium chloride 0.9 % 100 mL IVPB  Status:  Discontinued     3 g 200 mL/hr over 30 Minutes Intravenous Every 6 hours 03/26/18 2020 03/27/18 1637   03/26/18 0600  piperacillin-tazobactam (ZOSYN) IVPB 3.375 g  Status:  Discontinued     3.375 g 12.5 mL/hr over 240 Minutes Intravenous Every 8 hours 03/25/18 2232 03/26/18 2020   03/25/18 2230  piperacillin-tazobactam (ZOSYN) IVPB 3.375 g     3.375 g 100 mL/hr over 30 Minutes Intravenous  Once 03/25/18 2225 03/26/18 0040     Subjective: Better cognitively today. [name] Cruz&Ox2. No CP at presentation. Aphasia makes interview difficult, but doing better.  Objective: Vitals:   03/27/18 2350 03/28/18 0430 03/28/18 0838 03/28/18 1143  BP: (!) 158/99 125/83 (!) 131/95 (!) 140/95  Pulse: 79 79 92 84  Resp: 18 18 15 20   Temp: 98.5 F (36.9 C) 98.4 F (36.9 C) 98.8 F (37.1 C) 98.5 F (36.9 C)  TempSrc: Oral Oral Oral Oral  SpO2: 94% 97% 97% 99%  Weight:      Height:        Intake/Output Summary (Last 24 hours) at 03/28/2018 1625 Last data filed at 03/28/2018 1212 Gross per 24 hour  Intake 900 ml  Output 1300 ml  Net -400 ml   Filed Weights   03/25/18 1510 03/26/18 0500  Weight: 71.2 kg 83.9 kg    Examination:  General: No acute  distress. Cardiovascular: Heart sounds show [name] Cruz regular rate, and rhythm. . Lungs: Clear to auscultation bilaterally Abdomen: Soft, nontender, nondistended  Neurological: Alert and oriented 2. Moves all extremities 4. Cranial nerves II through XII grossly intact. Skin: Warm and dry. No rashes or lesions. Extremities: No clubbing or cyanosis. No edema.       Data Reviewed: I have personally reviewed following labs and imaging studies  CBC: Recent Labs  Lab 03/25/18 1517 03/25/18 1559 03/26/18 0247 03/27/18 0502 03/28/18 0346  WBC 10.3  --  13.8* 8.6 9.5  NEUTROABS 8.5*  --  12.0*  --   --   HGB 12.1* 11.2* 11.9* 10.7* 12.3*  HCT 37.9* 33.0* 36.1* 32.1* 37.6*  MCV 95.0  --  90.0 91.5 90.6  PLT 208  --  177 153 183   Basic Metabolic Panel: Recent Labs  Lab 03/25/18 1517 03/25/18 1559 03/25/18 2219 03/26/18 0247 03/27/18 0502 03/28/18 0346  NA 135 136  --  139 141 140  K 3.6 3.6  --  3.9 3.7 3.6  CL 104  --   --  105 106 106  CO2 15*  --   --  21* 26 22  GLUCOSE 214*  --   --  111* 103* 95  BUN 10  --   --  7* 8 7*  CREATININE 1.17  --   --  0.89 0.86 0.68  CALCIUM 8.8*  --   --  8.5* 8.5* 8.8*  MG  --   --  1.9  --  1.9 1.8   GFR: Estimated Creatinine Clearance: 91.7 mL/min (by C-G formula based on SCr of 0.68 mg/dL). Liver Function Tests: Recent Labs  Lab 03/25/18 1517 03/26/18 0247 03/27/18 0502  AST ALT ALKPHOS 71 62 48  BILITOT 0.5 1.2 0.8  PROT 6.7 6.4* 5.5*  ALBUMIN 3.9 3.8 3.2*   No results for input(s): LIPASE, AMYLASE in the last 168 hours. No results for input(s): AMMONIA in the last 168 hours. Coagulation Profile: No results for input(s): INR, PROTIME in the last 168 hours. Cardiac Enzymes: Recent Labs  Lab 03/25/18 2219 03/26/18 0247 03/26/18 1046  TROPONINI 0.58* 0.59* 0.34*   BNP (last 3 results) No results for input(s): PROBNP in the last 8760 hours. HbA1C: Recent Labs    03/26/18 0247  HGBA1C 5.4    CBG: Recent Labs  Lab 03/27/18 1636 03/27/18 2119 03/28/18 0618 03/28/18 0835 03/28/18 1212  GLUCAP 93 93 86 94 105*   Lipid Profile: No results for input(s): CHOL, HDL, LDLCALC, TRIG, CHOLHDL, LDLDIRECT in the last 72 hours. Thyroid Function Tests: Recent Labs    03/25/18 2221  TSH 0.576   Anemia Panel: Recent Labs    03/26/18 1046  VITAMINB12 731  FOLATE 32.9  FERRITIN 72  TIBC 301  IRON 22*  RETICCTPCT 1.3   Sepsis Labs: Recent Labs  Lab 03/25/18 2219 03/25/18 2223 03/26/18 0247 03/26/18 1046  PROCALCITON 0.12  --   --   --   LATICACIDVEN  --  1.9 1.3 1.2    Recent Results (from the past 240 hour(s))  Urine culture     Status: None   Collection Time: 03/25/18  9:33 PM  Result Value Ref Range Status   Specimen Description URINE, RANDOM  Final   Special Requests NONE  Final   Culture   Final    NO GROWTH Performed at [facility], [address] [place], [place] [address]    Report Status 03/27/2018 FINAL  Final         Radiology Studies: Mr Brain Wo Contrast  Result Date: 03/27/2018 CLINICAL DATA:  Seizure.  History of stroke. EXAM: MRI HEAD WITHOUT CONTRAST TECHNIQUE: Multiplanar, multiecho pulse sequences of the brain and surrounding structures were obtained without intravenous contrast. COMPARISON:  CT head 03/25/2018.  MRI head 08/14/2017 FINDINGS: Brain: Negative for acute infarct. Chronic hemorrhagic infarct in the left temporoparietal lobe with volume loss and enlargement of the atrium, occipital pole and left temporal lobe of the left lateral ventricle. Global atrophy. Scattered areas of chronic microhemorrhage which are better seen on the prior study due to technical differences in the scanner. These are likely related to chronic hypertension. No acute hemorrhage or mass. No edema or midline shift. Vascular: Normal arterial flow voids. Skull and upper cervical spine: Negative Sinuses/Orbits: Mild mucosal edema paranasal sinuses. Bilateral  mastoid effusion. Normal orbit Other: None IMPRESSION: Negative for acute infarct or mass Chronic hemorrhagic  infarct left temporoparietal lobe with significant volume loss. Multiple chronic microhemorrhages in the brain. Findings are suggestive of poorly controlled hypertension. Cerebral amyloid could also give this appearance. Generalized atrophy Electronically Signed   By: Marlan Palau M.D.   On: 03/27/2018 12:56        Scheduled Meds: . amoxicillin-clavulanate  1 tablet Oral Q12H  . aspirin EC  81 mg Oral Daily  . atorvastatin  80 mg Oral Daily  . carvedilol  3.125 mg Oral BID  . ferrous sulfate  325 mg Oral Q breakfast   Continuous Infusions: . sodium chloride    . lacosamide (VIMPAT) IV    . levETIRAcetam Stopped (03/28/18 1355)     LOS: 1 day    Time spent: over 30 min    Lacretia Nicks, MD [facility] Pager AMION  If 7PM-7AM, please contact night-coverage [email or web address] Password [facility] 03/28/2018, 4:25 PM

## 2018-03-28 NOTE — Progress Notes (Signed)
LTM EEG checked, no skin breakdown noted on frontal leads. 

## 2018-03-28 NOTE — Evaluation (Signed)
Physical Therapy Evaluation Patient Details Name: [name] Cruz MRN: [id number] DOB: September 01, 1955 Today's Date: 03/28/2018   History of Present Illness  [name] Cruz is a 63 y.o. male with history of seizure, CAD status post CABG, intracranial bleed, history of stroke with resultant aphasia, paroxysmal atrial fibrillation not on anticoagulation secondary to intracranial hemorrhage, history of upper extremity DVT was found slumped on the driveway.  EMS was called.  After EMS arrival patient was found to be mildly hypoxic and then had generalized tonic-clonic seizure-like activity  Clinical Impression  Pt admitted with/for seizure activity.  Presently mobilizing at min guard to supervision level.  Pt currently limited functionally due to the problems listed below.  (see problems list.)  Pt will benefit from PT to maximize function and safety to be able to get home safely with available assist.     Follow Up Recommendations Home health PT;Other (comment)(versus no follow up needed--based on progress.)    Equipment Recommendations  None recommended by PT    Recommendations for Other Services       Precautions / Restrictions Precautions Precautions: Fall;Other (comment)(seizure)      Mobility  Bed Mobility Overal bed mobility: Modified Independent             General bed mobility comments: uses UE's appropriately  Transfers Overall transfer level: Needs assistance   Transfers: Sit to/from Stand Sit to Stand: Supervision         General transfer comment: no UE's needed  Ambulation/Gait Ambulation/Gait assistance: Min guard Gait Distance (Feet): 100 Feet(in the room, limited by EEG leads) Assistive device: None Gait Pattern/deviations: Step-through pattern Gait velocity: moderate   General Gait Details: generally steady with no overt deviation or balance deficit  Stairs            Wheelchair Mobility    Modified Rankin (Stroke Patients Only)        Balance Overall balance assessment: Needs assistance   Sitting balance-Leahy Scale: Good       Standing balance-Leahy Scale: Good                               Pertinent Vitals/Pain Pain Assessment: No/denies pain    Home Living Family/patient expects to be discharged to:: Private residence Living Arrangements: Other relatives(older sister) Available Help at Discharge: Family;Friend(s);Available 24 hours/day Type of Home: Mobile home Home Access: Stairs to enter Entrance Stairs-Rails: Can reach both Entrance Stairs-Number of Steps: 3 Home Layout: One level Home Equipment: None      Prior Function Level of Independence: Independent         Comments: doesn't drive,  works in his garden, independent with basic ADL's     Hand Dominance   Dominant Hand: Right    Extremity/Trunk Assessment   Upper Extremity Assessment Upper Extremity Assessment: Overall WFL for tasks assessed(mildly weaker L UE)    Lower Extremity Assessment Lower Extremity Assessment: Overall WFL for tasks assessed(no focal weaknesses)       Communication   Communication: Expressive difficulties  Cognition Arousal/Alertness: Awake/alert Behavior During Therapy: WFL for tasks assessed/performed(a little impulsive.) Overall Cognitive Status: History of cognitive impairments - at baseline                                        General Comments      Exercises  Assessment/Plan    PT Assessment Patient needs continued PT services  PT Problem List Decreased activity tolerance;Decreased balance;Decreased mobility;Decreased cognition;Decreased safety awareness       PT Treatment Interventions Gait training;Stair training;Functional mobility training;Therapeutic activities;Balance training;Patient/family education    PT Goals (Current goals can be found in the Care Plan section)  Acute Rehab PT Goals Patient Stated Goal: back to garden and PLOF PT Goal  Formulation: With patient Time For Goal Achievement: 04/04/18 Potential to Achieve Goals: Good    Frequency Min 3X/week   Barriers to discharge        Co-evaluation               AM-PAC PT "6 Clicks" Mobility  Outcome Measure Help needed turning from your back to your side while in a flat bed without using bedrails?: None Help needed moving from lying on your back to sitting on the side of a flat bed without using bedrails?: None Help needed moving to and from a bed to a chair (including a wheelchair)?: None Help needed standing up from a chair using your arms (e.g., wheelchair or bedside chair)?: None Help needed to walk in hospital room?: A Little Help needed climbing 3-5 steps with a railing? : A Little 6 Click Score: 22    End of Session   Activity Tolerance: Patient tolerated treatment well Patient left: in bed;with call bell/phone within reach;with family/visitor present Nurse Communication: Mobility status PT Visit Diagnosis: Other symptoms and signs involving the nervous system (R29.898);Difficulty in walking, not elsewhere classified (R26.2)    Time: 4356-8616 PT Time Calculation (min) (ACUTE ONLY): 25 min   Charges:   PT Evaluation $PT Eval Moderate Complexity: 1 Mod PT Treatments $Gait Training: 8-22 mins        03/28/2018  Costilla Bing, PT Acute Rehabilitation Services [phone]  (pager) [phone]  (office)  Eliseo Gum Tiwan Schnitker 03/28/2018, 5:41 PM

## 2018-03-29 LAB — COMPREHENSIVE METABOLIC PANEL
ALT: 16 U/L (ref 0–44)
AST: 17 U/L (ref 15–41)
Albumin: 3.3 g/dL — ABNORMAL LOW (ref 3.5–5.0)
Alkaline Phosphatase: 53 U/L (ref 38–126)
Anion gap: 10 (ref 5–15)
BILIRUBIN TOTAL: 0.7 mg/dL (ref 0.3–1.2)
BUN: 8 mg/dL (ref 8–23)
CO2: 24 mmol/L (ref 22–32)
Calcium: 8.7 mg/dL — ABNORMAL LOW (ref 8.9–10.3)
Chloride: 107 mmol/L (ref 98–111)
Creatinine, Ser: 0.68 mg/dL (ref 0.61–1.24)
GFR calc Af Amer: >60 mL/min (ref >60)
GFR calc non Af Amer: >60 mL/min (ref >60)
Glucose, Bld: 92 mg/dL (ref 70–99)
Potassium: 3.4 mmol/L — ABNORMAL LOW (ref 3.5–5.1)
Sodium: 141 mmol/L (ref 135–145)
TOTAL PROTEIN: 5.8 g/dL — AB (ref 6.5–8.1)

## 2018-03-29 LAB — MAGNESIUM: Magnesium: 1.7 mg/dL (ref 1.7–2.4)

## 2018-03-29 LAB — GLUCOSE, CAPILLARY
GLUCOSE-CAPILLARY: 84 mg/dL (ref 70–99)
Glucose-Capillary: 101 mg/dL — ABNORMAL HIGH (ref 70–99)

## 2018-03-29 LAB — CBC
HCT: 37 % — ABNORMAL LOW (ref 39.0–52.0)
Hemoglobin: 12.4 g/dL — ABNORMAL LOW (ref 13.0–17.0)
MCH: 30.5 pg (ref 26.0–34.0)
MCHC: 33.5 g/dL (ref 30.0–36.0)
MCV: 90.9 fL (ref 80.0–100.0)
Platelets: 183 K/uL (ref 150–400)
RBC: 4.07 MIL/uL — ABNORMAL LOW (ref 4.22–5.81)
RDW: 12.7 % (ref 11.5–15.5)
WBC: 7.1 K/uL (ref 4.0–10.5)
nRBC: 0 % (ref 0.0–0.2)

## 2018-03-29 MED ORDER — LACOSAMIDE 50 MG PO TABS: 100.0000 mg | ORAL_TABLET | Freq: Two times a day (BID) | ORAL | Status: DC

## 2018-03-29 MED ORDER — LEVETIRACETAM 750 MG PO TABS: 1500.0000 mg | ORAL_TABLET | Freq: Two times a day (BID) | ORAL | 0 refills | Status: DC

## 2018-03-29 MED ORDER — LEVETIRACETAM 750 MG PO TABS: 1500.0000 mg | ORAL_TABLET | Freq: Two times a day (BID) | ORAL | Status: DC

## 2018-03-29 MED ORDER — LACOSAMIDE 100 MG PO TABS: 100.0000 mg | ORAL_TABLET | Freq: Two times a day (BID) | ORAL | 0 refills | Status: DC

## 2018-03-29 MED ORDER — FERROUS SULFATE 325 (65 FE) MG PO TABS: 325.0000 mg | ORAL_TABLET | Freq: Every day | ORAL | 0 refills | Status: DC

## 2018-03-29 MED ORDER — POTASSIUM CHLORIDE CRYS ER 20 MEQ PO TBCR
40.0000 meq | EXTENDED_RELEASE_TABLET | Freq: Once | ORAL | Status: AC
  Administered 2018-03-29: 40 meq via ORAL
  Filled 2018-03-29: qty 2

## 2018-03-29 NOTE — TOC Transition Note (Signed)
Transition of Care [facility]) - CM/SW Discharge Note   Patient Details  Name: [name] Cruz MRN: [id number] Date of Birth: [birth year]  Transition of Care [facility]) CM/SW Contact:  Lawerance Sabal, RN Phone Number: 03/29/2018, 1:29 PM   Clinical Narrative:     [name] Cruz w patient's sister who specifically asked for [facility], referral made. She denied needing any additional DME at home. No other CM needs.   Final next level of care: Home w Home Health Services Barriers to Discharge: No Barriers Identified   Patient Goals and CMS Choice     Choice offered to / list presented to : Sibling  Discharge Placement                       Discharge Plan and Services Discharge Planning Services: CM Consult Post Acute Care Choice: Home Health              HH Arranged: RN, PT, OT, Nurse's Aide HH Agency: [facility]   Social Determinants of Health (SDOH) Interventions     Readmission Risk Interventions No flowsheet data found.

## 2018-03-29 NOTE — Progress Notes (Signed)
Information on AVS reviewed with patient and patient given a copy. All lines removed, patient dressed, and belongings packed. Patient was taken via wheelchair to sister's car for discharge.

## 2018-03-29 NOTE — Progress Notes (Signed)
Subjective: Patient has no complaints.   Objective: Current vital signs: BP 106/80 (BP Location: Right Arm)   Pulse 81   Temp 98.3 F (36.8 C) (Oral)   Resp 18   Ht 5\' 3"  (1.6 m)   Wt 83.9 kg   SpO2 95%   BMI 32.77 kg/m  Vital signs in last 24 hours: Temp:  [98.3 F (36.8 C)-99.3 F (37.4 C)] 98.3 F (36.8 C) (03/15 0427) Pulse Rate:  [81-92] 81 (03/15 0427) Resp:  [15-20] 18 (03/15 0427) BP: (106-143)/(78-95) 106/80 (03/15 0427) SpO2:  [95 %-99 %] 95 % (03/15 0427)  Intake/Output from previous day: 03/14 0701 - 03/15 0700 In: 1506.3 [P.O.:1140; IV Piggyback:366.3] Out: 1375 [Urine:1375] Intake/Output this shift: No intake/output data recorded. Nutritional status:  Diet Order            Diet regular Room service appropriate? Yes; Fluid consistency: Thin  Diet effective now             HEENT: [place]/AT Lungs: Respirations unlabored Ext: Warm and well perfused  Neurologic Exam: Ment: Alert. Oriented to "[place]" and "[place]". Appears confused when asked other orientation questions. Speech fluent but perseverates at times. No dysarthria. Able to follow most commands but requires redirection frequently. No hemineglect noted.  CN: PERRL. Right sided visual field cut noted. EOMI with prominent saccadic quality to pursuits noted. Facial temp sensation equal bilaterally. Subtle decreased NL fold on the left. Head midline. Tongue protrudes midline. Motor: 5/5 BUE and BLE. No pronator drift.  Sensory: Intact to FT x 4. Extinction to LLE with DSS. Temp intact to upper extremities.  Cerebellar: No ataxia noted.  Other: No jerking, twitching, nystagmus, stereotyped movements or other seizure-like activity noted.   Lab Results: Results for orders placed or performed during the hospital encounter of 03/25/18 (from the past 48 hour(s))  Glucose, capillary     Status: None   Collection Time: 03/27/18  7:59 AM  Result Value Ref Range   Glucose-Capillary 94 70 - 99 mg/dL   Comment 1 Notify RN    Comment 2 Document in Chart   Glucose, capillary     Status: Abnormal   Collection Time: 03/27/18 12:24 PM  Result Value Ref Range   Glucose-Capillary 108 (H) 70 - 99 mg/dL   Comment 1 Notify RN    Comment 2 Document in Chart   Glucose, capillary     Status: None   Collection Time: 03/27/18  4:36 PM  Result Value Ref Range   Glucose-Capillary 93 70 - 99 mg/dL   Comment 1 Notify RN    Comment 2 Document in Chart   Urine rapid drug screen (hosp performed)     Status: Abnormal   Collection Time: 03/27/18  5:30 PM  Result Value Ref Range   Opiates NONE DETECTED NONE DETECTED   Cocaine NONE DETECTED NONE DETECTED   Benzodiazepines POSITIVE (A) NONE DETECTED   Amphetamines NONE DETECTED NONE DETECTED   Tetrahydrocannabinol NONE DETECTED NONE DETECTED   Barbiturates NONE DETECTED NONE DETECTED    Comment: (NOTE) DRUG SCREEN FOR MEDICAL PURPOSES ONLY.  IF CONFIRMATION IS NEEDED FOR ANY PURPOSE, NOTIFY LAB WITHIN 5 DAYS. LOWEST DETECTABLE LIMITS FOR URINE DRUG SCREEN Drug Class                     Cutoff (ng/mL) Amphetamine and metabolites    1000 Barbiturate and metabolites    200 Benzodiazepine  200 Tricyclics and metabolites     300 Opiates and metabolites        300 Cocaine and metabolites        300 THC                            50 Performed at [facility], [address], [place], [place] [address]   Glucose, capillary     Status: None   Collection Time: 03/27/18  9:19 PM  Result Value Ref Range   Glucose-Capillary 93 70 - 99 mg/dL   Comment 1 Notify RN    Comment 2 Document in Chart   CBC     Status: Abnormal   Collection Time: 03/28/18  3:46 AM  Result Value Ref Range   WBC 9.5 4.0 - 10.5 K/uL   RBC 4.15 (L) 4.22 - 5.81 MIL/uL   Hemoglobin 12.3 (L) 13.0 - 17.0 g/dL   HCT 29.2 (L) 44.6 - 28.6 %   MCV 90.6 80.0 - 100.0 fL   MCH 29.6 26.0 - 34.0 pg   MCHC 32.7 30.0 - 36.0 g/dL   RDW 38.1 77.1 - 16.5 %   Platelets 183  150 - 400 K/uL   nRBC 0.0 0.0 - 0.2 %    Comment: Performed at [facility], [address], [place], [place] [address]  Basic metabolic panel     Status: Abnormal   Collection Time: 03/28/18  3:46 AM  Result Value Ref Range   Sodium 140 135 - 145 mmol/L   Potassium 3.6 3.5 - 5.1 mmol/L   Chloride 106 98 - 111 mmol/L   CO2 22 22 - 32 mmol/L   Glucose, Bld 95 70 - 99 mg/dL   BUN 7 (L) 8 - 23 mg/dL   Creatinine, Ser 3.33 0.61 - 1.24 mg/dL   Calcium 8.8 (L) 8.9 - 10.3 mg/dL   GFR calc non Af Amer >60 >60 mL/min   GFR calc Af Amer >60 >60 mL/min   Anion gap 12 5 - 15    Comment: Performed at [facility], [address], [place], [place] [address]  Magnesium     Status: None   Collection Time: 03/28/18  3:46 AM  Result Value Ref Range   Magnesium 1.8 1.7 - 2.4 mg/dL    Comment: Performed at [facility], [address], [place], [place] [address]  Glucose, capillary     Status: None   Collection Time: 03/28/18  6:18 AM  Result Value Ref Range   Glucose-Capillary 86 70 - 99 mg/dL   Comment 1 Notify RN    Comment 2 Document in Chart   Glucose, capillary     Status: None   Collection Time: 03/28/18  8:35 AM  Result Value Ref Range   Glucose-Capillary 94 70 - 99 mg/dL   Comment 1 Notify RN    Comment 2 Document in Chart   Glucose, capillary     Status: Abnormal   Collection Time: 03/28/18 12:12 PM  Result Value Ref Range   Glucose-Capillary 105 (H) 70 - 99 mg/dL   Comment 1 Notify RN    Comment 2 Document in Chart   Glucose, capillary     Status: Abnormal   Collection Time: 03/28/18  4:25 PM  Result Value Ref Range   Glucose-Capillary 102 (H) 70 - 99 mg/dL   Comment 1 Notify RN    Comment 2 Document  in Chart   Glucose, capillary     Status: Abnormal   Collection Time: 03/28/18  9:25 PM  Result Value Ref Range   Glucose-Capillary 114 (H) 70 - 99 mg/dL   Comment 1 Notify RN    Comment 2 Document in Chart   CBC     Status: Abnormal   Collection Time:  03/29/18  3:57 AM  Result Value Ref Range   WBC 7.1 4.0 - 10.5 K/uL   RBC 4.07 (L) 4.22 - 5.81 MIL/uL   Hemoglobin 12.4 (L) 13.0 - 17.0 g/dL   HCT 86.7 (L) 61.9 - 50.9 %   MCV 90.9 80.0 - 100.0 fL   MCH 30.5 26.0 - 34.0 pg   MCHC 33.5 30.0 - 36.0 g/dL   RDW 32.6 71.2 - 45.8 %   Platelets 183 150 - 400 K/uL   nRBC 0.0 0.0 - 0.2 %    Comment: Performed at [facility], [address] [place], [place] [address]  Comprehensive metabolic panel     Status: Abnormal   Collection Time: 03/29/18  3:57 AM  Result Value Ref Range   Sodium 141 135 - 145 mmol/L   Potassium 3.4 (L) 3.5 - 5.1 mmol/L   Chloride 107 98 - 111 mmol/L   CO2 24 22 - 32 mmol/L   Glucose, Bld 92 70 - 99 mg/dL   BUN 8 8 - 23 mg/dL   Creatinine, Ser 3.82 0.61 - 1.24 mg/dL   Calcium 8.7 (L) 8.9 - 10.3 mg/dL   Total Protein 5.8 (L) 6.5 - 8.1 g/dL   Albumin 3.3 (L) 3.5 - 5.0 g/dL   AST 17 15 - 41 U/L   ALT 16 0 - 44 U/L   Alkaline Phosphatase 53 38 - 126 U/L   Total Bilirubin 0.7 0.3 - 1.2 mg/dL   GFR calc non Af Amer >60 >60 mL/min   GFR calc Af Amer >60 >60 mL/min   Anion gap 10 5 - 15    Comment: Performed at [facility], [address] [place], [place] [address]  Magnesium     Status: None   Collection Time: 03/29/18  3:57 AM  Result Value Ref Range   Magnesium 1.7 1.7 - 2.4 mg/dL    Comment: Performed at [facility], [address] [place], [place] [address]  Glucose, capillary     Status: None   Collection Time: 03/29/18  6:17 AM  Result Value Ref Range   Glucose-Capillary 84 70 - 99 mg/dL   Comment 1 Notify RN    Comment 2 Document in Chart     Recent Results (from the past 240 hour(s))  Urine culture     Status: None   Collection Time: 03/25/18  9:33 PM  Result Value Ref Range Status   Specimen Description URINE, RANDOM  Final   Special Requests NONE  Final   Culture   Final    NO GROWTH Performed at [facility], [address] [place], [place] [address]    Report  Status 03/27/2018 FINAL  Final    Lipid Panel No results for input(s): CHOL, TRIG, HDL, CHOLHDL, VLDL, LDLCALC in the last 72 hours.  Studies/Results: Mr Brain Wo Contrast  Result Date: 03/27/2018 CLINICAL DATA:  Seizure.  History of stroke. EXAM: MRI HEAD WITHOUT CONTRAST TECHNIQUE: Multiplanar, multiecho pulse sequences of the brain and surrounding structures were obtained without intravenous contrast. COMPARISON:  CT head 03/25/2018.  MRI head 08/14/2017  FINDINGS: Brain: Negative for acute infarct. Chronic hemorrhagic infarct in the left temporoparietal lobe with volume loss and enlargement of the atrium, occipital pole and left temporal lobe of the left lateral ventricle. Global atrophy. Scattered areas of chronic microhemorrhage which are better seen on the prior study due to technical differences in the scanner. These are likely related to chronic hypertension. No acute hemorrhage or mass. No edema or midline shift. Vascular: Normal arterial flow voids. Skull and upper cervical spine: Negative Sinuses/Orbits: Mild mucosal edema paranasal sinuses. Bilateral mastoid effusion. Normal orbit Other: None IMPRESSION: Negative for acute infarct or mass Chronic hemorrhagic infarct left temporoparietal lobe with significant volume loss. Multiple chronic microhemorrhages in the brain. Findings are suggestive of poorly controlled hypertension. Cerebral amyloid could also give this appearance. Generalized atrophy Electronically Signed   By: Marlan Palau M.D.   On: 03/27/2018 12:56    Medications:  Scheduled: . amoxicillin-clavulanate  1 tablet Oral Q12H  . aspirin EC  81 mg Oral Daily  . atorvastatin  80 mg Oral Daily  . carvedilol  3.125 mg Oral BID  . ferrous sulfate  325 mg Oral Q breakfast   Continuous: . sodium chloride    . lacosamide (VIMPAT) IV Stopped (03/28/18 2259)  . levETIRAcetam 1,500 mg (03/29/18 0645)    LTM EEG report, 3/15: This wasan abnormal continuous video EEG due to focal  left temporal epileptiform discharges, consistent with a seizure disorder of focal origin. No discrete seizures were seen, and epileptiform activity has improved over the past 24 hours.   Assessment:62 yo male presentingwith seizure like activity andprolonged encephalopathy. Overall clinical pictureconcerning for either prolonged post-ictal period vs subclinical seizures.Was loaded with fosphenytoin and Keppra at time of initial evaluation. Now on scheduled Keppra 1000 mg BID. Today's exam (3/15) is significantly improved relative to the documented examination findings on 3/11. 1. CT head wo contrast on arrival w/o acute intracranial pathology, scatttered small vesel ischemic microangiopathy, small chronic lacunar infarct of right thalamus, and mild partial opacification of the mastoid air cells bilaterally. 2. Per chart review, he had a recent decrease in Keppra dose to 750 mg BID due to concern for increased daytime sleepiness. 3.Etiology of recurrent seizureon Wednesdaycould be 2/2 dose reduction of keppra, lowering of seizure threshold in setting of infection (though aspiration pna likely occurred as result of seizure), vs less likely CVA. He has significant expressive aphasia as well as somereceptiveaphasia. 4. Serial EEGs:  - EEG on Friday revealed a 13 second electrographic seizure, patient was l oaded with Keppra , and then Keppra was increased to 1500 mg BID  - LTM EEG on 3/14 was improved. The EEG showed focal left temporal  epileptiform discharges, consistent with a seizure disorder of focal origin.  No discrete seizures were seen, an improvement since the prior day.   - EEG report from today was further improved, with focal left temporal  epileptiform discharges, consistent with a seizure disorder of focal origin.  No discrete seizures were seen, and epileptiform activity has improved  over the past 24 hours. 5. However, epileptiform activity occurred frequently.Of note, LUE  twitching was seen again today. Will need to add Vimpat was added to his regimen yesterday, after LUE twitching was again seen. 6. On today's exam, no twitching, jerking or other clinical seizure-like activity is seen.    Plan: -- Discontinue LTM  -- ContinueKeppraat 1500 mg BID.  -- Continue Vimpat at 100 mg BID -- Per [facility] statutes, patients with seizures are not  allowed to drive until  they have been seizure-free for six months. Use caution when using heavy equipment or power tools. Avoid working on ladders or at heights. Take showers instead of baths. Ensure the water temperature is not too high on the home water heater. Do not go swimming alone. When caring for infants or small children, sit down when holding, feeding, or changing them to minimize risk of injury to the child in the event you have a seizure. Also, Maintain good sleep hygiene. Avoid alcohol. - Neurology will sign off. Please call if there are additional questions.    LOS: 2 days    signed: Dr. Caryl Pina 03/29/2018  7:47 AM

## 2018-03-29 NOTE — Discharge Summary (Signed)
Physician Discharge Summary  [name] Cruz:[id number] DOB: 1955/03/10 DOA: 03/25/2018  PCP: [name], MD  Admit date: 03/25/2018 Discharge date: 03/29/2018  Time spent: 45 minutes  Recommendations for Outpatient Follow-up:  1. Follow up outpatient CBC/CMP 2. Follow up with neurology outpatient for seizures (new regimen 1500 mg keppra BID and 100 mg vimpat BID) and abnormal MRI (chronic microhemorrhages, poorly controlled HTN vs amyloid) 3. Follow up with cardiology outpatient for elevated troponin, likely demand ischemia 4. Consider repeat CXR outpatient  Discharge Diagnoses:  Principal Problem:   Seizure (HCC) Active Problems:   HTN (hypertension)   S/P CABG x 3   Atrial fibrillation (HCC)   Status epilepticus (HCC)   Seizures (HCC)   Discharge Condition: stable  Diet recommendation: heart healthy  Filed Weights   03/25/18 1510 03/26/18 0500  Weight: 71.2 kg 83.9 kg    History of present illness:  [name] Mcbroom Milleris a 63 y.o.malewithhistory of seizure, CAD status post CABG, intracranial bleed, history of stroke with resultant aphasia, paroxysmal atrial fibrillation not on anticoagulation secondary to intracranial hemorrhage, history of upper extremity DVT was found slumped on the driveway. EMS was called. After EMS arrival patient was found to be mildly hypoxic in the had a generalized tonic-clonic seizure-like activity for which patient was given Versed 5 mg IM. As per the family patient did not have any chest pain or shortness of breath fever chills nausea vomiting or diarrhea. Patient Keppra dose was decreased in January 2020 after patient was complaining of lethargy.  Hospital Course:  1. Seizure with possible postictal state.  1. Discharge with keppra 1500 mg BID and vimpat 100 mg BID 2. Follow up with neurology outpatient 3. Neurology c/s, appreciate recs 4. MRI negative for acute infarct or mass, but with chronic hemorrhagic infarct L  temporoparietal lobe with significant volume loss.  Also multiple chronic microhemorrages in brain.  Suggestive of poorly controlled hypertension.  Cerebral amyloid could also give this appearance. 5. 3/13 EEG with 13 second period of rhythmic 2-3 Hz right frontal spike and slow wave activity concerning for electrographic seizure 6. 3/14 EEG with focal L temporal epileptiform discharges consistent with seizure disorder of focal origin.  No discrete seizures were seen, however epileptiform activity occurred frequently. 7. 3/15 EEG with focal L temporal epileptiform discharges c/w seizure disorder of focal origin.  No discrete seizures were seen and epileptiform activity improved over the past 24 hours.  2. Chronic Microhemorrhages in Brain: BP appears well controlled, fluctuating.  Consider cerebral amyloid.   1. Follow outpatient.  3. CAD status post CABG Elevated Troponin: No reports of chest pain.  Denies any CP.  Suspect this was demand ischemia due to above.  EKG from yesterday similar to prior with sinus tach, LAD (has T wave inversion in aVL which was not present on last EKG and ? Q in V2 which also appears new, but otherwise similar). 1. Follow repeat EKG (notable for sinus rhythm, T wave inversion in V2, q waves in III, aVF, and V2), this is different from recent EKG's, but similar to EKG from aug 2019.   2. Troponin downtrending.  Given lack of CP at presentation and presentation for seizures above and no WMA on echo, this was likely demand ischemia. 3. Echo with EF 60-65%, impaired relaxation (no report of WMA).   4. Needs outpatient cards follow up 5. Resume ASA, atorvastatin, coreg  4. Hyperglycemia- A1c is 5.4  5. Possible Community Acquired Pneumonia:  1. Wean O2 as tolerated -  now on RA 2. Narrow zosyn to unasyn  - narrow to augmentin, plan for 5 day course (completed today) 3. Urine strep negative, urine legionella negative 4. Sputum cx 5. PCT low 6. SLP  eval  6. History of paroxysmal atrial fibrillation not on anticoagulation secondary to history of intracranial bleed.  7. History of intracranial bleed.  8. Previous history of upper extremity DVTand hemorrhagic stroke.  9. Normocytic normochromic anemia- 1. Suggestive of iron def.  Normal folate and b12.  2. Start iron, follow CBC 3. retic index, hypoprolif  10. Acute renal failure-resolved  Procedures: EEG 3/15 SUMMARY: This wasan abnormal continuous video EEG due to focal left temporal epileptiform discharges, consistent with a seizure disorder of focal origin. No discrete seizures were seen, and epileptiform activity has improved over the past 24 hours.  EEG 3/14 SUMMARY: This was an abnormal continuous video EEG due to focal left temporal epileptiform discharges, consistent with a seizure disorder of focal origin. No discrete seizures were seen, however epileptiform activity occurred frequently.   EEG 3/13 IMPRESSION: This is an abnormal electroencephalogram secondary to independent bifrontal spike and slow wave activity, left greater than right.  Also noted was a 13 second period of rhythmic 2-3 Hz right frontal spike and slow wave activity with no change in clinical status concerning for an electrographic seizure.    Echo IMPRESSIONS    1. The left ventricle has normal systolic function with an ejection fraction of 60-65%. The cavity size was normal. Left ventricular diastolic Doppler parameters are consistent with impaired relaxation.  2. The right ventricle has normal systolic function. The cavity was normal. There is no increase in right ventricular wall thickness.  3. The aortic valve is tricuspid Moderate thickening of the aortic valve Moderate calcification of the aortic valve.  4. Pulmonary hypertension is mild.  Consultations:  neurology  Discharge Exam: Vitals:   03/29/18 0752 03/29/18 1309  BP: 117/83 108/88  Pulse: 87 91  Resp:  19  Temp:  98.5 F (36.9 C) 98.9 F (37.2 C)  SpO2: 98% 98%   No complaints. Doing well today. Discussed with sister. Still aphasic, but improved.   General: No acute distress. Cardiovascular: Heart sounds show a regular rate, and rhythm. Lungs: Clear to auscultation bilaterally Abdomen: Soft, nontender, nondistended Neurological: Alert. Moves all extremities 4. Cranial nerves II through XII grossly intact. Skin: Warm and dry. No rashes or lesions. Extremities: No clubbing or cyanosis. No edema.   Discharge Instructions   Discharge Instructions    Call MD for:  difficulty breathing, headache or visual disturbances   Complete by:  As directed    Call MD for:  extreme fatigue   Complete by:  As directed    Call MD for:  hives   Complete by:  As directed    Call MD for:  persistant dizziness or light-headedness   Complete by:  As directed    Call MD for:  persistant nausea and vomiting   Complete by:  As directed    Call MD for:  redness, tenderness, or signs of infection (pain, swelling, redness, odor or green/yellow discharge around incision site)   Complete by:  As directed    Call MD for:  severe uncontrolled pain   Complete by:  As directed    Call MD for:  temperature >100.4   Complete by:  As directed    Diet - low sodium heart healthy   Complete by:  As directed    Discharge instructions  Complete by:  As directed    You were seen for seizures.  Your keppra was increased to 1500 mg twice a day and we added vimpat for your seizures.  Please follow up with your neurologist as an outpatient for your seizure disorder (Dr. Pearlean Brownie).   You had an elevated troponin (heart enzyme), which was likely related to stress from your seizures.  Please follow up with cardiology as an outpatient.   You were started on iron for iron deficiency anemia.  Please follow up with your PCP for further work up.   We treated you for pneumonia here with antibiotics.  Your course is now complete.    You had imaging which showed changes of chronic hypertension vs amyloid on your brain.  Please follow that up with your outpatient neurologist.   Return for new, recurrent, or worsening issues.  Please ask your PCP to request records from this hospitalization so they know what was done and what the next steps will be.  Per [facility] statutes, patients with seizures are not allowed to drive until  they have been seizure-free for six months. Use caution when using heavy equipment or power tools. Avoid working on ladders or at heights. Take showers instead of baths. Ensure the water temperature is not too high on the home water heater. Do not go swimming alone. When caring for infants or small children, sit down when holding, feeding, or changing them to minimize risk of injury to the child in the event you have a seizure. Also, Maintain good sleep hygiene. Avoid alcohol.   Increase activity slowly   Complete by:  As directed      Allergies as of 03/29/2018      Reactions   Losartan Other (See Comments)   hyperkalemia      Medication List    TAKE these medications   aspirin 81 MG EC tablet Take 1 tablet (81 mg total) by mouth daily.   atorvastatin 80 MG tablet Commonly known as:  LIPITOR TAKE 1 TABLET (80 MG TOTAL) BY MOUTH DAILY. FOR CHOLESTEROL What changed:    how much to take  how to take this  when to take this  additional instructions   B-12 1000 MCG Subl Place 1 tablet under the tongue daily.   carvedilol 3.125 MG tablet Commonly known as:  COREG Take 1 tablet (3.125 mg total) by mouth 2 (two) times daily.   ferrous sulfate 325 (65 FE) MG tablet Take 1 tablet (325 mg total) by mouth daily with breakfast for 30 days. Start taking on:  March 30, 2018   Lacosamide 100 MG Tabs Commonly known as:  Vimpat Take 1 tablet (100 mg total) by mouth 2 (two) times daily for 30 days.   levETIRAcetam 750 MG tablet Commonly known as:  KEPPRA Take 2 tablets (1,500  mg total) by mouth 2 (two) times daily for 30 days. What changed:    medication strength  how much to take   multivitamin with minerals Tabs tablet Take 1 tablet by mouth daily.   sertraline 25 MG tablet Commonly known as:  Zoloft Take 0.5 tablets (12.5 mg total) by mouth at bedtime.      Allergies  Allergen Reactions  . Losartan Other (See Comments)    hyperkalemia   Follow-up Information    [name], MD Follow up.   Specialty:  Family Medicine Why:  Call for follow up Contact information: [address] [place] [address] [phone]  Corky Crafts, MD .   Specialties:  Cardiology, Radiology, Interventional Cardiology Contact information: [address] [place] [address] [phone]        Rollene Rotunda, MD Follow up.   Specialty:  Cardiology Why:  Call for cardiology follow up Contact information: [address] [place] [address] [phone]        Micki Riley, MD Follow up.   Specialties:  Neurology, Radiology Why:  Call for neurology follow up Contact information: [address] [place] [address] [phone]            The results of significant diagnostics from this hospitalization (including imaging, microbiology, ancillary and laboratory) are listed below for reference.    Significant Diagnostic Studies: Dg Chest 2 View  Result Date: 03/08/2018 CLINICAL DATA:  Chest pain. Sleepiness. EXAM: CHEST - 2 VIEW COMPARISON:  08/15/2017 FINDINGS: Postsurgical changes from CABG. Cardiomediastinal silhouette is normal. Mediastinal contours appear intact. There is no evidence of focal airspace consolidation, pleural effusion or pneumothorax. Osseous structures are without acute abnormality. Soft tissues are grossly normal. IMPRESSION: No active cardiopulmonary disease. Electronically Signed   By: Ted Mcalpine M.D.   On: 03/08/2018 14:22   Ct Head  Wo Contrast  Result Date: 03/25/2018 CLINICAL DATA:  Patient found unresponsive. Status post grand mal seizure. EXAM: CT HEAD WITHOUT CONTRAST TECHNIQUE: Contiguous axial images were obtained from the base of the skull through the vertex without intravenous contrast. COMPARISON:  CT of the head performed 03/08/2018 FINDINGS: Brain: No evidence of acute infarction, hemorrhage, hydrocephalus, extra-axial collection or mass lesion / mass effect. There is ex vacuo dilatation of the posterior horn of the left lateral ventricle, due to prior ischemic event. Scattered periventricular white matter change likely reflects small vessel ischemic microangiopathy. A small chronic lacunar infarct is noted at the right thalamus. The brainstem and fourth ventricle are within normal limits. The cerebral hemispheres demonstrate grossly normal gray-white differentiation. No mass effect or midline shift is seen. Vascular: No hyperdense vessel or unexpected calcification. Skull: There is no evidence of fracture; visualized osseous structures are unremarkable in appearance. Sinuses/Orbits: The orbits are within normal limits. There is mild partial opacification of the mastoid air cells bilaterally. The paranasal sinuses and are well-aerated. Other: No significant soft tissue abnormalities are seen. IMPRESSION: 1. No acute intracranial pathology seen on CT. 2. Scattered small vessel ischemic microangiopathy. 3. Small chronic lacunar infarct at the right thalamus. 4. Mild partial opacification of the mastoid air cells bilaterally. Electronically Signed   By: Roanna Raider M.D.   On: 03/25/2018 15:52   Ct Head Wo Contrast  Result Date: 03/08/2018 CLINICAL DATA:  Mental status changes. EXAM: CT HEAD WITHOUT CONTRAST TECHNIQUE: Contiguous axial images were obtained from the base of the skull through the vertex without intravenous contrast. COMPARISON:  10/01/2017 FINDINGS: Brain: There is no evidence for acute hemorrhage, hydrocephalus,  mass lesion, or abnormal extra-axial fluid collection. No definite CT evidence for acute infarction. Left temporoparietal encephalomalacia with ex vacuo dilatation of the posterior and temporal horns left lateral ventricle stable in the interval. Diffuse loss of parenchymal volume is consistent with atrophy. Patchy low attenuation in the deep hemispheric and periventricular white matter is nonspecific, but likely reflects chronic microvascular ischemic demyelination. Vascular: No hyperdense vessel or unexpected calcification. Skull: No evidence for fracture. No worrisome lytic or sclerotic lesion. Sinuses/Orbits: The visualized paranasal sinuses and mastoid air cells are clear. Visualized portions of the globes and intraorbital fat  are unremarkable. Other: None. IMPRESSION: 1. Stable. No acute intracranial abnormality. 2. Old left temporoparietal infarct. 3. Atrophy with chronic small vessel white matter ischemic disease. Electronically Signed   By: Kennith CenterEric  Mansell M.D.   On: 03/08/2018 13:45   Mr Brain Wo Contrast  Result Date: 03/27/2018 CLINICAL DATA:  Seizure.  History of stroke. EXAM: MRI HEAD WITHOUT CONTRAST TECHNIQUE: Multiplanar, multiecho pulse sequences of the brain and surrounding structures were obtained without intravenous contrast. COMPARISON:  CT head 03/25/2018.  MRI head 08/14/2017 FINDINGS: Brain: Negative for acute infarct. Chronic hemorrhagic infarct in the left temporoparietal lobe with volume loss and enlargement of the atrium, occipital pole and left temporal lobe of the left lateral ventricle. Global atrophy. Scattered areas of chronic microhemorrhage which are better seen on the prior study due to technical differences in the scanner. These are likely related to chronic hypertension. No acute hemorrhage or mass. No edema or midline shift. Vascular: Normal arterial flow voids. Skull and upper cervical spine: Negative Sinuses/Orbits: Mild mucosal edema paranasal sinuses. Bilateral mastoid  effusion. Normal orbit Other: None IMPRESSION: Negative for acute infarct or mass Chronic hemorrhagic infarct left temporoparietal lobe with significant volume loss. Multiple chronic microhemorrhages in the brain. Findings are suggestive of poorly controlled hypertension. Cerebral amyloid could also give this appearance. Generalized atrophy Electronically Signed   By: Marlan Palauharles  Clark M.D.   On: 03/27/2018 12:56   Dg Chest Port 1 View  Result Date: 03/25/2018 CLINICAL DATA:  Patient found unresponsive. Altered mental status. Status post grand mal seizure. EXAM: PORTABLE CHEST 1 VIEW COMPARISON:  Chest radiograph performed 03/08/2018 FINDINGS: The lungs are well-aerated. Mild retrocardiac airspace opacity may reflect atelectasis or possibly mild pneumonia, depending on the patient's symptoms. There is no evidence of pleural effusion or pneumothorax. The patient is status post median sternotomy, with evidence of prior CABG. No acute osseous abnormalities are seen. IMPRESSION: Mild retrocardiac airspace opacity may reflect atelectasis or possibly mild pneumonia, depending on the patient's symptoms. Electronically Signed   By: Roanna RaiderJeffery  Chang M.D.   On: 03/25/2018 16:52    Microbiology: Recent Results (from the past 240 hour(s))  Urine culture     Status: None   Collection Time: 03/25/18  9:33 PM  Result Value Ref Range Status   Specimen Description URINE, RANDOM  Final   Special Requests NONE  Final   Culture   Final    NO GROWTH Performed at [facility], [address] [place], [place] [id number]    Report Status 03/27/2018 FINAL  Final     Labs: Basic Metabolic Panel: Recent Labs  Lab 03/25/18 1517 03/25/18 1559 03/25/18 2219 03/26/18 0247 03/27/18 0502 03/28/18 0346 03/29/18 0357  NA 135 136  --  139 141 140 141  K 3.6 3.6  --  3.9 3.7 3.6 3.4*  CL 104  --   --  105 106 106 107  CO2 15*  --   --  21* 26 22 24   GLUCOSE 214*  --   --  111* 103* 95 92  BUN 10  --   --  7* 8 7* 8   CREATININE 1.17  --   --  0.89 0.86 0.68 0.68  CALCIUM 8.8*  --   --  8.5* 8.5* 8.8* 8.7*  MG  --   --  1.9  --  1.9 1.8 1.7   Liver Function Tests: Recent Labs  Lab 03/25/18 1517 03/26/18 0247 03/27/18 0502 03/29/18 0357  AST 28 26 22  17  ALT 20 20 17 16   ALKPHOS 71 62 48 53  BILITOT 0.5 1.2 0.8 0.7  PROT 6.7 6.4* 5.5* 5.8*  ALBUMIN 3.9 3.8 3.2* 3.3*   No results for input(s): LIPASE, AMYLASE in the last 168 hours. No results for input(s): AMMONIA in the last 168 hours. CBC: Recent Labs  Lab 03/25/18 1517 03/25/18 1559 03/26/18 0247 03/27/18 0502 03/28/18 0346 03/29/18 0357  WBC 10.3  --  13.8* 8.6 9.5 7.1  NEUTROABS 8.5*  --  12.0*  --   --   --   HGB 12.1* 11.2* 11.9* 10.7* 12.3* 12.4*  HCT 37.9* 33.0* 36.1* 32.1* 37.6* 37.0*  MCV 95.0  --  90.0 91.5 90.6 90.9  PLT 208  --  177 153 183 183   Cardiac Enzymes: Recent Labs  Lab 03/25/18 2219 03/26/18 0247 03/26/18 1046  TROPONINI 0.58* 0.59* 0.34*   BNP: BNP (last 3 results) No results for input(s): BNP in the last 8760 hours.  ProBNP (last 3 results) No results for input(s): PROBNP in the last 8760 hours.  CBG: Recent Labs  Lab 03/28/18 1212 03/28/18 1625 03/28/18 2125 03/29/18 0617 03/29/18 1247  GLUCAP 105* 102* 114* 84 101*       Signed:  Lacretia Nicks MD.  [facility] 03/29/2018, 1:58 PM

## 2018-03-29 NOTE — Procedures (Signed)
LTM-EEG Report  HISTORY: Continuous video-EEG monitoring performed for 63 year old with possible seizures.  ACQUISITION: International 10-20 system for electrode placement; 18 channels with additional eyes linked to ipsilateral ears and EKG. Additional T1-T2 electrodes were used. Continuous video recording obtained.   EEG NUMBER:  MEDICATIONS:  Day 2: see EMR   DAY #2: from 0730 03/28/18 to 0730 03/29/18   BACKGROUND: An overall medium voltage continuous recording with good spontaneous variability and reactivity. Waking background consisted of a medium voltage 9-10 Hz posterior dominant rhythm bilaterally with low voltage beta activity in the bilateral frontocentral regions and some medium voltage theta activity diffusely. Sleep was captured with normal stage II sleep architecture.  EPILEPTIFORM/PERIODIC ACTIVITY: Occasional left anterior temporal sharp waves (F7 maximal) occurring in isolation or bursts of 1-2Hz , without clinical signs. These were improved over the past 24 hours.  SEIZURES: No EVENTS: none reported  EKG: no significant arrhythmia  SUMMARY: This was an abnormal continuous video EEG due to focal left temporal epileptiform discharges, consistent with a seizure disorder of focal origin. No discrete seizures were seen, and epileptiform activity has improved over the past 24 hours.

## 2018-03-30 ENCOUNTER — Telehealth: Payer: Self-pay | Admitting: *Deleted

## 2018-03-30 DIAGNOSIS — I480 Paroxysmal atrial fibrillation: Secondary | ICD-10-CM | POA: Diagnosis not present

## 2018-03-30 DIAGNOSIS — I69218 Other symptoms and signs involving cognitive functions following other nontraumatic intracranial hemorrhage: Secondary | ICD-10-CM | POA: Diagnosis not present

## 2018-03-30 DIAGNOSIS — Z7982 Long term (current) use of aspirin: Secondary | ICD-10-CM | POA: Diagnosis not present

## 2018-03-30 DIAGNOSIS — Z86718 Personal history of other venous thrombosis and embolism: Secondary | ICD-10-CM | POA: Diagnosis not present

## 2018-03-30 DIAGNOSIS — I69220 Aphasia following other nontraumatic intracranial hemorrhage: Secondary | ICD-10-CM | POA: Diagnosis not present

## 2018-03-30 DIAGNOSIS — E785 Hyperlipidemia, unspecified: Secondary | ICD-10-CM | POA: Diagnosis not present

## 2018-03-30 DIAGNOSIS — I252 Old myocardial infarction: Secondary | ICD-10-CM | POA: Diagnosis not present

## 2018-03-30 DIAGNOSIS — I69291 Dysphagia following other nontraumatic intracranial hemorrhage: Secondary | ICD-10-CM | POA: Diagnosis not present

## 2018-03-30 DIAGNOSIS — Z9181 History of falling: Secondary | ICD-10-CM | POA: Diagnosis not present

## 2018-03-30 DIAGNOSIS — I25810 Atherosclerosis of coronary artery bypass graft(s) without angina pectoris: Secondary | ICD-10-CM | POA: Diagnosis not present

## 2018-03-30 DIAGNOSIS — R1310 Dysphagia, unspecified: Secondary | ICD-10-CM | POA: Diagnosis not present

## 2018-03-30 DIAGNOSIS — I10 Essential (primary) hypertension: Secondary | ICD-10-CM | POA: Diagnosis not present

## 2018-03-30 NOTE — Telephone Encounter (Signed)
RN attempted to contact for TCM f/u no answer on home phone , left a message for patient to return call. Also contacted cell phone number no answer and voice mail is not set up.

## 2018-03-30 NOTE — Telephone Encounter (Signed)
Transition Care Management Follow-up Telephone Call   Date discharged?   How have you been since you were released from the hospital? 'I'm okay"   Do you understand why you were in the hospital? Yes I had a stroke    Do you understand the discharge instructions? yes     Where were you discharged to? Home    Items Reviewed:  Medications reviewed: yes  Allergies reviewed: yes  Dietary changes reviewed: N/A   Referrals reviewed: N/A    Functional Questionnaire:   Activities of Daily Living (ADLs):   He states they are independent in the following: ambulation, bathing and hygiene, feeding, continence, grooming, toileting and dressing States they require assistance with the following: N/A   Any transportation issues/concerns?: no   Any patient concerns? no   Confirmed importance and date/time of follow-up visits scheduled yes   Provider Appointment booked with  Confirmed with patient if condition begins to worsen call PCP or go to the ER.  Patient was given the office number and encouraged to call back with question or concerns.  : yes  ** RN tried to book appointment. Patient informed RN his sister makes his appointments. Called the sister and no answer, left a message for return call.

## 2018-03-31 DIAGNOSIS — I25810 Atherosclerosis of coronary artery bypass graft(s) without angina pectoris: Secondary | ICD-10-CM | POA: Diagnosis not present

## 2018-03-31 DIAGNOSIS — I252 Old myocardial infarction: Secondary | ICD-10-CM | POA: Diagnosis not present

## 2018-03-31 DIAGNOSIS — I69218 Other symptoms and signs involving cognitive functions following other nontraumatic intracranial hemorrhage: Secondary | ICD-10-CM | POA: Diagnosis not present

## 2018-03-31 DIAGNOSIS — I69291 Dysphagia following other nontraumatic intracranial hemorrhage: Secondary | ICD-10-CM | POA: Diagnosis not present

## 2018-03-31 DIAGNOSIS — Z9181 History of falling: Secondary | ICD-10-CM | POA: Diagnosis not present

## 2018-03-31 DIAGNOSIS — I480 Paroxysmal atrial fibrillation: Secondary | ICD-10-CM | POA: Diagnosis not present

## 2018-03-31 DIAGNOSIS — Z86718 Personal history of other venous thrombosis and embolism: Secondary | ICD-10-CM | POA: Diagnosis not present

## 2018-03-31 DIAGNOSIS — E785 Hyperlipidemia, unspecified: Secondary | ICD-10-CM | POA: Diagnosis not present

## 2018-03-31 DIAGNOSIS — R1310 Dysphagia, unspecified: Secondary | ICD-10-CM | POA: Diagnosis not present

## 2018-03-31 DIAGNOSIS — I69220 Aphasia following other nontraumatic intracranial hemorrhage: Secondary | ICD-10-CM | POA: Diagnosis not present

## 2018-03-31 DIAGNOSIS — I10 Essential (primary) hypertension: Secondary | ICD-10-CM | POA: Diagnosis not present

## 2018-03-31 DIAGNOSIS — Z7982 Long term (current) use of aspirin: Secondary | ICD-10-CM | POA: Diagnosis not present

## 2018-04-01 DIAGNOSIS — I25810 Atherosclerosis of coronary artery bypass graft(s) without angina pectoris: Secondary | ICD-10-CM | POA: Diagnosis not present

## 2018-04-01 DIAGNOSIS — I480 Paroxysmal atrial fibrillation: Secondary | ICD-10-CM | POA: Diagnosis not present

## 2018-04-01 DIAGNOSIS — I10 Essential (primary) hypertension: Secondary | ICD-10-CM | POA: Diagnosis not present

## 2018-04-01 DIAGNOSIS — I252 Old myocardial infarction: Secondary | ICD-10-CM | POA: Diagnosis not present

## 2018-04-01 DIAGNOSIS — E785 Hyperlipidemia, unspecified: Secondary | ICD-10-CM | POA: Diagnosis not present

## 2018-04-01 DIAGNOSIS — Z7982 Long term (current) use of aspirin: Secondary | ICD-10-CM | POA: Diagnosis not present

## 2018-04-01 DIAGNOSIS — I69220 Aphasia following other nontraumatic intracranial hemorrhage: Secondary | ICD-10-CM | POA: Diagnosis not present

## 2018-04-01 DIAGNOSIS — I69291 Dysphagia following other nontraumatic intracranial hemorrhage: Secondary | ICD-10-CM | POA: Diagnosis not present

## 2018-04-01 DIAGNOSIS — Z86718 Personal history of other venous thrombosis and embolism: Secondary | ICD-10-CM | POA: Diagnosis not present

## 2018-04-01 DIAGNOSIS — I69218 Other symptoms and signs involving cognitive functions following other nontraumatic intracranial hemorrhage: Secondary | ICD-10-CM | POA: Diagnosis not present

## 2018-04-01 DIAGNOSIS — R1310 Dysphagia, unspecified: Secondary | ICD-10-CM | POA: Diagnosis not present

## 2018-04-01 DIAGNOSIS — Z9181 History of falling: Secondary | ICD-10-CM | POA: Diagnosis not present

## 2018-04-02 ENCOUNTER — Telehealth: Payer: Self-pay

## 2018-04-02 NOTE — Telephone Encounter (Signed)
Spoke with Freida Busman informing him Dr. [name] is giving verbal order to PT.  Verbalizes understanding.

## 2018-04-02 NOTE — Telephone Encounter (Signed)
Donita nurse with [facility] requesting verbal orders for [facility] skilled nursing 2 x a wk for 1 wk and 1 x a wk for 2 wks to monitor cardio pulmonary status,stroke mgt and fall prevention.Please advise.

## 2018-04-02 NOTE — Telephone Encounter (Signed)
Agree with this. Thanks.  

## 2018-04-02 NOTE — Telephone Encounter (Signed)
[name] Cruz from [facility] PT called for verbal order for 1 x 1 week following d/c from hospital. Please call [phone]

## 2018-04-02 NOTE — Telephone Encounter (Signed)
Agree w this thanks

## 2018-04-03 DIAGNOSIS — I25810 Atherosclerosis of coronary artery bypass graft(s) without angina pectoris: Secondary | ICD-10-CM | POA: Diagnosis not present

## 2018-04-03 DIAGNOSIS — I10 Essential (primary) hypertension: Secondary | ICD-10-CM | POA: Diagnosis not present

## 2018-04-03 DIAGNOSIS — I480 Paroxysmal atrial fibrillation: Secondary | ICD-10-CM | POA: Diagnosis not present

## 2018-04-03 DIAGNOSIS — Z86718 Personal history of other venous thrombosis and embolism: Secondary | ICD-10-CM | POA: Diagnosis not present

## 2018-04-03 DIAGNOSIS — I69220 Aphasia following other nontraumatic intracranial hemorrhage: Secondary | ICD-10-CM | POA: Diagnosis not present

## 2018-04-03 DIAGNOSIS — I69291 Dysphagia following other nontraumatic intracranial hemorrhage: Secondary | ICD-10-CM | POA: Diagnosis not present

## 2018-04-03 DIAGNOSIS — I252 Old myocardial infarction: Secondary | ICD-10-CM | POA: Diagnosis not present

## 2018-04-03 DIAGNOSIS — E785 Hyperlipidemia, unspecified: Secondary | ICD-10-CM | POA: Diagnosis not present

## 2018-04-03 DIAGNOSIS — Z9181 History of falling: Secondary | ICD-10-CM | POA: Diagnosis not present

## 2018-04-03 DIAGNOSIS — I69218 Other symptoms and signs involving cognitive functions following other nontraumatic intracranial hemorrhage: Secondary | ICD-10-CM | POA: Diagnosis not present

## 2018-04-03 DIAGNOSIS — Z7982 Long term (current) use of aspirin: Secondary | ICD-10-CM | POA: Diagnosis not present

## 2018-04-03 DIAGNOSIS — R1310 Dysphagia, unspecified: Secondary | ICD-10-CM | POA: Diagnosis not present

## 2018-04-03 NOTE — Telephone Encounter (Signed)
Spoke with Donita informing her Dr. [name] is giving verbal order for services requested.  Verbalizes understanding.

## 2018-04-06 DIAGNOSIS — I25810 Atherosclerosis of coronary artery bypass graft(s) without angina pectoris: Secondary | ICD-10-CM | POA: Diagnosis not present

## 2018-04-06 DIAGNOSIS — I69220 Aphasia following other nontraumatic intracranial hemorrhage: Secondary | ICD-10-CM | POA: Diagnosis not present

## 2018-04-06 DIAGNOSIS — Z7982 Long term (current) use of aspirin: Secondary | ICD-10-CM | POA: Diagnosis not present

## 2018-04-06 DIAGNOSIS — I69218 Other symptoms and signs involving cognitive functions following other nontraumatic intracranial hemorrhage: Secondary | ICD-10-CM | POA: Diagnosis not present

## 2018-04-06 DIAGNOSIS — R1310 Dysphagia, unspecified: Secondary | ICD-10-CM | POA: Diagnosis not present

## 2018-04-06 DIAGNOSIS — E785 Hyperlipidemia, unspecified: Secondary | ICD-10-CM | POA: Diagnosis not present

## 2018-04-06 DIAGNOSIS — I252 Old myocardial infarction: Secondary | ICD-10-CM | POA: Diagnosis not present

## 2018-04-06 DIAGNOSIS — I10 Essential (primary) hypertension: Secondary | ICD-10-CM | POA: Diagnosis not present

## 2018-04-06 DIAGNOSIS — I480 Paroxysmal atrial fibrillation: Secondary | ICD-10-CM | POA: Diagnosis not present

## 2018-04-06 DIAGNOSIS — Z86718 Personal history of other venous thrombosis and embolism: Secondary | ICD-10-CM | POA: Diagnosis not present

## 2018-04-06 DIAGNOSIS — Z9181 History of falling: Secondary | ICD-10-CM | POA: Diagnosis not present

## 2018-04-06 DIAGNOSIS — I69291 Dysphagia following other nontraumatic intracranial hemorrhage: Secondary | ICD-10-CM | POA: Diagnosis not present

## 2018-04-08 ENCOUNTER — Ambulatory Visit (INDEPENDENT_AMBULATORY_CARE_PROVIDER_SITE_OTHER): Payer: Medicare Other | Admitting: Adult Health

## 2018-04-08 ENCOUNTER — Other Ambulatory Visit: Payer: Self-pay

## 2018-04-08 ENCOUNTER — Encounter: Payer: Self-pay | Admitting: Adult Health

## 2018-04-08 DIAGNOSIS — G40909 Epilepsy, unspecified, not intractable, without status epilepticus: Secondary | ICD-10-CM | POA: Diagnosis not present

## 2018-04-08 MED ORDER — LACOSAMIDE 100 MG PO TABS: 100.0000 mg | ORAL_TABLET | Freq: Two times a day (BID) | ORAL | 4 refills | Status: DC

## 2018-04-08 MED ORDER — LEVETIRACETAM 750 MG PO TABS: 1500.0000 mg | ORAL_TABLET | Freq: Two times a day (BID) | ORAL | 4 refills | Status: DC

## 2018-04-08 NOTE — Progress Notes (Signed)
I agree with the above plan 

## 2018-04-08 NOTE — Progress Notes (Signed)
[facility] [address] [place]. [place] [address] [phone]     Virtual Visit via Telephone Note  I connected with [name] Cruz on 04/08/18 at 10:15 AM EDT by telephone located at [facility] and verified that I am speaking with the correct person using two identifiers who reports being located in his own home.  Participation by both Mr. Bar and Sedalia Muta (family member) for providing medical history.   I discussed the limitations, risks, security and privacy concerns of performing an evaluation and management service by telephone and the availability of in person appointments. I also discussed with the patient that there may be a patient responsible charge related to this service. The patient expressed understanding and agreed to proceed with verbal consent obtained.  Increasing  History of Present Illness:  [name] Cruz is a 63 y.o. malewith underlying medical history of seizures, CAD s/p CABG, intracranial bleed, history of stroke with resultant aphasia, PAF not on AC due to hemorrhage, and history of upper extremity DVT. He was initially scheduled for face-to-face office visit today at this time but due to COVID19, face-to-face office visit rescheduled for non-face-to-face telephone visit in regards to recent ED admission for seizure activity.  Last office visit on 01/15/2018 with complaints of excessive daytime fatigue causing him to have difficulty performing routine activities including ADLs and IADLs.  Discussion with patient and mother at that time regarding considering decreasing Keppra dosage to see if this helps with daytime fatigue along with discussion of potentially increasing risk for seizure activity.  It was agreed to decrease dose down to Keppra 500 mg daily.  After ED admission with seizure activity, Keppra increased to 1500 mg twice daily along with initiation of Vimpat 100 mg twice daily.  He states since he has been home he has  been doing well without any recurrent seizure activity.  He does endorse mild daytime fatigue but is able to perform activities around his house such as mowing the yard and getting his garden ready to start planting vegetables.  He has continue to monitor blood pressure at home which per Diane (family member), BP has been fluctuating but is asymptomatic.  He denies any elevated levels recently.  Per ED MRI, evidence of microhemorrhages indicating uncontrolled hypertension versus amyloid angiopathy.    Observations/Objective:  General: Pleasant middle-aged Caucasian male cooperative with history taking and asking appropriate questions  Reviewed all imaging from ED admission including MRI and EEG   Assessment and Plan:  [name] Cruz is a 63 year old male who has been followed in this office for history of intracranial hemorrhage and seizure activity.  He has unfortunately experienced seizure activity with ED admission on 03/25/2018.  He has been stable since this time after increasing Keppra dosage and initiating Vimpat.  It is recommended at this time to continue current dosage of Keppra 1500 mg twice daily along with Vimpat 100 mg twice daily as he has been stable without reported side effects or recurrent seizure activity.  He was advised that we can consider repeating EEG at follow-up visit as EEGs are currently being performed for only emergent reasons.  Highly encouraged maintaining regulation of BP due to evidence of microhemorrhages likely secondary to uncontrolled hypertension versus amyloid angiopathy.  He will continue on aspirin at this time along with atorvastatin for secondary stroke prevention.  He will continue to follow with his PCP for HTN and HLD management along with ongoing cognitive impairment.  Follow Up Instructions:  Advised to follow-up in  3 to 4 months for an office visit or call earlier if needed      I discussed the assessment and treatment plan with the patient.  The  patient was provided an opportunity to ask questions and all were answered to their satisfaction. The patient agreed with the plan and verbalized an understanding of the instructions.   I provided 20 minutes of non-face-to-face time during this encounter.    George Hugh, AGNP-BC  [facility] [address] [place], [place] [address]  Phone [phone] Fax [phone] Note: This document was prepared with digital dictation and possible smart phrase technology. Any transcriptional errors that result from this process are unintentional.

## 2018-04-08 NOTE — Patient Instructions (Addendum)
We will continue on current dosages of Vimpat 100 mg twice daily along with Keppra 1500 mg twice daily for seizure prevention.  We will consider setting you up for repeat EEG at follow-up visit along with any needed lab work regarding her seizure medications.  Please monitor blood pressure at home with avoiding rapid fluctuation and to continue to follow with your primary care provider in regards to blood pressure and cholesterol management.  We will continue on aspirin 81 mg at this time along with atorvastatin for secondary stroke prevention.  He will follow-up in 3 to 4 months time and will be called within the next couple days to schedule this appointment.  Please call office during the interval time if you have any questions or concerns   Lacosamide tablets What is this medicine? LACOSAMIDE (la KOE sa mide) is used to control seizures caused by certain types of epilepsy. This medicine may be used for other purposes; ask your health care provider or pharmacist if you have questions. COMMON BRAND NAME(S): Vimpat What should I tell my health care provider before I take this medicine? They need to know if you have any of these conditions: -drug abuse or addiction -heart disease -kidney disease -liver disease -suicidal thoughts, plans, or attempt; a previous suicide attempt by you or a family member -an unusual or allergic reaction to lacosamide, other medicines, foods, dyes, or preservatives -pregnant or trying to get pregnant -breast-feeding How should I use this medicine? Take this medicine by mouth with a glass of water. Follow the directions on the prescription label. Do not cut, crush or chew this medicine. Swallow tablets whole. You can take it with or without food. If it upsets your stomach, take it with food. Take your medicine at regular intervals. Do not take it more often than directed. Do not stop taking except on your doctor's advice. A special MedGuide will be given to you by the  pharmacist with each prescription and refill. Be sure to read this information carefully each time. Talk to your pediatrician regarding the use of this medicine in children. While this drug may be prescribed for children as young as 37 years of age for selected conditions, precautions do apply. Overdosage: If you think you have taken too much of this medicine contact a poison control center or emergency room at once. NOTE: This medicine is only for you. Do not share this medicine with others. What if I miss a dose? If you miss a dose, take it as soon as you can. If it is almost time for your next dose, take only that dose. Do not take double or extra doses. What may interact with this medicine? This medicine may interact with the following medications: -atazanavir -beta-blockers like metoprolol and propranolol -calcium channel blockers like diltiazem and verapamil -certain medicines for irregular heart beat like amiodarone, bepridil, dofetilide, encainide, flecainide, propafenone, quinidine -certain medicines for seizures like carbamazepine, phenytoin -digoxin -dronedarone -lopinavir/ritonavir This list may not describe all possible interactions. Give your health care provider a list of all the medicines, herbs, non-prescription drugs, or dietary supplements you use. Also tell them if you smoke, drink alcohol, or use illegal drugs. Some items may interact with your medicine. What should I watch for while using this medicine? Visit your doctor or health care professional for regular checks on your progress. This medicine needs careful monitoring. Wear a medical ID bracelet or chain, and carry a card that describes your disease and details of your medicine and dosage times.  You may get drowsy or dizzy. Do not drive, use machinery, or do anything that needs mental alertness until you know how this medicine affects you. Do not stand or sit up quickly, especially if you are an older patient. This  reduces the risk of dizzy or fainting spells. Alcohol may interfere with the effect of this medicine. Avoid alcoholic drinks. The use of this medicine may increase the chance of suicidal thoughts or actions. Pay special attention to how you are responding while on this medicine. Any worsening of mood, or thoughts of suicide or dying should be reported to your health care professional right away. Women who become pregnant while using this medicine may enroll in the [place] American Antiepileptic Drug Pregnancy Registry by calling [phone]. This registry collects information about the safety of antiepileptic drug use during pregnancy. What side effects may I notice from receiving this medicine? Side effects that you should report to your doctor or health care professional as soon as possible: -allergic reactions like skin rash, itching or hives, swelling of the face, lips, or tongue -signs and symptoms of a dangerous change in heartbeat or heart rhythm like chest pain; dizziness; fast, irregular heartbeat; palpitations; feeling faint or lightheaded; falls; breathing problems -signs and symptoms of liver injury like dark yellow or brown urine; general ill feeling or flu-like symptoms; light-colored stools; loss of appetite; nausea; right upper belly pain; unusually weak or tired; yellowing of the eyes or skin -suicidal thoughts, mood changes Side effects that usually do not require medical attention (report to your doctor or health care professional if they continue or are bothersome): -blurred vision -dizziness -drowsiness -headache -nausea This list may not describe all possible side effects. Call your doctor for medical advice about side effects. You may report side effects to FDA at 1-800-FDA-1088. Where should I keep my medicine? Keep out of the reach of children. This medicine can be abused. Keep your medicine in a safe place to protect it from theft. Do not share this medicine with anyone.  Selling or giving away this medicine is dangerous and against the law. This medicine may cause harm and death if it is taken by other adults, children, or pets. Return medicine that has not been used to an official disposal site. Contact the DEA at (205) 160-3104 or your city/county government to find a site. If you cannot return the medicine, mix any unused medicine with a substance like cat litter or coffee grounds. Then throw the medicine away in a sealed container like a sealed bag or coffee can with a lid. Do not use the medicine after the expiration date. Store at room temperature between 15 and 30 degrees C (59 and 86 degrees F). NOTE: This sheet is a summary. It may not cover all possible information. If you have questions about this medicine, talk to your doctor, pharmacist, or health care provider.  2019 Elsevier/Gold Standard (2017-06-26 10:22:21)     Levetiracetam tablets What is this medicine? LEVETIRACETAM (lee ve tye RA se tam) is an antiepileptic drug. It is used with other medicines to treat certain types of seizures. This medicine may be used for other purposes; ask your health care provider or pharmacist if you have questions. COMMON BRAND NAME(S): Keppra, Roweepra What should I tell my health care provider before I take this medicine? They need to know if you have any of these conditions: -kidney disease -suicidal thoughts, plans, or attempt; a previous suicide attempt by you or a family member -an unusual or allergic  reaction to levetiracetam, other medicines, foods, dyes, or preservatives -pregnant or trying to get pregnant -breast-feeding How should I use this medicine? Take this medicine by mouth with a glass of water. Follow the directions on the prescription label. Swallow the tablets whole. Do not crush or chew this medicine. You may take this medicine with or without food. Take your doses at regular intervals. Do not take your medicine more often than directed. Do  not stop taking this medicine or any of your seizure medicines unless instructed by your doctor or health care professional. Stopping your medicine suddenly can increase your seizures or their severity. A special MedGuide will be given to you by the pharmacist with each prescription and refill. Be sure to read this information carefully each time. Contact your pediatrician or health care professional regarding the use of this medication in children. While this drug may be prescribed for children as young as 184 years of age for selected conditions, precautions do apply. Overdosage: If you think you have taken too much of this medicine contact a poison control center or emergency room at once. NOTE: This medicine is only for you. Do not share this medicine with others. What if I miss a dose? If you miss a dose, take it as soon as you can. If it is almost time for your next dose, take only that dose. Do not take double or extra doses. What may interact with this medicine? This medicine may interact with the following medications: -carbamazepine -colesevelam -probenecid -sevelamer This list may not describe all possible interactions. Give your health care provider a list of all the medicines, herbs, non-prescription drugs, or dietary supplements you use. Also tell them if you smoke, drink alcohol, or use illegal drugs. Some items may interact with your medicine. What should I watch for while using this medicine? Visit your doctor or health care professional for a regular check on your progress. Wear a medical identification bracelet or chain to say you have epilepsy, and carry a card that lists all your medications. It is important to take this medicine exactly as instructed by your health care professional. When first starting treatment, your dose may need to be adjusted. It may take weeks or months before your dose is stable. You should contact your doctor or health care professional if your seizures  get worse or if you have any new types of seizures. You may get drowsy or dizzy. Do not drive, use machinery, or do anything that needs mental alertness until you know how this medicine affects you. Do not stand or sit up quickly, especially if you are an older patient. This reduces the risk of dizzy or fainting spells. Alcohol may interfere with the effect of this medicine. Avoid alcoholic drinks. The use of this medicine may increase the chance of suicidal thoughts or actions. Pay special attention to how you are responding while on this medicine. Any worsening of mood, or thoughts of suicide or dying should be reported to your health care professional right away. Women who become pregnant while using this medicine may enroll in the [place] Antiepileptic Drug Pregnancy Registry by calling [phone]. This registry collects information about the safety of antiepileptic drug use during pregnancy. What side effects may I notice from receiving this medicine? Side effects that you should report to your doctor or health care professional as soon as possible: -allergic reactions like skin rash, itching or hives, swelling of the face, lips, or tongue -breathing problems -dark urine -general ill  feeling or flu-like symptoms -problems with balance, talking, walking -unusually weak or tired -worsening of mood, thoughts or actions of suicide or dying -yellowing of the eyes or skin Side effects that usually do not require medical attention (report to your doctor or health care professional if they continue or are bothersome): -diarrhea -dizzy, drowsy -headache -loss of appetite This list may not describe all possible side effects. Call your doctor for medical advice about side effects. You may report side effects to FDA at 1-800-FDA-1088. Where should I keep my medicine? Keep out of reach of children. Store at room temperature between 15 and 30 degrees C (59 and 86 degrees F). Throw away any  unused medicine after the expiration date. NOTE: This sheet is a summary. It may not cover all possible information. If you have questions about this medicine, talk to your doctor, pharmacist, or health care provider.  2019 Elsevier/Gold Standard (2015-02-02 09:43:54)

## 2018-04-09 ENCOUNTER — Inpatient Hospital Stay: Payer: Medicare Other | Admitting: Adult Health

## 2018-04-10 ENCOUNTER — Ambulatory Visit: Payer: Medicare Other

## 2018-04-15 ENCOUNTER — Encounter: Payer: Medicare Other | Admitting: Family Medicine

## 2018-04-15 DIAGNOSIS — I252 Old myocardial infarction: Secondary | ICD-10-CM | POA: Diagnosis not present

## 2018-04-15 DIAGNOSIS — I69218 Other symptoms and signs involving cognitive functions following other nontraumatic intracranial hemorrhage: Secondary | ICD-10-CM | POA: Diagnosis not present

## 2018-04-15 DIAGNOSIS — I69291 Dysphagia following other nontraumatic intracranial hemorrhage: Secondary | ICD-10-CM | POA: Diagnosis not present

## 2018-04-15 DIAGNOSIS — Z86718 Personal history of other venous thrombosis and embolism: Secondary | ICD-10-CM | POA: Diagnosis not present

## 2018-04-15 DIAGNOSIS — I480 Paroxysmal atrial fibrillation: Secondary | ICD-10-CM | POA: Diagnosis not present

## 2018-04-15 DIAGNOSIS — Z7982 Long term (current) use of aspirin: Secondary | ICD-10-CM | POA: Diagnosis not present

## 2018-04-15 DIAGNOSIS — Z9181 History of falling: Secondary | ICD-10-CM | POA: Diagnosis not present

## 2018-04-15 DIAGNOSIS — I10 Essential (primary) hypertension: Secondary | ICD-10-CM | POA: Diagnosis not present

## 2018-04-15 DIAGNOSIS — E785 Hyperlipidemia, unspecified: Secondary | ICD-10-CM | POA: Diagnosis not present

## 2018-04-15 DIAGNOSIS — R1310 Dysphagia, unspecified: Secondary | ICD-10-CM | POA: Diagnosis not present

## 2018-04-15 DIAGNOSIS — I25810 Atherosclerosis of coronary artery bypass graft(s) without angina pectoris: Secondary | ICD-10-CM | POA: Diagnosis not present

## 2018-04-15 DIAGNOSIS — I69220 Aphasia following other nontraumatic intracranial hemorrhage: Secondary | ICD-10-CM | POA: Diagnosis not present

## 2018-04-16 DIAGNOSIS — I25810 Atherosclerosis of coronary artery bypass graft(s) without angina pectoris: Secondary | ICD-10-CM | POA: Diagnosis not present

## 2018-04-16 DIAGNOSIS — I69291 Dysphagia following other nontraumatic intracranial hemorrhage: Secondary | ICD-10-CM | POA: Diagnosis not present

## 2018-04-16 DIAGNOSIS — R1310 Dysphagia, unspecified: Secondary | ICD-10-CM | POA: Diagnosis not present

## 2018-04-16 DIAGNOSIS — I69218 Other symptoms and signs involving cognitive functions following other nontraumatic intracranial hemorrhage: Secondary | ICD-10-CM | POA: Diagnosis not present

## 2018-05-08 ENCOUNTER — Telehealth: Payer: Self-pay | Admitting: Adult Health

## 2018-05-08 ENCOUNTER — Telehealth: Payer: Self-pay

## 2018-05-08 ENCOUNTER — Telehealth: Payer: Self-pay | Admitting: Family Medicine

## 2018-05-08 NOTE — Telephone Encounter (Signed)
Left message on vm per dpr pt/family, his rxs have refills until 11/2018.  And they will need to contact pt's neurologist for seizure meds since neurology prescribes them.

## 2018-05-08 NOTE — Telephone Encounter (Signed)
Called pt to set up possible evisit, left message asking pt to call the office.  

## 2018-05-08 NOTE — Telephone Encounter (Signed)
Follow up:    Patient sister returning call back concering appt.

## 2018-05-08 NOTE — Telephone Encounter (Signed)
Returned pt call, left message asking pt to call the office.

## 2018-05-08 NOTE — Telephone Encounter (Signed)
Patient's Sister called today to request that the patient's blood pressure, cholesterol  and seizure medications be sent to [facility] . So they will be delivered to the patient

## 2018-05-11 ENCOUNTER — Telehealth: Payer: Self-pay | Admitting: *Deleted

## 2018-05-11 ENCOUNTER — Telehealth: Payer: Self-pay | Admitting: Adult Health

## 2018-05-11 ENCOUNTER — Other Ambulatory Visit: Payer: Self-pay | Admitting: Interventional Cardiology

## 2018-05-11 ENCOUNTER — Telehealth: Payer: Self-pay

## 2018-05-11 MED ORDER — LEVETIRACETAM 750 MG PO TABS: 1500.0000 mg | ORAL_TABLET | Freq: Two times a day (BID) | ORAL | 3 refills | Status: DC

## 2018-05-11 MED ORDER — SERTRALINE HCL 25 MG PO TABS: 12.5000 mg | ORAL_TABLET | Freq: Every day | ORAL | 3 refills | Status: DC

## 2018-05-11 MED ORDER — SERTRALINE HCL 25 MG PO TABS: 12.5000 mg | ORAL_TABLET | Freq: Every day | ORAL | 0 refills | Status: DC

## 2018-05-11 MED ORDER — ATORVASTATIN CALCIUM 80 MG PO TABS: ORAL_TABLET | ORAL | 2 refills | Status: DC

## 2018-05-11 MED ORDER — LACOSAMIDE 100 MG PO TABS: 100.0000 mg | ORAL_TABLET | Freq: Two times a day (BID) | ORAL | 0 refills | Status: DC

## 2018-05-11 NOTE — Telephone Encounter (Signed)
New message    *STAT* If patient is at the pharmacy, call can be transferred to refill team.   1. Which medications need to be refilled? (please list name of each medication and dose if known) atorvastatin (LIPITOR) 80 MG tablet  2. Which pharmacy/location (including street and city if local pharmacy) is medication to be sent to?)[facility]  3. Do they need a 30 day or 90 day supply? 90

## 2018-05-11 NOTE — Telephone Encounter (Signed)
Pt sister Steward Drone [facility]) called in and stated to disregard the other sister Sedalia Muta called in and stated she gets confused at times , Steward Drone states Keppra was suppose to be sent to [facility] to where it can be delivered and it will be no cost.

## 2018-05-11 NOTE — Telephone Encounter (Signed)
E-scribed sertraline.  However, it looks like [facility] prescribes Coreg.

## 2018-05-11 NOTE — Telephone Encounter (Signed)
Steward Drone called in and stated the Lacosamide (VIMPAT) 100 MG TABS also needs to be sent to [facility]

## 2018-05-11 NOTE — Telephone Encounter (Signed)
30-day refill for Vimpat 100 mg twice daily sent to [facility]

## 2018-05-11 NOTE — Telephone Encounter (Signed)
Pt sister has called in and wants to know if she can cut his Keppra 1000mg  in half and give it to him because he is running out of 750mg  I advised her that he has 2 refills avail at the pharmacy , she stated she had no way to get to the pharmacy so she wants to know if they can be cut in half, advised her [facility] is currently offering delivery, she stated she cant pay for script

## 2018-05-11 NOTE — Telephone Encounter (Signed)
Please advise sister that he should continue current dose of Keppra 1500 mg twice daily (currently 750 mg tablets x2 twice daily).  Attempted to wean patient off of Keppra recently but unfortunately had recurrent seizure activity.  It is highly recommended to continue current dosage and if prescription too high at [facility], she can call around to other pharmacies for potential lower price.

## 2018-05-11 NOTE — Telephone Encounter (Signed)
Keppra sent to [facility] per sister Steward Drone where pt gets if for free.

## 2018-05-11 NOTE — Telephone Encounter (Signed)
Pt's medication was sent to pt's pharmacy as requested. Confirmation received.  °

## 2018-05-11 NOTE — Telephone Encounter (Signed)
plz notify sertraline was refilled. He should have 2 more 90 day refills for carvedilol at [facility] (as cardiology refilled 1 year supply in 11/2017.  Is he fully out of meds? Does he need 1 month supply to local pharmacy? If so may send in.

## 2018-05-11 NOTE — Telephone Encounter (Signed)
[name] called stating pt needs refill Coreg   And sertraline.  90 day supply Please send [facility] Pt is out of his meds  Best number for Steward Drone [phone]

## 2018-05-11 NOTE — Telephone Encounter (Signed)
Patient called stating that he giving his permission to talk with his sister Lurena Joiner since she is not on his DPR form. Juellz stated that he is living with his sister and she takes care of his medication. Lurena Joiner stated that he is out of his Kepra 750 mg, but has the 1000 mg that [name] prescribed. Lurena Joiner wants to know if she can cut the pills in 1/2 and make sure that he gets 1500 mg twice a day. Aida Raider that she needs to call and talk with his neurologist about the seizure medication and how to give it and she verbalized understanding.

## 2018-05-11 NOTE — Telephone Encounter (Signed)
IF pt sister call back he needs a 3 month follow up appt with JEssica NP.   VM was left that per Shanda Bumps NP pt needs to continue current dosage of keppra 1500 mg twice a day. I advise to ask a family member or neighbor provide transportation to pick up pts medications.

## 2018-05-11 NOTE — Addendum Note (Signed)
Addended by: George Hugh on: 05/11/2018 03:37 PM   Modules accepted: Orders

## 2018-05-12 ENCOUNTER — Telehealth: Payer: Self-pay

## 2018-05-12 NOTE — Telephone Encounter (Signed)
Follow up   Patients sister is returning call. Please call.

## 2018-05-12 NOTE — Telephone Encounter (Signed)
I called wife that visit will be change to video visit due to COVID 19. The wife has a I phone and he can call her cell phone.She gave consent to do video an file insurance for her husband.Chart updated with pharmacy and meds.

## 2018-05-12 NOTE — Telephone Encounter (Signed)
Spoke to patient's sister. Arranged for telephone visit.     Virtual Visit Pre-Appointment Phone Call  TELEPHONE CALL NOTE  [name] Cruz has been deemed a candidate for a follow-up tele-health visit to limit community exposure during the Covid-19 pandemic. I spoke with the patient via phone to ensure availability of phone/video source, confirm preferred email & phone number, and discuss instructions and expectations.  I reminded [name] Cruz to be prepared with any vital sign and/or heart rhythm information that could potentially be obtained via home monitoring, at the time of his visit. I reminded [name] Cruz to expect a phone call prior to his visit.  Patient/sister agrees to consent below.  [name] HawBrittany I Fabrizio Filip, RN 05/12/2018 11:20 AM   FULL LENGTH CONSENT FOR TELE-HEALTH VISIT   I hereby voluntarily request, consent and authorize [facility] and its employed or contracted physicians, physician assistants, nurse practitioners or other licensed health care professionals (the Practitioner), to provide me with telemedicine health care services (the "Services") as deemed necessary by the treating Practitioner. I acknowledge and consent to receive the Services by the Practitioner via telemedicine. I understand that the telemedicine visit will involve communicating with the Practitioner through live audiovisual communication technology and the disclosure of certain medical information by electronic transmission. I acknowledge that I have been given the opportunity to request an in-person assessment or other available alternative prior to the telemedicine visit and am voluntarily participating in the telemedicine visit.  I understand that I have the right to withhold or withdraw my consent to the use of telemedicine in the course of my care at any time, without affecting my right to future care or treatment, and that the Practitioner or I may terminate the telemedicine visit at any time. I  understand that I have the right to inspect all information obtained and/or recorded in the course of the telemedicine visit and may receive copies of available information for a reasonable fee.  I understand that some of the potential risks of receiving the Services via telemedicine include:  Marland Kitchen. Delay or interruption in medical evaluation due to technological equipment failure or disruption; . Information transmitted may not be sufficient (e.g. poor resolution of images) to allow for appropriate medical decision making by the Practitioner; and/or  . In rare instances, security protocols could fail, causing a breach of personal health information.  Furthermore, I acknowledge that it is my responsibility to provide information about my medical history, conditions and care that is complete and accurate to the best of my ability. I acknowledge that Practitioner's advice, recommendations, and/or decision may be based on factors not within their control, such as incomplete or inaccurate data provided by me or distortions of diagnostic images or specimens that may result from electronic transmissions. I understand that the practice of medicine is not an exact science and that Practitioner makes no warranties or guarantees regarding treatment outcomes. I acknowledge that I will receive a copy of this consent concurrently upon execution via email to the email address I last provided but may also request a printed copy by calling the office of [facility].    I understand that my insurance will be billed for this visit.   I have read or had this consent read to me. . I understand the contents of this consent, which adequately explains the benefits and risks of the Services being provided via telemedicine.  . I have been provided ample opportunity to ask questions regarding this consent and the Services and  have had my questions answered to my satisfaction. . I give my informed consent for the services to be  provided through the use of telemedicine in my medical care  By participating in this telemedicine visit I agree to the above.

## 2018-05-12 NOTE — Telephone Encounter (Signed)
Left message on vm per dpr for pt's sister, Steward Drone (on dpr), relaying Dr. Timoteo Expose message.

## 2018-05-13 ENCOUNTER — Encounter: Payer: Self-pay | Admitting: Interventional Cardiology

## 2018-05-13 ENCOUNTER — Other Ambulatory Visit: Payer: Self-pay

## 2018-05-13 ENCOUNTER — Telehealth (INDEPENDENT_AMBULATORY_CARE_PROVIDER_SITE_OTHER): Payer: Medicare Other | Admitting: Interventional Cardiology

## 2018-05-13 DIAGNOSIS — I25810 Atherosclerosis of coronary artery bypass graft(s) without angina pectoris: Secondary | ICD-10-CM | POA: Diagnosis not present

## 2018-05-13 DIAGNOSIS — E782 Mixed hyperlipidemia: Secondary | ICD-10-CM | POA: Diagnosis not present

## 2018-05-13 DIAGNOSIS — Z951 Presence of aortocoronary bypass graft: Secondary | ICD-10-CM | POA: Diagnosis not present

## 2018-05-13 DIAGNOSIS — I480 Paroxysmal atrial fibrillation: Secondary | ICD-10-CM | POA: Diagnosis not present

## 2018-05-13 DIAGNOSIS — I10 Essential (primary) hypertension: Secondary | ICD-10-CM | POA: Diagnosis not present

## 2018-05-13 NOTE — Patient Instructions (Signed)

## 2018-05-13 NOTE — Telephone Encounter (Signed)
I called Steward Drone pts sister appt made in 4 months. New appt given check in at 0815am. She verbalized understanding appt time and check in time.

## 2018-05-13 NOTE — Progress Notes (Signed)
Virtual Visit via Video Note   This visit type was conducted due to national recommendations for restrictions regarding the COVID-19 Pandemic (e.g. social distancing) in an effort to limit this patient's exposure and mitigate transmission in our community.  Due to his co-morbid illnesses, this patient is at least at moderate risk for complications without adequate follow up.  This format is felt to be most appropriate for this patient at this time.  All issues noted in this document were discussed and addressed.  A limited physical exam was performed with this format.  Please refer to the patient's chart for his consent to telehealth for [facility].   Attempted video visit, but he did not have a functional camera  Evaluation Performed:  Follow-up visit  Date:  05/13/2018   ID:  [name], Cruz 1956/01/04, MRN [id number]  Patient Location: Home Provider Location: Home  PCP:  [name], MD  Cardiologist:  Lance Muss, MD  Electrophysiologist:  None   Chief Complaint:  CAD  History of Present Illness:    [name] Cruz is a 63 y.o. male with with history ofhemorrhagic stroke (thought to be secondary to uncontrolled hypertension now with residual cognitive impairment), hypertension, hyperlipidemiawho presented to the [facility] with complaints of CP and dyspnea. He was noted to be in atrial fibrillation w/ RVR. Initial ECG was notable for diffuse ST depressions and ST elevation in aVR. He ruled in for NSTEMI. Initial troponin was 0.68 but rose to 19.6. He was started on IV heparin and IV Cardizem drip and taken to the cath lab where he was found to has severe 3 vessel disease noted, 60% LMCA stenosis, chronic total occlusion of ostial LAD, 70% proximal LCX lesion, and 70-90% proximal, mid, and distal RCA. Due to ongoing CP IABPwasplaced. CT surgery consulted and CABG was recommended.   On 02/24/17, he underwentemergent coronary artery bypass grafting  x3,performed by Dr. Virl Cagey the left internal mammary to the left anterior descending coronary artery, reverse saphenous vein graft to the distal right coronary artery, reverse saphenous vein graft to the circumflex coronary artery. Echo showed EF of 45-50% but LV gram estimate at time of cath was 25-30%. He did well post op. He was extubated, weaned off pressors and IABP. He required amiodarone for afib and was transitioned to PO and converted to NSR. Given previous h/o hemorrhagic stroke, no OAC was initiated.  He was hospitalized with a seizure in 03/2018.  He had positive troponin but normal LV EF by echo.  No angina at that time.    Since that time, he has been doing well.   Denies : Chest pain. Dizziness. Leg edema. Nitroglycerin use. Orthopnea. Palpitations. Paroxysmal nocturnal dyspnea. Shortness of breath. Syncope.    The patient does not have symptoms concerning for COVID-19 infection (fever, chills, cough, or new shortness of breath).    Past Medical History:  Diagnosis Date  . Cognitive impairment 2007   after stroke, saw rehab but told to stop because was too upsetting to him  . History of chicken pox   . HLD (hyperlipidemia)   . HTN (hypertension)   . NSTEMI (non-ST elevated myocardial infarction) (HCC) 02/21/2017  . Obesity   . Stroke, hemorrhagic (HCC) 2007   thought 2/2 HTN (240sbp); residual cognitive impairment, loss of R peripheral field, no driving   Past Surgical History:  Procedure Laterality Date  . ANKLE SURGERY  1990s   right foot with plate and screws  . CORONARY ARTERY  BYPASS GRAFT N/A 02/24/2017   3v Procedure: CORONARY ARTERY BYPASS GRAFTING (CABG) x 3 ON PUMP USING LEFT INTERNAL MAMMARY ARTERY TO LEFT ANTERIOR DESENDING CORNARY ARTERY, RIGHT GREATER SAPHENOUS VEIN TO LEFT CIRCUMFLEX ARTERY AND POSTERIOR DESENDING ARTERY. RIGHT GREATER SAPHENOUS VEIN OBTAINED VIA ENDOVEIN HARVEST.;  Surgeon: Delight OvensGerhardt, Edward B, MD  . IABP INSERTION N/A 02/21/2017    Procedure: IABP Insertion;  Surgeon: Yvonne KendallEnd, Christopher, MD;  Location: [facility] INVASIVE CV LAB;  Service: Cardiovascular;  Laterality: N/A;  . LEFT HEART CATH AND CORONARY ANGIOGRAPHY N/A 02/21/2017   Procedure: LEFT HEART CATH AND CORONARY ANGIOGRAPHY;  Surgeon: Yvonne KendallEnd, Christopher, MD;  Location: [facility] INVASIVE CV LAB;  Service: Cardiovascular;  Laterality: N/A;  . TEE WITHOUT CARDIOVERSION N/A 02/24/2017   Procedure: TRANSESOPHAGEAL ECHOCARDIOGRAM (TEE);  Surgeon: Delight OvensGerhardt, Edward B, MD;  [facility];  Service: Open Heart Surgery;  Laterality: N/A;     Current Meds  Medication Sig  . aspirin 81 MG EC tablet Take 1 tablet (81 mg total) by mouth daily.  Marland Kitchen. atorvastatin (LIPITOR) 80 MG tablet TAKE 1 TABLET (80 MG TOTAL) BY MOUTH DAILY. FOR CHOLESTEROL  . carvedilol (COREG) 3.125 MG tablet Take 1 tablet (3.125 mg total) by mouth 2 (two) times daily.  . Cyanocobalamin (B-12) 1000 MCG SUBL Place 1 tablet under the tongue daily.  . ferrous sulfate 325 (65 FE) MG tablet Take 1 tablet (325 mg total) by mouth daily with breakfast for 30 days.  . Lacosamide (VIMPAT) 100 MG TABS Take 1 tablet (100 mg total) by mouth 2 (two) times daily.  Marland Kitchen. levETIRAcetam (KEPPRA) 750 MG tablet Take 2 tablets (1,500 mg total) by mouth 2 (two) times daily.  . Multiple Vitamin (MULTIVITAMIN WITH MINERALS) TABS tablet Take 1 tablet by mouth daily.  . sertraline (ZOLOFT) 25 MG tablet Take 0.5 tablets (12.5 mg total) by mouth at bedtime.     Allergies:   Losartan   Social History   Tobacco Use  . Smoking status: Never Smoker  . Smokeless tobacco: Former Engineer, waterUser  Substance Use Topics  . Alcohol use: Not Currently    Alcohol/week: 14.0 standard drinks    Types: 12 Cans of beer, 2 Shots of liquor per week  . Drug use: No     Family Hx: The patient's family history includes Alcohol abuse in his father; Alzheimer's disease in his maternal grandfather; Cancer in his mother. There is no history of Coronary artery disease, Stroke, or  Diabetes.  ROS:   Please see the history of present illness.    memory issues All other systems reviewed and are negative.   Prior CV studies:   The following studies were reviewed today:  EF normal in 03/2018.    Labs/Other Tests and Data Reviewed:    EKG:  An ECG dated 03/27/2018 was personally reviewed today and demonstrated:  NSR, no ST changes  Recent Labs: 03/25/2018: TSH 0.576 03/29/2018: ALT 16; BUN 8; Creatinine, Ser 0.68; Hemoglobin 12.4; Magnesium 1.7; Platelets 183; Potassium 3.4; Sodium 141   Recent Lipid Panel Lab Results  Component Value Date/Time   CHOL 177 07/11/2017 09:50 AM   TRIG 54 08/12/2017 10:25 PM   HDL 96 07/11/2017 09:50 AM   CHOLHDL 1.8 07/11/2017 09:50 AM   CHOLHDL 3 01/28/2017 07:52 AM   LDLCALC 65 07/11/2017 09:50 AM   LDLDIRECT 122.0 07/22/2016 09:59 AM    Wt Readings from Last 3 Encounters:  03/26/18 185 lb (83.9 kg)  03/08/18 157 lb (71.2 kg)  01/15/18 161 lb (73  kg)     Objective:    Vital Signs:  BP 122/81   Pulse 67   Ht  (1.6 m)   BMI 32.77 kg/m    VITAL SIGNS:  reviewed RESPIRATORY:  no shortness of breath NEURO:  slow speech PSYCH:  normal affect exam limited by phone format  Speech is somewhat repetitive  ASSESSMENT & PLAN:    1. CAD: No angina.  Continue aggressive secondary prevention. No sx despite low level troponin a month ago.  Would not do any ischemia w/u at this time.  2. PAF: No anticoagulation due to prior ICH. 3. H/o hemorrhagic stroke.  4. Hyperlipidemia: LDL 65 on atorvastatin in 1/32.  5. HTN: The current medical regimen is effective;  continue present plan and medications. 6. Avoid falling.  He is trying to stay active.    COVID-19 Education: The signs and symptoms of COVID-19 were discussed with the patient and how to seek care for testing (follow up with PCP or arrange E-visit).  The importance of social distancing was discussed today.  Time:   Today, I have spent 15 minutes with the  patient with telehealth technology discussing the above problems.     Medication Adjustments/Labs and Tests Ordered: Current medicines are reviewed at length with the patient today.  Concerns regarding medicines are outlined above.   Tests Ordered: No orders of the defined types were placed in this encounter.   Medication Changes: No orders of the defined types were placed in this encounter.   Disposition:  Follow up in 1 year(s)  Signed, Lance Muss, MD  05/13/2018 4:17 PM    [facility]

## 2018-06-17 ENCOUNTER — Ambulatory Visit (INDEPENDENT_AMBULATORY_CARE_PROVIDER_SITE_OTHER): Payer: Medicare Other

## 2018-06-17 DIAGNOSIS — Z0000 Encounter for general adult medical examination without abnormal findings: Secondary | ICD-10-CM | POA: Diagnosis not present

## 2018-06-17 NOTE — Progress Notes (Signed)
I reviewed health advisor's note, was available for consultation, and agree with documentation and plan.   Signed,  Tris Howell T. Remedios Mckone, MD  

## 2018-06-17 NOTE — Progress Notes (Signed)
PCP notes:   Health maintenance:  Colon cancer screening - FOBT kit to be provided to patient at lab appt  Abnormal screenings:   None  Patient concerns:   None  Nurse concerns:  None  Next PCP appt:   06/22/18 @ 1500

## 2018-06-17 NOTE — Patient Instructions (Signed)
[name] Cruz , Thank you for taking time to come for your Medicare Wellness Visit. I appreciate your ongoing commitment to your health goals. Please review the following plan we discussed and let me know if I can assist you in the future.   These are the goals we discussed: Goals    . Patient Stated     Starting 06/17/18, I will continue to take medications as prescribed.        This is a list of the screening recommended for you and due dates:  Health Maintenance  Topic Date Due  . Stool Blood Test  01/14/2019*  . Colon Cancer Screening  01/14/2020*  . DTaP/Tdap/Td vaccine (1 - Tdap) 10/29/2022*  . Flu Shot  08/15/2018  . Tetanus Vaccine  10/29/2022  .  Hepatitis C: One time screening is recommended by Center for Disease Control  (CDC) for  adults born from [birth year] through 1965.   Completed  . HIV Screening  Completed  *Topic was postponed. The date shown is not the original due date.   Preventive Care for Adults  A healthy lifestyle and preventive care can promote health and wellness. Preventive health guidelines for adults include the following key practices.  . A routine yearly physical is a good way to check with your health care provider about your health and preventive screening. It is a chance to share any concerns and updates on your health and to receive a thorough exam.  . Visit your dentist for a routine exam and preventive care every 6 months. Brush your teeth twice a day and floss once a day. Good oral hygiene prevents tooth decay and gum disease.  . The frequency of eye exams is based on your age, health, family medical history, use  of contact lenses, and other factors. Follow your health care provider's recommendations for frequency of eye exams.  . Eat a healthy diet. Foods like vegetables, fruits, whole grains, low-fat dairy products, and lean protein foods contain the nutrients you need without too many calories. Decrease your intake of foods high in solid fats, added  sugars, and salt. Eat the right amount of calories for you. Get information about a proper diet from your health care provider, if necessary.  . Regular physical exercise is one of the most important things you can do for your health. Most adults should get at least 150 minutes of moderate-intensity exercise (any activity that increases your heart rate and causes you to sweat) each week. In addition, most adults need muscle-strengthening exercises on 2 or more days a week.  Silver Sneakers may be a benefit available to you. To determine eligibility, you may visit the website: www.silversneakers.com or contact program at (662) 407-8233 Mon-Fri between 8AM-8PM.   . Maintain a healthy weight. The body mass index (BMI) is a screening tool to identify possible weight problems. It provides an estimate of body fat based on height and weight. Your health care provider can find your BMI and can help you achieve or maintain a healthy weight.   For adults 20 years and older: ? A BMI below 18.5 is considered underweight. ? A BMI of 18.5 to 24.9 is normal. ? A BMI of 25 to 29.9 is considered overweight. ? A BMI of 30 and above is considered obese.   . Maintain normal blood lipids and cholesterol levels by exercising and minimizing your intake of saturated fat. Eat a balanced diet with plenty of fruit and vegetables. Blood tests for lipids and cholesterol should begin  at age 68 and be repeated every 5 years. If your lipid or cholesterol levels are high, you are over 50, or you are at high risk for heart disease, you may need your cholesterol levels checked more frequently. Ongoing high lipid and cholesterol levels should be treated with medicines if diet and exercise are not working.  . If you smoke, find out from your health care provider how to quit. If you do not use tobacco, please do not start.  . If you choose to drink alcohol, please do not consume more than 2 drinks per day. One drink is considered to  be 12 ounces (355 mL) of beer, 5 ounces (148 mL) of wine, or 1.5 ounces (44 mL) of liquor.  . If you are 35-24 years old, ask your health care provider if you should take aspirin to prevent strokes.  . Use sunscreen. Apply sunscreen liberally and repeatedly throughout the day. You should seek shade when your shadow is shorter than you. Protect yourself by wearing long sleeves, pants, a wide-brimmed hat, and sunglasses year round, whenever you are outdoors.  . Once a month, do a whole body skin exam, using a mirror to look at the skin on your back. Tell your health care provider of new moles, moles that have irregular borders, moles that are larger than a pencil eraser, or moles that have changed in shape or color.

## 2018-06-17 NOTE — Progress Notes (Signed)
Subjective:   [name] Cruz is a 63 y.o. male who presents for Medicare Annual/Subsequent preventive examination.  Review of Systems:  N/A Cardiac Risk Factors include: advanced age (>6855men, 66>65 women);male gender;dyslipidemia     Objective:    Vitals: There were no vitals taken for this visit.  There is no height or weight on file to calculate BMI.  Advanced Directives 06/17/2018 03/08/2018 08/09/2017 02/26/2017 02/21/2017 02/20/2017 04/26/2012  Does Patient Have a Medical Advance Directive? Yes No No - Yes No Patient has advance directive, copy not in chart  Type of Advance Directive Healthcare Power of [place];Living will - - Public affairs consultantHealthcare Power of eBayttorney Healthcare Power of [place]Attorney;Living will - Healthcare Power of Attorney  Does patient want to make changes to medical advance directive? - - - No - Patient declined - - -  Copy of Healthcare Power of Attorney in Chart? No - copy requested - - - Yes - Copy requested from family  Would patient like information on creating a medical advance directive? - No - Patient declined - - - No - Patient declined -  Pre-existing out of facility DNR order (yellow form or pink MOST form) - - - - - - No    Tobacco Social History   Tobacco Use  Smoking Status Never Smoker  Smokeless Tobacco Former Social workerUser     Counseling given: No   Clinical Intake:  Pre-visit preparation completed: Yes  Pain : No/denies pain Pain Score: 0-No pain     Nutritional Status: BMI 25 -29 Overweight Nutritional Risks: None  How often do you need to have someone help you when you read instructions, pamphlets, or other written materials from your doctor or pharmacy?: 1 - Never What is the last grade level you completed in school?: stopped at age 63  Interpreter Needed?: No  Comments: pt and sister live together Information entered by :: LPinson, LPN  Past Medical History:  Diagnosis Date   Cognitive impairment 2007   after stroke, saw rehab but told to stop  because was too upsetting to him   History of chicken pox    HLD (hyperlipidemia)    HTN (hypertension)    NSTEMI (non-ST elevated myocardial infarction) (HCC) 02/21/2017   Obesity    Stroke, hemorrhagic (HCC) 2007   thought 2/2 HTN (240sbp); residual cognitive impairment, loss of R peripheral field, no driving   Past Surgical History:  Procedure Laterality Date   ANKLE SURGERY  1990s   right foot with plate and screws   CORONARY ARTERY BYPASS GRAFT N/A 02/24/2017   3v Procedure: CORONARY ARTERY BYPASS GRAFTING (CABG) x 3 ON PUMP USING LEFT INTERNAL MAMMARY ARTERY TO LEFT ANTERIOR DESENDING CORNARY ARTERY, RIGHT GREATER SAPHENOUS VEIN TO LEFT CIRCUMFLEX ARTERY AND POSTERIOR DESENDING ARTERY. RIGHT GREATER SAPHENOUS VEIN OBTAINED VIA ENDOVEIN HARVEST.;  Surgeon: Delight OvensGerhardt, Edward B, MD   IABP INSERTION N/A 02/21/2017   Procedure: IABP Insertion;  Surgeon: Yvonne KendallEnd, Christopher, MD;  Location: [facility];  Service: Cardiovascular;  Laterality: N/A;   LEFT HEART CATH AND CORONARY ANGIOGRAPHY N/A 02/21/2017   Procedure: LEFT HEART CATH AND CORONARY ANGIOGRAPHY;  Surgeon: Yvonne KendallEnd, Christopher, MD;  Location: [facility];  Service: Cardiovascular;  Laterality: N/A;   TEE WITHOUT CARDIOVERSION N/A 02/24/2017   Procedure: TRANSESOPHAGEAL ECHOCARDIOGRAM (TEE);  Surgeon: Delight OvensGerhardt, Edward B, MD;  [facility];  Service: Open Heart Surgery;  Laterality: N/A;   Family History  Problem Relation Age of Onset   Alzheimer's disease Maternal Grandfather  Cancer Mother        lymphoma   Alcohol abuse Father        smoker   Coronary artery disease Neg Hx    Stroke Neg Hx    Diabetes Neg Hx    Social History   Socioeconomic History   Marital status: Single    Spouse name: Not on file   Number of children: 2   Years of education: Not on file   Highest education level: 10th grade  Occupational History    Comment: Unemployed   Ecologist strain: Not  on file   Food insecurity:    Worry: Not on file    Inability: Not on file   Transportation needs:    Medical: Not on file    Non-medical: Not on file  Tobacco Use   Smoking status: Never Smoker   Smokeless tobacco: Former Engineer, water and Sexual Activity   Alcohol use: Not Currently   Drug use: No   Sexual activity: Not Currently  Lifestyle   Physical activity:    Days per week: Not on file    Minutes per session: Not on file   Stress: Not on file  Relationships   Social connections:    Talks on phone: Not on file    Gets together: Not on file    Attends religious service: Not on file    Active member of club or organization: Not on file    Attends meetings of clubs or organizations: Not on file    Relationship status: Not on file  Other Topics Concern   Not on file  Social History Narrative   Caffeine: 1 pot coffee   Lives with oldest sister in [place]   Occupation: disabled, was Corporate investment banker   Does not drive.   Activity: no regular exercise   Diet: good water, drinks V8 juice   Right handed     Outpatient Encounter Medications as of 06/17/2018  Medication Sig   aspirin 81 MG EC tablet Take 1 tablet (81 mg total) by mouth daily.   atorvastatin (LIPITOR) 80 MG tablet TAKE 1 TABLET (80 MG TOTAL) BY MOUTH DAILY. FOR CHOLESTEROL   carvedilol (COREG) 3.125 MG tablet Take 1 tablet (3.125 mg total) by mouth 2 (two) times daily.   Cyanocobalamin (B-12) 1000 MCG SUBL Place 1 tablet under the tongue daily.   Lacosamide (VIMPAT) 100 MG TABS Take 1 tablet (100 mg total) by mouth 2 (two) times daily.   levETIRAcetam (KEPPRA) 750 MG tablet Take 2 tablets (1,500 mg total) by mouth 2 (two) times daily.   Multiple Vitamin (MULTIVITAMIN WITH MINERALS) TABS tablet Take 1 tablet by mouth daily.   sertraline (ZOLOFT) 25 MG tablet Take 0.5 tablets (12.5 mg total) by mouth at bedtime.   ferrous sulfate 325 (65 FE) MG tablet Take 1 tablet (325 mg total) by mouth  daily with breakfast for 30 days.   No facility-administered encounter medications on file as of 06/17/2018.     Activities of Daily Living In your present state of health, do you have any difficulty performing the following activities: 06/17/2018 08/11/2017  Hearing? N -  Vision? N -  Difficulty concentrating or making decisions? Y -  Walking or climbing stairs? N -  Dressing or bathing? N -  Doing errands, shopping? [name] Cruz  Preparing Food and eating ? N -  Using the Toilet? N -  In the past six months, have you accidently leaked urine?  N -  Do you have problems with loss of bowel control? N -  Managing your Medications? N -  Managing your Finances? N -  Housekeeping or managing your Housekeeping? N -  Some recent data might be hidden    Patient Care Team: [name], MD as PCP - General (Family Medicine) Corky Crafts, MD as PCP - Cardiology (Cardiology)   Assessment:   This is a routine wellness examination for [place].  Vision Screening Comments: Last vision exam approx. 2 yrs ago  Exercise Activities and Dietary recommendations Current Exercise Habits: The patient does not participate in regular exercise at present, Exercise limited by: None identified  Goals     Patient Stated     Starting 06/17/18, I will continue to take medications as prescribed.        Fall Risk Fall Risk  06/17/2018 01/15/2018 01/28/2017 10/20/2015 10/17/2014  Falls in the past year? 0 0 No No No   Depression Screen PHQ 2/9 Scores 06/17/2018 01/28/2017 10/20/2015 10/17/2014  PHQ - 2 Score 0 0 0 0  PHQ- 9 Score 0 - - -    Cognitive Function - Dx of Cognitive Impairment MMSE - Mini Mental State Exam 06/17/2018  Not completed: (No Data)        Immunization History  Administered Date(s) Administered   Influenza, High Dose Seasonal PF 12/30/2016   Influenza, Seasonal, Injecte, Preservative Fre 09/02/2012   Influenza,inj,Quad PF,6+ Mos 10/17/2014, 10/20/2015, 10/03/2017    Influenza-Unspecified 10/02/2013   Pneumococcal Polysaccharide-23 04/27/2012   Td 10/28/2012    Screening Tests Health Maintenance  Topic Date Due   COLON CANCER SCREENING ANNUAL FOBT  01/14/2019 (Originally 02/14/2018)   COLONOSCOPY  01/14/2020 (Originally 05/06/2005)   DTaP/Tdap/Td (1 - Tdap) 10/29/2022 (Originally 05/07/1974)   INFLUENZA VACCINE  08/15/2018   TETANUS/TDAP  10/29/2022   Hepatitis C Screening  Completed   HIV Screening  Completed       Plan:     I have personally reviewed, addressed, and noted the following in the patients chart:  A. Medical and social history B. Use of alcohol, tobacco or illicit drugs  C. Current medications and supplements D. Functional ability and status E.  Nutritional status F.  Physical activity G. Advance directives H. List of other physicians I.  Hospitalizations, surgeries, and ER visits in previous 12 months J.  Vitals (unless it is a telemedicine encounter) K. Screenings to include cognitive, depression, hearing, vision (NOTE: hearing and vision screenings not completed in telemedicine encounter) L. Referrals and appointments   In addition, I have reviewed and discussed with patient certain preventive protocols, quality metrics, and best practice recommendations. A written personalized care plan for preventive services and recommendations were provided to patient.  With patient's permission, we connected on 06/17/18 at  9:30 AM EDT. Interactive audio and video telecommunications were attempted with patient. This attempt was unsuccessful due to patient having technical difficulties OR patient did not have access to video capability.  Encounter was completed with audio only.  Two patient identifiers were used to ensure the encounter occurred with the correct person.  Patient was in home and writer was in office.   Signed,   Randa Evens, MHA, BS, LPN Health Coach

## 2018-06-18 ENCOUNTER — Other Ambulatory Visit (INDEPENDENT_AMBULATORY_CARE_PROVIDER_SITE_OTHER): Payer: Medicare Other

## 2018-06-18 DIAGNOSIS — Z125 Encounter for screening for malignant neoplasm of prostate: Secondary | ICD-10-CM

## 2018-06-18 DIAGNOSIS — E782 Mixed hyperlipidemia: Secondary | ICD-10-CM

## 2018-06-18 DIAGNOSIS — G40909 Epilepsy, unspecified, not intractable, without status epilepticus: Secondary | ICD-10-CM

## 2018-06-18 DIAGNOSIS — I10 Essential (primary) hypertension: Secondary | ICD-10-CM

## 2018-06-18 LAB — CBC WITH DIFFERENTIAL/PLATELET
Basophils Absolute: 0 10*3/uL (ref 0.0–0.1)
Basophils Relative: 0.6 % (ref 0.0–3.0)
Eosinophils Absolute: 0.1 10*3/uL (ref 0.0–0.7)
Eosinophils Relative: 1.2 % (ref 0.0–5.0)
HCT: 40.2 % (ref 39.0–52.0)
Hemoglobin: 13.7 g/dL (ref 13.0–17.0)
Lymphocytes Relative: 42.5 % (ref 12.0–46.0)
Lymphs Abs: 2.5 10*3/uL (ref 0.7–4.0)
MCHC: 34 g/dL (ref 30.0–36.0)
MCV: 90.9 fl (ref 78.0–100.0)
Monocytes Absolute: 0.6 10*3/uL (ref 0.1–1.0)
Monocytes Relative: 10.4 % (ref 3.0–12.0)
Neutro Abs: 2.6 10*3/uL (ref 1.4–7.7)
Neutrophils Relative %: 45.3 % (ref 43.0–77.0)
Platelets: 233 10*3/uL (ref 150.0–400.0)
RBC: 4.42 Mil/uL (ref 4.22–5.81)
RDW: 13 % (ref 11.5–15.5)
WBC: 5.8 10*3/uL (ref 4.0–10.5)

## 2018-06-18 LAB — MICROALBUMIN / CREATININE URINE RATIO
Creatinine,U: 67.7 mg/dL
Microalb Creat Ratio: 1 mg/g (ref 0.0–30.0)
Microalb, Ur: <0.7 mg/dL (ref 0.0–1.9)

## 2018-06-18 LAB — COMPREHENSIVE METABOLIC PANEL
ALT: 18 U/L (ref 0–53)
AST: 15 U/L (ref 0–37)
Albumin: 4.3 g/dL (ref 3.5–5.2)
Alkaline Phosphatase: 59 U/L (ref 39–117)
BUN: 10 mg/dL (ref 6–23)
CO2: 31 mEq/L (ref 19–32)
Calcium: 9.4 mg/dL (ref 8.4–10.5)
Chloride: 103 mEq/L (ref 96–112)
Creatinine, Ser: 0.75 mg/dL (ref 0.40–1.50)
GFR: 105.15 mL/min (ref >60.00)
Glucose, Bld: 89 mg/dL (ref 70–99)
Potassium: 5.2 mEq/L — ABNORMAL HIGH (ref 3.5–5.1)
Sodium: 140 mEq/L (ref 135–145)
Total Bilirubin: 0.6 mg/dL (ref 0.2–1.2)
Total Protein: 6.6 g/dL (ref 6.0–8.3)

## 2018-06-18 LAB — PSA: PSA: 0.45 ng/mL (ref 0.10–4.00)

## 2018-06-18 LAB — LIPID PANEL
Cholesterol: 149 mg/dL (ref 0–200)
HDL: 39.9 mg/dL (ref >39.00)
LDL Cholesterol: 94 mg/dL (ref 0–99)
NonHDL: 108.77
Total CHOL/HDL Ratio: 4
Triglycerides: 76 mg/dL (ref 0.0–149.0)
VLDL: 15.2 mg/dL (ref 0.0–40.0)

## 2018-06-18 LAB — TSH: TSH: 1.24 u[IU]/mL (ref 0.35–4.50)

## 2018-06-22 ENCOUNTER — Encounter: Payer: Self-pay | Admitting: Family Medicine

## 2018-06-22 ENCOUNTER — Ambulatory Visit (INDEPENDENT_AMBULATORY_CARE_PROVIDER_SITE_OTHER): Payer: Medicare Other | Admitting: Family Medicine

## 2018-06-22 VITALS — BP 113/69 | HR 71 | Ht 63.0 in

## 2018-06-22 DIAGNOSIS — G40909 Epilepsy, unspecified, not intractable, without status epilepticus: Secondary | ICD-10-CM | POA: Diagnosis not present

## 2018-06-22 DIAGNOSIS — Z1211 Encounter for screening for malignant neoplasm of colon: Secondary | ICD-10-CM

## 2018-06-22 DIAGNOSIS — F1010 Alcohol abuse, uncomplicated: Secondary | ICD-10-CM | POA: Diagnosis not present

## 2018-06-22 DIAGNOSIS — R4189 Other symptoms and signs involving cognitive functions and awareness: Secondary | ICD-10-CM | POA: Diagnosis not present

## 2018-06-22 DIAGNOSIS — G9389 Other specified disorders of brain: Secondary | ICD-10-CM | POA: Diagnosis not present

## 2018-06-22 DIAGNOSIS — E782 Mixed hyperlipidemia: Secondary | ICD-10-CM

## 2018-06-22 DIAGNOSIS — I10 Essential (primary) hypertension: Secondary | ICD-10-CM

## 2018-06-22 MED ORDER — LACOSAMIDE 100 MG PO TABS: 100.0000 mg | ORAL_TABLET | Freq: Two times a day (BID) | ORAL | 0 refills | Status: DC

## 2018-06-22 NOTE — Assessment & Plan Note (Signed)
Abstinent since last year

## 2018-06-22 NOTE — Assessment & Plan Note (Signed)
BP well controlled at this time. Continue current regimen (carvedilol).

## 2018-06-22 NOTE — Progress Notes (Signed)
[name] Cruz - 63 y.o. male  MRN [id number]  Date of Birth: October 02, 1955  PCP: Ria Bush, MD  This service was provided via telemedicine. Phone Visit performed on 06/22/2018    Rationale for phone visit along with limitations reviewed. Patient consented to telephone encounter.    Location of patient: home, Diane sister also on phone call with him Location of provider: in office, [place] [facility] Name of referring provider: N/A   Names of persons and role in encounter: Provider: Ria Bush, MD  Patient: [name] Cruz  Other: N/A  Time on call: 3:12pm - 3:28pm  Subjective: Chief Complaint  Patient presents with  . Annual Exam    CPE--Dian will have BP for Dr. Darnell Level at 3.      HPI:  Happy his vegetable garden is coming along.   H/o L parietal hemorrhagic stroke 2007 - from uncontrolled hypertension. Residual congitive impairment from encephalomalacia. Lives in [place] with older handicapped sister.  Complicated year this past year - grand mal seizure (status epilepticus) with Vtach cardiac arrest s/p CABG 03/2017 then ?alcohol related seizure, complicated by LUE DVT s/p 3 months of eliquis. MRI also showed ischemic R parietal infarct.   Saw Lesia last week for medicare wellness visit. Note reviewed.   iFOB will be mailed to patient.    Preventative: Colon cancer screening -iFOB yearly Prostate cancer screening -reassuring last year - rpt next year Flu shotyearly Td - 2014  Pneumovax - 2014 shingrix - discussed, declines  Advanced directive received and scanned 07/2016. Sister Deveron Furlong is general POA. Ok with CPR but doesn't want intubation or feeding tube.  Seat belt use discussed Sunscreen use discussed, no changing moles on skin.  No smoking.  Alcohol - previously 4-6 beers a day - now abstinent for last 2 yrs Dentist - has not seen, has upper dentures Eye exam - yearly   Caffeine: 1 pot coffee Lives with oldest sister Occupation:  disabled, was Nature conservation officer Does not drive. Activity: no regular exercise Diet: good water, drinks V8 juice   Objective/Observations:   No physical exam or vital signs collected unless specifically identified below.   BP 113/69   Pulse 71   Ht 5\' 3"  (1.6 m)   BMI 32.77 kg/m    Respiratory status: speaks in complete sentences without evident shortness of breath.   Assessment/Plan:  Alcohol abuse Abstinent since last year  HLD (hyperlipidemia) Chronic, continue lipitor.  The ASCVD Risk score Mikey Bussing DC Jr., et al., 2013) failed to calculate for the following reasons:   The patient has a prior MI or stroke diagnosis   HTN (hypertension) BP well controlled at this time. Continue current regimen (carvedilol).   Seizure disorder ([place]) Denies recurrent seizure. Now on keppra and vimpat - refilled x 2 months while he gets back in with neurology.    I discussed the assessment and treatment plan with the patient. The patient was provided an opportunity to ask questions and all were answered. The patient agreed with the plan and demonstrated an understanding of the instructions.   Lab Orders     Fecal occult blood, imunochemical  Meds ordered this encounter  Medications  . Lacosamide (VIMPAT) 100 MG TABS    Sig: Take 1 tablet (100 mg total) by mouth 2 (two) times daily.    Dispense:  180 tablet    Refill:  0    Future refills through neurology    RTC 6 mo f/u visit.   The  patient was advised to call back or seek an in-person evaluation if the symptoms worsen or if the condition fails to improve as anticipated.  Eustaquio BoydenJavier Malyia Moro, MD

## 2018-06-22 NOTE — Assessment & Plan Note (Addendum)
Denies recurrent seizure. Now on keppra and vimpat - refilled x 2 months while he gets back in with neurology.

## 2018-06-22 NOTE — Assessment & Plan Note (Signed)
Chronic, continue lipitor.  The ASCVD Risk score Mikey Bussing DC Jr., et al., 2013) failed to calculate for the following reasons:   The patient has a prior MI or stroke diagnosis

## 2018-06-23 ENCOUNTER — Telehealth: Payer: Self-pay

## 2018-06-23 NOTE — Telephone Encounter (Signed)
Plz mail iFOB kit to pt.

## 2018-07-06 ENCOUNTER — Telehealth: Payer: Self-pay | Admitting: Adult Health

## 2018-07-06 ENCOUNTER — Telehealth: Payer: Self-pay | Admitting: Family Medicine

## 2018-07-06 NOTE — Telephone Encounter (Signed)
Patient's sister called and said patient needs a refill sent in to [facility].  Levletiracetam 750 mg  and Atorvastatin 80 mg.

## 2018-07-06 NOTE — Telephone Encounter (Signed)
Pt sister has called for a refill on pt's medications:Lacosamide (VIMPAT) 100 MG TABS sertraline (ZOLOFT) 25 MG tablet  [facility]

## 2018-07-06 NOTE — Telephone Encounter (Signed)
Sister advised that Keppra is managed through neurologist, and zoloft has a years worth of refills on file with [facility] she just needs to call them directly to request a refill

## 2018-07-06 NOTE — Telephone Encounter (Signed)
I called pts sister [name] on dpr about her other sister Beverlee Nims called this am about pt needing refills on vimpat and zoloft. I stated pts pcp manage his sertraline. ALso I stated the vimpat refill was done 06/22/2018 and sent for 3 months to [facility]. I explain per our note only [name] is allowed to manage medications. [name] stated she manage her brother meds and not Beverlee Nims. She appreciate the call and verbalized understanding.

## 2018-07-08 ENCOUNTER — Emergency Department (HOSPITAL_COMMUNITY): Payer: Medicare Other

## 2018-07-08 ENCOUNTER — Inpatient Hospital Stay (HOSPITAL_COMMUNITY)
Admission: EM | Admit: 2018-07-08 | Discharge: 2018-07-10 | DRG: 064 | Disposition: A | Discharge status: Home / Self Care | Payer: Medicare Other | Attending: Internal Medicine | Admitting: Internal Medicine

## 2018-07-08 ENCOUNTER — Other Ambulatory Visit: Payer: Self-pay

## 2018-07-08 ENCOUNTER — Encounter (HOSPITAL_COMMUNITY): Payer: Self-pay | Admitting: *Deleted

## 2018-07-08 DIAGNOSIS — R4189 Other symptoms and signs involving cognitive functions and awareness: Secondary | ICD-10-CM | POA: Diagnosis present

## 2018-07-08 DIAGNOSIS — Z6832 Body mass index (BMI) 32.0-32.9, adult: Secondary | ICD-10-CM

## 2018-07-08 DIAGNOSIS — R402142 Coma scale, eyes open, spontaneous, at arrival to emergency department: Secondary | ICD-10-CM | POA: Diagnosis not present

## 2018-07-08 DIAGNOSIS — Z888 Allergy status to other drugs, medicaments and biological substances status: Secondary | ICD-10-CM

## 2018-07-08 DIAGNOSIS — I10 Essential (primary) hypertension: Secondary | ICD-10-CM | POA: Diagnosis present

## 2018-07-08 DIAGNOSIS — R29702 NIHSS score 2: Secondary | ICD-10-CM | POA: Diagnosis not present

## 2018-07-08 DIAGNOSIS — D649 Anemia, unspecified: Secondary | ICD-10-CM | POA: Diagnosis not present

## 2018-07-08 DIAGNOSIS — Z79899 Other long term (current) drug therapy: Secondary | ICD-10-CM

## 2018-07-08 DIAGNOSIS — I639 Cerebral infarction, unspecified: Secondary | ICD-10-CM

## 2018-07-08 DIAGNOSIS — G9389 Other specified disorders of brain: Secondary | ICD-10-CM | POA: Diagnosis not present

## 2018-07-08 DIAGNOSIS — F1010 Alcohol abuse, uncomplicated: Secondary | ICD-10-CM | POA: Diagnosis present

## 2018-07-08 DIAGNOSIS — I63449 Cerebral infarction due to embolism of unspecified cerebellar artery: Principal | ICD-10-CM | POA: Diagnosis present

## 2018-07-08 DIAGNOSIS — Z811 Family history of alcohol abuse and dependence: Secondary | ICD-10-CM

## 2018-07-08 DIAGNOSIS — I340 Nonrheumatic mitral (valve) insufficiency: Secondary | ICD-10-CM | POA: Diagnosis not present

## 2018-07-08 DIAGNOSIS — G92 Toxic encephalopathy: Secondary | ICD-10-CM | POA: Diagnosis not present

## 2018-07-08 DIAGNOSIS — R0902 Hypoxemia: Secondary | ICD-10-CM | POA: Diagnosis not present

## 2018-07-08 DIAGNOSIS — I480 Paroxysmal atrial fibrillation: Secondary | ICD-10-CM | POA: Diagnosis present

## 2018-07-08 DIAGNOSIS — R29810 Facial weakness: Secondary | ICD-10-CM | POA: Diagnosis not present

## 2018-07-08 DIAGNOSIS — H5461 Unqualified visual loss, right eye, normal vision left eye: Secondary | ICD-10-CM | POA: Diagnosis not present

## 2018-07-08 DIAGNOSIS — G40909 Epilepsy, unspecified, not intractable, without status epilepticus: Secondary | ICD-10-CM

## 2018-07-08 DIAGNOSIS — R4781 Slurred speech: Secondary | ICD-10-CM | POA: Diagnosis not present

## 2018-07-08 DIAGNOSIS — Z86718 Personal history of other venous thrombosis and embolism: Secondary | ICD-10-CM

## 2018-07-08 DIAGNOSIS — I252 Old myocardial infarction: Secondary | ICD-10-CM | POA: Diagnosis not present

## 2018-07-08 DIAGNOSIS — E669 Obesity, unspecified: Secondary | ICD-10-CM | POA: Diagnosis present

## 2018-07-08 DIAGNOSIS — E785 Hyperlipidemia, unspecified: Secondary | ICD-10-CM | POA: Diagnosis not present

## 2018-07-08 DIAGNOSIS — Z7982 Long term (current) use of aspirin: Secondary | ICD-10-CM

## 2018-07-08 DIAGNOSIS — I69320 Aphasia following cerebral infarction: Secondary | ICD-10-CM

## 2018-07-08 DIAGNOSIS — I69391 Dysphagia following cerebral infarction: Secondary | ICD-10-CM

## 2018-07-08 DIAGNOSIS — I2510 Atherosclerotic heart disease of native coronary artery without angina pectoris: Secondary | ICD-10-CM | POA: Diagnosis not present

## 2018-07-08 DIAGNOSIS — R569 Unspecified convulsions: Secondary | ICD-10-CM

## 2018-07-08 DIAGNOSIS — Z951 Presence of aortocoronary bypass graft: Secondary | ICD-10-CM | POA: Diagnosis not present

## 2018-07-08 DIAGNOSIS — I6523 Occlusion and stenosis of bilateral carotid arteries: Secondary | ICD-10-CM | POA: Diagnosis not present

## 2018-07-08 DIAGNOSIS — Z1159 Encounter for screening for other viral diseases: Secondary | ICD-10-CM

## 2018-07-08 DIAGNOSIS — Z03818 Encounter for observation for suspected exposure to other biological agents ruled out: Secondary | ICD-10-CM | POA: Diagnosis not present

## 2018-07-08 DIAGNOSIS — R471 Dysarthria and anarthria: Secondary | ICD-10-CM | POA: Diagnosis not present

## 2018-07-08 DIAGNOSIS — R404 Transient alteration of awareness: Secondary | ICD-10-CM | POA: Diagnosis not present

## 2018-07-08 DIAGNOSIS — R1310 Dysphagia, unspecified: Secondary | ICD-10-CM | POA: Diagnosis present

## 2018-07-08 DIAGNOSIS — R402252 Coma scale, best verbal response, oriented, at arrival to emergency department: Secondary | ICD-10-CM | POA: Diagnosis present

## 2018-07-08 DIAGNOSIS — Z87891 Personal history of nicotine dependence: Secondary | ICD-10-CM

## 2018-07-08 DIAGNOSIS — R402362 Coma scale, best motor response, obeys commands, at arrival to emergency department: Secondary | ICD-10-CM | POA: Diagnosis not present

## 2018-07-08 LAB — DIFFERENTIAL
Abs Immature Granulocytes: 0.03 10*3/uL (ref 0.00–0.07)
Basophils Absolute: 0 10*3/uL (ref 0.0–0.1)
Basophils Relative: 0 %
Eosinophils Absolute: 0.1 10*3/uL (ref 0.0–0.5)
Eosinophils Relative: 1 %
Immature Granulocytes: 0 %
Lymphocytes Relative: 25 %
Lymphs Abs: 2.3 10*3/uL (ref 0.7–4.0)
Monocytes Absolute: 1.3 10*3/uL — ABNORMAL HIGH (ref 0.1–1.0)
Monocytes Relative: 15 %
Neutro Abs: 5.4 10*3/uL (ref 1.7–7.7)
Neutrophils Relative %: 59 %

## 2018-07-08 LAB — COMPREHENSIVE METABOLIC PANEL
ALT: 18 U/L (ref 0–44)
AST: 23 U/L (ref 15–41)
Albumin: 3.9 g/dL (ref 3.5–5.0)
Alkaline Phosphatase: 53 U/L (ref 38–126)
Anion gap: 10 (ref 5–15)
BUN: 5 mg/dL — ABNORMAL LOW (ref 8–23)
CO2: 24 mmol/L (ref 22–32)
Calcium: 8.9 mg/dL (ref 8.9–10.3)
Chloride: 101 mmol/L (ref 98–111)
Creatinine, Ser: 0.79 mg/dL (ref 0.61–1.24)
GFR calc Af Amer: >60 mL/min (ref >60)
GFR calc non Af Amer: >60 mL/min (ref >60)
Glucose, Bld: 105 mg/dL — ABNORMAL HIGH (ref 70–99)
Potassium: 3.9 mmol/L (ref 3.5–5.1)
Sodium: 135 mmol/L (ref 135–145)
Total Bilirubin: 0.9 mg/dL (ref 0.3–1.2)
Total Protein: 6.3 g/dL — ABNORMAL LOW (ref 6.5–8.1)

## 2018-07-08 LAB — I-STAT CHEM 8, ED
BUN: 4 mg/dL — ABNORMAL LOW (ref 8–23)
Calcium, Ion: 1.09 mmol/L — ABNORMAL LOW (ref 1.15–1.40)
Chloride: 100 mmol/L (ref 98–111)
Creatinine, Ser: 0.7 mg/dL (ref 0.61–1.24)
Glucose, Bld: 87 mg/dL (ref 70–99)
HCT: 44 % (ref 39.0–52.0)
Hemoglobin: 15 g/dL (ref 13.0–17.0)
Potassium: 3.4 mmol/L — ABNORMAL LOW (ref 3.5–5.1)
Sodium: 137 mmol/L (ref 135–145)
TCO2: 26 mmol/L (ref 22–32)

## 2018-07-08 LAB — CBC
HCT: 38.8 % — ABNORMAL LOW (ref 39.0–52.0)
Hemoglobin: 12.6 g/dL — ABNORMAL LOW (ref 13.0–17.0)
MCH: 30.1 pg (ref 26.0–34.0)
MCHC: 32.5 g/dL (ref 30.0–36.0)
MCV: 92.8 fL (ref 80.0–100.0)
Platelets: 190 10*3/uL (ref 150–400)
RBC: 4.18 MIL/uL — ABNORMAL LOW (ref 4.22–5.81)
RDW: 12 % (ref 11.5–15.5)
WBC: 9.2 10*3/uL (ref 4.0–10.5)
nRBC: 0 % (ref 0.0–0.2)

## 2018-07-08 LAB — CBG MONITORING, ED: Glucose-Capillary: 85 mg/dL (ref 70–99)

## 2018-07-08 LAB — ETHANOL: Alcohol, Ethyl (B): <10 mg/dL (ref <10)

## 2018-07-08 MED ORDER — IOHEXOL 350 MG/ML SOLN
75.0000 mL | Freq: Once | INTRAVENOUS | Status: AC | PRN
  Administered 2018-07-08: 75 mL via INTRAVENOUS

## 2018-07-08 NOTE — ED Notes (Signed)
Pt alert no distress no pain anywhere

## 2018-07-08 NOTE — ED Triage Notes (Signed)
The pt arrived by gems after the pts sister called ems .  She lives with the patient she called ems forrt facial droop and slurred speech  Symptoms were observed 2130  The pt reports that he is normally this way  He has had strokes in the past and these are  Residual from that

## 2018-07-08 NOTE — ED Notes (Signed)
[name] Cruz, [phone] for updates

## 2018-07-08 NOTE — ED Notes (Signed)
[name] Cruz [phone] pts mother wants an update when available

## 2018-07-09 ENCOUNTER — Other Ambulatory Visit: Payer: Self-pay

## 2018-07-09 ENCOUNTER — Encounter (HOSPITAL_COMMUNITY): Payer: Self-pay | Admitting: Internal Medicine

## 2018-07-09 ENCOUNTER — Observation Stay (HOSPITAL_COMMUNITY): Payer: Medicare Other

## 2018-07-09 ENCOUNTER — Emergency Department (HOSPITAL_COMMUNITY): Payer: Medicare Other

## 2018-07-09 ENCOUNTER — Inpatient Hospital Stay (HOSPITAL_COMMUNITY): Payer: Medicare Other

## 2018-07-09 DIAGNOSIS — R29702 NIHSS score 2: Secondary | ICD-10-CM | POA: Diagnosis present

## 2018-07-09 DIAGNOSIS — I2510 Atherosclerotic heart disease of native coronary artery without angina pectoris: Secondary | ICD-10-CM | POA: Diagnosis present

## 2018-07-09 DIAGNOSIS — F1010 Alcohol abuse, uncomplicated: Secondary | ICD-10-CM | POA: Diagnosis present

## 2018-07-09 DIAGNOSIS — I10 Essential (primary) hypertension: Secondary | ICD-10-CM | POA: Diagnosis not present

## 2018-07-09 DIAGNOSIS — R402252 Coma scale, best verbal response, oriented, at arrival to emergency department: Secondary | ICD-10-CM | POA: Diagnosis present

## 2018-07-09 DIAGNOSIS — R402142 Coma scale, eyes open, spontaneous, at arrival to emergency department: Secondary | ICD-10-CM | POA: Diagnosis present

## 2018-07-09 DIAGNOSIS — I639 Cerebral infarction, unspecified: Secondary | ICD-10-CM | POA: Diagnosis not present

## 2018-07-09 DIAGNOSIS — Z951 Presence of aortocoronary bypass graft: Secondary | ICD-10-CM | POA: Diagnosis not present

## 2018-07-09 DIAGNOSIS — Z1159 Encounter for screening for other viral diseases: Secondary | ICD-10-CM | POA: Diagnosis not present

## 2018-07-09 DIAGNOSIS — R402362 Coma scale, best motor response, obeys commands, at arrival to emergency department: Secondary | ICD-10-CM | POA: Diagnosis present

## 2018-07-09 DIAGNOSIS — R471 Dysarthria and anarthria: Secondary | ICD-10-CM | POA: Diagnosis present

## 2018-07-09 DIAGNOSIS — D649 Anemia, unspecified: Secondary | ICD-10-CM | POA: Diagnosis present

## 2018-07-09 DIAGNOSIS — Z86718 Personal history of other venous thrombosis and embolism: Secondary | ICD-10-CM | POA: Diagnosis not present

## 2018-07-09 DIAGNOSIS — I340 Nonrheumatic mitral (valve) insufficiency: Secondary | ICD-10-CM | POA: Diagnosis not present

## 2018-07-09 DIAGNOSIS — R29810 Facial weakness: Secondary | ICD-10-CM | POA: Diagnosis present

## 2018-07-09 DIAGNOSIS — E669 Obesity, unspecified: Secondary | ICD-10-CM | POA: Diagnosis present

## 2018-07-09 DIAGNOSIS — I69320 Aphasia following cerebral infarction: Secondary | ICD-10-CM | POA: Diagnosis not present

## 2018-07-09 DIAGNOSIS — G9389 Other specified disorders of brain: Secondary | ICD-10-CM | POA: Diagnosis present

## 2018-07-09 DIAGNOSIS — G92 Toxic encephalopathy: Secondary | ICD-10-CM | POA: Diagnosis present

## 2018-07-09 DIAGNOSIS — Z6832 Body mass index (BMI) 32.0-32.9, adult: Secondary | ICD-10-CM | POA: Diagnosis not present

## 2018-07-09 DIAGNOSIS — E785 Hyperlipidemia, unspecified: Secondary | ICD-10-CM | POA: Diagnosis present

## 2018-07-09 DIAGNOSIS — H5461 Unqualified visual loss, right eye, normal vision left eye: Secondary | ICD-10-CM | POA: Diagnosis present

## 2018-07-09 DIAGNOSIS — G40909 Epilepsy, unspecified, not intractable, without status epilepticus: Secondary | ICD-10-CM | POA: Diagnosis present

## 2018-07-09 DIAGNOSIS — R4189 Other symptoms and signs involving cognitive functions and awareness: Secondary | ICD-10-CM | POA: Diagnosis present

## 2018-07-09 DIAGNOSIS — I252 Old myocardial infarction: Secondary | ICD-10-CM | POA: Diagnosis not present

## 2018-07-09 LAB — COMPREHENSIVE METABOLIC PANEL
ALT: 18 U/L (ref 0–44)
AST: 20 U/L (ref 15–41)
Albumin: 4 g/dL (ref 3.5–5.0)
Alkaline Phosphatase: 64 U/L (ref 38–126)
Anion gap: 8 (ref 5–15)
BUN: <5 mg/dL — ABNORMAL LOW (ref 8–23)
CO2: 25 mmol/L (ref 22–32)
Calcium: 9.1 mg/dL (ref 8.9–10.3)
Chloride: 107 mmol/L (ref 98–111)
Creatinine, Ser: 0.71 mg/dL (ref 0.61–1.24)
GFR calc Af Amer: >60 mL/min (ref >60)
GFR calc non Af Amer: >60 mL/min (ref >60)
Glucose, Bld: 103 mg/dL — ABNORMAL HIGH (ref 70–99)
Potassium: 3.7 mmol/L (ref 3.5–5.1)
Sodium: 140 mmol/L (ref 135–145)
Total Bilirubin: 0.6 mg/dL (ref 0.3–1.2)
Total Protein: 6.3 g/dL — ABNORMAL LOW (ref 6.5–8.1)

## 2018-07-09 LAB — LIPID PANEL
Cholesterol: 120 mg/dL (ref 0–200)
HDL: 35 mg/dL — ABNORMAL LOW (ref >40)
LDL Cholesterol: 69 mg/dL (ref 0–99)
Total CHOL/HDL Ratio: 3.4 RATIO
Triglycerides: 78 mg/dL (ref <150)
VLDL: 16 mg/dL (ref 0–40)

## 2018-07-09 LAB — CBC
HCT: 37.9 % — ABNORMAL LOW (ref 39.0–52.0)
Hemoglobin: 12.6 g/dL — ABNORMAL LOW (ref 13.0–17.0)
MCH: 30 pg (ref 26.0–34.0)
MCHC: 33.2 g/dL (ref 30.0–36.0)
MCV: 90.2 fL (ref 80.0–100.0)
Platelets: 200 10*3/uL (ref 150–400)
RBC: 4.2 MIL/uL — ABNORMAL LOW (ref 4.22–5.81)
RDW: 12 % (ref 11.5–15.5)
WBC: 7 10*3/uL (ref 4.0–10.5)
nRBC: 0 % (ref 0.0–0.2)

## 2018-07-09 LAB — URINALYSIS, ROUTINE W REFLEX MICROSCOPIC
Bilirubin Urine: NEGATIVE
Glucose, UA: NEGATIVE mg/dL
Hgb urine dipstick: NEGATIVE
Ketones, ur: NEGATIVE mg/dL
Leukocytes,Ua: NEGATIVE
Nitrite: NEGATIVE
Protein, ur: NEGATIVE mg/dL
Specific Gravity, Urine: 1.014 (ref 1.005–1.030)
pH: 7 (ref 5.0–8.0)

## 2018-07-09 LAB — PROTIME-INR
INR: 1.2 (ref 0.8–1.2)
Prothrombin Time: 14.6 seconds (ref 11.4–15.2)

## 2018-07-09 LAB — ECHOCARDIOGRAM COMPLETE
Height: 60 in
Weight: 2641.99 oz

## 2018-07-09 LAB — RAPID URINE DRUG SCREEN, HOSP PERFORMED
Amphetamines: NOT DETECTED
Barbiturates: NOT DETECTED
Benzodiazepines: NOT DETECTED
Cocaine: NOT DETECTED
Opiates: NOT DETECTED
Tetrahydrocannabinol: NOT DETECTED

## 2018-07-09 LAB — APTT: aPTT: 33 seconds (ref 24–36)

## 2018-07-09 LAB — SARS CORONAVIRUS 2 BY RT PCR (HOSPITAL ORDER, PERFORMED IN ~~LOC~~ HOSPITAL LAB): SARS Coronavirus 2: NEGATIVE

## 2018-07-09 LAB — HEMOGLOBIN A1C
Hgb A1c MFr Bld: 5.6 % (ref 4.8–5.6)
Mean Plasma Glucose: 114.02 mg/dL

## 2018-07-09 MED ORDER — ATORVASTATIN CALCIUM 80 MG PO TABS
80.0000 mg | ORAL_TABLET | Freq: Every day | ORAL | Status: DC
  Administered 2018-07-09: 17:00:00 80 mg via ORAL
  Filled 2018-07-09: qty 1

## 2018-07-09 MED ORDER — ASPIRIN 325 MG PO TABS
325.0000 mg | ORAL_TABLET | Freq: Every day | ORAL | Status: DC
  Administered 2018-07-09 – 2018-07-10 (×2): 325 mg via ORAL
  Filled 2018-07-09 (×2): qty 1

## 2018-07-09 MED ORDER — SODIUM CHLORIDE 0.9 % IV SOLN
INTRAVENOUS | Status: AC
  Administered 2018-07-09 (×2): via INTRAVENOUS

## 2018-07-09 MED ORDER — ACETAMINOPHEN 650 MG RE SUPP: 650.0000 mg | RECTAL | Status: DC | PRN

## 2018-07-09 MED ORDER — LACOSAMIDE 50 MG PO TABS
100.0000 mg | ORAL_TABLET | Freq: Two times a day (BID) | ORAL | Status: DC
  Administered 2018-07-09 – 2018-07-10 (×3): 100 mg via ORAL
  Filled 2018-07-09 (×3): qty 2

## 2018-07-09 MED ORDER — ASPIRIN 81 MG PO TBEC: 81.0000 mg | DELAYED_RELEASE_TABLET | Freq: Every day | ORAL | Status: DC

## 2018-07-09 MED ORDER — LACOSAMIDE 50 MG PO TABS
100.0000 mg | ORAL_TABLET | Freq: Two times a day (BID) | ORAL | Status: AC
  Administered 2018-07-09: 100 mg via ORAL
  Filled 2018-07-09: qty 2

## 2018-07-09 MED ORDER — STROKE: EARLY STAGES OF RECOVERY BOOK
Freq: Once | Status: AC
  Administered 2018-07-09: 03:00:00 1
  Filled 2018-07-09: qty 1

## 2018-07-09 MED ORDER — ACETAMINOPHEN 325 MG PO TABS: 650.0000 mg | ORAL_TABLET | ORAL | Status: DC | PRN

## 2018-07-09 MED ORDER — LEVETIRACETAM 750 MG PO TABS
1500.0000 mg | ORAL_TABLET | Freq: Two times a day (BID) | ORAL | Status: DC
  Administered 2018-07-09 – 2018-07-10 (×3): 1500 mg via ORAL
  Filled 2018-07-09 (×3): qty 2

## 2018-07-09 MED ORDER — ACETAMINOPHEN 160 MG/5ML PO SOLN: 650.0000 mg | ORAL | Status: DC | PRN

## 2018-07-09 MED ORDER — LEVETIRACETAM 750 MG PO TABS
750.0000 mg | ORAL_TABLET | Freq: Once | ORAL | Status: AC
  Administered 2018-07-09: 750 mg via ORAL
  Filled 2018-07-09: qty 1

## 2018-07-09 MED ORDER — VITAMIN B-12 1000 MCG PO TABS
1000.0000 ug | ORAL_TABLET | Freq: Every day | ORAL | Status: DC
  Administered 2018-07-09 – 2018-07-10 (×2): 1000 ug via ORAL
  Filled 2018-07-09 (×2): qty 1

## 2018-07-09 MED ORDER — SERTRALINE HCL 25 MG PO TABS
12.5000 mg | ORAL_TABLET | Freq: Every day | ORAL | Status: DC
  Administered 2018-07-09: 21:00:00 12.5 mg via ORAL
  Filled 2018-07-09 (×2): qty 0.5

## 2018-07-09 MED ORDER — ADULT MULTIVITAMIN W/MINERALS CH
1.0000 | ORAL_TABLET | Freq: Every day | ORAL | Status: DC
  Administered 2018-07-09 – 2018-07-10 (×2): 1 via ORAL
  Filled 2018-07-09 (×2): qty 1

## 2018-07-09 MED ORDER — CARVEDILOL 3.125 MG PO TABS
3.1250 mg | ORAL_TABLET | Freq: Two times a day (BID) | ORAL | Status: DC
  Administered 2018-07-09 – 2018-07-10 (×3): 3.125 mg via ORAL
  Filled 2018-07-09 (×3): qty 1

## 2018-07-09 MED ORDER — ENOXAPARIN SODIUM 40 MG/0.4ML ~~LOC~~ SOLN
40.0000 mg | SUBCUTANEOUS | Status: DC
  Administered 2018-07-09 – 2018-07-10 (×2): 40 mg via SUBCUTANEOUS
  Filled 2018-07-09 (×2): qty 0.4

## 2018-07-09 MED ORDER — ASPIRIN 300 MG RE SUPP
300.0000 mg | Freq: Every day | RECTAL | Status: DC
  Filled 2018-07-09: qty 1

## 2018-07-09 NOTE — ED Notes (Signed)
Mri has called AND WILL COME GET THE PT

## 2018-07-09 NOTE — ED Notes (Signed)
Pt returned from mri

## 2018-07-09 NOTE — Progress Notes (Signed)
EEG Completed; Results Pending  

## 2018-07-09 NOTE — ED Notes (Signed)
TO MRI

## 2018-07-09 NOTE — H&P (Signed)
History and Physical    [name] MileBilly J Eastham ZOX:[id number] DOB: 1955-02-14 DOA: 07/08/2018  PCP: [name], [name], MD  Patient coming from: Home.  Chief Complaint: Confusion and difficulty speaking.  HPI: [name] Cruz is a 63 y.o. male with history of previous stroke, seizure disorder, CAD status post CABG was found to be confused and speech more slurred than usual by patient's sister at around 9:45 PM last night.  EMS was called and patient was brought to the ER.  Patient states he was doing fine he had just gone to repair something in his septic tank and he took a bath and came back when his family noticed this.  Denies any weakness of the upper or lower extremities.  Patient states he has some difficulty seeing on the right side from previous stroke..  Denies any difficulty swallowing.  ED Course: In the ER patient was afebrile.  COVID test negative.  CT angiogram of the head and neck did not show any large vessel obstruction.  Neurologist on-call was consulted.  Since patient is back to baseline patient was not a candidate for TPA.  MRI brain did show a right cerebellar stroke.  Patient admitted for further work-up.  Review of Systems: As per HPI, rest all negative.   Past Medical History:  Diagnosis Date   Acute deep vein thrombosis (DVT) of left upper extremity (HCC)    L brachial and basilic veins, eliquis started 08/22/2017 to continue for 3 months total    Cognitive impairment 2007   after stroke, saw rehab but told to stop because was too upsetting to him   History of chicken pox    HLD (hyperlipidemia)    HTN (hypertension)    NSTEMI (non-ST elevated myocardial infarction) (HCC) 02/21/2017   Obesity    Stroke, hemorrhagic (HCC) 2007   thought 2/2 HTN (240sbp); residual cognitive impairment, loss of R peripheral field, no driving    Past Surgical History:  Procedure Laterality Date   ANKLE SURGERY  1990s   right foot with plate and screws   CORONARY ARTERY BYPASS  GRAFT N/A 02/24/2017   3v Procedure: CORONARY ARTERY BYPASS GRAFTING (CABG) x 3 ON PUMP USING LEFT INTERNAL MAMMARY ARTERY TO LEFT ANTERIOR DESENDING CORNARY ARTERY, RIGHT GREATER SAPHENOUS VEIN TO LEFT CIRCUMFLEX ARTERY AND POSTERIOR DESENDING ARTERY. RIGHT GREATER SAPHENOUS VEIN OBTAINED VIA ENDOVEIN HARVEST.;  Surgeon: Delight OvensGerhardt, Edward B, MD   IABP INSERTION N/A 02/21/2017   Procedure: IABP Insertion;  Surgeon: Yvonne KendallEnd, Christopher, MD;  Location: [facility];  Service: Cardiovascular;  Laterality: N/A;   LEFT HEART CATH AND CORONARY ANGIOGRAPHY N/A 02/21/2017   Procedure: LEFT HEART CATH AND CORONARY ANGIOGRAPHY;  Surgeon: Yvonne KendallEnd, Christopher, MD;  Location: [facility];  Service: Cardiovascular;  Laterality: N/A;   TEE WITHOUT CARDIOVERSION N/A 02/24/2017   Procedure: TRANSESOPHAGEAL ECHOCARDIOGRAM (TEE);  Surgeon: Delight OvensGerhardt, Edward B, MD;  [facility];  Service: Open Heart Surgery;  Laterality: N/A;     reports that he has never smoked. He has quit using smokeless tobacco. He reports previous alcohol use. He reports that he does not use drugs.  Allergies  Allergen Reactions   Losartan Other (See Comments)    hyperkalemia    Family History  Problem Relation Age of Onset   Alzheimer's disease Maternal Grandfather    Cancer Mother        lymphoma   Alcohol abuse Father        smoker   Coronary artery disease Neg Hx  Stroke Neg Hx    Diabetes Neg Hx     Prior to Admission medications   Medication Sig Start Date End Date Taking? Authorizing Provider  aspirin 81 MG EC tablet Take 1 tablet (81 mg total) by mouth daily. 12/26/17   [name], MD  atorvastatin (LIPITOR) 80 MG tablet TAKE 1 TABLET (80 MG TOTAL) BY MOUTH DAILY. FOR CHOLESTEROL 05/11/18   Corky Crafts, MD  carvedilol (COREG) 3.125 MG tablet Take 1 tablet (3.125 mg total) by mouth 2 (two) times daily. 12/09/17   Corky Crafts, MD  Cyanocobalamin (B-12) 1000 MCG SUBL Place 1 tablet  under the tongue daily. 12/26/17   [name], MD  ferrous sulfate 325 (65 FE) MG tablet Take 1 tablet (325 mg total) by mouth daily with breakfast for 30 days. Patient not taking: Reported on 06/22/2018 03/30/18 05/13/18  Zigmund Daniel., MD  Lacosamide (VIMPAT) 100 MG TABS Take 1 tablet (100 mg total) by mouth 2 (two) times daily. 06/22/18   [name], MD  levETIRAcetam (KEPPRA) 750 MG tablet Take 2 tablets (1,500 mg total) by mouth 2 (two) times daily. 05/11/18   George Hugh, NP  Multiple Vitamin (MULTIVITAMIN WITH MINERALS) TABS tablet Take 1 tablet by mouth daily. 12/26/17   [name], MD  sertraline (ZOLOFT) 25 MG tablet Take 0.5 tablets (12.5 mg total) by mouth at bedtime. 05/11/18   [name], MD    Physical Exam: Vitals:   07/08/18 2334 07/08/18 2340 07/08/18 2345 07/09/18 0209  BP:   138/79 135/85  Pulse:  76 71   Resp:  16 13   Temp: 98.6 F (37 C)     SpO2:  100% 96%   Weight: 81.6 kg     Height: 5' (1.524 m)         Constitutional: Moderately built and nourished. Vitals:   07/08/18 2334 07/08/18 2340 07/08/18 2345 07/09/18 0209  BP:   138/79 135/85  Pulse:  76 71   Resp:  16 13   Temp: 98.6 F (37 C)     SpO2:  100% 96%   Weight: 81.6 kg     Height: 5' (1.524 m)      Eyes: Anicteric no pallor. ENMT: No discharge from the ears eyes nose or mouth. Neck: No mass felt.  No neck rigidity but no JVD appreciated. Respiratory: No rhonchi or crepitations. Cardiovascular: S1-S2 heard. Abdomen: Soft nontender bowel sounds present. Musculoskeletal: No edema.  No joint effusion. Skin: No rash. Neurologic: Alert awake oriented to time place and person.  Moves all extremities 5 x 5.  No facial asymmetry tongue is midline.  Pupils are equal and reacting to light. Psychiatric: Appears normal per normal affect.   Labs on Admission: I have personally reviewed following labs and imaging studies  CBC: Recent Labs  Lab 07/08/18 2253  07/08/18 2314  WBC  --  9.2  NEUTROABS  --  5.4  HGB 15.0 12.6*  HCT 44.0 38.8*  MCV  --  92.8  PLT  --  190   Basic Metabolic Panel: Recent Labs  Lab 07/08/18 2253 07/08/18 2314  NA 137 135  K 3.4* 3.9  CL 100 101  CO2  --  24  GLUCOSE 87 105*  BUN 4* 5*  CREATININE 0.70 0.79  CALCIUM  --  8.9   GFR: Estimated Creatinine Clearance: 83.7 mL/min (by C-G formula based on SCr of 0.79 mg/dL). Liver Function Tests: Recent Labs  Lab 07/08/18 2314  AST  23  ALT 18  ALKPHOS 53  BILITOT 0.9  PROT 6.3*  ALBUMIN 3.9   No results for input(s): LIPASE, AMYLASE in the last 168 hours. No results for input(s): AMMONIA in the last 168 hours. Coagulation Profile: Recent Labs  Lab 07/08/18 2314  INR 1.2   Cardiac Enzymes: No results for input(s): CKTOTAL, CKMB, CKMBINDEX, TROPONINI in the last 168 hours. BNP (last 3 results) No results for input(s): PROBNP in the last 8760 hours. HbA1C: No results for input(s): HGBA1C in the last 72 hours. CBG: Recent Labs  Lab 07/08/18 2248  GLUCAP 85   Lipid Profile: No results for input(s): CHOL, HDL, LDLCALC, TRIG, CHOLHDL, LDLDIRECT in the last 72 hours. Thyroid Function Tests: No results for input(s): TSH, T4TOTAL, FREET4, T3FREE, THYROIDAB in the last 72 hours. Anemia Panel: No results for input(s): VITAMINB12, FOLATE, FERRITIN, TIBC, IRON, RETICCTPCT in the last 72 hours. Urine analysis:    Component Value Date/Time   COLORURINE COLORLESS (A) 07/09/2018 0000   APPEARANCEUR CLEAR 07/09/2018 0000   LABSPEC 1.014 07/09/2018 0000   PHURINE 7.0 07/09/2018 0000   GLUCOSEU NEGATIVE 07/09/2018 0000   HGBUR NEGATIVE 07/09/2018 0000   BILIRUBINUR NEGATIVE 07/09/2018 0000   KETONESUR NEGATIVE 07/09/2018 0000   PROTEINUR NEGATIVE 07/09/2018 0000   UROBILINOGEN 0.2 04/26/2012 1900   NITRITE NEGATIVE 07/09/2018 0000   LEUKOCYTESUR NEGATIVE 07/09/2018 0000   Sepsis Labs: @LABRCNTIP (procalcitonin:4,lacticidven:4) )No results found  for this or any previous visit (from the past 240 hour(s)).   Radiological Exams on Admission: Ct Angio Head W Or Wo Contrast  Result Date: 07/08/2018 CLINICAL DATA:  Slurred speech and altered mental status EXAM: CT ANGIOGRAPHY HEAD AND NECK TECHNIQUE: Multidetector CT imaging of the head and neck was performed using the standard protocol during bolus administration of intravenous contrast. Multiplanar CT image reconstructions and MIPs were obtained to evaluate the vascular anatomy. Carotid stenosis measurements (when applicable) are obtained utilizing NASCET criteria, using the distal internal carotid diameter as the denominator. CONTRAST:  75mL OMNIPAQUE IOHEXOL 350 MG/ML SOLN COMPARISON:  Head CT 07/08/2018, 03/25/2018 FINDINGS: CTA NECK FINDINGS SKELETON: There is no bony spinal canal stenosis. No lytic or blastic lesion. OTHER NECK: Normal pharynx, larynx and major salivary glands. No cervical lymphadenopathy. Unremarkable thyroid gland. UPPER CHEST: No pneumothorax or pleural effusion. No nodules or masses. AORTIC ARCH: There is no calcific atherosclerosis of the aortic arch. There is no aneurysm, dissection or hemodynamically significant stenosis of the visualized ascending aorta and aortic arch. Normal variant aortic arch branching pattern with the left vertebral artery arising independently from the aortic arch. The visualized proximal subclavian arteries are widely patent. RIGHT CAROTID SYSTEM: --Common carotid artery: Widely patent origin without common carotid artery dissection or aneurysm. --Internal carotid artery: No dissection, occlusion or aneurysm. There is mixed density atherosclerosis extending into the proximal ICA, resulting in 60% stenosis. --External carotid artery: No acute abnormality. LEFT CAROTID SYSTEM: --Common carotid artery: Widely patent origin without common carotid artery dissection or aneurysm. --Internal carotid artery: No dissection, occlusion or aneurysm. There is mixed  density atherosclerosis extending into the proximal ICA, resulting in less than 50% stenosis. --External carotid artery: No acute abnormality. VERTEBRAL ARTERIES: Left dominant configuration. Both origins are clearly patent. No dissection, occlusion or flow-limiting stenosis to the skull base (V1-V3 segments). CTA HEAD FINDINGS POSTERIOR CIRCULATION: --Vertebral arteries: Normal V4 segments. --Posterior inferior cerebellar arteries (PICA): Patent origins from the vertebral arteries. --Anterior inferior cerebellar arteries (AICA): Patent origins from the basilar artery. --Basilar artery: Normal. --Superior  cerebellar arteries: Normal. --Posterior cerebral arteries (PCA): Normal. The right PCA is predominantly supplied by the posterior communicating artery. ANTERIOR CIRCULATION: --Intracranial internal carotid arteries: Atherosclerotic calcification of the internal carotid arteries at the skull base without hemodynamically significant stenosis. --Anterior cerebral arteries (ACA): Normal. Both A1 segments are present. Patent anterior communicating artery (a-comm). --Middle cerebral arteries (MCA): Normal. VENOUS SINUSES: As permitted by contrast timing, patent. ANATOMIC VARIANTS: Fetal right posterior cerebral artery. Review of the MIP images confirms the above findings. IMPRESSION: 1. No emergent large vessel occlusion. 2. 60% stenosis of the proximal right internal carotid artery due to mixed density atherosclerosis. 3. Left carotid bifurcation atherosclerosis with less than 50% stenosis of the proximal left internal carotid artery. Electronically Signed   By: Deatra Robinson M.D.   On: 07/08/2018 23:14   Ct Angio Neck W Or Wo Contrast  Result Date: 07/08/2018 CLINICAL DATA:  Slurred speech and altered mental status EXAM: CT ANGIOGRAPHY HEAD AND NECK TECHNIQUE: Multidetector CT imaging of the head and neck was performed using the standard protocol during bolus administration of intravenous contrast. Multiplanar CT  image reconstructions and MIPs were obtained to evaluate the vascular anatomy. Carotid stenosis measurements (when applicable) are obtained utilizing NASCET criteria, using the distal internal carotid diameter as the denominator. CONTRAST:  75mL OMNIPAQUE IOHEXOL 350 MG/ML SOLN COMPARISON:  Head CT 07/08/2018, 03/25/2018 FINDINGS: CTA NECK FINDINGS SKELETON: There is no bony spinal canal stenosis. No lytic or blastic lesion. OTHER NECK: Normal pharynx, larynx and major salivary glands. No cervical lymphadenopathy. Unremarkable thyroid gland. UPPER CHEST: No pneumothorax or pleural effusion. No nodules or masses. AORTIC ARCH: There is no calcific atherosclerosis of the aortic arch. There is no aneurysm, dissection or hemodynamically significant stenosis of the visualized ascending aorta and aortic arch. Normal variant aortic arch branching pattern with the left vertebral artery arising independently from the aortic arch. The visualized proximal subclavian arteries are widely patent. RIGHT CAROTID SYSTEM: --Common carotid artery: Widely patent origin without common carotid artery dissection or aneurysm. --Internal carotid artery: No dissection, occlusion or aneurysm. There is mixed density atherosclerosis extending into the proximal ICA, resulting in 60% stenosis. --External carotid artery: No acute abnormality. LEFT CAROTID SYSTEM: --Common carotid artery: Widely patent origin without common carotid artery dissection or aneurysm. --Internal carotid artery: No dissection, occlusion or aneurysm. There is mixed density atherosclerosis extending into the proximal ICA, resulting in less than 50% stenosis. --External carotid artery: No acute abnormality. VERTEBRAL ARTERIES: Left dominant configuration. Both origins are clearly patent. No dissection, occlusion or flow-limiting stenosis to the skull base (V1-V3 segments). CTA HEAD FINDINGS POSTERIOR CIRCULATION: --Vertebral arteries: Normal V4 segments. --Posterior inferior  cerebellar arteries (PICA): Patent origins from the vertebral arteries. --Anterior inferior cerebellar arteries (AICA): Patent origins from the basilar artery. --Basilar artery: Normal. --Superior cerebellar arteries: Normal. --Posterior cerebral arteries (PCA): Normal. The right PCA is predominantly supplied by the posterior communicating artery. ANTERIOR CIRCULATION: --Intracranial internal carotid arteries: Atherosclerotic calcification of the internal carotid arteries at the skull base without hemodynamically significant stenosis. --Anterior cerebral arteries (ACA): Normal. Both A1 segments are present. Patent anterior communicating artery (a-comm). --Middle cerebral arteries (MCA): Normal. VENOUS SINUSES: As permitted by contrast timing, patent. ANATOMIC VARIANTS: Fetal right posterior cerebral artery. Review of the MIP images confirms the above findings. IMPRESSION: 1. No emergent large vessel occlusion. 2. 60% stenosis of the proximal right internal carotid artery due to mixed density atherosclerosis. 3. Left carotid bifurcation atherosclerosis with less than 50% stenosis of the proximal left internal carotid  artery. Electronically Signed   By: Ulyses Jarred M.D.   On: 07/08/2018 23:14   Mr Brain Wo Contrast  Result Date: 07/09/2018 CLINICAL DATA:  Facial droop and slurred speech EXAM: MRI HEAD WITHOUT CONTRAST TECHNIQUE: Multiplanar, multiecho pulse sequences of the brain and surrounding structures were obtained without intravenous contrast. COMPARISON:  CTA head neck 07/08/2018 FINDINGS: BRAIN: Small focus of abnormal diffusion restriction within the right cerebellum. The midline structures are normal. Early confluent hyperintense T2-weighted signal of the periventricular and deep white matter, most commonly due to chronic ischemic microangiopathy. Advanced atrophy for age. There is ballooning of the temporal horn of the left lateral ventricle secondary to encephalomalacia from associated infarct.  Scattered chronic microhemmorhages in a peripheral predominant distribution. There is hemosiderin deposition at the site of the old posterior left MCA territory infarct. No midline shift or other mass effect. VASCULAR: The major intracranial arterial and venous sinus flow voids are normal. SKULL AND UPPER CERVICAL SPINE: Calvarial bone marrow signal is normal. There is no skull base mass. Visualized upper cervical spine and soft tissues are normal. SINUSES/ORBITS: No fluid levels or advanced mucosal thickening. No mastoid or middle ear effusion. The orbits are normal. IMPRESSION: 1. Punctate acute infarct within the right cerebellar hemisphere. No acute hemorrhage or mass effect. 2. Age advanced volume loss with posterior left MCA territory encephalomalacia. 3. Multiple scattered, peripheral predominant chronic microhemorrhages. Electronically Signed   By: Ulyses Jarred M.D.   On: 07/09/2018 00:57   Ct Head Code Stroke Wo Contrast  Result Date: 07/08/2018 CLINICAL DATA:  Code stroke. Slurred speech and altered mental status EXAM: CT HEAD WITHOUT CONTRAST TECHNIQUE: Contiguous axial images were obtained from the base of the skull through the vertex without intravenous contrast. COMPARISON:  03/25/2018 FINDINGS: Brain: There is no mass, hemorrhage or extra-axial collection. There is generalized brain atrophy greater than expected at this age. There is hypoattenuation of the periventricular white matter, most commonly indicating chronic ischemic microangiopathy. There is an old posterior left MCA territory infarct, unchanged. Vascular: No abnormal hyperdensity of the major intracranial arteries or dural venous sinuses. No intracranial atherosclerosis. Skull: The visualized skull base, calvarium and extracranial soft tissues are normal. Sinuses/Orbits: No fluid levels or advanced mucosal thickening of the visualized paranasal sinuses. No mastoid or middle ear effusion. The orbits are normal. ASPECTS [facility] Stroke  Program Early CT Score) - Ganglionic level infarction (caudate, lentiform nuclei, internal capsule, insula, M1-M3 cortex): 7 - Supraganglionic infarction (M4-M6 cortex): 3 Total score (0-10 with 10 being normal): 10 IMPRESSION: 1. No acute intracranial hemorrhage. 2. Old posterior left MCA territory infarct, chronic ischemic microangiopathy and age advanced parenchymal atrophy. 3. ASPECTS is 10. These results were communicated to Dr. Roland Rack at 11:00 pm on 07/08/2018 by text page via the [facility] messaging system. Electronically Signed   By: Ulyses Jarred M.D.   On: 07/08/2018 23:01    EKG: Independently reviewed.  Normal sinus rhythm.  Assessment/Plan Principal Problem:   Acute CVA (cerebrovascular accident) ([place]) Active Problems:   HLD (hyperlipidemia)   HTN (hypertension)   Seizure disorder (HCC)   S/P CABG x 3    1. Acute CVA -appreciate neurology consult.  Patient will be placed on neurochecks.  Patient passed swallow.  On antiplatelet agents Lipitor.  Get physical therapy consult.  Check hemoglobin A1c lipid panel. 2. History of seizures -continue Vimpat and Keppra. 3. CAD status post CABG on antiplatelet agents Lipitor Coreg. 4. Normocytic normochromic anemia on B12 supplements.  Follow CBC. 5.  Previous history of alcohol abuse has not had any alcohol for more than 4 years as per patient.   DVT prophylaxis: Lovenox. Code Status: Full code. Family Communication: Will need to discuss with family. Disposition Plan: To be determined. Consults called: Neurology. Admission status: Observation.   Eduard ClosArshad N Lenward Able MD [facility] Pager [phone].  If 7PM-7AM, please contact night-coverage [email or web address] Password [email or web address]  07/09/2018, 2:28 AM

## 2018-07-09 NOTE — Progress Notes (Addendum)
PROGRESS NOTE                                                                                                                                                                                                             Patient Demographics:    [name] Cruz, is a 63 y.o. male, DOB - Mar 14, 1955, ZOX:[id number]  Admit date - 07/08/2018   Admitting Physician Eduard Clos, MD  Outpatient Primary MD for the patient is [name], MD  LOS - 0  Outpatient Specialists: None  Chief Complaint  Patient presents with   Code Stroke       Brief Narrative   63 year old male with prior stroke, seizure disorder, CAD s/p CABG brought to the ED after being found by his sister to be confused with speech being slurred on the evening of admission.  Patient reported he was working around his house (in the crawl space) and upon returning into the house family noticed him to be different.  Patient reported that he has impaired vision in right eye  from prior stroke.  In the ED vitals were stable.  COVID-19 test was negative.  CT angiogram of the head and neck without large vessel obstruction (showed 60% occlusion of the proximal right internal carotid artery).  Neuro hospitalist consulted.  As patient seemed back to his baseline function TPA was not considered.  MRI of the brain showed right cerebellar stroke.  Admitted for further management.   Subjective:   Patient denies any weakness but does seem to have some speech impairment and some confusion.  (He has difficulty following some instructions and gets confused with date)   Assessment  & Plan :    Principal Problem:   Acute right cerebellar infarct Suspect due to small vessel disease.  Has old left MCA infarct.  Also has multiple scattered microhemorrhages and small vessel disease with atrophy.  CT angiogram of the head and neck shows 60% right ICA occlusion and 50% left ICA  occlusion.   Patient was on baby aspirin and placed on full dose aspirin now.  Stroke team recommends against Plavix given multiple microhemorrhages.  LDL of 69 and A1c of 5.6.  Continue home dose Lipitor. Allow permissive hypertension. No further needs per PT and OT. Pending 2D echo and EEG (rule out seizures for his new confusion which does not  seem to be associated with small right cerebellar infarct).   Active Problems:   HLD (hyperlipidemia) Continue statin.  Uncontrolled hypertension Allow permissive blood pressure for now  History of seizures Continue Vimpat and Keppra.  EEG pending.  CAD with history of CABG Continue beta-blocker and statin.  Now on full dose aspirin.  Patient status: Inpatient Patient has acute right cerebellar stroke with dysarthria and confusion that does not seem at baseline.  He is awaiting full stroke work-up and EEG to rule out possible seizures.  Will need to be monitored in an inpatient setting for >2 midnight.  Code Status : Full code  Family Communication  : None at bedside  Disposition Plan  : Home possibly in a.m. once work-up completed and no further symptoms  Barriers For Discharge : Active symptoms  Consults  : Neurology  Procedures  : CT head, CT angiogram head and neck, MRI brain, 2D echo and EEG  DVT Prophylaxis  :  Lovenox -   Lab Results  Component Value Date   PLT 200 07/09/2018    Antibiotics  :    Anti-infectives (From admission, onward)   None        Objective:   Vitals:   07/09/18 0631 07/09/18 0700 07/09/18 1036 07/09/18 1100  BP: 117/79 109/62 (!) 124/96 129/83  Pulse: (!) 56 76 77 60  Resp: Temp: 98.4 F (36.9 C) 97.9 F (36.6 C)  97.7 F (36.5 C)  TempSrc: Oral Axillary  Oral  SpO2: 99% 100% 98% 98%  Weight:      Height:        Wt Readings from Last 3 Encounters:  07/09/18 74.9 kg  03/26/18 83.9 kg  03/08/18 71.2 kg     Intake/Output Summary (Last 24 hours) at 07/09/2018  1509 Last data filed at 07/09/2018 0700 Gross per 24 hour  Intake 222 ml  Output --  Net 222 ml     Physical Exam  Gen: not in distress HEENT: No pallor, moist mucosa, supple neck Chest: clear b/l, no added sounds CVS: N S1&S2, no murmurs,  GI: soft, NT, ND, BS+ Musculoskeletal: warm, no edema CNS: Alert and oriented x2-3, dysarthria, normal muscle power tone and reflexes bilaterally    Data Review:    CBC Recent Labs  Lab 07/08/18 2253 07/08/18 2314 07/09/18 0416  WBC  --  9.2 7.0  HGB 15.0 12.6* 12.6*  HCT 44.0 38.8* 37.9*  PLT  --  190 200  MCV  --  92.8 90.2  MCH  --  30.1 30.0  MCHC  --  32.5 33.2  RDW  --  12.0 12.0  LYMPHSABS  --  2.3  --   MONOABS  --  1.3*  --   EOSABS  --  0.1  --   BASOSABS  --  0.0  --     Chemistries  Recent Labs  Lab 07/08/18 2253 07/08/18 2314 07/09/18 0416  NA 137 135 140  K 3.4* 3.9 3.7  CL 100 101 107  CO2  --  24 25  GLUCOSE 87 105* 103*  BUN 4* 5* <5*  CREATININE 0.70 0.79 0.71  CALCIUM  --  8.9 9.1  AST  --  23 20  ALT  --  18 18  ALKPHOS  --  53 64  BILITOT  --  0.9 0.6   ------------------------------------------------------------------------------------------------------------------ Recent Labs    07/09/18 0416  CHOL 120  HDL 35*  LDLCALC 69  TRIG  78  CHOLHDL 3.4    Lab Results  Component Value Date   HGBA1C 5.6 07/09/2018   ------------------------------------------------------------------------------------------------------------------ No results for input(s): TSH, T4TOTAL, T3FREE, THYROIDAB in the last 72 hours.  Invalid input(s): FREET3 ------------------------------------------------------------------------------------------------------------------ No results for input(s): VITAMINB12, FOLATE, FERRITIN, TIBC, IRON, RETICCTPCT in the last 72 hours.  Coagulation profile Recent Labs  Lab 07/08/18 2314  INR 1.2    No results for input(s): DDIMER in the last 72 hours.  Cardiac  Enzymes No results for input(s): CKMB, TROPONINI, MYOGLOBIN in the last 168 hours.  Invalid input(s): CK ------------------------------------------------------------------------------------------------------------------ No results found for: BNP  Inpatient Medications  Scheduled Meds:  aspirin  300 mg Rectal Daily   Or   aspirin  325 mg Oral Daily   atorvastatin  80 mg Oral q1800   carvedilol  3.125 mg Oral BID WC   enoxaparin (LOVENOX) injection  40 mg Subcutaneous Q24H   lacosamide  100 mg Oral BID   levETIRAcetam  1,500 mg Oral BID   multivitamin with minerals  1 tablet Oral Daily   sertraline  12.5 mg Oral QHS   vitamin B-12  1,000 mcg Oral Daily   Continuous Infusions:  sodium chloride 75 mL/hr at 07/09/18 0305   PRN Meds:.acetaminophen **OR** acetaminophen (TYLENOL) oral liquid 160 mg/5 mL **OR** acetaminophen  Micro Results Recent Results (from the past 240 hour(s))  SARS Coronavirus 2 (CEPHEID - [facility]formed [facility]), Hosp Order     Status: None   Collection Time: 07/09/18  2:15 AM   Specimen: Nasopharyngeal Swab  Result Value Ref Range Status   SARS Coronavirus 2 NEGATIVE NEGATIVE Final    Comment: (NOTE) If result is NEGATIVE SARS-CoV-2 target nucleic acids are NOT DETECTED. The SARS-CoV-2 RNA is generally detectable in upper and lower  respiratory specimens during the acute phase of infection. The lowest  concentration of SARS-CoV-2 viral copies this assay can detect is 250  copies / mL. A negative result does not preclude SARS-CoV-2 infection  and should not be used as the sole basis for treatment or other  patient management decisions.  A negative result may occur with  improper specimen collection / handling, submission of specimen other  than nasopharyngeal swab, presence of viral mutation(s) within the  areas targeted by this assay, and inadequate number of viral copies  (<250 copies / mL). A negative result must be combined  with clinical  observations, patient history, and epidemiological information. If result is POSITIVE SARS-CoV-2 target nucleic acids are DETECTED. The SARS-CoV-2 RNA is generally detectable in upper and lower  respiratory specimens dur ing the acute phase of infection.  Positive  results are indicative of active infection with SARS-CoV-2.  Clinical  correlation with patient history and other diagnostic information is  necessary to determine patient infection status.  Positive results do  not rule out bacterial infection or co-infection with other viruses. If result is PRESUMPTIVE POSTIVE SARS-CoV-2 nucleic acids MAY BE PRESENT.   A presumptive positive result was obtained on the submitted specimen  and confirmed on repeat testing.  While 2019 novel coronavirus  (SARS-CoV-2) nucleic acids may be present in the submitted sample  additional confirmatory testing may be necessary for epidemiological  and / or clinical management purposes  to differentiate between  SARS-CoV-2 and other Sarbecovirus currently known to infect humans.  If clinically indicated additional testing with an alternate test  methodology [phone]([id number]) is advised. The SARS-CoV-2 RNA is generally  detectable in upper and lower respiratory sp ecimens  during the acute  phase of infection. The expected result is Negative. Fact Sheet for Patients:  StrictlyIdeas.no Fact Sheet for Healthcare Providers: BankingDealers.co.za This test is not yet approved or cleared by the [place] FDA and has been authorized for detection and/or diagnosis of SARS-CoV-2 by FDA under an Emergency Use Authorization (EUA).  This EUA will remain in effect (meaning this test can be used) for the duration of the COVID-19 declaration under Section 564(b)(1) of the Act, 21 U.S.C. section 360bbb-3(b)(1), unless the authorization is terminated or revoked sooner. Performed at [facility], [place] [address] [place], [place] [address]     Radiology Reports Ct Angio Head W Or Wo Contrast  Result Date: 07/08/2018 CLINICAL DATA:  Slurred speech and altered mental status EXAM: CT ANGIOGRAPHY HEAD AND NECK TECHNIQUE: Multidetector CT imaging of the head and neck was performed using the standard protocol during bolus administration of intravenous contrast. Multiplanar CT image reconstructions and MIPs were obtained to evaluate the vascular anatomy. Carotid stenosis measurements (when applicable) are obtained utilizing NASCET criteria, using the distal internal carotid diameter as the denominator. CONTRAST:  82mL OMNIPAQUE IOHEXOL 350 MG/ML SOLN COMPARISON:  Head CT 07/08/2018, 03/25/2018 FINDINGS: CTA NECK FINDINGS SKELETON: There is no bony spinal canal stenosis. No lytic or blastic lesion. OTHER NECK: Normal pharynx, larynx and major salivary glands. No cervical lymphadenopathy. Unremarkable thyroid gland. UPPER CHEST: No pneumothorax or pleural effusion. No nodules or masses. AORTIC ARCH: There is no calcific atherosclerosis of the aortic arch. There is no aneurysm, dissection or hemodynamically significant stenosis of the visualized ascending aorta and aortic arch. Normal variant aortic arch branching pattern with the left vertebral artery arising independently from the aortic arch. The visualized proximal subclavian arteries are widely patent. RIGHT CAROTID SYSTEM: --Common carotid artery: Widely patent origin without common carotid artery dissection or aneurysm. --Internal carotid artery: No dissection, occlusion or aneurysm. There is mixed density atherosclerosis extending into the proximal ICA, resulting in 60% stenosis. --External carotid artery: No acute abnormality. LEFT CAROTID SYSTEM: --Common carotid artery: Widely patent origin without common carotid artery dissection or aneurysm. --Internal carotid artery: No dissection, occlusion or aneurysm. There is mixed density atherosclerosis extending into  the proximal ICA, resulting in less than 50% stenosis. --External carotid artery: No acute abnormality. VERTEBRAL ARTERIES: Left dominant configuration. Both origins are clearly patent. No dissection, occlusion or flow-limiting stenosis to the skull base (V1-V3 segments). CTA HEAD FINDINGS POSTERIOR CIRCULATION: --Vertebral arteries: Normal V4 segments. --Posterior inferior cerebellar arteries (PICA): Patent origins from the vertebral arteries. --Anterior inferior cerebellar arteries (AICA): Patent origins from the basilar artery. --Basilar artery: Normal. --Superior cerebellar arteries: Normal. --Posterior cerebral arteries (PCA): Normal. The right PCA is predominantly supplied by the posterior communicating artery. ANTERIOR CIRCULATION: --Intracranial internal carotid arteries: Atherosclerotic calcification of the internal carotid arteries at the skull base without hemodynamically significant stenosis. --Anterior cerebral arteries (ACA): Normal. Both A1 segments are present. Patent anterior communicating artery (a-comm). --Middle cerebral arteries (MCA): Normal. VENOUS SINUSES: As permitted by contrast timing, patent. ANATOMIC VARIANTS: Fetal right posterior cerebral artery. Review of the MIP images confirms the above findings. IMPRESSION: 1. No emergent large vessel occlusion. 2. 60% stenosis of the proximal right internal carotid artery due to mixed density atherosclerosis. 3. Left carotid bifurcation atherosclerosis with less than 50% stenosis of the proximal left internal carotid artery. Electronically Signed   By: Ulyses Jarred M.D.   On: 07/08/2018 23:14   Ct Angio Neck W Or Wo Contrast  Result Date:  07/08/2018 CLINICAL DATA:  Slurred speech and altered mental status EXAM: CT ANGIOGRAPHY HEAD AND NECK TECHNIQUE: Multidetector CT imaging of the head and neck was performed using the standard protocol during bolus administration of intravenous contrast. Multiplanar CT image reconstructions and MIPs were  obtained to evaluate the vascular anatomy. Carotid stenosis measurements (when applicable) are obtained utilizing NASCET criteria, using the distal internal carotid diameter as the denominator. CONTRAST:  75mL OMNIPAQUE IOHEXOL 350 MG/ML SOLN COMPARISON:  Head CT 07/08/2018, 03/25/2018 FINDINGS: CTA NECK FINDINGS SKELETON: There is no bony spinal canal stenosis. No lytic or blastic lesion. OTHER NECK: Normal pharynx, larynx and major salivary glands. No cervical lymphadenopathy. Unremarkable thyroid gland. UPPER CHEST: No pneumothorax or pleural effusion. No nodules or masses. AORTIC ARCH: There is no calcific atherosclerosis of the aortic arch. There is no aneurysm, dissection or hemodynamically significant stenosis of the visualized ascending aorta and aortic arch. Normal variant aortic arch branching pattern with the left vertebral artery arising independently from the aortic arch. The visualized proximal subclavian arteries are widely patent. RIGHT CAROTID SYSTEM: --Common carotid artery: Widely patent origin without common carotid artery dissection or aneurysm. --Internal carotid artery: No dissection, occlusion or aneurysm. There is mixed density atherosclerosis extending into the proximal ICA, resulting in 60% stenosis. --External carotid artery: No acute abnormality. LEFT CAROTID SYSTEM: --Common carotid artery: Widely patent origin without common carotid artery dissection or aneurysm. --Internal carotid artery: No dissection, occlusion or aneurysm. There is mixed density atherosclerosis extending into the proximal ICA, resulting in less than 50% stenosis. --External carotid artery: No acute abnormality. VERTEBRAL ARTERIES: Left dominant configuration. Both origins are clearly patent. No dissection, occlusion or flow-limiting stenosis to the skull base (V1-V3 segments). CTA HEAD FINDINGS POSTERIOR CIRCULATION: --Vertebral arteries: Normal V4 segments. --Posterior inferior cerebellar arteries (PICA): Patent  origins from the vertebral arteries. --Anterior inferior cerebellar arteries (AICA): Patent origins from the basilar artery. --Basilar artery: Normal. --Superior cerebellar arteries: Normal. --Posterior cerebral arteries (PCA): Normal. The right PCA is predominantly supplied by the posterior communicating artery. ANTERIOR CIRCULATION: --Intracranial internal carotid arteries: Atherosclerotic calcification of the internal carotid arteries at the skull base without hemodynamically significant stenosis. --Anterior cerebral arteries (ACA): Normal. Both A1 segments are present. Patent anterior communicating artery (a-comm). --Middle cerebral arteries (MCA): Normal. VENOUS SINUSES: As permitted by contrast timing, patent. ANATOMIC VARIANTS: Fetal right posterior cerebral artery. Review of the MIP images confirms the above findings. IMPRESSION: 1. No emergent large vessel occlusion. 2. 60% stenosis of the proximal right internal carotid artery due to mixed density atherosclerosis. 3. Left carotid bifurcation atherosclerosis with less than 50% stenosis of the proximal left internal carotid artery. Electronically Signed   By: Deatra RobinsonKevin  Herman M.D.   On: 07/08/2018 23:14   Mr Brain Wo Contrast  Result Date: 07/09/2018 CLINICAL DATA:  Facial droop and slurred speech EXAM: MRI HEAD WITHOUT CONTRAST TECHNIQUE: Multiplanar, multiecho pulse sequences of the brain and surrounding structures were obtained without intravenous contrast. COMPARISON:  CTA head neck 07/08/2018 FINDINGS: BRAIN: Small focus of abnormal diffusion restriction within the right cerebellum. The midline structures are normal. Early confluent hyperintense T2-weighted signal of the periventricular and deep white matter, most commonly due to chronic ischemic microangiopathy. Advanced atrophy for age. There is ballooning of the temporal horn of the left lateral ventricle secondary to encephalomalacia from associated infarct. Scattered chronic microhemmorhages in a  peripheral predominant distribution. There is hemosiderin deposition at the site of the old posterior left MCA territory infarct. No midline shift or other mass effect.  VASCULAR: The major intracranial arterial and venous sinus flow voids are normal. SKULL AND UPPER CERVICAL SPINE: Calvarial bone marrow signal is normal. There is no skull base mass. Visualized upper cervical spine and soft tissues are normal. SINUSES/ORBITS: No fluid levels or advanced mucosal thickening. No mastoid or middle ear effusion. The orbits are normal. IMPRESSION: 1. Punctate acute infarct within the right cerebellar hemisphere. No acute hemorrhage or mass effect. 2. Age advanced volume loss with posterior left MCA territory encephalomalacia. 3. Multiple scattered, peripheral predominant chronic microhemorrhages. Electronically Signed   By: Deatra RobinsonKevin  Herman M.D.   On: 07/09/2018 00:57   Ct Head Code Stroke Wo Contrast  Result Date: 07/08/2018 CLINICAL DATA:  Code stroke. Slurred speech and altered mental status EXAM: CT HEAD WITHOUT CONTRAST TECHNIQUE: Contiguous axial images were obtained from the base of the skull through the vertex without intravenous contrast. COMPARISON:  03/25/2018 FINDINGS: Brain: There is no mass, hemorrhage or extra-axial collection. There is generalized brain atrophy greater than expected at this age. There is hypoattenuation of the periventricular white matter, most commonly indicating chronic ischemic microangiopathy. There is an old posterior left MCA territory infarct, unchanged. Vascular: No abnormal hyperdensity of the major intracranial arteries or dural venous sinuses. No intracranial atherosclerosis. Skull: The visualized skull base, calvarium and extracranial soft tissues are normal. Sinuses/Orbits: No fluid levels or advanced mucosal thickening of the visualized paranasal sinuses. No mastoid or middle ear effusion. The orbits are normal. ASPECTS [facility]([place] Stroke Program Early CT Score) - Ganglionic  level infarction (caudate, lentiform nuclei, internal capsule, insula, M1-M3 cortex): 7 - Supraganglionic infarction (M4-M6 cortex): 3 Total score (0-10 with 10 being normal): 10 IMPRESSION: 1. No acute intracranial hemorrhage. 2. Old posterior left MCA territory infarct, chronic ischemic microangiopathy and age advanced parenchymal atrophy. 3. ASPECTS is 10. These results were communicated to Dr. Ritta SlotMcNeill Kirkpatrick at 11:00 pm on 07/08/2018 by text page via the [facility] messaging system. Electronically Signed   By: Deatra RobinsonKevin  Herman M.D.   On: 07/08/2018 23:01    Time Spent in minutes  25   Capone Schwinn M.D on 07/09/2018 at 3:09 PM  Between 7am to 7pm - Pager - [phone]  After 7pm go to [email or web address] - password [email or web address]  [facility] -  Office  [phone]

## 2018-07-09 NOTE — ED Provider Notes (Signed)
[facility] EMERGENCY DEPARTMENT Provider Note   CSN: [id number] Arrival date & time: 07/08/18  2247    History   Chief Complaint Chief Complaint  Patient presents with   Code Stroke    HPI [name] Cruz is a 63 y.o. male with a h/o seizures, CAD s/p CABG, intracranial bleed, history of stroke with resultant aphasia, PAF not on AC due to hemorrhage, and history of upper extremity DVT who presents to the emergency department by EMS with a chief complaint of slurred speech.  EMS reports that the patient's mother called out after she spoke with the patient on the phone and his speech sounded slurred at approximately 21:45, and he sounded slightly confused and was "not acting right."  EMS reports that family initially stated that the patient had mild right-sided facial droop. No seizure-like activity noted per family.   The patient reports that he is feeling at his baseline. He reports that he was working under the house around the septic tank for some time.  He spoke with the family on the phone after he came out from under the house.  He has no complaints at this time.  He reports that he has not taken his nighttime dose of his antiseizure medications.  He denies headache, visual changes, new numbness or weakness, N/V/D/, shortness of breath, chest pain, neck pain or stiffness, or slurred speech.       The history is provided by the patient, a relative and medical records. No language interpreter was used.    Past Medical History:  Diagnosis Date   Acute deep vein thrombosis (DVT) of left upper extremity (HCC)    L brachial and basilic veins, eliquis started 08/22/2017 to continue for 3 months total    Cognitive impairment 2007   after stroke, saw rehab but told to stop because was too upsetting to him   History of chicken pox    HLD (hyperlipidemia)    HTN (hypertension)    NSTEMI (non-ST elevated myocardial infarction) (HCC) 02/21/2017   Obesity     Stroke, hemorrhagic (HCC) 2007   thought 2/2 HTN (240sbp); residual cognitive impairment, loss of R peripheral field, no driving    Patient Active Problem List   Diagnosis Date Noted   Acute CVA (cerebrovascular accident) (HCC) 07/09/2018   Status epilepticus (HCC) 03/25/2018   Hyperkalemia 10/03/2017   Unilateral occipital headache 10/03/2017   Ischemic stroke (HCC) 08/23/2017   Coronary artery disease involving coronary bypass graft of native heart without angina pectoris    Atrial fibrillation (HCC)    Dysphagia, post-stroke    Cardiac arrest (HCC) 08/09/2017   S/P CABG x 3 02/24/2017   NSTEMI (non-ST elevated myocardial infarction) (HCC) 02/21/2017   Encephalomalacia 10/28/2012   Seizure disorder (HCC) 04/26/2012   Alcohol abuse 04/26/2012   History of stroke with current residual effects    Cognitive impairment    HLD (hyperlipidemia)    HTN (hypertension)     Past Surgical History:  Procedure Laterality Date   ANKLE SURGERY  1990s   right foot with plate and screws   CORONARY ARTERY BYPASS GRAFT N/A 02/24/2017   3v Procedure: CORONARY ARTERY BYPASS GRAFTING (CABG) x 3 ON PUMP USING LEFT INTERNAL MAMMARY ARTERY TO LEFT ANTERIOR DESENDING CORNARY ARTERY, RIGHT GREATER SAPHENOUS VEIN TO LEFT CIRCUMFLEX ARTERY AND POSTERIOR DESENDING ARTERY. RIGHT GREATER SAPHENOUS VEIN OBTAINED VIA ENDOVEIN HARVEST.;  Surgeon: Delight Ovens, MD   IABP INSERTION N/A 02/21/2017   Procedure: IABP Insertion;  Surgeon: Yvonne KendallEnd, Christopher, MD;  Location: [facility] INVASIVE CV LAB;  Service: Cardiovascular;  Laterality: N/A;   LEFT HEART CATH AND CORONARY ANGIOGRAPHY N/A 02/21/2017   Procedure: LEFT HEART CATH AND CORONARY ANGIOGRAPHY;  Surgeon: Yvonne KendallEnd, Christopher, MD;  Location: [facility] INVASIVE CV LAB;  Service: Cardiovascular;  Laterality: N/A;   TEE WITHOUT CARDIOVERSION N/A 02/24/2017   Procedure: TRANSESOPHAGEAL ECHOCARDIOGRAM (TEE);  Surgeon: Delight OvensGerhardt, Edward B, MD;  [facility];   Service: Open Heart Surgery;  Laterality: N/A;        Home Medications    Prior to Admission medications   Medication Sig Start Date End Date Taking? Authorizing Provider  aspirin 81 MG EC tablet Take 1 tablet (81 mg total) by mouth daily. 12/26/17   [name], [name], MD  atorvastatin (LIPITOR) 80 MG tablet TAKE 1 TABLET (80 MG TOTAL) BY MOUTH DAILY. FOR CHOLESTEROL 05/11/18   Corky CraftsVaranasi, Jayadeep S, MD  carvedilol (COREG) 3.125 MG tablet Take 1 tablet (3.125 mg total) by mouth 2 (two) times daily. 12/09/17   Corky CraftsVaranasi, Jayadeep S, MD  Cyanocobalamin (B-12) 1000 MCG SUBL Place 1 tablet under the tongue daily. 12/26/17   [name], [name], MD  ferrous sulfate 325 (65 FE) MG tablet Take 1 tablet (325 mg total) by mouth daily with breakfast for 30 days. Patient not taking: Reported on 06/22/2018 03/30/18 05/13/18  Zigmund DanielPowell, A Caldwell Jr., MD  Lacosamide (VIMPAT) 100 MG TABS Take 1 tablet (100 mg total) by mouth 2 (two) times daily. 06/22/18   [name], [name], MD  levETIRAcetam (KEPPRA) 750 MG tablet Take 2 tablets (1,500 mg total) by mouth 2 (two) times daily. 05/11/18   George HughVanschaick, Jessica, NP  Multiple Vitamin (MULTIVITAMIN WITH MINERALS) TABS tablet Take 1 tablet by mouth daily. 12/26/17   [name], [name], MD  sertraline (ZOLOFT) 25 MG tablet Take 0.5 tablets (12.5 mg total) by mouth at bedtime. 05/11/18   [name], [name], MD    Family History Family History  Problem Relation Age of Onset   Alzheimer's disease Maternal Grandfather    Cancer Mother        lymphoma   Alcohol abuse Father        smoker   Coronary artery disease Neg Hx    Stroke Neg Hx    Diabetes Neg Hx     Social History Social History   Tobacco Use   Smoking status: Never Smoker   Smokeless tobacco: Former NeurosurgeonUser  Substance Use Topics   Alcohol use: Not Currently   Drug use: No     Allergies   Losartan   Review of Systems Review of Systems  Constitutional: Negative for appetite change, chills and  fever.  HENT: Negative for congestion and sore throat.   Eyes: Negative for visual disturbance.  Respiratory: Negative for cough, shortness of breath and wheezing.   Cardiovascular: Negative for chest pain, palpitations and leg swelling.  Gastrointestinal: Negative for abdominal pain, blood in stool, diarrhea, nausea and vomiting.  Genitourinary: Negative for dysuria.  Musculoskeletal: Negative for back pain.  Skin: Negative for rash.  Allergic/Immunologic: Negative for immunocompromised state.  Neurological: Positive for speech difficulty. Negative for dizziness, seizures, syncope, weakness, light-headedness and headaches.  Psychiatric/Behavioral: Positive for confusion.     Physical Exam Updated Vital Signs BP 126/73 (BP Location: Left Arm)    Pulse (!) 53    Temp 97.7 F (36.5 C) (Oral)    Resp 14    Ht 5' (1.524 m)    Wt 74.9 kg    SpO2 100%  BMI 32.25 kg/m   Physical Exam Vitals signs and nursing note reviewed.  Constitutional:      General: He is not in acute distress.    Appearance: He is well-developed. He is not ill-appearing, toxic-appearing or diaphoretic.     Comments: Pleasant, well appearing. NAD.   HENT:     Head: Normocephalic.  Eyes:     Conjunctiva/sclera: Conjunctivae normal.  Neck:     Musculoskeletal: Neck supple.  Cardiovascular:     Rate and Rhythm: Normal rate and regular rhythm.     Pulses: Normal pulses.     Heart sounds: Normal heart sounds. No murmur. No friction rub. No gallop.   Pulmonary:     Effort: Pulmonary effort is normal. No respiratory distress.     Breath sounds: No stridor. No wheezing, rhonchi or rales.  Chest:     Chest wall: No tenderness.  Abdominal:     General: There is no distension.     Palpations: Abdomen is soft. There is no mass.     Tenderness: There is no abdominal tenderness. There is no right CVA tenderness, left CVA tenderness, guarding or rebound.     Hernia: No hernia is present.  Skin:    General: Skin is  warm and dry.  Neurological:     Mental Status: He is alert.     Comments: Speech is clear and goal oriented with occasional aphasia (difficulty wording), follows commands Major Cranial nerves without deficit, no facial droop Normal strength in upper and lower extremities bilaterally including dorsiflexion and plantar flexion, strong and equal grip strength Sensation normal to light and sharp touch Moves extremities without ataxia, coordination intact Normal finger to nose and rapid alternating movements No pronator drift Oriented to self. When asked about time patient states "I always have difficulty with the year. I can get it, but I have to use a trick and it takes me a minute or two."   Psychiatric:        Behavior: Behavior normal.      ED Treatments / Results  Labs (all labs ordered are listed, but only abnormal results are displayed) Labs Reviewed  CBC - Abnormal; Notable for the following components:      Result Value   RBC 4.18 (*)    Hemoglobin 12.6 (*)    HCT 38.8 (*)    All other components within normal limits  DIFFERENTIAL - Abnormal; Notable for the following components:   Monocytes Absolute 1.3 (*)    All other components within normal limits  COMPREHENSIVE METABOLIC PANEL - Abnormal; Notable for the following components:   Glucose, Bld 105 (*)    BUN 5 (*)    Total Protein 6.3 (*)    All other components within normal limits  URINALYSIS, ROUTINE W REFLEX MICROSCOPIC - Abnormal; Notable for the following components:   Color, Urine COLORLESS (*)    All other components within normal limits  CBC - Abnormal; Notable for the following components:   RBC 4.20 (*)    Hemoglobin 12.6 (*)    HCT 37.9 (*)    All other components within normal limits  I-STAT CHEM 8, ED - Abnormal; Notable for the following components:   Potassium 3.4 (*)    BUN 4 (*)    Calcium, Ion 1.09 (*)    All other components within normal limits  SARS CORONAVIRUS 2 (HOSPITAL ORDER,  PERFORMED IN [facility])  ETHANOL  PROTIME-INR  APTT  RAPID URINE DRUG SCREEN,  HOSP PERFORMED  HEMOGLOBIN A1C  COMPREHENSIVE METABOLIC PANEL  LIPID PANEL  CBG MONITORING, ED    EKG None  Radiology Ct Angio Head W Or Wo Contrast  Result Date: 07/08/2018 CLINICAL DATA:  Slurred speech and altered mental status EXAM: CT ANGIOGRAPHY HEAD AND NECK TECHNIQUE: Multidetector CT imaging of the head and neck was performed using the standard protocol during bolus administration of intravenous contrast. Multiplanar CT image reconstructions and MIPs were obtained to evaluate the vascular anatomy. Carotid stenosis measurements (when applicable) are obtained utilizing NASCET criteria, using the distal internal carotid diameter as the denominator. CONTRAST:  75mL OMNIPAQUE IOHEXOL 350 MG/ML SOLN COMPARISON:  Head CT 07/08/2018, 03/25/2018 FINDINGS: CTA NECK FINDINGS SKELETON: There is no bony spinal canal stenosis. No lytic or blastic lesion. OTHER NECK: Normal pharynx, larynx and major salivary glands. No cervical lymphadenopathy. Unremarkable thyroid gland. UPPER CHEST: No pneumothorax or pleural effusion. No nodules or masses. AORTIC ARCH: There is no calcific atherosclerosis of the aortic arch. There is no aneurysm, dissection or hemodynamically significant stenosis of the visualized ascending aorta and aortic arch. Normal variant aortic arch branching pattern with the left vertebral artery arising independently from the aortic arch. The visualized proximal subclavian arteries are widely patent. RIGHT CAROTID SYSTEM: --Common carotid artery: Widely patent origin without common carotid artery dissection or aneurysm. --Internal carotid artery: No dissection, occlusion or aneurysm. There is mixed density atherosclerosis extending into the proximal ICA, resulting in 60% stenosis. --External carotid artery: No acute abnormality. LEFT CAROTID SYSTEM: --Common carotid artery: Widely patent origin without  common carotid artery dissection or aneurysm. --Internal carotid artery: No dissection, occlusion or aneurysm. There is mixed density atherosclerosis extending into the proximal ICA, resulting in less than 50% stenosis. --External carotid artery: No acute abnormality. VERTEBRAL ARTERIES: Left dominant configuration. Both origins are clearly patent. No dissection, occlusion or flow-limiting stenosis to the skull base (V1-V3 segments). CTA HEAD FINDINGS POSTERIOR CIRCULATION: --Vertebral arteries: Normal V4 segments. --Posterior inferior cerebellar arteries (PICA): Patent origins from the vertebral arteries. --Anterior inferior cerebellar arteries (AICA): Patent origins from the basilar artery. --Basilar artery: Normal. --Superior cerebellar arteries: Normal. --Posterior cerebral arteries (PCA): Normal. The right PCA is predominantly supplied by the posterior communicating artery. ANTERIOR CIRCULATION: --Intracranial internal carotid arteries: Atherosclerotic calcification of the internal carotid arteries at the skull base without hemodynamically significant stenosis. --Anterior cerebral arteries (ACA): Normal. Both A1 segments are present. Patent anterior communicating artery (a-comm). --Middle cerebral arteries (MCA): Normal. VENOUS SINUSES: As permitted by contrast timing, patent. ANATOMIC VARIANTS: Fetal right posterior cerebral artery. Review of the MIP images confirms the above findings. IMPRESSION: 1. No emergent large vessel occlusion. 2. 60% stenosis of the proximal right internal carotid artery due to mixed density atherosclerosis. 3. Left carotid bifurcation atherosclerosis with less than 50% stenosis of the proximal left internal carotid artery. Electronically Signed   By: Deatra Robinson M.D.   On: 07/08/2018 23:14   Ct Angio Neck W Or Wo Contrast  Result Date: 07/08/2018 CLINICAL DATA:  Slurred speech and altered mental status EXAM: CT ANGIOGRAPHY HEAD AND NECK TECHNIQUE: Multidetector CT imaging of  the head and neck was performed using the standard protocol during bolus administration of intravenous contrast. Multiplanar CT image reconstructions and MIPs were obtained to evaluate the vascular anatomy. Carotid stenosis measurements (when applicable) are obtained utilizing NASCET criteria, using the distal internal carotid diameter as the denominator. CONTRAST:  75mL OMNIPAQUE IOHEXOL 350 MG/ML SOLN COMPARISON:  Head CT 07/08/2018, 03/25/2018 FINDINGS: CTA NECK FINDINGS SKELETON: There  is no bony spinal canal stenosis. No lytic or blastic lesion. OTHER NECK: Normal pharynx, larynx and major salivary glands. No cervical lymphadenopathy. Unremarkable thyroid gland. UPPER CHEST: No pneumothorax or pleural effusion. No nodules or masses. AORTIC ARCH: There is no calcific atherosclerosis of the aortic arch. There is no aneurysm, dissection or hemodynamically significant stenosis of the visualized ascending aorta and aortic arch. Normal variant aortic arch branching pattern with the left vertebral artery arising independently from the aortic arch. The visualized proximal subclavian arteries are widely patent. RIGHT CAROTID SYSTEM: --Common carotid artery: Widely patent origin without common carotid artery dissection or aneurysm. --Internal carotid artery: No dissection, occlusion or aneurysm. There is mixed density atherosclerosis extending into the proximal ICA, resulting in 60% stenosis. --External carotid artery: No acute abnormality. LEFT CAROTID SYSTEM: --Common carotid artery: Widely patent origin without common carotid artery dissection or aneurysm. --Internal carotid artery: No dissection, occlusion or aneurysm. There is mixed density atherosclerosis extending into the proximal ICA, resulting in less than 50% stenosis. --External carotid artery: No acute abnormality. VERTEBRAL ARTERIES: Left dominant configuration. Both origins are clearly patent. No dissection, occlusion or flow-limiting stenosis to the skull  base (V1-V3 segments). CTA HEAD FINDINGS POSTERIOR CIRCULATION: --Vertebral arteries: Normal V4 segments. --Posterior inferior cerebellar arteries (PICA): Patent origins from the vertebral arteries. --Anterior inferior cerebellar arteries (AICA): Patent origins from the basilar artery. --Basilar artery: Normal. --Superior cerebellar arteries: Normal. --Posterior cerebral arteries (PCA): Normal. The right PCA is predominantly supplied by the posterior communicating artery. ANTERIOR CIRCULATION: --Intracranial internal carotid arteries: Atherosclerotic calcification of the internal carotid arteries at the skull base without hemodynamically significant stenosis. --Anterior cerebral arteries (ACA): Normal. Both A1 segments are present. Patent anterior communicating artery (a-comm). --Middle cerebral arteries (MCA): Normal. VENOUS SINUSES: As permitted by contrast timing, patent. ANATOMIC VARIANTS: Fetal right posterior cerebral artery. Review of the MIP images confirms the above findings. IMPRESSION: 1. No emergent large vessel occlusion. 2. 60% stenosis of the proximal right internal carotid artery due to mixed density atherosclerosis. 3. Left carotid bifurcation atherosclerosis with less than 50% stenosis of the proximal left internal carotid artery. Electronically Signed   By: Deatra RobinsonKevin  Herman M.D.   On: 07/08/2018 23:14   Mr Brain Wo Contrast  Result Date: 07/09/2018 CLINICAL DATA:  Facial droop and slurred speech EXAM: MRI HEAD WITHOUT CONTRAST TECHNIQUE: Multiplanar, multiecho pulse sequences of the brain and surrounding structures were obtained without intravenous contrast. COMPARISON:  CTA head neck 07/08/2018 FINDINGS: BRAIN: Small focus of abnormal diffusion restriction within the right cerebellum. The midline structures are normal. Early confluent hyperintense T2-weighted signal of the periventricular and deep white matter, most commonly due to chronic ischemic microangiopathy. Advanced atrophy for age.  There is ballooning of the temporal horn of the left lateral ventricle secondary to encephalomalacia from associated infarct. Scattered chronic microhemmorhages in a peripheral predominant distribution. There is hemosiderin deposition at the site of the old posterior left MCA territory infarct. No midline shift or other mass effect. VASCULAR: The major intracranial arterial and venous sinus flow voids are normal. SKULL AND UPPER CERVICAL SPINE: Calvarial bone marrow signal is normal. There is no skull base mass. Visualized upper cervical spine and soft tissues are normal. SINUSES/ORBITS: No fluid levels or advanced mucosal thickening. No mastoid or middle ear effusion. The orbits are normal. IMPRESSION: 1. Punctate acute infarct within the right cerebellar hemisphere. No acute hemorrhage or mass effect. 2. Age advanced volume loss with posterior left MCA territory encephalomalacia. 3. Multiple scattered, peripheral predominant chronic microhemorrhages.  Electronically Signed   By: Ulyses Jarred M.D.   On: 07/09/2018 00:57   Ct Head Code Stroke Wo Contrast  Result Date: 07/08/2018 CLINICAL DATA:  Code stroke. Slurred speech and altered mental status EXAM: CT HEAD WITHOUT CONTRAST TECHNIQUE: Contiguous axial images were obtained from the base of the skull through the vertex without intravenous contrast. COMPARISON:  03/25/2018 FINDINGS: Brain: There is no mass, hemorrhage or extra-axial collection. There is generalized brain atrophy greater than expected at this age. There is hypoattenuation of the periventricular white matter, most commonly indicating chronic ischemic microangiopathy. There is an old posterior left MCA territory infarct, unchanged. Vascular: No abnormal hyperdensity of the major intracranial arteries or dural venous sinuses. No intracranial atherosclerosis. Skull: The visualized skull base, calvarium and extracranial soft tissues are normal. Sinuses/Orbits: No fluid levels or advanced mucosal  thickening of the visualized paranasal sinuses. No mastoid or middle ear effusion. The orbits are normal. ASPECTS [facility] Stroke Program Early CT Score) - Ganglionic level infarction (caudate, lentiform nuclei, internal capsule, insula, M1-M3 cortex): 7 - Supraganglionic infarction (M4-M6 cortex): 3 Total score (0-10 with 10 being normal): 10 IMPRESSION: 1. No acute intracranial hemorrhage. 2. Old posterior left MCA territory infarct, chronic ischemic microangiopathy and age advanced parenchymal atrophy. 3. ASPECTS is 10. These results were communicated to Dr. Roland Rack at 11:00 pm on 07/08/2018 by text page via the [employer] messaging system. Electronically Signed   By: Ulyses Jarred M.D.   On: 07/08/2018 23:01    Procedures Procedures (including critical care time)  Medications Ordered in ED Medications  atorvastatin (LIPITOR) tablet 80 mg (has no administration in time range)  carvedilol (COREG) tablet 3.125 mg (has no administration in time range)  sertraline (ZOLOFT) tablet 12.5 mg (has no administration in time range)  vitamin B-12 (CYANOCOBALAMIN) tablet 1,000 mcg (has no administration in time range)  lacosamide (VIMPAT) tablet 100 mg (has no administration in time range)  levETIRAcetam (KEPPRA) tablet 1,500 mg (has no administration in time range)  multivitamin with minerals tablet 1 tablet (has no administration in time range)  0.9 %  sodium chloride infusion ( Intravenous New Bag/Given 07/09/18 0305)  acetaminophen (TYLENOL) tablet 650 mg (has no administration in time range)    Or  acetaminophen (TYLENOL) solution 650 mg (has no administration in time range)    Or  acetaminophen (TYLENOL) suppository 650 mg (has no administration in time range)  enoxaparin (LOVENOX) injection 40 mg (has no administration in time range)  aspirin suppository 300 mg (has no administration in time range)    Or  aspirin tablet 325 mg (has no administration in time range)  iohexol (OMNIPAQUE) 350  MG/ML injection 75 mL (75 mLs Intravenous Contrast Given 07/08/18 2305)  lacosamide (VIMPAT) tablet 100 mg (100 mg Oral Given 07/09/18 0141)  levETIRAcetam (KEPPRA) tablet 750 mg (750 mg Oral Given 07/09/18 0141)   stroke: mapping our early stages of recovery book (1 each Does not apply Given 07/09/18 0304)     Initial Impression / Assessment and Plan / ED Course  I have reviewed the triage vital signs and the nursing notes.  Pertinent labs & imaging results that were available during my care of the patient were reviewed by me and considered in my medical decision making (see chart for details).        63 year old male with a h/o seizures, CAD s/p CABG, intracranial bleed, history of stroke with resultant aphasia, PAF not on AC due to hemorrhage, and history of upper extremity DVT  presenting by EMS. Patient was initially brought in as a Code Stroke. The patient was seen and evaluated by Dr. Amada Jupiter with neurology who reviewed CT head/CTA, which were negative for acute findings and without significant stenosis. Neurology recommends MRI brain, and if negative, no further work up is indicated at this time, and he should continue his home anti-epileptics. If MRI reveals a new ischemic stroke, he will need admission and stroke work up. The patient was also seen and evaluated along with Dr. Blinda Leatherwood, attending physician.   Labs are grossly reassuring. MRI with acute punctate infarct within the right cerebellar hemisphere. Home dose of Keppra and Vimpat given. Patient is not a candidate for TPA and is already on home ASA. Spoke with Dr. Amada Jupiter regarding MRI findings who will follow the patient as his recommendation for admission has not changed. Consult appreciated. Consulted the hospitalist team. COVID-19 test is pending. Dr. Toniann Fail will accept the patient for admission. The patient appears reasonably stabilized for admission considering the current resources, flow, and capabilities available in  the ED at this time, and I doubt any other [employer] requiring further screening and/or treatment in the ED prior to admission.  Final Clinical Impressions(s) / ED Diagnoses   Final diagnoses:  Acute CVA (cerebrovascular accident) [facility])    ED Discharge Orders    None       Barkley Boards, PA-C 07/09/18 0546    Gilda Crease, MD 07/09/18 [id number]

## 2018-07-09 NOTE — ED Notes (Signed)
Equal grips

## 2018-07-09 NOTE — Progress Notes (Signed)
SLP Cancellation Note  Patient Details Name: JATNIEL VERASTEGUI MRN: [id number] DOB: 03-13-1955   Cancelled treatment:       Reason Eval/Treat Not Completed: Patient at procedure or test/unavailable(Pt having echo completed at this time. SLP will follow up. )  [place] Sigmund I. Hardin Negus, [place], [place] Office number [phone] Pager [place] 07/09/2018, 9:16 AM

## 2018-07-09 NOTE — Evaluation (Signed)
Physical Therapy Evaluation Patient Details Name: [name] Cruz MRN: [id number] DOB: 06-Jul-1955 Today's Date: 07/09/2018   History of Present Illness  Patient had an acute onset of altered mental status and speech difficultys last night. By the time he got to the hospital he was alert and oriented. He continues to have speech difficulty;s. He also reports a right visual field deficit but her reports this is baseline. PMH: Stroke, HTN, Cognative impairment, acute DVT   Clinical Impression  Patient appears to be at baseline mobility. He has no loss of balance with any mobility. He had no syncope. He has a right visual field deficit but this is baseline. He has no need for further skilled therapy. He also appears to have a speech deficit.     Follow Up Recommendations   No follow up PT     Equipment Recommendations    None    Recommendations for Other Services       Precautions / Restrictions Precautions Precautions: None Restrictions Weight Bearing Restrictions: No      Mobility  Bed Mobility Overal bed mobility: Independent             General bed mobility comments: pat sat up without assist   Transfers Overall transfer level: Independent               General transfer comment: no syncope or loss of balance with standing   Ambulation/Gait Ambulation/Gait assistance: Independent Gait Distance (Feet): 150 Feet Assistive device: None   Gait velocity: normal    General Gait Details: able to ambualte with head turns and change speed without difficulty   Stairs            Wheelchair Mobility    Modified Rankin (Stroke Patients Only) Modified Rankin (Stroke Patients Only) Pre-Morbid Rankin Score: No symptoms Modified Rankin: No significant disability(speech deficits )     Balance Overall balance assessment: No apparent balance deficits (not formally assessed)                                           Pertinent Vitals/Pain Pain  Assessment: No/denies pain    Home Living Family/patient expects to be discharged to:: Private residence Living Arrangements: Other relatives Available Help at Discharge: Family;Friend(s);Available 24 hours/day Type of Home: Mobile home Home Access: Stairs to enter Entrance Stairs-Rails: Can reach both Entrance Stairs-Number of Steps: 3 Home Layout: One level Home Equipment: None Additional Comments: all home hx from PT evaluation. Pt reports living with one sister but has 2 sisters. pt also states that he still works and [employer] yards.    Prior Function Level of Independence: Independent         Comments: was working on a spetic tank when he began to have symptoms      Hand Dominance   Dominant Hand: Right    Extremity/Trunk Assessment   Upper Extremity Assessment Upper Extremity Assessment: Overall WFL for tasks assessed    Lower Extremity Assessment Lower Extremity Assessment: Overall WFL for tasks assessed    Cervical / Trunk Assessment Cervical / Trunk Assessment: Normal  Communication   Communication: Expressive difficulties  Cognition Arousal/Alertness: Awake/alert Behavior During Therapy: WFL for tasks assessed/performed Overall Cognitive Status: Within Functional Limits for tasks assessed  General Comments: some difficulty finiding his words       General Comments General comments (skin integrity, edema, etc.): per patient right peripheral vision deficits from old trumatic head injury    Exercises     Assessment/Plan    PT Assessment Patent does not need any further PT services  PT Problem List         PT Treatment Interventions      PT Goals (Current goals can be found in the Care Plan section)  Acute Rehab PT Goals Patient Stated Goal: to go home  PT Goal Formulation: With patient Time For Goal Achievement: 07/16/18 Potential to Achieve Goals: Good    Frequency     Barriers to discharge         Co-evaluation               AM-PAC PT "6 Clicks" Mobility  Outcome Measure Help needed turning from your back to your side while in a flat bed without using bedrails?: None Help needed moving from lying on your back to sitting on the side of a flat bed without using bedrails?: None Help needed moving to and from a bed to a chair (including a wheelchair)?: None Help needed standing up from a chair using your arms (e.g., wheelchair or bedside chair)?: None Help needed to walk in hospital room?: None Help needed climbing 3-5 steps with a railing? : None 6 Click Score: 24    End of Session Equipment Utilized During Treatment: Gait belt Activity Tolerance: Patient tolerated treatment well Patient left: in chair;with call bell/phone within reach;with chair alarm set Nurse Communication: Mobility status PT Visit Diagnosis: Other abnormalities of gait and mobility (R26.89)    Time: 0940-1000 PT Time Calculation (min) (ACUTE ONLY): 20 min   Charges:   PT Evaluation $PT Eval Low Complexity: 1 Low            Dessie Comaavid J Karyna Bessler PT DPT  07/09/2018, 12:37 PM

## 2018-07-09 NOTE — Progress Notes (Signed)
Arrived from Ed to 3w20. Alert and oriented to person, place, situation not time. Denies any pain.  Cal light within reach.

## 2018-07-09 NOTE — ED Notes (Signed)
Report called to rn on 3w 

## 2018-07-09 NOTE — Consult Note (Signed)
Neurology Consultation Reason for Consult: Confusion Referring Physician: Levora Angel, C  CC: Altered mental status  History is obtained from: Patient sister  HPI: [name] Cruz is a 63 y.o. male with a history of hemorrhagic stroke thought to be secondary to hypertension in 2004 with residual mild aphasia.  He was in his normal state of health when he came back in from working in his shop around 9:30 PM at which point his sister states that he was completely normal.  He then was talking on the phone around 945 with his mother who noticed that he sounded confused.  His sister then went and checked on him and found him to be slightly confused, just "not acting quite right."  EMS was called and they felt that his speech may have been slightly slurred.  The patient states that he thinks everybody is making a big deal about nothing, and that he never had any difficulty with his speech.  He states that he has problems with his speech normally and gets frustrated sometimes and when he gets worked up it is more difficult to speak.   LKW: 9:30 PM tpa given?: no, history of ICH  ROS: A 14 point ROS was performed and is negative except as noted in the HPI.  Past Medical History:  Diagnosis Date  . Acute deep vein thrombosis (DVT) of left upper extremity (HCC)    L brachial and basilic veins, eliquis started 08/22/2017 to continue for 3 months total   . Cognitive impairment 2007   after stroke, saw rehab but told to stop because was too upsetting to him  . History of chicken pox   . HLD (hyperlipidemia)   . HTN (hypertension)   . NSTEMI (non-ST elevated myocardial infarction) ([place]) 02/21/2017  . Obesity   . Stroke, hemorrhagic ([place]) 2007   thought 2/2 HTN (240sbp); residual cognitive impairment, loss of R peripheral field, no driving     Family History  Problem Relation Age of Onset  . Alzheimer's disease Maternal Grandfather   . Cancer Mother        lymphoma  . Alcohol abuse Father        smoker   . Coronary artery disease Neg Hx   . Stroke Neg Hx   . Diabetes Neg Hx      Social History:  reports that he has never smoked. He has quit using smokeless tobacco. He reports previous alcohol use. He reports that he does not use drugs.   Exam: Current vital signs: BP (!) 145/82   Pulse 76   Temp 98.6 F (37 C)   Resp 16   Ht 5' (1.524 m)   Wt 81.6 kg   SpO2 100%   BMI 35.15 kg/m  Vital signs in last 24 hours: Temp:  [98.6 F (37 C)] 98.6 F (37 C) (06/24 2334) Pulse Rate:  [76-82] 76 (06/24 2340) Resp:  [12-16] 16 (06/24 2340) BP: (145-162)/(82-87) 145/82 (06/24 2330) SpO2:  [98 %-100 %] 100 % (06/24 2340) Weight:  [81.6 kg] 81.6 kg (06/24 2334)   Physical Exam  Constitutional: Appears well-developed and well-nourished.  Psych: Affect appropriate to situation Eyes: No scleral injection HENT: No OP obstrucion Head: Normocephalic.  Cardiovascular: Normal rate and regular rhythm.  Respiratory: Effort normal, non-labored breathing GI: Soft.  No distension. There is no tenderness.  Skin: WDI  Neuro: Mental Status: Patient is awake, alert, oriented to person, place, month, year, and situation. Patient is able to give a clear and coherent history.  He has a mild mixed aphasia, but is able to follow most commands readily Cranial Nerves: II: Visual Fields are full. Pupils are equal, round, and reactive to light. III,IV, VI: EOMI without ptosis or diploplia.  V: Facial sensation is symmetric to temperature VII: Facial movement is symmetric.  VIII: hearing is intact to voice X: Uvula elevates symmetrically XI: Shoulder shrug is symmetric. XII: tongue is midline without atrophy or fasciculations.  Motor: Tone is normal. Bulk is normal. 5/5 strength was present in all four extremities.  Sensory: Sensation is symmetric to light touch and temperature in the arms and legs. Cerebellar: FNF intact bilaterally  I have reviewed labs in epic and the results pertinent to  this consultation are: CMP-Unremarkable  I have reviewed the images obtained: CT head/CTA- no significant stenosis  Impression: 63 year old male with a history of seizures and previous stroke who had a mild episode of confusion of unclear etiology.  Possibilities include stroke (though I feel this is less likely), seizure, transient worsening of his deficits due to "getting worked up."  Given that I am not certain that it was a seizure, I would not favor adjusting his seizure medications.  If he continues to have recurrent spells such as this, than this needs to be a consideration.  If MRI is negative, given that he appears to be improved and essentially back to his baseline, I am not sure I would favor further work-up at this time.  Recommendations: 1) MRI brain, if negative, no further work-up at this time 2) continue home antiepileptics 3) if MRI does reveal new ischemic stroke, he will need admission and stroke work-up.   Ritta SlotMcNeill Kirkpatrick, MD [facility] [phone]  If 7pm- 7am, please page neurology on call as listed in [place].

## 2018-07-09 NOTE — Procedures (Signed)
EEG Report  Clinical History: Dysarthria and mental status changes.  Technical Summary:  A 19 channel digital EEG recording was performed using the 10-20 international system of electrode placement.  Bipolar and Referential montages were used.  The total recording time was approx 20 minutes.  Findings:  There is a posterior dominant rhythm of 9 Hz reactive to eye opening and closure. There is relatively constant left posterior temporal and occipital theta frequency slowing.  On 5 occasions, there are epileptiform discharges with maximal negativity at T3 and spread primarily to T5 and secondarily to F7.  There are no electrographic seizures.  Sleep was not recorded.  Impression:  This is an abnormal EEG. There is evidence of cerebral dysfunction in the left posterior temporal and occipital regions as well as seizure tendency emanating mainly from the left mid to posterior temporal lobe.    Rogue Jury, MS,  MD

## 2018-07-09 NOTE — Progress Notes (Signed)
Spoke with and updated sister/POA Newt Minion of condition and plan.

## 2018-07-09 NOTE — Progress Notes (Signed)
  Echocardiogram 2D Echocardiogram has been performed.  Oneal Deputy Yovanni Frenette 07/09/2018, 9:45 AM

## 2018-07-09 NOTE — Evaluation (Signed)
Speech Language Pathology Evaluation Patient Details Name: [name] Cruz MRN: [id number] DOB: Feb 01, 1955 Today's Date: 07/09/2018 Time: 5784-6962 SLP Time Calculation (min) (ACUTE ONLY): 23 min  Problem List:  Patient Active Problem List   Diagnosis Date Noted  . Acute CVA (cerebrovascular accident) ([place]) 07/09/2018  . Status epilepticus ([place]) 03/25/2018  . Hyperkalemia 10/03/2017  . Unilateral occipital headache 10/03/2017  . Ischemic stroke ([place]) 08/23/2017  . Coronary artery disease involving coronary bypass graft of native heart without angina pectoris   . Atrial fibrillation ([place])   . Dysphagia, post-stroke   . Cardiac arrest ([place]) 08/09/2017  . S/P CABG x 3 02/24/2017  . NSTEMI (non-ST elevated myocardial infarction) ([place]) 02/21/2017  . Encephalomalacia 10/28/2012  . Seizure disorder ([place]) 04/26/2012  . Alcohol abuse 04/26/2012  . History of stroke with current residual effects   . Cognitive impairment   . HLD (hyperlipidemia)   . HTN (hypertension)    Past Medical History:  Past Medical History:  Diagnosis Date  . Acute deep vein thrombosis (DVT) of left upper extremity (HCC)    L brachial and basilic veins, eliquis started 08/22/2017 to continue for 3 months total   . Cognitive impairment 2007   after stroke, saw rehab but told to stop because was too upsetting to him  . History of chicken pox   . HLD (hyperlipidemia)   . HTN (hypertension)   . NSTEMI (non-ST elevated myocardial infarction) ([place]) 02/21/2017  . Obesity   . Stroke, hemorrhagic ([place]) 2007   thought 2/2 HTN (240sbp); residual cognitive impairment, loss of R peripheral field, no driving   Past Surgical History:  Past Surgical History:  Procedure Laterality Date  . ANKLE SURGERY  1990s   right foot with plate and screws  . CORONARY ARTERY BYPASS GRAFT N/A 02/24/2017   3v Procedure: CORONARY ARTERY BYPASS GRAFTING (CABG) x 3 ON PUMP USING LEFT INTERNAL MAMMARY ARTERY TO LEFT ANTERIOR DESENDING CORNARY  ARTERY, RIGHT GREATER SAPHENOUS VEIN TO LEFT CIRCUMFLEX ARTERY AND POSTERIOR DESENDING ARTERY. RIGHT GREATER SAPHENOUS VEIN OBTAINED VIA ENDOVEIN HARVEST.;  Surgeon: Grace Isaac, MD  . IABP INSERTION N/A 02/21/2017   Procedure: IABP Insertion;  Surgeon: Nelva Bush, MD;  Location: [facility];  Service: Cardiovascular;  Laterality: N/A;  . LEFT HEART CATH AND CORONARY ANGIOGRAPHY N/A 02/21/2017   Procedure: LEFT HEART CATH AND CORONARY ANGIOGRAPHY;  Surgeon: Nelva Bush, MD;  Location: [facility];  Service: Cardiovascular;  Laterality: N/A;  . TEE WITHOUT CARDIOVERSION N/A 02/24/2017   Procedure: TRANSESOPHAGEAL ECHOCARDIOGRAM (TEE);  Surgeon: Grace Isaac, MD;  Location: [facility];  Service: Open Heart Surgery;  Laterality: N/A;   HPI:  Pt is a 63 y.o. male with history of previous stroke, seizure disorder, CAD status post CABG who was found to be confused with speech more slurred than usual.  EMS was called and patient was brought to the ED. MRI of the brain revealed punctate acute infarct within the right cerebellar hemisphere. Age advanced volume loss with posterior left MCA territory encephalomalacia.   Assessment / Plan / Recommendation Clinical Impression  Pt reported baseline residual aphasia from a prior CVA and stated that his speech, language, and cognitive skills are currently at baseline. He did present with receptive and expressive language deficits related to auditory comprehension of complex yes/no questions, his ability to follow 2-step commands consistently, and word retrieval during conversation. However, his motor speech skills were within normal limits. Reliable assessment of the pt's cognitive-linguistic skills was  difficult due to his baseline receptive language deficits as well as difficulty with word retrieval but they appeared to be grossly within functional limits with consideration that he will have supervision and assistance from his sisters at  home for medication and financial management. Further skilled SLP services are not clinically indicated at this time. Pt, and nursing were educated regarding results and recommendations; both parties verbalized understanding as well as agreement with plan of care.    SLP Assessment  SLP Recommendation/Assessment: Patient does not need any further Speech Lanaguage Pathology Services SLP Visit Diagnosis: Aphasia (R47.01)    Follow Up Recommendations       Frequency and Duration           SLP Evaluation Cognition  Overall Cognitive Status: Difficult to assess Arousal/Alertness: Awake/alert Orientation Level: Oriented to person;Oriented to place;Oriented to time;Disoriented to time(Oriented to year not month, date, day) Attention: Focused;Sustained Memory: Impaired Memory Impairment: Retrieval deficit;Decreased recall of new information(Immediate: 3/3; with cues: 0/3) Problem Solving: Appears intact       Comprehension  Auditory Comprehension Overall Auditory Comprehension: Impaired at baseline Yes/No Questions: Impaired Basic Biographical Questions: (5/5) Complex Questions: (2/5) Commands: Impaired One Step Basic Commands: (5/5) Two Step Basic Commands: (2/4) Conversation: Simple    Expression Expression Primary Mode of Expression: Verbal Verbal Expression Overall Verbal Expression: Impaired at baseline Initiation: No impairment Automatic Speech: Counting;Day of week(WNL; Months: unable to initiate sequence) Level of Generative/Spontaneous Verbalization: Sentence;Conversation Repetition: Impaired Level of Impairment: Sentence level(0/5) Naming: Impairment Responsive: (1/5) Confrontation: Impaired(7/10) Convergent: (Sentence completion: 3/5)   Oral / Motor  Oral Motor/Sensory Function Overall Oral Motor/Sensory Function: Within functional limits Motor Speech Overall Motor Speech: Appears within functional limits for tasks assessed Respiration: Within functional  limits Phonation: Normal Resonance: Within functional limits Articulation: Within functional limitis Intelligibility: Intelligible Motor Planning: Witnin functional limits Motor Speech Errors: Not applicable   Elliyah Liszewski I. Vear ClockPhillips, MS, CCC-SLP Acute Rehabilitation Services Office number [phone] Pager [phone]                    Scheryl MartenShanika I Anivea Velasques 07/09/2018, 4:03 PM

## 2018-07-09 NOTE — Progress Notes (Signed)
Small embolic appearing cerebellar stroke on MRI. He will need Lipids, a1c, echo. Would start ASA for antiplatelet therapy.   Roland Rack, MD [facility] [phone]  If 7pm- 7am, please page neurology on call as listed in [place].

## 2018-07-09 NOTE — ED Notes (Signed)
SISTER AND THE PTS MOTHER HAVE BEEN UPDATED  ON THE PTS PROGRESS

## 2018-07-09 NOTE — ED Notes (Signed)
Pt passed the swallow screen 

## 2018-07-09 NOTE — Progress Notes (Signed)
SLP Cancellation Note  Patient Details Name: MACALLISTER ASHMEAD MRN: [id number] DOB: Dec 04, 1955   Cancelled treatment:       Reason Eval/Treat Not Completed: Patient at procedure or test/unavailable(Pt currently being set up for EEG. SLP will re-attempt later today or on subsequent date as able.)  Lariza Cothron I. Hardin Negus, [place], [place] Office number [phone] Pager [place] 07/09/2018, 2:07 PM

## 2018-07-09 NOTE — Progress Notes (Signed)
OT Screen Note  Patient Details Name: [name] Cruz MRN: [id number] DOB: 30-Nov-1955   Cancelled Treatment:    Reason Eval/Treat Not Completed: OT screened, no needs identified, will sign off(Pt independent with ADL and mobility, no OT required.)  Pt with no focal deficits and able to compensate for  Residual deficits for R eye vision from previous CVA.  Ebony Hail Harold Hedge) Marsa Aris OTR/L Acute Rehabilitation Services Pager: [phone] Office: [place] 07/09/2018, 10:33 AM

## 2018-07-09 NOTE — Progress Notes (Signed)
STROKE TEAM PROGRESS NOTE  HPI: [name] MileBilly J Cruz is a 63 y.o. male with a history of hemorrhagic stroke thought to be secondary to hypertension in 2004 with residual mild aphasia.  He was in his normal state of health when he came back in from working in his shop around 9:30 PM at which point his sister states that he was completely normal.  He then was talking on the phone around 945 with his mother who noticed that he sounded confused.  His sister then went and checked on him and found him to be slightly confused, just "not acting quite right."  EMS was called and they felt that his speech may have been slightly slurred.  The patient states that he thinks everybody is making a big deal about nothing, and that he never had any difficulty with his speech.  He states that he has problems with his speech normally and gets frustrated sometimes and when he gets worked up it is more difficult to speak. LKW: 9:30 PM 07/08/2018 tpa given?: no, history of ICH  INTERVAL HISTORY I have personally reviewed history of presenting illness with the patient and imaging films in PACS.  He states that he crawled beneath the floor of his house to clean the clock pipe which had mildly.  When he came out he was confused and had speech difficulties.  This appears to have improved now.  MRI scan shows a tiny punctate right cerebellar infarct which perhaps cannot explain his confusion.  He does have a prior history of left temporal hemorrhage with residual mild expressive aphasia and right peripheral visual  field loss.  Vitals:   07/09/18 0444 07/09/18 0631 07/09/18 0700 07/09/18 1036  BP: 126/73 117/79 109/62 (!) 124/96  Pulse: (!) 53 (!) 56 76 77  Resp: 14 14 15 18   Temp: 97.7 F (36.5 C) 98.4 F (36.9 C) 97.9 F (36.6 C)   TempSrc: Oral Oral Axillary   SpO2: 100% 99% 100% 98%  Weight:      Height:        CBC:  Recent Labs  Lab 07/08/18 2314 07/09/18 0416  WBC 9.2 7.0  NEUTROABS 5.4  --   HGB 12.6* 12.6*  HCT  38.8* 37.9*  MCV 92.8 90.2  PLT 190 200    Basic Metabolic Panel:  Recent Labs  Lab 07/08/18 2314 07/09/18 0416  NA 135 140  K 3.9 3.7  CL 101 107  CO2 24 25  GLUCOSE 105* 103*  BUN 5* <5*  CREATININE 0.79 0.71  CALCIUM 8.9 9.1   Lipid Panel:     Component Value Date/Time   CHOL 120 07/09/2018 0416   CHOL 177 07/11/2017 0950   TRIG 78 07/09/2018 0416   HDL 35 (L) 07/09/2018 0416   HDL 96 07/11/2017 0950   CHOLHDL 3.4 07/09/2018 0416   VLDL 16 07/09/2018 0416   LDLCALC 69 07/09/2018 0416   LDLCALC 65 07/11/2017 0950   HgbA1c:  Lab Results  Component Value Date   HGBA1C 5.6 07/09/2018   Urine Drug Screen:     Component Value Date/Time   LABOPIA NONE DETECTED 07/09/2018 0000   COCAINSCRNUR NONE DETECTED 07/09/2018 0000   LABBENZ NONE DETECTED 07/09/2018 0000   AMPHETMU NONE DETECTED 07/09/2018 0000   THCU NONE DETECTED 07/09/2018 0000   LABBARB NONE DETECTED 07/09/2018 0000    Alcohol Level     Component Value Date/Time   ETH <10 07/08/2018 2314    IMAGING Ct Angio Head W Or  Wo Contrast  Result Date: 07/08/2018 CLINICAL DATA:  Slurred speech and altered mental status EXAM: CT ANGIOGRAPHY HEAD AND NECK TECHNIQUE: Multidetector CT imaging of the head and neck was performed using the standard protocol during bolus administration of intravenous contrast. Multiplanar CT image reconstructions and MIPs were obtained to evaluate the vascular anatomy. Carotid stenosis measurements (when applicable) are obtained utilizing NASCET criteria, using the distal internal carotid diameter as the denominator. CONTRAST:  75mL OMNIPAQUE IOHEXOL 350 MG/ML SOLN COMPARISON:  Head CT 07/08/2018, 03/25/2018 FINDINGS: CTA NECK FINDINGS SKELETON: There is no bony spinal canal stenosis. No lytic or blastic lesion. OTHER NECK: Normal pharynx, larynx and major salivary glands. No cervical lymphadenopathy. Unremarkable thyroid gland. UPPER CHEST: No pneumothorax or pleural effusion. No nodules  or masses. AORTIC ARCH: There is no calcific atherosclerosis of the aortic arch. There is no aneurysm, dissection or hemodynamically significant stenosis of the visualized ascending aorta and aortic arch. Normal variant aortic arch branching pattern with the left vertebral artery arising independently from the aortic arch. The visualized proximal subclavian arteries are widely patent. RIGHT CAROTID SYSTEM: --Common carotid artery: Widely patent origin without common carotid artery dissection or aneurysm. --Internal carotid artery: No dissection, occlusion or aneurysm. There is mixed density atherosclerosis extending into the proximal ICA, resulting in 60% stenosis. --External carotid artery: No acute abnormality. LEFT CAROTID SYSTEM: --Common carotid artery: Widely patent origin without common carotid artery dissection or aneurysm. --Internal carotid artery: No dissection, occlusion or aneurysm. There is mixed density atherosclerosis extending into the proximal ICA, resulting in less than 50% stenosis. --External carotid artery: No acute abnormality. VERTEBRAL ARTERIES: Left dominant configuration. Both origins are clearly patent. No dissection, occlusion or flow-limiting stenosis to the skull base (V1-V3 segments). CTA HEAD FINDINGS POSTERIOR CIRCULATION: --Vertebral arteries: Normal V4 segments. --Posterior inferior cerebellar arteries (PICA): Patent origins from the vertebral arteries. --Anterior inferior cerebellar arteries (AICA): Patent origins from the basilar artery. --Basilar artery: Normal. --Superior cerebellar arteries: Normal. --Posterior cerebral arteries (PCA): Normal. The right PCA is predominantly supplied by the posterior communicating artery. ANTERIOR CIRCULATION: --Intracranial internal carotid arteries: Atherosclerotic calcification of the internal carotid arteries at the skull base without hemodynamically significant stenosis. --Anterior cerebral arteries (ACA): Normal. Both A1 segments are  present. Patent anterior communicating artery (a-comm). --Middle cerebral arteries (MCA): Normal. VENOUS SINUSES: As permitted by contrast timing, patent. ANATOMIC VARIANTS: Fetal right posterior cerebral artery. Review of the MIP images confirms the above findings. IMPRESSION: 1. No emergent large vessel occlusion. 2. 60% stenosis of the proximal right internal carotid artery due to mixed density atherosclerosis. 3. Left carotid bifurcation atherosclerosis with less than 50% stenosis of the proximal left internal carotid artery. Electronically Signed   By: Deatra RobinsonKevin  Herman M.D.   On: 07/08/2018 23:14   Ct Angio Neck W Or Wo Contrast  Result Date: 07/08/2018 CLINICAL DATA:  Slurred speech and altered mental status EXAM: CT ANGIOGRAPHY HEAD AND NECK TECHNIQUE: Multidetector CT imaging of the head and neck was performed using the standard protocol during bolus administration of intravenous contrast. Multiplanar CT image reconstructions and MIPs were obtained to evaluate the vascular anatomy. Carotid stenosis measurements (when applicable) are obtained utilizing NASCET criteria, using the distal internal carotid diameter as the denominator. CONTRAST:  75mL OMNIPAQUE IOHEXOL 350 MG/ML SOLN COMPARISON:  Head CT 07/08/2018, 03/25/2018 FINDINGS: CTA NECK FINDINGS SKELETON: There is no bony spinal canal stenosis. No lytic or blastic lesion. OTHER NECK: Normal pharynx, larynx and major salivary glands. No cervical lymphadenopathy. Unremarkable thyroid gland. UPPER CHEST:  No pneumothorax or pleural effusion. No nodules or masses. AORTIC ARCH: There is no calcific atherosclerosis of the aortic arch. There is no aneurysm, dissection or hemodynamically significant stenosis of the visualized ascending aorta and aortic arch. Normal variant aortic arch branching pattern with the left vertebral artery arising independently from the aortic arch. The visualized proximal subclavian arteries are widely patent. RIGHT CAROTID SYSTEM:  --Common carotid artery: Widely patent origin without common carotid artery dissection or aneurysm. --Internal carotid artery: No dissection, occlusion or aneurysm. There is mixed density atherosclerosis extending into the proximal ICA, resulting in 60% stenosis. --External carotid artery: No acute abnormality. LEFT CAROTID SYSTEM: --Common carotid artery: Widely patent origin without common carotid artery dissection or aneurysm. --Internal carotid artery: No dissection, occlusion or aneurysm. There is mixed density atherosclerosis extending into the proximal ICA, resulting in less than 50% stenosis. --External carotid artery: No acute abnormality. VERTEBRAL ARTERIES: Left dominant configuration. Both origins are clearly patent. No dissection, occlusion or flow-limiting stenosis to the skull base (V1-V3 segments). CTA HEAD FINDINGS POSTERIOR CIRCULATION: --Vertebral arteries: Normal V4 segments. --Posterior inferior cerebellar arteries (PICA): Patent origins from the vertebral arteries. --Anterior inferior cerebellar arteries (AICA): Patent origins from the basilar artery. --Basilar artery: Normal. --Superior cerebellar arteries: Normal. --Posterior cerebral arteries (PCA): Normal. The right PCA is predominantly supplied by the posterior communicating artery. ANTERIOR CIRCULATION: --Intracranial internal carotid arteries: Atherosclerotic calcification of the internal carotid arteries at the skull base without hemodynamically significant stenosis. --Anterior cerebral arteries (ACA): Normal. Both A1 segments are present. Patent anterior communicating artery (a-comm). --Middle cerebral arteries (MCA): Normal. VENOUS SINUSES: As permitted by contrast timing, patent. ANATOMIC VARIANTS: Fetal right posterior cerebral artery. Review of the MIP images confirms the above findings. IMPRESSION: 1. No emergent large vessel occlusion. 2. 60% stenosis of the proximal right internal carotid artery due to mixed density  atherosclerosis. 3. Left carotid bifurcation atherosclerosis with less than 50% stenosis of the proximal left internal carotid artery. Electronically Signed   By: Ulyses Jarred M.D.   On: 07/08/2018 23:14   Mr Brain Wo Contrast  Result Date: 07/09/2018 CLINICAL DATA:  Facial droop and slurred speech EXAM: MRI HEAD WITHOUT CONTRAST TECHNIQUE: Multiplanar, multiecho pulse sequences of the brain and surrounding structures were obtained without intravenous contrast. COMPARISON:  CTA head neck 07/08/2018 FINDINGS: BRAIN: Small focus of abnormal diffusion restriction within the right cerebellum. The midline structures are normal. Early confluent hyperintense T2-weighted signal of the periventricular and deep white matter, most commonly due to chronic ischemic microangiopathy. Advanced atrophy for age. There is ballooning of the temporal horn of the left lateral ventricle secondary to encephalomalacia from associated infarct. Scattered chronic microhemmorhages in a peripheral predominant distribution. There is hemosiderin deposition at the site of the old posterior left MCA territory infarct. No midline shift or other mass effect. VASCULAR: The major intracranial arterial and venous sinus flow voids are normal. SKULL AND UPPER CERVICAL SPINE: Calvarial bone marrow signal is normal. There is no skull base mass. Visualized upper cervical spine and soft tissues are normal. SINUSES/ORBITS: No fluid levels or advanced mucosal thickening. No mastoid or middle ear effusion. The orbits are normal. IMPRESSION: 1. Punctate acute infarct within the right cerebellar hemisphere. No acute hemorrhage or mass effect. 2. Age advanced volume loss with posterior left MCA territory encephalomalacia. 3. Multiple scattered, peripheral predominant chronic microhemorrhages. Electronically Signed   By: Ulyses Jarred M.D.   On: 07/09/2018 00:57   Ct Head Code Stroke Wo Contrast  Result Date: 07/08/2018 CLINICAL DATA:  Code stroke. Slurred  speech and altered mental status EXAM: CT HEAD WITHOUT CONTRAST TECHNIQUE: Contiguous axial images were obtained from the base of the skull through the vertex without intravenous contrast. COMPARISON:  03/25/2018 FINDINGS: Brain: There is no mass, hemorrhage or extra-axial collection. There is generalized brain atrophy greater than expected at this age. There is hypoattenuation of the periventricular white matter, most commonly indicating chronic ischemic microangiopathy. There is an old posterior left MCA territory infarct, unchanged. Vascular: No abnormal hyperdensity of the major intracranial arteries or dural venous sinuses. No intracranial atherosclerosis. Skull: The visualized skull base, calvarium and extracranial soft tissues are normal. Sinuses/Orbits: No fluid levels or advanced mucosal thickening of the visualized paranasal sinuses. No mastoid or middle ear effusion. The orbits are normal. ASPECTS [facility]([place] Stroke Program Early CT Score) - Ganglionic level infarction (caudate, lentiform nuclei, internal capsule, insula, M1-M3 cortex): 7 - Supraganglionic infarction (M4-M6 cortex): 3 Total score (0-10 with 10 being normal): 10 IMPRESSION: 1. No acute intracranial hemorrhage. 2. Old posterior left MCA territory infarct, chronic ischemic microangiopathy and age advanced parenchymal atrophy. 3. ASPECTS is 10. These results were communicated to Dr. Ritta SlotMcNeill Kirkpatrick at 11:00 pm on 07/08/2018 by text page via the [facility] messaging system. Electronically Signed   By: Deatra RobinsonKevin  Herman M.D.   On: 07/08/2018 23:01    PHYSICAL EXAM Pleasant middle-age Caucasian male not in distress. . Afebrile. Head is nontraumatic. Neck is supple without bruit.    Cardiac exam no murmur or gallop. Lungs are clear to auscultation. Distal pulses are well felt. Neurological Exam ;  Awake  Alert oriented x 3.  Mild expressive aphasia with nonfluent speech and word finding difficulties.  Impaired naming and repetition but good  comprehension.  Follows three-step commands.  Eye movements full without nystagmus.fundi were not visualized. Vision acuity appears normal.  Mild right-sided peripheral visual field loss hearing is normal. Palatal movements are normal. Face symmetric. Tongue midline. Normal strength, tone, reflexes and coordination. Normal sensation. Gait deferred.  ASSESSMENT/PLAN [name] is a 63 y.o. male with history of hemorrhagic stroke d/t HTN in 2004 with resultant mild aphasia, Sz d/o CAD s/p CABG presenting with transient confusion, slurred speech while talking on the phone.   Confusion  Sx started after he was under the house cleaning out algae - possibly related  Stroke:  Incidental R cerebellar infarct secondary to small vessel disease source - not the source of confusion  Code Stroke CT head No acute abnormality. Old L MCA infarct. Small vessel disease. Atrophy. ASPECTS 10.     CTA head & neck no ELVO. R ICA 60% mixed density. L ICA < 50%.  MRI  Punctate R cerebellar infarct. Old L MCA infarcts. Mult scattered micro hmgs. Small vessel disease. Atrophy.   2D Echo pending  LDL 69  HgbA1c 5.6  Lovenox 40 mg sq daily for VTE prophylaxis  aspirin 81 mg daily prior to admission, now on aspirin 325 mg daily. Do not recommend addition of plavix given multiple micro hemorrhages seen on imaging. Continue aspirin alone at discharge.   Therapy recommendations:  No therapy needs  Disposition:  Return home  Hypertension  Stable . Permissive hypertension (OK if < 220/120) but gradually normalize in 5-7 days . Long-term BP goal normotensive  Hyperlipidemia  Home meds:  lipitor 80, resumed in hospital  LDL 69, goal < 70  Continue statin at discharge  Other Stroke Risk Factors  Hx ETOH use  Obesity, Body mass index is 32.25 kg/m.,  recommend weight loss, diet and exercise as appropriate   Hx stroke/TIA  L MCA w/ residual aphasia  Coronary artery disease hx NSTEMI, s/p  CABG  Hx LUE DVT tx w/ AC x 3 mo in 08/2017  Other Active Problems  Cognitive impairment  Seizure d/o on vimpat and keppra PTA  Hospital day # 0  I have personally obtained history,examined this patient, reviewed notes, independently viewed imaging studies, participated in medical decision making and plan of care.ROS completed by me personally and pertinent positives fully documented  I have made any additions or clarifications directly to the above note.  He presented with transient episode of confusion of unclear etiology possibly related to exposures to some toxic gases when cleaning up a cog pipe beneath his house.  He does have a tiny right cerebellar infarct on his MRI which is likely incidental finding.  Recommend check EEG for seizure activity and echocardiogram.  Continue aspirin for stroke prevention alone given history of intracerebral hemorrhage.  Discussed with Dr. Theda Belfast.  Greater than 50% time during this 35-minute visit was spent in counseling and coordination of care about his cerebellar infarct and confusion and answering questions.  Delia Heady, MD Medical Director [facility] Pager: [phone] 07/09/2018 1:18 PM   To contact Stroke Continuity provider, please refer to [email or web address]. After hours, contact General Neurology

## 2018-07-10 ENCOUNTER — Other Ambulatory Visit: Payer: Self-pay

## 2018-07-10 DIAGNOSIS — I639 Cerebral infarction, unspecified: Secondary | ICD-10-CM | POA: Diagnosis present

## 2018-07-10 DIAGNOSIS — R569 Unspecified convulsions: Secondary | ICD-10-CM

## 2018-07-10 MED ORDER — ASPIRIN 325 MG PO TABS: 325.0000 mg | ORAL_TABLET | Freq: Every day | ORAL | 2 refills | Status: DC

## 2018-07-10 MED ORDER — VIMPAT 100 MG PO TABS: 100.0000 mg | ORAL_TABLET | Freq: Two times a day (BID) | ORAL | 0 refills | Status: DC

## 2018-07-10 NOTE — TOC Initial Note (Signed)
Transition of Care [facility]) - Initial/Assessment Note    Patient Details  Name: [name] Cruz MRN: [id number] Date of Birth: February 28, 1955  [facility] of [facility]) CM/SW Contact:    Pollie Friar, RN Phone Number: 07/10/2018, 12:52 PM  Clinical Narrative:                   Expected Discharge Plan: Home/Self Care Barriers to Discharge: No Barriers Identified   Patient Goals and CMS Choice        Expected Discharge Plan and Services Expected Discharge Plan: Home/Self Care       Living arrangements for the past 2 months: Single Family Home Expected Discharge Date: 07/10/18                                    Prior Living Arrangements/Services Living arrangements for the past 2 months: Single Family Home Lives with:: Siblings(sister) Patient language and need for interpreter reviewed:: Yes(no needs) Do you feel safe going back to the place where you live?: Yes      Need for Family Participation in Patient Care: Yes (Comment) Care giver support system in place?: Yes (comment)(sister able to provide supervision) Current home services: DME(walker, shower seat,) Criminal Activity/Legal Involvement Pertinent to Current Situation/Hospitalization: No - Comment as needed  Activities of Daily Living Home Assistive Devices/Equipment: Cane (specify quad or straight), Walker (specify type) ADL Screening (condition at time of admission) Patient's cognitive ability adequate to safely complete daily activities?: Yes Is the patient deaf or have difficulty hearing?: No Does the patient have difficulty seeing, even when wearing glasses/contacts?: No Does the patient have difficulty concentrating, remembering, or making decisions?: Yes Patient able to express need for assistance with ADLs?: Yes Does the patient have difficulty dressing or bathing?: No Independently performs ADLs?: Yes (appropriate for developmental age) Does the patient have difficulty walking or climbing stairs?:  No Weakness of Legs: None Weakness of Arms/Hands: None  Permission Sought/Granted                  Emotional Assessment Appearance:: Appears stated age Attitude/Demeanor/Rapport: Engaged Affect (typically observed): Accepting, Pleasant Orientation: : Oriented to Self, Oriented to Place, Oriented to  Time, Oriented to Situation   Psych Involvement: No (comment)  Admission diagnosis:  Acute CVA (cerebrovascular accident) [facility]) [I63.9] Patient Active Problem List   Diagnosis Date Noted  . Cerebellar stroke, acute ([place]) 07/10/2018  . Postictal state ([place]) 07/10/2018  . Status epilepticus ([place]) 03/25/2018  . Hyperkalemia 10/03/2017  . Unilateral occipital headache 10/03/2017  . Ischemic stroke ([place]) 08/23/2017  . Coronary artery disease involving coronary bypass graft of native heart without angina pectoris   . Atrial fibrillation ([place])   . Dysphagia, post-stroke   . Cardiac arrest ([place]) 08/09/2017  . S/P CABG x 3 02/24/2017  . NSTEMI (non-ST elevated myocardial infarction) ([place]) 02/21/2017  . Encephalomalacia 10/28/2012  . Seizure disorder ([place]) 04/26/2012  . Alcohol abuse 04/26/2012  . History of stroke with current residual effects   . Cognitive impairment   . HLD (hyperlipidemia)   . HTN (hypertension)    PCP:  Ria Bush, MD Pharmacy:   [facility], [address] [place] [address] Phone: [phone] Fax: [phone]  [id number] [address] [place] [address] Phone: [phone] Fax: [phone]  Social Determinants of Health (SDOH) Interventions    Readmission Risk Interventions No flowsheet data found.

## 2018-07-10 NOTE — TOC Transition Note (Signed)
Transition of Care [facility]) - CM/SW Discharge Note   Patient Details  Name: [name] Cruz MRN: [id number] Date of Birth: 08-10-1955  Transition [facility]) CM/SW Contact:  Pollie Friar, RN Phone Number: 07/10/2018, 12:52 PM   Clinical Narrative:    Pt discharging home with self care. CM called and spoke to Butch Penny (sister) about concerns over him taking Vimpat as prescribed at home. Butch Penny is going to make sure he has the medication and that it is administered properly. Family to provide transport home.   Final next level of care: Home/Self Care Barriers to Discharge: No Barriers Identified   Patient Goals and CMS Choice        Discharge Placement                       Discharge Plan and Services                                     Social Determinants of Health (SDOH) Interventions     Readmission Risk Interventions No flowsheet data found.

## 2018-07-10 NOTE — Progress Notes (Signed)
STROKE TEAM PROGRESS NOTE    INTERVAL HISTORY Patient's states his confusion is improving though is continues to have expressive language difficulties.  He did not have any witnessed seizures but review of his chart says that he has prior history of seizures and does take both Keppra and Vimpat at home.Marland Kitchen  He was on Keppra 1500 mg daily but the dose was reduced in March 2 100 mg daily because he complained of fatigue.  He is followed in the office and last had a virtual video visit with Shanda Bumps my nurse practitioner on 04/08/2018  EEG 07/09/2018 showed seizure like activity of L mid-posterior temporal lobe. Patient is not able to give further details regarding previous seizure diagnosis/treatment of seizure disorder. Otherwise, he is awake, following commands. Speech is slightly dysarthric.  Per patient records, he does have hx of seizures and is prescribed keppra 500 mg QD and Vimpat 100 mg BID.  Vitals:   07/09/18 2317 07/10/18 0328 07/10/18 0452 07/10/18 0500  BP: 107/64 104/60 116/77   Pulse:   62 (!) 51  Resp:   13 11  Temp: 98.5 F (36.9 C) 97.9 F (36.6 C)    TempSrc: Oral Oral    SpO2:   97% 97%  Weight:      Height:        CBC:  Recent Labs  Lab 07/08/18 2314 07/09/18 0416  WBC 9.2 7.0  NEUTROABS 5.4  --   HGB 12.6* 12.6*  HCT 38.8* 37.9*  MCV 92.8 90.2  PLT 190 200    Basic Metabolic Panel:  Recent Labs  Lab 07/08/18 2314 07/09/18 0416  NA 135 140  K 3.9 3.7  CL 101 107  CO2 24 25  GLUCOSE 105* 103*  BUN 5* <5*  CREATININE 0.79 0.71  CALCIUM 8.9 9.1   Lipid Panel:     Component Value Date/Time   CHOL 120 07/09/2018 0416   CHOL 177 07/11/2017 0950   TRIG 78 07/09/2018 0416   HDL 35 (L) 07/09/2018 0416   HDL 96 07/11/2017 0950   CHOLHDL 3.4 07/09/2018 0416   VLDL 16 07/09/2018 0416   LDLCALC 69 07/09/2018 0416   LDLCALC 65 07/11/2017 0950   HgbA1c:  Lab Results  Component Value Date   HGBA1C 5.6 07/09/2018   Urine Drug Screen:     Component  Value Date/Time   LABOPIA NONE DETECTED 07/09/2018 0000   COCAINSCRNUR NONE DETECTED 07/09/2018 0000   LABBENZ NONE DETECTED 07/09/2018 0000   AMPHETMU NONE DETECTED 07/09/2018 0000   THCU NONE DETECTED 07/09/2018 0000   LABBARB NONE DETECTED 07/09/2018 0000    Alcohol Level     Component Value Date/Time   ETH <10 07/08/2018 2314    IMAGING Ct Angio Head W Or Wo Contrast  Result Date: 07/08/2018 CLINICAL DATA:  Slurred speech and altered mental status EXAM: CT ANGIOGRAPHY HEAD AND NECK TECHNIQUE: Multidetector CT imaging of the head and neck was performed using the standard protocol during bolus administration of intravenous contrast. Multiplanar CT image reconstructions and MIPs were obtained to evaluate the vascular anatomy. Carotid stenosis measurements (when applicable) are obtained utilizing NASCET criteria, using the distal internal carotid diameter as the denominator. CONTRAST:  75mL OMNIPAQUE IOHEXOL 350 MG/ML SOLN COMPARISON:  Head CT 07/08/2018, 03/25/2018 FINDINGS: CTA NECK FINDINGS SKELETON: There is no bony spinal canal stenosis. No lytic or blastic lesion. OTHER NECK: Normal pharynx, larynx and major salivary glands. No cervical lymphadenopathy. Unremarkable thyroid gland. UPPER CHEST: No pneumothorax or pleural effusion.  No nodules or masses. AORTIC ARCH: There is no calcific atherosclerosis of the aortic arch. There is no aneurysm, dissection or hemodynamically significant stenosis of the visualized ascending aorta and aortic arch. Normal variant aortic arch branching pattern with the left vertebral artery arising independently from the aortic arch. The visualized proximal subclavian arteries are widely patent. RIGHT CAROTID SYSTEM: --Common carotid artery: Widely patent origin without common carotid artery dissection or aneurysm. --Internal carotid artery: No dissection, occlusion or aneurysm. There is mixed density atherosclerosis extending into the proximal ICA, resulting in 60%  stenosis. --External carotid artery: No acute abnormality. LEFT CAROTID SYSTEM: --Common carotid artery: Widely patent origin without common carotid artery dissection or aneurysm. --Internal carotid artery: No dissection, occlusion or aneurysm. There is mixed density atherosclerosis extending into the proximal ICA, resulting in less than 50% stenosis. --External carotid artery: No acute abnormality. VERTEBRAL ARTERIES: Left dominant configuration. Both origins are clearly patent. No dissection, occlusion or flow-limiting stenosis to the skull base (V1-V3 segments). CTA HEAD FINDINGS POSTERIOR CIRCULATION: --Vertebral arteries: Normal V4 segments. --Posterior inferior cerebellar arteries (PICA): Patent origins from the vertebral arteries. --Anterior inferior cerebellar arteries (AICA): Patent origins from the basilar artery. --Basilar artery: Normal. --Superior cerebellar arteries: Normal. --Posterior cerebral arteries (PCA): Normal. The right PCA is predominantly supplied by the posterior communicating artery. ANTERIOR CIRCULATION: --Intracranial internal carotid arteries: Atherosclerotic calcification of the internal carotid arteries at the skull base without hemodynamically significant stenosis. --Anterior cerebral arteries (ACA): Normal. Both A1 segments are present. Patent anterior communicating artery (a-comm). --Middle cerebral arteries (MCA): Normal. VENOUS SINUSES: As permitted by contrast timing, patent. ANATOMIC VARIANTS: Fetal right posterior cerebral artery. Review of the MIP images confirms the above findings. IMPRESSION: 1. No emergent large vessel occlusion. 2. 60% stenosis of the proximal right internal carotid artery due to mixed density atherosclerosis. 3. Left carotid bifurcation atherosclerosis with less than 50% stenosis of the proximal left internal carotid artery. Electronically Signed   By: Ulyses Jarred M.D.   On: 07/08/2018 23:14   Ct Angio Neck W Or Wo Contrast  Result Date:  07/08/2018 CLINICAL DATA:  Slurred speech and altered mental status EXAM: CT ANGIOGRAPHY HEAD AND NECK TECHNIQUE: Multidetector CT imaging of the head and neck was performed using the standard protocol during bolus administration of intravenous contrast. Multiplanar CT image reconstructions and MIPs were obtained to evaluate the vascular anatomy. Carotid stenosis measurements (when applicable) are obtained utilizing NASCET criteria, using the distal internal carotid diameter as the denominator. CONTRAST:  81mL OMNIPAQUE IOHEXOL 350 MG/ML SOLN COMPARISON:  Head CT 07/08/2018, 03/25/2018 FINDINGS: CTA NECK FINDINGS SKELETON: There is no bony spinal canal stenosis. No lytic or blastic lesion. OTHER NECK: Normal pharynx, larynx and major salivary glands. No cervical lymphadenopathy. Unremarkable thyroid gland. UPPER CHEST: No pneumothorax or pleural effusion. No nodules or masses. AORTIC ARCH: There is no calcific atherosclerosis of the aortic arch. There is no aneurysm, dissection or hemodynamically significant stenosis of the visualized ascending aorta and aortic arch. Normal variant aortic arch branching pattern with the left vertebral artery arising independently from the aortic arch. The visualized proximal subclavian arteries are widely patent. RIGHT CAROTID SYSTEM: --Common carotid artery: Widely patent origin without common carotid artery dissection or aneurysm. --Internal carotid artery: No dissection, occlusion or aneurysm. There is mixed density atherosclerosis extending into the proximal ICA, resulting in 60% stenosis. --External carotid artery: No acute abnormality. LEFT CAROTID SYSTEM: --Common carotid artery: Widely patent origin without common carotid artery dissection or aneurysm. --Internal carotid artery: No dissection,  occlusion or aneurysm. There is mixed density atherosclerosis extending into the proximal ICA, resulting in less than 50% stenosis. --External carotid artery: No acute abnormality.  VERTEBRAL ARTERIES: Left dominant configuration. Both origins are clearly patent. No dissection, occlusion or flow-limiting stenosis to the skull base (V1-V3 segments). CTA HEAD FINDINGS POSTERIOR CIRCULATION: --Vertebral arteries: Normal V4 segments. --Posterior inferior cerebellar arteries (PICA): Patent origins from the vertebral arteries. --Anterior inferior cerebellar arteries (AICA): Patent origins from the basilar artery. --Basilar artery: Normal. --Superior cerebellar arteries: Normal. --Posterior cerebral arteries (PCA): Normal. The right PCA is predominantly supplied by the posterior communicating artery. ANTERIOR CIRCULATION: --Intracranial internal carotid arteries: Atherosclerotic calcification of the internal carotid arteries at the skull base without hemodynamically significant stenosis. --Anterior cerebral arteries (ACA): Normal. Both A1 segments are present. Patent anterior communicating artery (a-comm). --Middle cerebral arteries (MCA): Normal. VENOUS SINUSES: As permitted by contrast timing, patent. ANATOMIC VARIANTS: Fetal right posterior cerebral artery. Review of the MIP images confirms the above findings. IMPRESSION: 1. No emergent large vessel occlusion. 2. 60% stenosis of the proximal right internal carotid artery due to mixed density atherosclerosis. 3. Left carotid bifurcation atherosclerosis with less than 50% stenosis of the proximal left internal carotid artery. Electronically Signed   By: Deatra RobinsonKevin  Herman M.D.   On: 07/08/2018 23:14   Mr Brain Wo Contrast  Result Date: 07/09/2018 CLINICAL DATA:  Facial droop and slurred speech EXAM: MRI HEAD WITHOUT CONTRAST TECHNIQUE: Multiplanar, multiecho pulse sequences of the brain and surrounding structures were obtained without intravenous contrast. COMPARISON:  CTA head neck 07/08/2018 FINDINGS: BRAIN: Small focus of abnormal diffusion restriction within the right cerebellum. The midline structures are normal. Early confluent hyperintense  T2-weighted signal of the periventricular and deep white matter, most commonly due to chronic ischemic microangiopathy. Advanced atrophy for age. There is ballooning of the temporal horn of the left lateral ventricle secondary to encephalomalacia from associated infarct. Scattered chronic microhemmorhages in a peripheral predominant distribution. There is hemosiderin deposition at the site of the old posterior left MCA territory infarct. No midline shift or other mass effect. VASCULAR: The major intracranial arterial and venous sinus flow voids are normal. SKULL AND UPPER CERVICAL SPINE: Calvarial bone marrow signal is normal. There is no skull base mass. Visualized upper cervical spine and soft tissues are normal. SINUSES/ORBITS: No fluid levels or advanced mucosal thickening. No mastoid or middle ear effusion. The orbits are normal. IMPRESSION: 1. Punctate acute infarct within the right cerebellar hemisphere. No acute hemorrhage or mass effect. 2. Age advanced volume loss with posterior left MCA territory encephalomalacia. 3. Multiple scattered, peripheral predominant chronic microhemorrhages. Electronically Signed   By: Deatra RobinsonKevin  Herman M.D.   On: 07/09/2018 00:57   Ct Head Code Stroke Wo Contrast  Result Date: 07/08/2018 CLINICAL DATA:  Code stroke. Slurred speech and altered mental status EXAM: CT HEAD WITHOUT CONTRAST TECHNIQUE: Contiguous axial images were obtained from the base of the skull through the vertex without intravenous contrast. COMPARISON:  03/25/2018 FINDINGS: Brain: There is no mass, hemorrhage or extra-axial collection. There is generalized brain atrophy greater than expected at this age. There is hypoattenuation of the periventricular white matter, most commonly indicating chronic ischemic microangiopathy. There is an old posterior left MCA territory infarct, unchanged. Vascular: No abnormal hyperdensity of the major intracranial arteries or dural venous sinuses. No intracranial  atherosclerosis. Skull: The visualized skull base, calvarium and extracranial soft tissues are normal. Sinuses/Orbits: No fluid levels or advanced mucosal thickening of the visualized paranasal sinuses. No mastoid or middle ear  effusion. The orbits are normal. ASPECTS [facility]([place] Stroke Program Early CT Score) - Ganglionic level infarction (caudate, lentiform nuclei, internal capsule, insula, M1-M3 cortex): 7 - Supraganglionic infarction (M4-M6 cortex): 3 Total score (0-10 with 10 being normal): 10 IMPRESSION: 1. No acute intracranial hemorrhage. 2. Old posterior left MCA territory infarct, chronic ischemic microangiopathy and age advanced parenchymal atrophy. 3. ASPECTS is 10. These results were communicated to Dr. Ritta SlotMcNeill Kirkpatrick at 11:00 pm on 07/08/2018 by text page via the [facility] messaging system. Electronically Signed   By: Deatra RobinsonKevin  Herman M.D.   On: 07/08/2018 23:01    PHYSICAL EXAM Pleasant middle-age Caucasian male not in distress. . Afebrile. Head is nontraumatic. Neck is supple without bruit.    Cardiac exam no murmur or gallop. Lungs are clear to auscultation. Distal pulses are well felt. Neurological Exam ;  Awake  Alert oriented x 3.  Mild expressive aphasia with nonfluent speech and word finding difficulties.  Impaired naming and repetition but good comprehension.  Follows three-step commands.  Eye movements full without nystagmus.fundi were not visualized. Vision acuity appears normal.  Mild right-sided peripheral visual field loss hearing is normal. Palatal movements are normal. Face symmetric. Tongue midline. Normal strength, tone, reflexes and coordination. Normal sensation. Gait deferred.  ASSESSMENT/PLAN [name] Cruz is a 63 y.o. male with history of hemorrhagic stroke d/t HTN in 2004 with resultant mild aphasia, Sz d/o CAD s/p CABG presenting with transient confusion, slurred speech while talking on the phone.   Confusion  Sx started after he was under the house cleaning out  algae - possibly related or unwitnessed seizure followed by postictal confusion  Stroke:  Incidental R cerebellar infarct secondary to small vessel disease source - not the source of confusion  Code Stroke CT head No acute abnormality. Old L MCA infarct. Small vessel disease. Atrophy. ASPECTS 10.     CTA head & neck no ELVO. R ICA 60% mixed density. L ICA < 50%.  MRI  Punctate R cerebellar infarct. Old L MCA infarcts. Mult scattered micro hmgs. Small vessel disease. Atrophy.   2D Echo pending  LDL 69  HgbA1c 5.6  Lovenox 40 mg sq daily for VTE prophylaxis  aspirin 81 mg daily prior to admission, now on aspirin 325 mg daily. Do not recommend addition of plavix given multiple micro hemorrhages seen on imaging. Continue aspirin alone at discharge.   Therapy recommendations:  No therapy needs  Disposition:  Return home  Seizure disorder  On home keppra 1500 mg BID and vimpat 100 mg BID- there seems to be some confusion on what dose patient is supposed to be taking per telephone records on 05/11/2018. Will need to be addressed upon dispo/follow up because pt experiences recurrent sz activity on lower doses of keppra.   Continue home dose keppra, increase vimpat PM dose to 200 mg   Hypertension  Stable . Permissive hypertension (OK if < 220/120) but gradually normalize in 5-7 days . Long-term BP goal normotensive  Hyperlipidemia  Home meds:  lipitor 80, resumed in hospital  LDL 69, goal < 70  Continue statin at discharge  Other Stroke Risk Factors  Hx ETOH use  Obesity, Body mass index is 32.25 kg/m., recommend weight loss, diet and exercise as appropriate   Hx stroke/TIA  L MCA w/ residual aphasia  Coronary artery disease hx NSTEMI, s/p CABG  Hx LUE DVT tx w/ AC x 3 mo in 08/2017  Other Active Problems  Cognitive impairment  Seizure d/o on vimpat and  keppra PTA  Hospital day # 1    He presented with transient episode of confusion of unclear etiology  possibly related to exposures to some toxic gases when cleaning up a cog pipe beneath his house.  He does have a tiny right cerebellar infarct on his MRI which is likely incidental finding.  He likely had a unwitnessed seizure with postictal confusion as EEG showing some electrographic transient seizures.  Recommend increase Keppra dose 200 mg in the morning and 200 at night and continue home dose of Keppra 500 mg daily as he had side effects on higher dose.  Continue aspirin for stroke prevention alone given history of intracerebral hemorrhage.  Discussed with Dr. Theda BelfastNishant.  Greater than 50% time during this 25-minute visit was spent in counseling and coordination of care about his cerebellar infarct and confusion and answering questions.  Follow-up as an outpatient with Shanda BumpsJessica my nurse practitioner.  Stroke team will sign off.  Kindly call for questions. Delia HeadyPramod , MD Medical Director [facility]: [facility] Pager: [phone] 07/10/2018 10:35 AM   To contact Stroke Continuity provider, please refer to [email or web address]. After hours, contact General Neurology

## 2018-07-10 NOTE — Discharge Instructions (Signed)
Stroke Prevention Some medical conditions and lifestyle choices can lead to a higher risk for a stroke. You can help to prevent a stroke by making nutrition, lifestyle, and other changes. What nutrition changes can be made?   Eat healthy foods. ? Choose foods that are high in fiber. These include:  Fresh fruits.  Fresh vegetables.  Whole grains. ? Eat at least 5 or more servings of fruits and vegetables each day. Try to fill half of your plate at each meal with fruits and vegetables. ? Choose lean protein foods. These include:  Lowfat (lean) cuts of meat.  Chicken without skin.  Fish.  Tofu.  Beans.  Nuts. ? Eat low-fat dairy products. ? Avoid foods that:  Are high in salt (sodium).  Have saturated fat.  Have trans fat.  Have cholesterol.  Are processed.  Are premade.  Follow eating guidelines as told by your doctor. These may include: ? Reducing how many calories you eat and drink each day. ? Limiting how much salt you eat or drink each day to 1,500 milligrams (mg). ? Using only healthy fats for cooking. These include:  Olive oil.  Canola oil.  Sunflower oil. ? Counting how many carbohydrates you eat and drink each day. What lifestyle changes can be made?  Try to stay at a healthy weight. Talk to your doctor about what a good weight is for you.  Get at least 30 minutes of moderate physical activity at least 5 days a week. This can include: ? Fast walking. ? Biking. ? Swimming.  Do not use any products that have nicotine or tobacco. This includes cigarettes and e-cigarettes. If you need help quitting, ask your doctor. Avoid being around tobacco smoke in general.  Limit how much alcohol you drink to no more than 1 drink a day for nonpregnant women and 2 drinks a day for men. One drink equals 12 oz of beer, 5 oz of wine, or 1 oz of hard liquor.  Do not use drugs.  Avoid taking birth control pills. Talk to your doctor about the risks of taking birth  control pills if: ? You are over 55 years old. ? You smoke. ? You get migraines. ? You have had a blood clot. What other changes can be made?  Manage your cholesterol. ? It is important to eat a healthy diet. ? If your cholesterol cannot be managed through your diet, you may also need to take medicines. Take medicines as told by your doctor.  Manage your diabetes. ? It is important to eat a healthy diet and to exercise regularly. ? If your blood sugar cannot be managed through diet and exercise, you may need to take medicines. Take medicines as told by your doctor.  Control your high blood pressure (hypertension). ? Try to keep your blood pressure below 130/80. This can help lower your risk of stroke. ? It is important to eat a healthy diet and to exercise regularly. ? If your blood pressure cannot be managed through diet and exercise, you may need to take medicines. Take medicines as told by your doctor. ? Ask your doctor if you should check your blood pressure at home. ? Have your blood pressure checked every year. Do this even if your blood pressure is normal.  Talk to your doctor about getting checked for a sleep disorder. Signs of this can include: ? Snoring a lot. ? Feeling very tired.  Take over-the-counter and prescription medicines only as told by your doctor. These may  include aspirin or blood thinners (antiplatelets or anticoagulants).  Make sure that any other medical conditions you have are managed. Where to find more information  American Stroke Association: www.strokeassociation.org  National Stroke Association: www.stroke.org Get help right away if:  You have any symptoms of stroke. "BE FAST" is an easy way to remember the main warning signs: ? B - Balance. Signs are dizziness, sudden trouble walking, or loss of balance. ? E - Eyes. Signs are trouble seeing or a sudden change in how you see. ? F - Face. Signs are sudden weakness or loss of feeling of the face,  or the face or eyelid drooping on one side. ? A - Arms. Signs are weakness or loss of feeling in an arm. This happens suddenly and usually on one side of the body. ? S - Speech. Signs are sudden trouble speaking, slurred speech, or trouble understanding what people say. ? T - Time. Time to call emergency services. Write down what time symptoms started.  You have other signs of stroke, such as: ? A sudden, very bad headache with no known cause. ? Feeling sick to your stomach (nausea). ? Throwing up (vomiting). ? Jerky movements you cannot control (seizure). These symptoms may represent a serious problem that is an emergency. Do not wait to see if the symptoms will go away. Get medical help right away. Call your local emergency services (911 in the [place] Do not drive yourself to the hospital. Summary  You can prevent a stroke by eating healthy, exercising, not smoking, drinking less alcohol, and treating other health problems, such as diabetes, high blood pressure, or high cholesterol.  Do not use any products that contain nicotine or tobacco, such as cigarettes and e-cigarettes.  Get help right away if you have any signs or symptoms of a stroke. This information is not intended to replace advice given to you by your health care provider. Make sure you discuss any questions you have with your health care provider. Document Released: 07/02/2011 Document Revised: 04/03/2016 Document Reviewed: 04/03/2016 Elsevier Interactive Patient Education  2019 [employer].    Epilepsy Epilepsy is when a person keeps having seizures. A seizure is a burst of abnormal activity in the brain. A seizure can change how you think or behave, and it can make it hard to be aware of what is happening. This condition can cause problems such as:  Falls, accidents, and injury.  Sadness (depression).  Poor memory.  Sudden unexplained death in epilepsy (SUDEP). This is rare. Its cause is not known. Most people  with epilepsy lead normal lives. What are the causes? This condition may be caused by:  A head injury.  An injury that happens at birth.  A high fever during childhood.  A stroke.  Bleeding that goes into or around the brain.  Certain medicines and drugs.  Having too little oxygen for a long period of time.  Abnormal brain development.  Certain infections.  Brain tumors.  Conditions that are passed from parent to child (are hereditary). What are the signs or symptoms? Symptoms of a seizure vary from person to person. They may include:  Jerky movements of muscles (convulsions).  Stiffening of the body.  Movements of the arms or legs that you are not able to control.  Passing out (loss of consciousness).  Breathing problems.  Sudden falls.  Confusion.  Head nodding.  Eye blinking or twitching.  Lip smacking.  Drooling.  Fast eye movements.  Grunting.  Not being able  to control when you pee or poop.  Staring.  Being hard to wake up (unresponsiveness). Some people have symptoms right before a seizure happens (aura) and right after a seizure happens. These symptoms include:  Fear or anxiety.  Feeling sick to your stomach (nauseous).  Feeling like the room is spinning (vertigo).  A feeling of having seen or heard something before (dj vu).  Odd tastes or smells.  Changes in how you see (vision), such as seeing flashing lights or spots. Symptoms that follow a seizure include:  Being confused.  Being sleepy.  Having a headache. How is this treated? Treatment can control seizures. Treatment for this condition may involve:  Taking medicines to control seizures.  Having a device (vagus nerve stimulator) put in the chest. The device sends signals to a nerve and to the brain to prevent seizures.  Brain surgery to stop seizures from happening or to reduce how often they happen.  Having blood tests often to make sure you are getting the right  amount of medicine. Once this condition has been diagnosed, it is important to start treatment as soon as possible. For some people, epilepsy goes away in time. Others will need treatment for the rest of their life. Follow these instructions at home: Medicines  Take over-the-counter and prescription medicines only as told by your doctor.  Avoid anything that may keep your medicine from working, such as alcohol. Activity  Get enough rest. Lack of sleep can make seizures more likely to occur.  Follow your doctor's advice about driving, swimming, and doing anything else that would be dangerous if you had a seizure. ? If you live in the [place] check with your local DMV (department of motor vehicles) to find out about local driving laws. Each state has rules about when you can return to driving. Teaching others Teach friends and family what to do if you have a seizure. They should:  Lay you on the ground to prevent a fall.  Cushion your head and body.  Loosen any tight clothing around your neck.  Turn you on your side.  Stay with you until you are better.  Not hold you down.  Not put anything in your mouth.  Know whether or not you need emergency care.  General instructions  Avoid anything that causes you to have seizures.  Keep a seizure diary. Write down what you remember about each seizure. Be sure to include what might have caused it.  Keep all follow-up visits as told by your doctor. This is important. Contact a doctor if:  You have a change in how often or when you have seizures.  You get an infection or start to feel sick. You may have more seizures when you are sick. Get help right away if:  A seizure does not stop after 5 minutes.  You have more than one seizure in a row, and you do not have enough time between the seizures to feel better.  A seizure makes it harder to breathe.  A seizure is different from other seizures you have had.  A seizure makes you  unable to speak or use a part of your body.  You did not wake up right after a seizure. These symptoms may be an emergency. Do not wait to see if the symptoms will go away. Get medical help right away. Call your local emergency services (911 in the [place] Do not drive yourself to the hospital. Summary  Epilepsy is when a person keeps having  seizures. A seizure is a burst of abnormal activity in the brain.  Treatment can control seizures.  Teach friends and family what to do if you have a seizure. This information is not intended to replace advice given to you by your health care provider. Make sure you discuss any questions you have with your health care provider. Document Released: 10/28/2008 Document Revised: 08/25/2017 Document Reviewed: 08/25/2017 Elsevier Patient Education  2020 [employer]

## 2018-07-10 NOTE — Plan of Care (Signed)
Min assist with ADLs mobility.

## 2018-07-10 NOTE — Discharge Summary (Signed)
Physician Discharge Summary  Cipriano MileBilly J Moncada WUJ:[id number] DOB: 08/13/1955 DOA: 07/08/2018  PCP: [name], [name], MD  Admit date: 07/08/2018 Discharge date: 07/10/2018  Admitted From: Home Disposition: Home  Recommendations for Outpatient Follow-up:  1. Follow up with PCP in 1-2 weeks 2. Follow[facility] with [facility] (Dr. Pearlean BrownieSethi)  in 6 weeks  Medication changes: Increased aspirin to 325 mg daily (was on 81 mg daily at home) Increased Vimpat dose to 100 mg in a.m. and 20 mg p.m. (was 100 mg twice daily)  Home Health: None Equipment/Devices: None  Discharge Condition: Fair CODE STATUS: Full code Diet recommendation: Heart Healthy     Discharge Diagnoses:  Principal Problem:   Cerebellar stroke, acute (HCC)   Active Problems:   Seizure disorder (HCC) Abnormal EEG   HLD (hyperlipidemia)   HTN (hypertension)   S/P CABG x 3   Postictal state [facility](HCC)  Brief narrative/HPI 63 year old male with prior stroke, seizure disorder, CAD s/p CABG brought to the ED after being found by his sister to be confused with speech being slurred on the evening of admission.  Patient reported he was working around his house (in the crawl space) and upon returning into the house family noticed him to be different.  Patient reported that he has impaired vision in right eye  from prior stroke.  In the ED vitals were stable.  COVID-19 test was negative.  CT angiogram of the head and neck without large vessel obstruction (showed 60% occlusion of the proximal right internal carotid artery).  Neuro hospitalist consulted.  As patient seemed back to his baseline function TPA was not considered.  MRI of the brain showed right cerebellar stroke.  Admitted for further management.  Principal Problem:   Acute right cerebellar infarct Suspect due to small vessel disease.  Has old left MCA infarct.  Also has multiple scattered microhemorrhages and small vessel disease with atrophy.  CT angiogram of the head and neck  shows 60% right ICA occlusion and 50% left ICA occlusion.   Patient was on baby aspirin and placed on full dose aspirin now.  Stroke team recommends against Plavix given multiple microhemorrhages.  LDL of 69 and A1c of 5.6.  Continue home dose Lipitor. Allowed permissive hypertension. No further needs per PT and OT. 2D echo with normal EF and no wall motion abnormality or cardiac source of emboli.  Acute toxic metabolic encephalopathy Patient confused on presentation which is not explained by small area of stroke.  EEG done showing cerebral dysfunction in the left posterior temporal and occipital area as well as seizure tendency mainly from the left mid to posterior temporal lobe.  Discussed with stroke team and increased Vimpat dose to 300 mg total (100 mg in a.m. and 20 mg a.m.).  Continue home dose Keppra. Patient informs that he has not been driving since his previous stroke and does not operate any heavy machinery.  He will follow-up with stroke service as outpatient for both the stroke and seizures..  Active Problems:   HLD (hyperlipidemia) Continue statin.  Uncontrolled hypertension Allowing permissive blood pressure.  Resume home meds  History of seizures As outlined above.  Acute Vimpat dose increased.  Continue home dose Keppra.  CAD with history of CABG Continue beta-blocker and statin.  Now on full dose aspirin.    Family Communication  : None at bedside  Disposition Plan  : Home     Consults  : Neurology  Procedures  : CT head, CT angiogram head and neck, MRI brain,  2D echo and EEG   Discharge Instructions   Allergies as of 07/10/2018      Reactions   Losartan Other (See Comments)   hyperkalemia      Medication List    STOP taking these medications   aspirin 81 MG EC tablet Replaced by: aspirin 325 MG tablet   ferrous sulfate 325 (65 FE) MG tablet     TAKE these medications   aspirin 325 MG tablet Take 1 tablet (325 mg total) by mouth  daily. Start taking on: July 11, 2018 Replaces: aspirin 81 MG EC tablet   atorvastatin 80 MG tablet Commonly known as: LIPITOR TAKE 1 TABLET (80 MG TOTAL) BY MOUTH DAILY. FOR CHOLESTEROL What changed:   how much to take  how to take this  when to take this  additional instructions   B-12 1000 MCG Subl Place 1 tablet under the tongue daily. What changed: how much to take   carvedilol 3.125 MG tablet Commonly known as: COREG Take 1 tablet (3.125 mg total) by mouth 2 (two) times daily.   levETIRAcetam 750 MG tablet Commonly known as: KEPPRA Take 2 tablets (1,500 mg total) by mouth 2 (two) times daily.   multivitamin with minerals Tabs tablet Take 1 tablet by mouth daily.   sertraline 25 MG tablet Commonly known as: Zoloft Take 0.5 tablets (12.5 mg total) by mouth at bedtime.   Vimpat 100 MG Tabs Generic drug: Lacosamide Take 1 tablet (100 mg total) by mouth 2 (two) times daily. TAKE 100 MG IN THE MORNING AND 200 MG IN THE EVENING ( TOTAL OF 300 MG DAILY ) What changed: additional instructions      Follow-up Information    [name], MD. Schedule an appointment as soon as possible for a visit in 1 week(s).   Specialty: Family Medicine Contact information: [address] [place] [address] [phone]        Corky Crafts, MD .   Specialties: Cardiology, Radiology, Interventional Cardiology Contact information: [address] [place] [address] [phone]        Micki Riley, MD. Call in 6 week(s).   Specialties: Neurology, Radiology Contact information: [address] [place] [address] [phone]          Allergies  Allergen Reactions  . Losartan Other (See Comments)    hyperkalemia        Procedures/Studies: Ct Angio Head W Or Wo Contrast  Result Date: 07/08/2018 CLINICAL DATA:  Slurred speech and altered mental status EXAM: CT ANGIOGRAPHY HEAD AND NECK TECHNIQUE:  Multidetector CT imaging of the head and neck was performed using the standard protocol during bolus administration of intravenous contrast. Multiplanar CT image reconstructions and MIPs were obtained to evaluate the vascular anatomy. Carotid stenosis measurements (when applicable) are obtained utilizing NASCET criteria, using the distal internal carotid diameter as the denominator. CONTRAST:  75mL OMNIPAQUE IOHEXOL 350 MG/ML SOLN COMPARISON:  Head CT 07/08/2018, 03/25/2018 FINDINGS: CTA NECK FINDINGS SKELETON: There is no bony spinal canal stenosis. No lytic or blastic lesion. OTHER NECK: Normal pharynx, larynx and major salivary glands. No cervical lymphadenopathy. Unremarkable thyroid gland. UPPER CHEST: No pneumothorax or pleural effusion. No nodules or masses. AORTIC ARCH: There is no calcific atherosclerosis of the aortic arch. There is no aneurysm, dissection or hemodynamically significant stenosis of the visualized ascending aorta and aortic arch. Normal variant aortic arch branching pattern with the left vertebral artery arising independently from the aortic arch. The  visualized proximal subclavian arteries are widely patent. RIGHT CAROTID SYSTEM: --Common carotid artery: Widely patent origin without common carotid artery dissection or aneurysm. --Internal carotid artery: No dissection, occlusion or aneurysm. There is mixed density atherosclerosis extending into the proximal ICA, resulting in 60% stenosis. --External carotid artery: No acute abnormality. LEFT CAROTID SYSTEM: --Common carotid artery: Widely patent origin without common carotid artery dissection or aneurysm. --Internal carotid artery: No dissection, occlusion or aneurysm. There is mixed density atherosclerosis extending into the proximal ICA, resulting in less than 50% stenosis. --External carotid artery: No acute abnormality. VERTEBRAL ARTERIES: Left dominant configuration. Both origins are clearly patent. No dissection, occlusion or  flow-limiting stenosis to the skull base (V1-V3 segments). CTA HEAD FINDINGS POSTERIOR CIRCULATION: --Vertebral arteries: Normal V4 segments. --Posterior inferior cerebellar arteries (PICA): Patent origins from the vertebral arteries. --Anterior inferior cerebellar arteries (AICA): Patent origins from the basilar artery. --Basilar artery: Normal. --Superior cerebellar arteries: Normal. --Posterior cerebral arteries (PCA): Normal. The right PCA is predominantly supplied by the posterior communicating artery. ANTERIOR CIRCULATION: --Intracranial internal carotid arteries: Atherosclerotic calcification of the internal carotid arteries at the skull base without hemodynamically significant stenosis. --Anterior cerebral arteries (ACA): Normal. Both A1 segments are present. Patent anterior communicating artery (a-comm). --Middle cerebral arteries (MCA): Normal. VENOUS SINUSES: As permitted by contrast timing, patent. ANATOMIC VARIANTS: Fetal right posterior cerebral artery. Review of the MIP images confirms the above findings. IMPRESSION: 1. No emergent large vessel occlusion. 2. 60% stenosis of the proximal right internal carotid artery due to mixed density atherosclerosis. 3. Left carotid bifurcation atherosclerosis with less than 50% stenosis of the proximal left internal carotid artery. Electronically Signed   By: Deatra Robinson M.D.   On: 07/08/2018 23:14   Ct Angio Neck W Or Wo Contrast  Result Date: 07/08/2018 CLINICAL DATA:  Slurred speech and altered mental status EXAM: CT ANGIOGRAPHY HEAD AND NECK TECHNIQUE: Multidetector CT imaging of the head and neck was performed using the standard protocol during bolus administration of intravenous contrast. Multiplanar CT image reconstructions and MIPs were obtained to evaluate the vascular anatomy. Carotid stenosis measurements (when applicable) are obtained utilizing NASCET criteria, using the distal internal carotid diameter as the denominator. CONTRAST:  75mL  OMNIPAQUE IOHEXOL 350 MG/ML SOLN COMPARISON:  Head CT 07/08/2018, 03/25/2018 FINDINGS: CTA NECK FINDINGS SKELETON: There is no bony spinal canal stenosis. No lytic or blastic lesion. OTHER NECK: Normal pharynx, larynx and major salivary glands. No cervical lymphadenopathy. Unremarkable thyroid gland. UPPER CHEST: No pneumothorax or pleural effusion. No nodules or masses. AORTIC ARCH: There is no calcific atherosclerosis of the aortic arch. There is no aneurysm, dissection or hemodynamically significant stenosis of the visualized ascending aorta and aortic arch. Normal variant aortic arch branching pattern with the left vertebral artery arising independently from the aortic arch. The visualized proximal subclavian arteries are widely patent. RIGHT CAROTID SYSTEM: --Common carotid artery: Widely patent origin without common carotid artery dissection or aneurysm. --Internal carotid artery: No dissection, occlusion or aneurysm. There is mixed density atherosclerosis extending into the proximal ICA, resulting in 60% stenosis. --External carotid artery: No acute abnormality. LEFT CAROTID SYSTEM: --Common carotid artery: Widely patent origin without common carotid artery dissection or aneurysm. --Internal carotid artery: No dissection, occlusion or aneurysm. There is mixed density atherosclerosis extending into the proximal ICA, resulting in less than 50% stenosis. --External carotid artery: No acute abnormality. VERTEBRAL ARTERIES: Left dominant configuration. Both origins are clearly patent. No dissection, occlusion or flow-limiting stenosis to the skull base (V1-V3 segments). CTA HEAD FINDINGS  POSTERIOR CIRCULATION: --Vertebral arteries: Normal V4 segments. --Posterior inferior cerebellar arteries (PICA): Patent origins from the vertebral arteries. --Anterior inferior cerebellar arteries (AICA): Patent origins from the basilar artery. --Basilar artery: Normal. --Superior cerebellar arteries: Normal. --Posterior cerebral  arteries (PCA): Normal. The right PCA is predominantly supplied by the posterior communicating artery. ANTERIOR CIRCULATION: --Intracranial internal carotid arteries: Atherosclerotic calcification of the internal carotid arteries at the skull base without hemodynamically significant stenosis. --Anterior cerebral arteries (ACA): Normal. Both A1 segments are present. Patent anterior communicating artery (a-comm). --Middle cerebral arteries (MCA): Normal. VENOUS SINUSES: As permitted by contrast timing, patent. ANATOMIC VARIANTS: Fetal right posterior cerebral artery. Review of the MIP images confirms the above findings. IMPRESSION: 1. No emergent large vessel occlusion. 2. 60% stenosis of the proximal right internal carotid artery due to mixed density atherosclerosis. 3. Left carotid bifurcation atherosclerosis with less than 50% stenosis of the proximal left internal carotid artery. Electronically Signed   By: Deatra Robinson M.D.   On: 07/08/2018 23:14   Mr Brain Wo Contrast  Result Date: 07/09/2018 CLINICAL DATA:  Facial droop and slurred speech EXAM: MRI HEAD WITHOUT CONTRAST TECHNIQUE: Multiplanar, multiecho pulse sequences of the brain and surrounding structures were obtained without intravenous contrast. COMPARISON:  CTA head neck 07/08/2018 FINDINGS: BRAIN: Small focus of abnormal diffusion restriction within the right cerebellum. The midline structures are normal. Early confluent hyperintense T2-weighted signal of the periventricular and deep white matter, most commonly due to chronic ischemic microangiopathy. Advanced atrophy for age. There is ballooning of the temporal horn of the left lateral ventricle secondary to encephalomalacia from associated infarct. Scattered chronic microhemmorhages in a peripheral predominant distribution. There is hemosiderin deposition at the site of the old posterior left MCA territory infarct. No midline shift or other mass effect. VASCULAR: The major intracranial arterial  and venous sinus flow voids are normal. SKULL AND UPPER CERVICAL SPINE: Calvarial bone marrow signal is normal. There is no skull base mass. Visualized upper cervical spine and soft tissues are normal. SINUSES/ORBITS: No fluid levels or advanced mucosal thickening. No mastoid or middle ear effusion. The orbits are normal. IMPRESSION: 1. Punctate acute infarct within the right cerebellar hemisphere. No acute hemorrhage or mass effect. 2. Age advanced volume loss with posterior left MCA territory encephalomalacia. 3. Multiple scattered, peripheral predominant chronic microhemorrhages. Electronically Signed   By: Deatra Robinson M.D.   On: 07/09/2018 00:57   Ct Head Code Stroke Wo Contrast  Result Date: 07/08/2018 CLINICAL DATA:  Code stroke. Slurred speech and altered mental status EXAM: CT HEAD WITHOUT CONTRAST TECHNIQUE: Contiguous axial images were obtained from the base of the skull through the vertex without intravenous contrast. COMPARISON:  03/25/2018 FINDINGS: Brain: There is no mass, hemorrhage or extra-axial collection. There is generalized brain atrophy greater than expected at this age. There is hypoattenuation of the periventricular white matter, most commonly indicating chronic ischemic microangiopathy. There is an old posterior left MCA territory infarct, unchanged. Vascular: No abnormal hyperdensity of the major intracranial arteries or dural venous sinuses. No intracranial atherosclerosis. Skull: The visualized skull base, calvarium and extracranial soft tissues are normal. Sinuses/Orbits: No fluid levels or advanced mucosal thickening of the visualized paranasal sinuses. No mastoid or middle ear effusion. The orbits are normal. ASPECTS [facility] Stroke Program Early CT Score) - Ganglionic level infarction (caudate, lentiform nuclei, internal capsule, insula, M1-M3 cortex): 7 - Supraganglionic infarction (M4-M6 cortex): 3 Total score (0-10 with 10 being normal): 10 IMPRESSION: 1. No acute  intracranial hemorrhage. 2. Old posterior left MCA  territory infarct, chronic ischemic microangiopathy and age advanced parenchymal atrophy. 3. ASPECTS is 10. These results were communicated to Dr. Ritta SlotMcNeill Kirkpatrick at 11:00 pm on 07/08/2018 by text page via the [facility] messaging system. Electronically Signed   By: Deatra RobinsonKevin  Herman M.D.   On: 07/08/2018 23:01        Subjective: Not in distress.  Appears much better oriented today.  Dysarthria at baseline.  Discharge Exam: Vitals:   07/10/18 0452 07/10/18 0500  BP: 116/77   Pulse: 62 (!) 51  Resp: 13 11  Temp:    SpO2: 97% 97%   Vitals:   07/09/18 2317 07/10/18 0328 07/10/18 0452 07/10/18 0500  BP: 107/64 104/60 116/77   Pulse:   62 (!) 51  Resp:   13 11  Temp: 98.5 F (36.9 C) 97.9 F (36.6 C)    TempSrc: Oral Oral    SpO2:   97% 97%  Weight:      Height:        General: Middle-aged male not in distress HEENT: Moist mucosa, supple neck Chest: Clear bilaterally CVs: Normal S1-S2, no murmurs GI: Soft, nondistended, nontender Musculoskeletal: Warm, no edema CNS: Alert and oriented x3, mild dysarthria, no other focal deficit    The results of significant diagnostics from this hospitalization (including imaging, microbiology, ancillary and laboratory) are listed below for reference.     Microbiology: Recent Results (from the past 240 hour(s))  SARS Coronavirus 2 (CEPHEID - Performed [facility]), Hosp Order     Status: None   Collection Time: 07/09/18  2:15 AM   Specimen: Nasopharyngeal Swab  Result Value Ref Range Status   SARS Coronavirus 2 NEGATIVE NEGATIVE Final    Comment: (NOTE) If result is NEGATIVE SARS-CoV-2 target nucleic acids are NOT DETECTED. The SARS-CoV-2 RNA is generally detectable in upper and lower  respiratory specimens during the acute phase of infection. The lowest  concentration of SARS-CoV-2 viral copies this assay can detect is 250  copies / mL. A negative result does not  preclude SARS-CoV-2 infection  and should not be used as the sole basis for treatment or other  patient management decisions.  A negative result may occur with  improper specimen collection / handling, submission of specimen other  than nasopharyngeal swab, presence of viral mutation(s) within the  areas targeted by this assay, and inadequate number of viral copies  (<250 copies / mL). A negative result must be combined with clinical  observations, patient history, and epidemiological information. If result is POSITIVE SARS-CoV-2 target nucleic acids are DETECTED. The SARS-CoV-2 RNA is generally detectable in upper and lower  respiratory specimens dur ing the acute phase of infection.  Positive  results are indicative of active infection with SARS-CoV-2.  Clinical  correlation with patient history and other diagnostic information is  necessary to determine patient infection status.  Positive results do  not rule out bacterial infection or co-infection with other viruses. If result is PRESUMPTIVE POSTIVE SARS-CoV-2 nucleic acids MAY BE PRESENT.   A presumptive positive result was obtained on the submitted specimen  and confirmed on repeat testing.  While 2019 novel coronavirus  (SARS-CoV-2) nucleic acids may be present in the submitted sample  additional confirmatory testing may be necessary for epidemiological  and / or clinical management purposes  to differentiate between  SARS-CoV-2 and other Sarbecovirus currently known to infect humans.  If clinically indicated additional testing with an alternate test  methodology [phone] is advised. The SARS-CoV-2 RNA is generally  detectable in upper and lower respiratory sp ecimens during the acute  phase of infection. The expected result is Negative. Fact Sheet for Patients:  StrictlyIdeas.no Fact Sheet for Healthcare Providers: BankingDealers.co.za This test is not yet approved or cleared by  the [place] FDA and has been authorized for detection and/or diagnosis of SARS-CoV-2 by FDA under an Emergency Use Authorization (EUA).  This EUA will remain in effect (meaning this test can be used) for the duration of the COVID-19 declaration under Section 564(b)(1) of the Act, 21 U.S.C. section 360bbb-3(b)(1), unless the authorization is terminated or revoked sooner. Performed at [facility], [place] [address], [place], [place] [address]      Labs: BNP (last 3 results) No results for input(s): BNP in the last 8760 hours. Basic Metabolic Panel: Recent Labs  Lab 07/08/18 2253 07/08/18 2314 07/09/18 0416  NA 137 135 140  K 3.4* 3.9 3.7  CL 100 101 107  CO2  --  24 25  GLUCOSE 87 105* 103*  BUN 4* 5* <5*  CREATININE 0.70 0.79 0.71  CALCIUM  --  8.9 9.1   Liver Function Tests: Recent Labs  Lab 07/08/18 2314 07/09/18 0416  AST 23 20  ALT 18 18  ALKPHOS 53 64  BILITOT 0.9 0.6  PROT 6.3* 6.3*  ALBUMIN 3.9 4.0   No results for input(s): LIPASE, AMYLASE in the last 168 hours. No results for input(s): AMMONIA in the last 168 hours. CBC: Recent Labs  Lab 07/08/18 2253 07/08/18 2314 07/09/18 0416  WBC  --  9.2 7.0  NEUTROABS  --  5.4  --   HGB 15.0 12.6* 12.6*  HCT 44.0 38.8* 37.9*  MCV  --  92.8 90.2  PLT  --  190 200   Cardiac Enzymes: No results for input(s): CKTOTAL, CKMB, CKMBINDEX, TROPONINI in the last 168 hours. BNP: Invalid input(s): POCBNP CBG: Recent Labs  Lab 07/08/18 2248  GLUCAP 85   D-Dimer No results for input(s): DDIMER in the last 72 hours. Hgb A1c Recent Labs    07/09/18 0416  HGBA1C 5.6   Lipid Profile Recent Labs    07/09/18 0416  CHOL 120  HDL 35*  LDLCALC 69  TRIG 78  CHOLHDL 3.4   Thyroid function studies No results for input(s): TSH, T4TOTAL, T3FREE, THYROIDAB in the last 72 hours.  Invalid input(s): FREET3 Anemia work up No results for input(s): VITAMINB12, FOLATE, FERRITIN, TIBC, IRON, RETICCTPCT in  the last 72 hours. Urinalysis    Component Value Date/Time   COLORURINE COLORLESS (A) 07/09/2018 0000   APPEARANCEUR CLEAR 07/09/2018 0000   LABSPEC 1.014 07/09/2018 0000   PHURINE 7.0 07/09/2018 0000   GLUCOSEU NEGATIVE 07/09/2018 0000   HGBUR NEGATIVE 07/09/2018 0000   BILIRUBINUR NEGATIVE 07/09/2018 0000   KETONESUR NEGATIVE 07/09/2018 0000   PROTEINUR NEGATIVE 07/09/2018 0000   UROBILINOGEN 0.2 04/26/2012 1900   NITRITE NEGATIVE 07/09/2018 0000   LEUKOCYTESUR NEGATIVE 07/09/2018 0000   Sepsis Labs Invalid input(s): PROCALCITONIN,  WBC,  LACTICIDVEN Microbiology Recent Results (from the past 240 hour(s))  SARS Coronavirus 2 (CEPHEID - Performed in [facility] hospital lab), Hosp Order     Status: None   Collection Time: 07/09/18  2:15 AM   Specimen: Nasopharyngeal Swab  Result Value Ref Range Status   SARS Coronavirus 2 NEGATIVE NEGATIVE Final    Comment: (NOTE) If result is NEGATIVE SARS-CoV-2 target nucleic acids are NOT DETECTED. The SARS-CoV-2 RNA is generally detectable in upper and lower  respiratory  specimens during the acute phase of infection. The lowest  concentration of SARS-CoV-2 viral copies this assay can detect is 250  copies / mL. A negative result does not preclude SARS-CoV-2 infection  and should not be used as the sole basis for treatment or other  patient management decisions.  A negative result may occur with  improper specimen collection / handling, submission of specimen other  than nasopharyngeal swab, presence of viral mutation(s) within the  areas targeted by this assay, and inadequate number of viral copies  (<250 copies / mL). A negative result must be combined with clinical  observations, patient history, and epidemiological information. If result is POSITIVE SARS-CoV-2 target nucleic acids are DETECTED. The SARS-CoV-2 RNA is generally detectable in upper and lower  respiratory specimens dur ing the acute phase of infection.  Positive   results are indicative of active infection with SARS-CoV-2.  Clinical  correlation with patient history and other diagnostic information is  necessary to determine patient infection status.  Positive results do  not rule out bacterial infection or co-infection with other viruses. If result is PRESUMPTIVE POSTIVE SARS-CoV-2 nucleic acids MAY BE PRESENT.   A presumptive positive result was obtained on the submitted specimen  and confirmed on repeat testing.  While 2019 novel coronavirus  (SARS-CoV-2) nucleic acids may be present in the submitted sample  additional confirmatory testing may be necessary for epidemiological  and / or clinical management purposes  to differentiate between  SARS-CoV-2 and other Sarbecovirus currently known to infect humans.  If clinically indicated additional testing with an alternate test  methodology [phone]([id number]) is advised. The SARS-CoV-2 RNA is generally  detectable in upper and lower respiratory sp ecimens during the acute  phase of infection. The expected result is Negative. Fact Sheet for Patients:  BoilerBrush.com.cyhttps://www.fda.gov/media/136312/download Fact Sheet for Healthcare Providers: https://pope.com/https://www.fda.gov/media/136313/download This test is not yet approved or cleared by the [place] FDA and has been authorized for detection and/or diagnosis of SARS-CoV-2 by FDA under an Emergency Use Authorization (EUA).  This EUA will remain in effect (meaning this test can be used) for the duration of the COVID-19 declaration under Section 564(b)(1) of the Act, 21 U.S.C. section 360bbb-3(b)(1), unless the authorization is terminated or revoked sooner. Performed at [facility], [address] [place], [place] [address]      Time coordinating discharge: 35 minutes  SIGNED:   Eddie NorthNishant Tayla Panozzo, MD  [facility] 07/10/2018, 11:42 AM Pager   If 7PM-7AM, please contact night-coverage [email or web address] Password [email or web address]

## 2018-07-12 ENCOUNTER — Other Ambulatory Visit: Payer: Self-pay | Admitting: Family Medicine

## 2018-07-14 ENCOUNTER — Encounter: Payer: Self-pay | Admitting: Family Medicine

## 2018-07-14 ENCOUNTER — Other Ambulatory Visit: Payer: Self-pay

## 2018-07-14 ENCOUNTER — Ambulatory Visit (INDEPENDENT_AMBULATORY_CARE_PROVIDER_SITE_OTHER): Payer: Medicare Other | Admitting: Family Medicine

## 2018-07-14 VITALS — BP 123/76 | HR 62 | Ht 63.0 in

## 2018-07-14 DIAGNOSIS — I6930 Unspecified sequelae of cerebral infarction: Secondary | ICD-10-CM

## 2018-07-14 DIAGNOSIS — R4189 Other symptoms and signs involving cognitive functions and awareness: Secondary | ICD-10-CM | POA: Diagnosis not present

## 2018-07-14 DIAGNOSIS — G40909 Epilepsy, unspecified, not intractable, without status epilepticus: Secondary | ICD-10-CM

## 2018-07-14 DIAGNOSIS — I639 Cerebral infarction, unspecified: Secondary | ICD-10-CM

## 2018-07-14 MED ORDER — VIMPAT 100 MG PO TABS: ORAL_TABLET | ORAL | 0 refills | Status: DC

## 2018-07-14 NOTE — Progress Notes (Signed)
[name] - 63 y.o. male  MRN [id number]  Date of Birth: 05-11-1955  PCP: Ria Bush, MD  This service was provided via telemedicine. Phone Visit performed on 07/14/2018    Rationale for phone visit along with limitations reviewed. Patient consented to telephone encounter.    Location of patient: at home Location of provider: in office, [place] [facility] Name of referring provider: N/A   Names of persons and role in encounter: Provider: Ria Bush, MD  Patient: [name] Cruz  Other: sister Diane   Time on call: 4:53pm - 5:07pm   Subjective: Chief Complaint  Patient presents with  . Hospitalization Follow-up     HPI:  Recent hospitalization for AMS found acute cerebellar stroke. tPA was not administered as he seemed back to baseline. ASA was increased from 81mg  to 325mg . There was some concern that AMS was not attributable to small cerebellar stroke, EEG showed L posterior temporal and occipital cerebral dysfunction with seizure tendency mainly from L mid to posterior temporal lobes - stroke team recommended increase in vimpat to 100mg  in am and 200mg  in pm, with continued keppra.   Since home, feeling well.  Had not started aspirin 325mg  yet - has been taking 81mg  dose. Sister will start 325mg  tomorrow.   Known prior L MCA infarct and multiple scattered microhemorrhages - plavix was not recommended due to microhemorrhages.   Admit date: 07/08/2018 Discharge date: 07/10/2018  Admitted From: Home Disposition: Home  Recommendations for Outpatient Follow-up:  1. Follow up with PCP in 1-2 weeks 2. Follow-up with [facility] neurology (Dr. Leonie Man)  in 6 weeks  Medication changes: Increased aspirin to 325 mg daily (was on 81 mg daily at home) Increased Vimpat dose to 100 mg in a.m. and 200 mg p.m. (was 100 mg twice daily)  Home Health: None Equipment/Devices: None Home Health: None Equipment/Devices: None  Discharge Condition: Fair CODE  STATUS: Full code Diet recommendation: Heart Healthy   Discharge Diagnoses:  Principal Problem:   Cerebellar stroke, acute ([place])  Active Problems:   Seizure disorder (HCC) Abnormal EEG   HLD (hyperlipidemia)   HTN (hypertension)   S/P CABG x 3   Postictal state ([place])  Objective/Observations:  No physical exam or vital signs collected unless specifically identified below.   BP 123/76   Pulse 62   Ht 5\' 3"  (1.6 m)   BMI 29.25 kg/m    Respiratory status: speaks in complete sentences without evident shortness of breath.   Assessment/Plan:  Cerebellar stroke, acute ([place]) Overall seems back to baseline. Will start higher aspirin dose tomorrow.   Seizure disorder ([place]) ?AMS due to cerebral dysfunction with residual subclinical seizures - vimpat dose has been increased to 300mg  total daily - reviewed with sister. Continue keppra 1500mg  BID. I have refilled one 45mo supply of vimpat as sister says she is running low on this. Will have future refills come from neurology.   Cognitive impairment I spoke with both pt and his sister reviewing recent hospital med changes.    I discussed the assessment and treatment plan with the patient. The patient was provided an opportunity to ask questions and all were answered. The patient agreed with the plan and demonstrated an understanding of the instructions.  Lab Orders  No laboratory test(s) ordered today    Meds ordered this encounter  Medications  . Lacosamide (VIMPAT) 100 MG TABS    Sig: TAKE 100 MG IN THE MORNING AND 200 MG IN THE EVENING ( TOTAL OF  300 MG DAILY )    Dispense:  270 tablet    Refill:  0    Future refills through neurology, note new sig    The patient was advised to call back or seek an in-person evaluation if the symptoms worsen or if the condition fails to improve as anticipated.  Eustaquio BoydenJavier Kenyonna Micek, MD

## 2018-07-14 NOTE — Assessment & Plan Note (Addendum)
Overall seems back to baseline. Will start higher aspirin dose tomorrow.

## 2018-07-14 NOTE — Assessment & Plan Note (Signed)
I spoke with both pt and his sister reviewing recent hospital med changes.

## 2018-07-14 NOTE — Assessment & Plan Note (Addendum)
?  AMS due to cerebral dysfunction with residual subclinical seizures - vimpat dose has been increased to 300mg  total daily - reviewed with sister. Continue keppra 1500mg  BID. I have refilled one 22mo supply of vimpat as sister says she is running low on this. Will have future refills come from neurology.

## 2018-07-14 NOTE — Patient Outreach (Signed)
Left msg regarding [facility] care mgmt program.

## 2018-07-16 ENCOUNTER — Ambulatory Visit: Payer: Medicare Other | Admitting: Adult Health

## 2018-07-21 ENCOUNTER — Other Ambulatory Visit (INDEPENDENT_AMBULATORY_CARE_PROVIDER_SITE_OTHER): Payer: Medicare Other

## 2018-07-21 DIAGNOSIS — Z1211 Encounter for screening for malignant neoplasm of colon: Secondary | ICD-10-CM

## 2018-07-21 LAB — FECAL OCCULT BLOOD, IMMUNOCHEMICAL: Fecal Occult Bld: NEGATIVE

## 2018-07-21 LAB — FECAL OCCULT BLOOD, GUAIAC: Fecal Occult Blood: NEGATIVE

## 2018-07-22 ENCOUNTER — Encounter: Payer: Self-pay | Admitting: Family Medicine

## 2018-07-28 ENCOUNTER — Ambulatory Visit: Payer: Self-pay | Admitting: Adult Health

## 2018-08-07 ENCOUNTER — Other Ambulatory Visit: Payer: Self-pay | Admitting: Family Medicine

## 2018-08-07 ENCOUNTER — Emergency Department (HOSPITAL_COMMUNITY): Payer: Medicare Other

## 2018-08-07 ENCOUNTER — Inpatient Hospital Stay (HOSPITAL_COMMUNITY)
Admission: EM | Admit: 2018-08-07 | Discharge: 2018-08-08 | DRG: 066 | Disposition: A | Discharge status: Home / Self Care | Payer: Medicare Other | Attending: Internal Medicine | Admitting: Internal Medicine

## 2018-08-07 ENCOUNTER — Encounter (HOSPITAL_COMMUNITY): Payer: Self-pay

## 2018-08-07 ENCOUNTER — Other Ambulatory Visit: Payer: Self-pay

## 2018-08-07 DIAGNOSIS — I6782 Cerebral ischemia: Secondary | ICD-10-CM | POA: Diagnosis not present

## 2018-08-07 DIAGNOSIS — R29810 Facial weakness: Secondary | ICD-10-CM | POA: Diagnosis not present

## 2018-08-07 DIAGNOSIS — Z820 Family history of epilepsy and other diseases of the nervous system: Secondary | ICD-10-CM | POA: Diagnosis not present

## 2018-08-07 DIAGNOSIS — Z7982 Long term (current) use of aspirin: Secondary | ICD-10-CM

## 2018-08-07 DIAGNOSIS — I480 Paroxysmal atrial fibrillation: Secondary | ICD-10-CM | POA: Diagnosis present

## 2018-08-07 DIAGNOSIS — H748X2 Other specified disorders of left middle ear and mastoid: Secondary | ICD-10-CM | POA: Diagnosis not present

## 2018-08-07 DIAGNOSIS — I252 Old myocardial infarction: Secondary | ICD-10-CM

## 2018-08-07 DIAGNOSIS — Z8674 Personal history of sudden cardiac arrest: Secondary | ICD-10-CM

## 2018-08-07 DIAGNOSIS — R4701 Aphasia: Secondary | ICD-10-CM | POA: Diagnosis not present

## 2018-08-07 DIAGNOSIS — I10 Essential (primary) hypertension: Secondary | ICD-10-CM | POA: Diagnosis not present

## 2018-08-07 DIAGNOSIS — Z79899 Other long term (current) drug therapy: Secondary | ICD-10-CM | POA: Diagnosis not present

## 2018-08-07 DIAGNOSIS — Z951 Presence of aortocoronary bypass graft: Secondary | ICD-10-CM | POA: Diagnosis not present

## 2018-08-07 DIAGNOSIS — R4781 Slurred speech: Secondary | ICD-10-CM | POA: Diagnosis not present

## 2018-08-07 DIAGNOSIS — R569 Unspecified convulsions: Secondary | ICD-10-CM

## 2018-08-07 DIAGNOSIS — I629 Nontraumatic intracranial hemorrhage, unspecified: Secondary | ICD-10-CM | POA: Diagnosis not present

## 2018-08-07 DIAGNOSIS — R29705 NIHSS score 5: Secondary | ICD-10-CM | POA: Diagnosis not present

## 2018-08-07 DIAGNOSIS — G40909 Epilepsy, unspecified, not intractable, without status epilepticus: Secondary | ICD-10-CM

## 2018-08-07 DIAGNOSIS — R29818 Other symptoms and signs involving the nervous system: Secondary | ICD-10-CM | POA: Diagnosis not present

## 2018-08-07 DIAGNOSIS — E782 Mixed hyperlipidemia: Secondary | ICD-10-CM | POA: Diagnosis not present

## 2018-08-07 DIAGNOSIS — I639 Cerebral infarction, unspecified: Secondary | ICD-10-CM | POA: Diagnosis not present

## 2018-08-07 DIAGNOSIS — Z888 Allergy status to other drugs, medicaments and biological substances status: Secondary | ICD-10-CM

## 2018-08-07 DIAGNOSIS — Z87891 Personal history of nicotine dependence: Secondary | ICD-10-CM | POA: Diagnosis not present

## 2018-08-07 DIAGNOSIS — Z86718 Personal history of other venous thrombosis and embolism: Secondary | ICD-10-CM | POA: Diagnosis not present

## 2018-08-07 DIAGNOSIS — E785 Hyperlipidemia, unspecified: Secondary | ICD-10-CM | POA: Diagnosis present

## 2018-08-07 DIAGNOSIS — R404 Transient alteration of awareness: Secondary | ICD-10-CM | POA: Diagnosis not present

## 2018-08-07 DIAGNOSIS — Z20828 Contact with and (suspected) exposure to other viral communicable diseases: Secondary | ICD-10-CM | POA: Diagnosis present

## 2018-08-07 DIAGNOSIS — R52 Pain, unspecified: Secondary | ICD-10-CM | POA: Diagnosis not present

## 2018-08-07 DIAGNOSIS — Z03818 Encounter for observation for suspected exposure to other biological agents ruled out: Secondary | ICD-10-CM | POA: Diagnosis not present

## 2018-08-07 DIAGNOSIS — F329 Major depressive disorder, single episode, unspecified: Secondary | ICD-10-CM | POA: Diagnosis present

## 2018-08-07 DIAGNOSIS — I69319 Unspecified symptoms and signs involving cognitive functions following cerebral infarction: Secondary | ICD-10-CM | POA: Diagnosis not present

## 2018-08-07 DIAGNOSIS — R9082 White matter disease, unspecified: Secondary | ICD-10-CM | POA: Diagnosis not present

## 2018-08-07 DIAGNOSIS — I499 Cardiac arrhythmia, unspecified: Secondary | ICD-10-CM | POA: Diagnosis not present

## 2018-08-07 DIAGNOSIS — I2510 Atherosclerotic heart disease of native coronary artery without angina pectoris: Secondary | ICD-10-CM | POA: Diagnosis not present

## 2018-08-07 DIAGNOSIS — Z811 Family history of alcohol abuse and dependence: Secondary | ICD-10-CM

## 2018-08-07 LAB — URINALYSIS, ROUTINE W REFLEX MICROSCOPIC
Bilirubin Urine: NEGATIVE
Glucose, UA: NEGATIVE mg/dL
Hgb urine dipstick: NEGATIVE
Ketones, ur: NEGATIVE mg/dL
Leukocytes,Ua: NEGATIVE
Nitrite: NEGATIVE
Protein, ur: NEGATIVE mg/dL
Specific Gravity, Urine: 1.01 (ref 1.005–1.030)
pH: 7 (ref 5.0–8.0)

## 2018-08-07 LAB — I-STAT CHEM 8, ED
BUN: 5 mg/dL — ABNORMAL LOW (ref 8–23)
Calcium, Ion: 1.06 mmol/L — ABNORMAL LOW (ref 1.15–1.40)
Chloride: 101 mmol/L (ref 98–111)
Creatinine, Ser: 0.7 mg/dL (ref 0.61–1.24)
Glucose, Bld: 97 mg/dL (ref 70–99)
HCT: 37 % — ABNORMAL LOW (ref 39.0–52.0)
Hemoglobin: 12.6 g/dL — ABNORMAL LOW (ref 13.0–17.0)
Potassium: 4.3 mmol/L (ref 3.5–5.1)
Sodium: 138 mmol/L (ref 135–145)
TCO2: 28 mmol/L (ref 22–32)

## 2018-08-07 LAB — CBC
HCT: 38.6 % — ABNORMAL LOW (ref 39.0–52.0)
Hemoglobin: 12.6 g/dL — ABNORMAL LOW (ref 13.0–17.0)
MCH: 30.6 pg (ref 26.0–34.0)
MCHC: 32.6 g/dL (ref 30.0–36.0)
MCV: 93.7 fL (ref 80.0–100.0)
Platelets: 187 10*3/uL (ref 150–400)
RBC: 4.12 MIL/uL — ABNORMAL LOW (ref 4.22–5.81)
RDW: 12.1 % (ref 11.5–15.5)
WBC: 6.3 10*3/uL (ref 4.0–10.5)
nRBC: 0 % (ref 0.0–0.2)

## 2018-08-07 LAB — DIFFERENTIAL
Abs Immature Granulocytes: 0.02 10*3/uL (ref 0.00–0.07)
Basophils Absolute: 0.1 10*3/uL (ref 0.0–0.1)
Basophils Relative: 1 %
Eosinophils Absolute: 0.1 10*3/uL (ref 0.0–0.5)
Eosinophils Relative: 1 %
Immature Granulocytes: 0 %
Lymphocytes Relative: 26 %
Lymphs Abs: 1.6 10*3/uL (ref 0.7–4.0)
Monocytes Absolute: 0.5 10*3/uL (ref 0.1–1.0)
Monocytes Relative: 8 %
Neutro Abs: 4 10*3/uL (ref 1.7–7.7)
Neutrophils Relative %: 64 %

## 2018-08-07 LAB — COMPREHENSIVE METABOLIC PANEL
ALT: 22 U/L (ref 0–44)
AST: 18 U/L (ref 15–41)
Albumin: 3.7 g/dL (ref 3.5–5.0)
Alkaline Phosphatase: 51 U/L (ref 38–126)
Anion gap: 8 (ref 5–15)
BUN: <5 mg/dL — ABNORMAL LOW (ref 8–23)
CO2: 27 mmol/L (ref 22–32)
Calcium: 8.7 mg/dL — ABNORMAL LOW (ref 8.9–10.3)
Chloride: 104 mmol/L (ref 98–111)
Creatinine, Ser: 0.74 mg/dL (ref 0.61–1.24)
GFR calc Af Amer: >60 mL/min (ref >60)
GFR calc non Af Amer: >60 mL/min (ref >60)
Glucose, Bld: 101 mg/dL — ABNORMAL HIGH (ref 70–99)
Potassium: 4.4 mmol/L (ref 3.5–5.1)
Sodium: 139 mmol/L (ref 135–145)
Total Bilirubin: 0.5 mg/dL (ref 0.3–1.2)
Total Protein: 6.3 g/dL — ABNORMAL LOW (ref 6.5–8.1)

## 2018-08-07 LAB — RAPID URINE DRUG SCREEN, HOSP PERFORMED
Amphetamines: NOT DETECTED
Barbiturates: NOT DETECTED
Benzodiazepines: NOT DETECTED
Cocaine: NOT DETECTED
Opiates: NOT DETECTED
Tetrahydrocannabinol: NOT DETECTED

## 2018-08-07 LAB — PROTIME-INR
INR: 1.1 (ref 0.8–1.2)
Prothrombin Time: 14.4 seconds (ref 11.4–15.2)

## 2018-08-07 LAB — ETHANOL: Alcohol, Ethyl (B): <10 mg/dL (ref <10)

## 2018-08-07 LAB — CBG MONITORING, ED: Glucose-Capillary: 80 mg/dL (ref 70–99)

## 2018-08-07 LAB — APTT: aPTT: 30 seconds (ref 24–36)

## 2018-08-07 LAB — SARS CORONAVIRUS 2 BY RT PCR (HOSPITAL ORDER, PERFORMED IN ~~LOC~~ HOSPITAL LAB): SARS Coronavirus 2: NEGATIVE

## 2018-08-07 MED ORDER — ACETAMINOPHEN 160 MG/5ML PO SOLN: 650.0000 mg | ORAL | Status: DC | PRN

## 2018-08-07 MED ORDER — B-12 1000 MCG SL SUBL
1000.0000 ug | SUBLINGUAL_TABLET | Freq: Every day | SUBLINGUAL | Status: DC
  Administered 2018-08-07: 1000 ug via SUBLINGUAL

## 2018-08-07 MED ORDER — SERTRALINE HCL 25 MG PO TABS
12.5000 mg | ORAL_TABLET | Freq: Every day | ORAL | Status: DC
  Administered 2018-08-07: 12.5 mg via ORAL
  Filled 2018-08-07 (×2): qty 0.5

## 2018-08-07 MED ORDER — VITAMIN B-12 1000 MCG PO TABS
1000.0000 ug | ORAL_TABLET | Freq: Every day | ORAL | Status: DC
  Administered 2018-08-07 – 2018-08-08 (×3): 1000 ug via ORAL
  Filled 2018-08-07 (×2): qty 1

## 2018-08-07 MED ORDER — CARVEDILOL 3.125 MG PO TABS: 3.1250 mg | ORAL_TABLET | Freq: Two times a day (BID) | ORAL | 3 refills | Status: DC

## 2018-08-07 MED ORDER — ACETAMINOPHEN 325 MG PO TABS: 650.0000 mg | ORAL_TABLET | ORAL | Status: DC | PRN

## 2018-08-07 MED ORDER — SENNOSIDES-DOCUSATE SODIUM 8.6-50 MG PO TABS: 1.0000 | ORAL_TABLET | Freq: Every evening | ORAL | Status: DC | PRN

## 2018-08-07 MED ORDER — ATORVASTATIN CALCIUM 80 MG PO TABS
80.0000 mg | ORAL_TABLET | Freq: Every day | ORAL | Status: DC
  Administered 2018-08-07: 80 mg via ORAL
  Filled 2018-08-07: qty 1

## 2018-08-07 MED ORDER — HEPARIN SODIUM (PORCINE) 5000 UNIT/ML IJ SOLN
5000.0000 [IU] | Freq: Three times a day (TID) | INTRAMUSCULAR | Status: DC
  Administered 2018-08-07 – 2018-08-08 (×2): 5000 [IU] via SUBCUTANEOUS
  Filled 2018-08-07 (×2): qty 1

## 2018-08-07 MED ORDER — LACOSAMIDE 50 MG PO TABS
100.0000 mg | ORAL_TABLET | Freq: Every day | ORAL | Status: DC
  Administered 2018-08-08: 100 mg via ORAL
  Filled 2018-08-07: qty 2

## 2018-08-07 MED ORDER — ATORVASTATIN CALCIUM 80 MG PO TABS: 80.0000 mg | ORAL_TABLET | Freq: Every day | ORAL | 3 refills | Status: DC

## 2018-08-07 MED ORDER — LACOSAMIDE 200 MG PO TABS
200.0000 mg | ORAL_TABLET | Freq: Every evening | ORAL | Status: DC
  Administered 2018-08-07: 200 mg via ORAL
  Filled 2018-08-07: qty 1

## 2018-08-07 MED ORDER — LEVETIRACETAM 750 MG PO TABS
1500.0000 mg | ORAL_TABLET | Freq: Two times a day (BID) | ORAL | Status: DC
  Administered 2018-08-07 – 2018-08-08 (×2): 1500 mg via ORAL
  Filled 2018-08-07 (×2): qty 2

## 2018-08-07 MED ORDER — ASPIRIN 325 MG PO TABS
325.0000 mg | ORAL_TABLET | Freq: Every day | ORAL | Status: DC
  Administered 2018-08-08: 325 mg via ORAL
  Filled 2018-08-07: qty 1

## 2018-08-07 MED ORDER — STROKE: EARLY STAGES OF RECOVERY BOOK
Freq: Once | Status: AC
  Administered 2018-08-07: 22:00:00
  Filled 2018-08-07: qty 1

## 2018-08-07 MED ORDER — ADULT MULTIVITAMIN W/MINERALS CH
1.0000 | ORAL_TABLET | Freq: Every day | ORAL | Status: DC
  Administered 2018-08-08: 1 via ORAL
  Filled 2018-08-07: qty 1

## 2018-08-07 MED ORDER — ASPIRIN 81 MG PO CHEW
324.0000 mg | CHEWABLE_TABLET | Freq: Once | ORAL | Status: AC
  Administered 2018-08-07: 324 mg via ORAL
  Filled 2018-08-07: qty 4

## 2018-08-07 MED ORDER — ACETAMINOPHEN 650 MG RE SUPP: 650.0000 mg | RECTAL | Status: DC | PRN

## 2018-08-07 NOTE — ED Provider Notes (Signed)
[facility] Provider Note   CSN: [id number] Arrival date & time: 08/07/18  1151     History   Chief Complaint Chief Complaint  Patient presents with  . Code Stroke    HPI Cipriano MileBilly J Grunert is a 63 y.o. male.     HPI   63 year old male made a code stroke.  Past history of multiple CVAs with residual deficits and a seizure disorder.  Patient was a passenger in the backseat of a vehicle when symptoms began.  Seem to be a phasic and have a facial droop.  Staring blankly and not responding.  Of note he has word finding difficulty at baseline and will often gesticulate with his hands to aid in communication.  By the time EMS arrived family reported that he is actually close to his baseline.  Patient is unclear the exact events.  Remembers going out to eat and then coming to the hospital.  He cannot remember the timeline in between.  He has no acute complaints currently.  Past Medical History:  Diagnosis Date  . Acute deep vein thrombosis (DVT) of left upper extremity (HCC)    L brachial and basilic veins, eliquis started 08/22/2017 to continue for 3 months total   . Cognitive impairment 2007   after stroke, saw rehab but told to stop because was too upsetting to him  . History of chicken pox   . HLD (hyperlipidemia)   . HTN (hypertension)   . NSTEMI (non-ST elevated myocardial infarction) (HCC) 02/21/2017  . Obesity   . Stroke, hemorrhagic (HCC) 2007   thought 2/2 HTN (240sbp); residual cognitive impairment, loss of R peripheral field, no driving    Patient Active Problem List   Diagnosis Date Noted  . Cerebellar stroke, acute (HCC) 07/10/2018  . Status epilepticus (HCC) 03/25/2018  . Hyperkalemia 10/03/2017  . Unilateral occipital headache 10/03/2017  . Ischemic stroke (HCC) 08/23/2017  . Coronary artery disease involving coronary bypass graft of native heart without angina pectoris   . Atrial fibrillation (HCC)   . Dysphagia, post-stroke    . Cardiac arrest (HCC) 08/09/2017  . S/P CABG x 3 02/24/2017  . NSTEMI (non-ST elevated myocardial infarction) (HCC) 02/21/2017  . Encephalomalacia 10/28/2012  . Seizure disorder (HCC) 04/26/2012  . Alcohol abuse 04/26/2012  . History of stroke with current residual effects   . Cognitive impairment   . HLD (hyperlipidemia)   . HTN (hypertension)     Past Surgical History:  Procedure Laterality Date  . ANKLE SURGERY  1990s   right foot with plate and screws  . CORONARY ARTERY BYPASS GRAFT N/A 02/24/2017   3v Procedure: CORONARY ARTERY BYPASS GRAFTING (CABG) x 3 ON PUMP USING LEFT INTERNAL MAMMARY ARTERY TO LEFT ANTERIOR DESENDING CORNARY ARTERY, RIGHT GREATER SAPHENOUS VEIN TO LEFT CIRCUMFLEX ARTERY AND POSTERIOR DESENDING ARTERY. RIGHT GREATER SAPHENOUS VEIN OBTAINED VIA ENDOVEIN HARVEST.;  Surgeon: Delight OvensGerhardt, Edward B, MD  . IABP INSERTION N/A 02/21/2017   Procedure: IABP Insertion;  Surgeon: Yvonne KendallEnd, Christopher, MD;  Location: [facility] INVASIVE CV LAB;  Service: Cardiovascular;  Laterality: N/A;  . LEFT HEART CATH AND CORONARY ANGIOGRAPHY N/A 02/21/2017   Procedure: LEFT HEART CATH AND CORONARY ANGIOGRAPHY;  Surgeon: Yvonne KendallEnd, Christopher, MD;  Location: [facility] INVASIVE CV LAB;  Service: Cardiovascular;  Laterality: N/A;  . TEE WITHOUT CARDIOVERSION N/A 02/24/2017   Procedure: TRANSESOPHAGEAL ECHOCARDIOGRAM (TEE);  Surgeon: Delight OvensGerhardt, Edward B, MD;  [facility];  Service: Open Heart Surgery;  Laterality: N/A;  Home Medications    Prior to Admission medications   Medication Sig Start Date End Date Taking? Authorizing Provider  aspirin 325 MG tablet Take 1 tablet (325 mg total) by mouth daily. 07/11/18  Yes Dhungel, Nishant, MD  atorvastatin (LIPITOR) 80 MG tablet TAKE 1 TABLET (80 MG TOTAL) BY MOUTH DAILY. FOR CHOLESTEROL Patient taking differently: Take 80 mg by mouth daily.  05/11/18  Yes Jettie Booze, MD  carvedilol (COREG) 3.125 MG tablet Take 1 tablet (3.125 mg total) by mouth 2  (two) times daily. 12/09/17  Yes Jettie Booze, MD  Cyanocobalamin (B-12) 1000 MCG SUBL Place 1 tablet under the tongue daily. Patient taking differently: Place 1,000 mcg under the tongue daily.  12/26/17  Yes Ria Bush, MD  Lacosamide (VIMPAT) 100 MG TABS TAKE 100 MG IN THE MORNING AND 200 MG IN THE EVENING ( TOTAL OF 300 MG DAILY ) 07/14/18  Yes Ria Bush, MD  levETIRAcetam (KEPPRA) 750 MG tablet Take 2 tablets (1,500 mg total) by mouth 2 (two) times daily. 05/11/18  Yes Venancio Poisson, NP  Multiple Vitamin (MULTIVITAMIN WITH MINERALS) TABS tablet Take 1 tablet by mouth daily. 12/26/17  Yes Ria Bush, MD  sertraline (ZOLOFT) 25 MG tablet TAKE ONE-HALF TABLET BY  MOUTH AT BEDTIME 07/13/18  Yes Ria Bush, MD    Family History Family History  Problem Relation Age of Onset  . Alzheimer's disease Maternal Grandfather   . Cancer Mother        lymphoma  . Alcohol abuse Father        smoker  . Coronary artery disease Neg Hx   . Stroke Neg Hx   . Diabetes Neg Hx     Social History Social History   Tobacco Use  . Smoking status: Never Smoker  . Smokeless tobacco: Former Network engineer Use Topics  . Alcohol use: Not Currently  . Drug use: No     Allergies   Losartan   Review of Systems Review of Systems  Level 5 caveat because of significant communication difficulty. Physical Exam Updated Vital Signs BP 106/67 (BP Location: Right Arm)   Pulse 71   Temp 98.5 F (36.9 C) (Oral)   Resp 20   Ht 5\' 9"  (1.753 m)   Wt 79.4 kg   SpO2 100%   BMI 25.85 kg/m   Physical Exam Vitals signs and nursing note reviewed.  Constitutional:      General: He is not in acute distress.    Appearance: He is well-developed.  HENT:     Head: Normocephalic and atraumatic.  Eyes:     General:        Right eye: No discharge.        Left eye: No discharge.     Conjunctiva/sclera: Conjunctivae normal.  Neck:     Musculoskeletal: Neck supple.   Cardiovascular:     Rate and Rhythm: Normal rate and regular rhythm.     Heart sounds: Normal heart sounds. No murmur. No friction rub. No gallop.   Pulmonary:     Effort: Pulmonary effort is normal. No respiratory distress.     Breath sounds: Normal breath sounds.  Abdominal:     General: There is no distension.     Palpations: Abdomen is soft.     Tenderness: There is no abdominal tenderness.  Musculoskeletal:        General: No tenderness.  Skin:    General: Skin is warm and dry.  Neurological:  Mental Status: He is alert.     Comments: To self.  Speech is dysarthric.  Cranial nerves II through XII appear to be intact.  Strength is 5/5 bilateral upper and lower extremities.  Psychiatric:        Behavior: Behavior normal.        Thought Content: Thought content normal.      ED Treatments / Results  Labs (all labs ordered are listed, but only abnormal results are displayed) Labs Reviewed  CBC - Abnormal; Notable for the following components:      Result Value   RBC 4.12 (*)    Hemoglobin 12.6 (*)    HCT 38.6 (*)    All other components within normal limits  COMPREHENSIVE METABOLIC PANEL - Abnormal; Notable for the following components:   Glucose, Bld 101 (*)    BUN <5 (*)    Calcium 8.7 (*)    Total Protein 6.3 (*)    All other components within normal limits  I-STAT CHEM 8, ED - Abnormal; Notable for the following components:   BUN 5 (*)    Calcium, Ion 1.06 (*)    Hemoglobin 12.6 (*)    HCT 37.0 (*)    All other components within normal limits  ETHANOL  PROTIME-INR  APTT  DIFFERENTIAL  RAPID URINE DRUG SCREEN, HOSP PERFORMED  URINALYSIS, ROUTINE W REFLEX MICROSCOPIC  CBG MONITORING, ED    EKG None  Radiology Ct Head Code Stroke Wo Contrast  Result Date: 08/07/2018 CLINICAL DATA:  Code stroke.  Aphasia. EXAM: CT HEAD WITHOUT CONTRAST TECHNIQUE: Contiguous axial images were obtained from the base of the skull through the vertex without intravenous  contrast. COMPARISON:  Head CT 07/08/2018 and MRI 07/09/2018 FINDINGS: Brain: There is no evidence of acute infarct, intracranial hemorrhage, mass, midline shift, or extra-axial fluid collection. Left temporoparietal encephalomalacia is again noted with associated calcification and ex vacuo dilatation of the left lateral ventricle. Patchy hypodensities elsewhere in the cerebral white matter bilaterally are unchanged and nonspecific but compatible with moderate chronic small vessel ischemic disease. Vascular: Calcified atherosclerosis at the skull base. No hyperdense vessel. Skull: No fracture or focal osseous lesion. Sinuses/Orbits: Chronic left mastoid effusion. Clear paranasal sinuses. Unremarkable orbits. Other: None. ASPECTS [facility] Stroke Program Early CT Score) - Ganglionic level infarction (caudate, lentiform nuclei, internal capsule, insula, M1-M3 cortex): 7 - Supraganglionic infarction (M4-M6 cortex): 3 Total score (0-10 with 10 being normal): 10 IMPRESSION: 1. No evidence of acute intracranial abnormality. 2. ASPECTS is 10. 3. Chronic ischemia and encephalomalacia as above. These results were communicated to Dr. Laurence SlateAroor at 12:15 pm on 08/07/2018 by text page via the [facility] messaging system. Electronically Signed   By: Sebastian AcheAllen  Grady M.D.   On: 08/07/2018 12:16    Procedures Procedures (including critical care time)  Medications Ordered in ED Medications - No data to display   Initial Impression / Assessment and Plan / ED Course  I have reviewed the triage vital signs and the nursing notes.  Pertinent labs & imaging results that were available during my care of the patient were reviewed by me and considered in my medical decision making (see chart for details).        63 year old male with new neurologic symptoms but now back to baseline.  Evaluated by neurology.  Felt that this may have actually been a seizure.  History of the same.  Sister reports that he is compliant with his medications.   CT the head without acute abnormality.  Plan  is for MRI.  If this does not show any acute abnormality that needs to be emergently addressed then plan is for outpatient follow-up with his neurologist.  Final Clinical Impressions(s) / ED Diagnoses   Final diagnoses:  Seizure-like activity [facility](HCC)    ED Discharge Orders    None       Raeford RazorKohut, Maleea Camilo, MD 08/07/18 1547

## 2018-08-07 NOTE — Telephone Encounter (Signed)
Best number [phone] [name] Cruz called  His mother is wanting his rx sent [facility]  Carvedilol levetiracetam 750mg  atrovastatin   Pt is almost out of meds

## 2018-08-07 NOTE — ED Notes (Signed)
Pt states he does not use any assistive devices to walk at home. Lives at home with his sister

## 2018-08-07 NOTE — ED Triage Notes (Addendum)
Pt arrives by EMs with complaints of possible stroke. LSN 1050. Pt has HX of LVO. NIH of 4. Dr. Lorraine Lax at bedside. Cancelling stroke at 1203 due to pt being at baseline.

## 2018-08-07 NOTE — Consult Note (Addendum)
Requesting Physician: Dr. Wilson Singer, EDP    Chief Complaint: sudden onset facial droop and aphasia   History obtained from: Patient and Chart and Family  HPI:                                                                                                                                       [name] Cruz is a 63 y.o. male CAD s/p CABG in 02/24/2017, multiple CVA's, seizure disorder, HTN, HLD, who presented to the ED via EMS due to sudden onset aphasia, dysarthria and facial droop. Per EMS the patient was in the passenger seat of the truck preparing to enter a restaurant for dinner when the symptoms began. Per family present there were no prior symptoms or unusual behavior leading to the event. He was reportedly in his usual stat of health. Prior to exiting the vehicle he suddenly stopped speaking, would not respond to command and starred into space but they did not recall any tremor or jerking motion. Upon EMS arrival he appeared to be returned to baseline per family.  Per sister, [name] Cruz, the patient has notable word finding difficulty, delayed speech responses and confusion at baseline. He is unable to drive but walks with minimal residual bilateral leg weakness. He often uses his hands to assist in attempting to communicate.  The patient was unable to provide a history of the events stating that he went to eat and then was brought to the hospital and he was not sure why.  The patient denied headache, visual changes, cough, difficulty swallowing, chest pain, abdominal pain, dysuria, hematuria, diarrhea, constipation, myalgias, joint pain or malaise.   Date last known well: 08/07/2018 Time last known well: 1050 hours tPA Given: Not indicated NIHSS: Score 5 Baseline MRS Score ~5 at baseline  Past Medical History:  Diagnosis Date  . Acute deep vein thrombosis (DVT) of left upper extremity (HCC)    L brachial and basilic veins, eliquis started 08/22/2017 to continue for 3 months total   .  Cognitive impairment 2007   after stroke, saw rehab but told to stop because was too upsetting to him  . History of chicken pox   . HLD (hyperlipidemia)   . HTN (hypertension)   . NSTEMI (non-ST elevated myocardial infarction) ([place]) 02/21/2017  . Obesity   . Stroke, hemorrhagic ([place]) 2007   thought 2/2 HTN (240sbp); residual cognitive impairment, loss of R peripheral field, no driving    Past Surgical History:  Procedure Laterality Date  . ANKLE SURGERY  1990s   right foot with plate and screws  . CORONARY ARTERY BYPASS GRAFT N/A 02/24/2017   3v Procedure: CORONARY ARTERY BYPASS GRAFTING (CABG) x 3 ON PUMP USING LEFT INTERNAL MAMMARY ARTERY TO LEFT ANTERIOR DESENDING CORNARY ARTERY, RIGHT GREATER SAPHENOUS VEIN TO LEFT CIRCUMFLEX ARTERY AND POSTERIOR DESENDING ARTERY. RIGHT GREATER SAPHENOUS VEIN OBTAINED VIA ENDOVEIN HARVEST.;  Surgeon: Grace Isaac, MD  .  IABP INSERTION N/A 02/21/2017   Procedure: IABP Insertion;  Surgeon: Yvonne KendallEnd, Christopher, MD;  Location: [facility];  Service: Cardiovascular;  Laterality: N/A;  . LEFT HEART CATH AND CORONARY ANGIOGRAPHY N/A 02/21/2017   Procedure: LEFT HEART CATH AND CORONARY ANGIOGRAPHY;  Surgeon: Yvonne KendallEnd, Christopher, MD;  Location: [facility];  Service: Cardiovascular;  Laterality: N/A;  . TEE WITHOUT CARDIOVERSION N/A 02/24/2017   Procedure: TRANSESOPHAGEAL ECHOCARDIOGRAM (TEE);  Surgeon: Delight OvensGerhardt, Edward B, MD;  [facility];  Service: Open Heart Surgery;  Laterality: N/A;    Family History  Problem Relation Age of Onset  . Alzheimer's disease Maternal Grandfather   . Cancer Mother        lymphoma  . Alcohol abuse Father        smoker  . Coronary artery disease Neg Hx   . Stroke Neg Hx   . Diabetes Neg Hx    Social History:  reports that he has never smoked. He has quit using smokeless tobacco. He reports previous alcohol use. He reports that he does not use drugs.  Allergies:  Allergies  Allergen Reactions  . Losartan  Other (See Comments)    hyperkalemia   Medications:                                                                                                                        I reviewed home medications List confirmed with sister who patient reports assist with administering them.  Current Outpatient Medications  Medication Instructions  . aspirin 325 mg, Oral, Daily  . atorvastatin (LIPITOR) 80 MG tablet TAKE 1 TABLET (80 MG TOTAL) BY MOUTH DAILY. FOR CHOLESTEROL  . carvedilol (COREG) 3.125 mg, Oral, 2 times daily  . Cyanocobalamin (B-12) 1000 MCG SUBL 1 tablet, Sublingual, Daily  . Lacosamide (VIMPAT) 100 MG TABS TAKE 100 MG IN THE MORNING AND 200 MG IN THE EVENING ( TOTAL OF 300 MG DAILY )  . levETIRAcetam (KEPPRA) 1,500 mg, Oral, 2 times daily  . Multiple Vitamin (MULTIVITAMIN WITH MINERALS) TABS tablet 1 tablet, Oral, Daily  . sertraline (ZOLOFT) 25 MG tablet TAKE ONE-HALF TABLET BY  MOUTH AT BEDTIME   ROS:                                                                                                                                     14 systems reviewed and negative except above  Examination:                                                                                                     General: Appears well-developed, in no acute distress. Psych: Affect appropriate to situation Eyes: No scleral injection HENT: No OP obstrucion Head: Normocephalic.  Cardiovascular: Normal rate and regular rhythm.  Respiratory: Effort normal and breath sounds normal to ascultation GI: Soft.  No distension. There is no tenderness.  Skin: WDI  Neurological Examination Mental Status: Alert, oriented only to self, thought content appropriate.  Afluent with notable dysphasia. Able to follow 3 step commands but not repetitively as he becomes confused with repeat commands. Cranial Nerves: II: Visual fields grossly normal but he has difficulty expressing what he sees III,IV, VI: ptosis not  present, extra-ocular motions intact bilaterally, pupils equal, round, reactive to light and accommodation V,VII: smile symmetric, facial light touch sensation normal bilaterally VIII: hearing symmetric bilaterally IX,X: uvula rises symmetrically XI: bilateral shoulder shrug XII: midline tongue extension Motor: Right : Upper extremity   5/5    Left:     Upper extremity   5/5  Lower extremity   5/5     Lower extremity   5/5 Tone and bulk:normal tone throughout; no atrophy noted Sensory: Pinprick and light touch intact throughout, bilaterally Deep Tendon Reflexes: 2+ on the left, 1+ on the right patellar  Plantars: Right: downgoing   Left: downgoing Cerebellar: finger-to-nose testing does not appear to reveal ataxia, however the patient has difficulty perfoming this many steps, he will initially follow normally but rapidly becomes confused and is unable to complete. He could not comprehend the heel-to-shin test Gait: gait and station are slow but balanced  Lab Results: Basic Metabolic Panel: Recent Labs  Lab 08/07/18 1200 08/07/18 1202  NA 139 138  K 4.4 4.3  CL 104 101  CO2 27  --   GLUCOSE 101* 97  BUN <5* 5*  CREATININE 0.74 0.70  CALCIUM 8.7*  --     CBC: Recent Labs  Lab 08/07/18 1200 08/07/18 1202  WBC 6.3  --   NEUTROABS 4.0  --   HGB 12.6* 12.6*  HCT 38.6* 37.0*  MCV 93.7  --   PLT 187  --     Coagulation Studies: Recent Labs    08/07/18 1200  LABPROT 14.4  INR 1.1    Imaging: Ct Head Code Stroke Wo Contrast  Result Date: 08/07/2018 CLINICAL DATA:  Code stroke.  Aphasia. EXAM: CT HEAD WITHOUT CONTRAST TECHNIQUE: Contiguous axial images were obtained from the base of the skull through the vertex without intravenous contrast. COMPARISON:  Head CT 07/08/2018 and MRI 07/09/2018 FINDINGS: Brain: There is no evidence of acute infarct, intracranial hemorrhage, mass, midline shift, or extra-axial fluid collection. Left temporoparietal encephalomalacia is again  noted with associated calcification and ex vacuo dilatation of the left lateral ventricle. Patchy hypodensities elsewhere in the cerebral white matter bilaterally are unchanged and nonspecific but compatible with moderate chronic small vessel ischemic disease. Vascular: Calcified atherosclerosis at the skull base. No hyperdense vessel. Skull: No fracture or  focal osseous lesion. Sinuses/Orbits: Chronic left mastoid effusion. Clear paranasal sinuses. Unremarkable orbits. Other: None. ASPECTS [facility]([place] Stroke Program Early CT Score) - Ganglionic level infarction (caudate, lentiform nuclei, internal capsule, insula, M1-M3 cortex): 7 - Supraganglionic infarction (M4-M6 cortex): 3 Total score (0-10 with 10 being normal): 10 IMPRESSION: 1. No evidence of acute intracranial abnormality. 2. ASPECTS is 10. 3. Chronic ischemia and encephalomalacia as above. These results were communicated to Dr. Laurence Cruz at 12:15 pm on 08/07/2018 by text page via the [facility] messaging system. Electronically Signed   By: [name] AcheAllen  Cruz M.D.   On: 08/07/2018 12:16    ASSESSMENT AND PLAN    Assessment: [name] Cruz is a 63 y.o. male CAD s/p CABG, multiple CVA's, seizure disorder, HTN, HLD, who presented to the ED via EMS due to sudden onset aphasia and facial droop which resolved by arrival to the ED when symptoms were compared to baseline. He has an abnormal baseline as described in the HPI. CT did not reveal an acute intracranial abnormality. Given his history of CVA and CAD he is at high risk for reoccurrence and warrants and MRI for complete evaluation. This is most likely due to a seizure from a post stroke foci induced by possible infectious source (none identified to date) vs medication nonadherence (sister denied missed doses) as a result of baseline confusion and dementia.   Impression: Likely seizure from a post stroke foci given known Hx of seizure disorder Possible missed antiepileptic dose vs infectious etiology  Plan: -MRI  brain -Continue Lacosamide and Vimpat -Monitor in ED after MRI -Consider infectious etiology, recommend UA and CXR  [name] BalLawrence Harbrecht, MD [facility], PGY-3  Please see A/P and/or Addendum for final recommendations by: Cristo Ausburn [facility] Pager Number [phone]   NEUROHOSPITALIST ADDENDUM Performed a face to face diagnostic evaluation.   I have reviewed the contents of history and physical exam as documented by PA/ARNP/Resident and agree with above documentation.  I have discussed and formulated the above plan as documented. [name] to the note have been made as needed.   Patient presented with sudden onset aphasia and facial droop.  Likely secondary to seizure versus acute stroke.  Recommend MRI brain as well as infectious work-up to assess for recrudescence of stroke symptoms.  Continue Vimpat and Keppra.     [name] SpinnerSushanth Kiet Geer MD [facility] [phone]   If 7pm to 7am, please call on call as listed on [employer]ION.

## 2018-08-07 NOTE — ED Provider Notes (Signed)
Patient CARE signed out to follow-up MRI results.  Patient has history of stroke and seizures, initially it was thought most likely seizure however with significant history plan for MRI.  MRI results reviewed showing acute/subacute occult stroke.  Discussed with hospitalist for observation and monitoring.  Re-paged neurology to update them on the MRI result.  Aspirin ordered.   Golda Acre, MD 08/07/18 [phone]

## 2018-08-07 NOTE — ED Notes (Addendum)
Pt sister Newt Minion POA [phone], ask that no info is given to anyone else

## 2018-08-07 NOTE — Discharge Instructions (Signed)
We suspect you had a seizure today. Keep taking your medications as prescribed. We are not making any changes today. Follow-up with your neurologist.

## 2018-08-07 NOTE — Telephone Encounter (Signed)
Sertraline was refilled on 07/13/2018 to [facility] for a year. Should Keppra come from neurologist?

## 2018-08-07 NOTE — H&P (Signed)
TRH H&P   Patient Demographics:    [name] Cruz, is a 63 y.o. male  MRN: [id number]   DOB - Oct 23, 1955  Admit Date - 08/07/2018  Outpatient Primary MD for the patient is [name], [name], MD  Referring MD/NP/PA: Dr Jodi MourningZavitz  Outpatient Specialists: Neuro Dr Pearlean BrownieSethi  Patient coming from: Home  Chief Complaint  Patient presents with  . Code Stroke      HPI:    [name] HawthorneBilly Cruz  is a 63 y.o. male, with past medical history of CAD, status post CABG in 2019, multiple CVAs in the past, atrial fibrillation, seizures, ICH, hypertensions, hyperlipidemia, [facility] due to acute CVA in last June, patient presents to ED due to sudden onset aphasia, dysarthria and facial droop, per EMS patient noted to suddenly stop speaking, not respond to command, and stared into the space, there is no mentioning of any seizures activity, tremors, jerking motion, urine or stool incontinence, at baseline patient have some word finding difficulty, with delayed speech,, unable to drive, but walks with minimal residual bilateral leg weakness,. -In ED MRI was significant for acute/subacute right cerebral stroke, patient received full dose aspirin, was seen by neurology, and I was called to admit for further evaluation.    Review of systems:    In addition to the HPI above,  No Fever-chills, No Headache, No changes with Vision or hearing, No problems swallowing food or Liquids, No Chest pain, Cough or Shortness of Breath, No Abdominal pain, No Nausea or Vommitting, Bowel movements are regular, No Blood in stool or Urine, No dysuria, No new skin rashes or bruises, No new joints pains-aches,  No new weakness, tingling, numbness in any extremity, No recent weight gain or loss, No polyuria, polydypsia or polyphagia, No significant Mental Stressors.  A full 10 point Review  of Systems was done, except as stated above, all other Review of Systems were negative.   With Past History of the following :    Past Medical History:  Diagnosis Date  . Acute deep vein thrombosis (DVT) of left upper extremity (HCC)    L brachial and basilic veins, eliquis started 08/22/2017 to continue for 3 months total   . Cognitive impairment 2007   after stroke, saw rehab but told to stop because was too upsetting to him  . History of chicken pox   . HLD (hyperlipidemia)   . HTN (hypertension)   . NSTEMI (non-ST elevated myocardial infarction) (HCC) 02/21/2017  . Obesity   . Stroke, hemorrhagic (HCC) 2007   thought 2/2 HTN (240sbp); residual cognitive impairment, loss of R peripheral field, no driving      Past Surgical History:  Procedure Laterality Date  . ANKLE SURGERY  1990s   right foot with plate and screws  . CORONARY ARTERY BYPASS GRAFT N/A 02/24/2017   3v Procedure: CORONARY ARTERY BYPASS GRAFTING (CABG) x 3  ON PUMP USING LEFT INTERNAL MAMMARY ARTERY TO LEFT ANTERIOR DESENDING CORNARY ARTERY, RIGHT GREATER SAPHENOUS VEIN TO LEFT CIRCUMFLEX ARTERY AND POSTERIOR DESENDING ARTERY. RIGHT GREATER SAPHENOUS VEIN OBTAINED VIA ENDOVEIN HARVEST.;  Surgeon: Grace Isaac, MD  . IABP INSERTION N/A 02/21/2017   Procedure: IABP Insertion;  Surgeon: Nelva Bush, MD;  Location: [place] [facility];  Service: Cardiovascular;  Laterality: N/A;  . LEFT HEART CATH AND CORONARY ANGIOGRAPHY N/A 02/21/2017   Procedure: LEFT HEART CATH AND CORONARY ANGIOGRAPHY;  Surgeon: Nelva Bush, MD;  Location: [place] [facility];  Service: Cardiovascular;  Laterality: N/A;  . TEE WITHOUT CARDIOVERSION N/A 02/24/2017   Procedure: TRANSESOPHAGEAL ECHOCARDIOGRAM (TEE);  Surgeon: Grace Isaac, MD;  Location: [place];  Service: Open Heart Surgery;  Laterality: N/A;      Social History:     Social History   Tobacco Use  . Smoking status: Never Smoker  . Smokeless tobacco: Former [employer] Use Topics  . Alcohol use: Not Currently       Family History :     Family History  Problem Relation Age of Onset  . Alzheimer's disease Maternal Grandfather   . Cancer Mother        lymphoma  . Alcohol abuse Father        smoker  . Coronary artery disease Neg Hx   . Stroke Neg Hx   . Diabetes Neg Hx      Home Medications:   Prior to Admission medications   Medication Sig Start Date End Date Taking? Authorizing Provider  aspirin 325 MG tablet Take 1 tablet (325 mg total) by mouth daily. 07/11/18  Yes Dhungel, Nishant, MD  Cyanocobalamin (B-12) 1000 MCG SUBL Place 1 tablet under the tongue daily. Patient taking differently: Place 1,000 mcg under the tongue daily.  12/26/17  Yes Ria Bush, MD  Lacosamide (VIMPAT) 100 MG TABS TAKE 100 MG IN THE MORNING AND 200 MG IN THE EVENING ( TOTAL OF 300 MG DAILY ) 07/14/18  Yes Ria Bush, MD  levETIRAcetam (KEPPRA) 750 MG tablet Take 2 tablets (1,500 mg total) by mouth 2 (two) times daily. 05/11/18  Yes Venancio Poisson, NP  Multiple Vitamin (MULTIVITAMIN WITH MINERALS) TABS tablet Take 1 tablet by mouth daily. 12/26/17  Yes Ria Bush, MD  sertraline (ZOLOFT) 25 MG tablet TAKE ONE-HALF TABLET BY  MOUTH AT BEDTIME 07/13/18  Yes Ria Bush, MD  atorvastatin (LIPITOR) 80 MG tablet Take 1 tablet (80 mg total) by mouth daily. 08/07/18   Ria Bush, MD  carvedilol (COREG) 3.125 MG tablet Take 1 tablet (3.125 mg total) by mouth 2 (two) times daily. 08/07/18   Ria Bush, MD     Allergies:     Allergies  Allergen Reactions  . Losartan Other (See Comments)    hyperkalemia     Physical Exam:   Vitals  Blood pressure 117/71, pulse 64, temperature 98.5 F (36.9 C), temperature source Oral, resp. rate 12, height 5\' 9"  (1.753 m), weight 79.4 kg, SpO2 99 %.   1. General well-developed male laying in bed in no apparent distress  2. Normal affect and insight, Not Suicidal or Homicidal, Awake  Alert, Oriented X 3.  3.  Mild dysphagia and speech slowing noted, Strength 5/5 all 4 extremities, Sensation intact all 4 extremities, Plantars down going.  4. Ears and Eyes appear Normal, Conjunctivae clear, PERRLA. Moist Oral Mucosa.  5. Supple Neck, No JVD, No cervical lymphadenopathy appriciated, No Carotid Bruits.  6. Symmetrical  Chest wall movement, Good air movement bilaterally, CTAB.  7. RRR, No Gallops, Rubs or Murmurs, No Parasternal Heave.  8. Positive Bowel Sounds, Abdomen Soft, No tenderness, No organomegaly appriciated,No rebound -guarding or rigidity.  9.  No Cyanosis, Normal Skin Turgor, No Skin Rash or Bruise.  10. Good muscle tone,  joints appear normal , no effusions, Normal ROM.  11. No Palpable Lymph Nodes in Neck or Axillae     Data Review:    CBC Recent Labs  Lab 08/07/18 1200 08/07/18 1202  WBC 6.3  --   HGB 12.6* 12.6*  HCT 38.6* 37.0*  PLT 187  --   MCV 93.7  --   MCH 30.6  --   MCHC 32.6  --   RDW 12.1  --   LYMPHSABS 1.6  --   MONOABS 0.5  --   EOSABS 0.1  --   BASOSABS 0.1  --    ------------------------------------------------------------------------------------------------------------------  Chemistries  Recent Labs  Lab 08/07/18 1200 08/07/18 1202  NA 139 138  K 4.4 4.3  CL 104 101  CO2 27  --   GLUCOSE 101* 97  BUN <5* 5*  CREATININE 0.74 0.70  CALCIUM 8.7*  --   AST 18  --   ALT 22  --   ALKPHOS 51  --   BILITOT 0.5  --    ------------------------------------------------------------------------------------------------------------------ estimated creatinine clearance is 94.5 mL/min (by C-G formula based on SCr of 0.7 mg/dL). ------------------------------------------------------------------------------------------------------------------ No results for input(s): TSH, T4TOTAL, T3FREE, THYROIDAB in the last 72 hours.  Invalid input(s): FREET3  Coagulation profile Recent Labs  Lab 08/07/18 1200  INR 1.1    ------------------------------------------------------------------------------------------------------------------- No results for input(s): DDIMER in the last 72 hours. -------------------------------------------------------------------------------------------------------------------  Cardiac Enzymes No results for input(s): CKMB, TROPONINI, MYOGLOBIN in the last 168 hours.  Invalid input(s): CK ------------------------------------------------------------------------------------------------------------------ No results found for: BNP   ---------------------------------------------------------------------------------------------------------------  Urinalysis    Component Value Date/Time   COLORURINE COLORLESS (A) 07/09/2018 0000   APPEARANCEUR CLEAR 07/09/2018 0000   LABSPEC 1.014 07/09/2018 0000   PHURINE 7.0 07/09/2018 0000   GLUCOSEU NEGATIVE 07/09/2018 0000   HGBUR NEGATIVE 07/09/2018 0000   BILIRUBINUR NEGATIVE 07/09/2018 0000   KETONESUR NEGATIVE 07/09/2018 0000   PROTEINUR NEGATIVE 07/09/2018 0000   UROBILINOGEN 0.2 04/26/2012 1900   NITRITE NEGATIVE 07/09/2018 0000   LEUKOCYTESUR NEGATIVE 07/09/2018 0000    ----------------------------------------------------------------------------------------------------------------   Imaging Results:    Mr Brain Wo Contrast  Result Date: 08/07/2018 CLINICAL DATA:  Altered level of consciousness. Facial droop and aphasia. EXAM: MRI HEAD WITHOUT CONTRAST TECHNIQUE: Multiplanar, multiecho pulse sequences of the brain and surrounding structures were obtained without intravenous contrast. COMPARISON:  Head CT 08/07/2018 and MRI 07/09/2018 FINDINGS: Brain: There is a 4 mm focus of trace diffusion signal hyperintensity involving white matter of right forceps major with normal to subtly reduced ADC. No acute infarct is identified elsewhere. Left temporoparietal encephalomalacia is again noted related to a remote hemorrhage with ex vacuo  dilatation of the left lateral ventricle. Multiple chronic microhemorrhages involving both cerebral hemispheres are similar to the prior study with superficial greater than deep involvement. Patchy to confluent T2 hyperintensities in the cerebral white matter bilaterally are unchanged from the prior MRI and nonspecific but compatible with moderate chronic small vessel ischemic disease. Chronic lacunar infarcts are present in the right thalamus and genu of the right internal capsule. There is moderate global cerebral atrophy. Vascular: Major intracranial vascular flow voids are preserved. Skull and upper cervical spine: Unremarkable bone  marrow signal. Sinuses/Orbits: Unremarkable orbits. Clear paranasal sinuses. Chronic left larger than right mastoid effusions. Other: None. IMPRESSION: 1. 4 mm acute/subacute white matter infarct involving right forceps major. 2. Moderate chronic small vessel ischemic disease. 3. Remote left temporoparietal hemorrhage with resultant encephalomalacia. 4. Multiple chronic cerebral microhemorrhages which could reflect cerebral amyloid angiopathy or hypertension. Electronically Signed   By: Sebastian AcheAllen  Grady M.D.   On: 08/07/2018 16:03   Ct Head Code Stroke Wo Contrast  Result Date: 08/07/2018 CLINICAL DATA:  Code stroke.  Aphasia. EXAM: CT HEAD WITHOUT CONTRAST TECHNIQUE: Contiguous axial images were obtained from the base of the skull through the vertex without intravenous contrast. COMPARISON:  Head CT 07/08/2018 and MRI 07/09/2018 FINDINGS: Brain: There is no evidence of acute infarct, intracranial hemorrhage, mass, midline shift, or extra-axial fluid collection. Left temporoparietal encephalomalacia is again noted with associated calcification and ex vacuo dilatation of the left lateral ventricle. Patchy hypodensities elsewhere in the cerebral white matter bilaterally are unchanged and nonspecific but compatible with moderate chronic small vessel ischemic disease. Vascular: Calcified  atherosclerosis at the skull base. No hyperdense vessel. Skull: No fracture or focal osseous lesion. Sinuses/Orbits: Chronic left mastoid effusion. Clear paranasal sinuses. Unremarkable orbits. Other: None. ASPECTS [facility]([place] Stroke Program Early CT Score) - Ganglionic level infarction (caudate, lentiform nuclei, internal capsule, insula, M1-M3 cortex): 7 - Supraganglionic infarction (M4-M6 cortex): 3 Total score (0-10 with 10 being normal): 10 IMPRESSION: 1. No evidence of acute intracranial abnormality. 2. ASPECTS is 10. 3. Chronic ischemia and encephalomalacia as above. These results were communicated to Dr. Laurence SlateAroor at 12:15 pm on 08/07/2018 by text page via the [facility] messaging system. Electronically Signed   By: Sebastian AcheAllen  Grady M.D.   On: 08/07/2018 12:16    EKG not done yet, it has been ordered, but he appears to be on sinus rhythm on telemetry   Assessment & Plan:    Active Problems:   HLD (hyperlipidemia)   HTN (hypertension)   Seizure disorder (HCC)   Ischemic stroke (HCC)   Acute CVA (cerebrovascular accident) (HCC)   Acute ischemic CVA -Patient MRI significant for acute/subacute CVA, reports he is compliant with his aspirin, he is already on full dose aspirin, received 1 dose in ED. -Patient had recent work-up including echo, CTA head and neck, and EEG during recent hospitalization last month, no need to repeat. -We will hold antihypertensive regimen and allow for permissive hypertension. -Patient with history of A. fib in the past, will discuss with neurology if any further work-up is indicated regarding A. Fib.  Hypertension -We will hold home medication to allow for permissive blood pressure  History of seizures -Continue with Keppra, and Vimpat(dose was increased on recent admission)  CAD with history of CABG -Continue with statin, aspirin, resume beta-blockers once cleared by neuro     DVT Prophylaxis Heparin -   SCDs  AM Labs Ordered, also please review Full Orders   Family Communication: Admission, patients condition and plan of care including tests being ordered have been discussed with the patient who indicate understanding and agree with the plan and Code Status.  Code Status Full  Likely DC to  Home  Condition GUARDED    Consults called: neurology  Admission status: inpatient  Time spent in minutes :55 minutes   Huey Bienenstockawood Nicosha Struve M.D on 08/07/2018 at 6:09 PM  Between 7am to 7pm - Pager - [phone]. After 7pm go to [email or web address] - password [email or web address]  [facility] - Office  [phone]

## 2018-08-07 NOTE — Telephone Encounter (Signed)
[name] Cruz Wanted any others med that dr g prescribes sent to [facility]  sertriline 25mg 

## 2018-08-07 NOTE — Telephone Encounter (Signed)
Lipitor, carvedilol and sertraline refilled for patient.  They should contact neurology for keppra refill.

## 2018-08-07 NOTE — ED Notes (Signed)
[facility] called to cancel code stroke per Dr Aroor 

## 2018-08-08 ENCOUNTER — Encounter (HOSPITAL_COMMUNITY): Payer: Self-pay

## 2018-08-08 DIAGNOSIS — Z86718 Personal history of other venous thrombosis and embolism: Secondary | ICD-10-CM | POA: Diagnosis not present

## 2018-08-08 DIAGNOSIS — Z811 Family history of alcohol abuse and dependence: Secondary | ICD-10-CM | POA: Diagnosis not present

## 2018-08-08 DIAGNOSIS — I252 Old myocardial infarction: Secondary | ICD-10-CM | POA: Diagnosis not present

## 2018-08-08 DIAGNOSIS — F329 Major depressive disorder, single episode, unspecified: Secondary | ICD-10-CM | POA: Diagnosis present

## 2018-08-08 DIAGNOSIS — Z8674 Personal history of sudden cardiac arrest: Secondary | ICD-10-CM | POA: Diagnosis not present

## 2018-08-08 DIAGNOSIS — Z87891 Personal history of nicotine dependence: Secondary | ICD-10-CM | POA: Diagnosis not present

## 2018-08-08 DIAGNOSIS — I69319 Unspecified symptoms and signs involving cognitive functions following cerebral infarction: Secondary | ICD-10-CM | POA: Diagnosis not present

## 2018-08-08 DIAGNOSIS — Z20828 Contact with and (suspected) exposure to other viral communicable diseases: Secondary | ICD-10-CM | POA: Diagnosis present

## 2018-08-08 DIAGNOSIS — E785 Hyperlipidemia, unspecified: Secondary | ICD-10-CM | POA: Diagnosis present

## 2018-08-08 DIAGNOSIS — I480 Paroxysmal atrial fibrillation: Secondary | ICD-10-CM | POA: Diagnosis present

## 2018-08-08 DIAGNOSIS — Z951 Presence of aortocoronary bypass graft: Secondary | ICD-10-CM | POA: Diagnosis not present

## 2018-08-08 DIAGNOSIS — E782 Mixed hyperlipidemia: Secondary | ICD-10-CM | POA: Diagnosis not present

## 2018-08-08 DIAGNOSIS — R4701 Aphasia: Secondary | ICD-10-CM | POA: Diagnosis present

## 2018-08-08 DIAGNOSIS — I2510 Atherosclerotic heart disease of native coronary artery without angina pectoris: Secondary | ICD-10-CM | POA: Diagnosis present

## 2018-08-08 DIAGNOSIS — Z820 Family history of epilepsy and other diseases of the nervous system: Secondary | ICD-10-CM | POA: Diagnosis not present

## 2018-08-08 DIAGNOSIS — G40909 Epilepsy, unspecified, not intractable, without status epilepticus: Secondary | ICD-10-CM | POA: Diagnosis present

## 2018-08-08 DIAGNOSIS — I639 Cerebral infarction, unspecified: Secondary | ICD-10-CM | POA: Diagnosis not present

## 2018-08-08 DIAGNOSIS — R29705 NIHSS score 5: Secondary | ICD-10-CM | POA: Diagnosis present

## 2018-08-08 DIAGNOSIS — Z888 Allergy status to other drugs, medicaments and biological substances status: Secondary | ICD-10-CM | POA: Diagnosis not present

## 2018-08-08 DIAGNOSIS — Z7982 Long term (current) use of aspirin: Secondary | ICD-10-CM | POA: Diagnosis not present

## 2018-08-08 DIAGNOSIS — I10 Essential (primary) hypertension: Secondary | ICD-10-CM | POA: Diagnosis not present

## 2018-08-08 DIAGNOSIS — Z79899 Other long term (current) drug therapy: Secondary | ICD-10-CM | POA: Diagnosis not present

## 2018-08-08 DIAGNOSIS — R29810 Facial weakness: Secondary | ICD-10-CM | POA: Diagnosis present

## 2018-08-08 MED ORDER — ASPIRIN EC 81 MG PO TBEC: 81.0000 mg | DELAYED_RELEASE_TABLET | Freq: Every day | ORAL | Status: DC

## 2018-08-08 MED ORDER — CLOPIDOGREL BISULFATE 75 MG PO TABS: 75.0000 mg | ORAL_TABLET | Freq: Every day | ORAL | 0 refills | Status: DC

## 2018-08-08 MED ORDER — CLOPIDOGREL BISULFATE 75 MG PO TABS
75.0000 mg | ORAL_TABLET | Freq: Every day | ORAL | Status: DC
  Administered 2018-08-08: 75 mg via ORAL

## 2018-08-08 MED ORDER — ASPIRIN 81 MG PO TBEC: 81.0000 mg | DELAYED_RELEASE_TABLET | Freq: Every day | ORAL | 0 refills | Status: AC

## 2018-08-08 NOTE — Discharge Summary (Signed)
Physician Discharge Summary  [name] Cruz ZOX:[id number] DOB: Dec 18, 1955 DOA: 08/07/2018  PCP: [name], MD  Admit date: 08/07/2018 Discharge date: 08/08/2018  Admitted From: Home  Disposition:  Home   Recommendations for Outpatient Follow-up and new medication changes:  1. Follow up with Dr. Renee Ramus in 7 days.  2. Follow up with Neurology as scheduled. 3. Patient has been placed on clopidogrel and asa.   Home Health: no   Equipment/Devices: no    Discharge Condition: stable  CODE STATUS: full  Diet recommendation: heart healthy and diabetic prudent.   Brief/Interim Summary: 63 year old male who presented with sudden onset of aphasia, dysarthria, and facial droop.  He does have significant past medical history for coronary artery disease, status post bypass grafting 2019, multiple CVAs, atrial fibrillation, seizures, intracerebral hemorrhage, hypertension and dyslipidemia.  When EMS arrived, he was noted to stare into space, no evident tonic or clonic activity.  On his initial physical examination his blood pressure was 117/71, heart rate 64, temperature 98.5, respiratory rate 12, oxygen saturation 99%, his lungs were clear to auscultation bilaterally, heart S1-S2 present rhythm, abdomen was soft nontender, neurologically patient was nonfocal.    Sodium 139, potassium 4.4, chloride 104, bicarb 27, glucose 101, BUN less than 5, creatinine 0.74, white count 6.3, hemoglobin 12.6, hematocrit 38.6, platelets 187.  SARS COVID-19 negative.  Urinalysis negative for infection.  Alcohol less than 10, tox screen negative.  Head CT with no acute changes.  Brain MRI with 4 mm acute/subacute white matter infarct involving the right forceps major.  Moderate chronic small vessel ischemic changes.  Old left temporal parietal hemorrhage with resultant encephalomalacia.  Multiple chronic cerebral micro-hemorrhages.  EKG 76 bpm, left axis deviation, normal intervals, sinus rhythm, no ST segment T  wave changes.  Patient was admitted to the hospital working diagnosis of acute/subacute ischemic infarct involving the right forceps major.   1.  Acute/subacute ischemic infarct involving the right forceps major.  Patient was admitted to the medical ward, he was placed on a telemetry monitor.  He had no further neurologic deficits.  Patient was seen by physical therapy.  Neurology was consulted, recommendations to continue statin therapy, change anticoagulation to dual antiplatelet therapy with aspirin and clopidogrel.  Follow-up with neurology as an outpatient.  Patient had a full ischemic work-up last month including CTA head and neck, electroencephalography and echocardiography.  2.  History of seizures.  Continue Keppra and lacosamide.  3.  Dyslipidemia.  Continue atorvastatin.  4.  Depression.  Continue sertraline.  5.  History of paroxysmal atrial fibrillation.  Patient remains sinus rhythm, continue rate control with carvedilol and anticoagulation with dual anti-therapy with aspirin and clopidogrel.  Patient had history of hemorrhagic stroke in the past.  Discharge Diagnoses:  Principal Problem:   Ischemic stroke (HCC) Active Problems:   HLD (hyperlipidemia)   HTN (hypertension)   Seizure disorder (HCC)   Acute CVA (cerebrovascular accident) (HCC)   CVA (cerebral vascular accident) [facility])    Discharge Instructions   Allergies as of 08/08/2018      Reactions   Losartan Other (See Comments)   hyperkalemia      Medication List    STOP taking these medications   aspirin 325 MG tablet Replaced by: aspirin 81 MG EC tablet     TAKE these medications   aspirin 81 MG EC tablet Take 1 tablet (81 mg total) by mouth daily. Start taking on: August 09, 2018 Replaces: aspirin 325 MG tablet   atorvastatin 80 MG  tablet Commonly known as: LIPITOR Take 1 tablet (80 mg total) by mouth daily.   B-12 1000 MCG Subl Place 1 tablet under the tongue daily. What changed: how much  to take   carvedilol 3.125 MG tablet Commonly known as: COREG Take 1 tablet (3.125 mg total) by mouth 2 (two) times daily.   clopidogrel 75 MG tablet Commonly known as: PLAVIX Take 1 tablet (75 mg total) by mouth daily.   levETIRAcetam 750 MG tablet Commonly known as: KEPPRA Take 2 tablets (1,500 mg total) by mouth 2 (two) times daily.   multivitamin with minerals Tabs tablet Take 1 tablet by mouth daily.   sertraline 25 MG tablet Commonly known as: ZOLOFT TAKE ONE-HALF TABLET BY  MOUTH AT BEDTIME   Vimpat 100 MG Tabs Generic drug: Lacosamide TAKE 100 MG IN THE MORNING AND 200 MG IN THE EVENING ( TOTAL OF 300 MG DAILY )       Allergies  Allergen Reactions  . Losartan Other (See Comments)    hyperkalemia    Consultations:  Neurology    Procedures/Studies: Mr Brain Wo Contrast  Result Date: 08/07/2018 CLINICAL DATA:  Altered level of consciousness. Facial droop and aphasia. EXAM: MRI HEAD WITHOUT CONTRAST TECHNIQUE: Multiplanar, multiecho pulse sequences of the brain and surrounding structures were obtained without intravenous contrast. COMPARISON:  Head CT 08/07/2018 and MRI 07/09/2018 FINDINGS: Brain: There is a 4 mm focus of trace diffusion signal hyperintensity involving white matter of right forceps major with normal to subtly reduced ADC. No acute infarct is identified elsewhere. Left temporoparietal encephalomalacia is again noted related to a remote hemorrhage with ex vacuo dilatation of the left lateral ventricle. Multiple chronic microhemorrhages involving both cerebral hemispheres are similar to the prior study with superficial greater than deep involvement. Patchy to confluent T2 hyperintensities in the cerebral white matter bilaterally are unchanged from the prior MRI and nonspecific but compatible with moderate chronic small vessel ischemic disease. Chronic lacunar infarcts are present in the right thalamus and genu of the right internal capsule. There is  moderate global cerebral atrophy. Vascular: Major intracranial vascular flow voids are preserved. Skull and upper cervical spine: Unremarkable bone marrow signal. Sinuses/Orbits: Unremarkable orbits. Clear paranasal sinuses. Chronic left larger than right mastoid effusions. Other: None. IMPRESSION: 1. 4 mm acute/subacute white matter infarct involving right forceps major. 2. Moderate chronic small vessel ischemic disease. 3. Remote left temporoparietal hemorrhage with resultant encephalomalacia. 4. Multiple chronic cerebral microhemorrhages which could reflect cerebral amyloid angiopathy or hypertension. Electronically Signed   By: Sebastian AcheAllen  Grady M.D.   On: 08/07/2018 16:03   Ct Head Code Stroke Wo Contrast  Result Date: 08/07/2018 CLINICAL DATA:  Code stroke.  Aphasia. EXAM: CT HEAD WITHOUT CONTRAST TECHNIQUE: Contiguous axial images were obtained from the base of the skull through the vertex without intravenous contrast. COMPARISON:  Head CT 07/08/2018 and MRI 07/09/2018 FINDINGS: Brain: There is no evidence of acute infarct, intracranial hemorrhage, mass, midline shift, or extra-axial fluid collection. Left temporoparietal encephalomalacia is again noted with associated calcification and ex vacuo dilatation of the left lateral ventricle. Patchy hypodensities elsewhere in the cerebral white matter bilaterally are unchanged and nonspecific but compatible with moderate chronic small vessel ischemic disease. Vascular: Calcified atherosclerosis at the skull base. No hyperdense vessel. Skull: No fracture or focal osseous lesion. Sinuses/Orbits: Chronic left mastoid effusion. Clear paranasal sinuses. Unremarkable orbits. Other: None. ASPECTS [facility]([place] Stroke Program Early CT Score) - Ganglionic level infarction (caudate, lentiform nuclei, internal capsule, insula, M1-M3 cortex): 7 -  Supraganglionic infarction (M4-M6 cortex): 3 Total score (0-10 with 10 being normal): 10 IMPRESSION: 1. No evidence of acute intracranial  abnormality. 2. ASPECTS is 10. 3. Chronic ischemia and encephalomalacia as above. These results were communicated to Dr. Lorraine Lax at 12:15 pm on 08/07/2018 by text page via the [facility] messaging system. Electronically Signed   By: Logan Bores M.D.   On: 08/07/2018 12:16      Procedures:   Subjective: Patient continue to improve, but not yet back to baseline, no nausea or vomiting, no dyspnea or chest pain. No slurred speech or aphasia.   Discharge Exam: Vitals:   08/08/18 0605 08/08/18 0827  BP: 97/84 119/75  Pulse:  69  Resp: 17 12  Temp: 98.5 F (36.9 C) 98.6 F (37 C)  SpO2: 97% 98%   Vitals:   08/08/18 0016 08/08/18 0416 08/08/18 0605 08/08/18 0827  BP: 116/77 123/75 97/84 119/75  Pulse:  62  69  Resp: 10 18 17 12   Temp:  98.4 F (36.9 C) 98.5 F (36.9 C) 98.6 F (37 C)  TempSrc:  Oral Oral Oral  SpO2: 94% 98% 97% 98%  Weight:      Height:        General: deconditioned  Neurology: Awake and alert, non focal  E ENT: no pallor, no icterus, oral mucosa moist Cardiovascular: No JVD. S1-S2 present, rhythmic, no gallops, rubs, or murmurs. No lower extremity edema. Pulmonary: positive breath sounds bilaterally, adequate air movement, no wheezing, rhonchi or rales. Gastrointestinal. Abdomen with no organomegaly, non tender, no rebound or guarding Skin. No rashes Musculoskeletal: no joint deformities   The results of significant diagnostics from this hospitalization (including imaging, microbiology, ancillary and laboratory) are listed below for reference.     Microbiology: Recent Results (from the past 240 hour(s))  SARS Coronavirus 2 (CEPHEID - Performed in [facility] hospital lab), Hosp Order     Status: None   Collection Time: 08/07/18  6:32 PM   Specimen: Nasopharyngeal Swab  Result Value Ref Range Status   SARS Coronavirus 2 NEGATIVE NEGATIVE Final    Comment: (NOTE) If result is NEGATIVE SARS-CoV-2 target nucleic acids are NOT DETECTED. The SARS-CoV-2 RNA is  generally detectable in upper and lower  respiratory specimens during the acute phase of infection. The lowest  concentration of SARS-CoV-2 viral copies this assay can detect is 250  copies / mL. A negative result does not preclude SARS-CoV-2 infection  and should not be used as the sole basis for treatment or other  patient management decisions.  A negative result may occur with  improper specimen collection / handling, submission of specimen other  than nasopharyngeal swab, presence of viral mutation(s) within the  areas targeted by this assay, and inadequate number of viral copies  (<250 copies / mL). A negative result must be combined with clinical  observations, patient history, and epidemiological information. If result is POSITIVE SARS-CoV-2 target nucleic acids are DETECTED. The SARS-CoV-2 RNA is generally detectable in upper and lower  respiratory specimens dur ing the acute phase of infection.  Positive  results are indicative of active infection with SARS-CoV-2.  Clinical  correlation with patient history and other diagnostic information is  necessary to determine patient infection status.  Positive results do  not rule out bacterial infection or co-infection with other viruses. If result is PRESUMPTIVE POSTIVE SARS-CoV-2 nucleic acids MAY BE PRESENT.   A presumptive positive result was obtained on the submitted specimen  and confirmed on repeat testing.  While  2019 novel coronavirus  (SARS-CoV-2) nucleic acids may be present in the submitted sample  additional confirmatory testing may be necessary for epidemiological  and / or clinical management purposes  to differentiate between  SARS-CoV-2 and other Sarbecovirus currently known to infect humans.  If clinically indicated additional testing with an alternate test  methodology [phone] is advised. The SARS-CoV-2 RNA is generally  detectable in upper and lower respiratory sp ecimens during the acute  phase of  infection. The expected result is Negative. Fact Sheet for Patients:  BoilerBrush.com.cyhttps://www.fda.gov/media/136312/download Fact Sheet for Healthcare Providers: https://pope.com/https://www.fda.gov/media/136313/download This test is not yet approved or cleared by the [place] FDA and has been authorized for detection and/or diagnosis of SARS-CoV-2 by FDA under an Emergency Use Authorization (EUA).  This EUA will remain in effect (meaning this test can be used) for the duration of the COVID-19 declaration under Section 564(b)(1) of the Act, 21 U.S.C. section 360bbb-3(b)(1), unless the authorization is terminated or revoked sooner. Performed at [facility], [address] [place], [place] [address]      Labs: BNP (last 3 results) No results for input(s): BNP in the last 8760 hours. Basic Metabolic Panel: Recent Labs  Lab 08/07/18 1200 08/07/18 1202  NA 139 138  K 4.4 4.3  CL 104 101  CO2 27  --   GLUCOSE 101* 97  BUN <5* 5*  CREATININE 0.74 0.70  CALCIUM 8.7*  --    Liver Function Tests: Recent Labs  Lab 08/07/18 1200  AST 18  ALT 22  ALKPHOS 51  BILITOT 0.5  PROT 6.3*  ALBUMIN 3.7   No results for input(s): LIPASE, AMYLASE in the last 168 hours. No results for input(s): AMMONIA in the last 168 hours. CBC: Recent Labs  Lab 08/07/18 1200 08/07/18 1202  WBC 6.3  --   NEUTROABS 4.0  --   HGB 12.6* 12.6*  HCT 38.6* 37.0*  MCV 93.7  --   PLT 187  --    Cardiac Enzymes: No results for input(s): CKTOTAL, CKMB, CKMBINDEX, TROPONINI in the last 168 hours. BNP: Invalid input(s): POCBNP CBG: Recent Labs  Lab 08/07/18 1213  GLUCAP 80   D-Dimer No results for input(s): DDIMER in the last 72 hours. Hgb A1c No results for input(s): HGBA1C in the last 72 hours. Lipid Profile No results for input(s): CHOL, HDL, LDLCALC, TRIG, CHOLHDL, LDLDIRECT in the last 72 hours. Thyroid function studies No results for input(s): TSH, T4TOTAL, T3FREE, THYROIDAB in the last 72  hours.  Invalid input(s): FREET3 Anemia work up No results for input(s): VITAMINB12, FOLATE, FERRITIN, TIBC, IRON, RETICCTPCT in the last 72 hours. Urinalysis    Component Value Date/Time   COLORURINE YELLOW 08/07/2018 2216   APPEARANCEUR CLEAR 08/07/2018 2216   LABSPEC 1.010 08/07/2018 2216   PHURINE 7.0 08/07/2018 2216   GLUCOSEU NEGATIVE 08/07/2018 2216   HGBUR NEGATIVE 08/07/2018 2216   BILIRUBINUR NEGATIVE 08/07/2018 2216   KETONESUR NEGATIVE 08/07/2018 2216   PROTEINUR NEGATIVE 08/07/2018 2216   UROBILINOGEN 0.2 04/26/2012 1900   NITRITE NEGATIVE 08/07/2018 2216   LEUKOCYTESUR NEGATIVE 08/07/2018 2216   Sepsis Labs Invalid input(s): PROCALCITONIN,  WBC,  LACTICIDVEN Microbiology Recent Results (from the past 240 hour(s))  SARS Coronavirus 2 (CEPHEID - Performed in [facility]), Hosp Order     Status: None   Collection Time: 08/07/18  6:32 PM   Specimen: Nasopharyngeal Swab  Result Value Ref Range Status   SARS Coronavirus 2 NEGATIVE NEGATIVE Final  Comment: (NOTE) If result is NEGATIVE SARS-CoV-2 target nucleic acids are NOT DETECTED. The SARS-CoV-2 RNA is generally detectable in upper and lower  respiratory specimens during the acute phase of infection. The lowest  concentration of SARS-CoV-2 viral copies this assay can detect is 250  copies / mL. A negative result does not preclude SARS-CoV-2 infection  and should not be used as the sole basis for treatment or other  patient management decisions.  A negative result may occur with  improper specimen collection / handling, submission of specimen other  than nasopharyngeal swab, presence of viral mutation(s) within the  areas targeted by this assay, and inadequate number of viral copies  (<250 copies / mL). A negative result must be combined with clinical  observations, patient history, and epidemiological information. If result is POSITIVE SARS-CoV-2 target nucleic acids are DETECTED. The SARS-CoV-2  RNA is generally detectable in upper and lower  respiratory specimens dur ing the acute phase of infection.  Positive  results are indicative of active infection with SARS-CoV-2.  Clinical  correlation with patient history and other diagnostic information is  necessary to determine patient infection status.  Positive results do  not rule out bacterial infection or co-infection with other viruses. If result is PRESUMPTIVE POSTIVE SARS-CoV-2 nucleic acids MAY BE PRESENT.   A presumptive positive result was obtained on the submitted specimen  and confirmed on repeat testing.  While 2019 novel coronavirus  (SARS-CoV-2) nucleic acids may be present in the submitted sample  additional confirmatory testing may be necessary for epidemiological  and / or clinical management purposes  to differentiate between  SARS-CoV-2 and other Sarbecovirus currently known to infect humans.  If clinically indicated additional testing with an alternate test  methodology [phone] is advised. The SARS-CoV-2 RNA is generally  detectable in upper and lower respiratory sp ecimens during the acute  phase of infection. The expected result is Negative. Fact Sheet for Patients:  BoilerBrush.com.cyhttps://www.fda.gov/media/136312/download Fact Sheet for Healthcare Providers: https://pope.com/https://www.fda.gov/media/136313/download This test is not yet approved or cleared by the [place] FDA and has been authorized for detection and/or diagnosis of SARS-CoV-2 by FDA under an Emergency Use Authorization (EUA).  This EUA will remain in effect (meaning this test can be used) for the duration of the COVID-19 declaration under Section 564(b)(1) of the Act, 21 U.S.C. section 360bbb-3(b)(1), unless the authorization is terminated or revoked sooner. Performed at [facility], [address] [place], [place] [address]      Time coordinating discharge: 45 minutes  SIGNED:   Coralie KeensMauricio Daniel Ronnel Zuercher, MD  [facility] 08/08/2018, 12:37  PM

## 2018-08-08 NOTE — Evaluation (Signed)
Occupational Therapy Evaluation Patient Details Name: DEGAN HANSER MRN: [id number] DOB: Aug 23, 1955 Today's Date: 08/08/2018    History of Present Illness Patient is a 63 y/o male presenting to the ED on 08/07/2018 with primary complaints of aphasia, dysarthria and facial droop. CT with No evidence of acute intracranial abnormality. MRI with 4 mm acute/subacute white matter infarct involving right forceps major. Past medical history of CAD, status post CABG in 2019, multiple CVAs in the past, atrial fibrillation, seizures, ICH, hypertensions, hyperlipidemia.    Clinical Impression   PTA, pt reports living with his sister and independence with ADL and mobility. He currently is limited by decreased cognition, poor awareness, and language deficits. Pt demonstrating poor problem solving and awareness today and was unable to locate the sink with multi-modal and varied cues. Feel that pt would benefit from home health OT follow-up to maximize safety in his home. 24 hour supervision will be necessary to ensure safety.     Follow Up Recommendations  Supervision/Assistance - 24 hour;Home health OT    Equipment Recommendations  None recommended by OT    Recommendations for Other Services       Precautions / Restrictions Precautions Precautions: Fall Restrictions Weight Bearing Restrictions: No      Mobility Bed Mobility Overal bed mobility: Modified Independent                Transfers Overall transfer level: Needs assistance Equipment used: Rolling walker (2 wheeled) Transfers: Sit to/from Stand Sit to Stand: Supervision         General transfer comment: for safety and immediate standing balance    Balance Overall balance assessment: Mild deficits observed, not formally tested                                         ADL either performed or assessed with clinical judgement   ADL Overall ADL's : Needs assistance/impaired                                        General ADL Comments: Needs supervision overall with maximum cues due to safety awareness and cognitive deficits.      Vision Patient Visual Report: No change from baseline Vision Assessment?: No apparent visual deficits     Perception     Praxis      Pertinent Vitals/Pain Pain Assessment: No/denies pain     Hand Dominance Right   Extremity/Trunk Assessment Upper Extremity Assessment Upper Extremity Assessment: Overall WFL for tasks assessed   Lower Extremity Assessment Lower Extremity Assessment: Overall WFL for tasks assessed   Cervical / Trunk Assessment Cervical / Trunk Assessment: Normal   Communication Communication Communication: Expressive difficulties   Cognition Arousal/Alertness: Awake/alert Behavior During Therapy: WFL for tasks assessed/performed Overall Cognitive Status: Impaired/Different from baseline Area of Impairment: Orientation;Following commands;Safety/judgement;Problem solving;Attention;Memory;Awareness                 Orientation Level: Disoriented to;Time;Situation Current Attention Level: Selective Memory: Decreased short-term memory;Decreased recall of precautions(attempted 3 word recall without success) Following Commands: Follows one step commands consistently;Follows multi-step commands inconsistently;Follows multi-step commands with increased time Safety/Judgement: Decreased awareness of safety;Decreased awareness of deficits Awareness: Intellectual Problem Solving: Slow processing;Requires verbal cues General Comments: Pt with word finding difficulties and appearing to perseverate on numbers; answering multiple questions with  inappropriate number answers. Unable to locate sink without maximum cues.    General Comments  Pt very confused     Exercises     Shoulder Instructions      Home Living Family/patient expects to be discharged to:: Private residence Living Arrangements: Other relatives Available  Help at Discharge: (P) Family;Available 24 hours/day Type of Home: Mobile home Home Access: Stairs to enter Entrance Stairs-Number of Steps: 3 Entrance Stairs-Rails: Can reach both Home Layout: One level     Bathroom Shower/Tub: Chief Strategy OfficerTub/shower unit   Bathroom Toilet: Standard Bathroom Accessibility: Yes   Home Equipment: Environmental consultantWalker - 2 wheels;Gilmer MorCane - single point      Lives With: (P) Family    Prior Functioning/Environment Level of Independence: Independent                 OT Problem List: Impaired vision/perception;Decreased safety awareness;Decreased knowledge of use of DME or AE;Decreased cognition;Decreased knowledge of precautions      OT Treatment/Interventions: Self-care/ADL training;Therapeutic activities;Cognitive remediation/compensation;Visual/perceptual remediation/compensation;Patient/family education    OT Goals(Current goals can be found in the care plan section) Acute Rehab OT Goals Patient Stated Goal: to go home  OT Goal Formulation: With patient Time For Goal Achievement: 08/22/18 Potential to Achieve Goals: Good ADL Goals Pt Will Perform Grooming: with modified independence;standing Pt Will Transfer to Toilet: with modified independence;ambulating Additional ADL Goal #1: Pt will complete multi-step grooming task independently with increased time for processing. Additional ADL Goal #2: Pt will demonstrate emergent awareness throughout morning ADL routine with minimal cues.  OT Frequency: Min 2X/week   Barriers to D/C:            Co-evaluation              AM-PAC OT "6 Clicks" Daily Activity     Outcome Measure Help from another person eating meals?: A Little Help from another person taking care of personal grooming?: A Little Help from another person toileting, which includes using toliet, bedpan, or urinal?: A Little Help from another person bathing (including washing, rinsing, drying)?: A Little Help from another person to put on and taking  off regular upper body clothing?: A Little Help from another person to put on and taking off regular lower body clothing?: A Little 6 Click Score: 18   End of Session Equipment Utilized During Treatment: Gait belt Nurse Communication: Mobility status  Activity Tolerance: Patient tolerated treatment well Patient left: in chair;with call bell/phone within reach  OT Visit Diagnosis: Other abnormalities of gait and mobility (R26.89);Cognitive communication deficit (R41.841) Symptoms and signs involving cognitive functions: Cerebral infarction                Time: 1250-1305 OT Time Calculation (min): 15 min Charges:  OT General Charges $OT Visit: 1 Visit OT Evaluation $OT Eval Moderate Complexity: 1 [address]Mod  Burnetta Kohls Anne ByramByrum, OTR/L Acute Rehabilitation Services Office [phone]   Marlisha Vanwyk A Sola Margolis 08/08/2018, 1:54 PM

## 2018-08-08 NOTE — Evaluation (Signed)
Speech Language Pathology Evaluation Patient Details Name: [name] Cruz MRN: [id number] DOB: [birth year] Today's Date: 08/08/2018 Time: [id number] SLP Time Calculation (min) (ACUTE ONLY): 31 min  Problem List:  Patient Active Problem List   Diagnosis Date Noted  . CVA (cerebral vascular accident) (HCC) 08/08/2018  . Acute CVA (cerebrovascular accident) (HCC) 08/07/2018  . Cerebellar stroke, acute (HCC) 07/10/2018  . Status epilepticus (HCC) 03/25/2018  . Hyperkalemia 10/03/2017  . Unilateral occipital headache 10/03/2017  . Ischemic stroke (HCC) 08/23/2017  . Coronary artery disease involving coronary bypass graft of native heart without angina pectoris   . Atrial fibrillation (HCC)   . Dysphagia, post-stroke   . Cardiac arrest (HCC) 08/09/2017  . S/P CABG x 3 02/24/2017  . NSTEMI (non-ST elevated myocardial infarction) (HCC) 02/21/2017  . Encephalomalacia 10/28/2012  . Seizure disorder (HCC) 04/26/2012  . Alcohol abuse 04/26/2012  . History of stroke with current residual effects   . Cognitive impairment   . HLD (hyperlipidemia)   . HTN (hypertension)    Past Medical History:  Past Medical History:  Diagnosis Date  . Acute deep vein thrombosis (DVT) of left upper extremity (HCC)    L brachial and basilic veins, eliquis started 08/22/2017 to continue for 3 months total   . Cognitive impairment 2007   after stroke, saw rehab but told to stop because was too upsetting to him  . History of chicken pox   . HLD (hyperlipidemia)   . HTN (hypertension)   . NSTEMI (non-ST elevated myocardial infarction) (HCC) 02/21/2017  . Obesity   . Stroke, hemorrhagic (HCC) 2007   thought 2/2 HTN (240sbp); residual cognitive impairment, loss of R peripheral field, no driving   Past Surgical History:  Past Surgical History:  Procedure Laterality Date  . ANKLE SURGERY  1990s   right foot with plate and screws  . CORONARY ARTERY BYPASS GRAFT N/A 02/24/2017   3v Procedure: CORONARY ARTERY BYPASS  GRAFTING (CABG) x 3 ON PUMP USING LEFT INTERNAL MAMMARY ARTERY TO LEFT ANTERIOR DESENDING CORNARY ARTERY, RIGHT GREATER SAPHENOUS VEIN TO LEFT CIRCUMFLEX ARTERY AND POSTERIOR DESENDING ARTERY. RIGHT GREATER SAPHENOUS VEIN OBTAINED VIA ENDOVEIN HARVEST.;  Surgeon: Delight OvensGerhardt, Edward B, MD  . IABP INSERTION N/A 02/21/2017   Procedure: IABP Insertion;  Surgeon: Yvonne KendallEnd, Christopher, MD;  Location: [facility];  Service: Cardiovascular;  Laterality: N/A;  . LEFT HEART CATH AND CORONARY ANGIOGRAPHY N/A 02/21/2017   Procedure: LEFT HEART CATH AND CORONARY ANGIOGRAPHY;  Surgeon: Yvonne KendallEnd, Christopher, MD;  Location: [facility];  Service: Cardiovascular;  Laterality: N/A;  . TEE WITHOUT CARDIOVERSION N/A 02/24/2017   Procedure: TRANSESOPHAGEAL ECHOCARDIOGRAM (TEE);  Surgeon: Delight OvensGerhardt, Edward B, MD;  [facility];  Service: Open Heart Surgery;  Laterality: N/A;   HPI:  [name] Cruz, 63y/o male presented to ED with sudden onset of aphasia, dysarthria and facial droop. PMH of CAD, status post CABG in 2019, multiple CVAs in the past, atrial fibrillation, seizures, ICH, hypertensions, hyperlipidemia, [facility] acute CVA in last June, 2020   Assessment / Plan / Recommendation Clinical Impression  Patient has a history of CVA's with most recent June 2020. At that time his cognitive-liguistic skills were assesses and he was found to have receptive and expressive language deficits related to auditory comprehension, complex yes/no questios and 2-step commands. Today his evaluation was found to be consistent with his prior level of abilities/deficits from June of this year. With continued support from his sisters/family  for medication administration and financial assistance SLP services are not clinically indicated at this time. Patient educated and nurse notified of findings and agree with recommendations.     SLP Assessment  SLP Recommendation/Assessment:  Patient does not need any further Speech Lanaguage Pathology Services SLP Visit Diagnosis: Aphasia (R47.01)    Follow Up Recommendations  None               SLP Evaluation Cognition  Overall Cognitive Status: Impaired/Different from baseline Arousal/Alertness: Awake/alert Orientation Level: Oriented to person;Oriented to place;Oriented to situation Attention: Focused;Sustained Focused Attention: Appears intact Sustained Attention: Appears intact Memory: Impaired Memory Impairment: Retrieval deficit;Decreased recall of new information Awareness: Appears intact Problem Solving: Appears intact Executive Function: Self Monitoring Self Monitoring: Appears intact Safety/Judgment: Appears intact       Comprehension  Auditory Comprehension Overall Auditory Comprehension: Impaired at baseline Yes/No Questions: Within Functional Limits Basic Biographical Questions: 76-100% accurate Complex Questions: 50-74% accurate One Step Basic Commands: 50-74% accurate Two Step Basic Commands: 25-49% accurate Reading Comprehension Reading Status: Within funtional limits    Expression Expression Primary Mode of Expression: Verbal Verbal Expression Overall Verbal Expression: Impaired at baseline Initiation: No impairment Level of Generative/Spontaneous Verbalization: Sentence;Conversation Repetition: No impairment Naming: No impairment Responsive: 51-75% accurate Confrontation: Impaired Convergent: 25-49% accurate Pragmatics: No impairment Interfering Components: Attention;Premorbid deficit Effective Techniques: Open ended questions Written Expression Dominant Hand: Right   Oral / Motor  Oral Motor/Sensory Function Overall Oral Motor/Sensory Function: Within functional limits Motor Speech Overall Motor Speech: Appears within functional limits for tasks assessed Respiration: Within functional limits Phonation: Normal Resonance: Within functional limits Articulation: Within  functional limitis Intelligibility: Intelligible Motor Planning: Witnin functional limits Motor Speech Errors: Not applicable   GO                   Charlynne Cousins Kyl Givler, MA, CCC-SLP 08/08/2018 2:03 PM

## 2018-08-08 NOTE — Evaluation (Signed)
Physical Therapy Evaluation Patient Details Name: [name] Cruz MRN: [id number] DOB: 1955-10-16 Today's Date: 08/08/2018   History of Present Illness  Patient is a 63 y/o male presenting to the ED on 08/07/2018 with primary complaints of aphasia, dysarthria and facial droop. CT with No evidence of acute intracranial abnormality. MRI with 4 mm acute/subacute white matter infarct involving right forceps major. Past medical history of CAD, status post CABG in 2019, multiple CVAs in the past, atrial fibrillation, seizures, ICH, hypertensions, hyperlipidemia.     Clinical Impression  Patient admitted with the above. Patient reports Mod I with mobility and ADLs prior to admission with family available to assist as needed. Some confusion in session with orientation questions with mild impulsivity with mobility. Ambulating in hallway with general supervision for safety with use of RW. Patient up in chair with all needs met at end of session. Will follow acutely.      Follow Up Recommendations No PT follow up;Supervision - Intermittent    Equipment Recommendations  None recommended by PT    Recommendations for Other Services       Precautions / Restrictions Precautions Precautions: Fall Restrictions Weight Bearing Restrictions: No      Mobility  Bed Mobility Overal bed mobility: Modified Independent                Transfers Overall transfer level: Needs assistance Equipment used: Rolling walker (2 wheeled) Transfers: Sit to/from Stand Sit to Stand: Supervision         General transfer comment: for safety and immediate standing balance  Ambulation/Gait Ambulation/Gait assistance: Supervision Gait Distance (Feet): 250 Feet Assistive device: Rolling walker (2 wheeled) Gait Pattern/deviations: Step-through pattern;Decreased stride length Gait velocity: WNL   General Gait Details: steady gait pattern - supervision for safety; no LOB  Stairs            Wheelchair  Mobility    Modified Rankin (Stroke Patients Only) Modified Rankin (Stroke Patients Only) Pre-Morbid Rankin Score: No symptoms Modified Rankin: No significant disability     Balance Overall balance assessment: Mild deficits observed, not formally tested                                           Pertinent Vitals/Pain Pain Assessment: No/denies pain    Home Living Family/patient expects to be discharged to:: Private residence Living Arrangements: Other relatives Available Help at Discharge: Family;Friend(s);Available 24 hours/day Type of Home: Mobile home Home Access: Stairs to enter Entrance Stairs-Rails: Can reach both Entrance Stairs-Number of Steps: 3 Home Layout: One level Home Equipment: Walker - 2 wheels;Cane - single point      Prior Function Level of Independence: Independent               Hand Dominance   Dominant Hand: Right    Extremity/Trunk Assessment   Upper Extremity Assessment Upper Extremity Assessment: Defer to OT evaluation    Lower Extremity Assessment Lower Extremity Assessment: Overall WFL for tasks assessed    Cervical / Trunk Assessment Cervical / Trunk Assessment: Normal  Communication   Communication: Expressive difficulties  Cognition Arousal/Alertness: Awake/alert Behavior During Therapy: WFL for tasks assessed/performed Overall Cognitive Status: Impaired/Different from baseline Area of Impairment: Orientation;Following commands;Safety/judgement;Problem solving                 Orientation Level: Disoriented to;Time;Situation     Following Commands: Follows one step commands  consistently;Follows multi-step commands inconsistently;Follows multi-step commands with increased time Safety/Judgement: Decreased awareness of safety;Decreased awareness of deficits   Problem Solving: Slow processing;Requires verbal cues General Comments: word finding difficulty; unable to state names of the month wihtout  prompting      General Comments General comments (skin integrity, edema, etc.): needs to have cognition further assessed    Exercises     Assessment/Plan    PT Assessment Patient needs continued PT services  PT Problem List Decreased strength;Decreased activity tolerance;Decreased balance;Decreased mobility;Decreased coordination;Decreased safety awareness;Decreased knowledge of use of DME       PT Treatment Interventions DME instruction;Gait training;Stair training;Functional mobility training;Therapeutic activities;Therapeutic exercise;Balance training;Neuromuscular re-education;Patient/family education    PT Goals (Current goals can be found in the Care Plan section)  Acute Rehab PT Goals Patient Stated Goal: to go home  PT Goal Formulation: With patient Time For Goal Achievement: 08/22/18 Potential to Achieve Goals: Good    Frequency Min 4X/week   Barriers to discharge        Co-evaluation               AM-PAC PT "6 Clicks" Mobility  Outcome Measure Help needed turning from your back to your side while in a flat bed without using bedrails?: None Help needed moving from lying on your back to sitting on the side of a flat bed without using bedrails?: None Help needed moving to and from a bed to a chair (including a wheelchair)?: A Little Help needed standing up from a chair using your arms (e.g., wheelchair or bedside chair)?: A Little Help needed to walk in hospital room?: A Little Help needed climbing 3-5 steps with a railing? : A Little 6 Click Score: 20    End of Session Equipment Utilized During Treatment: Gait belt Activity Tolerance: Patient tolerated treatment well Patient left: in chair;with call bell/phone within reach;with chair alarm set Nurse Communication: Mobility status PT Visit Diagnosis: Unsteadiness on feet (R26.81);Other abnormalities of gait and mobility (R26.89);Muscle weakness (generalized) (M62.81)    Time: 8841-6606 PT Time  Calculation (min) (ACUTE ONLY): 21 min   Charges:   PT Evaluation $PT Eval Moderate Complexity: 1 Mod          Lanney Gins, PT, DPT Supplemental Physical Therapist 08/08/18 1:00 PM Pager: [phone] Office: [phone]

## 2018-08-08 NOTE — Progress Notes (Addendum)
STROKE TEAM PROGRESS NOTE   SUBJECTIVE (INTERVAL HISTORY) He is sitting in chair, talkative, however intermittent word finding difficulty.  He knows he had a stroke in the past and also had a seizure on medication.  He cannot tell what event leading him to this hospitalization.  Regular rhythm on telemetry, no A. fib at this time.   OBJECTIVE Vitals:   08/08/18 0016 08/08/18 0416 08/08/18 0605 08/08/18 0827  BP: 116/77 123/75 97/84 119/75  Pulse:  62  69  Resp: 10 18 17 12   Temp:  98.4 F (36.9 C) 98.5 F (36.9 C) 98.6 F (37 C)  TempSrc:  Oral Oral Oral  SpO2: 94% 98% 97% 98%  Weight:      Height:        CBC:  Recent Labs  Lab 08/07/18 1200 08/07/18 1202  WBC 6.3  --   NEUTROABS 4.0  --   HGB 12.6* 12.6*  HCT 38.6* 37.0*  MCV 93.7  --   PLT 187  --     Basic Metabolic Panel:  Recent Labs  Lab 08/07/18 1200 08/07/18 1202  NA 139 138  K 4.4 4.3  CL 104 101  CO2 27  --   GLUCOSE 101* 97  BUN <5* 5*  CREATININE 0.74 0.70  CALCIUM 8.7*  --     Lipid Panel:     Component Value Date/Time   CHOL 120 07/09/2018 0416   CHOL 177 07/11/2017 0950   TRIG 78 07/09/2018 0416   HDL 35 (L) 07/09/2018 0416   HDL 96 07/11/2017 0950   CHOLHDL 3.4 07/09/2018 0416   VLDL 16 07/09/2018 0416   LDLCALC 69 07/09/2018 0416   LDLCALC 65 07/11/2017 0950   HgbA1c:  Lab Results  Component Value Date   HGBA1C 5.6 07/09/2018   Urine Drug Screen:     Component Value Date/Time   LABOPIA NONE DETECTED 08/07/2018 2216   COCAINSCRNUR NONE DETECTED 08/07/2018 2216   LABBENZ NONE DETECTED 08/07/2018 2216   AMPHETMU NONE DETECTED 08/07/2018 2216   THCU NONE DETECTED 08/07/2018 2216   LABBARB NONE DETECTED 08/07/2018 2216    Alcohol Level     Component Value Date/Time   ETH <10 08/07/2018 1200    IMAGING   Mr Brain Wo Contrast 08/07/2018 IMPRESSION:  1. 4 mm acute/subacute white matter infarct involving right forceps major.  2. Moderate chronic small vessel ischemic  disease.  3. Remote left temporoparietal hemorrhage with resultant encephalomalacia.  4. Multiple chronic cerebral microhemorrhages which could reflect cerebral amyloid angiopathy or hypertension.   Ct Head Code Stroke Wo Contrast  08/07/2018 IMPRESSION:  1. No evidence of acute intracranial abnormality.  2. ASPECTS is 10.  3. Chronic ischemia and encephalomalacia as above.   CTA Head and Neck 07/09/2018 IMPRESSION: 1. No emergent large vessel occlusion. 2. 60% stenosis of the proximal right internal carotid artery due to mixed density atherosclerosis. 3. Left carotid bifurcation atherosclerosis with less than 50% stenosis of the proximal left internal carotid artery.   Transthoracic Echocardiogram  07/09/2018 IMPRESSIONS  1. The left ventricle has normal systolic function, with an ejection fraction of 55-60%. The cavity size was normal. Left ventricular diastolic parameters were normal.  2. The right ventricle has normal systolic function. The cavity was normal. There is no increase in right ventricular wall thickness.  3. Mitral valve regurgitation is mild to moderate by color flow Doppler.  4. The aortic valve is tricuspid. Mild thickening of the aortic valve. Moderate calcification of the aortic  valve.  5. The interatrial septum was not assessed.   ECG - SR rate 76 BPM. (See cardiology reading for complete details)   PHYSICAL EXAM  Temp:  [97.9 F (36.6 C)-98.6 F (37 C)] 98.5 F (36.9 C) (07/25 1344) Pulse Rate:  [61-70] 70 (07/25 1344) Resp:  [10-18] 15 (07/25 1344) BP: (97-152)/(63-95) 152/95 (07/25 1344) SpO2:  [93 %-100 %] 99 % (07/25 1344)  General - Well nourished, well developed, in no apparent distress.  Ophthalmologic - fundi not visualized due to noncooperation.  Cardiovascular - Regular rate and rhythm, not in afib.  Mental Status -  Level of arousal and orientation to DOB, place, and person were intact, not orientated to time. Language examination  showed intermittent paraphasic errors and word finding difficulty.  Naming 0/5 and difficulty with repetition.  Follows all simple commands  Cranial Nerves II - XII - II - right homonymous hemianopia III, IV, VI - Extraocular movements intact. V - Facial sensation intact bilaterally. VII - Facial movement intact bilaterally. VIII - Hearing & vestibular intact bilaterally. X - Palate elevates symmetrically. XI - Chin turning & shoulder shrug intact bilaterally. XII - Tongue protrusion intact.  Motor Strength - The patient's strength was normal in all extremities and pronator drift was absent.  Bulk was normal and fasciculations were absent.   Motor Tone - Muscle tone was assessed at the neck and appendages and was normal.  Reflexes - The patient's reflexes were symmetrical in all extremities and he had no pathological reflexes.  Sensory - Light touch, temperature/pinprick were assessed and were symmetrical.    Coordination - The patient had normal movements in the hands with no ataxia or dysmetria.  Tremor was absent.  Gait and Station - deferred.   ASSESSMENT/PLAN [name] Cruz is a 63 y.o. male with history of CAD s/p CABG in 02/24/2017, multiple hemorrhagic strokes with subsequent cognitive impairment, DVT in 2019, seizure disorder, HTN, HLD, who presented to the ED via EMS due to sudden onset aphasia, dysarthria and facial droop which resolved prior to admission. He did not receive IV t-PA due to resolution of deficits.   Stroke: punctate acute/subacute white matter infarct involving right forceps major, right PCA territory, likely due to small vessel disease.   CT head - no acute findings.  MRI head - 4 mm acute/subacute white matter infarct involving right forceps major. Remote left temporoparietal hemorrhage with resultant encephalomalacia.   CTA H&N - 07/09/18 - 60% stenosis of the proximal right internal carotid artery   2D Echo - 07/09/18  - EF 55 - 60%. No cardiac  source of emboli identified.  Sars Corona Virus 2 - negative  LDL - 07/09/18 - 65  HgbA1c - 07/09/18 - 5.6  UDS - negative  VTE prophylaxis - [place] Heparin  Diet  - Heart healthy with thin liquids  aspirin 325 mg daily prior to admission, now on aspirin 81 mg daily and clopidogrel 75 mg daily.  Continue DAPT for 3 weeks and then Plavix alone.  Patient counseled to be compliant with his antithrombotic medications  Ongoing aggressive stroke risk factor management  Therapy recommendations:  No f/u PT recommended  Disposition:  Pending  Seizure   Has multiple admission in the past due to seizure activity  Likely related to previous left MCA infarct  Currently on Keppra 1500 twice daily and Vimpat 100/200  Current episode for admission concerning for seizure activity but not conclusive  Continue home AEDs  Follow-up with neurology as outpatient  History of stroke  Left MCA infarct with hemorrhagic conversion in 2004  06/2018 incidental right cerebellum infarct secondary to small vessel disease.  CT head and neck right ICA 60% stenosis.  Left ICA less than 50% stenosis.  LDL 69 and A1c 5.6.  Increase aspirin from 81 to 325.  Lipitor 80 continued.  [facility]F not on [facility]  As per cardiology note, patient has PAF  [facility] due to history of left MCA infarct with hemorrhagic conversion  Current stroke as well as stroke in 06/2018 likely small vessel disease, currently no A. fib on telemetry, will continue DAPT.  However, patient need to close follow-up with Dr. Pearlean BrownieSethi to consider anticoagulation for stroke prevention if felt appropriate.  Hypertension  Blood pressure somewhat low at times  . Permissive hypertension (OK if < 180/105) but gradually normalize in 5-7 days . Long-term BP goal normotensive  Hyperlipidemia  Lipid lowering medication PTA:  Lipitor 80 mg daily  LDL 65, goal < 70  Current lipid lowering medication: Lipitor 80 mg daily  Continue statin at  discharge  Other Stroke Risk Factors  Advanced age  Previous smokeless tobacco use - quit  Previous ETOH use  Coronary artery disease s/p CABG  Hx of UE DVT  Other Active Problems  Baseline cognitive impairment  Hospital day # 1  Neurology will sign off. Please call with questions. Pt will [facility] stroke clinic [facility] 4 weeks. Thanks for the consult.  Marvel PlanJindong Nicha Hemann, MD PhD Stroke Neurology 08/08/2018 4:18 PM  I spent  35 minutes in total face-to-face time with the patient, more than 50% of which was spent in counseling and coordination of care, reviewing test results, images and medication, and discussing the diagnosis of stroke, history of stroke, seizure disorder, PAF, not on AC, treatment plan and potential prognosis. This patient's care requiresreview of multiple databases, neurological assessment, discussion with family, other specialists and medical decision making of high complexity.   To contact Stroke Continuity provider, please refer to [email or web address]. After hours, contact General Neurology

## 2018-08-10 ENCOUNTER — Telehealth: Payer: Self-pay

## 2018-08-10 ENCOUNTER — Telehealth: Payer: Self-pay | Admitting: Neurology

## 2018-08-10 ENCOUNTER — Telehealth: Payer: Self-pay | Admitting: *Deleted

## 2018-08-10 MED ORDER — VIMPAT 100 MG PO TABS: ORAL_TABLET | ORAL | 4 refills | Status: DC

## 2018-08-10 MED ORDER — CLOPIDOGREL BISULFATE 75 MG PO TABS: 75.0000 mg | ORAL_TABLET | Freq: Every day | ORAL | 1 refills | Status: AC

## 2018-08-10 NOTE — Telephone Encounter (Signed)
plavix sent to [facility]. Would have her check on cost prior to having them fill.

## 2018-08-10 NOTE — Addendum Note (Signed)
Addended by: Ria Bush on: 08/10/2018 05:16 PM   Modules accepted: Orders

## 2018-08-10 NOTE — Telephone Encounter (Signed)
Spoke with pt's mother, Dot (on dpr), relaying Dr. Synthia Innocent message.  Verbalizes understanding.  However, she requests Plavix rx be sent to [facility].

## 2018-08-10 NOTE — Telephone Encounter (Signed)
We can sometimes see worsening depression/mood after major event like stroke - this could be why he's crying more recently. He is on very low sertraline dose (1/2 tab) - would offer to increase to full tablet 25mg  daily to see if mood would improve with higher dose.  Regarding expensive medicine - anticipate it was plavix (clopidogrel) blood thinner - he should be on both aspirin and plavix.  Reviewing [facility], there is a coupon available that will lower price of generic plavix to $38 for 3 months. Other options are [facility] $10, [facility] $15 and [facility] $16 through [facility] for 3 months.

## 2018-08-10 NOTE — Telephone Encounter (Signed)
Left message to complete TCM call and make an appointment to follow up if needed.

## 2018-08-10 NOTE — Telephone Encounter (Signed)
Patient's mom called stating that he got out of the hospital Saturday because he had a stroke. Patient's mom Dot stated that patient has been crying off and on since he got out of the hospital. Dot stated that they feel that he is on too much medication. Patient's mom stated that he gets real agitated and starts crying.and states that he does not know why he is crying. Patient's mom stated that the hospital sent a script to the pharmacy that cost $1600 and they can not afford that, but she does not know the name of the medication. Patient's mom stated that the pharmacy told her that they were going to send a request over to his doctor to see if he could change it to something less expensive.  [facility]

## 2018-08-10 NOTE — Telephone Encounter (Signed)
Patient's sister called, require vimpat 100mg  1 in the morning /2 at night to be called to [facility], I have ERx, 270 tabs with 4 refills.

## 2018-08-11 NOTE — Telephone Encounter (Signed)
Transition Care Management Follow-up Telephone Call Spoke with sister, [name], on behalf of patient. Ok per [employer] on file.  Date discharged? 08/08/2018   How have you been since you were released from the hospital? (see other message) Had some issues with crying and been emotional on 08/09/2018. Medication changes have been made. Patient seems to be better today.   Do you understand why you were in the hospital? Not really, not able to comprehend.   Do you understand the discharge instructions? Not really, not able to comprehend.   Where were you discharged to? Home with one of his sisters.   Items Reviewed:  Medications reviewed: yes  Allergies reviewed: yes  Dietary changes reviewed: yes  Referrals reviewed: yes   Functional Questionnaire:   Activities of Daily Living (ADLs):   He states they are independent in the following:ambulation, feeding, dressing, toileting, hygiene, bathing. States they require assistance with the following: none   Any transportation issues/concerns?: no   Any patient concerns? Not at this time  Confirmed importance and date/time of follow-up visits scheduled not at this time. Confirmed with patient if condition begins to worsen call PCP or go to the ER.  Patient was given the office number and encouraged to call back with question or concerns.  :yes

## 2018-08-11 NOTE — Telephone Encounter (Signed)
Not quite clear what she means - I sent in plavix to take 75mg  one daily #90 for 3 month supply. Should only be taking one a day.

## 2018-08-11 NOTE — Telephone Encounter (Signed)
[name] Cruz (DPR signed) notified as instructed and [name] Cruz voiced understanding.

## 2018-08-11 NOTE — Telephone Encounter (Signed)
Pt's mother, Dot, called back regarding medication. She said [facility] will only cover 2 per day of Plavix and prescription was written 3 per day. She is requesting a call back.

## 2018-08-11 NOTE — Telephone Encounter (Signed)
Patient's mom [name] Cruz notified as instructed by telephone and verbalized understanding. Advised Mrs. Nicole Kindred this script is one a Training and development officer and that is the way it was sent to the pharmacy,. [name] Cruz said that she is going to call [facility] and make sure that they got the script and it is one a day.

## 2018-08-11 NOTE — Telephone Encounter (Signed)
Dr. Darnell Level, patient is going to be following up with neurologist and wanted to see if he needs a follow up with you also? If so virtual or in office? Call back to [place], patient's daughter, to let her know.

## 2018-08-11 NOTE — Telephone Encounter (Signed)
Spoke with pt's sister, [name] (on dpr), relaying Dr. Synthia Innocent message.  Verbalizes understanding.

## 2018-08-13 ENCOUNTER — Telehealth: Payer: Self-pay | Admitting: Family Medicine

## 2018-08-13 NOTE — Telephone Encounter (Signed)
Left message for [name], patient's sister, to call back.

## 2018-08-13 NOTE — Telephone Encounter (Signed)
Spoke with [name] and she said neurology appointment is not set up yet. Called Diane and scheduled phone visit for 08/18/2018 with Dr. Darnell Level. They do not have Internet service at the house.

## 2018-08-13 NOTE — Telephone Encounter (Signed)
Spoke with Raven of [employer] discussing Vimpat.  Stated they received another rx on 08/10/18 from another provider.  Says everything has been taken care of.

## 2018-08-13 NOTE — Telephone Encounter (Signed)
Nicole Kindred with [employer] is calling in regards to a PA they are processing for the patient's VIMPAT . Patient is needing a 90 day supply. Nicole Kindred is requesting a call back to gather more information on the patient and try to get this processed for him. She stated that the patient only has a few days left of this medication    Phone- [phone] [facility]

## 2018-08-13 NOTE — Telephone Encounter (Addendum)
How soon is he able to see neurology? I don't see f/u appt scheduled yet.  Would offer virtual hospital f/u with me.

## 2018-08-17 ENCOUNTER — Telehealth: Payer: Self-pay

## 2018-08-17 NOTE — Telephone Encounter (Signed)
PA done via [facility]. I was unable to do on cover my meds due to not recognizing pts demographic. PA form and office notes since 2019 were fax. Fax twice and confirmed x2.

## 2018-08-18 ENCOUNTER — Other Ambulatory Visit: Payer: Self-pay | Admitting: Adult Health

## 2018-08-18 ENCOUNTER — Ambulatory Visit (INDEPENDENT_AMBULATORY_CARE_PROVIDER_SITE_OTHER): Payer: Medicare Other | Admitting: Family Medicine

## 2018-08-18 ENCOUNTER — Encounter: Payer: Self-pay | Admitting: Family Medicine

## 2018-08-18 DIAGNOSIS — I639 Cerebral infarction, unspecified: Secondary | ICD-10-CM

## 2018-08-18 DIAGNOSIS — Z8673 Personal history of transient ischemic attack (TIA), and cerebral infarction without residual deficits: Secondary | ICD-10-CM | POA: Diagnosis not present

## 2018-08-18 DIAGNOSIS — R4189 Other symptoms and signs involving cognitive functions and awareness: Secondary | ICD-10-CM

## 2018-08-18 DIAGNOSIS — E782 Mixed hyperlipidemia: Secondary | ICD-10-CM

## 2018-08-18 DIAGNOSIS — I10 Essential (primary) hypertension: Secondary | ICD-10-CM

## 2018-08-18 DIAGNOSIS — G40909 Epilepsy, unspecified, not intractable, without status epilepticus: Secondary | ICD-10-CM

## 2018-08-18 MED ORDER — SERTRALINE HCL 25 MG PO TABS: 25.0000 mg | ORAL_TABLET | Freq: Every day | ORAL | 3 refills | Status: DC

## 2018-08-18 MED ORDER — LEVETIRACETAM 750 MG PO TABS: 1500.0000 mg | ORAL_TABLET | Freq: Two times a day (BID) | ORAL | 0 refills | Status: DC

## 2018-08-18 MED ORDER — VIMPAT 100 MG PO TABS: ORAL_TABLET | ORAL | 0 refills | Status: DC

## 2018-08-18 NOTE — Telephone Encounter (Signed)
PA approved for the pt for vimpat. Approved from 08/17/2018-01/14/2019. [id number]. Approved through [facility]

## 2018-08-18 NOTE — Assessment & Plan Note (Signed)
Fully out of vimpat and keppra - still awaiting meds from [facility]. I have sent in 30 d supply to local pharmacy and advised sister to pick up today to restart.

## 2018-08-18 NOTE — Assessment & Plan Note (Signed)
Recurrent acute ischemic stroke. Reviewed with pt/sister difficult situation - difficult to determine what the correct treatment is with h/o microhemorrages but also with ongoing ischemic events. Plavix most recently added - will continue this. I did encourage neuro f/u - sister will call to schedule this.

## 2018-08-18 NOTE — Progress Notes (Signed)
[name] Cruz - 63 y.o. male  MRN [id number]  Date of Birth: 22-Sep-1955  PCP: Ria Bush, MD  This service was provided via telemedicine. Phone Visit performed on 08/18/2018    Rationale for phone visit along with limitations reviewed. Patient consented to telephone encounter.    Location of patient: at home Location of provider: in office, [place] [facility] Name of referring provider: N/A   Names of persons and role in encounter: Provider: Ria Bush, MD  Patient: [name] Cruz  Other: [name] Cruz - sister   Time on call: 12:17pm - 12:32pm - not including prep of chart before and after visit.   Subjective: Chief Complaint  Patient presents with  . Hospitalization Follow-up    HPI:  Known seizure disorder and h/o intracranial hemorrhage. Recently has had ongoing trouble with recurrent seizures and strokes including evidence of microhemorrhages s/p multiple hospitalizations. Also has extensive cardiac history s/p CABG 2019. Latest hospitalization occurred late last month while at restaurant - presented with sudden onset aphasia, dysarthria, facial droop. Plavix was added to blood thinner regimen.   Latest MRI - 4 mm acute/subacute white matter infarct involving the right forceps major. Moderate chronic small vessel ischemic changes. Old left temporal parietal hemorrhage with resultant encephalomalacia. Multiple chronic cerebral micro-hemorrhages.   Increased emotional lability when he came home, this improved on higher sertraline dose (25mg  daily).  Patient denies headaches, chest pain, dyspnea, slurred speech, extremity weakness, dysphagia, dizziness.   Sister monitors blood pressures closely:  1 low BP reading last week 90/72, HR 80.  Highest BP reading last week 151/79, HR 59.  Otherwise running well controlled 086-578 systolic.   Has not yet scheduled neurology f/u - advised call for appt.  He has completely run out of vimpat and keppra - still  awaiting [facility].    TCM hosp f/u phone call completed 08/11/2018 Admit date: 08/07/2018 Discharge date: 08/08/2018  Admitted From: Home  Disposition:  Home   Recommendations for Outpatient Follow-up and new medication changes:  1. Follow up with Dr. Dionisio Paschal in 7 days.  2. Follow up with Neurology as scheduled. 3. Patient has been placed on clopidogrel and asa.   Home Health: no   Equipment/Devices: no    Discharge Condition: stable  CODE STATUS: full  Diet recommendation: heart healthy and diabetic prudent.   Discharge Diagnoses:  Principal Problem:   Ischemic stroke ([place]) Active Problems:   HLD (hyperlipidemia)   HTN (hypertension)   Seizure disorder ([place])   Acute CVA (cerebrovascular accident) ([place])   CVA (cerebral vascular accident) ([place])   Objective/Observations:  No physical exam or vital signs collected unless specifically identified below.   BP 121/72   Pulse 60   Ht 5\' 3"  (1.6 m)   BMI 31.01 kg/m    Respiratory status: speaks in complete sentences without evident shortness of breath.   Lab Results  Component Value Date   CREATININE 0.70 08/07/2018   BUN 5 (L) 08/07/2018   NA 138 08/07/2018   K 4.3 08/07/2018   CL 101 08/07/2018   CO2 27 08/07/2018     Assessment/Plan:  Acute CVA (cerebrovascular accident) ([place]) Recurrent acute ischemic stroke. Reviewed with pt/sister difficult situation - difficult to determine what the correct treatment is with h/o microhemorrages but also with ongoing ischemic events. Plavix most recently added - will continue this. I did encourage neuro f/u - sister will call to schedule this.   Cognitive impairment I spoke with both patient and his sister  who helps manage his medical care.   HTN (hypertension) BP well controlled today, largely well controlled at home.   Ischemic stroke (HCC) See above.   HLD (hyperlipidemia) Continue high dose atorvastatin.  The ASCVD Risk score Denman George(Goff DC Jr., et al., 2013)  failed to calculate for the following reasons:   The patient has a prior MI or stroke diagnosis   Seizure disorder (HCC) Fully out of vimpat and keppra - still awaiting meds from [facility]. I have sent in 30 d supply to local pharmacy and advised sister to pick up today to restart.   I discussed the assessment and treatment plan with the patient. The patient was provided an opportunity to ask questions and all were answered. The patient agreed with the plan and demonstrated an understanding of the instructions.  Lab Orders  No laboratory test(s) ordered today    Meds ordered this encounter  Medications  . levETIRAcetam (KEPPRA) 750 MG tablet    Sig: Take 2 tablets (1,500 mg total) by mouth 2 (two) times daily.    Dispense:  120 tablet    Refill:  0  . Lacosamide (VIMPAT) 100 MG TABS    Sig: Take 1 tablet in the morning and 2 at night (total 300mg  daily)    Dispense:  90 tablet    Refill:  0  . sertraline (ZOLOFT) 25 MG tablet    Sig: Take 1 tablet (25 mg total) by mouth at bedtime.    Dispense:  90 tablet    Refill:  3    The patient was advised to call back or seek an in-person evaluation if the symptoms worsen or if the condition fails to improve as anticipated.  Eustaquio BoydenJavier Jocilyn Trego, MD

## 2018-08-18 NOTE — Assessment & Plan Note (Signed)
BP well controlled today, largely well controlled at home.

## 2018-08-18 NOTE — Assessment & Plan Note (Signed)
Continue high dose atorvastatin. The ASCVD Risk score (Goff DC Jr., et al., 2013) failed to calculate for the following reasons:   The patient has a prior MI or stroke diagnosis  

## 2018-08-18 NOTE — Assessment & Plan Note (Signed)
I spoke with both patient and his sister who helps manage his medical care.

## 2018-08-18 NOTE — Assessment & Plan Note (Signed)
See above

## 2018-08-19 ENCOUNTER — Other Ambulatory Visit: Payer: Self-pay

## 2018-08-19 ENCOUNTER — Emergency Department (HOSPITAL_COMMUNITY): Payer: Medicare Other

## 2018-08-19 ENCOUNTER — Encounter (HOSPITAL_COMMUNITY): Payer: Self-pay | Admitting: *Deleted

## 2018-08-19 ENCOUNTER — Emergency Department (HOSPITAL_COMMUNITY)
Admission: EM | Admit: 2018-08-19 | Discharge: 2018-08-20 | Disposition: A | Discharge status: Home / Self Care | Payer: Medicare Other | Attending: Emergency Medicine | Admitting: Emergency Medicine

## 2018-08-19 DIAGNOSIS — I10 Essential (primary) hypertension: Secondary | ICD-10-CM | POA: Insufficient documentation

## 2018-08-19 DIAGNOSIS — I639 Cerebral infarction, unspecified: Secondary | ICD-10-CM | POA: Diagnosis not present

## 2018-08-19 DIAGNOSIS — I491 Atrial premature depolarization: Secondary | ICD-10-CM | POA: Diagnosis not present

## 2018-08-19 DIAGNOSIS — Z7982 Long term (current) use of aspirin: Secondary | ICD-10-CM | POA: Diagnosis not present

## 2018-08-19 DIAGNOSIS — Z79899 Other long term (current) drug therapy: Secondary | ICD-10-CM | POA: Insufficient documentation

## 2018-08-19 DIAGNOSIS — R4182 Altered mental status, unspecified: Secondary | ICD-10-CM | POA: Diagnosis present

## 2018-08-19 DIAGNOSIS — E871 Hypo-osmolality and hyponatremia: Secondary | ICD-10-CM | POA: Diagnosis not present

## 2018-08-19 DIAGNOSIS — I252 Old myocardial infarction: Secondary | ICD-10-CM | POA: Diagnosis not present

## 2018-08-19 DIAGNOSIS — Z7902 Long term (current) use of antithrombotics/antiplatelets: Secondary | ICD-10-CM | POA: Diagnosis not present

## 2018-08-19 LAB — COMPREHENSIVE METABOLIC PANEL
ALT: 21 U/L (ref 0–44)
AST: 19 U/L (ref 15–41)
Albumin: 4.4 g/dL (ref 3.5–5.0)
Alkaline Phosphatase: 68 U/L (ref 38–126)
Anion gap: 11 (ref 5–15)
BUN: 10 mg/dL (ref 8–23)
CO2: 24 mmol/L (ref 22–32)
Calcium: 9.3 mg/dL (ref 8.9–10.3)
Chloride: 96 mmol/L — ABNORMAL LOW (ref 98–111)
Creatinine, Ser: 0.81 mg/dL (ref 0.61–1.24)
GFR calc Af Amer: >60 mL/min (ref >60)
GFR calc non Af Amer: >60 mL/min (ref >60)
Glucose, Bld: 158 mg/dL — ABNORMAL HIGH (ref 70–99)
Potassium: 4.4 mmol/L (ref 3.5–5.1)
Sodium: 131 mmol/L — ABNORMAL LOW (ref 135–145)
Total Bilirubin: 0.9 mg/dL (ref 0.3–1.2)
Total Protein: 7 g/dL (ref 6.5–8.1)

## 2018-08-19 LAB — CBC
HCT: 41.9 % (ref 39.0–52.0)
Hemoglobin: 13.9 g/dL (ref 13.0–17.0)
MCH: 30.3 pg (ref 26.0–34.0)
MCHC: 33.2 g/dL (ref 30.0–36.0)
MCV: 91.5 fL (ref 80.0–100.0)
Platelets: 243 10*3/uL (ref 150–400)
RBC: 4.58 MIL/uL (ref 4.22–5.81)
RDW: 12.2 % (ref 11.5–15.5)
WBC: 7 10*3/uL (ref 4.0–10.5)
nRBC: 0 % (ref 0.0–0.2)

## 2018-08-19 MED ORDER — SODIUM CHLORIDE 0.9% FLUSH: 3.0000 mL | Freq: Once | INTRAVENOUS | Status: DC

## 2018-08-19 NOTE — ED Triage Notes (Addendum)
Pt arrived by [facility] for altered mental status. Per daughter pt was sitting at the dinner table, had an episode of unresponsiveness. Pt has hx of seizure that has presented similar in the past. ALso has hx of stroke with slow speech and altered mental status at times per EMS. Pt following commands at present. Pt slow to follow commands on Left side, although has equal strong grip strengths, no leg or arm drift; Alert to name at present. EMs also reported pt has been out of his Keppra for the past week; has been having issues with his doctor office and pharmacy for med refill

## 2018-08-20 ENCOUNTER — Encounter (HOSPITAL_COMMUNITY): Payer: Self-pay | Admitting: *Deleted

## 2018-08-20 ENCOUNTER — Telehealth: Payer: Self-pay | Admitting: Interventional Cardiology

## 2018-08-20 ENCOUNTER — Emergency Department (HOSPITAL_COMMUNITY): Payer: Medicare Other

## 2018-08-20 ENCOUNTER — Telehealth: Payer: Self-pay | Admitting: Adult Health

## 2018-08-20 ENCOUNTER — Telehealth: Payer: Self-pay | Admitting: Family Medicine

## 2018-08-20 DIAGNOSIS — I639 Cerebral infarction, unspecified: Secondary | ICD-10-CM | POA: Diagnosis not present

## 2018-08-20 LAB — URINALYSIS, ROUTINE W REFLEX MICROSCOPIC
Bilirubin Urine: NEGATIVE
Glucose, UA: NEGATIVE mg/dL
Hgb urine dipstick: NEGATIVE
Ketones, ur: NEGATIVE mg/dL
Leukocytes,Ua: NEGATIVE
Nitrite: NEGATIVE
Protein, ur: NEGATIVE mg/dL
Specific Gravity, Urine: 1.014 (ref 1.005–1.030)
pH: 5 (ref 5.0–8.0)

## 2018-08-20 LAB — RAPID URINE DRUG SCREEN, HOSP PERFORMED
Amphetamines: NOT DETECTED
Barbiturates: NOT DETECTED
Benzodiazepines: NOT DETECTED
Cocaine: NOT DETECTED
Opiates: NOT DETECTED
Tetrahydrocannabinol: NOT DETECTED

## 2018-08-20 LAB — CBG MONITORING, ED: Glucose-Capillary: 102 mg/dL — ABNORMAL HIGH (ref 70–99)

## 2018-08-20 MED ORDER — LEVETIRACETAM 750 MG PO TABS: 1500.0000 mg | ORAL_TABLET | Freq: Two times a day (BID) | ORAL | 0 refills | Status: DC

## 2018-08-20 MED ORDER — LEVETIRACETAM IN NACL 1000 MG/100ML IV SOLN
1000.0000 mg | Freq: Once | INTRAVENOUS | Status: AC
  Administered 2018-08-20: 1000 mg via INTRAVENOUS
  Filled 2018-08-20: qty 100

## 2018-08-20 NOTE — Discharge Instructions (Signed)
Please make sure you are taking your levetiracetam (Keppra) properly. You are supposed to take two tablets at at a time (1500 mg) twice a day.

## 2018-08-20 NOTE — ED Notes (Signed)
In & Out Cath not needed. Pt used urinal.

## 2018-08-20 NOTE — Telephone Encounter (Signed)
  Sister is calling because [name] had to go back to the ER last night and she would like to speak to the nurse to go over his medication listto get some clarification and get the pharmacy information changed.

## 2018-08-20 NOTE — ED Provider Notes (Signed)
[facility] EMERGENCY DEPARTMENT Provider Note   CSN: [id number] Arrival date & time: 08/19/18  1933    History   Chief Complaint Chief Complaint  Patient presents with   Altered Mental Status    HPI [name] Cruz is a 63 y.o. male.   The history is provided by the patient and the EMS personnel. The history is limited by the condition of the patient (Altered mental status).  Altered Mental Status He apparently had an episode at dinner where he was unresponsive.  He has history of similar episodes.  There is history of both strokes and seizures which have caused momentary episodes of unresponsiveness.  EMS reports that he had been out of his levetiracetam for the last week.  Patient tells me that he takes it every day and took his normal dose this morning.  He does have history of mild a fascia related to prior strokes.  Patient currently has no complaints.  Past Medical History:  Diagnosis Date   Acute deep vein thrombosis (DVT) of left upper extremity (HCC)    L brachial and basilic veins, eliquis started 08/22/2017 to continue for 3 months total    Cognitive impairment 2007   after stroke, saw rehab but told to stop because was too upsetting to him   History of chicken pox    HLD (hyperlipidemia)    HTN (hypertension)    NSTEMI (non-ST elevated myocardial infarction) (HCC) 02/21/2017   Obesity    Stroke, hemorrhagic (HCC) 2007   thought 2/2 HTN (240sbp); residual cognitive impairment, loss of R peripheral field, no driving    Patient Active Problem List   Diagnosis Date Noted   CVA (cerebral vascular accident) (HCC) 08/08/2018   Acute CVA (cerebrovascular accident) (HCC) 08/07/2018   Cerebellar stroke, acute (HCC) 07/10/2018   Status epilepticus (HCC) 03/25/2018   Unilateral occipital headache 10/03/2017   Ischemic stroke (HCC) 08/23/2017   Coronary artery disease involving coronary bypass graft of native heart without angina pectoris     Atrial fibrillation (HCC)    Dysphagia, post-stroke    Cardiac arrest (HCC) 08/09/2017   S/P CABG x 3 02/24/2017   NSTEMI (non-ST elevated myocardial infarction) (HCC) 02/21/2017   Encephalomalacia 10/28/2012   Seizure disorder (HCC) 04/26/2012   Alcohol abuse 04/26/2012   History of stroke with current residual effects    Cognitive impairment    HLD (hyperlipidemia)    HTN (hypertension)     Past Surgical History:  Procedure Laterality Date   ANKLE SURGERY  1990s   right foot with plate and screws   CORONARY ARTERY BYPASS GRAFT N/A 02/24/2017   3v Procedure: CORONARY ARTERY BYPASS GRAFTING (CABG) x 3 ON PUMP USING LEFT INTERNAL MAMMARY ARTERY TO LEFT ANTERIOR DESENDING CORNARY ARTERY, RIGHT GREATER SAPHENOUS VEIN TO LEFT CIRCUMFLEX ARTERY AND POSTERIOR DESENDING ARTERY. RIGHT GREATER SAPHENOUS VEIN OBTAINED VIA ENDOVEIN HARVEST.;  Surgeon: Delight OvensGerhardt, Edward B, MD   IABP INSERTION N/A 02/21/2017   Procedure: IABP Insertion;  Surgeon: Yvonne KendallEnd, Christopher, MD;  Location: [facility] INVASIVE CV LAB;  Service: Cardiovascular;  Laterality: N/A;   LEFT HEART CATH AND CORONARY ANGIOGRAPHY N/A 02/21/2017   Procedure: LEFT HEART CATH AND CORONARY ANGIOGRAPHY;  Surgeon: Yvonne KendallEnd, Christopher, MD;  Location: [facility] INVASIVE CV LAB;  Service: Cardiovascular;  Laterality: N/A;   TEE WITHOUT CARDIOVERSION N/A 02/24/2017   Procedure: TRANSESOPHAGEAL ECHOCARDIOGRAM (TEE);  Surgeon: Delight OvensGerhardt, Edward B, MD;  [facility];  Service: Open Heart Surgery;  Laterality: N/A;  Home Medications    Prior to Admission medications   Medication Sig Start Date End Date Taking? Authorizing Provider  aspirin EC 81 MG EC tablet Take 1 tablet (81 mg total) by mouth daily. 08/09/18 09/08/18  Arrien, Jimmy Picket, MD  atorvastatin (LIPITOR) 80 MG tablet Take 1 tablet (80 mg total) by mouth daily. 08/07/18   Ria Bush, MD  carvedilol (COREG) 3.125 MG tablet Take 1 tablet (3.125 mg total) by mouth 2 (two) times  daily. 08/07/18   Ria Bush, MD  clopidogrel (PLAVIX) 75 MG tablet Take 1 tablet (75 mg total) by mouth daily. 08/10/18 09/09/18  Ria Bush, MD  Cyanocobalamin (B-12) 1000 MCG SUBL Place 1 tablet under the tongue daily. Patient taking differently: Place 1,000 mcg under the tongue daily.  12/26/17   Ria Bush, MD  Lacosamide (VIMPAT) 100 MG TABS TAKE 100 MG IN THE MORNING AND 200 MG IN THE EVENING ( TOTAL OF 300 MG DAILY ) 08/10/18   Marcial Pacas, MD  Lacosamide (VIMPAT) 100 MG TABS Take 1 tablet in the morning and 2 at night (total 300mg  daily) 08/18/18   Ria Bush, MD  levETIRAcetam (KEPPRA) 750 MG tablet Take 2 tablets (1,500 mg total) by mouth 2 (two) times daily. 05/11/18   Venancio Poisson, NP  levETIRAcetam (KEPPRA) 750 MG tablet Take 2 tablets (1,500 mg total) by mouth 2 (two) times daily. 08/18/18   Ria Bush, MD  Multiple Vitamin (MULTIVITAMIN WITH MINERALS) TABS tablet Take 1 tablet by mouth daily. 12/26/17   Ria Bush, MD  sertraline (ZOLOFT) 25 MG tablet Take 1 tablet (25 mg total) by mouth at bedtime. 08/18/18   Ria Bush, MD    Family History Family History  Problem Relation Age of Onset   Alzheimer's disease Maternal Grandfather    Cancer Mother        lymphoma   Alcohol abuse Father        smoker   Coronary artery disease Neg Hx    Stroke Neg Hx    Diabetes Neg Hx     Social History Social History   Tobacco Use   Smoking status: Never Smoker   Smokeless tobacco: Former Systems developer  Substance Use Topics   Alcohol use: Not Currently   Drug use: No     Allergies   Losartan   Review of Systems Review of Systems  Unable to perform ROS: Mental status change     Physical Exam Updated Vital Signs BP (!) 150/87 (BP Location: Right Arm)    Pulse 61    Temp 97.6 F (36.4 C) (Oral)    Resp 16    SpO2 99%   Physical Exam Vitals signs and nursing note reviewed.    63 year old male, resting comfortably and in no  acute distress. Vital signs are significant for elevated blood pressure. Oxygen saturation is 99%, which is normal. Head is normocephalic and atraumatic. PERRLA, EOMI. Oropharynx is clear. Neck is nontender and supple without adenopathy or JVD. Back is nontender and there is no CVA tenderness. Lungs are clear without rales, wheezes, or rhonchi. Chest is nontender. Heart has regular rate and rhythm without murmur. Abdomen is soft, flat, nontender without masses or hepatosplenomegaly and peristalsis is normoactive. Extremities have no cyanosis or edema, full range of motion is present. Skin is warm and dry without rash. Neurologic: Awake and alert and oriented x3, speech is slow and mildly stuttering but content is appropriate and understanding appears normal, cranial nerves are intact, there are no  motor or sensory deficits.  ED Treatments / Results  Labs (all labs ordered are listed, but only abnormal results are displayed) Labs Reviewed  COMPREHENSIVE METABOLIC PANEL - Abnormal; Notable for the following components:      Result Value   Sodium 131 (*)    Chloride 96 (*)    Glucose, Bld 158 (*)    All other components within normal limits  CBG MONITORING, ED - Abnormal; Notable for the following components:   Glucose-Capillary 102 (*)    All other components within normal limits  CBC  RAPID URINE DRUG SCREEN, HOSP PERFORMED  URINALYSIS, ROUTINE W REFLEX MICROSCOPIC   Radiology Ct Head Wo Contrast  Result Date: 08/19/2018 CLINICAL DATA:  Altered mental status. EXAM: CT HEAD WITHOUT CONTRAST TECHNIQUE: Contiguous axial images were obtained from the base of the skull through the vertex without intravenous contrast. COMPARISON:  08/07/2018. FINDINGS: Brain: Stable moderately enlarged ventricles and subarachnoid spaces. Stable moderate patchy white matter low density in both cerebral hemispheres. Stable left posterior cerebral hemisphere encephalomalacia and ex vacuo enlargement of the  involved portions of the left lateral ventricle. No intracranial hemorrhage, mass lesion or CT evidence of acute infarction. Vascular: No hyperdense vessel or unexpected calcification. Skull: Normal. Negative for fracture or focal lesion. Sinuses/Orbits: Unremarkable. Other: None. IMPRESSION: 1. No acute abnormality. 2. Stable atrophy, chronic small vessel white matter ischemic changes and old left posterior cerebral hemisphere encephalomalacia and ex vacuo enlargement of the left lateral ventricle. Electronically Signed   By: Beckie SaltsSteven  Reid M.D.   On: 08/19/2018 21:21   Mr Brain Wo Contrast  Result Date: 08/20/2018 CLINICAL DATA:  Focal neural deficit with stroke mental status. Suspected. Altered EXAM: MRI HEAD WITHOUT CONTRAST TECHNIQUE: Multiplanar, multiecho pulse sequences of the brain and surrounding structures were obtained without intravenous contrast. COMPARISON:  Head CT from yesterday and brain MRI 08/07/2018 FINDINGS: Brain: Stable small diffusion signal in the white matter medial to the right occipital horn. No acute infarct, acute hemorrhage, hydrocephalus, or collection. Remote left parietal and posterior temporal insult with regional ventriculomegaly. Confluent FLAIR hyperintensity in the periventricular white matter, milder in the pons, compatible with chronic small vessel ischemia. Remote lacunar infarcts in the right pons and internal capsule. There have been multiple remote micro hemorrhages which could be hypertensive/post ischemic although there are a number of lobar hemorrhages and amyloid angiopathy is not excluded. Extensive remote hemorrhage along the left temporoparietal insult. Vascular: Major flow voids are preserved Skull and upper cervical spine: Negative for marrow lesion Sinuses/Orbits: Negative IMPRESSION: 1. No acute finding or change from 08/07/2018. 2. Chronic ischemic and hemorrhagic injuries. Electronically Signed   By: Marnee SpringJonathon  Watts M.D.   On: 08/20/2018 05:22     Procedures Procedures   Medications Ordered in ED Medications  sodium chloride flush (NS) 0.9 % injection 3 mL (has no administration in time range)     Initial Impression / Assessment and Plan / ED Course  I have reviewed the triage vital signs and the nursing notes.  Pertinent labs & imaging results that were available during my care of the patient were reviewed by me and considered in my medical decision making (see chart for details).  Brief period of unresponsiveness which may have been a stroke and may have been a seizure.  Old records are reviewed and he had a similar presentation 2 weeks ago where MRI did show stroke.  Initial labs show mild hyponatremia which is not clinically significant.  Will repeat MRI.  Given uncertainty  about his history and EMS getting report that he had been out of his levetiracetam, I will give him a loading dose of that.  MRI shows no evidence of new stroke.  It is noted that he is supposed to be taking 2 levetiracetam tablets at a time twice a day and patient states he is only been taking 1.  That may be the reason that he had the episode today and it may have actually been his seizure.  He is encouraged to take the levetiracetam as prescribed.  Final Clinical Impressions(s) / ED Diagnoses   Final diagnoses:  Altered mental status, unspecified altered mental status type  Hyponatremia    ED Discharge Orders    None       Dione BoozeGlick, Kyri Shader, MD 08/20/18 [phone]

## 2018-08-20 NOTE — ED Notes (Signed)
To mri 

## 2018-08-20 NOTE — Telephone Encounter (Signed)
Pt sister(on DPR) is asking for a call from RN to discuss pt being without his Keppra and being in the hospital as a result of not haviong this medication for weeks.  Sister is asking that the levETIRAcetam (KEPPRA) 750 MG tablet [id number]  Please call

## 2018-08-20 NOTE — Telephone Encounter (Addendum)
Noted. I refilled keppra this week after we found out he had not been taking.  plz call sister to ensure he has picked up new keppra 1 month supply while he awaits 3 mo from mail order.

## 2018-08-20 NOTE — ED Notes (Signed)
Old stroke in rt eye gave the visual cut

## 2018-08-20 NOTE — ED Notes (Signed)
Old stroke caused a visual cut rt eye

## 2018-08-20 NOTE — ED Notes (Signed)
Mri will be coming for this pt

## 2018-08-20 NOTE — ED Notes (Signed)
The pt is smiling and laughing  He reports that he  Wants to be checked for a stroke.  He is alert but does not knoiw the day the month or the year  He knows that trump is the president  No pain

## 2018-08-20 NOTE — Telephone Encounter (Signed)
Spoke with pt's sister, [name], relaying Dr. Synthia Innocent message.  Says she will [facility].  Says from now on, pt will have all meds [facility]  No longer using mail order.  Fyi to Dr. Darnell Level.

## 2018-08-20 NOTE — Telephone Encounter (Signed)
Left message to call back  

## 2018-08-20 NOTE — Telephone Encounter (Signed)
I called pts sister, brenda gilmore.  Some confusion of who is doing medications for pt.  I relayed that we have seen pt for seizures/stroke.  We would be the ones prescribing these medications. His pcp has prescribed [facility] [address]  Made appt for him on 08-25-18 at 1515.  She will be coming with him.

## 2018-08-20 NOTE — ED Notes (Signed)
Pt returned from mri

## 2018-08-20 NOTE — Telephone Encounter (Signed)
Spoke with Newt Minion (Sister) POA   Pt seen in ED 8/5 - sent by wife and other daughter to ED d/t altered mental status - related to seizures. pt has not been taking medication Keppra x 2 wks. Pt treated with Keppra while at [facility] and was d/c'd home this morning- [name] is working with Pharmacy on getting a refill of his Keppra. This was sent to [facility] on 08/18/2018 [facility], [name] states [facility], I have resent this medication.  Will send to Dr Darnell Level to make aware of recent ED visit.

## 2018-08-21 ENCOUNTER — Telehealth: Payer: Self-pay

## 2018-08-21 MED ORDER — SERTRALINE HCL 25 MG PO TABS: 50.0000 mg | ORAL_TABLET | Freq: Every day | ORAL | 3 refills | Status: DC
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
## 2018-08-22 ENCOUNTER — Encounter (HOSPITAL_COMMUNITY): Payer: Self-pay

## 2018-08-22 ENCOUNTER — Emergency Department (HOSPITAL_COMMUNITY)
Admission: EM | Admit: 2018-08-22 | Discharge: 2018-08-23 | Disposition: A | Discharge status: Other Acute Inpatient Hospital | Payer: Medicare Other | Attending: Emergency Medicine | Admitting: Emergency Medicine

## 2018-08-22 DIAGNOSIS — F29 Unspecified psychosis not due to a substance or known physiological condition: Secondary | ICD-10-CM

## 2018-08-22 DIAGNOSIS — Z86718 Personal history of other venous thrombosis and embolism: Secondary | ICD-10-CM | POA: Diagnosis not present

## 2018-08-22 DIAGNOSIS — Z7901 Long term (current) use of anticoagulants: Secondary | ICD-10-CM | POA: Insufficient documentation

## 2018-08-22 DIAGNOSIS — Z79899 Other long term (current) drug therapy: Secondary | ICD-10-CM | POA: Diagnosis not present

## 2018-08-22 DIAGNOSIS — Z20828 Contact with and (suspected) exposure to other viral communicable diseases: Secondary | ICD-10-CM | POA: Insufficient documentation

## 2018-08-22 DIAGNOSIS — I252 Old myocardial infarction: Secondary | ICD-10-CM | POA: Insufficient documentation

## 2018-08-22 DIAGNOSIS — Z03818 Encounter for observation for suspected exposure to other biological agents ruled out: Secondary | ICD-10-CM | POA: Diagnosis not present

## 2018-08-22 DIAGNOSIS — R4689 Other symptoms and signs involving appearance and behavior: Secondary | ICD-10-CM | POA: Diagnosis not present

## 2018-08-22 DIAGNOSIS — R4182 Altered mental status, unspecified: Secondary | ICD-10-CM | POA: Diagnosis present

## 2018-08-22 DIAGNOSIS — I10 Essential (primary) hypertension: Secondary | ICD-10-CM | POA: Insufficient documentation

## 2018-08-22 DIAGNOSIS — Z7982 Long term (current) use of aspirin: Secondary | ICD-10-CM | POA: Diagnosis not present

## 2018-08-22 DIAGNOSIS — R410 Disorientation, unspecified: Secondary | ICD-10-CM | POA: Diagnosis not present

## 2018-08-22 DIAGNOSIS — R569 Unspecified convulsions: Secondary | ICD-10-CM | POA: Diagnosis not present

## 2018-08-22 LAB — COMPREHENSIVE METABOLIC PANEL
ALT: 21 U/L (ref 0–44)
AST: 23 U/L (ref 15–41)
Albumin: 4.6 g/dL (ref 3.5–5.0)
Alkaline Phosphatase: 62 U/L (ref 38–126)
Anion gap: 14 (ref 5–15)
BUN: 8 mg/dL (ref 8–23)
CO2: 21 mmol/L — ABNORMAL LOW (ref 22–32)
Calcium: 9.4 mg/dL (ref 8.9–10.3)
Chloride: 101 mmol/L (ref 98–111)
Creatinine, Ser: 0.83 mg/dL (ref 0.61–1.24)
GFR calc Af Amer: >60 mL/min (ref >60)
GFR calc non Af Amer: >60 mL/min (ref >60)
Glucose, Bld: 107 mg/dL — ABNORMAL HIGH (ref 70–99)
Potassium: 3.5 mmol/L (ref 3.5–5.1)
Sodium: 136 mmol/L (ref 135–145)
Total Bilirubin: 0.5 mg/dL (ref 0.3–1.2)
Total Protein: 7.3 g/dL (ref 6.5–8.1)

## 2018-08-22 LAB — CBC
HCT: 40.7 % (ref 39.0–52.0)
Hemoglobin: 13.8 g/dL (ref 13.0–17.0)
MCH: 31.3 pg (ref 26.0–34.0)
MCHC: 33.9 g/dL (ref 30.0–36.0)
MCV: 92.3 fL (ref 80.0–100.0)
Platelets: 290 10*3/uL (ref 150–400)
RBC: 4.41 MIL/uL (ref 4.22–5.81)
RDW: 12.5 % (ref 11.5–15.5)
WBC: 9.8 10*3/uL (ref 4.0–10.5)
nRBC: 0 % (ref 0.0–0.2)

## 2018-08-22 LAB — RAPID URINE DRUG SCREEN, HOSP PERFORMED
Amphetamines: NOT DETECTED
Barbiturates: NOT DETECTED
Benzodiazepines: NOT DETECTED
Cocaine: NOT DETECTED
Opiates: NOT DETECTED
Tetrahydrocannabinol: NOT DETECTED

## 2018-08-22 LAB — ETHANOL: Alcohol, Ethyl (B): <10 mg/dL (ref <10)

## 2018-08-22 LAB — SARS CORONAVIRUS 2 BY RT PCR (HOSPITAL ORDER, PERFORMED IN ~~LOC~~ HOSPITAL LAB): SARS Coronavirus 2: NEGATIVE

## 2018-08-22 MED ORDER — LACOSAMIDE 50 MG PO TABS
100.0000 mg | ORAL_TABLET | Freq: Every day | ORAL | Status: DC
  Administered 2018-08-22: 100 mg via ORAL
  Filled 2018-08-22: qty 2

## 2018-08-22 MED ORDER — OLANZAPINE 5 MG PO TBDP
10.0000 mg | ORAL_TABLET | Freq: Three times a day (TID) | ORAL | Status: DC | PRN
  Administered 2018-08-22: 10 mg via ORAL
  Filled 2018-08-22 (×2): qty 2

## 2018-08-22 MED ORDER — LACOSAMIDE 50 MG PO TABS
200.0000 mg | ORAL_TABLET | Freq: Every evening | ORAL | Status: DC
  Administered 2018-08-22: 200 mg via ORAL
  Filled 2018-08-22: qty 4

## 2018-08-22 MED ORDER — STERILE WATER FOR INJECTION IJ SOLN
INTRAMUSCULAR | Status: AC
  Administered 2018-08-22: 1.2 mL
  Filled 2018-08-22: qty 10

## 2018-08-22 MED ORDER — CLOPIDOGREL BISULFATE 75 MG PO TABS
75.0000 mg | ORAL_TABLET | Freq: Every day | ORAL | Status: DC
  Administered 2018-08-22: 75 mg via ORAL
  Filled 2018-08-22: qty 1

## 2018-08-22 MED ORDER — ZIPRASIDONE MESYLATE 20 MG IM SOLR: 20.0000 mg | INTRAMUSCULAR | Status: DC | PRN

## 2018-08-22 MED ORDER — ASPIRIN EC 81 MG PO TBEC
81.0000 mg | DELAYED_RELEASE_TABLET | Freq: Every day | ORAL | Status: DC
  Administered 2018-08-22: 81 mg via ORAL
  Filled 2018-08-22: qty 1

## 2018-08-22 MED ORDER — ATORVASTATIN CALCIUM 80 MG PO TABS
80.0000 mg | ORAL_TABLET | Freq: Every day | ORAL | Status: DC
  Administered 2018-08-23: 80 mg via ORAL
  Filled 2018-08-22: qty 1

## 2018-08-22 MED ORDER — LORAZEPAM 1 MG PO TABS: 1.0000 mg | ORAL_TABLET | ORAL | Status: DC | PRN

## 2018-08-22 MED ORDER — SODIUM CHLORIDE 0.9% FLUSH: 3.0000 mL | Freq: Once | INTRAVENOUS | Status: DC

## 2018-08-22 MED ORDER — LEVETIRACETAM 500 MG PO TABS
1500.0000 mg | ORAL_TABLET | Freq: Two times a day (BID) | ORAL | Status: DC
  Administered 2018-08-22 (×2): 1500 mg via ORAL
  Filled 2018-08-22 (×2): qty 3

## 2018-08-22 MED ORDER — ZIPRASIDONE MESYLATE 20 MG IM SOLR
20.0000 mg | Freq: Once | INTRAMUSCULAR | Status: AC
  Administered 2018-08-22: 20 mg via INTRAMUSCULAR
  Filled 2018-08-22: qty 20

## 2018-08-22 MED ORDER — CARVEDILOL 3.125 MG PO TABS
3.1250 mg | ORAL_TABLET | Freq: Two times a day (BID) | ORAL | Status: DC
  Administered 2018-08-22 (×2): 3.125 mg via ORAL
  Filled 2018-08-22 (×4): qty 1

## 2018-08-22 MED ORDER — SERTRALINE HCL 50 MG PO TABS
50.0000 mg | ORAL_TABLET | Freq: Every day | ORAL | Status: DC
  Administered 2018-08-22: 50 mg via ORAL
  Filled 2018-08-22: qty 1

## 2018-08-22 NOTE — ED Notes (Signed)
[facility] called and reports that they have received the paperwork including Covid results and will take report once transport arrives.

## 2018-08-22 NOTE — Progress Notes (Signed)
Patient meets criteria for inpatient treatment. No appropriate or available beds at [facility]. CSW faxed referrals to the following facilities for review:  [facility]  [facility]  [facility]  [facility]  [facility]  [facility]  [facility]  [facility]  [facility]  [facility]   TTS will continue to seek bed placement.  Rozalynn Buege R. Stayce Delancy, MSW, LCSW Clinical Social Work/Disposition Phone: [phone] Fax: [phone]    

## 2018-08-22 NOTE — ED Notes (Signed)
Pt had episode in waiting in room where he began screaming, pt stated that he was leaving and going to walk home, pt screaming 'god is good" Pt became hard to redirect, staff and security assisted pt to room, pt continued to attempt to wander halls and states that his sister "wants to get rid of him". Pt states that he is does not want to be here and was seen here for the same the other day, therefore does not need to see a doctor, MD at bedside taking out IVC paperwork due to pt being unsafe to leave.Marland Kitchen

## 2018-08-22 NOTE — ED Notes (Signed)
This RN spoke with pt's sister and POA to update on status.  Sister reports pt has no hx of outbursts or violence.

## 2018-08-22 NOTE — Progress Notes (Signed)
Per Patriciaann Clan, PA pt is recommended for gero-psych tx. TTS to seek placement. EDP Rob, PA and pt's nurse Janett Billow, RN have been advised.   Lind Covert, MSW, LCSW Therapeutic Triage Specialist  [phone]

## 2018-08-22 NOTE — ED Provider Notes (Signed)
[facility] Provider Note   CSN: [id number] Arrival date & time: 08/22/18  0224    History   Chief Complaint Chief Complaint  Patient presents with  . Altered Mental Status    HPI Cipriano MileBilly J Colcord is a 63 y.o. male.     63 year old male with history of stroke, seizure, MI, cognitive impairment secondary to stroke presents to the ER with altered mental status.  He lives with his sister.  She brought him to the emergency department for the second time this week because of altered mental status.  Since he was in the ER 3 nights ago he has worsened.  Patient has become aggressive and threatening and she is afraid of him.  At arrival, patient is extremely labile with his mood.  He is obviously confused and disorganized, difficult to redirect.  He is disoriented.  He seems to perseverate on the fact that his sister, Sedalia MutaDiane, is trying to do something to harm him. Level V Caveat due to mental status changes.     Past Medical History:  Diagnosis Date  . Acute deep vein thrombosis (DVT) of left upper extremity (HCC)    L brachial and basilic veins, eliquis started 08/22/2017 to continue for 3 months total   . Cognitive impairment 2007   after stroke, saw rehab but told to stop because was too upsetting to him  . History of chicken pox   . HLD (hyperlipidemia)   . HTN (hypertension)   . NSTEMI (non-ST elevated myocardial infarction) (HCC) 02/21/2017  . Obesity   . Stroke, hemorrhagic (HCC) 2007   thought 2/2 HTN (240sbp); residual cognitive impairment, loss of R peripheral field, no driving    Patient Active Problem List   Diagnosis Date Noted  . CVA (cerebral vascular accident) (HCC) 08/08/2018  . Acute CVA (cerebrovascular accident) (HCC) 08/07/2018  . Cerebellar stroke, acute (HCC) 07/10/2018  . Status epilepticus (HCC) 03/25/2018  . Unilateral occipital headache 10/03/2017  . Ischemic stroke (HCC) 08/23/2017  . Coronary artery disease involving  coronary bypass graft of native heart without angina pectoris   . Atrial fibrillation (HCC)   . Dysphagia, post-stroke   . Cardiac arrest (HCC) 08/09/2017  . S/P CABG x 3 02/24/2017  . NSTEMI (non-ST elevated myocardial infarction) (HCC) 02/21/2017  . Encephalomalacia 10/28/2012  . Seizure disorder (HCC) 04/26/2012  . Alcohol abuse 04/26/2012  . History of stroke with current residual effects   . Cognitive impairment   . HLD (hyperlipidemia)   . HTN (hypertension)     Past Surgical History:  Procedure Laterality Date  . ANKLE SURGERY  1990s   right foot with plate and screws  . CORONARY ARTERY BYPASS GRAFT N/A 02/24/2017   3v Procedure: CORONARY ARTERY BYPASS GRAFTING (CABG) x 3 ON PUMP USING LEFT INTERNAL MAMMARY ARTERY TO LEFT ANTERIOR DESENDING CORNARY ARTERY, RIGHT GREATER SAPHENOUS VEIN TO LEFT CIRCUMFLEX ARTERY AND POSTERIOR DESENDING ARTERY. RIGHT GREATER SAPHENOUS VEIN OBTAINED VIA ENDOVEIN HARVEST.;  Surgeon: Delight OvensGerhardt, Edward B, MD  . IABP INSERTION N/A 02/21/2017   Procedure: IABP Insertion;  Surgeon: Yvonne KendallEnd, Travius Crochet, MD;  [facility] IN[facility];  Service: Cardiovascular;  Laterality: N/A;  . LEFT HEART CATH AND CORONARY ANGIOGRAPHY N/A 02/21/2017   Procedure: LEFT HEART CATH AND CORONARY ANGIOGRAPHY;  Surgeon: Yvonne KendallEnd, Dakayla Disanti, MD;  Location: [facility] INV[facility] CV LAB;  Service: Cardiovascular;  Laterality: N/A;  . TEE WITHOUT CARDIOVERSION N/A 02/24/2017   Procedure: TRANSESOPHAGEAL ECHOCARDIOGRAM (TEE);  Surgeon: Delight OvensGerhardt, Edward B, MD;  Location: [facility] OR;  Service: Open Heart Surgery;  Laterality: N/A;        Home Medications    Prior to Admission medications   Medication Sig Start Date End Date Taking? Authorizing Provider  aspirin EC 81 MG EC tablet Take 1 tablet (81 mg total) by mouth daily. 08/09/18 09/08/18  Arrien, York RamMauricio Daniel, MD  atorvastatin (LIPITOR) 80 MG tablet Take 1 tablet (80 mg total) by mouth daily. 08/07/18   [name], [name], MD  carvedilol (COREG)  3.125 MG tablet Take 1 tablet (3.125 mg total) by mouth 2 (two) times daily. 08/07/18   [name], [name], MD  clopidogrel (PLAVIX) 75 MG tablet Take 1 tablet (75 mg total) by mouth daily. 08/10/18 09/09/18  [name], [name], MD  Cyanocobalamin (B-12) 1000 MCG SUBL Place 1 tablet under the tongue daily. Patient taking differently: Place 1,000 mcg under the tongue daily.  12/26/17   [name], [name], MD  Lacosamide (VIMPAT) 100 MG TABS Take 1 tablet in the morning and 2 at night (total 300mg  daily) Patient taking differently: Take 100-200 mg by mouth See admin instructions. Take 1 tablet in the morning and take 2 tablets at night 08/18/18   [name], [name], MD  levETIRAcetam (KEPPRA) 750 MG tablet Take 2 tablets (1,500 mg total) by mouth 2 (two) times daily. 08/20/18   [name], [name], MD  Multiple Vitamin (MULTIVITAMIN WITH MINERALS) TABS tablet Take 1 tablet by mouth daily. 12/26/17   [name], [name], MD  sertraline (ZOLOFT) 25 MG tablet Take 2 tablets (50 mg total) by mouth at bedtime. 08/21/18   [name], [name], MD    Family History Family History  Problem Relation Age of Onset  . Alzheimer's disease Maternal Grandfather   . Cancer Mother        lymphoma  . Alcohol abuse Father        smoker  . Coronary artery disease Neg Hx   . Stroke Neg Hx   . Diabetes Neg Hx     Social History Social History   Tobacco Use  . Smoking status: Never Smoker  . Smokeless tobacco: Former Engineer, waterUser  Substance Use Topics  . Alcohol use: Not Currently  . Drug use: No     Allergies   Losartan   Review of Systems Review of Systems  Unable to perform ROS: Mental status change     Physical Exam Updated Vital Signs BP (!) 158/89 (BP Location: Right Arm)   Pulse 90   Temp 98.2 F (36.8 C) (Oral)   Resp 16   SpO2 100%   Physical Exam Vitals signs and nursing note reviewed.  Constitutional:      General: He is not in acute distress.    Appearance: Normal appearance. He is well-developed.   HENT:     Head: Normocephalic and atraumatic.     Right Ear: Hearing normal.     Left Ear: Hearing normal.     Nose: Nose normal.  Eyes:     Conjunctiva/sclera: Conjunctivae normal.     Pupils: Pupils are equal, round, and reactive to light.  Neck:     Musculoskeletal: Normal range of motion and neck supple.  Cardiovascular:     Rate and Rhythm: Regular rhythm.     Heart sounds: S1 normal and S2 normal. No murmur. No friction rub. No gallop.   Pulmonary:     Effort: Pulmonary effort is normal. No respiratory distress.     Breath sounds: Normal breath sounds.  Chest:     Chest wall: No  tenderness.  Abdominal:     General: Bowel sounds are normal.     Palpations: Abdomen is soft.     Tenderness: There is no abdominal tenderness. There is no guarding or rebound. Negative signs include Murphy's sign and McBurney's sign.     Hernia: No hernia is present.  Musculoskeletal: Normal range of motion.  Skin:    General: Skin is warm and dry.     Findings: No rash.  Neurological:     Mental Status: He is alert. He is disoriented.     GCS: GCS eye subscore is 4. GCS verbal subscore is 5. GCS motor subscore is 6.     Cranial Nerves: No cranial nerve deficit.     Sensory: No sensory deficit.     Coordination: Coordination normal.  Psychiatric:        Mood and Affect: Affect is labile.        Speech: Speech is rapid and pressured and tangential.        Behavior: Behavior is agitated, aggressive and hyperactive.        Thought Content: Thought content is paranoid.      ED Treatments / Results  Labs (all labs ordered are listed, but only abnormal results are displayed) Labs Reviewed  COMPREHENSIVE METABOLIC PANEL - Abnormal; Notable for the following components:      Result Value   CO2 21 (*)    Glucose, Bld 107 (*)    All other components within normal limits  CBC  ETHANOL  RAPID URINE DRUG SCREEN, HOSP PERFORMED    EKG None  Radiology No results found.  Procedures  Procedures (including critical care time)  Medications Ordered in ED Medications  sodium chloride flush (NS) 0.9 % injection 3 mL (has no administration in time range)  ziprasidone (GEODON) injection 20 mg (has no administration in time range)  sterile water (preservative free) injection (has no administration in time range)     Initial Impression / Assessment and Plan / ED Course  I have reviewed the triage vital signs and the nursing notes.  Pertinent labs & imaging results that were available during my care of the patient were reviewed by me and considered in my medical decision making (see chart for details).        Patient seen in ER 3 nights ago with altered mental status.  He had a thorough work-up which included MRI that did not show any abnormalities.  He had somewhat returned to his baseline during that evaluation and it was felt that he might have had a seizure.  Tonight's presentation appears to be significantly different.  He is disorganized, agitated, intermittently aggressive and seems paranoid.  As he has recently had this thorough work-up, does not require repeat imaging or other work-up for this mental status change, will require psychiatric evaluation.  Patient requiring injection of Geodon to prevent him from eloping from the emergency department.  He is a danger to himself and others.  Patient medically cleared for psychiatric evaluation.  IVC paperwork initiated.  Final Clinical Impressions(s) / ED Diagnoses   Final diagnoses:  Psychosis, unspecified psychosis type [facility])    ED Discharge Orders    None       Betsey Holiday Gwenyth Allegra, MD 08/22/18 [phone]

## 2018-08-22 NOTE — ED Notes (Signed)
Transport company called and state that they will be here for transportation at [facility]  08/23/18. Updated patient.

## 2018-08-22 NOTE — ED Triage Notes (Signed)
Pt here for AMS that has been going on for a while, recent increase in Zoloft, family reports that he is combative. Pt is pleasantly confused in triage and rambling

## 2018-08-22 NOTE — Progress Notes (Signed)
CSW was phoned by Dellis Filbert at [facility] for an update regarding patient. Dellis Filbert was informed that COVID swab has been unable to be collected from patient at current time due to agitation earlier. Dellis Filbert stated that patient would need to be swabbed/present without any COVID symptoms before he could come to the facility. He stated that he would also need a copy of the patient's IVC paperwork. Dellis Filbert stated that lab results and IVC paperwork for patient can be faxed to [phone] or [phone]. However, if this happens after 19:00 the nursing station will need to be called [phone]) and information will need to be faxed directly to them. Dellis Filbert stated that they still have a bed for patient but waiting for this information.   Chalmers Guest. Guerry Bruin, MSW, [place] Work/Disposition Phone: [phone] Fax: [phone]

## 2018-08-22 NOTE — ED Notes (Signed)
Pt snoring soundly

## 2018-08-22 NOTE — Progress Notes (Signed)
Patient has been accepted at [facility]. Accepting provider is Dr. Brantley Fling, MD. Number to call report is [phone]. CSW made attempts to contact nurse regarding disposition. IVC paperwork will need to be faxed to facility at [phone].  Chalmers Guest. Guerry Bruin, MSW, [place] Work/Disposition Phone: [phone] Fax: [phone]

## 2018-08-22 NOTE — ED Notes (Signed)
Pt awake eating  Had to be awakened he did not eat lubch had a n episode of yelling at everyone that the tray was not his and his name was not [place].  At preent pleasant  Will attempt the covid swab and his medicines that are late

## 2018-08-22 NOTE — ED Notes (Signed)
The pts sister has just called about the pt information given   Pt still sleeping

## 2018-08-22 NOTE — BH Assessment (Addendum)
Tele Assessment Note   Patient Name: [name] Cruz MRN: [id number] Referring Physician: Gilda CreasePollina, Christopher J, MD [facility] Location of Provider: Behavioral Health TTS Department  [name] Cruz is an 63 y.o. male who presents to the ED initially VOL, however pt was IVC'd by EDP after presenting with AMS. Pt has slurred speech and is difficult to comprehend at times throughout the assessment. Pt is able to accurately state his name, DOB, and basic orientation questions including the current [place] president and he pt's current location. TTS asked to pt to identify the current month in order to assess for orientation to time and pt stated "um, the 7th?" Per chart review, pt has a hx of stroke. Pt has presented multiple times in the ED over the past several weeks c/o AMS. Pt is able to report that he feels confused but he does not articulate the reason. Pt often responds to TTS with unnecessary information such as "I had a hamburger today" when asked if he has been experiencing any changes in his sleeping patterns. Pt states his sister whom he lives with is his POA, and when asked for her name pt is unable to provide her name. Pt was reportedly screaming in the waiting room of the ED and unable to redirect after prompts from ED staff. Pt is unable to care for self at this time.  Per Donell SievertSpencer Simon, PA pt is recommended for gero-psych tx. TTS to seek placement. EDP Rob, PA and pt's nurse Shanda BumpsJessica, RN have been advised.   Diagnosis: Mild neurocognitive disorder due to another medical condition  Past Medical History:  Past Medical History:  Diagnosis Date  . Acute deep vein thrombosis (DVT) of left upper extremity (HCC)    L brachial and basilic veins, eliquis started 08/22/2017 to continue for 3 months total   . Cognitive impairment 2007   after stroke, saw rehab but told to stop because was too upsetting to him  . History of chicken pox   . HLD (hyperlipidemia)   . HTN (hypertension)    . NSTEMI (non-ST elevated myocardial infarction) (HCC) 02/21/2017  . Obesity   . Stroke, hemorrhagic (HCC) 2007   thought 2/2 HTN (240sbp); residual cognitive impairment, loss of R peripheral field, no driving    Past Surgical History:  Procedure Laterality Date  . ANKLE SURGERY  1990s   right foot with plate and screws  . CORONARY ARTERY BYPASS GRAFT N/A 02/24/2017   3v Procedure: CORONARY ARTERY BYPASS GRAFTING (CABG) x 3 ON PUMP USING LEFT INTERNAL MAMMARY ARTERY TO LEFT ANTERIOR DESENDING CORNARY ARTERY, RIGHT GREATER SAPHENOUS VEIN TO LEFT CIRCUMFLEX ARTERY AND POSTERIOR DESENDING ARTERY. RIGHT GREATER SAPHENOUS VEIN OBTAINED VIA ENDOVEIN HARVEST.;  Surgeon: Delight OvensGerhardt, Edward B, MD  . IABP INSERTION N/A 02/21/2017   Procedure: IABP Insertion;  Surgeon: Yvonne KendallEnd, Christopher, MD;  [facility] INV[facility] LAB;  Service: Cardiovascular;  Laterality: N/A;  . LEFT HEART CATH AND CORONARY ANGIOGRAPHY N/A 02/21/2017   Procedure: LEFT HEART CATH AND CORONARY ANGIOGRAPHY;  Surgeon: Yvonne KendallEnd, Christopher, MD;  [facility]: [facility] INVASIVE CV LAB;  Service: Cardiovascular;  Laterality: N/A;  . TEE WITHOUT CARDIOVERSION N/A 02/24/2017   Procedure: TRANSESOPHAGEAL ECHOCARDIOGRAM (TEE);  Surgeon: Delight OvensGerhardt, Edward B, MD;  [facility];  Service: Open Heart Surgery;  Laterality: N/A;    Family History:  Family History  Problem Relation Age of Onset  . Alzheimer's disease Maternal Grandfather   . Cancer Mother        lymphoma  .  Alcohol abuse Father        smoker  . Coronary artery disease Neg Hx   . Stroke Neg Hx   . Diabetes Neg Hx     Social History:  reports that he has never smoked. He has quit using smokeless tobacco. He reports previous alcohol use. He reports that he does not use drugs.  Additional Social History:  Alcohol / Drug Use Pain Medications: See MAR Prescriptions: See MAR Over the Counter: See MAR History of alcohol / drug use?: No history of alcohol / drug abuse  CIWA: CIWA-Ar BP: (!)  158/89 Pulse Rate: 90 COWS:    Allergies:  Allergies  Allergen Reactions  . Losartan Other (See Comments)    hyperkalemia    Home Medications: (Not in a hospital admission)   OB/GYN Status:  No LMP for male patient.  General Assessment Data Location of Assessment: [facility] ED TTS Assessment: In system Is this a Tele or Face-to-Face Assessment?: Tele Assessment Is this an Initial Assessment or a Re-assessment for this encounter?: Initial Assessment Patient Accompanied by:: N/A Language Other than English: No Living Arrangements: Other (Comment) What gender do you identify as?: Male Marital status: Divorced Pregnancy Status: No Living Arrangements: Other relatives Can pt return to current living arrangement?: Yes Admission Status: Involuntary Petitioner: ED Attending Is patient capable of signing voluntary admission?: No Referral Source: Self/Family/Friend Insurance type: [facility]     Crisis Care Plan Living Arrangements: Other relatives Name of Psychiatrist: none Name of Therapist: none  Education Status Is patient currently in school?: No Is the patient employed, unemployed or receiving disability?: Receiving disability income  Risk to self with the past 6 months Suicidal Ideation: No Has patient been a risk to self within the past 6 months prior to admission? : No Suicidal Intent: No Has patient had any suicidal intent within the past 6 months prior to admission? : No Is patient at risk for suicide?: No Suicidal Plan?: No Has patient had any suicidal plan within the past 6 months prior to admission? : No Access to Means: No What has been your use of drugs/alcohol within the last 12 months?: denies Previous Attempts/Gestures: No Triggers for Past Attempts: None known Intentional Self Injurious Behavior: None Family Suicide History: No Recent stressful life event(s): Recent negative physical changes Persecutory voices/beliefs?: No Depression: No Substance abuse  history and/or treatment for substance abuse?: No Suicide prevention information given to non-admitted patients: Not applicable  Risk to Others within the past 6 months Homicidal Ideation: No Does patient have any lifetime risk of violence toward others beyond the six months prior to admission? : No Thoughts of Harm to Others: No Current Homicidal Intent: No Current Homicidal Plan: No Access to Homicidal Means: No History of harm to others?: No Assessment of Violence: None Noted Does patient have access to weapons?: Yes (Comment)(pt says he owns guns) Criminal Charges Pending?: No Does patient have a court date: No Is patient on probation?: No  Psychosis Hallucinations: None noted Delusions: Unspecified  Mental Status Report Appearance/Hygiene: Disheveled Eye Contact: Fair Motor Activity: Freedom of movement Speech: Slurred Level of Consciousness: Alert Mood: Labile Affect: Preoccupied Anxiety Level: None Thought Processes: Tangential Judgement: Impaired Orientation: Person, Place Obsessive Compulsive Thoughts/Behaviors: None  Cognitive Functioning Concentration: Fair Memory: Remote Intact, Recent Intact Is patient IDD: No Insight: Poor Impulse Control: Fair Appetite: Good Have you had any weight changes? : No Change Sleep: No Change Total Hours of Sleep: 8 Vegetative Symptoms: None  ADLScreening [facility] Assessment  Services) Patient's cognitive ability adequate to safely complete daily activities?: Yes Patient able to express need for assistance with ADLs?: Yes Independently performs ADLs?: Yes (appropriate for developmental age)  Prior Inpatient Therapy Prior Inpatient Therapy: No  Prior Outpatient Therapy Prior Outpatient Therapy: No Does patient have an ACCT team?: No Does patient have Intensive In-House Services?  : No Does patient have [facility] services? : No Does patient have P4CC services?: No  ADL Screening (condition at time of admission) Patient's  cognitive ability adequate to safely complete daily activities?: Yes Is the patient deaf or have difficulty hearing?: No Does the patient have difficulty seeing, even when wearing glasses/contacts?: No Does the patient have difficulty concentrating, remembering, or making decisions?: Yes Patient able to express need for assistance with ADLs?: Yes Does the patient have difficulty dressing or bathing?: No Independently performs ADLs?: Yes (appropriate for developmental age) Does the patient have difficulty walking or climbing stairs?: No Weakness of Legs: None Weakness of Arms/Hands: None  Home Assistive Devices/Equipment Home Assistive Devices/Equipment: None    Abuse/Neglect Assessment (Assessment to be complete while patient is alone) Abuse/Neglect Assessment Can Be Completed: Yes Physical Abuse: Denies Verbal Abuse: Denies Sexual Abuse: Denies Exploitation of patient/patient's resources: Denies Self-Neglect: Denies     Merchant navy officerAdvance Directives (For Healthcare) Does Patient Have a Medical Advance Directive?: Yes Type of Advance Directive: Healthcare Power of Attorney Copy of Healthcare Power of Attorney in Chart?: No - copy requested          Disposition: Per Donell SievertSpencer Simon, PA pt is recommended for gero-psych tx. TTS to seek placement. EDP Rob, PA and pt's nurse Shanda BumpsJessica, RN have been advised.  Disposition Initial Assessment Completed for this Encounter: Yes Disposition of Patient: Admit Type of inpatient treatment program: Adult(gero-psych tx)  This service was provided via telemedicine using a 2-way, interactive audio and video technology.  Names of all persons participating in this telemedicine service and their role in this encounter. Name:  [name] Cruz Role: Patient  Name: [name] Cruz Role: TTS          Karolee Ohsquicha R Whitt Auletta 08/22/2018 6:47 AM

## 2018-08-22 NOTE — ED Notes (Signed)
The pt is eating very well at present

## 2018-08-22 NOTE — ED Notes (Signed)
Belongings placed in locker 4 and watch and wallet placed with security

## 2018-08-22 NOTE — ED Notes (Signed)
Pt change in purple scrubs

## 2018-08-22 NOTE — ED Notes (Signed)
Breakfast ordered 

## 2018-08-22 NOTE — ED Notes (Signed)
Attempted to call transport and left a voicemail for [employer]

## 2018-08-22 NOTE — ED Notes (Signed)
I just received report  From off going rn.  Behavorial  Called  Wanted to tts this pt  The pt had just had an outburst  Aggressive  And he was given zyprexa  He has also not had his covid swab  There will be a delay    Until he is more co-operative  They just want to know whenever he has his swab resulted they have placement for him

## 2018-08-22 NOTE — ED Notes (Signed)
Pharmacist getting med here that are not in our pyxis

## 2018-08-23 NOTE — ED Notes (Signed)
Per [facility] pt has been accepted to [facility], Brantley Fling is the accepting/attending provider

## 2018-08-23 NOTE — ED Notes (Signed)
Breakfast ordered 

## 2018-08-24 NOTE — Telephone Encounter (Signed)
Newt Minion  Called back stating the increase in zoloft didn't work.  They took him [place] Friday  He was then transferred to [facility] in [place] yesterday    [place] number [phone]

## 2018-08-25 ENCOUNTER — Inpatient Hospital Stay: Payer: Self-pay | Admitting: Adult Health

## 2018-08-26 ENCOUNTER — Telehealth: Payer: Self-pay | Admitting: Adult Health

## 2018-08-26 NOTE — Telephone Encounter (Addendum)
Called to touch base with sister. Left message.

## 2018-08-26 NOTE — Telephone Encounter (Signed)
Pt's sister Newt Minion, on [place] called stating that the pt is to take Lacosamide (VIMPAT) 100 MG TABS TID but the insurance will only cover it for BID. She would like provider or RN to call her back to discuss this. Please advise.

## 2018-08-27 NOTE — Telephone Encounter (Addendum)
Spoke with sister.  Dion was admitted to [facility] in [place] - continues staying there. Ongoing psychosis currently undergoing treatment.  I asked her to let us know when he comes home.

## 2018-08-27 NOTE — Telephone Encounter (Signed)
I spoke to sister and relayed that will need to call insurance and see if will give approval for TID dosing vs BID.  Once I find will let her know. Pt continues with light spells)starring).

## 2018-08-31 NOTE — Telephone Encounter (Signed)
Lacosamide order should be 100 mg a.m. and 200 mg p.m. not 3 times daily dosing

## 2018-08-31 NOTE — Telephone Encounter (Signed)
Could this be made to vimpat 150mg  po BID (vs ) 100mg  po am, 200mg  po pm.

## 2018-09-01 ENCOUNTER — Ambulatory Visit: Payer: Medicare Other | Admitting: Family Medicine

## 2018-09-02 NOTE — Telephone Encounter (Signed)
Attempted PA for vimpat relates not a pt with #.  Will call [phone].

## 2018-09-02 NOTE — Telephone Encounter (Signed)
Spoke to pts sister.  They did have vimpat when pt got home from most recent hospitalization. He is taking 100mg  po am and 200mg  po pm.  They would like to use local pharmacy for this vs mail order.  09-22-18 appt scheduled will address then.

## 2018-09-02 NOTE — Telephone Encounter (Signed)
I called and spoke to [name] Cruz in [place] (at [facility]) when I attempted to do PA on vimpat 100mg  po am and 200mg  po pm.  She stated that approval was already given for this drug and dose PA [id number]. Last dispensed 08-17-18 for 90 day supply. #270 (next delivery via mail order is 10/20/2018.  [phone].    [id number] BIN [id number], PCN [id number], GRP [id number].

## 2018-09-03 NOTE — Telephone Encounter (Signed)
Rick @ [employer] is asking for a call from RN to discuss the PA on pt's Vimpat when calling back please use Refrence JOI:[id number]

## 2018-09-03 NOTE — Telephone Encounter (Signed)
I called and spoke to [facility] and PA already done and approved previously.

## 2018-09-04 ENCOUNTER — Encounter: Payer: Self-pay | Admitting: Family Medicine

## 2018-09-04 ENCOUNTER — Ambulatory Visit (INDEPENDENT_AMBULATORY_CARE_PROVIDER_SITE_OTHER): Payer: Medicare Other | Admitting: Family Medicine

## 2018-09-04 VITALS — BP 113/65 | HR 68 | Ht 63.0 in

## 2018-09-04 DIAGNOSIS — I10 Essential (primary) hypertension: Secondary | ICD-10-CM | POA: Diagnosis not present

## 2018-09-04 DIAGNOSIS — G40909 Epilepsy, unspecified, not intractable, without status epilepticus: Secondary | ICD-10-CM

## 2018-09-04 DIAGNOSIS — R4189 Other symptoms and signs involving cognitive functions and awareness: Secondary | ICD-10-CM | POA: Diagnosis not present

## 2018-09-04 DIAGNOSIS — R451 Restlessness and agitation: Secondary | ICD-10-CM

## 2018-09-04 DIAGNOSIS — I6930 Unspecified sequelae of cerebral infarction: Secondary | ICD-10-CM

## 2018-09-04 NOTE — Assessment & Plan Note (Signed)
Recent episode of acute agitation of unclear cause. Has been started on geodon and doing well at home. Will need sugars monitored. Continue current regimen.

## 2018-09-04 NOTE — Assessment & Plan Note (Signed)
Reviewed vimpat and keppra - they state they have enough medications at home.

## 2018-09-04 NOTE — Assessment & Plan Note (Signed)
I spoke with both patient and his sister today.

## 2018-09-04 NOTE — Assessment & Plan Note (Signed)
BP overall stable on low dose coreg.

## 2018-09-04 NOTE — Progress Notes (Signed)
   [name] Cruz - 63 y.o. male  MRN [id number]  Date of Birth: [birth year]  PCP: Ria Bush, MD  This service was provided via telemedicine. Phone Visit performed on 09/04/2018    Rationale for phone visit along with limitations reviewed. Patient consented to telephone encounter.    Location of patient: home Location of provider: in office, [place] [facility] Name of referring provider: N/A   Names of persons and role in encounter: Provider: Ria Bush, MD  Patient: [name] Cruz  Other: sister Diane   Time on call: 10:24am - 10:36am   Subjective: Chief Complaint  Patient presents with  . Hospitalization Follow-up    Seen at [facility]      HPI:  Recent behavioral med hospitalization, records not available. I did review ER records from 08/22/2018 seen for AMS, aggression, threatening behavior towards sister (this was new and unlike him). IVC to behavioral health hospital [facility] in [place]). Has been home for the past week, has been feeling well and staying active mowing lawn with his ride mower. Has been started on geodon 40mg  at bedtime, seems to be tolerating well. Continues sertraline 25mg  tablet daily.   He does have vimpat and keppra at home to take for seizure disorder and reports compliance with all medications.   Pt states he was constipated and felt bad due to this. Now bowels are back to normal. Denies headache, chest pain, dyspnea. BP low today - denies dizziness or lightheadedness. Home readings overall normal. Improved on retesting today.    Objective/Observations:  No physical exam or vital signs collected unless specifically identified below.   BP 113/65 (BP Location: Right Arm)   Pulse 68   Ht 5\' 3"  (1.6 m)   BMI 31.01 kg/m   Respiratory status: speaks in complete sentences without evident shortness of breath.   Assessment/Plan:  Agitation Recent episode of acute agitation of unclear cause. Has been  started on geodon and doing well at home. Will need sugars monitored. Continue current regimen.   Cognitive impairment I spoke with both patient and his sister today.   HTN (hypertension) BP overall stable on low dose coreg.   Seizure disorder ([place]) Reviewed vimpat and keppra - they state they have enough medications at home.    I discussed the assessment and treatment plan with the patient. The patient was provided an opportunity to ask questions and all were answered. The patient agreed with the plan and demonstrated an understanding of the instructions.  Lab Orders  No laboratory test(s) ordered today    No orders of the defined types were placed in this encounter.   The patient was advised to call back or seek an in-person evaluation if the symptoms worsen or if the condition fails to improve as anticipated.  Ria Bush, MD

## 2018-09-11 ENCOUNTER — Other Ambulatory Visit: Payer: Self-pay | Admitting: Family Medicine

## 2018-09-11 NOTE — Telephone Encounter (Signed)
Will forward refill request to neurology.

## 2018-09-22 ENCOUNTER — Inpatient Hospital Stay: Payer: Medicare Other | Admitting: Adult Health

## 2018-09-22 ENCOUNTER — Telehealth: Payer: Self-pay | Admitting: *Deleted

## 2018-09-22 ENCOUNTER — Telehealth: Payer: Self-pay

## 2018-09-22 NOTE — Telephone Encounter (Signed)
Patient was a no call/no show for their appointment today.   

## 2018-09-22 NOTE — Telephone Encounter (Signed)
[name] Nearing NP with [employer] left a voicemail stating that she was doing a wellness visit with patient today for peripheral artery disease and did a quanti flow non-invasive test and noted bilateral  PAD both legs, pulse was strong, skin warm, so no emergent concern.  [name] Cruz wanted to bring this to Dr. Danise Mina attention and if he has any questions he can call her back.

## 2018-09-22 NOTE — Telephone Encounter (Signed)
Will await formal note.

## 2018-09-22 NOTE — Progress Notes (Deleted)
[facility] [address] [place]. [place] [id number] [phone]       OFFICE FOLLOW UP NOTE  [name] Cruz Date of Birth:  04-16-1955 Medical Record Number:  [id number]   Referring MD:   Tomasa HostellerJavier Guitierez  Reason for Referral:  seizures  No chief complaint on file.    HPI: [name] Cruz is [age] year Caucasian male seen today for initial office consultation visit. Is a complaint by his mother. History is obtained from them and review of electronic medical records. I have personally viewed imaging films in PACS.[name] Cruz is an 63 y.o. male  With PMH significant for Seizures, hemorrhagic CVA 2004 ( residual cognitive deficits only), alcohol abuse who [facility] with seizures.on 08/09/17 .Per family patient was in food [employer] where he didn't feel quite right spun around twice, fell to the floor and began to have a seizure. Transported [facility]. [facility] patient had a second seizure witnessed by staff  That lasted approximately 30 seconds where his eyes rolled to the back of his head and he began having a tonic/clonic seizure. 1 mg ativan given at this time and patient was taken to CT. In route to CT patient began having runs of V-tach. While in CT patient was post-ictal with roving eye movements, and then went into pulseless v-tach. At this time chest compressions were started. Once patient was back in room in the process of being intubated he began to have another tonic/clonic seizure. Patient was intubated without any medications. 1g of keppra was given, propofol started at 682mcg/min/kg ( unable to titrate up due to hypotension). Family states that patient does not have a history of seizures. Chart review revealed that [facility] Thad Rangereynolds in 2014 for seizures. Patient was discharged on Dilantin at that time with follow-up  appointment with  Outpatient neurology [facility]. Patient last filled phenytoin 100 mg TID prescription in  march 2019 and it would have run out in June. Questioning compliance.upon evaluation in the ED basic lab work and urinalysis were unremarkable..CT head: no intracranial abnormality, stable chronic ventriculomegaly with left temporal occipital encephalomalacia and central volume loss It was felt that the patient likely had seizures in setting of noncompliance off Dilantin and discontinuation. His old hemorrhagic stroke was felt to be the focus for his symptomatic seizures. He had at least 2 witnessed seizures in the ER possibly a third one. His cardiac monitor showed possibly ventricular tachycardia resulting in cardiac arrest. He was loaded with 1 g of Keppra with maintenance dose of 1 g twice daily.  Vimpat was subsequently added as well. EEG obtained on 08/09/17 showed frequent left frontotemporal seizures bridged by periodic lateralized discharges on the in interictal ictal continuum consistent with partial status epilepticus from the left frontotemporal region.Vimpat 200 mg every 12 hourly was added and when necessary lorazepam was also used. Patient underwent long-term EEG monitoring overnight with simultaneous video monitoring which recorded several electrographic seizures arising from the right temporal region but no obvious clinical accompaniment to these electrographic seizures.follow-up prolonged video EEG monitoring showed no further evidence of clinical or subclinical seizures.patient was intubated and Sedated with propofol and Versed. However repeat video EEG recording on 08/12/17 showed persistent clinical or subclinical seizures present throughout the recording. Dilantin was added and patient was continued on Keppra and Vimpat. Further repeat EEG showed no further seizures. His sedation was withdrawn.patient was eventually extubated. He was discharged on  Keppra and Dilantin and Vimpat was discontinued.   Initial visit 09/25/2017  Dr. Pearlean Brownie: the patient states his done well since discharge has not been  taking Dilantin and said his been taking Keppra 1 g twice daily only. He is living at home with his family. His abnormal further witnessed seizures. His back to his baseline. He still complains of some minor pain in the back of his head from the fall related to the seizure and injury. 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 He has residual right-sided homonymous hemianopsia which is persistent. He does not drive. States his blood pressure is well controlled and today it is 114/72.  Interval history 01/15/2018: Patient is being seen today for 2-month follow-up visit and is accompanied by his mother.  Patient did have ED visit on 10/01/2017 with main complaint of right-sided headache without any new neuro deficits or changes in behavior.  Initial BMP showed potassium 6.1 but on recheck 5.5.  CT head negative.  EKG unchanged.  Patient was discharged home in stable condition with recommendations of PCP follow-up for potassium recheck.  He was followed by his cardiologist with appointment on 11/2017 with recommendations of discontinuing Eliquis as he is completed 78-month therapy for DVT and initiated aspirin 81 mg daily for secondary stroke prevention.  He continues this without side effects of bleeding or bruising.  He continues on atorvastatin for HLD management without side effects myalgias.  Blood pressure today 113/75.  He continues on Keppra 1000 mg twice daily without any recent seizure activity.  His mother is concerned as he has excessive daytime fatigue with frequent napping despite sleeping well at night.  She states this is been present since hospital discharge.  She is concerned that this possibly could be medication related.  Patient does stay active during the day with ambulating frequently.  He does currently live with his sister but is able to manage all ADLs independently.  He denies any recent falls.  He continues to have residual  right homonymous hemianopsia and denies any worsening.  He does endorse superficial chest pain at incision site which has been present without any worsening since hospitalization.  He did have recent follow-up with cardiology without any further recommendations.  No further concerns at this time.  Denies new or worsening stroke/TIA symptoms.  Update 09/22/2018: [name] Cruz is being seen today for hospital follow-up.  He presented to [facility] ED on 07/09/2018 with transient confusion and slurred speech while talking on the phone.  Neurology consulted with stroke work-up revealing incidental right cerebellar infarct has evidence of MRI secondary to small vessel disease worse but not felt to be source of confusion.  In addition to acute stroke, MRI also showed old left MCA infarcts and multiple scattered microhemorrhages along with small vessel disease and atrophy.  CTA head/neck showed right ICA 60% next density in the left ICA less than 50% stenosis.  2D echo unremarkable.  It was felt as though his symptoms are related to unwitnessed seizure activity with postictal confusion as EEG showed electrographic transient seizures.  Recommended increasing Vimpat dose to 100/200 mg twice daily and continuation of Keppra 1500 mg daily and side effects with higher dosage.  Discharged on aspirin 325 mg daily and continuation of atorvastatin for secondary stroke prevention.  Did not recommend initiating Plavix due to evidence of multiple microhemorrhages. He returned on 08/07/2018 with sudden onset aphasia, dysarthria and facial droop which resolved prior to admission.  Neurology consulted with stroke work-up revealing punctate acute/subacute white matter infarcts involving the right forcep major, right PCA territory as evidenced on MRI, likely secondary to small vessel disease.  Initiated DAPT for 3 weeks and Plavix alone.  Recommended follow-up with Dr. Leonie Man in regards to potentially initiating [facility] for history of PAF which was not  previously started due to history of left MCA infarct with hemorrhagic conversion.  As current stroke etiology small vessel disease without evidence of atrial fibrillation on telemetry, decision to continue with DAPT at that time.  Recommended continuation of current AEDs with symptoms concerning for seizure activity but not conclusive.  Discharged home in stable condition. He returned on 08/19/2018 with brief episode of unresponsiveness with EMS reporting patient reported being out of [place] for the past week.  Patient endorsed compliance but was only taking 1 tablet daily when he was supposed to be taking 2 tablets twice daily.  MRI unremarkable for acute stroke. He returned on 08/22/2018 with altered mental status with aggression and paranoia.  Per ED note reviewed, presentation significantly different from prior presentations with seizures or strokes.  He required psychiatric evaluation and was transferred to [facility] for further psychiatric evaluation and treatment.    ROS:   14 system review of systems is positive for chest pain, daytime sleepiness, memory loss, speech difficulty, agitation and confusion and all systems negative  PMH:  Past Medical History:  Diagnosis Date   Acute deep vein thrombosis (DVT) of left upper extremity (HCC)    L brachial and basilic veins, eliquis started 08/22/2017 to continue for 3 months total    Cognitive impairment 2007   after stroke, saw rehab but told to stop because was too upsetting to him   History of chicken pox    HLD (hyperlipidemia)    HTN (hypertension)    NSTEMI (non-ST elevated myocardial infarction) ([place]) 02/21/2017   Obesity    Stroke, hemorrhagic ([place]) 2007   thought 2/2 HTN (240sbp); residual cognitive impairment, loss of R peripheral field, no driving    Social History:  Social History   Socioeconomic History   Marital status: Single    Spouse name: Not on file   Number of children: 2   Years of  education: Not on file   Highest education level: 10th grade  Occupational History    Comment: Unemployed   Scientist, product/process development strain: Not on file   Food insecurity    Worry: Not on file    Inability: Not on file   Transportation needs    Medical: Not on file    Non-medical: Not on file  Tobacco Use   Smoking status: Never Smoker   Smokeless tobacco: Former Network engineer and Sexual Activity   Alcohol use: Not Currently   Drug use: No   Sexual activity: Not Currently  Lifestyle   Physical activity    Days per week: Not on file    Minutes per session: Not on file   Stress: Not on file  Relationships   Social connections    Talks on phone: Not on file    Gets together: Not on file    Attends religious service: Not on file    Active member of club or organization: Not on file    Attends meetings of clubs or organizations: Not on file    Relationship status: Not on file   Intimate partner violence    Fear of current or ex partner: Not  on file    Emotionally abused: Not on file    Physically abused: Not on file    Forced sexual activity: Not on file  Other Topics Concern   Not on file  Social History Narrative   Caffeine: 1 pot coffee   Lives with oldest sister in [place]   Occupation: disabled, was Corporate investment banker   Does not drive.   Activity: no regular exercise   Diet: good water, drinks V8 juice   Right handed     Medications:   Current Outpatient Medications on File Prior to Visit  Medication Sig Dispense Refill   atorvastatin (LIPITOR) 80 MG tablet Take 1 tablet (80 mg total) by mouth daily. 90 tablet 3   carvedilol (COREG) 3.125 MG tablet Take 1 tablet (3.125 mg total) by mouth 2 (two) times daily. 180 tablet 3   Cyanocobalamin (B-12) 1000 MCG SUBL Place 1 tablet under the tongue daily. (Patient taking differently: Place 1,000 mcg under the tongue daily. ) 90 each 0   Lacosamide (VIMPAT) 100 MG TABS Take 1 tablet in the  morning and 2 at night (total 300mg  daily) (Patient taking differently: Take 100-200 mg by mouth See admin instructions. Take 1 tablet in the morning and take 2 tablets at night) 90 tablet 0   levETIRAcetam (KEPPRA) 750 MG tablet TAKE 2 TABLETS (1,500 MG TOTAL) BY MOUTH 2 (TWO) TIMES DAILY. 120 tablet 0   Multiple Vitamin (MULTIVITAMIN WITH MINERALS) TABS tablet Take 1 tablet by mouth daily. 90 tablet 0   sertraline (ZOLOFT) 25 MG tablet Take 1 tablet (25 mg total) by mouth at bedtime.     ziprasidone (GEODON) 40 MG capsule Take 1 capsule by mouth at bedtime.     No current facility-administered medications on file prior to visit.     Allergies:   Allergies  Allergen Reactions   Losartan Other (See Comments)    hyperkalemia    Physical Exam General: well developed, well nourished middle-age pleasant Caucasian male, seated, in no evident distress Head: head normocephalic and atraumatic.   Neck: supple with no carotid or supraclavicular bruits Cardiovascular: regular rate and rhythm, no murmurs Musculoskeletal: no deformity Skin:  no rash/petichiae prominent midline chest surgical scar from cardiac surgery Vascular:  Normal pulses all extremities  Neurologic Exam Mental Status: Awake and fully alert. Oriented to place and time. Recent and remote memoryi dminished.  Attention span, concentration and fund of knowledge appropriate. Mood and affect appropriate.  Cranial Nerves: Pupils equal, briskly reactive to light. Extraocular movements full without nystagmus. Visual fields show dense right homonymous hemianopsia to confrontation. Hearing intact. Facial sensation intact. Face, tongue, palate moves normally and symmetrically.  Motor: Normal bulk and tone. Normal strength in all tested extremity muscles. Sensory.: intact to touch , pinprick , position and vibratory sensation.  Coordination: Rapid alternating movements normal in all extremities. Finger-to-nose and heel-to-shin performed  accurately bilaterally. Gait and Station: Arises from chair without difficulty. Stance is normal. Gait demonstrates normal stride length and balance without use of assistive device. Unable to heel, toe and tandem walk without difficulty.  Reflexes: 1+ and symmetric. Toes downgoing.       ASSESSMENT:  63 year old Caucasian male with recent hospitalization for complex partial status epilepticus with history of symptomatic epilepsy following remote hemorrhagic stroke and ethanol abuse.  Patient is being seen today for 52-month follow-up visit and overall has been doing well without any recurrent seizure activity.      PLAN: -Continue aspirin 81 mg and atorvastatin for  secondary stroke prevention -Continue to follow-up with PCP in regards to HLD and HTN management -Due to daytime fatigue, it is recommended to change Keppra to extended release therefore Keppra XR 1000mg  twice daily sent to pharmacy -Advised patient that if he continues to experience daytime fatigue, we can consider referring for sleep consult for possible underlying sleep disorder -Continue to stay active and maintain a healthy diet -Continue to monitor blood pressure at home -Maintain strict control of hypertension with blood pressure goal below 130/90, diabetes with hemoglobin A1c goal below 6.5% and cholesterol with LDL cholesterol (bad cholesterol) goal below 70 mg/dL. I also advised the patient to eat a healthy diet with plenty of whole grains, cereals, fruits and vegetables, exercise regularly and maintain ideal body weight.  Followup in the future with me in 6 months or call earlier if needed   Greater than 50% time during this 25 minute consultation visit was spent on counseling and coordination of care about his seizures, remote intracerebral hemorrhage and answering questions.   George HughJessica Merwyn Hodapp, AGNP-BC  [facility] [address] [place], [place] [address]  Phone [phone]  Fax [phone] Note: This document was prepared with digital dictation and possible smart phrase technology. Any transcriptional errors that result from this process are unintentional.

## 2018-09-23 ENCOUNTER — Encounter: Payer: Self-pay | Admitting: Adult Health

## 2018-09-25 ENCOUNTER — Other Ambulatory Visit: Payer: Self-pay | Admitting: Family Medicine

## 2018-09-25 NOTE — Telephone Encounter (Signed)
Patient's sister,Brenda,called.  Patient needs a refill on Ziprasidone 40 mg.  It was prescribed recently at the Hospital. Patient uses [facility].  [name] said all of his prescriptions should be refilled at [facility].  [name] is going to fax patient's list of medications.

## 2018-09-29 MED ORDER — ZIPRASIDONE HCL 40 MG PO CAPS: 40.0000 mg | ORAL_CAPSULE | Freq: Every day | ORAL | 1 refills | Status: DC

## 2018-09-29 NOTE — Telephone Encounter (Signed)
Last office visit 09/04/2018 for hospital follow up.  Medication was prescribed in hospital. Ok to refill?

## 2018-09-29 NOTE — Telephone Encounter (Signed)
I sent this in

## 2018-10-17 ENCOUNTER — Other Ambulatory Visit: Payer: Self-pay | Admitting: Adult Health

## 2018-10-19 NOTE — Telephone Encounter (Signed)
I called and spoke to [place], sister on DPR ( made appt for this Thursday at 10-22-18 at 1315 (arrive 67min prior).  She stated just filled pill box for him and till Saturday.  Will refill at this time.

## 2018-10-22 ENCOUNTER — Telehealth: Payer: Self-pay | Admitting: Adult Health

## 2018-10-22 ENCOUNTER — Encounter: Payer: Self-pay | Admitting: Adult Health

## 2018-10-22 ENCOUNTER — Ambulatory Visit: Payer: Medicare Other | Admitting: Adult Health

## 2018-10-22 ENCOUNTER — Other Ambulatory Visit: Payer: Self-pay

## 2018-10-22 VITALS — BP 140/84 | HR 53 | Temp 99.8°F | Ht 63.0 in | Wt 167.6 lb

## 2018-10-22 DIAGNOSIS — I6930 Unspecified sequelae of cerebral infarction: Secondary | ICD-10-CM

## 2018-10-22 DIAGNOSIS — F0150 Vascular dementia without behavioral disturbance: Secondary | ICD-10-CM | POA: Diagnosis not present

## 2018-10-22 DIAGNOSIS — R413 Other amnesia: Secondary | ICD-10-CM | POA: Diagnosis not present

## 2018-10-22 DIAGNOSIS — E782 Mixed hyperlipidemia: Secondary | ICD-10-CM

## 2018-10-22 DIAGNOSIS — G40909 Epilepsy, unspecified, not intractable, without status epilepticus: Secondary | ICD-10-CM | POA: Diagnosis not present

## 2018-10-22 DIAGNOSIS — I10 Essential (primary) hypertension: Secondary | ICD-10-CM

## 2018-10-22 MED ORDER — VIMPAT 100 MG PO TABS: ORAL_TABLET | ORAL | 3 refills | Status: DC

## 2018-10-22 MED ORDER — LEVETIRACETAM 750 MG PO TABS: 1500.0000 mg | ORAL_TABLET | Freq: Two times a day (BID) | ORAL | 6 refills | Status: DC

## 2018-10-22 NOTE — Patient Instructions (Signed)
Due to continued small bleeds and strokes seen on MRI with ongoing cognitive/memory impairment, order placed for MRI brain with and without contrast to assess for potential underlying cause  We will repeat EEG as prior EEG abnormal  Continue Vimpat 100 mg a.m. and 200 mg p.m. along with Keppra 1500 mg twice daily -refills placed  Referral placed for neurocognitive evaluation for further evaluation of cognitive/memory impairment  Continue aspirin 81 mg daily  and Lipitor for secondary stroke prevention  Continue to follow up with PCP regarding cholesterol and blood pressure management   Continue to monitor blood pressure at home  Maintain strict control of hypertension with blood pressure goal below 130/90, diabetes with hemoglobin A1c goal below 6.5% and cholesterol with LDL cholesterol (bad cholesterol) goal below 70 mg/dL. I also advised the patient to eat a healthy diet with plenty of whole grains, cereals, fruits and vegetables, exercise regularly and maintain ideal body weight.  Followup in the future with me in 4 months or call earlier if needed       Thank you for coming to see [place] at [facility]. I hope we have been able to provide you high quality care today.  You may receive a patient satisfaction survey over the next few weeks. We would appreciate your feedback and comments so that we may continue to improve ourselves and the health of our patients.

## 2018-10-22 NOTE — Telephone Encounter (Signed)
[employer] medicare order sent to [facility]. No auth they will reach out to the patient to schedule.  

## 2018-10-22 NOTE — Progress Notes (Signed)
[facility] [address] [place]. [place] [address] [phone]       OFFICE FOLLOW UP NOTE  [name]. [name] Cruz Date of Birth:  [birth year] Medical Record Number:  [id number]   Referring MD:   Tomasa HostellerJavier Guitierez  Reason for Referral:  seizures  No chief complaint on file.    HPI: [name] Cruz is [age] year Caucasian male seen today for initial office consultation visit. Is a complaint by his mother. History is obtained from them and review of electronic medical records. I have personally viewed imaging films in PACS.[name] Cruz is an 63 y.o. male  With PMH significant for Seizures, hemorrhagic CVA 2004 ( residual cognitive deficits only), alcohol abuse who [facility] with seizures.on 08/09/17 .Per family patient was in food [employer] where he didn't feel quite right spun around twice, fell to the floor and began to have a seizure. [facility]. [facility] patient had a second seizure witnessed by staff  That lasted approximately 30 seconds where his eyes rolled to the back of his head and he began having a tonic/clonic seizure. 1 mg ativan given at this time and patient was taken to CT. In route to CT patient began having runs of V-tach. While in CT patient was post-ictal with roving eye movements, and then went into pulseless v-tach. At this time chest compressions were started. Once patient was back in room in the process of being intubated he began to have another tonic/clonic seizure. Patient was intubated without any medications. 1g of keppra was given, propofol started at 672mcg/min/kg ( unable to titrate up due to hypotension). Family states that patient does not have a history of seizures. Chart review revealed that [facility] Thad Rangereynolds in 2014 for seizures. Patient was discharged on Dilantin at that time with follow-up  appointment with  Outpatient neurology [facility]. Patient last filled phenytoin 100 mg TID prescription in  march 2019 and it would have run out in June. Questioning compliance.upon evaluation in the ED basic lab work and urinalysis were unremarkable..CT head: no intracranial abnormality, stable chronic ventriculomegaly with left temporal occipital encephalomalacia and central volume loss It was felt that the patient likely had seizures in setting of noncompliance off Dilantin and discontinuation. His old hemorrhagic stroke was felt to be the focus for his symptomatic seizures. He had at least 2 witnessed seizures in the ER possibly a third one. His cardiac monitor showed possibly ventricular tachycardia resulting in cardiac arrest. He was loaded with 1 g of Keppra with maintenance dose of 1 g twice daily.  Vimpat was subsequently added as well. EEG obtained on 08/09/17 showed frequent left frontotemporal seizures bridged by periodic lateralized discharges on the in interictal ictal continuum consistent with partial status epilepticus from the left frontotemporal region.Vimpat 200 mg every 12 hourly was added and when necessary lorazepam was also used. Patient underwent long-term EEG monitoring overnight with simultaneous video monitoring which recorded several electrographic seizures arising from the right temporal region but no obvious clinical accompaniment to these electrographic seizures.follow-up prolonged video EEG monitoring showed no further evidence of clinical or subclinical seizures.patient was intubated and Sedated with propofol and Versed. However repeat video EEG recording on 08/12/17 showed persistent clinical or subclinical seizures present throughout the recording. Dilantin was added and patient was continued on Keppra and Vimpat. Further repeat EEG showed no further seizures. His sedation was withdrawn.patient was eventually extubated. He was discharged on  Keppra and Dilantin and Vimpat was discontinued.   Initial visit 09/25/2017  Dr. Pearlean Brownie: the patient states his done well since discharge has not been  taking Dilantin and said his been taking Keppra 1 g twice daily only. He is living at home with his family. His abnormal further witnessed seizures. His back to his baseline. He still complains of some minor pain in the back of his head from the fall related to the seizure and injury. 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 He has residual right-sided homonymous hemianopsia which is persistent. He does not drive. States his blood pressure is well controlled and today it is 114/72.  Interval history 01/15/2018: Patient is being seen today for 62-month follow-up visit and is accompanied by his mother.  Patient did have ED visit on 10/01/2017 with main complaint of right-sided headache without any new neuro deficits or changes in behavior.  Initial BMP showed potassium 6.1 but on recheck 5.5.  CT head negative.  EKG unchanged.  Patient was discharged home in stable condition with recommendations of PCP follow-up for potassium recheck.  He was followed by his cardiologist with appointment on 11/2017 with recommendations of discontinuing Eliquis as he is completed 19-month therapy for DVT and initiated aspirin 81 mg daily for secondary stroke prevention.  He continues this without side effects of bleeding or bruising.  He continues on atorvastatin for HLD management without side effects myalgias.  Blood pressure today 113/75.  He continues on Keppra 1000 mg twice daily without any recent seizure activity.  His mother is concerned as he has excessive daytime fatigue with frequent napping despite sleeping well at night.  She states this is been present since hospital discharge.  She is concerned that this possibly could be medication related.  Patient does stay active during the day with ambulating frequently.  He does currently live with his sister but is able to manage all ADLs independently.  He denies any recent falls.  He continues to have residual  right homonymous hemianopsia and denies any worsening.  He does endorse superficial chest pain at incision site which has been present without any worsening since hospitalization.  He did have recent follow-up with cardiology without any further recommendations.  No further concerns at this time.  Denies new or worsening stroke/TIA symptoms.  Update 10/22/2018: [name] Cruz is being seen today for hospital follow-up.  He presented to [facility] ED on 07/09/2018 with transient confusion and slurred speech while talking on the phone.  Neurology consulted with stroke work-up revealing incidental right cerebellar infarct has evidence of MRI secondary to small vessel disease worse but not felt to be source of confusion.  In addition to acute stroke, MRI also showed old left MCA infarcts and multiple scattered microhemorrhages along with small vessel disease and atrophy.  CTA head/neck showed right ICA 60% next density in the left ICA less than 50% stenosis.  2D echo unremarkable.  It was felt as though his symptoms are related to unwitnessed seizure activity with postictal confusion as EEG showed electrographic transient seizures.  Recommended increasing Vimpat dose to 100/200 mg twice daily and continuation of Keppra 1500 mg daily and side effects with higher dosage.  Discharged on aspirin 325 mg daily and continuation of atorvastatin for secondary stroke prevention.  Did not recommend initiating Plavix due to evidence of multiple microhemorrhages. He returned on 08/07/2018 with sudden onset aphasia, dysarthria and facial droop which resolved prior to admission.  Neurology consulted with stroke work-up revealing punctate acute/subacute white matter infarcts involving the right forcep major, right PCA territory as evidenced on MRI, likely secondary to small vessel disease.  Initiated DAPT for 3 weeks and Plavix alone.  Recommended follow-up with Dr. Pearlean Brownie in regards to potentially initiating [facility] for history of PAF which was not  previously started due to history of left MCA infarct with hemorrhagic conversion.  As current stroke etiology small vessel disease without evidence of atrial fibrillation on telemetry, decision to continue with DAPT at that time.  Recommended continuation of current AEDs with symptoms concerning for seizure activity but not conclusive.  Discharged home in stable condition. He returned on 08/19/2018 with brief episode of unresponsiveness with EMS reporting patient reported being out of Keppra for the past week.  Patient endorsed compliance but was only taking 1 tablet daily when he was supposed to be taking 2 tablets twice daily.  MRI unremarkable for acute stroke. He returned on 08/22/2018 with altered mental status with aggression and paranoia.  Per ED note reviewed, presentation significantly different from prior presentations with seizures or strokes.  He required psychiatric evaluation and was transferred to [facility] for further psychiatric evaluation and treatment.  [name] Cruz is being seen today for hospital follow-up regarding reoccurring stroke and seizure activity and is accompanied by his sister.  He has been doing well at home without any reoccurring stroke/TIA symptoms or seizure activity.  He does endorse occasional speech difficulty but feels as though this has been improving.  He also has right peripheral vision loss which has been chronic from prior stroke.  He continues to live with his sister, [name] Cruz, but is able to maintain majority of ADLs independently.  He does have assistance for IADLs.  Sister helps with medication administration with use of pillboxes.  Sister believes cognition/memory loss has been slowly declining.  MMSE today 9/30.  He continue to perseverate on the same thought content such as left-sided vision loss and then an area on his nose that continued to bleed while he was on blood thinners.  He has completed 3 weeks DAPT and currently on aspirin 81 mg daily  along.  Not recommended by other providers to be on Plavix due to history of ICH and microhemorrhages.  Underlying history of PAF and not a candidate for anticoagulation due to microhemorrhages.  He continues on atorvastatin without myalgias.  Blood pressure today 140/84.  He has continued on Vimpat 100 mg a.m. and 200 mg p.m. along with Keppra 1500 mg twice daily tolerating well without seizure activity.  Mood has been stable with ongoing use of sertraline and Geodon which continues to be managed by PCP.  Patient is very pleasant during appointment and cooperative with history taking, memory evaluation and physical exam.  No further concerns at this time.     ROS:   14 system review of systems is positive for speech difficulty, memory loss, confusion and all systems negative  PMH:  Past Medical History:  Diagnosis Date  . Acute deep vein thrombosis (DVT) of left upper extremity (HCC)    L brachial and basilic veins, eliquis started 08/22/2017 to continue for 3 months total   . Cognitive impairment 2007   after stroke, saw rehab but told to stop because was too upsetting to him  . History of chicken pox   . HLD (hyperlipidemia)   . HTN (hypertension)   . NSTEMI (non-ST elevated myocardial infarction) (HCC) 02/21/2017  . Obesity   .  Stroke, hemorrhagic (HCC) 2007   thought 2/2 HTN (240sbp); residual cognitive impairment, loss of R peripheral field, no driving    Social History:  Social History   Socioeconomic History  . Marital status: Single    Spouse name: Not on file  . Number of children: 2  . Years of education: Not on file  . Highest education level: 10th grade  Occupational History    Comment: Unemployed   Social Needs  . Financial resource strain: Not on file  . Food insecurity    Worry: Not on file    Inability: Not on file  . Transportation needs    Medical: Not on file    Non-medical: Not on file  Tobacco Use  . Smoking status: Never Smoker  . Smokeless tobacco:  Former Engineer, water and Sexual Activity  . Alcohol use: Not Currently  . Drug use: No  . Sexual activity: Not Currently  Lifestyle  . Physical activity    Days per week: Not on file    Minutes per session: Not on file  . Stress: Not on file  Relationships  . Social Musician on phone: Not on file    Gets together: Not on file    Attends religious service: Not on file    Active member of club or organization: Not on file    Attends meetings of clubs or organizations: Not on file    Relationship status: Not on file  . Intimate partner violence    Fear of current or ex partner: Not on file    Emotionally abused: Not on file    Physically abused: Not on file    Forced sexual activity: Not on file  Other Topics Concern  . Not on file  Social History Narrative   Caffeine: 1 pot coffee   Lives with oldest sister in [place]   Occupation: disabled, was Corporate investment banker   Does not drive.   Activity: no regular exercise   Diet: good water, drinks V8 juice   Right handed     Medications:   Current Outpatient Medications on File Prior to Visit  Medication Sig Dispense Refill  . atorvastatin (LIPITOR) 80 MG tablet Take 1 tablet (80 mg total) by mouth daily. 90 tablet 3  . carvedilol (COREG) 3.125 MG tablet Take 1 tablet (3.125 mg total) by mouth 2 (two) times daily. 180 tablet 3  . Cyanocobalamin (B-12) 1000 MCG SUBL Place 1 tablet under the tongue daily. (Patient taking differently: Place 1,000 mcg under the tongue daily. ) 90 each 0  . Lacosamide (VIMPAT) 100 MG TABS Take 1 tablet in the morning and 2 at night (total  daily) (Patient taking differently: Take 100-200 mg by mouth See admin instructions. Take 1 tablet in the morning and take 2 tablets at night) 90 tablet 0  . levETIRAcetam (KEPPRA) 750 MG tablet TAKE 2 TABLETS (1,500 MG TOTAL) BY MOUTH 2 (TWO) TIMES DAILY. 120 tablet 0  . Multiple Vitamin (MULTIVITAMIN WITH MINERALS) TABS tablet Take 1 tablet by mouth  daily. 90 tablet 0  . sertraline (ZOLOFT) 25 MG tablet Take 1 tablet (25 mg total) by mouth at bedtime.    . ziprasidone (GEODON) 40 MG capsule Take 1 capsule (40 mg total) by mouth at bedtime. 90 capsule 1   No current facility-administered medications on file prior to visit.     Allergies:   Allergies  Allergen Reactions  . Losartan Other (See Comments)    hyperkalemia  Physical Exam  Today's Vitals   10/22/18 1254  BP: 140/84  Pulse: (!) 53  Temp: 99.8 F (37.7 C)  TempSrc: Oral  Weight: 167 lb 9.6 oz (76 kg)  Height: 5\' 3"  (1.6 m)   Body mass index is 29.69 kg/m.  MMSE - Mini Mental State Exam 10/22/2018 06/17/2018  Not completed: - (No Data)  Orientation to time 2 -  Orientation to Place 1 -  Registration 2 -  Attention/ Calculation 0 -  Recall 2 -  Language- name 2 objects 2 -  Language- repeat 0 -  Language- follow 3 step command 0 -  Language- read & follow direction 0 -  Write a sentence 0 -  Copy design 0 -  Total score 9 -    General: well developed, well nourished, pleasant middle-age pleasant Caucasian male, seated, in no evident distress Head: head normocephalic and atraumatic.   Neck: supple with no carotid or supraclavicular bruits Cardiovascular: regular rate and rhythm, no murmurs Musculoskeletal: no deformity Skin:  no rash/petichiae  Vascular:  Normal pulses all extremities  Neurologic Exam Mental Status: Awake and fully alert.  Mild expressive aphasia.  Oriented to place and time. Recent and remote memoryi dminished.  Attention span, concentration and fund of knowledge diminished with continuously repeating and speaking off topic.  MMSE 9/30 as charted above.  Mood and affect appropriate.  Cranial Nerves: Pupils equal, briskly reactive to light. Extraocular movements full without nystagmus. Visual fields show dense right homonymous hemianopsia to confrontation. Hearing intact. Facial sensation intact. Face, tongue, palate moves normally and  symmetrically.  Motor: Normal bulk and tone. Normal strength in all tested extremity muscles. Sensory.: intact to touch , pinprick , position and vibratory sensation.  Coordination: Rapid alternating movements normal in all extremities. Finger-to-nose and heel-to-shin performed accurately bilaterally. Gait and Station: Arises from chair without difficulty. Stance is normal. Gait demonstrates normal stride length and balance without use of assistive device.  Reflexes: 1+ and symmetric. Toes downgoing.       ASSESSMENT:  63 year old Caucasian male with recent punctate acute/subacute white matter infarcts within right PCA territory secondary to small vessel disease in 07/2018 with history of CAD status post CABG in 02/2016, multiple hemorrhagic strokes with subsequent cognitive impairment, DVT in 2019, seizure disorder, HTN, HLD and PAF not on AC.  Recent hospitalizations for altered mental status with seizure activity and psychosis.  He has been stable since hospitalization without any reoccurring seizure activity or psychosis.  MMSE at today's visit 9/30.     PLAN: -MRI w/wo contrast to assess for possible CAA with multiple microhemorrhages, prior ICH, multiple strokes and cognitive impairment -Repeat EEG as prior EEG abnormal -Referral for neurocognitive evaluation as difficulty adequately assessing cognition with underlying speech difficulty -Continue Vimpat 100 mg a.m. and 200 mg p.m. and Keppra 1500 mg twice daily for seizure prophylaxis -refill placed -Continue to follow with cardiology for atrial fibrillation monitoring -not a candidate for anticoagulation due to history of ICH and multiple microhemorrhages -Continue aspirin 81 mg and atorvastatin for secondary stroke prevention -Continue to follow-up with PCP in regards to HLD and HTN management -Continue to stay active and maintain a healthy diet -Continue to monitor blood pressure at home -Maintain strict control of hypertension with  blood pressure goal below 130/90, diabetes with hemoglobin A1c goal below 6.5% and cholesterol with LDL cholesterol (bad cholesterol) goal below 70 mg/dL. I also advised the patient to eat a healthy diet with plenty of whole grains, cereals, fruits and  vegetables, exercise regularly and maintain ideal body weight.  Follow-up in 4 months or call earlier if needed   Greater than 50% time during this 25 minute consultation visit was spent on discussing recent hospitalizations with reoccurring stroke and seizures, evaluation of mental status with MMSE and discussion of results, discussion on ongoing secondary stroke prevention risk factor management and importance and answered all questions to patient and sisters satisfaction   Ihor Austin, AGNP-BC  [facility] [address] [place], [place] [address]  Phone [phone] Fax [phone] Note: This document was prepared with digital dictation and possible smart phrase technology. Any transcriptional errors that result from this process are unintentional.

## 2018-10-23 ENCOUNTER — Ambulatory Visit: Payer: Self-pay | Admitting: Adult Health

## 2018-10-23 NOTE — Progress Notes (Signed)
I agree with the above plan 

## 2018-11-10 ENCOUNTER — Ambulatory Visit: Payer: Self-pay | Admitting: Adult Health

## 2018-11-11 ENCOUNTER — Ambulatory Visit
Admission: RE | Admit: 2018-11-11 | Discharge: 2018-11-11 | Disposition: A | Discharge status: Home / Self Care | Payer: Medicare Other | Source: Ambulatory Visit | Attending: Adult Health | Admitting: Adult Health

## 2018-11-11 ENCOUNTER — Other Ambulatory Visit: Payer: Self-pay

## 2018-11-11 DIAGNOSIS — R413 Other amnesia: Secondary | ICD-10-CM | POA: Diagnosis not present

## 2018-11-11 MED ORDER — GADOBENATE DIMEGLUMINE 529 MG/ML IV SOLN
15.0000 mL | Freq: Once | INTRAVENOUS | Status: AC | PRN
  Administered 2018-11-11: 13:00:00 15 mL via INTRAVENOUS

## 2018-11-16 ENCOUNTER — Other Ambulatory Visit: Payer: Medicare Other

## 2018-11-18 ENCOUNTER — Telehealth: Payer: Self-pay | Admitting: *Deleted

## 2018-11-18 NOTE — Telephone Encounter (Signed)
LVM requesting call back for MRI results. 

## 2018-11-19 NOTE — Telephone Encounter (Addendum)
Returned call to sister, [name] and informed her that his recent MRI showed multiple chronic microhemorrhages which are likely due to poorly controlled hypertension due to the areas they are located. These could potentially be related to cerebral amyloid but less likely (cerebral amyloid versus hypertension etiology discussed at prior visit). There's no change in his treatment. I advised her of the importance of blood pressure control, avoidance of blood thinning medications besides his daily aspirin and cholesterol management. She verbalized understanding, appreciation.

## 2018-11-19 NOTE — Telephone Encounter (Signed)
Phone rep checked office voicemail;Pt sister(on DPR) has called for results, she is asking for a call back at [phone]  this voicemail was left @ 11:02a.m.

## 2018-12-04 ENCOUNTER — Other Ambulatory Visit: Payer: Self-pay | Admitting: Family Medicine

## 2018-12-12 ENCOUNTER — Other Ambulatory Visit: Payer: Self-pay | Admitting: Family Medicine

## 2018-12-14 ENCOUNTER — Encounter: Payer: Self-pay | Admitting: Family Medicine

## 2018-12-14 DIAGNOSIS — I739 Peripheral vascular disease, unspecified: Secondary | ICD-10-CM | POA: Insufficient documentation

## 2018-12-15 NOTE — Telephone Encounter (Signed)
Not on current med list  Plavix Last rx:  08/10/18, #90/1 to [facility] Last OV:  09/04/18, [place] f/u Next OV:  12/23/18, 6 mo f/u

## 2018-12-17 MED ORDER — ASPIRIN EC 81 MG PO TBEC: 81.0000 mg | DELAYED_RELEASE_TABLET | Freq: Every day | ORAL | Status: DC

## 2018-12-17 NOTE — Telephone Encounter (Signed)
Spoke with pt's sis, [name] (on dpr), asking about Plavix and aspirin.  States pt is still taking both Plavix and aspirin.  Fyi to Dr. Darnell Level.

## 2018-12-17 NOTE — Telephone Encounter (Signed)
I declined plavix refill as it seems pt is no long on this medication. Will forward to neurology to verify he should no longer be taking plavix.

## 2018-12-17 NOTE — Telephone Encounter (Signed)
That is correct.  He should only currently be on aspirin at this time which was discussed at prior visit and he did not mention currently being on Plavix in addition to his aspirin

## 2018-12-17 NOTE — Addendum Note (Signed)
Addended by: Ria Bush on: 12/17/2018 01:44 PM   Modules accepted: Orders

## 2018-12-17 NOTE — Telephone Encounter (Signed)
Plz call sister to verify he is no longer on plavix and only taking aspirin 81mg  daily.

## 2018-12-18 NOTE — Telephone Encounter (Signed)
Please notify patient I touched base with neurology and recommend he only take aspirin 81mg  daily, may stop plavix.

## 2018-12-18 NOTE — Telephone Encounter (Signed)
Agree. Don't recommend 21 days but 14 days quarantine since last contact with sister.

## 2018-12-18 NOTE — Telephone Encounter (Signed)
Spoke to [place] and she is aware as instructed and expressed understanding.... also wanted to let us know that we will need to cancel appt for next week as sister [name] was positive for covid and pt had contact with her last week. I did advise her that she would have to wait until after 21 days before being able to come into the office and without Sx.... she expressed understanding and appt canceled

## 2018-12-23 ENCOUNTER — Ambulatory Visit: Payer: Medicare Other | Admitting: Family Medicine

## 2019-01-02 ENCOUNTER — Other Ambulatory Visit: Payer: Self-pay | Admitting: Adult Health

## 2019-01-22 ENCOUNTER — Telehealth: Payer: Self-pay

## 2019-01-22 MED ORDER — SERTRALINE HCL 25 MG PO TABS: 25.0000 mg | ORAL_TABLET | Freq: Every day | ORAL | 1 refills | Status: DC

## 2019-01-22 NOTE — Telephone Encounter (Signed)
[name], called and left a message stating that Sertraline 25 mg RX was sent in for 1/2 tablet daily but she had papers that said he is to take 1 tablet daily. They need to know which is right, patient is out of the medication. Please review.

## 2019-01-22 NOTE — Telephone Encounter (Signed)
They are correct - pt should be on sertraline 25mg  1 tablet daily. New instructions sent to pharmacy.

## 2019-01-22 NOTE — Telephone Encounter (Signed)
Brenda advised

## 2019-01-22 NOTE — Telephone Encounter (Signed)
Noted  

## 2019-02-21 ENCOUNTER — Encounter: Payer: Self-pay | Admitting: Family Medicine

## 2019-02-21 DIAGNOSIS — Z86718 Personal history of other venous thrombosis and embolism: Secondary | ICD-10-CM | POA: Insufficient documentation

## 2019-02-22 ENCOUNTER — Ambulatory Visit: Payer: Medicare Other | Admitting: Adult Health

## 2019-02-27 ENCOUNTER — Other Ambulatory Visit: Payer: Self-pay | Admitting: Family Medicine

## 2019-03-01 ENCOUNTER — Telehealth: Payer: Self-pay

## 2019-03-01 MED ORDER — ATORVASTATIN CALCIUM 80 MG PO TABS: 80.0000 mg | ORAL_TABLET | Freq: Every day | ORAL | 1 refills | Status: DC

## 2019-03-01 MED ORDER — CARVEDILOL 3.125 MG PO TABS: 3.1250 mg | ORAL_TABLET | Freq: Two times a day (BID) | ORAL | 1 refills | Status: DC

## 2019-03-01 NOTE — Telephone Encounter (Signed)
Received fax from [facility] on [address] reporting pt needs new script sent for lipitor and coreg due to pt changing to their pharmacy.    LAST REFILL: 08/07/18 LAST OV:09-04-18 NEXT OV: None  Sent in scripts

## 2019-03-05 ENCOUNTER — Telehealth: Payer: Self-pay | Admitting: Family Medicine

## 2019-03-05 NOTE — Progress Notes (Signed)
°  Chronic Care Management   Outreach Note  03/05/2019 Name: DARRIUS MONTANO MRN: [id number] DOB: [birth year]  Referred by: [name], MD Reason for referral : No chief complaint on file.   An unsuccessful telephone outreach was attempted today. The patient was referred to the pharmacist for assistance with care management and care coordination.   Follow Up Plan:   Raynicia Dukes [employer] Scheduler

## 2019-03-05 NOTE — Chronic Care Management (AMB) (Signed)
  Chronic Care Management   Note  03/05/2019 Name: [name] Cruz MRN: [id number] DOB: 1955/11/17  [name] Cruz is a 64 y.o. year old male who is a primary care patient of Ria Bush, MD. I reached out to [name] by phone today in response to a referral sent by [name] Cruz's PCP, Ria Bush, MD. Sister, [name] Cruz is his guardian, she gave verbal consent for CCM. She will be talking to the Pharmacist.  [name] Cruz was given information about Chronic Care Management services today including:  1. CCM service includes personalized support from designated clinical staff supervised by his physician, including individualized plan of care and coordination with other care providers 2. 24/7 contact phone numbers for assistance for urgent and routine care needs. 3. Service will only be billed when office clinical staff spend 20 minutes or more in a month to coordinate care. 4. Only one practitioner may furnish and bill the service in a calendar month. 5. The patient may stop CCM services at any time (effective at the end of the month) by phone call to the office staff. 6. The patient will be responsible for cost sharing (co-pay) of up to 20% of the service fee (after annual deductible is met).  Patient agreed to services and verbal consent obtained.   Follow up plan:   Raynicia Dukes [employer] Scheduler

## 2019-03-12 ENCOUNTER — Telehealth: Payer: Self-pay

## 2019-03-12 DIAGNOSIS — I480 Paroxysmal atrial fibrillation: Secondary | ICD-10-CM

## 2019-03-12 DIAGNOSIS — I10 Essential (primary) hypertension: Secondary | ICD-10-CM

## 2019-03-12 NOTE — Telephone Encounter (Signed)
I would like to request a referral for ATHA MCBAIN to chronic care management pharmacy services for the following conditions:   Essential hypertension, benign  [I10]  Atrial fibrillation (HCC) [I48.91]  [name], PharmD Clinical Pharmacist [facility] [phone]

## 2019-03-15 ENCOUNTER — Other Ambulatory Visit: Payer: Self-pay

## 2019-03-15 ENCOUNTER — Ambulatory Visit: Payer: Medicare Other

## 2019-03-15 DIAGNOSIS — R4189 Other symptoms and signs involving cognitive functions and awareness: Secondary | ICD-10-CM

## 2019-03-15 DIAGNOSIS — I10 Essential (primary) hypertension: Secondary | ICD-10-CM

## 2019-03-15 DIAGNOSIS — G40909 Epilepsy, unspecified, not intractable, without status epilepticus: Secondary | ICD-10-CM

## 2019-03-15 DIAGNOSIS — I480 Paroxysmal atrial fibrillation: Secondary | ICD-10-CM

## 2019-03-15 DIAGNOSIS — I214 Non-ST elevation (NSTEMI) myocardial infarction: Secondary | ICD-10-CM

## 2019-03-15 DIAGNOSIS — I739 Peripheral vascular disease, unspecified: Secondary | ICD-10-CM

## 2019-03-15 DIAGNOSIS — E782 Mixed hyperlipidemia: Secondary | ICD-10-CM

## 2019-03-15 NOTE — Chronic Care Management (AMB) (Signed)
Chronic Care Management Pharmacy  Name: [name] Cruz  MRN: [id number] DOB: [birth year]  Chief Complaint/ HPI  [name] Cruz,  64 y.o. , male presents for their Initial CCM visit with the clinical pharmacist via telephone.  PCP : [name], MD  Their chronic conditions include: hypertension, coronary artery disease, AFIB, peripheral artery disease, seizure disorder, hyperlipidemia, cognitive impairment  Patient concerns: denies medication concerns  Office Visits:  09/04/18: hospital follow up - pt was started on Geodon 40 mg qHs due to AMS, aggression, continues sertraline 25 mg daily - monitor A1c   Consult Visit:  10/22/18: Neurology - Continue Vimpat 100 mg a.m. and 200 mg p.m. and Keppra 1500 mg twice daily for seizure prophylaxis -refill placed; Continue to follow with cardiology for atrial fibrillation monitoring; Continue aspirin 81 mg and atorvastatin for secondary stroke prevention; referral for neurocognitive eval placed  Allergies  Allergen Reactions  . Losartan Other (See Comments)    hyperkalemia   Medications: Outpatient Encounter Medications as of 03/15/2019  Medication Sig  . aspirin EC 81 MG tablet Take 1 tablet (81 mg total) by mouth daily.  Marland Kitchen atorvastatin (LIPITOR) 80 MG tablet Take 1 tablet (80 mg total) by mouth daily.  . carvedilol (COREG) 3.125 MG tablet Take 1 tablet (3.125 mg total) by mouth 2 (two) times daily.  . Cyanocobalamin (B-12) 1000 MCG SUBL Place 1 tablet under the tongue daily. (Patient taking differently: Place 1,000 mcg under the tongue daily. )  . Lacosamide (VIMPAT) 100 MG TABS Take 1 tablet in the morning and 2 at night (total 300mg  daily)  . levETIRAcetam (KEPPRA) 750 MG tablet Take 2 tablets (1,500 mg total) by mouth 2 (two) times daily.  . Multiple Vitamin (MULTIVITAMIN WITH MINERALS) TABS tablet Take 1 tablet by mouth daily.  . sertraline (ZOLOFT) 25 MG tablet Take 1 tablet (25 mg total) by mouth daily.  . ziprasidone (GEODON)  40 MG capsule TAKE 1 CAPSULE (40 MG TOTAL) BY MOUTH AT BEDTIME.   No facility-administered encounter medications on file as of 03/15/2019.   Current Diagnosis/Assessment: Goals    . Patient Stated     Starting 06/17/18, I will continue to take medications as prescribed.     . Pharmacy Care Plan     Current Barriers:  . Chronic Disease Management support, education, and care coordination needs related to   Pharmacist Clinical Goal(s):  08/17/18 Pharmacist review of medications every 6 months. . Remain up to date on vaccinations. Recommend 2 dose series of shingles vaccine from local pharmacy. . Maintain blood pressure within goal of less than 140/90 mmHg. Check blood pressure and heart rate with home monitor weekly. Contact the office if blood pressure less than 90/60 mmHg or consistently above 140/90 mmHg. Marland Kitchen Appropriately monitor for side effects of ziprasidone. Continue to monitor fasting blood glucose, weight, waist circumference, blood pressure, and cholesterol annually.  Interventions: . Comprehensive medication review performed.  Patient Self Care Activities:  . Self administers medications as prescribed with help of caregivers . Self-monitors blood pressure  Initial goal documentation       AFIB  Followed by cardiology Patient is currently rate controlled. Patient has failed these meds in past: none Patient is currently controlled on the following medications:   Carvedilol 3.125 mg - BID  We discussed: per neuro - not a candidate for anticoagulation due to history of ICH and multiple microhemorrhages  Plan: Continue current medications  Hyperlipidemia/CAD   Lipid Panel     Component Value  Date/Time   CHOL 120 07/09/2018 0416   CHOL 177 07/11/2017 0950   TRIG 78 07/09/2018 0416   HDL 35 (L) 07/09/2018 0416   HDL 96 07/11/2017 0950   CHOLHDL 3.4 07/09/2018 0416   VLDL 16 07/09/2018 0416   LDLCALC 69 07/09/2018 0416   LDLCALC 65 07/11/2017 0950   LDLDIRECT 122.0  07/22/2016 0959   LABVLDL 16 07/11/2017 0950    History of NSTEMI, CVA, CABG LDL Goal < 70 Patient has failed these meds in past: none Patient is currently controlled on the following medications:   Atorvastatin 80 mg - once daily  Aspirin 81 mg - once daily   Plan: Continue current medications  Hypertension   Office blood pressures are  BP Readings from Last 3 Encounters:  10/22/18 140/84  09/04/18 113/65  08/23/18 (!) 152/61   CMP Latest Ref Rng & Units 08/22/2018 08/19/2018 08/07/2018  Glucose 70 - 99 mg/dL 107(H) 158(H) 97  BUN 8 - 23 mg/dL 8 10 5(L)  Creatinine 0.61 - 1.24 mg/dL 0.83 0.81 0.70  Sodium 135 - 145 mmol/L 136 131(L) 138  Potassium 3.5 - 5.1 mmol/L 3.5 4.4 4.3  Chloride 98 - 111 mmol/L 101 96(L) 101  CO2 22 - 32 mmol/L 21(L) 24 -  Calcium 8.9 - 10.3 mg/dL 9.4 9.3 -  Total Protein 6.5 - 8.1 g/dL 7.3 7.0 -  Total Bilirubin 0.3 - 1.2 mg/dL 0.5 0.9 -  Alkaline Phos 38 - 126 U/L 62 68 -  AST 15 - 41 U/L 23 19 -  ALT 0 - 44 U/L 21 21 -   BP goal < 140/90 mmHg Patient has failed these meds in the past: losartan (hyperkalemia) Patient checks BP at home infrequently, wrist monitor at home Patient home BP readings are ranging: none reported, 'pretty good'  Patient is currently controlled on the following medications:   Carvedilol 3.125 mg - BID  Plan: Continue current medications   Seizures   Per sister, no recent seizure activity since hospitalization 08/2018 Patient has failed these meds in past: none Patient is currently controlled on the following medications:   Levetiracetam 750 mg - 2 tablets twice daily  Lacosamide 100 mg - 1 tablet in the morning and 2 tabs at night  Plan: Continue current medications   Cognitive Impairment/ Mood   Patient has failed these meds in past: none Patient is currently controlled on the following medications:   Ziprasidone 40 mg - 1 tablet at bedtime  Sertraline 25 mg - 1 tablet daily   We discussed: per sister,  mood is stable and well controlled  Plan: Continue current medications; Check A1c at next office visit.  Medication Management  OTCs: multivitamin, B12 1000 mcg SL   [facility] [address] [facility], [facility] well for [facility]  Social support: Ortencia Kick, picks them up every Friday and fills pillboxes (2 weekly)  Affordability: no cost concerns  Vaccines: Recommend Shingrix   CCM Follow Up: July 30, 2019 at 3:30 PM (telephone)  Debbora Dus, PharmD Clinical Pharmacist [facility] [phone]

## 2019-03-15 NOTE — Patient Instructions (Addendum)
Dear [name] Cruz,  It was a pleasure meeting your sister, [name] Cruz, during our initial appointment on March 15, 2019. Below is a summary of the goals we discussed and components of chronic care management. Please contact me anytime with questions or concerns.   Visit Information  Goals Addressed            This Visit's Progress   . Pharmacy Care Plan       Current Barriers:  . Chronic Disease Management support, education, and care coordination needs related to   Pharmacist Clinical Goal(s):  Marland Kitchen Pharmacist review of medications every 6 months. . Remain up to date on vaccinations. Recommend 2 dose series of shingles vaccine from local pharmacy. . Maintain blood pressure within goal of less than 140/90 mmHg. Check blood pressure and heart rate with home monitor weekly. Contact the office if blood pressure less than 90/60 mmHg or consistently above 140/90 mmHg. Marland Kitchen Appropriately monitor for side effects of ziprasidone. Continue to monitor fasting blood glucose, weight, waist circumference, blood pressure, and cholesterol annually.  Interventions: . Comprehensive medication review performed.  Patient Self Care Activities:  . Self administers medications as prescribed with help of caregivers . Self-monitors blood pressure  Initial goal documentation        [name] Cruz was given information about Chronic Care Management services today including:  1. CCM service includes personalized support from designated clinical staff supervised by his physician, including individualized plan of care and coordination with other care providers 2. 24/7 contact phone numbers for assistance for urgent and routine care needs. 3. Service will only be billed when office clinical staff spend 20 minutes or more in a month to coordinate care. 4. Only one practitioner may furnish and bill the service in a calendar month. 5. The patient may stop CCM services at any time (effective at the end of the  month) by phone call to the office staff. 6. The patient will be responsible for cost sharing (co-pay) of up to 20% of the service fee (after annual deductible is met).  Patient agreed to services and verbal consent obtained.   Print copy of patient instructions provided.  Telephone follow up appointment with pharmacy team member scheduled for: July 30, 2019 at 3:30 PM (telephone)  Debbora Dus, PharmD Clinical Pharmacist [facility]  Recombinant Zoster (Shingles) Vaccine: What You Need to Know 1. Why get vaccinated? Recombinant zoster (shingles) vaccine can prevent shingles. Shingles (also called herpes zoster, or just zoster) is a painful skin rash, usually with blisters. In addition to the rash, shingles can cause fever, headache, chills, or upset stomach. More rarely, shingles can lead to pneumonia, hearing problems, blindness, brain inflammation (encephalitis), or death. The most common complication of shingles is long-term nerve pain called postherpetic neuralgia (PHN). PHN occurs in the areas where the shingles rash was, even after the rash clears up. It can last for months or years after the rash goes away. The pain from PHN can be severe and debilitating. About 10 to 18% of people who get shingles will experience PHN. The risk of PHN increases with age. An older adult with shingles is more likely to develop PHN and have longer lasting and more severe pain than a younger person with shingles. Shingles is caused by the varicella zoster virus, the same virus that causes chickenpox. After you have chickenpox, the virus stays in your body and can cause shingles later in life. Shingles cannot be passed from one  person to another, but the virus that causes shingles can spread and cause chickenpox in someone who had never had chickenpox or received chickenpox vaccine. 2. Recombinant shingles vaccine Recombinant shingles vaccine provides strong protection  against shingles. By preventing shingles, recombinant shingles vaccine also protects against PHN. Recombinant shingles vaccine is the preferred vaccine for the prevention of shingles. However, a different vaccine, live shingles vaccine, may be used in some circumstances. The recombinant shingles vaccine is recommended for adults 50 years and older without serious immune problems. It is given as a two-dose series. This vaccine is also recommended for people who have already gotten another type of shingles vaccine, the live shingles vaccine. There is no live virus in this vaccine. Shingles vaccine may be given at the same time as other vaccines. 3. Talk with your health care provider Tell your vaccine provider if the person getting the vaccine:  Has had an allergic reaction after a previous dose of recombinant shingles vaccine, or has any severe, life-threatening allergies.  Is pregnant or breastfeeding.  Is currently experiencing an episode of shingles. In some cases, your health care provider may decide to postpone shingles vaccination to a future visit. People with minor illnesses, such as a cold, may be vaccinated. People who are moderately or severely ill should usually wait until they recover before getting recombinant shingles vaccine. Your health care provider can give you more information. 4. Risks of a vaccine reaction  A sore arm with mild or moderate pain is very common after recombinant shingles vaccine, affecting about 80% of vaccinated people. Redness and swelling can also happen at the site of the injection.  Tiredness, muscle pain, headache, shivering, fever, stomach pain, and nausea happen after vaccination in more than half of people who receive recombinant shingles vaccine. In clinical trials, about 1 out of 6 people who got recombinant zoster vaccine experienced side effects that prevented them from doing regular activities. Symptoms usually went away on their own in 2 to 3  days. You should still get the second dose of recombinant zoster vaccine even if you had one of these reactions after the first dose. People sometimes faint after medical procedures, including vaccination. Tell your provider if you feel dizzy or have vision changes or ringing in the ears. As with any medicine, there is a very remote chance of a vaccine causing a severe allergic reaction, other serious injury, or death. 5. What if there is a serious problem? An allergic reaction could occur after the vaccinated person leaves the clinic. If you see signs of a severe allergic reaction (hives, swelling of the face and throat, difficulty breathing, a fast heartbeat, dizziness, or weakness), call 9-1-1 and get the person to the nearest hospital. For other signs that concern you, call your health care provider. Adverse reactions should be reported to the Vaccine Adverse Event Reporting System (VAERS). Your health care provider will usually file this report, or you can do it yourself. Visit the VAERS website at www.vaers.SamedayNews.es or call 352-473-4819. VAERS is only for reporting reactions, and VAERS staff do not give medical advice. 6. How can I learn more?  Ask your health care provider.  Call your local or state health department.  Contact the Centers for Disease Control and Prevention (CDC): ? Call (573)738-0031 (1-800-CDC-INFO) or ? Visit CDC's website at http://hunter.com/ Vaccine Information Statement Recombinant Zoster Vaccine (11/12/2017) This information is not intended to replace advice given to you by your health care provider. Make sure you discuss any questions you  have with your health care provider. Document Revised: 04/21/2018 Document Reviewed: 08/06/2017 Elsevier Patient Education  [place].

## 2019-04-24 ENCOUNTER — Other Ambulatory Visit: Payer: Self-pay | Admitting: Family Medicine

## 2019-05-03 ENCOUNTER — Other Ambulatory Visit: Payer: Self-pay | Admitting: Adult Health

## 2019-05-03 ENCOUNTER — Telehealth: Payer: Self-pay | Admitting: *Deleted

## 2019-05-03 MED ORDER — VIMPAT 100 MG PO TABS: ORAL_TABLET | ORAL | 1 refills | Status: DC

## 2019-05-03 NOTE — Telephone Encounter (Signed)
1) Medication(s) Requested (by name): Lacosamide (VIMPAT) 100 MG TABS   2) [facility]: [id number] [place], [place] - [address]  [address] [place] [address]  3) Special Requests:  Pt sister states Pt had a minor seizure over the weekend due to not having anymore pills

## 2019-05-03 NOTE — Telephone Encounter (Signed)
Patient's sister [name] (on [place]) called stating that she requested a refill on patient's seizure medication twice last week. Steward Drone stated that she was told by the pharmacist that they had sent the request to the doctor and have not heard anything back. Warren Lacy that we have not gotten anything from the pharmacy and this medication should be refilled by the neurologist. Steward Drone stated that she will contact her brother's neurologist now.

## 2019-05-27 ENCOUNTER — Emergency Department (HOSPITAL_COMMUNITY): Payer: Medicare Other

## 2019-05-27 ENCOUNTER — Encounter (HOSPITAL_COMMUNITY): Payer: Self-pay

## 2019-05-27 ENCOUNTER — Emergency Department (HOSPITAL_COMMUNITY)
Admission: EM | Admit: 2019-05-27 | Discharge: 2019-05-27 | Disposition: A | Discharge status: Home / Self Care | Payer: Medicare Other | Attending: Emergency Medicine | Admitting: Emergency Medicine

## 2019-05-27 ENCOUNTER — Other Ambulatory Visit: Payer: Self-pay

## 2019-05-27 DIAGNOSIS — R4781 Slurred speech: Secondary | ICD-10-CM | POA: Diagnosis not present

## 2019-05-27 DIAGNOSIS — Z7982 Long term (current) use of aspirin: Secondary | ICD-10-CM | POA: Insufficient documentation

## 2019-05-27 DIAGNOSIS — R401 Stupor: Secondary | ICD-10-CM | POA: Diagnosis not present

## 2019-05-27 DIAGNOSIS — R0689 Other abnormalities of breathing: Secondary | ICD-10-CM | POA: Diagnosis not present

## 2019-05-27 DIAGNOSIS — R569 Unspecified convulsions: Secondary | ICD-10-CM

## 2019-05-27 DIAGNOSIS — I2510 Atherosclerotic heart disease of native coronary artery without angina pectoris: Secondary | ICD-10-CM | POA: Diagnosis not present

## 2019-05-27 DIAGNOSIS — R531 Weakness: Secondary | ICD-10-CM | POA: Diagnosis not present

## 2019-05-27 DIAGNOSIS — Z79899 Other long term (current) drug therapy: Secondary | ICD-10-CM | POA: Insufficient documentation

## 2019-05-27 DIAGNOSIS — R404 Transient alteration of awareness: Secondary | ICD-10-CM | POA: Diagnosis not present

## 2019-05-27 DIAGNOSIS — Z951 Presence of aortocoronary bypass graft: Secondary | ICD-10-CM | POA: Diagnosis not present

## 2019-05-27 DIAGNOSIS — I10 Essential (primary) hypertension: Secondary | ICD-10-CM | POA: Insufficient documentation

## 2019-05-27 DIAGNOSIS — I6501 Occlusion and stenosis of right vertebral artery: Secondary | ICD-10-CM | POA: Diagnosis not present

## 2019-05-27 DIAGNOSIS — Z8673 Personal history of transient ischemic attack (TIA), and cerebral infarction without residual deficits: Secondary | ICD-10-CM | POA: Insufficient documentation

## 2019-05-27 DIAGNOSIS — I6523 Occlusion and stenosis of bilateral carotid arteries: Secondary | ICD-10-CM | POA: Diagnosis not present

## 2019-05-27 DIAGNOSIS — I252 Old myocardial infarction: Secondary | ICD-10-CM | POA: Diagnosis not present

## 2019-05-27 DIAGNOSIS — Z743 Need for continuous supervision: Secondary | ICD-10-CM | POA: Diagnosis not present

## 2019-05-27 LAB — URINALYSIS, ROUTINE W REFLEX MICROSCOPIC
Bilirubin Urine: NEGATIVE
Glucose, UA: NEGATIVE mg/dL
Hgb urine dipstick: NEGATIVE
Ketones, ur: NEGATIVE mg/dL
Leukocytes,Ua: NEGATIVE
Nitrite: NEGATIVE
Protein, ur: NEGATIVE mg/dL
Specific Gravity, Urine: 1.023 (ref 1.005–1.030)
pH: 6 (ref 5.0–8.0)

## 2019-05-27 LAB — COMPREHENSIVE METABOLIC PANEL
ALT: 24 U/L (ref 0–44)
AST: 23 U/L (ref 15–41)
Albumin: 4.2 g/dL (ref 3.5–5.0)
Alkaline Phosphatase: 53 U/L (ref 38–126)
Anion gap: 13 (ref 5–15)
BUN: 5 mg/dL — ABNORMAL LOW (ref 8–23)
CO2: 23 mmol/L (ref 22–32)
Calcium: 9.4 mg/dL (ref 8.9–10.3)
Chloride: 105 mmol/L (ref 98–111)
Creatinine, Ser: 0.8 mg/dL (ref 0.61–1.24)
GFR calc Af Amer: >60 mL/min (ref >60)
GFR calc non Af Amer: >60 mL/min (ref >60)
Glucose, Bld: 106 mg/dL — ABNORMAL HIGH (ref 70–99)
Potassium: 5 mmol/L (ref 3.5–5.1)
Sodium: 141 mmol/L (ref 135–145)
Total Bilirubin: 0.8 mg/dL (ref 0.3–1.2)
Total Protein: 6.9 g/dL (ref 6.5–8.1)

## 2019-05-27 LAB — CBC
HCT: 44.6 % (ref 39.0–52.0)
Hemoglobin: 13.7 g/dL (ref 13.0–17.0)
MCH: 31 pg (ref 26.0–34.0)
MCHC: 30.7 g/dL (ref 30.0–36.0)
MCV: 100.9 fL — ABNORMAL HIGH (ref 80.0–100.0)
Platelets: 242 10*3/uL (ref 150–400)
RBC: 4.42 MIL/uL (ref 4.22–5.81)
RDW: 12.1 % (ref 11.5–15.5)
WBC: 6.5 10*3/uL (ref 4.0–10.5)
nRBC: 0 % (ref 0.0–0.2)

## 2019-05-27 LAB — I-STAT CHEM 8, ED
BUN: 4 mg/dL — ABNORMAL LOW (ref 8–23)
Calcium, Ion: 1.14 mmol/L — ABNORMAL LOW (ref 1.15–1.40)
Chloride: 103 mmol/L (ref 98–111)
Creatinine, Ser: 0.7 mg/dL (ref 0.61–1.24)
Glucose, Bld: 108 mg/dL — ABNORMAL HIGH (ref 70–99)
HCT: 39 % (ref 39.0–52.0)
Hemoglobin: 13.3 g/dL (ref 13.0–17.0)
Potassium: 4.8 mmol/L (ref 3.5–5.1)
Sodium: 139 mmol/L (ref 135–145)
TCO2: 27 mmol/L (ref 22–32)

## 2019-05-27 LAB — DIFFERENTIAL
Abs Immature Granulocytes: 0.02 10*3/uL (ref 0.00–0.07)
Basophils Absolute: 0.1 10*3/uL (ref 0.0–0.1)
Basophils Relative: 1 %
Eosinophils Absolute: 0.1 10*3/uL (ref 0.0–0.5)
Eosinophils Relative: 1 %
Immature Granulocytes: 0 %
Lymphocytes Relative: 40 %
Lymphs Abs: 2.6 10*3/uL (ref 0.7–4.0)
Monocytes Absolute: 0.7 10*3/uL (ref 0.1–1.0)
Monocytes Relative: 10 %
Neutro Abs: 3.1 10*3/uL (ref 1.7–7.7)
Neutrophils Relative %: 48 %

## 2019-05-27 LAB — ETHANOL: Alcohol, Ethyl (B): <10 mg/dL (ref <10)

## 2019-05-27 LAB — RAPID URINE DRUG SCREEN, HOSP PERFORMED
Amphetamines: NOT DETECTED
Barbiturates: NOT DETECTED
Benzodiazepines: NOT DETECTED
Cocaine: NOT DETECTED
Opiates: NOT DETECTED
Tetrahydrocannabinol: NOT DETECTED

## 2019-05-27 LAB — APTT: aPTT: 32 seconds (ref 24–36)

## 2019-05-27 LAB — PROTIME-INR
INR: 1 (ref 0.8–1.2)
Prothrombin Time: 13 seconds (ref 11.4–15.2)

## 2019-05-27 LAB — CBG MONITORING, ED: Glucose-Capillary: 69 mg/dL — ABNORMAL LOW (ref 70–99)

## 2019-05-27 MED ORDER — SODIUM CHLORIDE 0.9 % IV SOLN
100.0000 mg | Freq: Once | INTRAVENOUS | Status: AC
  Administered 2019-05-27: 100 mg via INTRAVENOUS
  Filled 2019-05-27: qty 10

## 2019-05-27 MED ORDER — IOHEXOL 350 MG/ML SOLN
75.0000 mL | Freq: Once | INTRAVENOUS | Status: AC | PRN
  Administered 2019-05-27: 75 mL via INTRAVENOUS

## 2019-05-27 MED ORDER — VIMPAT 100 MG PO TABS: 200.0000 mg | ORAL_TABLET | Freq: Two times a day (BID) | ORAL | 0 refills | Status: DC

## 2019-05-27 NOTE — ED Notes (Signed)
VM left with sister Steward Drone - POA regarding change in medication.

## 2019-05-27 NOTE — Code Documentation (Addendum)
64 yo male coming from home where he was weed eating today. Pt was LKW at 1400. His family found him in the yard later staring off into the distance unable to speak or follow commands. [facility] called and activated a Code Stroke.   Stroke Team met patient upon arrival. Labs drawn and EDP cleared. Taken to CT. Initial NIHSS 5 due to inability to follow commands, answer questions, and severe aphasia that is improving throughout exam.   Ct Head negative for hemorrhage and CTA complete that was negative for LVO per MD. Patient not a tPA candidate due to hx of ICH. Taken back to ED. Placed on cardiac monitor. Plan: cancel code stroke due to stroke not suspected.   Handoff given the Annie Main, Therapist, sports.

## 2019-05-27 NOTE — ED Notes (Signed)
Code stroke cancelled 

## 2019-05-27 NOTE — ED Notes (Signed)
Sister, Melina Modena, Steward Drone would like an update [phone]

## 2019-05-27 NOTE — Consult Note (Signed)
Neurology Consultation Reason for Consult: Aphasia Referring Physician: Jeraldine Loots, R  CC: Aphasia  History is obtained from: Patient  HPI: [name] Cruz is a 64 y.o. male with a history of previous intracranial hemorrhage as well as seizures who presents with aphasia started abruptly earlier today.  He has had multiple similar episodes, at least 1 of which was demonstrated to be due to epileptiform activity in left temporal lobe.  He has had incidental strokes found with his previous admissions, though not in areas I would be expected to cause the aphasia.  Today, he was mowing the lawn when he became abruptly confused.  His sister describes that he walked inside, no seizure-like activity, but he did have behavioral arrest.  On EMS arrival, he was standing, staring, would not follow commands.  They were able to reposition him easily with his assistance, but he was not speaking at all.  During transport, he began speaking some, following commands and continues to gradually improve.  Per previous notes, at baseline he does have a moderate aphasia.   LKW: 2pm tpa given?: no, previous ICH   ROS: A 14 point ROS was performed and is negative except as noted in the HPI.   Past Medical History:  Diagnosis Date  . Acute deep vein thrombosis (DVT) of left upper extremity (HCC)    L brachial and basilic veins, eliquis started 08/22/2017 to continue for 3 months total   . Cognitive impairment 2007   after stroke, saw rehab but told to stop because was too upsetting to him  . History of chicken pox   . HLD (hyperlipidemia)   . HTN (hypertension)   . NSTEMI (non-ST elevated myocardial infarction) (HCC) 02/21/2017  . Obesity   . Stroke, hemorrhagic (HCC) 2007   thought 2/2 HTN (240sbp); residual cognitive impairment, loss of R peripheral field, no driving     Family History  Problem Relation Age of Onset  . Alzheimer's disease Maternal Grandfather   . Cancer Mother        lymphoma  . Alcohol  abuse Father        smoker  . Coronary artery disease Neg Hx   . Stroke Neg Hx   . Diabetes Neg Hx      Social History:  reports that he has never smoked. He has quit using smokeless tobacco. He reports previous alcohol use. He reports that he does not use drugs.   Exam: Current vital signs: BP (!) 153/74   Resp 16  Vital signs in last 24 hours: Resp:  [16] 16 (05/13 1615) BP: (153)/(74) 153/74 (05/13 1615)   Physical Exam  Constitutional: Appears well-developed and well-nourished.  Psych: Affect appropriate to situation Eyes: No scleral injection HENT: No OP obstrucion MSK: no joint deformities.  Cardiovascular: Normal rate and regular rhythm.  Respiratory: Effort normal, non-labored breathing GI: Soft.  No distension. There is no tenderness.  Skin: WDI  Neuro: Mental Status: Patient is awake, alert, he has some difficulty with following commands, but is able to relay a significant portion of the history after he has been here for period time. Cranial Nerves: II: Blinks to threat bilaterally pupils are equal, round, and reactive to light.   III,IV, VI: EOMI without ptosis or diploplia.  V: Facial sensation is symmetric to temperature VII: Facial movement with?  Left decreased nasolabial fold VIII: hearing is intact to voice X: Uvula elevates symmetrically XI: Shoulder shrug is symmetric. XII: tongue is midline without atrophy or fasciculations.  Motor: No  clear weakness, he is able to hold all extremities without drift. Sensory: He responds to stimulation in all 4 extremities Cerebellar: No clear ataxia on finger-nose-finger.  I have reviewed labs in epic and the results pertinent to this consultation are: Glucose 69  I have reviewed the images  Chem-8-unremarkable  Impression: 64 year old male with abrupt behavioral arrest, aphasic episode to which the patient is amnestic most consistent with complex partial seizure.  Recommendations: 1) MRI brain, if  negative, no further workup.  2) increase vimpat to 200mg  BID 3) continue keppra 1500mg  BID 4) UA   Roland Rack, MD [facility] [phone]  If 7pm- 7am, please page neurology on call as listed in [place].

## 2019-05-27 NOTE — ED Notes (Signed)
Sister - Steward Drone - called back and I spoke with her via telephone. Informed her that other sister would be taking patient home - Upset about this because she stated she needed to know since she was his POA. Told her that is why I was calling.... Also upset that no physician had called with update. Spoke with sister regarding medication dosage change and diagnosis.

## 2019-05-27 NOTE — ED Provider Notes (Signed)
[facility] EMERGENCY DEPARTMENT Provider Note   CSN: [id number] Arrival date & time: 05/27/19  1603  An emergency department physician performed an initial assessment on this suspected stroke patient at 89.  History Chief Complaint  Patient presents with  . Code Stroke    [name] Cruz is a 64 y.o. male.  HPI    Patient presents as a code stroke. Reported the patient at baseline is awake, alert, ambulatory, with normal speech. He was found to be essentially noninteractive, with an absence seizure-like presentation, and EMS was called. EMS providers provide the history, as the patient is unable to do so secondary to his aphasia. They note that the patient was initially essentially noninteractive, with gaze, but no following of commands, no reliable interactivity. In route to the patient improved somewhat. No reported hemodynamic instability in route.  Patient's glucose in route was greater than 100. Level 5 caveat secondary to acuity of condition. Past Medical History:  Diagnosis Date  . Acute deep vein thrombosis (DVT) of left upper extremity (HCC)    L brachial and basilic veins, eliquis started 08/22/2017 to continue for 3 months total   . Cognitive impairment 2007   after stroke, saw rehab but told to stop because was too upsetting to him  . History of chicken pox   . HLD (hyperlipidemia)   . HTN (hypertension)   . NSTEMI (non-ST elevated myocardial infarction) (HCC) 02/21/2017  . Obesity   . Stroke, hemorrhagic (HCC) 2007   thought 2/2 HTN (240sbp); residual cognitive impairment, loss of R peripheral field, no driving    Patient Active Problem List   Diagnosis Date Noted  . History of DVT in adulthood 02/21/2019  . PAD (peripheral artery disease) (HCC) 12/14/2018  . Agitation 09/04/2018  . CVA (cerebral vascular accident) (HCC) 08/08/2018  . Acute CVA (cerebrovascular accident) (HCC) 08/07/2018  . Cerebellar stroke, acute (HCC) 07/10/2018  .  Status epilepticus (HCC) 03/25/2018  . Unilateral occipital headache 10/03/2017  . Ischemic stroke (HCC) 08/23/2017  . Coronary artery disease involving coronary bypass graft of native heart without angina pectoris   . Atrial fibrillation (HCC)   . Dysphagia, post-stroke   . Cardiac arrest (HCC) 08/09/2017  . S/P CABG x 3 02/24/2017  . NSTEMI (non-ST elevated myocardial infarction) (HCC) 02/21/2017  . Encephalomalacia 10/28/2012  . Seizure disorder (HCC) 04/26/2012  . Alcohol abuse 04/26/2012  . History of stroke with current residual effects   . Cognitive impairment   . HLD (hyperlipidemia)   . HTN (hypertension)     Past Surgical History:  Procedure Laterality Date  . ANKLE SURGERY  1990s   right foot with plate and screws  . CORONARY ARTERY BYPASS GRAFT N/A 02/24/2017   3v Procedure: CORONARY ARTERY BYPASS GRAFTING (CABG) x 3 ON PUMP USING LEFT INTERNAL MAMMARY ARTERY TO LEFT ANTERIOR DESENDING CORNARY ARTERY, RIGHT GREATER SAPHENOUS VEIN TO LEFT CIRCUMFLEX ARTERY AND POSTERIOR DESENDING ARTERY. RIGHT GREATER SAPHENOUS VEIN OBTAINED VIA ENDOVEIN HARVEST.;  Surgeon: Delight Ovens, MD  . IABP INSERTION N/A 02/21/2017   Procedure: IABP Insertion;  Surgeon: Yvonne Kendall, MD;  Location: [facility] INVASIVE CV LAB;  Service: Cardiovascular;  Laterality: N/A;  . LEFT HEART CATH AND CORONARY ANGIOGRAPHY N/A 02/21/2017   Procedure: LEFT HEART CATH AND CORONARY ANGIOGRAPHY;  Surgeon: Yvonne Kendall, MD;  Location: [facility] INVASIVE CV LAB;  Service: Cardiovascular;  Laterality: N/A;  . TEE WITHOUT CARDIOVERSION N/A 02/24/2017   Procedure: TRANSESOPHAGEAL ECHOCARDIOGRAM (TEE);  Surgeon: Delight Ovens, MD;  Location: [facility] OR;  Service: Open Heart Surgery;  Laterality: N/A;       Family History  Problem Relation Age of Onset  . Alzheimer's disease Maternal Grandfather   . Cancer Mother        lymphoma  . Alcohol abuse Father        smoker  . Coronary artery disease Neg Hx   . Stroke Neg Hx    . Diabetes Neg Hx     Social History   Tobacco Use  . Smoking status: Never Smoker  . Smokeless tobacco: Former Engineer, water Use Topics  . Alcohol use: Not Currently  . Drug use: No    Home Medications Prior to Admission medications   Medication Sig Start Date End Date Taking? Authorizing Provider  aspirin EC 81 MG tablet Take 1 tablet (81 mg total) by mouth daily. 12/17/18   [name], MD  atorvastatin (LIPITOR) 80 MG tablet Take 1 tablet (80 mg total) by mouth daily. 03/01/19   [name], MD  carvedilol (COREG) 3.125 MG tablet Take 1 tablet (3.125 mg total) by mouth 2 (two) times daily. 03/01/19   [name], MD  Cyanocobalamin (B-12) 1000 MCG SUBL Place 1 tablet under the tongue daily. Patient taking differently: Place 1,000 mcg under the tongue daily.  12/26/17   [name], MD  Lacosamide (VIMPAT) 100 MG TABS Take 1 tablet in the morning and 2 at night (total 300mg  daily) 05/03/19   05/05/19, NP  levETIRAcetam (KEPPRA) 750 MG tablet Take 2 tablets (1,500 mg total) by mouth 2 (two) times daily. 10/22/18   12/22/18, NP  Multiple Vitamin (MULTIVITAMIN WITH MINERALS) TABS tablet Take 1 tablet by mouth daily. 12/26/17   12/28/17, MD  sertraline (ZOLOFT) 25 MG tablet TAKE 1 TABLET BY MOUTH EVERY DAY 04/26/19   06/26/19, MD  ziprasidone (GEODON) 40 MG capsule TAKE 1 CAPSULE (40 MG TOTAL) BY MOUTH AT BEDTIME. 03/01/19   03/03/19, MD    Allergies    Losartan  Review of Systems   Review of Systems  Unable to perform ROS: Acuity of condition    Physical Exam Updated Vital Signs BP (!) 153/81   Pulse 65   Temp 98.6 F (37 C) (Temporal)   Resp 13   SpO2 99%   Physical Exam HENT:     Head: Normocephalic.  Eyes:     General:        Right eye: No discharge.        Left eye: No discharge.  Cardiovascular:     Rate and Rhythm: Normal rate and regular rhythm.  Pulmonary:     Effort: No respiratory distress.    Abdominal:     General: There is no distension.  Musculoskeletal:        General: No deformity.  Skin:    Coloration: Skin is not jaundiced.     Findings: No bruising.  Neurological:     Mental Status: He is alert.     Comments: Patient is inattentive, and though he visibly seems intent on following commands, he has inconsistent movement motion, speech, response to all of them.  He does move extremities, spontaneously, but not reliably to command.  Psychiatric:        Cognition and Memory: Cognition is impaired.      ED Results / Procedures / Treatments   Labs (all labs ordered are listed, but only abnormal results are displayed) Labs Reviewed  CBC - Abnormal; Notable for  the following components:      Result Value   MCV 100.9 (*)    All other components within normal limits  COMPREHENSIVE METABOLIC PANEL - Abnormal; Notable for the following components:   Glucose, Bld 106 (*)    BUN 5 (*)    All other components within normal limits  URINALYSIS, ROUTINE W REFLEX MICROSCOPIC - Abnormal; Notable for the following components:   Color, Urine STRAW (*)    All other components within normal limits  I-STAT CHEM 8, ED - Abnormal; Notable for the following components:   BUN 4 (*)    Glucose, Bld 108 (*)    Calcium, Ion 1.14 (*)    All other components within normal limits  CBG MONITORING, ED - Abnormal; Notable for the following components:   Glucose-Capillary 69 (*)    All other components within normal limits  ETHANOL  PROTIME-INR  APTT  DIFFERENTIAL  RAPID URINE DRUG SCREEN, HOSP PERFORMED    EKG None  Radiology CT Code Stroke CTA Head W/WO contrast  Result Date: 05/27/2019 CLINICAL DATA:  Focal neurological deficit. Altered mental status. Negative acute head CT. EXAM: CT ANGIOGRAPHY HEAD AND NECK TECHNIQUE: Multidetector CT imaging of the head and neck was performed using the standard protocol during bolus administration of intravenous contrast. Multiplanar CT image  reconstructions and MIPs were obtained to evaluate the vascular anatomy. Carotid stenosis measurements (when applicable) are obtained utilizing NASCET criteria, using the distal internal carotid diameter as the denominator. CONTRAST:  75mL OMNIPAQUE IOHEXOL 350 MG/ML SOLN COMPARISON:  Head CT earlier same day.  MRI 11/11/2018. FINDINGS: CTA NECK FINDINGS Aortic arch: Aortic atherosclerosis. Previous CABG. Branching pattern shows common origin of the innominate artery and left common carotid artery and origin of the left vertebral artery directly from the arch. Right carotid system: Common carotid artery widely patent to the bifurcation. Calcified plaque at the carotid bifurcation and ICA bulb. Minimal diameter in the ICA bulb is 1.5 mm. Compared to a more distal cervical ICA diameter of 4 mm, this indicates a 60-65% stenosis. Left carotid system: Common carotid artery is widely patent to the bifurcation. Calcified plaque at the carotid bifurcation and ICA bulb. Minimal diameter at the ICA origin is 3.5 mm. Compared to a more distal cervical ICA diameter of 3.8 mm, there is only a minimal, insignificant stenosis. Vertebral arteries: Calcified plaque at the right vertebral artery origin. Stenosis estimated at 50-70%. Beyond that, the vessel is widely patent through the cervical region to the foramen magnum. Left vertebral artery arises directly from the arch. Calcified plaque at the vertebral artery origin with stenosis of 50-70%. Beyond that, the vessel is widely patent through the cervical region to the foramen magnum. Skeleton: Ordinary mid cervical spondylosis. Other neck: No mass or lymphadenopathy. Upper chest: Negative Review of the MIP images confirms the above findings CTA HEAD FINDINGS Anterior circulation: Both internal carotid arteries are patent through the skull base and siphon regions. There is siphon atherosclerotic calcification without stenosis greater than 50%. The anterior and middle cerebral  vessels are patent without proximal stenosis, aneurysm or vascular malformation. There is probably a chronically missing left M2 vessel related to the old parietal infarction. No acute large or medium vessel occlusion is identified. Right PCA takes fetal origin from the anterior circulation. Posterior circulation: Both vertebral arteries are patent through the foramen magnum to the basilar. Small focus of calcification in the left vertebral artery V4 segments but without stenosis greater than 30%. No basilar stenosis. Posterior circulation  branch vessels are patent. Venous sinuses: Patent and normal. Anatomic variants: None other significant. Review of the MIP images confirms the above findings IMPRESSION: Atherosclerotic disease at both carotid bifurcations. 60-65% stenosis in the right ICA bulb. Insignificant, less than 20% stenosis i at the left ICA origin. 50-70% stenoses at the right vertebral artery origin from the subclavian artery and at the left vertebral artery origin from the arch. No acute intracranial large or medium vessel occlusion. Likely chronic missing M2 branch on the left related to the old left parietal infarction. Electronically Signed   By: Nelson Chimes M.D.   On: 05/27/2019 16:36   CT Code Stroke CTA Neck W/WO contrast  Result Date: 05/27/2019 CLINICAL DATA:  Focal neurological deficit. Altered mental status. Negative acute head CT. EXAM: CT ANGIOGRAPHY HEAD AND NECK TECHNIQUE: Multidetector CT imaging of the head and neck was performed using the standard protocol during bolus administration of intravenous contrast. Multiplanar CT image reconstructions and MIPs were obtained to evaluate the vascular anatomy. Carotid stenosis measurements (when applicable) are obtained utilizing NASCET criteria, using the distal internal carotid diameter as the denominator. CONTRAST:  91mL OMNIPAQUE IOHEXOL 350 MG/ML SOLN COMPARISON:  Head CT earlier same day.  MRI 11/11/2018. FINDINGS: CTA NECK FINDINGS  Aortic arch: Aortic atherosclerosis. Previous CABG. Branching pattern shows common origin of the innominate artery and left common carotid artery and origin of the left vertebral artery directly from the arch. Right carotid system: Common carotid artery widely patent to the bifurcation. Calcified plaque at the carotid bifurcation and ICA bulb. Minimal diameter in the ICA bulb is 1.5 mm. Compared to a more distal cervical ICA diameter of 4 mm, this indicates a 60-65% stenosis. Left carotid system: Common carotid artery is widely patent to the bifurcation. Calcified plaque at the carotid bifurcation and ICA bulb. Minimal diameter at the ICA origin is 3.5 mm. Compared to a more distal cervical ICA diameter of 3.8 mm, there is only a minimal, insignificant stenosis. Vertebral arteries: Calcified plaque at the right vertebral artery origin. Stenosis estimated at 50-70%. Beyond that, the vessel is widely patent through the cervical region to the foramen magnum. Left vertebral artery arises directly from the arch. Calcified plaque at the vertebral artery origin with stenosis of 50-70%. Beyond that, the vessel is widely patent through the cervical region to the foramen magnum. Skeleton: Ordinary mid cervical spondylosis. Other neck: No mass or lymphadenopathy. Upper chest: Negative Review of the MIP images confirms the above findings CTA HEAD FINDINGS Anterior circulation: Both internal carotid arteries are patent through the skull base and siphon regions. There is siphon atherosclerotic calcification without stenosis greater than 50%. The anterior and middle cerebral vessels are patent without proximal stenosis, aneurysm or vascular malformation. There is probably a chronically missing left M2 vessel related to the old parietal infarction. No acute large or medium vessel occlusion is identified. Right PCA takes fetal origin from the anterior circulation. Posterior circulation: Both vertebral arteries are patent through the  foramen magnum to the basilar. Small focus of calcification in the left vertebral artery V4 segments but without stenosis greater than 30%. No basilar stenosis. Posterior circulation branch vessels are patent. Venous sinuses: Patent and normal. Anatomic variants: None other significant. Review of the MIP images confirms the above findings IMPRESSION: Atherosclerotic disease at both carotid bifurcations. 60-65% stenosis in the right ICA bulb. Insignificant, less than 20% stenosis i at the left ICA origin. 50-70% stenoses at the right vertebral artery origin from the subclavian artery and at  the left vertebral artery origin from the arch. No acute intracranial large or medium vessel occlusion. Likely chronic missing M2 branch on the left related to the old left parietal infarction. Electronically Signed   By: Paulina Fusi M.D.   On: 05/27/2019 16:36   MR BRAIN WO CONTRAST  Result Date: 05/27/2019 CLINICAL DATA:  Initial evaluation for acute speech difficulty. EXAM: MRI HEAD WITHOUT CONTRAST TECHNIQUE: Multiplanar, multiecho pulse sequences of the brain and surrounding structures were obtained without intravenous contrast. COMPARISON:  Prior CT from earlier same day as well as previous brain MRI from 11/11/2018. FINDINGS: Brain: Generalized age-related cerebral atrophy. Patchy and confluent T2/FLAIR hyperintensity within the periventricular deep white matter both cerebral hemispheres most consistent with chronic small vessel ischemic disease. Few scattered superimposed remote lacunar infarcts present at the right basal ganglia and thalamus. Chronic posterior left MCA territory infarct involving the left parietal region with associated chronic hemosiderin staining noted, stable. No abnormal foci of restricted diffusion to suggest acute or subacute ischemia. Gray-white matter differentiation maintained. No other areas of remote cortical infarction. No evidence for acute intracranial hemorrhage. Few scattered chronic  micro hemorrhages seen involving both cerebral hemispheres, nonspecific. No mass lesion, midline shift or mass effect. Diffuse ventricular prominence with ex vacuo dilatation of the left lateral ventricle, stable from previous. No hydrocephalus. No extra-axial fluid collection. Pituitary gland suprasellar region normal. Midline structures intact. Vascular: Major intracranial vascular flow voids are maintained. Skull and upper cervical spine: Craniocervical junction within normal limits. Bone marrow signal intensity normal. No scalp soft tissue abnormality. Sinuses/Orbits: Globes and orbital soft tissues within normal limits. Mild scattered mucosal thickening noted within the ethmoidal air cells. Paranasal sinuses are otherwise clear. Small left greater than right mastoid effusions noted. Visualized nasopharynx within normal limits. Other: None. IMPRESSION: 1. No acute intracranial abnormality. 2. Generalized age-related cerebral atrophy with moderate chronic small vessel ischemic disease, with superimposed chronic hemorrhagic left parietal lobe infarct, stable. 3. Scattered chronic micro hemorrhages involving the cerebral hemispheres bilaterally. Findings are nonspecific, but favored to be related to poorly controlled hypertension. Electronically Signed   By: Rise Mu M.D.   On: 05/27/2019 18:34   CT HEAD CODE STROKE WO CONTRAST  Result Date: 05/27/2019 CLINICAL DATA:  Code stroke.  Altered mental status EXAM: CT HEAD WITHOUT CONTRAST TECHNIQUE: Contiguous axial images were obtained from the base of the skull through the vertex without intravenous contrast. COMPARISON:  CT head 08/19/2018, MRI 11/11/2018 FINDINGS: Brain: Moderate atrophy. Chronic volume loss in the left temporoparietal lobe due to chronic infarction. Associated dystrophic calcification. These findings are stable. In addition, there is moderate chronic microvascular ischemic change in the white matter. Negative for acute infarct,  hemorrhage, mass Vascular: Negative for hyperdense vessel Skull: Negative Sinuses/Orbits: Mild mucosal edema paranasal sinuses. Negative orbit Other: None ASPECTS (Alberta Stroke Program Early CT Score) - Ganglionic level infarction (caudate, lentiform nuclei, internal capsule, insula, M1-M3 cortex): 7 - Supraganglionic infarction (M4-M6 cortex): 3 Total score (0-10 with 10 being normal): 10 IMPRESSION: 1. No acute abnormality no change from prior studies 2. Atrophy and chronic ischemic changes as above. 3. ASPECTS is 10 4. Preliminary results texted to Dr. Amada Jupiter via Loretha Stapler Electronically Signed   By: Marlan Palau M.D.   On: 05/27/2019 16:18    Procedures Procedures (including critical care time)  Medications Ordered in ED Medications  iohexol (OMNIPAQUE) 350 MG/ML injection 75 mL (75 mLs Intravenous Contrast Given 05/27/19 1618)  lacosamide (VIMPAT) 100 mg in sodium chloride 0.9 % 25  mL IVPB (100 mg Intravenous New Bag/Given 05/27/19 1852)    ED Course  I have reviewed the triage vital signs and the nursing notes.  Pertinent labs & imaging results that were available during my care of the patient were reviewed by me and considered in my medical decision making (see chart for details).  After initial evaluation for airway patency, patient had blood work performed, had emergent head CT. This did not demonstrate hemorrhage.  Patient had some clinical improvement.   Return from CT patient continues to have slight improvement. Now, in reviewing the patient's chart, discussing with neurology, patient does have a history of seizures, and questionable medication compliance. It seems as though patient acknowledges and amnestic.  Prior to now as well. With consideration of seizure versus stroke, though initial CT was reassuring, patient is awaiting MRI. Patient will receive Vimpat, loading as well.  8:01 PM Patient awake, alert he has tolerated initial antiepileptics. I reviewed the  patient's MRI, and in comparison to most recent study, there is a continued demonstration of chronic microhemorrhage, likely secondary to the patient's ongoing difficulty with blood pressure control. However, there is no evidence for acute stroke, and given his improvement, back to baseline, there is suspicion, as above for seizure contributing to today's episode. Per our neurology colleagues, with reassuring MRI, return to baseline, unremarkable vital signs, after initiation of antiepileptics, the patient is appropriate for discharge to follow-up with his neurologist and primary care physician.   MDM Number of Diagnoses or Management Options   Amount and/or Complexity of Data Reviewed Clinical lab tests: reviewed Tests in the radiology section of CPT: reviewed Tests in the medicine section of CPT: reviewed Decide to obtain previous medical records or to obtain history from someone other than the patient: yes Obtain history from someone other than the patient: yes Review and summarize past medical records: yes Discuss the patient with other providers: yes Independent visualization of images, tracings, or specimens: yes  Risk of Complications, Morbidity, and/or Mortality Presenting problems: high Diagnostic procedures: high Management options: high    Final Clinical Impression(s) / ED Diagnoses Final diagnoses:  Stupor    Rx / DC Orders ED Discharge Orders         Ordered    Lacosamide (VIMPAT) 100 MG TABS  2 times daily     05/27/19 2005           Gerhard Munch, MD 05/27/19 2006

## 2019-05-27 NOTE — Discharge Instructions (Signed)
As discussed, today evidence that today's episode was caused by a seizure.  Is very important that you take your medication as prescribed, including the changes we made starting today.  Equally important that you follow-up with your physician.  Please call tomorrow for the next available appointment.  Return here for concerning changes in your condition.

## 2019-05-27 NOTE — ED Triage Notes (Signed)
Pt bib [facility] from home as code stroke. Pt initially alert but unable to follow commands, or respond to questions w/ EMS. Pt improved on arrival to ED, initial NIHSS: 5  EMS VS: 171/116 HR 70 CBG 157 RR 18 SPO2 98% on RA

## 2019-06-08 ENCOUNTER — Ambulatory Visit: Payer: Medicare Other | Admitting: Adult Health

## 2019-06-10 ENCOUNTER — Encounter: Payer: Self-pay | Admitting: Family Medicine

## 2019-06-18 ENCOUNTER — Other Ambulatory Visit: Payer: Self-pay | Admitting: Family Medicine

## 2019-06-18 NOTE — Telephone Encounter (Signed)
E-scribed refills.  Plz schedule wellness, cpe and lab visits.  

## 2019-06-25 ENCOUNTER — Inpatient Hospital Stay (HOSPITAL_COMMUNITY): Payer: Medicare Other

## 2019-06-25 ENCOUNTER — Inpatient Hospital Stay (HOSPITAL_COMMUNITY)
Admission: AD | Admit: 2019-06-25 | Discharge: 2019-06-27 | DRG: 101 | Disposition: A | Discharge status: Home / Self Care | Payer: Medicare Other | Attending: Internal Medicine | Admitting: Internal Medicine

## 2019-06-25 DIAGNOSIS — I69354 Hemiplegia and hemiparesis following cerebral infarction affecting left non-dominant side: Secondary | ICD-10-CM | POA: Diagnosis not present

## 2019-06-25 DIAGNOSIS — E785 Hyperlipidemia, unspecified: Secondary | ICD-10-CM | POA: Diagnosis present

## 2019-06-25 DIAGNOSIS — Z03818 Encounter for observation for suspected exposure to other biological agents ruled out: Secondary | ICD-10-CM | POA: Diagnosis not present

## 2019-06-25 DIAGNOSIS — Z86718 Personal history of other venous thrombosis and embolism: Secondary | ICD-10-CM

## 2019-06-25 DIAGNOSIS — R000 Tachycardia, unspecified: Secondary | ICD-10-CM | POA: Diagnosis not present

## 2019-06-25 DIAGNOSIS — G40901 Epilepsy, unspecified, not intractable, with status epilepticus: Secondary | ICD-10-CM | POA: Diagnosis present

## 2019-06-25 DIAGNOSIS — Z79899 Other long term (current) drug therapy: Secondary | ICD-10-CM

## 2019-06-25 DIAGNOSIS — I252 Old myocardial infarction: Secondary | ICD-10-CM

## 2019-06-25 DIAGNOSIS — Z951 Presence of aortocoronary bypass graft: Secondary | ICD-10-CM | POA: Diagnosis not present

## 2019-06-25 DIAGNOSIS — Z6829 Body mass index (BMI) 29.0-29.9, adult: Secondary | ICD-10-CM

## 2019-06-25 DIAGNOSIS — Z20822 Contact with and (suspected) exposure to covid-19: Secondary | ICD-10-CM | POA: Diagnosis not present

## 2019-06-25 DIAGNOSIS — Z743 Need for continuous supervision: Secondary | ICD-10-CM | POA: Diagnosis not present

## 2019-06-25 DIAGNOSIS — R569 Unspecified convulsions: Secondary | ICD-10-CM | POA: Diagnosis not present

## 2019-06-25 DIAGNOSIS — Z7982 Long term (current) use of aspirin: Secondary | ICD-10-CM

## 2019-06-25 DIAGNOSIS — Z87891 Personal history of nicotine dependence: Secondary | ICD-10-CM

## 2019-06-25 DIAGNOSIS — I10 Essential (primary) hypertension: Secondary | ICD-10-CM | POA: Diagnosis not present

## 2019-06-25 DIAGNOSIS — E669 Obesity, unspecified: Secondary | ICD-10-CM | POA: Diagnosis present

## 2019-06-25 DIAGNOSIS — R404 Transient alteration of awareness: Secondary | ICD-10-CM | POA: Diagnosis not present

## 2019-06-25 DIAGNOSIS — R4189 Other symptoms and signs involving cognitive functions and awareness: Secondary | ICD-10-CM | POA: Diagnosis present

## 2019-06-25 DIAGNOSIS — G40409 Other generalized epilepsy and epileptic syndromes, not intractable, without status epilepticus: Secondary | ICD-10-CM | POA: Diagnosis not present

## 2019-06-25 DIAGNOSIS — I2510 Atherosclerotic heart disease of native coronary artery without angina pectoris: Secondary | ICD-10-CM | POA: Diagnosis not present

## 2019-06-25 DIAGNOSIS — Z888 Allergy status to other drugs, medicaments and biological substances status: Secondary | ICD-10-CM | POA: Diagnosis not present

## 2019-06-25 DIAGNOSIS — F39 Unspecified mood [affective] disorder: Secondary | ICD-10-CM | POA: Diagnosis present

## 2019-06-25 DIAGNOSIS — R0902 Hypoxemia: Secondary | ICD-10-CM | POA: Diagnosis not present

## 2019-06-25 DIAGNOSIS — G40909 Epilepsy, unspecified, not intractable, without status epilepticus: Secondary | ICD-10-CM

## 2019-06-25 DIAGNOSIS — R41841 Cognitive communication deficit: Secondary | ICD-10-CM | POA: Diagnosis not present

## 2019-06-25 DIAGNOSIS — I6930 Unspecified sequelae of cerebral infarction: Secondary | ICD-10-CM

## 2019-06-25 LAB — BASIC METABOLIC PANEL
Anion gap: 14 (ref 5–15)
BUN: 5 mg/dL — ABNORMAL LOW (ref 8–23)
CO2: 19 mmol/L — ABNORMAL LOW (ref 22–32)
Calcium: 8.8 mg/dL — ABNORMAL LOW (ref 8.9–10.3)
Chloride: 104 mmol/L (ref 98–111)
Creatinine, Ser: 1.11 mg/dL (ref 0.61–1.24)
GFR calc Af Amer: >60 mL/min (ref >60)
GFR calc non Af Amer: >60 mL/min (ref >60)
Glucose, Bld: 175 mg/dL — ABNORMAL HIGH (ref 70–99)
Potassium: 3.6 mmol/L (ref 3.5–5.1)
Sodium: 137 mmol/L (ref 135–145)

## 2019-06-25 LAB — CBC WITH DIFFERENTIAL/PLATELET
Abs Immature Granulocytes: 0.08 10*3/uL — ABNORMAL HIGH (ref 0.00–0.07)
Basophils Absolute: 0 10*3/uL (ref 0.0–0.1)
Basophils Relative: 1 %
Eosinophils Absolute: 0.1 10*3/uL (ref 0.0–0.5)
Eosinophils Relative: 2 %
HCT: 41.8 % (ref 39.0–52.0)
Hemoglobin: 13.7 g/dL (ref 13.0–17.0)
Immature Granulocytes: 1 %
Lymphocytes Relative: 18 %
Lymphs Abs: 1.1 10*3/uL (ref 0.7–4.0)
MCH: 31.2 pg (ref 26.0–34.0)
MCHC: 32.8 g/dL (ref 30.0–36.0)
MCV: 95.2 fL (ref 80.0–100.0)
Monocytes Absolute: 0.4 10*3/uL (ref 0.1–1.0)
Monocytes Relative: 7 %
Neutro Abs: 4.4 10*3/uL (ref 1.7–7.7)
Neutrophils Relative %: 71 %
Platelets: 270 10*3/uL (ref 150–400)
RBC: 4.39 MIL/uL (ref 4.22–5.81)
RDW: 12.1 % (ref 11.5–15.5)
WBC: 6.2 10*3/uL (ref 4.0–10.5)
nRBC: 0 % (ref 0.0–0.2)

## 2019-06-25 LAB — CBG MONITORING, ED: Glucose-Capillary: 163 mg/dL — ABNORMAL HIGH (ref 70–99)

## 2019-06-25 LAB — URINALYSIS, ROUTINE W REFLEX MICROSCOPIC
Bilirubin Urine: NEGATIVE
Glucose, UA: NEGATIVE mg/dL
Hgb urine dipstick: NEGATIVE
Ketones, ur: NEGATIVE mg/dL
Leukocytes,Ua: NEGATIVE
Nitrite: NEGATIVE
Protein, ur: NEGATIVE mg/dL
Specific Gravity, Urine: 1.003 — ABNORMAL LOW (ref 1.005–1.030)
pH: 6 (ref 5.0–8.0)

## 2019-06-25 LAB — RAPID URINE DRUG SCREEN, HOSP PERFORMED
Amphetamines: NOT DETECTED
Barbiturates: NOT DETECTED
Benzodiazepines: POSITIVE — AB
Cocaine: NOT DETECTED
Opiates: NOT DETECTED
Tetrahydrocannabinol: NOT DETECTED

## 2019-06-25 LAB — SARS CORONAVIRUS 2 BY RT PCR (HOSPITAL ORDER, PERFORMED IN ~~LOC~~ HOSPITAL LAB): SARS Coronavirus 2: NEGATIVE

## 2019-06-25 LAB — GLUCOSE, CAPILLARY: Glucose-Capillary: 106 mg/dL — ABNORMAL HIGH (ref 70–99)

## 2019-06-25 LAB — ETHANOL: Alcohol, Ethyl (B): <10 mg/dL (ref <10)

## 2019-06-25 MED ORDER — LORAZEPAM 2 MG/ML IJ SOLN
INTRAMUSCULAR | Status: AC
  Administered 2019-06-25: 2 mg
  Filled 2019-06-25: qty 1

## 2019-06-25 MED ORDER — LEVETIRACETAM IN NACL 1000 MG/100ML IV SOLN
1000.0000 mg | Freq: Once | INTRAVENOUS | Status: AC
  Administered 2019-06-25: 1000 mg via INTRAVENOUS
  Filled 2019-06-25: qty 100

## 2019-06-25 MED ORDER — ENOXAPARIN SODIUM 40 MG/0.4ML ~~LOC~~ SOLN
40.0000 mg | SUBCUTANEOUS | Status: DC
  Administered 2019-06-26 – 2019-06-27 (×2): 40 mg via SUBCUTANEOUS
  Filled 2019-06-25 (×3): qty 0.4

## 2019-06-25 MED ORDER — VALPROATE SODIUM 500 MG/5ML IV SOLN
500.0000 mg | Freq: Two times a day (BID) | INTRAVENOUS | Status: DC
  Administered 2019-06-26 – 2019-06-27 (×3): 500 mg via INTRAVENOUS
  Filled 2019-06-25 (×5): qty 5

## 2019-06-25 MED ORDER — SODIUM CHLORIDE 0.9 % IV BOLUS
1000.0000 mL | Freq: Once | INTRAVENOUS | Status: AC
  Administered 2019-06-25: 1000 mL via INTRAVENOUS

## 2019-06-25 MED ORDER — LEVETIRACETAM IN NACL 1500 MG/100ML IV SOLN
1500.0000 mg | Freq: Two times a day (BID) | INTRAVENOUS | Status: DC
  Administered 2019-06-26 – 2019-06-27 (×3): 1500 mg via INTRAVENOUS
  Filled 2019-06-25 (×4): qty 100

## 2019-06-25 MED ORDER — SODIUM CHLORIDE 0.9 % IV SOLN: INTRAVENOUS | Status: DC

## 2019-06-25 MED ORDER — VALPROATE SODIUM 500 MG/5ML IV SOLN
1000.0000 mg | Freq: Once | INTRAVENOUS | Status: AC
  Administered 2019-06-25: 1000 mg via INTRAVENOUS
  Filled 2019-06-25: qty 10

## 2019-06-25 MED ORDER — SODIUM CHLORIDE 0.9 % IV SOLN
200.0000 mg | Freq: Two times a day (BID) | INTRAVENOUS | Status: DC
  Administered 2019-06-26 – 2019-06-27 (×3): 200 mg via INTRAVENOUS
  Filled 2019-06-25 (×4): qty 20

## 2019-06-25 MED ORDER — SODIUM CHLORIDE 0.9 % IV SOLN: 200.0000 mg | INTRAVENOUS | Status: DC

## 2019-06-25 MED ORDER — SODIUM CHLORIDE 0.9 % IV SOLN
200.0000 mg | Freq: Once | INTRAVENOUS | Status: AC
  Administered 2019-06-25: 200 mg via INTRAVENOUS
  Filled 2019-06-25: qty 20

## 2019-06-25 NOTE — Procedures (Signed)
Patient Name: [name] Cruz  MRN: [id number]  Epilepsy Attending: [name]  Referring Physician/Provider: Dr Ritta Slot Date: 06/25/2019  Duration: 26.06 mins  Patient history: 64 year old male with a history of recurrent seizures who presents with status epilepticus. EEG to evaluate for seizure.   Level of alertness: Awake  AEDs during EEG study: Ativan, Keppra, VPA, Vimpat  Technical aspects: This EEG study was done with scalp electrodes positioned according to the 10-20 International system of electrode placement. Electrical activity was acquired at a sampling rate of 500Hz  and reviewed with a high frequency filter of 70Hz  and a low frequency filter of 1Hz . EEG data were recorded continuously and digitally stored.   Description: No clear posterior dominant rhythm was seen. EEG showed continuous generalized and lateralized right hemisphere 3-6Hz  theta-delta slowing with overriding 15-18Hz  generalized beta activity. Rare sharp wave were also seen in left anterior temporal region. Hyperventilation and photic stimulation were not performed.     ABNORMALITY - Sharp wave, left anterior temporal region - Continuous slow, generalized and lateralized right hemisphere  - Excessive beta, generalized  IMPRESSION: This study is suggestive of epileptogenicity in left anterior temporal region. There is also cortical dysfunction in right hemisphere likely secondary to underlying structural abnormality, post ictal state. Additionally, there is evidence of moderate diffuse encephalopathy, nonspecific etiology. The excessive beta activity seen in the background is most likely due to the effect of benzodiazepine and is a benign EEG pattern. No seizures were seen throughout the recording.  Quientin Jent 

## 2019-06-25 NOTE — Progress Notes (Signed)
LTM EEG hooked up and running - no initial skin breakdown - push button tested - neuro notified.  

## 2019-06-25 NOTE — ED Provider Notes (Signed)
.  Critical Care Performed by: Sabas Sous, MD Authorized by: Sabas Sous, MD   Critical care provider statement:    Critical care time (minutes):  33   Critical care was necessary to treat or prevent imminent or life-threatening deterioration of the following conditions: status epilepticus.   Critical care was time spent personally by me on the following activities:  Discussions with consultants, evaluation of patient's response to treatment, examination of patient, ordering and performing treatments and interventions, ordering and review of laboratory studies, ordering and review of radiographic studies, pulse oximetry, re-evaluation of patient's condition, obtaining history from patient or surrogate and review of old charts      Sabas Sous, MD 06/25/19 2357

## 2019-06-25 NOTE — H&P (Signed)
[facility] History and Physical  DARRYON BASTIN CHE:[id number] DOB: 05-28-1955 DOA: 06/25/2019  Referring EDP: Luevenia Maxin PCP: [name], MD   Chief Complaint: Seizures  HPI: VANDELL KUN is a 64 y.o. male with PMH of HTN, HLD, DVT, CVA with left-sided deficits, CAD and nonconvulsive seizures who presented after abrupt onset of convulsive seizures.  History provided by patient's sister. She reports that she checks on him every Friday but that he lives with his other sister. Reports compliance with medications and that the patient is good about taking his own medications. At baseline he can walk and talk but does have cognitive deficits. Reports stopping by after work today and that he was sitting in front of the TV but not watching TV. She noticed some slight shaking and then this turned into tonic clonic status epilepticus. EMS was called. Seizure persisted for 30-40 minutes and this is the worst the sister reports he has ever had. Given Versed by EMS and then Ativan in ED which finally stopped the convulsing. Sister denies any complaints prior and that he had otherwise been in his usual state of health.    In the ED: Vitals stable on room air. Labs remarkable for glucose 163, BMP/CBC WNL. UDS and ethanol screening negative with exception of benzos as expected. UA without evidence of infection.   MR Brain, CT Head, CTA Head/Neck without acute changes. Neuro consulted by EDP who gave med recs and ordered 24 hour EEG.  Review of Systems:  Unable to obtain except for as above from sister as patient is non-verbal currently.   Past Medical History:  Diagnosis Date  . Acute deep vein thrombosis (DVT) of left upper extremity (HCC)    L brachial and basilic veins, eliquis started 08/22/2017 to continue for 3 months total   . Cognitive impairment 2007   after stroke, saw rehab but told to stop because was too upsetting to him  . History of chicken pox   . HLD (hyperlipidemia)   . HTN  (hypertension)   . NSTEMI (non-ST elevated myocardial infarction) (HCC) 02/21/2017  . Obesity   . Stroke, hemorrhagic (HCC) 2007   thought 2/2 HTN (240sbp); residual cognitive impairment, loss of R peripheral field, no driving   Past Surgical History:  Procedure Laterality Date  . ANKLE SURGERY  1990s   right foot with plate and screws  . CORONARY ARTERY BYPASS GRAFT N/A 02/24/2017   3v Procedure: CORONARY ARTERY BYPASS GRAFTING (CABG) x 3 ON PUMP USING LEFT INTERNAL MAMMARY ARTERY TO LEFT ANTERIOR DESENDING CORNARY ARTERY, RIGHT GREATER SAPHENOUS VEIN TO LEFT CIRCUMFLEX ARTERY AND POSTERIOR DESENDING ARTERY. RIGHT GREATER SAPHENOUS VEIN OBTAINED VIA ENDOVEIN HARVEST.;  Surgeon: Delight Ovens, MD  . IABP INSERTION N/A 02/21/2017   Procedure: IABP Insertion;  Surgeon: Yvonne Kendall, MD;  Location: [facility];  Service: Cardiovascular;  Laterality: N/A;  . LEFT HEART CATH AND CORONARY ANGIOGRAPHY N/A 02/21/2017   Procedure: LEFT HEART CATH AND CORONARY ANGIOGRAPHY;  Surgeon: Yvonne Kendall, MD;  Location: [facility];  Service: Cardiovascular;  Laterality: N/A;  . TEE WITHOUT CARDIOVERSION N/A 02/24/2017   Procedure: TRANSESOPHAGEAL ECHOCARDIOGRAM (TEE);  Surgeon: Delight Ovens, MD;  Location: [facility] OR;  Service: Open Heart Surgery;  Laterality: N/A;   Social History:  reports that he has never smoked. He has quit using smokeless tobacco. He reports previous alcohol use. He reports that he does not use drugs.  Allergies  Allergen Reactions  . Losartan Other (See Comments)  hyperkalemia    Family History  Problem Relation Age of Onset  . Alzheimer's disease Maternal Grandfather   . Cancer Mother        lymphoma  . Alcohol abuse Father        smoker  . Coronary artery disease Neg Hx   . Stroke Neg Hx   . Diabetes Neg Hx      Prior to Admission medications   Medication Sig Start Date End Date Taking? Authorizing Provider  aspirin EC 81 MG tablet Take 1 tablet  (81 mg total) by mouth daily. 12/17/18  Yes [name], MD  atorvastatin (LIPITOR) 80 MG tablet TAKE 1 TABLET BY MOUTH EVERY DAY Patient taking differently: Take 80 mg by mouth daily.  06/18/19  Yes [name], MD  carvedilol (COREG) 3.125 MG tablet TAKE 1 TABLET (3.125 MG TOTAL) BY MOUTH 2 (TWO) TIMES DAILY. 06/18/19  Yes [name], MD  Cyanocobalamin (B-12) 1000 MCG SUBL Place 1 tablet under the tongue daily. Patient taking differently: Place 1,000 mcg under the tongue daily.  12/26/17  Yes [name], MD  Lacosamide (VIMPAT) 100 MG TABS Take 2 tablets (200 mg total) by mouth in the morning and at bedtime. Take 1 tablet in the morning and 2 at night (total 300mg  daily) Patient taking differently: Take 200 mg by mouth See admin instructions. Take 1 tablet in the morning and 2 at night (total 300mg  daily) 05/27/19 06/26/19 Yes 05/29/19, MD  levETIRAcetam (KEPPRA) 750 MG tablet Take 2 tablets (1,500 mg total) by mouth 2 (two) times daily. 10/22/18  Yes Gerhard Munch, NP  Multiple Vitamin (MULTIVITAMIN WITH MINERALS) TABS tablet Take 1 tablet by mouth daily. 12/26/17  Yes Ihor Austin, MD  sertraline (ZOLOFT) 25 MG tablet TAKE 1 TABLET BY MOUTH EVERY DAY Patient taking differently: Take 25 mg by mouth daily.  04/26/19  Yes [name], MD  ziprasidone (GEODON) 40 MG capsule TAKE 1 CAPSULE (40 MG TOTAL) BY MOUTH AT BEDTIME. 03/01/19  Yes [name], MD   Physical Exam: Vitals:   06/25/19 1818 06/25/19 1900 06/25/19 1915 06/25/19 1945  BP: 109/90 103/63 93/74 96/71   Pulse: 75 74 73 74  Resp: 15 15 13 13   Temp:      TempSrc:      SpO2: 100% 100% 100% 100%  Weight:      Height:        Wt Readings from Last 3 Encounters:  06/25/19 76 kg  10/22/18 76 kg  08/07/18 79.4 kg    . General:  Awake with eyes open and starting to respond some to sister at bedside but still not back to baseline.  . Eyes: Right upward gaze. Starting to track sister's  voice some now.  . ENT: grossly normal lips & tongue . Neck: normal ROM . Cardiovascular: RRR, no m/r/g. No LE edema. 08/25/19 Respiratory: CTA bilaterally, no w/r/r. Normal respiratory effort. . Abdomen: soft, ntnd . Skin: no rash or induration seen on limited exam . Musculoskeletal: able to move all extremities . Psychiatric: unable to assess  . Neurologic: Symmetric facial movement. Making some noises although not intelligible. No clonic activity. Does not follow commands. Withdraws minimally to pain.           Labs on Admission:  Basic Metabolic Panel: Recent Labs  Lab 06/25/19 1707  NA 137  K 3.6  CL 104  CO2 19*  GLUCOSE 175*  BUN 5*  CREATININE 1.11  CALCIUM 8.8*   Liver Function Tests: No  results for input(s): AST, ALT, ALKPHOS, BILITOT, PROT, ALBUMIN in the last 168 hours. No results for input(s): LIPASE, AMYLASE in the last 168 hours. No results for input(s): AMMONIA in the last 168 hours. CBC: Recent Labs  Lab 06/25/19 1707  WBC 6.2  NEUTROABS 4.4  HGB 13.7  HCT 41.8  MCV 95.2  PLT 270   Cardiac Enzymes: No results for input(s): CKTOTAL, CKMB, CKMBINDEX, TROPONINI in the last 168 hours.  BNP (last 3 results) No results for input(s): BNP in the last 8760 hours.  ProBNP (last 3 results) No results for input(s): PROBNP in the last 8760 hours.  CBG: Recent Labs  Lab 06/25/19 1701  GLUCAP 163*    Radiological Exams on Admission: No results found.  EKG: Independently reviewed. HR 98. Sinus rhythm. QTc 446. No STEMI.  Assessment/Plan Principal Problem:   Status epilepticus ([place]) Active Problems:   History of stroke with current residual effects   Cognitive impairment   HLD (hyperlipidemia)   HTN (hypertension)   Seizure disorder ([place])  64 y.o. male with PMH of HTN, HLD, DVT, CVA with left-sided deficits, CAD and nonconvulsive seizures who presented after abrupt onset of convulsive seizures.  Hx recurrent seizures s/p status epilepticus - no  evidence of infection, patient otherwise in normal state of health prior - reported compliance with home meds - Home Meds: Vimpat and Keppra - Neuro consulted:  1) additional Vimpat and Keppra. 2) he is on maximal dosing of both Vimpat and Keppra, likely will need to add a third agent.  I would favor Depakote. 3) Depakote 1 g x 1 then 500 twice daily, if he is in status then I would be more aggressive with this dosing. - 24 hour EEG started around 1930 - Seizure precautions - NPO - CBGs q4h while NPO  HTN/HLD - hold home meds as NPO; currently normotensive but can add IV option PRN  Mood Stabilization - hold home Geodon and Zoloft while NPO, restart PRN  Code Status: Full DVT Prophylaxis: Lovenox Family Communication: Sister at bedside Disposition Plan: Admit to inpatient. Patient requiring 24 hour EEG and specialty consultation. He is still not back to baseline. Anticipate discharge home in 2-3 days.    Time spent: 50 minutes.   Chauncey Mann, MD [facility] Pager [phone]

## 2019-06-25 NOTE — ED Provider Notes (Signed)
[facility] EMERGENCY DEPARTMENT Provider Note   CSN: [id number] Arrival date & time: 06/25/19  1648     History Chief Complaint  Patient presents with  . Seizures   Level 5 caveat due to altered mental status [name] Cruz is a 64 y.o. male with history of DVT, CVA with residual left-sided deficits, hypertension, hyperlipidemia, NSTEMI, seizure disorder presenting brought in by EMS for evaluation of seizure-like activity.  Per EMS the patient sister called out for grand mal seizure-like activity that began at around 3:30PM.  EMS gave 5 mg of Versed at home where he was found to be actively seizing.  EMS reports that the seizure-like activity improved on the right side but persisted on the left so they gave a second dose of IM Versed 5 mg at 4:22 PM.  Shortly thereafter the patient became postictal, drowsy, sonorous.  SPO2 saturations down to 84% while seizing so he was placed on supplemental oxygen via nasal cannula.  Per EMS the patient has been compliant with his medications.  He had an ED visit 05/27/19 for evaluation of altered mental status versus seizure where it was determined that he likely did experience a complex partial seizure and Vimpat was added to his medication regimen in addition to the [place] he is prescribed.   Spoke with patient's sister who is now at the bedside.  She reports the patient has been compliant with his home medications.  She states that she returned home today and noted him to be sitting in his chair, staring off into space and unresponsive to questions.  She states that he then stood up and ambulated towards her but states that he was making some nonsensical statements.  She lowered him into a chair and at that time he began to seize.  She states that this lasted for at least 30 minutes until EMS arrival when they gave the Versed.  She reports he did have a history of alcohol abuse but has not used any drugs or alcohol for years.  The history  is provided by the EMS personnel and medical records.       Past Medical History:  Diagnosis Date  . Acute deep vein thrombosis (DVT) of left upper extremity (HCC)    L brachial and basilic veins, eliquis started 08/22/2017 to continue for 3 months total   . Cognitive impairment 2007   after stroke, saw rehab but told to stop because was too upsetting to him  . History of chicken pox   . HLD (hyperlipidemia)   . HTN (hypertension)   . NSTEMI (non-ST elevated myocardial infarction) ([place]) 02/21/2017  . Obesity   . Stroke, hemorrhagic ([place]) 2007   thought 2/2 HTN (240sbp); residual cognitive impairment, loss of R peripheral field, no driving    Patient Active Problem List   Diagnosis Date Noted  . History of DVT in adulthood 02/21/2019  . PAD (peripheral artery disease) ([place]) 12/14/2018  . Agitation 09/04/2018  . CVA (cerebral vascular accident) ([place]) 08/08/2018  . Acute CVA (cerebrovascular accident) ([place]) 08/07/2018  . Cerebellar stroke, acute ([place]) 07/10/2018  . Status epilepticus ([place]) 03/25/2018  . Unilateral occipital headache 10/03/2017  . Ischemic stroke ([place]) 08/23/2017  . Coronary artery disease involving coronary bypass graft of native heart without angina pectoris   . Atrial fibrillation ([place])   . Dysphagia, post-stroke   . Cardiac arrest ([place]) 08/09/2017  . S/P CABG x 3 02/24/2017  . NSTEMI (non-ST elevated myocardial infarction) ([place]) 02/21/2017  .  Encephalomalacia 10/28/2012  . Seizure disorder (HCC) 04/26/2012  . Alcohol abuse 04/26/2012  . History of stroke with current residual effects   . Cognitive impairment   . HLD (hyperlipidemia)   . HTN (hypertension)     Past Surgical History:  Procedure Laterality Date  . ANKLE SURGERY  1990s   right foot with plate and screws  . CORONARY ARTERY BYPASS GRAFT N/A 02/24/2017   3v Procedure: CORONARY ARTERY BYPASS GRAFTING (CABG) x 3 ON PUMP USING LEFT INTERNAL MAMMARY ARTERY TO LEFT ANTERIOR DESENDING CORNARY ARTERY,  RIGHT GREATER SAPHENOUS VEIN TO LEFT CIRCUMFLEX ARTERY AND POSTERIOR DESENDING ARTERY. RIGHT GREATER SAPHENOUS VEIN OBTAINED VIA ENDOVEIN HARVEST.;  Surgeon: Delight Ovens, MD  . IABP INSERTION N/A 02/21/2017   Procedure: IABP Insertion;  Surgeon: Yvonne Kendall, MD;  Location: [facility] INVASIVE CV LAB;  Service: Cardiovascular;  Laterality: N/A;  . LEFT HEART CATH AND CORONARY ANGIOGRAPHY N/A 02/21/2017   Procedure: LEFT HEART CATH AND CORONARY ANGIOGRAPHY;  Surgeon: Yvonne Kendall, MD;  Location: [facility] INVASIVE CV LAB;  Service: Cardiovascular;  Laterality: N/A;  . TEE WITHOUT CARDIOVERSION N/A 02/24/2017   Procedure: TRANSESOPHAGEAL ECHOCARDIOGRAM (TEE);  Surgeon: Delight Ovens, MD;  Location: [facility] OR;  Service: Open Heart Surgery;  Laterality: N/A;       Family History  Problem Relation Age of Onset  . Alzheimer's disease Maternal Grandfather   . Cancer Mother        lymphoma  . Alcohol abuse Father        smoker  . Coronary artery disease Neg Hx   . Stroke Neg Hx   . Diabetes Neg Hx     Social History   Tobacco Use  . Smoking status: Never Smoker  . Smokeless tobacco: Former Clinical biochemist  . Vaping Use: Never used  Substance Use Topics  . Alcohol use: Not Currently  . Drug use: No    Home Medications Prior to Admission medications   Medication Sig Start Date End Date Taking? Authorizing Provider  aspirin EC 81 MG tablet Take 1 tablet (81 mg total) by mouth daily. 12/17/18  Yes [name], MD  atorvastatin (LIPITOR) 80 MG tablet TAKE 1 TABLET BY MOUTH EVERY DAY Patient taking differently: Take 80 mg by mouth daily.  06/18/19  Yes [name], MD  carvedilol (COREG) 3.125 MG tablet TAKE 1 TABLET (3.125 MG TOTAL) BY MOUTH 2 (TWO) TIMES DAILY. 06/18/19  Yes [name], MD  Cyanocobalamin (B-12) 1000 MCG SUBL Place 1 tablet under the tongue daily. Patient taking differently: Place 1,000 mcg under the tongue daily.  12/26/17  Yes [name], MD   Lacosamide (VIMPAT) 100 MG TABS Take 2 tablets (200 mg total) by mouth in the morning and at bedtime. Take 1 tablet in the morning and 2 at night (total 300mg  daily) Patient taking differently: Take 200 mg by mouth See admin instructions. Take 1 tablet in the morning and 2 at night (total 300mg  daily) 05/27/19 06/26/19 Yes 05/29/19, MD  levETIRAcetam (KEPPRA) 750 MG tablet Take 2 tablets (1,500 mg total) by mouth 2 (two) times daily. 10/22/18  Yes Gerhard Munch, NP  Multiple Vitamin (MULTIVITAMIN WITH MINERALS) TABS tablet Take 1 tablet by mouth daily. 12/26/17  Yes Ihor Austin, MD  sertraline (ZOLOFT) 25 MG tablet TAKE 1 TABLET BY MOUTH EVERY DAY Patient taking differently: Take 25 mg by mouth daily.  04/26/19  Yes [name], MD  ziprasidone (GEODON) 40 MG capsule TAKE 1 CAPSULE (40 MG TOTAL)  BY MOUTH AT BEDTIME. 03/01/19  Yes [name], MD    Allergies    Losartan  Review of Systems   Review of Systems  Unable to perform ROS: Mental status change  Neurological: Positive for seizures.    Physical Exam Updated Vital Signs BP 109/73 (BP Location: Right Arm)   Pulse 78   Temp 98.7 F (37.1 C) (Oral)   Resp 20   Ht 5\' 3"  (1.6 m)   Wt 76 kg   SpO2 97%   BMI 29.68 kg/m   Physical Exam Vitals and nursing note reviewed.  Constitutional:      General: He is not in acute distress.    Appearance: He is well-developed. He is obese. He is diaphoretic.     Comments: Obtunded  HENT:     Head: Normocephalic and atraumatic.  Eyes:     General:        Right eye: No discharge.        Left eye: No discharge.     Conjunctiva/sclera: Conjunctivae normal.     Pupils: Pupils are equal, round, and reactive to light.     Comments: Pupils pinpoint but reactive to light bilaterally  Neck:     Vascular: No JVD.     Trachea: No tracheal deviation.  Cardiovascular:     Rate and Rhythm: Normal rate and regular rhythm.     Pulses: Normal pulses.  Pulmonary:      Effort: Pulmonary effort is normal.     Comments: Transmitted upper airway sounds.  SPO2 saturations 90% on room air, placed on 2 L supplemental oxygen Abdominal:     General: There is no distension.     Tenderness: There is no abdominal tenderness. There is no guarding.  Musculoskeletal:     Cervical back: Neck supple.  Skin:    General: Skin is warm.     Findings: No erythema.  Neurological:     GCS: GCS eye subscore is 1. GCS verbal subscore is 1. GCS motor subscore is 5.     Comments: Sonorous respirations noted.  Patient localizes to pain.     ED Results / Procedures / Treatments   Labs (all labs ordered are listed, but only abnormal results are displayed) Labs Reviewed  BASIC METABOLIC PANEL - Abnormal; Notable for the following components:      Result Value   CO2 19 (*)    Glucose, Bld 175 (*)    BUN 5 (*)    Calcium 8.8 (*)    All other components within normal limits  CBC WITH DIFFERENTIAL/PLATELET - Abnormal; Notable for the following components:   Abs Immature Granulocytes 0.08 (*)    All other components within normal limits  RAPID URINE DRUG SCREEN, HOSP PERFORMED - Abnormal; Notable for the following components:   Benzodiazepines POSITIVE (*)    All other components within normal limits  URINALYSIS, ROUTINE W REFLEX MICROSCOPIC - Abnormal; Notable for the following components:   Color, Urine STRAW (*)    Specific Gravity, Urine 1.003 (*)    All other components within normal limits  CBG MONITORING, ED - Abnormal; Notable for the following components:   Glucose-Capillary 163 (*)    All other components within normal limits  SARS CORONAVIRUS 2 BY RT PCR (HOSPITAL ORDER, PERFORMED IN [facility])  ETHANOL  CBG MONITORING, ED    EKG None  Radiology EEG adult  Result Date: 06/25/2019 08/25/2019, MD     06/25/2019  8:56 PM  Patient Name: [name] Cruz MRN: [id number] Epilepsy Attending: Charlsie QuestPriyanka O Yadav Referring Physician/Provider: Dr  Ritta SlotMcNeill Kirkpatrick Date: 06/25/2019 Duration: 26.06 mins Patient history: 64 year old male with a history of recurrent seizures who presents with status epilepticus. EEG to evaluate for seizure. Level of alertness: Awake AEDs during EEG study: Ativan, Keppra, VPA, Vimpat Technical aspects: This EEG study was done with scalp electrodes positioned according to the 10-20 International system of electrode placement. Electrical activity was acquired at a sampling rate of 500Hz  and reviewed with a high frequency filter of 70Hz  and a low frequency filter of 1Hz . EEG data were recorded continuously and digitally stored. Description: No clear posterior dominant rhythm was seen. EEG showed continuous generalized and lateralized right hemisphere 3-6Hz  theta-delta slowing with overriding 15-18Hz  generalized beta activity. Rare sharp wave were also seen in left anterior temporal region. Hyperventilation and photic stimulation were not performed.   ABNORMALITY - Sharp wave, left anterior temporal region - Continuous slow, generalized and lateralized right hemisphere - Excessive beta, generalized IMPRESSION: This study is suggestive of epileptogenicity in left anterior temporal region. There is also cortical dysfunction in right hemisphere likely secondary to underlying structural abnormality, post ictal state. Additionally, there is evidence of moderate diffuse encephalopathy, nonspecific etiology. The excessive beta activity seen in the background is most likely due to the effect of benzodiazepine and is a benign EEG pattern. No seizures were seen throughout the recording. Charlsie QuestPriyanka O Yadav    Procedures Procedures (including critical care time)  Medications Ordered in ED Medications  sodium chloride 0.9 % bolus 1,000 mL (0 mLs Intravenous Stopped 06/25/19 1800)    And  0.9 %  sodium chloride infusion ( Intravenous New Bag/Given 06/25/19 1832)  valproate (DEPACON) 500 mg in dextrose 5 % 50 mL IVPB (has no administration  in time range)  levETIRAcetam (KEPPRA) IVPB 1500 mg/ 100 mL premix (has no administration in time range)  lacosamide (VIMPAT) 200 mg in sodium chloride 0.9 % 25 mL IVPB (has no administration in time range)  levETIRAcetam (KEPPRA) IVPB 1000 mg/100 mL premix (0 mg Intravenous Stopped 06/25/19 1901)  LORazepam (ATIVAN) 2 MG/ML injection (2 mg  Given 06/25/19 1830)  levETIRAcetam (KEPPRA) IVPB 1000 mg/100 mL premix (0 mg Intravenous Stopped 06/25/19 1918)  lacosamide (VIMPAT) 200 mg in sodium chloride 0.9 % 25 mL IVPB (0 mg Intravenous Stopped 06/25/19 2007)  valproate (DEPACON) 1,000 mg in dextrose 5 % 50 mL IVPB (1,000 mg Intravenous New Bag/Given 06/25/19 2053)    ED Course  I have reviewed the triage vital signs and the nursing notes.  Pertinent labs & imaging results that were available during my care of the patient were reviewed by me and considered in my medical decision making (see chart for details).    MDM Rules/Calculators/A&P                          Patient presents brought in by EMS for evaluation of tonic-clonic seizure activity.  He is afebrile, borderline hypoxic in the ED on room air but exhibits sonorous respirations and is overall somnolent after being given a total of 10 mg of Versed IM via EMS.  He is also likely postictal.  He was placed on supplemental oxygen with improvement in SPO2 saturations.  Had a recent ED visit last month for seizure activity and had his medications adjusted at that time.  On my assessment he responds to pain but otherwise is unresponsive, does not answer questions or follow  commands.  He is afebrile, blood pressures are soft, vital signs otherwise stable.  We will give IV fluids and give a loading dose of Keppra.  Lab work reviewed and interpreted by myself shows no leukocytosis, no anemia, no renal insufficiency.  His CO2 is a little low consistent with recent seizure.  6:30PM Patient reassessed.  His eyes are open spontaneously now and he exhibits a  rightward gaze.  He is also exhibiting convulsions to the right upper extremity, rhythmic twitching motion.  Concerned that he is continuing to seize.  CONSULT: Spoke with Dr. Amada Jupiter with neurology, discussed patient's work-up and findings thus far.  He will plan to see the patient emergently in the ED and has ordered a another 1000 mg loading dose of Keppra as well as loading dose of Vimpat.  Patient also received 2 mg Ativan in the ED with resolution of his twitching but continues to exhibit rightward gaze and remains aphasic.  Plan for stat EEG as well.  Spoke with Dr. Morene Antu with [facility] who agrees to assume care of patient and bring him to the hospital for further evaluation and management of what appears to be status epilepticus.  Patient was seen and evaluated Dr. Morene Antu who agrees with assessment and plan at this time.  Final Clinical Impression(s) / ED Diagnoses Final diagnoses:  Status epilepticus [facility])    Rx / DC Orders ED Discharge Orders    None       Bennye Alm 06/25/19 2241    Sabas Sous, MD 06/25/19 2357

## 2019-06-25 NOTE — Progress Notes (Signed)
STAT  EEG complete - results pending. LTM to follow same lead used

## 2019-06-25 NOTE — ED Triage Notes (Signed)
Pt arrives via EMS from home with reports of seizure like activity for an hour. EMS gave 5 versed at pts home and 5 versed en route. Last dose given at 16:22. Per sister, pt takes seizure meds regularly. Hx of CVA with left sided. On arrival, pt snoring and diaphoretic.

## 2019-06-25 NOTE — Consult Note (Signed)
Neurology Consultation Reason for Consult: Seizures Referring Physician: Viona Gilmore  CC: Status epilepticus  History is obtained from: Chart review, wife  HPI: [name] Cruz is a 64 y.o. male with a history of nonconvulsive seizures presenting predominantly as aphasia who was in his normal state of health earlier today when he had abrupt onset of convulsive seizures.  He was given 10 mg of Versed on route by EMS, with continued convulsions.  On arrival, he had unilateral twitching with right gaze deviation.  His wife states that he was initially looking to the left during his seizures, but once he arrived here he began looking to the right.  He was just given Ativan with resolution of his twitching, however he continues to be aphasic and not following commands.    ROS: Unable to obtain due to altered mental status.   Past Medical History:  Diagnosis Date   Acute deep vein thrombosis (DVT) of left upper extremity (HCC)    L brachial and basilic veins, eliquis started 08/22/2017 to continue for 3 months total    Cognitive impairment 2007   after stroke, saw rehab but told to stop because was too upsetting to him   History of chicken pox    HLD (hyperlipidemia)    HTN (hypertension)    NSTEMI (non-ST elevated myocardial infarction) ([place]) 02/21/2017   Obesity    Stroke, hemorrhagic ([place]) 2007   thought 2/2 HTN (240sbp); residual cognitive impairment, loss of R peripheral field, no driving     Family History  Problem Relation Age of Onset   Alzheimer's disease Maternal Grandfather    Cancer Mother        lymphoma   Alcohol abuse Father        smoker   Coronary artery disease Neg Hx    Stroke Neg Hx    Diabetes Neg Hx      Social History:  reports that he has never smoked. He has quit using smokeless tobacco. He reports previous alcohol use. He reports that he does not use drugs.   Exam: Current vital signs: BP 109/90    Pulse 75    Temp 99.3 F (37.4 C)  (Axillary)    Resp 15    Ht 5\' 3"  (1.6 m)    Wt 76 kg    SpO2 100%    BMI 29.68 kg/m  Vital signs in last 24 hours: Temp:  [99.3 F (37.4 C)] 99.3 F (37.4 C) (06/11 1655) Pulse Rate:  [75-98] 75 (06/11 1818) Resp:  [13-17] 15 (06/11 1818) BP: (82-114)/(62-97) 109/90 (06/11 1818) SpO2:  [91 %-100 %] 100 % (06/11 1818) Weight:  [76 kg] 76 kg (06/11 1655)   Physical Exam  Constitutional: Appears well-developed and well-nourished.  Psych: Affect appropriate to situation Eyes: No scleral injection HENT: No OP obstrucion MSK: no joint deformities.  Cardiovascular: Normal rate and regular rhythm.  Respiratory: Effort normal, non-labored breathing GI: Soft.  No distension. There is no tenderness.  Skin: WDI  Neuro: Mental Status: Patient is awake, with eyes open but he is not following commands. Cranial Nerves: II: He fixates and tracks, but does not blink to threatpupils are equal, round, and reactive to light.   III,IV, VI: He has a right gaze preference, but not deviation he does come back to midline V: VII: Facial movement is symmetric.  Motor: He moves all extremities quasipurposefully, no clonic activity seen Sensory: He withdraws to noxious stimulation in all four extremities  Cerebellar: Does not perform  I have reviewed labs in epic and the results pertinent to this consultation are: BMP-unremarkable  I have reviewed the images obtained: MRI brain from 5/13-multiple chronic microhemorrhages,?  Potential seizure focus  Impression: 63 year old male with a history of recurrent seizures who presents with status epilepticus.  His convulsive activity is stopped and I suspect that he is currently postictal, but given that he has had nonconvulsive seizures in the past I would favor stat EEG with continuous monitoring.  Recommendations: 1) additional Vimpat and Keppra. 2) he is on maximal dosing of both Vimpat and Keppra, likely will need to add a third agent.  I would favor  Depakote. 3) Depakote 1 g x 1 then 500 twice daily, if he is in status then I would be more aggressive with this dosing.  Ritta Slot, MD [facility] [phone]  If 7pm- 7am, please page neurology on call as listed in AMION.

## 2019-06-26 DIAGNOSIS — R41841 Cognitive communication deficit: Secondary | ICD-10-CM

## 2019-06-26 LAB — CBC
HCT: 38 % — ABNORMAL LOW (ref 39.0–52.0)
Hemoglobin: 12.6 g/dL — ABNORMAL LOW (ref 13.0–17.0)
MCH: 31 pg (ref 26.0–34.0)
MCHC: 33.2 g/dL (ref 30.0–36.0)
MCV: 93.6 fL (ref 80.0–100.0)
Platelets: 212 10*3/uL (ref 150–400)
RBC: 4.06 MIL/uL — ABNORMAL LOW (ref 4.22–5.81)
RDW: 12.2 % (ref 11.5–15.5)
WBC: 12.1 10*3/uL — ABNORMAL HIGH (ref 4.0–10.5)
nRBC: 0 % (ref 0.0–0.2)

## 2019-06-26 LAB — COMPREHENSIVE METABOLIC PANEL
ALT: 20 U/L (ref 0–44)
AST: 24 U/L (ref 15–41)
Albumin: 3.6 g/dL (ref 3.5–5.0)
Alkaline Phosphatase: 46 U/L (ref 38–126)
Anion gap: 13 (ref 5–15)
BUN: <5 mg/dL — ABNORMAL LOW (ref 8–23)
CO2: 19 mmol/L — ABNORMAL LOW (ref 22–32)
Calcium: 8.2 mg/dL — ABNORMAL LOW (ref 8.9–10.3)
Chloride: 108 mmol/L (ref 98–111)
Creatinine, Ser: 0.8 mg/dL (ref 0.61–1.24)
GFR calc Af Amer: >60 mL/min (ref >60)
GFR calc non Af Amer: >60 mL/min (ref >60)
Glucose, Bld: 102 mg/dL — ABNORMAL HIGH (ref 70–99)
Potassium: 3.5 mmol/L (ref 3.5–5.1)
Sodium: 140 mmol/L (ref 135–145)
Total Bilirubin: 0.6 mg/dL (ref 0.3–1.2)
Total Protein: 6 g/dL — ABNORMAL LOW (ref 6.5–8.1)

## 2019-06-26 LAB — HIV ANTIBODY (ROUTINE TESTING W REFLEX): HIV Screen 4th Generation wRfx: NONREACTIVE

## 2019-06-26 LAB — GLUCOSE, CAPILLARY
Glucose-Capillary: 116 mg/dL — ABNORMAL HIGH (ref 70–99)
Glucose-Capillary: 100 mg/dL — ABNORMAL HIGH (ref 70–99)
Glucose-Capillary: 129 mg/dL — ABNORMAL HIGH (ref 70–99)

## 2019-06-26 MED ORDER — ZIPRASIDONE HCL 40 MG PO CAPS
40.0000 mg | ORAL_CAPSULE | Freq: Every day | ORAL | Status: DC
  Administered 2019-06-26: 40 mg via ORAL
  Filled 2019-06-26 (×2): qty 1

## 2019-06-26 MED ORDER — ASPIRIN EC 81 MG PO TBEC
81.0000 mg | DELAYED_RELEASE_TABLET | Freq: Every day | ORAL | Status: DC
  Administered 2019-06-26 – 2019-06-27 (×2): 81 mg via ORAL
  Filled 2019-06-26 (×2): qty 1

## 2019-06-26 MED ORDER — SODIUM CHLORIDE 0.9 % IV SOLN
INTRAVENOUS | Status: DC | PRN
  Administered 2019-06-26: 500 mL via INTRAVENOUS

## 2019-06-26 MED ORDER — VITAMIN B-12 100 MCG PO TABS
1000.0000 ug | ORAL_TABLET | Freq: Every day | ORAL | Status: DC
  Administered 2019-06-26 – 2019-06-27 (×2): 1000 ug via ORAL
  Filled 2019-06-26 (×2): qty 10

## 2019-06-26 MED ORDER — ATORVASTATIN CALCIUM 80 MG PO TABS
80.0000 mg | ORAL_TABLET | Freq: Every day | ORAL | Status: DC
  Administered 2019-06-26 – 2019-06-27 (×2): 80 mg via ORAL
  Filled 2019-06-26 (×2): qty 1

## 2019-06-26 MED ORDER — CARVEDILOL 3.125 MG PO TABS
3.1250 mg | ORAL_TABLET | Freq: Two times a day (BID) | ORAL | Status: DC
  Administered 2019-06-26 – 2019-06-27 (×2): 3.125 mg via ORAL
  Filled 2019-06-26 (×2): qty 1

## 2019-06-26 MED ORDER — SERTRALINE HCL 50 MG PO TABS
25.0000 mg | ORAL_TABLET | Freq: Every day | ORAL | Status: DC
  Administered 2019-06-26 – 2019-06-27 (×2): 25 mg via ORAL
  Filled 2019-06-26 (×2): qty 1

## 2019-06-26 NOTE — Progress Notes (Signed)
NEURO HOSPITALIST PROGRESS NOTE   Subjective: Patient awake, alert in bed, LTM running Per nursing report no seizures overnight or this morning.   Per EEG report 06/25/2019 2052 to 06/25/2019 2140 t:  This study is suggestive ofepileptogenicity in left anterior temporal region. There is alsocortical dysfunction in right hemisphere likely secondary to underlying structural abnormality, post ictal state. Additionally, there is evidence ofmoderatediffuse encephalopathy, nonspecific etiology.The excessive beta activity seen in the background is most likely due to the effect of benzodiazepine and is a benign EEG pattern. No seizures were seen throughout the recording Exam: Vitals:   06/26/19 0753 06/26/19 1226  BP: 93/70 105/64  Pulse: 69 77  Resp: 18 18  Temp: 98.7 F (37.1 C) 98.6 F (37 C)  SpO2: 100% 97%    Physical Exam  Constitutional: Appears well-developed and well-nourished.  Psych: Affect appropriate to situation Eyes: Normal external eye and conjunctiva. HENT: Normocephalic, no lesions, without obvious abnormality.   Musculoskeletal-no joint tenderness, deformity or swelling Cardiovascular: Normal rate and regular rhythm.  Respiratory: Effort normal, non-labored breathing saturations WNL GI: Soft.  No distension. There is no tenderness.  Skin: WDI   Neuro:  Mental Status: Alert, oriented name and age did not know month or year, thought content appropriate. Patient thought process is slow.  Speech fluent without evidence of aphasia.  Able to follow  Commands. Cranial Nerves: II:  Visual fields grossly normal,  III,IV, VI: ptosis not present, extra-ocular motions intact bilaterally pupils equal, round, reactive to light and accommodation V,VII: smile symmetric, facial light touch sensation normal bilaterally VIII: hearing normal bilaterally IX,X: uvula rises symmetrically XI: bilateral shoulder shrug XII: midline tongue extension Motor: Able to  raise all 4 anti gravity. Tone and bulk:normal tone throughout; no atrophy noted Sensory:  light touch intact throughout, bilaterally Cerebellar: No ataxia noted with FNF Gait: deferred    Medications:   Scheduled:  enoxaparin (LOVENOX) injection  40 mg Subcutaneous Q24H   Continuous:  sodium chloride 125 mL/hr at 06/26/19 0426   lacosamide (VIMPAT) IV 200 mg (06/26/19 1058)   levETIRAcetam 1,500 mg (06/26/19 0739)   valproate sodium 500 mg (06/26/19 0939)   PRN:  Pertinent Labs/Diagnostics:   EEG adult  Result Date: 06/25/2019 [name], MD     06/25/2019  8:56 PM Patient Name: GREGROY DOMBKOWSKI MRN: [id number] Epilepsy Attending: [name] Referring Physician/Provider: Dr Ritta Slot Date: 06/25/2019 Duration: 26.06 mins Patient history: 64 year old male with a history of recurrent seizures who presents with status epilepticus. EEG to evaluate for seizure. Level of alertness: Awake AEDs during EEG study: Ativan, Keppra, VPA, Vimpat Technical aspects: This EEG study was done with scalp electrodes positioned according to the 10-20 International system of electrode placement. Electrical activity was acquired at a sampling rate of 500Hz  and reviewed with a high frequency filter of 70Hz  and a low frequency filter of 1Hz . EEG data were recorded continuously and digitally stored. Description: No clear posterior dominant rhythm was seen. EEG showed continuous generalized and lateralized right hemisphere 3-6Hz  theta-delta slowing with overriding 15-18Hz  generalized beta activity. Rare sharp wave were also seen in left anterior temporal region. Hyperventilation and photic stimulation were not performed.   ABNORMALITY - Sharp wave, left anterior temporal region - Continuous slow, generalized and lateralized right hemisphere - Excessive beta, generalized IMPRESSION: This study is suggestive of epileptogenicity in left anterior temporal region. There  is also cortical dysfunction  in right hemisphere likely secondary to underlying structural abnormality, post ictal state. Additionally, there is evidence of moderate diffuse encephalopathy, nonspecific etiology. The excessive beta activity seen in the background is most likely due to the effect of benzodiazepine and is a benign EEG pattern. No seizures were seen throughout the recording. Lora Havens   Assessment:  64 year old male with a history of recurrent seizures who presents with status epilepticus.  on exam today no convulsive activity and no reports of seizures from nursing. Patient is awake, alert, able to follow commands. Some  slow thought processing still exists.  Recommendations: 1) continue Vimpat and Keppra.. 2) continue Depakote  500 twice daily  3) seizure precautions   Laurey Morale, MSN, NP-C [facility] [phone]  Attending neurologist's note to follow    06/26/2019, 1:06 PM

## 2019-06-26 NOTE — Procedures (Addendum)
Patient Name: [name] Cruz  MRN: [id number]  Epilepsy Attending: [name]  Referring Physician/Provider: Dr Ritta Slot Duration: 06/25/2019 2052 to 06/25/2019 2140 , 06/26/2019 0730 to 1509  Patient history: 64 year old male with a history of recurrent seizures who presents with status epilepticus. EEG to evaluate for seizure.   Level of alertness: Awake, asleep  AEDs during EEG study: Ativan, Keppra, VPA, Vimpat  Technical aspects: This EEG study was done with scalp electrodes positioned according to the 10-20 International system of electrode placement. Electrical activity was acquired at a sampling rate of 500Hz  and reviewed with a high frequency filter of 70Hz  and a low frequency filter of 1Hz . EEG data were recorded continuously and digitally stored.   Description: No clear posterior dominant rhythm was seen. Sleep was characterized by vertex waves, sleep spindles (12-14hz ), maximal frontocentral region. EEG showed continuous generalized and lateralized right hemisphere 3-6Hz  theta-delta slowing with overriding 15-18Hz  generalized beta activity. Rare sharp waves were also seen in left anterior temporal region. Hyperventilation and photic stimulation were not performed.     Parts of study were missing due to study being disconnected while moving patient to different rooms.   ABNORMALITY - Sharp wave, left anterior temporal region - Continuous slow, generalized and lateralized right hemisphere  - Excessive beta, generalized  IMPRESSION: This study is suggestive of epileptogenicity in left anterior temporal region. There is also cortical dysfunction in right hemisphere likely secondary to underlying structural abnormality, post ictal state. Additionally, there is evidence of moderate diffuse encephalopathy, nonspecific etiology. The excessive beta activity seen in the background is most likely due to the effect of benzodiazepine and is a benign EEG pattern. No seizures  were seen throughout the recording.  Jakavion Bilodeau 

## 2019-06-26 NOTE — Progress Notes (Signed)
LTM EEG discontinued - no skin breakdown at unhook.   

## 2019-06-26 NOTE — Progress Notes (Signed)
PROGRESS NOTE    Cruz [name]  HFW:[id number] DOB: [birth year] DOA: 06/25/2019 PCP: [name], MD    Brief Narrative:  64 year old gentleman with history of DVT, CVA with left-sided hemiparesis, hypertension, hyperlipidemia, non-STEMI and seizure disorder presented to the ER with EMS for evaluation of seizure-like episode.  Patient sister called EMS for grand mal seizure-like activity.  EMS gave 5 mg of Versed.  He was noted to be postictal and drowsy and aphasic.  In the emergency room   Assessment & Plan:   Principal Problem:   Status epilepticus (HCC) Active Problems:   History of stroke with current residual effects   Cognitive impairment   HLD (hyperlipidemia)   HTN (hypertension)   Seizure disorder (HCC)  Recurrent seizures: Stat EEG ruled out status epilepticus.  Remains on LTM EEG and followed by neurology. AEDs Vimpat, 200 mg twice a day, changed to IV Keppra 1500 mg twice a day, on IV Depakote, added as a new medication.  750 mg twice a day. Will defer to neurology to change to oral and or adjustment of therapy.  Continue close monitoring.  All-time seizure precautions and fall precautions.  History of stroke with residual left hemiparesis: Stable.  No episode of new stroke.  Resume aspirin and antihypertensives along with high intensity statin.  Hypertension: Resume home medications.  Stable.  Cognitive impairment/mood disorder: Patient is on Geodon and Zoloft at home.  Will resume today.   DVT prophylaxis: Lovenox Code Status: Full code Family Communication: Patient's Sister Lupita Leash on the phone Disposition Plan: Status is: Inpatient  Remains inpatient appropriate because:Ongoing diagnostic testing needed not appropriate for outpatient work up   Dispo: The patient is from: Home              Anticipated d/c is to: Home              Anticipated d/c date is: 2 days              Patient currently is not medically stable to d/c.          Consultants:   Neurology  Procedures:   None  Antimicrobials:   None   Subjective: Patient seen and examined.  No overnight events.  He had some confusion and was pulling on the IV line so he was given mittens.  Nursing reported no other concerns.  Patient himself stated he is ready to eat, he is awake enough.  Objective: Vitals:   06/25/19 2223 06/25/19 2334 06/26/19 0456 06/26/19 0753  BP: 109/73 122/81 99/72 93/70   Pulse: 78 80 78 69  Resp: 20 18 18 18   Temp: 98.7 F (37.1 C) 98.4 F (36.9 C) 98.6 F (37 C) 98.7 F (37.1 C)  TempSrc: Oral Oral Oral Oral  SpO2: 97% 100% 97% 100%  Weight:      Height:        Intake/Output Summary (Last 24 hours) at 06/26/2019 1114 Last data filed at 06/26/2019 0756 Gross per 24 hour  Intake 1977.96 ml  Output 750 ml  Net 1227.96 ml   Filed Weights   06/25/19 1655  Weight: 76 kg    Examination:  General exam: Appears calm and comfortable  Respiratory system: Clear to auscultation. Respiratory effort normal. Cardiovascular system: S1 & S2 heard, RRR. No JVD, murmurs, rubs, gallops or clicks. No pedal edema. Gastrointestinal system: Abdomen is nondistended, soft and nontender. No organomegaly or masses felt. Normal bowel sounds heard. Central nervous system: Alert and oriented.  Speech is clear.  Moving all extremities.    Data Reviewed: I have personally reviewed following labs and imaging studies  CBC: Recent Labs  Lab 06/25/19 1707 06/26/19 0030  WBC 6.2 12.1*  NEUTROABS 4.4  --   HGB 13.7 12.6*  HCT 41.8 38.0*  MCV 95.2 93.6  PLT 270 212   Basic Metabolic Panel: Recent Labs  Lab 06/25/19 1707 06/26/19 0030  NA 137 140  K 3.6 3.5  CL 104 108  CO2 19* 19*  GLUCOSE 175* 102*  BUN 5* <5*  CREATININE 1.11 0.80  CALCIUM 8.8* 8.2*   GFR: Estimated Creatinine Clearance: 85.1 mL/min (by C-G formula based on SCr of 0.8 mg/dL). Liver Function Tests: Recent Labs  Lab 06/26/19 0030  AST 24  ALT 20   ALKPHOS 46  BILITOT 0.6  PROT 6.0*  ALBUMIN 3.6   No results for input(s): LIPASE, AMYLASE in the last 168 hours. No results for input(s): AMMONIA in the last 168 hours. Coagulation Profile: No results for input(s): INR, PROTIME in the last 168 hours. Cardiac Enzymes: No results for input(s): CKTOTAL, CKMB, CKMBINDEX, TROPONINI in the last 168 hours. BNP (last 3 results) No results for input(s): PROBNP in the last 8760 hours. HbA1C: No results for input(s): HGBA1C in the last 72 hours. CBG: Recent Labs  Lab 06/25/19 1701 06/25/19 2338 06/26/19 0452 06/26/19 0755  GLUCAP 163* 106* 116* 100*   Lipid Profile: No results for input(s): CHOL, HDL, LDLCALC, TRIG, CHOLHDL, LDLDIRECT in the last 72 hours. Thyroid Function Tests: No results for input(s): TSH, T4TOTAL, FREET4, T3FREE, THYROIDAB in the last 72 hours. Anemia Panel: No results for input(s): VITAMINB12, FOLATE, FERRITIN, TIBC, IRON, RETICCTPCT in the last 72 hours. Sepsis Labs: No results for input(s): PROCALCITON, LATICACIDVEN in the last 168 hours.  Recent Results (from the past 240 hour(s))  SARS Coronavirus 2 by RT PCR (hospital order, performed in [facility] hospital lab) Nasopharyngeal Nasopharyngeal Swab     Status: None   Collection Time: 06/25/19  7:56 PM   Specimen: Nasopharyngeal Swab  Result Value Ref Range Status   SARS Coronavirus 2 NEGATIVE NEGATIVE Final    Comment: (NOTE) SARS-CoV-2 target nucleic acids are NOT DETECTED.  The SARS-CoV-2 RNA is generally detectable in upper and lower respiratory specimens during the acute phase of infection. The lowest concentration of SARS-CoV-2 viral copies this assay can detect is 250 copies / mL. A negative result does not preclude SARS-CoV-2 infection and should not be used as the sole basis for treatment or other patient management decisions.  A negative result may occur with improper specimen collection / handling, submission of specimen other than  nasopharyngeal swab, presence of viral mutation(s) within the areas targeted by this assay, and inadequate number of viral copies (<250 copies / mL). A negative result must be combined with clinical observations, patient history, and epidemiological information.  Fact Sheet for Patients:   BoilerBrush.com.cy  Fact Sheet for Healthcare Providers: https://pope.com/  This test is not yet approved or  cleared by the [place] FDA and has been authorized for detection and/or diagnosis of SARS-CoV-2 by FDA under an Emergency Use Authorization (EUA).  This EUA will remain in effect (meaning this test can be used) for the duration of the COVID-19 declaration under Section 564(b)(1) of the Act, 21 U.S.C. section 360bbb-3(b)(1), unless the authorization is terminated or revoked sooner.  Performed at [facility], [address], [place], [place] [address]          Radiology Studies:  EEG adult  Result Date: 06/25/2019 Lora Havens, MD     06/25/2019  8:56 PM Patient Name: [name] Cruz MRN: [id number] Epilepsy Attending: Lora Havens Referring Physician/Provider: Dr Roland Rack Date: 06/25/2019 Duration: 26.06 mins Patient history: 63 year old male with a history of recurrent seizures who presents with status epilepticus. EEG to evaluate for seizure. Level of alertness: Awake AEDs during EEG study: Ativan, Keppra, VPA, Vimpat Technical aspects: This EEG study was done with scalp electrodes positioned according to the 10-20 International system of electrode placement. Electrical activity was acquired at a sampling rate of 500Hz  and reviewed with a high frequency filter of 70Hz  and a low frequency filter of 1Hz . EEG data were recorded continuously and digitally stored. Description: No clear posterior dominant rhythm was seen. EEG showed continuous generalized and lateralized right hemisphere 3-6Hz  theta-delta slowing with  overriding 15-18Hz  generalized beta activity. Rare sharp wave were also seen in left anterior temporal region. Hyperventilation and photic stimulation were not performed.   ABNORMALITY - Sharp wave, left anterior temporal region - Continuous slow, generalized and lateralized right hemisphere - Excessive beta, generalized IMPRESSION: This study is suggestive of epileptogenicity in left anterior temporal region. There is also cortical dysfunction in right hemisphere likely secondary to underlying structural abnormality, post ictal state. Additionally, there is evidence of moderate diffuse encephalopathy, nonspecific etiology. The excessive beta activity seen in the background is most likely due to the effect of benzodiazepine and is a benign EEG pattern. No seizures were seen throughout the recording. Priyanka Barbra Sarks        Scheduled Meds: . enoxaparin (LOVENOX) injection  40 mg Subcutaneous Q24H   Continuous Infusions: . sodium chloride 125 mL/hr at 06/26/19 0426  . lacosamide (VIMPAT) IV 200 mg (06/26/19 1058)  . levETIRAcetam 1,500 mg (06/26/19 0739)  . valproate sodium 500 mg (06/26/19 0939)     LOS: 1 day    Time spent: 30 minutes    Barb Merino, MD [facility] Pager [phone]

## 2019-06-27 MED ORDER — VIMPAT 100 MG PO TABS: 200.0000 mg | ORAL_TABLET | Freq: Two times a day (BID) | ORAL | 0 refills | Status: DC

## 2019-06-27 MED ORDER — DIVALPROEX SODIUM 500 MG PO DR TAB
500.0000 mg | DELAYED_RELEASE_TABLET | Freq: Two times a day (BID) | ORAL | 2 refills | Status: DC
Start: 2019-06-27 — End: 2019-11-19

## 2019-06-27 NOTE — Progress Notes (Signed)
OT Cancellation Note  Patient Details Name: [name] Cruz MRN: [id number] DOB: 04-17-1955   Cancelled Treatment:    Reason Eval/Treat Not Completed: OT screened, no needs identified, will sign off.  Per PT pt likely at baseline.  Chart review indicates pt with signficant cognitive deficit PTA, and daughter provides supervision.  OT will defer eval at this time.  Eber Jones., OTR/L Acute Rehabilitation Services Pager [phone] Office [phone]   Jeani Hawking M 06/27/2019, 10:02 AM

## 2019-06-27 NOTE — Discharge Summary (Signed)
Physician Discharge Summary  [name] Cruz HER:[id number] DOB: [birth year] DOA: 6/11/Cruz  PCP: [name] Boyden, Cruz  Admit date: 6/11/Cruz Discharge date: 6/13/Cruz  Admitted From: Home Disposition: Home  Recommendations for Outpatient Follow-up:  1. Follow up with PCP in 1-2 weeks 2. Follow-up with your neurologist as scheduled Discharge Condition: Stable CODE STATUS: Full code Diet recommendation: Low-salt diet  Discharge summary: 64 year old gentleman with history of DVT, CVA with left-sided hemiparesis, hypertension, hyperlipidemia, coronary artery disease and seizure disorder along with mood disorder presented to the emergency room from home with evaluation for seizure-like episodes witnessed by patient's sister.  EMS gave him 5 mg of Versed.  He was noted to be postictal and drowsy as well as aphasic.  In the emergency room, he was aphasic, a stat EEG was done that ruled out status epilepticus.  Was admitted with neurology consultation.  Recurrent tonic-clonic seizure: Happened prior to arrival.  Stat EEG ruled out status epilepticus.  Remains on long-term EEG monitoring and followed by neurology.  He was not able to take medications by mouth and was treated with IV antiepileptic medications.  He has stabilized now.  No seizure for last 48 hours.  Following adjustment of doses are done. Vimpat 200 mg twice a day, he will continue to take same doses. Keppra 1500 mg twice a day, he will continue to take same doses. Depakote, added as new medications, 500 mg twice a day as per neurology recommendation. Rest of the chronic medical issues remained stable.  He is to resume his medications including aspirin, beta-blockers.  He is also on Geodon and Zoloft for his cognitive impairment and mood disorder that he will continue.  Does have some residual left hemiparesis with no ambulatory dysfunction.  Going home with family. Patient and family aware about all seizure precautions and they have  been taking seizure precautions for long time.  Discharge Diagnoses:  Principal Problem:   Status epilepticus (HCC) Active Problems:   History of stroke with current residual effects   Cognitive impairment   HLD (hyperlipidemia)   HTN (hypertension)   Seizure disorder [name])    Discharge Instructions  Discharge Instructions    Diet - low sodium heart healthy   Complete by: As directed    Driving Restrictions   Complete by: As directed    No driving until seizure-free for 6 months or cleared by your neurologist.   Increase activity slowly   Complete by: As directed      Allergies as of 6/13/Cruz      Reactions   Losartan Other (See Comments)   hyperkalemia      Medication List    TAKE these medications   aspirin EC 81 MG tablet Take 1 tablet (81 mg total) by mouth daily.   atorvastatin 80 MG tablet Commonly known as: LIPITOR TAKE 1 TABLET BY MOUTH EVERY DAY   B-12 1000 MCG Subl Place 1 tablet under the tongue daily. What changed: how much to take   carvedilol 3.125 MG tablet Commonly known as: COREG TAKE 1 TABLET (3.125 MG TOTAL) BY MOUTH 2 (TWO) TIMES DAILY.   divalproex 500 MG [name] tablet Commonly known as: Depakote Take 1 tablet (500 mg total) by mouth 2 (two) times daily.   levETIRAcetam 750 MG tablet Commonly known as: KEPPRA Take 2 tablets (1,500 mg total) by mouth 2 (two) times daily.   multivitamin with minerals Tabs tablet Take 1 tablet by mouth daily.   sertraline 25 MG tablet Commonly known as: ZOLOFT TAKE  1 TABLET BY MOUTH EVERY DAY   Vimpat 100 MG Tabs Generic drug: Lacosamide Take 2 tablets (200 mg total) by mouth in the morning and at bedtime. 200 mg 2 times a day.  Total 400 mg. What changed: additional instructions   ziprasidone 40 MG capsule Commonly known as: GEODON TAKE 1 CAPSULE (40 MG TOTAL) BY MOUTH AT BEDTIME.       Allergies  Allergen Reactions  . Losartan Other (See Comments)    hyperkalemia     Consultations:  Neurology   Procedures/Studies: EEG adult  Result Date: Cruz-06-19 [name] Cruz     Jun 19, Cruz  8:56 PM Patient Name: [name] Cruz MRN: [id number] Epilepsy Attending: Charlsie Cruz Referring Physician/Provider: Dr [name] Cruz Date: June 19, Cruz Duration: 26.06 mins Patient history: 64 year old male with a history of recurrent seizures who presents with status epilepticus. EEG to evaluate for seizure. Level of alertness: Awake AEDs during EEG study: Ativan, Keppra, VPA, Vimpat Technical aspects: This EEG study was done with scalp electrodes positioned according to the 10-20 International system of electrode placement. Electrical activity was acquired at a sampling rate of 500Hz  and reviewed with a high frequency filter of 70Hz  and a low frequency filter of 1Hz . EEG data were recorded continuously and digitally stored. Description: No clear posterior dominant rhythm was seen. EEG showed continuous generalized and lateralized right hemisphere 3-6Hz  theta-delta slowing with overriding 15-18Hz  generalized beta activity. Rare sharp wave were also seen in left anterior temporal region. Hyperventilation and photic stimulation were not performed.   ABNORMALITY - Sharp wave, left anterior temporal region - Continuous slow, generalized and lateralized right hemisphere - Excessive beta, generalized IMPRESSION: This study is suggestive of epileptogenicity in left anterior temporal region. There is also cortical dysfunction in right hemisphere likely secondary to underlying structural abnormality, post ictal state. Additionally, there is evidence of moderate diffuse encephalopathy, nonspecific etiology. The excessive beta activity seen in the background is most likely due to the effect of benzodiazepine and is a benign EEG pattern. No seizures were seen throughout the recording. [name] Cruz   Overnight EEG with video  Result Date: 6/12/Cruz , Cruz      6/12/Cruz  5:36 PM Patient Name: [name] Cruz MRN: [name] [facility] Epilepsy Attending: 8/12/Cruz Referring Physician/Provider: Dr [name] Cruz Duration: 06/19/Cruz 2052 to Jun 19, Cruz 2140 , 6/12/Cruz 0730 to 1509  Patient history: 64 year old male with a history of recurrent seizures who presents with status epilepticus. EEG to evaluate for seizure.  Level of alertness: Awake, asleep  AEDs during EEG study: Ativan, Keppra, VPA, Vimpat  Technical aspects: This EEG study was done with scalp electrodes positioned according to the 10-20 International system of electrode placement. Electrical activity was acquired at a sampling rate of 500Hz  and reviewed with a high frequency filter of 70Hz  and a low frequency filter of 1Hz . EEG data were recorded continuously and digitally stored.  Description: No clear posterior dominant rhythm was seen. Sleep was characterized by vertex waves, sleep spindles (12-14hz ), maximal frontocentral region. EEG showed continuous generalized and lateralized right hemisphere 3-6Hz  theta-delta slowing with overriding 15-18Hz  generalized beta activity. Rare sharp waves were also seen in left anterior temporal region. Hyperventilation and photic stimulation were not performed.   Parts of study were missing due to study being disconnected while moving patient to different rooms.  ABNORMALITY - Sharp wave, left anterior temporal region - Continuous slow, generalized and lateralized right hemisphere - Excessive beta, generalized  IMPRESSION: This study is suggestive of  epileptogenicity in left anterior temporal region. There is also cortical dysfunction in right hemisphere likely secondary to underlying structural abnormality, post ictal state. Additionally, there is evidence of moderate diffuse encephalopathy, nonspecific etiology. The excessive beta activity seen in the background is most likely due to the effect of benzodiazepine and is a benign EEG pattern. No seizures were seen  throughout the recording.  [name] Cruz O Yadav    (Echo, Carotid, EGD, Colonoscopy, ERCP)    Subjective: Patient seen and examined.  Walking in the hallway with physical therapy.  He is independent.  He is denying any complaints.  Eager to go home.   Discharge Exam: Vitals:   06/27/19 0821 06/27/19 1144  BP: 125/80 126/70  Pulse: 63 70  Resp: 16 20  Temp: 98.6 F (37 C) 98.5 F (36.9 C)  SpO2: 100% 100%   Vitals:   06/26/19 2350 06/27/19 0347 06/27/19 0821 06/27/19 1144  BP: 119/80 103/75 125/80 126/70  Pulse: 68 70 63 70  Resp: 18 19 16 20   Temp: 98.4 F (36.9 C) 98.5 F (36.9 C) 98.6 F (37 C) 98.5 F (36.9 C)  TempSrc: Oral Oral Oral Oral  SpO2: 98% 96% 100% 100%  Weight:      Height:        General: Pt is alert, awake, not in acute distress Cardiovascular: RRR, S1/S2 +, no rubs, no gallops Respiratory: CTA bilaterally, no wheezing, no rhonchi Abdominal: Soft, NT, ND, bowel sounds + Extremities: no edema, no cyanosis    The results of significant diagnostics from this hospitalization (including imaging, microbiology, ancillary and laboratory) are listed below for reference.     Microbiology: Recent Results (from the past 240 hour(s))  SARS Coronavirus 2 by RT PCR (hospital order, performed in [facility] hospital lab) Nasopharyngeal Nasopharyngeal Swab     Status: None   Collection Time: 06/25/19  7:56 PM   Specimen: Nasopharyngeal Swab  Result Value Ref Range Status   SARS Coronavirus 2 NEGATIVE NEGATIVE Final    Comment: (NOTE) SARS-CoV-2 target nucleic acids are NOT DETECTED.  The SARS-CoV-2 RNA is generally detectable in upper and lower respiratory specimens during the acute phase of infection. The lowest concentration of SARS-CoV-2 viral copies this assay can detect is 250 copies / mL. A negative result does not preclude SARS-CoV-2 infection and should not be used as the sole basis for treatment or other patient management decisions.  A negative  result may occur with improper specimen collection / handling, submission of specimen other than nasopharyngeal swab, presence of viral mutation(s) within the areas targeted by this assay, and inadequate number of viral copies (<250 copies / mL). A negative result must be combined with clinical observations, patient history, and epidemiological information.  Fact Sheet for Patients:   StrictlyIdeas.no  Fact Sheet for Healthcare Providers: BankingDealers.co.za  This test is not yet approved or  cleared by the [place] FDA and has been authorized for detection and/or diagnosis of SARS-CoV-2 by FDA under an Emergency Use Authorization (EUA).  This EUA will remain in effect (meaning this test can be used) for the duration of the COVID-19 declaration under Section 564(b)(1) of the Act, 21 U.S.C. section 360bbb-3(b)(1), unless the authorization is terminated or revoked sooner.  Performed at [facility], [place] [address], [place],  [address]      Labs: BNP (last 3 results) No results for input(s): BNP in the last 8760 hours. Basic Metabolic Panel: Recent Labs  Lab 06/25/19 1707 06/26/19 0030  NA  137 140  K 3.6 3.5  CL 104 108  CO2 19* 19*  GLUCOSE 175* 102*  BUN 5* <5*  CREATININE 1.11 0.80  CALCIUM 8.8* 8.2*   Liver Function Tests: Recent Labs  Lab 06/26/19 0030  AST 24  ALT 20  ALKPHOS 46  BILITOT 0.6  PROT 6.0*  ALBUMIN 3.6   No results for input(s): LIPASE, AMYLASE in the last 168 hours. No results for input(s): AMMONIA in the last 168 hours. CBC: Recent Labs  Lab 06/25/19 1707 06/26/19 0030  WBC 6.2 12.1*  NEUTROABS 4.4  --   HGB 13.7 12.6*  HCT 41.8 38.0*  MCV 95.2 93.6  PLT 270 212   Cardiac Enzymes: No results for input(s): CKTOTAL, CKMB, CKMBINDEX, TROPONINI in the last 168 hours. BNP: Invalid input(s): POCBNP CBG: Recent Labs  Lab 06/25/19 1701 06/25/19 2338 06/26/19 0452  06/26/19 0755 06/26/19 1225  GLUCAP 163* 106* 116* 100* 129*   D-Dimer No results for input(s): DDIMER in the last 72 hours. Hgb A1c No results for input(s): HGBA1C in the last 72 hours. Lipid Profile No results for input(s): CHOL, HDL, LDLCALC, TRIG, CHOLHDL, LDLDIRECT in the last 72 hours. Thyroid function studies No results for input(s): TSH, T4TOTAL, T3FREE, THYROIDAB in the last 72 hours.  Invalid input(s): FREET3 Anemia work up No results for input(s): VITAMINB12, FOLATE, FERRITIN, TIBC, IRON, RETICCTPCT in the last 72 hours. Urinalysis    Component Value Date/Time   COLORURINE STRAW (A) 06/11/Cruz 1737   APPEARANCEUR CLEAR 06/11/Cruz 1737   LABSPEC 1.003 (L) 06/11/Cruz 1737   PHURINE 6.0 06/11/Cruz 1737   GLUCOSEU NEGATIVE 06/11/Cruz 1737   HGBUR NEGATIVE 06/11/Cruz 1737   BILIRUBINUR NEGATIVE 06/11/Cruz 1737   KETONESUR NEGATIVE 06/11/Cruz 1737   PROTEINUR NEGATIVE 06/11/Cruz 1737   UROBILINOGEN 0.2 04/26/2012 1900   NITRITE NEGATIVE 06/11/Cruz 1737   LEUKOCYTESUR NEGATIVE 06/11/Cruz 1737   Sepsis Labs Invalid input(s): PROCALCITONIN,  WBC,  LACTICIDVEN Microbiology Recent Results (from the past 240 hour(s))  SARS Coronavirus 2 by RT PCR (hospital order, performed in [facility] hospital lab) Nasopharyngeal Nasopharyngeal Swab     Status: None   Collection Time: 06/25/19  7:56 PM   Specimen: Nasopharyngeal Swab  Result Value Ref Range Status   SARS Coronavirus 2 NEGATIVE NEGATIVE Final    Comment: (NOTE) SARS-CoV-2 target nucleic acids are NOT DETECTED.  The SARS-CoV-2 RNA is generally detectable in upper and lower respiratory specimens during the acute phase of infection. The lowest concentration of SARS-CoV-2 viral copies this assay can detect is 250 copies / mL. A negative result does not preclude SARS-CoV-2 infection and should not be used as the sole basis for treatment or other patient management decisions.  A negative result may occur with improper  specimen collection / handling, submission of specimen other than nasopharyngeal swab, presence of viral mutation(s) within the areas targeted by this assay, and inadequate number of viral copies (<250 copies / mL). A negative result must be combined with clinical observations, patient history, and epidemiological information.  Fact Sheet for Patients:   BoilerBrush.com.cy  Fact Sheet for Healthcare Providers: https://pope.com/  This test is not yet approved or  cleared by the [place] FDA and has been authorized for detection and/or diagnosis of SARS-CoV-2 by FDA under an Emergency Use Authorization (EUA).  This EUA will remain in effect (meaning this test can be used) for the duration of the COVID-19 declaration under Section 564(b)(1) of the Act, 21 U.S.C. section 360bbb-3(b)(1), unless the authorization is  terminated or revoked sooner.  Performed at [facility], [address] [place], [place] [address]      Time coordinating discharge:  32 minutes  SIGNED:   Dorcas CarrowKuber Lela Murfin, Cruz  [facility] 6/13/Cruz, 12:10 PM

## 2019-06-27 NOTE — Evaluation (Signed)
Physical Therapy Evaluation and Discharge Patient Details Name: [name] Cruz MRN: [id number] DOB: 1955-02-14 Today's Date: 06/27/2019   History of Present Illness   64 y.o. male with PMH of HTN, HLD, DVT, CVA with left-sided deficits, CAD and nonconvulsive seizures who presented 06/25/19 after abrupt onset of convulsive seizures. At baseline he can walk and talk but does have cognitive deficits and lives with a sister. MR Brain, CT Head, CTA Head/Neck without acute changes. EEG suggests epileptogenicity in left anterior temporal region  Clinical Impression   Patient evaluated by Physical Therapy with no further acute PT needs identified. Patient independent with balance and gait (scored 24/24 on DGI). No family present to confirm if his cognition is at baseline, however noted patient has history of decr cognition and this is likely his baseline. Per chart, lives with sister who can provide 24/7 supervision (not needed for mobility but for cognition). PT is signing off. Thank you for this referral.     Follow Up Recommendations No PT follow up    Equipment Recommendations  None recommended by PT    Recommendations for Other Services       Precautions / Restrictions Precautions Precautions: Other (comment) Precaution Comments: seizure precautions      Mobility  Bed Mobility Overal bed mobility: Independent                Transfers Overall transfer level: Independent Equipment used: None                Ambulation/Gait Ambulation/Gait assistance: Independent Gait Distance (Feet): 300 Feet Assistive device: None Gait Pattern/deviations: WFL(Within Functional Limits)   Gait velocity interpretation: >2.62 ft/sec, indicative of community ambulatory General Gait Details: able to avoid obstacles, vary speed, sudden stop all without impairment  Financial trader Rankin (Stroke Patients Only)       Balance Overall balance  assessment: Independent                               Standardized Balance Assessment Standardized Balance Assessment : Dynamic Gait Index   Dynamic Gait Index Level Surface: Normal Change in Gait Speed: Normal Gait with Horizontal Head Turns: Normal Gait with Vertical Head Turns: Normal Gait and Pivot Turn: Normal Step Over Obstacle: Normal Step Around Obstacles: Normal Steps: Normal Total Score: 24       Pertinent Vitals/Pain Pain Assessment: No/denies pain    Home Living Family/patient expects to be discharged to:: Private residence Living Arrangements: Other relatives (sister) Available Help at Discharge: Family;Available 24 hours/day Type of Home: Mobile home Home Access: Stairs to enter Entrance Stairs-Rails: Can reach both Entrance Stairs-Number of Steps: 3 Home Layout: One level Home Equipment: Walker - 2 wheels;Cane - single point Additional Comments: all information from previous medical record    Prior Function Level of Independence: Independent               Hand Dominance   Dominant Hand: Right    Extremity/Trunk Assessment   Upper Extremity Assessment Upper Extremity Assessment: Defer to OT evaluation    Lower Extremity Assessment Lower Extremity Assessment: Overall WFL for tasks assessed    Cervical / Trunk Assessment Cervical / Trunk Assessment: Normal  Communication   Communication: Expressive difficulties (word-finding )  Cognition Arousal/Alertness: Awake/alert Behavior During Therapy: WFL for tasks assessed/performed Overall Cognitive Status: No family/caregiver present to determine baseline  cognitive functioning Area of Impairment: Orientation;Memory;Following commands                 Orientation Level: Time;Situation (not month or year; "heart attack" reason for admission)   Memory: Decreased short-term memory Following Commands: Follows one step commands consistently;Follows one step commands with  increased time       General Comments: unsure if pt is at his baseline as chart notes history of decr cognition      General Comments      Exercises     Assessment/Plan    PT Assessment Patent does not need any further PT services  PT Problem List         PT Treatment Interventions      PT Goals (Current goals can be found in the Care Plan section)  Acute Rehab PT Goals Patient Stated Goal: go home today PT Goal Formulation: All assessment and education complete, DC therapy    Frequency     Barriers to discharge        Co-evaluation               AM-PAC PT "6 Clicks" Mobility  Outcome Measure Help needed turning from your back to your side while in a flat bed without using bedrails?: None Help needed moving from lying on your back to sitting on the side of a flat bed without using bedrails?: None Help needed moving to and from a bed to a chair (including a wheelchair)?: None Help needed standing up from a chair using your arms (e.g., wheelchair or bedside chair)?: None Help needed to walk in hospital room?: None Help needed climbing 3-5 steps with a railing? : None 6 Click Score: 24    End of Session Equipment Utilized During Treatment: Gait belt Activity Tolerance: Patient tolerated treatment well Patient left: in chair;with call bell/phone within reach;with chair alarm set Nurse Communication: Mobility status PT Visit Diagnosis: Other symptoms and signs involving the nervous system (R29.898)    Time: 8563-1497 PT Time Calculation (min) (ACUTE ONLY): 25 min   Charges:   PT Evaluation $PT Eval Low Complexity: 1 Low           Jerolyn Center, PT Pager [phone]   Zena Amos 06/27/2019, 9:55 AM

## 2019-06-27 NOTE — Progress Notes (Addendum)
NEURO HOSPITALIST PROGRESS NOTE   Subjective: Patient awake, alert sitting up in chair. LTM dc'd yesterday. Per nursing report no seizures overnight or this morning.    Exam: Vitals:   06/27/19 0347 06/27/19 0821  BP: 103/75 125/80  Pulse: 70 63  Resp: 19 16  Temp: 98.5 F (36.9 C) 98.6 F (37 C)  SpO2: 96% 100%    Physical Exam  Constitutional: Appears well-developed and well-nourished.  Psych: Affect appropriate to situation Eyes: Normal external eye and conjunctiva. HENT: Normocephalic, no lesions, without obvious abnormality.   Musculoskeletal-no joint tenderness, deformity or swelling Cardiovascular: Normal rate and regular rhythm.  Respiratory: Effort normal, non-labored breathing saturations WNL GI: Soft.  No distension. There is no tenderness.  Skin: WDI   Neuro:  Mental Status: Alert, oriented name and birth date, did not know month or year, thought content appropriate. Patient thought process is quicker and improved since yesterday.  Speech fluent without evidence of aphasia.  Able to follow  Commands. Cranial Nerves: II:  Visual fields grossly normal,  III,IV, VI: ptosis not present, extra-ocular motions intact bilaterally pupils equal, round, reactive to light and accommodation V,VII: smile symmetric, facial light touch sensation normal bilaterally VIII: hearing normal bilaterally IX,X: uvula rises symmetrically XI: bilateral shoulder shrug XII: midline tongue extension Motor: Able to raise all 4 anti gravity. Tone and bulk:normal tone throughout; no atrophy noted Sensory:  light touch intact throughout, bilaterally Cerebellar: No ataxia noted with FNF Gait: deferred    Medications:   Scheduled: . aspirin EC  81 mg Oral Daily  . atorvastatin  80 mg Oral Daily  . carvedilol  3.125 mg Oral BID  . enoxaparin (LOVENOX) injection  40 mg Subcutaneous Q24H  . sertraline  25 mg Oral Daily  . vitamin B-12  1,000 mcg Oral Daily  .  ziprasidone  40 mg Oral QHS   Continuous: . sodium chloride 125 mL/hr at 06/27/19 0318  . sodium chloride 10 mL/hr at 06/27/19 0500  . lacosamide (VIMPAT) IV Stopped (06/26/19 2250)  . levETIRAcetam 1,500 mg (06/27/19 0721)  . valproate sodium 500 mg (06/27/19 0927)   PRN:  Pertinent Labs/Diagnostics:   EEG adult  Result Date: 06/25/2019 [name], MD     06/25/2019  8:56 PM Patient Name: [name] Cruz MRN: [id number] Epilepsy Attending: [name] Referring Physician/Provider: Dr Ritta Slot Date: 06/25/2019 Duration: 26.06 mins Patient history: 64 year old male with a history of recurrent seizures who presents with status epilepticus. EEG to evaluate for seizure. Level of alertness: Awake AEDs during EEG study: Ativan, Keppra, VPA, Vimpat Technical aspects: This EEG study was done with scalp electrodes positioned according to the 10-20 International system of electrode placement. Electrical activity was acquired at a sampling rate of 500Hz  and reviewed with a high frequency filter of 70Hz  and a low frequency filter of 1Hz . EEG data were recorded continuously and digitally stored. Description: No clear posterior dominant rhythm was seen. EEG showed continuous generalized and lateralized right hemisphere 3-6Hz  theta-delta slowing with overriding 15-18Hz  generalized beta activity. Rare sharp wave were also seen in left anterior temporal region. Hyperventilation and photic stimulation were not performed.   ABNORMALITY - Sharp wave, left anterior temporal region - Continuous slow, generalized and lateralized right hemisphere - Excessive beta, generalized IMPRESSION: This study is suggestive of epileptogenicity in left anterior temporal region. There is also cortical dysfunction in right  hemisphere likely secondary to underlying structural abnormality, post ictal state. Additionally, there is evidence of moderate diffuse encephalopathy, nonspecific etiology. The excessive beta  activity seen in the background is most likely due to the effect of benzodiazepine and is a benign EEG pattern. No seizures were seen throughout the recording. Priyanka Barbra Sarks   Overnight EEG with video  Result Date: 06/26/2019 Lora Havens, MD     06/26/2019  5:36 PM Patient Name: [name] Cruz MRN: [id number] Epilepsy Attending: Lora Havens Referring Physician/Provider: Dr Roland Rack Duration: 06/25/2019 2052 to 06/25/2019 2140 , 06/26/2019 0730 to 1509  Patient history: 64 year old male with a history of recurrent seizures who presents with status epilepticus. EEG to evaluate for seizure.  Level of alertness: Awake, asleep  AEDs during EEG study: Ativan, Keppra, VPA, Vimpat  Technical aspects: This EEG study was done with scalp electrodes positioned according to the 10-20 International system of electrode placement. Electrical activity was acquired at a sampling rate of 500Hz  and reviewed with a high frequency filter of 70Hz  and a low frequency filter of 1Hz . EEG data were recorded continuously and digitally stored.  Description: No clear posterior dominant rhythm was seen. Sleep was characterized by vertex waves, sleep spindles (12-14hz ), maximal frontocentral region. EEG showed continuous generalized and lateralized right hemisphere 3-6Hz  theta-delta slowing with overriding 15-18Hz  generalized beta activity. Rare sharp waves were also seen in left anterior temporal region. Hyperventilation and photic stimulation were not performed.   Parts of study were missing due to study being disconnected while moving patient to different rooms.  ABNORMALITY - Sharp wave, left anterior temporal region - Continuous slow, generalized and lateralized right hemisphere - Excessive beta, generalized  IMPRESSION: This study is suggestive of epileptogenicity in left anterior temporal region. There is also cortical dysfunction in right hemisphere likely secondary to underlying structural abnormality, post  ictal state. Additionally, there is evidence of moderate diffuse encephalopathy, nonspecific etiology. The excessive beta activity seen in the background is most likely due to the effect of benzodiazepine and is a benign EEG pattern. No seizures were seen throughout the recording.  Lora Havens   Assessment:  64 year old male with a history of recurrent seizures who presents with status epilepticus.  on exam today no convulsive activity and no reports of seizures from nursing. Patient is awake, alert, able to follow commands. Some  slow thought processing still exists.  Recommendations: 1) continue Vimpat 200 mg BID and Keppra 1500 mg BID.Marland Kitchen 2) continue Depakote  500 twice daily  3) seizure precautions 4/ follow up with outpatient neurology 5)  Neurology to sign-off at this time, call with any further questions or concerns.  Per [facility] statutes, patients with seizures are not allowed to drive until  they have been seizure-free for six months. Use caution when using heavy equipment or power tools. Avoid working on ladders or at heights. Take showers instead of baths. Ensure the water temperature is not too high on the home water heater. Do not go swimming alone. When caring for infants or small children, sit down when holding, feeding, or changing them to minimize risk of injury to the child in the event you have a seizure.   Also, Maintain good sleep hygiene. Avoid alcohol.    Laurey Morale, MSN, NP-C [facility] [phone]      06/27/2019, 9:49 AM

## 2019-06-28 ENCOUNTER — Telehealth: Payer: Self-pay

## 2019-06-28 NOTE — Telephone Encounter (Signed)
Please call to schedule. I have multiple 30 min appts in the next 2 weeks.  Will need to touch base with Calandra on new changes made to my schedule.

## 2019-06-28 NOTE — Telephone Encounter (Signed)
Transition Care Management Follow-up Telephone Call  Date of discharge and from where: 06/27/2019, [facility]  How have you been since you were released from the hospital? Steward Drone (sister) states that patient is doing better since his hospitalization.   Any questions or concerns? No   Items Reviewed:  Did the pt receive and understand the discharge instructions provided? Yes   Medications obtained and verified? Yes   Any new allergies since your discharge? No   Dietary orders reviewed? Yes  Do you have support at home? Yes   Functional Questionnaire: (I = Independent and D = Dependent) ADLs: I  Bathing/Dressing- I  Meal Prep- I  Eating- I  Maintaining continence- I  Transferring/Ambulation- I  Managing Meds- I  Follow up appointments reviewed:   PCP Hospital f/u appt confirmed? No  Provider has no 30 minute slots available within next 2 weeks. Sonny Masters that someone from the office will call her back to schedule appointment. Please notify front staff  Specialist Hospital f/u appt confirmed? N/A   Are transportation arrangements needed? No   If their condition worsens, is the pt aware to call PCP or go to the Emergency Dept.? Yes  Was the patient provided with contact information for the PCP's office or ED? Yes  Was to pt encouraged to call back with questions or concerns? Yes

## 2019-07-08 NOTE — Telephone Encounter (Signed)
Called patient and got him scheduled for Hospital follow up.

## 2019-07-14 ENCOUNTER — Other Ambulatory Visit: Payer: Self-pay

## 2019-07-14 ENCOUNTER — Ambulatory Visit (INDEPENDENT_AMBULATORY_CARE_PROVIDER_SITE_OTHER): Payer: Medicare Other | Admitting: Family Medicine

## 2019-07-14 ENCOUNTER — Encounter: Payer: Self-pay | Admitting: Family Medicine

## 2019-07-14 VITALS — BP 120/78 | HR 60 | Temp 97.9°F | Ht 63.0 in | Wt 179.1 lb

## 2019-07-14 DIAGNOSIS — I480 Paroxysmal atrial fibrillation: Secondary | ICD-10-CM

## 2019-07-14 DIAGNOSIS — E782 Mixed hyperlipidemia: Secondary | ICD-10-CM

## 2019-07-14 DIAGNOSIS — I6930 Unspecified sequelae of cerebral infarction: Secondary | ICD-10-CM

## 2019-07-14 DIAGNOSIS — R4189 Other symptoms and signs involving cognitive functions and awareness: Secondary | ICD-10-CM

## 2019-07-14 DIAGNOSIS — R739 Hyperglycemia, unspecified: Secondary | ICD-10-CM

## 2019-07-14 DIAGNOSIS — G40909 Epilepsy, unspecified, not intractable, without status epilepticus: Secondary | ICD-10-CM | POA: Diagnosis not present

## 2019-07-14 DIAGNOSIS — I10 Essential (primary) hypertension: Secondary | ICD-10-CM

## 2019-07-14 LAB — LIPID PANEL
Cholesterol: 143 mg/dL (ref 0–200)
HDL: 35.9 mg/dL — ABNORMAL LOW (ref >39.00)
LDL Cholesterol: 78 mg/dL (ref 0–99)
NonHDL: 107.18
Total CHOL/HDL Ratio: 4
Triglycerides: 148 mg/dL (ref 0.0–149.0)
VLDL: 29.6 mg/dL (ref 0.0–40.0)

## 2019-07-14 LAB — HEMOGLOBIN A1C: Hgb A1c MFr Bld: 5.6 % (ref 4.6–6.5)

## 2019-07-14 NOTE — Patient Instructions (Addendum)
Labs today.  Back off Dr Reino Kent, weight is up since hospitalization. Increase water intake instead. Make sure to get plenty of fruits and vegetables.  Continue current medicines.  Return in 3-4 months for physical.  We will sign you up for cologuard.

## 2019-07-14 NOTE — Assessment & Plan Note (Signed)
Chronic, stable. Continue current regimen. 

## 2019-07-14 NOTE — Assessment & Plan Note (Addendum)
Sounds regular today. Continue aspirin, beta blocker. Not anticoagulation candidate.

## 2019-07-14 NOTE — Assessment & Plan Note (Signed)
Chronic, stable on atorvastatin. Update FLP.  The ASCVD Risk score Denman George DC Jr., et al., 2013) failed to calculate for the following reasons:   The patient has a prior MI or stroke diagnosis

## 2019-07-14 NOTE — Assessment & Plan Note (Signed)
Stable period with addition of depakote. Now on 3 AEDs. Has neuro f/u planned 08/2019

## 2019-07-14 NOTE — Assessment & Plan Note (Signed)
Chronic. Continue statin, aspirin, tight BP control. H/o residual R vision impairment.

## 2019-07-14 NOTE — Progress Notes (Signed)
This visit was conducted in person.  BP 120/78 (BP Location: Right Arm, Patient Position: Sitting, Cuff Size: Normal)   Pulse 60   Temp 97.9 F (36.6 C) (Temporal)   Ht 5\' 3"  (1.6 m)   Wt 179 lb 2 oz (81.3 kg)   SpO2 94%   BMI 31.73 kg/m    CC: hosp f/u visit  Subjective:    Patient ID: [name] Cruz, male    DOB: Apr 18, 1955, 64 y.o.   MRN: [id number]  HPI: [name] Cruz is a 64 y.o. male presenting on 07/14/2019 for Hospitalization Follow-up (Pt accompanied by mom, Dot- temp 97.5.)   Last seen in office 08/2018. He did have AMW earlier this month 06/2019.   Known seizure disorder, afib and h/o stroke, cognitive impairment and depression. Not anticoagulation candidate due to h/o ICH and prior microhemorrhages.   Recent hospitalization for seizure like episodes witnessed by sister. Treated en route with 5mg  Versed, noted post ictal and drowsy and aphasic afterwards. EEG at ER ruled out status epilepticus. Neurology evaluated patient, received long-term EEG monitoring without cause found. New AED therapy is vimpat 200mg  BID, keppra 1500mg  bid, depakote (new) 500mg  BID. Also planned continuing geodon and zoloft for cognitive impairment and depression. Chronic residual L hemiparesis post prior stroke.   Upcoming neurology appointment 08/2019  Overall feeling well without HA, fevers/chills, leg swelling, chest pain or dyspnea.   Mother requests cologuard for patient.   Overnight EEG with video IMPRESSION: This study is suggestive ofepileptogenicity in left anterior temporal region. There is alsocortical dysfunction in right hemisphere likely secondary to underlying structural abnormality, post ictal state. Additionally, there is evidence ofmoderatediffuse encephalopathy, nonspecific etiology.The excessive beta activity seen in the background is most likely due to the effect of benzodiazepine and is a benign EEG pattern. No seizures were seen throughout the recording.   Admit  date: 06/25/2019 Discharge date: 06/27/2019 TCM hospital f/u phone call completed 06/28/2019.  Outside of window for TCM.   Admitted From: Home Disposition: Home  Recommendations for Outpatient Follow-up:  1. Follow up with PCP in 1-2 weeks 2. Follow-up with your neurologist as scheduled Discharge Condition: Stable CODE STATUS: Full code Diet recommendation: Low-salt diet  Discharge Diagnoses:  Principal Problem:   Status epilepticus (HCC) Active Problems:   History of stroke with current residual effects   Cognitive impairment   HLD (hyperlipidemia)   HTN (hypertension)   Seizure disorder (HCC)     Relevant past medical, surgical, family and social history reviewed and updated as indicated. Interim medical history since our last visit reviewed. Allergies and medications reviewed and updated. Outpatient Medications Prior to Visit  Medication Sig Dispense Refill  . aspirin EC 81 MG tablet Take 1 tablet (81 mg total) by mouth daily.    atorvastatin (LIPITOR) 80 MG tablet TAKE 1 TABLET BY MOUTH EVERY DAY (Patient taking differently: Take 80 mg by mouth daily. ) 90 tablet 0  . carvedilol (COREG) 3.125 MG tablet TAKE 1 TABLET (3.125 MG TOTAL) BY MOUTH 2 (TWO) TIMES DAILY. 180 tablet 0  . Cyanocobalamin (B-12) 1000 MCG SUBL Place 1 tablet under the tongue daily. (Patient taking differently: Place 1,000 mcg under the tongue daily. ) 90 each 0  . divalproex (DEPAKOTE) 500 MG DR tablet Take 1 tablet (500 mg total) by mouth 2 (two) times daily. 60 tablet 2  . Lacosamide (VIMPAT) 100 MG TABS Take 2 tablets (200 mg total) by mouth in the morning and at bedtime. 200 mg 2  times a day.  Total 400 mg. 120 tablet 0  . levETIRAcetam (KEPPRA) 750 MG tablet Take 2 tablets (1,500 mg total) by mouth 2 (two) times daily. 120 tablet 6  . Multiple Vitamin (MULTIVITAMIN WITH MINERALS) TABS tablet Take 1 tablet by mouth daily. 90 tablet 0  . sertraline (ZOLOFT) 25 MG tablet TAKE 1 TABLET BY MOUTH EVERY  DAY (Patient taking differently: Take 25 mg by mouth daily. ) 90 tablet 1  . ziprasidone (GEODON) 40 MG capsule TAKE 1 CAPSULE (40 MG TOTAL) BY MOUTH AT BEDTIME. 90 capsule 1   No facility-administered medications prior to visit.     Per HPI unless specifically indicated in ROS section below Review of Systems Objective:  BP 120/78 (BP Location: Right Arm, Patient Position: Sitting, Cuff Size: Normal)   Pulse 60   Temp 97.9 F (36.6 C) (Temporal)   Ht 5\' 3"  (1.6 m)   Wt 179 lb 2 oz (81.3 kg)   SpO2 94%   BMI 31.73 kg/m   Wt Readings from Last 3 Encounters:  07/14/19 179 lb 2 oz (81.3 kg)  06/25/19 167 lb 8.8 oz (76 kg)  10/22/18 167 lb 9.6 oz (76 kg)      Physical Exam Vitals and nursing note reviewed.  Constitutional:      Appearance: Normal appearance. He is not ill-appearing.  Eyes:     Extraocular Movements: Extraocular movements intact.     Pupils: Pupils are equal, round, and reactive to light.  Cardiovascular:     Rate and Rhythm: Normal rate and regular rhythm.     Pulses: Normal pulses.     Heart sounds: Normal heart sounds. No murmur heard.   Pulmonary:     Effort: Pulmonary effort is normal. No respiratory distress.     Breath sounds: Normal breath sounds. No wheezing, rhonchi or rales.  Musculoskeletal:     Right lower leg: No edema.     Left lower leg: No edema.  Skin:    General: Skin is warm and dry.     Findings: No rash.  Neurological:     Mental Status: He is alert.     Comments:  5/5 strength BUE, BLE Grip strength intact Carries RUE in slight flexion  Psychiatric:        Mood and Affect: Mood normal.        Behavior: Behavior normal.       Results for orders placed or performed during the hospital encounter of 06/25/19  SARS Coronavirus 2 by RT PCR (hospital order, performed in [facility] hospital lab) Nasopharyngeal Nasopharyngeal Swab   Specimen: Nasopharyngeal Swab  Result Value Ref Range   SARS Coronavirus 2 NEGATIVE NEGATIVE    Basic metabolic panel  Result Value Ref Range   Sodium 137 135 - 145 mmol/L   Potassium 3.6 3.5 - 5.1 mmol/L   Chloride 104 98 - 111 mmol/L   CO2 19 (L) 22 - 32 mmol/L   Glucose, Bld 175 (H) 70 - 99 mg/dL   BUN 5 (L) 8 - 23 mg/dL   Creatinine, Ser 1.611.11 0.61 - 1.24 mg/dL   Calcium 8.8 (L) 8.9 - 10.3 mg/dL   GFR calc non Af Amer >60 >60 mL/min   GFR calc Af Amer >60 >60 mL/min   Anion gap 14 5 - 15  CBC WITH DIFFERENTIAL  Result Value Ref Range   WBC 6.2 4.0 - 10.5 K/uL   RBC 4.39 4.22 - 5.81 MIL/uL   Hemoglobin 13.7 13.0 -  17.0 g/dL   HCT 20.3 39 - 52 %   MCV 95.2 80.0 - 100.0 fL   MCH 31.2 26.0 - 34.0 pg   MCHC 32.8 30.0 - 36.0 g/dL   RDW 55.9 74.1 - 63.8 %   Platelets 270 150 - 400 K/uL   nRBC 0.0 0.0 - 0.2 %   Neutrophils Relative % 71 %   Neutro Abs 4.4 1.7 - 7.7 K/uL   Lymphocytes Relative 18 %   Lymphs Abs 1.1 0.7 - 4.0 K/uL   Monocytes Relative 7 %   Monocytes Absolute 0.4 0 - 1 K/uL   Eosinophils Relative 2 %   Eosinophils Absolute 0.1 0 - 0 K/uL   Basophils Relative 1 %   Basophils Absolute 0.0 0 - 0 K/uL   Immature Granulocytes 1 %   Abs Immature Granulocytes 0.08 (H) 0.00 - 0.07 K/uL  Ethanol/ETOH  Result Value Ref Range   Alcohol, Ethyl (B) <10 <10 mg/dL  Urine rapid drug screen (hosp performed)  Result Value Ref Range   Opiates NONE DETECTED NONE DETECTED   Cocaine NONE DETECTED NONE DETECTED   Benzodiazepines POSITIVE (A) NONE DETECTED   Amphetamines NONE DETECTED NONE DETECTED   Tetrahydrocannabinol NONE DETECTED NONE DETECTED   Barbiturates NONE DETECTED NONE DETECTED  Urinalysis, Routine w reflex microscopic  Result Value Ref Range   Color, Urine STRAW (A) YELLOW   APPearance CLEAR CLEAR   Specific Gravity, Urine 1.003 (L) 1.005 - 1.030   pH 6.0 5.0 - 8.0   Glucose, UA NEGATIVE NEGATIVE mg/dL   Hgb urine dipstick NEGATIVE NEGATIVE   Bilirubin Urine NEGATIVE NEGATIVE   Ketones, ur NEGATIVE NEGATIVE mg/dL   Protein, ur NEGATIVE NEGATIVE mg/dL    Nitrite NEGATIVE NEGATIVE   Leukocytes,Ua NEGATIVE NEGATIVE  HIV Antibody (routine testing w rflx)  Result Value Ref Range   HIV Screen 4th Generation wRfx Non Reactive Non Reactive  Comprehensive metabolic panel  Result Value Ref Range   Sodium 140 135 - 145 mmol/L   Potassium 3.5 3.5 - 5.1 mmol/L   Chloride 108 98 - 111 mmol/L   CO2 19 (L) 22 - 32 mmol/L   Glucose, Bld 102 (H) 70 - 99 mg/dL   BUN <5 (L) 8 - 23 mg/dL   Creatinine, Ser 4.53 0.61 - 1.24 mg/dL   Calcium 8.2 (L) 8.9 - 10.3 mg/dL   Total Protein 6.0 (L) 6.5 - 8.1 g/dL   Albumin 3.6 3.5 - 5.0 g/dL   AST 24 15 - 41 U/L   ALT 20 0 - 44 U/L   Alkaline Phosphatase 46 38 - 126 U/L   Total Bilirubin 0.6 0.3 - 1.2 mg/dL   GFR calc non Af Amer >60 >60 mL/min   GFR calc Af Amer >60 >60 mL/min   Anion gap 13 5 - 15  CBC  Result Value Ref Range   WBC 12.1 (H) 4.0 - 10.5 K/uL   RBC 4.06 (L) 4.22 - 5.81 MIL/uL   Hemoglobin 12.6 (L) 13.0 - 17.0 g/dL   HCT 64.6 (L) 39 - 52 %   MCV 93.6 80.0 - 100.0 fL   MCH 31.0 26.0 - 34.0 pg   MCHC 33.2 30.0 - 36.0 g/dL   RDW 80.3 21.2 - 24.8 %   Platelets 212 150 - 400 K/uL   nRBC 0.0 0.0 - 0.2 %  Glucose, capillary  Result Value Ref Range   Glucose-Capillary 106 (H) 70 - 99 mg/dL  Glucose, capillary  Result Value  Ref Range   Glucose-Capillary 116 (H) 70 - 99 mg/dL  Glucose, capillary  Result Value Ref Range   Glucose-Capillary 100 (H) 70 - 99 mg/dL  Glucose, capillary  Result Value Ref Range   Glucose-Capillary 129 (H) 70 - 99 mg/dL  CBG monitoring, ED  Result Value Ref Range   Glucose-Capillary 163 (H) 70 - 99 mg/dL   Comment 1 Notify RN    Comment 2 Document in Chart    Lab Results  Component Value Date   HGBA1C 5.6 07/09/2018    Lab Results  Component Value Date   CHOL 120 07/09/2018   HDL 35 (L) 07/09/2018   LDLCALC 69 07/09/2018   LDLDIRECT 122.0 07/22/2016   TRIG 78 07/09/2018   CHOLHDL 3.4 07/09/2018    Assessment & Plan:  This visit occurred during the  SARS-CoV-2 public health emergency.  Safety protocols were in place, including screening questions prior to the visit, additional usage of staff PPE, and extensive cleaning of exam room while observing appropriate contact time as indicated for disinfecting solutions.   Problem List Items Addressed This Visit    Seizure disorder (HCC) - Primary    Stable period with addition of depakote. Now on 3 AEDs. Has neuro f/u planned 08/2019       HTN (hypertension)    Chronic ,stable. Continue current regimen.       HLD (hyperlipidemia)    Chronic, stable on atorvastatin. Update FLP.  The ASCVD Risk score Denman George DC Jr., et al., 2013) failed to calculate for the following reasons:   The patient has a prior MI or stroke diagnosis       Relevant Orders   Lipid panel   History of stroke with current residual effects    Chronic. Continue statin, aspirin, tight BP control. H/o residual R vision impairment.       Cognitive impairment   Atrial fibrillation (HCC)    Sounds regular today. Continue aspirin, beta blocker. Not anticoagulation candidate.        Other Visit Diagnoses    Hyperglycemia       Relevant Orders   Hemoglobin A1c       No orders of the defined types were placed in this encounter.  Orders Placed This Encounter  Procedures  . Lipid panel  . Hemoglobin A1c    Patient Instructions  Labs today.  Back off Dr Reino Kent, weight is up since hospitalization. Increase water intake instead. Make sure to get plenty of fruits and vegetables.  Continue current medicines.  Return in 3-4 months for physical.  We will sign you up for cologuard.    Follow up plan: Return in about 4 months (around 11/13/2019), or if symptoms worsen or fail to improve, for annual exam, prior fasting for blood work.  [name], MD

## 2019-07-19 ENCOUNTER — Inpatient Hospital Stay (HOSPITAL_COMMUNITY)
Admission: EM | Admit: 2019-07-19 | Discharge: 2019-07-26 | DRG: 065 | Disposition: A | Discharge status: Rehabilitation Facility | Payer: Medicare Other | Attending: Internal Medicine | Admitting: Internal Medicine

## 2019-07-19 ENCOUNTER — Emergency Department (HOSPITAL_COMMUNITY): Payer: Medicare Other

## 2019-07-19 ENCOUNTER — Encounter (HOSPITAL_COMMUNITY): Payer: Self-pay | Admitting: Neurology

## 2019-07-19 DIAGNOSIS — R569 Unspecified convulsions: Secondary | ICD-10-CM | POA: Diagnosis not present

## 2019-07-19 DIAGNOSIS — B957 Other staphylococcus as the cause of diseases classified elsewhere: Secondary | ICD-10-CM | POA: Diagnosis not present

## 2019-07-19 DIAGNOSIS — R471 Dysarthria and anarthria: Secondary | ICD-10-CM | POA: Diagnosis not present

## 2019-07-19 DIAGNOSIS — I161 Hypertensive emergency: Secondary | ICD-10-CM | POA: Diagnosis not present

## 2019-07-19 DIAGNOSIS — G40909 Epilepsy, unspecified, not intractable, without status epilepticus: Secondary | ICD-10-CM | POA: Diagnosis present

## 2019-07-19 DIAGNOSIS — R509 Fever, unspecified: Secondary | ICD-10-CM | POA: Diagnosis not present

## 2019-07-19 DIAGNOSIS — I69022 Dysarthria following nontraumatic subarachnoid hemorrhage: Secondary | ICD-10-CM | POA: Diagnosis not present

## 2019-07-19 DIAGNOSIS — I252 Old myocardial infarction: Secondary | ICD-10-CM

## 2019-07-19 DIAGNOSIS — Z6833 Body mass index (BMI) 33.0-33.9, adult: Secondary | ICD-10-CM | POA: Diagnosis not present

## 2019-07-19 DIAGNOSIS — Z951 Presence of aortocoronary bypass graft: Secondary | ICD-10-CM

## 2019-07-19 DIAGNOSIS — Z79899 Other long term (current) drug therapy: Secondary | ICD-10-CM

## 2019-07-19 DIAGNOSIS — I69354 Hemiplegia and hemiparesis following cerebral infarction affecting left non-dominant side: Secondary | ICD-10-CM

## 2019-07-19 DIAGNOSIS — R29810 Facial weakness: Secondary | ICD-10-CM | POA: Diagnosis not present

## 2019-07-19 DIAGNOSIS — Z20822 Contact with and (suspected) exposure to covid-19: Secondary | ICD-10-CM | POA: Diagnosis present

## 2019-07-19 DIAGNOSIS — I10 Essential (primary) hypertension: Secondary | ICD-10-CM | POA: Diagnosis not present

## 2019-07-19 DIAGNOSIS — I609 Nontraumatic subarachnoid hemorrhage, unspecified: Secondary | ICD-10-CM | POA: Diagnosis not present

## 2019-07-19 DIAGNOSIS — N319 Neuromuscular dysfunction of bladder, unspecified: Secondary | ICD-10-CM | POA: Diagnosis not present

## 2019-07-19 DIAGNOSIS — I610 Nontraumatic intracerebral hemorrhage in hemisphere, subcortical: Secondary | ICD-10-CM

## 2019-07-19 DIAGNOSIS — I2510 Atherosclerotic heart disease of native coronary artery without angina pectoris: Secondary | ICD-10-CM | POA: Diagnosis not present

## 2019-07-19 DIAGNOSIS — I615 Nontraumatic intracerebral hemorrhage, intraventricular: Principal | ICD-10-CM | POA: Diagnosis present

## 2019-07-19 DIAGNOSIS — R6510 Systemic inflammatory response syndrome (SIRS) of non-infectious origin without acute organ dysfunction: Secondary | ICD-10-CM | POA: Diagnosis not present

## 2019-07-19 DIAGNOSIS — R531 Weakness: Secondary | ICD-10-CM | POA: Diagnosis not present

## 2019-07-19 DIAGNOSIS — Z888 Allergy status to other drugs, medicaments and biological substances status: Secondary | ICD-10-CM

## 2019-07-19 DIAGNOSIS — E669 Obesity, unspecified: Secondary | ICD-10-CM | POA: Diagnosis present

## 2019-07-19 DIAGNOSIS — Z86718 Personal history of other venous thrombosis and embolism: Secondary | ICD-10-CM | POA: Diagnosis not present

## 2019-07-19 DIAGNOSIS — R0602 Shortness of breath: Secondary | ICD-10-CM | POA: Diagnosis not present

## 2019-07-19 DIAGNOSIS — Z87891 Personal history of nicotine dependence: Secondary | ICD-10-CM | POA: Diagnosis not present

## 2019-07-19 DIAGNOSIS — R6889 Other general symptoms and signs: Secondary | ICD-10-CM | POA: Diagnosis not present

## 2019-07-19 DIAGNOSIS — I618 Other nontraumatic intracerebral hemorrhage: Secondary | ICD-10-CM | POA: Diagnosis not present

## 2019-07-19 DIAGNOSIS — I619 Nontraumatic intracerebral hemorrhage, unspecified: Secondary | ICD-10-CM | POA: Diagnosis not present

## 2019-07-19 DIAGNOSIS — Z7982 Long term (current) use of aspirin: Secondary | ICD-10-CM

## 2019-07-19 DIAGNOSIS — I739 Peripheral vascular disease, unspecified: Secondary | ICD-10-CM | POA: Diagnosis not present

## 2019-07-19 DIAGNOSIS — Z8673 Personal history of transient ischemic attack (TIA), and cerebral infarction without residual deficits: Secondary | ICD-10-CM | POA: Diagnosis not present

## 2019-07-19 DIAGNOSIS — Z03818 Encounter for observation for suspected exposure to other biological agents ruled out: Secondary | ICD-10-CM | POA: Diagnosis not present

## 2019-07-19 DIAGNOSIS — I69391 Dysphagia following cerebral infarction: Secondary | ICD-10-CM | POA: Diagnosis not present

## 2019-07-19 DIAGNOSIS — I480 Paroxysmal atrial fibrillation: Secondary | ICD-10-CM | POA: Diagnosis present

## 2019-07-19 DIAGNOSIS — E785 Hyperlipidemia, unspecified: Secondary | ICD-10-CM | POA: Diagnosis not present

## 2019-07-19 DIAGNOSIS — B370 Candidal stomatitis: Secondary | ICD-10-CM | POA: Diagnosis not present

## 2019-07-19 DIAGNOSIS — I639 Cerebral infarction, unspecified: Secondary | ICD-10-CM | POA: Diagnosis not present

## 2019-07-19 DIAGNOSIS — F319 Bipolar disorder, unspecified: Secondary | ICD-10-CM | POA: Diagnosis present

## 2019-07-19 DIAGNOSIS — I69191 Dysphagia following nontraumatic intracerebral hemorrhage: Secondary | ICD-10-CM | POA: Diagnosis not present

## 2019-07-19 DIAGNOSIS — I69054 Hemiplegia and hemiparesis following nontraumatic subarachnoid hemorrhage affecting left non-dominant side: Secondary | ICD-10-CM | POA: Diagnosis not present

## 2019-07-19 DIAGNOSIS — N390 Urinary tract infection, site not specified: Secondary | ICD-10-CM | POA: Diagnosis not present

## 2019-07-19 DIAGNOSIS — I69218 Other symptoms and signs involving cognitive functions following other nontraumatic intracranial hemorrhage: Secondary | ICD-10-CM | POA: Diagnosis not present

## 2019-07-19 DIAGNOSIS — D696 Thrombocytopenia, unspecified: Secondary | ICD-10-CM | POA: Diagnosis not present

## 2019-07-19 DIAGNOSIS — R29711 NIHSS score 11: Secondary | ICD-10-CM | POA: Diagnosis not present

## 2019-07-19 DIAGNOSIS — Z743 Need for continuous supervision: Secondary | ICD-10-CM | POA: Diagnosis not present

## 2019-07-19 DIAGNOSIS — A499 Bacterial infection, unspecified: Secondary | ICD-10-CM | POA: Diagnosis not present

## 2019-07-19 DIAGNOSIS — E871 Hypo-osmolality and hyponatremia: Secondary | ICD-10-CM | POA: Diagnosis not present

## 2019-07-19 DIAGNOSIS — R1310 Dysphagia, unspecified: Secondary | ICD-10-CM | POA: Diagnosis not present

## 2019-07-19 DIAGNOSIS — R0989 Other specified symptoms and signs involving the circulatory and respiratory systems: Secondary | ICD-10-CM | POA: Diagnosis not present

## 2019-07-19 DIAGNOSIS — I629 Nontraumatic intracranial hemorrhage, unspecified: Secondary | ICD-10-CM | POA: Diagnosis not present

## 2019-07-19 DIAGNOSIS — I6389 Other cerebral infarction: Secondary | ICD-10-CM | POA: Diagnosis not present

## 2019-07-19 LAB — CBC
HCT: 37.4 % — ABNORMAL LOW (ref 39.0–52.0)
Hemoglobin: 12.2 g/dL — ABNORMAL LOW (ref 13.0–17.0)
MCH: 30.9 pg (ref 26.0–34.0)
MCHC: 32.6 g/dL (ref 30.0–36.0)
MCV: 94.7 fL (ref 80.0–100.0)
Platelets: 228 10*3/uL (ref 150–400)
RBC: 3.95 MIL/uL — ABNORMAL LOW (ref 4.22–5.81)
RDW: 12.1 % (ref 11.5–15.5)
WBC: 7.2 10*3/uL (ref 4.0–10.5)
nRBC: 0 % (ref 0.0–0.2)

## 2019-07-19 LAB — MRSA PCR SCREENING: MRSA by PCR: NEGATIVE

## 2019-07-19 LAB — DIFFERENTIAL
Abs Immature Granulocytes: 0.05 10*3/uL (ref 0.00–0.07)
Basophils Absolute: 0.1 10*3/uL (ref 0.0–0.1)
Basophils Relative: 1 %
Eosinophils Absolute: 0.1 10*3/uL (ref 0.0–0.5)
Eosinophils Relative: 2 %
Immature Granulocytes: 1 %
Lymphocytes Relative: 27 %
Lymphs Abs: 1.9 10*3/uL (ref 0.7–4.0)
Monocytes Absolute: 0.9 10*3/uL (ref 0.1–1.0)
Monocytes Relative: 12 %
Neutro Abs: 4.2 10*3/uL (ref 1.7–7.7)
Neutrophils Relative %: 57 %

## 2019-07-19 LAB — I-STAT CHEM 8, ED
BUN: 4 mg/dL — ABNORMAL LOW (ref 8–23)
Calcium, Ion: 1.05 mmol/L — ABNORMAL LOW (ref 1.15–1.40)
Chloride: 98 mmol/L (ref 98–111)
Creatinine, Ser: 0.7 mg/dL (ref 0.61–1.24)
Glucose, Bld: 98 mg/dL (ref 70–99)
HCT: 36 % — ABNORMAL LOW (ref 39.0–52.0)
Hemoglobin: 12.2 g/dL — ABNORMAL LOW (ref 13.0–17.0)
Potassium: 3.8 mmol/L (ref 3.5–5.1)
Sodium: 135 mmol/L (ref 135–145)
TCO2: 23 mmol/L (ref 22–32)

## 2019-07-19 LAB — COMPREHENSIVE METABOLIC PANEL
ALT: 14 U/L (ref 0–44)
AST: 17 U/L (ref 15–41)
Albumin: 3.6 g/dL (ref 3.5–5.0)
Alkaline Phosphatase: 42 U/L (ref 38–126)
Anion gap: 9 (ref 5–15)
BUN: <5 mg/dL — ABNORMAL LOW (ref 8–23)
CO2: 24 mmol/L (ref 22–32)
Calcium: 8.3 mg/dL — ABNORMAL LOW (ref 8.9–10.3)
Chloride: 99 mmol/L (ref 98–111)
Creatinine, Ser: 0.71 mg/dL (ref 0.61–1.24)
GFR calc Af Amer: >60 mL/min (ref >60)
GFR calc non Af Amer: >60 mL/min (ref >60)
Glucose, Bld: 101 mg/dL — ABNORMAL HIGH (ref 70–99)
Potassium: 3.8 mmol/L (ref 3.5–5.1)
Sodium: 132 mmol/L — ABNORMAL LOW (ref 135–145)
Total Bilirubin: 0.5 mg/dL (ref 0.3–1.2)
Total Protein: 6 g/dL — ABNORMAL LOW (ref 6.5–8.1)

## 2019-07-19 LAB — SARS CORONAVIRUS 2 BY RT PCR (HOSPITAL ORDER, PERFORMED IN ~~LOC~~ HOSPITAL LAB): SARS Coronavirus 2: NEGATIVE

## 2019-07-19 LAB — PROTIME-INR
INR: 1.1 (ref 0.8–1.2)
Prothrombin Time: 13.4 seconds (ref 11.4–15.2)

## 2019-07-19 LAB — APTT: aPTT: 33 seconds (ref 24–36)

## 2019-07-19 LAB — CBG MONITORING, ED: Glucose-Capillary: 96 mg/dL (ref 70–99)

## 2019-07-19 LAB — ETHANOL: Alcohol, Ethyl (B): <10 mg/dL (ref <10)

## 2019-07-19 MED ORDER — ACETAMINOPHEN 650 MG RE SUPP: 650.0000 mg | RECTAL | Status: DC | PRN

## 2019-07-19 MED ORDER — CLEVIDIPINE BUTYRATE 0.5 MG/ML IV EMUL: 0.0000 mg/h | INTRAVENOUS | Status: DC

## 2019-07-19 MED ORDER — CLEVIDIPINE BUTYRATE 0.5 MG/ML IV EMUL
0.0000 mg/h | INTRAVENOUS | Status: DC
  Administered 2019-07-19 (×2): 1 mg/h via INTRAVENOUS
  Filled 2019-07-19 (×2): qty 50

## 2019-07-19 MED ORDER — SENNOSIDES-DOCUSATE SODIUM 8.6-50 MG PO TABS
1.0000 | ORAL_TABLET | Freq: Two times a day (BID) | ORAL | Status: DC
  Administered 2019-07-20 – 2019-07-26 (×9): 1 via ORAL
  Filled 2019-07-19 (×12): qty 1

## 2019-07-19 MED ORDER — ACETAMINOPHEN 325 MG PO TABS
650.0000 mg | ORAL_TABLET | ORAL | Status: DC | PRN
  Administered 2019-07-23 – 2019-07-24 (×2): 650 mg via ORAL
  Filled 2019-07-19 (×2): qty 2

## 2019-07-19 MED ORDER — CHLORHEXIDINE GLUCONATE CLOTH 2 % EX PADS
6.0000 | MEDICATED_PAD | Freq: Every day | CUTANEOUS | Status: DC
  Administered 2019-07-19 – 2019-07-26 (×4): 6 via TOPICAL

## 2019-07-19 MED ORDER — ORAL CARE MOUTH RINSE
15.0000 mL | Freq: Two times a day (BID) | OROMUCOSAL | Status: DC
  Administered 2019-07-20 – 2019-07-26 (×13): 15 mL via OROMUCOSAL

## 2019-07-19 MED ORDER — STROKE: EARLY STAGES OF RECOVERY BOOK: Freq: Once | Status: DC

## 2019-07-19 MED ORDER — PANTOPRAZOLE SODIUM 40 MG IV SOLR
40.0000 mg | Freq: Every day | INTRAVENOUS | Status: DC
  Administered 2019-07-19 – 2019-07-20 (×2): 40 mg via INTRAVENOUS
  Filled 2019-07-19 (×2): qty 40

## 2019-07-19 MED ORDER — LABETALOL HCL 5 MG/ML IV SOLN: 10.0000 mg | Freq: Once | INTRAVENOUS | Status: AC

## 2019-07-19 MED ORDER — LABETALOL HCL 5 MG/ML IV SOLN
INTRAVENOUS | Status: AC
  Administered 2019-07-19: 10 mg via INTRAVENOUS
  Filled 2019-07-19: qty 4

## 2019-07-19 MED ORDER — ACETAMINOPHEN 160 MG/5ML PO SOLN: 650.0000 mg | ORAL | Status: DC | PRN

## 2019-07-19 NOTE — ED Triage Notes (Addendum)
Pt presents to ED BIB [facility]. Pt presents as CODE STROKE. Pt c/o sudden onset L arm and leg weakness, L facial droop, and slurred speech. Pt reports unwitnessed fall before s/s onset. Initial [facility] - 11. LN[facility] - 1645

## 2019-07-19 NOTE — ED Provider Notes (Signed)
Emergency Department Provider Note   I have reviewed the triage vital signs and the nursing notes.   HISTORY  Chief Complaint Code Stroke   HPI [name] Cruz is a 64 y.o. male with PMH reviewed below including prior CVA and seizure presents to the ED as a CODE STROKE.  Patient was found down in his yard while cutting his grass by bystanders.  The fall was not witnessed.  No witnessed seizure activity.  Last seen normal at 16:45. Patient denies any active pain.   Level 5 caveat: Acute stroke and dysarthria.    Past Medical History:  Diagnosis Date  . Acute deep vein thrombosis (DVT) of left upper extremity (HCC)    L brachial and basilic veins, eliquis started 08/22/2017 to continue for 3 months total   . Cognitive impairment 2007   after stroke, saw rehab but told to stop because was too upsetting to him  . History of chicken pox   . HLD (hyperlipidemia)   . HTN (hypertension)   . NSTEMI (non-ST elevated myocardial infarction) (HCC) 02/21/2017  . Obesity   . Stroke, hemorrhagic (HCC) 2007   thought 2/2 HTN (240sbp); residual cognitive impairment, loss of R peripheral field, no driving    Patient Active Problem List   Diagnosis Date Noted  . ICH (intracerebral hemorrhage) (HCC) 07/19/2019  . History of DVT in adulthood 02/21/2019  . PAD (peripheral artery disease) (HCC) 12/14/2018  . Agitation 09/04/2018  . Acute CVA (cerebrovascular accident) (HCC) 08/07/2018  . Cerebellar stroke, acute (HCC) 07/10/2018  . Status epilepticus (HCC) 03/25/2018  . Unilateral occipital headache 10/03/2017  . Ischemic stroke (HCC) 08/23/2017  . Coronary artery disease involving coronary bypass graft of native heart without angina pectoris   . Atrial fibrillation (HCC)   . Dysphagia, post-stroke   . Cardiac arrest (HCC) 08/09/2017  . S/P CABG x 3 02/24/2017  . NSTEMI (non-ST elevated myocardial infarction) (HCC) 02/21/2017  . Encephalomalacia 10/28/2012  . Seizure disorder (HCC)  04/26/2012  . Alcohol abuse 04/26/2012  . History of stroke with current residual effects   . Cognitive impairment   . HLD (hyperlipidemia)   . HTN (hypertension)     Past Surgical History:  Procedure Laterality Date  . ANKLE SURGERY  1990s   right foot with plate and screws  . CORONARY ARTERY BYPASS GRAFT N/A 02/24/2017   3v Procedure: CORONARY ARTERY BYPASS GRAFTING (CABG) x 3 ON PUMP USING LEFT INTERNAL MAMMARY ARTERY TO LEFT ANTERIOR DESENDING CORNARY ARTERY, RIGHT GREATER SAPHENOUS VEIN TO LEFT CIRCUMFLEX ARTERY AND POSTERIOR DESENDING ARTERY. RIGHT GREATER SAPHENOUS VEIN OBTAINED VIA ENDOVEIN HARVEST.;  Surgeon: Delight Ovens, MD  . IABP INSERTION N/A 02/21/2017   Procedure: IABP Insertion;  Surgeon: Yvonne Kendall, MD;  Location: [facility];  Service: Cardiovascular;  Laterality: N/A;  . LEFT HEART CATH AND CORONARY ANGIOGRAPHY N/A 02/21/2017   Procedure: LEFT HEART CATH AND CORONARY ANGIOGRAPHY;  Surgeon: Yvonne Kendall, MD;  Location: [facility];  Service: Cardiovascular;  Laterality: N/A;  . TEE WITHOUT CARDIOVERSION N/A 02/24/2017   Procedure: TRANSESOPHAGEAL ECHOCARDIOGRAM (TEE);  Surgeon: Delight Ovens, MD;  Location: [facility] OR;  Service: Open Heart Surgery;  Laterality: N/A;    Allergies Losartan  Family History  Problem Relation Age of Onset  . Alzheimer's disease Maternal Grandfather   . Cancer Mother        lymphoma  . Alcohol abuse Father        smoker  . Coronary artery  disease Neg Hx   . Stroke Neg Hx   . Diabetes Neg Hx     Social History Social History   Tobacco Use  . Smoking status: Never Smoker  . Smokeless tobacco: Former Clinical biochemist  . Vaping Use: Never used  Substance Use Topics  . Alcohol use: Not Currently  . Drug use: No    Review of Systems  Level 5 caveat: Stroke symptoms - dysarthria  ____________________________________________  PHYSICAL EXAM:  VITAL SIGNS:  Vitals:   07/19/19 2200 07/19/19 2215    BP: 119/69 128/76  Pulse: 60 66  Resp: 10 12  Temp:    SpO2: 98% 98%    Constitutional: Alert and oriented. Right gaze preference and dysarthria.  Eyes: Conjunctivae are normal.  Head: Atraumatic. Nose: No congestion/rhinnorhea. Mouth/Throat: Mucous membranes are moist.  Neck: No stridor.  Cardiovascular: Normal rate, regular rhythm. Good peripheral circulation. Grossly normal heart sounds.   Respiratory: Normal respiratory effort.  No retractions. Lungs CTAB. Gastrointestinal: Soft and nontender. No distention.  Musculoskeletal: No lower extremity tenderness nor edema. No gross deformities of extremities. Neurologic: Dysarthria with right gaze preference. Face droop and left side weakness. NIH 11.  Skin:  Skin is warm, dry and intact. No rash noted.  ____________________________________________   LABS (all labs ordered are listed, but only abnormal results are displayed)  Labs Reviewed  CBC - Abnormal; Notable for the following components:      Result Value   RBC 3.95 (*)    Hemoglobin 12.2 (*)    HCT 37.4 (*)    All other components within normal limits  COMPREHENSIVE METABOLIC PANEL - Abnormal; Notable for the following components:   Sodium 132 (*)    Glucose, Bld 101 (*)    BUN <5 (*)    Calcium 8.3 (*)    Total Protein 6.0 (*)    All other components within normal limits  I-STAT CHEM 8, ED - Abnormal; Notable for the following components:   BUN 4 (*)    Calcium, Ion 1.05 (*)    Hemoglobin 12.2 (*)    HCT 36.0 (*)    All other components within normal limits  SARS CORONAVIRUS 2 BY RT PCR (HOSPITAL ORDER, PERFORMED IN [facility])  MRSA PCR SCREENING  ETHANOL  PROTIME-INR  APTT  DIFFERENTIAL  RAPID URINE DRUG SCREEN, HOSP PERFORMED  URINALYSIS, ROUTINE W REFLEX MICROSCOPIC  CBG MONITORING, ED   ____________________________________________  EKG   EKG Interpretation  Date/Time:  Monday July 19 2019 19:16:12 EDT Ventricular Rate:  70 PR  Interval:    QRS Duration: 85 QT Interval:  388 QTC Calculation: 419 R Axis:   -11 Text Interpretation: Sinus rhythm Borderline T wave abnormalities No STEMI Confirmed by Alona Bene [phone]) on 07/19/2019 7:39:26 PM       ____________________________________________  RADIOLOGY  CT HEAD CODE STROKE WO CONTRAST  Addendum Date: 07/19/2019   ADDENDUM REPORT: 07/19/2019 19:34 ADDENDUM: These results were communicated to Dr. Laurence Slate At 7:33 pmon 7/5/2021by text page via the [facility] messaging system. Electronically Signed   By: Jackey Loge DO   On: 07/19/2019 19:34   Result Date: 07/19/2019 CLINICAL DATA:  Code stroke. Facial droop, altered mental status. Additional history provided: Last known normal 1645. EXAM: CT HEAD WITHOUT CONTRAST TECHNIQUE: Contiguous axial images were obtained from the base of the skull through the vertex without intravenous contrast. COMPARISON:  Brain MRI 05/27/2019, noncontrast head CT 05/27/2019, CT angiogram head/neck 05/27/2019. FINDINGS: Brain: Acute parenchymal  hemorrhage centered within the right thalamocapsular junction measuring 1.4 x 2.0 x 2.6 cm (series 3, image 16) (series 5, image 36). Mild surrounding edema. No evidence of intraventricular extension at this time. Redemonstrated chronic infarct affecting the left temporoparietal lobes with associated chronic encephalomalacia and dystrophic calcification. Ex vacuo dilatation of the left lateral ventricle. Stable background mild generalized parenchymal atrophy and moderate chronic small vessel ischemic disease. No extra-axial fluid collection. No midline shift. Vascular: No hyperdense vessel.  Atherosclerotic calcifications. Skull: Normal. Negative for fracture or focal lesion. Sinuses/Orbits: Visualized orbits show no acute finding. Mild ethmoid and sphenoid sinus mucosal thickening. No significant mastoid effusion. IMPRESSION: 1. 1.4 x 2.0 x 2.6 cm acute parenchymal hemorrhage centered within the right thalamocapsular  junction, a characteristic location for hypertensive hemorrhage. Mild associated edema. No intraventricular extension of hemorrhage at this time. 2. Redemonstrated chronic left temporoparietal infarct. 3. Stable background mild generalized parenchymal atrophy and moderate chronic small vessel ischemic disease. 4. Mild paranasal sinus mucosal thickening. Electronically Signed: By: Jackey Loge DO On: 07/19/2019 19:20    ____________________________________________   PROCEDURES  Procedure(s) performed:   Procedures  CRITICAL CARE Performed by: Maia Plan Total critical care time: 35 minutes Critical care time was exclusive of separately billable procedures and treating other patients. Critical care was necessary to treat or prevent imminent or life-threatening deterioration. Critical care was time spent personally by me on the following activities: development of treatment plan with patient and/or surrogate as well as nursing, discussions with consultants, evaluation of patient's response to treatment, examination of patient, obtaining history from patient or surrogate, ordering and performing treatments and interventions, ordering and review of laboratory studies, ordering and review of radiographic studies, pulse oximetry and re-evaluation of patient's condition.  Alona Bene, MD Emergency Medicine  ____________________________________________   INITIAL IMPRESSION / ASSESSMENT AND PLAN / ED COURSE  Pertinent labs & imaging results that were available during my care of the patient were reviewed by me and considered in my medical decision making (see chart for details).   Patient presents emergency department as a code stroke by EMS.  Deficits noted on exam as above.  Airway clear for CT.  Reviewed CT imaging showing hemorrhagic stroke.  Patient's initial blood pressure is elevated and he was started on Cleviprex.  No anticoagulation to reverse.  Patient to be admitted by neurology.    Discussed patient's case with Neurology, Dr. Lyman Bishop to request admission. Patient and family (if present) updated with plan. Care transferred to Neurology service.  I reviewed all nursing notes, vitals, pertinent old records, EKGs, labs, imaging (as available).  ____________________________________________  FINAL CLINICAL IMPRESSION(S) / ED DIAGNOSES  Final diagnoses:  Hemorrhagic stroke (HCC)     MEDICATIONS GIVEN DURING THIS VISIT:  Medications  clevidipine (CLEVIPREX) infusion 0.5 mg/mL (2 mg/hr Intravenous Rate/Dose Verify 07/19/19 2200)   stroke: mapping our early stages of recovery book (0 each Does not apply Hold 07/19/19 2229)  acetaminophen (TYLENOL) tablet 650 mg (has no administration in time range)    Or  acetaminophen (TYLENOL) 160 MG/5ML solution 650 mg (has no administration in time range)    Or  acetaminophen (TYLENOL) suppository 650 mg (has no administration in time range)  senna-docusate (Senokot-S) tablet 1 tablet (1 tablet Oral Not Given 07/19/19 2110)  pantoprazole (PROTONIX) injection 40 mg (40 mg Intravenous Given 07/19/19 2113)  Chlorhexidine Gluconate Cloth 2 % PADS 6 each (6 each Topical Given 07/19/19 2110)  MEDLINE mouth rinse (has no administration in time range)  labetalol (NORMODYNE) injection 10 mg (10 mg Intravenous Given 07/19/19 1919)    Note:  This document was prepared using Dragon voice recognition software and may include unintentional dictation errors.  Alona BeneJoshua Geoffrey Hynes, MD, [facility] Emergency Medicine    Yuniel Blaney, Arlyss RepressJoshua G, MD 07/19/19 [phone]

## 2019-07-19 NOTE — ED Notes (Signed)
Family updated as to patient's status.

## 2019-07-19 NOTE — H&P (Signed)
Chief Complaint: Left side weakness  History obtained from: Patient and Chart   HPI:                                                                                                                                       [name] Cruz is a 64 y.o. male with past medical history of deep vein thrombosis previously on Eliquis, history of hemorrhagic stroke secondary to hypertension with blood pressure of 240 systolic, hypertension, hyperlipidemia presents to the emergency department with sudden onset left-sided weakness that began at 4:45 PM.  Last known normal 4:45 PM, patient states that he suddenly fell and called 911.  Brought to emergency department by EMS as code stroke.  Stat CT obtained which showed right thalamic hemorrhage.  Initial NIH stroke scale 11.  Blood pressure 145/118 with right BP cuff and 158 x 85 and left BP cuff.  Patient received labetalol and blood pressure remain high and so patient was started on Cleviprex drip.  Date last known well: 7.5.21 Time last known well: 4:45 PM tPA Given: No, hemorrhage NIHSS: 11 Baseline MRS 2  Intracerebral Hemorrhage (ICH) Score Glascow Coma Score  13-15 0 Age >/= 80 yes no 0 ICH volume >/= 30ml  no 0 IVH yes no 0 Infratentorial origin no 0 Total:  0   Past Medical History:  Diagnosis Date   Acute deep vein thrombosis (DVT) of left upper extremity (HCC)    L brachial and basilic veins, eliquis started 08/22/2017 to continue for 3 months total    Cognitive impairment 2007   after stroke, saw rehab but told to stop because was too upsetting to him   History of chicken pox    HLD (hyperlipidemia)    HTN (hypertension)    NSTEMI (non-ST elevated myocardial infarction) (HCC) 02/21/2017   Obesity    Stroke, hemorrhagic (HCC) 2007   thought 2/2 HTN (240sbp); residual cognitive impairment, loss of R peripheral field, no driving    Past Surgical History:  Procedure Laterality Date   ANKLE SURGERY  1990s   right foot with plate and  screws   CORONARY ARTERY BYPASS GRAFT N/A 02/24/2017   3v Procedure: CORONARY ARTERY BYPASS GRAFTING (CABG) x 3 ON PUMP USING LEFT INTERNAL MAMMARY ARTERY TO LEFT ANTERIOR DESENDING CORNARY ARTERY, RIGHT GREATER SAPHENOUS VEIN TO LEFT CIRCUMFLEX ARTERY AND POSTERIOR DESENDING ARTERY. RIGHT GREATER SAPHENOUS VEIN OBTAINED VIA ENDOVEIN HARVEST.;  Surgeon: Delight OvensGerhardt, Edward B, MD   IABP INSERTION N/A 02/21/2017   Procedure: IABP Insertion;  Surgeon: Yvonne KendallEnd, Christopher, MD;  Location: [facility];  Service: Cardiovascular;  Laterality: N/A;   LEFT HEART CATH AND CORONARY ANGIOGRAPHY N/A 02/21/2017   Procedure: LEFT HEART CATH AND CORONARY ANGIOGRAPHY;  Surgeon: Yvonne KendallEnd, Christopher, MD;  Location: [facility];  Service: Cardiovascular;  Laterality: N/A;   TEE WITHOUT CARDIOVERSION N/A 02/24/2017   Procedure: TRANSESOPHAGEAL ECHOCARDIOGRAM (  TEE);  Surgeon: Delight Ovens, MD;  Location: [facility] OR;  Service: Open Heart Surgery;  Laterality: N/A;    Family History  Problem Relation Age of Onset   Alzheimer's disease Maternal Grandfather    Cancer Mother        lymphoma   Alcohol abuse Father        smoker   Coronary artery disease Neg Hx    Stroke Neg Hx    Diabetes Neg Hx    Social History:  reports that he has never smoked. He has quit using smokeless tobacco. He reports previous alcohol use. He reports that he does not use drugs.  Allergies:  Allergies  Allergen Reactions   Losartan Other (See Comments)    hyperkalemia    Medications:                                                                                                                        I reviewed home medications   ROS:                                                                                                                                     14 systems reviewed and negative except above    Examination:                                                                                                       General: Appears well-developed  Psych: Affect appropriate to situation Eyes: No scleral injection HENT: No OP obstrucion Head: Normocephalic.  Cardiovascular: Normal rate and regular rhythm.  Respiratory: Effort normal and breath sounds normal to anterior ascultation GI: Soft.  No distension. There is no tenderness.  Skin: WDI    Neurological Examination Mental Status: Alert, oriented, thought content appropriate.  Speech fluent without evidence of aphasia.  Severely dysarthric.  Able to follow commands without difficulty. Cranial Nerves: II: Visual fields grossly normal,  III,IV, VI: ptosis not present, extra-ocular motions intact bilaterally, pupils  equal, round, reactive to light and accommodation V,VII: Left facial droop, reduced sensation to left facial touch VIII: hearing normal bilaterally IX,X: uvula rises symmetrically XI: bilateral shoulder shrug XII: midline tongue extension Motor: Right : Upper extremity   5/5    Left:     Upper extremity   0/5  Lower extremity   5/5     Lower extremity   2/5 Tone and bulk:normal tone throughout; no atrophy noted Sensory: Reduced sensation to light touch on the left side Deep Tendon Reflexes: 2+ and symmetric throughout Plantars: Right: downgoing   Left: downgoing Cerebellar: normal finger-to-nose, normal rapid alternating movements and normal heel-to-shin test Gait: normal gait and station     Lab Results: Basic Metabolic Panel: Recent Labs  Lab 07/19/19 1900 07/19/19 1908  NA 132* 135  K 3.8 3.8  CL 99 98  CO2 24  --   GLUCOSE 101* 98  BUN <5* 4*  CREATININE 0.71 0.70  CALCIUM 8.3*  --     CBC: Recent Labs  Lab 07/19/19 1900 07/19/19 1908  WBC 7.2  --   NEUTROABS 4.2  --   HGB 12.2* 12.2*  HCT 37.4* 36.0*  MCV 94.7  --   PLT 228  --     Coagulation Studies: Recent Labs    07/19/19 1900  LABPROT 13.4  INR 1.1    Imaging: CT HEAD CODE STROKE WO CONTRAST  Addendum Date: 07/19/2019   ADDENDUM  REPORT: 07/19/2019 19:34 ADDENDUM: These results were communicated to Dr. Laurence Slate At 7:33 pmon 7/5/2021by text page via the [facility] messaging system. Electronically Signed   By: Jackey Loge DO   On: 07/19/2019 19:34   Result Date: 07/19/2019 CLINICAL DATA:  Code stroke. Facial droop, altered mental status. Additional history provided: Last known normal 1645. EXAM: CT HEAD WITHOUT CONTRAST TECHNIQUE: Contiguous axial images were obtained from the base of the skull through the vertex without intravenous contrast. COMPARISON:  Brain MRI 05/27/2019, noncontrast head CT 05/27/2019, CT angiogram head/neck 05/27/2019. FINDINGS: Brain: Acute parenchymal hemorrhage centered within the right thalamocapsular junction measuring 1.4 x 2.0 x 2.6 cm (series 3, image 16) (series 5, image 36). Mild surrounding edema. No evidence of intraventricular extension at this time. Redemonstrated chronic infarct affecting the left temporoparietal lobes with associated chronic encephalomalacia and dystrophic calcification. Ex vacuo dilatation of the left lateral ventricle. Stable background mild generalized parenchymal atrophy and moderate chronic small vessel ischemic disease. No extra-axial fluid collection. No midline shift. Vascular: No hyperdense vessel.  Atherosclerotic calcifications. Skull: Normal. Negative for fracture or focal lesion. Sinuses/Orbits: Visualized orbits show no acute finding. Mild ethmoid and sphenoid sinus mucosal thickening. No significant mastoid effusion. IMPRESSION: 1. 1.4 x 2.0 x 2.6 cm acute parenchymal hemorrhage centered within the right thalamocapsular junction, a characteristic location for hypertensive hemorrhage. Mild associated edema. No intraventricular extension of hemorrhage at this time. 2. Redemonstrated chronic left temporoparietal infarct. 3. Stable background mild generalized parenchymal atrophy and moderate chronic small vessel ischemic disease. 4. Mild paranasal sinus mucosal thickening.  Electronically Signed: By: Jackey Loge DO On: 07/19/2019 19:20     ASSESSMENT AND PLAN   64 y.o. male with past medical history of deep vein thrombosis previously on Eliquis, history of hemorrhagic stroke secondary to hypertension with blood pressure of 240 systolic, hypertension, hyperlipidemia presents to the emergency department with sudden onset left-sided weakness that began at 4:45 PM.    Right thalamocapsular hemorrhage Admit to neuro ICU No antiplatelets, SCD BP goal less than 140/90 mmHg,  on Cleviprex Frequent neurochecks Swallow evaluation  HTN emergency  - on Cleveprix gtt, BP less than 140/90 mmhg   This patient is neurologically critically ill due to ICH.   He is at risk for significant risk of neurological worsening from cerebral edema,  death from brain herniation, heart failure, infection, respiratory failure and seizure. This patient's care requires constant monitoring of vital signs, hemodynamics, respiratory and cardiac monitoring, review of multiple databases, neurological assessment, discussion with family, other specialists and medical decision making of high complexity.  I spent 45  minutes of neurocritical time in the care of this patient.      Arlyce Circle [facility] Pager Number [phone]

## 2019-07-20 ENCOUNTER — Inpatient Hospital Stay (HOSPITAL_COMMUNITY): Payer: Medicare Other

## 2019-07-20 DIAGNOSIS — I619 Nontraumatic intracerebral hemorrhage, unspecified: Secondary | ICD-10-CM

## 2019-07-20 LAB — RAPID URINE DRUG SCREEN, HOSP PERFORMED
Amphetamines: NOT DETECTED
Barbiturates: NOT DETECTED
Benzodiazepines: NOT DETECTED
Cocaine: NOT DETECTED
Opiates: NOT DETECTED
Tetrahydrocannabinol: NOT DETECTED

## 2019-07-20 LAB — URINALYSIS, ROUTINE W REFLEX MICROSCOPIC
Bilirubin Urine: NEGATIVE
Glucose, UA: NEGATIVE mg/dL
Hgb urine dipstick: NEGATIVE
Ketones, ur: NEGATIVE mg/dL
Leukocytes,Ua: NEGATIVE
Nitrite: NEGATIVE
Protein, ur: NEGATIVE mg/dL
Specific Gravity, Urine: 1.003 — ABNORMAL LOW (ref 1.005–1.030)
pH: 7 (ref 5.0–8.0)

## 2019-07-20 MED ORDER — LEVETIRACETAM 750 MG PO TABS
1500.0000 mg | ORAL_TABLET | Freq: Two times a day (BID) | ORAL | Status: DC
  Administered 2019-07-20 – 2019-07-26 (×13): 1500 mg via ORAL
  Filled 2019-07-20 (×13): qty 2

## 2019-07-20 MED ORDER — ZIPRASIDONE HCL 40 MG PO CAPS
40.0000 mg | ORAL_CAPSULE | Freq: Every day | ORAL | Status: DC
  Administered 2019-07-20 – 2019-07-25 (×6): 40 mg via ORAL
  Filled 2019-07-20 (×8): qty 1

## 2019-07-20 MED ORDER — CARVEDILOL 3.125 MG PO TABS
3.1250 mg | ORAL_TABLET | Freq: Two times a day (BID) | ORAL | Status: DC
  Administered 2019-07-20 – 2019-07-25 (×10): 3.125 mg via ORAL
  Filled 2019-07-20 (×10): qty 1

## 2019-07-20 MED ORDER — SERTRALINE HCL 50 MG PO TABS
25.0000 mg | ORAL_TABLET | Freq: Every day | ORAL | Status: DC
  Administered 2019-07-20 – 2019-07-26 (×7): 25 mg via ORAL
  Filled 2019-07-20 (×10): qty 1

## 2019-07-20 MED ORDER — ADULT MULTIVITAMIN W/MINERALS CH
1.0000 | ORAL_TABLET | Freq: Every day | ORAL | Status: DC
  Administered 2019-07-20 – 2019-07-26 (×7): 1 via ORAL
  Filled 2019-07-20 (×7): qty 1

## 2019-07-20 MED ORDER — GADOBUTROL 1 MMOL/ML IV SOLN
8.0000 mL | Freq: Once | INTRAVENOUS | Status: AC | PRN
  Administered 2019-07-20: 8 mL via INTRAVENOUS

## 2019-07-20 MED ORDER — DIVALPROEX SODIUM 250 MG PO DR TAB
500.0000 mg | DELAYED_RELEASE_TABLET | Freq: Two times a day (BID) | ORAL | Status: DC
  Administered 2019-07-20 – 2019-07-26 (×13): 500 mg via ORAL
  Filled 2019-07-20 (×2): qty 1
  Filled 2019-07-20 (×4): qty 2
  Filled 2019-07-20 (×2): qty 1
  Filled 2019-07-20 (×2): qty 2
  Filled 2019-07-20: qty 1
  Filled 2019-07-20 (×4): qty 2

## 2019-07-20 MED ORDER — LACOSAMIDE 50 MG PO TABS
200.0000 mg | ORAL_TABLET | ORAL | Status: DC
  Administered 2019-07-20 – 2019-07-26 (×13): 200 mg via ORAL
  Filled 2019-07-20 (×13): qty 4

## 2019-07-20 NOTE — Progress Notes (Signed)
OT Cancellation Note  Patient Details Name: [name] Cruz MRN: [id number] DOB: [birth year]   Cancelled Treatment:    Reason Eval/Treat Not Completed: Active bedrest order. Will follow acutely and assess when medically appropriate. Thanks  Burnett Corrente Andra Heslin, OT/L   Acute OT Clinical Specialist Acute Rehabilitation Services Pager [phone] Office [phone]  07/20/2019, 8:11 AM

## 2019-07-20 NOTE — Progress Notes (Addendum)
Upon assessment, RN noticed that patient had 700 cc urine in 2 hours and then 1300 cc urine the next 2 hours. Total of 2000 cc in 4 hours. RN tested specific gravity and was slightly below normal at 1.004. Urine pale yellow and no neuro status changes with patient. Dr. Laurence Slate notified at this time. RN also sent down urinalysis that was ordered for the ED. Will continue to monitor patient and follow up.

## 2019-07-20 NOTE — Progress Notes (Signed)
Occupational Therapy Treatment Note  Pt seen for additional session to further assess self feeding. Pt does not appears to be seeing/attending to  items on L side of tray. Pt required assistance to properly position pt at midline with feet on floor to increase safe positioning for feeding. Pt reminded to check L cheek for any pocketing. Continue to recommend CIR for rehab.     07/20/19 1400  OT Visit Information  Last OT Received On 07/20/19  Assistance Needed +2  History of Present Illness 64 y.o. male with past medical history of deep vein thrombosis previously on Eliquis, history of hemorrhagic stroke secondary to hypertension with blood pressure of 240 systolic, hypertension, hyperlipidemia presents to the emergency department with sudden onset left-sided weakness. NIHSS 11. CT - Acute hemorrhage R thalamocapsular junction; mild associated edema   Precautions  Precautions Fall  Pain Assessment  Pain Assessment No/denies pain  Cognition  Arousal/Alertness Awake/alert  Behavior During Therapy Impulsive  Overall Cognitive Status Impaired/Different from baseline  ADL  Eating/Feeding Supervision/ safety;Set up  Eating/Feeding Details (indicate cue type and reason) asked tnsg to check for pocketing  General ADL Comments Pt able to self feed after set up with assistance to open containers. Pt apparently not seeing items on L side of tray. Difficulty either naming or seeing items on tray. called brocolli "salad" and unable to name mashed potatoes;  Poor awareness into opsitioniong as pt leaning toward R and slightly reclined with feet up. Chair placed in upright position with feet on floor. Additional pillow placed @ R flank to improve midline positioning  Vision- Assessment  Additional Comments ? L visual field cut  OT - End of Session  Activity Tolerance Patient tolerated treatment well  Patient left in chair;with call bell/phone within reach;with chair alarm set  Nurse Communication  Mobility status;Other (comment) (set up for feeding)  OT Assessment/Plan  OT Plan Discharge plan remains appropriate  OT Visit Diagnosis Unsteadiness on feet (R26.81);Other abnormalities of gait and mobility (R26.89);Muscle weakness (generalized) (M62.81);Other symptoms and signs involving cognitive function;Hemiplegia and hemiparesis  Hemiplegia - Right/Left Left  Hemiplegia - dominant/non-dominant Non-Dominant  Hemiplegia - caused by Cerebral infarction  OT Frequency (ACUTE ONLY) Min 2X/week  Recommendations for Other Services Rehab consult  Follow Up Recommendations CIR;Supervision/Assistance - 24 hour  OT Equipment None recommended by OT  AM-PAC OT "6 Clicks" Daily Activity Outcome Measure (Version 2)  Help from another person eating meals? 3  Help from another person taking care of personal grooming? 2  Help from another person toileting, which includes using toliet, bedpan, or urinal? 2  Help from another person bathing (including washing, rinsing, drying)? 2  Help from another person to put on and taking off regular upper body clothing? 2  Help from another person to put on and taking off regular lower body clothing? 2  6 Click Score 13  OT Goal Progression  Progress towards OT goals Progressing toward goals  Acute Rehab OT Goals  Patient Stated Goal to get better  OT Goal Formulation With patient  Time For Goal Achievement 08/03/19  Potential to Achieve Goals Good  ADL Goals  Pt Will Perform Eating with set-up;with supervision;sitting  Pt Will Perform Grooming with set-up;with supervision;sitting  Pt Will Perform Upper Body Bathing with supervision;with set-up;sitting  Pt Will Perform Lower Body Bathing with min guard assist;sit to/from stand  Pt Will Transfer to Toilet ambulating;with min assist  Additional ADL Goal #1 Pt will locate LUE and incorporate in functional mobility with min  vc  OT Time Calculation  OT Start Time (ACUTE ONLY) 1144  OT Stop Time (ACUTE ONLY) 1158   OT Time Calculation (min) 14 min  OT General Charges  $OT Visit 1 Visit  OT Treatments  $Self Care/Home Management  8-22 mins  Luisa Dago, OT/L   Acute OT Clinical Specialist Acute Rehabilitation Services Pager [phone] Office [phone]

## 2019-07-20 NOTE — Evaluation (Signed)
Speech Language Pathology Evaluation Patient Details Name: [name] Cruz MRN: [id number] DOB: 06-Apr-1955 Today's Date: 07/20/2019 Time: 0850-0906 SLP Time Calculation (min) (ACUTE ONLY): 16 min  Problem List:  Patient Active Problem List   Diagnosis Date Noted  . ICH (intracerebral hemorrhage) (HCC) 07/19/2019  . History of DVT in adulthood 02/21/2019  . PAD (peripheral artery disease) (HCC) 12/14/2018  . Agitation 09/04/2018  . Acute CVA (cerebrovascular accident) (HCC) 08/07/2018  . Cerebellar stroke, acute (HCC) 07/10/2018  . Status epilepticus (HCC) 03/25/2018  . Unilateral occipital headache 10/03/2017  . Ischemic stroke (HCC) 08/23/2017  . Coronary artery disease involving coronary bypass graft of native heart without angina pectoris   . Atrial fibrillation (HCC)   . Dysphagia, post-stroke   . Cardiac arrest (HCC) 08/09/2017  . S/P CABG x 3 02/24/2017  . NSTEMI (non-ST elevated myocardial infarction) (HCC) 02/21/2017  . Encephalomalacia 10/28/2012  . Seizure disorder (HCC) 04/26/2012  . Alcohol abuse 04/26/2012  . History of stroke with current residual effects   . Cognitive impairment   . HLD (hyperlipidemia)   . HTN (hypertension)    Past Medical History:  Past Medical History:  Diagnosis Date  . Acute deep vein thrombosis (DVT) of left upper extremity (HCC)    L brachial and basilic veins, eliquis started 08/22/2017 to continue for 3 months total   . Cognitive impairment 2007   after stroke, saw rehab but told to stop because was too upsetting to him  . History of chicken pox   . HLD (hyperlipidemia)   . HTN (hypertension)   . NSTEMI (non-ST elevated myocardial infarction) (HCC) 02/21/2017  . Obesity   . Stroke, hemorrhagic (HCC) 2007   thought 2/2 HTN (240sbp); residual cognitive impairment, loss of R peripheral field, no driving   Past Surgical History:  Past Surgical History:  Procedure Laterality Date  . ANKLE SURGERY  1990s   right foot with plate and  screws  . CORONARY ARTERY BYPASS GRAFT N/A 02/24/2017   3v Procedure: CORONARY ARTERY BYPASS GRAFTING (CABG) x 3 ON PUMP USING LEFT INTERNAL MAMMARY ARTERY TO LEFT ANTERIOR DESENDING CORNARY ARTERY, RIGHT GREATER SAPHENOUS VEIN TO LEFT CIRCUMFLEX ARTERY AND POSTERIOR DESENDING ARTERY. RIGHT GREATER SAPHENOUS VEIN OBTAINED VIA ENDOVEIN HARVEST.;  Surgeon: Delight Ovens, MD  . IABP INSERTION N/A 02/21/2017   Procedure: IABP Insertion;  Surgeon: Yvonne Kendall, MD;  Location: [facility];  Service: Cardiovascular;  Laterality: N/A;  . LEFT HEART CATH AND CORONARY ANGIOGRAPHY N/A 02/21/2017   Procedure: LEFT HEART CATH AND CORONARY ANGIOGRAPHY;  Surgeon: Yvonne Kendall, MD;  Location: [facility];  Service: Cardiovascular;  Laterality: N/A;  . TEE WITHOUT CARDIOVERSION N/A 02/24/2017   Procedure: TRANSESOPHAGEAL ECHOCARDIOGRAM (TEE);  Surgeon: Delight Ovens, MD;  Location: [facility];  Service: Open Heart Surgery;  Laterality: N/A;   HPI:  Pt is a 64 yo male presenting with sudden onset L sided weakness. CT showed R thalamic hemorrhage. PMH: DVT, hemorrhagic stroke with residual cognitive-linguistic impairment, HTN, HLD, seizures, NSTEMI   Assessment / Plan / Recommendation Clinical Impression  Pt has a h/o cognitive-linguistic impairments s/p prior stroke, but presents today with a possible acute change in function. He appears to have mildly increased difficulty following one-step commands and answering simple questions even when given binary choices or Mod cues. Mod cues are needed for simple verbal problem solving. Intellectual awareness is more impaired than his emergent awareness, as he cannot identify new symptoms other than knowing that he is  not back to his baseline. However, when asked to perform certain physical tasks, he does recognize that his L side is weaker than his R. Pt also presents with a mild dysarthria c/b reduced rate and precision of articulation, which appears to  be new compared to previous SLP evaluations. Recommend additional SLP f/u for cognition and communication, although will benefit from additional discussion with family to determine PLOF.    SLP Assessment  SLP Recommendation/Assessment: Patient needs continued Speech Lanaguage Pathology Services SLP Visit Diagnosis: Cognitive communication deficit (R41.841);Dysarthria and anarthria (R47.1)    Follow Up Recommendations  Inpatient Rehab    Frequency and Duration min 2x/week  2 weeks      SLP Evaluation Cognition  Overall Cognitive Status: Difficult to assess (baseline language impairments) Arousal/Alertness: Awake/alert Orientation Level: Oriented to person;Oriented to place;Disoriented to time;Disoriented to situation Awareness: Impaired Awareness Impairment: Intellectual impairment Problem Solving: Impaired Problem Solving Impairment: Functional basic;Verbal basic       Comprehension  Auditory Comprehension Overall Auditory Comprehension: Impaired Commands: Impaired One Step Basic Commands: 25-49% accurate Conversation: Simple EffectiveTechniques: Visual/Gestural cues    Expression Expression Primary Mode of Expression: Verbal Verbal Expression Overall Verbal Expression: Impaired at baseline Initiation: No impairment Automatic Speech: Name;Social Response;Day of week (Min cues for months of year) Level of Generative/Spontaneous Verbalization: Sentence Naming: Impairment Confrontation: Impaired Verbal Errors: Semantic paraphasias;Phonemic paraphasias;Other (comment) (word finding) Non-Verbal Means of Communication: Not applicable   Oral / Motor  Oral Motor/Sensory Function Overall Oral Motor/Sensory Function: Moderate impairment Facial ROM: Reduced left;Suspected CN VII (facial) dysfunction Facial Symmetry: Abnormal symmetry left;Suspected CN VII (facial) dysfunction Facial Strength: Reduced left;Suspected CN VII (facial) dysfunction Facial Sensation:  (suspect  reduced) Lingual ROM: Within Functional Limits;Other (Comment) (difficulty doing to command, but noted functionally) Velum: Within Functional Limits Motor Speech Overall Motor Speech: Impaired (slow rate) Respiration: Within functional limits Phonation: Normal Resonance: Within functional limits Articulation: Impaired Level of Impairment: Sentence Intelligibility: Intelligibility reduced Sentence: 75-100% accurate   GO                    Mahala Menghini., M.A. CCC-SLP Acute Rehabilitation Services Pager [phone] Office [phone]  07/20/2019, 9:29 AM

## 2019-07-20 NOTE — Progress Notes (Signed)
Occupational Therapy Evaluation Patient Details Name: [name] Cruz MRN: [id number] DOB: 02/16/1955 Today's Date: 07/20/2019    History of Present Illness 64 y.o. male with past medical history of deep vein thrombosis previously on Eliquis, history of hemorrhagic stroke secondary to hypertension with blood pressure of 240 systolic, hypertension, hyperlipidemia presents to the emergency department with sudden onset left-sided weakness. NIHSS 11. CT - Acute hemorrhage R thalamocapsular junction; mild associated edema    Clinical Impression   PTA, pt lived with his sister who assisted with medication and financial management and other IADL tasks; pt otherwise independent with mobility and self care without use of AD. Pt enjoys doing yardwork and able to recount falling off of his riding mower when he had his stroke. Pt with significant functional decline, requiring +2 Mod A for mobility and max assist with ADL tasks due to deficits listed below. Recommend rehab at CIR to maximize functional level of independence. Pt very motivated to "get better". Will follow acutely.     Follow Up Recommendations  CIR;Supervision/Assistance - 24 hour    Equipment Recommendations  None recommended by OT    Recommendations for Other Services Rehab consult     Precautions / Restrictions Precautions Precautions: Fall Restrictions Weight Bearing Restrictions: No      Mobility Bed Mobility Overal bed mobility: Needs Assistance Bed Mobility: Supine to Sit     Supine to sit: Min assist;HOB elevated - decreased awareness of L body during bed mobility        Transfers Overall transfer level: Needs assistance   Transfers: Sit to/from Stand;Stand Pivot Transfers Sit to Stand: Min assist;+2 physical assistance Stand pivot transfers: Mod assist;+2 physical assistance       General transfer comment: Difficulty motor planning during transfer; attempting to sit before completely at chair    Balance  Overall balance assessment: Needs assistance   Sitting balance-Leahy Scale: Poor Sitting balance - Comments: leaning R in chair Postural control: Right lateral lean   Standing balance-Leahy Scale: Zero                             ADL either performed or assessed with clinical judgement   ADL Overall ADL's : Needs assistance/impaired Eating/Feeding: Moderate assistance Eating/Feeding Details (indicate cue type and reason): nectar Grooming: Moderate assistance;Sitting   Upper Body Bathing: Moderate assistance;Sitting   Lower Body Bathing: Maximal assistance;Sit to/from stand   Upper Body Dressing : Moderate assistance;Sitting   Lower Body Dressing: Maximal assistance;Sit to/from stand   Toilet Transfer: +2 for physical assistance;Moderate assistance Toilet Transfer Details (indicate cue type and reason): simulated; poor awareness Toileting- Clothing Manipulation and Hygiene: Maximal assistance Toileting - Clothing Manipulation Details (indicate cue type and reason): foley     Functional mobility during ADLs: Moderate assistance;+2 for physical assistance;Cueing for safety;Cueing for sequencing       Vision Baseline Vision/History: Wears glasses Wears Glasses:  (unsure) Patient Visual Report: Peripheral vision impairment Vision Assessment?: Yes;Vision impaired- to be further tested in functional context Eye Alignment: Within Functional Limits Tracking/Visual Pursuits: Decreased smoothness of horizontal tracking;Decreased smoothness of vertical tracking Saccades: Additional head turns occurred during testing;Additional eye shifts occurred during testing Visual Fields: Impaired-to be further tested in functional context (States he has difficulty seeing on the R) Depth Perception: Overshoots Additional Comments: will further assess; L visual inattention; would not track or locate L arm; inconsistence with confrontation testing     Perception Perception Perception  Tested?: Yes Perception  Deficits: Inattention/neglect Inattention/Neglect: Does not attend to left side of body (decreased; would not pick up L arm; kept reaching around it)   Praxis Praxis Praxis tested?: Deficits Deficits: Limb apraxia;Perseveration;Ideomotor Praxis-Other Comments: Pt reached to pull up sock and continued to pull up sock; required cues to terminate; Pt trying to stand initially without foot placed on floor; difficulty motor planning regaridng use of remote/call bell; not identifiing color of buttons on remote    Pertinent Vitals/Pain Pain Assessment: No/denies pain Faces Pain Scale: Hurts a little bit Pain Location: L foot Pain Descriptors / Indicators: Sore Pain Intervention(s): Limited activity within patient's tolerance;Monitored during session     Hand Dominance Right   Extremity/Trunk Assessment Upper Extremity Assessment Upper Extremity Assessment: LUE deficits/detail LUE Deficits / Details: some attempts to use spontaneously; observed active shoulder elevation and elbow extension; no functional grasp LUE Sensation: decreased light touch;decreased proprioception LUE Coordination: decreased fine motor;decreased gross motor   Lower Extremity Assessment Lower Extremity Assessment: Defer to PT evaluation   Cervical / Trunk Assessment Cervical / Trunk Assessment: Other exceptions Cervical / Trunk Exceptions: R bias; poor midline orientation   Communication Communication Communication: Expressive difficulties   Cognition Arousal/Alertness: Awake/alert Behavior During Therapy: Impulsive Overall Cognitive Status: Impaired/Different from baseline Area of Impairment: Attention;Following commands;Safety/judgement;Awareness;Problem solving                   Current Attention Level: Sustained   Following Commands: Follows one step commands inconsistently Safety/Judgement: Decreased awareness of safety;Decreased awareness of deficits Awareness:  Emergent Problem Solving: Slow processing;Difficulty sequencing;Requires verbal cues;Requires tactile cues     General Comments  Has a 58 yo son who does roofing work; Therapist, 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    Risk analyst      Home Living Family/patient expects to be discharged to:: Private residence Living Arrangements: Other relatives (sister) Available Help at Discharge: Family;Available 24 hours/day Type of Home: Mobile home Home Access: Stairs to enter Entrance Stairs-Number of Steps: 3 Entrance Stairs-Rails: Can reach both Home Layout: One level     Bathroom Shower/Tub: Chief Strategy Officer: Standard Bathroom Accessibility: Yes How Accessible: Accessible via walker Home Equipment: Walker - 2 wheels;Cane - single point;Bedside commode;Shower seat;Grab bars - tub/shower      Lives With: Family    Prior Functioning/Environment Level of Independence: Independent        Comments: enjoys mowing grass and doing yardwork; sister helps wiht Tourist information centre manager; sister pays bills; does not dirve        OT Problem List: Decreased strength;Decreased range of motion;Decreased activity tolerance;Impaired balance (sitting and/or standing);Impaired vision/perception;Decreased coordination;Decreased cognition;Decreased safety awareness;Decreased knowledge of use of DME or AE;Cardiopulmonary status limiting activity;Impaired sensation;Impaired tone;Impaired UE functional use      OT Treatment/Interventions: Self-care/ADL training;Therapeutic exercise;Neuromuscular education;DME and/or AE instruction;Therapeutic activities;Cognitive remediation/compensation;Visual/perceptual remediation/compensation;Patient/family education;Balance training    OT Goals(Current goals can be found in the care plan section) Acute Rehab OT Goals Patient Stated Goal: to get better OT Goal Formulation: With patient Time For Goal Achievement:  08/03/19 Potential to Achieve Goals: Good  OT Frequency: Min 2X/week   Barriers to D/C:            Co-evaluation PT/OT/SLP Co-Evaluation/Treatment: Yes Reason for Co-Treatment: Complexity of the patient's impairments (multi-system involvement);Necessary to address cognition/behavior during functional activity;To address functional/ADL transfers   OT goals addressed during session: ADL's and self-care      AM-PAC OT "6 Clicks" Daily Activity  Outcome Measure Help from another person eating meals?: A Lot Help from another person taking care of personal grooming?: A Lot Help from another person toileting, which includes using toliet, bedpan, or urinal?: A Lot Help from another person bathing (including washing, rinsing, drying)?: A Lot Help from another person to put on and taking off regular upper body clothing?: A Lot Help from another person to put on and taking off regular lower body clothing?: A Lot 6 Click Score: 12   End of Session Equipment Utilized During Treatment: Gait belt Nurse Communication: Mobility status  Activity Tolerance: Patient tolerated treatment well Patient left: in chair;with call bell/phone within reach;with chair alarm set  OT Visit Diagnosis: Unsteadiness on feet (R26.81);Other abnormalities of gait and mobility (R26.89);Muscle weakness (generalized) (M62.81);Other symptoms and signs involving cognitive function;Hemiplegia and hemiparesis Hemiplegia - Right/Left: Left Hemiplegia - dominant/non-dominant: Non-Dominant Hemiplegia - caused by: Cerebral infarction                Time: 1030-1102 OT Time Calculation (min): 32 min Charges:  OT General Charges $OT Visit: 1 Visit OT Evaluation $OT Eval Moderate Complexity: 1 Mod  Georga Stys, OT/L   Acute OT Clinical Specialist Acute Rehabilitation Services Pager [phone] Office [phone]   [facility] 07/20/2019, 11:18 AM

## 2019-07-20 NOTE — Consult Note (Signed)
Physical Medicine and Rehabilitation Consult Reason for Consult:left side weakness Referring Physician: Dr Pearlean BrownieSethi   HPI: [name] Cruz is a 64 y.o.right handed male hyperlipidemia, seizure disorder, bipolar disorder maintained on Geodon, CAD with CABG 2019, hypertension with history of deep vein thrombosis previously on Eliquis discontinued due to hemorrhagic CVA 2007.  Per chart review patient lives with sister.  1 level home 3 steps to entry.  Independent prior to admission.  Presented 07/19/2019 with left-sided weakness and slurred speech.  Noted systolic blood pressure 240s.  Cranial CT scan showed a 1.4 x 2.0 x 2.6 cm acute parenchymal hemorrhage centered within the right thalamocapsular junction.  Redemonstrated a chronic left temporoparietal infarction.  MRI pending.  Alcohol level negative, Sodium 132, urine drug screen negative.  Cleviprex added for blood pressure control.  Patient remains on Vimpat as well as Depakote as prior to admission.Marland Kitchen.  Dysphagia #3 nectar thick liquid diet.  Therapy evaluation completed with recommendations of physical medicine rehab consult.  Review of Systems  Constitutional: Negative for chills and fever.  HENT: Negative for hearing loss.   Eyes: Negative for blurred vision and double vision.  Respiratory: Negative for shortness of breath.   Cardiovascular: Negative for chest pain, palpitations and leg swelling.  Gastrointestinal: Positive for constipation. Negative for heartburn and vomiting.  Genitourinary: Negative for dysuria, flank pain and hematuria.  Musculoskeletal: Positive for myalgias.  Skin: Negative for rash.  Neurological: Positive for speech change, seizures and weakness.  Psychiatric/Behavioral:       Bipolar disorder  All other systems reviewed and are negative.  Past Medical History:  Diagnosis Date  . Acute deep vein thrombosis (DVT) of left upper extremity (HCC)    L brachial and basilic veins, eliquis started 08/22/2017 to  continue for 3 months total   . Cognitive impairment 2007   after stroke, saw rehab but told to stop because was too upsetting to him  . History of chicken pox   . HLD (hyperlipidemia)   . HTN (hypertension)   . NSTEMI (non-ST elevated myocardial infarction) (HCC) 02/21/2017  . Obesity   . Stroke, hemorrhagic (HCC) 2007   thought 2/2 HTN (240sbp); residual cognitive impairment, loss of R peripheral field, no driving   Past Surgical History:  Procedure Laterality Date  . ANKLE SURGERY  1990s   right foot with plate and screws  . CORONARY ARTERY BYPASS GRAFT N/A 02/24/2017   3v Procedure: CORONARY ARTERY BYPASS GRAFTING (CABG) x 3 ON PUMP USING LEFT INTERNAL MAMMARY ARTERY TO LEFT ANTERIOR DESENDING CORNARY ARTERY, RIGHT GREATER SAPHENOUS VEIN TO LEFT CIRCUMFLEX ARTERY AND POSTERIOR DESENDING ARTERY. RIGHT GREATER SAPHENOUS VEIN OBTAINED VIA ENDOVEIN HARVEST.;  Surgeon: Delight OvensGerhardt, Edward B, MD  . IABP INSERTION N/A 02/21/2017   Procedure: IABP Insertion;  Surgeon: Yvonne KendallEnd, Christopher, MD;  Location: [facility];  Service: Cardiovascular;  Laterality: N/A;  . LEFT HEART CATH AND CORONARY ANGIOGRAPHY N/A 02/21/2017   Procedure: LEFT HEART CATH AND CORONARY ANGIOGRAPHY;  Surgeon: Yvonne KendallEnd, Christopher, MD;  Location: [facility];  Service: Cardiovascular;  Laterality: N/A;  . TEE WITHOUT CARDIOVERSION N/A 02/24/2017   Procedure: TRANSESOPHAGEAL ECHOCARDIOGRAM (TEE);  Surgeon: Delight OvensGerhardt, Edward B, MD;  [facility];  Service: Open Heart Surgery;  Laterality: N/A;   Family History  Problem Relation Age of Onset  . Alzheimer's disease Maternal Grandfather   . Cancer Mother        lymphoma  . Alcohol abuse Father  smoker  . Coronary artery disease Neg Hx   . Stroke Neg Hx   . Diabetes Neg Hx    Social History:  reports that he has never smoked. He has quit using smokeless tobacco. He reports previous alcohol use. He reports that he does not use drugs. Allergies:  Allergies  Allergen  Reactions  . Losartan Other (See Comments)    hyperkalemia   Medications Prior to Admission  Medication Sig Dispense Refill  . aspirin EC 81 MG tablet Take 1 tablet (81 mg total) by mouth daily.    Marland Kitchen atorvastatin (LIPITOR) 80 MG tablet TAKE 1 TABLET BY MOUTH EVERY DAY (Patient taking differently: Take 80 mg by mouth daily. ) 90 tablet 0  . carvedilol (COREG) 3.125 MG tablet TAKE 1 TABLET (3.125 MG TOTAL) BY MOUTH 2 (TWO) TIMES DAILY. 180 tablet 0  . Cyanocobalamin (B-12) 1000 MCG SUBL Place 1 tablet under the tongue daily. (Patient taking differently: Place 1,000 mcg under the tongue daily. ) 90 each 0  . divalproex (DEPAKOTE) 500 MG DR tablet Take 1 tablet (500 mg total) by mouth 2 (two) times daily. 60 tablet 2  . Lacosamide (VIMPAT) 100 MG TABS Take 2 tablets (200 mg total) by mouth in the morning and at bedtime. 200 mg 2 times a day.  Total 400 mg. 120 tablet 0  . levETIRAcetam (KEPPRA) 750 MG tablet Take 2 tablets (1,500 mg total) by mouth 2 (two) times daily. 120 tablet 6  . Multiple Vitamin (MULTIVITAMIN WITH MINERALS) TABS tablet Take 1 tablet by mouth daily. 90 tablet 0  . sertraline (ZOLOFT) 25 MG tablet TAKE 1 TABLET BY MOUTH EVERY DAY (Patient taking differently: Take 25 mg by mouth daily. ) 90 tablet 1  . ziprasidone (GEODON) 40 MG capsule TAKE 1 CAPSULE (40 MG TOTAL) BY MOUTH AT BEDTIME. 90 capsule 1    Home: Home Living Family/patient expects to be discharged to:: Private residence Living Arrangements: Other relatives (sister) Available Help at Discharge: Family, Available 24 hours/day Type of Home: Mobile home Home Access: Stairs to enter [employer] of Steps: 3 Entrance Stairs-Rails: Can reach both Home Layout: One level Bathroom Shower/Tub: Engineer, manufacturing systems: Standard Bathroom Accessibility: Yes Home Equipment: Environmental consultant - 2 wheels, Cane - single point, Bedside commode, Shower seat, Grab bars - tub/shower  Lives With: Family  Functional  History: Prior Function Level of Independence: Independent Comments: enjoys mowing grass and doing yardwork; sister helps wiht Tourist information centre manager; sister pays bills; does not dirve Functional Status:  Mobility: Bed Mobility Overal bed mobility: Needs Assistance Bed Mobility: Supine to Sit Supine to sit: Min assist, HOB elevated General bed mobility comments: +rail, cues for sequencing, increased time Transfers Overall transfer level: Needs assistance Equipment used: 2 person hand held assist Transfers: Sit to/from Stand, Stand Pivot Transfers Sit to Stand: Min assist, +2 physical assistance Stand pivot transfers: Mod assist, +2 physical assistance General transfer comment: Difficulty motor planning during transfer; attempting to sit before completely at chair     ADL: ADL Overall ADL's : Needs assistance/impaired Eating/Feeding: Moderate assistance Eating/Feeding Details (indicate cue type and reason): nectar Grooming: Moderate assistance, Sitting Upper Body Bathing: Moderate assistance, Sitting Lower Body Bathing: Maximal assistance, Sit to/from stand Upper Body Dressing : Moderate assistance, Sitting Lower Body Dressing: Maximal assistance, Sit to/from stand Toilet Transfer: +2 for physical assistance, Moderate assistance Toilet Transfer Details (indicate cue type and reason): simulated; poor awareness Toileting- Clothing Manipulation and Hygiene: Maximal assistance Toileting - Clothing Manipulation Details (indicate  cue type and reason): foley Functional mobility during ADLs: Moderate assistance, +2 for physical assistance, Cueing for safety, Cueing for sequencing  Cognition: Cognition Overall Cognitive Status: Impaired/Different from baseline Arousal/Alertness: Awake/alert Orientation Level: Oriented to person, Oriented to place, Disoriented to time, Disoriented to situation Awareness: Impaired Awareness Impairment: Intellectual impairment Problem Solving:  Impaired Problem Solving Impairment: Functional basic, Verbal basic Cognition Arousal/Alertness: Awake/alert Behavior During Therapy: Impulsive Overall Cognitive Status: Impaired/Different from baseline Area of Impairment: Attention, Following commands, Safety/judgement, Awareness, Problem solving Current Attention Level: Sustained Following Commands: Follows one step commands inconsistently Safety/Judgement: Decreased awareness of safety, Decreased awareness of deficits Awareness: Emergent Problem Solving: Slow processing, Difficulty sequencing, Requires verbal cues, Requires tactile cues  Blood pressure 108/73, pulse 71, temperature 97.9 F (36.6 C), temperature source Oral, resp. rate 13, weight 84.5 kg, SpO2 98 %.  Physical Exam General: Alert and oriented x 3, No apparent distress HEENT: Head is normocephalic, atraumatic, closing eyes tightly throughout most of examination, but does open them when requested to. sclera anicteric, oral mucosa pink and moist, dentition intact, ext ear canals clear,  Neck: Supple without JVD or lymphadenopathy Heart: Reg rate and rhythm. No murmurs rubs or gallops Chest: CTA bilaterally without wheezes, rales, or rhonchi; no distress Abdomen: Soft, non-tender, non-distended, bowel sounds positive. Extremities: No clubbing, cyanosis, or edema. Pulses are 2+ Skin: Clean and intact without signs of breakdown Neuro: Alert /NAD. Speech is dysarthric. Left sided facial droop. Follows commands. Decreased safety awareness.  He provides his name and age.  Limited medical historian. Left sided neglect. Severe left arm flaccidity. Right side 5/5 strength. LLE 2/5 throughout  Psych: Pt's affect is appropriate. Pt is cooperative  Results for orders placed or performed during the hospital encounter of 07/19/19 (from the past 24 hour(s))  Ethanol     Status: None   Collection Time: 07/19/19  7:00 PM  Result Value Ref Range   Alcohol, Ethyl (B) <10 <10 mg/dL   Protime-INR     Status: None   Collection Time: 07/19/19  7:00 PM  Result Value Ref Range   Prothrombin Time 13.4 11.4 - 15.2 seconds   INR 1.1 0.8 - 1.2  APTT     Status: None   Collection Time: 07/19/19  7:00 PM  Result Value Ref Range   aPTT 33 24 - 36 seconds  CBC     Status: Abnormal   Collection Time: 07/19/19  7:00 PM  Result Value Ref Range   WBC 7.2 4.0 - 10.5 K/uL   RBC 3.95 (L) 4.22 - 5.81 MIL/uL   Hemoglobin 12.2 (L) 13.0 - 17.0 g/dL   HCT 16.1 (L) 39 - 52 %   MCV 94.7 80.0 - 100.0 fL   MCH 30.9 26.0 - 34.0 pg   MCHC 32.6 30.0 - 36.0 g/dL   RDW 09.6 04.5 - 40.9 %   Platelets 228 150 - 400 K/uL   nRBC 0.0 0.0 - 0.2 %  Differential     Status: None   Collection Time: 07/19/19  7:00 PM  Result Value Ref Range   Neutrophils Relative % 57 %   Neutro Abs 4.2 1.7 - 7.7 K/uL   Lymphocytes Relative 27 %   Lymphs Abs 1.9 0.7 - 4.0 K/uL   Monocytes Relative 12 %   Monocytes Absolute 0.9 0 - 1 K/uL   Eosinophils Relative 2 %   Eosinophils Absolute 0.1 0 - 0 K/uL   Basophils Relative 1 %   Basophils Absolute 0.1 0 -  0 K/uL   Immature Granulocytes 1 %   Abs Immature Granulocytes 0.05 0.00 - 0.07 K/uL  Comprehensive metabolic panel     Status: Abnormal   Collection Time: 07/19/19  7:00 PM  Result Value Ref Range   Sodium 132 (L) 135 - 145 mmol/L   Potassium 3.8 3.5 - 5.1 mmol/L   Chloride 99 98 - 111 mmol/L   CO2 24 22 - 32 mmol/L   Glucose, Bld 101 (H) 70 - 99 mg/dL   BUN <5 (L) 8 - 23 mg/dL   Creatinine, Ser 3.54 0.61 - 1.24 mg/dL   Calcium 8.3 (L) 8.9 - 10.3 mg/dL   Total Protein 6.0 (L) 6.5 - 8.1 g/dL   Albumin 3.6 3.5 - 5.0 g/dL   AST 17 15 - 41 U/L   ALT 14 0 - 44 U/L   Alkaline Phosphatase 42 38 - 126 U/L   Total Bilirubin 0.5 0.3 - 1.2 mg/dL   GFR calc non Af Amer >60 >60 mL/min   GFR calc Af Amer >60 >60 mL/min   Anion gap 9 5 - 15  CBG monitoring, ED     Status: None   Collection Time: 07/19/19  7:00 PM  Result Value Ref Range   Glucose-Capillary  96 70 - 99 mg/dL  I-stat chem 8, ED     Status: Abnormal   Collection Time: 07/19/19  7:08 PM  Result Value Ref Range   Sodium 135 135 - 145 mmol/L   Potassium 3.8 3.5 - 5.1 mmol/L   Chloride 98 98 - 111 mmol/L   BUN 4 (L) 8 - 23 mg/dL   Creatinine, Ser 5.62 0.61 - 1.24 mg/dL   Glucose, Bld 98 70 - 99 mg/dL   Calcium, Ion 5.63 (L) 1.15 - 1.40 mmol/L   TCO2 23 22 - 32 mmol/L   Hemoglobin 12.2 (L) 13.0 - 17.0 g/dL   HCT 89.3 (L) 39 - 52 %  SARS Coronavirus 2 by RT PCR (hospital order, performed in [facility] hospital lab) Nasopharyngeal Nasopharyngeal Swab     Status: None   Collection Time: 07/19/19  7:39 PM   Specimen: Nasopharyngeal Swab  Result Value Ref Range   SARS Coronavirus 2 NEGATIVE NEGATIVE  MRSA PCR Screening     Status: None   Collection Time: 07/19/19  8:14 PM   Specimen: Nasal Mucosa; Nasopharyngeal  Result Value Ref Range   MRSA by PCR NEGATIVE NEGATIVE  Urine rapid drug screen (hosp performed)     Status: None   Collection Time: 07/20/19 12:05 AM  Result Value Ref Range   Opiates NONE DETECTED NONE DETECTED   Cocaine NONE DETECTED NONE DETECTED   Benzodiazepines NONE DETECTED NONE DETECTED   Amphetamines NONE DETECTED NONE DETECTED   Tetrahydrocannabinol NONE DETECTED NONE DETECTED   Barbiturates NONE DETECTED NONE DETECTED  Urinalysis, Routine w reflex microscopic     Status: Abnormal   Collection Time: 07/20/19 12:05 AM  Result Value Ref Range   Color, Urine STRAW (A) YELLOW   APPearance CLEAR CLEAR   Specific Gravity, Urine 1.003 (L) 1.005 - 1.030   pH 7.0 5.0 - 8.0   Glucose, UA NEGATIVE NEGATIVE mg/dL   Hgb urine dipstick NEGATIVE NEGATIVE   Bilirubin Urine NEGATIVE NEGATIVE   Ketones, ur NEGATIVE NEGATIVE mg/dL   Protein, ur NEGATIVE NEGATIVE mg/dL   Nitrite NEGATIVE NEGATIVE   Leukocytes,Ua NEGATIVE NEGATIVE   CT HEAD CODE STROKE WO CONTRAST  Addendum Date: 07/19/2019   ADDENDUM REPORT: 07/19/2019 19:34  ADDENDUM: These results were communicated  to Dr. Laurence Slate At 7:33 pmon 7/5/2021by text page via the [facility] messaging system. Electronically Signed   By: Jackey Loge DO   On: 07/19/2019 19:34   Result Date: 07/19/2019 CLINICAL DATA:  Code stroke. Facial droop, altered mental status. Additional history provided: Last known normal 1645. EXAM: CT HEAD WITHOUT CONTRAST TECHNIQUE: Contiguous axial images were obtained from the base of the skull through the vertex without intravenous contrast. COMPARISON:  Brain MRI 05/27/2019, noncontrast head CT 05/27/2019, CT angiogram head/neck 05/27/2019. FINDINGS: Brain: Acute parenchymal hemorrhage centered within the right thalamocapsular junction measuring 1.4 x 2.0 x 2.6 cm (series 3, image 16) (series 5, image 36). Mild surrounding edema. No evidence of intraventricular extension at this time. Redemonstrated chronic infarct affecting the left temporoparietal lobes with associated chronic encephalomalacia and dystrophic calcification. Ex vacuo dilatation of the left lateral ventricle. Stable background mild generalized parenchymal atrophy and moderate chronic small vessel ischemic disease. No extra-axial fluid collection. No midline shift. Vascular: No hyperdense vessel.  Atherosclerotic calcifications. Skull: Normal. Negative for fracture or focal lesion. Sinuses/Orbits: Visualized orbits show no acute finding. Mild ethmoid and sphenoid sinus mucosal thickening. No significant mastoid effusion. IMPRESSION: 1. 1.4 x 2.0 x 2.6 cm acute parenchymal hemorrhage centered within the right thalamocapsular junction, a characteristic location for hypertensive hemorrhage. Mild associated edema. No intraventricular extension of hemorrhage at this time. 2. Redemonstrated chronic left temporoparietal infarct. 3. Stable background mild generalized parenchymal atrophy and moderate chronic small vessel ischemic disease. 4. Mild paranasal sinus mucosal thickening. Electronically Signed: By: Jackey Loge DO On: 07/19/2019 19:20      Assessment/Plan: Diagnosis: Acute hemorrhagic right thalamocapsular stroke 1. Does the need for close, 24 hr/day medical supervision in concert with the patient's rehab needs make it unreasonable for this patient to be served in a less intensive setting? Yes 2. Co-Morbidities requiring supervision/potential complications: history of DVT on Eliquis, history of hemorrhagic stroke secondary to HTN, HTN, HLD 3. Due to bladder management, bowel management, safety, skin/wound care, disease management, medication administration, pain management and patient education, does the patient require 24 hr/day rehab nursing? Yes 4. Does the patient require coordinated care of a physician, rehab nurse, therapy disciplines of PT, OT, SLP to address physical and functional deficits in the context of the above medical diagnosis(es)? Yes Addressing deficits in the following areas: balance, endurance, locomotion, strength, transferring, bowel/bladder control, bathing, dressing, feeding, grooming, toileting, speech and psychosocial support 5. Can the patient actively participate in an intensive therapy program of at least 3 hrs of therapy per day at least 5 days per week? Yes 6. The potential for patient to make measurable gains while on inpatient rehab is excellent 7. Anticipated functional outcomes upon discharge from inpatient rehab are min assist  with PT, min assist with OT, independent and modified independent with SLP. 8. Estimated rehab length of stay to reach the above functional goals is: 12-18 days 9. Anticipated discharge destination: Home 10. Overall Rehab/Functional Prognosis: excellent  RECOMMENDATIONS: This patient's condition is appropriate for continued rehabilitative care in the following setting: CIR Patient has agreed to participate in recommended program. Yes Note that insurance prior authorization may be required for reimbursement for recommended care.  Comment: [name] Cruz would be an  excellent CIR candidate. He lives with his sister. He currently has significant left sided weakness and left sided neglect.   Mcarthur Rossetti Angiulli, PA-C 07/20/2019   I have personally performed a face to face diagnostic evaluation, including, but not  limited to relevant history and physical exam findings, of this patient and developed relevant assessment and plan.  Additionally, I have reviewed and concur with the physician assistant's documentation above.  Sula Soda, MD

## 2019-07-20 NOTE — Progress Notes (Signed)
STROKE TEAM PROGRESS NOTE   INTERVAL HISTORY I have personally reviewed history of present illness with the patient, electronic medical records and imaging films in PACS.  He presented with left hemiparesis secondary to right subcortical hemorrhage.  He has history of seizures and is on 3 different seizure medications.  He states left hemiparesis unchanged.  Blood pressure adequately controlled.  No neurological changes  Vitals:   07/20/19 0615 07/20/19 0645 07/20/19 0700 07/20/19 0800  BP: 122/73 130/78 (!) 146/77   Pulse: 64 67 68   Resp: 10 11 12    Temp:    97.6 F (36.4 C)  TempSrc:    Oral  SpO2: 98% 100% 99%   Weight:        CBC:  Recent Labs  Lab 07/19/19 1900 07/19/19 1908  WBC 7.2  --   NEUTROABS 4.2  --   HGB 12.2* 12.2*  HCT 37.4* 36.0*  MCV 94.7  --   PLT 228  --     Basic Metabolic Panel:  Recent Labs  Lab 07/19/19 1900 07/19/19 1908  NA 132* 135  K 3.8 3.8  CL 99 98  CO2 24  --   GLUCOSE 101* 98  BUN <5* 4*  CREATININE 0.71 0.70  CALCIUM 8.3*  --    Lipid Panel:     Component Value Date/Time   CHOL 143 07/14/2019 1207   CHOL 177 07/11/2017 0950   TRIG 148.0 07/14/2019 1207   HDL 35.90 (L) 07/14/2019 1207   HDL 96 07/11/2017 0950   CHOLHDL 4 07/14/2019 1207   VLDL 29.6 07/14/2019 1207   LDLCALC 78 07/14/2019 1207   LDLCALC 65 07/11/2017 0950   HgbA1c:  Lab Results  Component Value Date   HGBA1C 5.6 07/14/2019   Urine Drug Screen:     Component Value Date/Time   LABOPIA NONE DETECTED 07/20/2019 0005   COCAINSCRNUR NONE DETECTED 07/20/2019 0005   LABBENZ NONE DETECTED 07/20/2019 0005   AMPHETMU NONE DETECTED 07/20/2019 0005   THCU NONE DETECTED 07/20/2019 0005   LABBARB NONE DETECTED 07/20/2019 0005    Alcohol Level     Component Value Date/Time   ETH <10 07/19/2019 1900    IMAGING past 24 hours CT HEAD CODE STROKE WO CONTRAST  Addendum Date: 07/19/2019   ADDENDUM REPORT: 07/19/2019 19:34 ADDENDUM: These results were  communicated to Dr. 09/19/2019 At 7:33 pmon 7/5/2021by text page via the [facility] messaging system. Electronically Signed   By: [facility] DO   On: 07/19/2019 19:34   Result Date: 07/19/2019 CLINICAL DATA:  Code stroke. Facial droop, altered mental status. Additional history provided: Last known normal 1645. EXAM: CT HEAD WITHOUT CONTRAST TECHNIQUE: Contiguous axial images were obtained from the base of the skull through the vertex without intravenous contrast. COMPARISON:  Brain MRI 05/27/2019, noncontrast head CT 05/27/2019, CT angiogram head/neck 05/27/2019. FINDINGS: Brain: Acute parenchymal hemorrhage centered within the right thalamocapsular junction measuring 1.4 x 2.0 x 2.6 cm (series 3, image 16) (series 5, image 36). Mild surrounding edema. No evidence of intraventricular extension at this time. Redemonstrated chronic infarct affecting the left temporoparietal lobes with associated chronic encephalomalacia and dystrophic calcification. Ex vacuo dilatation of the left lateral ventricle. Stable background mild generalized parenchymal atrophy and moderate chronic small vessel ischemic disease. No extra-axial fluid collection. No midline shift. Vascular: No hyperdense vessel.  Atherosclerotic calcifications. Skull: Normal. Negative for fracture or focal lesion. Sinuses/Orbits: Visualized orbits show no acute finding. Mild ethmoid and sphenoid sinus mucosal thickening. No significant mastoid  effusion. IMPRESSION: 1. 1.4 x 2.0 x 2.6 cm acute parenchymal hemorrhage centered within the right thalamocapsular junction, a characteristic location for hypertensive hemorrhage. Mild associated edema. No intraventricular extension of hemorrhage at this time. 2. Redemonstrated chronic left temporoparietal infarct. 3. Stable background mild generalized parenchymal atrophy and moderate chronic small vessel ischemic disease. 4. Mild paranasal sinus mucosal thickening. Electronically Signed: By: Jackey Loge DO On: 07/19/2019 19:20     PHYSICAL EXAM Pleasant middle-aged Caucasian male not in distress. . Afebrile. Head is nontraumatic. Neck is supple without bruit.    Cardiac exam no murmur or gallop. Lungs are clear to auscultation. Distal pulses are well felt. Neurological Exam :  Awake alert oriented to time place and person.  No aphasia or apraxia but has mild dysarthria.  Extraocular movements full range without nystagmus.  Blinks to threat bilaterally.  Left lower facial weakness.  Tongue midline.  Motor system exam shows left hemiplegia with left upper extremity 0/5 and left lower extremity 2/5 strength.  Normal strength on the right side.  Decreased left hemibody sensation.  Left plantar upgoing right downgoing.  Gait not tested.  ASSESSMENT/PLAN [name] is a 64 y.o. male with history of deep vein thrombosis previously on Eliquis, history of hemorrhagic stroke secondary to hypertension with blood pressure 240 systolic, hypertension, hyperlipidemia presenting with L sided weakness.   Stroke:   R thalamocapsular hemorrhage - 2 separate lesions w/o significant HTN - concern for lesion  Code Stroke CT head R thalamocapsular hemorrhage w/ mild edema. Old L tempororparietal infarct. Small vessel disease. Atrophy. Sinus dz.  MRI w/w/o pending   MRA  pending   Carotid Doppler  pending   2D Echo pending   LDL 78  HgbA1c 5.6  SCDs for VTE prophylaxis  aspirin 81 mg daily prior to admission, now on No antithrombotic given hmg  Therapy recommendations:  CIR  Disposition:  pending   Monitor acute hemorrhage in ICU x 24h  Atrial Fibrillation  Home anticoagulation:  none   As per cardiology note, patient has PAF  Not on [facility] due to history of left MCA infarct with hemorrhagic conversion  Stroke in 06/2018 likely small vessel disease.  However, patient need to close follow-up with Dr. Pearlean Brownie to consider anticoagulation for stroke prevention if felt appropriate.   Hypertension  BP 158/88 on  arrival  Initially treated w/ cleviprex, now off  Home meds:  Coreg 3.125 bid  Stable . Resume coreg . SBP goal < 140 x 24h . Long-term BP goal normotensive  Hyperlipidemia  Home meds:  lipitor 80  Hold statin in setting of acute hemorrhage  LDL 78  Plan to continue statin at discharge  Other Stroke Risk Factors  Hx Cigarette smoker  Hx ETOH use  Obesity, Body mass index is 33 kg/m., recommend weight loss, diet and exercise as appropriate   Hx stroke/TIA   07/2018 - punctate acute/subacute white matter infarct involving right forceps major, right PCA territory, likely due to small vessel disease. asa & plavix x 3 wks then plavix alone.   06/2018 incidental right cerebellum infarct secondary to small vessel disease.  CT head and neck right ICA 60% stenosis.  Left ICA less than 50% stenosis.  LDL 69 and A1c 5.6.  Increase aspirin from 81 to 325.  Lipitor 80 continued.  2004 - Left MCA infarct with hemorrhagic conversion   Coronary artery disease s/p CABG  Hx UE DVT  Other Active Problems  Baseline cognitive impairment  Seizures  on vimpat, keppra and depakote. Last admitted 06/2019. Felt to originate from L MCA infarct in 2004  Hospital day # 1  He presented with left hemiparesis secondary to right brain subcortical hemorrhage likely of hypertensive etiology however his blood pressure has not been significantly elevated and underlying structural or vascular lesions need to be considered.  Recommend strict blood pressure control with systolic blood pressure goal below 140 for 24 hours and then below 160.  Close neurological monitoring.  Check MRI scan of the brain with and without contrast.  Aggressive risk factor modification.  No family available for discussion.This patient is critically ill and at significant risk of neurological worsening, death and care requires constant monitoring of vital signs, hemodynamics,respiratory and cardiac monitoring, extensive review of  multiple databases, frequent neurological assessment, discussion with family, other specialists and medical decision making of high complexity.I have made any additions or clarifications directly to the above note.This critical care time does not reflect procedure time, or teaching time or supervisory time of PA/NP/Med Resident etc but could involve care discussion time.  I spent 30 minutes of neurocritical care time  in the care of  this patient.    Delia Heady, MD  To contact Stroke Continuity provider, please refer to [email or web address]. After hours, contact General Neurology

## 2019-07-20 NOTE — Evaluation (Signed)
Physical Therapy Evaluation Patient Details Name: [name] Cruz MRN: [id number] DOB: [birth year] Today's Date: 07/20/2019   History of Present Illness  64 y.o. male with past medical history of deep vein thrombosis previously on Eliquis, history of hemorrhagic stroke secondary to hypertension with blood pressure of 240 systolic, hypertension, hyperlipidemia presents to the emergency department with sudden onset left-sided weakness. NIHSS 11. CT - Acute hemorrhage R thalamocapsular junction; mild associated edema     Clinical Impression  Pt admitted with above diagnosis. PTA pt lived with his sister, independent mobility without AD. On eval, he required min assist bed mobility, +2 min assist sit to stand, and +2 mod assist SPT toward right. He presents with decreased motor planning, L inattention, and decreased safety awareness. He is motivated to participate in therapy. Pt currently with functional limitations due to the deficits listed below (see PT Problem List). Pt will benefit from skilled PT to increase their independence and safety with mobility to allow discharge to the venue listed below.       Follow Up Recommendations CIR    Equipment Recommendations  Other (comment) (TBD)    Recommendations for Other Services Rehab consult     Precautions / Restrictions Precautions Precautions: Fall      Mobility  Bed Mobility Overal bed mobility: Needs Assistance Bed Mobility: Supine to Sit     Supine to sit: Min assist;HOB elevated     General bed mobility comments: +rail, cues for sequencing, increased time  Transfers Overall transfer level: Needs assistance Equipment used: 2 person hand held assist Transfers: Sit to/from [employer] Sit to Stand: Min assist;+2 physical assistance Stand pivot transfers: Mod assist;+2 physical assistance       General transfer comment: Difficulty motor planning during transfer; attempting to sit before completely at  chair  Ambulation/Gait                Stairs            Wheelchair Mobility    Modified Rankin (Stroke Patients Only) Modified Rankin (Stroke Patients Only) Pre-Morbid Rankin Score: No significant disability Modified Rankin: Moderately severe disability     Balance Overall balance assessment: Needs assistance Sitting-balance support: Feet supported;Single extremity supported Sitting balance-Leahy Scale: Poor Sitting balance - Comments: leaning R in chair Postural control: Right lateral lean Standing balance support: Bilateral upper extremity supported;During functional activity Standing balance-Leahy Scale: Zero Standing balance comment: full reliance on external support for balance                             Pertinent Vitals/Pain Pain Assessment: No/denies pain Faces Pain Scale: Hurts a little bit Pain Location: L foot Pain Descriptors / Indicators: Sore Pain Intervention(s): Limited activity within patient's tolerance;Monitored during session    Home Living Family/patient expects to be discharged to:: Private residence Living Arrangements: Other relatives (sister) Available Help at Discharge: Family;Available 24 hours/day Type of Home: Mobile home Home Access: Stairs to enter Entrance Stairs-Rails: Can reach both Entrance Stairs-Number of Steps: 3 Home Layout: One level Home Equipment: Walker - 2 wheels;Cane - single point;Bedside commode;Shower seat;Grab bars - tub/shower      Prior Function Level of Independence: Independent         Comments: enjoys mowing grass and doing yardwork; sister helps wiht Tourist information centre manager; sister pays bills; does not dirve     Hand Dominance   Dominant Hand: Right    Extremity/Trunk Assessment   Upper Extremity  Assessment Upper Extremity Assessment: Defer to OT evaluation LUE Deficits / Details: some attempts to use spontaneously; observed active shoulder elevation and elbow extension; no  functional grasp LUE Sensation: decreased light touch;decreased proprioception LUE Coordination: decreased fine motor;decreased gross motor    Lower Extremity Assessment Lower Extremity Assessment: LLE deficits/detail LLE Deficits / Details: increased tone with limited flexion, unable to isolate joint movement, tight heel cord with foot drop LLE Sensation: decreased proprioception LLE Coordination: decreased gross motor    Cervical / Trunk Assessment Cervical / Trunk Assessment: Other exceptions Cervical / Trunk Exceptions: R bias; poor midline orientation  Communication   Communication: Expressive difficulties  Cognition Arousal/Alertness: Awake/alert Behavior During Therapy: Impulsive Overall Cognitive Status: Impaired/Different from baseline Area of Impairment: Attention;Following commands;Safety/judgement;Awareness;Problem solving                   Current Attention Level: Sustained   Following Commands: Follows one step commands inconsistently Safety/Judgement: Decreased awareness of safety;Decreased awareness of deficits Awareness: Emergent Problem Solving: Slow processing;Difficulty sequencing;Requires verbal cues;Requires tactile cues        General Comments General comments (skin integrity, edema, etc.): Has a 90 yo son who does roofing work; Therapist, 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    Exercises     Assessment/Plan    PT Assessment Patient needs continued PT services  PT Problem List Decreased strength;Decreased mobility;Decreased safety awareness;Decreased coordination;Decreased knowledge of precautions;Decreased activity tolerance;Decreased cognition;Decreased balance;Decreased knowledge of use of DME       PT Treatment Interventions DME instruction;Therapeutic activities;Cognitive remediation;Gait training;Therapeutic exercise;Patient/family education;Balance training;Functional mobility training;Neuromuscular re-education    PT Goals (Current  goals can be found in the Care Plan section)  Acute Rehab PT Goals Patient Stated Goal: to get better PT Goal Formulation: With patient Time For Goal Achievement: 08/03/19 Potential to Achieve Goals: Good    Frequency Min 4X/week   Barriers to discharge        Co-evaluation PT/OT/SLP Co-Evaluation/Treatment: Yes Reason for Co-Treatment: Complexity of the patient's impairments (multi-system involvement);Necessary to address cognition/behavior during functional activity;To address functional/ADL transfers PT goals addressed during session: Mobility/safety with mobility;Balance OT goals addressed during session: ADL's and self-care       AM-PAC PT "6 Clicks" Mobility  Outcome Measure Help needed turning from your back to your side while in a flat bed without using bedrails?: A Little Help needed moving from lying on your back to sitting on the side of a flat bed without using bedrails?: A Little Help needed moving to and from a bed to a chair (including a wheelchair)?: A Lot Help needed standing up from a chair using your arms (e.g., wheelchair or bedside chair)?: A Lot Help needed to walk in hospital room?: A Lot Help needed climbing 3-5 steps with a railing? : Total 6 Click Score: 13    End of Session Equipment Utilized During Treatment: Gait belt Activity Tolerance: Patient tolerated treatment well Patient left: in chair;with call bell/phone within reach;with chair alarm set Nurse Communication: Mobility status PT Visit Diagnosis: Other abnormalities of gait and mobility (R26.89);Hemiplegia and hemiparesis;Apraxia (R48.2) Hemiplegia - Right/Left: Left Hemiplegia - dominant/non-dominant: Non-dominant Hemiplegia - caused by: Nontraumatic intracerebral hemorrhage    Time: 7564-3329 PT Time Calculation (min) (ACUTE ONLY): 23 min   Charges:   PT Evaluation $PT Eval Moderate Complexity: 1 Mod          Aida Raider, PT  Office # [phone] Pager [phone]   Ilda Foil 07/20/2019, 12:28 PM

## 2019-07-20 NOTE — Progress Notes (Signed)
PT Cancellation Note  Patient Details Name: ABSALOM ARO MRN: [id number] DOB: October 17, 1955   Cancelled Treatment:    Reason Eval/Treat Not Completed: Active bedrest order. Please update activity order when appropriate for PT to proceed with eval. Thank you.   Ilda Foil 07/20/2019, 7:37 AM  Aida Raider, PT  Office # [phone] Pager [phone]

## 2019-07-20 NOTE — Evaluation (Signed)
Clinical/Bedside Swallow Evaluation Patient Details  Name: [name] Cruz MRN: [id number] Date of Birth: July 31, 1955  Today's Date: 07/20/2019 Time: SLP Start Time (ACUTE ONLY): 5188 SLP Stop Time (ACUTE ONLY): 0850 SLP Time Calculation (min) (ACUTE ONLY): 12 min  Past Medical History:  Past Medical History:  Diagnosis Date  . Acute deep vein thrombosis (DVT) of left upper extremity (HCC)    L brachial and basilic veins, eliquis started 08/22/2017 to continue for 3 months total   . Cognitive impairment 2007   after stroke, saw rehab but told to stop because was too upsetting to him  . History of chicken pox   . HLD (hyperlipidemia)   . HTN (hypertension)   . NSTEMI (non-ST elevated myocardial infarction) (HCC) 02/21/2017  . Obesity   . Stroke, hemorrhagic (HCC) 2007   thought 2/2 HTN (240sbp); residual cognitive impairment, loss of R peripheral field, no driving   Past Surgical History:  Past Surgical History:  Procedure Laterality Date  . ANKLE SURGERY  1990s   right foot with plate and screws  . CORONARY ARTERY BYPASS GRAFT N/A 02/24/2017   3v Procedure: CORONARY ARTERY BYPASS GRAFTING (CABG) x 3 ON PUMP USING LEFT INTERNAL MAMMARY ARTERY TO LEFT ANTERIOR DESENDING CORNARY ARTERY, RIGHT GREATER SAPHENOUS VEIN TO LEFT CIRCUMFLEX ARTERY AND POSTERIOR DESENDING ARTERY. RIGHT GREATER SAPHENOUS VEIN OBTAINED VIA ENDOVEIN HARVEST.;  Surgeon: Delight Ovens, MD  . IABP INSERTION N/A 02/21/2017   Procedure: IABP Insertion;  Surgeon: Yvonne Kendall, MD;  Location: [facility] INVASIVE CV LAB;  Service: Cardiovascular;  Laterality: N/A;  . LEFT HEART CATH AND CORONARY ANGIOGRAPHY N/A 02/21/2017   Procedure: LEFT HEART CATH AND CORONARY ANGIOGRAPHY;  Surgeon: Yvonne Kendall, MD;  Location: [facility] INVASIVE CV LAB;  Service: Cardiovascular;  Laterality: N/A;  . TEE WITHOUT CARDIOVERSION N/A 02/24/2017   Procedure: TRANSESOPHAGEAL ECHOCARDIOGRAM (TEE);  Surgeon: Delight Ovens, MD;  Location: [facility] OR;   Service: Open Heart Surgery;  Laterality: N/A;   HPI:  Pt is a 64 yo male presenting with sudden onset L sided weakness. CT showed R thalamic hemorrhage. PMH: DVT, hemorrhagic stroke with residual cognitive-linguistic impairment, HTN, HLD, seizures, NSTEMI   Assessment / Plan / Recommendation Clinical Impression  Pt has L-sided facial weakness (CN VII) with suspected reduced sensation, although he does self-manage anterior loss with extra time. There is coughing noted with thin liquids and he cannot drink three ounces of water consecutively without either stopping or coughing. Suspect decreased control of thin liquids in light of CN impairments, as there are no other overt s/s of aspiration with any other consistency even when he takes in large volumes at a time. Recommend starting Dys 3 diet and nectar thick liquids by cup to start nutrition with potential need for MBS if s/s of potential aspiration with thin liquids persists.   SLP Visit Diagnosis: Dysphagia, oropharyngeal phase (R13.12)    Aspiration Risk  Mild aspiration risk;Moderate aspiration risk    Diet Recommendation Dysphagia 3 (Mech soft);Nectar-thick liquid   Liquid Administration via: Cup;No straw Medication Administration: Whole meds with puree Supervision: Staff to assist with self feeding Compensations: Slow rate;Small sips/bites;Monitor for anterior loss Postural Changes: Seated upright at 90 degrees    Other  Recommendations Oral Care Recommendations: Oral care BID Other Recommendations: Order thickener from pharmacy;Prohibited food (jello, ice cream, thin soups);Remove water pitcher   Follow up Recommendations Inpatient Rehab      Frequency and Duration min 2x/week  2 weeks       Prognosis  Prognosis for Safe Diet Advancement: Good Barriers to Reach Goals: Cognitive deficits;Language deficits      Swallow Study   General HPI: Pt is a 64 yo male presenting with sudden onset L sided weakness. CT showed R  thalamic hemorrhage. PMH: DVT, hemorrhagic stroke with residual cognitive-linguistic impairment, HTN, HLD, seizures, NSTEMI Type of Study: Bedside Swallow Evaluation Previous Swallow Assessment: previous BSEs with swallow function grossly functional Diet Prior to this Study: NPO Temperature Spikes Noted: No Respiratory Status: Room air History of Recent Intubation: No Behavior/Cognition: Alert;Cooperative;Requires cueing Oral Cavity Assessment: Within Functional Limits Oral Care Completed by SLP: No Oral Cavity - Dentition: Dentures, top (natural dentition on bottom) Vision: Functional for self-feeding Self-Feeding Abilities: Able to feed self;Needs set up Patient Positioning: Upright in bed Baseline Vocal Quality: Normal Volitional Swallow: Able to elicit    Oral/Motor/Sensory Function Overall Oral Motor/Sensory Function: Moderate impairment Facial ROM: Reduced left;Suspected CN VII (facial) dysfunction Facial Symmetry: Abnormal symmetry left;Suspected CN VII (facial) dysfunction Facial Strength: Reduced left;Suspected CN VII (facial) dysfunction Facial Sensation:  (suspect reduced) Lingual ROM: Within Functional Limits;Other (Comment) (difficulty doing to command, but noted functionally) Velum: Within Functional Limits   Ice Chips Ice chips: Within functional limits Presentation: Spoon   Thin Liquid Thin Liquid: Impaired Presentation: Cup;Self Fed;Straw;Spoon Pharyngeal  Phase Impairments: Cough - Immediate;Cough - Delayed    Nectar Thick Nectar Thick Liquid: Within functional limits Presentation: Cup;Self Fed   Honey Thick Honey Thick Liquid: Not tested   Puree Puree: Impaired Presentation: Self Fed;Spoon Oral Phase Impairments: Reduced labial seal Oral Phase Functional Implications: Left anterior spillage   Solid     Solid: Within functional limits Presentation: Self Fed      Mahala Menghini., M.A. CCC-SLP Acute Rehabilitation Services Pager [phone] Office  [phone]  07/20/2019,9:18 AM

## 2019-07-20 NOTE — Progress Notes (Signed)
Inpatient Rehab Admissions Coordinator Note:   Per therapy recommendations, pt was screened for CIR candidacy by Estill Dooms, PT, DPT.  At this time we are recommending CIR consult.  I will place an order per our protocol.  Please contact me with questions.   Estill Dooms, PT, DPT [phone] 07/20/19 1:58 PM

## 2019-07-21 ENCOUNTER — Inpatient Hospital Stay (HOSPITAL_COMMUNITY): Payer: Medicare Other

## 2019-07-21 DIAGNOSIS — I619 Nontraumatic intracerebral hemorrhage, unspecified: Secondary | ICD-10-CM

## 2019-07-21 DIAGNOSIS — I6389 Other cerebral infarction: Secondary | ICD-10-CM

## 2019-07-21 LAB — ECHOCARDIOGRAM COMPLETE: Weight: 2980.62 oz

## 2019-07-21 MED ORDER — RESOURCE THICKENUP CLEAR PO POWD
ORAL | Status: DC | PRN
  Filled 2019-07-21: qty 125

## 2019-07-21 MED ORDER — PANTOPRAZOLE SODIUM 40 MG PO TBEC
40.0000 mg | DELAYED_RELEASE_TABLET | Freq: Every day | ORAL | Status: DC
  Administered 2019-07-21 – 2019-07-25 (×5): 40 mg via ORAL
  Filled 2019-07-21 (×5): qty 1

## 2019-07-21 NOTE — Progress Notes (Signed)
Physical Therapy Treatment Patient Details Name: [name] Cruz MRN: [id number] DOB: 11/25/1955 Today's Date: 07/21/2019    History of Present Illness 64 y.o. male with past medical history of deep vein thrombosis previously on Eliquis, history of hemorrhagic stroke secondary to hypertension with blood pressure of 240 systolic, hypertension, hyperlipidemia presents to the emergency department with sudden onset left-sided weakness. NIHSS 11. CT - Acute hemorrhage R thalamocapsular junction; mild associated edema     PT Comments    Pt was able to stand multiple times and attempted side stepping with left knee blocked.  He remains mod assist overall with one to two person assist for safe mobility.  He would likely do well with transfers using the stedy when not working with therapy.  He remains appropriate for post acute intensive therapy.  PT will continue to follow acutely for safe mobility progression.  Attempt stepping forward and back at EOB next session and think about L platform?     Follow Up Recommendations  CIR     Equipment Recommendations  Wheelchair (measurements PT);Wheelchair cushion (measurements PT);3in1 (PT);Hospital bed    Recommendations for Other Services Rehab consult     Precautions / Restrictions Precautions Precautions: Fall Precaution Comments: left sided weakness and inattention    Mobility  Bed Mobility Overal bed mobility: Needs Assistance Bed Mobility: Supine to Sit;Sit to Supine     Supine to sit: Mod assist;HOB elevated Sit to supine: Mod assist;HOB elevated   General bed mobility comments: Mod assist to support trunk to come to sitting EOB. Multimodal cues for sequencing and re-focus to task at hand. Mod assist to lift both legs back into bed, pt able to control trunk some with R hand on bed rail.   Transfers Overall transfer level: Needs assistance Equipment used: 1 person hand held assist;2 person hand held assist Transfers: Sit to/from  Stand Sit to Stand: +2 physical assistance;Mod assist         General transfer comment: One and two person mod assist to stand x4 EOB.  Attempted to side step with difficulty.  Left leg blocked, but pt having difficulty both sequencing and trusting that his left leg would not buckle even blocked.        Modified Rankin (Stroke Patients Only) Modified Rankin (Stroke Patients Only) Pre-Morbid Rankin Score: No significant disability Modified Rankin: Moderately severe disability     Balance Overall balance assessment: Needs assistance Sitting-balance support: Feet supported;Bilateral upper extremity supported;Single extremity supported Sitting balance-Leahy Scale: Poor Sitting balance - Comments: mod assist due to left lateral lean EOB, however, compliant airmattress was also pushing pt and therapist to the left.  Postural control: Left lateral lean Standing balance support: Single extremity supported Standing balance-Leahy Scale: Poor Standing balance comment: mod assist with R hand supported on bed rail to stand. Left leg blocked and both lower legs supported on the bed.                             Cognition Arousal/Alertness: Awake/alert Behavior During Therapy: WFL for tasks assessed/performed Overall Cognitive Status: Impaired/Different from baseline Area of Impairment: Attention;Memory;Following commands;Safety/judgement;Awareness;Problem solving                   Current Attention Level: Sustained (needs redirection to task frequently) Memory: Decreased short-term memory ("did I fall" asking his sister re: what happened) Following Commands: Follows one step commands inconsistently;Follows one step commands with increased time Safety/Judgement: Decreased awareness of safety;Decreased  awareness of deficits Awareness: Intellectual Problem Solving: Slow processing;Difficulty sequencing;Requires verbal cues;Requires tactile cues General Comments: Asked pt to  come sit up on the side of the bed. He started moving his legs over to the side and then stopped, lifting one foot with the other foot repetitively, then looking up at me for guidance.  He is unable to report his situation, but can tell me his name and his sister's name.               Pertinent Vitals/Pain Pain Assessment: No/denies pain Faces Pain Scale: No hurt           PT Goals (current goals can now be found in the care plan section) Acute Rehab PT Goals Patient Stated Goal: to get better Progress towards PT goals: Progressing toward goals    Frequency    Min 4X/week      PT Plan Current plan remains appropriate       AM-PAC PT "6 Clicks" Mobility   Outcome Measure  Help needed turning from your back to your side while in a flat bed without using bedrails?: A Lot Help needed moving from lying on your back to sitting on the side of a flat bed without using bedrails?: A Lot Help needed moving to and from a bed to a chair (including a wheelchair)?: A Lot Help needed standing up from a chair using your arms (e.g., wheelchair or bedside chair)?: A Lot Help needed to walk in hospital room?: Total Help needed climbing 3-5 steps with a railing? : Total 6 Click Score: 10    End of Session   Activity Tolerance: Patient tolerated treatment well Patient left: in bed;with call bell/phone within reach;with family/visitor present;with nursing/sitter in room Nurse Communication: Mobility status PT Visit Diagnosis: Other abnormalities of gait and mobility (R26.89);Hemiplegia and hemiparesis;Apraxia (R48.2) Hemiplegia - Right/Left: Left Hemiplegia - dominant/non-dominant: Non-dominant Hemiplegia - caused by: Nontraumatic intracerebral hemorrhage     Time: 5916-3846 PT Time Calculation (min) (ACUTE ONLY): 31 min  Charges:  $Therapeutic Activity: 8-22 mins $Neuromuscular Re-education: 8-22 mins                    Corinna Capra, PT, DPT  Acute Rehabilitation [phone]  pager [phone]) [phone] office

## 2019-07-21 NOTE — Progress Notes (Signed)
Modified Barium Swallow Progress Note  Patient Details  Name: [name] Cruz MRN: [id number] Date of Birth: 1955-11-04  Today's Date: 07/21/2019  Modified Barium Swallow completed.  Full report located under Chart Review in the Imaging Section.  Brief recommendations include the following:  Clinical Impression  Pt presents with a moderate oropharyngeal dysphagia. He has limited and slow lingual movement, resulting in reduced lingual control for containment and posterior propulsion. Mastication is disorganized, but overall there is mild oral residue and minimal anterior spillage. Swallow triggers primarily at the pyriform sinuses with thin liquids spilling over into the airway silently and in small amounts before the swallow is initiated. He also has reduced pharyngeal squeeze, base of tongue retraction, and anterior hyolaryngeal movement that leaves mild residue in the pyriform sinuses. With residue already present int he pharynx, straw sips of nectar thick liquids are also silent aspirated before the swallow. A chin tuck does not improve his airway protection and he needs moderate amounts of cueing to implement it, making it an ineffective strategy. He had improved ariway protection with thin liquids when cued to take a small sip, hold it anteriorly in his mouth, and then swallow. Although it was still triggered at the pyriform sinuses, he had improving coordination and smaller boluses sizes that could be better contained. Recommend keeping him on Dys 3 diet and nectar thick liquids with SLP f/u to facilitate strategy use with therapeutic trials of thin liquids and potential exercises.   Swallow Evaluation Recommendations       SLP Diet Recommendations: Dysphagia 3 (Mech soft) solids;Nectar thick liquid   Liquid Administration via: Cup;No straw   Medication Administration: Whole meds with puree   Supervision: Staff to assist with self feeding   Compensations: Slow rate;Small  sips/bites;Monitor for anterior loss   Postural Changes: Seated upright at 90 degrees   Oral Care Recommendations: Oral care BID   Other Recommendations: Order thickener from pharmacy;Prohibited food (jello, ice cream, thin soups);Remove water pitcher    Mahala Menghini., M.A. CCC-SLP Acute Rehabilitation Services Pager [phone] Office [phone]  07/21/2019,2:15 PM

## 2019-07-21 NOTE — Progress Notes (Signed)
STROKE TEAM PROGRESS NOTE   INTERVAL HISTORY Patient remains neurologically stable.  Is awake alert but still has persistent left hemiparesis.  Blood pressure adequately controlled.  No neurological changes.  MRI scan shows stable appearance of 2.6 cm right subcortical hemorrhage and there is a small focus of enhancement within the ventral medial aspect of the hemorrhage which is of unclear significance.  MRA shows no significant large vessel stenosis or occlusion. Carotid ultrasound shows no significant extracranial bilateral carotid stenosis.  2D echo shows ejection fraction of 55 to 60% with no cardiac source embolism. Vitals:   07/21/19 1200 07/21/19 1257 07/21/19 1343 07/21/19 1400  BP: (!) 89/48 118/71 130/76 126/82  Pulse: (!) 54 66    Resp: 12   11  Temp:      TempSrc:      SpO2: 99% 99%    Weight:        CBC:  Recent Labs  Lab 07/19/19 1900 07/19/19 1908  WBC 7.2  --   NEUTROABS 4.2  --   HGB 12.2* 12.2*  HCT 37.4* 36.0*  MCV 94.7  --   PLT 228  --     Basic Metabolic Panel:  Recent Labs  Lab 07/19/19 1900 07/19/19 1908  NA 132* 135  K 3.8 3.8  CL 99 98  CO2 24  --   GLUCOSE 101* 98  BUN <5* 4*  CREATININE 0.71 0.70  CALCIUM 8.3*  --    Lipid Panel:     Component Value Date/Time   CHOL 143 07/14/2019 1207   CHOL 177 07/11/2017 0950   TRIG 148.0 07/14/2019 1207   HDL 35.90 (L) 07/14/2019 1207   HDL 96 07/11/2017 0950   CHOLHDL 4 07/14/2019 1207   VLDL 29.6 07/14/2019 1207   LDLCALC 78 07/14/2019 1207   LDLCALC 65 07/11/2017 0950   HgbA1c:  Lab Results  Component Value Date   HGBA1C 5.6 07/14/2019   Urine Drug Screen:     Component Value Date/Time   LABOPIA NONE DETECTED 07/20/2019 0005   COCAINSCRNUR NONE DETECTED 07/20/2019 0005   LABBENZ NONE DETECTED 07/20/2019 0005   AMPHETMU NONE DETECTED 07/20/2019 0005   THCU NONE DETECTED 07/20/2019 0005   LABBARB NONE DETECTED 07/20/2019 0005    Alcohol Level     Component Value Date/Time    ETH <10 07/19/2019 1900    IMAGING past 24 hours MR ANGIO HEAD WO CONTRAST  Result Date: 07/20/2019 CLINICAL DATA:  Stroke, follow-up. EXAM: MRI HEAD WITHOUT AND WITH CONTRAST MRA HEAD WITHOUT CONTRAST TECHNIQUE: Multiplanar, multiecho pulse sequences of the brain and surrounding structures were obtained without and with intravenous contrast. Angiographic images of the head were obtained using MRA technique without contrast. CONTRAST:  8mL GADAVIST GADOBUTROL 1 MMOL/ML IV SOLN COMPARISON:  Noncontrast head CT 07/19/2019, brain MRI 11/11/2018, CT angiogram head/neck 05/27/2019. FINDINGS: MRI HEAD FINDINGS Brain: Unchanged size of a 2.6 cm acute/early subacute parenchymal hemorrhage centered within the right thalamocapsular junction. Mild surrounding edema also appears unchanged. There is a 2 mm round focus of enhancement within the ventromedial aspect of the hemorrhage (series 12, image 34). Otherwise, no nodular or masslike enhancement is demonstrated in this region. There is no significant ventricular effacement. No midline shift. Redemonstrated chronic left temporoparietal cortical/subcortical infarct with a small amount of chronic hemosiderin deposition at this site. Ex vacuo dilatation of the left lateral ventricle. Stable moderate patchy T2/FLAIR hyperintensity within the cerebral white matter which is nonspecific, but consistent with chronic small vessel ischemic disease.  Similar appearance of scattered supratentorial and infratentorial chronic microhemorrhages. Stable, mild generalized parenchymal atrophy. No extra-axial fluid collection. Vascular: Expected proximal arterial flow voids. Skull and upper cervical spine: No focal marrow lesion. Sinuses/Orbits: Visualized orbits show no acute finding. Mild ethmoid sinus mucosal thickening. Bilateral mastoid effusions (larger on the left). MRA HEAD FINDINGS The examination is moderately motion degraded which limits evaluation for stenoses and small aneurysms.  Most notably, this limits evaluation for stenoses within the M2 and more distal MCA branch vessels bilaterally. This also limits evaluation for stenoses within the distal A2 and more distal anterior cerebral arteries. The intracranial internal carotid arteries are patent without high-grade stenosis. The M1 middle cerebral arteries are patent without high-grade stenosis. No acute proximal M2 branch occlusion is identified. The intracranial vertebral arteries are patent without high-grade stenosis, as is the basilar artery. The posterior cerebral arteries are patent proximally without high-grade stenosis. Predominantly fetal origin of the right posterior cerebral artery. No intracranial aneurysm or vascular malformation is identified. IMPRESSION: MRI brain: 1. Unchanged size of a 2.6 cm acute/early subacute parenchymal hemorrhage centered within the right thalamocapsular junction. There is a 2 mm round focus of enhancement within the ventromedial aspect of the hemorrhage. This may reflect vascular enhancement. However, short-interval 4-6 week contrast-enhanced MRI follow-up is recommended to exclude an underlying enhancing lesion at this site. 2. Redemonstrated chronic left temporoparietal cortical/subcortical infarct. 3. Stable mild generalized parenchymal atrophy and moderate chronic small vessel ischemic disease. 4. Scattered supratentorial and infratentorial chronic microhemorrhages, which likely reflect sequela of chronic hypertensive microangiopathy. Superimposed cerebral amyloid angiopathy is difficult to exclude. 5. Mild ethmoid sinus mucosal thickening. 6. Left greater than right mastoid effusions. MRA head: 1. Moderately motion degraded examination, limiting evaluation. 2. No intracranial large vessel occlusion. 3. No acute medium-sized vessel occlusion is identified. 4. No intracranial aneurysm or vascular malformation is identified. Electronically Signed   By: Jackey Loge DO   On: 07/20/2019 17:19   MR  BRAIN W WO CONTRAST  Result Date: 07/20/2019 CLINICAL DATA:  Stroke, follow-up. EXAM: MRI HEAD WITHOUT AND WITH CONTRAST MRA HEAD WITHOUT CONTRAST TECHNIQUE: Multiplanar, multiecho pulse sequences of the brain and surrounding structures were obtained without and with intravenous contrast. Angiographic images of the head were obtained using MRA technique without contrast. CONTRAST:  30mL GADAVIST GADOBUTROL 1 MMOL/ML IV SOLN COMPARISON:  Noncontrast head CT 07/19/2019, brain MRI 11/11/2018, CT angiogram head/neck 05/27/2019. FINDINGS: MRI HEAD FINDINGS Brain: Unchanged size of a 2.6 cm acute/early subacute parenchymal hemorrhage centered within the right thalamocapsular junction. Mild surrounding edema also appears unchanged. There is a 2 mm round focus of enhancement within the ventromedial aspect of the hemorrhage (series 12, image 34). Otherwise, no nodular or masslike enhancement is demonstrated in this region. There is no significant ventricular effacement. No midline shift. Redemonstrated chronic left temporoparietal cortical/subcortical infarct with a small amount of chronic hemosiderin deposition at this site. Ex vacuo dilatation of the left lateral ventricle. Stable moderate patchy T2/FLAIR hyperintensity within the cerebral white matter which is nonspecific, but consistent with chronic small vessel ischemic disease. Similar appearance of scattered supratentorial and infratentorial chronic microhemorrhages. Stable, mild generalized parenchymal atrophy. No extra-axial fluid collection. Vascular: Expected proximal arterial flow voids. Skull and upper cervical spine: No focal marrow lesion. Sinuses/Orbits: Visualized orbits show no acute finding. Mild ethmoid sinus mucosal thickening. Bilateral mastoid effusions (larger on the left). MRA HEAD FINDINGS The examination is moderately motion degraded which limits evaluation for stenoses and small aneurysms. Most notably, this limits evaluation  for stenoses within  the M2 and more distal MCA branch vessels bilaterally. This also limits evaluation for stenoses within the distal A2 and more distal anterior cerebral arteries. The intracranial internal carotid arteries are patent without high-grade stenosis. The M1 middle cerebral arteries are patent without high-grade stenosis. No acute proximal M2 branch occlusion is identified. The intracranial vertebral arteries are patent without high-grade stenosis, as is the basilar artery. The posterior cerebral arteries are patent proximally without high-grade stenosis. Predominantly fetal origin of the right posterior cerebral artery. No intracranial aneurysm or vascular malformation is identified. IMPRESSION: MRI brain: 1. Unchanged size of a 2.6 cm acute/early subacute parenchymal hemorrhage centered within the right thalamocapsular junction. There is a 2 mm round focus of enhancement within the ventromedial aspect of the hemorrhage. This may reflect vascular enhancement. However, short-interval 4-6 week contrast-enhanced MRI follow-up is recommended to exclude an underlying enhancing lesion at this site. 2. Redemonstrated chronic left temporoparietal cortical/subcortical infarct. 3. Stable mild generalized parenchymal atrophy and moderate chronic small vessel ischemic disease. 4. Scattered supratentorial and infratentorial chronic microhemorrhages, which likely reflect sequela of chronic hypertensive microangiopathy. Superimposed cerebral amyloid angiopathy is difficult to exclude. 5. Mild ethmoid sinus mucosal thickening. 6. Left greater than right mastoid effusions. MRA head: 1. Moderately motion degraded examination, limiting evaluation. 2. No intracranial large vessel occlusion. 3. No acute medium-sized vessel occlusion is identified. 4. No intracranial aneurysm or vascular malformation is identified. Electronically Signed   By: Jackey Loge DO   On: 07/20/2019 17:19   DG Swallowing Func-Speech Pathology  Result Date:  07/21/2019 Objective Swallowing Evaluation: Type of Study: MBS-Modified Barium Swallow Study  Patient Details Name: [name] Cruz MRN: [id number] Date of Birth: 22-Dec-1955 Today's Date: 07/21/2019 Time: SLP Start Time (ACUTE ONLY): 1310 -SLP Stop Time (ACUTE ONLY): 1334 SLP Time Calculation (min) (ACUTE ONLY): 24 min Past Medical History: Past Medical History: Diagnosis Date . Acute deep vein thrombosis (DVT) of left upper extremity (HCC)   L brachial and basilic veins, eliquis started 08/22/2017 to continue for 3 months total  . Cognitive impairment 2007  after stroke, saw rehab but told to stop because was too upsetting to him . History of chicken pox  . HLD (hyperlipidemia)  . HTN (hypertension)  . NSTEMI (non-ST elevated myocardial infarction) (HCC) 02/21/2017 . Obesity  . Stroke, hemorrhagic (HCC) 2007  thought 2/2 HTN (240sbp); residual cognitive impairment, loss of R peripheral field, no driving Past Surgical History: Past Surgical History: Procedure Laterality Date . ANKLE SURGERY  1990s  right foot with plate and screws . CORONARY ARTERY BYPASS GRAFT N/A 02/24/2017  3v Procedure: CORONARY ARTERY BYPASS GRAFTING (CABG) x 3 ON PUMP USING LEFT INTERNAL MAMMARY ARTERY TO LEFT ANTERIOR DESENDING CORNARY ARTERY, RIGHT GREATER SAPHENOUS VEIN TO LEFT CIRCUMFLEX ARTERY AND POSTERIOR DESENDING ARTERY. RIGHT GREATER SAPHENOUS VEIN OBTAINED VIA ENDOVEIN HARVEST.;  Surgeon: Delight Ovens, MD . IABP INSERTION N/A 02/21/2017  Procedure: IABP Insertion;  Surgeon: Yvonne Kendall, MD;  Location: [facility]IVE [facility];  Service: Cardiovascular;  Laterality: N/A; . LEFT HEART CATH AND CORONARY ANGIOGRAPHY N/A 02/21/2017  Procedure: LEFT HEART CATH AND CORONARY ANGIOGRAPHY;  Surgeon: Yvonne Kendall, MD;  Location: [facility];  Service: Cardiovascular;  Laterality: N/A; . TEE WITHOUT CARDIOVERSION N/A 02/24/2017  Procedure: TRANSESOPHAGEAL ECHOCARDIOGRAM (TEE);  Surgeon: Delight Ovens, MD;  Location: [facility] OR;  Service: Open  Heart Surgery;  Laterality: N/A; HPI: Pt is a 64 yo male presenting with sudden onset L sided weakness. CT showed  R thalamic hemorrhage. PMH: DVT, hemorrhagic stroke with residual cognitive-linguistic impairment, HTN, HLD, seizures, NSTEMI  Subjective: alert, cooperative, limited historian Assessment / Plan / Recommendation CHL IP CLINICAL IMPRESSIONS 07/21/2019 Clinical Impression Pt presents with a moderate oropharyngeal dysphagia. He has limited and slow lingual movement, resulting in reduced lingual control for containment and posterior propulsion. Mastication is disorganized, but overall there is mild oral residue and minimal anterior spillage. Swallow triggers primarily at the pyriform sinuses with thin liquids spilling over into the airway silently and in small amounts before the swallow is initiated. He also has reduced pharyngeal squeeze, base of tongue retraction, and anterior hyolaryngeal movement that leaves mild residue in the pyriform sinuses. With residue already present int he pharynx, straw sips of nectar thick liquids are also silent aspirated before the swallow. A chin tuck does not improve his airway protection and he needs moderate amounts of cueing to implement it, making it an ineffective strategy. He had improved ariway protection with thin liquids when cued to take a small sip, hold it anteriorly in his mouth, and then swallow. Although it was still triggered at the pyriform sinuses, he had improving coordination and smaller boluses sizes that could be better contained. Recommend keeping him on Dys 3 diet and nectar thick liquids with SLP f/u to facilitate strategy use with therapeutic trials of thin liquids and potential exercises.  SLP Visit Diagnosis Dysphagia, oropharyngeal phase (R13.12) Attention and concentration deficit following -- Frontal lobe and executive function deficit following -- Impact on safety and function Mild aspiration risk;Moderate aspiration risk   CHL IP TREATMENT  RECOMMENDATION 07/21/2019 Treatment Recommendations Therapy as outlined in treatment plan below   Prognosis 07/21/2019 Prognosis for Safe Diet Advancement Good Barriers to Reach Goals Cognitive deficits;Language deficits Barriers/Prognosis Comment -- CHL IP DIET RECOMMENDATION 07/21/2019 SLP Diet Recommendations Dysphagia 3 (Mech soft) solids;Nectar thick liquid Liquid Administration via Cup;No straw Medication Administration Whole meds with puree Compensations Slow rate;Small sips/bites;Monitor for anterior loss Postural Changes Seated upright at 90 degrees   CHL IP OTHER RECOMMENDATIONS 07/21/2019 Recommended Consults -- Oral Care Recommendations Oral care BID Other Recommendations Order thickener from pharmacy;Prohibited food (jello, ice cream, thin soups);Remove water pitcher   CHL IP FOLLOW UP RECOMMENDATIONS 07/21/2019 Follow up Recommendations Inpatient Rehab   CHL IP FREQUENCY AND DURATION 07/21/2019 Speech Therapy Frequency (ACUTE ONLY) min 2x/week Treatment Duration 2 weeks      CHL IP ORAL PHASE 07/21/2019 Oral Phase Impaired Oral - Pudding Teaspoon -- Oral - Pudding Cup -- Oral - Honey Teaspoon -- Oral - Honey Cup -- Oral - Nectar Teaspoon -- Oral - Nectar Cup Weak lingual manipulation;Reduced posterior propulsion;Lingual/palatal residue;Decreased bolus cohesion;Delayed oral transit Oral - Nectar Straw Weak lingual manipulation;Reduced posterior propulsion;Lingual/palatal residue;Decreased bolus cohesion;Delayed oral transit Oral - Thin Teaspoon -- Oral - Thin Cup Weak lingual manipulation;Reduced posterior propulsion;Lingual/palatal residue;Decreased bolus cohesion;Delayed oral transit Oral - Thin Straw Weak lingual manipulation;Reduced posterior propulsion;Lingual/palatal residue;Decreased bolus cohesion;Delayed oral transit Oral - Puree Weak lingual manipulation;Reduced posterior propulsion;Lingual/palatal residue;Decreased bolus cohesion;Delayed oral transit Oral - Mech Soft Weak lingual manipulation;Reduced  posterior propulsion;Lingual/palatal residue;Decreased bolus cohesion;Delayed oral transit;Impaired mastication Oral - Regular -- Oral - Multi-Consistency -- Oral - Pill -- Oral Phase - Comment --  CHL IP PHARYNGEAL PHASE 07/21/2019 Pharyngeal Phase Impaired Pharyngeal- Pudding Teaspoon -- Pharyngeal -- Pharyngeal- Pudding Cup -- Pharyngeal -- Pharyngeal- Honey Teaspoon -- Pharyngeal -- Pharyngeal- Honey Cup -- Pharyngeal -- Pharyngeal- Nectar Teaspoon -- Pharyngeal -- Pharyngeal- Nectar Cup Delayed swallow initiation-pyriform sinuses;Reduced pharyngeal peristalsis;Reduced anterior  laryngeal mobility;Reduced airway/laryngeal closure;Reduced tongue base retraction;Pharyngeal residue - pyriform Pharyngeal -- Pharyngeal- Nectar Straw Delayed swallow initiation-pyriform sinuses;Reduced pharyngeal peristalsis;Reduced anterior laryngeal mobility;Reduced airway/laryngeal closure;Reduced tongue base retraction;Pharyngeal residue - pyriform;Penetration/Aspiration before swallow Pharyngeal Material enters airway, passes BELOW cords without attempt by patient to eject out (silent aspiration) Pharyngeal- Thin Teaspoon -- Pharyngeal -- Pharyngeal- Thin Cup Delayed swallow initiation-pyriform sinuses;Reduced pharyngeal peristalsis;Reduced anterior laryngeal mobility;Reduced airway/laryngeal closure;Reduced tongue base retraction;Pharyngeal residue - pyriform;Penetration/Aspiration before swallow Pharyngeal Material enters airway, passes BELOW cords without attempt by patient to eject out (silent aspiration) Pharyngeal- Thin Straw Delayed swallow initiation-pyriform sinuses;Reduced pharyngeal peristalsis;Reduced anterior laryngeal mobility;Reduced airway/laryngeal closure;Reduced tongue base retraction;Pharyngeal residue - pyriform;Penetration/Aspiration before swallow Pharyngeal Material enters airway, passes BELOW cords without attempt by patient to eject out (silent aspiration) Pharyngeal- Puree Delayed swallow  initiation-pyriform sinuses;Reduced pharyngeal peristalsis;Reduced anterior laryngeal mobility;Reduced airway/laryngeal closure;Reduced tongue base retraction;Pharyngeal residue - pyriform Pharyngeal -- Pharyngeal- Mechanical Soft Delayed swallow initiation-pyriform sinuses;Reduced pharyngeal peristalsis;Reduced anterior laryngeal mobility;Reduced airway/laryngeal closure;Reduced tongue base retraction;Pharyngeal residue - pyriform Pharyngeal -- Pharyngeal- Regular -- Pharyngeal -- Pharyngeal- Multi-consistency -- Pharyngeal -- Pharyngeal- Pill -- Pharyngeal -- Pharyngeal Comment --  CHL IP CERVICAL ESOPHAGEAL PHASE 07/21/2019 Cervical Esophageal Phase WFL Pudding Teaspoon -- Pudding Cup -- Honey Teaspoon -- Honey Cup -- Nectar Teaspoon -- Nectar Cup -- Nectar Straw -- Thin Teaspoon -- Thin Cup -- Thin Straw -- Puree -- Mechanical Soft -- Regular -- Multi-consistency -- Pill -- Cervical Esophageal Comment -- Mahala Menghini., M.A. CCC-SLP Acute Rehabilitation Services Pager [phone] Office [phone] 07/21/2019, 2:17 PM              ECHOCARDIOGRAM COMPLETE  Result Date: 07/21/2019    ECHOCARDIOGRAM REPORT   Patient Name:   [name] Cruz Date of Exam: 07/21/2019 Medical Rec #:  [id number]      Height:       63.0 in Accession #:    [id number]     Weight:       186.3 lb Date of Birth:  05/28/1955      BSA:          1.876 m Patient Age:    64 years       BP:           80/62 mmHg Patient Gender: M              HR:           62 bpm. Exam Location:  Inpatient Procedure: 2D Echo, Cardiac Doppler and Color Doppler Indications:    Stroke 434.91 / I163.9  History:        Patient has prior history of Echocardiogram examinations, most                 recent 07/09/2018. CAD, Stroke, Arrythmias:Cardiac Arrest, Atrial                 Fibrillation and non-specific ST changes; Risk                 Factors:Hypertension, Dyslipidemia and Non-Smoker. PAD.  Sonographer:    Renella Cunas RDCS Referring Phys: [id number] SHARON L BIBY  Sonographer  Comments: Image acquisition challenging due to respiratory motion. IMPRESSIONS  1. Left ventricular ejection fraction, by estimation, is 55 to 60%. The left ventricle has normal function. The left ventricle has no regional wall motion abnormalities. Left ventricular diastolic parameters were normal.  2. Right ventricular systolic function is normal. The right ventricular size is normal. There is normal pulmonary artery systolic pressure.  3. The mitral valve is normal in structure. Trivial mitral valve regurgitation. No evidence of mitral stenosis.  4. The aortic valve is normal in structure. Aortic valve regurgitation is not visualized. No aortic stenosis is present.  5. The inferior vena cava is normal in size with greater than 50% respiratory variability, suggesting right atrial pressure of 3 mmHg. FINDINGS  Left Ventricle: Left ventricular ejection fraction, by estimation, is 55 to 60%. The left ventricle has normal function. The left ventricle has no regional wall motion abnormalities. The left ventricular internal cavity size was normal in size. There is  no left ventricular hypertrophy. Left ventricular diastolic parameters were normal. Right Ventricle: The right ventricular size is normal. No increase in right ventricular wall thickness. Right ventricular systolic function is normal. There is normal pulmonary artery systolic pressure. The tricuspid regurgitant velocity is 1.73 m/s, and  with an assumed right atrial pressure of 3 mmHg, the estimated right ventricular systolic pressure is 15.0 mmHg. Left Atrium: Left atrial size was normal in size. Right Atrium: Right atrial size was normal in size. Pericardium: There is no evidence of pericardial effusion. Mitral Valve: The mitral valve is normal in structure. There is mild thickening of the mitral valve leaflet(s). There is mild calcification of the mitral valve leaflet(s). Normal mobility of the mitral valve leaflets. Trivial mitral valve regurgitation. No  evidence of mitral valve stenosis. Tricuspid Valve: The tricuspid valve is normal in structure. Tricuspid valve regurgitation is trivial. No evidence of tricuspid stenosis. Aortic Valve: The aortic valve is normal in structure. Aortic valve regurgitation is not visualized. No aortic stenosis is present. Pulmonic Valve: The pulmonic valve was normal in structure. Pulmonic valve regurgitation is not visualized. No evidence of pulmonic stenosis. Aorta: The aortic root is normal in size and structure. Venous: The inferior vena cava is normal in size with greater than 50% respiratory variability, suggesting right atrial pressure of 3 mmHg. IAS/Shunts: No atrial level shunt detected by color flow Doppler.  LEFT VENTRICLE PLAX 2D LVIDd:         4.80 cm     Diastology LVIDs:         3.40 cm     LV e' lateral:   11.50 cm/s LV PW:         0.90 cm     LV E/e' lateral: 4.7 LV IVS:        0.90 cm     LV e' medial:    8.27 cm/s LVOT diam:     2.20 cm     LV E/e' medial:  6.5 LV SV:         62 LV SV Index:   33 LVOT Area:     3.80 cm  LV Volumes (MOD) LV vol d, MOD A2C: 96.0 ml LV vol d, MOD A4C: 90.7 ml LV vol s, MOD A2C: 48.1 ml LV vol s, MOD A4C: 38.3 ml LV SV MOD A2C:     47.9 ml LV SV MOD A4C:     90.7 ml LV SV MOD BP:      50.6 ml RIGHT VENTRICLE RV S prime:     10.10 cm/s TAPSE (M-mode): 1.2 cm LEFT ATRIUM             Index       RIGHT ATRIUM           Index LA diam:        3.60 cm 1.92 cm/m  RA Area:     11.00  cm LA Vol (A2C):   34.2 ml 18.23 ml/m RA Volume:   22.40 ml  11.94 ml/m LA Vol (A4C):   27.0 ml 14.39 ml/m LA Biplane Vol: 31.4 ml 16.74 ml/m  AORTIC VALVE LVOT Vmax:   79.70 cm/s LVOT Vmean:  55.600 cm/s LVOT VTI:    0.162 m  AORTA Ao Root diam: 3.50 cm MITRAL VALVE               TRICUSPID VALVE MV Area (PHT): 4.49 cm    TR Peak grad:   12.0 mmHg MV Decel Time: 169 msec    TR Vmax:        173.00 cm/s MV E velocity: 53.60 cm/s MV A velocity: 31.20 cm/s  SHUNTS MV E/A ratio:  1.72        Systemic VTI:  0.16 m                             Systemic Diam: 2.20 cm Charlton Haws MD Electronically signed by Charlton Haws MD Signature Date/Time: 07/21/2019/11:23:56 AM    Final    VAS US CAROTID  Result Date: 07/21/2019 Carotid Arterial Duplex Study Indications:       CVA. Risk Factors:      Hypertension, hyperlipidemia. Comparison Study:  No prior studies. Performing Technologist: Chanda Busing RVT  Examination Guidelines: A complete evaluation includes B-mode imaging, spectral Doppler, color Doppler, and power Doppler as needed of all accessible portions of each vessel. Bilateral testing is considered an integral part of a complete examination. Limited examinations for reoccurring indications may be performed as noted.  Right Carotid Findings: +----------+--------+--------+--------+-----------------------+--------+           PSV cm/sEDV cm/sStenosisPlaque Description     Comments +----------+--------+--------+--------+-----------------------+--------+ CCA Prox  72      21              smooth and heterogenoustortuous +----------+--------+--------+--------+-----------------------+--------+ CCA Distal66      18                                              +----------+--------+--------+--------+-----------------------+--------+ ICA Prox  110     34              calcific                        +----------+--------+--------+--------+-----------------------+--------+ ICA Distal83      33                                     tortuous +----------+--------+--------+--------+-----------------------+--------+ ECA       50      8                                               +----------+--------+--------+--------+-----------------------+--------+ +----------+--------+-------+--------+-------------------+           PSV cm/sEDV cmsDescribeArm Pressure (mmHG) +----------+--------+-------+--------+-------------------+ Subclavian119                                         +----------+--------+-------+--------+-------------------+ +---------+--------+--+--------+--+---------+  VertebralPSV cm/s39EDV cm/s11Antegrade +---------+--------+--+--------+--+---------+  Left Carotid Findings: +----------+--------+--------+--------+-----------------------+--------+           PSV cm/sEDV cm/sStenosisPlaque Description     Comments +----------+--------+--------+--------+-----------------------+--------+ CCA Prox  86      21              smooth and heterogenous         +----------+--------+--------+--------+-----------------------+--------+ CCA Distal67      18              calcific                        +----------+--------+--------+--------+-----------------------+--------+ ICA Prox  92      27              calcific               tortuous +----------+--------+--------+--------+-----------------------+--------+ ICA Distal97      31                                     tortuous +----------+--------+--------+--------+-----------------------+--------+ ECA       65      9                                               +----------+--------+--------+--------+-----------------------+--------+ +----------+--------+--------+--------+-------------------+           PSV cm/sEDV cm/sDescribeArm Pressure (mmHG) +----------+--------+--------+--------+-------------------+ [id number]                                          +----------+--------+--------+--------+-------------------+ +---------+--------+--+--------+--+---------+ VertebralPSV cm/s72EDV cm/s20Antegrade +---------+--------+--+--------+--+---------+   Summary: Right Carotid: Velocities in the right ICA are consistent with a 1-39% stenosis. Left Carotid: Velocities in the left ICA are consistent with a 1-39% stenosis. Vertebrals: Bilateral vertebral arteries demonstrate antegrade flow. *See table(s) above for measurements and observations.     Preliminary     PHYSICAL EXAM Pleasant  middle-aged Caucasian male not in distress. . Afebrile. Head is nontraumatic. Neck is supple without bruit.    Cardiac exam no murmur or gallop. Lungs are clear to auscultation. Distal pulses are well felt. Neurological Exam :  Awake alert oriented to time place and person.  No aphasia or apraxia but has mild dysarthria.  Extraocular movements full range without nystagmus.  Blinks to threat bilaterally.  Left lower facial weakness.  Tongue midline.  Motor system exam shows left hemiplegia with left upper extremity 0/5 and left lower extremity 2/5 strength.  Normal strength on the right side.  Decreased left hemibody sensation.  Left plantar upgoing right downgoing.  Gait not tested.  ASSESSMENT/PLAN [name] is a 64 y.o. male with history of deep vein thrombosis previously on Eliquis, history of hemorrhagic stroke secondary to hypertension with blood pressure 240 systolic, hypertension, hyperlipidemia presenting with L sided weakness.   Stroke:   R thalamocapsular hemorrhage - 2 separate lesions w/o significant HTN - concern for lesion  Code Stroke CT head R thalamocapsular hemorrhage w/ mild edema. Old L tempororparietal infarct. Small vessel disease. Atrophy. Sinus dz.  MRI w/w/o stable 2.6 cm right coronary radiata subcortical hemorrhage with a small focus of enhancement in the ventral medial aspect of the hemorrhage.  MRA  no large vessel stenosis or occlusion.  Carotid Doppler no significant extracranial stenosis.  2D Echo normal ejection fraction.  No cardiac source of embolism.  Urine drug screen negative  LDL 78  HgbA1c 5.6  SCDs for VTE prophylaxis  aspirin 81 mg daily prior to admission, now on No antithrombotic given hmg  Therapy recommendations:  CIR  Disposition:  pending   Monitor acute hemorrhage in ICU x 24h  Atrial Fibrillation  Home anticoagulation:  none   As per cardiology note, patient has PAF  Not on [facility] due to history of left MCA infarct with  hemorrhagic conversion  Stroke in 06/2018 likely small vessel disease.  However, patient need to close follow-up with Dr. Pearlean Brownie to consider anticoagulation for stroke prevention if felt appropriate.   Hypertension  BP 158/88 on arrival  Initially treated w/ cleviprex, now off  Home meds:  Coreg 3.125 bid  Stable . Resume coreg . SBP goal < 140 x 24h . Long-term BP goal normotensive  Hyperlipidemia  Home meds:  lipitor 80  Hold statin in setting of acute hemorrhage  LDL 78  Plan to continue statin at discharge  Other Stroke Risk Factors  Hx Cigarette smoker  Hx ETOH use  Obesity, Body mass index is 33 kg/m., recommend weight loss, diet and exercise as appropriate   Hx stroke/TIA   07/2018 - punctate acute/subacute white matter infarct involving right forceps major, right PCA territory, likely due to small vessel disease. asa & plavix x 3 wks then plavix alone.   06/2018 incidental right cerebellum infarct secondary to small vessel disease.  CT head and neck right ICA 60% stenosis.  Left ICA less than 50% stenosis.  LDL 69 and A1c 5.6.  Increase aspirin from 81 to 325.  Lipitor 80 continued.  2004 - Left MCA infarct with hemorrhagic conversion   Coronary artery disease s/p CABG  Hx UE DVT  Other Active Problems  Baseline cognitive impairment  Seizures on vimpat, keppra and depakote. Last admitted 06/2019. Felt to originate from L MCA infarct in 2004  Hospital day # 2  Plan change systolic blood pressure goal to below 160.  Start oral medications and use as needed IV and discontinue Cleviprex drip.  Mobilize out of bed.  Therapy consults.  Transfer to neurology floor bed when available.  Likely need inpatient rehab.  Long discussion with patient and answered questions.  Greater than 50% time during this 35-minute visit was spent on counseling and coordination of care and discussion with care team.   Delia Heady, MD  To contact Stroke Continuity provider, please  refer to [email or web address]. After hours, contact General Neurology

## 2019-07-21 NOTE — Progress Notes (Addendum)
Pt arrived to unit, VS stable

## 2019-07-21 NOTE — Progress Notes (Signed)
Carotid artery duplex has been completed. Preliminary results can be found in CV Proc through chart review.   07/21/19 12:58 PM Olen Cordial RVT

## 2019-07-21 NOTE — Progress Notes (Signed)
  Echocardiogram 2D Echocardiogram has been performed.  Burnard Hawthorne 07/21/2019, 10:43 AM

## 2019-07-21 NOTE — Progress Notes (Signed)
  Speech Language Pathology Treatment: Dysphagia  Patient Details Name: [name] Cruz MRN: [id number] DOB: October 07, 1955 Today's Date: 07/21/2019 Time: 6948-5462 SLP Time Calculation (min) (ACUTE ONLY): 17 min  Assessment / Plan / Recommendation Clinical Impression  Pt was drowsy during breakfast this morning, needing cueing for initiation and sustained attention to self-feeding. He had slow but appropriate oral preparation without buccal pocketing with soft solids. No overt s/s of aspiration were noted with current diet textures, although even small amounts of intake took a prolonged time. Suspect this will improve as he becomes more alert. SLP also provided advanced trials of thin liquids that continue to elicit an intermittent cough. Recommend proceeding with MBS, but will plan for later today since he is drowsy this morning.    HPI HPI: Pt is a 64 yo male presenting with sudden onset L sided weakness. CT showed R thalamic hemorrhage. PMH: DVT, hemorrhagic stroke with residual cognitive-linguistic impairment, HTN, HLD, seizures, NSTEMI      SLP Plan  MBS       Recommendations  Diet recommendations: Dysphagia 3 (mechanical soft);Nectar-thick liquid Liquids provided via: Cup;No straw Medication Administration: Whole meds with puree Supervision: Staff to assist with self feeding Compensations: Slow rate;Small sips/bites;Monitor for anterior loss Postural Changes and/or Swallow Maneuvers: Seated upright 90 degrees                Oral Care Recommendations: Oral care BID Follow up Recommendations: Inpatient Rehab SLP Visit Diagnosis: Cognitive communication deficit (R41.841);Dysarthria and anarthria (R47.1) Plan: MBS       GO                Mahala Menghini., M.A. CCC-SLP Acute Rehabilitation Services Pager [phone] Office [phone]  07/21/2019, 9:27 AM

## 2019-07-22 DIAGNOSIS — I610 Nontraumatic intracerebral hemorrhage in hemisphere, subcortical: Secondary | ICD-10-CM

## 2019-07-22 LAB — BASIC METABOLIC PANEL
Anion gap: 10 (ref 5–15)
BUN: 9 mg/dL (ref 8–23)
CO2: 25 mmol/L (ref 22–32)
Calcium: 8.5 mg/dL — ABNORMAL LOW (ref 8.9–10.3)
Chloride: 99 mmol/L (ref 98–111)
Creatinine, Ser: 0.76 mg/dL (ref 0.61–1.24)
GFR calc Af Amer: >60 mL/min (ref >60)
GFR calc non Af Amer: >60 mL/min (ref >60)
Glucose, Bld: 97 mg/dL (ref 70–99)
Potassium: 3.6 mmol/L (ref 3.5–5.1)
Sodium: 134 mmol/L — ABNORMAL LOW (ref 135–145)

## 2019-07-22 LAB — CBC
HCT: 34.7 % — ABNORMAL LOW (ref 39.0–52.0)
Hemoglobin: 11.7 g/dL — ABNORMAL LOW (ref 13.0–17.0)
MCH: 31 pg (ref 26.0–34.0)
MCHC: 33.7 g/dL (ref 30.0–36.0)
MCV: 91.8 fL (ref 80.0–100.0)
Platelets: 202 10*3/uL (ref 150–400)
RBC: 3.78 MIL/uL — ABNORMAL LOW (ref 4.22–5.81)
RDW: 12.5 % (ref 11.5–15.5)
WBC: 8.5 10*3/uL (ref 4.0–10.5)
nRBC: 0 % (ref 0.0–0.2)

## 2019-07-22 NOTE — Progress Notes (Signed)
STROKE TEAM PROGRESS NOTE   INTERVAL HISTORY Patient is lying in bed comfortably.  He still has left appears to be improving.  He was seen by therapist who recommended inpatient rehab and is medically stable to be transferred there but awaiting insurance approval and bed availability.  No complaints today.  Vital signs stable. Vitals:   07/22/19 0317 07/22/19 0749 07/22/19 1208 07/22/19 1635  BP: 95/63 113/77 112/74 119/79  Pulse: 79 75 74 65  Resp: 17 16 16 18   Temp: 99.2 F (37.3 C) 98.2 F (36.8 C) 99 F (37.2 C) 98.4 F (36.9 C)  TempSrc: Oral Oral Oral Oral  SpO2: 95% 94% 94% 97%  Weight:        CBC:  Recent Labs  Lab 07/19/19 1900 07/19/19 1900 07/19/19 1908 07/22/19 0419  WBC 7.2  --   --  8.5  NEUTROABS 4.2  --   --   --   HGB 12.2*   < > 12.2* 11.7*  HCT 37.4*   < > 36.0* 34.7*  MCV 94.7  --   --  91.8  PLT 228  --   --  202   < > = values in this interval not displayed.    Basic Metabolic Panel:  Recent Labs  Lab 07/19/19 1900 07/19/19 1900 07/19/19 1908 07/22/19 0419  NA 132*   < > 135 134*  K 3.8   < > 3.8 3.6  CL 99   < > 98 99  CO2 24  --   --  25  GLUCOSE 101*   < > 98 97  BUN <5*   < > 4* 9  CREATININE 0.71   < > 0.70 0.76  CALCIUM 8.3*  --   --  8.5*   < > = values in this interval not displayed.   Lipid Panel:     Component Value Date/Time   CHOL 143 07/14/2019 1207   CHOL 177 07/11/2017 0950   TRIG 148.0 07/14/2019 1207   HDL 35.90 (L) 07/14/2019 1207   HDL 96 07/11/2017 0950   CHOLHDL 4 07/14/2019 1207   VLDL 29.6 07/14/2019 1207   LDLCALC 78 07/14/2019 1207   LDLCALC 65 07/11/2017 0950   HgbA1c:  Lab Results  Component Value Date   HGBA1C 5.6 07/14/2019   Urine Drug Screen:     Component Value Date/Time   LABOPIA NONE DETECTED 07/20/2019 0005   COCAINSCRNUR NONE DETECTED 07/20/2019 0005   LABBENZ NONE DETECTED 07/20/2019 0005   AMPHETMU NONE DETECTED 07/20/2019 0005   THCU NONE DETECTED 07/20/2019 0005   LABBARB NONE  DETECTED 07/20/2019 0005    Alcohol Level     Component Value Date/Time   ETH <10 07/19/2019 1900    IMAGING past 24 hours No results found.  PHYSICAL EXAM Pleasant middle-aged Caucasian male not in distress. . Afebrile. Head is nontraumatic. Neck is supple without bruit.    Cardiac exam no murmur or gallop. Lungs are clear to auscultation. Distal pulses are well felt. Neurological Exam :  Awake alert oriented to time place and person.  No aphasia or apraxia but has mild dysarthria.  Extraocular movements full range without nystagmus.  Blinks to threat bilaterally.  Left lower facial weakness.  Tongue midline.  Motor system exam shows left hemiplegia with left upper extremity 0/5 and left lower extremity 3/5 strength.  Normal strength on the right side.  Decreased left hemibody sensation.  Left plantar upgoing right downgoing.  Gait not tested.  ASSESSMENT/PLAN Mr.  [name] Cruz is a 64 y.o. male with history of deep vein thrombosis previously on Eliquis, history of hemorrhagic stroke secondary to hypertension with blood pressure 240 systolic, hypertension, hyperlipidemia presenting with L sided weakness.   Stroke:   R thalamocapsular hemorrhage - 2 separate lesions w/o significant HTN - concern for lesion  Code Stroke CT head R thalamocapsular hemorrhage w/ mild edema. Old L tempororparietal infarct. Small vessel disease. Atrophy. Sinus dz.  MRI w/w/o stable 2.6 cm right coronary radiata subcortical hemorrhage with a small focus of enhancement in the ventral medial aspect of the hemorrhage.  MRA no large vessel stenosis or occlusion.  Carotid Doppler no significant extracranial stenosis.  2D Echo normal ejection fraction.  No cardiac source of embolism.  Urine drug screen negative  LDL 78  HgbA1c 5.6  SCDs for VTE prophylaxis  aspirin 81 mg daily prior to admission, now on No antithrombotic given hmg  Therapy recommendations:  CIR  Disposition:  pending   Monitor acute  hemorrhage in ICU x 24h  Atrial Fibrillation  Home anticoagulation:  none   As per cardiology note, patient has PAF  Not on [facility] due to history of left MCA infarct with hemorrhagic conversion  Stroke in 06/2018 likely small vessel disease.  However, patient need to close follow-up with Dr. Pearlean Brownie to consider anticoagulation for stroke prevention if felt appropriate.   Hypertension  BP 158/88 on arrival  Initially treated w/ cleviprex, now off  Home meds:  Coreg 3.125 bid  Stable . Resume coreg . SBP goal < 140 x 24h . Long-term BP goal normotensive  Hyperlipidemia  Home meds:  lipitor 80  Hold statin in setting of acute hemorrhage  LDL 78  Plan to continue statin at discharge  Other Stroke Risk Factors  Hx Cigarette smoker  Hx ETOH use  Obesity, Body mass index is 33 kg/m., recommend weight loss, diet and exercise as appropriate   Hx stroke/TIA   07/2018 - punctate acute/subacute white matter infarct involving right forceps major, right PCA territory, likely due to small vessel disease. asa & plavix x 3 wks then plavix alone.   06/2018 incidental right cerebellum infarct secondary to small vessel disease.  CT head and neck right ICA 60% stenosis.  Left ICA less than 50% stenosis.  LDL 69 and A1c 5.6.  Increase aspirin from 81 to 325.  Lipitor 80 continued.  2004 - Left MCA infarct with hemorrhagic conversion   Coronary artery disease s/p CABG  Hx UE DVT  Other Active Problems  Baseline cognitive impairment  Seizures on vimpat, keppra and depakote. Last admitted 06/2019. Felt to originate from L MCA infarct in 2004  Hospital day # 3  Plan continue ongoing therapies and transfer to inpatient rehab when bed available. Delia Heady, MD  To contact Stroke Continuity provider, please refer to [email or web address]. After hours, contact General Neurology

## 2019-07-22 NOTE — Progress Notes (Signed)
Physical Therapy Treatment Patient Details Name: [name] Cruz MRN: [id number] DOB: [birth year] Today's Date: 07/22/2019    History of Present Illness 64 y.o. male with past medical history of deep vein thrombosis previously on Eliquis, history of hemorrhagic stroke secondary to hypertension with blood pressure of 240 systolic, hypertension, hyperlipidemia presents to the emergency department with sudden onset left-sided weakness. NIHSS 11. CT - Acute hemorrhage R thalamocapsular junction; mild associated edema     PT Comments    Max assist for supine to sit, mod/max assist for sitting balance 2* leaning to L, R, and posteriorly. Pt showed no protective nor righting responses to loss of balance, seemed to have no awareness of potential to fall. Pt lethargic during session, slow to respond to questions. Oriented to self and location, not to month nor to year.    Follow Up Recommendations  CIR     Equipment Recommendations  Wheelchair (measurements PT);Wheelchair cushion (measurements PT);3in1 (PT);Hospital bed    Recommendations for Other Services Rehab consult     Precautions / Restrictions Precautions Precautions: Fall Precaution Comments: left sided weakness and inattention Restrictions Weight Bearing Restrictions: No    Mobility  Bed Mobility Overal bed mobility: Needs Assistance Bed Mobility: Supine to Sit     Supine to sit: Max assist;HOB elevated Sit to supine: Total assist   General bed mobility comments: max A to raise trunk and advance LLE (no initiation of movement in LLE noted), total A for sit to supine; pt sat EOB x 5 minutes with loss of balance L/R and posteriorly requiring mod/max A to recover, no righting/protective responses  Transfers                 General transfer comment: didn't attempt 2* poor sitting balance and pt lethargy  Ambulation/Gait                 Stairs             Wheelchair Mobility    Modified Rankin (Stroke  Patients Only)       Balance Overall balance assessment: Needs assistance Sitting-balance support: Feet supported;Single extremity supported Sitting balance-Leahy Scale: Poor Sitting balance - Comments: at edge of bed pt had L, R and posterior lean requiring mod to max assist to correct, he did not respond to verbal cues to correct leaning, no protective/righting reactions, sat 5 minutes EOB Postural control: Right lateral lean;Left lateral lean;Posterior lean                                  Cognition Arousal/Alertness: Awake/alert Behavior During Therapy: WFL for tasks assessed/performed Overall Cognitive Status: Impaired/Different from baseline Area of Impairment: Attention;Memory;Following commands;Safety/judgement;Awareness;Problem solving;Orientation                 Orientation Level: Time Current Attention Level: Sustained (needs redirection to task frequently) Memory: Decreased short-term memory ("did I fall" asking his sister re: what happened) Following Commands: Follows one step commands inconsistently;Follows one step commands with increased time Safety/Judgement: Decreased awareness of safety;Decreased awareness of deficits Awareness: Intellectual Problem Solving: Slow processing;Difficulty sequencing;Requires verbal cues;Requires tactile cues General Comments: when asked if pt needed assist to sit up from supine, he stated "no", but needed max A to sit. No protective/righting responses to loss of balance in sitting. Oriented to self and location, not to month/year nor to president.      Exercises      General Comments  Pertinent Vitals/Pain Pain Assessment: No/denies pain    Home Living                      Prior Function            PT Goals (current goals can now be found in the care plan section) Acute Rehab PT Goals Patient Stated Goal: to get better PT Goal Formulation: With patient Time For Goal Achievement:  08/03/19 Potential to Achieve Goals: Good Progress towards PT goals: Progressing toward goals    Frequency    Min 4X/week      PT Plan Current plan remains appropriate    Co-evaluation              AM-PAC PT "6 Clicks" Mobility   Outcome Measure  Help needed turning from your back to your side while in a flat bed without using bedrails?: A Lot Help needed moving from lying on your back to sitting on the side of a flat bed without using bedrails?: A Lot Help needed moving to and from a bed to a chair (including a wheelchair)?: Total Help needed standing up from a chair using your arms (e.g., wheelchair or bedside chair)?: Total Help needed to walk in hospital room?: Total Help needed climbing 3-5 steps with a railing? : Total 6 Click Score: 8    End of Session   Activity Tolerance: Patient limited by lethargy;No increased pain Patient left: in bed;with call bell/phone within reach;with bed alarm set Nurse Communication: Mobility status PT Visit Diagnosis: Other abnormalities of gait and mobility (R26.89);Hemiplegia and hemiparesis;Apraxia (R48.2) Hemiplegia - Right/Left: Left Hemiplegia - dominant/non-dominant: Non-dominant Hemiplegia - caused by: Nontraumatic intracerebral hemorrhage     Time: 5852-7782 PT Time Calculation (min) (ACUTE ONLY): 12 min  Charges:  $Therapeutic Activity: 8-22 mins                     Ralene Bathe Kistler PT 07/22/2019  Acute Rehabilitation Services Pager [phone] Office [phone]

## 2019-07-22 NOTE — Progress Notes (Signed)
Occupational Therapy Treatment Patient Details Name: [name] Cruz MRN: [id number] DOB: 12-31-1955 Today's Date: 07/22/2019    History of present illness 64 y.o. male with past medical history of deep vein thrombosis previously on Eliquis, history of hemorrhagic stroke secondary to hypertension with blood pressure of 240 systolic, hypertension, hyperlipidemia presents to the emergency department with sudden onset left-sided weakness. NIHSS 11. CT - Acute hemorrhage R thalamocapsular junction; mild associated edema    OT comments  Patient continues to make steady progress towards goals in skilled OT session. Patient's session encompassed therapeutic bed level exercises and neuromuscular re-education at EOB to aid in engagement of the trunk, increased attention to L and to increase endurance and activity tolerance. Pt remains oriented x3 however noted difficulty in understanding commands to complete basic exercises despite tactile cues. Pt with ability to sit EOB for 15 plus minutes with therapist, however unable to navigate outside BOS without mod-max A from therapist to correct body into appropriate position. Pt with noted trace activation in LUE (shoulder) when completing NDT techniques. Pt would continue to greatly benefit from CIR in order to address functional deficits; will continue to follow acutely.    Follow Up Recommendations  CIR;Supervision/Assistance - 24 hour    Equipment Recommendations  None recommended by OT    Recommendations for Other Services      Precautions / Restrictions Precautions Precautions: Fall Precaution Comments: left sided weakness and inattention Restrictions Weight Bearing Restrictions: No       Mobility Bed Mobility Overal bed mobility: Needs Assistance Bed Mobility: Supine to Sit     Supine to sit: Max assist;HOB elevated Sit to supine: Max assist   General bed mobility comments: Max A to come to EOB, able to complete trunk engagement and  neuromuscular re-education at EOB with with mod A, attemtped to clear hips to stand, pt unable  Transfers                 General transfer comment: Attempted to clear bottom x2 sitting EOB, unable to clear even with knee blocked    Balance Overall balance assessment: Needs assistance Sitting-balance support: Feet supported;Bilateral upper extremity supported Sitting balance-Leahy Scale: Poor Sitting balance - Comments: Required multimodal cues in order to maintain balance EOB, however will correct, pt noted with trace movement in LUE when completing various truncal engagement exercises Postural control: Right lateral lean;Left lateral lean;Posterior lean                                 ADL either performed or assessed with clinical judgement   ADL Overall ADL's : Needs assistance/impaired     Grooming: Moderate assistance;Sitting;Wash/dry hands;Wash/dry face Grooming Details (indicate cue type and reason): Noted discharge from L eye                             Functional mobility during ADLs: Moderate assistance;+2 for physical assistance;Cueing for safety;Cueing for sequencing General ADL Comments: Session focus on neuromuscular re-education and bed level exercises to aid in mobility and strength     Vision       Perception     Praxis      Cognition Arousal/Alertness: Lethargic Behavior During Therapy: [facility] for tasks assessed/performed Overall Cognitive Status: Impaired/Different from baseline Area of Impairment: Attention;Memory;Following commands;Safety/judgement;Awareness;Problem solving;Orientation                 Orientation  Level: Disoriented to;Time Current Attention Level: Sustained Memory: Decreased short-term memory Following Commands: Follows one step commands inconsistently;Follows one step commands with increased time Safety/Judgement: Decreased awareness of safety;Decreased awareness of deficits Awareness:  Intellectual Problem Solving: Slow processing;Difficulty sequencing;Requires verbal cues;Requires tactile cues;Decreased initiation General Comments: Was able to talk about his tomatoes, however requires increased time to answer further questions however often states "I understand" Pt with difficulty motor planning bed level exercises, even on unaffected side        Exercises General Exercises - Upper Extremity Shoulder Flexion: PROM;Left;10 reps Shoulder Extension: PROM;Left;10 reps Elbow Flexion: PROM;Left;10 reps Elbow Extension: PROM;Left;10 reps Wrist Flexion: PROM;Left;10 reps Wrist Extension: PROM;Left;10 reps Low Level/ICU Exercises Ankle Circles/Pumps: AAROM;Both;10 reps Stabilized Bridging: AAROM;10 reps;Both Other Exercises Other Exercises: Attempted to have pt push his knees into the bed with gloved hand as tactile cue, pt unable to motor plan even on unaffected side, often flexing quad instead   Shoulder Instructions       General Comments      Pertinent Vitals/ Pain       Pain Assessment: No/denies pain Faces Pain Scale: No hurt  Home Living                                          Prior Functioning/Environment              Frequency  Min 2X/week        Progress Toward Goals  OT Goals(current goals can now be found in the care plan section)  Progress towards OT goals: Progressing toward goals  Acute Rehab OT Goals Patient Stated Goal: to get better OT Goal Formulation: With patient Time For Goal Achievement: 08/03/19 Potential to Achieve Goals: Good  Plan Discharge plan remains appropriate    Co-evaluation                 AM-PAC OT "6 Clicks" Daily Activity     Outcome Measure   Help from another person eating meals?: A Little Help from another person taking care of personal grooming?: A Lot Help from another person toileting, which includes using toliet, bedpan, or urinal?: A Lot Help from another person bathing  (including washing, rinsing, drying)?: A Lot Help from another person to put on and taking off regular upper body clothing?: A Lot Help from another person to put on and taking off regular lower body clothing?: A Lot 6 Click Score: 13    End of Session    OT Visit Diagnosis: Unsteadiness on feet (R26.81);Other abnormalities of gait and mobility (R26.89);Muscle weakness (generalized) (M62.81);Other symptoms and signs involving cognitive function;Hemiplegia and hemiparesis Hemiplegia - Right/Left: Left Hemiplegia - dominant/non-dominant: Non-Dominant Hemiplegia - caused by: Cerebral infarction   Activity Tolerance Patient tolerated treatment well   Patient Left in bed;with call bell/phone within reach;with bed alarm set   Nurse Communication Mobility status        Time: 2956-2130 OT Time Calculation (min): 31 min  Charges: OT General Charges $OT Visit: 1 Visit OT Treatments $Neuromuscular Re-education: 8-22 mins $Therapeutic Exercise: 8-22 mins   Pollyann Glen E. Bland Rudzinski, COTA/L Acute Rehabilitation Services [phone]   Cherlyn Cushing 07/22/2019, 2:55 PM

## 2019-07-22 NOTE — Consult Note (Signed)
   [facility] Inpatient Consult   07/22/2019  [name] 1955/11/30 [id number]  [facility] [[facility]]  Accountable Care Organization [ACO] Patient:  [name]   Patient screened for less than 30 day readmission hospitalizations to check if potential [facility]  [[facility] Care Management service needs.  Review of patient's medical record reveals patient is for an inpatient rehabilitation stay noted. Continue to follow progress and disposition to assess for post hospital care management needs.    Please place a [facility] Care Management consult as appropriate for transitioning from rehab and for questions contact:   [name] Shanks, RN BSN CCM [facility]  [phone] business mobile phone Toll free office [phone]  Fax number: [phone] [email or web address]

## 2019-07-22 NOTE — H&P (Signed)
Physical Medicine and Rehabilitation Admission H&P    Chief Complaint  Patient presents with  . Code Stroke  : HPI: [name] Cruz is a 64 year old right-handed male with history of hyperlipidemia, seizure disorder maintained on Depakote and Vimpat as well as Keppra, punctate white matter infarction 07/2018, bipolar disorder maintained on Geodon, CAD with CABG 2019, hypertension as well as history of deep vein thrombosis previously on Eliquis discontinued due to hemorrhagic CVA 2007.  Per chart review lives with sister.  1 level home 3 steps to entry.  Reportedly independent prior to admission.  Presented 07/19/2018 while left-sided weakness and slurred speech.  Noted systolic blood pressure in the 240s.  Cranial CT scan showed a 1.4 x 2.0 x 2.6 cm parenchymal hemorrhage centered within the right thalamocapsular junction.  Redemonstrated a chronic left temporoparietal infarction.  Echocardiogram with ejection fraction of 60% without emboli.  No regional wall motion abnormalities.  MRI follow-up unchanged size of parenchymal hemorrhage.  Carotid Dopplers with no ICA stenosis.  Alcohol level negative, sodium 132, urine drug screen negative nitrite.  Cleviprex added for blood pressure control.  He remained on Vimpat/Keppra as well as Depakote prior to admission.  Dysphagia #3 nectar thick liquid diet.  Patient was admitted for a comprehensive rehab program.  Review of Systems  Constitutional: Negative for chills and fever.  HENT: Negative for hearing loss.   Eyes: Negative for blurred vision and double vision.  Respiratory: Negative for shortness of breath.   Cardiovascular: Positive for palpitations. Negative for chest pain and leg swelling.  Gastrointestinal: Positive for constipation. Negative for heartburn, nausea and vomiting.  Genitourinary: Negative for dysuria, flank pain and hematuria.  Musculoskeletal: Positive for myalgias.  Skin: Negative for rash.  Neurological: Positive for speech  change, seizures and weakness.  All other systems reviewed and are negative.  Past Medical History:  Diagnosis Date  . Acute deep vein thrombosis (DVT) of left upper extremity (HCC)    L brachial and basilic veins, eliquis started 08/22/2017 to continue for 3 months total   . Cognitive impairment 2007   after stroke, saw rehab but told to stop because was too upsetting to him  . History of chicken pox   . HLD (hyperlipidemia)   . HTN (hypertension)   . NSTEMI (non-ST elevated myocardial infarction) (HCC) 02/21/2017  . Obesity   . Stroke, hemorrhagic (HCC) 2007   thought 2/2 HTN (240sbp); residual cognitive impairment, loss of R peripheral field, no driving   Past Surgical History:  Procedure Laterality Date  . ANKLE SURGERY  1990s   right foot with plate and screws  . CORONARY ARTERY BYPASS GRAFT N/A 02/24/2017   3v Procedure: CORONARY ARTERY BYPASS GRAFTING (CABG) x 3 ON PUMP USING LEFT INTERNAL MAMMARY ARTERY TO LEFT ANTERIOR DESENDING CORNARY ARTERY, RIGHT GREATER SAPHENOUS VEIN TO LEFT CIRCUMFLEX ARTERY AND POSTERIOR DESENDING ARTERY. RIGHT GREATER SAPHENOUS VEIN OBTAINED VIA ENDOVEIN HARVEST.;  Surgeon: Delight Ovens, MD  . IABP INSERTION N/A 02/21/2017   Procedure: IABP Insertion;  Surgeon: Yvonne Kendall, MD;  Location: [facility];  Service: Cardiovascular;  Laterality: N/A;  . LEFT HEART CATH AND CORONARY ANGIOGRAPHY N/A 02/21/2017   Procedure: LEFT HEART CATH AND CORONARY ANGIOGRAPHY;  Surgeon: Yvonne Kendall, MD;  Location: [facility];  Service: Cardiovascular;  Laterality: N/A;  . TEE WITHOUT CARDIOVERSION N/A 02/24/2017   Procedure: TRANSESOPHAGEAL ECHOCARDIOGRAM (TEE);  Surgeon: Delight Ovens, MD;  Location: [facility] OR;  Service: Open Heart Surgery;  Laterality:  N/A;   Family History  Problem Relation Age of Onset  . Alzheimer's disease Maternal Grandfather   . Cancer Mother        lymphoma  . Alcohol abuse Father        smoker  . Coronary artery  disease Neg Hx   . Stroke Neg Hx   . Diabetes Neg Hx    Social History:  reports that he has never smoked. He has quit using smokeless tobacco. He reports previous alcohol use. He reports that he does not use drugs. Allergies:  Allergies  Allergen Reactions  . Losartan Other (See Comments)    hyperkalemia   Medications Prior to Admission  Medication Sig Dispense Refill  . aspirin EC 81 MG tablet Take 1 tablet (81 mg total) by mouth daily.    Marland Kitchen atorvastatin (LIPITOR) 80 MG tablet TAKE 1 TABLET BY MOUTH EVERY DAY (Patient taking differently: Take 80 mg by mouth daily. ) 90 tablet 0  . carvedilol (COREG) 3.125 MG tablet TAKE 1 TABLET (3.125 MG TOTAL) BY MOUTH 2 (TWO) TIMES DAILY. 180 tablet 0  . Cyanocobalamin (B-12) 1000 MCG SUBL Place 1 tablet under the tongue daily. (Patient taking differently: Place 1,000 mcg under the tongue daily. ) 90 each 0  . divalproex (DEPAKOTE) 500 MG DR tablet Take 1 tablet (500 mg total) by mouth 2 (two) times daily. 60 tablet 2  . Lacosamide (VIMPAT) 100 MG TABS Take 2 tablets (200 mg total) by mouth in the morning and at bedtime. 200 mg 2 times a day.  Total 400 mg. 120 tablet 0  . levETIRAcetam (KEPPRA) 750 MG tablet Take 2 tablets (1,500 mg total) by mouth 2 (two) times daily. 120 tablet 6  . Multiple Vitamin (MULTIVITAMIN WITH MINERALS) TABS tablet Take 1 tablet by mouth daily. 90 tablet 0  . sertraline (ZOLOFT) 25 MG tablet TAKE 1 TABLET BY MOUTH EVERY DAY (Patient taking differently: Take 25 mg by mouth daily. ) 90 tablet 1  . ziprasidone (GEODON) 40 MG capsule TAKE 1 CAPSULE (40 MG TOTAL) BY MOUTH AT BEDTIME. 90 capsule 1    Drug Regimen Review Drug regimen was reviewed and remains appropriate with no significant issues identified  Home: Home Living Family/patient expects to be discharged to:: Private residence Living Arrangements: Other relatives (sister) Available Help at Discharge: Family, Available 24 hours/day Type of Home: Mobile home Home  Access: Stairs to enter [employer] of Steps: 3 Entrance Stairs-Rails: Can reach both Home Layout: One level Bathroom Shower/Tub: Engineer, manufacturing systems: Standard Bathroom Accessibility: Yes Home Equipment: Environmental consultant - 2 wheels, Cane - single point, Bedside commode, Shower seat, Grab bars - tub/shower  Lives With: Family   Functional History: Prior Function Level of Independence: Independent Comments: enjoys mowing grass and doing yardwork; sister helps wiht Tourist information centre manager; sister pays bills; does not dirve  Functional Status:  Mobility: Bed Mobility Overal bed mobility: Needs Assistance Bed Mobility: Supine to Sit Supine to sit: Max assist, HOB elevated Sit to supine: Max assist General bed mobility comments: Max A to come to EOB, able to complete trunk engagement and neuromuscular re-education at EOB with with mod A, attemtped to clear hips to stand, pt unable Transfers Overall transfer level: Needs assistance Equipment used: 1 person hand held assist, 2 person hand held assist Transfers: Sit to/from Stand Sit to Stand: +2 physical assistance, Mod assist Stand pivot transfers: Mod assist, +2 physical assistance General transfer comment: Attempted to clear bottom x2 sitting EOB, unable to clear  even with knee blocked      ADL: ADL Overall ADL's : Needs assistance/impaired Eating/Feeding: Supervision/ safety, Set up Eating/Feeding Details (indicate cue type and reason): asked tnsg to check for pocketing Grooming: Moderate assistance, Sitting, Wash/dry hands, Wash/dry face Grooming Details (indicate cue type and reason): Noted discharge from L eye Upper Body Bathing: Moderate assistance, Sitting Lower Body Bathing: Maximal assistance, Sit to/from stand Upper Body Dressing : Moderate assistance, Sitting Lower Body Dressing: Maximal assistance, Sit to/from stand Toilet Transfer: +2 for physical assistance, Moderate assistance Toilet Transfer Details  (indicate cue type and reason): simulated; poor awareness Toileting- Clothing Manipulation and Hygiene: Maximal assistance Toileting - Clothing Manipulation Details (indicate cue type and reason): foley Functional mobility during ADLs: Moderate assistance, +2 for physical assistance, Cueing for safety, Cueing for sequencing General ADL Comments: Session focus on neuromuscular re-education and bed level exercises to aid in mobility and strength  Cognition: Cognition Overall Cognitive Status: Impaired/Different from baseline Arousal/Alertness: Awake/alert Orientation Level: Oriented to person, Oriented to place, Oriented to situation, Disoriented to time Awareness: Impaired Awareness Impairment: Intellectual impairment Problem Solving: Impaired Problem Solving Impairment: Functional basic, Verbal basic Cognition Arousal/Alertness: Lethargic Behavior During Therapy: WFL for tasks assessed/performed Overall Cognitive Status: Impaired/Different from baseline Area of Impairment: Attention, Memory, Following commands, Safety/judgement, Awareness, Problem solving, Orientation Orientation Level: Disoriented to, Time Current Attention Level: Sustained Memory: Decreased short-term memory Following Commands: Follows one step commands inconsistently, Follows one step commands with increased time Safety/Judgement: Decreased awareness of safety, Decreased awareness of deficits Awareness: Intellectual Problem Solving: Slow processing, Difficulty sequencing, Requires verbal cues, Requires tactile cues, Decreased initiation General Comments: Was able to talk about his tomatoes, however requires increased time to answer further questions however often states "I understand" Pt with difficulty motor planning bed level exercises, even on unaffected side  Physical Exam: Blood pressure 95/64, pulse 61, temperature 98.2 F (36.8 C), resp. rate 17, weight 84.5 kg, SpO2 98 %. Physical Exam Constitutional:       Appearance: Normal appearance.  HENT:     Head: Normocephalic and atraumatic.     Nose: Nose normal. No congestion.     Mouth/Throat:     Comments: Thrush on tongue Eyes:     Extraocular Movements: Extraocular movements intact.     Pupils: Pupils are equal, round, and reactive to light.  Cardiovascular:     Rate and Rhythm: Normal rate and regular rhythm.     Heart sounds: No murmur heard.  No friction rub.  Pulmonary:     Effort: Pulmonary effort is normal. No respiratory distress.     Breath sounds: No stridor. No wheezing or rhonchi.  Abdominal:     General: Abdomen is flat. There is no distension.     Palpations: There is no mass.  Musculoskeletal:     Cervical back: Normal range of motion.  Skin:    Comments: Old sternal incision  Neurological:     Mental Status: He is alert.     Comments: A&O x 3. Normal language. Provides biographical information. Follows all commands.  Reasonable insight and awareness. speech slightly dysarthric. Left central 7. LUE 0/5. LLE 1 to 1+/5. RUE and RLE 4+ to 5/5. Sensation tr/2 LUE and 1/2 LLE. Normal sensation on right. No resting tone.      Psychiatric:        Mood and Affect: Mood normal.        Behavior: Behavior normal.     Results for orders placed or performed during the hospital  encounter of 07/19/19 (from the past 48 hour(s))  CBC     Status: Abnormal   Collection Time: 07/22/19  4:19 AM  Result Value Ref Range   WBC 8.5 4.0 - 10.5 K/uL   RBC 3.78 (L) 4.22 - 5.81 MIL/uL   Hemoglobin 11.7 (L) 13.0 - 17.0 g/dL   HCT 70.3 (L) 39 - 52 %   MCV 91.8 80.0 - 100.0 fL   MCH 31.0 26.0 - 34.0 pg   MCHC 33.7 30.0 - 36.0 g/dL   RDW 50.0 93.8 - 18.2 %   Platelets 202 150 - 400 K/uL   nRBC 0.0 0.0 - 0.2 %    Comment: Performed at [facility], [address], [place], [place] [address]  Basic metabolic panel     Status: Abnormal   Collection Time: 07/22/19  4:19 AM  Result Value Ref Range   Sodium 134 (L) 135 - 145 mmol/L    Potassium 3.6 3.5 - 5.1 mmol/L   Chloride 99 98 - 111 mmol/L   CO2 25 22 - 32 mmol/L   Glucose, Bld 97 70 - 99 mg/dL    Comment: Glucose reference range applies only to samples taken after fasting for at least 8 hours.   BUN 9 8 - 23 mg/dL   Creatinine, Ser 6.96 0.61 - 1.24 mg/dL   Calcium 8.5 (L) 8.9 - 10.3 mg/dL   GFR calc non Af Amer >60 >60 mL/min   GFR calc Af Amer >60 >60 mL/min   Anion gap 10 5 - 15    Comment: Performed at [facility], [address] [place], [place] [address]   DG Swallowing Func-Speech Pathology  Result Date: 07/21/2019 Objective Swallowing Evaluation: Type of Study: MBS-Modified Barium Swallow Study  Patient Details Name: [name] Cruz MRN: [id number] Date of Birth: 1955-03-22 Today's Date: 07/21/2019 Time: SLP Start Time (ACUTE ONLY): 1310 -SLP Stop Time (ACUTE ONLY): 1334 SLP Time Calculation (min) (ACUTE ONLY): 24 min Past Medical History: Past Medical History: Diagnosis Date . Acute deep vein thrombosis (DVT) of left upper extremity (HCC)   L brachial and basilic veins, eliquis started 08/22/2017 to continue for 3 months total  . Cognitive impairment 2007  after stroke, saw rehab but told to stop because was too upsetting to him . History of chicken pox  . HLD (hyperlipidemia)  . HTN (hypertension)  . NSTEMI (non-ST elevated myocardial infarction) (HCC) 02/21/2017 . Obesity  . Stroke, hemorrhagic (HCC) 2007  thought 2/2 HTN (240sbp); residual cognitive impairment, loss of R peripheral field, no driving Past Surgical History: Past Surgical History: Procedure Laterality Date . ANKLE SURGERY  1990s  right foot with plate and screws . CORONARY ARTERY BYPASS GRAFT N/A 02/24/2017  3v Procedure: CORONARY ARTERY BYPASS GRAFTING (CABG) x 3 ON PUMP USING LEFT INTERNAL MAMMARY ARTERY TO LEFT ANTERIOR DESENDING CORNARY ARTERY, RIGHT GREATER SAPHENOUS VEIN TO LEFT CIRCUMFLEX ARTERY AND POSTERIOR DESENDING ARTERY. RIGHT GREATER SAPHENOUS VEIN OBTAINED VIA ENDOVEIN HARVEST.;  Surgeon:  Delight Ovens, MD . IABP INSERTION N/A 02/21/2017  Procedure: IABP Insertion;  Surgeon: Yvonne Kendall, MD;  Location: [facility];  Service: Cardiovascular;  Laterality: N/A; . LEFT HEART CATH AND CORONARY ANGIOGRAPHY N/A 02/21/2017  Procedure: LEFT HEART CATH AND CORONARY ANGIOGRAPHY;  Surgeon: Yvonne Kendall, MD;  Location: [facility];  Service: Cardiovascular;  Laterality: N/A; . TEE WITHOUT CARDIOVERSION N/A 02/24/2017  Procedure: TRANSESOPHAGEAL ECHOCARDIOGRAM (TEE);  Surgeon: Delight Ovens, MD;  Location: [facility] OR;  Service: Open Heart Surgery;  Laterality: N/A; HPI: Pt is a 64 yo male presenting with sudden onset L sided weakness. CT showed R thalamic hemorrhage. PMH: DVT, hemorrhagic stroke with residual cognitive-linguistic impairment, HTN, HLD, seizures, NSTEMI  Subjective: alert, cooperative, limited historian Assessment / Plan / Recommendation CHL IP CLINICAL IMPRESSIONS 07/21/2019 Clinical Impression Pt presents with a moderate oropharyngeal dysphagia. He has limited and slow lingual movement, resulting in reduced lingual control for containment and posterior propulsion. Mastication is disorganized, but overall there is mild oral residue and minimal anterior spillage. Swallow triggers primarily at the pyriform sinuses with thin liquids spilling over into the airway silently and in small amounts before the swallow is initiated. He also has reduced pharyngeal squeeze, base of tongue retraction, and anterior hyolaryngeal movement that leaves mild residue in the pyriform sinuses. With residue already present int he pharynx, straw sips of nectar thick liquids are also silent aspirated before the swallow. A chin tuck does not improve his airway protection and he needs moderate amounts of cueing to implement it, making it an ineffective strategy. He had improved ariway protection with thin liquids when cued to take a small sip, hold it anteriorly in his mouth, and then swallow. Although it  was still triggered at the pyriform sinuses, he had improving coordination and smaller boluses sizes that could be better contained. Recommend keeping him on Dys 3 diet and nectar thick liquids with SLP f/u to facilitate strategy use with therapeutic trials of thin liquids and potential exercises.  SLP Visit Diagnosis Dysphagia, oropharyngeal phase (R13.12) Attention and concentration deficit following -- Frontal lobe and executive function deficit following -- Impact on safety and function Mild aspiration risk;Moderate aspiration risk   CHL IP TREATMENT RECOMMENDATION 07/21/2019 Treatment Recommendations Therapy as outlined in treatment plan below   Prognosis 07/21/2019 Prognosis for Safe Diet Advancement Good Barriers to Reach Goals Cognitive deficits;Language deficits Barriers/Prognosis Comment -- CHL IP DIET RECOMMENDATION 07/21/2019 SLP Diet Recommendations Dysphagia 3 (Mech soft) solids;Nectar thick liquid Liquid Administration via Cup;No straw Medication Administration Whole meds with puree Compensations Slow rate;Small sips/bites;Monitor for anterior loss Postural Changes Seated upright at 90 degrees   CHL IP OTHER RECOMMENDATIONS 07/21/2019 Recommended Consults -- Oral Care Recommendations Oral care BID Other Recommendations Order thickener from pharmacy;Prohibited food (jello, ice cream, thin soups);Remove water pitcher   CHL IP FOLLOW UP RECOMMENDATIONS 07/21/2019 Follow up Recommendations Inpatient Rehab   CHL IP FREQUENCY AND DURATION 07/21/2019 Speech Therapy Frequency (ACUTE ONLY) min 2x/week Treatment Duration 2 weeks      CHL IP ORAL PHASE 07/21/2019 Oral Phase Impaired Oral - Pudding Teaspoon -- Oral - Pudding Cup -- Oral - Honey Teaspoon -- Oral - Honey Cup -- Oral - Nectar Teaspoon -- Oral - Nectar Cup Weak lingual manipulation;Reduced posterior propulsion;Lingual/palatal residue;Decreased bolus cohesion;Delayed oral transit Oral - Nectar Straw Weak lingual manipulation;Reduced posterior  propulsion;Lingual/palatal residue;Decreased bolus cohesion;Delayed oral transit Oral - Thin Teaspoon -- Oral - Thin Cup Weak lingual manipulation;Reduced posterior propulsion;Lingual/palatal residue;Decreased bolus cohesion;Delayed oral transit Oral - Thin Straw Weak lingual manipulation;Reduced posterior propulsion;Lingual/palatal residue;Decreased bolus cohesion;Delayed oral transit Oral - Puree Weak lingual manipulation;Reduced posterior propulsion;Lingual/palatal residue;Decreased bolus cohesion;Delayed oral transit Oral - Mech Soft Weak lingual manipulation;Reduced posterior propulsion;Lingual/palatal residue;Decreased bolus cohesion;Delayed oral transit;Impaired mastication Oral - Regular -- Oral - Multi-Consistency -- Oral - Pill -- Oral Phase - Comment --  CHL IP PHARYNGEAL PHASE 07/21/2019 Pharyngeal Phase Impaired Pharyngeal- Pudding Teaspoon -- Pharyngeal -- Pharyngeal- Pudding Cup -- Pharyngeal -- Pharyngeal- Honey Teaspoon -- Pharyngeal --  Pharyngeal- Honey Cup -- Pharyngeal -- Pharyngeal- Nectar Teaspoon -- Pharyngeal -- Pharyngeal- Nectar Cup Delayed swallow initiation-pyriform sinuses;Reduced pharyngeal peristalsis;Reduced anterior laryngeal mobility;Reduced airway/laryngeal closure;Reduced tongue base retraction;Pharyngeal residue - pyriform Pharyngeal -- Pharyngeal- Nectar Straw Delayed swallow initiation-pyriform sinuses;Reduced pharyngeal peristalsis;Reduced anterior laryngeal mobility;Reduced airway/laryngeal closure;Reduced tongue base retraction;Pharyngeal residue - pyriform;Penetration/Aspiration before swallow Pharyngeal Material enters airway, passes BELOW cords without attempt by patient to eject out (silent aspiration) Pharyngeal- Thin Teaspoon -- Pharyngeal -- Pharyngeal- Thin Cup Delayed swallow initiation-pyriform sinuses;Reduced pharyngeal peristalsis;Reduced anterior laryngeal mobility;Reduced airway/laryngeal closure;Reduced tongue base retraction;Pharyngeal residue -  pyriform;Penetration/Aspiration before swallow Pharyngeal Material enters airway, passes BELOW cords without attempt by patient to eject out (silent aspiration) Pharyngeal- Thin Straw Delayed swallow initiation-pyriform sinuses;Reduced pharyngeal peristalsis;Reduced anterior laryngeal mobility;Reduced airway/laryngeal closure;Reduced tongue base retraction;Pharyngeal residue - pyriform;Penetration/Aspiration before swallow Pharyngeal Material enters airway, passes BELOW cords without attempt by patient to eject out (silent aspiration) Pharyngeal- Puree Delayed swallow initiation-pyriform sinuses;Reduced pharyngeal peristalsis;Reduced anterior laryngeal mobility;Reduced airway/laryngeal closure;Reduced tongue base retraction;Pharyngeal residue - pyriform Pharyngeal -- Pharyngeal- Mechanical Soft Delayed swallow initiation-pyriform sinuses;Reduced pharyngeal peristalsis;Reduced anterior laryngeal mobility;Reduced airway/laryngeal closure;Reduced tongue base retraction;Pharyngeal residue - pyriform Pharyngeal -- Pharyngeal- Regular -- Pharyngeal -- Pharyngeal- Multi-consistency -- Pharyngeal -- Pharyngeal- Pill -- Pharyngeal -- Pharyngeal Comment --  CHL IP CERVICAL ESOPHAGEAL PHASE 07/21/2019 Cervical Esophageal Phase WFL Pudding Teaspoon -- Pudding Cup -- Honey Teaspoon -- Honey Cup -- Nectar Teaspoon -- Nectar Cup -- Nectar Straw -- Thin Teaspoon -- Thin Cup -- Thin Straw -- Puree -- Mechanical Soft -- Regular -- Multi-consistency -- Pill -- Cervical Esophageal Comment -- Mahala Menghini., M.A. CCC-SLP Acute Rehabilitation Services Pager [phone] Office [phone] 07/21/2019, 2:17 PM              ECHOCARDIOGRAM COMPLETE  Result Date: 07/21/2019    ECHOCARDIOGRAM REPORT   Patient Name:   IZAIA SAY Date of Exam: 07/21/2019 Medical Rec #:  [id number]      Height:       63.0 in Accession #:    [id number]     Weight:       186.3 lb Date of Birth:  02-Oct-1955      BSA:          1.876 m Patient Age:    64 years       BP:            80/62 mmHg Patient Gender: M              HR:           62 bpm. Exam Location:  Inpatient Procedure: 2D Echo, Cardiac Doppler and Color Doppler Indications:    Stroke 434.91 / I163.9  History:        Patient has prior history of Echocardiogram examinations, most                 recent 07/09/2018. CAD, Stroke, Arrythmias:Cardiac Arrest, Atrial                 Fibrillation and non-specific ST changes; Risk                 Factors:Hypertension, Dyslipidemia and Non-Smoker. PAD.  Sonographer:    Renella Cunas RDCS Referring Phys: [id number] SHARON L BIBY  Sonographer Comments: Image acquisition challenging due to respiratory motion. IMPRESSIONS  1. Left ventricular ejection fraction, by estimation, is 55 to 60%. The left ventricle has normal function. The left ventricle has no regional wall motion abnormalities. Left ventricular diastolic parameters were normal.  2. Right ventricular systolic function is normal. The right ventricular size is normal. There is normal pulmonary artery systolic pressure.  3. The mitral valve is normal in structure. Trivial mitral valve regurgitation. No evidence of mitral stenosis.  4. The aortic valve is normal in structure. Aortic valve regurgitation is not visualized. No aortic stenosis is present.  5. The inferior vena cava is normal in size with greater than 50% respiratory variability, suggesting right atrial pressure of 3 mmHg. FINDINGS  Left Ventricle: Left ventricular ejection fraction, by estimation, is 55 to 60%. The left ventricle has normal function. The left ventricle has no regional wall motion abnormalities. The left ventricular internal cavity size was normal in size. There is  no left ventricular hypertrophy. Left ventricular diastolic parameters were normal. Right Ventricle: The right ventricular size is normal. No increase in right ventricular wall thickness. Right ventricular systolic function is normal. There is normal pulmonary artery systolic pressure. The tricuspid  regurgitant velocity is 1.73 m/s, and  with an assumed right atrial pressure of 3 mmHg, the estimated right ventricular systolic pressure is 15.0 mmHg. Left Atrium: Left atrial size was normal in size. Right Atrium: Right atrial size was normal in size. Pericardium: There is no evidence of pericardial effusion. Mitral Valve: The mitral valve is normal in structure. There is mild thickening of the mitral valve leaflet(s). There is mild calcification of the mitral valve leaflet(s). Normal mobility of the mitral valve leaflets. Trivial mitral valve regurgitation. No evidence of mitral valve stenosis. Tricuspid Valve: The tricuspid valve is normal in structure. Tricuspid valve regurgitation is trivial. No evidence of tricuspid stenosis. Aortic Valve: The aortic valve is normal in structure. Aortic valve regurgitation is not visualized. No aortic stenosis is present. Pulmonic Valve: The pulmonic valve was normal in structure. Pulmonic valve regurgitation is not visualized. No evidence of pulmonic stenosis. Aorta: The aortic root is normal in size and structure. Venous: The inferior vena cava is normal in size with greater than 50% respiratory variability, suggesting right atrial pressure of 3 mmHg. IAS/Shunts: No atrial level shunt detected by color flow Doppler.  LEFT VENTRICLE PLAX 2D LVIDd:         4.80 cm     Diastology LVIDs:         3.40 cm     LV e' lateral:   11.50 cm/s LV PW:         0.90 cm     LV E/e' lateral: 4.7 LV IVS:        0.90 cm     LV e' medial:    8.27 cm/s LVOT diam:     2.20 cm     LV E/e' medial:  6.5 LV SV:         62 LV SV Index:   33 LVOT Area:     3.80 cm  LV Volumes (MOD) LV vol d, MOD A2C: 96.0 ml LV vol d, MOD A4C: 90.7 ml LV vol s, MOD A2C: 48.1 ml LV vol s, MOD A4C: 38.3 ml LV SV MOD A2C:     47.9 ml LV SV MOD A4C:     90.7 ml LV SV MOD BP:      50.6 ml RIGHT VENTRICLE RV S prime:     10.10 cm/s TAPSE (M-mode): 1.2 cm LEFT ATRIUM             Index       RIGHT ATRIUM           Index  LA  diam:        3.60 cm 1.92 cm/m  RA Area:     11.00 cm LA Vol (A2C):   34.2 ml 18.23 ml/m RA Volume:   22.40 ml  11.94 ml/m LA Vol (A4C):   27.0 ml 14.39 ml/m LA Biplane Vol: 31.4 ml 16.74 ml/m  AORTIC VALVE LVOT Vmax:   79.70 cm/s LVOT Vmean:  55.600 cm/s LVOT VTI:    0.162 m  AORTA Ao Root diam: 3.50 cm MITRAL VALVE               TRICUSPID VALVE MV Area (PHT): 4.49 cm    TR Peak grad:   12.0 mmHg MV Decel Time: 169 msec    TR Vmax:        173.00 cm/s MV E velocity: 53.60 cm/s MV A velocity: 31.20 cm/s  SHUNTS MV E/A ratio:  1.72        Systemic VTI:  0.16 m                            Systemic Diam: 2.20 cm Charlton Haws MD Electronically signed by Charlton Haws MD Signature Date/Time: 07/21/2019/11:23:56 AM    Final    VAS US CAROTID  Result Date: 07/22/2019 Carotid Arterial Duplex Study Indications:       CVA. Risk Factors:      Hypertension, hyperlipidemia. Comparison Study:  No prior studies. Performing Technologist: Chanda Busing RVT  Examination Guidelines: A complete evaluation includes B-mode imaging, spectral Doppler, color Doppler, and power Doppler as needed of all accessible portions of each vessel. Bilateral testing is considered an integral part of a complete examination. Limited examinations for reoccurring indications may be performed as noted.  Right Carotid Findings: +----------+--------+--------+--------+-----------------------+--------+           PSV cm/sEDV cm/sStenosisPlaque Description     Comments +----------+--------+--------+--------+-----------------------+--------+ CCA Prox  72      21              smooth and heterogenoustortuous +----------+--------+--------+--------+-----------------------+--------+ CCA Distal66      18                                              +----------+--------+--------+--------+-----------------------+--------+ ICA Prox  110     34              calcific                         +----------+--------+--------+--------+-----------------------+--------+ ICA Distal83      33                                     tortuous +----------+--------+--------+--------+-----------------------+--------+ ECA       50      8                                               +----------+--------+--------+--------+-----------------------+--------+ +----------+--------+-------+--------+-------------------+           PSV cm/sEDV cmsDescribeArm Pressure (mmHG) +----------+--------+-------+--------+-------------------+ Subclavian119                                        +----------+--------+-------+--------+-------------------+ +---------+--------+--+--------+--+---------+  VertebralPSV cm/s39EDV cm/s11Antegrade +---------+--------+--+--------+--+---------+  Left Carotid Findings: +----------+--------+--------+--------+-----------------------+--------+           PSV cm/sEDV cm/sStenosisPlaque Description     Comments +----------+--------+--------+--------+-----------------------+--------+ CCA Prox  86      21              smooth and heterogenous         +----------+--------+--------+--------+-----------------------+--------+ CCA Distal67      18              calcific                        +----------+--------+--------+--------+-----------------------+--------+ ICA Prox  92      27              calcific               tortuous +----------+--------+--------+--------+-----------------------+--------+ ICA Distal97      31                                     tortuous +----------+--------+--------+--------+-----------------------+--------+ ECA       65      9                                               +----------+--------+--------+--------+-----------------------+--------+ +----------+--------+--------+--------+-------------------+           PSV cm/sEDV cm/sDescribeArm Pressure (mmHG)  +----------+--------+--------+--------+-------------------+ [id number]                                          +----------+--------+--------+--------+-------------------+ +---------+--------+--+--------+--+---------+ VertebralPSV cm/s72EDV cm/s20Antegrade +---------+--------+--+--------+--+---------+   Summary: Right Carotid: Velocities in the right ICA are consistent with a 1-39% stenosis. Left Carotid: Velocities in the left ICA are consistent with a 1-39% stenosis. Vertebrals: Bilateral vertebral arteries demonstrate antegrade flow. *See table(s) above for measurements and observations.  Electronically signed by Delia Heady MD on 07/22/2019 at 1:55:38 PM.    Final        Medical Problem List and Plan: 1.  Left side weakness/dysarthria secondary to right thalamocapsular hemorrhage most likely related to hypertensive crisis as well as old left temporal parietal infarction with small vessel disease  -patient may shower  -ELOS/Goals: 12-18 days, supervision goals 2.  Antithrombotics: -DVT/anticoagulation:  SCD  -antiplatelet therapy: N/A 3. Pain Management: Tylenol as needed 4. Mood: Zoloft 25 mg daily  -antipsychotic agents: Geodon 40 mg nightly  -seems to be in good spirits, sleeping well. Continue to monitor 5. Neuropsych: This patient is not capable of making decisions on his own behalf. 6. Skin/Wound Care: Routine skin checks 7. Fluids/Electrolytes/Nutrition: has reasonable appetite.   I personally reviewed the patient's labs today.     -slight hyponatremia --observe   -low albumin: protein supp   -push fluids 8.  Dysphagia.  Dysphagia #3 nectar thick liquids.  Advance per speech therapy 9.  History of seizure disorder.  Vimpat 200 mg twice daily, Keppra 1500 mg twice daily, Depakote 500 mg twice daily.  10.  Hypertension.  Coreg 3.125 mg twice daily.  Monitor with increased mobility 11.  PAF.  Cardiac rate controlled, regular currently  -No anticoagulation due to  history of left MCA infarction with hemorrhagic conversion 12.  Hyperlipidemia.  Resume Lipitor at discharge 13.  Obesity.  BMI 33.  Dietary follow-up 14.  CAD with CABG 2019.  No chest pain or shortness of breath. 15.  Mild thrombocytopenia: 161, follow up cbc while here    Charlton Amor, PA-C 07/23/2019

## 2019-07-22 NOTE — Progress Notes (Signed)
Inpatient Rehab Admissions:  Inpatient Rehab Consult received.  I met with patient at the bedside for rehabilitation assessment and to discuss goals and expectations of an inpatient rehab admission.  Insight into deficits was poor (pt saying that he could go home today and his sister Shauna Hugh) could provide the level of care needed).  I was able to speak to his sister [name]) on the phone and the family is in agreement to pursue CIR.  They can provide 24/7 assist at discharge if needed.  I will open insurance for prior authorization and continue to follow.    Signed: Shann Medal, PT, DPT Admissions Coordinator [phone] 07/22/19  11:32 AM

## 2019-07-23 ENCOUNTER — Inpatient Hospital Stay (HOSPITAL_COMMUNITY): Payer: Medicare Other

## 2019-07-23 NOTE — PMR Pre-admission (Signed)
PMR Admission Coordinator Pre-Admission Assessment  Patient: [name] Cruz is an 64 y.o., male MRN: [id number] DOB: 15-Aug-1955 Height:   Weight: 84.5 kg              Insurance Information HMO: yes    PPO:      PCP:      IPA:      80/20:      OTHER:  PRIMARY: [employer] Medicare      Policy#: [id number]      Subscriber: pt CM Name: Wilburn Cornelia      Phone#: [phone]     Fax#: [phone] Pre-Cert#: [id number]      Employer:  Benefits:  Phone #: [phone]     Name:  Eff. Date: 01/15/19     Deduct: $0      Out of Pocket Max: $4500 (met $590)      Life Max: n/a  CIR: $325/day for days 1-5      SNF: 20 full days Outpatient:      Co-Pay: $35/visit Home Health: 100%      Co-Pay:  DME: 80%     12 Co-Pay: 20% Providers:  SECONDARY:       Policy#:       Phone#:   Development worker, community:       Phone#:   The "Data Collection Information Summary" for patients in Inpatient Rehabilitation Facilities with attached "Privacy Act [place] Records" was provided and verbally reviewed with: Patient  Emergency Contact Information Contact Information    Name Relation Home Work Mobile   [place] Sister [phone]   Letta Moynahan [phone]  [phone]     Current Medical History  Patient Admitting Diagnosis: [place]  History of Present Illness: [name] Cruz is a 64 y.o.right handed male hyperlipidemia, seizure disorder, bipolar disorder maintained on Geodon, CAD with CABG 2019, hypertension with history of deep vein thrombosis previously on Eliquis discontinued due to hemorrhagic CVA 2007.  Presented 07/19/2019 with left-sided weakness and slurred speech.  Noted systolic blood pressure 638L.  Cranial CT scan showed a 1.4 x 2.0 x 2.6 cm acute parenchymal hemorrhage centered within the right thalamocapsular junction.  Redemonstrated a chronic left temporoparietal infarction.  MRI pending.  Alcohol level negative, Sodium 132, urine drug screen negative.  Cleviprex  added for blood pressure control.  Patient remains on Vimpat as well as Depakote as prior to admission.  Dysphagia #3 nectar thick liquid diet.  Therapy evaluation completed with recommendations of physical medicine rehab consult.   Complete NIHSS TOTAL: 17  Past Medical History  Past Medical History:  Diagnosis Date  . Acute deep vein thrombosis (DVT) of left upper extremity (HCC)    L brachial and basilic veins, eliquis started 08/22/2017 to continue for 3 months total   . Cognitive impairment 2007   after stroke, saw rehab but told to stop because was too upsetting to him  . History of chicken pox   . HLD (hyperlipidemia)   . HTN (hypertension)   . NSTEMI (non-ST elevated myocardial infarction) ([place]) 02/21/2017  . Obesity   . Stroke, hemorrhagic ([place]) 2007   thought 2/2 HTN (240sbp); residual cognitive impairment, loss of R peripheral field, no driving    Family History  family history includes Alcohol abuse in his father; Alzheimer's disease in his maternal grandfather; Cancer in his mother.  Prior Rehab/Hospitalizations:  Has the patient had prior rehab or hospitalizations prior to admission? Yes  Has the patient had major surgery during 100 days prior to admission?  No  Current Medications   Current Facility-Administered Medications:  .   stroke: mapping our early stages of recovery book, , Does not apply, Once, Aroor, Karena Addison R, MD .  acetaminophen (TYLENOL) tablet 650 mg, 650 mg, Oral, Q4H PRN, 650 mg at 07/24/19 1836 **OR** acetaminophen (TYLENOL) 160 MG/5ML solution 650 mg, 650 mg, Per Tube, Q4H PRN **OR** acetaminophen (TYLENOL) suppository 650 mg, 650 mg, Rectal, Q4H PRN, Aroor, Lanice Schwab, MD .  Chlorhexidine Gluconate Cloth 2 % PADS 6 each, 6 each, Topical, Daily, Aroor, Lanice Schwab, MD, 6 each at 07/26/19 1056 .  divalproex (DEPAKOTE) DR tablet 500 mg, 500 mg, Oral, BID, Biby, Sharon L, NP, 500 mg at 07/26/19 1055 .  lacosamide (VIMPAT) tablet 200 mg, 200 mg, Oral, 2  times per day, Donzetta Starch, NP, 200 mg at 07/26/19 0852 .  levETIRAcetam (KEPPRA) tablet 1,500 mg, 1,500 mg, Oral, BID, Biby, Sharon L, NP, 1,500 mg at 07/26/19 1055 .  MEDLINE mouth rinse, 15 mL, Mouth Rinse, BID, Aroor, Karena Addison R, MD, 15 mL at 07/26/19 1056 .  multivitamin with minerals tablet 1 tablet, 1 tablet, Oral, Daily, Biby, Sharon L, NP, 1 tablet at 07/26/19 1055 .  pantoprazole (PROTONIX) EC tablet 40 mg, 40 mg, Oral, QHS, Garvin Fila, MD, 40 mg at 07/25/19 2236 .  Resource [employer], , Oral, PRN, Garvin Fila, MD .  senna-docusate (Senokot-S) tablet 1 tablet, 1 tablet, Oral, BID, Aroor, Lanice Schwab, MD, 1 tablet at 07/26/19 1055 .  sertraline (ZOLOFT) tablet 25 mg, 25 mg, Oral, Daily, Biby, Sharon L, NP, 25 mg at 07/26/19 1055 .  ziprasidone (GEODON) capsule 40 mg, 40 mg, Oral, QHS, Biby, Sharon L, NP, 40 mg at 07/25/19 2236  Patients Current Diet:  Diet Order            DIET DYS 3 Room service appropriate? Yes with Assist; Fluid consistency: Nectar Thick  Diet effective now                 Precautions / Restrictions Precautions Precautions: Fall Precaution Comments: left sided weakness and inattention Restrictions Weight Bearing Restrictions: No   Has the patient had 2 or more falls or a fall with injury in the past year?Yes  Prior Activity Level  Independent, able to mow the yard, not driving, no DME used  Prior Functional Level Prior Function Level of Independence: Independent Comments: enjoys mowing grass and doing yardwork; sister helps wiht Best boy; sister pays bills; does not dirve  Self Care: Did the patient need help bathing, dressing, using the toilet or eating?  Independent  Indoor Mobility: Did the patient need assistance with walking from room to room (with or without device)? Independent  Stairs: Did the patient need assistance with internal or external stairs (with or without device)? Independent  Functional  Cognition: Did the patient need help planning regular tasks such as shopping or remembering to take medications? Needed some help  Home Assistive Devices / Equipment Home Equipment: Walker - 2 wheels, Brutus - single point, Bedside commode, Shower seat, Grab bars - tub/shower  Prior Device Use: Indicate devices/aids used by the patient prior to current illness, exacerbation or injury? None of the above  Current Functional Level Cognition  Arousal/Alertness: Awake/alert Overall Cognitive Status: Impaired/Different from baseline Current Attention Level: Sustained Orientation Level: Oriented to person, Oriented to place, Oriented to situation Following Commands: Follows one step commands inconsistently, Follows one step commands with increased time Safety/Judgement: Decreased awareness of safety, Decreased awareness  of deficits General Comments: Was able to talk about his tomatoes, however requires increased time to answer further questions however often states "I understand" Pt with difficulty motor planning bed level exercises, even on unaffected side Awareness: Impaired Awareness Impairment: Intellectual impairment Problem Solving: Impaired Problem Solving Impairment: Functional basic, Verbal basic    Extremity Assessment (includes Sensation/Coordination)  Upper Extremity Assessment: Defer to OT evaluation LUE Deficits / Details: some attempts to use spontaneously; observed active shoulder elevation and elbow extension; no functional grasp LUE Sensation: decreased light touch, decreased proprioception LUE Coordination: decreased fine motor, decreased gross motor  Lower Extremity Assessment: LLE deficits/detail LLE Deficits / Details: increased tone with limited flexion, unable to isolate joint movement, tight heel cord with foot drop LLE Sensation: decreased proprioception LLE Coordination: decreased gross motor    ADLs  Overall ADL's : Needs assistance/impaired Eating/Feeding:  Supervision/ safety, Set up Eating/Feeding Details (indicate cue type and reason): asked tnsg to check for pocketing Grooming: Moderate assistance, Sitting, Wash/dry hands, Wash/dry face Grooming Details (indicate cue type and reason): Noted discharge from L eye Upper Body Bathing: Moderate assistance, Sitting Lower Body Bathing: Maximal assistance, Sit to/from stand Upper Body Dressing : Moderate assistance, Sitting Lower Body Dressing: Maximal assistance, Sit to/from stand Toilet Transfer: +2 for physical assistance, Moderate assistance Toilet Transfer Details (indicate cue type and reason): simulated; poor awareness Toileting- Clothing Manipulation and Hygiene: Maximal assistance Toileting - Clothing Manipulation Details (indicate cue type and reason): foley Functional mobility during ADLs: Moderate assistance, +2 for physical assistance, Cueing for safety, Cueing for sequencing General ADL Comments: Session focus on neuromuscular re-education and bed level exercises to aid in mobility and strength    Mobility  Overal bed mobility: Needs Assistance Bed Mobility: Supine to Sit Supine to sit: Max assist, HOB elevated Sit to supine: Max assist General bed mobility comments: Max A to come to EOB, able to complete trunk engagement and neuromuscular re-education at EOB with with mod A, attemtped to clear hips to stand, pt unable    Transfers  Overall transfer level: Needs assistance Equipment used: 1 person hand held assist, 2 person hand held assist Transfers: Sit to/from Stand Sit to Stand: +2 physical assistance, Mod assist Stand pivot transfers: Mod assist, +2 physical assistance General transfer comment: Attempted to clear bottom x2 sitting EOB, unable to clear even with knee blocked    Ambulation / Gait / Stairs / Wheelchair Mobility       Posture / Balance Dynamic Sitting Balance Sitting balance - Comments: Required multimodal cues in order to maintain balance EOB, however will  correct, pt noted with trace movement in LUE when completing various truncal engagement exercises Balance Overall balance assessment: Needs assistance Sitting-balance support: Feet supported, Bilateral upper extremity supported Sitting balance-Leahy Scale: Poor Sitting balance - Comments: Required multimodal cues in order to maintain balance EOB, however will correct, pt noted with trace movement in LUE when completing various truncal engagement exercises Postural control: Right lateral lean, Left lateral lean, Posterior lean Standing balance support: Single extremity supported Standing balance-Leahy Scale: Poor Standing balance comment: mod assist with R hand supported on bed rail to stand. Left leg blocked and both lower legs supported on the bed.     Special needs/care consideration Designated visitor [place] (from acute therapy documentation) Living Arrangements: Other relatives (sister)  Lives With: Family Available Help at Discharge: Family, Available 24 hours/day Type of Home: Mobile home Home Layout: One level Home Access: Stairs  to enter Entrance Stairs-Rails: Can reach both Entrance Stairs-Number of Steps: 3 Bathroom Shower/Tub: Chiropodist: Standard Bathroom Accessibility: Yes How Accessible: Accessible via walker  Discharge Living Setting Plans for Discharge Living Setting: Lives with (comment) (sister Butch Penny) Type of Home at Discharge: Mobile home Discharge Home Layout: One level Discharge Home Access: [place] entrance Discharge Bathroom Shower/Tub: [place] unit Discharge Bathroom Toilet: Standard Discharge Bathroom Accessibility: No  Social/Family/Support Systems Anticipated Caregiver: sisters [name], Butch Penny, and Diane) Anticipated Caregiver's Contact Information: [name] [phone] Ability/Limitations of Caregiver: supervision to min assist Caregiver Availability: 24/7 Discharge Plan Discussed with  Primary Caregiver: Yes Is Caregiver In Agreement with Plan?: Yes Does Caregiver/Family have Issues with Lodging/Transportation while Pt is in Rehab?: No   Goals Patient/Family Goal for Rehab: PT/OT/SLP supervision Expected length of stay: 12-18 days Pt/Family Agrees to Admission and willing to participate: Yes Program Orientation Provided & Reviewed with Pt/Caregiver Including Roles  & Responsibilities: Yes  Barriers to Discharge: Insurance for SNF coverage   Decrease burden of Care through IP rehab admission: n/a  Possible need for SNF placement upon discharge: Not anticipated   Patient Condition: This patient's medical and functional status has changed since the consult dated: 7/6 in which the Rehabilitation Physician determined and documented that the patient's condition is appropriate for intensive rehabilitative care in an inpatient rehabilitation facility. See "History of Present Illness" (above) for medical update. Functional changes are:no notable functional changes. Patient's medical and functional status update has been discussed with the Rehabilitation physician and patient remains appropriate for inpatient rehabilitation. Will admit to inpatient rehab today.  Preadmission Screen Completed By:  Genella Mech, CCC-SLP, DPT 07/26/2019 11:23 AM ______________________________________________________________________   Discussed status with Dr. Naaman Plummer on 07/26/2019 at 1000 and received approval for admission today.  Admission Coordinator:  Genella Mech, 11:37am on 07/26/19  Edits made by Clemens Catholic, [place], [place]

## 2019-07-23 NOTE — Progress Notes (Signed)
07/23/19 1717 07/23/19 1730 07/23/19 1815  Assess: MEWS Score  Temp (!) 102.1 F (38.9 C)  --   --   BP 106/83  --   --   Pulse Rate (!) 102  --   --   Level of Consciousness  --  Alert  --   SpO2 94 %  --   --   O2 Device Room Air  --   --   Assess: MEWS Score  MEWS Temp 2 2  --   MEWS Systolic 0 0  --   MEWS Pulse 1 1  --   MEWS RR 0 0  --   MEWS LOC 0 0  --   MEWS Score 3 3  --   MEWS Score Color Yellow Yellow  --   Assess: if the MEWS score is Yellow or Red  Were vital signs taken at a resting state? Yes  --   --   Focused Assessment Documented focused assessment  --   --   Early Detection of Sepsis Score *See Row Information* Medium  --   --   MEWS guidelines implemented *See Row Information* Yes  --   --   Treat  MEWS Interventions Administered prn meds/treatments  --   --   Take Vital Signs  Increase Vital Sign Frequency  Yellow: Q 2hr X 2 then Q 4hr X 2, if remains yellow, continue Q 4hrs  --   --   Escalate  MEWS: Escalate Red: discuss with charge nurse/RN and provider, consider discussing with RRT (alerted MD via secure text.)  --   --   Notify: Charge Nurse/RN  Name of Charge Nurse/RN Notified  --   --  Adelina Mings, RN  Date Charge Nurse/RN Notified  --   --  07/23/19  Time Charge Nurse/RN Notified  --   --  1815  Notify: Provider  Provider Name/Title sethi  --   --   Date Provider Notified 07/23/19  --   --   Time Provider Notified 1739  --   --   Notification Type Page  --   --   Notification Reason Change in status  --   --     07/23/19 1834  Assess: MEWS Score  Temp  --   BP  --   Pulse Rate  --   Level of Consciousness  --   SpO2  --   O2 Device  --   Assess: MEWS Score  MEWS Temp  --   MEWS Systolic  --   MEWS Pulse  --   MEWS RR  --   MEWS LOC  --   MEWS Score  --   MEWS Score Color  --   Assess: if the MEWS score is Yellow or Red  Were vital signs taken at a resting state?  --   Focused Assessment  --   Early Detection of Sepsis Score *See  Row Information*  --   MEWS guidelines implemented *See Row Information*  --   Treat  MEWS Interventions  --   Take Vital Signs  Increase Vital Sign Frequency   --   Escalate  MEWS: Escalate  --   Notify: Charge Nurse/RN  Name of Charge Nurse/RN Notified  --   Date Charge Nurse/RN Notified  --   Time Charge Nurse/RN Notified  --   Notify: Provider  Provider Name/Title sethi  Date Provider Notified 07/23/19  Time Provider Notified [phone]  Notification Type Page  Notification Reason  --

## 2019-07-23 NOTE — Progress Notes (Signed)
SLP Cancellation Note  Patient Details Name: MILAS SCHAPPELL MRN: [id number] DOB: 30-Aug-1955   Cancelled treatment:       Reason Eval/Treat Not Completed: Patient at procedure or test/unavailable (Pt with RN at this time. SLP will re-attempt on a subsequent date.)  Dvid Pendry I. Vear Clock, MS, CCC-SLP Acute Rehabilitation Services Office number [phone] Pager [phone]  Scheryl Marten 07/23/2019, 3:21 PM

## 2019-07-23 NOTE — Plan of Care (Signed)
°  Problem: Clinical Measurements: Goal: Will remain free from infection Outcome: Not Progressing  Patient requiring tylenol for temp of 102.   Problem: Nutrition: Goal: Adequate nutrition will be maintained Outcome: Progressing  Family and staff offering frequen tsupervision and assistance for meals and snacks.  Problem: Activity: Goal: Risk for activity intolerance will decrease Outcome: Progressing  Patient able to assist with turning in bed, denies SOB.

## 2019-07-23 NOTE — Progress Notes (Signed)
STROKE TEAM PROGRESS NOTE   INTERVAL HISTORY Patient condition is unchanged.  Y.  He still has left hemiparesis but it appears to be improving.  He is yet awaiting inpatient rehab and is medically stable to be transferred there but awaiting insurance approval and bed availability.  No complaints today.  Vital signs stable. Vitals:   07/22/19 2024 07/23/19 0025 07/23/19 0314 07/23/19 1000  BP: 122/61 104/67 95/64 112/73  Pulse: 70 68 61 73  Resp: 18 16 17 18   Temp: 98.6 F (37 C) 98.8 F (37.1 C) 98.2 F (36.8 C) 98.5 F (36.9 C)  TempSrc: Oral Oral  Oral  SpO2: 94% 96% 98% 96%  Weight:        CBC:  Recent Labs  Lab 07/19/19 1900 07/19/19 1900 07/19/19 1908 07/22/19 0419  WBC 7.2  --   --  8.5  NEUTROABS 4.2  --   --   --   HGB 12.2*   < > 12.2* 11.7*  HCT 37.4*   < > 36.0* 34.7*  MCV 94.7  --   --  91.8  PLT 228  --   --  202   < > = values in this interval not displayed.    Basic Metabolic Panel:  Recent Labs  Lab 07/19/19 1900 07/19/19 1900 07/19/19 1908 07/22/19 0419  NA 132*   < > 135 134*  K 3.8   < > 3.8 3.6  CL 99   < > 98 99  CO2 24  --   --  25  GLUCOSE 101*   < > 98 97  BUN <5*   < > 4* 9  CREATININE 0.71   < > 0.70 0.76  CALCIUM 8.3*  --   --  8.5*   < > = values in this interval not displayed.   Lipid Panel:     Component Value Date/Time   CHOL 143 07/14/2019 1207   CHOL 177 07/11/2017 0950   TRIG 148.0 07/14/2019 1207   HDL 35.90 (L) 07/14/2019 1207   HDL 96 07/11/2017 0950   CHOLHDL 4 07/14/2019 1207   VLDL 29.6 07/14/2019 1207   LDLCALC 78 07/14/2019 1207   LDLCALC 65 07/11/2017 0950   HgbA1c:  Lab Results  Component Value Date   HGBA1C 5.6 07/14/2019   Urine Drug Screen:     Component Value Date/Time   LABOPIA NONE DETECTED 07/20/2019 0005   COCAINSCRNUR NONE DETECTED 07/20/2019 0005   LABBENZ NONE DETECTED 07/20/2019 0005   AMPHETMU NONE DETECTED 07/20/2019 0005   THCU NONE DETECTED 07/20/2019 0005   LABBARB NONE DETECTED  07/20/2019 0005    Alcohol Level     Component Value Date/Time   ETH <10 07/19/2019 1900    IMAGING past 24 hours No results found.  PHYSICAL EXAM Pleasant middle-aged Caucasian male not in distress. . Afebrile. Head is nontraumatic. Neck is supple without bruit.    Cardiac exam no murmur or gallop. Lungs are clear to auscultation. Distal pulses are well felt. Neurological Exam :  Awake alert oriented to time place and person.  No aphasia or apraxia but has mild dysarthria.  Extraocular movements full range without nystagmus.  Blinks to threat bilaterally.  Left lower facial weakness.  Tongue midline.  Motor system exam shows left hemiplegia with left upper extremity 0/5 and left lower extremity 3/5 strength.  Normal strength on the right side.  Decreased left hemibody sensation.  Left plantar upgoing right downgoing.  Gait not tested.  ASSESSMENT/PLAN Mr.  [name] Cruz is a 64 y.o. male with history of deep vein thrombosis previously on Eliquis, history of hemorrhagic stroke secondary to hypertension with blood pressure 240 systolic, hypertension, hyperlipidemia presenting with L sided weakness.   Stroke:   R thalamocapsular hemorrhage - 2 separate lesions w/o significant HTN - concern for lesion  Code Stroke CT head R thalamocapsular hemorrhage w/ mild edema. Old L tempororparietal infarct. Small vessel disease. Atrophy. Sinus dz.  MRI w/w/o stable 2.6 cm right coronary radiata subcortical hemorrhage with a small focus of enhancement in the ventral medial aspect of the hemorrhage.  MRA no large vessel stenosis or occlusion.  Carotid Doppler no significant extracranial stenosis.  2D Echo normal ejection fraction.  No cardiac source of embolism.  Urine drug screen negative  LDL 78  HgbA1c 5.6  SCDs for VTE prophylaxis  aspirin 81 mg daily prior to admission, now on No antithrombotic given hmg  Therapy recommendations:  CIR  Disposition:  pending   Monitor acute  hemorrhage in ICU x 24h  Atrial Fibrillation  Home anticoagulation:  none   As per cardiology note, patient has PAF  Not on [facility] due to history of left MCA infarct with hemorrhagic conversion  Stroke in 06/2018 likely small vessel disease.  However, patient need to close follow-up with Dr. Pearlean Brownie to consider anticoagulation for stroke prevention if felt appropriate.   Hypertension  BP 158/88 on arrival  Initially treated w/ cleviprex, now off  Home meds:  Coreg 3.125 bid  Stable . Resume coreg . SBP goal < 140 x 24h . Long-term BP goal normotensive  Hyperlipidemia  Home meds:  lipitor 80  Hold statin in setting of acute hemorrhage  LDL 78  Plan to continue statin at discharge  Other Stroke Risk Factors  Hx Cigarette smoker  Hx ETOH use  Obesity, Body mass index is 33 kg/m., recommend weight loss, diet and exercise as appropriate   Hx stroke/TIA   07/2018 - punctate acute/subacute white matter infarct involving right forceps major, right PCA territory, likely due to small vessel disease. asa & plavix x 3 wks then plavix alone.   06/2018 incidental right cerebellum infarct secondary to small vessel disease.  CT head and neck right ICA 60% stenosis.  Left ICA less than 50% stenosis.  LDL 69 and A1c 5.6.  Increase aspirin from 81 to 325.  Lipitor 80 continued.  2004 - Left MCA infarct with hemorrhagic conversion   Coronary artery disease s/p CABG  Hx UE DVT  Other Active Problems  Baseline cognitive impairment  Seizures on vimpat, keppra and depakote. Last admitted 06/2019. Felt to originate from L MCA infarct in 2004  Hospital day # 4  Plan no changes in treatment plan today.  Continue ongoing therapies and transfer to inpatient rehab when bed available. Delia Heady, MD  To contact Stroke Continuity provider, please refer to [email or web address]. After hours, contact General Neurology

## 2019-07-24 ENCOUNTER — Inpatient Hospital Stay (HOSPITAL_COMMUNITY): Payer: Medicare Other

## 2019-07-24 DIAGNOSIS — R569 Unspecified convulsions: Secondary | ICD-10-CM

## 2019-07-24 DIAGNOSIS — Z8673 Personal history of transient ischemic attack (TIA), and cerebral infarction without residual deficits: Secondary | ICD-10-CM

## 2019-07-24 LAB — URINALYSIS, COMPLETE (UACMP) WITH MICROSCOPIC
Bacteria, UA: NONE SEEN
Bilirubin Urine: NEGATIVE
Glucose, UA: NEGATIVE mg/dL
Hgb urine dipstick: NEGATIVE
Ketones, ur: 5 mg/dL — AB
Leukocytes,Ua: NEGATIVE
Nitrite: NEGATIVE
Protein, ur: NEGATIVE mg/dL
Specific Gravity, Urine: 1.021 (ref 1.005–1.030)
pH: 6 (ref 5.0–8.0)

## 2019-07-24 LAB — PROCALCITONIN: Procalcitonin: 1.12 ng/mL

## 2019-07-24 LAB — C-REACTIVE PROTEIN: CRP: 8.1 mg/dL — ABNORMAL HIGH (ref <1.0)

## 2019-07-24 MED ORDER — CLOPIDOGREL BISULFATE 75 MG PO TABS: 75.0000 mg | ORAL_TABLET | Freq: Every day | ORAL | Status: DC

## 2019-07-24 NOTE — Progress Notes (Addendum)
STROKE TEAM PROGRESS NOTE   INTERVAL HISTORY No family at bedside. Pt lying in bed, orientated to place but not to time or age. Still has left hemiplegia. Yesterday afternoon T 102.1, but today afebrile  Vitals:   07/23/19 1834 07/23/19 1957 07/23/19 2304 07/24/19 0336  BP:  123/76 120/77 118/73  Pulse:  86 83 81  Resp:  18 18 18   Temp: 99.7 F (37.6 C) 99.2 F (37.3 C) 99 F (37.2 C) 99.1 F (37.3 C)  TempSrc: Oral Oral Oral Oral  SpO2:  98% 98% 98%  Weight:        CBC:  Recent Labs  Lab 07/19/19 1900 07/19/19 1900 07/19/19 1908 07/22/19 0419  WBC 7.2  --   --  8.5  NEUTROABS 4.2  --   --   --   HGB 12.2*   < > 12.2* 11.7*  HCT 37.4*   < > 36.0* 34.7*  MCV 94.7  --   --  91.8  PLT 228  --   --  202   < > = values in this interval not displayed.    Basic Metabolic Panel:  Recent Labs  Lab 07/19/19 1900 07/19/19 1900 07/19/19 1908 07/22/19 0419  NA 132*   < > 135 134*  K 3.8   < > 3.8 3.6  CL 99   < > 98 99  CO2 24  --   --  25  GLUCOSE 101*   < > 98 97  BUN <5*   < > 4* 9  CREATININE 0.71   < > 0.70 0.76  CALCIUM 8.3*  --   --  8.5*   < > = values in this interval not displayed.   Lipid Panel:     Component Value Date/Time   CHOL 143 07/14/2019 1207   CHOL 177 07/11/2017 0950   TRIG 148.0 07/14/2019 1207   HDL 35.90 (L) 07/14/2019 1207   HDL 96 07/11/2017 0950   CHOLHDL 4 07/14/2019 1207   VLDL 29.6 07/14/2019 1207   LDLCALC 78 07/14/2019 1207   LDLCALC 65 07/11/2017 0950   HgbA1c:  Lab Results  Component Value Date   HGBA1C 5.6 07/14/2019   Urine Drug Screen:     Component Value Date/Time   LABOPIA NONE DETECTED 07/20/2019 0005   COCAINSCRNUR NONE DETECTED 07/20/2019 0005   LABBENZ NONE DETECTED 07/20/2019 0005   AMPHETMU NONE DETECTED 07/20/2019 0005   THCU NONE DETECTED 07/20/2019 0005   LABBARB NONE DETECTED 07/20/2019 0005    Alcohol Level     Component Value Date/Time   ETH <10 07/19/2019 1900    IMAGING past 24 hours DG  Chest 2 View  Result Date: 07/23/2019 CLINICAL DATA:  Febrile, intracranial hemorrhage EXAM: CHEST - 2 VIEW COMPARISON:  03/25/2018 FINDINGS: Frontal and lateral views of the chest demonstrate an unremarkable cardiac silhouette. Postsurgical changes are seen from median sternotomy. No airspace disease, effusion, or pneumothorax. No acute bony abnormality. IMPRESSION: 1. No acute intrathoracic process. Electronically Signed   By: 05/25/2018 M.D.   On: 07/23/2019 20:32    PHYSICAL EXAM  Temp:  [98.6 F (37 C)-102.1 F (38.9 C)] 98.6 F (37 C) (07/10 1324) Pulse Rate:  [76-102] 80 (07/10 1324) Resp:  [18] 18 (07/10 1324) BP: (97-123)/(56-83) 116/70 (07/10 1324) SpO2:  [94 %-98 %] 98 % (07/10 1324)  General - Well nourished, well developed, in no apparent distress.  Ophthalmologic - fundi not visualized due to noncooperation.  Cardiovascular - Regular rhythm  and rate.  Neuro - Awake alert oriented to place but not to time or age.  No aphasia or apraxia but has mild dysarthria.  Extraocular movements full range without nystagmus.  Visual field full.  Left lower facial weakness.  Tongue midline.  Motor system exam shows left hemiplegia with left upper extremity 0/5 and left lower extremity 3/5 proximal but 0/5 foot DF/PF.  Normal strength on the right side.  Decreased left hemibody sensation.  Left positive babinski.  Gait not tested.  ASSESSMENT/PLAN [name] is a 64 y.o. male with history of deep vein thrombosis previously on Eliquis, history of hemorrhagic stroke secondary to hypertension with blood pressure 240 systolic, hypertension, hyperlipidemia presenting with L sided weakness.   ICH - R BG hemorrhage but w/o significant HTN, concerning for hemorrhagic conversion given hx of AF  CT head R thalamocapsular hemorrhage w/ mild edema. Old L tempororparietal infarct.   MRI w/w/o stable 2.6 cm right BG hemorrhage  MRA no large vessel stenosis or occlusion.  Carotid Doppler  no significant extracranial stenosis.  2D Echo normal ejection fraction.  No cardiac source of embolism.  UDS negative  LDL 78  HgbA1c 5.6  SCDs for VTE prophylaxis  aspirin 81 mg daily prior to admission, now on No antithrombotic given hmg  Therapy recommendations:  CIR  Disposition:  pending   Atrial Fibrillation  Home anticoagulation:  none   As per cardiology note, patient has PAF  History of left MCA infarct with hemorrhagic conversion - which should not be the contraindication for [facility]  Stroke in 06/2018 likely small vessel disease.  Patient needs to close outpt follow-up with Dr. Pearlean Brownie to consider anticoagulation for stroke prevention once ICH resolves   Hx stroke/TIA   07/2018 - punctate acute/subacute white matter infarct involving right forceps major, right PCA territory. Put on asa & plavix x 3 wks then plavix alone.   06/2018 incidental right cerebellum infarct secondary to small vessel disease.  CT head and neck right ICA 60% stenosis.  Left ICA < 50% stenosis.  LDL 69 and A1c 5.6.  Increase aspirin from 81 to 325.  Lipitor 80 continued.  2004 - Left MCA infarct with hemorrhagic conversion   Seizure  on vimpat, keppra and depakote - continue    Last admitted 06/2019.   Felt to originate from L MCA infarct in 2004  Spike fever  One episode of fever 102.1 on 07/23/19  Blood culture pending  UA neg  CXR neg  Hypertension  BP 158/88 on arrival  Initially treated w/ cleviprex, now off  Home meds:  Coreg 3.125 bid  Stable . Resume coreg . SBP goal < 160 . Long-term BP goal normotensive  Hyperlipidemia  Home meds:  lipitor 80  Hold statin in setting of acute hemorrhage  LDL 78  Plan to continue statin at discharge  Other Stroke Risk Factors  Hx Cigarette smoker  Hx ETOH use  Obesity, Body mass index is 33 kg/m., recommend weight loss, diet and exercise as appropriate   Coronary artery disease s/p CABG  Hx UE DVT  Other Active  Problems  Baseline cognitive impairment - ? Vascular dementia  Hospital day # 5  Marvel Plan, MD PhD Stroke Neurology 07/24/2019 3:00 PM    To contact Stroke Continuity provider, please refer to [email or web address]. After hours, contact General Neurology

## 2019-07-25 DIAGNOSIS — I10 Essential (primary) hypertension: Secondary | ICD-10-CM

## 2019-07-25 DIAGNOSIS — I480 Paroxysmal atrial fibrillation: Secondary | ICD-10-CM

## 2019-07-25 LAB — CBC
HCT: 38.6 % — ABNORMAL LOW (ref 39.0–52.0)
Hemoglobin: 12.8 g/dL — ABNORMAL LOW (ref 13.0–17.0)
MCH: 31.1 pg (ref 26.0–34.0)
MCHC: 33.2 g/dL (ref 30.0–36.0)
MCV: 93.7 fL (ref 80.0–100.0)
Platelets: 122 10*3/uL — ABNORMAL LOW (ref 150–400)
RBC: 4.12 MIL/uL — ABNORMAL LOW (ref 4.22–5.81)
RDW: 12.5 % (ref 11.5–15.5)
WBC: 5.8 10*3/uL (ref 4.0–10.5)
nRBC: 0 % (ref 0.0–0.2)

## 2019-07-25 LAB — MAGNESIUM: Magnesium: 2.1 mg/dL (ref 1.7–2.4)

## 2019-07-25 LAB — BASIC METABOLIC PANEL
Anion gap: 9 (ref 5–15)
BUN: 11 mg/dL (ref 8–23)
CO2: 26 mmol/L (ref 22–32)
Calcium: 8.6 mg/dL — ABNORMAL LOW (ref 8.9–10.3)
Chloride: 101 mmol/L (ref 98–111)
Creatinine, Ser: 0.68 mg/dL (ref 0.61–1.24)
GFR calc Af Amer: >60 mL/min (ref >60)
GFR calc non Af Amer: >60 mL/min (ref >60)
Glucose, Bld: 86 mg/dL (ref 70–99)
Potassium: 4.3 mmol/L (ref 3.5–5.1)
Sodium: 136 mmol/L (ref 135–145)

## 2019-07-25 LAB — C-REACTIVE PROTEIN: CRP: 8.1 mg/dL — ABNORMAL HIGH (ref <1.0)

## 2019-07-25 LAB — BRAIN NATRIURETIC PEPTIDE: B Natriuretic Peptide: 59.9 pg/mL (ref 0.0–100.0)

## 2019-07-25 LAB — PROCALCITONIN: Procalcitonin: 1.16 ng/mL

## 2019-07-25 NOTE — Progress Notes (Signed)
STROKE TEAM PROGRESS NOTE   INTERVAL HISTORY Sisters are at bedside. Pt lying in bed, awake alert, no acute event overnight and no complains. Pending CIR admission and insurance approval. No fever.   Vitals:   07/24/19 2329 07/25/19 0336 07/25/19 0900 07/25/19 1359  BP: (!) 99/59 99/73 107/68 119/77  Pulse: 66 60 81 73  Resp: 16 18  18   Temp: 98.6 F (37 C) 97.6 F (36.4 C)  98.6 F (37 C)  TempSrc: Oral Oral  Oral  SpO2: 97% 96%  98%  Weight:        CBC:  Recent Labs  Lab 07/19/19 1900 07/19/19 1908 07/22/19 0419 07/25/19 0431  WBC 7.2   < > 8.5 5.8  NEUTROABS 4.2  --   --   --   HGB 12.2*   < > 11.7* 12.8*  HCT 37.4*   < > 34.7* 38.6*  MCV 94.7   < > 91.8 93.7  PLT 228   < > 202 122*   < > = values in this interval not displayed.    Basic Metabolic Panel:  Recent Labs  Lab 07/22/19 0419 07/25/19 0431  NA 134* 136  K 3.6 4.3  CL 99 101  CO2 25 26  GLUCOSE 97 86  BUN 9 11  CREATININE 0.76 0.68  CALCIUM 8.5* 8.6*  MG  --  2.1   Lipid Panel:     Component Value Date/Time   CHOL 143 07/14/2019 1207   CHOL 177 07/11/2017 0950   TRIG 148.0 07/14/2019 1207   HDL 35.90 (L) 07/14/2019 1207   HDL 96 07/11/2017 0950   CHOLHDL 4 07/14/2019 1207   VLDL 29.6 07/14/2019 1207   LDLCALC 78 07/14/2019 1207   LDLCALC 65 07/11/2017 0950   HgbA1c:  Lab Results  Component Value Date   HGBA1C 5.6 07/14/2019   Urine Drug Screen:     Component Value Date/Time   LABOPIA NONE DETECTED 07/20/2019 0005   COCAINSCRNUR NONE DETECTED 07/20/2019 0005   LABBENZ NONE DETECTED 07/20/2019 0005   AMPHETMU NONE DETECTED 07/20/2019 0005   THCU NONE DETECTED 07/20/2019 0005   LABBARB NONE DETECTED 07/20/2019 0005    Alcohol Level     Component Value Date/Time   ETH <10 07/19/2019 1900    IMAGING past 24 hours DG Chest Port 1 View  Result Date: 07/24/2019 CLINICAL DATA:  Short of breath, hypertension EXAM: PORTABLE CHEST 1 VIEW COMPARISON:  07/23/2019 FINDINGS: Single  frontal view of the chest demonstrates a stable cardiac silhouette. No airspace disease, effusion, or pneumothorax. Median sternotomy wires unchanged. IMPRESSION: 1. Stable exam, no acute process. Electronically Signed   By: 09/23/2019 M.D.   On: 07/24/2019 18:15    PHYSICAL EXAM  Temp:  [97.6 F (36.4 C)-100.2 F (37.9 C)] 98.6 F (37 C) (07/11 1359) Pulse Rate:  [60-81] 73 (07/11 1359) Resp:  [16-18] 18 (07/11 1359) BP: (99-119)/(59-77) 119/77 (07/11 1359) SpO2:  [96 %-99 %] 98 % (07/11 1359)  General - Well nourished, well developed, in no apparent distress.  Ophthalmologic - fundi not visualized due to noncooperation.  Cardiovascular - Regular rhythm and rate.  Neuro - Awake alert oriented to place but not to time or age.  No aphasia or apraxia but has mild dysarthria.  Extraocular movements full range without nystagmus.  Visual field full.  Left lower facial weakness.  Tongue midline.  Motor system exam shows left hemiplegia with left upper extremity 0/5 and left lower extremity 3/5 proximal but  0/5 foot DF/PF.  Normal strength on the right side.  Decreased left hemibody sensation.  Left positive babinski.  Gait not tested.  ASSESSMENT/PLAN Mr. HUMZAH HARTY is a 64 y.o. male with history of deep vein thrombosis previously on Eliquis, history of hemorrhagic stroke secondary to hypertension with blood pressure 240 systolic, hypertension, hyperlipidemia presenting with L sided weakness.   ICH - R BG hemorrhage but w/o significant HTN, concerning for hemorrhagic conversion given hx of AF  CT head R thalamocapsular hemorrhage w/ mild edema. Old L tempororparietal infarct.   MRI w/w/o stable 2.6 cm right BG hemorrhage  MRA no large vessel stenosis or occlusion.  Carotid Doppler no significant extracranial stenosis.  2D Echo normal ejection fraction.  No cardiac source of embolism.  UDS negative  LDL 78  HgbA1c 5.6  SCDs for VTE prophylaxis  aspirin 81 mg daily prior  to admission, now on No antithrombotic given hmg  Therapy recommendations:  CIR  Disposition:  pending   Atrial Fibrillation  Home anticoagulation:  none   As per cardiology note, patient has PAF  History of left MCA infarct with hemorrhagic conversion - which should not be the contraindication for [facility] in the future  Stroke in 06/2018 likely small vessel disease.  Patient needs to close outpt follow-up with Dr. Pearlean Brownie to consider anticoagulation for stroke prevention once ICH resolves   Hx stroke/TIA   07/2018 - punctate acute/subacute white matter infarct involving right forceps major, right PCA territory. Put on asa & plavix x 3 wks then plavix alone.   06/2018 incidental right cerebellum infarct secondary to small vessel disease.  CT head and neck right ICA 60% stenosis.  Left ICA < 50% stenosis.  LDL 69 and A1c 5.6.  Increase aspirin from 81 to 325.  Lipitor 80 continued.  2004 - Left MCA infarct with hemorrhagic conversion   Seizure  on vimpat, keppra and depakote - continue    Last admitted 06/2019 for status epilepticus.   Felt to originate from L MCA infarct in 2004  Spike fever  One episode of fever 102.1 on 07/23/19  No afebrile  WBCs - 5.8  Blood culture - NGTD  UA neg  CXR neg  Hypertension  BP 158/88 on arrival  Initially treated w/ cleviprex, now off  Home meds:  Coreg 3.125 bid  Stable on the low end  . Resume coreg -> now discontinued . SBP goal < 160 . Long-term BP goal normotensive  Hyperlipidemia  Home meds:  lipitor 80  Hold statin in setting of acute hemorrhage  LDL 78  Recommend to continue statin at discharge  Other Stroke Risk Factors  Hx Cigarette smoker  Hx ETOH use  Obesity, Body mass index is 33 kg/m., recommend weight loss, diet and exercise as appropriate   Coronary artery disease s/p CABG  Hx UE DVT  Other Active Problems  Baseline cognitive impairment - ? Vascular dementia  Hospital day # 6  Neurology  will sign off. Please call with questions. Pt will follow up with stroke clinic Dr. Pearlean Brownie at [facility] in about 4 weeks. Thanks for the consult.  Marvel Plan, MD PhD Stroke Neurology 07/25/2019 3:38 PM   To contact Stroke Continuity provider, please refer to [email or web address]. After hours, contact General Neurology

## 2019-07-25 NOTE — Progress Notes (Signed)
PROGRESS NOTE  [name] Cruz Burbano ZOX:[id number] DOB: 1955-10-13 DOA: 07/19/2019 PCP: [name], [name], MD  HPI/Recap of past 24 hours: HPI from Dr Aroor [name] Cruz Finklea is a 64 y.o. male with past medical history of deep vein thrombosis previously on Eliquis, history of hemorrhagic stroke secondary to hypertension with blood pressure of 240 systolic, hypertension, hyperlipidemia presents to the emergency department with sudden onset left-sided weakness. Brought to the ED by EMS as code stroke. Stat CT obtained showed right thalamic hemorrhage. Blood pressure 145/118 with right BP cuff and 158 x 85 and left BP cuff.  Patient received labetalol and blood pressure remain high and so patient was started on Cleviprex drip. Neurology admitted patient to neuro ICU. [facility] hospitalist asked to assume care on 07/25/2019 for further management of fever that was noted on 07/23/2019 and further rehab needs.    Today, patient denies any new complaints.  Denied any headache, chest pain, shortness of breath, abdominal pain, nausea/vomiting, dysuria, fever/chills.    Assessment/Plan: Active Problems:   ICH (intracerebral hemorrhage) (HCC)  Acute right basal ganglia ICH Concern for hemorrhagic conversion given history of A. fib on Eliquis MRI showed stable 2.6 cm right basal ganglia hemorrhage CT head showed right thalamocapsular hemorrhage with mild edema Echo with normal EF, no cardiac source of embolism UDS negative LDL 78, A1c 5.6 PT/OT recommend CIR Monitor closely  SIRS Noted fever on 07/23/2019, none since then Currently afebrile, with no leukocytosis UA negative, chest x-ray unremarkable Procalcitonin, CRP elevated BCX2 no growth to date Daily CBC, monitor closely  Paroxysmal A. Fib Rate controlled Not on any anticoagulation at home, plan to follow-up with Dr. Pearlean BrownieSethi to consider anticoagulation for stroke prevention once ICH resolves  History of seizure Continue Keppra, Depakote, ziprasidone,  lacosamide  Hypertension Stable, weaned off cleviprex Coreg on hold Monitor closely  Hyperlipidemia Hold Lipitor, statins upon discharge  Obesity Lifestyle modification advised  Possible mild cognitive impairment/vascular dementia Monitor closely       Malnutrition Type:      Malnutrition Characteristics:      Nutrition Interventions:       Estimated body mass index is 33 kg/m as calculated from the following:   Height as of 07/14/19: 5\' 3"  (1.6 m).   Weight as of this encounter: 84.5 kg.     Code Status: Full  Family Communication: Discussed extensively with patient  Disposition Plan: Status is: Inpatient  Remains inpatient appropriate because:Inpatient level of care appropriate due to severity of illness   Dispo: The patient is from: Home              Anticipated d/c is to: CIR              Anticipated d/c date is: 1 day              Patient currently is medically stable to d/c.    Consultants:  Neurology  Procedures: None  Antimicrobials:  None  DVT prophylaxis: SCDs   Objective: Vitals:   07/24/19 2329 07/25/19 0336 07/25/19 0900 07/25/19 1359  BP: (!) 99/59 99/73 107/68 119/77  Pulse: 66 60 81 73  Resp: 16 18  18   Temp: 98.6 F (37 C) 97.6 F (36.4 C)  98.6 F (37 C)  TempSrc: Oral Oral  Oral  SpO2: 97% 96%  98%  Weight:        Intake/Output Summary (Last 24 hours) at 07/25/2019 1401 Last data filed at 07/25/2019 0832 Gross per 24 hour  Intake 240  ml  Output 1101 ml  Net -861 ml   Filed Weights   07/19/19 1913  Weight: 84.5 kg    Exam:  General: NAD, left lower facial weakness  Cardiovascular: S1, S2 present  Respiratory: CTAB  Abdomen: Soft, nontender, nondistended, bowel sounds present  Musculoskeletal: No bilateral pedal edema noted  Skin: Normal  Psychiatry: Normal mood  Neurology: Strength normal on the right side, 0/5 left upper extremity, 3/5 left lower extremity   Data  Reviewed: CBC: Recent Labs  Lab 07/19/19 1900 07/19/19 1908 07/22/19 0419 07/25/19 0431  WBC 7.2  --  8.5 5.8  NEUTROABS 4.2  --   --   --   HGB 12.2* 12.2* 11.7* 12.8*  HCT 37.4* 36.0* 34.7* 38.6*  MCV 94.7  --  91.8 93.7  PLT 228  --  202 122*   Basic Metabolic Panel: Recent Labs  Lab 07/19/19 1900 07/19/19 1908 07/22/19 0419 07/25/19 0431  NA 132* 135 134* 136  K 3.8 3.8 3.6 4.3  CL 99 98 99 101  CO2 24  --  25 26  GLUCOSE 101* 98 97 86  BUN <5* 4* 9 11  CREATININE 0.71 0.70 0.76 0.68  CALCIUM 8.3*  --  8.5* 8.6*  MG  --   --   --  2.1   GFR: Estimated Creatinine Clearance: 89.6 mL/min (by C-G formula based on SCr of 0.68 mg/dL). Liver Function Tests: Recent Labs  Lab 07/19/19 1900  AST 17  ALT 14  ALKPHOS 42  BILITOT 0.5  PROT 6.0*  ALBUMIN 3.6   No results for input(s): LIPASE, AMYLASE in the last 168 hours. No results for input(s): AMMONIA in the last 168 hours. Coagulation Profile: Recent Labs  Lab 07/19/19 1900  INR 1.1   Cardiac Enzymes: No results for input(s): CKTOTAL, CKMB, CKMBINDEX, TROPONINI in the last 168 hours. BNP (last 3 results) No results for input(s): PROBNP in the last 8760 hours. HbA1C: No results for input(s): HGBA1C in the last 72 hours. CBG: Recent Labs  Lab 07/19/19 1900  GLUCAP 96   Lipid Profile: No results for input(s): CHOL, HDL, LDLCALC, TRIG, CHOLHDL, LDLDIRECT in the last 72 hours. Thyroid Function Tests: No results for input(s): TSH, T4TOTAL, FREET4, T3FREE, THYROIDAB in the last 72 hours. Anemia Panel: No results for input(s): VITAMINB12, FOLATE, FERRITIN, TIBC, IRON, RETICCTPCT in the last 72 hours. Urine analysis:    Component Value Date/Time   COLORURINE YELLOW 07/24/2019 0645   APPEARANCEUR CLEAR 07/24/2019 0645   LABSPEC 1.021 07/24/2019 0645   PHURINE 6.0 07/24/2019 0645   GLUCOSEU NEGATIVE 07/24/2019 0645   HGBUR NEGATIVE 07/24/2019 0645   BILIRUBINUR NEGATIVE 07/24/2019 0645   KETONESUR 5  (A) 07/24/2019 0645   PROTEINUR NEGATIVE 07/24/2019 0645   UROBILINOGEN 0.2 04/26/2012 1900   NITRITE NEGATIVE 07/24/2019 0645   LEUKOCYTESUR NEGATIVE 07/24/2019 0645   Sepsis Labs: @LABRCNTIP (procalcitonin:4,lacticidven:4)  ) Recent Results (from the past 240 hour(s))  SARS Coronavirus 2 by RT PCR (hospital order, performed in [facility] hospital lab) Nasopharyngeal Nasopharyngeal Swab     Status: None   Collection Time: 07/19/19  7:39 PM   Specimen: Nasopharyngeal Swab  Result Value Ref Range Status   SARS Coronavirus 2 NEGATIVE NEGATIVE Final    Comment: (NOTE) SARS-CoV-2 target nucleic acids are NOT DETECTED.  The SARS-CoV-2 RNA is generally detectable in upper and lower respiratory specimens during the acute phase of infection. The lowest concentration of SARS-CoV-2 viral copies this assay can detect is 250 copies /  mL. A negative result does not preclude SARS-CoV-2 infection and should not be used as the sole basis for treatment or other patient management decisions.  A negative result may occur with improper specimen collection / handling, submission of specimen other than nasopharyngeal swab, presence of viral mutation(s) within the areas targeted by this assay, and inadequate number of viral copies (<250 copies / mL). A negative result must be combined with clinical observations, patient history, and epidemiological information.  Fact Sheet for Patients:   BoilerBrush.com.cy  Fact Sheet for Healthcare Providers: https://pope.com/  This test is not yet approved or  cleared by the [place] FDA and has been authorized for detection and/or diagnosis of SARS-CoV-2 by FDA under an Emergency Use Authorization (EUA).  This EUA will remain in effect (meaning this test can be used) for the duration of the COVID-19 declaration under Section 564(b)(1) of the Act, 21 U.S.C. section 360bbb-3(b)(1), unless the authorization is  terminated or revoked sooner.  Performed at [facility], [address], [place], [place] [address]   MRSA PCR Screening     Status: None   Collection Time: 07/19/19  8:14 PM   Specimen: Nasal Mucosa; Nasopharyngeal  Result Value Ref Range Status   MRSA by PCR NEGATIVE NEGATIVE Final    Comment:        The GeneXpert MRSA Assay (FDA approved for NASAL specimens only), is one component of a comprehensive MRSA colonization surveillance program. It is not intended to diagnose MRSA infection nor to guide or monitor treatment for MRSA infections. Performed at [facility], [address], [place], [place] [address]   Culture, blood (routine x 2)     Status: None (Preliminary result)   Collection Time: 07/23/19  8:35 PM   Specimen: BLOOD  Result Value Ref Range Status   Specimen Description BLOOD RIGHT ANTECUBITAL  Final   Special Requests   Final    BOTTLES DRAWN AEROBIC AND ANAEROBIC Blood Culture adequate volume   Culture   Final    NO GROWTH < 24 HOURS Performed at [facility], [address] [place], [place] [address]    Report Status PENDING  Incomplete  Culture, blood (routine x 2)     Status: None (Preliminary result)   Collection Time: 07/23/19  8:43 PM   Specimen: BLOOD RIGHT HAND  Result Value Ref Range Status   Specimen Description BLOOD RIGHT HAND  Final   Special Requests   Final    BOTTLES DRAWN AEROBIC ONLY Blood Culture results may not be optimal due to an inadequate volume of blood received in culture bottles   Culture   Final    NO GROWTH < 24 HOURS Performed at [facility], [address] [place], [place] [address]    Report Status PENDING  Incomplete      Studies: DG Chest Port 1 View  Result Date: 07/24/2019 CLINICAL DATA:  Short of breath, hypertension EXAM: PORTABLE CHEST 1 VIEW COMPARISON:  07/23/2019 FINDINGS: Single frontal view of the chest demonstrates a stable cardiac silhouette. No airspace disease, effusion, or  pneumothorax. Median sternotomy wires unchanged. IMPRESSION: 1. Stable exam, no acute process. Electronically Signed   By: Sharlet Salina M.D.   On: 07/24/2019 18:15    Scheduled Meds: .  stroke: mapping our early stages of recovery book   Does not apply Once  . Chlorhexidine Gluconate Cloth  6 each Topical Daily  . divalproex  500 mg Oral BID  .  lacosamide  200 mg Oral 2 times per day  . levETIRAcetam  1,500 mg Oral BID  . mouth rinse  15 mL Mouth Rinse BID  . multivitamin with minerals  1 tablet Oral Daily  . pantoprazole  40 mg Oral QHS  . senna-docusate  1 tablet Oral BID  . sertraline  25 mg Oral Daily  . ziprasidone  40 mg Oral QHS    Continuous Infusions:   LOS: 6 days     Briant Cedar, MD [facility]  If 7PM-7AM, please contact night-coverage [email or web address] 07/25/2019, 2:01 PM

## 2019-07-26 ENCOUNTER — Inpatient Hospital Stay (HOSPITAL_COMMUNITY)
Admission: RE | Admit: 2019-07-26 | Discharge: 2019-08-17 | DRG: 057 | Disposition: A | Discharge status: Skilled Nursing Facility | Payer: Medicare Other | Source: Intra-hospital | Attending: Physical Medicine & Rehabilitation | Admitting: Physical Medicine & Rehabilitation

## 2019-07-26 DIAGNOSIS — R1310 Dysphagia, unspecified: Secondary | ICD-10-CM | POA: Diagnosis present

## 2019-07-26 DIAGNOSIS — M2550 Pain in unspecified joint: Secondary | ICD-10-CM | POA: Diagnosis not present

## 2019-07-26 DIAGNOSIS — I10 Essential (primary) hypertension: Secondary | ICD-10-CM

## 2019-07-26 DIAGNOSIS — N390 Urinary tract infection, site not specified: Secondary | ICD-10-CM | POA: Diagnosis present

## 2019-07-26 DIAGNOSIS — I619 Nontraumatic intracerebral hemorrhage, unspecified: Secondary | ICD-10-CM | POA: Diagnosis not present

## 2019-07-26 DIAGNOSIS — Z7401 Bed confinement status: Secondary | ICD-10-CM | POA: Diagnosis not present

## 2019-07-26 DIAGNOSIS — I610 Nontraumatic intracerebral hemorrhage in hemisphere, subcortical: Secondary | ICD-10-CM | POA: Diagnosis not present

## 2019-07-26 DIAGNOSIS — I69828 Other speech and language deficits following other cerebrovascular disease: Secondary | ICD-10-CM | POA: Diagnosis not present

## 2019-07-26 DIAGNOSIS — I69054 Hemiplegia and hemiparesis following nontraumatic subarachnoid hemorrhage affecting left non-dominant side: Secondary | ICD-10-CM | POA: Diagnosis not present

## 2019-07-26 DIAGNOSIS — E669 Obesity, unspecified: Secondary | ICD-10-CM | POA: Diagnosis present

## 2019-07-26 DIAGNOSIS — D696 Thrombocytopenia, unspecified: Secondary | ICD-10-CM | POA: Diagnosis not present

## 2019-07-26 DIAGNOSIS — G40909 Epilepsy, unspecified, not intractable, without status epilepticus: Secondary | ICD-10-CM | POA: Diagnosis present

## 2019-07-26 DIAGNOSIS — Z8673 Personal history of transient ischemic attack (TIA), and cerebral infarction without residual deficits: Secondary | ICD-10-CM

## 2019-07-26 DIAGNOSIS — I499 Cardiac arrhythmia, unspecified: Secondary | ICD-10-CM | POA: Diagnosis not present

## 2019-07-26 DIAGNOSIS — I69391 Dysphagia following cerebral infarction: Secondary | ICD-10-CM | POA: Diagnosis not present

## 2019-07-26 DIAGNOSIS — Z951 Presence of aortocoronary bypass graft: Secondary | ICD-10-CM | POA: Diagnosis not present

## 2019-07-26 DIAGNOSIS — I69218 Other symptoms and signs involving cognitive functions following other nontraumatic intracranial hemorrhage: Secondary | ICD-10-CM

## 2019-07-26 DIAGNOSIS — I609 Nontraumatic subarachnoid hemorrhage, unspecified: Secondary | ICD-10-CM | POA: Diagnosis not present

## 2019-07-26 DIAGNOSIS — I629 Nontraumatic intracranial hemorrhage, unspecified: Secondary | ICD-10-CM | POA: Diagnosis not present

## 2019-07-26 DIAGNOSIS — I2510 Atherosclerotic heart disease of native coronary artery without angina pectoris: Secondary | ICD-10-CM | POA: Diagnosis not present

## 2019-07-26 DIAGNOSIS — R0989 Other specified symptoms and signs involving the circulatory and respiratory systems: Secondary | ICD-10-CM

## 2019-07-26 DIAGNOSIS — I480 Paroxysmal atrial fibrillation: Secondary | ICD-10-CM | POA: Diagnosis present

## 2019-07-26 DIAGNOSIS — Z743 Need for continuous supervision: Secondary | ICD-10-CM | POA: Diagnosis not present

## 2019-07-26 DIAGNOSIS — E871 Hypo-osmolality and hyponatremia: Secondary | ICD-10-CM | POA: Diagnosis not present

## 2019-07-26 DIAGNOSIS — I69191 Dysphagia following nontraumatic intracerebral hemorrhage: Secondary | ICD-10-CM

## 2019-07-26 DIAGNOSIS — I69022 Dysarthria following nontraumatic subarachnoid hemorrhage: Secondary | ICD-10-CM | POA: Diagnosis not present

## 2019-07-26 DIAGNOSIS — A499 Bacterial infection, unspecified: Secondary | ICD-10-CM | POA: Diagnosis not present

## 2019-07-26 DIAGNOSIS — I69354 Hemiplegia and hemiparesis following cerebral infarction affecting left non-dominant side: Secondary | ICD-10-CM | POA: Diagnosis not present

## 2019-07-26 DIAGNOSIS — Z6833 Body mass index (BMI) 33.0-33.9, adult: Secondary | ICD-10-CM | POA: Diagnosis not present

## 2019-07-26 DIAGNOSIS — B957 Other staphylococcus as the cause of diseases classified elsewhere: Secondary | ICD-10-CM | POA: Diagnosis not present

## 2019-07-26 DIAGNOSIS — Z86718 Personal history of other venous thrombosis and embolism: Secondary | ICD-10-CM | POA: Diagnosis not present

## 2019-07-26 DIAGNOSIS — E785 Hyperlipidemia, unspecified: Secondary | ICD-10-CM | POA: Diagnosis not present

## 2019-07-26 DIAGNOSIS — K219 Gastro-esophageal reflux disease without esophagitis: Secondary | ICD-10-CM | POA: Diagnosis not present

## 2019-07-26 DIAGNOSIS — R1312 Dysphagia, oropharyngeal phase: Secondary | ICD-10-CM | POA: Diagnosis not present

## 2019-07-26 DIAGNOSIS — R159 Full incontinence of feces: Secondary | ICD-10-CM | POA: Diagnosis present

## 2019-07-26 DIAGNOSIS — N319 Neuromuscular dysfunction of bladder, unspecified: Secondary | ICD-10-CM | POA: Diagnosis not present

## 2019-07-26 DIAGNOSIS — Z20822 Contact with and (suspected) exposure to covid-19: Secondary | ICD-10-CM | POA: Diagnosis present

## 2019-07-26 DIAGNOSIS — G459 Transient cerebral ischemic attack, unspecified: Secondary | ICD-10-CM | POA: Diagnosis not present

## 2019-07-26 DIAGNOSIS — M6281 Muscle weakness (generalized): Secondary | ICD-10-CM | POA: Diagnosis not present

## 2019-07-26 DIAGNOSIS — B370 Candidal stomatitis: Secondary | ICD-10-CM | POA: Diagnosis present

## 2019-07-26 DIAGNOSIS — I69891 Dysphagia following other cerebrovascular disease: Secondary | ICD-10-CM | POA: Diagnosis not present

## 2019-07-26 LAB — BASIC METABOLIC PANEL
Anion gap: 8 (ref 5–15)
BUN: 11 mg/dL (ref 8–23)
CO2: 28 mmol/L (ref 22–32)
Calcium: 8.6 mg/dL — ABNORMAL LOW (ref 8.9–10.3)
Chloride: 100 mmol/L (ref 98–111)
Creatinine, Ser: 0.69 mg/dL (ref 0.61–1.24)
GFR calc Af Amer: >60 mL/min (ref >60)
GFR calc non Af Amer: >60 mL/min (ref >60)
Glucose, Bld: 96 mg/dL (ref 70–99)
Potassium: 4 mmol/L (ref 3.5–5.1)
Sodium: 136 mmol/L (ref 135–145)

## 2019-07-26 LAB — CBC
HCT: 35.4 % — ABNORMAL LOW (ref 39.0–52.0)
Hemoglobin: 11.8 g/dL — ABNORMAL LOW (ref 13.0–17.0)
MCH: 30.7 pg (ref 26.0–34.0)
MCHC: 33.3 g/dL (ref 30.0–36.0)
MCV: 92.2 fL (ref 80.0–100.0)
Platelets: 142 10*3/uL — ABNORMAL LOW (ref 150–400)
RBC: 3.84 MIL/uL — ABNORMAL LOW (ref 4.22–5.81)
RDW: 12.6 % (ref 11.5–15.5)
WBC: 6 10*3/uL (ref 4.0–10.5)
nRBC: 0 % (ref 0.0–0.2)

## 2019-07-26 LAB — PROCALCITONIN: Procalcitonin: 0.63 ng/mL

## 2019-07-26 LAB — C-REACTIVE PROTEIN: CRP: 7.6 mg/dL — ABNORMAL HIGH (ref <1.0)

## 2019-07-26 MED ORDER — SERTRALINE HCL 50 MG PO TABS
25.0000 mg | ORAL_TABLET | Freq: Every day | ORAL | Status: DC
  Administered 2019-07-27 – 2019-08-17 (×22): 25 mg via ORAL
  Filled 2019-07-26 (×22): qty 1

## 2019-07-26 MED ORDER — PANTOPRAZOLE SODIUM 40 MG PO TBEC: 40.0000 mg | DELAYED_RELEASE_TABLET | Freq: Every day | ORAL | Status: DC

## 2019-07-26 MED ORDER — ACETAMINOPHEN 160 MG/5ML PO SOLN: 650.0000 mg | ORAL | Status: DC | PRN

## 2019-07-26 MED ORDER — ADULT MULTIVITAMIN W/MINERALS CH
1.0000 | ORAL_TABLET | Freq: Every day | ORAL | Status: DC
  Administered 2019-07-27 – 2019-08-17 (×22): 1 via ORAL
  Filled 2019-07-26 (×22): qty 1

## 2019-07-26 MED ORDER — PANTOPRAZOLE SODIUM 40 MG PO TBEC
40.0000 mg | DELAYED_RELEASE_TABLET | Freq: Every day | ORAL | Status: DC
  Administered 2019-07-26 – 2019-08-16 (×22): 40 mg via ORAL
  Filled 2019-07-26 (×22): qty 1

## 2019-07-26 MED ORDER — SENNOSIDES-DOCUSATE SODIUM 8.6-50 MG PO TABS: 1.0000 | ORAL_TABLET | Freq: Every day | ORAL | Status: DC

## 2019-07-26 MED ORDER — SENNOSIDES-DOCUSATE SODIUM 8.6-50 MG PO TABS
1.0000 | ORAL_TABLET | Freq: Two times a day (BID) | ORAL | Status: DC
  Administered 2019-07-26 – 2019-07-29 (×6): 1 via ORAL
  Filled 2019-07-26 (×6): qty 1

## 2019-07-26 MED ORDER — LEVETIRACETAM 500 MG PO TABS
1500.0000 mg | ORAL_TABLET | Freq: Two times a day (BID) | ORAL | Status: DC
  Administered 2019-07-26 – 2019-08-17 (×44): 1500 mg via ORAL
  Filled 2019-07-26 (×30): qty 3
  Filled 2019-07-26: qty 6
  Filled 2019-07-26: qty 3
  Filled 2019-07-26: qty 6
  Filled 2019-07-26 (×11): qty 3

## 2019-07-26 MED ORDER — ACETAMINOPHEN 325 MG PO TABS
650.0000 mg | ORAL_TABLET | ORAL | Status: DC | PRN
  Administered 2019-08-10 – 2019-08-16 (×4): 650 mg via ORAL
  Filled 2019-07-26 (×4): qty 2

## 2019-07-26 MED ORDER — ZIPRASIDONE HCL 40 MG PO CAPS
40.0000 mg | ORAL_CAPSULE | Freq: Every day | ORAL | Status: DC
  Administered 2019-07-26 – 2019-08-16 (×22): 40 mg via ORAL
  Filled 2019-07-26 (×22): qty 1

## 2019-07-26 MED ORDER — DIVALPROEX SODIUM 500 MG PO DR TAB
500.0000 mg | DELAYED_RELEASE_TABLET | Freq: Two times a day (BID) | ORAL | Status: DC
  Administered 2019-07-26 – 2019-08-17 (×44): 500 mg via ORAL
  Filled 2019-07-26 (×46): qty 1

## 2019-07-26 MED ORDER — LACOSAMIDE 50 MG PO TABS
200.0000 mg | ORAL_TABLET | ORAL | Status: DC
  Administered 2019-07-26 – 2019-08-17 (×44): 200 mg via ORAL
  Filled 2019-07-26 (×43): qty 4

## 2019-07-26 MED ORDER — SORBITOL 70 % SOLN
30.0000 mL | Freq: Every day | Status: DC | PRN
  Administered 2019-07-27 – 2019-08-12 (×2): 30 mL via ORAL
  Filled 2019-07-26 (×2): qty 30

## 2019-07-26 MED ORDER — RESOURCE THICKENUP CLEAR PO POWD
ORAL | Status: DC | PRN
  Filled 2019-07-26: qty 125

## 2019-07-26 MED ORDER — ACETAMINOPHEN 650 MG RE SUPP: 650.0000 mg | RECTAL | Status: DC | PRN

## 2019-07-26 NOTE — Progress Notes (Signed)
PMR Admission Coordinator Pre-Admission Assessment   Patient: [name] Cruz is an 64 y.o., male MRN: [id number] DOB: [birth year] Height:   Weight: 84.5 kg                                                                                                                                                  Insurance Information HMO: yes    PPO:      PCP:      IPA:      80/20:      OTHER:  PRIMARY: U[employer] Medicare      Policy#: [id number]      Subscriber: pt CM Name: Wilburn Cornelia      Phone#: [phone]     Fax#: [phone]     Updated clinicals due 05/18/6977 Pre-Cert#: [id number]      Employer:  Benefits:  Phone #: [phone]     Name:  Irene Shipper. Date: 01/15/19     Deduct: $0      Out of Pocket Max: $4500 (met $590)      Life Max: n/a  CIR: $325/day for days 1-5      SNF: 20 full days Outpatient:      Co-Pay: $35/visit Home Health: 100%      Co-Pay:  DME: 80%     1        Co-Pay: 20% Providers:  SECONDARY:       Policy#:       Phone#:    Development worker, community:       Phone#:    The "Data Collection Information Summary" for patients in Inpatient Rehabilitation Facilities with attached "Privacy Act [place] Records" was provided and verbally reviewed with: Patient   Emergency Contact Information         Contact Information     Name Relation Home Work Mobile    [place] Sister [phone]    Letta Moynahan [phone]   [phone]       Current Medical History  Patient Admitting Diagnosis: [place]  History of Present Illness: [name] Cruz is a 64 y.o.right handed male hyperlipidemia, seizure disorder, bipolar disorder maintained on Geodon, CAD with CABG 2019, hypertension with history of deep vein thrombosis previously on Eliquis discontinued due to hemorrhagic CVA 2007.  Presented 07/19/2019 with left-sided weakness and slurred speech.  Noted systolic blood pressure 929W.  Cranial CT scan showed a 1.4 x 2.0 x 2.6 cm acute parenchymal hemorrhage centered  within the right thalamocapsular junction.  Redemonstrated a chronic left temporoparietal infarction.  MRI pending.  Alcohol level negative, Sodium 132, urine drug screen negative.  Cleviprex added for blood pressure control.  Patient remains on Vimpat as well as Depakote as prior to admission.  Dysphagia #3 nectar thick liquid diet.  Therapy evaluation completed with recommendations of physical medicine rehab consult.    Complete NIHSS TOTAL: 17  Past Medical History      Past Medical History:  Diagnosis Date  . Acute deep vein thrombosis (DVT) of left upper extremity (HCC)      L brachial and basilic veins, eliquis started 08/22/2017 to continue for 3 months total   . Cognitive impairment 2007    after stroke, saw rehab but told to stop because was too upsetting to him  . History of chicken pox    . HLD (hyperlipidemia)    . HTN (hypertension)    . NSTEMI (non-ST elevated myocardial infarction) ([place]) 02/21/2017  . Obesity    . Stroke, hemorrhagic ([place]) 2007    thought 2/2 HTN (240sbp); residual cognitive impairment, loss of R peripheral field, no driving      Family History  family history includes Alcohol abuse in his father; Alzheimer's disease in his maternal grandfather; Cancer in his mother.   Prior Rehab/Hospitalizations:  Has the patient had prior rehab or hospitalizations prior to admission? Yes   Has the patient had major surgery during 100 days prior to admission? No   Current Medications    Current Facility-Administered Medications:  .   stroke: mapping our early stages of recovery book, , Does not apply, Once, Aroor, Karena Addison R, MD .  acetaminophen (TYLENOL) tablet 650 mg, 650 mg, Oral, Q4H PRN, 650 mg at 07/24/19 1836 **OR** acetaminophen (TYLENOL) 160 MG/5ML solution 650 mg, 650 mg, Per Tube, Q4H PRN **OR** acetaminophen (TYLENOL) suppository 650 mg, 650 mg, Rectal, Q4H PRN, Aroor, Lanice Schwab, MD .  Chlorhexidine Gluconate Cloth 2 % PADS 6 each, 6 each, Topical, Daily,  Aroor, Lanice Schwab, MD, 6 each at 07/26/19 1056 .  divalproex (DEPAKOTE) DR tablet 500 mg, 500 mg, Oral, BID, Biby, Sharon L, NP, 500 mg at 07/26/19 1055 .  lacosamide (VIMPAT) tablet 200 mg, 200 mg, Oral, 2 times per day, Donzetta Starch, NP, 200 mg at 07/26/19 0852 .  levETIRAcetam (KEPPRA) tablet 1,500 mg, 1,500 mg, Oral, BID, Biby, Sharon L, NP, 1,500 mg at 07/26/19 1055 .  MEDLINE mouth rinse, 15 mL, Mouth Rinse, BID, Aroor, Karena Addison R, MD, 15 mL at 07/26/19 1056 .  multivitamin with minerals tablet 1 tablet, 1 tablet, Oral, Daily, Biby, Sharon L, NP, 1 tablet at 07/26/19 1055 .  pantoprazole (PROTONIX) EC tablet 40 mg, 40 mg, Oral, QHS, Garvin Fila, MD, 40 mg at 07/25/19 2236 .  Resource [employer], , Oral, PRN, Garvin Fila, MD .  senna-docusate (Senokot-S) tablet 1 tablet, 1 tablet, Oral, BID, Aroor, Lanice Schwab, MD, 1 tablet at 07/26/19 1055 .  sertraline (ZOLOFT) tablet 25 mg, 25 mg, Oral, Daily, Biby, Sharon L, NP, 25 mg at 07/26/19 1055 .  ziprasidone (GEODON) capsule 40 mg, 40 mg, Oral, QHS, Biby, Sharon L, NP, 40 mg at 07/25/19 2236   Patients Current Diet:     Diet Order                      DIET DYS 3 Room service appropriate? Yes with Assist; Fluid consistency: Nectar Thick  Diet effective now                      Precautions / Restrictions Precautions Precautions: Fall Precaution Comments: left sided weakness and inattention Restrictions Weight Bearing Restrictions: No    Has the patient had 2 or more falls or a fall with injury in the past year?Yes   Prior Activity Level  Independent, able to mow the  yard, not driving, no DME used   Prior Functional Level Prior Function Level of Independence: Independent Comments: enjoys mowing grass and doing yardwork; sister helps wiht Best boy; sister pays bills; does not dirve   Self Care: Did the patient need help bathing, dressing, using the toilet or eating?  Independent   Indoor Mobility:  Did the patient need assistance with walking from room to room (with or without device)? Independent   Stairs: Did the patient need assistance with internal or external stairs (with or without device)? Independent   Functional Cognition: Did the patient need help planning regular tasks such as shopping or remembering to take medications? Needed some help   Home Assistive Devices / Equipment Home Equipment: Walker - 2 wheels, Van - single point, Bedside commode, Shower seat, Grab bars - tub/shower   Prior Device Use: Indicate devices/aids used by the patient prior to current illness, exacerbation or injury? None of the above   Current Functional Level Cognition   Arousal/Alertness: Awake/alert Overall Cognitive Status: Impaired/Different from baseline Current Attention Level: Sustained Orientation Level: Oriented to person, Oriented to place, Oriented to situation Following Commands: Follows one step commands inconsistently, Follows one step commands with increased time Safety/Judgement: Decreased awareness of safety, Decreased awareness of deficits General Comments: Was able to talk about his tomatoes, however requires increased time to answer further questions however often states "I understand" Pt with difficulty motor planning bed level exercises, even on unaffected side Awareness: Impaired Awareness Impairment: Intellectual impairment Problem Solving: Impaired Problem Solving Impairment: Functional basic, Verbal basic    Extremity Assessment (includes Sensation/Coordination)   Upper Extremity Assessment: Defer to OT evaluation LUE Deficits / Details: some attempts to use spontaneously; observed active shoulder elevation and elbow extension; no functional grasp LUE Sensation: decreased light touch, decreased proprioception LUE Coordination: decreased fine motor, decreased gross motor  Lower Extremity Assessment: LLE deficits/detail LLE Deficits / Details: increased tone with  limited flexion, unable to isolate joint movement, tight heel cord with foot drop LLE Sensation: decreased proprioception LLE Coordination: decreased gross motor     ADLs   Overall ADL's : Needs assistance/impaired Eating/Feeding: Supervision/ safety, Set up Eating/Feeding Details (indicate cue type and reason): asked tnsg to check for pocketing Grooming: Moderate assistance, Sitting, Wash/dry hands, Wash/dry face Grooming Details (indicate cue type and reason): Noted discharge from L eye Upper Body Bathing: Moderate assistance, Sitting Lower Body Bathing: Maximal assistance, Sit to/from stand Upper Body Dressing : Moderate assistance, Sitting Lower Body Dressing: Maximal assistance, Sit to/from stand Toilet Transfer: +2 for physical assistance, Moderate assistance Toilet Transfer Details (indicate cue type and reason): simulated; poor awareness Toileting- Clothing Manipulation and Hygiene: Maximal assistance Toileting - Clothing Manipulation Details (indicate cue type and reason): foley Functional mobility during ADLs: Moderate assistance, +2 for physical assistance, Cueing for safety, Cueing for sequencing General ADL Comments: Session focus on neuromuscular re-education and bed level exercises to aid in mobility and strength     Mobility   Overal bed mobility: Needs Assistance Bed Mobility: Supine to Sit Supine to sit: Max assist, HOB elevated Sit to supine: Max assist General bed mobility comments: Max A to come to EOB, able to complete trunk engagement and neuromuscular re-education at EOB with with mod A, attemtped to clear hips to stand, pt unable     Transfers   Overall transfer level: Needs assistance Equipment used: 1 person hand held assist, 2 person hand held assist Transfers: Sit to/from Stand Sit to Stand: +2 physical assistance, Mod assist Stand  pivot transfers: Mod assist, +2 physical assistance General transfer comment: Attempted to clear bottom x2 sitting EOB,  unable to clear even with knee blocked     Ambulation / Gait / Stairs / Wheelchair Mobility         Posture / Balance Dynamic Sitting Balance Sitting balance - Comments: Required multimodal cues in order to maintain balance EOB, however will correct, pt noted with trace movement in LUE when completing various truncal engagement exercises Balance Overall balance assessment: Needs assistance Sitting-balance support: Feet supported, Bilateral upper extremity supported Sitting balance-Leahy Scale: Poor Sitting balance - Comments: Required multimodal cues in order to maintain balance EOB, however will correct, pt noted with trace movement in LUE when completing various truncal engagement exercises Postural control: Right lateral lean, Left lateral lean, Posterior lean Standing balance support: Single extremity supported Standing balance-Leahy Scale: Poor Standing balance comment: mod assist with R hand supported on bed rail to stand. Left leg blocked and both lower legs supported on the bed.      Special needs/care consideration Designated visitor [place] (from acute therapy documentation) Living Arrangements: Other relatives (sister)  Lives With: Family Available Help at Discharge: Family, Available 24 hours/day Type of Home: Mobile home Home Layout: One level Home Access: Stairs to enter Entrance Stairs-Rails: Can reach both Entrance Stairs-Number of Steps: 3 Bathroom Shower/Tub: Chiropodist: Standard Bathroom Accessibility: Yes How Accessible: Accessible via walker   Discharge Living Setting Plans for Discharge Living Setting: Lives with (comment) (sister Butch Penny) Type of Home at Discharge: Mobile home Discharge Home Layout: One level Discharge Home Access: [place] entrance Discharge Bathroom Shower/Tub: [place] unit Discharge Bathroom Toilet: Standard Discharge Bathroom Accessibility: No   Social/Family/Support  Systems Anticipated Caregiver: sisters [name], Butch Penny, and Diane) Anticipated Caregiver's Contact Information: [name] [phone] Ability/Limitations of Caregiver: supervision to min assist Caregiver Availability: 24/7 Discharge Plan Discussed with Primary Caregiver: Yes Is Caregiver In Agreement with Plan?: Yes Does Caregiver/Family have Issues with Lodging/Transportation while Pt is in Rehab?: No     Goals Patient/Family Goal for Rehab: PT/OT/SLP supervision Expected length of stay: 12-18 days Pt/Family Agrees to Admission and willing to participate: Yes Program Orientation Provided & Reviewed with Pt/Caregiver Including Roles  & Responsibilities: Yes  Barriers to Discharge: Insurance for SNF coverage     Decrease burden of Care through IP rehab admission: n/a   Possible need for SNF placement upon discharge: Not anticipated     Patient Condition: This patient's medical and functional status has changed since the consult dated: 7/6 in which the Rehabilitation Physician determined and documented that the patient's condition is appropriate for intensive rehabilitative care in an inpatient rehabilitation facility. See "History of Present Illness" (above) for medical update. Functional changes are:no notable functional changes. Patient's medical and functional status update has been discussed with the Rehabilitation physician and patient remains appropriate for inpatient rehabilitation. Will admit to inpatient rehab today.   Preadmission Screen Completed By:  Genella Mech, CCC-SLP, DPT 07/26/2019 11:23 AM ______________________________________________________________________   Discussed status with Dr. Naaman Plummer on 07/26/2019 at 1000 and received approval for admission today.   Admission Coordinator:  Genella Mech, 11:37am on 07/26/19   Edits made by Clemens Catholic, [place], [place]

## 2019-07-26 NOTE — Progress Notes (Signed)
Physical Therapy Treatment Patient Details Name: [name] Cruz MRN: [id number] DOB: 01/20/1955 Today's Date: 07/26/2019    History of Present Illness 64 y.o. male with past medical history of deep vein thrombosis previously on Eliquis, history of hemorrhagic stroke secondary to hypertension with blood pressure of 240 systolic, hypertension, hyperlipidemia presents to the emergency department with sudden onset left-sided weakness. NIHSS 11. CT - Acute hemorrhage R thalamocapsular junction; mild associated edema     PT Comments    Patient alert on arrival. After EOB for several minutes he began to become more lethargic, closing his eyes frequently. ?incr left facial droop. Returned to supine and RN notified.   Follow Up Recommendations  CIR     Equipment Recommendations  Wheelchair (measurements PT);Wheelchair cushion (measurements PT);3in1 (PT);Hospital bed    Recommendations for Other Services Rehab consult     Precautions / Restrictions Precautions Precautions: Fall Precaution Comments: left sided weakness and inattention    Mobility  Bed Mobility Overal bed mobility: Needs Assistance Bed Mobility: Rolling;Sidelying to Sit;Sit to Sidelying Rolling: Max assist Sidelying to sit: Min assist;+2 for safety/equipment       General bed mobility comments: Agreed to sit at EOB to eat some lunch. Initially alert, but longer he stayed up ~10-15 minutes--he became more lethargic/unresponsive  Transfers                 General transfer comment: became too lethargic to attempt  Ambulation/Gait                 Stairs             Wheelchair Mobility    Modified Rankin (Stroke Patients Only) Modified Rankin (Stroke Patients Only) Pre-Morbid Rankin Score: No significant disability Modified Rankin: Severe disability     Balance Overall balance assessment: Needs assistance Sitting-balance support: Feet supported;Bilateral upper extremity supported Sitting  balance-Leahy Scale: Poor Sitting balance - Comments: Required multimodal cues in order to maintain balance EOB Postural control: Right lateral lean;Left lateral lean;Posterior lean Standing balance support: Single extremity supported Standing balance-Leahy Scale: Poor Standing balance comment: mod assist with R hand supported on bed rail to stand. Left leg blocked and both lower legs supported on the bed.                             Cognition Arousal/Alertness: Lethargic;Awake/alert (became less responsive towards end of session) Behavior During Therapy: [name] for tasks assessed/performed Overall Cognitive Status: Impaired/Different from baseline Area of Impairment: Attention;Memory;Following commands;Safety/judgement;Awareness;Problem solving;Orientation                 Orientation Level: Disoriented to;Time Current Attention Level: Sustained Memory: Decreased short-term memory;Decreased recall of precautions Following Commands: Follows one step commands inconsistently;Follows one step commands with increased time Safety/Judgement: Decreased awareness of safety;Decreased awareness of deficits Awareness: Intellectual Problem Solving: Slow processing;Difficulty sequencing;Requires verbal cues;Requires tactile cues;Decreased initiation        Exercises Low Level/ICU Exercises Ankle Circles/Pumps: Both;PROM;10 reps    General Comments        Pertinent Vitals/Pain Pain Assessment: Faces Faces Pain Scale: No hurt    Home Living                      Prior Function            PT Goals (current goals can now be found in the care plan section) Acute Rehab PT Goals Patient Stated Goal: to get better  PT Goal Formulation: With patient Time For Goal Achievement: 08/03/19 Potential to Achieve Goals: Good Progress towards PT goals: Progressing toward goals    Frequency    Min 4X/week      PT Plan Current plan remains appropriate     Co-evaluation PT/OT/SLP Co-Evaluation/Treatment: Yes Reason for Co-Treatment: Complexity of the patient's impairments (multi-system involvement);For patient/therapist safety PT goals addressed during session: Mobility/safety with mobility;Balance        AM-PAC PT "6 Clicks" Mobility   Outcome Measure  Help needed turning from your back to your side while in a flat bed without using bedrails?: A Lot Help needed moving from lying on your back to sitting on the side of a flat bed without using bedrails?: A Lot Help needed moving to and from a bed to a chair (including a wheelchair)?: Total Help needed standing up from a chair using your arms (e.g., wheelchair or bedside chair)?: Total Help needed to walk in hospital room?: Total Help needed climbing 3-5 steps with a railing? : Total 6 Click Score: 8    End of Session Equipment Utilized During Treatment: Gait belt Activity Tolerance: Patient limited by lethargy;Patient limited by fatigue Patient left: in bed;with call bell/phone within reach;with bed alarm set Nurse Communication: Mobility status PT Visit Diagnosis: Other abnormalities of gait and mobility (R26.89);Hemiplegia and hemiparesis;Apraxia (R48.2) Hemiplegia - Right/Left: Left Hemiplegia - dominant/non-dominant: Non-dominant Hemiplegia - caused by: Nontraumatic intracerebral hemorrhage     Time: 1214-1242 PT Time Calculation (min) (ACUTE ONLY): 28 min  Charges:  $Neuromuscular Re-education: 8-22 mins                      Jerolyn Center, PT Pager [phone]    Zena Amos 07/26/2019, 2:03 PM

## 2019-07-26 NOTE — Progress Notes (Signed)
Patient arrival to [facility] via stretcher from 3W.  Alert and oriented x3 with comprehensible speech and appropriate responses.  Mild forgetfulness noted.  MOE x3 with LUE flaccidity.  External catheter secure and intact to remain until evaluation by Provider and Therapist.  Skin clean, dry and intact.  Left hand PIV #18 gauge saline locked with site WNL.  Regular diet tolerated with set-up assistance to right side.  Patient denies any pain, discomfort or disturbance.  No further needs or concerns at this time. Aspiration, fall, and seizure precautions maintained. Nursing will continue to monitor.

## 2019-07-26 NOTE — Progress Notes (Signed)
Report given to [place], Charity fundraiser. Personal belongings packed by nursing staff and transported with pt. Pt. transported to room 9 via hospital bed.

## 2019-07-26 NOTE — Progress Notes (Signed)
Occupational Therapy Treatment Patient Details Name: [name] Cruz MRN: [id number] DOB: [birth year] Today's Date: 07/26/2019    History of present illness 64 y.o. male with past medical history of deep vein thrombosis previously on Eliquis, history of hemorrhagic stroke secondary to hypertension with blood pressure of 240 systolic, hypertension, hyperlipidemia presents to the emergency department with sudden onset left-sided weakness. NIHSS 11. CT - Acute hemorrhage R thalamocapsular junction; mild associated edema    OT comments  Pt seen in conjunction with PT to maximize participation and activity tolerance. Pt continues to present with L sided weakness, impaired balance, L inattention and decreased activity tolerance impacting pts ability to complete BADLs. Pt reports wanting to eat lunch. Pt agreeable to transition EOB with MIN- MOD A +2 to pts R side. Pt requried MIN - MOD A for sitting balance. Pt able to eat with set-up - supervision from EOB. Pt noted to become lethargic towards end of session keeping eyes closed and not responding to therapists, question increased facial drop on L side- alerted RN. Deffered further mobility d/t pts level of arousal. DC plan remains appropriate, will follow acutely per POC.   Follow Up Recommendations  CIR;Supervision/Assistance - 24 hour    Equipment Recommendations  None recommended by OT    Recommendations for Other Services      Precautions / Restrictions Precautions Precautions: Fall Precaution Comments: left sided weakness and inattention Restrictions Weight Bearing Restrictions: No       Mobility Bed Mobility Overal bed mobility: Needs Assistance Bed Mobility: Rolling;Sidelying to Sit;Sit to Sidelying Rolling: Max assist Sidelying to sit: Min assist;+2 for safety/equipment     Sit to sidelying: Mod assist;+2 for physical assistance General bed mobility comments: Agreed to sit at EOB to eat some lunch. Initially alert, but longer he  stayed up ~10-15 minutes--he became more lethargic/unresponsive. pt able to use RUE to push into bed to elevate trunk into sitting. no awareness towards LUE requiring cues to hold on to LUE during mobility  Transfers                 General transfer comment: became too lethargic to attempt    Balance Overall balance assessment: Needs assistance Sitting-balance support: Feet supported;Bilateral upper extremity supported Sitting balance-Leahy Scale: Poor Sitting balance - Comments: Required multimodal cues in order to maintain balance EOB; at least MIN A often MOD A for sitting balance Postural control: Right lateral lean;Left lateral lean;Posterior lean Standing balance support: Single extremity supported Standing balance-Leahy Scale: Poor Standing balance comment: mod assist with R hand supported on bed rail to stand. Left leg blocked and both lower legs supported on the bed.                            ADL either performed or assessed with clinical judgement   ADL Overall ADL's : Needs assistance/impaired Eating/Feeding: Supervision/ safety;Set up;Sitting;Minimal assistance Eating/Feeding Details (indicate cue type and reason): supervision- set- up to eat lunch EOB however pt did requried MINA  for sitting balance EOB.         Lower Body Bathing: Total assistance;Bed level Lower Body Bathing Details (indicate cue type and reason): pt required total A for LB bathing from supine d/t condom cath leaking in bed                     Functional mobility during ADLs: Moderate assistance;+2 for physical assistance;Cueing for safety;Cueing for sequencing (bed mobility only)  Vision       Perception     Praxis      Cognition Arousal/Alertness: Lethargic;Awake/alert (pt less responsive towards end of session noted to close eyes during session) Behavior During Therapy: [facility] for tasks assessed/performed Overall Cognitive Status: Impaired/Different from  baseline Area of Impairment: Attention;Memory;Following commands;Safety/judgement;Awareness;Problem solving;Orientation                 Orientation Level: Disoriented to;Time (able to state location, unable to problem solve correct month despite cue of 4th of july being this month) Current Attention Level: Sustained Memory: Decreased short-term memory;Decreased recall of precautions Following Commands: Follows one step commands inconsistently;Follows one step commands with increased time Safety/Judgement: Decreased awareness of safety;Decreased awareness of deficits Awareness: Intellectual Problem Solving: Slow processing;Difficulty sequencing;Requires verbal cues;Requires tactile cues;Decreased initiation General Comments: pt noted to state " I understand" often during session. pt following most commands but noted to become lethargic towards end of session.        Exercises   Shoulder Instructions       General Comments  noted sublux on LUE    Pertinent Vitals/ Pain       Pain Assessment: Faces Faces Pain Scale: No hurt  Home Living                                          Prior Functioning/Environment              Frequency  Min 2X/week        Progress Toward Goals  OT Goals(current goals can now be found in the care plan section)  Progress towards OT goals: Progressing toward goals  Acute Rehab OT Goals Patient Stated Goal: to get better OT Goal Formulation: With patient Time For Goal Achievement: 08/03/19 Potential to Achieve Goals: Good  Plan Discharge plan remains appropriate;Frequency remains appropriate    Co-evaluation      Reason for Co-Treatment: For patient/therapist safety;Complexity of the patient's impairments (multi-system involvement);To address functional/ADL transfers;Necessary to address cognition/behavior during functional activity PT goals addressed during session: Mobility/safety with mobility;Balance OT goals  addressed during session: ADL's and self-care      AM-PAC OT "6 Clicks" Daily Activity     Outcome Measure   Help from another person eating meals?: A Little Help from another person taking care of personal grooming?: A Lot Help from another person toileting, which includes using toliet, bedpan, or urinal?: A Lot Help from another person bathing (including washing, rinsing, drying)?: A Lot Help from another person to put on and taking off regular upper body clothing?: A Lot Help from another person to put on and taking off regular lower body clothing?: A Lot 6 Click Score: 13    End of Session    OT Visit Diagnosis: Unsteadiness on feet (R26.81);Other abnormalities of gait and mobility (R26.89);Muscle weakness (generalized) (M62.81);Other symptoms and signs involving cognitive function;Hemiplegia and hemiparesis Hemiplegia - Right/Left: Left Hemiplegia - caused by: Cerebral infarction   Activity Tolerance Patient tolerated treatment well   Patient Left in bed;with call bell/phone within reach;with bed alarm set   Nurse Communication Mobility status;Other (comment) (more lethargic towards end of session)        Time: 2248-2500 OT Time Calculation (min): 28 min  Charges: OT General Charges $OT Visit: 1 Visit OT Treatments $Self Care/Home Management : 8-22 mins  Pollyann Glen C., COTA/L Acute Rehabilitation Services [phone]  Angelina Pih 07/26/2019, 3:44 PM

## 2019-07-26 NOTE — TOC Transition Note (Signed)
Transition of Care [facility]) - CM/SW Discharge Note   Patient Details  Name: [name] Cruz MRN: [id number] Date of Birth: [birth year]  [facility] of [facility] CM/SW Contact:  Kermit Balo, RN Phone Number: 07/26/2019, 12:08 PM   Clinical Narrative:    Pt discharging to CIR today. CM signing off.    Final next level of care: IP Rehab Facility Barriers to Discharge: No Barriers Identified   Patient Goals and CMS Choice        Discharge Placement                       Discharge Plan and Services                                     Social Determinants of Health (SDOH) Interventions     Readmission Risk Interventions No flowsheet data found.

## 2019-07-26 NOTE — Progress Notes (Signed)
Inpatient Rehab Admissions Coordinator:    I spoke with patient at bedside to notify him that I have a CIR bed available for him. He is agreeable to transfer and signed all needed consent. Pt.'s sister was notified of Pt. Transfer.   Megan Salon, MS, CCC-SLP Rehab Admissions Coordinator  [phone] (celll) [phone] (office)

## 2019-07-26 NOTE — Progress Notes (Signed)
°  Speech Language Pathology Treatment: Dysphagia;Cognitive-Linquistic  Patient Details Name: [name] Cruz MRN: [id number] DOB: February 16, 1955 Today's Date: 07/26/2019 Time: 9379-0240 SLP Time Calculation (min) (ACUTE ONLY): 12 min  Assessment / Plan / Recommendation Clinical Impression  Pt was seen with textures from current diet with Mod faded to Min cues provided for slower pacing and taking brief pauses between each sip of nectar thick liquids. When observed drinking consecutive boluses, this consistently elicited immediate coughing. Note that he has had some elevate temperatures but his CXR was clear. Given all of the above, deferred additional trials of advanced textures today with emphasis placed more on carryover of swallowing strategies. SLP also provided Mod cues for intellectual and emergent awareness of acute deficits. Continue to recommend CIR.    HPI HPI: Pt is a 64 yo male presenting with sudden onset L sided weakness. CT showed R thalamic hemorrhage. PMH: DVT, hemorrhagic stroke with residual cognitive-linguistic impairment, HTN, HLD, seizures, NSTEMI      SLP Plan  Continue with current plan of care       Recommendations  Diet recommendations: Dysphagia 3 (mechanical soft);Nectar-thick liquid Liquids provided via: Cup;No straw Medication Administration: Whole meds with puree Supervision: Staff to assist with self feeding;Full supervision/cueing for compensatory strategies Compensations: Slow rate;Small sips/bites;Monitor for anterior loss Postural Changes and/or Swallow Maneuvers: Seated upright 90 degrees                Oral Care Recommendations: Oral care BID Follow up Recommendations: Inpatient Rehab SLP Visit Diagnosis: Dysphagia, oropharyngeal phase (R13.12) Plan: Continue with current plan of care       GO                Mahala Menghini., M.A. CCC-SLP Acute Rehabilitation Services Pager [phone] Office [phone]  07/26/2019, 11:48 AM

## 2019-07-26 NOTE — Discharge Summary (Signed)
Discharge Summary  [name] Cruz ZOX:[id number] DOB: Jun 17, 1955  PCP: [name], MD  Admit date: 07/19/2019 Discharge date: 07/26/2019  Time spent: 40 mins  Recommendations for Outpatient Follow-up:  1. Follow-up with neurology as an outpatient 2. Follow-up with PCP  Discharge Diagnoses:  Active Hospital Problems   Diagnosis Date Noted   ICH (intracerebral hemorrhage) (HCC) 07/19/2019    Resolved Hospital Problems  No resolved problems to display.    Discharge Condition: Stable  Diet recommendation: As tolerated  Vitals:   07/26/19 0426 07/26/19 0910  BP: 119/86 117/78  Pulse: 73 72  Resp: 18 18  Temp: 98.7 F (37.1 C) 98.4 F (36.9 C)  SpO2: 100% 96%    History of present illness:  [name] Vittorio Milleris a 64 y.o.malewith past medical history of deep vein thrombosis previously on Eliquis, history of hemorrhagic stroke secondary to hypertension with blood pressure of 240systolic, hypertension, hyperlipidemia presents to the emergency department with sudden onset left-sided weakness. Brought to the ED by EMS as code stroke. Stat CT obtained showed right thalamic hemorrhage. Blood pressure 145/118with right BP cuff and 158 x 85 and left BP cuff. Patient received labetalol and blood pressure remain high and so patient was started on Cleviprex drip. Neurology admitted patient to neuro ICU. [facility] hospitalist asked to assume care on 07/25/2019 for further management of fever that was noted on 07/23/2019 and further rehab needs.   Today, patient denies any new complaints.  Denies any headaches, chest pain, shortness of breath, abdominal pain, nausea/vomiting, fever/chills, cough.  Patient stable to transfer to CIR for further rehab needs   Hospital Course:  Active Problems:   ICH (intracerebral hemorrhage) (HCC)   Acute right basal ganglia ICH Concern for hemorrhagic conversion given history of A. fib on Eliquis MRI showed stable 2.6 cm right basal ganglia  hemorrhage CT head showed right thalamocapsular hemorrhage with mild edema Echo with normal EF, no cardiac source of embolism UDS negative LDL 78, A1c 5.6 PT/OT recommend CIR Continue to hold home aspirin for now pending neurology re-evaluation  SIRS Noted fever on 07/23/2019, none since then Currently afebrile, with no leukocytosis UA negative, chest x-ray unremarkable Procalcitonin, CRP elevated, trending down without any antibiotics BCX2 no growth to date Monitor closely  Paroxysmal A. Fib Rate controlled Not on any anticoagulation at home, plan to follow-up with Dr. Pearlean Brownie to consider anticoagulation for stroke prevention once ICH resolves  History of seizure Continue Keppra, Depakote, ziprasidone, lacosamide  Hypertension Stable, weaned off cleviprex Coreg on hold due to soft BP  Hyperlipidemia Hold Lipitor,  resume upon discharge  Obesity Lifestyle modification advised  Possible mild cognitive impairment/vascular dementia Monitor closely        Malnutrition Type:      Malnutrition Characteristics:      Nutrition Interventions:      Estimated body mass index is 33 kg/m as calculated from the following:   Height as of 07/14/19:  (1.6 m).   Weight as of this encounter: 84.5 kg.    Procedures:  None  Consultations:  Neurology  Discharge Exam: BP 117/78 (BP Location: Right Arm)    Pulse 72    Temp 98.4 F (36.9 C) (Oral)    Resp 18    Wt 84.5 kg    SpO2 96%    BMI 33.00 kg/m   General: NAD Cardiovascular: S1, S2 present Respiratory: CTA B Neurology: Strength normal on the right side, 0/5 left upper extremity, 3/5 left lower extremity  Discharge Instructions You  were cared for by a hospitalist during your hospital stay. If you have any questions about your discharge medications or the care you received while you were in the hospital after you are discharged, you can call the unit and asked to speak with the hospitalist on call if  the hospitalist that took care of you is not available. Once you are discharged, your primary care physician will handle any further medical issues. Please note that NO REFILLS for any discharge medications will be authorized once you are discharged, as it is imperative that you return to your primary care physician (or establish a relationship with a primary care physician if you do not have one) for your aftercare needs so that they can reassess your need for medications and monitor your lab values.  Discharge Instructions    Ambulatory referral to Neurology   Complete by: As directed    [facility] Too complicated for NP to follow. Thanks.   Diet - low sodium heart healthy   Complete by: As directed    Increase activity slowly   Complete by: As directed      Allergies as of 07/26/2019      Reactions   Losartan Other (See Comments)   hyperkalemia      Medication List    STOP taking these medications   aspirin EC 81 MG tablet   atorvastatin 80 MG tablet Commonly known as: LIPITOR   carvedilol 3.125 MG tablet Commonly known as: COREG     TAKE these medications   B-12 1000 MCG Subl Place 1 tablet under the tongue daily. What changed: how much to take   divalproex 500 MG DR tablet Commonly known as: Depakote Take 1 tablet (500 mg total) by mouth 2 (two) times daily.   levETIRAcetam 750 MG tablet Commonly known as: KEPPRA Take 2 tablets (1,500 mg total) by mouth 2 (two) times daily.   multivitamin with minerals Tabs tablet Take 1 tablet by mouth daily.   pantoprazole 40 MG tablet Commonly known as: PROTONIX Take 1 tablet (40 mg total) by mouth at bedtime.   senna-docusate 8.6-50 MG tablet Commonly known as: Senokot-S Take 1 tablet by mouth at bedtime.   sertraline 25 MG tablet Commonly known as: ZOLOFT TAKE 1 TABLET BY MOUTH EVERY DAY   Vimpat 100 MG Tabs Generic drug: Lacosamide Take 2 tablets (200 mg total) by mouth in the  morning and at bedtime. 200 mg 2 times a day.  Total 400 mg.   ziprasidone 40 MG capsule Commonly known as: GEODON TAKE 1 CAPSULE (40 MG TOTAL) BY MOUTH AT BEDTIME.      Allergies  Allergen Reactions   Losartan Other (See Comments)    hyperkalemia    Follow-up Information    Micki RileySethi, Pramod S, MD. Schedule an appointment as soon as possible for a visit in 4 week(s).   Specialties: Neurology, Radiology Contact information: [address] [place] [address] [phone]        [name], [name], MD. Schedule an appointment as soon as possible for a visit.   Specialty: Family Medicine Contact information: [address] [place] [phone]        Corky CraftsVaranasi, Jayadeep S, MD .   Specialties: Cardiology, Radiology, Interventional Cardiology Contact information: [address] [place] [address] [phone]                The results of significant  diagnostics from this hospitalization (including imaging, microbiology, ancillary and laboratory) are listed below for reference.    Significant Diagnostic Studies: DG Chest 2 View  Result Date: 07/23/2019 CLINICAL DATA:  Febrile, intracranial hemorrhage EXAM: CHEST - 2 VIEW COMPARISON:  03/25/2018 FINDINGS: Frontal and lateral views of the chest demonstrate an unremarkable cardiac silhouette. Postsurgical changes are seen from median sternotomy. No airspace disease, effusion, or pneumothorax. No acute bony abnormality. IMPRESSION: 1. No acute intrathoracic process. Electronically Signed   By: Sharlet Salina M.D.   On: 07/23/2019 20:32   MR ANGIO HEAD WO CONTRAST  Result Date: 07/20/2019 CLINICAL DATA:  Stroke, follow-up. EXAM: MRI HEAD WITHOUT AND WITH CONTRAST MRA HEAD WITHOUT CONTRAST TECHNIQUE: Multiplanar, multiecho pulse sequences of the brain and surrounding structures were obtained without and with intravenous contrast. Angiographic images of the head were  obtained using MRA technique without contrast. CONTRAST:  8mL GADAVIST GADOBUTROL 1 MMOL/ML IV SOLN COMPARISON:  Noncontrast head CT 07/19/2019, brain MRI 11/11/2018, CT angiogram head/neck 05/27/2019. FINDINGS: MRI HEAD FINDINGS Brain: Unchanged size of a 2.6 cm acute/early subacute parenchymal hemorrhage centered within the right thalamocapsular junction. Mild surrounding edema also appears unchanged. There is a 2 mm round focus of enhancement within the ventromedial aspect of the hemorrhage (series 12, image 34). Otherwise, no nodular or masslike enhancement is demonstrated in this region. There is no significant ventricular effacement. No midline shift. Redemonstrated chronic left temporoparietal cortical/subcortical infarct with a small amount of chronic hemosiderin deposition at this site. Ex vacuo dilatation of the left lateral ventricle. Stable moderate patchy T2/FLAIR hyperintensity within the cerebral white matter which is nonspecific, but consistent with chronic small vessel ischemic disease. Similar appearance of scattered supratentorial and infratentorial chronic microhemorrhages. Stable, mild generalized parenchymal atrophy. No extra-axial fluid collection. Vascular: Expected proximal arterial flow voids. Skull and upper cervical spine: No focal marrow lesion. Sinuses/Orbits: Visualized orbits show no acute finding. Mild ethmoid sinus mucosal thickening. Bilateral mastoid effusions (larger on the left). MRA HEAD FINDINGS The examination is moderately motion degraded which limits evaluation for stenoses and small aneurysms. Most notably, this limits evaluation for stenoses within the M2 and more distal MCA branch vessels bilaterally. This also limits evaluation for stenoses within the distal A2 and more distal anterior cerebral arteries. The intracranial internal carotid arteries are patent without high-grade stenosis. The M1 middle cerebral arteries are patent without high-grade stenosis. No acute  proximal M2 branch occlusion is identified. The intracranial vertebral arteries are patent without high-grade stenosis, as is the basilar artery. The posterior cerebral arteries are patent proximally without high-grade stenosis. Predominantly fetal origin of the right posterior cerebral artery. No intracranial aneurysm or vascular malformation is identified. IMPRESSION: MRI brain: 1. Unchanged size of a 2.6 cm acute/early subacute parenchymal hemorrhage centered within the right thalamocapsular junction. There is a 2 mm round focus of enhancement within the ventromedial aspect of the hemorrhage. This may reflect vascular enhancement. However, short-interval 4-6 week contrast-enhanced MRI follow-up is recommended to exclude an underlying enhancing lesion at this site. 2. Redemonstrated chronic left temporoparietal cortical/subcortical infarct. 3. Stable mild generalized parenchymal atrophy and moderate chronic small vessel ischemic disease. 4. Scattered supratentorial and infratentorial chronic microhemorrhages, which likely reflect sequela of chronic hypertensive microangiopathy. Superimposed cerebral amyloid angiopathy is difficult to exclude. 5. Mild ethmoid sinus mucosal thickening. 6. Left greater than right mastoid effusions. MRA head: 1. Moderately motion degraded examination, limiting evaluation. 2. No intracranial large vessel occlusion. 3. No acute medium-sized vessel occlusion is identified. 4. No intracranial aneurysm  or vascular malformation is identified. Electronically Signed   By: Jackey Loge DO   On: 07/20/2019 17:19   MR BRAIN W WO CONTRAST  Result Date: 07/20/2019 CLINICAL DATA:  Stroke, follow-up. EXAM: MRI HEAD WITHOUT AND WITH CONTRAST MRA HEAD WITHOUT CONTRAST TECHNIQUE: Multiplanar, multiecho pulse sequences of the brain and surrounding structures were obtained without and with intravenous contrast. Angiographic images of the head were obtained using MRA technique without contrast.  CONTRAST:  8mL GADAVIST GADOBUTROL 1 MMOL/ML IV SOLN COMPARISON:  Noncontrast head CT 07/19/2019, brain MRI 11/11/2018, CT angiogram head/neck 05/27/2019. FINDINGS: MRI HEAD FINDINGS Brain: Unchanged size of a 2.6 cm acute/early subacute parenchymal hemorrhage centered within the right thalamocapsular junction. Mild surrounding edema also appears unchanged. There is a 2 mm round focus of enhancement within the ventromedial aspect of the hemorrhage (series 12, image 34). Otherwise, no nodular or masslike enhancement is demonstrated in this region. There is no significant ventricular effacement. No midline shift. Redemonstrated chronic left temporoparietal cortical/subcortical infarct with a small amount of chronic hemosiderin deposition at this site. Ex vacuo dilatation of the left lateral ventricle. Stable moderate patchy T2/FLAIR hyperintensity within the cerebral white matter which is nonspecific, but consistent with chronic small vessel ischemic disease. Similar appearance of scattered supratentorial and infratentorial chronic microhemorrhages. Stable, mild generalized parenchymal atrophy. No extra-axial fluid collection. Vascular: Expected proximal arterial flow voids. Skull and upper cervical spine: No focal marrow lesion. Sinuses/Orbits: Visualized orbits show no acute finding. Mild ethmoid sinus mucosal thickening. Bilateral mastoid effusions (larger on the left). MRA HEAD FINDINGS The examination is moderately motion degraded which limits evaluation for stenoses and small aneurysms. Most notably, this limits evaluation for stenoses within the M2 and more distal MCA branch vessels bilaterally. This also limits evaluation for stenoses within the distal A2 and more distal anterior cerebral arteries. The intracranial internal carotid arteries are patent without high-grade stenosis. The M1 middle cerebral arteries are patent without high-grade stenosis. No acute proximal M2 branch occlusion is identified. The  intracranial vertebral arteries are patent without high-grade stenosis, as is the basilar artery. The posterior cerebral arteries are patent proximally without high-grade stenosis. Predominantly fetal origin of the right posterior cerebral artery. No intracranial aneurysm or vascular malformation is identified. IMPRESSION: MRI brain: 1. Unchanged size of a 2.6 cm acute/early subacute parenchymal hemorrhage centered within the right thalamocapsular junction. There is a 2 mm round focus of enhancement within the ventromedial aspect of the hemorrhage. This may reflect vascular enhancement. However, short-interval 4-6 week contrast-enhanced MRI follow-up is recommended to exclude an underlying enhancing lesion at this site. 2. Redemonstrated chronic left temporoparietal cortical/subcortical infarct. 3. Stable mild generalized parenchymal atrophy and moderate chronic small vessel ischemic disease. 4. Scattered supratentorial and infratentorial chronic microhemorrhages, which likely reflect sequela of chronic hypertensive microangiopathy. Superimposed cerebral amyloid angiopathy is difficult to exclude. 5. Mild ethmoid sinus mucosal thickening. 6. Left greater than right mastoid effusions. MRA head: 1. Moderately motion degraded examination, limiting evaluation. 2. No intracranial large vessel occlusion. 3. No acute medium-sized vessel occlusion is identified. 4. No intracranial aneurysm or vascular malformation is identified. Electronically Signed   By: Jackey Loge DO   On: 07/20/2019 17:19   DG Chest Port 1 View  Result Date: 07/24/2019 CLINICAL DATA:  Short of breath, hypertension EXAM: PORTABLE CHEST 1 VIEW COMPARISON:  07/23/2019 FINDINGS: Single frontal view of the chest demonstrates a stable cardiac silhouette. No airspace disease, effusion, or pneumothorax. Median sternotomy wires unchanged. IMPRESSION: 1. Stable exam, no acute process.  Electronically Signed   By: Sharlet Salina M.D.   On: 07/24/2019 18:15    DG Swallowing Func-Speech Pathology  Result Date: 07/21/2019 Objective Swallowing Evaluation: Type of Study: MBS-Modified Barium Swallow Study  Patient Details Name: THI SISEMORE MRN: [id number] Date of Birth: 06-Jun-1955 Today's Date: 07/21/2019 Time: SLP Start Time (ACUTE ONLY): 1310 -SLP Stop Time (ACUTE ONLY): 1334 SLP Time Calculation (min) (ACUTE ONLY): 24 min Past Medical History: Past Medical History: Diagnosis Date  Acute deep vein thrombosis (DVT) of left upper extremity (HCC)   L brachial and basilic veins, eliquis started 08/22/2017 to continue for 3 months total   Cognitive impairment 2007  after stroke, saw rehab but told to stop because was too upsetting to him  History of chicken pox   HLD (hyperlipidemia)   HTN (hypertension)   NSTEMI (non-ST elevated myocardial infarction) (HCC) 02/21/2017  Obesity   Stroke, hemorrhagic (HCC) 2007  thought 2/2 HTN (240sbp); residual cognitive impairment, loss of R peripheral field, no driving Past Surgical History: Past Surgical History: Procedure Laterality Date  ANKLE SURGERY  1990s  right foot with plate and screws  CORONARY ARTERY BYPASS GRAFT N/A 02/24/2017  3v Procedure: CORONARY ARTERY BYPASS GRAFTING (CABG) x 3 ON PUMP USING LEFT INTERNAL MAMMARY ARTERY TO LEFT ANTERIOR DESENDING CORNARY ARTERY, RIGHT GREATER SAPHENOUS VEIN TO LEFT CIRCUMFLEX ARTERY AND POSTERIOR DESENDING ARTERY. RIGHT GREATER SAPHENOUS VEIN OBTAINED VIA ENDOVEIN HARVEST.;  Surgeon: Delight Ovens, MD  IABP INSERTION N/A 02/21/2017  Procedure: IABP Insertion;  Surgeon: Yvonne Kendall, MD;  Location: [facility] IN[facility]IVE [facility];  Service: Cardiovascular;  Laterality: N/A;  LEFT HEART CATH AND CORONARY ANGIOGRAPHY N/A 02/21/2017  Procedure: LEFT HEART CATH AND CORONARY ANGIOGRAPHY;  Surgeon: Yvonne Kendall, MD;  Location: [facility] CV [facility];  Service: Cardiovascular;  Laterality: N/A;  TEE WITHOUT CARDIOVERSION N/A 02/24/2017  Procedure: TRANSESOPHAGEAL ECHOCARDIOGRAM (TEE);  Surgeon:  Delight Ovens, MD;  Location: [facility] OR;  Service: Open Heart Surgery;  Laterality: N/A; HPI: Pt is a 64 yo male presenting with sudden onset L sided weakness. CT showed R thalamic hemorrhage. PMH: DVT, hemorrhagic stroke with residual cognitive-linguistic impairment, HTN, HLD, seizures, NSTEMI  Subjective: alert, cooperative, limited historian Assessment / Plan / Recommendation CHL IP CLINICAL IMPRESSIONS 07/21/2019 Clinical Impression Pt presents with a moderate oropharyngeal dysphagia. He has limited and slow lingual movement, resulting in reduced lingual control for containment and posterior propulsion. Mastication is disorganized, but overall there is mild oral residue and minimal anterior spillage. Swallow triggers primarily at the pyriform sinuses with thin liquids spilling over into the airway silently and in small amounts before the swallow is initiated. He also has reduced pharyngeal squeeze, base of tongue retraction, and anterior hyolaryngeal movement that leaves mild residue in the pyriform sinuses. With residue already present int he pharynx, straw sips of nectar thick liquids are also silent aspirated before the swallow. A chin tuck does not improve his airway protection and he needs moderate amounts of cueing to implement it, making it an ineffective strategy. He had improved ariway protection with thin liquids when cued to take a small sip, hold it anteriorly in his mouth, and then swallow. Although it was still triggered at the pyriform sinuses, he had improving coordination and smaller boluses sizes that could be better contained. Recommend keeping him on Dys 3 diet and nectar thick liquids with SLP f/u to facilitate strategy use with therapeutic trials of thin liquids and potential exercises.  SLP Visit Diagnosis Dysphagia, oropharyngeal phase (R13.12) Attention and  concentration deficit following -- Frontal lobe and executive function deficit following -- Impact on safety and function Mild  aspiration risk;Moderate aspiration risk   CHL IP TREATMENT RECOMMENDATION 07/21/2019 Treatment Recommendations Therapy as outlined in treatment plan below   Prognosis 07/21/2019 Prognosis for Safe Diet Advancement Good Barriers to Reach Goals Cognitive deficits;Language deficits Barriers/Prognosis Comment -- CHL IP DIET RECOMMENDATION 07/21/2019 SLP Diet Recommendations Dysphagia 3 (Mech soft) solids;Nectar thick liquid Liquid Administration via Cup;No straw Medication Administration Whole meds with puree Compensations Slow rate;Small sips/bites;Monitor for anterior loss Postural Changes Seated upright at 90 degrees   CHL IP OTHER RECOMMENDATIONS 07/21/2019 Recommended Consults -- Oral Care Recommendations Oral care BID Other Recommendations Order thickener from pharmacy;Prohibited food (jello, ice cream, thin soups);Remove water pitcher   CHL IP FOLLOW UP RECOMMENDATIONS 07/21/2019 Follow up Recommendations Inpatient Rehab   CHL IP FREQUENCY AND DURATION 07/21/2019 Speech Therapy Frequency (ACUTE ONLY) min 2x/week Treatment Duration 2 weeks      CHL IP ORAL PHASE 07/21/2019 Oral Phase Impaired Oral - Pudding Teaspoon -- Oral - Pudding Cup -- Oral - Honey Teaspoon -- Oral - Honey Cup -- Oral - Nectar Teaspoon -- Oral - Nectar Cup Weak lingual manipulation;Reduced posterior propulsion;Lingual/palatal residue;Decreased bolus cohesion;Delayed oral transit Oral - Nectar Straw Weak lingual manipulation;Reduced posterior propulsion;Lingual/palatal residue;Decreased bolus cohesion;Delayed oral transit Oral - Thin Teaspoon -- Oral - Thin Cup Weak lingual manipulation;Reduced posterior propulsion;Lingual/palatal residue;Decreased bolus cohesion;Delayed oral transit Oral - Thin Straw Weak lingual manipulation;Reduced posterior propulsion;Lingual/palatal residue;Decreased bolus cohesion;Delayed oral transit Oral - Puree Weak lingual manipulation;Reduced posterior propulsion;Lingual/palatal residue;Decreased bolus cohesion;Delayed oral  transit Oral - Mech Soft Weak lingual manipulation;Reduced posterior propulsion;Lingual/palatal residue;Decreased bolus cohesion;Delayed oral transit;Impaired mastication Oral - Regular -- Oral - Multi-Consistency -- Oral - Pill -- Oral Phase - Comment --  CHL IP PHARYNGEAL PHASE 07/21/2019 Pharyngeal Phase Impaired Pharyngeal- Pudding Teaspoon -- Pharyngeal -- Pharyngeal- Pudding Cup -- Pharyngeal -- Pharyngeal- Honey Teaspoon -- Pharyngeal -- Pharyngeal- Honey Cup -- Pharyngeal -- Pharyngeal- Nectar Teaspoon -- Pharyngeal -- Pharyngeal- Nectar Cup Delayed swallow initiation-pyriform sinuses;Reduced pharyngeal peristalsis;Reduced anterior laryngeal mobility;Reduced airway/laryngeal closure;Reduced tongue base retraction;Pharyngeal residue - pyriform Pharyngeal -- Pharyngeal- Nectar Straw Delayed swallow initiation-pyriform sinuses;Reduced pharyngeal peristalsis;Reduced anterior laryngeal mobility;Reduced airway/laryngeal closure;Reduced tongue base retraction;Pharyngeal residue - pyriform;Penetration/Aspiration before swallow Pharyngeal Material enters airway, passes BELOW cords without attempt by patient to eject out (silent aspiration) Pharyngeal- Thin Teaspoon -- Pharyngeal -- Pharyngeal- Thin Cup Delayed swallow initiation-pyriform sinuses;Reduced pharyngeal peristalsis;Reduced anterior laryngeal mobility;Reduced airway/laryngeal closure;Reduced tongue base retraction;Pharyngeal residue - pyriform;Penetration/Aspiration before swallow Pharyngeal Material enters airway, passes BELOW cords without attempt by patient to eject out (silent aspiration) Pharyngeal- Thin Straw Delayed swallow initiation-pyriform sinuses;Reduced pharyngeal peristalsis;Reduced anterior laryngeal mobility;Reduced airway/laryngeal closure;Reduced tongue base retraction;Pharyngeal residue - pyriform;Penetration/Aspiration before swallow Pharyngeal Material enters airway, passes BELOW cords without attempt by patient to eject out (silent  aspiration) Pharyngeal- Puree Delayed swallow initiation-pyriform sinuses;Reduced pharyngeal peristalsis;Reduced anterior laryngeal mobility;Reduced airway/laryngeal closure;Reduced tongue base retraction;Pharyngeal residue - pyriform Pharyngeal -- Pharyngeal- Mechanical Soft Delayed swallow initiation-pyriform sinuses;Reduced pharyngeal peristalsis;Reduced anterior laryngeal mobility;Reduced airway/laryngeal closure;Reduced tongue base retraction;Pharyngeal residue - pyriform Pharyngeal -- Pharyngeal- Regular -- Pharyngeal -- Pharyngeal- Multi-consistency -- Pharyngeal -- Pharyngeal- Pill -- Pharyngeal -- Pharyngeal Comment --  [facility] IP CERVICAL ESOPHAGEAL PHASE 07/21/2019 Cervical Esophageal Phase WFL Pudding Teaspoon -- Pudding Cup -- Honey Teaspoon -- Honey Cup -- Nectar Teaspoon -- Nectar Cup -- Nectar Straw -- Thin Teaspoon -- Thin Cup -- Thin Straw -- Puree -- Mechanical Soft -- Regular -- Multi-consistency -- Pill --  Cervical Esophageal Comment -- Mahala Menghini., M.A. CCC-SLP [facility] Pager [phone] Office [phone] 07/21/2019, 2:17 PM              ECHOCARDIOGRAM COMPLETE  Result Date: 07/21/2019    ECHOCARDIOGRAM REPORT   Patient Name:   [name] Cruz Date of Exam: 07/21/2019 Medical Rec #:  [id number]      Height:       63.0 in Accession #:    [id number]     Weight:       186.3 lb Date of Birth:  05/16/1955      BSA:          1.876 m Patient Age:    64 years       BP:           80/62 mmHg Patient Gender: M              HR:           62 bpm. Exam Location:  Inpatient Procedure: 2D Echo, Cardiac Doppler and Color Doppler Indications:    Stroke 434.91 / I163.9  History:        Patient has prior history of Echocardiogram examinations, most                 recent 07/09/2018. CAD, Stroke, Arrythmias:Cardiac Arrest, Atrial                 Fibrillation and non-specific ST changes; Risk                 Factors:Hypertension, Dyslipidemia and Non-Smoker. PAD.  Sonographer:    Renella Cunas RDCS  Referring Phys: [id number] SHARON L BIBY  Sonographer Comments: Image acquisition challenging due to respiratory motion. IMPRESSIONS  1. Left ventricular ejection fraction, by estimation, is 55 to 60%. The left ventricle has normal function. The left ventricle has no regional wall motion abnormalities. Left ventricular diastolic parameters were normal.  2. Right ventricular systolic function is normal. The right ventricular size is normal. There is normal pulmonary artery systolic pressure.  3. The mitral valve is normal in structure. Trivial mitral valve regurgitation. No evidence of mitral stenosis.  4. The aortic valve is normal in structure. Aortic valve regurgitation is not visualized. No aortic stenosis is present.  5. The inferior vena cava is normal in size with greater than 50% respiratory variability, suggesting right atrial pressure of 3 mmHg. FINDINGS  Left Ventricle: Left ventricular ejection fraction, by estimation, is 55 to 60%. The left ventricle has normal function. The left ventricle has no regional wall motion abnormalities. The left ventricular internal cavity size was normal in size. There is  no left ventricular hypertrophy. Left ventricular diastolic parameters were normal. Right Ventricle: The right ventricular size is normal. No increase in right ventricular wall thickness. Right ventricular systolic function is normal. There is normal pulmonary artery systolic pressure. The tricuspid regurgitant velocity is 1.73 m/s, and  with an assumed right atrial pressure of 3 mmHg, the estimated right ventricular systolic pressure is 15.0 mmHg. Left Atrium: Left atrial size was normal in size. Right Atrium: Right atrial size was normal in size. Pericardium: There is no evidence of pericardial effusion. Mitral Valve: The mitral valve is normal in structure. There is mild thickening of the mitral valve leaflet(s). There is mild calcification of the mitral valve leaflet(s). Normal mobility of the mitral valve  leaflets. Trivial mitral valve regurgitation. No evidence of mitral valve stenosis. Tricuspid Valve: The tricuspid valve is normal in  structure. Tricuspid valve regurgitation is trivial. No evidence of tricuspid stenosis. Aortic Valve: The aortic valve is normal in structure. Aortic valve regurgitation is not visualized. No aortic stenosis is present. Pulmonic Valve: The pulmonic valve was normal in structure. Pulmonic valve regurgitation is not visualized. No evidence of pulmonic stenosis. Aorta: The aortic root is normal in size and structure. Venous: The inferior vena cava is normal in size with greater than 50% respiratory variability, suggesting right atrial pressure of 3 mmHg. IAS/Shunts: No atrial level shunt detected by color flow Doppler.  LEFT VENTRICLE PLAX 2D LVIDd:         4.80 cm     Diastology LVIDs:         3.40 cm     LV e' lateral:   11.50 cm/s LV PW:         0.90 cm     LV E/e' lateral: 4.7 LV IVS:        0.90 cm     LV e' medial:    8.27 cm/s LVOT diam:     2.20 cm     LV E/e' medial:  6.5 LV SV:         62 LV SV Index:   33 LVOT Area:     3.80 cm  LV Volumes (MOD) LV vol d, MOD A2C: 96.0 ml LV vol d, MOD A4C: 90.7 ml LV vol s, MOD A2C: 48.1 ml LV vol s, MOD A4C: 38.3 ml LV SV MOD A2C:     47.9 ml LV SV MOD A4C:     90.7 ml LV SV MOD BP:      50.6 ml RIGHT VENTRICLE RV S prime:     10.10 cm/s TAPSE (M-mode): 1.2 cm LEFT ATRIUM             Index       RIGHT ATRIUM           Index LA diam:        3.60 cm 1.92 cm/m  RA Area:     11.00 cm LA Vol (A2C):   34.2 ml 18.23 ml/m RA Volume:   22.40 ml  11.94 ml/m LA Vol (A4C):   27.0 ml 14.39 ml/m LA Biplane Vol: 31.4 ml 16.74 ml/m  AORTIC VALVE LVOT Vmax:   79.70 cm/s LVOT Vmean:  55.600 cm/s LVOT VTI:    0.162 m  AORTA Ao Root diam: 3.50 cm MITRAL VALVE               TRICUSPID VALVE MV Area (PHT): 4.49 cm    TR Peak grad:   12.0 mmHg MV Decel Time: 169 msec    TR Vmax:        173.00 cm/s MV E velocity: 53.60 cm/s MV A velocity: 31.20 cm/s  SHUNTS  MV E/A ratio:  1.72        Systemic VTI:  0.16 m                            Systemic Diam: 2.20 cm Charlton Haws MD Electronically signed by Charlton Haws MD Signature Date/Time: 07/21/2019/11:23:56 AM    Final    CT HEAD CODE STROKE WO CONTRAST  Addendum Date: 07/19/2019   ADDENDUM REPORT: 07/19/2019 19:34 ADDENDUM: These results were communicated to Dr. Laurence Slate At 7:33 pmon 7/5/2021by text page via the [facility] messaging system. Electronically Signed   By: Jackey Loge DO   On: 07/19/2019 19:34  Result Date: 07/19/2019 CLINICAL DATA:  Code stroke. Facial droop, altered mental status. Additional history provided: Last known normal 1645. EXAM: CT HEAD WITHOUT CONTRAST TECHNIQUE: Contiguous axial images were obtained from the base of the skull through the vertex without intravenous contrast. COMPARISON:  Brain MRI 05/27/2019, noncontrast head CT 05/27/2019, CT angiogram head/neck 05/27/2019. FINDINGS: Brain: Acute parenchymal hemorrhage centered within the right thalamocapsular junction measuring 1.4 x 2.0 x 2.6 cm (series 3, image 16) (series 5, image 36). Mild surrounding edema. No evidence of intraventricular extension at this time. Redemonstrated chronic infarct affecting the left temporoparietal lobes with associated chronic encephalomalacia and dystrophic calcification. Ex vacuo dilatation of the left lateral ventricle. Stable background mild generalized parenchymal atrophy and moderate chronic small vessel ischemic disease. No extra-axial fluid collection. No midline shift. Vascular: No hyperdense vessel.  Atherosclerotic calcifications. Skull: Normal. Negative for fracture or focal lesion. Sinuses/Orbits: Visualized orbits show no acute finding. Mild ethmoid and sphenoid sinus mucosal thickening. No significant mastoid effusion. IMPRESSION: 1. 1.4 x 2.0 x 2.6 cm acute parenchymal hemorrhage centered within the right thalamocapsular junction, a characteristic location for hypertensive hemorrhage. Mild associated  edema. No intraventricular extension of hemorrhage at this time. 2. Redemonstrated chronic left temporoparietal infarct. 3. Stable background mild generalized parenchymal atrophy and moderate chronic small vessel ischemic disease. 4. Mild paranasal sinus mucosal thickening. Electronically Signed: By: Jackey Loge DO On: 07/19/2019 19:20   VAS US CAROTID  Result Date: 07/22/2019 Carotid Arterial Duplex Study Indications:       CVA. Risk Factors:      Hypertension, hyperlipidemia. Comparison Study:  No prior studies. Performing Technologist: Chanda Busing RVT  Examination Guidelines: A complete evaluation includes B-mode imaging, spectral Doppler, color Doppler, and power Doppler as needed of all accessible portions of each vessel. Bilateral testing is considered an integral part of a complete examination. Limited examinations for reoccurring indications may be performed as noted.  Right Carotid Findings: +----------+--------+--------+--------+-----------------------+--------+             PSV cm/s EDV cm/s Stenosis Plaque Description      Comments  +----------+--------+--------+--------+-----------------------+--------+  CCA Prox   72       21                smooth and heterogenous tortuous  +----------+--------+--------+--------+-----------------------+--------+  CCA Distal 66       18                                                  +----------+--------+--------+--------+-----------------------+--------+  ICA Prox   110      34                calcific                          +----------+--------+--------+--------+-----------------------+--------+  ICA Distal 83       33                                        tortuous  +----------+--------+--------+--------+-----------------------+--------+  ECA        50       8                                                   +----------+--------+--------+--------+-----------------------+--------+ +----------+--------+-------+--------+-------------------+  PSV  cm/s EDV cms Describe Arm Pressure (mmHG)  +----------+--------+-------+--------+-------------------+  Subclavian 119                                            +----------+--------+-------+--------+-------------------+ +---------+--------+--+--------+--+---------+  Vertebral PSV cm/s 39 EDV cm/s 11 Antegrade  +---------+--------+--+--------+--+---------+  Left Carotid Findings: +----------+--------+--------+--------+-----------------------+--------+             PSV cm/s EDV cm/s Stenosis Plaque Description      Comments  +----------+--------+--------+--------+-----------------------+--------+  CCA Prox   86       21                smooth and heterogenous           +----------+--------+--------+--------+-----------------------+--------+  CCA Distal 67       18                calcific                          +----------+--------+--------+--------+-----------------------+--------+  ICA Prox   92       27                calcific                tortuous  +----------+--------+--------+--------+-----------------------+--------+  ICA Distal 97       31                                        tortuous  +----------+--------+--------+--------+-----------------------+--------+  ECA        65       9                                                   +----------+--------+--------+--------+-----------------------+--------+ +----------+--------+--------+--------+-------------------+             PSV cm/s EDV cm/s Describe Arm Pressure (mmHG)  +----------+--------+--------+--------+-------------------+  Subclavian 86                                              +----------+--------+--------+--------+-------------------+ +---------+--------+--+--------+--+---------+  Vertebral PSV cm/s 72 EDV cm/s 20 Antegrade  +---------+--------+--+--------+--+---------+   Summary: Right Carotid: Velocities in the right ICA are consistent with a 1-39% stenosis. Left Carotid: Velocities in the left ICA are consistent with a 1-39% stenosis.  Vertebrals: Bilateral vertebral arteries demonstrate antegrade flow. *See table(s) above for measurements and observations.  Electronically signed by Delia Heady MD on 07/22/2019 at 1:55:38 PM.    Final     Microbiology: Recent Results (from the past 240 hour(s))  SARS Coronavirus 2 by RT PCR (hospital order, performed in [facility] hospital lab) Nasopharyngeal Nasopharyngeal Swab     Status: None   Collection Time: 07/19/19  7:39 PM   Specimen: Nasopharyngeal Swab  Result Value Ref Range Status   SARS Coronavirus 2 NEGATIVE NEGATIVE Final    Comment: (NOTE) SARS-CoV-2 target nucleic acids are NOT DETECTED.  The SARS-CoV-2 RNA is generally detectable in upper and lower respiratory specimens during the acute  phase of infection. The lowest concentration of SARS-CoV-2 viral copies this assay can detect is 250 copies / mL. A negative result does not preclude SARS-CoV-2 infection and should not be used as the sole basis for treatment or other patient management decisions.  A negative result may occur with improper specimen collection / handling, submission of specimen other than nasopharyngeal swab, presence of viral mutation(s) within the areas targeted by this assay, and inadequate number of viral copies (<250 copies / mL). A negative result must be combined with clinical observations, patient history, and epidemiological information.  Fact Sheet for Patients:   BoilerBrush.com.cy  Fact Sheet for Healthcare Providers: https://pope.com/  This test is not yet approved or  cleared by the [place] FDA and has been authorized for detection and/or diagnosis of SARS-CoV-2 by FDA under an Emergency Use Authorization (EUA).  This EUA will remain in effect (meaning this test can be used) for the duration of the COVID-19 declaration under Section 564(b)(1) of the Act, 21 U.S.C. section 360bbb-3(b)(1), unless the authorization is terminated  or revoked sooner.  Performed at [facility], [address], [place], [place] [address]   MRSA PCR Screening     Status: None   Collection Time: 07/19/19  8:14 PM   Specimen: Nasal Mucosa; Nasopharyngeal  Result Value Ref Range Status   MRSA by PCR NEGATIVE NEGATIVE Final    Comment:        The GeneXpert MRSA Assay (FDA approved for NASAL specimens only), is one component of a comprehensive MRSA colonization surveillance program. It is not intended to diagnose MRSA infection nor to guide or monitor treatment for MRSA infections. Performed at [facility], [address], [place], [place] [address]   Culture, blood (routine x 2)     Status: None (Preliminary result)   Collection Time: 07/23/19  8:35 PM   Specimen: BLOOD  Result Value Ref Range Status   Specimen Description BLOOD RIGHT ANTECUBITAL  Final   Special Requests   Final    BOTTLES DRAWN AEROBIC AND ANAEROBIC Blood Culture adequate volume   Culture   Final    NO GROWTH 2 DAYS Performed at [facility], [address], [place], [place] [address]    Report Status PENDING  Incomplete  Culture, blood (routine x 2)     Status: None (Preliminary result)   Collection Time: 07/23/19  8:43 PM   Specimen: BLOOD RIGHT HAND  Result Value Ref Range Status   Specimen Description BLOOD RIGHT HAND  Final   Special Requests   Final    BOTTLES DRAWN AEROBIC ONLY Blood Culture results may not be optimal due to an inadequate volume of blood received in culture bottles   Culture   Final    NO GROWTH 2 DAYS Performed at [facility], [address], [place], [place] [address]    Report Status PENDING  Incomplete     Labs: Basic Metabolic Panel: Recent Labs  Lab 07/19/19 1900 07/19/19 1908 07/22/19 0419 07/25/19 0431 07/26/19 0432  NA 132* 135 134* 136 136  K 3.8 3.8 3.6 4.3 4.0  CL 99 98 99 101 100  CO2 24  --  25 26 28   GLUCOSE 101* 98 97 86 96  BUN <5* 4* 9 11 11   CREATININE 0.71 0.70 0.76  0.68 0.69  CALCIUM 8.3*  --  8.5* 8.6* 8.6*  MG  --   --   --  2.1  --  Liver Function Tests: Recent Labs  Lab 07/19/19 1900  AST 17  ALT 14  ALKPHOS 42  BILITOT 0.5  PROT 6.0*  ALBUMIN 3.6   No results for input(s): LIPASE, AMYLASE in the last 168 hours. No results for input(s): AMMONIA in the last 168 hours. CBC: Recent Labs  Lab 07/19/19 1900 07/19/19 1908 07/22/19 0419 07/25/19 0431 07/26/19 0432  WBC 7.2  --  8.5 5.8 6.0  NEUTROABS 4.2  --   --   --   --   HGB 12.2* 12.2* 11.7* 12.8* 11.8*  HCT 37.4* 36.0* 34.7* 38.6* 35.4*  MCV 94.7  --  91.8 93.7 92.2  PLT 228  --  202 122* 142*   Cardiac Enzymes: No results for input(s): CKTOTAL, CKMB, CKMBINDEX, TROPONINI in the last 168 hours. BNP: BNP (last 3 results) Recent Labs    07/25/19 0431  BNP 59.9    ProBNP (last 3 results) No results for input(s): PROBNP in the last 8760 hours.  CBG: Recent Labs  Lab 07/19/19 1900  GLUCAP 96       Signed:  Briant Cedar, MD [facility] 07/26/2019, 11:53 AM

## 2019-07-27 ENCOUNTER — Inpatient Hospital Stay (HOSPITAL_COMMUNITY): Payer: Medicare Other | Admitting: Physical Therapy

## 2019-07-27 ENCOUNTER — Inpatient Hospital Stay (HOSPITAL_COMMUNITY): Payer: Medicare Other | Admitting: Occupational Therapy

## 2019-07-27 ENCOUNTER — Other Ambulatory Visit: Payer: Self-pay

## 2019-07-27 ENCOUNTER — Inpatient Hospital Stay (HOSPITAL_COMMUNITY): Payer: Medicare Other | Admitting: Speech Pathology

## 2019-07-27 DIAGNOSIS — I69391 Dysphagia following cerebral infarction: Secondary | ICD-10-CM

## 2019-07-27 DIAGNOSIS — G40909 Epilepsy, unspecified, not intractable, without status epilepticus: Secondary | ICD-10-CM

## 2019-07-27 DIAGNOSIS — I480 Paroxysmal atrial fibrillation: Secondary | ICD-10-CM

## 2019-07-27 DIAGNOSIS — I610 Nontraumatic intracerebral hemorrhage in hemisphere, subcortical: Secondary | ICD-10-CM

## 2019-07-27 LAB — CBC WITH DIFFERENTIAL/PLATELET
Abs Immature Granulocytes: 0.03 10*3/uL (ref 0.00–0.07)
Basophils Absolute: 0.1 10*3/uL (ref 0.0–0.1)
Basophils Relative: 1 %
Eosinophils Absolute: 0.2 10*3/uL (ref 0.0–0.5)
Eosinophils Relative: 4 %
HCT: 36.4 % — ABNORMAL LOW (ref 39.0–52.0)
Hemoglobin: 12.2 g/dL — ABNORMAL LOW (ref 13.0–17.0)
Immature Granulocytes: 1 %
Lymphocytes Relative: 22 %
Lymphs Abs: 1.3 10*3/uL (ref 0.7–4.0)
MCH: 31 pg (ref 26.0–34.0)
MCHC: 33.5 g/dL (ref 30.0–36.0)
MCV: 92.6 fL (ref 80.0–100.0)
Monocytes Absolute: 0.8 10*3/uL (ref 0.1–1.0)
Monocytes Relative: 14 %
Neutro Abs: 3.7 10*3/uL (ref 1.7–7.7)
Neutrophils Relative %: 58 %
Platelets: 161 10*3/uL (ref 150–400)
RBC: 3.93 MIL/uL — ABNORMAL LOW (ref 4.22–5.81)
RDW: 12.3 % (ref 11.5–15.5)
WBC: 6.1 10*3/uL (ref 4.0–10.5)
nRBC: 0 % (ref 0.0–0.2)

## 2019-07-27 LAB — COMPREHENSIVE METABOLIC PANEL
ALT: 15 U/L (ref 0–44)
AST: 18 U/L (ref 15–41)
Albumin: 3 g/dL — ABNORMAL LOW (ref 3.5–5.0)
Alkaline Phosphatase: 38 U/L (ref 38–126)
Anion gap: 9 (ref 5–15)
BUN: 13 mg/dL (ref 8–23)
CO2: 27 mmol/L (ref 22–32)
Calcium: 8.7 mg/dL — ABNORMAL LOW (ref 8.9–10.3)
Chloride: 98 mmol/L (ref 98–111)
Creatinine, Ser: 0.71 mg/dL (ref 0.61–1.24)
GFR calc Af Amer: >60 mL/min (ref >60)
GFR calc non Af Amer: >60 mL/min (ref >60)
Glucose, Bld: 94 mg/dL (ref 70–99)
Potassium: 4.1 mmol/L (ref 3.5–5.1)
Sodium: 134 mmol/L — ABNORMAL LOW (ref 135–145)
Total Bilirubin: 0.3 mg/dL (ref 0.3–1.2)
Total Protein: 5.7 g/dL — ABNORMAL LOW (ref 6.5–8.1)

## 2019-07-27 MED ORDER — ATORVASTATIN CALCIUM 80 MG PO TABS
80.0000 mg | ORAL_TABLET | Freq: Every day | ORAL | Status: DC
  Administered 2019-07-27 – 2019-08-17 (×22): 80 mg via ORAL
  Filled 2019-07-27 (×22): qty 1

## 2019-07-27 MED ORDER — FLUCONAZOLE 100 MG PO TABS
100.0000 mg | ORAL_TABLET | Freq: Every day | ORAL | Status: AC
  Administered 2019-07-27 – 2019-07-28 (×2): 100 mg via ORAL
  Filled 2019-07-27 (×2): qty 1

## 2019-07-27 NOTE — Evaluation (Signed)
Speech Language Pathology Assessment and Plan  Patient Details  Name: [name] Cruz MRN: [id number] Date of Birth: 31-Aug-1955  SLP Diagnosis: Cognitive Impairments;Dysarthria;Dysphagia  Rehab Potential: Good ELOS: 3-4 weeks    Today's Date: 07/27/2019 SLP Individual Time: 5621-3086 SLP Individual Time Calculation (min): 60 min   Hospital Problem: Active Problems:   Thalamic hemorrhage [facility])  Past Medical History:  Past Medical History:  Diagnosis Date  . Acute deep vein thrombosis (DVT) of left upper extremity (HCC)    L brachial and basilic veins, eliquis started 08/22/2017 to continue for 3 months total   . Cognitive impairment 2007   after stroke, saw rehab but told to stop because was too upsetting to him  . History of chicken pox   . HLD (hyperlipidemia)   . HTN (hypertension)   . NSTEMI (non-ST elevated myocardial infarction) ([place]) 02/21/2017  . Obesity   . Stroke, hemorrhagic ([place]) 2007   thought 2/2 HTN (240sbp); residual cognitive impairment, loss of R peripheral field, no driving   Past Surgical History:  Past Surgical History:  Procedure Laterality Date  . ANKLE SURGERY  1990s   right foot with plate and screws  . CORONARY ARTERY BYPASS GRAFT N/A 02/24/2017   3v Procedure: CORONARY ARTERY BYPASS GRAFTING (CABG) x 3 ON PUMP USING LEFT INTERNAL MAMMARY ARTERY TO LEFT ANTERIOR DESENDING CORNARY ARTERY, RIGHT GREATER SAPHENOUS VEIN TO LEFT CIRCUMFLEX ARTERY AND POSTERIOR DESENDING ARTERY. RIGHT GREATER SAPHENOUS VEIN OBTAINED VIA ENDOVEIN HARVEST.;  Surgeon: Grace Isaac, MD  . IABP INSERTION N/A 02/21/2017   Procedure: IABP Insertion;  Surgeon: Nelva Bush, MD;  Location: [place] [facility];  Service: Cardiovascular;  Laterality: N/A;  . LEFT HEART CATH AND CORONARY ANGIOGRAPHY N/A 02/21/2017   Procedure: LEFT HEART CATH AND CORONARY ANGIOGRAPHY;  Surgeon: Nelva Bush, MD;  Location: [place] [facility];  Service: Cardiovascular;  Laterality: N/A;  . TEE  WITHOUT CARDIOVERSION N/A 02/24/2017   Procedure: TRANSESOPHAGEAL ECHOCARDIOGRAM (TEE);  Surgeon: Grace Isaac, MD;  Location: [place];  Service: Open Heart Surgery;  Laterality: N/A;    Assessment / Plan / Recommendation Clinical Impression Patient is a 64 y.o.right handedmalehyperlipidemia,seizure disorder, bipolar disorder maintained on Geodon, CAD with CABG 2019, hypertension with history of deep vein thrombosis previously on Eliquis discontinued due to hemorrhagic CVA 2007. Presented 07/19/2019 with left-sided weakness and slurred speech. Noted systolic blood pressure 578I. Cranial CT scan showed a 1.4 x 2.0 x 2.6 cm acute parenchymal hemorrhage centered within the right thalamocapsularjunction. Redemonstrated a chronic left temporoparietal infarction. Therapy evaluation completed with recommendations of physical medicine rehab consult. Patient admitted 07/26/19.  Patient has a history of cognitive deficits but appears to demonstrate overall mild cognitive impairments impacting basic problem solving, attention to left field of environment, intellectual awareness and recall of functional information which impacts his safety with functional and familiar tasks. Patient has mild dysarthria due to imprecise consonants but is overall ~80% intelligible at the sentence level. Patient has moderate oral-motor weakness resulting in left anterior spillage, mildly prolonged mastication and mild oral residue with solid textures. Patient consumed trials of thin liquids without overt s/s of aspiration but demonstrated what appeared to be a delayed swallow initiation and use of multiple swallows. Patient will need a repeat MBS prior to upgrade. Recommend patient continue current diet of Dys. 3 textures with nectar-thick liquids with full supervision. Patient would benefit from skilled SLP intervention to maximize his swallowing and cognitive function and overall speech intelligibility prior to discharge.  Skilled Therapeutic Interventions          Administered a cognitive-linguistic evaluation and BSE, please see above for details.   SLP Assessment  Patient will need skilled Speech Lanaguage Pathology Services during CIR admission    Recommendations  SLP Diet Recommendations: Dysphagia 3 (Mech soft);Nectar Liquid Administration via: Cup;No straw Medication Administration: Whole meds with puree Supervision: Full supervision/cueing for compensatory strategies;Patient able to self feed Compensations: Slow rate;Small sips/bites;Monitor for anterior loss;Minimize environmental distractions Postural Changes and/or Swallow Maneuvers: Seated upright 90 degrees Oral Care Recommendations: Oral care BID Recommendations for Other Services: Neuropsych consult Patient destination: Home Follow up Recommendations: 24 hour supervision/assistance;Home Health SLP Equipment Recommended: To be determined    SLP Frequency 3 to 5 out of 7 days   SLP Duration  SLP Intensity  SLP Treatment/Interventions 3-4 weeks  Minumum of 1-2 x/day, 30 to 90 minutes  Cognitive remediation/compensation;Dysphagia/aspiration precaution training;Internal/external aids;Speech/Language facilitation;Cueing hierarchy;Environmental controls;Therapeutic Activities;Functional tasks;Patient/family education    Pain No/Denies Pain    SLP Evaluation Cognition Overall Cognitive Status: Impaired/Different from baseline Arousal/Alertness: Awake/alert Orientation Level: Oriented X4 Attention: Sustained Sustained Attention: Appears intact Memory: Impaired Memory Impairment: Decreased short term memory Decreased Short Term Memory: Verbal basic;Functional basic Awareness: Impaired Awareness Impairment: Intellectual impairment Problem Solving: Impaired Problem Solving Impairment: Functional basic;Verbal basic Safety/Judgment: Impaired  Comprehension Auditory Comprehension Overall Auditory Comprehension: Appears within  functional limits for tasks assessed Expression Expression Primary Mode of Expression: Verbal Verbal Expression Overall Verbal Expression: Appears within functional limits for tasks assessed Written Expression Dominant Hand: Right Written Expression: Not tested Oral Motor Oral Motor/Sensory Function Overall Oral Motor/Sensory Function: Moderate impairment Facial ROM: Reduced left;Suspected CN VII (facial) dysfunction Facial Symmetry: Abnormal symmetry left;Suspected CN VII (facial) dysfunction Facial Strength: Reduced left;Suspected CN VII (facial) dysfunction Lingual ROM: Within Functional Limits;Other (Comment) Velum: Within Functional Limits Motor Speech Overall Motor Speech: Impaired Respiration: Within functional limits Phonation: Normal Resonance: Within functional limits Articulation: Impaired Level of Impairment: Sentence Intelligibility: Intelligibility reduced Word: 75-100% accurate Phrase: 75-100% accurate Sentence: 75-100% accurate Conversation: 75-100% accurate Motor Planning: Witnin functional limits Effective Techniques: Slow rate;Increased vocal intensity;Over-articulate  Bedside Swallowing Assessment General Date of Onset: 07/19/19 Previous Swallow Assessment: MBS 7/7: Recommended Dys. 3 textures with nectar-thick liquids Diet Prior to this Study: Dysphagia 3 (soft);Nectar-thick liquids Temperature Spikes Noted: No Respiratory Status: Room air History of Recent Intubation: No Behavior/Cognition: Alert;Cooperative;Requires cueing Oral Cavity - Dentition: Dentures, top Self-Feeding Abilities: Needs assist Vision: Functional for self-feeding Patient Positioning: Upright in bed Baseline Vocal Quality: Normal Volitional Cough: Weak Volitional Swallow: Able to elicit  Ice Chips Ice chips: Impaired Presentation: Spoon Pharyngeal Phase Impairments: Multiple swallows Thin Liquid Thin Liquid: Impaired Presentation: Cup Pharyngeal  Phase Impairments:  Multiple swallows Nectar Thick Nectar Thick Liquid: Within functional limits Presentation: Cup;Self Fed Honey Thick Not Tested  Puree Puree: Impaired Oral Phase Impairments: Reduced labial seal Oral Phase Functional Implications: Left anterior spillage Solid Solid: Impaired Presentation: Self Fed Oral Phase Functional Implications: Oral residue BSE Assessment Risk for Aspiration Impact on safety and function: Mild aspiration risk;Moderate aspiration risk Other Related Risk Factors: Cognitive impairment;Previous CVA  Short Term Goals: Week 1: SLP Short Term Goal 1 (Week 1): Patient will utilize speech intelligibility strategies at the sentence level to achieve ~90% intelligibility with supervision verbal cues. SLP Short Term Goal 2 (Week 1): Patient will consume current diet with minimal overt s/s of aspiration with overall supervision verbal cues for use of swallowing compensatory strategies. SLP Short Term Goal 3 (Week 1): Patient  will consume trials of thin liquids with minimal overt s/s of aspiration over 2 sessions to assess readiness for repeat MBS. SLP Short Term Goal 4 (Week 1): Patient will demonstrate functional problem solving for basic and familiar tasks with Min A verbal cues. SLP Short Term Goal 5 (Week 1): Patient will attend to left field of enviornment during functional tasks with Min A verbal cues. SLP Short Term Goal 6 (Week 1): Patient will recall new, daily information with Min A verbal cues.  Refer to Care Plan for Long Term Goals  Recommendations for other services: Neuropsych  Discharge Criteria: Patient will be discharged from SLP if patient refuses treatment 3 consecutive times without medical reason, if treatment goals not met, if there is a change in medical status, if patient makes no progress towards goals or if patient is discharged from hospital.  The above assessment, treatment plan, treatment alternatives and goals were discussed and mutually agreed  upon: by patient  Keanu Frickey 07/27/2019, 10:44 AM

## 2019-07-27 NOTE — Progress Notes (Signed)
Inpatient Rehabilitation  Patient information reviewed and entered into eRehab system by Deajah Erkkila M. Olia Hinderliter, M.A., CCC/SLP, PPS Coordinator.  Information including medical coding, functional ability and quality indicators will be reviewed and updated through discharge.    

## 2019-07-27 NOTE — Patient Care Conference (Signed)
Inpatient RehabilitationTeam Conference and Plan of Care Update Date: 07/27/2019   Time: 2:03 PM    Patient Name: [name] Cruz      Medical Record Number: [id number]  Date of Birth: 10-14-1955 Sex: Male         Room/Bed: 4M09C/4M09C-01 Payor Info: Payor: Multimedia programmer / Plan: [employer] MEDICARE / Product Type: *No Product type* /    Admit Date/Time:  07/26/2019  5:03 PM  Primary Diagnosis:  <principal problem not specified>  Hospital Problems: Active Problems:   Thalamic hemorrhage [facility])    Expected Discharge Date: Expected Discharge Date:  (3-4 Weeks)  Team Members Present: Physician leading conference: Dr. Faith Rogue Care Coodinator Present: Cecile Sheerer, LCSWA;Payten Beaumier Marlyne Beards, RN, BSN, CRRN Nurse Present: Otilio Carpen, RN PT Present: Aleda Grana, PT OT Present: Kearney Hard, OT SLP Present: Feliberto Gottron, SLP PPS Coordinator present : Fae Pippin, SLP     Current Status/Progress Goal Weekly Team Focus  Bowel/Bladder   Incontinent x2 LBM 7/10  Fewer periods of incontinece with times toileting  assess toileting needs qshift and PRN   Swallow/Nutrition/ Hydration   Dys. 3 textures with nectar-thick liquids, Min A  Mod I  tolerance of current diet, trials of upgraded liquids, pharyngeal strengthening exercises   ADL's   pending eval         Mobility   max assist bed mobility, stedy +2 bed<>w/c, L inattention, impaired cogntion & awareness, L pusher  min assist transfers & short distance gait with LRAD, supervision w/c mobility  L NMR, midline orientation, balance, transfers, awareness, cognitive remediation   Communication   Supervision  Mod I  use of swallowing compensatory strategies   Safety/Cognition/ Behavioral Observations  Mod A  Supervision  attention to left, basic problem solving, recall   Pain   No c/o pain  remain pain free  assess pain qshift and PRN   Skin   No skin impairments  remain free of skin impairments  assess skin  qshift and PRN     Team Discussion:  Discharge Planning/Teaching Needs:    TBD     Current Update:    Current Barriers to Discharge:  Incontinence  Possible Resolutions to Barriers: Time toileting schedule.  Patient on target to meet rehab goals: yes, OT eval pending. +2 assist with stedy, heavy left pusher. Dys3/Nectar, Pt has good family support. *See Care Plan and progress notes for long and short-term goals.   Revisions to Treatment Plan:  None    Medical Summary Current Status: right thalamic hemorrhage. dense left hemiparesis, no pain control, BP/HR normal Weekly Focus/Goal: bp/hr control, maximize nutrition, minimze aspiration risk  Barriers to Discharge: Medical stability   Possible Resolutions to Barriers: daily labs, medical mgt, advance diet   Continued Need for Acute Rehabilitation Level of Care: The patient requires daily medical management by a physician with specialized training in physical medicine and rehabilitation for the following reasons: Direction of a multidisciplinary physical rehabilitation program to maximize functional independence : Yes Medical management of patient stability for increased activity during participation in an intensive rehabilitation regime.: Yes Analysis of laboratory values and/or radiology reports with any subsequent need for medication adjustment and/or medical intervention. : Yes   I attest that I was present, lead the team conference, and concur with the assessment and plan of the team.   Tennis Must 07/27/2019, 2:03 PM

## 2019-07-27 NOTE — Plan of Care (Signed)
Problem: RH Balance Goal: LTG: Patient will maintain dynamic sitting balance (OT) Description: LTG:  Patient will maintain dynamic sitting balance with assistance during activities of daily living (OT) Flowsheets (Taken 07/27/2019 1301) LTG: Pt will maintain dynamic sitting balance during ADLs with: Supervision/Verbal cueing Goal: LTG Patient will maintain dynamic standing with ADLs (OT) Description: LTG:  Patient will maintain dynamic standing balance with assist during activities of daily living (OT)  Flowsheets (Taken 07/27/2019 1301) LTG: Pt will maintain dynamic standing balance during ADLs with: Minimal Assistance - Patient > 75%   Problem: Sit to Stand Goal: LTG:  Patient will perform sit to stand in prep for activites of daily living with assistance level (OT) Description: LTG:  Patient will perform sit to stand in prep for activites of daily living with assistance level (OT) Flowsheets (Taken 07/27/2019 1301) LTG: PT will perform sit to stand in prep for activites of daily living with assistance level: Minimal Assistance - Patient > 75%   Problem: RH Eating Goal: LTG Patient will perform eating w/assist, cues/equip (OT) Description: LTG: Patient will perform eating with assist, with/without cues using equipment (OT) Flowsheets (Taken 07/27/2019 1301) LTG: Pt will perform eating with assistance level of: Supervision/Verbal cueing   Problem: RH Grooming Goal: LTG Patient will perform grooming w/assist,cues/equip (OT) Description: LTG: Patient will perform grooming with assist, with/without cues using equipment (OT) Flowsheets (Taken 07/27/2019 1301) LTG: Pt will perform grooming with assistance level of: Supervision/Verbal cueing   Problem: RH Bathing Goal: LTG Patient will bathe all body parts with assist levels (OT) Description: LTG: Patient will bathe all body parts with assist levels (OT) Flowsheets (Taken 07/27/2019 1301) LTG: Pt will perform bathing with assistance  level/cueing: Minimal Assistance - Patient > 75%   Problem: RH Dressing Goal: LTG Patient will perform upper body dressing (OT) Description: LTG Patient will perform upper body dressing with assist, with/without cues (OT). Flowsheets (Taken 07/27/2019 1301) LTG: Pt will perform upper body dressing with assistance level of: Supervision/Verbal cueing Goal: LTG Patient will perform lower body dressing w/assist (OT) Description: LTG: Patient will perform lower body dressing with assist, with/without cues in positioning using equipment (OT) Flowsheets (Taken 07/27/2019 1301) LTG: Pt will perform lower body dressing with assistance level of: Minimal Assistance - Patient > 75%   Problem: RH Toileting Goal: LTG Patient will perform toileting task (3/3 steps) with assistance level (OT) Description: LTG: Patient will perform toileting task (3/3 steps) with assistance level (OT)  Flowsheets (Taken 07/27/2019 1301) LTG: Pt will perform toileting task (3/3 steps) with assistance level: Minimal Assistance - Patient > 75%   Problem: RH Functional Use of Upper Extremity Goal: LTG Patient will use RT/LT upper extremity as a (OT) Description: LTG: Patient will use right/left upper extremity as a stabilizer/gross assist/diminished/nondominant/dominant level with assist, with/without cues during functional activity (OT) Flowsheets (Taken 07/27/2019 1301) LTG: Use of upper extremity in functional activities: LUE as a stabilizer   Problem: RH Toilet Transfers Goal: LTG Patient will perform toilet transfers w/assist (OT) Description: LTG: Patient will perform toilet transfers with assist, with/without cues using equipment (OT) Flowsheets (Taken 07/27/2019 1301) LTG: Pt will perform toilet transfers with assistance level of: Minimal Assistance - Patient > 75%   Problem: RH Tub/Shower Transfers Goal: LTG Patient will perform tub/shower transfers w/assist (OT) Description: LTG: Patient will perform tub/shower  transfers with assist, with/without cues using equipment (OT) Flowsheets (Taken 07/27/2019 1301) LTG: Pt will perform tub/shower stall transfers with assistance level of: Minimal Assistance - Patient > 75%  Problem: RH Attention Goal: LTG Patient will demonstrate this level of attention during functional activites (OT) Description: LTG:  Patient will demonstrate this level of attention during functional activites  (OT) Flowsheets (Taken 07/27/2019 1301) LTG: Patient will demonstrate this level of attention during functional activites (OT): Minimal Assistance - Patient > 75%   Problem: RH Awareness Goal: LTG: Patient will demonstrate awareness during functional activites type of (OT) Description: LTG: Patient will demonstrate awareness during functional activites type of (OT) Flowsheets (Taken 07/27/2019 1301) LTG: Patient will demonstrate awareness during functional activites type of (OT): Minimal Assistance - Patient > 75%

## 2019-07-27 NOTE — H&P (Signed)
Physical Medicine and Rehabilitation Admission H&P        Chief Complaint  Patient presents with  . Code Stroke  : HPI: [name] Cruz is a 64 year old right-handedhanded male with history of hyperlipidemia, seizure disorder maintained on Depakote and Vimpat as well as Keppra, punctate white matter infarction 07/2018, bipolar disorder maintained on Geodon, CAD with CABG 2019, hypertension as well as history of deep vein thrombosis previously on Eliquis discontinued due to hemorrhagic CVA 2007.  Per chart review lives with sister.  1 level home 3 steps to entry.  Reportedly independent prior to admission.  Presented 07/19/2018 while left-sided weakness and slurred speech.  Noted systolic blood pressure in the 240s.  Cranial CT scan showed a 1.4 x 2.0 x 2.6 cm parenchymal hemorrhage centered within the right thalamocapsular junction.  Redemonstrated a chronic left temporoparietal infarction.  Echocardiogram with ejection fraction of 60% without emboli.  No regional wall motion abnormalities.  MRI follow-up unchanged size of parenchymal hemorrhage.  Carotid Dopplers with no ICA stenosis.  Alcohol level negative, sodium 132, urine drug screen negative nitrite.  Cleviprex added for blood pressure control.  He remained on Vimpat/Keppra as well as Depakote prior to admission.  Dysphagia #3 nectar thick liquid diet.  Patient was admitted for a comprehensive rehab program.   Review of Systems  Constitutional: Negative for chills and fever.  HENT: Negative for hearing loss.   Eyes: Negative for blurred vision and double vision.  Respiratory: Negative for shortness of breath.   Cardiovascular: Positive for palpitations. Negative for chest pain and leg swelling.  Gastrointestinal: Positive for constipation. Negative for heartburn, nausea and vomiting.  Genitourinary: Negative for dysuria, flank pain and hematuria.  Musculoskeletal: Positive for myalgias.  Skin: Negative for rash.  Neurological: Positive for  speech change, seizures and weakness.  All other systems reviewed and are negative.       Past Medical History:  Diagnosis Date  . Acute deep vein thrombosis (DVT) of left upper extremity (HCC)      L brachial and basilic veins, eliquis started 08/22/2017 to continue for 3 months total   . Cognitive impairment 2007    after stroke, saw rehab but told to stop because was too upsetting to him  . History of chicken pox    . HLD (hyperlipidemia)    . HTN (hypertension)    . NSTEMI (non-ST elevated myocardial infarction) (HCC) 02/21/2017  . Obesity    . Stroke, hemorrhagic (HCC) 2007    thought 2/2 HTN (240sbp); residual cognitive impairment, loss of R peripheral field, no driving         Past Surgical History:  Procedure Laterality Date  . ANKLE SURGERY   1990s    right foot with plate and screws  . CORONARY ARTERY BYPASS GRAFT N/A 02/24/2017    3v Procedure: CORONARY ARTERY BYPASS GRAFTING (CABG) x 3 ON PUMP USING LEFT INTERNAL MAMMARY ARTERY TO LEFT ANTERIOR DESENDING CORNARY ARTERY, RIGHT GREATER SAPHENOUS VEIN TO LEFT CIRCUMFLEX ARTERY AND POSTERIOR DESENDING ARTERY. RIGHT GREATER SAPHENOUS VEIN OBTAINED VIA ENDOVEIN HARVEST.;  Surgeon: Delight OvensGerhardt, Edward B, MD  . IABP INSERTION N/A 02/21/2017    Procedure: IABP Insertion;  Surgeon: Yvonne KendallEnd, Christopher, MD;  Location: [facility];  Service: Cardiovascular;  Laterality: N/A;  . LEFT HEART CATH AND CORONARY ANGIOGRAPHY N/A 02/21/2017    Procedure: LEFT HEART CATH AND CORONARY ANGIOGRAPHY;  Surgeon: Yvonne KendallEnd, Christopher, MD;  Location: [facility];  Service: Cardiovascular;  Laterality:  N/A;  . TEE WITHOUT CARDIOVERSION N/A 02/24/2017    Procedure: TRANSESOPHAGEAL ECHOCARDIOGRAM (TEE);  Surgeon: Delight Ovens, MD;  Location: [facility];  Service: Open Heart Surgery;  Laterality: N/A;         Family History  Problem Relation Age of Onset  . Alzheimer's disease Maternal Grandfather    . Cancer Mother          lymphoma  . Alcohol abuse  Father          smoker  . Coronary artery disease Neg Hx    . Stroke Neg Hx    . Diabetes Neg Hx      Social History:  reports that he has never smoked. He has quit using smokeless tobacco. He reports previous alcohol use. He reports that he does not use drugs. Allergies:       Allergies  Allergen Reactions  . Losartan Other (See Comments)      hyperkalemia          Medications Prior to Admission  Medication Sig Dispense Refill  . aspirin EC 81 MG tablet Take 1 tablet (81 mg total) by mouth daily.      Marland Kitchen atorvastatin (LIPITOR) 80 MG tablet TAKE 1 TABLET BY MOUTH EVERY DAY (Patient taking differently: Take 80 mg by mouth daily. ) 90 tablet 0  . carvedilol (COREG) 3.125 MG tablet TAKE 1 TABLET (3.125 MG TOTAL) BY MOUTH 2 (TWO) TIMES DAILY. 180 tablet 0  . Cyanocobalamin (B-12) 1000 MCG SUBL Place 1 tablet under the tongue daily. (Patient taking differently: Place 1,000 mcg under the tongue daily. ) 90 each 0  . divalproex (DEPAKOTE) 500 MG DR tablet Take 1 tablet (500 mg total) by mouth 2 (two) times daily. 60 tablet 2  . Lacosamide (VIMPAT) 100 MG TABS Take 2 tablets (200 mg total) by mouth in the morning and at bedtime. 200 mg 2 times a day.  Total 400 mg. 120 tablet 0  . levETIRAcetam (KEPPRA) 750 MG tablet Take 2 tablets (1,500 mg total) by mouth 2 (two) times daily. 120 tablet 6  . Multiple Vitamin (MULTIVITAMIN WITH MINERALS) TABS tablet Take 1 tablet by mouth daily. 90 tablet 0  . sertraline (ZOLOFT) 25 MG tablet TAKE 1 TABLET BY MOUTH EVERY DAY (Patient taking differently: Take 25 mg by mouth daily. ) 90 tablet 1  . ziprasidone (GEODON) 40 MG capsule TAKE 1 CAPSULE (40 MG TOTAL) BY MOUTH AT BEDTIME. 90 capsule 1      Drug Regimen Review Drug regimen was reviewed and remains appropriate with no significant issues identified   Home: Home Living Family/patient expects to be discharged to:: Private residence Living Arrangements: Other relatives (sister) Available Help at  Discharge: Family, Available 24 hours/day Type of Home: Mobile home Home Access: Stairs to enter [employer] of Steps: 3 Entrance Stairs-Rails: Can reach both Home Layout: One level Bathroom Shower/Tub: Engineer, manufacturing systems: Standard Bathroom Accessibility: Yes Home Equipment: Environmental consultant - 2 wheels, Cane - single point, Bedside commode, Shower seat, Grab bars - tub/shower  Lives With: Family   Functional History: Prior Function Level of Independence: Independent Comments: enjoys mowing grass and doing yardwork; sister helps wiht Tourist information centre manager; sister pays bills; does not dirve   Functional Status:  Mobility: Bed Mobility Overal bed mobility: Needs Assistance Bed Mobility: Supine to Sit Supine to sit: Max assist, HOB elevated Sit to supine: Max assist General bed mobility comments: Max A to come to EOB, able to complete trunk engagement and  neuromuscular re-education at EOB with with mod A, attemtped to clear hips to stand, pt unable Transfers Overall transfer level: Needs assistance Equipment used: 1 person hand held assist, 2 person hand held assist Transfers: Sit to/from Stand Sit to Stand: +2 physical assistance, Mod assist Stand pivot transfers: Mod assist, +2 physical assistance General transfer comment: Attempted to clear bottom x2 sitting EOB, unable to clear even with knee blocked   ADL: ADL Overall ADL's : Needs assistance/impaired Eating/Feeding: Supervision/ safety, Set up Eating/Feeding Details (indicate cue type and reason): asked tnsg to check for pocketing Grooming: Moderate assistance, Sitting, Wash/dry hands, Wash/dry face Grooming Details (indicate cue type and reason): Noted discharge from L eye Upper Body Bathing: Moderate assistance, Sitting Lower Body Bathing: Maximal assistance, Sit to/from stand Upper Body Dressing : Moderate assistance, Sitting Lower Body Dressing: Maximal assistance, Sit to/from stand Toilet Transfer: +2  for physical assistance, Moderate assistance Toilet Transfer Details (indicate cue type and reason): simulated; poor awareness Toileting- Clothing Manipulation and Hygiene: Maximal assistance Toileting - Clothing Manipulation Details (indicate cue type and reason): foley Functional mobility during ADLs: Moderate assistance, +2 for physical assistance, Cueing for safety, Cueing for sequencing General ADL Comments: Session focus on neuromuscular re-education and bed level exercises to aid in mobility and strength   Cognition: Cognition Overall Cognitive Status: Impaired/Different from baseline Arousal/Alertness: Awake/alert Orientation Level: Oriented to person, Oriented to place, Oriented to situation, Disoriented to time Awareness: Impaired Awareness Impairment: Intellectual impairment Problem Solving: Impaired Problem Solving Impairment: Functional basic, Verbal basic Cognition Arousal/Alertness: Lethargic Behavior During Therapy: WFL for tasks assessed/performed Overall Cognitive Status: Impaired/Different from baseline Area of Impairment: Attention, Memory, Following commands, Safety/judgement, Awareness, Problem solving, Orientation Orientation Level: Disoriented to, Time Current Attention Level: Sustained Memory: Decreased short-term memory Following Commands: Follows one step commands inconsistently, Follows one step commands with increased time Safety/Judgement: Decreased awareness of safety, Decreased awareness of deficits Awareness: Intellectual Problem Solving: Slow processing, Difficulty sequencing, Requires verbal cues, Requires tactile cues, Decreased initiation General Comments: Was able to talk about his tomatoes, however requires increased time to answer further questions however often states "I understand" Pt with difficulty motor planning bed level exercises, even on unaffected side   Physical Exam: Blood pressure 95/64, pulse 61, temperature 98.2 F (36.8 C), resp.  rate 17, weight 84.5 kg, SpO2 98 %. Physical Exam Constitutional:      Appearance: Normal appearance.  HENT:     Head: Normocephalic and atraumatic.     Nose: Nose normal. No congestion.     Mouth/Throat:     Comments: Thrush on tongue Eyes:     Extraocular Movements: Extraocular movements intact.     Pupils: Pupils are equal, round, and reactive to light.  Cardiovascular:     Rate and Rhythm: Normal rate and regular rhythm.     Heart sounds: No murmur heard.  No friction rub.  Pulmonary:     Effort: Pulmonary effort is normal. No respiratory distress.     Breath sounds: No stridor. No wheezing or rhonchi.  Abdominal:     General: Abdomen is flat. There is no distension.     Palpations: There is no mass.  Musculoskeletal:     Cervical back: Normal range of motion.  Skin:    Comments: Old sternal incision  Neurological:     Mental Status: He is alert.     Comments: A&O x 3. Normal language. Provides biographical information. Follows all commands.  Reasonable insight and awareness. speech slightly dysarthric. Left central 7.  LUE 0/5. LLE 1 to 1+/5. RUE and RLE 4+ to 5/5. Sensation tr/2 LUE and 1/2 LLE. Normal sensation on right. No resting tone.      Psychiatric:        Mood and Affect: Mood normal.        Behavior: Behavior normal.        Lab Results Last 48 Hours        Results for orders placed or performed during the hospital encounter of 07/19/19 (from the past 48 hour(s))  CBC     Status: Abnormal    Collection Time: 07/22/19  4:19 AM  Result Value Ref Range    WBC 8.5 4.0 - 10.5 K/uL    RBC 3.78 (L) 4.22 - 5.81 MIL/uL    Hemoglobin 11.7 (L) 13.0 - 17.0 g/dL    HCT 56.2 (L) 39 - 52 %    MCV 91.8 80.0 - 100.0 fL    MCH 31.0 26.0 - 34.0 pg    MCHC 33.7 30.0 - 36.0 g/dL    RDW 13.0 86.5 - 78.4 %    Platelets 202 150 - 400 K/uL    nRBC 0.0 0.0 - 0.2 %      Comment: Performed at [facility], [address] [place], [place] [address]  Basic metabolic panel      Status: Abnormal    Collection Time: 07/22/19  4:19 AM  Result Value Ref Range    Sodium 134 (L) 135 - 145 mmol/L    Potassium 3.6 3.5 - 5.1 mmol/L    Chloride 99 98 - 111 mmol/L    CO2 25 22 - 32 mmol/L    Glucose, Bld 97 70 - 99 mg/dL      Comment: Glucose reference range applies only to samples taken after fasting for at least 8 hours.    BUN 9 8 - 23 mg/dL    Creatinine, Ser 5.28 0.61 - 1.24 mg/dL    Calcium 8.5 (L) 8.9 - 10.3 mg/dL    GFR calc non Af Amer >60 >60 mL/min    GFR calc Af Amer >60 >60 mL/min    Anion gap 10 5 - 15      Comment: Performed at [facility], [address], [place], [place] [address]       Imaging Results (Last 48 hours)  DG Swallowing Func-Speech Pathology   Result Date: 07/21/2019 Objective Swallowing Evaluation: Type of Study: MBS-Modified Barium Swallow Study  Patient Details Name: REGINALD WEIDA MRN: [id number] Date of Birth: Oct 18, 1955 Today's Date: 07/21/2019 Time: SLP Start Time (ACUTE ONLY): 1310 -SLP Stop Time (ACUTE ONLY): 1334 SLP Time Calculation (min) (ACUTE ONLY): 24 min Past Medical History: Past Medical History: Diagnosis Date . Acute deep vein thrombosis (DVT) of left upper extremity (HCC)   L brachial and basilic veins, eliquis started 08/22/2017 to continue for 3 months total  . Cognitive impairment 2007  after stroke, saw rehab but told to stop because was too upsetting to him . History of chicken pox  . HLD (hyperlipidemia)  . HTN (hypertension)  . NSTEMI (non-ST elevated myocardial infarction) (HCC) 02/21/2017 . Obesity  . Stroke, hemorrhagic (HCC) 2007  thought 2/2 HTN (240sbp); residual cognitive impairment, loss of R peripheral field, no driving Past Surgical History: Past Surgical History: Procedure Laterality Date . ANKLE SURGERY  1990s  right foot with plate and screws . CORONARY ARTERY BYPASS GRAFT N/A 02/24/2017  3v Procedure: CORONARY ARTERY BYPASS GRAFTING (CABG) x 3  ON PUMP USING LEFT INTERNAL MAMMARY ARTERY TO LEFT ANTERIOR  DESENDING CORNARY ARTERY, RIGHT GREATER SAPHENOUS VEIN TO LEFT CIRCUMFLEX ARTERY AND POSTERIOR DESENDING ARTERY. RIGHT GREATER SAPHENOUS VEIN OBTAINED VIA ENDOVEIN HARVEST.;  Surgeon: Delight Ovens, MD . IABP INSERTION N/A 02/21/2017  Procedure: IABP Insertion;  Surgeon: Yvonne Kendall, MD;  Location: [facility];  Service: Cardiovascular;  Laterality: N/A; . LEFT HEART CATH AND CORONARY ANGIOGRAPHY N/A 02/21/2017  Procedure: LEFT HEART CATH AND CORONARY ANGIOGRAPHY;  Surgeon: Yvonne Kendall, MD;  Location: [facility];  Service: Cardiovascular;  Laterality: N/A; . TEE WITHOUT CARDIOVERSION N/A 02/24/2017  Procedure: TRANSESOPHAGEAL ECHOCARDIOGRAM (TEE);  Surgeon: Delight Ovens, MD;  Location: [facility] OR;  Service: Open Heart Surgery;  Laterality: N/A; HPI: Pt is a 64 yo male presenting with sudden onset L sided weakness. CT showed R thalamic hemorrhage. PMH: DVT, hemorrhagic stroke with residual cognitive-linguistic impairment, HTN, HLD, seizures, NSTEMI  Subjective: alert, cooperative, limited historian Assessment / Plan / Recommendation CHL IP CLINICAL IMPRESSIONS 07/21/2019 Clinical Impression Pt presents with a moderate oropharyngeal dysphagia. He has limited and slow lingual movement, resulting in reduced lingual control for containment and posterior propulsion. Mastication is disorganized, but overall there is mild oral residue and minimal anterior spillage. Swallow triggers primarily at the pyriform sinuses with thin liquids spilling over into the airway silently and in small amounts before the swallow is initiated. He also has reduced pharyngeal squeeze, base of tongue retraction, and anterior hyolaryngeal movement that leaves mild residue in the pyriform sinuses. With residue already present int he pharynx, straw sips of nectar thick liquids are also silent aspirated before the swallow. A chin tuck does not improve his airway protection and he needs moderate amounts of cueing to implement  it, making it an ineffective strategy. He had improved ariway protection with thin liquids when cued to take a small sip, hold it anteriorly in his mouth, and then swallow. Although it was still triggered at the pyriform sinuses, he had improving coordination and smaller boluses sizes that could be better contained. Recommend keeping him on Dys 3 diet and nectar thick liquids with SLP f/u to facilitate strategy use with therapeutic trials of thin liquids and potential exercises.  SLP Visit Diagnosis Dysphagia, oropharyngeal phase (R13.12) Attention and concentration deficit following -- Frontal lobe and executive function deficit following -- Impact on safety and function Mild aspiration risk;Moderate aspiration risk   CHL IP TREATMENT RECOMMENDATION 07/21/2019 Treatment Recommendations Therapy as outlined in treatment plan below   Prognosis 07/21/2019 Prognosis for Safe Diet Advancement Good Barriers to Reach Goals Cognitive deficits;Language deficits Barriers/Prognosis Comment -- CHL IP DIET RECOMMENDATION 07/21/2019 SLP Diet Recommendations Dysphagia 3 (Mech soft) solids;Nectar thick liquid Liquid Administration via Cup;No straw Medication Administration Whole meds with puree Compensations Slow rate;Small sips/bites;Monitor for anterior loss Postural Changes Seated upright at 90 degrees   CHL IP OTHER RECOMMENDATIONS 07/21/2019 Recommended Consults -- Oral Care Recommendations Oral care BID Other Recommendations Order thickener from pharmacy;Prohibited food (jello, ice cream, thin soups);Remove water pitcher   CHL IP FOLLOW UP RECOMMENDATIONS 07/21/2019 Follow up Recommendations Inpatient Rehab   CHL IP FREQUENCY AND DURATION 07/21/2019 Speech Therapy Frequency (ACUTE ONLY) min 2x/week Treatment Duration 2 weeks      CHL IP ORAL PHASE 07/21/2019 Oral Phase Impaired Oral - Pudding Teaspoon -- Oral - Pudding Cup -- Oral - Honey Teaspoon -- Oral - Honey Cup -- Oral - Nectar Teaspoon -- Oral - Nectar Cup Weak lingual  manipulation;Reduced posterior propulsion;Lingual/palatal residue;Decreased  bolus cohesion;Delayed oral transit Oral - Nectar Straw Weak lingual manipulation;Reduced posterior propulsion;Lingual/palatal residue;Decreased bolus cohesion;Delayed oral transit Oral - Thin Teaspoon -- Oral - Thin Cup Weak lingual manipulation;Reduced posterior propulsion;Lingual/palatal residue;Decreased bolus cohesion;Delayed oral transit Oral - Thin Straw Weak lingual manipulation;Reduced posterior propulsion;Lingual/palatal residue;Decreased bolus cohesion;Delayed oral transit Oral - Puree Weak lingual manipulation;Reduced posterior propulsion;Lingual/palatal residue;Decreased bolus cohesion;Delayed oral transit Oral - Mech Soft Weak lingual manipulation;Reduced posterior propulsion;Lingual/palatal residue;Decreased bolus cohesion;Delayed oral transit;Impaired mastication Oral - Regular -- Oral - Multi-Consistency -- Oral - Pill -- Oral Phase - Comment --  CHL IP PHARYNGEAL PHASE 07/21/2019 Pharyngeal Phase Impaired Pharyngeal- Pudding Teaspoon -- Pharyngeal -- Pharyngeal- Pudding Cup -- Pharyngeal -- Pharyngeal- Honey Teaspoon -- Pharyngeal -- Pharyngeal- Honey Cup -- Pharyngeal -- Pharyngeal- Nectar Teaspoon -- Pharyngeal -- Pharyngeal- Nectar Cup Delayed swallow initiation-pyriform sinuses;Reduced pharyngeal peristalsis;Reduced anterior laryngeal mobility;Reduced airway/laryngeal closure;Reduced tongue base retraction;Pharyngeal residue - pyriform Pharyngeal -- Pharyngeal- Nectar Straw Delayed swallow initiation-pyriform sinuses;Reduced pharyngeal peristalsis;Reduced anterior laryngeal mobility;Reduced airway/laryngeal closure;Reduced tongue base retraction;Pharyngeal residue - pyriform;Penetration/Aspiration before swallow Pharyngeal Material enters airway, passes BELOW cords without attempt by patient to eject out (silent aspiration) Pharyngeal- Thin Teaspoon -- Pharyngeal -- Pharyngeal- Thin Cup Delayed swallow initiation-pyriform  sinuses;Reduced pharyngeal peristalsis;Reduced anterior laryngeal mobility;Reduced airway/laryngeal closure;Reduced tongue base retraction;Pharyngeal residue - pyriform;Penetration/Aspiration before swallow Pharyngeal Material enters airway, passes BELOW cords without attempt by patient to eject out (silent aspiration) Pharyngeal- Thin Straw Delayed swallow initiation-pyriform sinuses;Reduced pharyngeal peristalsis;Reduced anterior laryngeal mobility;Reduced airway/laryngeal closure;Reduced tongue base retraction;Pharyngeal residue - pyriform;Penetration/Aspiration before swallow Pharyngeal Material enters airway, passes BELOW cords without attempt by patient to eject out (silent aspiration) Pharyngeal- Puree Delayed swallow initiation-pyriform sinuses;Reduced pharyngeal peristalsis;Reduced anterior laryngeal mobility;Reduced airway/laryngeal closure;Reduced tongue base retraction;Pharyngeal residue - pyriform Pharyngeal -- Pharyngeal- Mechanical Soft Delayed swallow initiation-pyriform sinuses;Reduced pharyngeal peristalsis;Reduced anterior laryngeal mobility;Reduced airway/laryngeal closure;Reduced tongue base retraction;Pharyngeal residue - pyriform Pharyngeal -- Pharyngeal- Regular -- Pharyngeal -- Pharyngeal- Multi-consistency -- Pharyngeal -- Pharyngeal- Pill -- Pharyngeal -- Pharyngeal Comment --  CHL IP CERVICAL ESOPHAGEAL PHASE 07/21/2019 Cervical Esophageal Phase WFL Pudding Teaspoon -- Pudding Cup -- Honey Teaspoon -- Honey Cup -- Nectar Teaspoon -- Nectar Cup -- Nectar Straw -- Thin Teaspoon -- Thin Cup -- Thin Straw -- Puree -- Mechanical Soft -- Regular -- Multi-consistency -- Pill -- Cervical Esophageal Comment -- Mahala Menghini., M.A. CCC-SLP Acute Rehabilitation Services Pager [phone] Office [phone] 07/21/2019, 2:17 PM               ECHOCARDIOGRAM COMPLETE   Result Date: 07/21/2019    ECHOCARDIOGRAM REPORT   Patient Name:   LARZ MARK Date of Exam: 07/21/2019 Medical Rec #:  [id number]       Height:       63.0 in Accession #:    [id number]     Weight:       186.3 lb Date of Birth:  [birth year]      BSA:          1.876 m Patient Age:    64 years       BP:           80/62 mmHg Patient Gender: M              HR:           62 bpm. Exam Location:  Inpatient Procedure: 2D Echo, Cardiac Doppler and Color Doppler Indications:    Stroke 434.91 / I163.9  History:        Patient has prior history of Echocardiogram examinations,  most                 recent 07/09/2018. CAD, Stroke, Arrythmias:Cardiac Arrest, Atrial                 Fibrillation and non-specific ST changes; Risk                 Factors:Hypertension, Dyslipidemia and Non-Smoker. PAD.  Sonographer:    Renella Cunas RDCS Referring Phys: [id number] SHARON L BIBY  Sonographer Comments: Image acquisition challenging due to respiratory motion. IMPRESSIONS  1. Left ventricular ejection fraction, by estimation, is 55 to 60%. The left ventricle has normal function. The left ventricle has no regional wall motion abnormalities. Left ventricular diastolic parameters were normal.  2. Right ventricular systolic function is normal. The right ventricular size is normal. There is normal pulmonary artery systolic pressure.  3. The mitral valve is normal in structure. Trivial mitral valve regurgitation. No evidence of mitral stenosis.  4. The aortic valve is normal in structure. Aortic valve regurgitation is not visualized. No aortic stenosis is present.  5. The inferior vena cava is normal in size with greater than 50% respiratory variability, suggesting right atrial pressure of 3 mmHg. FINDINGS  Left Ventricle: Left ventricular ejection fraction, by estimation, is 55 to 60%. The left ventricle has normal function. The left ventricle has no regional wall motion abnormalities. The left ventricular internal cavity size was normal in size. There is  no left ventricular hypertrophy. Left ventricular diastolic parameters were normal. Right Ventricle: The right ventricular size is  normal. No increase in right ventricular wall thickness. Right ventricular systolic function is normal. There is normal pulmonary artery systolic pressure. The tricuspid regurgitant velocity is 1.73 m/s, and  with an assumed right atrial pressure of 3 mmHg, the estimated right ventricular systolic pressure is 15.0 mmHg. Left Atrium: Left atrial size was normal in size. Right Atrium: Right atrial size was normal in size. Pericardium: There is no evidence of pericardial effusion. Mitral Valve: The mitral valve is normal in structure. There is mild thickening of the mitral valve leaflet(s). There is mild calcification of the mitral valve leaflet(s). Normal mobility of the mitral valve leaflets. Trivial mitral valve regurgitation. No evidence of mitral valve stenosis. Tricuspid Valve: The tricuspid valve is normal in structure. Tricuspid valve regurgitation is trivial. No evidence of tricuspid stenosis. Aortic Valve: The aortic valve is normal in structure. Aortic valve regurgitation is not visualized. No aortic stenosis is present. Pulmonic Valve: The pulmonic valve was normal in structure. Pulmonic valve regurgitation is not visualized. No evidence of pulmonic stenosis. Aorta: The aortic root is normal in size and structure. Venous: The inferior vena cava is normal in size with greater than 50% respiratory variability, suggesting right atrial pressure of 3 mmHg. IAS/Shunts: No atrial level shunt detected by color flow Doppler.  LEFT VENTRICLE PLAX 2D LVIDd:         4.80 cm     Diastology LVIDs:         3.40 cm     LV e' lateral:   11.50 cm/s LV PW:         0.90 cm     LV E/e' lateral: 4.7 LV IVS:        0.90 cm     LV e' medial:    8.27 cm/s LVOT diam:     2.20 cm     LV E/e' medial:  6.5 LV SV:         62  LV SV Index:   33 LVOT Area:     3.80 cm  LV Volumes (MOD) LV vol d, MOD A2C: 96.0 ml LV vol d, MOD A4C: 90.7 ml LV vol s, MOD A2C: 48.1 ml LV vol s, MOD A4C: 38.3 ml LV SV MOD A2C:     47.9 ml LV SV MOD A4C:      90.7 ml LV SV MOD BP:      50.6 ml RIGHT VENTRICLE RV S prime:     10.10 cm/s TAPSE (M-mode): 1.2 cm LEFT ATRIUM             Index       RIGHT ATRIUM           Index LA diam:        3.60 cm 1.92 cm/m  RA Area:     11.00 cm LA Vol (A2C):   34.2 ml 18.23 ml/m RA Volume:   22.40 ml  11.94 ml/m LA Vol (A4C):   27.0 ml 14.39 ml/m LA Biplane Vol: 31.4 ml 16.74 ml/m  AORTIC VALVE LVOT Vmax:   79.70 cm/s LVOT Vmean:  55.600 cm/s LVOT VTI:    0.162 m  AORTA Ao Root diam: 3.50 cm MITRAL VALVE               TRICUSPID VALVE MV Area (PHT): 4.49 cm    TR Peak grad:   12.0 mmHg MV Decel Time: 169 msec    TR Vmax:        173.00 cm/s MV E velocity: 53.60 cm/s MV A velocity: 31.20 cm/s  SHUNTS MV E/A ratio:  1.72        Systemic VTI:  0.16 m                            Systemic Diam: 2.20 cm Charlton Haws MD Electronically signed by Charlton Haws MD Signature Date/Time: 07/21/2019/11:23:56 AM    Final     VAS US CAROTID   Result Date: 07/22/2019 Carotid Arterial Duplex Study Indications:       CVA. Risk Factors:      Hypertension, hyperlipidemia. Comparison Study:  No prior studies. Performing Technologist: Chanda Busing RVT  Examination Guidelines: A complete evaluation includes B-mode imaging, spectral Doppler, color Doppler, and power Doppler as needed of all accessible portions of each vessel. Bilateral testing is considered an integral part of a complete examination. Limited examinations for reoccurring indications may be performed as noted.  Right Carotid Findings: +----------+--------+--------+--------+-----------------------+--------+           PSV cm/sEDV cm/sStenosisPlaque Description     Comments +----------+--------+--------+--------+-----------------------+--------+ CCA Prox  72      21              smooth and heterogenoustortuous +----------+--------+--------+--------+-----------------------+--------+ CCA Distal66      18                                               +----------+--------+--------+--------+-----------------------+--------+ ICA Prox  110     34              calcific                        +----------+--------+--------+--------+-----------------------+--------+ ICA Distal83      33  tortuous +----------+--------+--------+--------+-----------------------+--------+ ECA       50      8                                               +----------+--------+--------+--------+-----------------------+--------+ +----------+--------+-------+--------+-------------------+           PSV cm/sEDV cmsDescribeArm Pressure (mmHG) +----------+--------+-------+--------+-------------------+ Subclavian119                                        +----------+--------+-------+--------+-------------------+ +---------+--------+--+--------+--+---------+ VertebralPSV cm/s39EDV cm/s11Antegrade +---------+--------+--+--------+--+---------+  Left Carotid Findings: +----------+--------+--------+--------+-----------------------+--------+           PSV cm/sEDV cm/sStenosisPlaque Description     Comments +----------+--------+--------+--------+-----------------------+--------+ CCA Prox  86      21              smooth and heterogenous         +----------+--------+--------+--------+-----------------------+--------+ CCA Distal67      18              calcific                        +----------+--------+--------+--------+-----------------------+--------+ ICA Prox  92      27              calcific               tortuous +----------+--------+--------+--------+-----------------------+--------+ ICA Distal97      31                                     tortuous +----------+--------+--------+--------+-----------------------+--------+ ECA       65      9                                               +----------+--------+--------+--------+-----------------------+--------+  +----------+--------+--------+--------+-------------------+           PSV cm/sEDV cm/sDescribeArm Pressure (mmHG) +----------+--------+--------+--------+-------------------+ [id number]                                          +----------+--------+--------+--------+-------------------+ +---------+--------+--+--------+--+---------+ VertebralPSV cm/s72EDV cm/s20Antegrade +---------+--------+--+--------+--+---------+   Summary: Right Carotid: Velocities in the right ICA are consistent with a 1-39% stenosis. Left Carotid: Velocities in the left ICA are consistent with a 1-39% stenosis. Vertebrals: Bilateral vertebral arteries demonstrate antegrade flow. *See table(s) above for measurements and observations.  Electronically signed by Delia Heady MD on 07/22/2019 at 1:55:38 PM.    Final              Medical Problem List and Plan: 1.  Left side weakness/dysarthria secondary to right thalamocapsular hemorrhage most likely related to hypertensive crisis as well as old left temporal parietal infarction with small vessel disease             -patient may shower             -ELOS/Goals: 12-18 days, supervision goals 2.  Antithrombotics: -DVT/anticoagulation:  SCD             -  antiplatelet therapy: N/A 3. Pain Management: Tylenol as needed 4. Mood: Zoloft 25 mg daily             -antipsychotic agents: Geodon 40 mg nightly             -seems to be in good spirits, sleeping well. Continue to monitor 5. Neuropsych: This patient is not capable of making decisions on his own behalf. 6. Skin/Wound Care: Routine skin checks 7. Fluids/Electrolytes/Nutrition: has reasonable appetite.              I personally reviewed the patient's labs today.                           -slight hyponatremia --observe                         -low albumin: protein supp                         -push fluids 8.  Dysphagia.  Dysphagia #3 nectar thick liquids.  Advance per speech therapy 9.  History of seizure disorder.   Vimpat 200 mg twice daily, Keppra 1500 mg twice daily, Depakote 500 mg twice daily.  10.  Hypertension.  Coreg 3.125 mg twice daily.  Monitor with increased mobility 11.  PAF.  Cardiac rate controlled, regular currently             -No anticoagulation due to history of left MCA infarction with hemorrhagic conversion 12.  Hyperlipidemia.  Resume Lipitor at discharge 13.  Obesity.  BMI 33.  Dietary follow-up 14.  CAD with CABG 2019.  No chest pain or shortness of breath. 15.  Mild thrombocytopenia: 161, follow up cbc while here     Charlton Amor, PA-C 07/23/2019  I have personally performed a face to face diagnostic evaluation of this patient and formulated the key components of the plan.  Additionally, I have personally reviewed laboratory data, imaging studies, as well as relevant notes and concur with the physician assistant's documentation above.  The patient's status has not changed from the original H&P.  Any changes in documentation from the acute care chart have been noted above.  Ranelle Oyster, MD, [place]

## 2019-07-27 NOTE — Evaluation (Addendum)
Physical Therapy Assessment and Plan  Patient Details  Name: [name] Cruz MRN: [id number] Date of Birth: 1955-05-31  PT Diagnosis: Abnormal posture, Abnormality of gait, Cognitive deficits, Coordination disorder, Difficulty walking, Hemiparesis dominant, Impaired cognition and Muscle weakness Rehab Potential: Fair ELOS: 3-4 weeks   Today's Date: 07/27/2019 PT Individual Time: 1962-2297 PT Individual Time Calculation (min): 55 min    Hospital Problem: Active Problems:   Thalamic hemorrhage [facility])   Past Medical History:  Past Medical History:  Diagnosis Date   Acute deep vein thrombosis (DVT) of left upper extremity (HCC)    L brachial and basilic veins, eliquis started 08/22/2017 to continue for 3 months total    Cognitive impairment 2007   after stroke, saw rehab but told to stop because was too upsetting to him   History of chicken pox    HLD (hyperlipidemia)    HTN (hypertension)    NSTEMI (non-ST elevated myocardial infarction) ([place]) 02/21/2017   Obesity    Stroke, hemorrhagic ([place]) 2007   thought 2/2 HTN (240sbp); residual cognitive impairment, loss of R peripheral field, no driving   Past Surgical History:  Past Surgical History:  Procedure Laterality Date   ANKLE SURGERY  1990s   right foot with plate and screws   CORONARY ARTERY BYPASS GRAFT N/A 02/24/2017   3v Procedure: CORONARY ARTERY BYPASS GRAFTING (CABG) x 3 ON PUMP USING LEFT INTERNAL MAMMARY ARTERY TO LEFT ANTERIOR DESENDING CORNARY ARTERY, RIGHT GREATER SAPHENOUS VEIN TO LEFT CIRCUMFLEX ARTERY AND POSTERIOR DESENDING ARTERY. RIGHT GREATER SAPHENOUS VEIN OBTAINED VIA ENDOVEIN HARVEST.;  Surgeon: Grace Isaac, MD   IABP INSERTION N/A 02/21/2017   Procedure: IABP Insertion;  Surgeon: Nelva Bush, MD;  Location: [facility];  Service: Cardiovascular;  Laterality: N/A;   LEFT HEART CATH AND CORONARY ANGIOGRAPHY N/A 02/21/2017   Procedure: LEFT HEART CATH AND CORONARY ANGIOGRAPHY;  Surgeon:  Nelva Bush, MD;  Location: [facility];  Service: Cardiovascular;  Laterality: N/A;   TEE WITHOUT CARDIOVERSION N/A 02/24/2017   Procedure: TRANSESOPHAGEAL ECHOCARDIOGRAM (TEE);  Surgeon: Grace Isaac, MD;  Location: [facility];  Service: Open Heart Surgery;  Laterality: N/A;    Assessment & Plan Clinical Impression: Patient is a 64 y.o.right handedmalehyperlipidemia,seizure disorder, bipolar disorder maintained on Geodon, CAD with CABG 2019, hypertension with history of deep vein thrombosis previously on Eliquis discontinued due to hemorrhagic CVA 2007. Presented 07/19/2019 with left-sided weakness and slurred speech. Noted systolic blood pressure 989Q. Cranial CT scan showed a 1.4 x 2.0 x 2.6 cm acute parenchymal hemorrhage centered within the right thalamocapsularjunction. Redemonstrated a chronic left temporoparietal infarction. MRI pending. Alcohol level negative, Sodium 132, urine drug screen negative. Cleviprex added for blood pressure control. Patient remains on Vimpat as well as Depakote as prior to admission. Dysphagia #3 nectar thick liquid diet. Therapy evaluation completed with recommendations of physical medicine rehab consult.  Patient transferred to CIR on 07/26/2019 .   Patient currently requires max +1-2 with mobility secondary to muscle weakness, decreased cardiorespiratoy endurance, motor apraxia and decreased coordination, decreased visual perceptual skills, decreased midline orientation and decreased attention to left, decreased attention, decreased awareness, decreased problem solving, decreased safety awareness, decreased memory and delayed processing, and decreased sitting balance, decreased standing balance, decreased postural control, decreased balance strategies and L hemiparesis.  Prior to hospitalization, patient was independent  with mobility and lived with  (sister) in a Mobile home home.  Home access is 3Stairs to enter.  Patient will benefit from  skilled PT  intervention to maximize safe functional mobility, minimize fall risk and decrease caregiver burden for planned discharge home with 24 hour assist.  Anticipate patient will benefit from follow up [facility] at discharge.  PT - End of Session Activity Tolerance: Tolerates 30+ min activity with multiple rests Endurance Deficit: Yes Endurance Deficit Description: 2/2 generalized deconditioning PT Assessment Rehab Potential (ACUTE/IP ONLY): Fair PT Barriers to Discharge: Home environment access/layout;Decreased caregiver support;Incontinence;Lack of/limited family support PT Barriers to Discharge Comments: unsure if family can provide 24 hr physical assist at d/c, steps to enter home PT Patient demonstrates impairments in the following area(s): Balance;Perception;Safety;Behavior;Edema;Endurance;Motor;Sensory PT Transfers Functional Problem(s): Bed Mobility;Bed to Chair;Car;Furniture PT Locomotion Functional Problem(s): Stairs;Wheelchair Mobility;Ambulation PT Plan PT Intensity: Minimum of 1-2 x/day ,45 to 90 minutes PT Frequency: 5 out of 7 days PT Duration Estimated Length of Stay: 3-4 weeks PT Treatment/Interventions: Ambulation/gait training;DME/adaptive equipment instruction;Neuromuscular re-education;Psychosocial support;Stair training;UE/LE Strength taining/ROM;Wheelchair propulsion/positioning;UE/LE Coordination activities;Therapeutic Activities;Skin care/wound management;Pain management;Functional electrical stimulation;Balance/vestibular training;Discharge planning;Cognitive remediation/compensation;Disease management/prevention;Functional mobility training;Patient/family education;Splinting/orthotics;Visual/perceptual remediation/compensation;Therapeutic Exercise PT Transfers Anticipated Outcome(s): min assist with LRAD PT Locomotion Anticipated Outcome(s): min assist short distance gait with LRAD, min assist w/c mobility PT Recommendation Recommendations for Other Services: Speech  consult Follow Up Recommendations: Home health PT;24 hour supervision/assistance Patient destination: Home Equipment Details: TBD, per chart pt already has RW & w/c  Skilled Therapeutic Intervention Patient received in bed & agreeable to tx. Educated pt on ELOS, daily therapy schedule, weekly team meetings & other CIR information. Provided pt with w/c cushion & half lap tray for increased positioning & to promote OOB tolerance (pt will benefit from hemi height w/c, plan to get one this week). Pt is able to roll L with min assist, but otherwise requires max assist for bed mobility with hospital bed features and therapist providing max cuing for compensatory strategy. Pt is able to briefly maintain static sitting balance EOB (<10 seconds) with very close supervision before experiencing LOB to L and impaired awareness of this, requiring max assist to return to sitting & min assist to maintain. Pt transfers sit<>stand with max assist, attempted stand pivot but pt unable to progress past standing therefore utilized stedy +2 assist to transfer to w/c. Pt demonstrates impaired ability to shift pelvis anteriorly in stedy and significantly pushes L with RUE/RLE while sitting in stedy despite therapist's multiple, various attempts for midline orientation, even with use of mirror in room. At end of session pt left in w/c with chair alarm donned & call bell in reach, reviewed use of call bell with pt return demonstrating.   PT Evaluation Precautions/Restrictions Precautions Precautions: Fall Precaution Comments: L hemi, L inattention, pusher Restrictions Weight Bearing Restrictions: No  General Chart Reviewed: Yes Additional Pertinent History: HLD, seizures, bipolar disorder, CAD with CABG 2019, HTN, Hemorrhagic CVA 2007, NTSTEMI 2019, obesity Response to Previous Treatment: Patient with no complaints from previous session. Family/Caregiver Present: No  Pain No c/o or behaviors demonstrating pain during  session.  Home Living/Prior Functioning Home Living Available Help at Discharge: Family;Available 24 hours/day Type of Home: Mobile home Home Access: Stairs to enter Entrance Stairs-Number of Steps: 3 Entrance Stairs-Rails: Right;Left Home Layout: One level Lives With:  (sister) Prior Function Level of Independence: Independent with basic ADLs;Independent with gait (mowing) Comments: enjoys mowing grass and doing yardwork; sister helps wiht medicaiton management; sister pays bills; does not drive  Vision/Perception  Wears glasses at baseline. Decreased attention to L side of environment. Apraxic.    Cognition Overall Cognitive Status: Impaired/Different from baseline Arousal/Alertness:  Awake/alert Orientation Level: Oriented to person;Oriented to place;Oriented to situation Memory: Impaired Memory Impairment: Decreased short term memory;Decreased recall of new information Awareness: Impaired Awareness Impairment: Intellectual impairment Problem Solving: Impaired Problem Solving Impairment: Functional basic;Verbal basic Safety/Judgment: Impaired  Sensation Sensation Light Touch: Not tested Proprioception: Not tested Coordination Gross Motor Movements are Fluid and Coordinated: No Fine Motor Movements are Fluid and Coordinated: No Coordination and Movement Description: no active movement noted in LUE, impaired LLE Heel Shin Test: unable to attempt with LLE 2/2 weakness/impaired neuromuscular control  Motor  Motor Motor: Abnormal postural alignment and control (L hemiparesis (UE>LE)) Motor - Skilled Clinical Observations: generalized weakness   Mobility Bed Mobility Bed Mobility: Rolling Right;Rolling Left;Left Sidelying to Sit;Sit to Supine Rolling Right: Maximal Assistance - Patient 25-49% Rolling Left: Minimal Assistance - Patient > 75% Left Sidelying to Sit: Maximal Assistance - Patient 25-49% Supine to Sit: (P) Total Assistance - Patient < 25% Sit to Supine:  Maximal Assistance - Patient 25-49% Transfers Transfers: Sit to Stand;Stand to Sit Sit to Stand: Maximal Assistance - Patient 25-49% Stand to Sit: Maximal Assistance - Patient 25-49% Transfer (Assistive device):  (stedy)  Locomotion  Gait Ambulation: No Gait Gait: No Stairs / Additional Locomotion Stairs: No Wheelchair Mobility Wheelchair Mobility: No   Trunk/Postural Assessment  Cervical Assessment Cervical Assessment: Exceptions to [facility] (forward head) Thoracic Assessment Thoracic Assessment: Exceptions to [facility] (rounded shoulders) Lumbar Assessment Lumbar Assessment: Exceptions to [facility] (posterior pelvic tilt) Postural Control Postural Control: Deficits on evaluation Trunk Control: LOB to L Righting Reactions: impaired Protective Responses: impaired   Balance Balance Balance Assessed: Yes Static Sitting Balance Static Sitting - Balance Support: Feet supported;Right upper extremity supported Static Sitting - Level of Assistance: 4: Min assist  Extremity Assessment  RUE Assessment RUE Assessment: Within Functional Limits LUE Assessment LUE Assessment: Exceptions to [facility] Strength Comments: no active movement noted RLE Assessment RLE Assessment: Within Functional Limits LLE Assessment LLE Assessment: Exceptions to [facility] General Strength Comments: 2-/5 hip flexion noted in supine    Refer to Care Plan for Long Term Goals  Recommendations for other services: None   Discharge Criteria: Patient will be discharged from PT if patient refuses treatment 3 consecutive times without medical reason, if treatment goals not met, if there is a change in medical status, if patient makes no progress towards goals or if patient is discharged from hospital.  The above assessment, treatment plan, treatment alternatives and goals were discussed and mutually agreed upon: by patient  Waunita Schooner 07/27/2019, 12:53 PM

## 2019-07-27 NOTE — Discharge Instructions (Signed)
Inpatient Rehab Discharge Instructions  [name] Cruz Discharge date and time: No discharge date for patient encounter.   Activities/Precautions/ Functional Status: Activity: activity as tolerated Diet:  Wound Care: none needed Functional status:  ___ No restrictions     ___ Walk up steps independently ___ 24/7 supervision/assistance   ___ Walk up steps with assistance ___ Intermittent supervision/assistance  ___ Bathe/dress independently ___ Walk with walker     _x__ Bathe/dress with assistance ___ Walk Independently    ___ Shower independently ___ Walk with assistance    ___ Shower with assistance ___ No alcohol     ___ Return to work/school ________  Special Instructions: No driving smoking or alcohol   My questions have been answered and I understand these instructions. I will adhere to these goals and the provided educational materials after my discharge from the hospital.  Patient/Caregiver Signature _______________________________ Date __________  Clinician Signature _______________________________________ Date __________  Please bring this form and your medication list with you to all your follow-up doctor's appointments.

## 2019-07-27 NOTE — Progress Notes (Signed)
Patient ID: IZEYAH DEIKE, male   DOB: 08-03-1955, 64 y.o.   MRN: [id number]  SW spoke with pt sister Steward Drone [phone]) to provide updates from team conference, and inform d/c date to be determined since pt was admitted on 7/12. She confirms that she and siblings provide care to him. She states she manages his medications/finances. She confirms DME: canes and RW.  Cecile Sheerer, MSW, LCSWA Office: [phone] Cell: [phone] Fax: [phone]

## 2019-07-27 NOTE — Evaluation (Signed)
Occupational Therapy Assessment and Plan  Patient Details  Name: [name] Cruz MRN: [id number] Date of Birth: 12/22/1955  OT Diagnosis: apraxia, ataxia, cognitive deficits and muscle weakness (generalized), hemiplegia affecting dominant side Rehab Potential: Rehab Potential (ACUTE ONLY): Good ELOS: 3-4 weeks   Today's Date: 07/27/2019 OT Individual Time: 1100-1200 OT Individual Time Calculation (min): 60 min     Hospital Problem: Active Problems:   Thalamic hemorrhage [facility])   Past Medical History:  Past Medical History:  Diagnosis Date  . Acute deep vein thrombosis (DVT) of left upper extremity (HCC)    L brachial and basilic veins, eliquis started 08/22/2017 to continue for 3 months total   . Cognitive impairment 2007   after stroke, saw rehab but told to stop because was too upsetting to him  . History of chicken pox   . HLD (hyperlipidemia)   . HTN (hypertension)   . NSTEMI (non-ST elevated myocardial infarction) ([place]) 02/21/2017  . Obesity   . Stroke, hemorrhagic ([place]) 2007   thought 2/2 HTN (240sbp); residual cognitive impairment, loss of R peripheral field, no driving   Past Surgical History:  Past Surgical History:  Procedure Laterality Date  . ANKLE SURGERY  1990s   right foot with plate and screws  . CORONARY ARTERY BYPASS GRAFT N/A 02/24/2017   3v Procedure: CORONARY ARTERY BYPASS GRAFTING (CABG) x 3 ON PUMP USING LEFT INTERNAL MAMMARY ARTERY TO LEFT ANTERIOR DESENDING CORNARY ARTERY, RIGHT GREATER SAPHENOUS VEIN TO LEFT CIRCUMFLEX ARTERY AND POSTERIOR DESENDING ARTERY. RIGHT GREATER SAPHENOUS VEIN OBTAINED VIA ENDOVEIN HARVEST.;  Surgeon: Grace Isaac, MD  . IABP INSERTION N/A 02/21/2017   Procedure: IABP Insertion;  Surgeon: Nelva Bush, MD;  Location: [place] [facility];  Service: Cardiovascular;  Laterality: N/A;  . LEFT HEART CATH AND CORONARY ANGIOGRAPHY N/A 02/21/2017   Procedure: LEFT HEART CATH AND CORONARY ANGIOGRAPHY;  Surgeon: Nelva Bush, MD;   Location: [place] [facility];  Service: Cardiovascular;  Laterality: N/A;  . TEE WITHOUT CARDIOVERSION N/A 02/24/2017   Procedure: TRANSESOPHAGEAL ECHOCARDIOGRAM (TEE);  Surgeon: Grace Isaac, MD;  Location: [facility];  Service: Open Heart Surgery;  Laterality: N/A;    Assessment & Plan Clinical Impression: Patient is a 64 y.o. year old male with past medical history of deep vein thrombosis previously on Eliquis, history of hemorrhagic stroke secondary to hypertension with blood pressure of 093 systolic, hypertension, hyperlipidemia presents to the emergency department with sudden onset left-sided weakness. NIHSS 11. CT - Acute hemorrhage R thalamocapsular junction; mild associated edema.  Patient transferred to CIR on 07/26/2019 .    Patient currently requires max with basic self-care skills secondary to muscle weakness, decreased cardiorespiratoy endurance, impaired timing and sequencing, abnormal tone, unbalanced muscle activation, motor apraxia, ataxia, decreased coordination and decreased motor planning, decreased midline orientation, decreased attention to left, left side neglect, decreased motor planning and ideational apraxia, decreased initiation, decreased attention, decreased awareness, decreased problem solving, decreased safety awareness, decreased memory and delayed processing and decreased sitting balance, decreased standing balance, decreased postural control, hemiplegia and decreased balance strategies.  Prior to hospitalization, patient could complete BADL with supervision.  Patient will benefit from skilled intervention to increase independence with basic self-care skills prior to discharge home with care partner.  Anticipate patient will require 24 hour supervision and minimal physical assistance and follow up home health.  OT - End of Session Endurance Deficit: Yes Endurance Deficit Description: 2/2 generalized deconditioning OT Assessment Rehab Potential (ACUTE ONLY): Good OT  Patient demonstrates impairments  in the following area(s): Balance;Cognition;Endurance;Motor;Perception;Safety;Sensory;Vision OT Basic ADL's Functional Problem(s): Eating;Grooming;Bathing;Dressing;Toileting OT Transfers Functional Problem(s): Toilet;Tub/Shower OT Additional Impairment(s): Fuctional Use of Upper Extremity OT Plan OT Intensity: Minimum of 1-2 x/day, 45 to 90 minutes OT Frequency: 5 out of 7 days OT Duration/Estimated Length of Stay: 3-4 weeks OT Treatment/Interventions: Balance/vestibular training;Cognitive remediation/compensation;Community reintegration;Discharge planning;DME/adaptive equipment instruction;Functional electrical stimulation;Functional mobility training;Neuromuscular re-education;Pain management;Patient/family education;Self Care/advanced ADL retraining;Splinting/orthotics;Therapeutic Activities;Therapeutic Exercise;UE/LE Strength taining/ROM;UE/LE Coordination activities;Visual/perceptual remediation/compensation;Wheelchair propulsion/positioning OT Self Feeding Anticipated Outcome(s): Supervision OT Basic Self-Care Anticipated Outcome(s): Min A OT Toileting Anticipated Outcome(s): Min A OT Bathroom Transfers Anticipated Outcome(s): Min A OT Recommendation Patient destination: Home Follow Up Recommendations: Home health OT Equipment Recommended: To be determined   Skilled Therapeutic Intervention Initial eval completed with treatment provided to address functional transfers, functional use of L UE, improved sit<>stand, standing tolerance, and adapted bathing/dressing skills. Pt greeted already sitting up in wc. Bathing/dressing completed at the sink. Pt handed wash cloth and was able to bring cloth to face, but then perseverated on washing face and needed hand over hand A to move onto next body part. Pt very apraxic throughout BADL tasks when using R hand and needed hand over hand A to know what to do with objects. Incorporated L NMR weight bearing techniques  within bathing tasks with hand over hand A to grasp wash cloth 2/2 flaccid L hemiplegia. Stedy used for sit<>stands with +2 assist needed 2/2 increased pushing and LOB to the L in standing. +2 assistance to maintain standing while OT pulled up pants. Stedy used to transfer pt back to bed with +2 assist. OT placed pillow under L UE for support. Pt also perseverative during conversation and needed cues to move onto next topic. Pt left semi-reclined in bed with bed alarm on, call bell in reach, and needs met.   OT Evaluation Precautions/Restrictions  Precautions Precautions: Fall Precaution Comments: L hemi, L inattention, pusher Restrictions Weight Bearing Restrictions: No Pain  denies pain Home Living/Prior Functioning Home Living Family/patient expects to be discharged to:: Private residence Living Arrangements: Other relatives (sisters) Available Help at Discharge: Family, Available 24 hours/day Type of Home: Mobile home Home Access: Stairs to enter [employer] of Steps: 3 Entrance Stairs-Rails: Right, Left Home Layout: One level Bathroom Shower/Tub: Government social research officer Accessibility: Yes  Lives With:  (sister) IADL History IADL Comments: Pt reports he was independent with ADLs and he mowed the grass-will need to confirm with sister Prior Function Level of Independence: Independent with basic ADLs, Independent with gait (mowing) Comments: enjoys mowing grass and doing yardwork; sister helps wiht Best boy; sister pays bills; does not dirve ADL ADL Eating: Minimal assistance Grooming: Maximal assistance Upper Body Bathing: Maximal assistance Lower Body Bathing: Maximal assistance Upper Body Dressing: Moderate assistance Lower Body Dressing: Maximal assistance Toileting: Unable to assess Toilet Transfer: Maximal assistance (+2 using Stedy) Vision Baseline Vision/History: Wears glasses Wears Glasses: At all times Vision  Assessment?: Vision impaired- to be further tested in functional context Eye Alignment: Within Functional Limits Saccades: Additional head turns occurred during testing;Additional eye shifts occurred during testing Visual Fields: Impaired-to be further tested in functional context Depth Perception: Overshoots Praxis Praxis: Impaired Praxis Impairment Details: Ideation;Initiation;Motor planning;Perseveration Cognition Overall Cognitive Status: Impaired/Different from baseline Arousal/Alertness: Awake/alert Orientation Level: Person;Place;Situation Person: Oriented Place: Oriented Situation: Oriented Year: Other (Comment) (2002) Month: July (with catagorical cues) Day of Week: Incorrect Memory: Impaired Memory Impairment: Decreased short term memory;Decreased recall of new information Decreased Short Term Memory: Verbal basic;Functional basic  Immediate Memory Recall: Blue;Sock;Bed Memory Recall Sock: Not able to recall Memory Recall Blue: Not able to recall Memory Recall Bed: Not able to recall Attention: Sustained Sustained Attention: Appears intact Awareness: Impaired Awareness Impairment: Intellectual impairment Problem Solving: Impaired Problem Solving Impairment: Functional basic;Verbal basic Safety/Judgment: Impaired Sensation Sensation Light Touch: Not tested Light Touch Impaired Details: Impaired LUE;Impaired LLE Proprioception: Not tested Proprioception Impaired Details: Impaired LLE;Impaired LUE Coordination Gross Motor Movements are Fluid and Coordinated: No Fine Motor Movements are Fluid and Coordinated: No Coordination and Movement Description: no active movement noted in LUE, impaired LLE Finger Nose Finger Test: Unable to understand commands to completed Heel Shin Test: unable to attempt with LLE 2/2 weakness/impaired neuromuscular control Motor  Motor Motor: Abnormal postural alignment and control (L hemiparesis (UE>LE)) Motor - Skilled Clinical  Observations: generalized weakness Mobility  Bed Mobility Bed Mobility: Rolling Right;Rolling Left;Left Sidelying to Sit;Sit to Supine Rolling Right: Maximal Assistance - Patient 25-49% Rolling Left: Minimal Assistance - Patient > 75% Left Sidelying to Sit: Maximal Assistance - Patient 25-49% Supine to Sit: Total Assistance - Patient < 25% Sit to Supine: Maximal Assistance - Patient 25-49% Transfers Sit to Stand: Maximal Assistance - Patient 25-49% Stand to Sit: Maximal Assistance - Patient 25-49%  Trunk/Postural Assessment  Cervical Assessment Cervical Assessment: Exceptions to [facility] (forward head) Thoracic Assessment Thoracic Assessment: Exceptions to [facility] (rounded shoulders) Lumbar Assessment Lumbar Assessment: Exceptions to [facility] (posterior pelvic tilt) Postural Control Postural Control: Deficits on evaluation Trunk Control: LOB to L Righting Reactions: impaired Protective Responses: impaired  Balance Balance Balance Assessed: Yes Static Sitting Balance Static Sitting - Balance Support: Feet supported;Right upper extremity supported Static Sitting - Level of Assistance: 4: Min assist Dynamic Sitting Balance Dynamic Sitting - Balance Support: Feet supported Dynamic Sitting - Level of Assistance: 3: Mod assist Static Standing Balance Static Standing - Level of Assistance: 2: Max assist (+2) Dynamic Standing Balance Dynamic Standing - Balance Support: During functional activity Dynamic Standing - Level of Assistance: 1: +2 Total assist Extremity/Trunk Assessment RUE Assessment RUE Assessment: Within Functional Limits LUE Assessment LUE Assessment: Exceptions to [facility] Passive Range of Motion (PROM) Comments: WFL General Strength Comments: no active movement noted LUE Body System: Neuro Brunstrum levels for arm and hand: Arm;Hand Brunstrum level for arm: Stage I Presynergy Brunstrum level for hand: Stage I Flaccidity LUE Tone LUE Tone: Flaccid     Refer to Care Plan for  Long Term Goals  Recommendations for other services: None    Discharge Criteria: Patient will be discharged from OT if patient refuses treatment 3 consecutive times without medical reason, if treatment goals not met, if there is a change in medical status, if patient makes no progress towards goals or if patient is discharged from hospital.  The above assessment, treatment plan, treatment alternatives and goals were discussed and mutually agreed upon: by patient  Valma Cava 07/27/2019, 1:02 PM

## 2019-07-27 NOTE — Progress Notes (Signed)
Inpatient Rehabilitation Medication Review by a Pharmacist  A complete drug regimen review was completed for this patient to identify any potential clinically significant medication issues.  Clinically significant medication issues were identified:  yes   Type of Medication Issue Identified Description of Issue Urgent (address now) Non-Urgent (address on AM team rounds)    Drug Interaction(s) (clinically significant)       Duplicate Therapy       Allergy       No Medication Administration End Date       Incorrect Dose       Additional Drug Therapy Needed  Neurology recommended restarting atorvastatin 80mg  daily  Non-urgent    Other         Name of provider notified for urgent issues identified: Dr.  Provider Method of Notification: Secure chat  Time spent performing this drug regimen review (minutes):  10  Riley Kill, PharmD, BCPS, [facility] Clinical Pharmacist  Please check AMION for all [facility] Pharmacy phone numbers After 10:00 PM, call Main Pharmacy [phone]

## 2019-07-28 ENCOUNTER — Inpatient Hospital Stay (HOSPITAL_COMMUNITY): Payer: Medicare Other | Admitting: Physical Therapy

## 2019-07-28 ENCOUNTER — Other Ambulatory Visit: Payer: Self-pay

## 2019-07-28 ENCOUNTER — Encounter (HOSPITAL_COMMUNITY): Payer: Self-pay | Admitting: Physical Medicine & Rehabilitation

## 2019-07-28 ENCOUNTER — Inpatient Hospital Stay (HOSPITAL_COMMUNITY): Payer: Medicare Other | Admitting: Speech Pathology

## 2019-07-28 ENCOUNTER — Inpatient Hospital Stay (HOSPITAL_COMMUNITY): Payer: Medicare Other | Admitting: Occupational Therapy

## 2019-07-28 LAB — CULTURE, BLOOD (ROUTINE X 2)
Culture: NO GROWTH
Culture: NO GROWTH
Special Requests: ADEQUATE

## 2019-07-28 NOTE — Progress Notes (Addendum)
[place] PHYSICAL MEDICINE & REHABILITATION PROGRESS NOTE   Subjective/Complaints: Had a good night. Ready for more therapies today!. Motivated to get home  ROS: Patient denies fever, rash, sore throat, blurred vision, nausea, vomiting, diarrhea, cough, shortness of breath or chest pain, joint or back pain, headache, or mood change.    Objective:   No results found. Recent Labs    07/26/19 0432 07/27/19 0611  WBC 6.0 6.1  HGB 11.8* 12.2*  HCT 35.4* 36.4*  PLT 142* 161   Recent Labs    07/26/19 0432 07/27/19 0611  NA 136 134*  K 4.0 4.1  CL 100 98  CO2 28 27  GLUCOSE 96 94  BUN 11 13  CREATININE 0.69 0.71  CALCIUM 8.6* 8.7*    Intake/Output Summary (Last 24 hours) at 07/28/2019 0812 Last data filed at 07/28/2019 3532 Gross per 24 hour  Intake 236 ml  Output 800 ml  Net -564 ml     Physical Exam: Vital Signs Blood pressure 124/81, pulse 62, temperature 98.3 F (36.8 C), resp. rate 18, height 5\' 3"  (1.6 m), weight 78.3 kg, SpO2 100 %.  General: Alert and oriented x 3, No apparent distress HEENT: Head is normocephalic, atraumatic, PERRLA, EOMI, sclera anicteric, oral thrush, dentition intact, ext ear canals clear,  Neck: Supple without JVD or lymphadenopathy Heart: Reg rate and rhythm. No murmurs rubs or gallops Chest: CTA bilaterally without wheezes, rales, or rhonchi; no distress Abdomen: Soft, non-tender, non-distended, bowel sounds positive. Extremities: No clubbing, cyanosis, or edema. Pulses are 2+ Skin: Clean and intact without signs of breakdown, sternal incision  Neuro: Pt is cognitively appropriate with normal insight, memory, and awareness.   Sensory exam is normal. Reflexes are 2+ in all 4's. Fine motor coordination is intact. No tremors. Motor function is grossly 5/5 RUE/RLE. LUE 0/5 LLE 1+/5. Sensation tr-1/2 left side. Left central 7 Musculoskeletal: Full ROM, No pain with AROM or PROM in the neck, trunk, or extremities. Posture appropriate Psych:  Pt's affect is appropriate. Pt is cooperative      Assessment/Plan: 1. Functional deficits secondary to right thalamocapsular hemorrhage which require 3+ hours per day of interdisciplinary therapy in a comprehensive inpatient rehab setting.  Physiatrist is providing close team supervision and 24 hour management of active medical problems listed below.  Physiatrist and rehab team continue to assess barriers to discharge/monitor patient progress toward functional and medical goals  Care Tool:  Bathing    Body parts bathed by patient: Chest, Abdomen, Face, Right upper leg, Left arm   Body parts bathed by helper: Right arm, Front perineal area, Buttocks, Left upper leg, Right lower leg, Left lower leg     Bathing assist Assist Level: 2 Helpers     Upper Body Dressing/Undressing Upper body dressing   What is the patient wearing?: Pull over shirt    Upper body assist Assist Level: Maximal Assistance - Patient 25 - 49%    Lower Body Dressing/Undressing Lower body dressing      What is the patient wearing?: Pants, Incontinence brief     Lower body assist Assist for lower body dressing: Maximal Assistance - Patient 25 - 49%     Toileting Toileting Toileting Activity did not occur (Clothing management and hygiene only): N/A (no void or bm)  Toileting assist Assist for toileting: Dependent - Patient 0% (pt. needed in and out cath)     Transfers Chair/bed transfer  Transfers assist  Chair/bed transfer activity did not occur: Safety/medical concerns  Locomotion Ambulation   Ambulation assist   Ambulation activity did not occur: Safety/medical concerns          Walk 10 feet activity   Assist  Walk 10 feet activity did not occur: Safety/medical concerns        Walk 50 feet activity   Assist Walk 50 feet with 2 turns activity did not occur: Safety/medical concerns         Walk 150 feet activity   Assist Walk 150 feet activity did not  occur: Safety/medical concerns         Walk 10 feet on uneven surface  activity   Assist Walk 10 feet on uneven surfaces activity did not occur: Safety/medical concerns         Wheelchair     Assist Will patient use wheelchair at discharge?: Yes Type of Wheelchair: Manual Wheelchair activity did not occur: Safety/medical concerns         Wheelchair 50 feet with 2 turns activity    Assist    Wheelchair 50 feet with 2 turns activity did not occur: Safety/medical concerns       Wheelchair 150 feet activity     Assist  Wheelchair 150 feet activity did not occur: Safety/medical concerns       Blood pressure 124/81, pulse 62, temperature 98.3 F (36.8 C), resp. rate 18, height 5\' 3"  (1.6 m), weight 78.3 kg, SpO2 100 %.  Medical Problem List and Plan: 1.Left side weakness/dysarthriasecondary to rightthalamocapsularhemorrhage most likely related to hypertensive crisis as well as old left temporal parietal infarction with small vessel disease -patient may shower -ELOS/Goals: 3+ days, min assist goals  -WHO LUE, PRAFO 2. Antithrombotics: -DVT/anticoagulation:SCD -antiplatelet therapy: N/A 3. Pain Management:Tylenol as needed 4. Mood:Zoloft 25 mg daily -antipsychotic agents: Geodon 40 mg nightly -seems to be in good spirits, sleeping well. Continue to monitor 5. Neuropsych: This patientis notcapable of making decisions on hisown behalf. 6. Skin/Wound Care:Routine skin checks 7. Fluids/Electrolytes/Nutrition:. -encourage fluids/po as intake appears variable  -protein supp 8. Dysphagia. Dysphagia #3 nectar thick liquids. Advance perspeech therapy  -diflucan x 2 days for thrush on tongue 9. History of seizure disorder. Vimpat 200 mg twice daily, Keppra 1500 mg twice daily, Depakote 500 mg twice daily. 10. Hypertension. Coreg 3.125 mg twice daily. Monitor with  increased mobility 11. PAF. Cardiac rate controlled, regular currently -No anticoagulationdue to history of left MCA infarction with hemorrhagic conversion 12. Hyperlipidemia. Resumed Lipitor   13. Obesity. BMI 30. Dietary follow-up 14. CAD with CABG 2019. No chest pain or shortness of breath. 15. Mild thrombocytopenia: 161, follow up cbc while here    LOS: 2 days A FACE TO FACE EVALUATION WAS PERFORMED  2020 07/28/2019, 8:12 AM

## 2019-07-28 NOTE — Progress Notes (Signed)
Physical Therapy Session Note  Patient Details  Name: [name] Cruz MRN: [id number] Date of Birth: 21-Jan-1955  Today's Date: 07/28/2019 PT Individual Time: 1105-1200 PT Individual Time Calculation (min): 55 min   Short Term Goals: Week 1:  PT Short Term Goal 1 (Week 1): Pt will complete bed mobilty with hospital bed features & mod assist. PT Short Term Goal 2 (Week 1): Pt will complete bed<>w/c with LRAD & max assist +1. PT Short Term Goal 3 (Week 1): Pt will initiate gait training. PT Short Term Goal 4 (Week 1): Pt will demonstrate sitting balance x 5 minutes with supervision.  Skilled Therapeutic Interventions/Progress Updates:   Pt received supine in bed and agreeable to PT. Supine>sit transfer with max assist and moderate cues for sequencing and proper UE placement to push from mattress into sitting EOB. Reciprocal scooting with moderate assist from PT to allow BLE support on floor. Sitting balance EOB with min-supervision assist x 2 minutes.   Sit<>stand from EOB with mod assist and BLE blocked for improved proprioceptive feedback. Stand pivot transfer then performed with max assist due to heavy pushing to the L through the RLE/RUE.   Pt transported to orthogym. Sit>stand in standing frame 2 x 5 minutes. Pt performed lateral reach right to obtain horseshoe then instructed to perform forward reach to tap at target, then visual scanning to the L to hand to PT. Poor ability to follow 2 step commands and difficulty performing visual scan to the L. Max assist to return to erect posture following forward reach. Max cues also provided to decrease extension response in the RLE when exertion.   Sit<>stand in parallel bars with 1 UE support on the Rail and max assist. Standing balance in parallel bars to perform lateral weight shift Rand L 2x10 Bil. Mod-max assist throughout to improve erect midline posture.    Patient returned to room and left sitting in [facility] with call bell in reach and all needs  met.         Therapy Documentation Precautions:  Precautions Precautions: Fall Precaution Comments: L hemi, L inattention, pusher Restrictions Weight Bearing Restrictions: No Vital Signs: Therapy Vitals Pulse Rate: 82 Resp: 17 BP: 126/88 Patient Position (if appropriate): Sitting Oxygen Therapy SpO2: 98 % O2 Device: Room Air Pain: Pain Assessment Pain Scale: 0-10 Pain Score: 3  Pain Type: Acute pain Pain Location: Foot Pain Orientation: Left Pain Descriptors / Indicators: Aching Pain Frequency: Occasional Pain Onset: Gradual Patients Stated Pain Goal: 2 Pain Intervention(s): Medication (See eMAR) Multiple Pain Sites: No    Therapy/Group: Individual Therapy  Lorie Phenix 07/28/2019, 3:28 PM

## 2019-07-28 NOTE — Progress Notes (Signed)
Occupational Therapy Session Note  Patient Details  Name: [name] Cruz MRN: [id number] Date of Birth: 1955-04-25  Today's Date: 07/28/2019 OT Individual Time: 1315-1400 OT Individual Time Calculation (min): 45 min    Short Term Goals: Week 1:  OT Short Term Goal 1 (Week 1): Pt will complete toilet transfer with max A of 1 OT Short Term Goal 2 (Week 1): Pt will maintain sitting balance at EOB with no more than min A within BADL tasks OT Short Term Goal 3 (Week 1): Pt will wash upper body with no more than mod verbal cues to move onto next body part  Skilled Therapeutic Interventions/Progress Updates:    1:1 NMR: Pt received in the w/c. Performed slide board transfer to the mat with max A - challenged by decr motor planning and apraxia. On the EOB required min to max A for sitting balance both for static and dynamic sitting balance. Pt with difficulty following one step commands at times requiring mod A. Performed sit to stand with total A +2 for safety. PT able to power up in to standing and required mod to max A to maintain standing balance with +2 for safety and multimodal cues. Pt performed reaching task with cards on upright surface (back of standing mirror) to promote weight shift to the right and activation in left Le (extension at the hip and knee). Pt required extra time to follow through with all tasks.   Got into supine and then sidelying on his right side with max A. Continued to assess and provided opportunities for movement in occur in left UE. No movement detected in shoulder - but also difficulty with activation due to apraxia.   Return to sitting with max A. W/c switched out for hemi height chair. Transfer training with focus on pivoting and bottom clearance with right hand stabilized and maintained a flat hand. Pt able to complete transfer with mod A with more than reasonable amt of time/ extra time.  Left sitting up in w/c with half lap tray and safety belt.   Therapy  Documentation Precautions:  Precautions Precautions: Fall Precaution Comments: L hemi, L inattention, pusher Restrictions Weight Bearing Restrictions: No Pain: No c/o pain   Therapy/Group: Individual Therapy  Roney Mans [facility] 07/28/2019, 7:03 PM

## 2019-07-28 NOTE — Progress Notes (Signed)
Physical Therapy Session Note  Patient Details  Name: [name] Cruz MRN: [id number] Date of Birth: [birth year]  Today's Date: 07/28/2019 PT Individual Time: 9323-5573 PT Individual Time Calculation (min): 54 min   Short Term Goals: Week 1:  PT Short Term Goal 1 (Week 1): Pt will complete bed mobilty with hospital bed features & mod assist. PT Short Term Goal 2 (Week 1): Pt will complete bed<>w/c with LRAD & max assist +1. PT Short Term Goal 3 (Week 1): Pt will initiate gait training. PT Short Term Goal 4 (Week 1): Pt will demonstrate sitting balance x 5 minutes with supervision.  Skilled Therapeutic Interventions/Progress Updates:  Pt received in w/c & agreeable to tx. Sit<>stand in stedy with +2 assist with pt pushing heavily with RUE/RLE, even unable to fully sit with B knees resting against stedy plate as pt continues to push. Pt also unable to achieve full upright standing 2/2 posterior pelvis with inability to come forward even with PT attempting to manually facilitate. In gym, pt sitting in stedy with attempts to use mirror for visual feedback but this does not help pt. Pt continues to push L and requires max multimodal cuing to attempt to come to midline, with therapist using visual aides to have pt shift to R with pt still requiring max manual facilitation. While sitting on EOM, provided pt with visual target to prop onto elbow with pt requiring max multimodal cuing to assist with pt performing R elbow push ups to attempt to reorient to midline & correct pushing. Sit<>stand x 2 with +2 assist with pt unable to shift pelvis anteriorly for full upright posture; progressed to attempt to take a couple steps with pt able to slightly slide BLE forward before abruptly sitting. Cybex kinetron from w/c level with max multimodal cuing & therapist facilitating movement then pt progressing to performing task on his own with activity focusing on BLE strengthening & NMR. W/c propulsion in controlled hallway  with max assist for steering as pt only propels with RUE with difficulty using RLE to assist with steering. At end of session pt left in w/c with chair alarm donned & call bell in reach.  Therapy Documentation Precautions:  Precautions Precautions: Fall Precaution Comments: L hemi, L inattention, pusher Restrictions Weight Bearing Restrictions: No  Pain: C/o unrated pain in 4th digit of L hand (reports pain happened after fall at home) - pt easily distracted from pain during session  Therapy/Group: Individual Therapy  TAYJON HALLADAY 07/28/2019, 4:05 PM

## 2019-07-28 NOTE — Progress Notes (Signed)
Speech Language Pathology Daily Session Note  Patient Details  Name: [name] Cruz MRN: [id number] Date of Birth: [birth year]  Today's Date: 07/28/2019 SLP Individual Time: 7741-2878 SLP Individual Time Calculation (min): 55 min  Short Term Goals: Week 1: SLP Short Term Goal 1 (Week 1): Patient will utilize speech intelligibility strategies at the sentence level to achieve ~90% intelligibility with supervision verbal cues. SLP Short Term Goal 2 (Week 1): Patient will consume current diet with minimal overt s/s of aspiration with overall supervision verbal cues for use of swallowing compensatory strategies. SLP Short Term Goal 3 (Week 1): Patient will consume trials of thin liquids with minimal overt s/s of aspiration over 2 sessions to assess readiness for repeat MBS. SLP Short Term Goal 4 (Week 1): Patient will demonstrate functional problem solving for basic and familiar tasks with Min A verbal cues. SLP Short Term Goal 5 (Week 1): Patient will attend to left field of enviornment during functional tasks with Min A verbal cues. SLP Short Term Goal 6 (Week 1): Patient will recall new, daily information with Min A verbal cues.  Skilled Therapeutic Interventions:  Skilled treatment session focused on cognitive goals. SLP facilitated session by administering the MoCA-BASIC. Patient scored 4/30 points and demonstrated deficits in orientation, attention, reasoning, short-term recall, problem solving, word-finding, visual-perceptual tasks and executive functioning. Patient left upright in bed with alarm on and all needs within reach. Continue with current plan of care.      Pain Pain Assessment Pain Scale: 0-10 Pain Score: 3  Pain Type: Acute pain Pain Location: Foot Pain Orientation: Left Pain Descriptors / Indicators: Aching Pain Frequency: Occasional Pain Onset: Gradual Patients Stated Pain Goal: 2 Pain Intervention(s): Medication (See eMAR) Multiple Pain Sites: No  Therapy/Group:  Individual Therapy  [name] Cruz 07/28/2019, 3:29 PM

## 2019-07-28 NOTE — Progress Notes (Signed)
Orthopedic Tech Progress Note Patient Details:  [name] Cruz 26-Aug-1955 [id number] Called in order to [employer] for a RESTING WHO and a PRAFO BOOT Patient ID: [name] Cruz, male   DOB: [birth year], 64 y.o.   MRN: [id number]   Donald Pore 07/28/2019, 8:18 AM

## 2019-07-28 NOTE — Progress Notes (Signed)
Inpatient Rehabilitation Care Coordinator Assessment and Plan  Patient Details  Name: [name] Cruz MRN: [id number] Date of Birth: 1955/04/09  Today's Date: 07/28/2019  Problem List:  Patient Active Problem List   Diagnosis Date Noted   Thalamic hemorrhage ([place]) 07/26/2019   ICH (intracerebral hemorrhage) ([place]) 07/19/2019   History of DVT in adulthood 02/21/2019   PAD (peripheral artery disease) ([place]) 12/14/2018   Agitation 09/04/2018   Acute CVA (cerebrovascular accident) ([place]) 08/07/2018   Cerebellar stroke, acute ([place]) 07/10/2018   Status epilepticus ([place]) 03/25/2018   Unilateral occipital headache 10/03/2017   Ischemic stroke ([place]) 08/23/2017   Coronary artery disease involving coronary bypass graft of native heart without angina pectoris    Atrial fibrillation (HCC)    Dysphagia, post-stroke    Cardiac arrest ([place]) 08/09/2017   S/P CABG x 3 02/24/2017   NSTEMI (non-ST elevated myocardial infarction) ([place]) 02/21/2017   Encephalomalacia 10/28/2012   Seizure disorder ([place]) 04/26/2012   Alcohol abuse 04/26/2012   History of stroke with current residual effects    Cognitive impairment    HLD (hyperlipidemia)    HTN (hypertension)    Past Medical History:  Past Medical History:  Diagnosis Date   Acute deep vein thrombosis (DVT) of left upper extremity (HCC)    L brachial and basilic veins, eliquis started 08/22/2017 to continue for 3 months total    Cognitive impairment 2007   after stroke, saw rehab but told to stop because was too upsetting to him   History of chicken pox    HLD (hyperlipidemia)    HTN (hypertension)    NSTEMI (non-ST elevated myocardial infarction) ([place]) 02/21/2017   Obesity    Stroke, hemorrhagic ([place]) 2007   thought 2/2 HTN (240sbp); residual cognitive impairment, loss of R peripheral field, no driving   Past Surgical History:  Past Surgical History:  Procedure Laterality Date   ANKLE SURGERY  1990s   right foot  with plate and screws   CORONARY ARTERY BYPASS GRAFT N/A 02/24/2017   3v Procedure: CORONARY ARTERY BYPASS GRAFTING (CABG) x 3 ON PUMP USING LEFT INTERNAL MAMMARY ARTERY TO LEFT ANTERIOR DESENDING CORNARY ARTERY, RIGHT GREATER SAPHENOUS VEIN TO LEFT CIRCUMFLEX ARTERY AND POSTERIOR DESENDING ARTERY. RIGHT GREATER SAPHENOUS VEIN OBTAINED VIA ENDOVEIN HARVEST.;  Surgeon: Grace Isaac, MD   IABP INSERTION N/A 02/21/2017   Procedure: IABP Insertion;  Surgeon: Nelva Bush, MD;  Location: [facility];  Service: Cardiovascular;  Laterality: N/A;   LEFT HEART CATH AND CORONARY ANGIOGRAPHY N/A 02/21/2017   Procedure: LEFT HEART CATH AND CORONARY ANGIOGRAPHY;  Surgeon: Nelva Bush, MD;  Location: [facility];  Service: Cardiovascular;  Laterality: N/A;   TEE WITHOUT CARDIOVERSION N/A 02/24/2017   Procedure: TRANSESOPHAGEAL ECHOCARDIOGRAM (TEE);  Surgeon: Grace Isaac, MD;  Location: [facility];  Service: Open Heart Surgery;  Laterality: N/A;   Social History:  reports that he has never smoked. He has quit using smokeless tobacco. He reports previous alcohol use. He reports that he does not use drugs.  Family / Support Systems Marital Status: Divorced How Long?: 30 years Patient Roles: Parent Spouse/Significant Other: Divorced Children: 1 adult son Other Supports: Sisters: [name] Cruz/[name] Cruz/[name] Cruz Anticipated Caregiver: sisters Ability/Limitations of Caregiver: None reported Caregiver Availability: 24/7 Family Dynamics: Pt lives alone and his sister [name] Cruz comes in to check in on him.  Social History Preferred language: English Religion: Baptist Cultural Background: Pt was self employed in Biomedical scientist (roofing/siding) Read: Yes Write: Yes Employment Status: Retired Public relations account executive Issues:  Denies Guardian/Conservator: N/A   Abuse/Neglect Abuse/Neglect Assessment Can Be Completed: Yes Physical Abuse: Denies Verbal Abuse: Denies Sexual Abuse:  Denies Exploitation of patient/patient's resources: Denies Self-Neglect: Denies  Emotional Status Pt's affect, behavior and adjustment status: Pt in good spirits during time of visit Recent Psychosocial Issues: None reported Psychiatric History: Hx of bi-polar disorder Substance Abuse History: Denies. Pt admits that he quit EtOH 40 yrs ago.  Patient / Family Perceptions, Expectations & Goals Pt/Family understanding of illness & functional limitations: Pt and family have a general understanding of his care needs Premorbid pt/family roles/activities: Assistance with medications/finances Anticipated changes in roles/activities/participation: Assistance with ADLs/IADLs  Education officer, environmental Agencies: None Premorbid Home Care/DME Agencies: None Transportation available at discharge: family  Discharge Planning Living Arrangements: Other relatives, Alone Support Systems: Other relatives Type of Residence: Private residence Insurance Resources: Multimedia programmer (specify) ([employer] Medicare) Museum/gallery curator Resources: [place] Referred: No Money Management: Family Does the patient have any problems obtaining your medications?: No Home Management: Pt has assistance with finances/medications/housekeeping Patient/Family Preliminary Plans: No new changes Care Coordinator Anticipated Follow Up Needs: HH/OP  Clinical Impression SW met with pt in room to introduce self, explain role, and discuss discharge process. Pt states his sister [name] Cruz [phone]) is his HCPOA/POA. Pt is not a English as a second language teacher. HWK:GSUP and RW.   *His sister [name] Cruz states his PCP prescribes all MH medication.   [name] Cruz A [name] Cruz 07/28/2019, 10:11 AM

## 2019-07-28 NOTE — Care Management (Signed)
Inpatient Rehabilitation Center Individual Statement of Services  Patient Name:  [name] Cruz  Date:  07/28/2019  Welcome to the Inpatient Rehabilitation Center.  Our goal is to provide you with an individualized program based on your diagnosis and situation, designed to meet your specific needs.  With this comprehensive rehabilitation program, you will be expected to participate in at least 3 hours of rehabilitation therapies Monday-Friday, with modified therapy programming on the weekends.  Your rehabilitation program will include the following services:  Physical Therapy (PT), Occupational Therapy (OT), Speech Therapy (ST), 24 hour per day rehabilitation nursing, Therapeutic Recreaction (TR), Psychology, Neuropsychology, Care Coordinator, Rehabilitation Medicine, Nutrition Services, Pharmacy Services and Other  Weekly team conferences will be held on Tuesdays to discuss your progress.  Your Inpatient Rehabilitation Care Coordinator will talk with you frequently to get your input and to update you on team discussions.  Team conferences with you and your family in attendance may also be held.  Expected length of stay: 3-4 weeks  Overall anticipated outcome: Minimal Assistance  Depending on your progress and recovery, your program may change. Your Inpatient Rehabilitation Care Coordinator will coordinate services and will keep you informed of any changes. Your Inpatient Rehabilitation Care Coordinator's name and contact numbers are listed  below.  The following services may also be recommended but are not provided by the Inpatient Rehabilitation Center:   Driving Evaluations  Home Health Rehabiltiation Services  Outpatient Rehabilitation Services  Vocational Rehabilitation   Arrangements will be made to provide these services after discharge if needed.  Arrangements include referral to agencies that provide these services.  Your insurance has been verified to be:  [facility] Medicare  Your  primary doctor is:  Julien Girt  Pertinent information will be shared with your doctor and your insurance company.  Inpatient Rehabilitation Care Coordinator:  Susie Cassette [phone] or [phone]  Information discussed with and copy given to patient by: Gretchen Short, 07/28/2019, 10:13 AM

## 2019-07-29 ENCOUNTER — Inpatient Hospital Stay (HOSPITAL_COMMUNITY): Payer: Medicare Other

## 2019-07-29 ENCOUNTER — Inpatient Hospital Stay (HOSPITAL_COMMUNITY): Payer: Medicare Other | Admitting: Occupational Therapy

## 2019-07-29 ENCOUNTER — Inpatient Hospital Stay (HOSPITAL_COMMUNITY): Payer: Medicare Other | Admitting: Speech Pathology

## 2019-07-29 LAB — URINALYSIS, COMPLETE (UACMP) WITH MICROSCOPIC
Bilirubin Urine: NEGATIVE
Glucose, UA: NEGATIVE mg/dL
Hgb urine dipstick: NEGATIVE
Ketones, ur: 5 mg/dL — AB
Nitrite: NEGATIVE
Protein, ur: NEGATIVE mg/dL
Specific Gravity, Urine: 1.029 (ref 1.005–1.030)
pH: 5 (ref 5.0–8.0)

## 2019-07-29 MED ORDER — SENNOSIDES-DOCUSATE SODIUM 8.6-50 MG PO TABS
2.0000 | ORAL_TABLET | Freq: Two times a day (BID) | ORAL | Status: DC
  Administered 2019-07-30 – 2019-08-15 (×33): 2 via ORAL
  Filled 2019-07-29 (×36): qty 2

## 2019-07-29 MED ORDER — BLOOD PRESSURE CONTROL BOOK
Freq: Once | Status: AC
  Filled 2019-07-29: qty 1

## 2019-07-29 MED ORDER — SENNOSIDES-DOCUSATE SODIUM 8.6-50 MG PO TABS
2.0000 | ORAL_TABLET | Freq: Once | ORAL | Status: AC
  Administered 2019-07-29: 2 via ORAL
  Filled 2019-07-29: qty 2

## 2019-07-29 NOTE — Progress Notes (Signed)
Speech Language Pathology Daily Session Note  Patient Details  Name: [name] Cruz MRN: [id number] Date of Birth: 07/22/1955  Today's Date: 07/29/2019 SLP Individual Time: 7096-2836 SLP Individual Time Calculation (min): 45 min  Short Term Goals: Week 1: SLP Short Term Goal 1 (Week 1): Patient will utilize speech intelligibility strategies at the sentence level to achieve ~90% intelligibility with supervision verbal cues. SLP Short Term Goal 2 (Week 1): Patient will consume current diet with minimal overt s/s of aspiration with overall supervision verbal cues for use of swallowing compensatory strategies. SLP Short Term Goal 3 (Week 1): Patient will consume trials of thin liquids with minimal overt s/s of aspiration over 2 sessions to assess readiness for repeat MBS. SLP Short Term Goal 4 (Week 1): Patient will demonstrate functional problem solving for basic and familiar tasks with Min A verbal cues. SLP Short Term Goal 5 (Week 1): Patient will attend to left field of enviornment during functional tasks with Min A verbal cues. SLP Short Term Goal 6 (Week 1): Patient will recall new, daily information with Min A verbal cues.  Skilled Therapeutic Interventions: Skilled treatment session focused on dysphagia and cognitive goals. SLP facilitated session by providing set-up assist and overall Min A verbal cues for problem solving with self-feeding. Patient consumed his breakfast meal of Dys. 3 textures with nectar-thick liquids without overt s/s of aspiration with extra time needed for meal completion. Patient continues to respond, "I understand" to most statements with minimal verbal output of any other content. Patient left upright in bed with alarm on and all needs within reach. Continue with current plan of care.      Pain No/Denies Pain   Therapy/Group: Individual Therapy  Tanazia Achee 07/29/2019, 11:12 AM

## 2019-07-29 NOTE — IPOC Note (Addendum)
Overall Plan of Care [facility]) Patient Details Name: [name] Cruz MRN: [id number] DOB: Mar 05, 1955  Admitting Diagnosis: Thalamic hemorrhage [place])  Hospital Problems: Principal Problem:   Thalamic hemorrhage (HCC)     Functional Problem List: Nursing Motor, Perception, Sensory  PT Balance, Perception, Safety, Behavior, Edema, Endurance, Motor, Sensory  OT Balance, Cognition, Endurance, Motor, Perception, Safety, Sensory, Vision  SLP Cognition, Nutrition  TR         Basic ADL's: OT Eating, Grooming, Bathing, Dressing, Toileting     Advanced  ADL's: OT       Transfers: PT Bed Mobility, Bed to Chair, Car, Occupational psychologist, Research scientist (life sciences): PT Stairs, Psychologist, prison and probation services, Ambulation     Additional Impairments: OT Fuctional Use of Upper Extremity  SLP Swallowing, Communication, Social Cognition expression Problem Solving, Memory, Awareness, Attention  TR      Anticipated Outcomes Item Anticipated Outcome  Self Feeding Supervision  Swallowing  Mod I   Basic self-care  Min A  Toileting  Min A   Bathroom Transfers Min A  Bowel/Bladder  Patient will maintain bowel and bladder continence during IP rehab stay  Transfers  min assist with LRAD  Locomotion  min assist short distance gait with LRAD, min assist w/c mobility  Communication  Mod I  Cognition  Supervision-Min A  Pain  Patient will maintain pain level less than 3 with and without activity tolerance during IP rehab stay  Safety/Judgment  Patient will demonstrate safety awareness and remain free of injury during IP rehab stay   Therapy Plan: PT Intensity: Minimum of 1-2 x/day ,45 to 90 minutes PT Frequency: 5 out of 7 days PT Duration Estimated Length of Stay: 3-4 weeks OT Intensity: Minimum of 1-2 x/day, 45 to 90 minutes OT Frequency: 5 out of 7 days OT Duration/Estimated Length of Stay: 3-4 weeks SLP Intensity: Minumum of 1-2 x/day, 30 to 90 minutes SLP Frequency: 3 to 5 out of 7  days SLP Duration/Estimated Length of Stay: 3-4 weeks   Due to the current state of emergency, patients may not be receiving their 3-hours of Medicare-mandated therapy.   Team Interventions: Nursing Interventions Patient/Family Education, Pain Management, Disease Management/Prevention, Bowel Management, Medication Management, Psychosocial Support, Bladder Management  PT interventions Ambulation/gait training, DME/adaptive equipment instruction, Neuromuscular re-education, Psychosocial support, Stair training, UE/LE Strength taining/ROM, Wheelchair propulsion/positioning, UE/LE Coordination activities, Therapeutic Activities, Skin care/wound management, Pain management, Functional electrical stimulation, Balance/vestibular training, Discharge planning, Cognitive remediation/compensation, Disease management/prevention, Functional mobility training, Patient/family education, Splinting/orthotics, Visual/perceptual remediation/compensation, Therapeutic Exercise  OT Interventions Balance/vestibular training, Cognitive remediation/compensation, Community reintegration, Discharge planning, DME/adaptive equipment instruction, Functional electrical stimulation, Functional mobility training, Neuromuscular re-education, Pain management, Patient/family education, Self Care/advanced ADL retraining, Splinting/orthotics, Therapeutic Activities, Therapeutic Exercise, UE/LE Strength taining/ROM, UE/LE Coordination activities, Visual/perceptual remediation/compensation, Wheelchair propulsion/positioning  SLP Interventions Cognitive remediation/compensation, Dysphagia/aspiration precaution training, Internal/external aids, Speech/Language facilitation, Cueing hierarchy, Environmental controls, Therapeutic Activities, Functional tasks, Patient/family education  TR Interventions    SW/CM Interventions Discharge Planning, Psychosocial Support, Patient/Family Education   Barriers to Discharge MD  Medical stability  Nursing       PT Home environment access/layout, Decreased caregiver support, Incontinence, Lack of/limited family support unsure if family can provide 24 hr physical assist at d/c, steps to enter home  OT      SLP Lack of/limited family support    SW       Team Discharge Planning: Destination: PT-Home ,OT- Home , SLP-Home Projected Follow-up: PT-Home health PT, 24 hour supervision/assistance, OT-  Home health OT, SLP-24 hour supervision/assistance, Home Health SLP Projected Equipment Needs: PT- , OT- To be determined, SLP-To be determined Equipment Details: PT-TBD, per chart pt already has RW & w/c, OT-  Patient/family involved in discharge planning: PT- Patient,  OT-Patient, SLP-Patient  MD ELOS: 3-4 weeks Medical Rehab Prognosis:  Good Assessment: The patient has been admitted for CIR therapies with the diagnosis of right thalamic-capsular hemorrhage. The team will be addressing functional mobility, strength, stamina, balance, safety, adaptive techniques and equipment, self-care, bowel and bladder mgt, patient and caregiver education, NMR, cognition, communication, pain control, ego support. Goals have been set at min assist for mobility and self-care, supervision to min assist with cognition and mod I for communication. Working toward improving bladder emptying and functional use of left arm and leg.   Due to the current state of emergency, patients may not be receiving their 3 hours per day of Medicare-mandated therapy.    Ranelle Oyster, MD, FAAPMR      See Team Conference Notes for weekly updates to the plan of care

## 2019-07-29 NOTE — Progress Notes (Signed)
[facility] PHYSICAL MEDICINE & REHABILITATION PROGRESS NOTE   Subjective/Complaints: Doing well. No complaints. Feels that he's progressing with therapy  ROS: Patient denies fever, rash, sore throat, blurred vision, nausea, vomiting, diarrhea, cough, shortness of breath or chest pain, joint or back pain, headache, or mood change.    Objective:   No results found. Recent Labs    07/27/19 0611  WBC 6.1  HGB 12.2*  HCT 36.4*  PLT 161   Recent Labs    07/27/19 0611  NA 134*  K 4.1  CL 98  CO2 27  GLUCOSE 94  BUN 13  CREATININE 0.71  CALCIUM 8.7*    Intake/Output Summary (Last 24 hours) at 07/29/2019 1036 Last data filed at 07/29/2019 0749 Gross per 24 hour  Intake 718 ml  Output 725 ml  Net -7 ml     Physical Exam: Vital Signs Blood pressure 127/90, pulse 81, temperature 97.8 F (36.6 C), temperature source Oral, resp. rate 18, height 5\' 3"  (1.6 m), weight 78.3 kg, SpO2 97 %.  Constitutional: No distress . Vital signs reviewed. HEENT: EOMI, oral membranes moist Neck: supple Cardiovascular: RRR without murmur. No JVD    Respiratory/Chest: CTA Bilaterally without wheezes or rales. Normal effort    GI/Abdomen: BS +, non-tender, non-distended Ext: no clubbing, cyanosis, or edema Psych: pleasant and cooperative Skin: intact Neuro: Pt is cognitively appropriate with reasonable insight, memory, and awareness.   Sensory exam is normal. Reflexes are 2+ in all 4's. Fine motor coordination is intact. No tremors. Motor function is grossly 5/5 RUE/RLE. LUE 0/5 LLE 1+/5. Sensation tr-1/2 left side. Left central 7 Musculoskeletal: Full ROM, No pain with AROM or PROM in the neck, trunk, or extremities. Posture appropriate Psych: Pt's affect is appropriate. Pt is cooperative      Assessment/Plan: 1. Functional deficits secondary to right thalamocapsular hemorrhage which require 3+ hours per day of interdisciplinary therapy in a comprehensive inpatient rehab  setting.  Physiatrist is providing close team supervision and 24 hour management of active medical problems listed below.  Physiatrist and rehab team continue to assess barriers to discharge/monitor patient progress toward functional and medical goals  Care Tool:  Bathing    Body parts bathed by patient: Left arm, Chest, Abdomen, Right upper leg, Face   Body parts bathed by helper: Right arm, Front perineal area, Buttocks, Left upper leg, Right lower leg     Bathing assist Assist Level: 2 Helpers     Upper Body Dressing/Undressing Upper body dressing   What is the patient wearing?: Pull over shirt    Upper body assist Assist Level: Maximal Assistance - Patient 25 - 49%    Lower Body Dressing/Undressing Lower body dressing      What is the patient wearing?: Incontinence brief     Lower body assist Assist for lower body dressing: Maximal Assistance - Patient 25 - 49%     Toileting Toileting Toileting Activity did not occur (Clothing management and hygiene only): N/A (no void or bm)  Toileting assist Assist for toileting: Dependent - Patient 0%     Transfers Chair/bed transfer  Transfers assist  Chair/bed transfer activity did not occur: Safety/medical concerns  Chair/bed transfer assist level: 2 Helpers     Locomotion Ambulation   Ambulation assist   Ambulation activity did not occur: Safety/medical concerns          Walk 10 feet activity   Assist  Walk 10 feet activity did not occur: Safety/medical concerns  Walk 50 feet activity   Assist Walk 50 feet with 2 turns activity did not occur: Safety/medical concerns         Walk 150 feet activity   Assist Walk 150 feet activity did not occur: Safety/medical concerns         Walk 10 feet on uneven surface  activity   Assist Walk 10 feet on uneven surfaces activity did not occur: Safety/medical concerns         Wheelchair     Assist Will patient use wheelchair at  discharge?: Yes Type of Wheelchair: Manual Wheelchair activity did not occur: Safety/medical concerns  Wheelchair assist level: Maximal Assistance - Patient 25 - 49% Max wheelchair distance: 150 ft    Wheelchair 50 feet with 2 turns activity    Assist    Wheelchair 50 feet with 2 turns activity did not occur: Safety/medical concerns   Assist Level: Maximal Assistance - Patient 25 - 49%   Wheelchair 150 feet activity     Assist  Wheelchair 150 feet activity did not occur: Safety/medical concerns   Assist Level: Maximal Assistance - Patient 25 - 49%   Blood pressure 127/90, pulse 81, temperature 97.8 F (36.6 C), temperature source Oral, resp. rate 18, height 5\' 3"  (1.6 m), weight 78.3 kg, SpO2 97 %.  Medical Problem List and Plan: 1.Left side weakness/dysarthriasecondary to rightthalamocapsularhemorrhage most likely related to hypertensive crisis as well as old left temporal parietal infarction with small vessel disease -patient may shower -ELOS/Goals: 3+ days, min assist goals  -WHO LUE, PRAFO 2. Antithrombotics: -DVT/anticoagulation:SCD -antiplatelet therapy: N/A 3. Pain Management:Tylenol as needed 4. Mood:Zoloft 25 mg daily -antipsychotic agents: Geodon 40 mg nightly -seems to be in good spirits, sleeping well. Continue to monitor 5. Neuropsych: This patientis notcapable of making decisions on hisown behalf. 6. Skin/Wound Care:Routine skin checks 7. Fluids/Electrolytes/Nutrition:. -encourage fluids/po, intake picking up  -protein supp 8. Dysphagia. Dysphagia #3 nectar thick liquids. Advance perspeech therapy  -diflucan x 2 days for thrush on tongue completed 9. History of seizure disorder. Vimpat 200 mg twice daily, Keppra 1500 mg twice daily, Depakote 500 mg twice daily. 10. Hypertension. Coreg 3.125 mg twice daily. Monitor with increased mobility 11. PAF.  Cardiac rate controlled, regular currently -No anticoagulationdue to history of left MCA infarction with hemorrhagic conversion 12. Hyperlipidemia. Resumed Lipitor   13. Obesity. BMI 30. Dietary follow-up 14. CAD with CABG 2019. No chest pain or shortness of breath. 15. Mild thrombocytopenia: 161, follow up cbc while here 16 Neurogenic bladder: I/O cath prn, monitor PVR's  -OOB to void  -will check urine for ua,ucx    LOS: 3 days A FACE TO FACE EVALUATION WAS PERFORMED  2020 07/29/2019, 10:36 AM

## 2019-07-29 NOTE — Progress Notes (Signed)
Occupational Therapy Session Note  Patient Details  Name: [name] Cruz MRN: [id number] Date of Birth: 06/04/1955  Today's Date: 07/29/2019 OT Individual Time: 1032-1129 OT Individual Time Calculation (min): 57 min    Short Term Goals: Week 1:  OT Short Term Goal 1 (Week 1): Pt will complete toilet transfer with max A of 1 OT Short Term Goal 2 (Week 1): Pt will maintain sitting balance at EOB with no more than min A within BADL tasks OT Short Term Goal 3 (Week 1): Pt will wash upper body with no more than mod verbal cues to move onto next body part  Skilled Therapeutic Interventions/Progress Updates:    Pt greeted semi-reclined in bed and agreeable to OT treatment session. Pt able to initiate moving LLE toward EOB, but needed max A overall to come to sitting. Pt with poor sitting balance, requiring mod/max A to maintain midline. Sit<>stand in [place] with max A and +2 needed to maneuver [place] 2/2 pushing to the L. Pt brought to the sink in [place] for BADL tasks. Mirror feedback to try to bring awareness to pushing and achieve more midline positioning. Pt needed max cues to look at mirror and then would attempt to correct balance, but had difficulty motor planning and would just try to stand up and push further to the L. Max facilitation to achieve midline. +2 needed for sit<>stand in [place] while OT washed buttocks and changed brief. Pt already had clean shorts on, so we just pulled those back up after washing. Pt returned to sitting in wc. Worked on UB bathing from wc at the sink. Pt perseverating on washing his face and needed hand over hand A to move onto next body part. Pt needed max cues and hand over hand A to wash L UE 2/2 very poor awareness of L side and apraxia. Max A to thread L UE into shirt, then pt was able to initiate pulling shirt overhead. OT set pt up with SAEBO e-stim. OT placed SAEBO e-stim on wrist extensors. SAEBO left on for 60 minutes. OT returned to remove SAEBO with skin intact  and no adverse reactions.  Saebo Stim One 330 pulse width 35 Hz pulse rate On 8 sec/ off 8 sec Ramp up/ down 2 sec Symmetrical Biphasic wave form  Max intensity 110mA at 500 Ohm load  Pt left seated in wc with alarm belt on, call bell in reach, and needs met.    Therapy Documentation Precautions:  Precautions Precautions: Fall Precaution Comments: L hemi, L inattention, pusher Restrictions Weight Bearing Restrictions: No Pain:  denies pain   Therapy/Group: Individual Therapy   S  07/29/2019, 11:04 AM  

## 2019-07-29 NOTE — Progress Notes (Signed)
Physical Therapy Session Note  Patient Details  Name: [name] Cruz MRN: [id number] Date of Birth: 08/28/1955  Today's Date: 07/29/2019 PT Individual Time: 1400-1500 PT Individual Time Calculation (min): 60 min   Short Term Goals: Week 1:  PT Short Term Goal 1 (Week 1): Pt will complete bed mobilty with hospital bed features & mod assist. PT Short Term Goal 2 (Week 1): Pt will complete bed<>w/c with LRAD & max assist +1. PT Short Term Goal 3 (Week 1): Pt will initiate gait training. PT Short Term Goal 4 (Week 1): Pt will demonstrate sitting balance x 5 minutes with supervision.  Skilled Therapeutic Interventions/Progress Updates:  Pt resting in w/c. LUE noted to be edematous, with inflamed IV site on hand covered by Tegaderm.  Pt denied pain.    W/c propulsion in super hemi ht w/c with shoes on ( donned by PT), pillow behind back, using RUE and RLE to propel x 100' with supervision for steering.  Pt continually veered L and was unaware.  Slide board transfer w/c> level mat to R with mod/max assist.  neuromuscular re-education via demo, multimodal cues for trunk control in sitting: trunk flexion/extension without use of UEs. As he fatigued, pt tilted L with no response unless cued.    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 Sit> stand x 8 with min assist during R hand activity of removing playing cards from vertical surface in front of him.  Pt needed mod assist to maintain standing and extend trunk and LEs as much as possible.    Gait training with weighted grocery cart, x 10' with max assist for progressing LLE, securing R hand to push bar.  With repetition, pt demonstrated some automatic wt shifting with stepping; he did not demonstrate pusher tendencies with gait.  At end of session, pt seated in w/c with needs at hand and seat  belt alarm set.     Therapy Documentation Precautions:  Precautions Precautions: Fall Precaution Comments: L hemi, L inattention, pusher Restrictions Weight Bearing Restrictions: No        Therapy/Group: Individual Therapy  Charnell Peplinski 07/29/2019, 4:20 PM

## 2019-07-29 NOTE — Progress Notes (Signed)
Physical Therapy Session Note  Patient Details  Name: [name] Cruz MRN: [id number] Date of Birth: 1955/11/14  Today's Date: 07/29/2019 PT Individual Time: 0277-4128 PT Individual Time Calculation: 42 min  Short Term Goals: Week 1:  PT Short Term Goal 1 (Week 1): Pt will complete bed mobilty with hospital bed features & mod assist. PT Short Term Goal 2 (Week 1): Pt will complete bed<>w/c with LRAD & max assist +1. PT Short Term Goal 3 (Week 1): Pt will initiate gait training. PT Short Term Goal 4 (Week 1): Pt will demonstrate sitting balance x 5 minutes with supervision.  Skilled Therapeutic Interventions/Progress Updates:     Patient in bed upon PT arrival. Patient alert and agreeable to PT session. Patient denied pain during session. He perseverated on the IV sites on his L hand throughout session, but was easily redirected.  Therapeutic Activity: Bed Mobility: Patient donned pants with max-total A, lifting B LEs the have PT thread his feet through and performed bridging x3 to pull pants up over his hips, patient did attempt to assist with pulling up using his R UE. Patient performed supine to/from sit with mod-max A and a second person close SBA-CGA for safety. Provided verbal cues for performing rolling to R and pushing up to his R elbow then hand to sit up, and leaning on his R elbow to lower his trunk and bring his LEs onto the bed to return to supine.  Neuromuscular Re-ed: Patient performed the following sitting balance and standing balance activities focused on midline orientation and trunk control: -seated balance EOB x6 min: patient with posterior and L lean in sitting progressed from mod A-max A sitting balance to CGA-close supervision using visual target for placing L shoulder to PT's shoulder, placing R hand in his lap, patient able to maintain this requiring cues x2. Patient able to initiate trunk control when leaning out of midline with multimodal cues with delayed awareness of  LOB -performed sit to/from stand in [place] x4 focused on midline orientation and minimizing pushing through R UE and LE. Pushing improved with focus on L LE extension and L hand placed on bar with manual facilitation to maintain grip, also had patient switch his grip with the R hand to an underhand grip to eliminate triceps activation -maintained standing balance 20-60 sec each trial  Patient in bed at end of session with breaks locked, bed alarm set, and all needs within reach.    Therapy Documentation Precautions:  Precautions Precautions: Fall Precaution Comments: L hemi, L inattention, pusher Restrictions Weight Bearing Restrictions: No    Therapy/Group: Individual Therapy  [name] Cruz PT, DPT  07/29/2019, 5:12 PM

## 2019-07-30 ENCOUNTER — Inpatient Hospital Stay (HOSPITAL_COMMUNITY): Payer: Medicare Other | Admitting: Physical Therapy

## 2019-07-30 ENCOUNTER — Telehealth: Payer: Medicare Other

## 2019-07-30 ENCOUNTER — Inpatient Hospital Stay (HOSPITAL_COMMUNITY): Payer: Medicare Other | Admitting: Occupational Therapy

## 2019-07-30 NOTE — Progress Notes (Signed)
Occupational Therapy Session Note  Patient Details  Name: [name] Cruz MRN: [id number] Date of Birth: [birth year]  Today's Date: 07/30/2019  Session 1 OT Individual Time: 4098-1191 OT Individual Time Calculation (min): 43 min   Session 2 OT Individual Time: 4782-9562 OT Individual Time Calculation (min): 74 min    Short Term Goals: Week 1:  OT Short Term Goal 1 (Week 1): Pt will complete toilet transfer with max A of 1 OT Short Term Goal 2 (Week 1): Pt will maintain sitting balance at EOB with no more than min A within BADL tasks OT Short Term Goal 3 (Week 1): Pt will wash upper body with no more than mod verbal cues to move onto next body part  Skilled Therapeutic Interventions/Progress Updates:    Session 1 Pt greeted semi-reclined in bed and agreeable to OT treatment session. PT needed max A to come to sitting EOB. Worked on sitting balance at EOB while nursing administered medications via applesauce. Pt able to achieve intermittent sitting balance with min A< but needed mostly mod/max A 2/2 pushing to the L. Had pt lean down onto R elbow to decrease pushing. Sit<>stand in [place] with pt able to initaite stand with mod A but needed +2 to transfer [place] 2/2 pushing. Mirror feedvack at the sink to bring pt to midline. Max A +2 for peri-care and brief change in standing in stedy. Worked on [employer] shift to the R with pt reaching down to hep pull pants up R leg. Sit<>stand without [place] with mod A +2, then max to maintain balance while +2 pulled up pants. Pt left seated in wc at end of session with chair alarm on, call bell in reach, and needs met.   Session 2 Pt greeted seated in wc after finishing lunch with nurse tech. Pt reported need to have a BM. Stedy used for [facility] transfer with max A+2. Pt had already had incontinent smear of BM prior to getting on commode. OT turned off TV to limit distractions, but pt was unable to have more BM despite giving ample time and verbal cues. Pt  thought he had had a large BM in commode, but he had done nothing. Max+2 for peri-care and to don clean brief. Max A and facilitation at hips to achieve full hip/trunk extension into standing in [place]. Before transferring back to wc, OT placed towel wedge under L hip to promote neutral pelvic position. OT assisted with hair washing task at the sink. Pt with difficulty maintaining attention to directions to maintain tight pull on handle for hair washing basin, requiring assistance to sustain grasp. Max A to wash hair in sink. Worked on visual scanning to the midline and to the L to locate comb with max cues. Pt then knew what to do with comb and brought to scalp. Pt broutght to therapy gym and worked on L UE NMR with OT providing joint  Input through wrist and elbow to bring pt through full ROM. Slight increase in flexor tone in pecs noted. OT placed SAEBO e-stim on wrist extensors. SAEBO left on for 60 minutes. OT returned to remove SAEBO with skin intact and no adverse reactions.  Saebo Stim One 330 pulse width 35 Hz pulse rate On 8 sec/ off 8 sec Ramp up/ down 2 sec Symmetrical Biphasic wave form  Max intensity 125m at 500 Ohm load  Pt left seated in wc with alarm belt on, call bell in reach, and needs met.   Therapy Documentation Precautions:  Precautions Precautions: Fall Precaution Comments: L hemi, L inattention, pusher Restrictions Weight Bearing Restrictions: No Pain:  denies pain   Therapy/Group: Individual Therapy  Valma Cava 07/30/2019, 3:01 PM

## 2019-07-30 NOTE — Progress Notes (Signed)
Physical Therapy Session Note  Patient Details  Name: [name] Cruz MRN: [id number] Date of Birth: 1955/09/08  Today's Date: 07/30/2019 PT Individual Time: 1445-1540 PT Individual Time Calculation (min): 55 min   Short Term Goals: Week 1:  PT Short Term Goal 1 (Week 1): Pt will complete bed mobilty with hospital bed features & mod assist. PT Short Term Goal 2 (Week 1): Pt will complete bed<>w/c with LRAD & max assist +1. PT Short Term Goal 3 (Week 1): Pt will initiate gait training. PT Short Term Goal 4 (Week 1): Pt will demonstrate sitting balance x 5 minutes with supervision.  Skilled Therapeutic Interventions/Progress Updates:  Pt received in w/c reporting incontinence. At sink pt transfers sit<>stand with +2 assist with max/total assist for standing balance 2/2 significant pushing to L and inability to come to full upright standing to allow 2nd person to perform peri hygiene & change brief dependently. Pt unable to correct posture even with max multimodal cuing. Focused on transfers w/c>bed with pt requiring +2 assist to pivot after coming to standing to safely sit on bed. Pt requires significantly extra time to initiate & complete all movements. Attempted to have pt hold to chair armrest in front of him but pt continues to push vs pull. Pt also with very poor spatial awareness as pt is leaning over to L into therapist's lap and reports he's unaware of LOB when asked. Pt requires +2 assist for sit>supine & reports need to have BM. Pt rolls R with +2 assist and therapist places bed pan but pt unable to have BM when given extra time so pt assisted with pulling clothing over hips. Pt's sister Steward Drone) arrived & PT educated her on pt's need for physical assist at d/c, reporting pt's other sister Lupita Leash) lives with pt. Pt left in bed with sister present & NT entering room.  Therapy Documentation Precautions:  Precautions Precautions: Fall Precaution Comments: L hemi, L inattention,  pusher Restrictions Weight Bearing Restrictions: No  Pain: No c/o or behaviors demonstrating pain.   Therapy/Group: Individual Therapy  [name] Cruz 07/30/2019, 4:08 PM

## 2019-07-30 NOTE — Progress Notes (Signed)
[place] PHYSICAL MEDICINE & REHABILITATION PROGRESS NOTE   Subjective/Complaints: Has pain on left hand d/t IV which infiltrated. Otherwise doing well  ROS: Patient denies fever, rash, sore throat, blurred vision, nausea, vomiting, diarrhea, cough, shortness of breath or chest pain, joint or back pain, headache, or mood change.     Objective:   No results found. No results for input(s): WBC, HGB, HCT, PLT in the last 72 hours. No results for input(s): NA, K, CL, CO2, GLUCOSE, BUN, CREATININE, CALCIUM in the last 72 hours.  Intake/Output Summary (Last 24 hours) at 07/30/2019 1039 Last data filed at 07/30/2019 0833 Gross per 24 hour  Intake 838 ml  Output 675 ml  Net 163 ml     Physical Exam: Vital Signs Blood pressure 110/74, pulse (!) 44, temperature 97.9 F (36.6 C), resp. rate 18, height 5\' 3"  (1.6 m), weight 78.3 kg, SpO2 95 %.  Constitutional: No distress . Vital signs reviewed. HEENT: EOMI, oral membranes moist Neck: supple Cardiovascular: RRR without murmur. No JVD    Respiratory/Chest: CTA Bilaterally without wheezes or rales. Normal effort    GI/Abdomen: BS +, non-tender, non-distended Ext: no clubbing, cyanosis, or edema Psych: pleasant and cooperative Skin: sl erythema and tenderness along dorsum of left hand Neuro: Pt is cognitively appropriate with reasonable insight, memory, and awareness.   Sensory exam is normal. Reflexes are 2+ in all 4's. Fine motor coordination is intact. No tremors. Motor function is grossly 5/5 RUE/RLE. LUE 0/5 LLE 1+/5. Sensation tr-1/2 left side. Left central 7 Musculoskeletal: Full ROM, No pain with AROM or PROM in the neck, trunk, or extremities. Posture appropriate Psych: Pt's affect is appropriate. Pt is cooperative      Assessment/Plan: 1. Functional deficits secondary to right thalamocapsular hemorrhage which require 3+ hours per day of interdisciplinary therapy in a comprehensive inpatient rehab setting.  Physiatrist is  providing close team supervision and 24 hour management of active medical problems listed below.  Physiatrist and rehab team continue to assess barriers to discharge/monitor patient progress toward functional and medical goals  Care Tool:  Bathing    Body parts bathed by patient: Left arm, Chest, Abdomen, Right upper leg, Face   Body parts bathed by helper: Right arm, Front perineal area, Buttocks, Left upper leg, Right lower leg     Bathing assist Assist Level: 2 Helpers     Upper Body Dressing/Undressing Upper body dressing   What is the patient wearing?: Pull over shirt    Upper body assist Assist Level: Maximal Assistance - Patient 25 - 49%    Lower Body Dressing/Undressing Lower body dressing      What is the patient wearing?: Incontinence brief     Lower body assist Assist for lower body dressing: Maximal Assistance - Patient 25 - 49%     Toileting Toileting Toileting Activity did not occur (Clothing management and hygiene only): N/A (no void or bm)  Toileting assist Assist for toileting: Dependent - Patient 0%     Transfers Chair/bed transfer  Transfers assist  Chair/bed transfer activity did not occur: Safety/medical concerns  Chair/bed transfer assist level: 2 Helpers     Locomotion Ambulation   Ambulation assist   Ambulation activity did not occur: Safety/medical concerns          Walk 10 feet activity   Assist  Walk 10 feet activity did not occur: Safety/medical concerns        Walk 50 feet activity   Assist Walk 50 feet with 2 turns  activity did not occur: Safety/medical concerns         Walk 150 feet activity   Assist Walk 150 feet activity did not occur: Safety/medical concerns         Walk 10 feet on uneven surface  activity   Assist Walk 10 feet on uneven surfaces activity did not occur: Safety/medical concerns         Wheelchair     Assist Will patient use wheelchair at discharge?: Yes Type of  Wheelchair: Manual Wheelchair activity did not occur: Safety/medical concerns  Wheelchair assist level: Maximal Assistance - Patient 25 - 49% Max wheelchair distance: 150 ft    Wheelchair 50 feet with 2 turns activity    Assist    Wheelchair 50 feet with 2 turns activity did not occur: Safety/medical concerns   Assist Level: Maximal Assistance - Patient 25 - 49%   Wheelchair 150 feet activity     Assist  Wheelchair 150 feet activity did not occur: Safety/medical concerns   Assist Level: Maximal Assistance - Patient 25 - 49%   Blood pressure 110/74, pulse (!) 44, temperature 97.9 F (36.6 C), resp. rate 18, height 5\' 3"  (1.6 m), weight 78.3 kg, SpO2 95 %.  Medical Problem List and Plan: 1.Left side weakness/dysarthriasecondary to rightthalamocapsularhemorrhage most likely related to hypertensive crisis as well as old left temporal parietal infarction with small vessel disease -patient may shower -ELOS/Goals: 3+ days, min assist goals  -WHO LUE, PRAFO  --Continue CIR therapies including PT, OT, and SLP  2. Antithrombotics: -DVT/anticoagulation:SCD -antiplatelet therapy: N/A 3. Pain Management:Tylenol as needed 4. Mood:Zoloft 25 mg daily -antipsychotic agents: Geodon 40 mg nightly -mood well controlled 5. Neuropsych: This patientis notcapable of making decisions on hisown behalf. 6. Skin/Wound Care:local care to hand 7. Fluids/Electrolytes/Nutrition:. -encourage fluids/po, intake picking up  -protein supp 8. Dysphagia. Dysphagia #3 nectar thick liquids. Advance perspeech therapy   9. History of seizure disorder. Vimpat 200 mg twice daily, Keppra 1500 mg twice daily, Depakote 500 mg twice daily. 10. Hypertension. Coreg 3.125 mg twice daily. Monitor with increased mobility 11. PAF. Cardiac rate controlled, regular currently -No anticoagulationdue to history  of left MCA infarction with hemorrhagic conversion 12. Hyperlipidemia. Resumed Lipitor   13. Obesity. BMI 30. Dietary follow-up 14. CAD with CABG 2019. No chest pain or shortness of breath. 15. Mild thrombocytopenia: 161, follow up cbc while here 16 Neurogenic bladder: still retaining  - I/O cath prn, monitor PVR's  -OOB to void  -ua is equivocal, ucx pending    LOS: 4 days A FACE TO FACE EVALUATION WAS PERFORMED  2020 07/30/2019, 10:39 AM

## 2019-07-31 ENCOUNTER — Inpatient Hospital Stay (HOSPITAL_COMMUNITY): Payer: Medicare Other | Admitting: Occupational Therapy

## 2019-07-31 DIAGNOSIS — A499 Bacterial infection, unspecified: Secondary | ICD-10-CM

## 2019-07-31 DIAGNOSIS — N390 Urinary tract infection, site not specified: Secondary | ICD-10-CM

## 2019-07-31 LAB — URINE CULTURE: Culture: 80000 — AB

## 2019-07-31 IMAGING — MR MRI HEAD WITHOUT CONTRAST
12 of 13 series · 44 of 48 positions shown · non-contrast
Comparison: Head CT 08/07/2018 and MRI 07/09/2018

CLINICAL DATA: Altered level of consciousness. Facial droop and
aphasia.

EXAM:
MRI HEAD WITHOUT CONTRAST
TECHNIQUE: Multiplanar, multiecho pulse sequences of the brain and surrounding
structures were obtained without intravenous contrast.

[Series 5: DWI · axial · 3.0mm · 0.88mm/px · z∈[-4,+153]mm · 9 of 108 slices shown (1 of 4)]
[im 1/108]
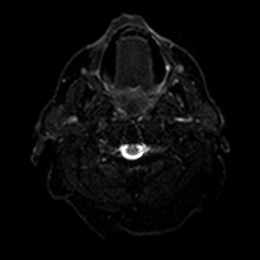
[im 14/108]
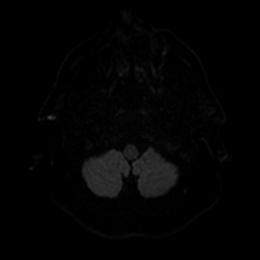
[im 27/108]
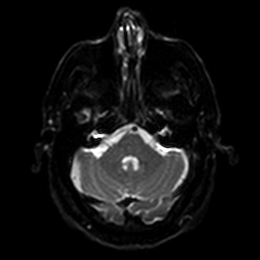
[im 41/108]
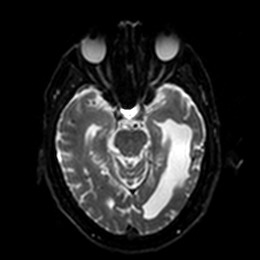
[im 54/108]
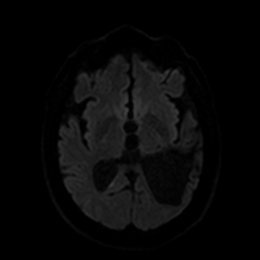
[im 67/108]
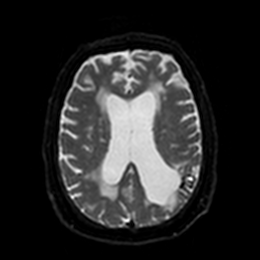
[im 81/108]
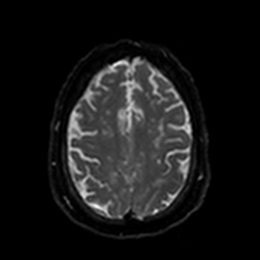
[im 94/108]
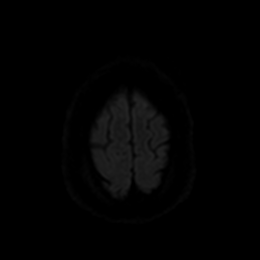
[im 108/108]
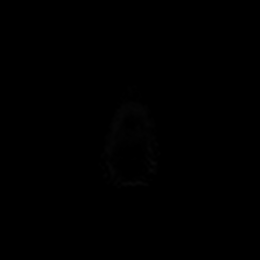

[Series 6: DWI · axial · 3.0mm · 0.88mm/px · z∈[-4,+153]mm · 4 of 54 slices shown (2 of 4)]
[im 1/54]
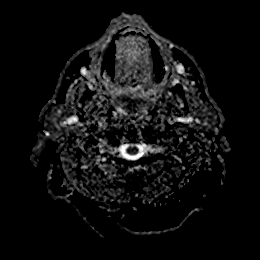
[im 18/54]
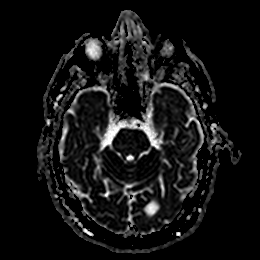
[im 36/54]
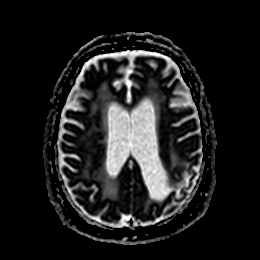
[im 54/54]
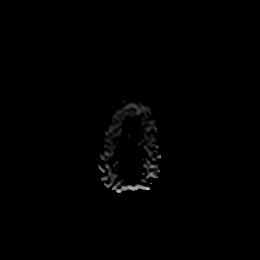

[Series 7: DWI · coronal · 4.0mm · 0.88mm/px · 5 of 76 slices shown (3 of 4)]
[im 1/76]
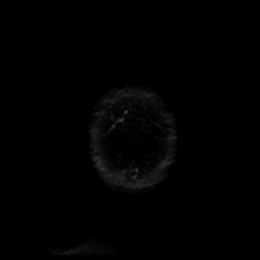
[im 19/76]
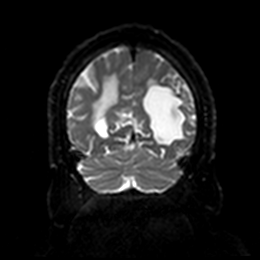
[im 38/76]
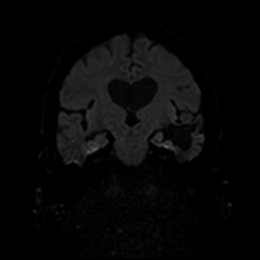
[im 57/76]
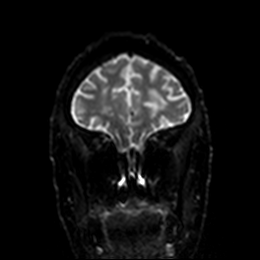
[im 76/76]
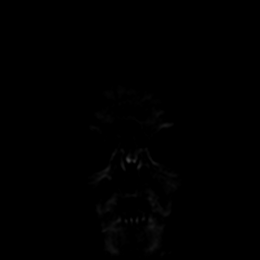

[Series 8: DWI · coronal · 4.0mm · 0.88mm/px · 3 of 38 slices shown (4 of 4)]
[im 1/38]
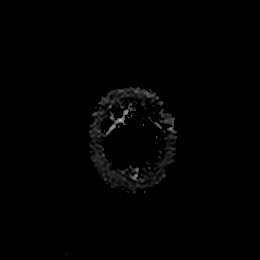
[im 19/38]
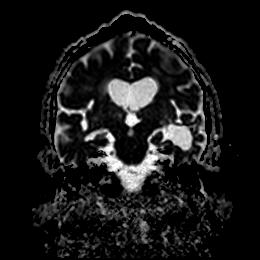
[im 38/38]
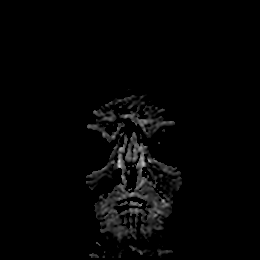

[Series 9: mag_images · axial · 3.0mm · 0.90mm/px · z∈[-8,+166]mm · 4 of 60 slices shown]
[im 1/60]
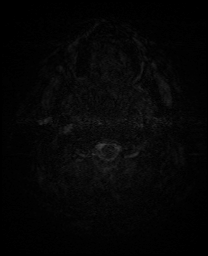
[im 20/60]
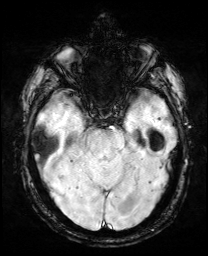
[im 40/60]
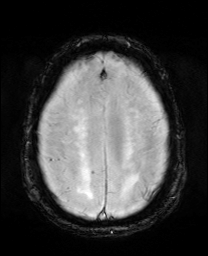
[im 60/60]
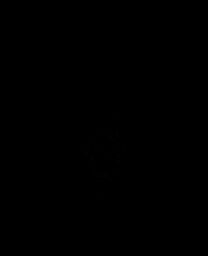

[Series 10: pha_images · axial · 3.0mm · 0.90mm/px · z∈[-8,+161]mm · 4 of 58 slices shown]
[im 1/58]
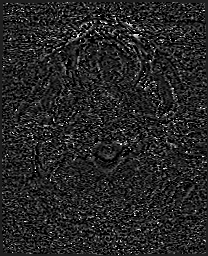
[im 20/58]
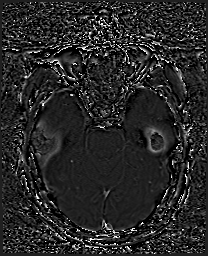
[im 39/58]
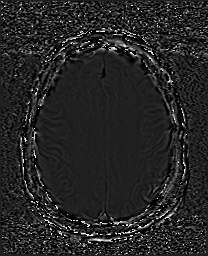
[im 58/58]
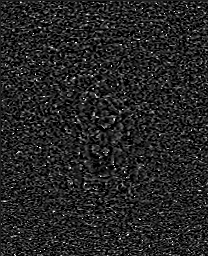

[Series 11: swi_images · axial · 3.0mm · 0.90mm/px · z∈[-8,+166]mm · 4 of 60 slices shown]
[im 1/60]
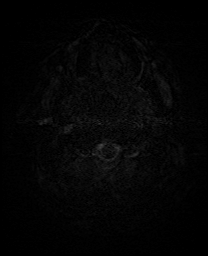
[im 20/60]
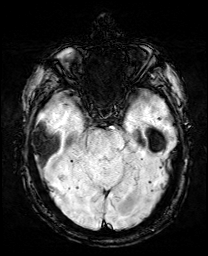
[im 40/60]
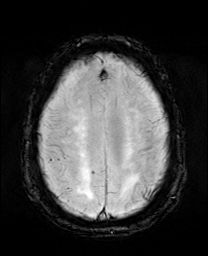
[im 60/60]
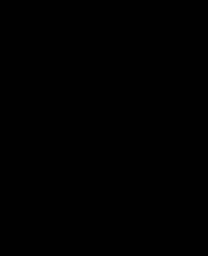

[Series 12: mip_images(sw) · axial · 24.0mm · 0.90mm/px · z∈[+2,+156]mm · 4 of 53 slices shown]
[im 1/53]
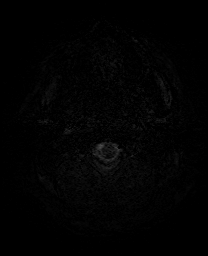
[im 18/53]
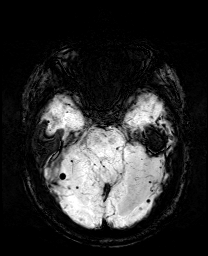
[im 35/53]
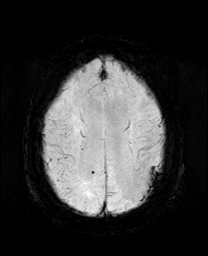
[im 53/53]
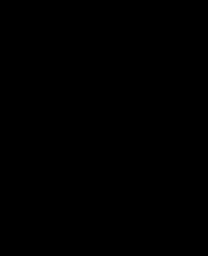

[Series 13: T1 · sagittal · 5.0mm · 0.78mm/px · 1 of 20 slices shown]
[im 1/20]
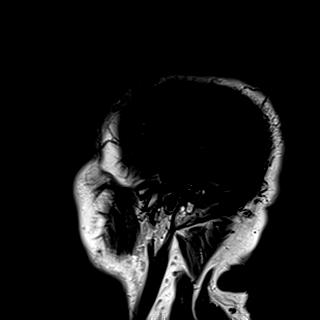

[Series 14: FLAIR · axial · 5.0mm · 0.45mm/px · z∈[-3,+139]mm · 2 of 25 slices shown]
[im 1/25]
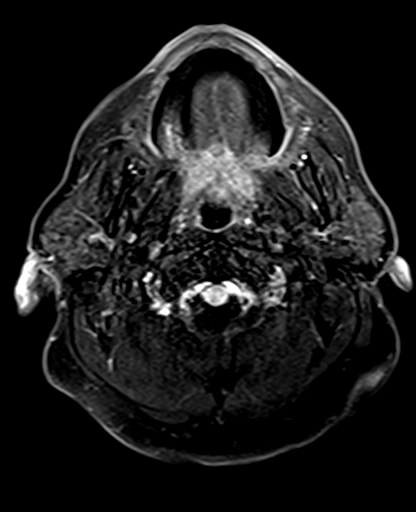
[im 25/25]
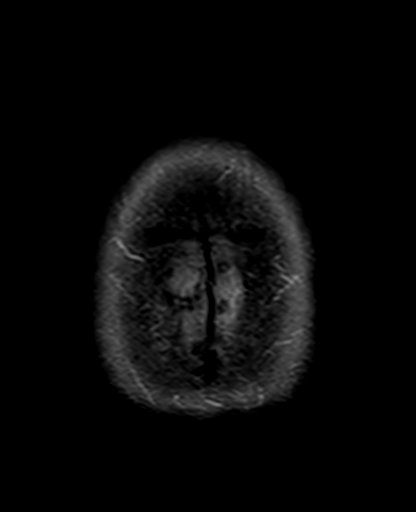

[Series 15: T2 · axial · 5.0mm · 0.72mm/px · z∈[-2,+140]mm · 2 of 25 slices shown (1 of 2)]
[im 1/25]
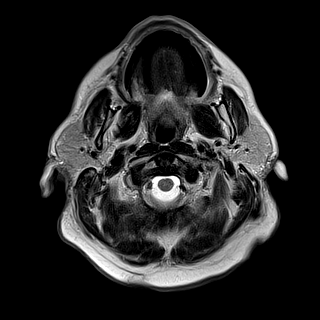
[im 25/25]
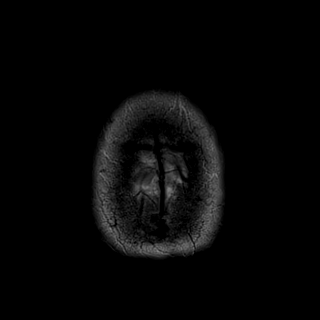

[Series 17: T2 · coronal · 5.0mm · 0.34mm/px · 2 of 29 slices shown (2 of 2)]
[im 1/29]
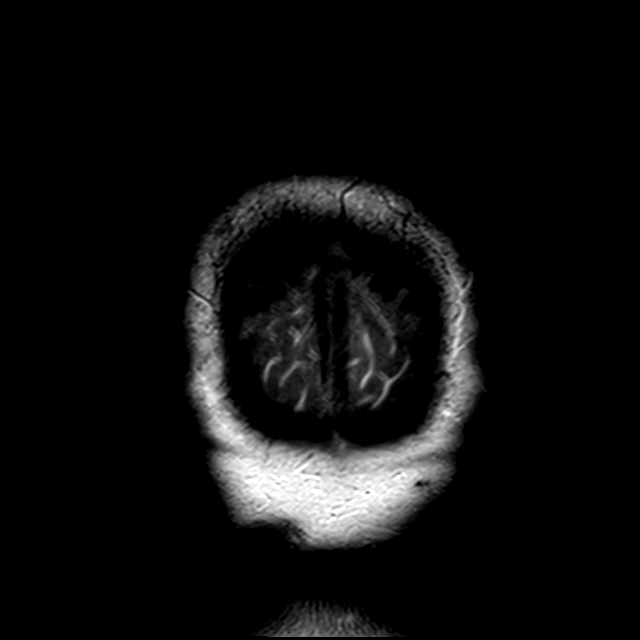
[im 29/29]
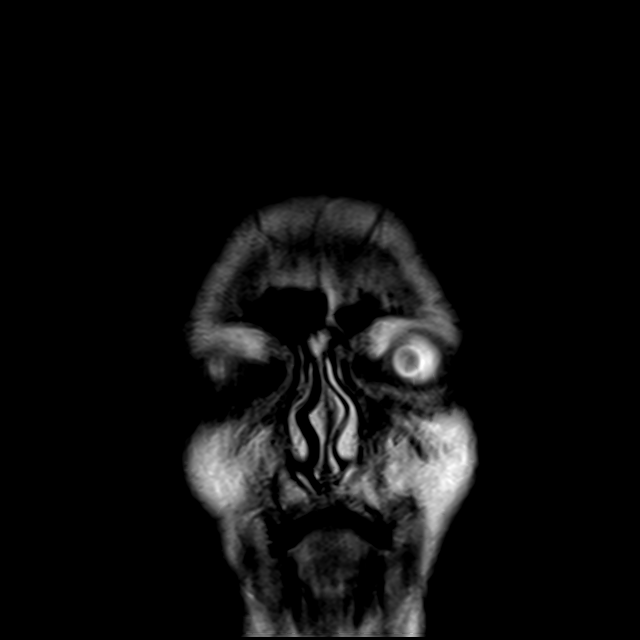

[44 of 48 positions shown; findings below may reference images not displayed]

FINDINGS: Brain: There is a 4 mm focus of trace diffusion signal
hyperintensity involving white matter of right forceps major with
normal to subtly reduced ADC. No acute infarct is identified
elsewhere. Left temporoparietal encephalomalacia is again noted
related to a remote hemorrhage with ex vacuo dilatation of the left
lateral ventricle. Multiple chronic microhemorrhages involving both
cerebral hemispheres are similar to the prior study with superficial
greater than deep involvement. Patchy to confluent T2
hyperintensities in the cerebral white matter bilaterally are
unchanged from the prior MRI and nonspecific but compatible with
moderate chronic small vessel ischemic disease. Chronic lacunar
infarcts are present in the right thalamus and genu of the right
internal capsule. There is moderate global cerebral atrophy.

Vascular: Major intracranial vascular flow voids are preserved.

Skull and upper cervical spine: Unremarkable bone marrow signal.

Sinuses/Orbits: Unremarkable orbits. Clear paranasal sinuses.
Chronic left larger than right mastoid effusions.

Other: None.
IMPRESSION: 1. 4 mm acute/subacute white matter infarct involving right forceps
major.
2. Moderate chronic small vessel ischemic disease.
3. Remote left temporoparietal hemorrhage with resultant
encephalomalacia.
4. Multiple chronic cerebral microhemorrhages which could reflect
cerebral amyloid angiopathy or hypertension.

## 2019-07-31 MED ORDER — NITROFURANTOIN MONOHYD MACRO 100 MG PO CAPS
100.0000 mg | ORAL_CAPSULE | Freq: Two times a day (BID) | ORAL | Status: AC
  Administered 2019-07-31 – 2019-08-04 (×10): 100 mg via ORAL
  Filled 2019-07-31 (×10): qty 1

## 2019-07-31 NOTE — Progress Notes (Signed)
[place] PHYSICAL MEDICINE & REHABILITATION PROGRESS NOTE   Subjective/Complaints: Lying in bed. Just had bm in brief. Said he couldn't hold it for staff to come help him  ROS: Patient denies fever, rash, sore throat, blurred vision, nausea, vomiting, diarrhea, cough, shortness of breath or chest pain, joint or back pain, headache, or mood change.      Objective:   No results found. No results for input(s): WBC, HGB, HCT, PLT in the last 72 hours. No results for input(s): NA, K, CL, CO2, GLUCOSE, BUN, CREATININE, CALCIUM in the last 72 hours.  Intake/Output Summary (Last 24 hours) at 07/31/2019 0921 Last data filed at 07/31/2019 0800 Gross per 24 hour  Intake 598 ml  Output 1600 ml  Net -1002 ml     Physical Exam: Vital Signs Blood pressure 113/78, pulse 73, temperature 98.1 F (36.7 C), temperature source Oral, resp. rate 16, height 5\' 3"  (1.6 m), weight 78.3 kg, SpO2 98 %.  Constitutional: No distress . Vital signs reviewed. HEENT: EOMI, oral membranes moist Neck: supple Cardiovascular: RRR without murmur. No JVD    Respiratory/Chest: CTA Bilaterally without wheezes or rales. Normal effort    GI/Abdomen: BS +, non-tender, non-distended Ext: no clubbing, cyanosis, or edema Psych: pleasant and cooperative Skin: decr erythema and tenderness along dorsum of left hand Neuro: Pt is cognitively appropriate with reasonable insight, memory, and awareness.   Sensory exam is normal. Reflexes are 2+ in all 4's. Fine motor coordination is intact. No tremors. Motor function is grossly 5/5 RUE/RLE. LUE 0/5 LLE 1+/5. Sensation tr-1/2 left side. Left central 7 Musculoskeletal: Full ROM, No pain with AROM or PROM in the neck, trunk, or extremities. Posture appropriate        Assessment/Plan: 1. Functional deficits secondary to right thalamocapsular hemorrhage which require 3+ hours per day of interdisciplinary therapy in a comprehensive inpatient rehab setting.  Physiatrist is  providing close team supervision and 24 hour management of active medical problems listed below.  Physiatrist and rehab team continue to assess barriers to discharge/monitor patient progress toward functional and medical goals  Care Tool:  Bathing    Body parts bathed by patient: Left arm, Chest, Abdomen, Right upper leg, Face   Body parts bathed by helper: Right arm, Front perineal area, Buttocks, Left upper leg, Right lower leg     Bathing assist Assist Level: 2 Helpers     Upper Body Dressing/Undressing Upper body dressing   What is the patient wearing?: Pull over shirt    Upper body assist Assist Level: Maximal Assistance - Patient 25 - 49%    Lower Body Dressing/Undressing Lower body dressing      What is the patient wearing?: Incontinence brief     Lower body assist Assist for lower body dressing: Maximal Assistance - Patient 25 - 49%     Toileting Toileting Toileting Activity did not occur (Clothing management and hygiene only): N/A (no void or bm)  Toileting assist Assist for toileting: Dependent - Patient 0%     Transfers Chair/bed transfer  Transfers assist  Chair/bed transfer activity did not occur: Safety/medical concerns  Chair/bed transfer assist level: 2 Helpers     Locomotion Ambulation   Ambulation assist   Ambulation activity did not occur: Safety/medical concerns          Walk 10 feet activity   Assist  Walk 10 feet activity did not occur: Safety/medical concerns        Walk 50 feet activity   Assist Walk 50  feet with 2 turns activity did not occur: Safety/medical concerns         Walk 150 feet activity   Assist Walk 150 feet activity did not occur: Safety/medical concerns         Walk 10 feet on uneven surface  activity   Assist Walk 10 feet on uneven surfaces activity did not occur: Safety/medical concerns         Wheelchair     Assist Will patient use wheelchair at discharge?: Yes Type of  Wheelchair: Manual Wheelchair activity did not occur: Safety/medical concerns  Wheelchair assist level: Maximal Assistance - Patient 25 - 49% Max wheelchair distance: 150 ft    Wheelchair 50 feet with 2 turns activity    Assist    Wheelchair 50 feet with 2 turns activity did not occur: Safety/medical concerns   Assist Level: Maximal Assistance - Patient 25 - 49%   Wheelchair 150 feet activity     Assist  Wheelchair 150 feet activity did not occur: Safety/medical concerns   Assist Level: Maximal Assistance - Patient 25 - 49%   Blood pressure 113/78, pulse 73, temperature 98.1 F (36.7 C), temperature source Oral, resp. rate 16, height 5\' 3"  (1.6 m), weight 78.3 kg, SpO2 98 %.  Medical Problem List and Plan: 1.Left side weakness/dysarthriasecondary to rightthalamocapsularhemorrhage most likely related to hypertensive crisis as well as old left temporal parietal infarction with small vessel disease -patient may shower -ELOS/Goals: 3+ days, min assist goals  -WHO LUE, PRAFO  --Continue CIR therapies including PT, OT, and SLP  2. Antithrombotics: -DVT/anticoagulation:SCD -antiplatelet therapy: N/A 3. Pain Management:Tylenol as needed 4. Mood:Zoloft 25 mg daily -antipsychotic agents: Geodon 40 mg nightly -mood well controlled 5. Neuropsych: This patientis notcapable of making decisions on hisown behalf. 6. Skin/Wound Care:local care to hand 7. Fluids/Electrolytes/Nutrition:. -encourage fluids/po, intake picking up  -protein supp 8. Dysphagia. Dysphagia #3 nectar thick liquids. Advance perspeech therapy   9. History of seizure disorder. Vimpat 200 mg twice daily, Keppra 1500 mg twice daily, Depakote 500 mg twice daily.  10. Hypertension. Coreg 3.125 mg twice daily.  Controlled at present 11. PAF. Cardiac rate controlled, regular currently -No  anticoagulationdue to history of left MCA infarction with hemorrhagic conversion 12. Hyperlipidemia. Resumed Lipitor   13. Obesity. BMI 30. Dietary follow-up 14. CAD with CABG 2019. No chest pain or shortness of breath. 15. Mild thrombocytopenia: 161, follow up cbc while here 16 Neurogenic bladder: still retaining  - I/O cath prn, monitor PVR's  -OOB to void  7/17 -ua is equivocal, ucx 80k staph epi--S to macrodantin--->5 days    LOS: 5 days A FACE TO FACE EVALUATION WAS PERFORMED  8/17 07/31/2019, 9:21 AM

## 2019-07-31 NOTE — Plan of Care (Signed)
°  Problem: Consults Goal: RH GENERAL PATIENT EDUCATION Description: See Patient Education module for education specifics. Outcome: Progressing Goal: Skin Care Protocol Initiated - if Braden Score 18 or less Description: If consults are not indicated, leave blank or document N/A Outcome: Progressing Goal: Nutrition Consult-if indicated Outcome: Progressing   Problem: RH BOWEL ELIMINATION Goal: RH STG MANAGE BOWEL WITH ASSISTANCE Description: STG Manage Bowel with mod I Assistance. Outcome: Progressing Goal: RH STG MANAGE BOWEL W/MEDICATION W/ASSISTANCE Description: STG Manage Bowel with Medication with mod I Assistance. Outcome: Progressing   Problem: RH BLADDER ELIMINATION Goal: RH STG MANAGE BLADDER WITH ASSISTANCE Description: STG Manage Bladder With min Assistance Outcome: Progressing Goal: RH STG MANAGE BLADDER WITH EQUIPMENT WITH ASSISTANCE Description: STG Manage Bladder With Equipment With min Assistance Outcome: Progressing   Problem: RH SAFETY Goal: RH STG ADHERE TO SAFETY PRECAUTIONS W/ASSISTANCE/DEVICE Description: STG Adhere to Safety Precautions With cues and reminders Outcome: Progressing Goal: RH STG DECREASED RISK OF FALL WITH ASSISTANCE Description: STG Decreased Risk of Fall With cues and reminders  Outcome: Progressing   Problem: RH PAIN MANAGEMENT Goal: RH STG PAIN MANAGED AT OR BELOW PT'S PAIN GOAL Description: Less than 4 on scale of 0-10 Outcome: Progressing   Problem: Consults Goal: RH STROKE PATIENT EDUCATION Description: See Patient Education module for education specifics  Outcome: Progressing   

## 2019-08-01 ENCOUNTER — Inpatient Hospital Stay (HOSPITAL_COMMUNITY): Payer: Medicare Other

## 2019-08-01 ENCOUNTER — Inpatient Hospital Stay (HOSPITAL_COMMUNITY): Payer: Medicare Other | Admitting: Occupational Therapy

## 2019-08-01 NOTE — Progress Notes (Signed)
Physical Therapy Session Note  Patient Details  Name: [name] Cruz MRN: [id number] Date of Birth: 1955/02/06  Today's Date: 08/01/2019 PT Individual Time: 1010-1110 PT Individual Time Calculation (min): 60 min   Short Term Goals: Week 1:  PT Short Term Goal 1 (Week 1): Pt will complete bed mobilty with hospital bed features & mod assist. PT Short Term Goal 2 (Week 1): Pt will complete bed<>w/c with LRAD & max assist +1. PT Short Term Goal 3 (Week 1): Pt will initiate gait training. PT Short Term Goal 4 (Week 1): Pt will demonstrate sitting balance x 5 minutes with supervision.  Skilled Therapeutic Interventions/Progress Updates:     Patient in w/c in the room upon PT arrival. Patient alert and agreeable to PT session. Patient reported mild L hand pain at old IV sites during session, RN made aware. PT provided repositioning, rest breaks, and distraction as pain interventions throughout session.   Therapeutic Activity: Transfers: Patient performed squat pivot bed<>mat table with max A of 1 person. Provided cues for hand placement, head hips relationship, pushing through B LEs, and PT blocking L knee/foot to prevent sliding. Attempted to stand with weighted cart in hopes to progress to gait training, however patient unable to initiate stand due to fatigue.   Wheelchair Mobility:  Patient propelled wheelchair >100 feet using R UE and LE hemi technique with supervision-min A for [facility] assist for turning technique. Provided verbal cues for propulsion and turning technique   Neuromuscular Re-ed: Patient performed the following seated and standing activities for improved midline orientation with functional mobility: He performed sit to/from stand x4 with mod A using a R rail. Provided verbal cues and manual facilitation for forward weight shift, L knee extension, trunk/hip extension, and midline orientation with a mirror in front for visual feedback. Patient stood x10-30 sec each trial, varried  based on fatigue, patient would self initiate sitting and required facilitation to bring his L hip back into the seat.  Sitting balance EOM x5 min progressing from mod A-close supervision with cues to match PT's shoulder on his R and visual feedback from a mirror in front of him. Patient was able to acknowledge that he was not sitting symmetrically, however unable to problem solve how to fix his positioning requiring manual facilitation for foot placement and hip and trunk alignment Performed B shoulder shrugs and protraction/retraction in front of the mirror with minimal to no activation on L side and poor attention to L side throughout.  Propelled w/c using B LE 25 feet for reciprocal stepping, patient required max-total A for stepping with L LE with increased activation from his hamstring to pull.   Patient with flat affect throughout session and required increased time with all mobility due to decreased attention and activity tolerance.  Patient in w/c in the room at end of session with breaks locked, seat belt alarm set, and all needs within reach.    Therapy Documentation Precautions:  Precautions Precautions: Fall Precaution Comments: L hemi, L inattention, pusher Restrictions Weight Bearing Restrictions: No    Therapy/Group: Individual Therapy  Julen Rubert L Orlando Thalmann PT, DPT  08/01/2019, 12:25 PM

## 2019-08-01 NOTE — Progress Notes (Signed)
Occupational Therapy Session Note  Patient Details  Name: [name] Cruz MRN: [id number] Date of Birth: [birth year]  Today's Date: 08/01/2019 OT Individual Time: 6435-3912 OT Individual Time Calculation (min): 54 min    Short Term Goals: Week 1:  OT Short Term Goal 1 (Week 1): Pt will complete toilet transfer with max A of 1 OT Short Term Goal 2 (Week 1): Pt will maintain sitting balance at EOB with no more than min A within BADL tasks OT Short Term Goal 3 (Week 1): Pt will wash upper body with no more than mod verbal cues to move onto next body part  Skilled Therapeutic Interventions/Progress Updates:    Pt in bed supine at time of session eating breakfast, no c/o pain throughout session. Agreeable to sit up in wheelchair to finish eating breakfast. LB dressing at bed level total A with pt rolling L and R Max/total for therapist to don pants over hips. Supine to sit Mod/Max and once sitting EOB did have L lateral and posterior lean, improved with continued sitting EOB. UB dressing Mod A with cues to thread LUE first but pt unable to follow command and continued to dress RUE first. Sitting balance varied between Min-Mod while sitting EOB with feet on the floor for support. Sit to stand at [facility] A/Mod of 2 and transferred to wheelchair in same manner, able to control descent into wheelchair. Self feeding at wheelchair level with supervision, cues for pacing and taking smaller bites. NMR for LUE with visual feed back for shoulder raises, shoulder rolls, neck stretches, and attempted AAROM with washcloth on lap tray but no active movement at this time. Cues throughout session for sequencing and problem solving, incorporating LUE into all ADLs and weight bearing when possible. Pt up in chair with alarm on, call bell in reach, all needs met.   Therapy Documentation Precautions:  Precautions Precautions: Fall Precaution Comments: L hemi, L inattention, pusher Restrictions Weight Bearing  Restrictions: No    Therapy/Group: Individual Therapy  Viona Gilmore 08/01/2019, 9:01 AM

## 2019-08-01 NOTE — Progress Notes (Signed)
Physical Therapy Session Note  Patient Details  Name: [name] Cruz MRN: [id number] Date of Birth: 1955/05/15  Today's Date: 08/01/2019 PT Individual Time: 1400-1500 PT Individual Time Calculation (min): 60 min   Short Term Goals: Week 1:  PT Short Term Goal 1 (Week 1): Pt will complete bed mobilty with hospital bed features & mod assist. PT Short Term Goal 2 (Week 1): Pt will complete bed<>w/c with LRAD & max assist +1. PT Short Term Goal 3 (Week 1): Pt will initiate gait training. PT Short Term Goal 4 (Week 1): Pt will demonstrate sitting balance x 5 minutes with supervision. Week 2:     Skilled Therapeutic Interventions/Progress Updates:     PAIN  Denies pain Pt initially supine and eager to participate in session.  Able to lift RLE, AAROM LLE for threading pands by therapist.  Esmond Camper r w/mod to max assist and cues, L w/max assist for raising of pants by therapist.    Side to sit w/max assist from L, visual and tactile cues for midline.  Pt pushes mildly w/RUE, leans post L. Worked on sitting balance/midline orientation via reaching to R and forward, delayed processing, apraxia, and motor planning issues noted w/task.    STS from bed w/max assist, pushes w/RUE. SPT to wc mod assist of 1, multimodal cues, pt w/significant motor planning deficit noted, pushing away from transfer direction to R w/R, decreased L hip/knee control w/poor glut activation causing flexion/retraction L hip w/transiition.    Pt transported to hallway for further standing balance, initiation of gait training.    STS using R hallway rail, mirror for feedback, multimodal cues to initiatiate/promote upright.  Initially gripping rail and pushing strongly w/RUE but this tendency decreased during upright activity.   Worked on static midline, mirror not found to be very useful, verbal/tactile cues more effective for decreasing pushing, second person assist for managing RUE positioining to deter.  Gait as  follows: 50ft using hallway rail on R w/second person advancing R hand on wall. Overall Mod assist of therapist for wt shifting to R , pt w/scissoring R LE w/advancement, therapist manually assists w/foot position at initial contact away from midline, loading thru midstance pt w/poor ability to advance L hip over knee/ankle and hip retracts/flexes, also tendency toward HE of L knee w/loading but able to control knee w/tactile cueing, mod to max assist for stabilizing/advancing hip thru stance.  Cues to increase step length on R to facilitate trailing limb on L.  Repeated gait w/HHA/mod of 2 x 24ft, deviations as above.  At end of session, pt transported back to room. Pt left oob in wc w/alarm belt set and needs in reach Sister at bedside.  Therapy Documentation Precautions:  Precautions Precautions: Fall Precaution Comments: L hemi, L inattention, pusher Restrictions Weight Bearing Restrictions: No    Therapy/Group: Individual Therapy  Rada Hay, PT   Shearon Balo 08/01/2019, 4:26 PM

## 2019-08-01 NOTE — Progress Notes (Signed)
[facility] PHYSICAL MEDICINE & REHABILITATION PROGRESS NOTE   Subjective/Complaints: Slept well. No new complaints. Denies pain  ROS: Patient denies fever, rash, sore throat, blurred vision, nausea, vomiting, diarrhea, cough, shortness of breath or chest pain, joint or back pain, headache, or mood change.      Objective:   No results found. No results for input(s): WBC, HGB, HCT, PLT in the last 72 hours. No results for input(s): NA, K, CL, CO2, GLUCOSE, BUN, CREATININE, CALCIUM in the last 72 hours.  Intake/Output Summary (Last 24 hours) at 08/01/2019 0840 Last data filed at 07/31/2019 2145 Gross per 24 hour  Intake 960 ml  Output --  Net 960 ml     Physical Exam: Vital Signs Blood pressure 132/80, pulse 66, temperature 98.3 F (36.8 C), resp. rate 16, height 5\' 3"  (1.6 m), weight 78.3 kg, SpO2 98 %.  Constitutional: No distress . Vital signs reviewed. HEENT: EOMI, oral membranes moist Neck: supple Cardiovascular: RRR without murmur. No JVD    Respiratory/Chest: CTA Bilaterally without wheezes or rales. Normal effort    GI/Abdomen: BS +, non-tender, non-distended Ext: no clubbing, cyanosis, or edema Psych: pleasant and cooperative Skin: decr erythema and tenderness along dorsum of left hand Neuro: Pt is cognitively appropriate with reasonable insight, memory, and awareness.   Sensory exam is normal. Reflexes are 2+ in all 4's. Fine motor coordination is intact. No tremors. Motor function is grossly 5/5 RUE/RLE. LUE 0/5 LLE 1+/5. Sensation tr-1/2 left side. Left central 7 Musculoskeletal: Full ROM, No pain with AROM or PROM in the neck, trunk, or extremities. Posture appropriate        Assessment/Plan: 1. Functional deficits secondary to right thalamocapsular hemorrhage which require 3+ hours per day of interdisciplinary therapy in a comprehensive inpatient rehab setting.  Physiatrist is providing close team supervision and 24 hour management of active medical problems  listed below.  Physiatrist and rehab team continue to assess barriers to discharge/monitor patient progress toward functional and medical goals  Care Tool:  Bathing    Body parts bathed by patient: Left arm, Chest, Abdomen, Right upper leg, Face   Body parts bathed by helper: Right arm, Front perineal area, Buttocks, Left upper leg, Right lower leg     Bathing assist Assist Level: 2 Helpers     Upper Body Dressing/Undressing Upper body dressing   What is the patient wearing?: Pull over shirt    Upper body assist Assist Level: Maximal Assistance - Patient 25 - 49%    Lower Body Dressing/Undressing Lower body dressing      What is the patient wearing?: Incontinence brief     Lower body assist Assist for lower body dressing: Maximal Assistance - Patient 25 - 49%     Toileting Toileting Toileting Activity did not occur (Clothing management and hygiene only): N/A (no void or bm)  Toileting assist Assist for toileting: Dependent - Patient 0%     Transfers Chair/bed transfer  Transfers assist  Chair/bed transfer activity did not occur: Safety/medical concerns  Chair/bed transfer assist level: 2 Helpers     Locomotion Ambulation   Ambulation assist   Ambulation activity did not occur: Safety/medical concerns          Walk 10 feet activity   Assist  Walk 10 feet activity did not occur: Safety/medical concerns        Walk 50 feet activity   Assist Walk 50 feet with 2 turns activity did not occur: Safety/medical concerns  Walk 150 feet activity   Assist Walk 150 feet activity did not occur: Safety/medical concerns         Walk 10 feet on uneven surface  activity   Assist Walk 10 feet on uneven surfaces activity did not occur: Safety/medical concerns         Wheelchair     Assist Will patient use wheelchair at discharge?: Yes Type of Wheelchair: Manual Wheelchair activity did not occur: Safety/medical  concerns  Wheelchair assist level: Maximal Assistance - Patient 25 - 49% Max wheelchair distance: 150 ft    Wheelchair 50 feet with 2 turns activity    Assist    Wheelchair 50 feet with 2 turns activity did not occur: Safety/medical concerns   Assist Level: Maximal Assistance - Patient 25 - 49%   Wheelchair 150 feet activity     Assist  Wheelchair 150 feet activity did not occur: Safety/medical concerns   Assist Level: Maximal Assistance - Patient 25 - 49%   Blood pressure 132/80, pulse 66, temperature 98.3 F (36.8 C), resp. rate 16, height 5\' 3"  (1.6 m), weight 78.3 kg, SpO2 98 %.  Medical Problem List and Plan: 1.Left side weakness/dysarthriasecondary to rightthalamocapsularhemorrhage most likely related to hypertensive crisis as well as old left temporal parietal infarction with small vessel disease -patient may shower -ELOS/Goals: 3+ days, min assist goals  -WHO LUE, PRAFO  --Continue CIR therapies including PT, OT, and SLP  2. Antithrombotics: -DVT/anticoagulation:SCD -antiplatelet therapy: N/A 3. Pain Management:Tylenol as needed 4. Mood:Zoloft 25 mg daily -antipsychotic agents: Geodon 40 mg nightly -mood well controlled 5. Neuropsych: This patientis notcapable of making decisions on hisown behalf. 6. Skin/Wound Care:local care to hand 7. Fluids/Electrolytes/Nutrition:. -encourage fluids/po, intake picking up  -protein supp  -check labs Monday 7/19 8. Dysphagia. Dysphagia #3 nectar thick liquids. Advance perspeech therapy   9. History of seizure disorder. Vimpat 200 mg twice daily, Keppra 1500 mg twice daily, Depakote 500 mg twice daily.  10. Hypertension. Coreg 3.125 mg twice daily.  Controlled at present 11. PAF. Cardiac rate controlled, regular currently -No anticoagulationdue to history of left MCA infarction with hemorrhagic conversion 12.  Hyperlipidemia. Resumed Lipitor   13. Obesity. BMI 30. Dietary follow-up 14. CAD with CABG 2019. No chest pain or shortness of breath. 15. Mild thrombocytopenia: 161, follow up cbc Monday 7/19 16 Neurogenic bladder: pvr's better  - I/O cath prn, monitor PVR's  -OOB to void  7/17 -ua is equivocal, ucx 80k staph epi--S to macrodantin--->5 days    LOS: 6 days A FACE TO FACE EVALUATION WAS PERFORMED  8/17 08/01/2019, 8:40 AM

## 2019-08-01 NOTE — Plan of Care (Signed)
  Problem: Consults Goal: RH GENERAL PATIENT EDUCATION Description: See Patient Education module for education specifics. Outcome: Progressing Goal: Skin Care Protocol Initiated - if Braden Score 18 or less Description: If consults are not indicated, leave blank or document N/A Outcome: Progressing Goal: Nutrition Consult-if indicated Outcome: Progressing   Problem: RH BOWEL ELIMINATION Goal: RH STG MANAGE BOWEL WITH ASSISTANCE Description: STG Manage Bowel with mod I Assistance. Outcome: Progressing Goal: RH STG MANAGE BOWEL W/MEDICATION W/ASSISTANCE Description: STG Manage Bowel with Medication with mod I Assistance. Outcome: Progressing   Problem: RH BLADDER ELIMINATION Goal: RH STG MANAGE BLADDER WITH ASSISTANCE Description: STG Manage Bladder With min Assistance Outcome: Progressing Goal: RH STG MANAGE BLADDER WITH EQUIPMENT WITH ASSISTANCE Description: STG Manage Bladder With Equipment With min Assistance Outcome: Progressing   Problem: RH SAFETY Goal: RH STG ADHERE TO SAFETY PRECAUTIONS W/ASSISTANCE/DEVICE Description: STG Adhere to Safety Precautions With cues and reminders Outcome: Progressing Goal: RH STG DECREASED RISK OF FALL WITH ASSISTANCE Description: STG Decreased Risk of Fall With cues and reminders  Outcome: Progressing   Problem: RH PAIN MANAGEMENT Goal: RH STG PAIN MANAGED AT OR BELOW PT'S PAIN GOAL Description: Less than 4 on scale of 0-10 Outcome: Progressing   Problem: Consults Goal: RH STROKE PATIENT EDUCATION Description: See Patient Education module for education specifics  Outcome: Progressing   

## 2019-08-02 ENCOUNTER — Inpatient Hospital Stay (HOSPITAL_COMMUNITY): Payer: Medicare Other | Admitting: Occupational Therapy

## 2019-08-02 ENCOUNTER — Inpatient Hospital Stay (HOSPITAL_COMMUNITY): Payer: Medicare Other | Admitting: Physical Therapy

## 2019-08-02 ENCOUNTER — Inpatient Hospital Stay (HOSPITAL_COMMUNITY): Payer: Medicare Other | Admitting: Speech Pathology

## 2019-08-02 LAB — CBC
HCT: 38.2 % — ABNORMAL LOW (ref 39.0–52.0)
Hemoglobin: 12.5 g/dL — ABNORMAL LOW (ref 13.0–17.0)
MCH: 30.6 pg (ref 26.0–34.0)
MCHC: 32.7 g/dL (ref 30.0–36.0)
MCV: 93.4 fL (ref 80.0–100.0)
Platelets: 314 10*3/uL (ref 150–400)
RBC: 4.09 MIL/uL — ABNORMAL LOW (ref 4.22–5.81)
RDW: 12.3 % (ref 11.5–15.5)
WBC: 9.9 10*3/uL (ref 4.0–10.5)
nRBC: 0 % (ref 0.0–0.2)

## 2019-08-02 LAB — BASIC METABOLIC PANEL
Anion gap: 11 (ref 5–15)
BUN: 11 mg/dL (ref 8–23)
CO2: 27 mmol/L (ref 22–32)
Calcium: 8.9 mg/dL (ref 8.9–10.3)
Chloride: 100 mmol/L (ref 98–111)
Creatinine, Ser: 0.59 mg/dL — ABNORMAL LOW (ref 0.61–1.24)
GFR calc Af Amer: >60 mL/min (ref >60)
GFR calc non Af Amer: >60 mL/min (ref >60)
Glucose, Bld: 103 mg/dL — ABNORMAL HIGH (ref 70–99)
Potassium: 3.8 mmol/L (ref 3.5–5.1)
Sodium: 138 mmol/L (ref 135–145)

## 2019-08-02 MED ORDER — TAMSULOSIN HCL 0.4 MG PO CAPS
0.4000 mg | ORAL_CAPSULE | Freq: Every day | ORAL | Status: DC
  Administered 2019-08-02 – 2019-08-16 (×15): 0.4 mg via ORAL
  Filled 2019-08-02 (×15): qty 1

## 2019-08-02 MED ORDER — LIDOCAINE HCL URETHRAL/MUCOSAL 2 % EX GEL
CUTANEOUS | Status: DC | PRN
  Filled 2019-08-02 (×5): qty 5

## 2019-08-02 NOTE — Progress Notes (Signed)
[facility] PHYSICAL MEDICINE & REHABILITATION PROGRESS NOTE   Subjective/Complaints:   Pt reports OK- said he had a BM and reports no issues- denies pain.   Saw note from nursing- didn't void all day- attempted to cath- unable to, even with coude cathter- started on Flomax and try to cath again.    ROS:  Pt denies SOB, abd pain, CP, N/V/C/D, and vision changes   Objective:   No results found. Recent Labs    08/02/19 1259  WBC 9.9  HGB 12.5*  HCT 38.2*  PLT 314   Recent Labs    08/02/19 1259  NA 138  K 3.8  CL 100  CO2 27  GLUCOSE 103*  BUN 11  CREATININE 0.59*  CALCIUM 8.9    Intake/Output Summary (Last 24 hours) at 08/02/2019 1929 Last data filed at 08/02/2019 1900 Gross per 24 hour  Intake 952 ml  Output 650 ml  Net 302 ml     Physical Exam: Vital Signs Blood pressure 129/84, pulse 86, temperature 97.7 F (36.5 C), temperature source Oral, resp. rate 18, height 5\' 3"  (1.6 m), weight 78.3 kg, SpO2 97 %.  Constitutional: No distress . Vital signs reviewed.sitting up in bed- watching TV, NAD HEENT: EOMI, oral membranes moist Neck: supple Cardiovascular: RRR    Respiratory/Chest: CTA B/L- no W/R/R- good air movement   GI/Abdomen: Soft, NT, ND, (+)BS  Ext: no clubbing, cyanosis, or edema Psych: told me no issues and to leave him alone Skin: decr erythema and tenderness along dorsum of left hand Neuro: Pt is cognitively appropriate with reasonable insight, memory, and awareness.   Sensory exam is normal. Reflexes are 2+ in all 4's. Fine motor coordination is intact. No tremors. Motor function is grossly 5/5 RUE/RLE. LUE 0/5 LLE 1+/5. Sensation tr-1/2 left side. Left central 7 Musculoskeletal: Full ROM, No pain with AROM or PROM in the neck, trunk, or extremities. Posture appropriate        Assessment/Plan: 1. Functional deficits secondary to right thalamocapsular hemorrhage which require 3+ hours per day of interdisciplinary therapy in a comprehensive  inpatient rehab setting.  Physiatrist is providing close team supervision and 24 hour management of active medical problems listed below.  Physiatrist and rehab team continue to assess barriers to discharge/monitor patient progress toward functional and medical goals  Care Tool:  Bathing    Body parts bathed by patient: Left arm, Chest, Abdomen, Right upper leg, Face   Body parts bathed by helper: Right arm, Front perineal area, Buttocks, Left upper leg, Right lower leg     Bathing assist Assist Level: 2 Helpers     Upper Body Dressing/Undressing Upper body dressing   What is the patient wearing?: Pull over shirt    Upper body assist Assist Level: Moderate Assistance - Patient 50 - 74%    Lower Body Dressing/Undressing Lower body dressing      What is the patient wearing?: Incontinence brief     Lower body assist Assist for lower body dressing: Total Assistance - Patient < 25%     Toileting Toileting Toileting Activity did not occur and hygiene only): N/A (no void or bm)  Toileting assist Assist for toileting: Dependent - Patient 0%     Transfers Chair/bed transfer  Transfers assist  Chair/bed transfer activity did not occur: Safety/medical concerns  Chair/bed transfer assist level: Maximal Assistance - Patient 25 - 49%     Locomotion Ambulation   Ambulation assist   Ambulation activity did not occur: Safety/medical  concerns  Assist level: 2 helpers Assistive device:  (rail, hemiwalker) Max distance: 20 ft   Walk 10 feet activity   Assist  Walk 10 feet activity did not occur: Safety/medical concerns  Assist level: 2 helpers Assistive device:  (rail, hemiwalker)   Walk 50 feet activity   Assist Walk 50 feet with 2 turns activity did not occur: Safety/medical concerns  Assist level: 2 helpers Assistive device: Hand held assist    Walk 150 feet activity   Assist Walk 150 feet activity did not occur: Safety/medical  concerns         Walk 10 feet on uneven surface  activity   Assist Walk 10 feet on uneven surfaces activity did not occur: Safety/medical concerns         Wheelchair     Assist Will patient use wheelchair at discharge?: Yes Type of Wheelchair: Manual Wheelchair activity did not occur: Safety/medical concerns  Wheelchair assist level: Minimal Assistance - Patient > 75% Max wheelchair distance: >100 ft    Wheelchair 50 feet with 2 turns activity    Assist    Wheelchair 50 feet with 2 turns activity did not occur: Safety/medical concerns   Assist Level: Minimal Assistance - Patient > 75%   Wheelchair 150 feet activity     Assist  Wheelchair 150 feet activity did not occur: Safety/medical concerns   Assist Level: Maximal Assistance - Patient 25 - 49%   Blood pressure 129/84, pulse 86, temperature 97.7 F (36.5 C), temperature source Oral, resp. rate 18, height 5\' 3"  (1.6 m), weight 78.3 kg, SpO2 97 %.  Medical Problem List and Plan: 1.Left side weakness/dysarthriasecondary to rightthalamocapsularhemorrhage most likely related to hypertensive crisis as well as old left temporal parietal infarction with small vessel disease -patient may shower -ELOS/Goals: 3+ days, min assist goals  -WHO LUE, PRAFO  --Continue CIR therapies including PT, OT, and SLP  2. Antithrombotics: -DVT/anticoagulation:SCD -antiplatelet therapy: N/A 3. Pain Management:Tylenol as needed  7/19- pain well controlled- con't tylneol prn 4. Mood:Zoloft 25 mg daily -antipsychotic agents: Geodon 40 mg nightly -mood well controlled 5. Neuropsych: This patientis notcapable of making decisions on hisown behalf. 6. Skin/Wound Care:local care to hand 7. Fluids/Electrolytes/Nutrition:. -encourage fluids/po, intake picking up  -protein supp  -check labs Monday 7/19 8. Dysphagia. Dysphagia #3 nectar thick  liquids. Advance perspeech therapy   9. History of seizure disorder. Vimpat 200 mg twice daily, Keppra 1500 mg twice daily, Depakote 500 mg twice daily.  10. Hypertension. Coreg 3.125 mg twice daily.  Controlled at present  7/19- BP controlled- con't regimen 11. PAF. Cardiac rate controlled, regular currently -No anticoagulationdue to history of left MCA infarction with hemorrhagic conversion 12. Hyperlipidemia. Resumed Lipitor   13. Obesity. BMI 30. Dietary follow-up 14. CAD with CABG 2019. No chest pain or shortness of breath. 15. Mild thrombocytopenia: 161, follow up cbc Monday 7/19 16 Neurogenic bladder: pvr's better  - I/O cath prn, monitor PVR's  -OOB to void  7/17 -ua is equivocal, ucx 80k staph epi--S to macrodantin--->5 days  7/19- not voiding- flomax was started this evening- will likely require in/out vs Foley placement if cannot get in/out cath in.      LOS: 7 days A FACE TO FACE EVALUATION WAS PERFORMED  [name] Cruz 08/02/2019, 7:29 PM

## 2019-08-02 NOTE — Progress Notes (Signed)
Speech Language Pathology Daily Session Note  Patient Details  Name: [name] Cruz MRN: [id number] Date of Birth: 1955-05-22  Today's Date: 08/02/2019 SLP Individual Time: 0827-0925 SLP Individual Time Calculation (min): 58 min  Short Term Goals: Week 1: SLP Short Term Goal 1 (Week 1): Patient will utilize speech intelligibility strategies at the sentence level to achieve ~90% intelligibility with supervision verbal cues. SLP Short Term Goal 2 (Week 1): Patient will consume current diet with minimal overt s/s of aspiration with overall supervision verbal cues for use of swallowing compensatory strategies. SLP Short Term Goal 3 (Week 1): Patient will consume trials of thin liquids with minimal overt s/s of aspiration over 2 sessions to assess readiness for repeat MBS. SLP Short Term Goal 4 (Week 1): Patient will demonstrate functional problem solving for basic and familiar tasks with Min A verbal cues. SLP Short Term Goal 5 (Week 1): Patient will attend to left field of enviornment during functional tasks with Min A verbal cues. SLP Short Term Goal 6 (Week 1): Patient will recall new, daily information with Min A verbal cues.  Skilled Therapeutic Interventions: Pt was seen for skilled ST targeting dysphagia and cognition. When completing oral care and bed mobility for repositioning, pt required Mod A verbal cues for functional sequencing and following 1-step directions during tasks. When consuming trials of ice chips and thin H2O, pt required frequent cues for recall as to purpose of activity and sustained attention to task. However, no overt s/sx aspiration noted across intake of 7 ice chips and ~3 oz H2O. Pt required Max A multimodal cues for verbal problem solving and interpretation of a basic weekly weather forecast task. Although he could demonstrate how to utilize soft call bell and recall purpose was to "get help", multiple choice cues were required for pt to identify 2 situations in which he  would need to request help (ex: go to bathroom, get refreshments, etc.). Pt left laying in bed with alarm set and needs within reach. Continue per current plan of care.       Pain Pain Assessment Pain Scale: 0-10 Pain Score: 0-No pain  Therapy/Group: Individual Therapy  Little Ishikawa 08/02/2019, 7:16 AM

## 2019-08-02 NOTE — Plan of Care (Signed)
  Problem: Consults Goal: RH GENERAL PATIENT EDUCATION Description: See Patient Education module for education specifics. Outcome: Progressing Goal: Skin Care Protocol Initiated - if Braden Score 18 or less Description: If consults are not indicated, leave blank or document N/A Outcome: Progressing Goal: Nutrition Consult-if indicated Outcome: Progressing   Problem: RH BOWEL ELIMINATION Goal: RH STG MANAGE BOWEL WITH ASSISTANCE Description: STG Manage Bowel with mod I Assistance. Outcome: Progressing Flowsheets (Taken 08/02/2019 1445) STG: Pt will manage bowels with assistance: 1-Total assistance Goal: RH STG MANAGE BOWEL W/MEDICATION W/ASSISTANCE Description: STG Manage Bowel with Medication with mod I Assistance. Outcome: Progressing Flowsheets (Taken 08/02/2019 1445) STG: Pt will manage bowels with medication with assistance: 2-Maximum assistance   Problem: RH BLADDER ELIMINATION Goal: RH STG MANAGE BLADDER WITH ASSISTANCE Description: STG Manage Bladder With min Assistance Outcome: Progressing Flowsheets (Taken 08/02/2019 1445) STG: Pt will manage bladder with assistance: 2-Maximum assistance Goal: RH STG MANAGE BLADDER WITH EQUIPMENT WITH ASSISTANCE Description: STG Manage Bladder With Equipment With min Assistance Outcome: Progressing Flowsheets (Taken 08/02/2019 1445) STG: Pt will manage bladder with equipment with assistance: 2-Maximum assistance   Problem: RH SAFETY Goal: RH STG ADHERE TO SAFETY PRECAUTIONS W/ASSISTANCE/DEVICE Description: STG Adhere to Safety Precautions With cues and reminders Outcome: Progressing Flowsheets (Taken 08/02/2019 1445) STG:Pt will adhere to safety precautions with assistance/device: 2-Maximum assistance Goal: RH STG DECREASED RISK OF FALL WITH ASSISTANCE Description: STG Decreased Risk of Fall With cues and reminders  Outcome: Progressing Flowsheets (Taken 08/02/2019 1445) OAC:[id number] risk of fall  with assistance/device: 2-Maximum  assistance   Problem: RH PAIN MANAGEMENT Goal: RH STG PAIN MANAGED AT OR BELOW PT'S PAIN GOAL Description: Less than 4 on scale of 0-10 Outcome: Progressing   Problem: Consults Goal: RH STROKE PATIENT EDUCATION Description: See Patient Education module for education specifics  Outcome: Progressing

## 2019-08-02 NOTE — Progress Notes (Signed)
Occupational Therapy Weekly Progress Note  Patient Details  Name: [name] Cruz MRN: [id number] Date of Birth: 1955-12-09  Beginning of progress report period: July 27, 2019 End of progress report period: August 02, 2019  Today's Date: 08/02/2019 OT Individual Time: 1045-1200 OT Individual Time Calculation (min): 75 min    Patient has met 1 of 3 short term goals.  Pt is making slow, but steady progress towards OT goals. Pt currently still requires +2 assist for stand-pivot transfers with difficulty weight shifting and motor planning pivot. Pt also with poor awareness of L side, L hemiplegia as well as pusher syndrome affecting BADL tasks. Pt with some improved initiation within BADL tasks, but still needs cues for sequencing and task termination. Continue current POC.  Patient continues to demonstrate the following deficits: muscle weakness, decreased cardiorespiratoy endurance, impaired timing and sequencing, abnormal tone, unbalanced muscle activation, motor apraxia, ataxia, decreased coordination and decreased motor planning, decreased midline orientation, decreased attention to left, left side neglect and ideational apraxia, decreased initiation, decreased attention, decreased awareness, decreased problem solving, decreased safety awareness, decreased memory and delayed processing and decreased sitting balance, decreased standing balance, decreased postural control, hemiplegia and decreased balance strategies and therefore will continue to benefit from skilled OT intervention to enhance overall performance with BADL and Reduce care partner burden.  Patient progressing toward long term goals..  Continue plan of care.  OT Short Term Goals Week 1:  OT Short Term Goal 1 (Week 1): Pt will complete toilet transfer with max A of 1 OT Short Term Goal 1 - Progress (Week 1): Progressing toward goal OT Short Term Goal 2 (Week 1): Pt will maintain sitting balance at EOB with no more than min A within  BADL tasks OT Short Term Goal 2 - Progress (Week 1): Progressing toward goal OT Short Term Goal 3 (Week 1): Pt will wash upper body with no more than mod verbal cues to move onto next body part OT Short Term Goal 3 - Progress (Week 1): Met Week 2:  OT Short Term Goal 1 (Week 2): Pt will demonstrate improved awareness of L UE by placing L arm into shirt sleeve with mod verbal cues OT Short Term Goal 2 (Week 2): Pt will complete sit<>stand with max A of 1 in preparation for BADL tasks OT Short Term Goal 3 (Week 2): Pt will maintain sitting balance seated EOB with no more than mod A for 3 minutes in preparation for BADL task.  Skilled Therapeutic Interventions/Progress Updates:    Pt greeted seated in wc with nurse techs present. Nurse techs transferred pt to wc using Stedy. Pt pushing more to the L sitting in wc today. Had pt reach up and overhead to the R to collect wash cloth and items for bathing to decrease pushing. OT also used NDT techniques sitting in wc to promote more neutral trunk positioning. UB bathing at the sink with hand over hand A to integrate L UE into bathing tasks. Some increased tone noted in L UE making it more difficult to stretch L UE to R underarm. No volitional activation noted. Shaving completed at the sink using electric razor. Verbal cues to attend to L side of face and cues to terminate task. UB dressing with max cues to bring attention to L hand and have pt pick up L UE with R to place inside shirt. Pt then able to sequence placing R UE in shift and pulling overhead, but needed assist to complete. OT thread L UE  into pants, then pt was able to lean forward and to the R to thread his R leg! Max A sit<>stand with +2 needed to help pull pants up over hips. Pt brought to therapy gym and completed stand-pivot from wc to mat with Max A +2 and verbal and tactile cues to facilitate pivot once powered up. Focus on sitting balance on mat and decreased pushing. Used mirror feedback to help  bring pt to midline. OT placed stickey notes up and to the R and had pt reach for these to decrease pushing. OT used NDT techniques to help facilitate trunk elongation/shortening when reaching. Max+2 stand-pivot back to wc with pt trying to sit prior to being at wc requiring max+2 to safely sit in wc. OT placed SAEBO e-stim on wrist extensors. SAEBO left on for 60 minutes. OT returned to remove SAEBO with skin intact and no adverse reactions.  Saebo Stim One 330 pulse width 35 Hz pulse rate On 8 sec/ off 8 sec Ramp up/ down 2 sec Symmetrical Biphasic wave form  Max intensity 151m at 500 Ohm load  Pt left seated in wc with chair alarm on, call bell in reach, and needs met.   Therapy Documentation Precautions:  Precautions Precautions: Fall Precaution Comments: L hemi, L inattention, pusher Restrictions Weight Bearing Restrictions: No Pain: Pain Assessment Pain Scale: 0-10 Pain Score: 0-No pain   Therapy/Group: Individual Therapy  [name]7/19/2021, 12:46 PM

## 2019-08-02 NOTE — Progress Notes (Signed)
Physical Therapy Session Note  Patient Details  Name: [name] Cruz MRN: [id number] Date of Birth: [birth year]  Today's Date: 08/02/2019 PT Individual Time: 0076-2263 PT Individual Time Calculation (min): 53 min   Short Term Goals: Week 1:  PT Short Term Goal 1 (Week 1): Pt will complete bed mobilty with hospital bed features & mod assist. PT Short Term Goal 2 (Week 1): Pt will complete bed<>w/c with LRAD & max assist +1. PT Short Term Goal 3 (Week 1): Pt will initiate gait training. PT Short Term Goal 4 (Week 1): Pt will demonstrate sitting balance x 5 minutes with supervision.  Skilled Therapeutic Interventions/Progress Updates:  Pt received in w/c & agreeable to tx. Transported pt to/from gym via w/c dependent assist for time management. Sit<>stand from w/c with max assist. Gait with rail in hallway x 20 ft, with hemiwalker x 15 ft with 2nd person to provide w/c follow and assist with hemiwalker advancement PRN. Utilized [employer] for [employer], but pt with minimal use of visual aid. During gait pt requires max assist to weight shift R to allow increased ease of L foot advancement, unsure if pt demonstrates LLE extensor tone or difficulty motor planning to flex L hip/knee during swing phase. Also utilized shoe cover to increase ease of L foot advancement. Pt continues to demonstrate strong L lateral lean during gait. Transitioned to sitting on EOM & pt with worsening LOB to L with significantly impaired awareness. Pt engaged in picking cones off floor to focus on R/anterior weight shifting then placing them to R with therapist facilitating increased weight shift to R hip in sitting. Progressed to standing with pt reaching overhead to R to retreive & place cups with pt demonstrating ongoing mild<>moderate L lateral lean & posterior LOB. Provided pt with RW & L hand orthosis and attempted to assist pt with sit<>stand and standing in center of RW with use of mirror for visual feedback. Pt continues  to demonstrate pushing L with RUE/RLE. Pt also presenting with tremor in RUE. Pt assisted back to w/c with pt attempting squat pivot mat>w/c but impaired safety awareness & requiring +2 to fully pivot & scoot back in w/c seat. Nurse made aware of tremor in RUE, & sister who was in room at end of session reports this is not pt's baseline. Pt left in w/c with chair alarm donned, call bell in lap, & sister in room.  Therapy Documentation Precautions:  Precautions Precautions: Fall Precaution Comments: L hemi, L inattention, pusher Restrictions Weight Bearing Restrictions: No  Pain: No c/o or behaviors demonstrating pain.   Therapy/Group: Individual Therapy  TRASON SHIFFLET 08/02/2019, 4:16 PM

## 2019-08-02 NOTE — Progress Notes (Signed)
Occupational Therapy Session Note  Patient Details  Name: [name] Cruz MRN: [id number] Date of Birth: 1955/12/04  Today's Date: 08/02/2019 OT Individual Time: 1300-1325 OT Individual Time Calculation (min): 25 min    Short Term Goals: Week 2:  OT Short Term Goal 1 (Week 2): Pt will demonstrate improved awareness of L UE by placing L arm into shirt sleeve with mod verbal cues OT Short Term Goal 2 (Week 2): Pt will complete sit<>stand with max A of 1 in preparation for BADL tasks OT Short Term Goal 3 (Week 2): Pt will maintain sitting balance seated EOB with no more than mod A for 3 minutes in preparation for BADL task.  Skilled Therapeutic Interventions/Progress Updates:    Pt greeted at time of session sitting up in wheelchair agreeable to OT session, pleasant and cooperative no c/o pain throughout. Sit to stand with Stedy with R hand at midline position on rail with Mod/Max to stand and performed dynamic standing activity in [place] for matching card game, approx 25-50% accuracy with matching. Repositioning at midline throughout standing activity having the pt reach to the R to weight shift away from L side d/t pushing. Encouraged attending to LUE by having it positioned on rail as well, total assist to keep it on the rail d/t flaccid UE. Pt able to assist with controlling descent into wheelchair, positioned with half lap tray and pillow for elevation to decrease swelling. Call bell in reach, alarm on.   Therapy Documentation Precautions:  Precautions Precautions: Fall Precaution Comments: L hemi, L inattention, pusher Restrictions Weight Bearing Restrictions: No    Therapy/Group: Individual Therapy  Erasmo Score 08/02/2019, 3:31 PM

## 2019-08-02 NOTE — Progress Notes (Signed)
Patient did not void today at all ,he was scaned twice with the results on 200 cc.I and the charge nurse attempt to do a in and out catheterization using a coude cath, but we were not successful. PA was informed and got new orders. She starts him on Flomax , and after give him some time, to scan and try to cath him again.

## 2019-08-03 ENCOUNTER — Inpatient Hospital Stay (HOSPITAL_COMMUNITY): Payer: Medicare Other | Admitting: Occupational Therapy

## 2019-08-03 ENCOUNTER — Inpatient Hospital Stay (HOSPITAL_COMMUNITY): Payer: Medicare Other | Admitting: Physical Therapy

## 2019-08-03 ENCOUNTER — Inpatient Hospital Stay (HOSPITAL_COMMUNITY): Payer: Medicare Other | Admitting: Speech Pathology

## 2019-08-03 NOTE — Progress Notes (Signed)
Speech Language Pathology Daily Session Note  Patient Details  Name: [name] Cruz MRN: [id number] Date of Birth: [birth year]  Today's Date: 08/03/2019 SLP Individual Time: 1110-1145 SLP Individual Time Calculation (min): 35 min  Short Term Goals: Week 1: SLP Short Term Goal 1 (Week 1): Patient will utilize speech intelligibility strategies at the sentence level to achieve ~90% intelligibility with supervision verbal cues. SLP Short Term Goal 2 (Week 1): Patient will consume current diet with minimal overt s/s of aspiration with overall supervision verbal cues for use of swallowing compensatory strategies. SLP Short Term Goal 3 (Week 1): Patient will consume trials of thin liquids with minimal overt s/s of aspiration over 2 sessions to assess readiness for repeat MBS. SLP Short Term Goal 4 (Week 1): Patient will demonstrate functional problem solving for basic and familiar tasks with Min A verbal cues. SLP Short Term Goal 5 (Week 1): Patient will attend to left field of enviornment during functional tasks with Min A verbal cues. SLP Short Term Goal 6 (Week 1): Patient will recall new, daily information with Min A verbal cues.  Skilled Therapeutic Interventions: Skilled treatment session focused on dysphagia goals and ongoing diagnostic treatment of language function. SLP facilitated session by providing trials of ice chips and thin liquids via tsp after oral care. Patient did not demonstrate any overt s/s of aspiration but did "swish" thin liquids around in his oral cavity prior to swallowing for more than a reasonable amount of time despite Max multimodal cues. Recommend ongoing trials with SLP. SLP also facilitated session with a structured language task. Patient could name functional/ basic pictures of objects with 30% accuracy independently which improved to 50% accuracy with Max A multimodal cues. Patient's verbal expression appeared to improve during an informal conversation, however,  verbalizations are simple and requires extra time. Goals will be added to address language and functional communication. Discussed with patient, he verbalized understanding. Patient left upright in wheelchair with alarm on and all needs within reach. Continue with current plan of care.      Pain Pain Assessment Pain Scale: 0-10 Pain Score: 0-No pain  Therapy/Group: Individual Therapy  Shaketha Jeon 08/03/2019, 3:49 PM

## 2019-08-03 NOTE — Progress Notes (Signed)
[facility] PHYSICAL MEDICINE & REHABILITATION PROGRESS NOTE   Subjective/Complaints:   Up in chair. Left arm with swelling. Denies pain. No complaints  ROS: Patient denies fever, rash, sore throat, blurred vision, nausea, vomiting, diarrhea, cough, shortness of breath or chest pain, joint or back pain, headache, or mood change.    Objective:   No results found. Recent Labs    08/02/19 1259  WBC 9.9  HGB 12.5*  HCT 38.2*  PLT 314   Recent Labs    08/02/19 1259  NA 138  K 3.8  CL 100  CO2 27  GLUCOSE 103*  BUN 11  CREATININE 0.59*  CALCIUM 8.9    Intake/Output Summary (Last 24 hours) at 08/03/2019 1140 Last data filed at 08/03/2019 0433 Gross per 24 hour  Intake 832 ml  Output 375 ml  Net 457 ml     Physical Exam: Vital Signs Blood pressure 113/68, pulse 67, temperature 97.7 F (36.5 C), temperature source Oral, resp. rate 18, height 5\' 3"  (1.6 m), weight 78.3 kg, SpO2 98 %.  Constitutional: No distress . Vital signs reviewed. HEENT: EOMI, oral membranes moist Neck: supple Cardiovascular: RRR without murmur. No JVD    Respiratory/Chest: CTA Bilaterally without wheezes or rales. Normal effort    GI/Abdomen: BS +, non-tender, non-distended Ext: no clubbing, cyanosis. 1+ edema LUE Psych: pleasant and cooperative Skin: skin intake, no erythema Neuro: Pt is cognitively appropriate with reasonable insight, memory, and awareness.   Sensory exam is normal. Reflexes are 2+ in all 4's. Fine motor coordination is intact. No tremors. Motor function is grossly 5/5 RUE/RLE. LUE 0/5 LLE 1+/5. Sensation tr-1/2 left side. Left central 7 Musculoskeletal: Full ROM, No pain with AROM or PROM in the neck, trunk, or extremities. Posture appropriate in sitting        Assessment/Plan: 1. Functional deficits secondary to right thalamocapsular hemorrhage which require 3+ hours per day of interdisciplinary therapy in a comprehensive inpatient rehab setting.  Physiatrist is  providing close team supervision and 24 hour management of active medical problems listed below.  Physiatrist and rehab team continue to assess barriers to discharge/monitor patient progress toward functional and medical goals  Care Tool:  Bathing    Body parts bathed by patient: Left arm, Chest, Abdomen, Right upper leg, Face   Body parts bathed by helper: Right arm, Front perineal area, Buttocks, Left upper leg, Right lower leg     Bathing assist Assist Level: 2 Helpers     Upper Body Dressing/Undressing Upper body dressing   What is the patient wearing?: Pull over shirt    Upper body assist Assist Level: Moderate Assistance - Patient 50 - 74%    Lower Body Dressing/Undressing Lower body dressing      What is the patient wearing?: Incontinence brief     Lower body assist Assist for lower body dressing: Total Assistance - Patient < 25%     Toileting Toileting Toileting Activity did not occur and hygiene only): N/A (no void or bm)  Toileting assist Assist for toileting: Dependent - Patient 0%     Transfers Chair/bed transfer  Transfers assist  Chair/bed transfer activity did not occur: Safety/medical concerns  Chair/bed transfer assist level: Maximal Assistance - Patient 25 - 49%     Locomotion Ambulation   Ambulation assist   Ambulation activity did not occur: Safety/medical concerns  Assist level: 2 helpers (max assist + 2nd person for w/c follow) Assistive device: Walker-rolling (L hand orthosis) Max distance: 40 ft  Walk 10 feet activity   Assist  Walk 10 feet activity did not occur: Safety/medical concerns  Assist level: 2 helpers (max assist + 2nd person for w/c follow) Assistive device: Walker-rolling (L hand orthosis)   Walk 50 feet activity   Assist Walk 50 feet with 2 turns activity did not occur: Safety/medical concerns  Assist level: 2 helpers Assistive device: Hand held assist    Walk 150 feet  activity   Assist Walk 150 feet activity did not occur: Safety/medical concerns         Walk 10 feet on uneven surface  activity   Assist Walk 10 feet on uneven surfaces activity did not occur: Safety/medical concerns         Wheelchair     Assist Will patient use wheelchair at discharge?: Yes Type of Wheelchair: Manual Wheelchair activity did not occur: Safety/medical concerns  Wheelchair assist level: Minimal Assistance - Patient > 75% Max wheelchair distance: 100 ft    Wheelchair 50 feet with 2 turns activity    Assist    Wheelchair 50 feet with 2 turns activity did not occur: Safety/medical concerns   Assist Level: Minimal Assistance - Patient > 75%   Wheelchair 150 feet activity     Assist  Wheelchair 150 feet activity did not occur: Safety/medical concerns   Assist Level: Maximal Assistance - Patient 25 - 49%   Blood pressure 113/68, pulse 67, temperature 97.7 F (36.5 C), temperature source Oral, resp. rate 18, height 5\' 3"  (1.6 m), weight 78.3 kg, SpO2 98 %.  Medical Problem List and Plan: 1.Left side weakness/dysarthriasecondary to rightthalamocapsularhemorrhage most likely related to hypertensive crisis as well as old left temporal parietal infarction with small vessel disease -patient may shower -ELOS/Goals: 3+ days, min assist goals  -WHO LUE, PRAFO  --Continue CIR therapies including PT, OT, and SLP  2. Antithrombotics: -DVT/anticoagulation:SCD -antiplatelet therapy: N/A 3. Pain Management:Tylenol as needed  7/19- pain well controlled- con't tylneol prn 4. Mood:Zoloft 25 mg daily -antipsychotic agents: Geodon 40 mg nightly -mood well controlled 5. Neuropsych: This patientis notcapable of making decisions on hisown behalf. 6. Skin/Wound Care:local care to hand, elevate LUE while in bed/chair 7. Fluids/Electrolytes/Nutrition:. -encourage  fluids/po, intake picking up  -protein supp  -labs reviewed from 7/19 8. Dysphagia. Dysphagia #3 nectar thick liquids. Advance perspeech therapy   9. History of seizure disorder. Vimpat 200 mg twice daily, Keppra 1500 mg twice daily, Depakote 500 mg twice daily.  10. Hypertension. Coreg 3.125 mg twice daily.  Controlled at present  7/20- BP controlled- con't regimen 11. PAF. Cardiac rate controlled, regular currently -No anticoagulationdue to history of left MCA infarction with hemorrhagic conversion 12. Hyperlipidemia. Resumed Lipitor   13. Obesity. BMI 30. Dietary follow-up 14. CAD with CABG 2019. No chest pain or shortness of breath. 15. Mild thrombocytopenia: 161, follow up cbc Monday 7/19 16 Neurogenic bladder: pvr's better  - I/O cath prn, monitor PVR's  -OOB to void  7/17 -ua is equivocal, ucx 80k staph epi--S to macrodantin--->5 days  7/20 flomax started yesterday. Some improvement in emptying   -no changes    LOS: 8 days A FACE TO FACE EVALUATION WAS PERFORMED  8/20 08/03/2019, 11:40 AM

## 2019-08-03 NOTE — Patient Care Conference (Signed)
Inpatient RehabilitationTeam Conference and Plan of Care Update Date: 08/03/2019   Time: 2:25 PM    Patient Name: [name] Cruz      Medical Record Number: [id number]  Date of Birth: 12/10/1955 Sex: Male         Room/Bed: 4M09C/4M09C-01 Payor Info: Payor: [employer]vertising copy[employer] MEDICARE / Plan: [facility] MEDICARE / Product Type: *No Product type* /    Admit Date/Time:  07/26/2019  5:03 PM  Primary Diagnosis:  Thalamic hemorrhage [facility])  Hospital Problems: Principal Problem:   Thalamic hemorrhage [facility])    Expected Discharge Date: Expected Discharge Date: 08/24/19  Team Members Present: Physician leading conference: Dr. Faith Rogue Care Coodinator Present: Cecile Sheerer, LCSWA;Kaivon Livesey Marlyne Beards, RN, BSN, CRRN Nurse Present: Regino Schultze, RN PT Present: Aleda Grana, PT OT Present: Kearney Hard, OT SLP Present: Feliberto Gottron, SLP PPS Coordinator present : Fae Pippin, SLP     Current Status/Progress Goal Weekly Team Focus  Bowel/Bladder   pt had continent episode of bladder using urinal, ppt incontinent of bowel. iBladder scan q 4-6 and cath for volumes over 350. Pt was having episodes of not being able to void. Nursing staff also unable to cath due to resistance   Less incontinence with timed toiletting   Assess toiletting q shift and prn   Swallow/Nutrition/ Hydration   Dys.3 textures with nectar-thick liquids, Min A  Mod I  trials of upgraded liquids, repeat MBS   ADL's   mod/max +2 transfers and BADL tasks  Min A overall  L UE NMR, NMES, general strengthening, L attention, decreased pushing, awareness, self-care retraining   Mobility   mod +1-2 assist overall, impaired motor planning, impaired awareness  min assist transfers & short distance gait with LRAD, supervision w/c mobility  L NMR, midline orientation, balance, transfers, awareness, cognition, gait, pt/family education, d/c planning   Communication   Supervision  Mod I  use of speech intelligibility strategies    Safety/Cognition/ Behavioral Observations  Mod-Max A  Supervision  attention to left, basic problem solving, recall of daily information   Pain   no complaints of pain  remain pain free  assess pain q shift and prn    Skin   blister on left hand, heaking with some serousenguaneious drainage  prevent any furthur break down and remain infection free   apply thin film to blister assess skin q shift and prn      Team Discussion:  Discharge Planning/Teaching Needs:  Pt to d/c to home with 24/7 care from his sister Steward Drone.  Family education as recommended by therapy   Current Update:  none  Current Barriers to Discharge:  Decreased caregiver support and Lack of/limited family support  Possible Resolutions to Barriers: Patient going home with sister, begin family education and continue through discharge.  Patient on target to meet rehab goals: yes, UTI being treated, Max assist with ADL's, min assist goals. Severe midline orientation, severe lean, educate patient and family on safety and fall precautions. SLP will need a repeat modified before discharge. Patient is slow to progress.  *See Care Plan and progress notes for long and short-term goals.   Revisions to Treatment Plan:  none    Medical Summary Current Status: right thalamic hemorrhage, left hemiparesis, dysphagia, urine retention Weekly Focus/Goal: bladder mgt, diet advancement, support/orthotics for hemiparetic limbs  Barriers to Discharge: Medical stability   Possible Resolutions to Barriers: regular labs, daily med mgt   Continued Need for Acute Rehabilitation Level of Care: The patient requires daily medical management  by a physician with specialized training in physical medicine and rehabilitation for the following reasons: Direction of a multidisciplinary physical rehabilitation program to maximize functional independence : Yes Medical management of patient stability for increased activity during participation in an  intensive rehabilitation regime.: Yes Analysis of laboratory values and/or radiology reports with any subsequent need for medication adjustment and/or medical intervention. : Yes   I attest that I was present, lead the team conference, and concur with the assessment and plan of the team.   Tennis Must 08/03/2019, 2:25 PM

## 2019-08-03 NOTE — Progress Notes (Signed)
Physical Therapy Weekly Progress Note  Patient Details  Name: [name] Cruz MRN: [id number] Date of Birth: 1955-01-21  Beginning of progress report period: July 27, 2019 End of progress report period: August 03, 2019  Today's Date: 08/03/2019   Patient has met 2 of 4 short term goals.  Pt is making slow progress towards LTGs. Pt has initiated gait training with +2 assist and requires mod/max assist for transfers. Pt continues to demonstrate significantly impaired midline orientation, sitting and standing balance, significantly impaired motor planning, and impaired awareness & cognition. Pt would benefit from continued skilled PT treatment to address the deficits noted above/below and to increase independence prior to d/c.  Patient continues to demonstrate the following deficits muscle weakness and muscle joint tightness, decreased cardiorespiratoy endurance, motor apraxia, decreased coordination and decreased motor planning, decreased visual perceptual skills, decreased attention to left, decreased initiation, decreased attention, decreased awareness, decreased problem solving, decreased safety awareness, decreased memory and delayed processing, and decreased sitting balance, decreased standing balance, decreased postural control, decreased balance strategies and L hemiparesis and therefore will continue to benefit from skilled PT intervention to increase functional independence with mobility.  Patient is slowly progressing toward long term goals..  Continue plan of care.  PT Short Term Goals Week 1:  PT Short Term Goal 1 (Week 1): Pt will complete bed mobilty with hospital bed features & mod assist. PT Short Term Goal 1 - Progress (Week 1): Not met PT Short Term Goal 2 (Week 1): Pt will complete bed<>w/c with LRAD & max assist +1. PT Short Term Goal 2 - Progress (Week 1): Met PT Short Term Goal 3 (Week 1): Pt will initiate gait training. PT Short Term Goal 3 - Progress (Week 1): Met PT Short  Term Goal 4 (Week 1): Pt will demonstrate sitting balance x 5 minutes with supervision. PT Short Term Goal 4 - Progress (Week 1): Not met Week 2:  PT Short Term Goal 1 (Week 2): Pt will consistently complete bed<>w/c transfers with LRAD & mod assist +1. PT Short Term Goal 2 (Week 2): Pt will ambulate 20 ft with LRAD & mod assist +1. PT Short Term Goal 3 (Week 2): Pt will consistently complete bed mobility with mod assist +1. PT Short Term Goal 4 (Week 2): Pt will propel w/c 40 ft with supervision.   Therapy Documentation Precautions:  Precautions Precautions: Fall Precaution Comments: L hemi, L inattention, pusher Restrictions Weight Bearing Restrictions: No   Therapy/Group: Individual Therapy  [name] Cruz 08/03/2019, 7:56 AM

## 2019-08-03 NOTE — Progress Notes (Signed)
Occupational Therapy Session Note  Patient Details  Name: [name] Cruz MRN: [id number] Date of Birth: 1955-08-23      Today's Date: 08/03/2019  Session 1 OT Individual Time: 9381-0175 OT Individual Time Calculation (min): 42 min   Session 2 OT Individual Time: 1025-8527 OT Individual Time Calculation (min): 44 min    Short Term Goals: Week 2:  OT Short Term Goal 1 (Week 2): Pt will demonstrate improved awareness of L UE by placing L arm into shirt sleeve with mod verbal cues OT Short Term Goal 2 (Week 2): Pt will complete sit<>stand with max A of 1 in preparation for BADL tasks OT Short Term Goal 3 (Week 2): Pt will maintain sitting balance seated EOB with no more than mod A for 3 minutes in preparation for BADL task.  Skilled Therapeutic Interventions/Progress Updates:    Session 1 Pt greeted semi-reclined in bed and agreeable to OT treatment session. Bed level LB bathing/dressing completed with pt able to wash peri-area with set-up A and verbal cues for task termination. OT assist to thread pant legs, then worked on hip bridging to pull up pants. Pt with increased extensor tone on L side this am requiring max A to break tone on LLE to achieve knee flexion for bridging. Pt then came to sitting EOB with max A.  Max A to don shoes, then pt completed squat-pivot to stronger R side with mod A of 1. UB dressing with focus on L hemi technqiues with max verbal and tactile cues to place L UE in shirt sleeve. Self-feeding completed sitting in wc with good use of feeding utensil and mod cues to locate food items on L side of plate. Pt needed verbal cues to slow down in between bites and sips of nectar thick liquid. OT placed SAEBO shoulder to help decrease shoulder subluxation. SAEBO left on for 60 minutes. OT returned to remove SAEBO with skin intact and no adverse reactions.  Sa[place]o Stim One 330 pulse width 35 Hz pulse rate On 8 sec/ off 8 sec Ramp up/ down 2 sec Symmetrical Biphasic wave form   Max intensity 132m at 500 Ohm load  Pt left seated in wc at end of session with alarm belt on, call bell in reach, and needs met.   Session 2 Pt greeted seated in wc and agreeable to OT treatment session. OT assisted with donning shoes, then brought pt down to therapy gym. Pt completed stand-pivot from wc> therapy mat on weaker L side with max A and max facilitation for weight shift and pivot, +2 present for safety. Pt perseverating on kicking out LLE and had difficulty following commands in more distracting gym environment. Pt needed max verbal and tactile cues as well as demonstration to pick L hand up using R hand. Chair set-up in front of pt and OT placed balance ball. Max verbal and tactile cues to place hands onto ball. PT needed OT and tech assist to maintain hand position on ball, then facilitated pushing and pulling as well as weight shifting.Incorporated counting during sets with pt needing cues to slow down and count with ball movement. Pt completed squat-pivot back to wc with mod A of 1. OT applied kinesiotape to L hand to help bring attention to L side and reduce hand swelling. Pt returned to room and left seated in wc with alarm belt on, call bell in reach, and needs met.   Therapy Documentation Precautions:  Precautions Precautions: Fall Precaution Comments: L hemi, L inattention, pusher  Restrictions Weight Bearing Restrictions: No Pain:  denies pain    Therapy/Group: Individual Therapy  Valma Cava 08/03/2019, 3:05 PM

## 2019-08-03 NOTE — Progress Notes (Signed)
Patient ID: [name] Cruz, male   DOB: 02-Sep-1955, 64 y.o.   MRN: [id number]  SW informed pt sister Steward Drone [phone]) with updates from team conference,and d/c date 08/24/2019. She indicated that their sister Diane lives in the home with him, and she would be the one providing care to him. SW discussed family education in efforts for pt sister Diane. Will discuss further as there are more updates in regards to d/c.   Cecile Sheerer, MSW, LCSWA Office: [phone] Cell: [phone] Fax: [phone]

## 2019-08-03 NOTE — Progress Notes (Signed)
Physical Therapy Session Note  Patient Details  Name: [name] Cruz MRN: [id number] Date of Birth: 1956-01-07  Today's Date: 08/03/2019 PT Individual Time: 0930-1025 PT Individual Time Calculation (min): 55 min   Short Term Goals: Week 2:  PT Short Term Goal 1 (Week 2): Pt will consistently complete bed<>w/c transfers with LRAD & mod assist +1. PT Short Term Goal 2 (Week 2): Pt will ambulate 20 ft with LRAD & mod assist +1. PT Short Term Goal 3 (Week 2): Pt will consistently complete bed mobility with mod assist +1. PT Short Term Goal 4 (Week 2): Pt will propel w/c 40 ft with supervision.  Skilled Therapeutic Interventions/Progress Updates:  Pt received in w/c & agreeable to tx. Transported pt to/from gym via w/c dependent assist for time management. Stand pivot w/c<>mat table with max assist with pt maintaining forward trunk flexion, requiring max cuing for stepping to turn, and assistance to pivot. While sitting EOM pt engaged in reaching anterior/R to retreive cups from floor with therapist facilitating movement. Performed sit<>stands from EOM with mirror for visual feedback with focus on anterior weight shifting but pt frequently losing balance posteriorly with poor awareness & inability to correct. Pt stands with max assist and reaches overhead with therapist facilitating increased weight shifting to R. Progressed to standing with RW with PT placing LUE on hand orthosis. Gait x 40 ft with RW & max assist + w/c follow for safety with therapist providing multimodal cuing for increased weight shifting R (pt somewhat able to perform), advance LLE (also utilized shoe cover for increased ease of advancement), managing RW, and assisting with turning. Pt then sat EOM & engaged in reaching R with focus on increased weight shifting R with pt experiencing 1 LOB to L requiring max/total assist to return to midline sitting. Back in w/c, pt propelled 100 ft with R hemi technique and min assist for steering  and obstacle avoidance on L. At end of session pt left in w/c with chair alarm donned & call bell in reach.  Therapy Documentation Precautions:  Precautions Precautions: Fall Precaution Comments: L hemi, L inattention, pusher Restrictions Weight Bearing Restrictions: No  Pain: No behaviors demonstrating, or c/o pain.    Therapy/Group: Individual Therapy  VICTORY STROLLO 08/03/2019, 10:58 AM

## 2019-08-04 ENCOUNTER — Inpatient Hospital Stay (HOSPITAL_COMMUNITY): Payer: Medicare Other

## 2019-08-04 ENCOUNTER — Inpatient Hospital Stay (HOSPITAL_COMMUNITY): Payer: Medicare Other | Admitting: Physical Therapy

## 2019-08-04 ENCOUNTER — Inpatient Hospital Stay (HOSPITAL_COMMUNITY): Payer: Medicare Other | Admitting: Speech Pathology

## 2019-08-04 NOTE — Plan of Care (Signed)
  Problem: RH Problem Solving Goal: LTG Patient will demonstrate problem solving for (SLP) Description: LTG:  Patient will demonstrate problem solving for basic/complex daily situations with cues  (SLP) Flowsheets (Taken 08/04/2019 0645) LTG Patient will demonstrate problem solving for: Minimal Assistance - Patient > 75% Note: Downgraded due to lack of progress    Problem: RH Memory Goal: LTG Patient will use memory compensatory aids to (SLP) Description: LTG:  Patient will use memory compensatory aids to recall biographical/new, daily complex information with cues (SLP) Flowsheets (Taken 08/04/2019 0645) LTG: Patient will use memory compensatory aids to (SLP): Minimal Assistance - Patient > 75% Note: Downgraded due to lack of progress    Problem: RH Awareness Goal: LTG: Patient will demonstrate awareness during functional activites type of (SLP) Description: LTG: Patient will demonstrate awareness during functional activites type of (SLP) Flowsheets (Taken 08/04/2019 0645) LTG: Patient will demonstrate awareness during cognitive/linguistic activities with assistance of (SLP): Moderate Assistance - Patient 50 - 74% Note: Downgraded due to lack of progress    Problem: RH Expression Communication Goal: LTG Patient will verbally express basic/complex needs(SLP) Description: LTG:  Patient will verbally express basic/complex needs, wants or ideas with cues  (SLP) Flowsheets (Taken 08/04/2019 0645) LTG: Patient will verbally express basic/complex needs, wants or ideas (SLP): Minimal Assistance - Patient > 75% Note: Goal initiated   Goal: LTG Patient will increase word finding of common (SLP) Description: LTG:  Patient will increase word finding of common objects/daily info/abstract thoughts with cues using compensatory strategies (SLP). Flowsheets (Taken 08/04/2019 0645) LTG: Patient will increase word finding of common (SLP): Minimal Assistance - Patient > 75% Patient will use compensatory  strategies to increase word finding of: Daily info Note: Goal initiated

## 2019-08-04 NOTE — Progress Notes (Signed)
Physical Therapy Session Note  Patient Details  Name: [name] Cruz MRN: [id number] Date of Birth: 28-Jan-1955  Today's Date: 08/04/2019 PT Individual Time: 3710-6269 PT Individual Time Calculation (min): 57 min   Short Term Goals: Week 2:  PT Short Term Goal 1 (Week 2): Pt will consistently complete bed<>w/c transfers with LRAD & mod assist +1. PT Short Term Goal 2 (Week 2): Pt will ambulate 20 ft with LRAD & mod assist +1. PT Short Term Goal 3 (Week 2): Pt will consistently complete bed mobility with mod assist +1. PT Short Term Goal 4 (Week 2): Pt will propel w/c 40 ft with supervision.  Skilled Therapeutic Interventions/Progress Updates:  Pt received in w/c requesting water & PT provided him with thickened drink & provided supervision while pt consumed a few sips. Pt propels w/c room<>gym with R hemi technique with min assist for linear path, up to max assist for completing turns with pt demonstrating L inattention & bumping wall on L. Stand pivot w/c>mat table with max assist with max cuing to advance feet & max assist to pivot to EOM.  Pt engaged in reaching R/anteriorly while sitting on EOM with PT facilitating movement with focus on increasing weight shift to R hip and improved midline orientation. Pt with improved ability to orient to midline today, although still requires max cuing, and with improved use of mirror to aide in midline orientation. Sit<>stand from EOM with max assist with focus on anterior weight shifting with equal weight distribution to BLE with therapist providing Max assist for weight shift R. Progressed to standing & reaching overhead to R for further balance & midline orientation retraining. Gait with RW x 15 ft + 5 ft with L hand orthosis, max assist & 2nd person for safety. PT provides MAX assist for weight shift R to allow increased ease of stepping LLE, as well as assisting with increased step width LLE to prevent narrow BOS but pt continuing to demonstrate  significant lean to L with inability to correct. At end of session pt left in w/c with chair alarm donned, call bell in reach & sister present to supervise. Educated pt's sister on pt's L inattention as well as need for nursing supervision to consume thickened liquid until she's checked off on safety plan by SLP with sister voicing understanding.  Therapy Documentation Precautions:  Precautions Precautions: Fall Precaution Comments: L hemi, L inattention, pusher Restrictions Weight Bearing Restrictions: No  Pain: No c/o pain reported   Therapy/Group: Individual Therapy  VALMORE ARABIE 08/04/2019, 4:06 PM

## 2019-08-04 NOTE — Progress Notes (Signed)
Speech Language Pathology Weekly Progress and Session Note  Patient Details  Name: [name] Cruz MRN: [id number] Date of Birth: 03-Oct-1955  Beginning of progress report period: July 26, 2019 End of progress report period: August 04, 2019  Today's Date: 08/04/2019 SLP Individual Time: 1305-1330 SLP Individual Time Calculation (min): 25 min  Short Term Goals: Week 1: SLP Short Term Goal 1 (Week 1): Patient will utilize speech intelligibility strategies at the sentence level to achieve ~90% intelligibility with supervision verbal cues. SLP Short Term Goal 1 - Progress (Week 1): Met SLP Short Term Goal 2 (Week 1): Patient will consume current diet with minimal overt s/s of aspiration with overall supervision verbal cues for use of swallowing compensatory strategies. SLP Short Term Goal 2 - Progress (Week 1): Met SLP Short Term Goal 3 (Week 1): Patient will consume trials of thin liquids with minimal overt s/s of aspiration over 2 sessions to assess readiness for repeat MBS. SLP Short Term Goal 3 - Progress (Week 1): Not met SLP Short Term Goal 4 (Week 1): Patient will demonstrate functional problem solving for basic and familiar tasks with Min A verbal cues. SLP Short Term Goal 4 - Progress (Week 1): Not met SLP Short Term Goal 5 (Week 1): Patient will attend to left field of enviornment during functional tasks with Min A verbal cues. SLP Short Term Goal 5 - Progress (Week 1): Met SLP Short Term Goal 6 (Week 1): Patient will recall new, daily information with Min A verbal cues. SLP Short Term Goal 6 - Progress (Week 1): Not met    New Short Term Goals: Week 2: SLP Short Term Goal 1 (Week 2): Patient will consume trials of thin liquids with only supervision verbal cues needed to minimize oral holding or "swishing" of liquids prior to the swallow without overt s/s of aspiration over 2 sessions prior to repeat MBS. SLP Short Term Goal 2 (Week 2): Patient will utilize external aids for recall of  time and place with Mod A verbal and visual cues. SLP Short Term Goal 3 (Week 2): Patient will follow 1-step commands during functional tasks with Mod A verbal and visual cues. SLP Short Term Goal 4 (Week 2): Patient will demonstrate basic problem solving for functional and familiar tasks with Mod A verbal and visual cues. SLP Short Term Goal 5 (Week 2): Patient will participate in structured language tasks and improve word-finding/naming to 75% accuracy and Mod A multimodal cues. SLP Short Term Goal 6 (Week 2): Patient will verbalize wants/needs at the sentence level with Min A multimodal cues for word-finidng.  Weekly Progress Updates: Patient has made slow and inconsistent gains and has met 3 of 6 STGs this reporting period. Currently, patient is consuming Dys. 3 textures with nectar-thick liquids with minimal overt s/s of aspiration and overall Mod I-Supervision for use of swallowing compensatory strategies. Patient is consuming trials of thin liquids and demonstrates prolonged AP transit with oral holding and  "swishing" of liquids which maximizes his aspiration risk. Ongoing trials needed prior to repeat MBS. Patient continues to require overall Max A multimodal cues to complete functional and familiar tasks safely in regards to problem solving, recall and awareness. Patient's cognitive functioning appears exacerbated by decreased receptive and expressive language characterized by decreased word-finding and naming and rehearsed and repetitive verbal responses like,  "I understand." Patient and family education is ongoing. Patient would benefit from continued skilled SLP intervention to maximize his cognitive-linguistic and swallowing function prior to discharge.  Intensity: Minumum of 1-2 x/day, 30 to 90 minutes Frequency: 3 to 5 out of 7 days Duration/Length of Stay: 08/24/19 Treatment/Interventions: Cognitive remediation/compensation;Dysphagia/aspiration precaution training;Internal/external  aids;Speech/Language facilitation;Cueing hierarchy;Environmental controls;Therapeutic Activities;Functional tasks;Patient/family education   Daily Session  Skilled Therapeutic Interventions: Skilled treatment session focused on cognitive-linguistic goals. SLP facilitated session by providing Min A verbal and visual cues for problem solving during a 4-step picture sequencing task and Min A verbal cues with extra time for word-finding. Patient's overall verbal expression and word-finding appeared improved today, suspect due to task appearing more informal for verbal expression vs structured. Patient also demonstrated increased awareness of verbal deficits and often reported, "I will think of it in a minute." Patient left upright in the wheelchair with alarm on and all needs within reach. Continue with current plan of care.     Pain No/Denies Pain   Therapy/Group: Individual Therapy  [name] Cruz 08/04/2019, 6:47 AM

## 2019-08-04 NOTE — Progress Notes (Signed)
Occupational Therapy Session Note  Patient Details  Name: [name] Cruz MRN: [id number] Date of Birth: 24-Mar-1955  Today's Date: 08/04/2019 OT Individual Time: 0820-0905 OT Individual Time Calculation (min): 45 min   Session 2: OT Individual Time: 7618-4859 OT Individual Time Calculation (min): 38 min    Short Term Goals: Week 2:  OT Short Term Goal 1 (Week 2): Pt will demonstrate improved awareness of L UE by placing L arm into shirt sleeve with mod verbal cues OT Short Term Goal 2 (Week 2): Pt will complete sit<>stand with max A of 1 in preparation for BADL tasks OT Short Term Goal 3 (Week 2): Pt will maintain sitting balance seated EOB with no more than mod A for 3 minutes in preparation for BADL task.  Skilled Therapeutic Interventions/Progress Updates:    Pt received supine, agreeable to OT session focused on ADLs. Pt oriented to place only and unable to carryover orientation information once provided. Pt's brief checked and was clean. Pt completed semi-bridge while supine to don shorts. Pt required max cueing to place R hand on shorts and attempt to pull up while bridging. Pt transitioned to EOB with mod A. With LUE placed in weightbearing position pt was able to maintain sitting balance with min A. Min A to wash UB and min cueing for termination. Pt requiring tactile cues often to accompany verbal cues. Pt donned shirt with mod A overall and max cueing for hemi technique. Pt completed stand pivot transfer to the w/c with max A, max cueing for technique. Pt demonstrating significant apraxia throughout session. Pt instructed to find toothbrush and pt repeatedly grabbed comb. Pt able to state what each object was used for but not name of the object. Pt required mod cueing for initiation of grooming tasks. Pt shaved with electric razor with min cueing for initiation. Pt put in his dentures and was left sitting up in the w/c, NT arriving to assist with breakfast. Chair alarm belt fastened.    Session 2: Pt received sitting in w/c agreeable to OT session. No c/o pain. Pt was taken to the therapy gym via w/c for time management. Stand pivot transfer with RW, splint for LUE. Total A for LUE placement on RW. Pt required max +2 for stand pivot transfer with heavy multimodal cueing for sequencing transfer. Pt required frequent cueing and CGA-mod A for sitting balance while EOM, for midline orientation and upright posture. Pt with slight pushing left and posterior bias. Pt completed functional reaching toward the R to correct L lean. Pt frequently lifting RLE off the ground and requiring mod facilitation to keep planted on the ground. Pt with obvious apraxia throughout session, benefiting from tactile cues especially. Pt completed forward leaning on a therapy ball to improve midline orientation and core stabilization. Pt returned to his w/c, stand pivot toward the L side, max +2 assist. Pt returned to his room and was offered nectar thickened juice. Supervision provided during consumption, no overt s/s of aspiration, requiring min cueing for slow rate. Pt left sitting up with all needs met, chair alarm set.    Therapy Documentation Precautions:  Precautions Precautions: Fall Precaution Comments: L hemi, L inattention, pusher Restrictions Weight Bearing Restrictions: No   Therapy/Group: Individual Therapy  Curtis Sites 08/04/2019, 6:55 AM

## 2019-08-04 NOTE — Progress Notes (Signed)
[facility] PHYSICAL MEDICINE & REHABILITATION PROGRESS NOTE   Subjective/Complaints:   No new complaints. Arm still a little swollen but no worse  ROS: Patient denies fever, rash, sore throat, blurred vision, nausea, vomiting, diarrhea, cough, shortness of breath or chest pain, joint or back pain, headache, or mood change.     Objective:   No results found. Recent Labs    08/02/19 1259  WBC 9.9  HGB 12.5*  HCT 38.2*  PLT 314   Recent Labs    08/02/19 1259  NA 138  K 3.8  CL 100  CO2 27  GLUCOSE 103*  BUN 11  CREATININE 0.59*  CALCIUM 8.9    Intake/Output Summary (Last 24 hours) at 08/04/2019 0802 Last data filed at 08/03/2019 2252 Gross per 24 hour  Intake 710 ml  Output 525 ml  Net 185 ml     Physical Exam: Vital Signs Blood pressure 121/76, pulse 76, temperature 98.2 F (36.8 C), resp. rate 16, height 5\' 3"  (1.6 m), weight 78.3 kg, SpO2 98 %.  Constitutional: No distress . Vital signs reviewed. HEENT: EOMI, oral membranes moist Neck: supple Cardiovascular: RRR without murmur. No JVD    Respiratory/Chest: CTA Bilaterally without wheezes or rales. Normal effort    GI/Abdomen: BS +, non-tender, non-distended Ext: no clubbing, cyanosis, 1+ LUE edema Psych: pleasant and cooperative Skin: skin intct, no erythema Neuro: Pt is cognitively appropriate with reasonable insight, memory, and awareness.   Sensory exam is normal. Reflexes are 2+ in all 4's. Fine motor coordination is intact. No tremors. Motor function is grossly 5/5 RUE/RLE. LUE 0 to tr/5 LLE 1+/5. Sensation tr-1/2 left side. Left central 7 Musculoskeletal: Full ROM, No pain with AROM or PROM in the neck, trunk, or extremities. Posture appropriate in sitting        Assessment/Plan: 1. Functional deficits secondary to right thalamocapsular hemorrhage which require 3+ hours per day of interdisciplinary therapy in a comprehensive inpatient rehab setting.  Physiatrist is providing close team  supervision and 24 hour management of active medical problems listed below.  Physiatrist and rehab team continue to assess barriers to discharge/monitor patient progress toward functional and medical goals  Care Tool:  Bathing    Body parts bathed by patient: Left arm, Chest, Abdomen, Right upper leg, Face   Body parts bathed by helper: Right arm, Front perineal area, Buttocks, Left upper leg, Right lower leg     Bathing assist Assist Level: 2 Helpers     Upper Body Dressing/Undressing Upper body dressing Upper body dressing/undressing activity did not occur (including orthotics): Refused (asked. pt did not want to change shirt. ) What is the patient wearing?: Pull over shirt    Upper body assist Assist Level: Moderate Assistance - Patient 50 - 74%    Lower Body Dressing/Undressing Lower body dressing      What is the patient wearing?: Incontinence brief     Lower body assist Assist for lower body dressing: Total Assistance - Patient < 25%     Toileting Toileting Toileting Activity did not occur and hygiene only): N/A (no void or bm)  Toileting assist Assist for toileting: Dependent - Patient 0%     Transfers Chair/bed transfer  Transfers assist  Chair/bed transfer activity did not occur: Safety/medical concerns  Chair/bed transfer assist level: Maximal Assistance - Patient 25 - 49%     Locomotion Ambulation   Ambulation assist   Ambulation activity did not occur: Safety/medical concerns  Assist level: 2 helpers (max assist +  2nd person for w/c follow) Assistive device: Walker-rolling (L hand orthosis) Max distance: 40 ft   Walk 10 feet activity   Assist  Walk 10 feet activity did not occur: Safety/medical concerns  Assist level: 2 helpers (max assist + 2nd person for w/c follow) Assistive device: Walker-rolling (L hand orthosis)   Walk 50 feet activity   Assist Walk 50 feet with 2 turns activity did not occur: Safety/medical  concerns  Assist level: 2 helpers Assistive device: Hand held assist    Walk 150 feet activity   Assist Walk 150 feet activity did not occur: Safety/medical concerns         Walk 10 feet on uneven surface  activity   Assist Walk 10 feet on uneven surfaces activity did not occur: Safety/medical concerns         Wheelchair     Assist Will patient use wheelchair at discharge?: Yes Type of Wheelchair: Manual Wheelchair activity did not occur: Safety/medical concerns  Wheelchair assist level: Minimal Assistance - Patient > 75% Max wheelchair distance: 100 ft    Wheelchair 50 feet with 2 turns activity    Assist    Wheelchair 50 feet with 2 turns activity did not occur: Safety/medical concerns   Assist Level: Minimal Assistance - Patient > 75%   Wheelchair 150 feet activity     Assist  Wheelchair 150 feet activity did not occur: Safety/medical concerns   Assist Level: Maximal Assistance - Patient 25 - 49%   Blood pressure 121/76, pulse 76, temperature 98.2 F (36.8 C), resp. rate 16, height 5\' 3"  (1.6 m), weight 78.3 kg, SpO2 98 %.  Medical Problem List and Plan: 1.Left side weakness/dysarthriasecondary to rightthalamocapsularhemorrhage most likely related to hypertensive crisis as well as old left temporal parietal infarction with small vessel disease -patient may shower -ELOS/Goals: 8/10, min assist goals  -WHO LUE, PRAFO  --Continue CIR therapies including PT, OT, and SLP  2. Antithrombotics: -DVT/anticoagulation:SCD -antiplatelet therapy: N/A 3. Pain Management:Tylenol as needed  7/19- pain well controlled- con't tylneol prn 4. Mood:Zoloft 25 mg daily -antipsychotic agents: Geodon 40 mg nightly -mood well controlled 5. Neuropsych: This patientis notcapable of making decisions on hisown behalf. 6. Skin/Wound Care:local care to hand, elevate LUE while in bed/chair 7.  Fluids/Electrolytes/Nutrition:. -encourage fluids/po, intake picking up  -protein supp    8. Dysphagia. Dysphagia #3 nectar thick liquids. Advance perspeech therapy   9. History of seizure disorder. Vimpat 200 mg twice daily, Keppra 1500 mg twice daily, Depakote 500 mg twice daily.  10. Hypertension. Coreg 3.125 mg twice daily.  Controlled at present  7/20- BP controlled- con't regimen 11. PAF. Cardiac rate controlled, regular currently -No anticoagulationdue to history of left MCA infarction with hemorrhagic conversion 12. Hyperlipidemia. Resumed Lipitor   13. Obesity. BMI 30. Dietary follow-up 14. CAD with CABG 2019. No chest pain or shortness of breath. 15. Mild thrombocytopenia: 161, follow up cbc Monday 7/19 16 Neurogenic bladder: pvr's better  - I/O cath prn, monitor PVR's  -OOB to void  7/17 -ua is equivocal, ucx 80k staph epi--S to macrodantin--->5 days  7/21 continue flomax. PVR's 200 or less now   LOS: 9 days A FACE TO FACE EVALUATION WAS PERFORMED  8/21 08/04/2019, 8:02 AM

## 2019-08-05 ENCOUNTER — Inpatient Hospital Stay (HOSPITAL_COMMUNITY): Payer: Medicare Other | Admitting: Speech Pathology

## 2019-08-05 ENCOUNTER — Inpatient Hospital Stay (HOSPITAL_COMMUNITY): Payer: Medicare Other | Admitting: Occupational Therapy

## 2019-08-05 ENCOUNTER — Inpatient Hospital Stay (HOSPITAL_COMMUNITY): Payer: Medicare Other | Admitting: Physical Therapy

## 2019-08-05 NOTE — Progress Notes (Signed)
Pt scanned for 750 ml, tech attempted cath but received some blood return. This nurse completed in and out cath with coude and lidocaine jelly. Removed 900 ml of clear yellow urine. Pt complains of no pain. No other problems to report.  Errika Narvaiz, lpn

## 2019-08-05 NOTE — Progress Notes (Signed)
Speech Language Pathology Daily Session Note  Patient Details  Name: [name] Cruz MRN: [id number] Date of Birth: [birth year]  Today's Date: 08/05/2019 SLP Individual Time: 1330-1410 SLP Individual Time Calculation (min): 40 min  Short Term Goals: Week 2: SLP Short Term Goal 1 (Week 2): Patient will consume trials of thin liquids with only supervision verbal cues needed to minimize oral holding or "swishing" of liquids prior to the swallow without overt s/s of aspiration over 2 sessions prior to repeat MBS. SLP Short Term Goal 2 (Week 2): Patient will utilize external aids for recall of time and place with Mod A verbal and visual cues. SLP Short Term Goal 3 (Week 2): Patient will follow 1-step commands during functional tasks with Mod A verbal and visual cues. SLP Short Term Goal 4 (Week 2): Patient will demonstrate basic problem solving for functional and familiar tasks with Mod A verbal and visual cues. SLP Short Term Goal 5 (Week 2): Patient will participate in structured language tasks and improve word-finding/naming to 75% accuracy and Mod A multimodal cues. SLP Short Term Goal 6 (Week 2): Patient will verbalize wants/needs at the sentence level with Min A multimodal cues for word-finidng.  Skilled Therapeutic Interventions: Skilled treatment session focused on dysphagia and communication goals. SLP facilitated session by providing trials of thin liquids via tsp after oral care. Patient demonstrated what appeared to be a delayed swallow initiation due to "swishing" of the water. Patient did not demonstrate any overt s/s of aspiration with trials but did have intermittent changes in his voice. Swallow initiation appeared to increase in speed with Min verbal and visual cues. Recommend ongoing trials prior to MBS, therefore, patient placed on the water protocol. SLP also facilitated session by providing extra time and Min A verbal cues for naming of objects but patient demonstrated 100% accuracy  with category exclusion. Patient left upright in the wheelchair with alarm on and all needs within reach. Continue with current plan of care.      Pain No/Denies Pain   Therapy/Group: Individual Therapy  Sephira Zellman 08/05/2019, 3:34 PM

## 2019-08-05 NOTE — Progress Notes (Signed)
Physical Therapy Session Note  Patient Details  Name: [name] Cruz MRN: [id number] Date of Birth: July 11, 1955  Today's Date: 08/05/2019 PT Individual Time: 0800-0900 PT Individual Time Calculation (min): 60 min   Short Term Goals: Week 2:  PT Short Term Goal 1 (Week 2): Pt will consistently complete bed<>w/c transfers with LRAD & mod assist +1. PT Short Term Goal 2 (Week 2): Pt will ambulate 20 ft with LRAD & mod assist +1. PT Short Term Goal 3 (Week 2): Pt will consistently complete bed mobility with mod assist +1. PT Short Term Goal 4 (Week 2): Pt will propel w/c 40 ft with supervision.  Skilled Therapeutic Interventions/Progress Updates:  Pt received supine in bed in room, resting comfortably. Agreeable to therapy. Denies any pain at rest prior to start of therapy session. L resting hand splint and L PRAFO removed dependently by therapist at bed level. Pt is min A for rolling L in bed with verbal cues required to bend RLE. Supine to sit at EOB with max A for LE management and trunk control. Pt exhibits L lateral trunk lean while seated at EOB with min A required to maintain static sitting balance. Shoes donned dependently with pt seated at EOB. Squat pivot transfer from EOB to W/C with max A. Verbal and tactile cues required for correct hand placement while performing SPT. Dependent W/C transfer from room to therapy gym for time conservation. Squat pivot transfer from W/C to mat table with max A. Remainder of session focused on balance, neuromusuclar re-ed, and trunk strength. Seated on mat table, performed activity consisting of reaching for and then placing clips at edge of base of support and across midline using RUE. Pt required min-mod A during this exercise to maintain dynamic sitting balance, as well as seated rest breaks leaning back against an exercise ball d/t decreased endurance. Mirror placed in front of pt to promote upright posture in sitting. Frequent verbal and tactile cues  required throughout remainder of session to promote upright posture. Performed seated R lateral trunk leans on mat table 1 x 8 with verbal cues required to promote weightbearing through RUE and min A required to return to midline. Performed seated mini-crunches on mat table with pt leaning backwards against an exercise ball 1 x 8 with min A initially and mod A as pt fatigued. Verbal cues required for correct performance of exercise. As pt fatigued with this activity, he demonstrated less trunk activation on the L. Squat pivot transfer from mat table to W/C with max A. Dependent W/C transfer from therapy gym to room for time conservation. Pt left seated in W/C in room with all needs in reach. Quick release belt, chair alarm, and L lap tray in place. All questions answered.  Therapy Documentation Precautions:  Precautions Precautions: Fall Precaution Comments: L hemi, L inattention, pusher Restrictions Weight Bearing Restrictions: No  Therapy/Group: Individual Therapy  Murrell Redden, SPT 08/05/2019, 12:35 PM

## 2019-08-05 NOTE — Progress Notes (Signed)
[facility] PHYSICAL MEDICINE & REHABILITATION PROGRESS NOTE   Subjective/Complaints:  No new complaints. Had to be cathed again this morning for 900cc, first time in a few days.   ROS: Patient denies fever, rash, sore throat, blurred vision, nausea, vomiting, diarrhea, cough, shortness of breath or chest pain, joint or back pain, headache, or mood change.     Objective:   No results found. Recent Labs    08/02/19 1259  WBC 9.9  HGB 12.5*  HCT 38.2*  PLT 314   Recent Labs    08/02/19 1259  NA 138  K 3.8  CL 100  CO2 27  GLUCOSE 103*  BUN 11  CREATININE 0.59*  CALCIUM 8.9    Intake/Output Summary (Last 24 hours) at 08/05/2019 0828 Last data filed at 08/05/2019 0452 Gross per 24 hour  Intake 840 ml  Output 1300 ml  Net -460 ml     Physical Exam: Vital Signs Blood pressure 125/72, pulse 70, temperature 97.8 F (36.6 C), temperature source Oral, resp. rate 16, height 5\' 3"  (1.6 m), weight 78.3 kg, SpO2 98 %.  Constitutional: No distress . Vital signs reviewed. HEENT: EOMI, oral membranes moist Neck: supple Cardiovascular: RRR without murmur. No JVD    Respiratory/Chest: CTA Bilaterally without wheezes or rales. Normal effort    GI/Abdomen: BS +, non-tender, non-distended Ext: no clubbing, cyanosis, trace LUE edema Psych: pleasant and cooperative Skin: skin intct, no erythema Neuro: Pt is cognitively appropriate with reasonable insight, memory, and awareness.   Sensory exam is normal. Reflexes are 2+ in all 4's. Fine motor coordination is intact. No tremors. Motor function is grossly 5/5 RUE/RLE. LUE 0 to tr/5 LLE 1+ to 2-/5. Sensation tr-1/2 left side. Left central 7 Musculoskeletal: Full ROM, No pain with AROM or PROM in the neck, trunk, or extremities.          Assessment/Plan: 1. Functional deficits secondary to right thalamocapsular hemorrhage which require 3+ hours per day of interdisciplinary therapy in a comprehensive inpatient rehab  setting.  Physiatrist is providing close team supervision and 24 hour management of active medical problems listed below.  Physiatrist and rehab team continue to assess barriers to discharge/monitor patient progress toward functional and medical goals  Care Tool:  Bathing    Body parts bathed by patient: Left arm, Chest, Abdomen, Face   Body parts bathed by helper: Right arm, Front perineal area, Buttocks, Left upper leg, Right lower leg     Bathing assist Assist Level: Minimal Assistance - Patient > 75%     Upper Body Dressing/Undressing Upper body dressing Upper body dressing/undressing activity did not occur (including orthotics): Refused (asked. pt did not want to change shirt. ) What is the patient wearing?: Pull over shirt    Upper body assist Assist Level: Moderate Assistance - Patient 50 - 74%    Lower Body Dressing/Undressing Lower body dressing      What is the patient wearing?: Pants     Lower body assist Assist for lower body dressing: Maximal Assistance - Patient 25 - 49% (supine)     Toileting Toileting Toileting Activity did not occur (Clothing management and hygiene only): N/A (no void or bm)  Toileting assist Assist for toileting: Dependent - Patient 0%     Transfers Chair/bed transfer  Transfers assist  Chair/bed transfer activity did not occur: Safety/medical concerns  Chair/bed transfer assist level: Maximal Assistance - Patient 25 - 49%     Locomotion Ambulation   Ambulation assist   Ambulation activity  did not occur: Safety/medical concerns  Assist level: 2 helpers Assistive device: Walker-rolling Max distance: 15 ft   Walk 10 feet activity   Assist  Walk 10 feet activity did not occur: Safety/medical concerns  Assist level: 2 helpers Assistive device: Walker-rolling   Walk 50 feet activity   Assist Walk 50 feet with 2 turns activity did not occur: Safety/medical concerns  Assist level: 2 helpers Assistive device: Hand  held assist    Walk 150 feet activity   Assist Walk 150 feet activity did not occur: Safety/medical concerns         Walk 10 feet on uneven surface  activity   Assist Walk 10 feet on uneven surfaces activity did not occur: Safety/medical concerns         Wheelchair     Assist Will patient use wheelchair at discharge?: Yes Type of Wheelchair: Manual Wheelchair activity did not occur: Safety/medical concerns  Wheelchair assist level: Moderate Assistance - Patient 50 - 74% Max wheelchair distance: 150 ft    Wheelchair 50 feet with 2 turns activity    Assist    Wheelchair 50 feet with 2 turns activity did not occur: Safety/medical concerns   Assist Level: Moderate Assistance - Patient 50 - 74%   Wheelchair 150 feet activity     Assist  Wheelchair 150 feet activity did not occur: Safety/medical concerns   Assist Level: Moderate Assistance - Patient 50 - 74%   Blood pressure 125/72, pulse 70, temperature 97.8 F (36.6 C), temperature source Oral, resp. rate 16, height 5\' 3"  (1.6 m), weight 78.3 kg, SpO2 98 %.  Medical Problem List and Plan: 1.Left side weakness/dysarthriasecondary to rightthalamocapsularhemorrhage most likely related to hypertensive crisis as well as old left temporal parietal infarction with small vessel disease -patient may shower -ELOS/Goals: 8/10, min assist goals  -WHO LUE, PRAFO  --Continue CIR therapies including PT, OT, and SLP  2. Antithrombotics: -DVT/anticoagulation:SCD -antiplatelet therapy: N/A 3. Pain Management:Tylenol as needed  7/22 pain well controlled- con't tylneol prn 4. Mood:Zoloft 25 mg daily -antipsychotic agents: Geodon 40 mg nightly -mood seems for the most part well controlled 5. Neuropsych: This patientis notcapable of making decisions on hisown behalf. 6. Skin/Wound Care:local care to hand, elevate LUE while in bed/chair 7.  Fluids/Electrolytes/Nutrition:. -encourage fluids/po, intake picking up  -protein supp  -recent labs wnl 8. Dysphagia. Dysphagia #3 nectar thick liquids. Advance perspeech therapy   9. History of seizure disorder. Vimpat 200 mg twice daily, Keppra 1500 mg twice daily, Depakote 500 mg twice daily.  10. Hypertension. Coreg 3.125 mg twice daily.  Controlled at present  7/22- BP controlled- con't regimen 11. PAF. Cardiac rate controlled, regular currently -No anticoagulationdue to history of left MCA infarction with hemorrhagic conversion 12. Hyperlipidemia. Resumed Lipitor   13. Obesity. BMI 30. Dietary follow-up 14. CAD with CABG 2019. No chest pain or shortness of breath. 15. Mild thrombocytopenia: 161, follow up cbc Monday 7/19 16 Neurogenic bladder: pvr's have been better until this morning 7/22  - I/O cath prn, monitor PVR's  -OOB to void  -pt completed 5 days of macrodantin for 80k staph epi  -7/22 continue flomax. If further retention occurs, consider repeat urine cx   LOS: 10 days A FACE TO FACE EVALUATION WAS PERFORMED  8/22 08/05/2019, 8:28 AM

## 2019-08-05 NOTE — Progress Notes (Signed)
Occupational Therapy Session Note  Patient Details  Name: [name] Cruz MRN: [id number] Date of Birth: 11/29/1955  Today's Date: 08/05/2019 OT Individual Time: 3074-6002 OT Individual Time Calculation (min): 58 min    Short Term Goals: Week 2:  OT Short Term Goal 1 (Week 2): Pt will demonstrate improved awareness of L UE by placing L arm into shirt sleeve with mod verbal cues OT Short Term Goal 2 (Week 2): Pt will complete sit<>stand with max A of 1 in preparation for BADL tasks OT Short Term Goal 3 (Week 2): Pt will maintain sitting balance seated EOB with no more than mod A for 3 minutes in preparation for BADL task.  Skilled Therapeutic Interventions/Progress Updates:    Pt greeted seated in wc and agreeable to OT treatment session focused on bathing/dressing tasks. Worked on visual scanning to the L and word finding to locate grooming and bathing items at the sink. Pt with difficulty naming items, mostly saying " I understand" but not coming up with correct term. OT incorporated L NMR with hand over hand A to integrate L UE into bathing tasks. Slight activation noted in pecs today during bathing. Educated on use of L UE as a stabilizer with BADLs, but needed max A to integrate in functional task. Pt completed sit<>stands at the sink with improved power up at mod A. Mod/max A for standing balance, while +2 assisted with washing buttocks and donning clean brief. Pt was better able to achieve hip and trunk extension in standing when cued today. Worked on anterior weight shift to the R to lean forward and thread pant legs. OT placed SAEBO e-stim on wrist extensors. SAEBO left on for 60 minutes. OT returned to remove SAEBO with skin intact and no adverse reactions.  [place] Stim One 330 pulse width 35 Hz pulse rate On 8 sec/ off 8 sec Ramp up/ down 2 sec Symmetrical Biphasic wave form  Max intensity 158m at 500 Ohm load  Pt left seated in wc at end of session with alarm belt on, call bell in  reach, and needs met.    Therapy Documentation Precautions:  Precautions Precautions: Fall Precaution Comments: L hemi, L inattention, pusher Restrictions Weight Bearing Restrictions: No Pain: Denies pain  Therapy/Group: Individual Therapy  [name]7/22/2021, 10:46 AM

## 2019-08-06 ENCOUNTER — Inpatient Hospital Stay (HOSPITAL_COMMUNITY): Payer: Medicare Other | Admitting: Occupational Therapy

## 2019-08-06 ENCOUNTER — Inpatient Hospital Stay (HOSPITAL_COMMUNITY): Payer: Medicare Other | Admitting: Physical Therapy

## 2019-08-06 ENCOUNTER — Inpatient Hospital Stay (HOSPITAL_COMMUNITY): Payer: Medicare Other | Admitting: Speech Pathology

## 2019-08-06 DIAGNOSIS — R0989 Other specified symptoms and signs involving the circulatory and respiratory systems: Secondary | ICD-10-CM

## 2019-08-06 DIAGNOSIS — I10 Essential (primary) hypertension: Secondary | ICD-10-CM

## 2019-08-06 DIAGNOSIS — I69354 Hemiplegia and hemiparesis following cerebral infarction affecting left non-dominant side: Secondary | ICD-10-CM

## 2019-08-06 DIAGNOSIS — I609 Nontraumatic subarachnoid hemorrhage, unspecified: Secondary | ICD-10-CM

## 2019-08-06 DIAGNOSIS — N319 Neuromuscular dysfunction of bladder, unspecified: Secondary | ICD-10-CM

## 2019-08-06 NOTE — Progress Notes (Signed)
Occupational Therapy Session Note  Patient Details  Name: [name] Cruz MRN: [id number] Date of Birth: Jul 30, 1955  Today's Date: 08/06/2019 OT Group Time: 1128-1200 OT Group Time Calculation (min): 32 min  Skilled Therapeutic Interventions/Progress Updates:    Pt engaged in therapeutic w/c level dance group focusing on patient choice, UE/LE strengthening, salience, activity tolerance, and social participation. Pt was guided through various dance-based exercises involving UEs/LEs and trunk. All music was selected by group members. Emphasis was placed on Lt sided coordination, praxis, and Lt attention. HOH for utilizing joint protection strategies when including affected Lt UE throughout dancing. Demonstrational and manual cuing for carryover of self ROM techniques as well. Pt smiling and appearing joyful in the social environment. At end of session he was escorted back to room by RT.    Therapy Documentation Precautions:  Precautions Precautions: Fall Precaution Comments: L hemi, L inattention, pusher Restrictions Weight Bearing Restrictions: No Pain: no s/s pain during tx Pain Assessment Pain Score: 0-No pain ADL: ADL Eating: Minimal assistance Grooming: Maximal assistance Upper Body Bathing: Maximal assistance Lower Body Bathing: Maximal assistance Upper Body Dressing: Moderate assistance Lower Body Dressing: Maximal assistance Toileting: Unable to assess Toilet Transfer: Maximal assistance (+2 using Stedy)      Therapy/Group: Group Therapy  Ermias Tomeo A Tanaisha Pittman 08/06/2019, 12:42 PM

## 2019-08-06 NOTE — Progress Notes (Signed)
Physical Therapy Session Note  Patient Details  Name: [name] Cruz MRN: [id number] Date of Birth: 02-07-1955  Today's Date: 08/06/2019 PT Individual Time: 1517-1600 PT Individual Time Calculation (min): 43 min   Short Term Goals: Week 2:  PT Short Term Goal 1 (Week 2): Pt will consistently complete bed<>w/c transfers with LRAD & mod assist +1. PT Short Term Goal 2 (Week 2): Pt will ambulate 20 ft with LRAD & mod assist +1. PT Short Term Goal 3 (Week 2): Pt will consistently complete bed mobility with mod assist +1. PT Short Term Goal 4 (Week 2): Pt will propel w/c 40 ft with supervision.  Skilled Therapeutic Interventions/Progress Updates:  Pt received in w/c reporting need to use restroom. Sit<>Stand in stedy with +2 assist with pt continuing to demonstrate strong push to L and inability to fully shift pelvis anteriorly & max cuing but poor demo for midline orientation. Pt requires dependent assist for clothing & brief management & to change into new brief. Pt with continent void on toilet with assistance to use urinal from NT. Pt returned to w/c & propels in hallway with RUE, with minimal to no use of RLE to assist with steering despite cuing and pt frequently running into objects on L with very poor to no awareness of this even with multimodal cuing and frequently bumping objects. Attempted to have pt steer to R but pt continues to demonstrate difficulty & decreased awareness to overall task.  Pt with impaired ability to follow 1 step commands and motor plan throughout session. While sitting in w/c pt utilized kinetron with MAX assist and hand over leg assist to initiate pattern but pt then able to stop & start on his own without assistance with task focusing on BLE strengthening & NMR. At end of session pt left in w/c with chair alarm donned, call bell in reach, sister in room.  Therapy Documentation Precautions:  Precautions Precautions: Fall Precaution Comments: L hemi, L inattention,  pusher Restrictions Weight Bearing Restrictions: No  Pain: No c/o pain reported   Therapy/Group: Individual Therapy  AMROM ORE 08/06/2019, 4:15 PM

## 2019-08-06 NOTE — Progress Notes (Signed)
[facility] PHYSICAL MEDICINE & REHABILITATION PROGRESS NOTE   Subjective/Complaints: Patient seen sitting up in his chair this morning.  He states he slept well overnight.  No reported issues overnight.  ROS: Denies CP, SOB, N/V/D  Objective:   No results found. No results for input(s): WBC, HGB, HCT, PLT in the last 72 hours. No results for input(s): NA, K, CL, CO2, GLUCOSE, BUN, CREATININE, CALCIUM in the last 72 hours.  Intake/Output Summary (Last 24 hours) at 08/06/2019 0905 Last data filed at 08/06/2019 2409 Gross per 24 hour  Intake 970 ml  Output 200 ml  Net 770 ml     Physical Exam: Vital Signs Blood pressure (!) 90/51, pulse 75, temperature 97.6 F (36.4 C), resp. rate 15, height 5\' 3"  (1.6 m), weight 78.3 kg, SpO2 99 %. Constitutional: No distress . Vital signs reviewed. HENT: Normocephalic.  Atraumatic. Eyes: EOMI. No discharge. Cardiovascular: No JVD.  Respiratory: Normal effort.  No stridor. Bilaterally clear to auscultation.  GI: Non-distended. Skin: Warm and dry.  Intact. Psych: Confused. Musc: Mild LE edema. No tenderness in extremities. Neuro: Alert and oriented x1 Motor: LUE 0/5 proximal to distal LLE: HF, KE 3/5, ADF 1+/5.  Assessment/Plan: 1. Functional deficits secondary to right thalamocapsular hemorrhage which require 3+ hours per day of interdisciplinary therapy in a comprehensive inpatient rehab setting.  Physiatrist is providing close team supervision and 24 hour management of active medical problems listed below.  Physiatrist and rehab team continue to assess barriers to discharge/monitor patient progress toward functional and medical goals  Care Tool:  Bathing    Body parts bathed by patient: Left arm, Chest, Abdomen, Face   Body parts bathed by helper: Right arm, Front perineal area, Buttocks, Left upper leg, Right lower leg     Bathing assist Assist Level: Minimal Assistance - Patient > 75%     Upper Body Dressing/Undressing Upper  body dressing Upper body dressing/undressing activity did not occur (including orthotics): Refused (asked. pt did not want to change shirt. ) What is the patient wearing?: Pull over shirt    Upper body assist Assist Level: Moderate Assistance - Patient 50 - 74%    Lower Body Dressing/Undressing Lower body dressing      What is the patient wearing?: Pants     Lower body assist Assist for lower body dressing: Maximal Assistance - Patient 25 - 49% (supine)     Toileting Toileting Toileting Activity did not occur (Clothing management and hygiene only): N/A (no void or bm)  Toileting assist Assist for toileting: Dependent - Patient 0%     Transfers Chair/bed transfer  Transfers assist  Chair/bed transfer activity did not occur: Safety/medical concerns  Chair/bed transfer assist level: Maximal Assistance - Patient 25 - 49%     Locomotion Ambulation   Ambulation assist   Ambulation activity did not occur: Safety/medical concerns  Assist level: 2 helpers Assistive device: Walker-rolling Max distance: 15 ft   Walk 10 feet activity   Assist  Walk 10 feet activity did not occur: Safety/medical concerns  Assist level: 2 helpers Assistive device: Walker-rolling   Walk 50 feet activity   Assist Walk 50 feet with 2 turns activity did not occur: Safety/medical concerns  Assist level: 2 helpers Assistive device: Hand held assist    Walk 150 feet activity   Assist Walk 150 feet activity did not occur: Safety/medical concerns         Walk 10 feet on uneven surface  activity   Assist Walk 10 feet  on uneven surfaces activity did not occur: Safety/medical concerns         Wheelchair     Assist Will patient use wheelchair at discharge?: Yes Type of Wheelchair: Manual Wheelchair activity did not occur: Safety/medical concerns  Wheelchair assist level: Moderate Assistance - Patient 50 - 74% Max wheelchair distance: 150 ft    Wheelchair 50 feet with  2 turns activity    Assist    Wheelchair 50 feet with 2 turns activity did not occur: Safety/medical concerns   Assist Level: Moderate Assistance - Patient 50 - 74%   Wheelchair 150 feet activity     Assist  Wheelchair 150 feet activity did not occur: Safety/medical concerns   Assist Level: Moderate Assistance - Patient 50 - 74%   Blood pressure (!) 90/51, pulse 75, temperature 97.6 F (36.4 C), resp. rate 15, height 5\' 3"  (1.6 m), weight 78.3 kg, SpO2 99 %.  Medical Problem List and Plan: 1.Left side hemiparesis/dysarthriasecondary to rightthalamocapsularhemorrhage most likely related to hypertensive crisis as well as old left temporal parietal infarction with small vessel disease  Continue CIR  -WHO LUE, PRAFO 2. Antithrombotics: -DVT/anticoagulation:SCD -antiplatelet therapy: N/A 3. Pain Management:Tylenol as needed  Controlled on 7/23 4. Mood:Zoloft 25 mg daily -antipsychotic agents: Geodon 40 mg nightly -mood seems for the most part well controlled 5. Neuropsych: This patientis notcapable of making decisions on hisown behalf. 6. Skin/Wound Care:local care to hand, elevate LUE while in bed/chair 7. Fluids/Electrolytes/Nutrition:.  -protein supp 8. Post stroke dysphagia. Dysphagia #3 nectar thick liquids. Advance perspeech therapy  9. History of seizure disorder. Vimpat 200 mg twice daily, Keppra 1500 mg twice daily, Depakote 500 mg twice daily.  10. Hypertension. Coreg 3.125 mg twice daily.    Labile on 7/23, monitor for trend 11. PAF. Cardiac rate controlled, regular currently -No anticoagulationdue to history of left MCA infarction with hemorrhagic conversion 12. Hyperlipidemia. Resumed Lipitor   13. Obesity. BMI 30. Dietary follow-up 14. CAD with CABG 2019. No chest pain or shortness of breath. 15.Mild thrombocytopenia: Resolved 16. Neurogenic bladder:   - I/O cath prn, monitor  PVR's, increased checks to every 6 horus  -OOB to void  -pt completed 5 days of macrodantin for 80k staph epi  Continue same, repeat UA/urine culture ordered  LOS: 11 days A FACE TO FACE EVALUATION WAS PERFORMED  Sylvi Rybolt 2020 08/06/2019, 9:05 AM

## 2019-08-06 NOTE — Progress Notes (Signed)
Speech Language Pathology Daily Session Note  Patient Details  Name: [name] Cruz MRN: [id number] Date of Birth: 06-Sep-1955  Today's Date: 08/06/2019 SLP Individual Time: 9629-5284 SLP Individual Time Calculation (min): 55 min  Short Term Goals: Week 2: SLP Short Term Goal 1 (Week 2): Patient will consume trials of thin liquids with only supervision verbal cues needed to minimize oral holding or "swishing" of liquids prior to the swallow without overt s/s of aspiration over 2 sessions prior to repeat MBS. SLP Short Term Goal 2 (Week 2): Patient will utilize external aids for recall of time and place with Mod A verbal and visual cues. SLP Short Term Goal 3 (Week 2): Patient will follow 1-step commands during functional tasks with Mod A verbal and visual cues. SLP Short Term Goal 4 (Week 2): Patient will demonstrate basic problem solving for functional and familiar tasks with Mod A verbal and visual cues. SLP Short Term Goal 5 (Week 2): Patient will participate in structured language tasks and improve word-finding/naming to 75% accuracy and Mod A multimodal cues. SLP Short Term Goal 6 (Week 2): Patient will verbalize wants/needs at the sentence level with Min A multimodal cues for word-finidng.  Skilled Therapeutic Interventions:   Skilled treatment session focused on addressing dysphagia and cognitive-linguistic goals. With setup assistance, patient completed oral care by brushing teeth and followed cues to swish and spit water. SLP administered teaspoon sips of water with cues to "swallow fast". Patient started briefly holding and swishing water, but subsequent 4-5 sips he swallowed immediately. No overt s/s of aspiration or penetration observed and patient's voice remained clear throughout. SLP facilitated use of visual aid for time orientation with calendar. Patient would perseverate on numbers when using and required visual cues to direct him to location on calendar for answering questions  (what day of week, what year, etc). Patient did demonstrate ability delayed recall of date when SLP presented calendar again 2-3 minutes later. Patient completed portion of [employer], naming first two pictures without cues, but for subsequent pictures, he exhibited circumlocution, describing, ie for 'toothbrush', "for your tooth, a brush, a comb". He would become perseverative on words and become tangential, for example when trying to name 'canoe' said, "in a pool" then "pool table" then "I had a pool table". He did not exhibit awareness to his errors even when clinician discussed them. Patient left sitting upright in wheelchair with alarm on, brakes locked and all needs within reach. Continue with current plan of care.  Pain Pain Assessment Pain Score: 0-No pain  Therapy/Group: Individual Therapy  Angela Nevin, MA, CCC-SLP Speech Therapy

## 2019-08-06 NOTE — Progress Notes (Signed)
Occupational Therapy Session Note  Patient Details  Name: [name] Cruz MRN: [id number] Date of Birth: May 05, 1955  Today's Date: 08/06/2019 OT Individual Time: 0732-0802 OT Individual Time Calculation (min): 30 min   Short Term Goals: Week 2:  OT Short Term Goal 1 (Week 2): Pt will demonstrate improved awareness of L UE by placing L arm into shirt sleeve with mod verbal cues OT Short Term Goal 2 (Week 2): Pt will complete sit<>stand with max A of 1 in preparation for BADL tasks OT Short Term Goal 3 (Week 2): Pt will maintain sitting balance seated EOB with no more than mod A for 3 minutes in preparation for BADL task.  Skilled Therapeutic Interventions/Progress Updates:    Pt greeted semi-reclined in bed and agreeable to OT treatment session. Pt needed max A to thread pants in supine but followed commands more accurately to lift R and L leg into pants. Pt assisted with pulling pants up some on R side, but needed mod A for rolling for OT to pull pants the rest of the way up hips. Pt came to sitting EOB with max A. Mod A of 1 stand-pivot to wc on stronger R side. OT set pt up for breakfast, Worked on word finding and L visual scanning to locate and name food items on tray. Pt was able to scan L and locate fork with min verbal cues. He was able to accurately name 1/4 breakfast items on plate. Pt needed min cues to slow down and check for pocketing. OT placed SAEBO e-stim on wrist extensors. SAEBO left on for 60 minutes. OT returned to remove SAEBO with skin intact and no adverse reactions.  [place] Stim One 330 pulse width 35 Hz pulse rate On 8 sec/ off 8 sec Ramp up/ down 2 sec Symmetrical Biphasic wave form  Max intensity 152m at 500 Ohm load  Pt left seated in wc with alarm belt on, call bell in reach, and needs met.  Therapy Documentation Precautions:  Precautions Precautions: Fall Precaution Comments: L hemi, L inattention, pusher Restrictions Weight Bearing Restrictions: No Pain:   denies pain  Therapy/Group: Individual Therapy  [name]7/23/2021, 7:57 AM

## 2019-08-07 LAB — URINALYSIS, COMPLETE (UACMP) WITH MICROSCOPIC
Bacteria, UA: NONE SEEN
Bilirubin Urine: NEGATIVE
Glucose, UA: NEGATIVE mg/dL
Hgb urine dipstick: NEGATIVE
Ketones, ur: 5 mg/dL — AB
Leukocytes,Ua: NEGATIVE
Nitrite: NEGATIVE
Protein, ur: NEGATIVE mg/dL
Specific Gravity, Urine: 1.016 (ref 1.005–1.030)
pH: 5 (ref 5.0–8.0)

## 2019-08-07 NOTE — Progress Notes (Signed)
Patient is alert and oriented, calm and cooperative. Tolerating food and fluids well. No c/o pain this shift. No acute events to report.

## 2019-08-07 NOTE — Progress Notes (Signed)
[facility] PHYSICAL MEDICINE & REHABILITATION PROGRESS NOTE   Subjective/Complaints: Patient seen sitting up in bed this morning.  He states he slept well overnight.  No reported issues overnight.  He denies complaints.  ROS: Denies CP, SOB, N/V/D  Objective:   No results found. No results for input(s): WBC, HGB, HCT, PLT in the last 72 hours. No results for input(s): NA, K, CL, CO2, GLUCOSE, BUN, CREATININE, CALCIUM in the last 72 hours.  Intake/Output Summary (Last 24 hours) at 08/07/2019 1007 Last data filed at 08/07/2019 0654 Gross per 24 hour  Intake 300 ml  Output 950 ml  Net -650 ml     Physical Exam: Vital Signs Blood pressure 127/78, pulse 71, temperature (!) 97.5 F (36.4 C), temperature source Oral, resp. rate 16, height 5\' 3"  (1.6 m), weight 78.3 kg, SpO2 99 %. Constitutional: No distress . Vital signs reviewed. HENT: Normocephalic.  Atraumatic. Eyes: EOMI. No discharge. Cardiovascular: No JVD. Respiratory: Normal effort.  No stridor. GI: Non-distended. Skin: Warm and dry.  Intact. Psych: Confused. Musc: No edema in extremities.  No tenderness in extremities. Neuro: Alert and oriented x1 Motor: LUE 0/5 proximal to distal, unchanged LLE: HF, KE 3/5, ADF 1+/5.  Assessment/Plan: 1. Functional deficits secondary to right thalamocapsular hemorrhage which require 3+ hours per day of interdisciplinary therapy in a comprehensive inpatient rehab setting.  Physiatrist is providing close team supervision and 24 hour management of active medical problems listed below.  Physiatrist and rehab team continue to assess barriers to discharge/monitor patient progress toward functional and medical goals  Care Tool:  Bathing    Body parts bathed by patient: Left arm, Chest, Abdomen, Face   Body parts bathed by helper: Right arm, Front perineal area, Buttocks, Left upper leg, Right lower leg     Bathing assist Assist Level: Minimal Assistance - Patient > 75%     Upper  Body Dressing/Undressing Upper body dressing Upper body dressing/undressing activity did not occur (including orthotics): Refused (asked. pt did not want to change shirt. ) What is the patient wearing?: Pull over shirt    Upper body assist Assist Level: Moderate Assistance - Patient 50 - 74%    Lower Body Dressing/Undressing Lower body dressing      What is the patient wearing?: Pants     Lower body assist Assist for lower body dressing: Maximal Assistance - Patient 25 - 49% (supine)     Toileting Toileting Toileting Activity did not occur (Clothing management and hygiene only): N/A (no void or bm)  Toileting assist Assist for toileting: Dependent - Patient 0%     Transfers Chair/bed transfer  Transfers assist  Chair/bed transfer activity did not occur: Safety/medical concerns  Chair/bed transfer assist level: Maximal Assistance - Patient 25 - 49%     Locomotion Ambulation   Ambulation assist   Ambulation activity did not occur: Safety/medical concerns  Assist level: 2 helpers Assistive device: Walker-rolling Max distance: 15 ft   Walk 10 feet activity   Assist  Walk 10 feet activity did not occur: Safety/medical concerns  Assist level: 2 helpers Assistive device: Walker-rolling   Walk 50 feet activity   Assist Walk 50 feet with 2 turns activity did not occur: Safety/medical concerns  Assist level: 2 helpers Assistive device: Hand held assist    Walk 150 feet activity   Assist Walk 150 feet activity did not occur: Safety/medical concerns         Walk 10 feet on uneven surface  activity   Assist  Walk 10 feet on uneven surfaces activity did not occur: Safety/medical concerns         Wheelchair     Assist Will patient use wheelchair at discharge?: Yes Type of Wheelchair: Manual Wheelchair activity did not occur: Safety/medical concerns  Wheelchair assist level: Maximal Assistance - Patient 25 - 49% Max wheelchair distance: 150 ft     Wheelchair 50 feet with 2 turns activity    Assist    Wheelchair 50 feet with 2 turns activity did not occur: Safety/medical concerns   Assist Level: Maximal Assistance - Patient 25 - 49%   Wheelchair 150 feet activity     Assist  Wheelchair 150 feet activity did not occur: Safety/medical concerns   Assist Level: Maximal Assistance - Patient 25 - 49%   Blood pressure 127/78, pulse 71, temperature (!) 97.5 F (36.4 C), temperature source Oral, resp. rate 16, height 5\' 3"  (1.6 m), weight 78.3 kg, SpO2 99 %.  Medical Problem List and Plan: 1.Left side hemiparesis/dysarthriasecondary to rightthalamocapsularhemorrhage most likely related to hypertensive crisis as well as old left temporal parietal infarction with small vessel disease  Continue CIR  -WHO LUE, PRAFO 2. Antithrombotics: -DVT/anticoagulation:SCD -antiplatelet therapy: N/A 3. Pain Management:Tylenol as needed  Controlled on 7/24 4. Mood:Zoloft 25 mg daily -antipsychotic agents: Geodon 40 mg nightly -mood seems for the most part well controlled 5. Neuropsych: This patientis notcapable of making decisions on hisown behalf. 6. Skin/Wound Care:local care to hand, elevate LUE while in bed/chair 7. Fluids/Electrolytes/Nutrition:.  -protein supp 8. Post stroke dysphagia. Dysphagia #3 nectar thick liquids. Advance perspeech therapy  9. History of seizure disorder. Vimpat 200 mg twice daily, Keppra 1500 mg twice daily, Depakote 500 mg twice daily.  10. Hypertension. Coreg 3.125 mg twice daily.    Controlled on 7/24 11. PAF.  -No anticoagulationdue to history of left MCA infarction with hemorrhagic conversion 12. Hyperlipidemia. Resumed Lipitor   13. Obesity. BMI 30. Dietary follow-up 14. CAD with CABG 2019. No chest pain or shortness of breath. 15.Mild thrombocytopenia: Resolved 16. Neurogenic bladder:   - I/O cath prn, monitor  PVR's, increased checks to every 6 horus  -OOB to void  -pt completed 5 days of macrodantin for 80k staph epi  Continue same, repeat UA negative, urine culture pending  LOS: 12 days A FACE TO FACE EVALUATION WAS PERFORMED  [name] Cruz 2020 08/07/2019, 10:07 AM

## 2019-08-08 ENCOUNTER — Inpatient Hospital Stay (HOSPITAL_COMMUNITY): Payer: Medicare Other | Admitting: Occupational Therapy

## 2019-08-08 ENCOUNTER — Inpatient Hospital Stay (HOSPITAL_COMMUNITY): Payer: Medicare Other

## 2019-08-08 LAB — URINE CULTURE: Culture: NO GROWTH

## 2019-08-08 NOTE — Plan of Care (Signed)
  Problem: Consults Goal: RH GENERAL PATIENT EDUCATION Description: See Patient Education module for education specifics. Outcome: Progressing Goal: Skin Care Protocol Initiated - if Braden Score 18 or less Description: If consults are not indicated, leave blank or document N/A Outcome: Progressing Goal: Nutrition Consult-if indicated Outcome: Progressing   Problem: RH BOWEL ELIMINATION Goal: RH STG MANAGE BOWEL WITH ASSISTANCE Description: STG Manage Bowel with mod I Assistance. Outcome: Progressing Goal: RH STG MANAGE BOWEL W/MEDICATION W/ASSISTANCE Description: STG Manage Bowel with Medication with mod I Assistance. Outcome: Progressing   Problem: RH BLADDER ELIMINATION Goal: RH STG MANAGE BLADDER WITH ASSISTANCE Description: STG Manage Bladder With min Assistance Outcome: Progressing Goal: RH STG MANAGE BLADDER WITH EQUIPMENT WITH ASSISTANCE Description: STG Manage Bladder With Equipment With min Assistance Outcome: Progressing   Problem: RH SAFETY Goal: RH STG ADHERE TO SAFETY PRECAUTIONS W/ASSISTANCE/DEVICE Description: STG Adhere to Safety Precautions With cues and reminders Outcome: Progressing Goal: RH STG DECREASED RISK OF FALL WITH ASSISTANCE Description: STG Decreased Risk of Fall With cues and reminders  Outcome: Progressing   Problem: RH PAIN MANAGEMENT Goal: RH STG PAIN MANAGED AT OR BELOW PT'S PAIN GOAL Description: Less than 4 on scale of 0-10 Outcome: Progressing   Problem: Consults Goal: RH STROKE PATIENT EDUCATION Description: See Patient Education module for education specifics  Outcome: Progressing

## 2019-08-08 NOTE — Progress Notes (Signed)
Occupational Therapy Session Note  Patient Details  Name: [name] Cruz MRN: [id number] Date of Birth: 15-Jul-1955  Today's Date: 08/08/2019 OT Individual Time: 1000-1042 OT Individual Time Calculation (min): 42 min   Skilled Therapeutic Interventions/Progress Updates:    Pt greeted in the w/c, asleep, easily woken and agreeable to tx. Declining participation in grooming tasks at the sink this AM, reporting soreness in his Lt arm which he attributed to "all those IVs." Worked on gentle ROM and stretching to decrease soreness and swelling. He did not want anything medicinally from RN to address soreness. OT performed gentle ROM and stretching to the Lt side, demonstrating and explaining to pt using simple language the proper techniques. Note that pt exhibits tightness of forearm supinators and also shoulder external rotators. Due to swelling, we were unable to passively close his hand fully. Had pt then complete self ROM of shoulder, elbow, wrist, and digits. He required [facility] manual cuing for carryover of proper techniques. Worked extensively on joint protection as pt lifts his flaccid arm by the digits. At end of session, pt still needed cues for lifting Lt arm by wrist vs digits. His mother called during session and was agreeable to call him back after therapy. Educated pt on technique for answering his phone when she called again with good carryover, notified NT to supervise him when mother called back. To work on [employer] attention and Lt limb identification had pt sanitize and apply lotion to affected arm afterwards. Pt during session often grabbed and squeezed OT's hand, reporting that it was his hand. At end of tx pts Lt limb was elevated using pillows for edema mgt. Left him with all needs within reach and safety belt fastened.   Therapy Documentation Precautions:  Precautions Precautions: Fall Precaution Comments: L hemi, L inattention, pusher Restrictions Weight Bearing Restrictions:  No ADL: ADL Eating: Minimal assistance Grooming: Maximal assistance Upper Body Bathing: Maximal assistance Lower Body Bathing: Maximal assistance Upper Body Dressing: Moderate assistance Lower Body Dressing: Maximal assistance Toileting: Unable to assess Toilet Transfer: Maximal assistance (+2 using Stedy)      Therapy/Group: Individual Therapy  Jaelyn Bourgoin A Prescious Hurless 08/08/2019, 4:25 PM

## 2019-08-08 NOTE — Progress Notes (Signed)
[place] PHYSICAL MEDICINE & REHABILITATION PROGRESS NOTE   Subjective/Complaints: Patient seen sitting up in bed this morning.  He states he slept well overnight.  No reported issues overnight.  ROS: Denies CP, SOB, N/V/D  Objective:   No results found. No results for input(s): WBC, HGB, HCT, PLT in the last 72 hours. No results for input(s): NA, K, CL, CO2, GLUCOSE, BUN, CREATININE, CALCIUM in the last 72 hours.  Intake/Output Summary (Last 24 hours) at 08/08/2019 0832 Last data filed at 08/08/2019 0432 Gross per 24 hour  Intake 440 ml  Output 1575 ml  Net -1135 ml     Physical Exam: Vital Signs Blood pressure (!) 134/76, pulse 80, temperature 97.7 F (36.5 C), temperature source Oral, resp. rate 18, height 5\' 3"  (1.6 m), weight 78.3 kg, SpO2 98 %. Constitutional: No distress . Vital signs reviewed. HENT: Normocephalic.  Atraumatic. Eyes: EOMI. No discharge. Cardiovascular: No JVD.   Respiratory: Normal effort.  No stridor.  Bilaterally clear to auscultation GI: Non-distended.  BS +.  Skin: Warm and dry.  Intact. Psych: Normal mood.  Normal behavior. Musc: No edema in extremities.  No tenderness in extremities. Psych: Slowed. Musc: No edema in extremities.  No tenderness in extremities. Neuro: Alert and oriented x1 Motor: LUE 0/5 proximal to distal, unchanged LLE: HF, KE 3/5, ADF 1+/5, some improvement.  Assessment/Plan: 1. Functional deficits secondary to right thalamocapsular hemorrhage which require 3+ hours per day of interdisciplinary therapy in a comprehensive inpatient rehab setting.  Physiatrist is providing close team supervision and 24 hour management of active medical problems listed below.  Physiatrist and rehab team continue to assess barriers to discharge/monitor patient progress toward functional and medical goals  Care Tool:  Bathing    Body parts bathed by patient: Left arm, Chest, Abdomen, Face   Body parts bathed by helper: Right arm, Front  perineal area, Buttocks, Left upper leg, Right lower leg     Bathing assist Assist Level: Minimal Assistance - Patient > 75%     Upper Body Dressing/Undressing Upper body dressing Upper body dressing/undressing activity did not occur (including orthotics): Refused (asked. pt did not want to change shirt. ) What is the patient wearing?: Pull over shirt    Upper body assist Assist Level: Moderate Assistance - Patient 50 - 74%    Lower Body Dressing/Undressing Lower body dressing      What is the patient wearing?: Pants     Lower body assist Assist for lower body dressing: Maximal Assistance - Patient 25 - 49% (supine)     Toileting Toileting Toileting Activity did not occur (Clothing management and hygiene only): N/A (no void or bm)  Toileting assist Assist for toileting: Set up assist (Urinal)     Transfers Chair/bed transfer  Transfers assist  Chair/bed transfer activity did not occur: Safety/medical concerns  Chair/bed transfer assist level: Maximal Assistance - Patient 25 - 49%     Locomotion Ambulation   Ambulation assist   Ambulation activity did not occur: Safety/medical concerns  Assist level: 2 helpers Assistive device: Walker-rolling Max distance: 15 ft   Walk 10 feet activity   Assist  Walk 10 feet activity did not occur: Safety/medical concerns  Assist level: 2 helpers Assistive device: Walker-rolling   Walk 50 feet activity   Assist Walk 50 feet with 2 turns activity did not occur: Safety/medical concerns  Assist level: 2 helpers Assistive device: Hand held assist    Walk 150 feet activity   Assist Walk 150 feet  activity did not occur: Safety/medical concerns         Walk 10 feet on uneven surface  activity   Assist Walk 10 feet on uneven surfaces activity did not occur: Safety/medical concerns         Wheelchair     Assist Will patient use wheelchair at discharge?: Yes Type of Wheelchair: Manual Wheelchair  activity did not occur: Safety/medical concerns  Wheelchair assist level: Maximal Assistance - Patient 25 - 49% Max wheelchair distance: 150 ft    Wheelchair 50 feet with 2 turns activity    Assist    Wheelchair 50 feet with 2 turns activity did not occur: Safety/medical concerns   Assist Level: Maximal Assistance - Patient 25 - 49%   Wheelchair 150 feet activity     Assist  Wheelchair 150 feet activity did not occur: Safety/medical concerns   Assist Level: Maximal Assistance - Patient 25 - 49%   Blood pressure (!) 134/76, pulse 80, temperature 97.7 F (36.5 C), temperature source Oral, resp. rate 18, height 5\' 3"  (1.6 m), weight 78.3 kg, SpO2 98 %.  Medical Problem List and Plan: 1.Left side hemiparesis/dysarthriasecondary to rightthalamocapsularhemorrhage most likely related to hypertensive crisis as well as old left temporal parietal infarction with small vessel disease  Continue CIR  -WHO LUE, PRAFO nightly 2. Antithrombotics: -DVT/anticoagulation:SCD -antiplatelet therapy: N/A 3. Pain Management:Tylenol as needed  Controlled on 7/25 4. Mood:Zoloft 25 mg daily -antipsychotic agents: Geodon 40 mg nightly -mood seems for the most part well controlled 5. Neuropsych: This patientis notcapable of making decisions on hisown behalf. 6. Skin/Wound Care:local care to hand, elevate LUE while in bed/chair 7. Fluids/Electrolytes/Nutrition:.  -protein supp 8. Post stroke dysphagia. Dysphagia #3 nectar thick liquids. Advance perspeech therapy  9. History of seizure disorder. Vimpat 200 mg twice daily, Keppra 1500 mg twice daily, Depakote 500 mg twice daily.  10. Hypertension. Coreg 3.125 mg twice daily.    Controlled on 7/25 11. PAF.  -No anticoagulationdue to history of left MCA infarction with hemorrhagic conversion 12. Hyperlipidemia. Resumed Lipitor   13. Obesity. BMI 30. Dietary  follow-up 14. CAD with CABG 2019. No chest pain or shortness of breath. 15.Mild thrombocytopenia: Resolved 16. Neurogenic bladder:   - I/O cath prn, monitor PVR's, increased checks to every 6 horus  -OOB to void  -pt completed 5 days of macrodantin for 80k staph epi  Continue same, repeat UA negative, urine culture remains pending  LOS: 13 days A FACE TO FACE EVALUATION WAS PERFORMED  Caroline Matters 2020 08/08/2019, 8:32 AM

## 2019-08-08 NOTE — Progress Notes (Addendum)
Physical Therapy Session Note  Patient Details  Name: [name] Cruz MRN: [id number] Date of Birth: 07-15-1955  Today's Date: 08/08/2019 PT Individual Time: 0800-0900; 1445-1520 PT Individual Time Calculation (min): 60 min , 35 min  Short Term Goals: Week 2:  PT Short Term Goal 1 (Week 2): Pt will consistently complete bed<>w/c transfers with LRAD & mod assist +1. PT Short Term Goal 2 (Week 2): Pt will ambulate 20 ft with LRAD & mod assist +1. PT Short Term Goal 3 (Week 2): Pt will consistently complete bed mobility with mod assist +1. PT Short Term Goal 4 (Week 2): Pt will propel w/c 40 ft with supervision.      Skilled Therapeutic Interventions/Progress Updates:  tx 1:  Pt resting in bed, having just finished breakfast.  He denied pain.  In flat bed, rolling R with mod assist; pt did initiate movement with LLE to assist.  R side lying to sitting with mod assist, slowly, with max cues and demo.  neuromuscular re-education via demo, multimodal  Cues for midline orientation, with R/L  lateral leans without use of UEs.  No LOB.  Pt sat EOB without use of UEs with cues for balance, x 2 minutes.  Squat pivot bed> w/c to R, pt pulling with R hand on armrest to prevent pusher tendencies.  Max assist to complete.  W/c propulsion using hemi method over level tile, x 150' with occasional min assist for steering.  Pillow behind back and shoes on for better use of RLE.  Gait training with weighted grocery cart, x 18' with max assist for wt shifting to R before stepping LLE; wc follow for safety.  Pt cleared L foot independently q step.  At end of session, pt seated in w/c with seat belt alarm set and needs at hand.  tx 2:  Pt resting in bed; he denied pain.  Pt's sisters present during session.  In flat bed, supine> sitting to R with min assist, max cues, extra time. Multimodal cues for midline orientation in sitting, without use of UE to decrease pushing.  Sit> stand in stedy with min  assist.  Blocked practice for sit> stand with RUE>without RUE, while reaching for sister's hand or 12 grooming items,  for spontaneous wt shifting to R without pushing.  PT educated sisters regarding midline orientation and hemi method of w/c propulsion. His sister would like to take him home if possible.  W/c propulsion using hemi method and rare min assist for steering, x 100' on level tile.  At end of session, pt in w/c iwht seat belt alarm set and sisters coaching him on w/c propulsion. PT informed family to ask for help when pt is tired of propelling.  Sisters voiced understanding.       Therapy Documentation Precautions:  Precautions Precautions: Fall Precaution Comments: L hemi, L inattention, pusher Restrictions Weight Bearing Restrictions: No Pain: Pain Assessment Pain Scale: 0-10 Pain Score: 0-No pain  AM, PM      Therapy/Group: Individual Therapy  Dolores Mcgovern 08/08/2019, 10:22 AM

## 2019-08-09 ENCOUNTER — Inpatient Hospital Stay (HOSPITAL_COMMUNITY): Payer: Medicare Other | Admitting: Occupational Therapy

## 2019-08-09 ENCOUNTER — Telehealth: Payer: Self-pay | Admitting: Family Medicine

## 2019-08-09 ENCOUNTER — Inpatient Hospital Stay (HOSPITAL_COMMUNITY): Payer: Medicare Other | Admitting: Physical Therapy

## 2019-08-09 MED ORDER — HYDROCODONE-ACETAMINOPHEN 5-325 MG PO TABS: 1.0000 | ORAL_TABLET | Freq: Two times a day (BID) | ORAL | Status: DC | PRN

## 2019-08-09 NOTE — Progress Notes (Signed)
Occupational Therapy Weekly Progress Note  Patient Details  Name: [name] Cruz MRN: [id number] Date of Birth: [birth year]  Beginning of progress report period: July 27, 2019 End of progress report period: August 09, 2019  Today's Date: 08/09/2019 OT Individual Time: 1047-1200 OT Individual Time Calculation (min): 73 min   Patient has met 3 of 3 short term goals.  Pt is making slow, but steady progress towards OT goals. Pt is able to transfer with as little as mod A, but is more consistently max A. Pt has demonstrated improved attention and awareness to L side this week within functional tasks.   Patient continues to demonstrate the following deficits: muscle weakness, decreased cardiorespiratoy endurance, impaired timing and sequencing, abnormal tone, unbalanced muscle activation, motor apraxia, ataxia, decreased coordination and decreased motor planning, decreased midline orientation, decreased attention to left, left side neglect, decreased motor planning and ideational apraxia, decreased initiation, decreased attention, decreased awareness, decreased problem solving, decreased safety awareness, decreased memory and delayed processing and decreased sitting balance, decreased standing balance, decreased postural control, hemiplegia and decreased balance strategies and therefore will continue to benefit from skilled OT intervention to enhance overall performance with BADL and Reduce care partner burden.  Patient not progressing toward long term goals.  See goal revision..  Plan of care revisions: Goals downgraded to mod/Max A.  OT Short Term Goals Week 2:  OT Short Term Goal 1 (Week 2): Pt will demonstrate improved awareness of L UE by placing L arm into shirt sleeve with mod verbal cues OT Short Term Goal 1 - Progress (Week 2): Met OT Short Term Goal 2 (Week 2): Pt will complete sit<>stand with max A of 1 in preparation for BADL tasks OT Short Term Goal 2 - Progress (Week 2): Met OT Short Term  Goal 3 (Week 2): Pt will maintain sitting balance seated EOB with no more than mod A for 3 minutes in preparation for BADL task. OT Short Term Goal 3 - Progress (Week 2): Met Week 3:  OT Short Term Goal 1 (Week 3): Pt will demonstrate self ROM with min verbal cues OT Short Term Goal 2 (Week 3): Pt will complete toilet transfer with max A of 1 OT Short Term Goal 3 (Week 3): Pt will tolerate sitting unsupported for 10 minutes with no more than min A for static balance  Skilled Therapeutic Interventions/Progress Updates:    Pt greeted seated in wc and agreeable to OT treatment session. Pt declined bathing/dressing this morning. Pt was holding L hand with R hand when OT entered showing improved awareness of L side. Pt brought to therapy gym and completed stand-pivot to therapy mat with max A of 1. Pt brought into sidelying for L UE NMR in gravity eliminated position. Used arm skate and manual facilitation to achieve elbow flex/ext and shooulder flex/ext. Pt with some flexor synergy activation noted, but inconsistently firing muscles. Pt needed constant cues to not try to use R hand to help. OT gave pt wash cloths to hold onto R hand with to decreased distraction witH R hand. OT placed coband on L hand to maintain grasp on plastic dowel rod. OT support at L elbow to achieve chest press and supine biceps press for BUE activation. Pt transferred back to wc with max A of 1 stand-pivot. OT removed kinesiotape from L hand for skin check, and re-appleid K-tape for swelling and L attention. OT placed SAEBO e-stim for shoulder subluxation. SAEBO left on for 60 minutes. OT returned to remove SAEBO  with skin intact and no adverse reactions.  [place] Stim One 330 pulse width 35 Hz pulse rate On 8 sec/ off 8 sec Ramp up/ down 2 sec Symmetrical Biphasic wave form  Max intensity 134m at 500 Ohm load  Pt left seated in wc in room with chair alarm on, call bell in reach, and needs met.  Therapy  Documentation Precautions:  Precautions Precautions: Fall Precaution Comments: L hemi, L inattention, pusher Restrictions Weight Bearing Restrictions: No Pain: Pain Assessment Pain Scale: 0-10 Pain Score: 0-No pain   Therapy/Group: Individual Therapy  [name]7/26/2021, 11:57 AM

## 2019-08-09 NOTE — Telephone Encounter (Signed)
See below

## 2019-08-09 NOTE — Progress Notes (Signed)
Physical Therapy Session Note  Patient Details  Name: [name] Cruz MRN: [id number] Date of Birth: 10/30/1955  Today's Date: 08/09/2019 PT Individual Time: 1419-1500 PT Individual Time Calculation (min): 41 min   Short Term Goals: Week 2:  PT Short Term Goal 1 (Week 2): Pt will consistently complete bed<>w/c transfers with LRAD & mod assist +1. PT Short Term Goal 2 (Week 2): Pt will ambulate 20 ft with LRAD & mod assist +1. PT Short Term Goal 3 (Week 2): Pt will consistently complete bed mobility with mod assist +1. PT Short Term Goal 4 (Week 2): Pt will propel w/c 40 ft with supervision.  Skilled Therapeutic Interventions/Progress Updates:  Pt received in w/c requesting water to drink. PT provided pt with thickened water & pt sipped on it with cuing for small sips during session. Transported pt to/from gym via w/c dependent assist for time management. Sit>stand with cuing for hand placement and PT attempting to manually facilitate anterior/R weight shift but pt unaware of cuing; pt requires max assist for sit>stand. Gait with RW & L hand orthosis with max assist with mirror for visual feedback & +2 for w/c follow for safety. PT provides MAX cuing for midline orientation as pt pushing strongly to L with RLE with no weight shifting R unless PT manually facilitates this. Pt is able to advance LLE but requires PT assist to place it with increased step width as pt with narrow step with LLE. Pt appears internally distracted during activity with decreased ability to follow cues/commands. PT assists pt with stand pivot w/c<>kinetron with max assist & 2nd person for safety with RUE placed on armrests & cuing to pull. Pt with decreased ability to step & advance BLE and inability to weight shift with PT facilitating most movements. Attempted to have pt perform kinetron in standing with BUE support (PT assisting LUE) with mirror for visual feedback with PT providing facilitation for weight shifting R but pt  with significantly impaired ability to follow commands, motor plan, and attend to instructions/cues so after multiple attempts activity was terminated. At end of session pt left in w/c with chair alarm donned & call bell in reach. Thickened water in reach as pt now only requires intermittent supervision per safety plan in room.  Therapy Documentation Precautions:  Precautions Precautions: Fall Precaution Comments: L hemi, L inattention, pusher Restrictions Weight Bearing Restrictions: No  Pain: No c/o or behaviors demonstrating pain.   Therapy/Group: Individual Therapy  [name] Cruz 08/09/2019, 4:11 PM

## 2019-08-09 NOTE — Progress Notes (Addendum)
[place] PHYSICAL MEDICINE & REHABILITATION PROGRESS NOTE   Subjective/Complaints:  Pt reports initially that R foot was bothering him- which was new- however when examined R foot- he said he meant to say his low back is bothering him from being in bed so much-  Asking for additional pain meds to help. Is currently only on Tylenol prn.   York Spaniel it started today.    ROS:  Pt denies SOB, abd pain, CP, N/V/C/D, and vision changes   Objective:   No results found. No results for input(s): WBC, HGB, HCT, PLT in the last 72 hours. No results for input(s): NA, K, CL, CO2, GLUCOSE, BUN, CREATININE, CALCIUM in the last 72 hours.  Intake/Output Summary (Last 24 hours) at 08/09/2019 1336 Last data filed at 08/09/2019 1010 Gross per 24 hour  Intake 660 ml  Output 300 ml  Net 360 ml     Physical Exam: Vital Signs Blood pressure 104/76, pulse 89, temperature 98.4 F (36.9 C), temperature source Oral, resp. rate 16, height 5\' 3"  (1.6 m), weight 78.3 kg, SpO2 91 %. Constitutional: No distress . Vital signs reviewed. Sitting up in bed- watching TV; appropriate, but has word substitution noted, NAD HENT: Normocephalic.  Atraumatic. Eyes: EOMI. No discharge. Cardiovascular: RRR Respiratory: CTA B/L- no W/R/R- good air movement GI: Soft, NT, ND, (+)BS .  Skin: Warm and dry.  Intact. Psych: vague, talkative Musc: No edema in extremities.  No tenderness in extremities. Musc: No edema in extremities.  No tenderness in extremities. Neuro: Alert and oriented x1- a lot of word substitutions Motor: LUE 0/5 proximal to distal, unchanged LLE: HF, KE 3/5, ADF 1+/5, some improvement.  Assessment/Plan: 1. Functional deficits secondary to right thalamocapsular hemorrhage which require 3+ hours per day of interdisciplinary therapy in a comprehensive inpatient rehab setting.  Physiatrist is providing close team supervision and 24 hour management of active medical problems listed below.  Physiatrist  and rehab team continue to assess barriers to discharge/monitor patient progress toward functional and medical goals  Care Tool:  Bathing    Body parts bathed by patient: Chest, Abdomen, Front perineal area, Right upper leg, Left upper leg, Face   Body parts bathed by helper: Buttocks, Right lower leg, Left lower leg, Right arm, Left arm     Bathing assist Assist Level: Maximal Assistance - Patient 24 - 49%     Upper Body Dressing/Undressing Upper body dressing Upper body dressing/undressing activity did not occur (including orthotics): Refused (asked. pt did not want to change shirt. ) What is the patient wearing?: Pull over shirt    Upper body assist Assist Level: Moderate Assistance - Patient 50 - 74%    Lower Body Dressing/Undressing Lower body dressing      What is the patient wearing?: Pants, Incontinence brief     Lower body assist Assist for lower body dressing: Maximal Assistance - Patient 25 - 49%     Toileting Toileting Toileting Activity did not occur (Clothing management and hygiene only): N/A (no void or bm)  Toileting assist Assist for toileting: Set up assist (Urinal)     Transfers Chair/bed transfer  Transfers assist  Chair/bed transfer activity did not occur: Safety/medical concerns  Chair/bed transfer assist level: Maximal Assistance - Patient 25 - 49%     Locomotion Ambulation   Ambulation assist   Ambulation activity did not occur: Safety/medical concerns  Assist level: 2 helpers Assistive device: Other (comment) (weighted grocery cart) Max distance: 18   Walk 10 feet activity  Assist  Walk 10 feet activity did not occur: Safety/medical concerns  Assist level: 2 helpers Assistive device: Walker-rolling   Walk 50 feet activity   Assist Walk 50 feet with 2 turns activity did not occur: Safety/medical concerns  Assist level: 2 helpers Assistive device: Hand held assist    Walk 150 feet activity   Assist Walk 150 feet  activity did not occur: Safety/medical concerns         Walk 10 feet on uneven surface  activity   Assist Walk 10 feet on uneven surfaces activity did not occur: Safety/medical concerns         Wheelchair     Assist Will patient use wheelchair at discharge?: Yes Type of Wheelchair: Manual Wheelchair activity did not occur: Safety/medical concerns  Wheelchair assist level: Minimal Assistance - Patient > 75% Max wheelchair distance: 150    Wheelchair 50 feet with 2 turns activity    Assist    Wheelchair 50 feet with 2 turns activity did not occur: Safety/medical concerns   Assist Level: Minimal Assistance - Patient > 75%   Wheelchair 150 feet activity     Assist  Wheelchair 150 feet activity did not occur: Safety/medical concerns   Assist Level: Minimal Assistance - Patient > 75%   Blood pressure 104/76, pulse 89, temperature 98.4 F (36.9 C), temperature source Oral, resp. rate 16, height 5\' 3"  (1.6 m), weight 78.3 kg, SpO2 91 %.  Medical Problem List and Plan: 1.Left side hemiparesis/dysarthriasecondary to rightthalamocapsularhemorrhage most likely related to hypertensive crisis as well as old left temporal parietal infarction with small vessel disease  Continue CIR  -WHO LUE, PRAFO nightly 2. Antithrombotics: -DVT/anticoagulation:SCD -antiplatelet therapy: N/A 3. Pain Management:Tylenol as needed  7/26- Will try Tramadol 50 mg BID prn if tylenol not enough- however, can't because has hx of seizures- will give norco prn 4. Mood:Zoloft 25 mg daily -antipsychotic agents: Geodon 40 mg nightly -mood seems for the most part well controlled 5. Neuropsych: This patientis notcapable of making decisions on hisown behalf. 6. Skin/Wound Care:local care to hand, elevate LUE while in bed/chair 7. Fluids/Electrolytes/Nutrition:.  -protein supp 8. Post stroke dysphagia. Dysphagia #3 nectar thick liquids.  Advance perspeech therapy  9. History of seizure disorder. Vimpat 200 mg twice daily, Keppra 1500 mg twice daily, Depakote 500 mg twice daily.  10. Hypertension. Coreg 3.125 mg twice daily.    7/26- BP soft- but controlled- con't meds 11. PAF.  -No anticoagulationdue to history of left MCA infarction with hemorrhagic conversion 12. Hyperlipidemia. Resumed Lipitor   13. Obesity. BMI 30. Dietary follow-up 14. CAD with CABG 2019. No chest pain or shortness of breath. 15.Mild thrombocytopenia: Resolved 16. Neurogenic bladder:   - I/O cath prn, monitor PVR's, increased checks to every 6 horus  -OOB to void  -pt completed 5 days of macrodantin for 80k staph epi  Continue same, repeat UA negative, urine culture remains pending  7/26- No growth on Cx  LOS: 14 days A FACE TO FACE EVALUATION WAS PERFORMED  Shadman Tozzi 08/09/2019, 1:36 PM

## 2019-08-09 NOTE — Progress Notes (Addendum)
Patient ID: [name] Cruz, male   DOB: 1955/01/20, 64 y.o.   MRN: [id number]  SW spoke with pt sister Steward Drone to follow-up on plan of care for patient after speaking with his sister Lupita Leash last week and the concerns she presented about him discharging home with their sister Diane who lives in the home with him. They have decided that SNF placement will be best. Preferred SNF is [facility]; prefers a [facility] SW informed referrals will be sent to [facility]. His sister intends to discuss SNF placement with him.   SNF referral sent out. Pt likely to be screened for Level II PASRR due to mental health hx.   SW spoke with April [facility] [employer] [phone]) to submit  nursing home authorization. Ref ID #[id number]. Sw faxed updated clinicals to above fax. SW waiting on updates.   Cecile Sheerer, MSW, LCSWA Office: [phone] Cell: [phone] Fax: [phone]

## 2019-08-09 NOTE — Telephone Encounter (Signed)
Diane,patient's sister,called.  Patient is in [facility].  Patient's paralyzed.  Diane was concerned about the stool sample kit that patient received.  Diane said patient won't be able to do it.

## 2019-08-09 NOTE — Plan of Care (Signed)
Problem: RH Toileting Goal: LTG Patient will perform toileting task (3/3 steps) with assistance level (OT) Description: LTG: Patient will perform toileting task (3/3 steps) with assistance level (OT)  Outcome: Not Applicable Flowsheets (Taken 08/09/2019 1234) LTG: Pt will perform toileting task (3/3 steps) with assistance level: (dc goal 7/26) -- Note: D/c goal 7/26 due to lack of progress   Problem: RH Tub/Shower Transfers Goal: LTG Patient will perform tub/shower transfers w/assist (OT) Description: LTG: Patient will perform tub/shower transfers with assist, with/without cues using equipment (OT) Outcome: Not Applicable Flowsheets (Taken 08/09/2019 1234) LTG: Pt will perform tub/shower stall transfers with assistance level of: (dc goal 7/26) -- Note: Dc goal 7/26 due to lack of progress   Problem: RH Balance Goal: LTG: Patient will maintain dynamic sitting balance (OT) Description: LTG:  Patient will maintain dynamic sitting balance with assistance during activities of daily living (OT) Flowsheets (Taken 08/09/2019 1234) LTG: Pt will maintain dynamic sitting balance during ADLs with: Minimal Assistance - Patient > 75% Note: Goal downgraded 7/26 due to lack of progress-ESD Goal: LTG Patient will maintain dynamic standing with ADLs (OT) Description: LTG:  Patient will maintain dynamic standing balance with assist during activities of daily living (OT)  Flowsheets (Taken 08/09/2019 1234) LTG: Pt will maintain dynamic standing balance during ADLs with: Maximal Assistance - Patient 25 - 49% Note: Goal downgraded 7/26 due to lack of progress-ESD   Problem: Sit to Stand Goal: LTG:  Patient will perform sit to stand in prep for activites of daily living with assistance level (OT) Description: LTG:  Patient will perform sit to stand in prep for activites of daily living with assistance level (OT) Flowsheets (Taken 08/09/2019 1234) LTG: PT will perform sit to stand in prep for activites of daily  living with assistance level: Moderate Assistance - Patient 50 - 74% Note: Goal downgraded 7/26 due to lack of progress-ESD   Problem: RH Eating Goal: LTG Patient will perform eating w/assist, cues/equip (OT) Description: LTG: Patient will perform eating with assist, with/without cues using equipment (OT) Flowsheets (Taken 08/09/2019 1234) LTG: Pt will perform eating with assistance level of: Supervision/Verbal cueing   Problem: RH Bathing Goal: LTG Patient will bathe all body parts with assist levels (OT) Description: LTG: Patient will bathe all body parts with assist levels (OT) Flowsheets (Taken 08/09/2019 1234) LTG: Pt will perform bathing with assistance level/cueing: Moderate Assistance - Patient 50 - 74% Note: Goal downgraded 7/26 due to lack of progress-ESD   Problem: RH Dressing Goal: LTG Patient will perform upper body dressing (OT) Description: LTG Patient will perform upper body dressing with assist, with/without cues (OT). Flowsheets (Taken 08/09/2019 1234) LTG: Pt will perform upper body dressing with assistance level of: Moderate Assistance - Patient 50 - 74% Note: Goal downgraded 7/26 due to lack of progress-ESD Goal: LTG Patient will perform lower body dressing w/assist (OT) Description: LTG: Patient will perform lower body dressing with assist, with/without cues in positioning using equipment (OT) Flowsheets (Taken 08/09/2019 1234) LTG: Pt will perform lower body dressing with assistance level of: Maximal Assistance - Patient 25 - 49% Note: Goal downgraded 7/26 due to lack of progress-ESD   Problem: RH Toilet Transfers Goal: LTG Patient will perform toilet transfers w/assist (OT) Description: LTG: Patient will perform toilet transfers with assist, with/without cues using equipment (OT) Flowsheets (Taken 08/09/2019 1234) LTG: Pt will perform toilet transfers with assistance level of: Moderate Assistance - Patient 50 - 74% Note: Goal downgraded 7/26 due to lack of  progress-ESD

## 2019-08-09 NOTE — Progress Notes (Signed)
Occupational Therapy Session Note  Patient Details  Name: [name] Cruz MRN: [id number] Date of Birth: 29-May-1955  Today's Date: 08/09/2019 OT Individual Time: 3500-9381 OT Individual Time Calculation (min): 56 min   Skilled Therapeutic Interventions/Progress Updates:    Pt greeted in bed with NT present. Pt was incontinent of urine and bed was saturated. Max A for supine<sit. +2 for sit<stand in Stedy, manual facilitation for tucking in buttocks to allow for lowering of paddles. Pt required Max A of 1 to offset Lt lean/push while 2nd helper steered the [facility] into the bathroom where pt then transferred to the bariatric drop arm commode placed in shower. Pt bathed while seated, cutout used for perihygiene completion. Worked on praxis, sitting balance, Lt attention, and following 1 step verbal instruction at this time. Pt with Lt lateral lean throughout shower, manual cues for correction and neutral alignment when weight shifting forward. HOH to use the Lt hand to wash his Rt side. Vcs for acknowledging Lt side to wash. +2 for [facility] transfer out of the shower where pt then dressed w/c level sit<stand at the sink. Max A for sit<stand and Max A for static balance during LB dressing. He was able to thread his Rt LE into pants with Min balance assistance today. Hemi strategies utilized for donning overhead shirt with Mod A. Worked on carryover of joint protection education during oral care and handwashing completion sitting at the sink. Pt able to lift Lt arm on the sink with Mod A, holding affected limb by the wrist. Cued him several times during session to assist with safely moving his affected UE with min cuing to remember to lift by wrist vs digits. HOH to incorporate flaccid Lt hand throughout. Placed several ADL items on the Lt side of the sink to promote Lt scanning/attention. Pt requiring min cues for finding these items. At end of session pt remained sitting up in the w/c with all needs within reach,  safety belt fastened, and Lt arm elevated on half lap tray.    Therapy Documentation Precautions:  Precautions Precautions: Fall Precaution Comments: L hemi, L inattention, pusher Restrictions Weight Bearing Restrictions: No Pain: Pt reported some soreness in his Lt arm, declining interventions to address as it was reportedly manageable Pain Assessment Pain Scale: 0-10 Pain Score: 0-No pain ADL: ADL Eating: Minimal assistance Grooming: Maximal assistance Upper Body Bathing: Maximal assistance Lower Body Bathing: Maximal assistance Upper Body Dressing: Moderate assistance Lower Body Dressing: Maximal assistance Toileting: Unable to assess Toilet Transfer: Maximal assistance (+2 using Stedy)      Therapy/Group: Individual Therapy  Lucielle Vokes A Nelle Sayed 08/09/2019, 12:12 PM

## 2019-08-09 NOTE — NC FL2 (Addendum)
[place] MEDICAID FL2 LEVEL OF CARE SCREENING TOOL     IDENTIFICATION  Patient Name: [name] Cruz Birthdate: 10-12-1955 Sex: male Admission Date (Current Location): 07/26/2019  [facility] and [place] Number:  [employer] and Address:  [facility], [address] [place], [place] [address]      Provider Number: [id number]  Attending Physician Name and Address:  Ranelle Oyster, MD  Relative Name and Phone Number:  [name] (sister) [phone]    Current Level of Care: Hospital Recommended Level of Care: Nursing Facility Prior Approval Number:    Date Approved/Denied:   PASRR Number:    Discharge Plan: SNF    Current Diagnoses: Patient Active Problem List   Diagnosis Date Noted  . SAH (subarachnoid hemorrhage) (HCC)   . Neurogenic bladder   . Benign essential HTN   . Labile blood pressure   . Hemiparesis affecting left side as late effect of stroke (HCC)   . Thalamic hemorrhage (HCC) 07/26/2019  . ICH (intracerebral hemorrhage) (HCC) 07/19/2019  . History of DVT in adulthood 02/21/2019  . PAD (peripheral artery disease) (HCC) 12/14/2018  . Agitation 09/04/2018  . Acute CVA (cerebrovascular accident) (HCC) 08/07/2018  . Cerebellar stroke, acute (HCC) 07/10/2018  . Status epilepticus (HCC) 03/25/2018  . Unilateral occipital headache 10/03/2017  . Ischemic stroke (HCC) 08/23/2017  . Coronary artery disease involving coronary bypass graft of native heart without angina pectoris   . Atrial fibrillation (HCC)   . Dysphagia, post-stroke   . Cardiac arrest (HCC) 08/09/2017  . S/P CABG x 3 02/24/2017  . NSTEMI (non-ST elevated myocardial infarction) (HCC) 02/21/2017  . Encephalomalacia 10/28/2012  . Seizure disorder (HCC) 04/26/2012  . Alcohol abuse 04/26/2012  . History of stroke with current residual effects   . Cognitive impairment   . HLD (hyperlipidemia)   . HTN (hypertension)     Orientation RESPIRATION BLADDER  Height & Weight     Self  Normal Continent (urgency) Weight: 172 lb 9.9 oz (78.3 kg) Height:  5\' 3"  (160 cm)  BEHAVIORAL SYMPTOMS/MOOD NEUROLOGICAL BOWEL NUTRITION STATUS      Continent Diet (dysphagia #3 nectar thick)  AMBULATORY STATUS COMMUNICATION OF NEEDS Skin   Limited Assist Verbally Normal                       Personal Care Assistance Level of Assistance  Bathing, Dressing Bathing Assistance: Maximum assistance   Dressing Assistance: Maximum assistance     Functional Limitations Info  Speech     Speech Info: Impaired    SPECIAL CARE FACTORS FREQUENCY  PT (By licensed PT), OT (By licensed OT), Speech therapy     PT Frequency: 5xs per week OT Frequency: 5xs per week     Speech Therapy Frequency: 5xs per week      Contractures      Additional Factors Info  Code Status Code Status Info: Full             Current Medications (08/09/2019):  This is the current hospital active medication list Current Facility-Administered Medications  Medication Dose Route Frequency Provider Last Rate Last Admin  . acetaminophen (TYLENOL) tablet 650 mg  650 mg Oral Q4H PRN Angiulli, 08/11/2019, PA-C       Or  . acetaminophen (TYLENOL) 160 MG/5ML solution 650 mg  650 mg Per Tube Q4H PRN Angiulli, Mcarthur Rossetti, PA-C       Or  . acetaminophen (TYLENOL) suppository 650  mg  650 mg Rectal Q4H PRN Angiulli, Mcarthur Rossetti, PA-C      . atorvastatin (LIPITOR) tablet 80 mg  80 mg Oral Daily Ranelle Oyster, MD   80 mg at 08/09/19 0851  . divalproex (DEPAKOTE) DR tablet 500 mg  500 mg Oral BID Charlton Amor, PA-C   500 mg at 08/09/19 2536  . lacosamide (VIMPAT) tablet 200 mg  200 mg Oral 2 times per day Charlton Amor, PA-C   200 mg at 08/08/19 2316  . levETIRAcetam (KEPPRA) tablet 1,500 mg  1,500 mg Oral BID Charlton Amor, PA-C   1,500 mg at 08/09/19 0851  . lidocaine (XYLOCAINE) 2 % jelly   Topical PRN Love, Pamela S, PA-C      . multivitamin with minerals tablet 1 tablet  1  tablet Oral Daily Charlton Amor, PA-C   1 tablet at 08/09/19 0851  . pantoprazole (PROTONIX) EC tablet 40 mg  40 mg Oral QHS Charlton Amor, PA-C   40 mg at 08/08/19 2315  . Resource ThickenUp Clear   Oral PRN Angiulli, Mcarthur Rossetti, PA-C      . senna-docusate (Senokot-S) tablet 2 tablet  2 tablet Oral BID Jacquelynn Cree, PA-C   2 tablet at 08/09/19 0851  . sertraline (ZOLOFT) tablet 25 mg  25 mg Oral Daily Charlton Amor, PA-C   25 mg at 08/09/19 0850  . sorbitol 70 % solution 30 mL  30 mL Oral Daily PRN Charlton Amor, PA-C   30 mL at 07/27/19 2020  . tamsulosin (FLOMAX) capsule 0.4 mg  0.4 mg Oral QPC supper Jacquelynn Cree, PA-C   0.4 mg at 08/08/19 1801  . ziprasidone (GEODON) capsule 40 mg  40 mg Oral QHS AngiulliMcarthur Rossetti, PA-C   40 mg at 08/08/19 2317     Discharge Medications: Please see discharge summary for a list of discharge medications.  Relevant Imaging Results:  Relevant Lab Results:   Additional Information UY#:[id number]  Gretchen Short, LCSW

## 2019-08-10 ENCOUNTER — Inpatient Hospital Stay (HOSPITAL_COMMUNITY): Payer: Medicare Other | Admitting: Occupational Therapy

## 2019-08-10 ENCOUNTER — Inpatient Hospital Stay (HOSPITAL_COMMUNITY): Payer: Medicare Other | Admitting: Physical Therapy

## 2019-08-10 ENCOUNTER — Inpatient Hospital Stay (HOSPITAL_COMMUNITY): Payer: Medicare Other

## 2019-08-10 NOTE — Progress Notes (Signed)
Occupational Therapy Session Note  Patient Details  Name: [name] Cruz MRN: [id number] Date of Birth: 12-Dec-1955  Today's Date: 08/10/2019 OT Individual Time: 1540-0867 OT Individual Time Calculation (min): 43 min   Short Term Goals: Week 3:  OT Short Term Goal 1 (Week 3): Pt will demonstrate self ROM with min verbal cues OT Short Term Goal 2 (Week 3): Pt will complete toilet transfer with max A of 1 OT Short Term Goal 3 (Week 3): Pt will tolerate sitting unsupported for 10 minutes with no more than min A for static balance  Skilled Therapeutic Interventions/Progress Updates:    Pt greeted seated in recliner with nurse techs reporting pt had just gotten done using the bathroom. Clean brief was on, but pants were not up. Sit<>stand from wc with max A of 1, while +2 assisted with pulling up pants. Pt brought down to therapy gym and completed  Stand-pivot to therapy mat with max A of 1. Worked on L attention and sitting balance with card matching activity. OT placed cards under L hand so pt had to look L and locate cards prior to placing on board on R side of body. Improved sitting balance and righting reactions. Pt still needed manual facilitation to return to midline at times. L UE NMR with joint input provided through elbow and wrist to facilitate shoulder flext/ext and elbow flex/ext. Pt with some shoulder activation noted, but continues to be apraxic with poor motor planning. Pt pivoted back to wc at end of session in similar fashion. OT placed SAEBO e-stim on wrist extensors. SAEBO left on for 60 minutes. OT returned to remove SAEBO with skin intact and no adverse reactions.  [place] Stim One 330 pulse width 35 Hz pulse rate On 8 sec/ off 8 sec Ramp up/ down 2 sec Symmetrical Biphasic wave form  Max intensity 164m at 500 Ohm load  Pt left seated in wc with alarm belt on, call bell in reach, and needs met.   Therapy Documentation Precautions:  Precautions Precautions: Fall Precaution  Comments: L hemi, L inattention, pusher Restrictions Weight Bearing Restrictions: No Pain: Pt denies pain  Therapy/Group: Individual Therapy  [name]7/27/2021, 11:47 AM

## 2019-08-10 NOTE — Plan of Care (Signed)
Goals downgraded & discharged 2/2 slow progress & not appropriate at this time.  Problem: RH Balance Goal: LTG Patient will maintain dynamic standing balance (PT) Description: LTG:  Patient will maintain dynamic standing balance with assistance during mobility activities (PT) Outcome: Adequate for Discharge Flowsheets (Taken 08/10/2019 0916) LTG: Pt will maintain dynamic standing balance during mobility activities with:: (d/c goal - not appropriate at this time) -- Note: d/c goal - not appropriate at this time   Problem: RH Car Transfers Goal: LTG Patient will perform car transfers with assist (PT) Description: LTG: Patient will perform car transfers with assistance (PT). Outcome: Adequate for Discharge Flowsheets (Taken 08/10/2019 0916) LTG: Pt will perform car transfers with assist:: (d/c goal - not appropriate at this time) -- Note: d/c goal - not appropriate at this time   Problem: RH Ambulation Goal: LTG Patient will ambulate in controlled environment (PT) Description: LTG: Patient will ambulate in a controlled environment, # of feet with assistance (PT). Outcome: Adequate for Discharge Flowsheets (Taken 08/10/2019 0916) LTG: Pt will ambulate in controlled environ  assist needed:: (d/c goal - not appropriate at this time) -- Note: d/c goal - not appropriate at this time Goal: LTG Patient will ambulate in home environment (PT) Description: LTG: Patient will ambulate in home environment, # of feet with assistance (PT). Outcome: Adequate for Discharge Flowsheets (Taken 08/10/2019 0916) LTG: Pt will ambulate in home environ  assist needed:: (d/c goal - not appropriate at this time) -- Note: d/c goal - not appropriate at this time   Problem: RH Stairs Goal: LTG Patient will ambulate up and down stairs w/assist (PT) Description: LTG: Patient will ambulate up and down # of stairs with assistance (PT) Outcome: Not Applicable Flowsheets (Taken 08/10/2019 0916) LTG: Pt will ambulate  up/down stairs assist needed:: (d/c goal - not appropriate at this time) -- Note: d/c goal - not appropriate at this time   Problem: RH Balance Goal: LTG Patient will maintain dynamic sitting balance (PT) Description: LTG:  Patient will maintain dynamic sitting balance with assistance during mobility activities (PT) Flowsheets (Taken 08/10/2019 0916) LTG: Pt will maintain dynamic sitting balance during mobility activities with:: (downgraded 2/2 slow progress) Minimal Assistance - Patient > 75%   Problem: Sit to Stand Goal: LTG:  Patient will perform sit to stand with assistance level (PT) Description: LTG:  Patient will perform sit to stand with assistance level (PT) Flowsheets (Taken 08/10/2019 0916) LTG: PT will perform sit to stand in preparation for functional mobility with assistance level: (downgrade 2/2 slow progress) Moderate Assistance - Patient 50 - 74% Note: Downgrade 2/2 slow progress   Problem: RH Bed Mobility Goal: LTG Patient will perform bed mobility with assist (PT) Description: LTG: Patient will perform bed mobility with assistance, with/without cues (PT). Flowsheets (Taken 08/10/2019 0916) LTG: Pt will perform bed mobility with assistance level of: (downgrade 2/2 slow progress) Moderate Assistance - Patient 50 - 74% Note: downgrade 2/2 slow progress   Problem: RH Bed to Chair Transfers Goal: LTG Patient will perform bed/chair transfers w/assist (PT) Description: LTG: Patient will perform bed to chair transfers with assistance (PT). Flowsheets (Taken 08/10/2019 0916) LTG: Pt will perform Bed to Chair Transfers with assistance level: (downgrade 2/2 slow progress) Moderate Assistance - Patient 50 - 74% Note: downgrade 2/2 slow progress   Problem: RH Memory Goal: LTG Patient will demonstrate ability for day to day recall/carry over during activities of daily living with assistance level (PT) Description: LTG:  Patient will demonstrate ability for day  to day recall/carry  over during activities of daily living with assistance level (PT). Flowsheets (Taken 08/10/2019 0916) LTG:  Patient will demonstrate ability for day to day recall/carry over during activities of daily living with assistance level (PT): (downgrade 2/2 slow progress) Maximal Assistance - Patient 25 - 49% Note: downgrade 2/2 slow progress

## 2019-08-10 NOTE — Progress Notes (Signed)
Physical Therapy Weekly Progress Note  Patient Details  Name: [name] Cruz MRN: [id number] Date of Birth: May 31, 1955  Beginning of progress report period: August 03, 2019 End of progress report period: August 10, 2019  Today's Date: 08/10/2019 PT Individual Time: 0165-5374 PT Individual Time Calculation (min): 55 min   Patient has met 0 of 4 short term goals.  Pt is making slow progress towards LTG's. Pt currently requires max assist for all mobility with additional +2 for safety when gait training. Pt continues to demonstrate impaired motor planning & initiation/termintation of activities, impaired cognition & awareness, impaired midline orientation as pt with strong push to L, and impaired neuromuscular control of LUE/LLE. Pt would benefit from continued skilled PT treatment to address the deficits noted above & below to increase independence & safety with functional mobility.  Patient continues to demonstrate the following deficits muscle weakness, decreased cardiorespiratoy endurance, motor apraxia, decreased coordination and decreased motor planning, decreased visual perceptual skills, decreased attention to left, decreased initiation, decreased attention, decreased awareness, decreased problem solving, decreased safety awareness, decreased memory and delayed processing, and decreased sitting balance, decreased standing balance, decreased postural control, decreased balance strategies and L hemiparesis and therefore will continue to benefit from skilled PT intervention to increase functional independence with mobility.  Patient is making very slow progress towards LTGS.  Plan of care revisions: goals downgraded & discharged 2/2 slow progress & not appropriate at this time (ambulation & stairs goal discharged, transfers downgraded to mod assist).  PT Short Term Goals Week 2:  PT Short Term Goal 1 (Week 2): Pt will consistently complete bed<>w/c transfers with LRAD & mod assist +1. PT Short Term  Goal 1 - Progress (Week 2): Not met PT Short Term Goal 2 (Week 2): Pt will ambulate 20 ft with LRAD & mod assist +1. PT Short Term Goal 2 - Progress (Week 2): Not met PT Short Term Goal 3 (Week 2): Pt will consistently complete bed mobility with mod assist +1. PT Short Term Goal 3 - Progress (Week 2): Not met PT Short Term Goal 4 (Week 2): Pt will propel w/c 40 ft with supervision. PT Short Term Goal 4 - Progress (Week 2): Not met Week 3:  PT Short Term Goal 1 (Week 3): Pt will propel w/c 50 ft consistently with min assist. PT Short Term Goal 2 (Week 3): Pt will demonstrate dynamic sitting balance x 3 minutes with no more than mod assist.  Skilled Therapeutic Interventions/Progress Updates:  Pt received in w/c & agreeable to tx. Transport to/from gym via w/c dependent assist. Sit<>stand with Max assist with cuing for increased anterior weight shift but pt continues to try to push posteriorly during transitional movement with pt requiring max assist for transfer & pt pulling up on rail in hallway. PT donned lite gait harness dependent assist. Standing in lite gait with focus on weight shifting to R to reach cups with PT providing max manual facilitation for increased weight shift R with max cuing but poor demo of knee flexion as pt keeps it extended & strongly pushing to L. Pt also requires hand over hand assist to engage in task as pt appears to become internally distracted with very poor sustained attention to task. Progressed to gait with lite gait (10-15 ft x 2 trials) with use of machine to facilitate forward hip on L & PT providing max assist for weight shifting R to allow increased ease of L foot advancement with PT also providing increased step width LLE.  Pt frequently hangs in lite gait sling vs activating glutes/hip extensors to come to full upright standing. Provided pt with thickened water & pt requires cuing for small sips. At end of session pt left in w/c with chair alarm donned & call bell  in reach.  Therapy Documentation Precautions:  Precautions Precautions: Fall Precaution Comments: L hemi, L inattention, pusher Restrictions Weight Bearing Restrictions: No  Pain: No behaviors demonstrating, or c/o of pain reported.    Therapy/Group: Individual Therapy  [name] Cruz 08/10/2019, 10:40 AM

## 2019-08-10 NOTE — Patient Care Conference (Signed)
Inpatient RehabilitationTeam Conference and Plan of Care Update Date: 08/10/2019   Time: 10:49 AM    Patient Name: [name] Cruz      Medical Record Number: [id number]  Date of Birth: Dec 25, 1955 Sex: Male         Room/Bed: 4M09C/4M09C-01 Payor Info: Payor: Multimedia programmer / Plan: [facility] MEDICARE / Product Type: *No Product type* /    Admit Date/Time:  07/26/2019  5:03 PM  Primary Diagnosis:  Thalamic hemorrhage [facility])  Hospital Problems: Principal Problem:   Thalamic hemorrhage (HCC) Active Problems:   SAH (subarachnoid hemorrhage) (HCC)   Neurogenic bladder   Benign essential HTN   Labile blood pressure   Hemiparesis affecting left side as late effect of stroke [facility])    Expected Discharge Date: Expected Discharge Date:  (SNF Placement)  Team Members Present: Physician leading conference: Dr. Sula Soda Care Coodinator Present: Cecile Sheerer, LCSWA;Cristan Hout Marlyne Beards, RN, BSN, CRRN Nurse Present: Regino Schultze, RN PT Present: Aleda Grana, PT OT Present: Kearney Hard, OT SLP Present: Colin Benton, SLP PPS Coordinator present : Fae Pippin, SLP     Current Status/Progress Goal Weekly Team Focus  Bowel/Bladder   pt had cont. episode of bladder, can be incontient, lbm- 08/09/19, q8 bladder scan no void, Pt sometimes has trouble voiding and needs in and out cathing  less incontnient episodes   Assess toiletting q shift and prn, time toilette while awake    Swallow/Nutrition/ Hydration   dys 3 and nectar thick, water protocol min A  Mod I  water protocol and carryover of swallow startegies   ADL's   mod/max A of 1 for transfers, still needs +2 for some standing BADLs  Goals downgraded mod A overall  L UE NMR, NMES, transfers, sitting balance, activity tolernace, trunk control, L attention self-care retraining   Mobility   max assist transfers, max assist +1-2 for gait, min<>Max assist w/c mobility for steering, impaired awareness & cognition  min assist  transfers & short distance gait with LRAD, supervision w/c mobility  L NMR, midline orientation, balance, transfers, gait, awareness/cognition, pt/family education, d/c planning   Communication   Supervision  Mod I  use of speech intelligibility strategies   Safety/Cognition/ Behavioral Observations  Max-Mod A  Supervision  attention to left, basic problem solving, and recall   Pain   pt has no current complaints of pain   remain pain free   Assess pain q shift and prn    Skin   no current skin break down   remain free of infection and skin break down   Assess skin q shift and prn     Team Discussion:  Discharge Planning/Teaching Needs:  Pt now SNF placement. Placement pending bed offer.  Family education as recommended by therapy   Current Update:  None  Current Barriers to Discharge:  Inaccessible home environment and Lack of/limited family support  Possible Resolutions to Barriers: SNF placement  Patient on target to meet rehab goals: no, continent with incontinent episodes. PVR volumes low. Mod/max assist with minimal improvement. Remains heavy Left pusher, Max of 1-2 assist. SLP to add updates.  *See Care Plan and progress notes for long and short-term goals.   Revisions to Treatment Plan:  None    Medical Summary Current Status: Last cathed 24th, diastolic BP elevated, complaining of right foot pain, nectar thick liquids Weekly Focus/Goal: discontinue bladder scans, has Tylenol and Tramadol- premedicated prior to therapy, advance diet as tolerated  Barriers to Discharge: Insurance for SNF coverage;Behavior  Barriers to Discharge Comments: Impaired cognition Possible Resolutions to Barriers: Pending insurance authorization for SNF   Continued Need for Acute Rehabilitation Level of Care: The patient requires daily medical management by a physician with specialized training in physical medicine and rehabilitation for the following reasons: Direction of a  multidisciplinary physical rehabilitation program to maximize functional independence : Yes Medical management of patient stability for increased activity during participation in an intensive rehabilitation regime.: Yes Analysis of laboratory values and/or radiology reports with any subsequent need for medication adjustment and/or medical intervention. : Yes   I attest that I was present, lead the team conference, and concur with the assessment and plan of the team.   Tennis Must 08/10/2019, 2:53 PM

## 2019-08-10 NOTE — Progress Notes (Signed)
Physical Therapy Session Note  Patient Details  Name: MARCOS RUELAS MRN: [id number] Date of Birth: 1955/04/14  Today's Date: 08/10/2019 PT Individual Time: 0802-0830 PT Individual Time Calculation (min): 28 min   Short Term Goals: Week 2:  PT Short Term Goal 1 (Week 2): Pt will consistently complete bed<>w/c transfers with LRAD & mod assist +1. PT Short Term Goal 1 - Progress (Week 2): Not met PT Short Term Goal 2 (Week 2): Pt will ambulate 20 ft with LRAD & mod assist +1. PT Short Term Goal 2 - Progress (Week 2): Not met PT Short Term Goal 3 (Week 2): Pt will consistently complete bed mobility with mod assist +1. PT Short Term Goal 3 - Progress (Week 2): Not met PT Short Term Goal 4 (Week 2): Pt will propel w/c 40 ft with supervision. PT Short Term Goal 4 - Progress (Week 2): Not met   PT treatment:   Pt  received supine in bed and agreeable to PT. Supine>sit transfer with mod assist and cues for sequencing to roll onto L side then use rail to push in to sitting. Once in sitting mod assist initially required to maintain balance, until pt able to bear weight through R elbow on 2 pillows to prevent pushers response. Noted to ne incontinent ob bladder. Sit<>stand at EOB with mod assist and mod-max assist to maintain balance with UE support on RW while PT performed clothing management to doff/don brief and pants. Upper body dressing with max assist for time management. Stand pivot transfer to [facility] with LUE over PT and mod assist overall.   Pt transported to rehab gym. Gait training with LUE over therapist and no UE support on the R. 44f +274fwith max assist and multimodal cues to improve sequencing, midline orientation, step width, and maintain erect trunk posture with facilitation at the L glutes.   [facility] and all needs met.        Therapy Documentation Precautions:  Precautions Precautions: Fall Precaution Comments: L  hemi, L inattention, pusher Restrictions Weight Bearing Restrictions: No Vital Signs: Therapy Vitals Temp: 97.8 F (36.6 C) Temp Source: Oral Pulse Rate: 82 Resp: 16 BP: (!) 123/89 Patient Position (if appropriate): Lying Oxygen Therapy SpO2: 98 % O2 Device: Room Air Pain: denies   Therapy/Group: Individual Therapy  AuLorie Phenix/27/2021, 8:59 AM

## 2019-08-10 NOTE — Progress Notes (Signed)
[place] PHYSICAL MEDICINE & REHABILITATION PROGRESS NOTE   Subjective/Complaints: Plan for DC home once bed is available Vitals stable except for elevated diastolic BP   ROS:  Pt denies SOB, abd pain, CP, N/V/C/D, and vision changes   Objective:   No results found. No results for input(s): WBC, HGB, HCT, PLT in the last 72 hours. No results for input(s): NA, K, CL, CO2, GLUCOSE, BUN, CREATININE, CALCIUM in the last 72 hours.  Intake/Output Summary (Last 24 hours) at 08/10/2019 0948 Last data filed at 08/10/2019 0205 Gross per 24 hour  Intake 660 ml  Output 400 ml  Net 260 ml     Physical Exam: Vital Signs Blood pressure (!) 123/89, pulse 82, temperature 97.8 F (36.6 C), temperature source Oral, resp. rate 16, height 5\' 3"  (1.6 m), weight 78.3 kg, SpO2 98 %. General: Alert and oriented x 3, No apparent distress HEENT: Head is normocephalic, atraumatic, PERRLA, EOMI, sclera anicteric, oral mucosa pink and moist, dentition intact, ext ear canals clear,  Neck: Supple without JVD or lymphadenopathy Heart: Reg rate and rhythm. No murmurs rubs or gallops Chest: CTA bilaterally without wheezes, rales, or rhonchi; no distress Abdomen: Soft, non-tender, non-distended, bowel sounds positive. Extremities: No clubbing, cyanosis, or edema. Pulses are 2+ Skin: Warm and dry.  Intact. Psych: vague, talkative Musc: No edema in extremities.  No tenderness in extremities. Musc: No edema in extremities.  No tenderness in extremities. Neuro: Alert and oriented x1- a lot of word substitutions Motor: LUE 0/5 proximal to distal, unchanged LLE: HF, KE 3/5, ADF 1+/5, some improvement.   Assessment/Plan: 1. Functional deficits secondary to right thalamocapsular hemorrhage which require 3+ hours per day of interdisciplinary therapy in a comprehensive inpatient rehab setting.  Physiatrist is providing close team supervision and 24 hour management of active medical problems listed  below.  Physiatrist and rehab team continue to assess barriers to discharge/monitor patient progress toward functional and medical goals  Care Tool:  Bathing    Body parts bathed by patient: Chest, Abdomen, Front perineal area, Right upper leg, Left upper leg, Face   Body parts bathed by helper: Buttocks, Right lower leg, Left lower leg, Right arm, Left arm     Bathing assist Assist Level: Maximal Assistance - Patient 24 - 49%     Upper Body Dressing/Undressing Upper body dressing Upper body dressing/undressing activity did not occur (including orthotics): Refused (asked. pt did not want to change shirt. ) What is the patient wearing?: Pull over shirt    Upper body assist Assist Level: Moderate Assistance - Patient 50 - 74%    Lower Body Dressing/Undressing Lower body dressing      What is the patient wearing?: Pants, Incontinence brief     Lower body assist Assist for lower body dressing: Maximal Assistance - Patient 25 - 49%     Toileting Toileting Toileting Activity did not occur (Clothing management and hygiene only): N/A (no void or bm)  Toileting assist Assist for toileting: Set up assist (Urinal)     Transfers Chair/bed transfer  Transfers assist  Chair/bed transfer activity did not occur: Safety/medical concerns  Chair/bed transfer assist level: Maximal Assistance - Patient 25 - 49%     Locomotion Ambulation   Ambulation assist   Ambulation activity did not occur: Safety/medical concerns  Assist level: 2 helpers Assistive device: Walker-rolling Max distance: 25 ft   Walk 10 feet activity   Assist  Walk 10 feet activity did not occur: Safety/medical concerns  Assist level: 2  helpers Assistive device: Walker-rolling   Walk 50 feet activity   Assist Walk 50 feet with 2 turns activity did not occur: Safety/medical concerns  Assist level: 2 helpers Assistive device: Hand held assist    Walk 150 feet activity   Assist Walk 150 feet  activity did not occur: Safety/medical concerns         Walk 10 feet on uneven surface  activity   Assist Walk 10 feet on uneven surfaces activity did not occur: Safety/medical concerns         Wheelchair     Assist Will patient use wheelchair at discharge?: Yes Type of Wheelchair: Manual Wheelchair activity did not occur: Safety/medical concerns  Wheelchair assist level: Minimal Assistance - Patient > 75% Max wheelchair distance: 150    Wheelchair 50 feet with 2 turns activity    Assist    Wheelchair 50 feet with 2 turns activity did not occur: Safety/medical concerns   Assist Level: Minimal Assistance - Patient > 75%   Wheelchair 150 feet activity     Assist  Wheelchair 150 feet activity did not occur: Safety/medical concerns   Assist Level: Minimal Assistance - Patient > 75%   Blood pressure (!) 123/89, pulse 82, temperature 97.8 F (36.6 C), temperature source Oral, resp. rate 16, height 5\' 3"  (1.6 m), weight 78.3 kg, SpO2 98 %.  Medical Problem List and Plan: 1.Left side hemiparesis/dysarthriasecondary to rightthalamocapsularhemorrhage most likely related to hypertensive crisis as well as old left temporal parietal infarction with small vessel disease  Continue CIR  -WHO LUE, PRAFO nightly  Team conference 7/27 2. Antithrombotics: -DVT/anticoagulation:Continue SCDs -antiplatelet therapy: N/A 3. Pain Management:Tylenol as needed  7/26- Will try Tramadol 50 mg BID prn if tylenol not enough- however, can't because has hx of seizures- will give norco prn 4. Mood:Zoloft 25 mg daily -antipsychotic agents: Geodon 40 mg nightly -mood seems for the most part well controlled 5. Neuropsych: This patientis notcapable of making decisions on hisown behalf. 6. Skin/Wound Care:Continue local care to hand, elevate LUE while in bed/chair 7. Fluids/Electrolytes/Nutrition:.  -protein supp 8. Post stroke  dysphagia. Dysphagia #3 nectar thick liquids. Advance perspeech therapy  9. History of seizure disorder. Vimpat 200 mg twice daily, Keppra 1500 mg twice daily, Depakote 500 mg twice daily.  10. Hypertension. Coreg 3.125 mg twice daily.    7/27: diastolic slightly elevated- continue meds 11. PAF.  -No anticoagulationdue to history of left MCA infarction with hemorrhagic conversion 12. Hyperlipidemia. Resumed Lipitor   13. Obesity. BMI 30. Dietary follow-up 14. CAD with CABG 2019. No chest pain or shortness of breath. 15.Mild thrombocytopenia: Resolved 16. Neurogenic bladder:   - I/O cath prn, monitor PVR's, increased checks to every 6 horus  -OOB to void  -pt completed 5 days of macrodantin for 80k staph epi  Continue same, repeat UA negative, urine culture remains pending  7/26- No growth on Cx  LOS: 15 days A FACE TO FACE EVALUATION WAS PERFORMED  8/26 P Chestina Komatsu 08/10/2019, 9:48 AM

## 2019-08-10 NOTE — Progress Notes (Signed)
Speech Language Pathology Daily Session Note  Patient Details  Name: [name] Cruz MRN: [id number] Date of Birth: 02/28/1955  Today's Date: 08/10/2019 SLP Individual Time: 1400-1500 SLP Individual Time Calculation (min): 60 min  Short Term Goals: Week 2: SLP Short Term Goal 1 (Week 2): Patient will consume trials of thin liquids with only supervision verbal cues needed to minimize oral holding or "swishing" of liquids prior to the swallow without overt s/s of aspiration over 2 sessions prior to repeat MBS. SLP Short Term Goal 2 (Week 2): Patient will utilize external aids for recall of time and place with Mod A verbal and visual cues. SLP Short Term Goal 3 (Week 2): Patient will follow 1-step commands during functional tasks with Mod A verbal and visual cues. SLP Short Term Goal 4 (Week 2): Patient will demonstrate basic problem solving for functional and familiar tasks with Mod A verbal and visual cues. SLP Short Term Goal 5 (Week 2): Patient will participate in structured language tasks and improve word-finding/naming to 75% accuracy and Mod A multimodal cues. SLP Short Term Goal 6 (Week 2): Patient will verbalize wants/needs at the sentence level with Min A multimodal cues for word-finidng.  Skilled Therapeutic Interventions: Skilled SLP tx focused on dysphagia and language. Oral care completed prior to trials with ice chips and thin liquid. Pt perseverative when cleaning mouth with oral toothbrush suction due to decreased attention. Trials completed with ice chips x6 and thin liquids by tsp and cup sips. Occasional swallow delay noted, however swallow appeared timely in majority of trials. No overt s/s of aspiration noted except for x1 slight wet vocal quality following large uncontrolled cup sip. SLP recommends repeat MBS to further assess swallow function due to nature of silent aspiration. Confrontational naming task with tangible everyday objects completed with 30% accuracy. Minimal  improvement noted with phonemic cues. Object identification task in field of 2 completed with 90% accuracy and minimal to no verbal cueing required.  Pt followed simple one step directions with 50% accuracy moderate verbal cues and increased time needed during task. Pt was left with call bell within reach and chair alarm on. SLP recommends continued services.      Pain Pain Assessment Faces Pain Scale: No hurt  Therapy/Group: Individual Therapy  Aram Beecham  08/10/2019, 3:43 PM

## 2019-08-10 NOTE — Plan of Care (Deleted)
Behavioral Plan   [place] Level: n/a  Behavior to decrease/ eliminate:  Poor memory Poor safety awareness Some impulsivity at times Forgetful about battery pack  Changes to environment:  Floor mats, Telesitter  Interventions: Telesitter, Safety ALARM  belt- good visual and physical reminder not to get up. Pt can sit at nurses station if he becomes restless Fidget toys and stress balls for restlessness   Recommendations for interactions with patient: Make sure Life Vest battery is around neck/arm Write in memory notebook after sessions  Attendees:  Alexei Doswell, OT Victoria Osterberg, PT Chelsea Evans, RN      

## 2019-08-11 ENCOUNTER — Inpatient Hospital Stay (HOSPITAL_COMMUNITY): Payer: Medicare Other

## 2019-08-11 ENCOUNTER — Inpatient Hospital Stay (HOSPITAL_COMMUNITY): Payer: Medicare Other | Admitting: Physical Therapy

## 2019-08-11 NOTE — Progress Notes (Signed)
Speech Language Pathology Weekly Progress and Session Note  Patient Details  Name: [name] Cruz MRN: [id number] Date of Birth: 02/20/1955  Beginning of progress report period: August 05, 2019 End of progress report period: August 11, 2019  Today's Date: 08/11/2019 SLP Individual Time: 4098-1191 SLP Individual Time Calculation (min): 40 min  Short Term Goals: Week 2: SLP Short Term Goal 1 (Week 2): Patient will consume trials of thin liquids with only supervision verbal cues needed to minimize oral holding or "swishing" of liquids prior to the swallow without overt s/s of aspiration over 2 sessions prior to repeat MBS. SLP Short Term Goal 1 - Progress (Week 2): Met SLP Short Term Goal 2 (Week 2): Patient will utilize external aids for recall of time and place with Mod A verbal and visual cues. SLP Short Term Goal 2 - Progress (Week 2): Not met SLP Short Term Goal 3 (Week 2): Patient will follow 1-step commands during functional tasks with Mod A verbal and visual cues. SLP Short Term Goal 3 - Progress (Week 2): Met SLP Short Term Goal 4 (Week 2): Patient will demonstrate basic problem solving for functional and familiar tasks with Mod A verbal and visual cues. SLP Short Term Goal 4 - Progress (Week 2): Not met SLP Short Term Goal 5 (Week 2): Patient will participate in structured language tasks and improve word-finding/naming to 75% accuracy and Mod A multimodal cues. SLP Short Term Goal 5 - Progress (Week 2): Met SLP Short Term Goal 6 (Week 2): Patient will verbalize wants/needs at the sentence level with Min A multimodal cues for word-finidng. SLP Short Term Goal 6 - Progress (Week 2): Not met    New Short Term Goals: Week 3: SLP Short Term Goal 1 (Week 3): Pt will participate in instrumental swallow assessment to determine least restrictive diet. SLP Short Term Goal 2 (Week 3): Patient will utilize external aids for recall of time and place with Mod A verbal and visual cues. SLP Short  Term Goal 3 (Week 3): Patient will follow 1-step commands with 75% accuracy during functional tasks with Min A verbal and visual cues. SLP Short Term Goal 4 (Week 3): Patient will demonstrate basic problem solving for functional and familiar tasks with Mod A verbal and visual cues. SLP Short Term Goal 5 (Week 3): Patient will participate in structured language tasks and improve word-finding/naming to 75% accuracy and Min A multimodal cues. SLP Short Term Goal 6 (Week 3): Patient will verbalize wants/needs at the sentence level with Min A multimodal cues for word-finidng.  Weekly Progress Updates:Pt made inconsistent progress meeting 3 out 6 goals. Pt's is able to effectively express wants/needs at phrase and word level but requires increase cues at the sentence level due to aphasic errors, with little awareness of verbal errors. Pt demonstrated improvement in object naming, basic problem solving, and following 1 step commands, however inconsistent, suggest possibly impacted by periods of reduced alertness. Pt has demonstrated improved ability to follow commands during thin water trials, initiating a more timely swallow and is scheduled for MBS on 08/12/2019. Pt continues to benefit from skills ST services in order to maximize functional independence and reduce burden of care, requiring 24 hour supervision and continued ST services.     Intensity: Minumum of 1-2 x/day, 30 to 90 minutes Frequency: 3 to 5 out of 7 days Duration/Length of Stay: 08/24/19 Treatment/Interventions: Cognitive remediation/compensation;Dysphagia/aspiration precaution training;Internal/external aids;Speech/Language facilitation;Cueing hierarchy;Environmental controls;Therapeutic Activities;Functional tasks;Patient/family education   Daily Session  Skilled Therapeutic Interventions:  Skilled ST services focused on cognitive skills. Pt was lethargic, pt denied reduced alertness and requiring max A fade to min A verbal cues to keep  eyes open during today's session. SLP set up pt with breakfast tray and pt consumed current diet dys 3 and nectar thick liquids via cup with no overt s/s aspiration and supervision A verbal cues for problem solving navigating tray. SLP facilitated basic problem solving and recall skills in card sorting task in field of 4, pt demonstrate Mod I in very basic task. Pt ws able to name colors on cards in 2 out 6 opportunities and in 4 out 6 opportunties with mod A binary choice cues. Pt was left in room with call bell within reach and bed alarm set. ST recommends to continue skilled ST services.     General    Pain Pain Assessment Pain Scale: 0-10 Pain Score: 0-No pain  Therapy/Group: Individual Therapy  Zein Helbing  [facility] 08/11/2019, 8:35 AM

## 2019-08-11 NOTE — Progress Notes (Signed)
Physical Therapy Session Note  Patient Details  Name: [name] Cruz MRN: [id number] Date of Birth: [birth year]  Today's Date: 08/11/2019 PT Individual Time: 1455-1538 PT Individual Time Calculation (min): 43 min   Short Term Goals: Week 3:  PT Short Term Goal 1 (Week 3): Pt will propel w/c 50 ft consistently with min assist. PT Short Term Goal 2 (Week 3): Pt will demonstrate dynamic sitting balance x 3 minutes with no more than mod assist.  Skilled Therapeutic Interventions/Progress Updates:     Patient in w/c in the room upon PT arrival. Patient alert and agreeable to PT session. Patient denied pain during session. Patient with flat affect and perseverated on "I understand" as response to therapist's questions throughout session.   Therapeutic Activity: Bed Mobility: Patient performed sit to supine with max A of 1 person. Provided verbal cues for leaning to L elbow then lowering to side-lying for improved trunk control then rolling to supine. Transfers: Patient performed sit to/from stand x1 using a R rail with mod A and x2 without an AD with max A with increased posterior lean without UE support. On second stand attempted to initiation stand pivot transfer, but patient unable to follow cues for sequencing due to decreased motor planning and returned to sitting. He then performed a squat pivot with max A w/c>bed. Provided verbal cues for foot placement, forward weight shift.  Gait Training:  Patient ambulated 31 feet using R rail with mod-max A and max A for L foot placement due to adductor tone. Ambulated with step-to gait pattern leading with L, extensor thrust on L in stance, L hip flexion and posterior rotation, and posterior bias throughout. Provided verbal cues for sequencing and tactile cues and facilitation to reduce extensor thrust on L.  Wheelchair Mobility:  Patient was transported in the w/c with total A throughout session for energy conservation and time management.  Patient  in bed at end of session with breaks locked, bed alarm set, and all needs within reach.    Therapy Documentation Precautions:  Precautions Precautions: Fall Precaution Comments: L hemi, L inattention, pusher Restrictions Weight Bearing Restrictions: No    Therapy/Group: Individual Therapy  Michalina Calbert L Khang Hannum PT, DPT  08/11/2019, 4:46 PM

## 2019-08-11 NOTE — Progress Notes (Signed)
Modified Barium Swallow Progress Note  Patient Details  Name: [name] Cruz MRN: [id number] Date of Birth: Jan 27, 1955  Today's Date: 08/12/2019  Modified Barium Swallow completed.  Full report located under Chart Review in the Imaging Section.  Brief recommendations include the following:  Clinical Impression  Pt demonstrates markedly improved oropharyngeal swallow function today in comparison to last MBSS conducted 07/21/19. Swallow initiation is more timely, occurring more consistently at the level of the vallecula and only intermittently in the pyriforms, but no pooling was noted. He also demonstrates improved oral control of boluses regardless of size, as premature spillage only noted X1 and no airway intrusion occurred during that instance. A trace amount of thin barium penetrated into the laryngeal vestibule X2, however it remained above the level of the vocal folds and was completely ejected during the swallow, or with a sensed volitional dry swallow (PAS score 2). Although piecemeal swallowing noted when pt consumed barium pill with thin via straw, no penetration or aspiration observed. Recommend pt continue dysphagia 3 solid textures, upgrade to thin liquids using small sips, slow rate, straw are okay, and intermittently cue pt for dry swallows during liquid intake. Medications may be administered 1 at a time with thin or in purees. Please provide full supervision during meals to ensure safest intake and adherence to swallow precautions.   Swallow Evaluation Recommendations       SLP Diet Recommendations: Dysphagia 3 (Mech soft) solids;Thin liquid   Liquid Administration via: Straw;Cup   Medication Administration: Whole meds with liquid (or whole with puree; 1 at time)   Supervision: Patient able to self feed;Staff to assist with self feeding;Full supervision/cueing for compensatory strategies   Compensations: Slow rate;Small sips/bites;Monitor for anterior loss;Minimize  environmental distractions (intermittent dry swallow)   Postural Changes: Seated upright at 90 degrees   Oral Care Recommendations: Oral care BID        Little Ishikawa 08/12/2019,9:48 AM

## 2019-08-11 NOTE — Progress Notes (Signed)
[facility] PHYSICAL MEDICINE & REHABILITATION PROGRESS NOTE   Subjective/Complaints: [name] Cruz is working on a card sorting task with SLP this morning. He had decreased attention upon waking with lethargy but now appears to be much improved.   ROS:  Pt denies SOB, abd pain, CP, N/V/C/D, and vision changes  Objective:   No results found. No results for input(s): WBC, HGB, HCT, PLT in the last 72 hours. No results for input(s): NA, K, CL, CO2, GLUCOSE, BUN, CREATININE, CALCIUM in the last 72 hours.  Intake/Output Summary (Last 24 hours) at 08/11/2019 1055 Last data filed at 08/11/2019 0348 Gross per 24 hour  Intake 480 ml  Output 650 ml  Net -170 ml     Physical Exam: Vital Signs Blood pressure 111/79, pulse 82, temperature 97.8 F (36.6 C), temperature source Oral, resp. rate 16, height 5\' 3"  (1.6 m), weight 78.3 kg, SpO2 98 %. General: Alert and oriented x 3, No apparent distress HEENT: Head is normocephalic, atraumatic, PERRLA, EOMI, sclera anicteric, oral mucosa pink and moist, dentition intact, ext ear canals clear,  Neck: Supple without JVD or lymphadenopathy Heart: Reg rate and rhythm. No murmurs rubs or gallops Chest: CTA bilaterally without wheezes, rales, or rhonchi; no distress Abdomen: Soft, non-tender, non-distended, bowel sounds positive. Extremities: No clubbing, cyanosis, or edema. Pulses are 2+ Skin: Warm and dry.  Intact. Psych: vague, talkative Musc: No edema in extremities.  No tenderness in extremities. Musc: No edema in extremities.  No tenderness in extremities. Neuro: Alert and oriented x1- a lot of word substitutions Motor: LUE 0/5 proximal to distal, unchanged LLE: HF, KE 3/5, ADF 1+/5, some improvement.  Assessment/Plan: 1. Functional deficits secondary to right thalamocapsular hemorrhage which require 3+ hours per day of interdisciplinary therapy in a comprehensive inpatient rehab setting.  Physiatrist is providing close team supervision and 24  hour management of active medical problems listed below.  Physiatrist and rehab team continue to assess barriers to discharge/monitor patient progress toward functional and medical goals  Care Tool:  Bathing    Body parts bathed by patient: Chest, Abdomen, Front perineal area, Right upper leg, Left upper leg, Face   Body parts bathed by helper: Buttocks, Right lower leg, Left lower leg, Right arm, Left arm     Bathing assist Assist Level: Maximal Assistance - Patient 24 - 49%     Upper Body Dressing/Undressing Upper body dressing Upper body dressing/undressing activity did not occur (including orthotics): Refused (asked. pt did not want to change shirt. ) What is the patient wearing?: Pull over shirt    Upper body assist Assist Level: Moderate Assistance - Patient 50 - 74%    Lower Body Dressing/Undressing Lower body dressing      What is the patient wearing?: Pants, Incontinence brief     Lower body assist Assist for lower body dressing: Maximal Assistance - Patient 25 - 49%     Toileting Toileting Toileting Activity did not occur (Clothing management and hygiene only): N/A (no void or bm)  Toileting assist Assist for toileting: Set up assist (Urinal)     Transfers Chair/bed transfer  Transfers assist  Chair/bed transfer activity did not occur: Safety/medical concerns  Chair/bed transfer assist level: Maximal Assistance - Patient 25 - 49%     Locomotion Ambulation   Ambulation assist   Ambulation activity did not occur: Safety/medical concerns  Assist level: 2 helpers Assistive device: Lite Gait Max distance: 10 ft   Walk 10 feet activity   Assist  Walk 10  feet activity did not occur: Safety/medical concerns  Assist level: 2 helpers Assistive device: Lite Gait   Walk 50 feet activity   Assist Walk 50 feet with 2 turns activity did not occur: Safety/medical concerns  Assist level: 2 helpers Assistive device: Hand held assist    Walk 150  feet activity   Assist Walk 150 feet activity did not occur: Safety/medical concerns         Walk 10 feet on uneven surface  activity   Assist Walk 10 feet on uneven surfaces activity did not occur: Safety/medical concerns         Wheelchair     Assist Will patient use wheelchair at discharge?: Yes Type of Wheelchair: Manual Wheelchair activity did not occur: Safety/medical concerns  Wheelchair assist level: Minimal Assistance - Patient > 75% Max wheelchair distance: 150    Wheelchair 50 feet with 2 turns activity    Assist    Wheelchair 50 feet with 2 turns activity did not occur: Safety/medical concerns   Assist Level: Minimal Assistance - Patient > 75%   Wheelchair 150 feet activity     Assist  Wheelchair 150 feet activity did not occur: Safety/medical concerns   Assist Level: Minimal Assistance - Patient > 75%   Blood pressure 111/79, pulse 82, temperature 97.8 F (36.6 C), temperature source Oral, resp. rate 16, height 5\' 3"  (1.6 m), weight 78.3 kg, SpO2 98 %.  Medical Problem List and Plan: 1.Left side hemiparesis/dysarthriasecondary to rightthalamocapsularhemorrhage most likely related to hypertensive crisis as well as old left temporal parietal infarction with small vessel disease  Continue CIR  -WHO LUE, PRAFO nightly  Team conference 7/27 2. Antithrombotics: -DVT/anticoagulation:Continue SCDs -antiplatelet therapy: N/A 3. Pain Management:Tylenol as needed  7/26- Will try Tramadol 50 mg BID prn if tylenol not enough- however, can't because has hx of seizures- will give norco prn  7/28: pain is well controlled.  4. Mood:Zoloft 25 mg daily -antipsychotic agents: Geodon 40 mg nightly -mood seems for the most part well controlled 5. Neuropsych: This patientis notcapable of making decisions on hisown behalf. 6. Skin/Wound Care:Continue local care to hand, elevate LUE while in bed/chair 7.  Fluids/Electrolytes/Nutrition:.  -protein supp 8. Post stroke dysphagia. Dysphagia #3 nectar thick liquids. Advance perspeech therapy  9. History of seizure disorder. Vimpat 200 mg twice daily, Keppra 1500 mg twice daily, Depakote 500 mg twice daily.  10. Hypertension. Coreg 3.125 mg twice daily.    7/28: BP is well controlled 11. PAF.  -No anticoagulationdue to history of left MCA infarction with hemorrhagic conversion 12. Hyperlipidemia. Resumed Lipitor   13. Obesity. BMI 30. Dietary follow-up 14. CAD with CABG 2019. No chest pain or shortness of breath. 15.Mild thrombocytopenia: Resolved 16. Neurogenic bladder:   - I/O cath prn, monitor PVR's, increased checks to every 6 horus  -OOB to void  -pt completed 5 days of macrodantin for 80k staph epi  Continue same, repeat UA negative, urine culture remains pending  7/26- No growth on Cx 17. Cognitive deficits: Continue SLP with focus on attention, initiation  >35 minutes spent in patient care including physical examination and discussion with patient and discussion with SW and 8/26 at his insurance company for peer-to-peer for SNF placement.   LOS: 16 days A FACE TO FACE EVALUATION WAS PERFORMED  Wellsite geologist Oslo Huntsman 08/11/2019, 10:55 AM

## 2019-08-11 NOTE — Progress Notes (Signed)
Occupational Therapy Session Note  Patient Details  Name: [name] Cruz MRN: [id number] Date of Birth: 1955/10/10  Today's Date: 08/11/2019 OT Individual Time: 6666-4861 OT Individual Time Calculation (min): 60 min    Short Term Goals: Week 1:  OT Short Term Goal 1 (Week 1): Pt will complete toilet transfer with max A of 1 OT Short Term Goal 1 - Progress (Week 1): Progressing toward goal OT Short Term Goal 2 (Week 1): Pt will maintain sitting balance at EOB with no more than min A within BADL tasks OT Short Term Goal 2 - Progress (Week 1): Progressing toward goal OT Short Term Goal 3 (Week 1): Pt will wash upper body with no more than mod verbal cues to move onto next body part OT Short Term Goal 3 - Progress (Week 1): Met  Skilled Therapeutic Interventions/Progress Updates:    1:1. Pt received in bed agreeable to OT. Pt already dressed. Pt completes supine HOB elevated>EOB with MOD A overall. Pt completes all stedy transfers with MIN A for power up and MOD A to facilitate terminal hip extension and weight shift R. Attempted standing card sorting task reaching far R to obtain card, however pt unable to maintain balance in standing at high low table when looking L for match. Changed task to only reaching R to obtain cards off vertical bord and place at midline for midline orientation. Pt completes seated and perched in stedy reaching R for weight shifting while at dynavision with 2-3 second reaction timing for all quadrants. Pt returned to bed, exit alarm on and call light in reach.   Therapy Documentation Precautions:  Precautions Precautions: Fall Precaution Comments: L hemi, L inattention, pusher Restrictions Weight Bearing Restrictions: No General:   Vital Signs:   Pain: Pain Assessment Pain Scale: 0-10 Pain Score: 0-No pain ADL: ADL Eating: Minimal assistance Grooming: Maximal assistance Upper Body Bathing: Maximal assistance Lower Body Bathing: Maximal assistance Upper  Body Dressing: Moderate assistance Lower Body Dressing: Maximal assistance Toileting: Unable to assess Toilet Transfer: Maximal assistance (+2 using Stedy) Vision   Perception    Praxis   Exercises:   Other Treatments:     Therapy/Group: Individual Therapy  Tonny Branch 08/11/2019, 11:57 AM

## 2019-08-11 NOTE — Progress Notes (Addendum)
Pt refused to wear prefor and hand splint this evening. Pt was educated.  Rita Vialpando, lpn

## 2019-08-11 NOTE — Progress Notes (Signed)
Patient ID: [name] Cruz, male   DOB: Jul 09, 1955, 64 y.o.   MRN: [id number]  SW received message from [facility] today indicating that SNF auth requires peer to peer that must be completed by 12:15pm; attending must contact [phone] option #5. SW informed attending. SW waiting on follow-up.   SW submitted expedited appeal with [employer] indicating there is a 72hr approval window. Ref ID #[id number].  Cecile Sheerer, MSW, LCSWA Office: [phone] Cell: [phone] Fax: [phone]

## 2019-08-11 NOTE — Progress Notes (Signed)
Physical Therapy Session Note  Patient Details  Name: [name] Cruz MRN: [id number] Date of Birth: November 15, 1955  Today's Date: 08/11/2019 PT Individual Time: 9211-9417 PT Individual Time Calculation (min): 55 min   Short Term Goals: Week 3:  PT Short Term Goal 1 (Week 3): Pt will propel w/c 50 ft consistently with min assist. PT Short Term Goal 2 (Week 3): Pt will demonstrate dynamic sitting balance x 3 minutes with no more than mod assist.  Skilled Therapeutic Interventions/Progress Updates:  Pt received in bed & agreeable to tx. Supine>sit with PT placing RUE on bed rails and pt transferring supine>sitting EOB with mod assist to upright trunk to sitting. Stand pivot to w/c on R with mod assist +2 as pt sits before fully pivoting to w/c 2/2 impaired awareness. In gym, pt transfers w/c>mat table on R with mod assist +2, mat table>w/c on L with max assist +2 as pt with reduced ability to pivot & unable to step towards w/c on L. While sitting on EOM, utilized mirror for visual feedback with pt requiring cuing to attend to mirror & posture & able to recognize L lateral lean with cuing but appears unable to feel when he's leaning to L. Pt requires max cuing and min/mod assist to correct L lateral lean. Pt engaged in reaching for objects on R slightly higher than shoulder height & on floor with focus on weight shifting to R hip as pt continues to push L. Also placed LLE on step to attempt to facilitate weight shift to R and had pt lean on window on R side. Pt demonstrates significant cognitive deficits (impaired ability to follow commands & process information), frequently becoming internally distracted. Once back in w/c pt engaged in dynavision from w/c level with focus on scanning L<>R with slightly reduced reaction time to left side of board. Then progressed to pt pressing lights on R side of board with activity focusing on leaning/weight shifting to R. At end of session pt left in w/c with chair alarm  donned, call bell & all needs in reach.  Therapy Documentation Precautions:  Precautions Precautions: Fall Precaution Comments: L hemi, L inattention, pusher Restrictions Weight Bearing Restrictions: No  Pain: No c/o pain reported   Therapy/Group: Individual Therapy  TAMMIE ELLSWORTH 08/11/2019, 2:03 PM

## 2019-08-12 ENCOUNTER — Encounter (HOSPITAL_COMMUNITY): Payer: Medicare Other | Admitting: Speech Pathology

## 2019-08-12 ENCOUNTER — Inpatient Hospital Stay (HOSPITAL_COMMUNITY): Payer: Medicare Other | Admitting: Occupational Therapy

## 2019-08-12 ENCOUNTER — Inpatient Hospital Stay (HOSPITAL_COMMUNITY): Payer: Medicare Other

## 2019-08-12 NOTE — Progress Notes (Signed)
Physical Therapy Session Note  Patient Details  Name: [name] Cruz MRN: [id number] Date of Birth: 1955/04/28  Today's Date: 08/12/2019 PT Individual Time: 1412-1500 PT Individual Time Calculation (min): 48 min   Short Term Goals: Week 3:  PT Short Term Goal 1 (Week 3): Pt will propel w/c 50 ft consistently with min assist. PT Short Term Goal 2 (Week 3): Pt will demonstrate dynamic sitting balance x 3 minutes with no more than mod assist.  Skilled Therapeutic Interventions/Progress Updates:   Pt received seated on mat table as handoff from OT. Pt agreeable to therapy session. In sitting, pt continues to demo L lateral lean/pushing or posterior lean with impaired midline orientation and lack of awareness of lean/LOB requiring intermittent min assist to regain balance with max simple verbal cuing - pt responds best to external target to touch with his R hand to find midline. Repeated reclined stretch on mat with R foot on 4" step to lengthen R hip flexors as pt demos significant ROM limitations x64minutes - anticipate this is the likely cause of his lack of L forward pelvic shift onto L LE during stance resulting in L knee hyperextension during stance and sustained anterior pelvic tilt/hip flexion posturing throughout gait. Performed squat/stand pivot transfers L/R w/c<>EOM with mod assist - demos posterior trunk lean upon attempts at coming to stand (lack of forward trunk flexion to clear hips from mat) during transfer to L and demos L lean with more squat posturing during transfers to R (continues to respond well to external target to reach for with R hand during R transfers). Transitioned from sitting on mat to sitting straddling bench with +2 assist for safety. While sitting on bench performed R lateral lean/pelvic tilt task of grasping and matching cards focusing on improved midline orientation and decreased pushing. R squat pivot from bench>w/c with mod assist (+2 CGA for safety). Gait training  ~42ft at R hallway rail with light mod assist of 1 - cuing for increased L step length - pt demos improving upright posture and improving L forward weight shift during L stance as opposed to prior to performing hip flexor stretch although continues with L knee hyperextension during stance. Placed wedge in L shoe. Repeated ambulation ~50ft at R hallway rail and demos decreasing L knee hyperextension and continues with above improving gait mechanics; however, with fatigue has decreased R weight shift during stance causing poor L LE foot clearance and advancement during swing. Attempted transition to ambulation with RW but pt demos poorer R weight shift during stance and poorer L LE advancement during swing - anticipate this is due to pt's fear of falling without steady handrail support - transitioned back to hallway rail and ambulated another~65ft with mod assist of 1. Standing at R hallway rail attempted L LE foot taps on 4" step to promote increased R LE weight shift - when pt using railing had improved weight shift as he pulled himself that direction but when transitioning his hand to +2 assist's shoulder had worsening L lean - was able to safely step on/off 4" step using R UE support with heavier mod assist of 1 and +2 assist as needed for safety. Pt continues to demo impaired motor planning, impaired awareness, delayed initiation, and impaired attention that impact his ability to follow commands during interventions. Pt transported back to room in w/c and left sitting with needs in reach and seat belt alarm on.   Therapy Documentation Precautions:  Precautions Precautions: Fall Precaution Comments: L hemi, L  inattention, pusher Restrictions Weight Bearing Restrictions: No  Pain:   No reports of pain throughout session.   Therapy/Group: Individual Therapy  Ginny Forth , PT, DPT, CSRS  08/12/2019, 3:28 PM

## 2019-08-12 NOTE — Progress Notes (Signed)
Occupational Therapy Session Note  Patient Details  Name: [name] Cruz MRN: [id number] Date of Birth: 1955-06-16  Today's Date: 08/12/2019 OT Individual Time: 5003-7048 OT Individual Time Calculation (min): 58 min    Short Term Goals: Week 3:  OT Short Term Goal 1 (Week 3): Pt will demonstrate self ROM with min verbal cues OT Short Term Goal 2 (Week 3): Pt will complete toilet transfer with max A of 1 OT Short Term Goal 3 (Week 3): Pt will tolerate sitting unsupported for 10 minutes with no more than min A for static balance  Skilled Therapeutic Interventions/Progress Updates:    Pt greeted upright in bed finishing breakfast. Pt had eaten everything on R side of the plate, but had not touched L side. OT provided cues for attention to L side and pt able to take a few more bites from L side of plate before reporting he was full. OT treatment session focused on self-care retraining at shower level today. Pt completed bed mobility with mod A to bring LEs to EOB and max A to elevate trunk. Stedy used for transfer from bed > BSC placed in shower to act as shower chair. Pt needed max A to come to standing and bring hips forward enough to place Stedy paddles. Pt with lateral lean and pushing to the L while transferring in [place]. OT used shower head placed on R side of body to get pt to lean head and trunk to the R and decrease pushing. Pt needed max cues throughout session to try to bring body to midline as pt with more pushing and lateral lean to the L with increased task demand of shower. Pt perseverating on washing his hair, but eventually able to follow commands to move to next body part. Overall max A for bathing. Stedy transfer out of shower in similar fashion. Dressing completed from wc with improved attention to L side to help thread L UE into shirt. Worked on anterior weight shirt to the R to help thread R LE into pants. Max A sit<>stand while +2 assisted with pulling up pants. OT placed SAEBO  e-stim on shoulder . SAEBO left on for 60 minutes. OT returned to remove SAEBO with skin intact and no adverse reactions.  [place] Stim One 330 pulse width 35 Hz pulse rate On 8 sec/ off 8 sec Ramp up/ down 2 sec Symmetrical Biphasic wave form  Max intensity 129m at 500 Ohm load  Pt left seated in wc with L UE supported on lap tray, call bell in reach, alarm belt on, and needs met.   Therapy Documentation Precautions:  Precautions Precautions: Fall Precaution Comments: L hemi, L inattention, pusher Restrictions Weight Bearing Restrictions: No Pain: Denies pain  Therapy/Group: Individual Therapy  [name]7/29/2021, 8:31 AM

## 2019-08-12 NOTE — Progress Notes (Signed)
Occupational Therapy Session Note  Patient Details  Name: [name] Cruz MRN: [id number] Date of Birth: [birth year]  Today's Date: 08/12/2019 OT Individual Time: 1345-1415 OT Individual Time Calculation (min): 30 min    Short Term Goals: Week 1:  OT Short Term Goal 1 (Week 1): Pt will complete toilet transfer with max A of 1 OT Short Term Goal 1 - Progress (Week 1): Progressing toward goal OT Short Term Goal 2 (Week 1): Pt will maintain sitting balance at EOB with no more than min A within BADL tasks OT Short Term Goal 2 - Progress (Week 1): Progressing toward goal OT Short Term Goal 3 (Week 1): Pt will wash upper body with no more than mod verbal cues to move onto next body part OT Short Term Goal 3 - Progress (Week 1): Met Week 2:  OT Short Term Goal 1 (Week 2): Pt will demonstrate improved awareness of L UE by placing L arm into shirt sleeve with mod verbal cues OT Short Term Goal 1 - Progress (Week 2): Met OT Short Term Goal 2 (Week 2): Pt will complete sit<>stand with max A of 1 in preparation for BADL tasks OT Short Term Goal 2 - Progress (Week 2): Met OT Short Term Goal 3 (Week 2): Pt will maintain sitting balance seated EOB with no more than mod A for 3 minutes in preparation for BADL task. OT Short Term Goal 3 - Progress (Week 2): Met Week 3:  OT Short Term Goal 1 (Week 3): Pt will demonstrate self ROM with min verbal cues OT Short Term Goal 2 (Week 3): Pt will complete toilet transfer with max A of 1 OT Short Term Goal 3 (Week 3): Pt will tolerate sitting unsupported for 10 minutes with no more than min A for static balance  Skilled Therapeutic Interventions/Progress Updates:    1:1 Pt received in the bed. Focus on bed mobility: coming to EOB from a flat bed off the right side of the bed. Pt required max instructional cues for sequencing coming to the EOB with min A with extra time. Max A to thread pants and mod A for sit to stand with facilitation for maintaining forward weight  shift. Donned shoes with focus on fastening them to achieve anterior pelvic tilt and maintain forward balance instead of posterior. Pt perform stand pivot intot he w/c with global cues with mod A with extra time squat step pivot.   NMR: Ambulated ~ 25 feet at the rail at pt's request with focus on weight shifting to the right and maintaining upright trunk posture. Pt noted to have difficulty with right hip extension resulting in flexed posture on the right; also resulting in left hip posterior position and flexion. Transferred to the mat to the right with mod A and taking in supine position with Les hanging off the edge of mat. Too much of a stretch for right hip extensors so 4 inch block put under right foot to still allow a stretch but not too much.  PT joined session at this time - see their note for the rest of the session.   Therapy Documentation Precautions:  Precautions Precautions: Fall Precaution Comments: L hemi, L inattention, pusher Restrictions Weight Bearing Restrictions: No General:   Vital Signs: Therapy Vitals Temp: 97.9 F (36.6 C) Pulse Rate: 78 Resp: 17 BP: 109/75 Patient Position (if appropriate): Lying Oxygen Therapy SpO2: 92 % O2 Device: Room Air Pain:  no c/o pain   Therapy/Group: Individual Therapy  Willeen Cass  [place] 08/12/2019, 4:15 PM

## 2019-08-12 NOTE — Progress Notes (Signed)
Physical Therapy Session Note  Patient Details  Name: [name] Cruz MRN: [id number] Date of Birth: Jun 01, 1955  Today's Date: 08/12/2019 PT Individual Time: 1033-1100 PT Individual Time Calculation (min): 27 min   Short Term Goals: Week 3:  PT Short Term Goal 1 (Week 3): Pt will propel w/c 50 ft consistently with min assist. PT Short Term Goal 2 (Week 3): Pt will demonstrate dynamic sitting balance x 3 minutes with no more than mod assist.    Skilled Therapeutic Interventions/Progress Updates:    Pt received sitting in WC and agreeable to PT. Pt performed WC mobility x 153f with supervision assist and mod-max cues for improved coordination of RUE/RLE to prevent veer to the L.   Pt performed dynamic gait training with RW forward/reverse 2x 159feach direction. With mod-max assist +2 for WC follow. Max cues for improved midline orientation, weight shifting over the RLE, sequencing and awareness of LLE position with limb advancement.   Pt returned to room and performed stand pivot transfer to bed with mod assist to the R. Sit>supine completed with min assist for control of the LLE, and left supine in bed with call bell in reach and all needs met.         Therapy Documentation Precautions:  Precautions Precautions: Fall Precaution Comments: L hemi, L inattention, pusher Restrictions Weight Bearing Restrictions: No    Pain: denies Therapy/Group: Individual Therapy  AuLorie Phenix/29/2021, 11:06 AM

## 2019-08-12 NOTE — Progress Notes (Signed)
[place] PHYSICAL MEDICINE & REHABILITATION PROGRESS NOTE   Subjective/Complaints: [name] Cruz has no complaints this morning. Yesterday peer-to-peer was denied for SNF. Decision to be appealed MBS showed improved swallow function Says his therapy session went well this morning.   ROS:  Pt denies SOB, abd pain, CP, N/V/C/D, and vision changes  Objective:   DG Swallowing Func-Speech Pathology  Result Date: 08/12/2019 Objective Swallowing Evaluation: Type of Study: MBS-Modified Barium Swallow Study  Patient Details Name: [name] Cruz MRN: [id number] Date of Birth: [birth year] Today's Date: 08/12/2019 Past Medical History: Past Medical History: Diagnosis Date . Acute deep vein thrombosis (DVT) of left upper extremity (HCC)   L brachial and basilic veins, eliquis started 08/22/2017 to continue for 3 months total  . Cognitive impairment 2007  after stroke, saw rehab but told to stop because was too upsetting to him . History of chicken pox  . HLD (hyperlipidemia)  . HTN (hypertension)  . NSTEMI (non-ST elevated myocardial infarction) (HCC) 02/21/2017 . Obesity  . Stroke, hemorrhagic (HCC) 2007  thought 2/2 HTN (240sbp); residual cognitive impairment, loss of R peripheral field, no driving Past Surgical History: Past Surgical History: Procedure Laterality Date . ANKLE SURGERY  1990s  right foot with plate and screws . CORONARY ARTERY BYPASS GRAFT N/A 02/24/2017  3v Procedure: CORONARY ARTERY BYPASS GRAFTING (CABG) x 3 ON PUMP USING LEFT INTERNAL MAMMARY ARTERY TO LEFT ANTERIOR DESENDING CORNARY ARTERY, RIGHT GREATER SAPHENOUS VEIN TO LEFT CIRCUMFLEX ARTERY AND POSTERIOR DESENDING ARTERY. RIGHT GREATER SAPHENOUS VEIN OBTAINED VIA ENDOVEIN HARVEST.;  Surgeon: Delight Ovens, MD . IABP INSERTION N/A 02/21/2017  Procedure: IABP Insertion;  Surgeon: Yvonne Kendall, MD;  Location: [facility];  Service: Cardiovascular;  Laterality: N/A; . LEFT HEART CATH AND CORONARY ANGIOGRAPHY N/A 02/21/2017  Procedure:  LEFT HEART CATH AND CORONARY ANGIOGRAPHY;  Surgeon: Yvonne Kendall, MD;  Location: [facility];  Service: Cardiovascular;  Laterality: N/A; . TEE WITHOUT CARDIOVERSION N/A 02/24/2017  Procedure: TRANSESOPHAGEAL ECHOCARDIOGRAM (TEE);  Surgeon: Delight Ovens, MD;  Location: [facility] OR;  Service: Open Heart Surgery;  Laterality: N/A; HPI: Pt is a 64 yo male presenting with sudden onset L sided weakness. CT showed R thalamic hemorrhage. PMH: DVT, hemorrhagic stroke with residual cognitive-linguistic impairment, HTN, HLD, seizures, NSTEMI  Subjective: alert, cooperative, limited historian Assessment / Plan / Recommendation CHL IP CLINICAL IMPRESSIONS 08/12/2019 Clinical Impression Pt demonstrates markedly improved oropharyngeal swallow function today in comparison to last MBSS conducted 07/21/19. Swallow initiation is more timely, occurring more consistently at the level of the vallecula and only intermittently in the pyriforms, but no pooling was noted. He also demonstrates improved oral control of boluses regardless of size, as premature spillage only noted X1 and no airway intrusion occurred during that instance. A trace amount of thin barium penetrated into the laryngeal vestibule X2, however it remained above the level of the vocal folds and was completely ejected during the swallow, or with a sensed volitional dry swallow (PAS score 2). Although piecemeal swallowing noted when pt consumed barium pill with thin via straw, no penetration or aspiration observed. Recommend pt continue dysphagia 3 solid textures, upgrade to thin liquids using small sips, slow rate, straw are okay, and intermittently cue pt for dry swallows during liquid intake. Medications may be administered 1 at a time with thin or in purees. Please provide full supervision during meals to ensure safest intake and adherence to swallow precautions. SLP Visit Diagnosis Dysphagia, oropharyngeal phase (R13.12) Attention and concentration deficit  following -- Frontal lobe and executive function deficit following -- Impact on safety and function Mild aspiration risk   CHL IP TREATMENT RECOMMENDATION 07/21/2019 Treatment Recommendations Therapy as outlined in treatment plan below   Prognosis 07/21/2019 Prognosis for Safe Diet Advancement Good Barriers to Reach Goals Cognitive deficits;Language deficits Barriers/Prognosis Comment -- CHL IP DIET RECOMMENDATION 08/12/2019 SLP Diet Recommendations Dysphagia 3 (Mech soft) solids;Thin liquid Liquid Administration via Straw;Cup Medication Administration Whole meds with liquid Compensations Slow rate;Small sips/bites;Monitor for anterior loss;Minimize environmental distractions Postural Changes Seated upright at 90 degrees   CHL IP OTHER RECOMMENDATIONS 08/12/2019 Recommended Consults -- Oral Care Recommendations Oral care BID Other Recommendations --   CHL IP FOLLOW UP RECOMMENDATIONS 07/26/2019 Follow up Recommendations Inpatient Rehab   CHL IP FREQUENCY AND DURATION 07/21/2019 Speech Therapy Frequency (ACUTE ONLY) min 2x/week Treatment Duration 2 weeks      CHL IP ORAL PHASE 08/12/2019 Oral Phase Impaired Oral - Pudding Teaspoon -- Oral - Pudding Cup -- Oral - Honey Teaspoon -- Oral - Honey Cup -- Oral - Nectar Teaspoon -- Oral - Nectar Cup NT Oral - Nectar Straw NT Oral - Thin Teaspoon -- Oral - Thin Cup Decreased bolus cohesion Oral - Thin Straw Decreased bolus cohesion;Premature spillage Oral - Puree NT Oral - Mech Soft NT Oral - Regular -- Oral - Multi-Consistency -- Oral - Pill Lingual pumping;Delayed oral transit;Decreased bolus cohesion;Piecemeal swallowing;Reduced posterior propulsion Oral Phase - Comment --  CHL IP PHARYNGEAL PHASE 08/12/2019 Pharyngeal Phase Impaired Pharyngeal- Pudding Teaspoon -- Pharyngeal -- Pharyngeal- Pudding Cup -- Pharyngeal -- Pharyngeal- Honey Teaspoon -- Pharyngeal -- Pharyngeal- Honey Cup -- Pharyngeal -- Pharyngeal- Nectar Teaspoon -- Pharyngeal -- Pharyngeal- Nectar Cup NT Pharyngeal  -- Pharyngeal- Nectar Straw NT Pharyngeal -- Pharyngeal- Thin Teaspoon -- Pharyngeal -- Pharyngeal- Thin Cup Delayed swallow initiation-vallecula;Delayed swallow initiation-pyriform sinuses;Reduced airway/laryngeal closure;Penetration/Aspiration during swallow;Pharyngeal residue - pyriform;Pharyngeal residue - valleculae Pharyngeal Material enters airway, remains ABOVE vocal cords then ejected out Pharyngeal- Thin Straw Delayed swallow initiation-vallecula;Reduced airway/laryngeal closure;Penetration/Aspiration during swallow Pharyngeal Material enters airway, remains ABOVE vocal cords then ejected out Pharyngeal- Puree NT Pharyngeal -- Pharyngeal- Mechanical Soft NT Pharyngeal -- Pharyngeal- Regular -- Pharyngeal -- Pharyngeal- Multi-consistency -- Pharyngeal -- Pharyngeal- Pill -- Pharyngeal -- Pharyngeal Comment --  CHL IP CERVICAL ESOPHAGEAL PHASE 08/12/2019 Cervical Esophageal Phase WFL Pudding Teaspoon -- Pudding Cup -- Honey Teaspoon -- Honey Cup -- Nectar Teaspoon -- Nectar Cup -- Nectar Straw -- Thin Teaspoon -- Thin Cup -- Thin Straw -- Puree -- Mechanical Soft -- Regular -- Multi-consistency -- Pill -- Cervical Esophageal Comment -- [name] Cruz 08/12/2019, 9:48 AM              No results for input(s): WBC, HGB, HCT, PLT in the last 72 hours. No results for input(s): NA, K, CL, CO2, GLUCOSE, BUN, CREATININE, CALCIUM in the last 72 hours.  Intake/Output Summary (Last 24 hours) at 08/12/2019 1037 Last data filed at 08/12/2019 0658 Gross per 24 hour  Intake 598 ml  Output 350 ml  Net 248 ml     Physical Exam: Vital Signs Blood pressure 116/78, pulse 82, temperature 98.1 F (36.7 C), resp. rate 16, height 5\' 3"  (1.6 m), weight 78.3 kg, SpO2 97 %. General: Alert and oriented x 3, No apparent distress HEENT: Head is normocephalic, atraumatic, PERRLA, EOMI, sclera anicteric, oral mucosa pink and moist, dentition intact, ext ear canals clear,  Neck: Supple without JVD or lymphadenopathy Heart: Reg  rate and rhythm. No murmurs rubs or gallops  Chest: CTA bilaterally without wheezes, rales, or rhonchi; no distress Abdomen: Soft, non-tender, non-distended, bowel sounds positive. Extremities: No clubbing, cyanosis, or edema. Pulses are 2+ Skin: Warm and dry.  Intact. Psych: vague, talkative Musc: No edema in extremities.  No tenderness in extremities. Musc: No edema in extremities.  No tenderness in extremities. Neuro: Alert and oriented x1- a lot of word substitutions Motor: LUE 0/5 proximal to distal, unchanged LLE: HF, KE 3/5, ADF 1+/5, some improvement.  Assessment/Plan: 1. Functional deficits secondary to right thalamocapsular hemorrhage which require 3+ hours per day of interdisciplinary therapy in a comprehensive inpatient rehab setting.  Physiatrist is providing close team supervision and 24 hour management of active medical problems listed below.  Physiatrist and rehab team continue to assess barriers to discharge/monitor patient progress toward functional and medical goals  Care Tool:  Bathing    Body parts bathed by patient: Chest, Abdomen, Front perineal area, Right upper leg, Left upper leg, Face   Body parts bathed by helper: Buttocks, Right lower leg, Left lower leg, Right arm, Left arm     Bathing assist Assist Level: Maximal Assistance - Patient 24 - 49%     Upper Body Dressing/Undressing Upper body dressing Upper body dressing/undressing activity did not occur (including orthotics): Refused (asked. pt did not want to change shirt. ) What is the patient wearing?: Pull over shirt    Upper body assist Assist Level: Moderate Assistance - Patient 50 - 74%    Lower Body Dressing/Undressing Lower body dressing      What is the patient wearing?: Pants, Incontinence brief     Lower body assist Assist for lower body dressing: Maximal Assistance - Patient 25 - 49%     Toileting Toileting Toileting Activity did not occur (Clothing management and hygiene only):  N/A (no void or bm)  Toileting assist Assist for toileting: Set up assist (Urinal)     Transfers Chair/bed transfer  Transfers assist  Chair/bed transfer activity did not occur: Safety/medical concerns  Chair/bed transfer assist level: Maximal Assistance - Patient 25 - 49%     Locomotion Ambulation   Ambulation assist   Ambulation activity did not occur: Safety/medical concerns  Assist level: Maximal Assistance - Patient 25 - 49% Assistive device: Other (comment) (R rail) Max distance: 31 ft   Walk 10 feet activity   Assist  Walk 10 feet activity did not occur: Safety/medical concerns  Assist level: Maximal Assistance - Patient 25 - 49% Assistive device: Lite Gait   Walk 50 feet activity   Assist Walk 50 feet with 2 turns activity did not occur: Safety/medical concerns  Assist level: 2 helpers Assistive device: Hand held assist    Walk 150 feet activity   Assist Walk 150 feet activity did not occur: Safety/medical concerns         Walk 10 feet on uneven surface  activity   Assist Walk 10 feet on uneven surfaces activity did not occur: Safety/medical concerns         Wheelchair     Assist Will patient use wheelchair at discharge?: Yes Type of Wheelchair: Manual Wheelchair activity did not occur: Safety/medical concerns  Wheelchair assist level: Minimal Assistance - Patient > 75% Max wheelchair distance: 150    Wheelchair 50 feet with 2 turns activity    Assist    Wheelchair 50 feet with 2 turns activity did not occur: Safety/medical concerns   Assist Level: Minimal Assistance - Patient > 75%   Wheelchair 150 feet activity  Assist  Wheelchair 150 feet activity did not occur: Safety/medical concerns   Assist Level: Minimal Assistance - Patient > 75%   Blood pressure 116/78, pulse 82, temperature 98.1 F (36.7 C), resp. rate 16, height 5\' 3"  (1.6 m), weight 78.3 kg, SpO2 97 %.  Medical Problem List and Plan: 1.Left  side hemiparesis/dysarthriasecondary to rightthalamocapsularhemorrhage most likely related to hypertensive crisis as well as old left temporal parietal infarction with small vessel disease  Continue CIR  -WHO LUE, PRAFO nightly  Team conference held 7/27 2. Antithrombotics: -DVT/anticoagulation:Continue SCDs -antiplatelet therapy: N/A 3. Pain Management:Tylenol as needed  7/26- Will try Tramadol 50 mg BID prn if tylenol not enough- however, can't because has hx of seizures- will give norco prn  7/29: pain is well controlled.  4. Mood:Zoloft 25 mg daily -antipsychotic agents: Geodon 40 mg nightly -mood seems for the most part well controlled 5. Neuropsych: This patientis notcapable of making decisions on hisown behalf. 6. Skin/Wound Care:Continue local care to hand, elevate LUE while in bed/chair 7. Fluids/Electrolytes/Nutrition:.  -protein supp 8. Post stroke dysphagia. Dysphagia #3 nectar thick liquids. Advance perspeech therapy. MBS on 7/29 shows markedly improved oropharyngeal swallow function.  9. History of seizure disorder. Vimpat 200 mg twice daily, Keppra 1500 mg twice daily, Depakote 500 mg twice daily.  10. Hypertension. Coreg 3.125 mg twice daily.    7/29: BP well controlled 11. PAF.  -No anticoagulationdue to history of left MCA infarction with hemorrhagic conversion 12. Hyperlipidemia. Resumed Lipitor   13. Obesity. BMI 30. Dietary follow-up 14. CAD with CABG 2019. No chest pain or shortness of breath. 15.Mild thrombocytopenia: Resolved 16. Neurogenic bladder:   - I/O cath prn, monitor PVR's, increased checks to every 6 horus  -OOB to void  -pt completed 5 days of macrodantin for 80k staph epi  Continue same, repeat UA negative, urine culture remains pending  7/26- No growth on Cx 17. Cognitive deficits: Continue SLP with focus on attention, initiation   LOS: 17 days A FACE TO FACE  EVALUATION WAS PERFORMED  8/26 [name] Cruz 08/12/2019, 10:37 AM

## 2019-08-12 NOTE — Progress Notes (Signed)
Patient ID: [name] Cruz, male   DOB: 1955-10-18, 64 y.o.   MRN: [id number]   SW spoke with Will/[employer] [phone]) to get updates on expedited appeal. SW informed case remains open at this time.  Case ID #[id number] CO Ref TR#[id number]  Cecile Sheerer, MSW, LCSWA Office: [phone] Cell: [phone] Fax: [phone]

## 2019-08-13 ENCOUNTER — Inpatient Hospital Stay (HOSPITAL_COMMUNITY): Payer: Medicare Other | Admitting: Occupational Therapy

## 2019-08-13 ENCOUNTER — Ambulatory Visit (HOSPITAL_COMMUNITY): Payer: Medicare Other | Admitting: Occupational Therapy

## 2019-08-13 ENCOUNTER — Inpatient Hospital Stay (HOSPITAL_COMMUNITY): Payer: Medicare Other | Admitting: Speech Pathology

## 2019-08-13 LAB — GLUCOSE, CAPILLARY: Glucose-Capillary: 106 mg/dL — ABNORMAL HIGH (ref 70–99)

## 2019-08-13 NOTE — Progress Notes (Addendum)
Patient ID: [name] Cruz, male   DOB: May 31, 1955, 64 y.o.   MRN: [id number]  SW received message from message from [facility] with expedited appeals ([phone]/[phone]) requesting updated clinicals for pt. SW faxed updated clinicals.   SW resubmitted documents for Level II PASRR screening.   Cecile Sheerer, MSW, LCSWA Office: [phone] Cell: [phone] Fax: [phone]

## 2019-08-13 NOTE — Progress Notes (Signed)
Speech Language Pathology Daily Session Note  Patient Details  Name: [name] Cruz MRN: [id number] Date of Birth: [birth year]  Today's Date: 08/13/2019 SLP Individual Time: 8185-6314 SLP Individual Time Calculation (min): 45 min  Short Term Goals: Week 3: SLP Short Term Goal 1 (Week 3): Pt will participate in instrumental swallow assessment to determine least restrictive diet. SLP Short Term Goal 2 (Week 3): Patient will utilize external aids for recall of time and place with Mod A verbal and visual cues. SLP Short Term Goal 3 (Week 3): Patient will follow 1-step commands with 75% accuracy during functional tasks with Min A verbal and visual cues. SLP Short Term Goal 4 (Week 3): Patient will demonstrate basic problem solving for functional and familiar tasks with Mod A verbal and visual cues. SLP Short Term Goal 5 (Week 3): Patient will participate in structured language tasks and improve word-finding/naming to 75% accuracy and Min A multimodal cues. SLP Short Term Goal 6 (Week 3): Patient will verbalize wants/needs at the sentence level with Min A multimodal cues for word-finidng.  Skilled Therapeutic Interventions:   Patient seen to address language goals with focus on object picture and photo naming and word-level reading. He correctly identified printed words for 4/10. For printed word 'mirror' he said "window". He named 6/18 object photos and for ones he did not name he was able to describe function for 6/12. He named 11/20 object line drawings and for those he missed, he described function accurately for 7/9. When asked about recent swallowing test (yesterday), patient did not demonstrate recall and said he didn't notice a difference in the liquids he had now (he was upgraded from nectar to thin liquids.  Pain Pain Assessment Pain Scale: 0-10 Pain Score: 0-No pain  Therapy/Group: Individual Therapy  Angela Nevin, MA, CCC-SLP Speech Therapy

## 2019-08-13 NOTE — Progress Notes (Signed)
[place] PHYSICAL MEDICINE & REHABILITATION PROGRESS NOTE   Subjective/Complaints: No complaints this morning Denies pain, constipation, insomnia  ROS:  Pt denies SOB, abd pain, CP, N/V/C/D, and vision changes  Objective:   DG Swallowing Func-Speech Pathology  Result Date: 08/12/2019 Objective Swallowing Evaluation: Type of Study: MBS-Modified Barium Swallow Study  Patient Details Name: [name] Cruz MRN: [id number] Date of Birth: 12/04/1955 Today's Date: 08/12/2019 Past Medical History: Past Medical History: Diagnosis Date . Acute deep vein thrombosis (DVT) of left upper extremity (HCC)   L brachial and basilic veins, eliquis started 08/22/2017 to continue for 3 months total  . Cognitive impairment 2007  after stroke, saw rehab but told to stop because was too upsetting to him . History of chicken pox  . HLD (hyperlipidemia)  . HTN (hypertension)  . NSTEMI (non-ST elevated myocardial infarction) (HCC) 02/21/2017 . Obesity  . Stroke, hemorrhagic (HCC) 2007  thought 2/2 HTN (240sbp); residual cognitive impairment, loss of R peripheral field, no driving Past Surgical History: Past Surgical History: Procedure Laterality Date . ANKLE SURGERY  1990s  right foot with plate and screws . CORONARY ARTERY BYPASS GRAFT N/A 02/24/2017  3v Procedure: CORONARY ARTERY BYPASS GRAFTING (CABG) x 3 ON PUMP USING LEFT INTERNAL MAMMARY ARTERY TO LEFT ANTERIOR DESENDING CORNARY ARTERY, RIGHT GREATER SAPHENOUS VEIN TO LEFT CIRCUMFLEX ARTERY AND POSTERIOR DESENDING ARTERY. RIGHT GREATER SAPHENOUS VEIN OBTAINED VIA ENDOVEIN HARVEST.;  Surgeon: Delight Ovens, MD . IABP INSERTION N/A 02/21/2017  Procedure: IABP Insertion;  Surgeon: Yvonne Kendall, MD;  Location: [facility];  Service: Cardiovascular;  Laterality: N/A; . LEFT HEART CATH AND CORONARY ANGIOGRAPHY N/A 02/21/2017  Procedure: LEFT HEART CATH AND CORONARY ANGIOGRAPHY;  Surgeon: Yvonne Kendall, MD;  Location: [facility];  Service: Cardiovascular;   Laterality: N/A; . TEE WITHOUT CARDIOVERSION N/A 02/24/2017  Procedure: TRANSESOPHAGEAL ECHOCARDIOGRAM (TEE);  Surgeon: Delight Ovens, MD;  Location: [facility] OR;  Service: Open Heart Surgery;  Laterality: N/A; HPI: Pt is a 64 yo male presenting with sudden onset L sided weakness. CT showed R thalamic hemorrhage. PMH: DVT, hemorrhagic stroke with residual cognitive-linguistic impairment, HTN, HLD, seizures, NSTEMI  Subjective: alert, cooperative, limited historian Assessment / Plan / Recommendation CHL IP CLINICAL IMPRESSIONS 08/12/2019 Clinical Impression Pt demonstrates markedly improved oropharyngeal swallow function today in comparison to last MBSS conducted 07/21/19. Swallow initiation is more timely, occurring more consistently at the level of the vallecula and only intermittently in the pyriforms, but no pooling was noted. He also demonstrates improved oral control of boluses regardless of size, as premature spillage only noted X1 and no airway intrusion occurred during that instance. A trace amount of thin barium penetrated into the laryngeal vestibule X2, however it remained above the level of the vocal folds and was completely ejected during the swallow, or with a sensed volitional dry swallow (PAS score 2). Although piecemeal swallowing noted when pt consumed barium pill with thin via straw, no penetration or aspiration observed. Recommend pt continue dysphagia 3 solid textures, upgrade to thin liquids using small sips, slow rate, straw are okay, and intermittently cue pt for dry swallows during liquid intake. Medications may be administered 1 at a time with thin or in purees. Please provide full supervision during meals to ensure safest intake and adherence to swallow precautions. SLP Visit Diagnosis Dysphagia, oropharyngeal phase (R13.12) Attention and concentration deficit following -- Frontal lobe and executive function deficit following -- Impact on safety and function Mild aspiration risk   CHL IP  TREATMENT  RECOMMENDATION 07/21/2019 Treatment Recommendations Therapy as outlined in treatment plan below   Prognosis 07/21/2019 Prognosis for Safe Diet Advancement Good Barriers to Reach Goals Cognitive deficits;Language deficits Barriers/Prognosis Comment -- CHL IP DIET RECOMMENDATION 08/12/2019 SLP Diet Recommendations Dysphagia 3 (Mech soft) solids;Thin liquid Liquid Administration via Straw;Cup Medication Administration Whole meds with liquid Compensations Slow rate;Small sips/bites;Monitor for anterior loss;Minimize environmental distractions Postural Changes Seated upright at 90 degrees   CHL IP OTHER RECOMMENDATIONS 08/12/2019 Recommended Consults -- Oral Care Recommendations Oral care BID Other Recommendations --   CHL IP FOLLOW UP RECOMMENDATIONS 07/26/2019 Follow up Recommendations Inpatient Rehab   CHL IP FREQUENCY AND DURATION 07/21/2019 Speech Therapy Frequency (ACUTE ONLY) min 2x/week Treatment Duration 2 weeks      CHL IP ORAL PHASE 08/12/2019 Oral Phase Impaired Oral - Pudding Teaspoon -- Oral - Pudding Cup -- Oral - Honey Teaspoon -- Oral - Honey Cup -- Oral - Nectar Teaspoon -- Oral - Nectar Cup NT Oral - Nectar Straw NT Oral - Thin Teaspoon -- Oral - Thin Cup Decreased bolus cohesion Oral - Thin Straw Decreased bolus cohesion;Premature spillage Oral - Puree NT Oral - Mech Soft NT Oral - Regular -- Oral - Multi-Consistency -- Oral - Pill Lingual pumping;Delayed oral transit;Decreased bolus cohesion;Piecemeal swallowing;Reduced posterior propulsion Oral Phase - Comment --  CHL IP PHARYNGEAL PHASE 08/12/2019 Pharyngeal Phase Impaired Pharyngeal- Pudding Teaspoon -- Pharyngeal -- Pharyngeal- Pudding Cup -- Pharyngeal -- Pharyngeal- Honey Teaspoon -- Pharyngeal -- Pharyngeal- Honey Cup -- Pharyngeal -- Pharyngeal- Nectar Teaspoon -- Pharyngeal -- Pharyngeal- Nectar Cup NT Pharyngeal -- Pharyngeal- Nectar Straw NT Pharyngeal -- Pharyngeal- Thin Teaspoon -- Pharyngeal -- Pharyngeal- Thin Cup Delayed swallow  initiation-vallecula;Delayed swallow initiation-pyriform sinuses;Reduced airway/laryngeal closure;Penetration/Aspiration during swallow;Pharyngeal residue - pyriform;Pharyngeal residue - valleculae Pharyngeal Material enters airway, remains ABOVE vocal cords then ejected out Pharyngeal- Thin Straw Delayed swallow initiation-vallecula;Reduced airway/laryngeal closure;Penetration/Aspiration during swallow Pharyngeal Material enters airway, remains ABOVE vocal cords then ejected out Pharyngeal- Puree NT Pharyngeal -- Pharyngeal- Mechanical Soft NT Pharyngeal -- Pharyngeal- Regular -- Pharyngeal -- Pharyngeal- Multi-consistency -- Pharyngeal -- Pharyngeal- Pill -- Pharyngeal -- Pharyngeal Comment --  CHL IP CERVICAL ESOPHAGEAL PHASE 08/12/2019 Cervical Esophageal Phase WFL Pudding Teaspoon -- Pudding Cup -- Honey Teaspoon -- Honey Cup -- Nectar Teaspoon -- Nectar Cup -- Nectar Straw -- Thin Teaspoon -- Thin Cup -- Thin Straw -- Puree -- Mechanical Soft -- Regular -- Multi-consistency -- Pill -- Cervical Esophageal Comment -- Little Ishikawa 08/12/2019, 9:48 AM              No results for input(s): WBC, HGB, HCT, PLT in the last 72 hours. No results for input(s): NA, K, CL, CO2, GLUCOSE, BUN, CREATININE, CALCIUM in the last 72 hours.  Intake/Output Summary (Last 24 hours) at 08/13/2019 1411 Last data filed at 08/13/2019 0906 Gross per 24 hour  Intake 320 ml  Output 650 ml  Net -330 ml     Physical Exam: Vital Signs Blood pressure 112/66, pulse 67, temperature 98.6 F (37 C), temperature source Oral, resp. rate 17, height 5\' 3"  (1.6 m), weight 78.3 kg, SpO2 97 %. General: Alert and oriented x 3, No apparent distress HEENT: Head is normocephalic, atraumatic, PERRLA, EOMI, sclera anicteric, oral mucosa pink and moist, dentition intact, ext ear canals clear,  Neck: Supple without JVD or lymphadenopathy Heart: Reg rate and rhythm. No murmurs rubs or gallops Chest: CTA bilaterally without wheezes, rales, or  rhonchi; no distress Abdomen: Soft, non-tender, non-distended, bowel sounds positive. Extremities: No clubbing,  cyanosis, or edema. Pulses are 2+ Skin: Warm and dry.  Intact. Psych: vague, talkative Musc: No edema in extremities.  No tenderness in extremities. Musc: No edema in extremities.  No tenderness in extremities. Neuro: Alert and oriented x1- a lot of word substitutions Motor: LUE 0/5 proximal to distal, unchanged LLE: HF, KE 3/5, ADF 1+/5, some improvement.  Assessment/Plan: 1. Functional deficits secondary to right thalamocapsular hemorrhage which require 3+ hours per day of interdisciplinary therapy in a comprehensive inpatient rehab setting.  Physiatrist is providing close team supervision and 24 hour management of active medical problems listed below.  Physiatrist and rehab team continue to assess barriers to discharge/monitor patient progress toward functional and medical goals  Care Tool:  Bathing    Body parts bathed by patient: Chest, Abdomen, Front perineal area, Right upper leg, Left upper leg, Face   Body parts bathed by helper: Buttocks, Right lower leg, Left lower leg, Right arm, Left arm     Bathing assist Assist Level: Maximal Assistance - Patient 24 - 49%     Upper Body Dressing/Undressing Upper body dressing Upper body dressing/undressing activity did not occur (including orthotics): Refused (asked. pt did not want to change shirt. ) What is the patient wearing?: Pull over shirt    Upper body assist Assist Level: Moderate Assistance - Patient 50 - 74%    Lower Body Dressing/Undressing Lower body dressing      What is the patient wearing?: Pants, Incontinence brief     Lower body assist Assist for lower body dressing: Maximal Assistance - Patient 25 - 49%     Toileting Toileting Toileting Activity did not occur (Clothing management and hygiene only): N/A (no void or bm)  Toileting assist Assist for toileting: Set up assist (Urinal)      Transfers Chair/bed transfer  Transfers assist  Chair/bed transfer activity did not occur: Safety/medical concerns  Chair/bed transfer assist level: Maximal Assistance - Patient 25 - 49%     Locomotion Ambulation   Ambulation assist   Ambulation activity did not occur: Safety/medical concerns  Assist level: 2 helpers Assistive device: Hand held assist Max distance: 60 ft   Walk 10 feet activity   Assist  Walk 10 feet activity did not occur: Safety/medical concerns  Assist level: 2 helpers Assistive device: Hand held assist   Walk 50 feet activity   Assist Walk 50 feet with 2 turns activity did not occur: Safety/medical concerns  Assist level: 2 helpers Assistive device: Hand held assist    Walk 150 feet activity   Assist Walk 150 feet activity did not occur: Safety/medical concerns         Walk 10 feet on uneven surface  activity   Assist Walk 10 feet on uneven surfaces activity did not occur: Safety/medical concerns         Wheelchair     Assist Will patient use wheelchair at discharge?: Yes Type of Wheelchair: Manual Wheelchair activity did not occur: Safety/medical concerns  Wheelchair assist level: Moderate Assistance - Patient 50 - 74% Max wheelchair distance: 150    Wheelchair 50 feet with 2 turns activity    Assist    Wheelchair 50 feet with 2 turns activity did not occur: Safety/medical concerns   Assist Level: Moderate Assistance - Patient 50 - 74%   Wheelchair 150 feet activity     Assist  Wheelchair 150 feet activity did not occur: Safety/medical concerns   Assist Level: Moderate Assistance - Patient 50 - 74%   Blood pressure  112/66, pulse 67, temperature 98.6 F (37 C), temperature source Oral, resp. rate 17, height 5\' 3"  (1.6 m), weight 78.3 kg, SpO2 97 %.  Medical Problem List and Plan: 1.Left side hemiparesis/dysarthriasecondary to rightthalamocapsularhemorrhage most likely related to hypertensive  crisis as well as old left temporal parietal infarction with small vessel disease  Continue CIR  -WHO LUE, PRAFO nightly  Team conference held 7/27 2. Antithrombotics: -DVT/anticoagulation:Continue SCDs -antiplatelet therapy: N/A 3. Pain Management:Tylenol as needed  7/26- Will try Tramadol 50 mg BID prn if tylenol not enough- however, can't because has hx of seizures- will give norco prn  7/30: well controlled 4. Mood:Zoloft 25 mg daily -antipsychotic agents: Geodon 40 mg nightly -mood seems for the most part well controlled 5. Neuropsych: This patientis notcapable of making decisions on hisown behalf. 6. Skin/Wound Care:Continue local care to hand, elevate LUE while in bed/chair 7. Fluids/Electrolytes/Nutrition:.  -protein supp 8. Post stroke dysphagia. Dysphagia #3 nectar thick liquids. Advance perspeech therapy. MBS on 7/29 shows markedly improved oropharyngeal swallow function.  9. History of seizure disorder. Vimpat 200 mg twice daily, Keppra 1500 mg twice daily, Depakote 500 mg twice daily.  10. Hypertension. Coreg 3.125 mg twice daily.    7/30: well controlled 11. PAF.  -No anticoagulationdue to history of left MCA infarction with hemorrhagic conversion 12. Hyperlipidemia. Resumed Lipitor   13. Obesity. BMI 30. Dietary follow-up 14. CAD with CABG 2019. No chest pain or shortness of breath. 15.Mild thrombocytopenia: Resolved 16. Neurogenic bladder:   - I/O cath prn, monitor PVR's, increased checks to every 6 horus  -OOB to void, voiding  -pt completed 5 days of macrodantin for 80k staph epi  Continue same, repeat UA negative, urine culture remains pending  7/26- No growth on Cx 17. Cognitive deficits: Continue SLP with focus on attention, initiation   LOS: 18 days A FACE TO FACE EVALUATION WAS PERFORMED  Clint BolderKrutika P Yasheka Fossett 08/13/2019, 2:11 PM

## 2019-08-13 NOTE — Progress Notes (Signed)
Occupational Therapy Session Note  Patient Details  Name: [name] Cruz MRN: [id number] Date of Birth: 11-11-1955  Today's Date: 08/13/2019 OT Individual Time: 1941-7408 OT Individual Time Calculation (min): 74 min   Skilled Therapeutic Interventions/Progress Updates:    Pt greeted in the w/c with no c/o pain. Pt declined toileting or showering. Started tx by shaving w/c level at sink with vcs to locate his razor placed towards Lt of midline. Vcs also for shaving Lt side of face. Afterwards pt was escorted to the outdoor patio via w/c. Worked on [employer] UE NMR using UE ranger and active assist techniques with gravity minimized and also with added resistance. Pt did better with active assist techniques vs ranger, he perseverated on using his Rt hand to assist Lt when using this piece of equipment and was unable to be redirected. Pt exhibited min proximal activation in the shoulder today! We celebrated! Transitioned to focus on trunk control by having pt engage in an activity involving anterior and lateral reaching to target. Lt visual field scanning demands incorporated during this task as well as weightbearing through the Lt elbow. Lt visual field scanning when looking at flowers and water fountain in the environment. We also played 2 [employer] songs to work on pts verbal expression, pt mumbling correct words with voice inflection to match the singer's at this time. Near close of session pt was returned to his room via w/c. He then reported that he needed "to potty." +2 for Stedy transfer to elevated toilet, pt requires manual assist to offset Lt push and also for hip extension to allow clearance of Stedy paddles. Pt left in care of NT to void. NT made aware that pt will require +2 assist for hygiene and transferring off of toilet.    Therapy Documentation Precautions:  Precautions Precautions: Fall Precaution Comments: L hemi, L inattention, pusher Restrictions Weight Bearing Restrictions:  No Pain: Pain Assessment Pain Scale: 0-10 Pain Score: 0-No pain ADL: ADL Eating: Minimal assistance Grooming: Maximal assistance Upper Body Bathing: Maximal assistance Lower Body Bathing: Maximal assistance Upper Body Dressing: Moderate assistance Lower Body Dressing: Maximal assistance Toileting: Unable to assess Toilet Transfer: Maximal assistance (+2 using Stedy)      Therapy/Group: Individual Therapy  Kanesha Cadle A Gaynor Ferreras 08/13/2019, 4:05 PM

## 2019-08-13 NOTE — Progress Notes (Signed)
Physical Therapy Session Note  Patient Details  Name: [name] Cruz MRN: [id number] Date of Birth: [birth year]  Today's Date: 08/13/2019 PT Individual Time: 7322-0254 PT Individual Time Calculation (min): 70 min   Short Term Goals: Week 3:  PT Short Term Goal 1 (Week 3): Pt will propel w/c 50 ft consistently with min assist. PT Short Term Goal 2 (Week 3): Pt will demonstrate dynamic sitting balance x 3 minutes with no more than mod assist.  Skilled Therapeutic Interventions/Progress Updates:  Pt received asleep in bed but easily awakened & agreeable to tx. PT dons pants & shoes total assist for time management. Pt is able to roll L with cuing to use bed rail & min assist to allow PT to pull pants over hips & pt attempts to bridge but is unable to clear buttocks from bed. Pt transfers to sitting EOB, bringing BLE off EOB but requires mod assist to upright trunk. Stand pivot transfers to R throughout session with max assist to come to standing and max assist with cuing for stepping & facilitating safe pivot to w/c as pt does not turn entirely to seat. On EOM pt reclined on wedge & pillow with BLE feet on floor for hip flexor stretch with pt initially reporting it feels okay then after a minute or two reports "it bothers me" & requires max assist to return to sitting (2/2 decreased core strength & trunk control) & maintain BLE feet on floor. Pt is able to tolerate BLE on floor vs using step to support RLE for increased stretch today compared to yesterday. Pt is able to progress to elevated mat with BLE hanging for additional hip flexor stretch. Progressed to sit ups from wedge with pt requiring up to max assist to upright trunk with focus on anterior weight shifting & core strengthening with pt requiring max multimodal cuing for midline orientation. In front of parallel bars & mirror pt transfers sit>stand with min assist by pulling up on bar. Pt with improved midline orientation with decreased pushing L  on this date and able to lift BLE as if marching with mod assist/cuing to return to static standing & midline orientation. Gait x 60 ft with +2 HHA mod assist with PT facilitating increased weight shift R for increased ease of advancing L foot during swing phase with pt continuing to demonstrate slight slouched posture, unable to fully extend B hips and weight shift R. Pt propels w/c back to room with RUE only, not placing RLE on floor despite multimodal cuing with up to mod assist for steering. Pt left in w/c with chair alarm donned, call bell & all needs in reach.   Therapy Documentation Precautions:  Precautions Precautions: Fall Precaution Comments: L hemi, L inattention, pusher Restrictions Weight Bearing Restrictions: No  Pain: C/o unrated soreness in R hip flexors with stretching - repositioning provided PRN for comfort/pain relief.   Therapy/Group: Individual Therapy  HANNA AULTMAN 08/13/2019, 12:12 PM

## 2019-08-14 ENCOUNTER — Inpatient Hospital Stay (HOSPITAL_COMMUNITY): Payer: Medicare Other

## 2019-08-14 ENCOUNTER — Inpatient Hospital Stay (HOSPITAL_COMMUNITY): Payer: Medicare Other | Admitting: Occupational Therapy

## 2019-08-14 NOTE — Progress Notes (Signed)
Physical Therapy Session Note  Patient Details  Name: [name] Cruz MRN: [id number] Date of Birth: March 31, 1955  Today's Date: 08/14/2019 PT Individual Time: 1003-1100 PT Individual Time Calculation (min): 57 min   Short Term Goals: Week 3:  PT Short Term Goal 1 (Week 3): Pt will propel w/c 50 ft consistently with min assist. PT Short Term Goal 2 (Week 3): Pt will demonstrate dynamic sitting balance x 3 minutes with no more than mod assist.  Skilled Therapeutic Interventions/Progress Updates:   Pt received supine in bed and agreeable to PT, requesting use toilet for BM. Supine>sit transfer with  assist and mod assist on the R side of the bed with facilitation to improve trunkal activation.    Sit<>stand in stedy with min assist and then additional min assist to achieve full hip extension to bring seat down on stedy. Transported to toilet in stedy. Sitting balance on toilet with min-mod assist to maintain neutral midline position with no UE or LE support on toilet. Pt able to urinate, but unable to void bowels. Lower body dressing with total A for manage clothing and mod assist to achieve midline once standing in stedy.   Transported to [facility] with mod assist to return to sitting in [place], due to poor eccentric control on the RLE. Pt's hand removed from resting night splint, noted moderate pitting edema in the metacarpals and through the digits. PT applied KT tape with ~5% stretch to each digit to improve fluid return and decreased swelling to hand and L forearm.   Pt left sitting in WC with call bell in reach with all needs met.       Therapy Documentation Precautions:  Precautions Precautions: Fall Precaution Comments: L hemi, L inattention, pusher Restrictions Weight Bearing Restrictions: No    Pain: Pain Assessment Pain Scale: 0-10 Pain Score: 0-No pain   Therapy/Group: Individual Therapy  Lorie Phenix 08/14/2019, 11:12 AM

## 2019-08-14 NOTE — Progress Notes (Signed)
Occupational Therapy Session Note  Patient Details  Name: [name] Cruz MRN: [id number] Date of Birth: 02/26/1955  Today's Date: 08/14/2019 OT Individual Time: 1130-1200 OT Individual Time Calculation (min): 30 min    Short Term Goals: Week 3:  OT Short Term Goal 1 (Week 3): Pt will demonstrate self ROM with min verbal cues OT Short Term Goal 2 (Week 3): Pt will complete toilet transfer with max A of 1 OT Short Term Goal 3 (Week 3): Pt will tolerate sitting unsupported for 10 minutes with no more than min A for static balance  Skilled Therapeutic Interventions/Progress Updates:    pt received in w.c ready for therapy.  Focused on PROM of LUE for joint ROM, guided movement using slides ontop of pillow case on top of powder board (elb flex and ext, sh flex/ext), and tapping to facilitate movement.  It appeared that there was some active movement in elbow flexors, sh adductors but it was most likely flexor tone engaging.  Pt did keep saying, "oh it is getting better".   Tried to have pt work on body on arm rotations for gentle PROM to internal rotators, but very difficult to have pt follow directions due to apraxia and decreased attention span.   Overall, pt did engage in exercises well.  Pt continuing to rest in wc with all needs met. Belt alarm on.    Therapy Documentation Precautions:  Precautions Precautions: Fall Precaution Comments: L hemi, L inattention, pusher Restrictions Weight Bearing Restrictions: No   Pain: Pain Assessment Pain Score: 0-No pain ADL: ADL Eating: Minimal assistance Grooming: Maximal assistance Upper Body Bathing: Maximal assistance Lower Body Bathing: Maximal assistance Upper Body Dressing: Moderate assistance Lower Body Dressing: Maximal assistance Toileting: Unable to assess Toilet Transfer: Maximal assistance (+2 using Stedy)   Therapy/Group: Individual Therapy  , 08/14/2019, 1:41 PM  

## 2019-08-14 NOTE — Progress Notes (Signed)
[place] PHYSICAL MEDICINE & REHABILITATION PROGRESS NOTE   Subjective/Complaints:  Pt reports no pain- slept OK.  LBM yesterday- denies any issues.  Eating breakfast, but says he "might be done".     ROS:   Pt denies SOB, abd pain, CP, N/V/C/D, and vision changes   Objective:   No results found. No results for input(s): WBC, HGB, HCT, PLT in the last 72 hours. No results for input(s): NA, K, CL, CO2, GLUCOSE, BUN, CREATININE, CALCIUM in the last 72 hours.  Intake/Output Summary (Last 24 hours) at 08/14/2019 1336 Last data filed at 08/14/2019 1103 Gross per 24 hour  Intake 350 ml  Output 700 ml  Net -350 ml     Physical Exam: Vital Signs Blood pressure 120/73, pulse 76, temperature 98.2 F (36.8 C), resp. rate 16, height 5\' 3"  (1.6 m), weight 78.3 kg, SpO2 99 %. General: Alert, appropriate, eating breakfast, sitting up in bed, NAD HEENT: conjugate gaze Neck: Supple without JVD or lymphadenopathy Heart: RRR Chest: CTA B/L- no W/R/R- good air movement Abdomen: Soft, NT, ND, (+)BS  Extremities: No clubbing, cyanosis, or edema. Pulses are 2+ Skin: Warm and dry.  Intact. Psych:vague- focused on breakfast Musc: No edema in extremities.  No tenderness in extremities. Musc: No edema in extremities.  No tenderness in extremities. Neuro: Alert and oriented x1- a lot of word substitutions- no change Motor: LUE 0/5 proximal to distal, unchanged LLE: HF, KE 3/5, ADF 1+/5, some improvement.  Assessment/Plan: 1. Functional deficits secondary to right thalamocapsular hemorrhage which require 3+ hours per day of interdisciplinary therapy in a comprehensive inpatient rehab setting.  Physiatrist is providing close team supervision and 24 hour management of active medical problems listed below.  Physiatrist and rehab team continue to assess barriers to discharge/monitor patient progress toward functional and medical goals  Care Tool:  Bathing    Body parts bathed by patient:  Chest, Abdomen, Front perineal area, Right upper leg, Left upper leg, Face   Body parts bathed by helper: Buttocks, Right lower leg, Left lower leg, Right arm, Left arm     Bathing assist Assist Level: Maximal Assistance - Patient 24 - 49%     Upper Body Dressing/Undressing Upper body dressing Upper body dressing/undressing activity did not occur (including orthotics): Refused (asked. pt did not want to change shirt. ) What is the patient wearing?: Pull over shirt    Upper body assist Assist Level: Moderate Assistance - Patient 50 - 74%    Lower Body Dressing/Undressing Lower body dressing      What is the patient wearing?: Pants, Incontinence brief     Lower body assist Assist for lower body dressing: Maximal Assistance - Patient 25 - 49%     Toileting Toileting Toileting Activity did not occur (Clothing management and hygiene only): N/A (no void or bm)  Toileting assist Assist for toileting: Set up assist (Urinal)     Transfers Chair/bed transfer  Transfers assist  Chair/bed transfer activity did not occur: Safety/medical concerns  Chair/bed transfer assist level: Maximal Assistance - Patient 25 - 49%     Locomotion Ambulation   Ambulation assist   Ambulation activity did not occur: Safety/medical concerns  Assist level: 2 helpers Assistive device: Hand held assist Max distance: 60 ft   Walk 10 feet activity   Assist  Walk 10 feet activity did not occur: Safety/medical concerns  Assist level: 2 helpers Assistive device: Hand held assist   Walk 50 feet activity   Assist Walk 50 feet  with 2 turns activity did not occur: Safety/medical concerns  Assist level: 2 helpers Assistive device: Hand held assist    Walk 150 feet activity   Assist Walk 150 feet activity did not occur: Safety/medical concerns         Walk 10 feet on uneven surface  activity   Assist Walk 10 feet on uneven surfaces activity did not occur: Safety/medical  concerns         Wheelchair     Assist Will patient use wheelchair at discharge?: Yes Type of Wheelchair: Manual Wheelchair activity did not occur: Safety/medical concerns  Wheelchair assist level: Moderate Assistance - Patient 50 - 74% Max wheelchair distance: 150    Wheelchair 50 feet with 2 turns activity    Assist    Wheelchair 50 feet with 2 turns activity did not occur: Safety/medical concerns   Assist Level: Moderate Assistance - Patient 50 - 74%   Wheelchair 150 feet activity     Assist  Wheelchair 150 feet activity did not occur: Safety/medical concerns   Assist Level: Moderate Assistance - Patient 50 - 74%   Blood pressure 120/73, pulse 76, temperature 98.2 F (36.8 C), resp. rate 16, height 5\' 3"  (1.6 m), weight 78.3 kg, SpO2 99 %.  Medical Problem List and Plan: 1.Left side hemiparesis/dysarthriasecondary to rightthalamocapsularhemorrhage most likely related to hypertensive crisis as well as old left temporal parietal infarction with small vessel disease  Continue CIR  -WHO LUE, PRAFO nightly  Team conference held 7/27 2. Antithrombotics: -DVT/anticoagulation:Continue SCDs -antiplatelet therapy: N/A 3. Pain Management:Tylenol as needed  7/26- Will try Tramadol 50 mg BID prn if tylenol not enough- however, can't because has hx of seizures- will give norco prn  7/31- pain controlled- con't regimen prn 4. Mood:Zoloft 25 mg daily -antipsychotic agents: Geodon 40 mg nightly -mood seems for the most part well controlled 5. Neuropsych: This patientis notcapable of making decisions on hisown behalf. 6. Skin/Wound Care:Continue local care to hand, elevate LUE while in bed/chair 7. Fluids/Electrolytes/Nutrition:.  -protein supp 8. Post stroke dysphagia. Dysphagia #3 nectar thick liquids. Advance perspeech therapy. MBS on 7/29 shows markedly improved oropharyngeal swallow function.  9. History of  seizure disorder. Vimpat 200 mg twice daily, Keppra 1500 mg twice daily, Depakote 500 mg twice daily.  10. Hypertension. Coreg 3.125 mg twice daily.    7/31- BP well controlled con't regimen 11. PAF.  -No anticoagulationdue to history of left MCA infarction with hemorrhagic conversion 12. Hyperlipidemia. Resumed Lipitor   13. Obesity. BMI 30. Dietary follow-up 14. CAD with CABG 2019. No chest pain or shortness of breath. 15.Mild thrombocytopenia: Resolved 16. Neurogenic bladder:   - I/O cath prn, monitor PVR's, increased checks to every 6 horus  -OOB to void, voiding  -pt completed 5 days of macrodantin for 80k staph epi  Continue same, repeat UA negative, urine culture remains pending  7/26- No growth on Cx 17. Cognitive deficits: Continue SLP with focus on attention, initiation  7/31- improving somewhat in attention   LOS: 19 days A FACE TO FACE EVALUATION WAS PERFORMED  Klay Sobotka 08/14/2019, 1:36 PM

## 2019-08-14 NOTE — Progress Notes (Signed)
Physical Therapy Session Note  Patient Details  Name: [name] Cruz MRN: [id number] Date of Birth: 04/23/1955  Today's Date: 08/14/2019 PT Individual Time: 1500-1545 PT Individual Time Calculation (min): 45 min   Short Term Goals: Week 3:  PT Short Term Goal 1 (Week 3): Pt will propel w/c 50 ft consistently with min assist. PT Short Term Goal 2 (Week 3): Pt will demonstrate dynamic sitting balance x 3 minutes with no more than mod assist.  Skilled Therapeutic Interventions/Progress Updates:     Patient in w/c in the room upon PT arrival. Patient alert and agreeable to PT session. Patient denied pain during session. Patient verbalized excitement for discharge home, reports he is most excited to eat collard greens. Continues to present with pleasant flat affect throughout session. Focuses session on transfers training and motor control and midline orientation in standing during toileting throughout session.   Therapeutic Activity: Transfers: Patient performed blocked practice slide board transfers w/c<>bed x3 with mod A to the L and mod progressing to min-CGA on the R and total A on the L and mod A on the R for board placement. Provided cues for hand placement, board placement, initiation, sequencing, and head-hips relationship for proper technique and decreased assist with transfers.  Patient reported that he had had a BM during transfer training. Performed sit to/from stand using the [facility] with CGA-close supervision x4 using R UE to pull up. Provided cues for foot placement with increased time for patient to bring L foot forward x2 and brining hips forward to the bar of the Stedy L>R. Patient was incontinent of bowl and required total a for peri-care and LB dressing to doff soiled incontinence brief and shorts an donn clean ones. Focuses on standing balance with CGA-close supervision during peri-care with focus on L hip to bar to promote L hip/knee extension in standing and R shoulder to the wall  for improved midline orientation.   Patient in w/c in the room at end of session with breaks locked, seat belt alarm set, and all needs within reach.    Therapy Documentation Precautions:  Precautions Precautions: Fall Precaution Comments: L hemi, L inattention, pusher Restrictions Weight Bearing Restrictions: No   Therapy/Group: Individual Therapy  Rayna Brenner L Moyses Pavey PT, DPT  08/14/2019, 4:55 PM

## 2019-08-14 NOTE — Plan of Care (Signed)
  Problem: Consults Goal: RH GENERAL PATIENT EDUCATION Description: See Patient Education module for education specifics. Outcome: Progressing Goal: Skin Care Protocol Initiated - if Braden Score 18 or less Description: If consults are not indicated, leave blank or document N/A Outcome: Progressing Goal: Nutrition Consult-if indicated Outcome: Progressing   Problem: RH BOWEL ELIMINATION Goal: RH STG MANAGE BOWEL WITH ASSISTANCE Description: STG Manage Bowel with mod I Assistance. Outcome: Progressing Goal: RH STG MANAGE BOWEL W/MEDICATION W/ASSISTANCE Description: STG Manage Bowel with Medication with mod I Assistance. Outcome: Progressing   Problem: RH BLADDER ELIMINATION Goal: RH STG MANAGE BLADDER WITH ASSISTANCE Description: STG Manage Bladder With min Assistance Outcome: Progressing Goal: RH STG MANAGE BLADDER WITH EQUIPMENT WITH ASSISTANCE Description: STG Manage Bladder With Equipment With min Assistance Outcome: Progressing   Problem: RH SAFETY Goal: RH STG ADHERE TO SAFETY PRECAUTIONS W/ASSISTANCE/DEVICE Description: STG Adhere to Safety Precautions With cues and reminders Outcome: Progressing Goal: RH STG DECREASED RISK OF FALL WITH ASSISTANCE Description: STG Decreased Risk of Fall With cues and reminders  Outcome: Progressing   Problem: RH PAIN MANAGEMENT Goal: RH STG PAIN MANAGED AT OR BELOW PT'S PAIN GOAL Description: Less than 4 on scale of 0-10 Outcome: Progressing   Problem: Consults Goal: RH STROKE PATIENT EDUCATION Description: See Patient Education module for education specifics  Outcome: Progressing   

## 2019-08-15 ENCOUNTER — Inpatient Hospital Stay (HOSPITAL_COMMUNITY): Payer: Medicare Other | Admitting: Occupational Therapy

## 2019-08-15 NOTE — Progress Notes (Signed)
[place] PHYSICAL MEDICINE & REHABILITATION PROGRESS NOTE   Subjective/Complaints:  Pt reports no pain- doing "OK"_ no issues- wants to nap, so asked me to leave politely.    ROS:   Pt denies SOB, abd pain, CP, N/V/C/D, and vision changes   Objective:   No results found. No results for input(s): WBC, HGB, HCT, PLT in the last 72 hours. No results for input(s): NA, K, CL, CO2, GLUCOSE, BUN, CREATININE, CALCIUM in the last 72 hours.  Intake/Output Summary (Last 24 hours) at 08/15/2019 1153 Last data filed at 08/15/2019 0747 Gross per 24 hour  Intake 680 ml  Output 200 ml  Net 480 ml     Physical Exam: Vital Signs Blood pressure (!) 131/84, pulse 72, temperature 97.7 F (36.5 C), resp. rate 17, height 5\' 3"  (1.6 m), weight 78.3 kg, SpO2 98 %. General: sleepy, napping, appropriate behavior, NAD HEENT: conjugate gaze Neck: Supple without JVD or lymphadenopathy Heart: RRR Chest: CTA B/L- no W/R/R- good air movement Abdomen: Soft, NT, ND, (+)BS  Extremities: No clubbing, cyanosis, or edema. Pulses are 2+ Skin: Warm and dry.  Intact. Psych:vague but appropriate Musc: No edema in extremities.  No tenderness in extremities. Musc: No edema in extremities.  No tenderness in extremities. Neuro: Alert and oriented x1- a lot of word substitutions- no change Motor: LUE 0/5 proximal to distal, unchanged LLE: HF, KE 3/5, ADF 1+/5, some improvement.  Assessment/Plan: 1. Functional deficits secondary to right thalamocapsular hemorrhage which require 3+ hours per day of interdisciplinary therapy in a comprehensive inpatient rehab setting.  Physiatrist is providing close team supervision and 24 hour management of active medical problems listed below.  Physiatrist and rehab team continue to assess barriers to discharge/monitor patient progress toward functional and medical goals  Care Tool:  Bathing    Body parts bathed by patient: Chest, Abdomen, Front perineal area, Right upper  leg, Left upper leg, Face   Body parts bathed by helper: Buttocks, Right lower leg, Left lower leg, Right arm, Left arm     Bathing assist Assist Level: Maximal Assistance - Patient 24 - 49%     Upper Body Dressing/Undressing Upper body dressing Upper body dressing/undressing activity did not occur (including orthotics): Refused (asked. pt did not want to change shirt. ) What is the patient wearing?: Pull over shirt    Upper body assist Assist Level: Moderate Assistance - Patient 50 - 74%    Lower Body Dressing/Undressing Lower body dressing      What is the patient wearing?: Pants, Incontinence brief     Lower body assist Assist for lower body dressing: Maximal Assistance - Patient 25 - 49%     Toileting Toileting Toileting Activity did not occur (Clothing management and hygiene only): N/A (no void or bm)  Toileting assist Assist for toileting: Set up assist (Urinal)     Transfers Chair/bed transfer  Transfers assist  Chair/bed transfer activity did not occur: Safety/medical concerns  Chair/bed transfer assist level: Moderate Assistance - Patient 50 - 74% Chair/bed transfer assistive device: Sliding board   Locomotion Ambulation   Ambulation assist   Ambulation activity did not occur: Safety/medical concerns  Assist level: 2 helpers Assistive device: Hand held assist Max distance: 60 ft   Walk 10 feet activity   Assist  Walk 10 feet activity did not occur: Safety/medical concerns  Assist level: 2 helpers Assistive device: Hand held assist   Walk 50 feet activity   Assist Walk 50 feet with 2 turns activity did  not occur: Safety/medical concerns  Assist level: 2 helpers Assistive device: Hand held assist    Walk 150 feet activity   Assist Walk 150 feet activity did not occur: Safety/medical concerns         Walk 10 feet on uneven surface  activity   Assist Walk 10 feet on uneven surfaces activity did not occur: Safety/medical  concerns         Wheelchair     Assist Will patient use wheelchair at discharge?: Yes Type of Wheelchair: Manual Wheelchair activity did not occur: Safety/medical concerns  Wheelchair assist level: Moderate Assistance - Patient 50 - 74% Max wheelchair distance: 150    Wheelchair 50 feet with 2 turns activity    Assist    Wheelchair 50 feet with 2 turns activity did not occur: Safety/medical concerns   Assist Level: Moderate Assistance - Patient 50 - 74%   Wheelchair 150 feet activity     Assist  Wheelchair 150 feet activity did not occur: Safety/medical concerns   Assist Level: Moderate Assistance - Patient 50 - 74%   Blood pressure (!) 131/84, pulse 72, temperature 97.7 F (36.5 C), resp. rate 17, height 5\' 3"  (1.6 m), weight 78.3 kg, SpO2 98 %.  Medical Problem List and Plan: 1.Left side hemiparesis/dysarthriasecondary to rightthalamocapsularhemorrhage most likely related to hypertensive crisis as well as old left temporal parietal infarction with small vessel disease  Continue CIR  -WHO LUE, PRAFO nightly  Team conference held 7/27 2. Antithrombotics: -DVT/anticoagulation:Continue SCDs -antiplatelet therapy: N/A 3. Pain Management:Tylenol as needed  7/26- Will try Tramadol 50 mg BID prn if tylenol not enough- however, can't because has hx of seizures- will give norco prn  8/1- tylenol prn- con't regimen 4. Mood:Zoloft 25 mg daily -antipsychotic agents: Geodon 40 mg nightly -mood seems for the most part well controlled 5. Neuropsych: This patientis notcapable of making decisions on hisown behalf. 6. Skin/Wound Care:Continue local care to hand, elevate LUE while in bed/chair 7. Fluids/Electrolytes/Nutrition:.  -protein supp 8. Post stroke dysphagia. Dysphagia #3 nectar thick liquids. Advance perspeech therapy. MBS on 7/29 shows markedly improved oropharyngeal swallow function.  9. History of  seizure disorder. Vimpat 200 mg twice daily, Keppra 1500 mg twice daily, Depakote 500 mg twice daily.  10. Hypertension. Coreg 3.125 mg twice daily.    8/1- BP 130s/80- con't regimen 11. PAF.  -No anticoagulationdue to history of left MCA infarction with hemorrhagic conversion 12. Hyperlipidemia. Resumed Lipitor   13. Obesity. BMI 30. Dietary follow-up 14. CAD with CABG 2019. No chest pain or shortness of breath. 15.Mild thrombocytopenia: Resolved 16. Neurogenic bladder:   - I/O cath prn, monitor PVR's, increased checks to every 6 horus  -OOB to void, voiding  -pt completed 5 days of macrodantin for 80k staph epi  Continue same, repeat UA negative, urine culture remains pending  7/26- No growth on Cx 17. Cognitive deficits: Continue SLP with focus on attention, initiation  7/31- improving somewhat in attention   LOS: 20 days A FACE TO FACE EVALUATION WAS PERFORMED  [name] Cruz 08/15/2019, 11:53 AM

## 2019-08-15 NOTE — Progress Notes (Signed)
Occupational Therapy Session Note  Patient Details  Name: [name] Cruz MRN: [id number] Date of Birth: 1955-06-18  Today's Date: 08/15/2019 OT Individual Time: 9518-8416 OT Individual Time Calculation (min): 28 min   Short Term Goals: Week 3:  OT Short Term Goal 1 (Week 3): Pt will demonstrate self ROM with min verbal cues OT Short Term Goal 2 (Week 3): Pt will complete toilet transfer with max A of 1 OT Short Term Goal 3 (Week 3): Pt will tolerate sitting unsupported for 10 minutes with no more than min A for static balance  Skilled Therapeutic Interventions/Progress Updates:    Pt greeted in bed with family present. He declined toileting. Therefore tx focus was placed on NMR of the Lt UE. Guided pt through passive and active assist exercises of the affected arm, note that pt was particularly tight in forearm supinators, shoulder external rotators, + wrist and digit extensors. Stretching completed to offset flexor synergies. Active assist ROM elbow flexion/extension, shoulder internal rotation, and shoulder adduction with gravity minimized. Note that he is able to lift his arm against gravity a little bit as well. Sister was present and very surprised/pleased to see active movement in his affected UE. At end of session pt remained in bed, OT propped his Lt UE on pillows for limb protection and edema mgt. He then reported he voided bowels in his brief, NT notified.   Therapy Documentation Precautions:  Precautions Precautions: Fall Precaution Comments: L hemi, L inattention, pusher Restrictions Weight Bearing Restrictions: No Vital Signs: Therapy Vitals Temp: 97.8 F (36.6 C) Temp Source: Oral Pulse Rate: 76 Resp: 18 BP: 106/72 Patient Position (if appropriate): Lying Oxygen Therapy SpO2: 100 % O2 Device: Room Air ADL: ADL Eating: Minimal assistance Grooming: Maximal assistance Upper Body Bathing: Maximal assistance Lower Body Bathing: Maximal assistance Upper Body Dressing:  Moderate assistance Lower Body Dressing: Maximal assistance Toileting: Unable to assess Toilet Transfer: Maximal assistance (+2 using Stedy)      Therapy/Group: Individual Therapy  Hae Ahlers A Carmellia Kreisler 08/15/2019, 4:20 PM

## 2019-08-16 ENCOUNTER — Inpatient Hospital Stay (HOSPITAL_COMMUNITY): Payer: Medicare Other | Admitting: Physical Therapy

## 2019-08-16 ENCOUNTER — Inpatient Hospital Stay (HOSPITAL_COMMUNITY): Payer: Medicare Other | Admitting: Speech Pathology

## 2019-08-16 ENCOUNTER — Inpatient Hospital Stay (HOSPITAL_COMMUNITY): Payer: Medicare Other

## 2019-08-16 ENCOUNTER — Ambulatory Visit: Payer: Medicare Other | Admitting: Adult Health

## 2019-08-16 ENCOUNTER — Inpatient Hospital Stay (HOSPITAL_COMMUNITY): Payer: Medicare Other | Admitting: Occupational Therapy

## 2019-08-16 LAB — SARS CORONAVIRUS 2 (TAT 6-24 HRS): SARS Coronavirus 2: NEGATIVE

## 2019-08-16 MED ORDER — SENNOSIDES-DOCUSATE SODIUM 8.6-50 MG PO TABS: 2.0000 | ORAL_TABLET | Freq: Two times a day (BID) | ORAL | Status: DC

## 2019-08-16 MED ORDER — VIMPAT 100 MG PO TABS: ORAL_TABLET | ORAL | 0 refills | Status: DC

## 2019-08-16 MED ORDER — TAMSULOSIN HCL 0.4 MG PO CAPS: 0.4000 mg | ORAL_CAPSULE | Freq: Every day | ORAL | Status: DC

## 2019-08-16 MED ORDER — ATORVASTATIN CALCIUM 80 MG PO TABS: 80.0000 mg | ORAL_TABLET | Freq: Every day | ORAL | Status: DC

## 2019-08-16 MED ORDER — ACETAMINOPHEN 325 MG PO TABS: 650.0000 mg | ORAL_TABLET | ORAL | Status: DC | PRN

## 2019-08-16 NOTE — Progress Notes (Signed)
Occupational Therapy Weekly Progress Note  Patient Details  Name: [name] Cruz MRN: [id number] Date of Birth: 12-Dec-1955  Beginning of progress report period: July 27, 2019 End of progress report period: August 16, 2019  Today's Date: 08/16/2019 OT Individual Time: 3235-5732 OT Individual Time Calculation (min): 75 min    Patient has met 2 of 3 short term goals.  Pt is making progress towards OT goals. Pt has demonstrated much improved attention to L side within functional tasks. He has also demonstrated some carryover for self-ROM of L UE. Pt's sitting balance has improved, but he has lateral lean and intermittent LOB with increased task demand of functional tasks-requiring mod A to maintain sitting balance at times. Pt's transfers are improving and have introduced slideboard which pt has done well with. Pt will benefit from continued OT at SNF to increase independence with BADL tasks and reduce caregiver burden on siblings. Continue current POC.  Patient continues to demonstrate the following deficits: muscle weakness, decreased cardiorespiratoy endurance, impaired timing and sequencing, abnormal tone, unbalanced muscle activation, motor apraxia, ataxia, decreased coordination and decreased motor planning, decreased midline orientation, decreased attention to left, left side neglect and decreased motor planning, decreased initiation, decreased attention, decreased awareness, decreased problem solving, decreased safety awareness, decreased memory and delayed processing and decreased sitting balance, decreased standing balance, decreased postural control, hemiplegia and decreased balance strategies and therefore will continue to benefit from skilled OT intervention to enhance overall performance with BADL and Reduce care partner burden.  Patient progressing toward long term goals..  Continue plan of care.  OT Short Term Goals Week 3:  OT Short Term Goal 1 (Week 3): Pt will demonstrate self ROM  with min verbal cues OT Short Term Goal 1 - Progress (Week 3): Met OT Short Term Goal 2 (Week 3): Pt will complete toilet transfer with max A of 1 OT Short Term Goal 2 - Progress (Week 3): Met OT Short Term Goal 3 (Week 3): Pt will tolerate sitting unsupported for 10 minutes with no more than min A for static balance OT Short Term Goal 3 - Progress (Week 3): Partly met Week 4:  OT Short Term Goal 1 (Week 4): LTG=STG 2/2 ELOS  Skilled Therapeutic Interventions/Progress Updates:    Pt greeted asleep in bed, easy to wake and agreeable to OT treatment session. Pt noted to have been incontinent of urine. Worked on rolling L and R to change brief and clean peri-area. PT able to assist with cleaning peri-area when OT handed pt wash cloth. Pt then came to sitting EOB with increased time and max A. Worked on sitting balance at EOB with pt able to achieve static sitting at midline with min A. Pt with lateral LOB to the L with any dynamic task or increased task demand. Worked on LB dressing and weight shift to the L with trying to thread R LE into pants. Pt needed increased time to process and motor plan, but evventually was able to thread R LE with min A. Sit<>stand from EOB to pull up pants with mod A of 1. Pt then pivoted over to wc on stronger R side with mod/max A. Pt brought to the sink for UB bathing/dressing. Pt given more than reasonable amount of time for all tasks 2/2 slow processing. OT had pt reach for items to the L to help correct balance and used mirror feedback for midline orientation. Worked on L attention with visual scanning to locate items on L side of body. L  UE NMR with joint input through wrist and elbow to bring shoulder and elbow through full ROM. Pt with increased flexor tone throughout L UE. OT placed SAEBO e-stim on wrist extensors. SAEBO left on for 60 minutes. OT returned to remove SAEBO with skin intact and no adverse reactions.  [place] Stim One 330 pulse width 35 Hz pulse rate On 8  sec/ off 8 sec Ramp up/ down 2 sec Symmetrical Biphasic wave form  Max intensity 18m at 500 Ohm load  Pt left seated in wc with chair alarm on, call bell in reach, and needs met.    Therapy Documentation Precautions:  Precautions Precautions: Fall Precaution Comments: L hemi, L inattention, pusher Restrictions Weight Bearing Restrictions: No Pain: Pain Assessment Pain Scale: Faces Pain Score: 0-No pain Faces Pain Scale: No hurt ADL: ADL Eating: Minimal assistance Grooming: Maximal assistance Upper Body Bathing: Maximal assistance Lower Body Bathing: Maximal assistance Upper Body Dressing: Moderate assistance Lower Body Dressing: Maximal assistance Toileting: Unable to assess Toilet Transfer: Maximal assistance (+2 using Stedy)   Therapy/Group: Individual Therapy  [name]8/02/2019, 10:22 AM

## 2019-08-16 NOTE — Discharge Summary (Signed)
Physician Discharge Summary  Patient ID: HAILEY MILES MRN: [id number] DOB/AGE: 1955/10/29 64 y.o.  Admit date: 07/26/2019 Discharge date: 08/17/2019  Discharge Diagnoses:  Principal Problem:   Thalamic hemorrhage (HCC) Active Problems:   SAH (subarachnoid hemorrhage) (HCC)   Neurogenic bladder   Benign essential HTN   Labile blood pressure   Hemiparesis affecting left side as late effect of stroke [facility])   Discharged Condition: stable   Significant Diagnostic Studies: DG Chest Port 1 View  Result Date: 07/24/2019 CLINICAL DATA:  Short of breath, hypertension EXAM: PORTABLE CHEST 1 VIEW COMPARISON:  07/23/2019 FINDINGS: Single frontal view of the chest demonstrates a stable cardiac silhouette. No airspace disease, effusion, or pneumothorax. Median sternotomy wires unchanged. IMPRESSION: 1. Stable exam, no acute process. Electronically Signed   By: Sharlet Salina M.D.   On: 07/24/2019 18:15   DG Swallowing Func-Speech Pathology  Result Date: 08/12/2019 Objective Swallowing Evaluation: Type of Study: MBS-Modified Barium Swallow Study  Patient Details Name: LORENSO QUIRINO MRN: [id number] Date of Birth: [birth year] Today's Date: 08/12/2019 Past Medical History: Past Medical History: Diagnosis Date . Acute deep vein thrombosis (DVT) of left upper extremity (HCC)   L brachial and basilic veins, eliquis started 08/22/2017 to continue for 3 months total  . Cognitive impairment 2007  after stroke, saw rehab but told to stop because was too upsetting to him . History of chicken pox  . HLD (hyperlipidemia)  . HTN (hypertension)  . NSTEMI (non-ST elevated myocardial infarction) (HCC) 02/21/2017 . Obesity  . Stroke, hemorrhagic (HCC) 2007  thought 2/2 HTN (240sbp); residual cognitive impairment, loss of R peripheral field, no driving Past Surgical History: Past Surgical History: Procedure Laterality Date . ANKLE SURGERY  1990s  right foot with plate and screws . CORONARY ARTERY BYPASS GRAFT N/A 02/24/2017  3v  Procedure: CORONARY ARTERY BYPASS GRAFTING (CABG) x 3 ON PUMP USING LEFT INTERNAL MAMMARY ARTERY TO LEFT ANTERIOR DESENDING CORNARY ARTERY, RIGHT GREATER SAPHENOUS VEIN TO LEFT CIRCUMFLEX ARTERY AND POSTERIOR DESENDING ARTERY. RIGHT GREATER SAPHENOUS VEIN OBTAINED VIA ENDOVEIN HARVEST.;  Surgeon: Delight Ovens, MD . IABP INSERTION N/A 02/21/2017  Procedure: IABP Insertion;  Surgeon: Yvonne Kendall, MD;  Location: [facility] INVASIVE CV LAB;  Service: Cardiovascular;  Laterality: N/A; . LEFT HEART CATH AND CORONARY ANGIOGRAPHY N/A 02/21/2017  Procedure: LEFT HEART CATH AND CORONARY ANGIOGRAPHY;  Surgeon: Yvonne Kendall, MD;  Location: [facility] INVASIVE CV LAB;  Service: Cardiovascular;  Laterality: N/A; . TEE WITHOUT CARDIOVERSION N/A 02/24/2017  Procedure: TRANSESOPHAGEAL ECHOCARDIOGRAM (TEE);  Surgeon: Delight Ovens, MD;  Location: [facility] OR;  Service: Open Heart Surgery;  Laterality: N/A; HPI: Pt is a 64 yo male presenting with sudden onset L sided weakness. CT showed R thalamic hemorrhage. PMH: DVT, hemorrhagic stroke with residual cognitive-linguistic impairment, HTN, HLD, seizures, NSTEMI  Subjective: alert, cooperative, limited historian Assessment / Plan / Recommendation CHL IP CLINICAL IMPRESSIONS 08/12/2019 Clinical Impression Pt demonstrates markedly improved oropharyngeal swallow function today in comparison to last MBSS conducted 07/21/19. Swallow initiation is more timely, occurring more consistently at the level of the vallecula and only intermittently in the pyriforms, but no pooling was noted. He also demonstrates improved oral control of boluses regardless of size, as premature spillage only noted X1 and no airway intrusion occurred during that instance. A trace amount of thin barium penetrated into the laryngeal vestibule X2, however it remained above the level of the vocal folds and was completely ejected during the swallow, or with a sensed volitional dry swallow (PAS score 2).  Although piecemeal swallowing  noted when pt consumed barium pill with thin via straw, no penetration or aspiration observed. Recommend pt continue dysphagia 3 solid textures, upgrade to thin liquids using small sips, slow rate, straw are okay, and intermittently cue pt for dry swallows during liquid intake. Medications may be administered 1 at a time with thin or in purees. Please provide full supervision during meals to ensure safest intake and adherence to swallow precautions. SLP Visit Diagnosis Dysphagia, oropharyngeal phase (R13.12) Attention and concentration deficit following -- Frontal lobe and executive function deficit following -- Impact on safety and function Mild aspiration risk   CHL IP TREATMENT RECOMMENDATION 07/21/2019 Treatment Recommendations Therapy as outlined in treatment plan below   Prognosis 07/21/2019 Prognosis for Safe Diet Advancement Good Barriers to Reach Goals Cognitive deficits;Language deficits Barriers/Prognosis Comment -- CHL IP DIET RECOMMENDATION 08/12/2019 SLP Diet Recommendations Dysphagia 3 (Mech soft) solids;Thin liquid Liquid Administration via Straw;Cup Medication Administration Whole meds with liquid Compensations Slow rate;Small sips/bites;Monitor for anterior loss;Minimize environmental distractions Postural Changes Seated upright at 90 degrees   CHL IP OTHER RECOMMENDATIONS 08/12/2019 Recommended Consults -- Oral Care Recommendations Oral care BID Other Recommendations --   CHL IP FOLLOW UP RECOMMENDATIONS 07/26/2019 Follow up Recommendations Inpatient Rehab   CHL IP FREQUENCY AND DURATION 07/21/2019 Speech Therapy Frequency (ACUTE ONLY) min 2x/week Treatment Duration 2 weeks      CHL IP ORAL PHASE 08/12/2019 Oral Phase Impaired Oral - Pudding Teaspoon -- Oral - Pudding Cup -- Oral - Honey Teaspoon -- Oral - Honey Cup -- Oral - Nectar Teaspoon -- Oral - Nectar Cup NT Oral - Nectar Straw NT Oral - Thin Teaspoon -- Oral - Thin Cup Decreased bolus cohesion Oral - Thin Straw Decreased bolus cohesion;Premature  spillage Oral - Puree NT Oral - Mech Soft NT Oral - Regular -- Oral - Multi-Consistency -- Oral - Pill Lingual pumping;Delayed oral transit;Decreased bolus cohesion;Piecemeal swallowing;Reduced posterior propulsion Oral Phase - Comment --  CHL IP PHARYNGEAL PHASE 08/12/2019 Pharyngeal Phase Impaired Pharyngeal- Pudding Teaspoon -- Pharyngeal -- Pharyngeal- Pudding Cup -- Pharyngeal -- Pharyngeal- Honey Teaspoon -- Pharyngeal -- Pharyngeal- Honey Cup -- Pharyngeal -- Pharyngeal- Nectar Teaspoon -- Pharyngeal -- Pharyngeal- Nectar Cup NT Pharyngeal -- Pharyngeal- Nectar Straw NT Pharyngeal -- Pharyngeal- Thin Teaspoon -- Pharyngeal -- Pharyngeal- Thin Cup Delayed swallow initiation-vallecula;Delayed swallow initiation-pyriform sinuses;Reduced airway/laryngeal closure;Penetration/Aspiration during swallow;Pharyngeal residue - pyriform;Pharyngeal residue - valleculae Pharyngeal Material enters airway, remains ABOVE vocal cords then ejected out Pharyngeal- Thin Straw Delayed swallow initiation-vallecula;Reduced airway/laryngeal closure;Penetration/Aspiration during swallow Pharyngeal Material enters airway, remains ABOVE vocal cords then ejected out Pharyngeal- Puree NT Pharyngeal -- Pharyngeal- Mechanical Soft NT Pharyngeal -- Pharyngeal- Regular -- Pharyngeal -- Pharyngeal- Multi-consistency -- Pharyngeal -- Pharyngeal- Pill -- Pharyngeal -- Pharyngeal Comment --  CHL IP CERVICAL ESOPHAGEAL PHASE 08/12/2019 Cervical Esophageal Phase WFL Pudding Teaspoon -- Pudding Cup -- Honey Teaspoon -- Honey Cup -- Nectar Teaspoon -- Nectar Cup -- Nectar Straw -- Thin Teaspoon -- Thin Cup -- Thin Straw -- Puree -- Mechanical Soft -- Regular -- Multi-consistency -- Pill -- Cervical Esophageal Comment -- Little Ishikawa 08/12/2019, 9:48 AM                        Labs:  Basic Metabolic Panel: BMP Latest Ref Rng & Units 08/02/2019 07/27/2019 07/26/2019  Glucose 70 - 99 mg/dL 623(J) 94 96  BUN 8 - 23 mg/dL 11 13 11   Creatinine 0.61 - 1.24  mg/dL ) 6.28(B 1.51  BUN/Creat Ratio 10 - 24 - - -  Sodium 135 - 145 mmol/L 138 134(L) 136  Potassium 3.5 - 5.1 mmol/L 3.8 4.1 4.0  Chloride 98 - 111 mmol/L 100 98 100  CO2 22 - 32 mmol/L 27 27 28   Calcium 8.9 - 10.3 mg/dL 8.9 ) 0.9(X)    CBC: CBC Latest Ref Rng & Units 08/02/2019 07/27/2019 07/26/2019  WBC 4.0 - 10.5 K/uL 9.9 6.1 6.0  Hemoglobin 13.0 - 17.0 g/dL 12.5(L) 12.2(L) 11.8(L)  Hematocrit 39 - 52 % 38.2(L) 36.4(L) 35.4(L)  Platelets 150 - 400 K/uL 314 161 142(L)    CBG: Recent Labs  Lab 08/13/19 2115  GLUCAP 106*    Brief HPI:   JOAOVICTOR KRONE is a 64 y.o. male with history of bipolar disorder, CAD s/p CABG, HTN, PAF, hemorrhagic stroke (Eliquis d/c), seizure disorder who was admitted on 07/19/2019 with left-sided weakness and slurred speech.  Blood pressure elevated with SBP in 240's and he was started on Cleviprex for BP control.  CT head showed parenchymal hemorrhage centered in right thalamic capsular junction and chronic left temporoparietal infarct.  2D echo showed EF of 60%.  Follow-up MRI showed no change in size of bleed.  UDS negative.  Therapy evaluations completed revealing cognitive deficits with decreased visual perceptual skills, decreased awareness of deficits, decreased balance and left-sided weakness affecting overall mobility and ADLs.  CIR was recommended due to functional decline.   Hospital Course: TORREY BALLINAS was admitted to rehab 07/26/2019 for inpatient therapies to consist of PT, ST and OT at least three hours five days a week. Past admission physiatrist, therapy team and rehab RN have worked together to provide customized collaborative inpatient rehab.  He was maintained on dysphagia 3 with nectar liquids initiated.  Oral thrush was treated with Diflucan x2.  Blood pressures and heart rate were monitored on 3 times daily basis and have been stable on Coreg twice daily.  Lipitor was resumed due to hyperlipidemia.  Mood has been stable on Geodon  and Zoloft.  He has been seizure-free during his stay. Serial BMET showed that hyponatremia has resolved and renal status is stable. Follow-up CBC shows H&H is stable and thrombocytopenia has resolved.    UA/UCS was ordered due to difficulty voiding.  He was found to have staph epi UTI and was treated with 5-day course of Macrodantin.  Voiding function has improved without signs of retention.  Diet has been advanced to dysphagia 3, thin liquids with full supervision for safety.  He is incontinent of bowel and bladder. MASD due to incontinence has resolved with use of barrier cream. Senna S was changed to prn every 48 hrs if no BM. He is showing some improvement in left inattention, ability to recall and carryover. However he continues to have cognitive and physical deficits affecting mobility and ADLs.  Due to lack of social support, his family has elected on SNF for further therapies.  He was discharged to on 08/17/2019    Rehab course: During patient's stay in rehab weekly team conferences were held to monitor patient's progress, set goals and discuss barriers to discharge. At admission, patient required max 1-2+ assist with mobility and max assist with basic self-care tasks. He exhibited mild cognitive deficits as well as mild dysarthria but speech was 80% intelligible at sentence level.  He had moderate oral motor weakness with dysphagia requiring modified diet. He  has had improvement in activity tolerance, balance, postural control as well as ability to compensate for  deficits.  He requires mod to max assist for ADL tasks.  He requires mod assist for transfers with use of sliding board.   He continues to have word finding deficits and requires mod verbal and visual cues to follow one-step commands with functional tasks.  Speech is perseverative and tangential.   Disposition: Skilled nursing facility  Diet: Dysphagia 3, thin liquids.  Full supervision with cues to slow rate and take small bites/small  sips.   Special Instructions: 1.  Administer medications with pure.  2.  Toilet patient every 4 hours while awake.    Discharge Instructions    Ambulatory referral to Neurology   Complete by: As directed    An appointment is requested in approximately 4 weeks right thalamocapsular hemorrhage   Ambulatory referral to Physical Medicine Rehab   Complete by: As directed    4-6 weeks follow up appointment     Allergies as of 08/17/2019      Reactions   Losartan Other (See Comments)   hyperkalemia      Medication List    STOP taking these medications   B-12 1000 MCG Subl   senna-docusate 8.6-50 MG tablet Commonly known as: Senokot-S     TAKE these medications   acetaminophen 325 MG tablet Commonly known as: TYLENOL Take 2 tablets (650 mg total) by mouth every 4 (four) hours as needed for mild pain.   atorvastatin 80 MG tablet Commonly known as: LIPITOR Take 1 tablet (80 mg total) by mouth daily.   divalproex 500 MG DR tablet Commonly known as: Depakote Take 1 tablet (500 mg total) by mouth 2 (two) times daily.   levETIRAcetam 750 MG tablet Commonly known as: KEPPRA Take 2 tablets (1,500 mg total) by mouth 2 (two) times daily.   multivitamin with minerals Tabs tablet Take 1 tablet by mouth daily.   pantoprazole 40 MG tablet Commonly known as: PROTONIX Take 1 tablet (40 mg total) by mouth at bedtime.   protective barrier Crea Apply 1 application topically 2 (two) times daily. And prn after pericare to sacrum/buttocks.   sertraline 25 MG tablet Commonly known as: ZOLOFT TAKE 1 TABLET BY MOUTH EVERY DAY   tamsulosin 0.4 MG Caps capsule Commonly known as: FLOMAX Take 1 capsule (0.4 mg total) by mouth daily after supper.   Vimpat 100 MG Tabs Generic drug: Lacosamide Take 200 mg at 8 am and 200 mg at 10 pm daily. What changed:   how much to take  how to take this  when to take this  additional instructions   ziprasidone 40 MG capsule Commonly known  as: GEODON TAKE 1 CAPSULE (40 MG TOTAL) BY MOUTH AT BEDTIME.       Follow-up Information    Ranelle OysterSwartz, Zachary T, MD Follow up.   Specialty: Physical Medicine and Rehabilitation Why: Office to call for appointment Contact information: [address] [place] [address] [phone]               Signed: Jacquelynn Creeamela S Aarushi Hemric 08/17/2019, 9:11 AM

## 2019-08-16 NOTE — Progress Notes (Signed)
Patient ID: [name] Cruz, male   DOB: [birth year], 64 y.o.   MRN: [id number]  SW received message from [facility] (7/30 7:48pm) reporting pt was appeal was approved. Auth [id number]. Authorization is good for 5 days. Next clinical review due on 8/3 if pt does not d/c to SNF.   SW left message for pt sister [name] Cruz  [phone]) to inform on above, and will follow-up on when pt will d/c.  Per medical team, pt can d/c tomorrow. SW informed Olegario Messier Campbell/liasion on admission to d/c tomorrow. COVID test requested.   SW spoke with pt sister [name] Cruz to discuss above. SW informed pt will be ambulance transport to facility tomorrow.   SW scheduled ambulance transport with [facility]; pick up at 10am.   Cecile Sheerer, MSW, LCSWA Office: [phone] Cell: [phone] Fax: [phone]

## 2019-08-16 NOTE — Progress Notes (Signed)
Speech Language Pathology Daily Session Note  Patient Details  Name: [name] Cruz MRN: [id number] Date of Birth: 1956-01-11  Today's Date: 08/16/2019 SLP Individual Time: 5400-8676 SLP Individual Time Calculation (min): 45 min  Short Term Goals: Week 3: SLP Short Term Goal 1 (Week 3): Pt will participate in instrumental swallow assessment to determine least restrictive diet. SLP Short Term Goal 2 (Week 3): Patient will utilize external aids for recall of time and place with Mod A verbal and visual cues. SLP Short Term Goal 3 (Week 3): Patient will follow 1-step commands with 75% accuracy during functional tasks with Min A verbal and visual cues. SLP Short Term Goal 4 (Week 3): Patient will demonstrate basic problem solving for functional and familiar tasks with Mod A verbal and visual cues. SLP Short Term Goal 5 (Week 3): Patient will participate in structured language tasks and improve word-finding/naming to 75% accuracy and Min A multimodal cues. SLP Short Term Goal 6 (Week 3): Patient will verbalize wants/needs at the sentence level with Min A multimodal cues for word-finidng.  Skilled Therapeutic Interventions:  Session focused on addressing language goals for naming of objects, object photos, object drawings. Patient named 3/10 objects without cues, 8/10 with phonemic cues, described object function for 8/10. He named object line drawings on 6/10 without cues and named object photos on 4/10 without cues. He seems to perform better when he is holding object or picture/photo in his hand. At end of session, patient spontaneously requested "let the bed down". He required verbal and gestural cues to search for and use wall calendar to orient to month, year.  Pain Pain Assessment Pain Scale: Faces Pain Score: 0-No pain Faces Pain Scale: No hurt  Therapy/Group: Individual Therapy   Angela Nevin, MA, CCC-SLP Speech Therapy

## 2019-08-16 NOTE — Progress Notes (Signed)
Occupational Therapy Session Note  Patient Details  Name: EFTON THOMLEY MRN: [id number] Date of Birth: [birth year]  Today's Date: 08/16/2019 OT Individual Time: 1403-1430 OT Individual Time Calculation (min): 27 min    Short Term Goals: Week 3:  OT Short Term Goal 1 (Week 3): Pt will demonstrate self ROM with min verbal cues OT Short Term Goal 2 (Week 3): Pt will complete toilet transfer with max A of 1 OT Short Term Goal 3 (Week 3): Pt will tolerate sitting unsupported for 10 minutes with no more than min A for static balance  Skilled Therapeutic Interventions/Progress Updates:    Pt received in w/c with no c/o pain. Pt agreeable to OT session. Pt not oriented to anything but self. Pt completed sit > stand at the sink with mod A. Pt required mod-max A standing balance assist with L UE blocked in weightbearing position while pt brushed teeth. HOH facilitation provided for pt's LUE to grasp and dispense toothpaste. Pt required facilitation for weight shift forward to spit. Pt returned to seated and was able to locate comb with only 1 cue and brush hair. Pt shaved with electric razor with set up assist. Pt was then guided through LUE PROM with guided muscle activation as well. Pt with trace activation in the bicep, 2/5 tricep and 2+/5 scapular protraction. No pain reported. Pt left sitting up with the chair alarm belt fastened and all needs met. Call bell clipped to shirt.   Therapy Documentation Precautions:  Precautions Precautions: Fall Precaution Comments: L hemi, L inattention, pusher Restrictions Weight Bearing Restrictions: No   Therapy/Group: Individual Therapy  Curtis Sites 08/16/2019, 6:39 AM

## 2019-08-16 NOTE — Progress Notes (Signed)
Occupational Therapy Discharge Summary  Patient Details  Name: [name] Cruz MRN: [id number] Date of Birth: Jan 04, 1956  Patient has met 10 of 10 long term goals due to improved activity tolerance, improved balance, postural control, ability to compensate for deficits, functional use of  LEFT upper and LEFT lower extremity, improved attention, improved awareness and improved coordination.  Patient to discharge at overall Mod/Max Assist level.  Patient's care partner unavailable to provide the necessary physical and cognitive assistance at discharge, therefore, pt to dc to SNF for continued OT services in next venue of care.    Reasons goals not met: n/a  Recommendation:  Patient will benefit from ongoing skilled OT services in skilled nursing facility setting to continue to advance functional skills in the area of BADL and Reduce care partner burden.  Equipment: No equipment provided  Reasons for discharge: treatment goals met and discharge from hospital  Patient/family agrees with progress made and goals achieved: Yes  OT Discharge Precautions/Restrictions  Precautions Precautions: Fall Precaution Comments: L hemi, L inattention, pusher Restrictions Weight Bearing Restrictions: No Pain Pain Assessment Pain Score: Asleep ADL ADL Eating: Set up Grooming: Setup Upper Body Bathing: Minimal assistance Lower Body Bathing: Moderate assistance Upper Body Dressing: Moderate assistance Lower Body Dressing: Maximal assistance Toileting: Maximal assistance Toilet Transfer: Moderate assistance Perception  Perception: Impaired Inattention/Neglect: Does not attend to left side of body (improved since eval) Praxis Praxis: Impaired Praxis Impairment Details: Ideation;Initiation;Motor planning;Perseveration Praxis-Other Comments: Improved since eval Cognition Overall Cognitive Status: Impaired/Different from baseline Arousal/Alertness: Awake/alert Orientation Level: Oriented  X4 Memory: Impaired Awareness: Impaired Problem Solving: Impaired Safety/Judgment: Appears intact Sensation Sensation Light Touch Impaired Details: Impaired LUE;Impaired LLE Proprioception Impaired Details: Impaired LLE;Impaired LUE Coordination Gross Motor Movements are Fluid and Coordinated: No Fine Motor Movements are Fluid and Coordinated: No Coordination and Movement Description: trace movement in L UE, decrease smoothness and accuracy with movements 2/2 apraxia, improved since eval Motor  Motor Motor: Abnormal postural alignment and control Motor - Discharge Observations: generalized weakness, L hemiplegia Mobility  Bed Mobility Bed Mobility: Rolling Right;Rolling Left;Left Sidelying to Sit;Sit to Supine Rolling Right: Moderate Assistance - Patient 50-74% Rolling Left: Minimal Assistance - Patient > 75% Supine to Sit: Moderate Assistance - Patient 50-74% Sit to Supine: Moderate Assistance - Patient 50-74% Transfers Sit to Stand: Moderate Assistance - Patient 50-74% Stand to Sit: Moderate Assistance - Patient 50-74%  Trunk/Postural Assessment  Postural Control Trunk Control: LOB to L Righting Reactions: impaired Protective Responses: impaired  Balance Static Sitting Balance Static Sitting - Balance Support: Feet supported;Right upper extremity supported Static Sitting - Level of Assistance: 4: Min assist Dynamic Sitting Balance Dynamic Sitting - Balance Support: Feet supported Dynamic Sitting - Level of Assistance: 3: Mod assist Static Standing Balance Static Standing - Level of Assistance: 3: Mod assist Dynamic Standing Balance Dynamic Standing - Level of Assistance: 3: Mod assist;2: Max assist Extremity/Trunk Assessment RUE Assessment RUE Assessment: Within Functional Limits LUE Assessment LUE Assessment: Exceptions to [facility] LUE Body System: Neuro Brunstrum levels for arm and hand: Arm;Hand Brunstrum level for arm: Stage II Synergy is developing Brunstrum level  for hand: Stage I Flaccidity LUE Strength Left Shoulder Flexion: 2-/5 Left Elbow Flexion: 2-/5 LUE Tone LUE Tone: Mild   Daneen Schick Gomer [place] 08/16/2019, 9:51 PM

## 2019-08-16 NOTE — Plan of Care (Signed)
  Problem: RH Balance Goal: LTG: Patient will maintain dynamic sitting balance (OT) Description: LTG:  Patient will maintain dynamic sitting balance with assistance during activities of daily living (OT) Outcome: Completed/Met Goal: LTG Patient will maintain dynamic standing with ADLs (OT) Description: LTG:  Patient will maintain dynamic standing balance with assist during activities of daily living (OT)  Outcome: Completed/Met   Problem: Sit to Stand Goal: LTG:  Patient will perform sit to stand in prep for activites of daily living with assistance level (OT) Description: LTG:  Patient will perform sit to stand in prep for activites of daily living with assistance level (OT) Outcome: Completed/Met   Problem: RH Eating Goal: LTG Patient will perform eating w/assist, cues/equip (OT) Description: LTG: Patient will perform eating with assist, with/without cues using equipment (OT) Outcome: Completed/Met   Problem: RH Grooming Goal: LTG Patient will perform grooming w/assist,cues/equip (OT) Description: LTG: Patient will perform grooming with assist, with/without cues using equipment (OT) Outcome: Completed/Met   Problem: RH Bathing Goal: LTG Patient will bathe all body parts with assist levels (OT) Description: LTG: Patient will bathe all body parts with assist levels (OT) Outcome: Completed/Met   Problem: RH Dressing Goal: LTG Patient will perform upper body dressing (OT) Description: LTG Patient will perform upper body dressing with assist, with/without cues (OT). Outcome: Completed/Met Goal: LTG Patient will perform lower body dressing w/assist (OT) Description: LTG: Patient will perform lower body dressing with assist, with/without cues in positioning using equipment (OT) Outcome: Completed/Met   Problem: RH Functional Use of Upper Extremity Goal: LTG Patient will use RT/LT upper extremity as a (OT) Description: LTG: Patient will use right/left upper extremity as a  stabilizer/gross assist/diminished/nondominant/dominant level with assist, with/without cues during functional activity (OT) Outcome: Completed/Met   Problem: RH Toilet Transfers Goal: LTG Patient will perform toilet transfers w/assist (OT) Description: LTG: Patient will perform toilet transfers with assist, with/without cues using equipment (OT) Outcome: Completed/Met   Problem: RH Attention Goal: LTG Patient will demonstrate this level of attention during functional activites (OT) Description: LTG:  Patient will demonstrate this level of attention during functional activites  (OT) Outcome: Completed/Met   Problem: RH Awareness Goal: LTG: Patient will demonstrate awareness during functional activites type of (OT) Description: LTG: Patient will demonstrate awareness during functional activites type of (OT) Outcome: Completed/Met

## 2019-08-16 NOTE — Progress Notes (Signed)
This nurse spoke w patient's sister about possible discharge tomorrow. This nurse assured that upon every discharge we assure the patient is assisted with packing up belongings; for she was concerned of patient leaving belongings. Family member expressed concerns of the pt being assisted w toileting. Informing myself that herself and another family member t-1 assisted him and expressed concern of the redness on his bottom. This nurse who was present t-1 was aware of the pt having 4+ incontinent toileting episodes, for which myself and NTs addressed when informed and available to provide care at that time, unless caring for another pt at that time.This nurse informed Lovorn, MD of incontinence and was told to try holding bowel meds as a first step to help w incontinent episodes and MASD. This nurse personally toileted the pt before the end of shift and applied barrier cream to help with MASD on bottom. (informed sister of these actions). Today, needs that the patient has expressed or inform staff of are being addressed, for staff is working together to address the unit's needs.

## 2019-08-16 NOTE — Progress Notes (Signed)
[place] PHYSICAL MEDICINE & REHABILITATION PROGRESS NOTE   Subjective/Complaints:  [place] Has no complaints Weekend notes reviewed  ROS:   Pt denies SOB, abd pain, CP, N/V/C/D, and vision changes   Objective:   No results found. No results for input(s): WBC, HGB, HCT, PLT in the last 72 hours. No results for input(s): NA, K, CL, CO2, GLUCOSE, BUN, CREATININE, CALCIUM in the last 72 hours.  Intake/Output Summary (Last 24 hours) at 08/16/2019 0914 Last data filed at 08/16/2019 0111 Gross per 24 hour  Intake 360 ml  Output 1150 ml  Net -790 ml     Physical Exam: Vital Signs Blood pressure 105/74, pulse 68, temperature 97.7 F (36.5 C), temperature source Oral, resp. rate 16, height 5\' 3"  (1.6 m), weight 78.3 kg, SpO2 97 %. General: Alert and oriented x 3, No apparent distress HEENT: Head is normocephalic, atraumatic, PERRLA, EOMI, sclera anicteric, oral mucosa pink and moist, dentition intact, ext ear canals clear,  Neck: Supple without JVD or lymphadenopathy Heart: Reg rate and rhythm. No murmurs rubs or gallops Chest: CTA bilaterally without wheezes, rales, or rhonchi; no distress Abdomen: Soft, non-tender, non-distended, bowel sounds positive. Extremities: No clubbing, cyanosis, or edema. Pulses are 2+ Skin: Warm and dry.  Intact. Psych:vague but appropriate Musc: No edema in extremities.  No tenderness in extremities. Musc: No edema in extremities.  No tenderness in extremities. Neuro: Alert and oriented x1- a lot of word substitutions- no change Motor: LUE 0/5 proximal to distal, unchanged LLE: HF, KE 3/5, ADF 1+/5, some improvement.  Assessment/Plan: 1. Functional deficits secondary to right thalamocapsular hemorrhage which require 3+ hours per day of interdisciplinary therapy in a comprehensive inpatient rehab setting.  Physiatrist is providing close team supervision and 24 hour management of active medical problems listed below.  Physiatrist and rehab team  continue to assess barriers to discharge/monitor patient progress toward functional and medical goals  Care Tool:  Bathing    Body parts bathed by patient: Chest, Abdomen, Front perineal area, Right upper leg, Left upper leg, Face   Body parts bathed by helper: Buttocks, Right lower leg, Left lower leg, Right arm, Left arm     Bathing assist Assist Level: Maximal Assistance - Patient 24 - 49%     Upper Body Dressing/Undressing Upper body dressing Upper body dressing/undressing activity did not occur (including orthotics): Refused (asked. pt did not want to change shirt. ) What is the patient wearing?: Pull over shirt    Upper body assist Assist Level: Moderate Assistance - Patient 50 - 74%    Lower Body Dressing/Undressing Lower body dressing      What is the patient wearing?: Pants, Incontinence brief     Lower body assist Assist for lower body dressing: Maximal Assistance - Patient 25 - 49%     Toileting Toileting Toileting Activity did not occur (Clothing management and hygiene only): N/A (no void or bm)  Toileting assist Assist for toileting: Set up assist (Urinal)     Transfers Chair/bed transfer  Transfers assist  Chair/bed transfer activity did not occur: Safety/medical concerns  Chair/bed transfer assist level: Moderate Assistance - Patient 50 - 74% Chair/bed transfer assistive device: Sliding board   Locomotion Ambulation   Ambulation assist   Ambulation activity did not occur: Safety/medical concerns  Assist level: 2 helpers Assistive device: Hand held assist Max distance: 60 ft   Walk 10 feet activity   Assist  Walk 10 feet activity did not occur: Safety/medical concerns  Assist level: 2 helpers  Assistive device: Hand held assist   Walk 50 feet activity   Assist Walk 50 feet with 2 turns activity did not occur: Safety/medical concerns  Assist level: 2 helpers Assistive device: Hand held assist    Walk 150 feet activity   Assist  Walk 150 feet activity did not occur: Safety/medical concerns         Walk 10 feet on uneven surface  activity   Assist Walk 10 feet on uneven surfaces activity did not occur: Safety/medical concerns         Wheelchair     Assist Will patient use wheelchair at discharge?: Yes Type of Wheelchair: Manual Wheelchair activity did not occur: Safety/medical concerns  Wheelchair assist level: Moderate Assistance - Patient 50 - 74% Max wheelchair distance: 150    Wheelchair 50 feet with 2 turns activity    Assist    Wheelchair 50 feet with 2 turns activity did not occur: Safety/medical concerns   Assist Level: Moderate Assistance - Patient 50 - 74%   Wheelchair 150 feet activity     Assist  Wheelchair 150 feet activity did not occur: Safety/medical concerns   Assist Level: Moderate Assistance - Patient 50 - 74%   Blood pressure 105/74, pulse 68, temperature 97.7 F (36.5 C), temperature source Oral, resp. rate 16, height 5\' 3"  (1.6 m), weight 78.3 kg, SpO2 97 %.  Medical Problem List and Plan: 1.Left side hemiparesis/dysarthriasecondary to rightthalamocapsularhemorrhage most likely related to hypertensive crisis as well as old left temporal parietal infarction with small vessel disease  Continue CIR  -WHO LUE, PRAFO nightly 2. Antithrombotics: -DVT/anticoagulation:Continue SCDs -antiplatelet therapy: N/A 3. Pain Management:Tylenol as needed. Norco discontinued since not required.  4. Mood:Zoloft 25 mg daily -antipsychotic agents: Geodon 40 mg nightly -mood seems for the most part well controlled 5. Neuropsych: This patientis notcapable of making decisions on hisown behalf. 6. Skin/Wound Care:Continue local care to hand, elevate LUE while in bed/chair 7. Fluids/Electrolytes/Nutrition:.  -protein supp 8. Post stroke dysphagia. Dysphagia #3 nectar thick liquids. Advance perspeech therapy. MBS on 7/29  shows markedly improved oropharyngeal swallow function.  9. History of seizure disorder. Vimpat 200 mg twice daily, Keppra 1500 mg twice daily, Depakote 500 mg twice daily.  10. Hypertension. Coreg 3.125 mg twice daily.    8/2: BP low to normal: continue current regimen.  11. PAF.  -No anticoagulationdue to history of left MCA infarction with hemorrhagic conversion 12. Hyperlipidemia. Resumed Lipitor   13. Obesity. BMI 30. Dietary follow-up 14. CAD with CABG 2019. No chest pain or shortness of breath. 15.Mild thrombocytopenia: Resolved 16. Neurogenic bladder:   - I/O cath prn, monitor PVR's, increased checks to every 6 horus  -OOB to void, voiding  -pt completed 5 days of macrodantin for 80k staph epi  Continue same, repeat UA negative, urine culture remains pending  7/26- No growth on Cx 17. Cognitive deficits: Continue SLP with focus on attention, initiation  7/31- improving somewhat in attention   LOS: 21 days A FACE TO FACE EVALUATION WAS PERFORMED  8/31 Keric Zehren 08/16/2019, 9:14 AM

## 2019-08-17 DIAGNOSIS — I2510 Atherosclerotic heart disease of native coronary artery without angina pectoris: Secondary | ICD-10-CM | POA: Diagnosis not present

## 2019-08-17 DIAGNOSIS — I69891 Dysphagia following other cerebrovascular disease: Secondary | ICD-10-CM | POA: Diagnosis not present

## 2019-08-17 DIAGNOSIS — M79602 Pain in left arm: Secondary | ICD-10-CM | POA: Diagnosis not present

## 2019-08-17 DIAGNOSIS — N319 Neuromuscular dysfunction of bladder, unspecified: Secondary | ICD-10-CM | POA: Diagnosis not present

## 2019-08-17 DIAGNOSIS — K5909 Other constipation: Secondary | ICD-10-CM | POA: Diagnosis not present

## 2019-08-17 DIAGNOSIS — M6281 Muscle weakness (generalized): Secondary | ICD-10-CM | POA: Diagnosis not present

## 2019-08-17 DIAGNOSIS — N390 Urinary tract infection, site not specified: Secondary | ICD-10-CM | POA: Diagnosis not present

## 2019-08-17 DIAGNOSIS — Z743 Need for continuous supervision: Secondary | ICD-10-CM | POA: Diagnosis not present

## 2019-08-17 DIAGNOSIS — I499 Cardiac arrhythmia, unspecified: Secondary | ICD-10-CM | POA: Diagnosis not present

## 2019-08-17 DIAGNOSIS — I610 Nontraumatic intracerebral hemorrhage in hemisphere, subcortical: Secondary | ICD-10-CM | POA: Diagnosis not present

## 2019-08-17 DIAGNOSIS — I69354 Hemiplegia and hemiparesis following cerebral infarction affecting left non-dominant side: Secondary | ICD-10-CM | POA: Diagnosis not present

## 2019-08-17 DIAGNOSIS — I480 Paroxysmal atrial fibrillation: Secondary | ICD-10-CM | POA: Diagnosis not present

## 2019-08-17 DIAGNOSIS — R600 Localized edema: Secondary | ICD-10-CM | POA: Diagnosis not present

## 2019-08-17 DIAGNOSIS — Z7401 Bed confinement status: Secondary | ICD-10-CM | POA: Diagnosis not present

## 2019-08-17 DIAGNOSIS — I10 Essential (primary) hypertension: Secondary | ICD-10-CM | POA: Diagnosis not present

## 2019-08-17 DIAGNOSIS — M79605 Pain in left leg: Secondary | ICD-10-CM | POA: Diagnosis not present

## 2019-08-17 DIAGNOSIS — E785 Hyperlipidemia, unspecified: Secondary | ICD-10-CM | POA: Diagnosis not present

## 2019-08-17 DIAGNOSIS — I69828 Other speech and language deficits following other cerebrovascular disease: Secondary | ICD-10-CM | POA: Diagnosis not present

## 2019-08-17 DIAGNOSIS — I6930 Unspecified sequelae of cerebral infarction: Secondary | ICD-10-CM | POA: Diagnosis not present

## 2019-08-17 DIAGNOSIS — G459 Transient cerebral ischemic attack, unspecified: Secondary | ICD-10-CM | POA: Diagnosis not present

## 2019-08-17 DIAGNOSIS — L258 Unspecified contact dermatitis due to other agents: Secondary | ICD-10-CM | POA: Insufficient documentation

## 2019-08-17 DIAGNOSIS — I629 Nontraumatic intracranial hemorrhage, unspecified: Secondary | ICD-10-CM | POA: Diagnosis not present

## 2019-08-17 DIAGNOSIS — Z86718 Personal history of other venous thrombosis and embolism: Secondary | ICD-10-CM | POA: Diagnosis not present

## 2019-08-17 DIAGNOSIS — R1312 Dysphagia, oropharyngeal phase: Secondary | ICD-10-CM | POA: Diagnosis not present

## 2019-08-17 DIAGNOSIS — M2550 Pain in unspecified joint: Secondary | ICD-10-CM | POA: Diagnosis not present

## 2019-08-17 DIAGNOSIS — R0989 Other specified symptoms and signs involving the circulatory and respiratory systems: Secondary | ICD-10-CM | POA: Diagnosis not present

## 2019-08-17 DIAGNOSIS — K219 Gastro-esophageal reflux disease without esophagitis: Secondary | ICD-10-CM | POA: Diagnosis not present

## 2019-08-17 DIAGNOSIS — I609 Nontraumatic subarachnoid hemorrhage, unspecified: Secondary | ICD-10-CM | POA: Diagnosis not present

## 2019-08-17 DIAGNOSIS — I619 Nontraumatic intracerebral hemorrhage, unspecified: Secondary | ICD-10-CM | POA: Diagnosis not present

## 2019-08-17 MED ORDER — RESTORE BARRIER CREA: 1.0000 "application " | TOPICAL_CREAM | Freq: Two times a day (BID) | 0 refills | Status: DC

## 2019-08-17 NOTE — Progress Notes (Signed)
MASD on sacrum has resolved with use of barrier cream. Intergluteal area now dry with mild erythema--to continue barrier cream bid and Senna discontinued.

## 2019-08-17 NOTE — Progress Notes (Signed)
Patient ID: [name] Cruz, male   DOB: [birth year], 64 y.o.   MRN: [id number]  Pt is awake and alert this AM, dressed and ready for dischage. Last set of vitals were stable and the pt has no complaints this morning. This nurse will call his sister to inform her that he has left. NT swept the room to assure all belongings were out, and this nurse informed NT to check the side of of bed for phone and charger

## 2019-08-17 NOTE — Progress Notes (Signed)
Patient ID: [name] Cruz, male   DOB: 09/25/1955, 64 y.o.   MRN: [id number]   Pt going to [facility] Rm#[id number]; nurse report [phone].  Cecile Sheerer, MSW, LCSWA Office: [phone] Cell: [phone] Fax: [phone]

## 2019-08-17 NOTE — Progress Notes (Signed)
Patient left shoes when discharged. Sister, Kizzie Ide notified via voicemail.

## 2019-08-17 NOTE — Progress Notes (Signed)
[place] PHYSICAL MEDICINE & REHABILITATION PROGRESS NOTE   Subjective/Complaints:  No new complaints today.   ROS: Patient denies fever, rash, sore throat, blurred vision, nausea, vomiting, diarrhea, cough, shortness of breath or chest pain, joint or back pain, headache, or mood change.    Objective:   No results found. No results for input(s): WBC, HGB, HCT, PLT in the last 72 hours. No results for input(s): NA, K, CL, CO2, GLUCOSE, BUN, CREATININE, CALCIUM in the last 72 hours.  Intake/Output Summary (Last 24 hours) at 08/17/2019 0929 Last data filed at 08/16/2019 2035 Gross per 24 hour  Intake 720 ml  Output 200 ml  Net 520 ml     Physical Exam: Vital Signs Blood pressure 134/71, pulse 68, temperature 97.8 F (36.6 C), temperature source Oral, resp. rate 16, height 5\' 3"  (1.6 m), weight 78.3 kg, SpO2 99 %. Constitutional: No distress . Vital signs reviewed. HEENT: EOMI, oral membranes moist Neck: supple Cardiovascular: RRR without murmur. No JVD    Respiratory/Chest: CTA Bilaterally without wheezes or rales. Normal effort    GI/Abdomen: BS +, non-tender, non-distended Ext: no clubbing, cyanosis, or edema Psych: pleasant and cooperative Skin: Warm and dry.  Intact. Psych:vague but appropriate Musc: No edema in extremities.  No tenderness in extremities. Musc: No edema in extremities.  No tenderness in extremities. Neuro: Alert and oriented x1- a lot of word substitutions- stable Motor: LUE 0/5 proximal to distal, unchanged LLE: HF, KE 3/5, ADF 1+/5, some improvement.  Assessment/Plan: 1. Functional deficits secondary to right thalamocapsular hemorrhage which require 3+ hours per day of interdisciplinary therapy in a comprehensive inpatient rehab setting.  Physiatrist is providing close team supervision and 24 hour management of active medical problems listed below.  Physiatrist and rehab team continue to assess barriers to discharge/monitor patient progress toward  functional and medical goals  Care Tool:  Bathing    Body parts bathed by patient: Chest, Abdomen, Front perineal area, Right upper leg, Left upper leg, Face, Left arm   Body parts bathed by helper: Right arm, Buttocks, Left lower leg, Right lower leg     Bathing assist Assist Level: Moderate Assistance - Patient 50 - 74%     Upper Body Dressing/Undressing Upper body dressing Upper body dressing/undressing activity did not occur (including orthotics): Refused (asked. pt did not want to change shirt. ) What is the patient wearing?: Pull over shirt    Upper body assist Assist Level: Moderate Assistance - Patient 50 - 74%    Lower Body Dressing/Undressing Lower body dressing      What is the patient wearing?: Pants     Lower body assist Assist for lower body dressing: Maximal Assistance - Patient 25 - 49%     Toileting Toileting Toileting Activity did not occur (Clothing management and hygiene only): N/A (no void or bm)  Toileting assist Assist for toileting: Maximal Assistance - Patient 25 - 49%     Transfers Chair/bed transfer  Transfers assist  Chair/bed transfer activity did not occur: Safety/medical concerns  Chair/bed transfer assist level: Moderate Assistance - Patient 50 - 74% Chair/bed transfer assistive device: Sliding board   Locomotion Ambulation   Ambulation assist   Ambulation activity did not occur: Safety/medical concerns  Assist level: 2 helpers Assistive device: Hand held assist Max distance: 60 ft   Walk 10 feet activity   Assist  Walk 10 feet activity did not occur: Safety/medical concerns  Assist level: 2 helpers Assistive device: Hand held assist   Walk 50  feet activity   Assist Walk 50 feet with 2 turns activity did not occur: Safety/medical concerns  Assist level: 2 helpers Assistive device: Hand held assist    Walk 150 feet activity   Assist Walk 150 feet activity did not occur: Safety/medical concerns          Walk 10 feet on uneven surface  activity   Assist Walk 10 feet on uneven surfaces activity did not occur: Safety/medical concerns         Wheelchair     Assist Will patient use wheelchair at discharge?: Yes Type of Wheelchair: Manual Wheelchair activity did not occur: Safety/medical concerns  Wheelchair assist level: Moderate Assistance - Patient 50 - 74% Max wheelchair distance: 150    Wheelchair 50 feet with 2 turns activity    Assist    Wheelchair 50 feet with 2 turns activity did not occur: Safety/medical concerns   Assist Level: Moderate Assistance - Patient 50 - 74%   Wheelchair 150 feet activity     Assist  Wheelchair 150 feet activity did not occur: Safety/medical concerns   Assist Level: Moderate Assistance - Patient 50 - 74%   Blood pressure 134/71, pulse 68, temperature 97.8 F (36.6 C), temperature source Oral, resp. rate 16, height 5\' 3"  (1.6 m), weight 78.3 kg, SpO2 99 %.  Medical Problem List and Plan: 1.Left side hemiparesis/dysarthriasecondary to rightthalamocapsularhemorrhage most likely related to hypertensive crisis as well as old left temporal parietal infarction with small vessel disease  Continue CIR  -WHO LUE, PRAFO nightly  -dc to SNF today  -f/u with PM&R clinic in 4-6 weeks 2. Antithrombotics: -DVT/anticoagulation:Continue SCDs -antiplatelet therapy: N/A 3. Pain Management:Tylenol as needed. Norco discontinued since not required.  4. Mood:Zoloft 25 mg daily -antipsychotic agents: Geodon 40 mg nightly -mood seems for the most part well controlled 5. Neuropsych: This patientis notcapable of making decisions on hisown behalf. 6. Skin/Wound Care:Continue local care to hand, elevate LUE while in bed/chair 7. Fluids/Electrolytes/Nutrition:.  -protein supp 8. Post stroke dysphagia. Dysphagia #3 nectar thick liquids. Advance perspeech therapy. MBS on 7/29 shows markedly  improved oropharyngeal swallow function.  9. History of seizure disorder. Vimpat 200 mg twice daily, Keppra 1500 mg twice daily, Depakote 500 mg twice daily.  10. Hypertension. Coreg 3.125 mg twice daily.    8/3: BP controlled  11. PAF.  -No anticoagulationdue to history of left MCA infarction with hemorrhagic conversion 12. Hyperlipidemia. Resumed Lipitor   13. Obesity. BMI 30. Dietary follow-up 14. CAD with CABG 2019. No chest pain or shortness of breath. 15.Mild thrombocytopenia: Resolved 16. Neurogenic bladder:   - I/O cath prn, monitor PVR's, increased checks to every 6 horus  -OOB to void, voiding  -pt completed 5 days of macrodantin for 80k staph epi  Continue same, repeat UA negative, urine culture remains pending  7/26- No growth on Cx 17. Cognitive deficits: Continue SLP with focus on attention, initiation  7/31- improving somewhat in attention   LOS: 22 days A FACE TO FACE EVALUATION WAS PERFORMED  8/31 08/17/2019, 9:29 AM

## 2019-08-17 NOTE — Progress Notes (Signed)
Physical Therapy Discharge Summary  Patient Details  Name: [name] Cruz MRN: [id number] Date of Birth: 04-11-1955  Today's Date: 08/17/2019   Patient has met 1 of 7 long term goals due to improved activity tolerance, improved balance, improved postural control, increased strength and ability to compensate for deficits.  Patient to discharge at a wheelchair level Max Assist for transfers.   Patient's care partner unavailable to provide the necessary physical and cognitive assistance at discharge.  Reasons goals not met: impaired balance, awareness, cognition, memory  Recommendation:  Patient will benefit from ongoing skilled PT services in skilled nursing facility setting to continue to advance safe functional mobility, address ongoing impairments in NMR, balance, midline orientation, bed mobility, transfers, gait, stairs as able, and minimize fall risk.  Equipment: No equipment provided - TBD in next venue  Reasons for discharge: discharge from hospital  Patient/family agrees with progress made and goals achieved: Yes  PT Discharge Precautions/Restrictions Precautions Precautions: Fall Precaution Comments: L hemi, L inattention, pusher Restrictions Weight Bearing Restrictions: No  Vision/Perception  Pt wears glasses at all times at baseline. Perception Perception: Impaired Inattention/Neglect: Does not attend to left side of body Praxis Praxis Impairment Details: Perseveration;Motor planning;Initiation   Cognition Overall Cognitive Status: Impaired/Different from baseline Arousal/Alertness: Awake/alert Orientation Level: Oriented to person;Oriented to place;Oriented to situation Memory: Impaired Memory Impairment: Decreased short term memory;Decreased recall of new information Awareness: Impaired Awareness Impairment: Intellectual impairment Problem Solving: Impaired Problem Solving Impairment: Functional basic;Verbal basic Safety/Judgment:  Impaired  Sensation Sensation Light Touch: Not tested Light Touch Impaired Details: Impaired LUE;Impaired LLE Proprioception: Impaired by gross assessment Proprioception Impaired Details: Impaired LLE;Impaired LUE Coordination Gross Motor Movements are Fluid and Coordinated: No (significant L hemiparesis) Fine Motor Movements are Fluid and Coordinated: No (significant L hemiparesis)  Motor  Motor Motor: Abnormal postural alignment and control (pt pushing L with RUE/RLE, L lateral lean in sitting/standing) Motor - Discharge Observations: generalized weakness, L hemiparesis   Mobility Bed Mobility Bed Mobility: Rolling Right;Rolling Left;Supine to Sit;Sit to Supine Rolling Right: Moderate Assistance - Patient 50-74% Rolling Left: Minimal Assistance - Patient > 75% Supine to Sit: Moderate Assistance - Patient 50-74% Sit to Supine: Moderate Assistance - Patient 50-74% Transfers Transfers: Sit to Stand;Stand to Sit;Stand Pivot Transfers Sit to Stand: Moderate Assistance - Patient 50-74% Stand to Sit: Moderate Assistance - Patient 50-74% Stand Pivot Transfers: Maximal Assistance - Patient 25 - 49%;Moderate Assistance - Patient 50 - 74%;2 Helpers Stand Pivot Transfer Details: Tactile cues for initiation;Tactile cues for weight shifting;Tactile cues for placement;Tactile cues for sequencing;Tactile cues for posture;Verbal cues for sequencing;Verbal cues for technique;Verbal cues for precautions/safety;Manual facilitation for placement;Manual facilitation for weight shifting Transfer (Assistive device):  (pulling R on w/c armrest)  Locomotion  Gait Ambulation: Yes Gait Assistance: 2 Helpers;Moderate Assistance - Patient 50-74% Gait Distance (Feet): 60 Feet Assistive device: 2 person hand held assist Gait Assistance Details: Tactile cues for weight shifting;Tactile cues for posture;Tactile cues for placement;Verbal cues for sequencing Gait Assistance Details: PT provides assistance for  weight shifting R, advancing & cuing for placing LLE, assisting with foward hip on L Gait Gait: Yes Gait Pattern: Decreased step length - right;Decreased step length - left;Decreased stance time - left;Decreased stride length;Decreased hip/knee flexion - right;Decreased hip/knee flexion - left;Decreased dorsiflexion - right;Decreased dorsiflexion - left;Decreased weight shift to right;Decreased trunk rotation;Trunk rotated posteriorly on left;Trunk flexed Stairs / Additional Locomotion Stairs: No Wheelchair Mobility Wheelchair Mobility: Yes Wheelchair Assistance: Moderate Assistance - Patient 50 - 74% (mod<>max assist for turning)  Wheelchair Propulsion: Right upper extremity (difficulty coordinating RLE) Wheelchair Parts Management: Needs assistance Distance: 150 ft   Trunk/Postural Assessment  Cervical Assessment Cervical Assessment: Exceptions to [facility] (forward head) Thoracic Assessment Thoracic Assessment: Exceptions to [facility] (rounded shoulders) Lumbar Assessment Lumbar Assessment: Exceptions to [facility] (posterior pelvic tilt) Postural Control Postural Control: Deficits on evaluation Righting Reactions: impaired Protective Responses: impaired   Balance Balance Balance Assessed: Yes Static Sitting Balance Static Sitting - Balance Support: Feet supported;Right upper extremity supported Static Sitting - Level of Assistance: 4: Min assist Dynamic Sitting Balance Dynamic Sitting - Balance Support: Feet supported Dynamic Sitting - Level of Assistance: 3: Mod assist Dynamic Standing Balance Dynamic Standing - Balance Support: During functional activity;Bilateral upper extremity supported Dynamic Standing - Level of Assistance: 3: Mod assist;2: Max assist (+2 assist during gait with BUE HHA)  Extremity Assessment  RUE Assessment RUE Assessment: Within Functional Limits LUE Assessment LUE Assessment: Exceptions to [facility] Passive Range of Motion (PROM) Comments: WFL General Strength  Comments: no active movement noted LUE Body System: Neuro Brunstrum levels for arm and hand: Arm;Hand Brunstrum level for arm: Stage II Synergy is developing Brunstrum level for hand: Stage I Flaccidity LUE Strength Left Shoulder Flexion: 2-/5 Left Elbow Flexion: 2-/5 LUE Tone LUE Tone: Mild  RLE Assessment RLE Assessment: Within Functional Limits Passive Range of Motion (PROM) Comments: tight hip flexors, need to stretch before standing/gait LLE Assessment LLE Assessment: Exceptions to [facility] (pt able to weight bear without buckling noted, minimal to no dorsiflexion noted, pt able to advance LLE during gait with extra time)    Waunita Schooner 08/17/2019, 4:32 PM

## 2019-08-18 NOTE — Progress Notes (Signed)
Speech Language Pathology Discharge Summary  Patient Details  Name: [name] Cruz MRN: [id number] Date of Birth: [birth year]  Patient has met 10 of 10 long term goals.  Patient to discharge at [facility] level.   Reasons goals not met: N/A   Clinical Impression/Discharge Summary: Patient has made functional gains and has met 10 of 10 LTGs this admission. Currently, patient is consuming Dys. 3 textures with thin liquids with minimal overt s/s of aspiration and overall mod I for use of swallowing compensatory strategies. Patient requires overall Min A verbal cues to complete functional and familiar tasks safely in regards to problem solving and recall and Mod A verbal cues for emergent awareness. Patient also demonstrates improved speech intelligibility and word-finding with increased ability to express basic wants/needs. Patient's family is unable to provide the necessary level of assistance needed at this time, therefore, patient will be discharged to a SNF. Patient would benefit from f/u SLP services to maximize his cognitive-linguistic and swallowing function in order to reduce caregiver burden.   Care Partner:  Caregiver Able to Provide Assistance: No     Recommendation:  Skilled Nursing facility  Rationale for SLP Follow Up: Maximize cognitive function and independence;Maximize functional communication;Maximize swallowing safety;Reduce caregiver burden   Equipment: N/A   Reasons for discharge: Discharged from hospital   Patient/Family Agrees with Progress Made and Goals Achieved: Yes    [place], [place] 08/18/2019, 6:16 AM

## 2019-08-18 NOTE — Progress Notes (Signed)
Inpatient Rehabilitation Care Coordinator  Discharge Note  The overall goal for the admission was met for:   Discharge location: Yes. D/c to [facility] Rm#[id number]; nurse report [phone].  Length of Stay: Yes. 21 days.   Discharge activity level: Yes. Max A  Home/community participation: Yes. Limited.   Services provided included: MD, RD, PT, OT, SLP, RN, CM, TR, Pharmacy, Neuropsych and SW  Financial Services: Private Insurance: [facility] Medicare  Follow-up services arranged: Other: N/A  Comments (or additional information): contact pt sister [name] [phone]  Patient/Family verbalized understanding of follow-up arrangements: Yes  Individual responsible for coordination of the follow-up plan: Pt to have assistance with coordinating care needs.   Confirmed correct DME delivered: Rana Snare 08/18/2019    Rana Snare

## 2019-08-20 DIAGNOSIS — M79605 Pain in left leg: Secondary | ICD-10-CM | POA: Diagnosis not present

## 2019-08-20 DIAGNOSIS — I6930 Unspecified sequelae of cerebral infarction: Secondary | ICD-10-CM | POA: Diagnosis not present

## 2019-08-20 DIAGNOSIS — M79602 Pain in left arm: Secondary | ICD-10-CM | POA: Diagnosis not present

## 2019-08-20 DIAGNOSIS — I69354 Hemiplegia and hemiparesis following cerebral infarction affecting left non-dominant side: Secondary | ICD-10-CM | POA: Diagnosis not present

## 2019-08-26 LAB — FECAL OCCULT BLOOD, GUAIAC: Fecal Occult Blood: NEGATIVE

## 2019-09-01 DIAGNOSIS — K5909 Other constipation: Secondary | ICD-10-CM | POA: Diagnosis not present

## 2019-09-01 DIAGNOSIS — R600 Localized edema: Secondary | ICD-10-CM | POA: Diagnosis not present

## 2019-09-01 DIAGNOSIS — I6930 Unspecified sequelae of cerebral infarction: Secondary | ICD-10-CM | POA: Diagnosis not present

## 2019-09-02 DIAGNOSIS — I10 Essential (primary) hypertension: Secondary | ICD-10-CM | POA: Diagnosis not present

## 2019-09-02 DIAGNOSIS — I69354 Hemiplegia and hemiparesis following cerebral infarction affecting left non-dominant side: Secondary | ICD-10-CM | POA: Diagnosis not present

## 2019-09-02 DIAGNOSIS — Z86718 Personal history of other venous thrombosis and embolism: Secondary | ICD-10-CM | POA: Diagnosis not present

## 2019-09-02 DIAGNOSIS — M6281 Muscle weakness (generalized): Secondary | ICD-10-CM | POA: Diagnosis not present

## 2019-09-02 DIAGNOSIS — N319 Neuromuscular dysfunction of bladder, unspecified: Secondary | ICD-10-CM | POA: Diagnosis not present

## 2019-09-03 DIAGNOSIS — I69354 Hemiplegia and hemiparesis following cerebral infarction affecting left non-dominant side: Secondary | ICD-10-CM | POA: Diagnosis not present

## 2019-09-03 DIAGNOSIS — I610 Nontraumatic intracerebral hemorrhage in hemisphere, subcortical: Secondary | ICD-10-CM | POA: Diagnosis not present

## 2019-09-05 NOTE — Telephone Encounter (Addendum)
Ok to not do iFOB at this time. How is he recovering after hospitalization last month? He has upcoming appt early next month with neuro and PM&R.

## 2019-09-06 ENCOUNTER — Other Ambulatory Visit: Payer: Self-pay | Admitting: Family Medicine

## 2019-09-06 ENCOUNTER — Telehealth: Payer: Self-pay | Admitting: Family Medicine

## 2019-09-06 NOTE — Telephone Encounter (Signed)
Lurena Joiner  (sister) called wanting to know if hospice can come to pt home to help take care of him.  She cannot do this by her self.  [facility] is at [facility]

## 2019-09-07 DIAGNOSIS — I509 Heart failure, unspecified: Secondary | ICD-10-CM | POA: Diagnosis not present

## 2019-09-07 DIAGNOSIS — I480 Paroxysmal atrial fibrillation: Secondary | ICD-10-CM | POA: Diagnosis not present

## 2019-09-07 DIAGNOSIS — I2510 Atherosclerotic heart disease of native coronary artery without angina pectoris: Secondary | ICD-10-CM | POA: Diagnosis not present

## 2019-09-07 DIAGNOSIS — R1310 Dysphagia, unspecified: Secondary | ICD-10-CM | POA: Diagnosis not present

## 2019-09-07 DIAGNOSIS — I69191 Dysphagia following nontraumatic intracerebral hemorrhage: Secondary | ICD-10-CM | POA: Diagnosis not present

## 2019-09-07 DIAGNOSIS — I252 Old myocardial infarction: Secondary | ICD-10-CM | POA: Diagnosis not present

## 2019-09-07 DIAGNOSIS — I69154 Hemiplegia and hemiparesis following nontraumatic intracerebral hemorrhage affecting left non-dominant side: Secondary | ICD-10-CM | POA: Diagnosis not present

## 2019-09-07 DIAGNOSIS — I69122 Dysarthria following nontraumatic intracerebral hemorrhage: Secondary | ICD-10-CM | POA: Diagnosis not present

## 2019-09-07 DIAGNOSIS — E785 Hyperlipidemia, unspecified: Secondary | ICD-10-CM | POA: Diagnosis not present

## 2019-09-07 DIAGNOSIS — I69192 Facial weakness following nontraumatic intracerebral hemorrhage: Secondary | ICD-10-CM | POA: Diagnosis not present

## 2019-09-07 DIAGNOSIS — Z86718 Personal history of other venous thrombosis and embolism: Secondary | ICD-10-CM | POA: Diagnosis not present

## 2019-09-07 DIAGNOSIS — K219 Gastro-esophageal reflux disease without esophagitis: Secondary | ICD-10-CM | POA: Diagnosis not present

## 2019-09-07 DIAGNOSIS — N319 Neuromuscular dysfunction of bladder, unspecified: Secondary | ICD-10-CM | POA: Diagnosis not present

## 2019-09-07 DIAGNOSIS — I110 Hypertensive heart disease with heart failure: Secondary | ICD-10-CM | POA: Diagnosis not present

## 2019-09-07 NOTE — Telephone Encounter (Signed)
Spoke with pt's sister, Steward Drone (on dpr), asking about pt.  States he's doing pretty good.  Still having trouble with his left side.  Says he was d/c yesterday from rehab.  But pt is taking all meds like he is supposed to.  I relayed Dr. Timoteo Expose message.  She verbalizes understanding.  FYI to Dr. [name].

## 2019-09-07 NOTE — Telephone Encounter (Signed)
Hospice is for last 6 months of life - does sister think pt has deteriorated to this point?  May be better to start with palliative care home referral - they can come and evaluate patient and see what needs he has. Alternatively if he's being sent home could come home with home health for temporary assistance?

## 2019-09-07 NOTE — Telephone Encounter (Signed)
Lvm asking pt's sister, Steward Drone (on dpr), to call back.  Need to relay Dr. Timoteo Expose message and get answers to his questions.

## 2019-09-07 NOTE — Telephone Encounter (Addendum)
Pt's sister, Steward Drone, returning my call.  I relay Dr. Timoteo Expose message.  She states do not accept any info from [place] or pt's mom, Dot.  Steward Drone is pt's POA and primary caregiver.  Says pt is nowhere near a need for hospice/palliative care.  States HH is already coming to see pt.  FYI to Dr. [name].

## 2019-09-07 NOTE — Telephone Encounter (Signed)
Noted. Thanks.

## 2019-09-08 ENCOUNTER — Telehealth: Payer: Self-pay | Admitting: Family Medicine

## 2019-09-08 DIAGNOSIS — N319 Neuromuscular dysfunction of bladder, unspecified: Secondary | ICD-10-CM | POA: Diagnosis not present

## 2019-09-08 DIAGNOSIS — I480 Paroxysmal atrial fibrillation: Secondary | ICD-10-CM | POA: Diagnosis not present

## 2019-09-08 DIAGNOSIS — I2510 Atherosclerotic heart disease of native coronary artery without angina pectoris: Secondary | ICD-10-CM | POA: Diagnosis not present

## 2019-09-08 DIAGNOSIS — I69192 Facial weakness following nontraumatic intracerebral hemorrhage: Secondary | ICD-10-CM | POA: Diagnosis not present

## 2019-09-08 DIAGNOSIS — I252 Old myocardial infarction: Secondary | ICD-10-CM | POA: Diagnosis not present

## 2019-09-08 DIAGNOSIS — E785 Hyperlipidemia, unspecified: Secondary | ICD-10-CM | POA: Diagnosis not present

## 2019-09-08 DIAGNOSIS — I110 Hypertensive heart disease with heart failure: Secondary | ICD-10-CM | POA: Diagnosis not present

## 2019-09-08 DIAGNOSIS — I69122 Dysarthria following nontraumatic intracerebral hemorrhage: Secondary | ICD-10-CM | POA: Diagnosis not present

## 2019-09-08 DIAGNOSIS — I509 Heart failure, unspecified: Secondary | ICD-10-CM | POA: Diagnosis not present

## 2019-09-08 DIAGNOSIS — R1310 Dysphagia, unspecified: Secondary | ICD-10-CM | POA: Diagnosis not present

## 2019-09-08 DIAGNOSIS — I69191 Dysphagia following nontraumatic intracerebral hemorrhage: Secondary | ICD-10-CM | POA: Diagnosis not present

## 2019-09-08 DIAGNOSIS — K219 Gastro-esophageal reflux disease without esophagitis: Secondary | ICD-10-CM | POA: Diagnosis not present

## 2019-09-08 DIAGNOSIS — I69154 Hemiplegia and hemiparesis following nontraumatic intracerebral hemorrhage affecting left non-dominant side: Secondary | ICD-10-CM | POA: Diagnosis not present

## 2019-09-08 DIAGNOSIS — Z86718 Personal history of other venous thrombosis and embolism: Secondary | ICD-10-CM | POA: Diagnosis not present

## 2019-09-08 NOTE — Telephone Encounter (Signed)
Mark returning call.  Informed him Dr. [name] is giving verbal orders for PT.

## 2019-09-08 NOTE — Telephone Encounter (Signed)
[name] called needing [facility]  Home health PT 2 x week for 9 weeks starting 09/07/19  Can leave verbal approval on phone if [facility] doesn't answer

## 2019-09-08 NOTE — Telephone Encounter (Signed)
E-scribed refill.  Pt has cpe on 10/27/19.  Plz schedule wellness and lab visits.  You'll talk with pt's sister, Steward Drone (on dpr).

## 2019-09-08 NOTE — Telephone Encounter (Signed)
Lvm for Loraine Leriche, PT with [facility], asking him to call back.  Need to inform him Dr. [name] is giving verbal orders for services requested for pt.

## 2019-09-08 NOTE — Telephone Encounter (Signed)
Agree with this. Thanks.  

## 2019-09-09 ENCOUNTER — Telehealth: Payer: Self-pay | Admitting: Family Medicine

## 2019-09-09 NOTE — Telephone Encounter (Signed)
Home Health Verbal Orders - Caller/Agency: Nancy/ [facility] Number: [phone] Requesting OT/PT/Skilled Nursing/Social Work/Speech Therapy: OT, Social work, Facilities manager Therapy Frequency: OT: 1x 9 weeks, Social work Water quality scientist for [employer]	[place] assistance, speech therapy evaluation due to stroke, and home health aide 1x 8 weeks.

## 2019-09-09 NOTE — Telephone Encounter (Signed)
Agree with these verbal orders. Thanks.  

## 2019-09-09 NOTE — Telephone Encounter (Signed)
Spoke with [name] Cruz informing her Dr. [name] is giving verbal orders for the services requested.

## 2019-09-13 NOTE — Telephone Encounter (Addendum)
Darlene clinical mgr with [facility] left v/m requesting cb ASAP for approval of [facility] plan of care; I spoke with Darlene clinical mgr for [facility] and advised of OK for verbal orders for this phone note as well as phone note on 09/08/19. Darlene voiced understanding and nothing further needed at this time. I also called Steward Drone pts sister (DPR signed) and she will call later today to [facility] to verify that the plan of care oked by Dr [name] last wk is being implemented. Steward Drone will cb if needed.

## 2019-09-13 NOTE — Telephone Encounter (Signed)
Steward Drone (sister POA) called checking on orders for [facility].  She received a call from [facility] stating they have not received approval .   Please Call darlene @ [phone]  Steward Drone is aware that you spoke with Harriett Sine on 8/26   [facility] phone  [phone]

## 2019-09-13 NOTE — Telephone Encounter (Signed)
Noted  

## 2019-09-14 DIAGNOSIS — I252 Old myocardial infarction: Secondary | ICD-10-CM | POA: Diagnosis not present

## 2019-09-14 DIAGNOSIS — I69154 Hemiplegia and hemiparesis following nontraumatic intracerebral hemorrhage affecting left non-dominant side: Secondary | ICD-10-CM | POA: Diagnosis not present

## 2019-09-14 DIAGNOSIS — I110 Hypertensive heart disease with heart failure: Secondary | ICD-10-CM | POA: Diagnosis not present

## 2019-09-14 DIAGNOSIS — I2510 Atherosclerotic heart disease of native coronary artery without angina pectoris: Secondary | ICD-10-CM | POA: Diagnosis not present

## 2019-09-14 DIAGNOSIS — R1310 Dysphagia, unspecified: Secondary | ICD-10-CM | POA: Diagnosis not present

## 2019-09-14 DIAGNOSIS — K219 Gastro-esophageal reflux disease without esophagitis: Secondary | ICD-10-CM | POA: Diagnosis not present

## 2019-09-14 DIAGNOSIS — Z86718 Personal history of other venous thrombosis and embolism: Secondary | ICD-10-CM | POA: Diagnosis not present

## 2019-09-14 DIAGNOSIS — I509 Heart failure, unspecified: Secondary | ICD-10-CM | POA: Diagnosis not present

## 2019-09-14 DIAGNOSIS — I69122 Dysarthria following nontraumatic intracerebral hemorrhage: Secondary | ICD-10-CM | POA: Diagnosis not present

## 2019-09-14 DIAGNOSIS — I480 Paroxysmal atrial fibrillation: Secondary | ICD-10-CM | POA: Diagnosis not present

## 2019-09-14 DIAGNOSIS — E785 Hyperlipidemia, unspecified: Secondary | ICD-10-CM | POA: Diagnosis not present

## 2019-09-14 DIAGNOSIS — I69192 Facial weakness following nontraumatic intracerebral hemorrhage: Secondary | ICD-10-CM | POA: Diagnosis not present

## 2019-09-14 DIAGNOSIS — N319 Neuromuscular dysfunction of bladder, unspecified: Secondary | ICD-10-CM | POA: Diagnosis not present

## 2019-09-14 DIAGNOSIS — I69191 Dysphagia following nontraumatic intracerebral hemorrhage: Secondary | ICD-10-CM | POA: Diagnosis not present

## 2019-09-16 ENCOUNTER — Telehealth: Payer: Self-pay | Admitting: *Deleted

## 2019-09-16 DIAGNOSIS — I480 Paroxysmal atrial fibrillation: Secondary | ICD-10-CM | POA: Diagnosis not present

## 2019-09-16 DIAGNOSIS — E785 Hyperlipidemia, unspecified: Secondary | ICD-10-CM | POA: Diagnosis not present

## 2019-09-16 DIAGNOSIS — K219 Gastro-esophageal reflux disease without esophagitis: Secondary | ICD-10-CM | POA: Diagnosis not present

## 2019-09-16 DIAGNOSIS — R1310 Dysphagia, unspecified: Secondary | ICD-10-CM | POA: Diagnosis not present

## 2019-09-16 DIAGNOSIS — Z86718 Personal history of other venous thrombosis and embolism: Secondary | ICD-10-CM | POA: Diagnosis not present

## 2019-09-16 DIAGNOSIS — I252 Old myocardial infarction: Secondary | ICD-10-CM | POA: Diagnosis not present

## 2019-09-16 DIAGNOSIS — I110 Hypertensive heart disease with heart failure: Secondary | ICD-10-CM | POA: Diagnosis not present

## 2019-09-16 DIAGNOSIS — N319 Neuromuscular dysfunction of bladder, unspecified: Secondary | ICD-10-CM | POA: Diagnosis not present

## 2019-09-16 DIAGNOSIS — I69192 Facial weakness following nontraumatic intracerebral hemorrhage: Secondary | ICD-10-CM | POA: Diagnosis not present

## 2019-09-16 DIAGNOSIS — I2510 Atherosclerotic heart disease of native coronary artery without angina pectoris: Secondary | ICD-10-CM | POA: Diagnosis not present

## 2019-09-16 DIAGNOSIS — I69191 Dysphagia following nontraumatic intracerebral hemorrhage: Secondary | ICD-10-CM | POA: Diagnosis not present

## 2019-09-16 DIAGNOSIS — I69154 Hemiplegia and hemiparesis following nontraumatic intracerebral hemorrhage affecting left non-dominant side: Secondary | ICD-10-CM | POA: Diagnosis not present

## 2019-09-16 DIAGNOSIS — I69122 Dysarthria following nontraumatic intracerebral hemorrhage: Secondary | ICD-10-CM | POA: Diagnosis not present

## 2019-09-16 DIAGNOSIS — I509 Heart failure, unspecified: Secondary | ICD-10-CM | POA: Diagnosis not present

## 2019-09-16 NOTE — Telephone Encounter (Signed)
[name] Cruz, speech path with [facility] left VM at triage. She is request verbal orders for speech therapty 1x a week for 2 week, then twice a week for 3 weeks, to address dysphagia and cognition,    [name] Cruz's CB# [phone]  Can leave VM if she doesn't answer

## 2019-09-16 NOTE — Telephone Encounter (Signed)
Agree with this please give verbal order. Thanks.

## 2019-09-16 NOTE — Telephone Encounter (Signed)
Spoke with [name] Cruz informing her Dr. [name] is giving verbal orders for the services requested.

## 2019-09-17 DIAGNOSIS — I69192 Facial weakness following nontraumatic intracerebral hemorrhage: Secondary | ICD-10-CM | POA: Diagnosis not present

## 2019-09-17 DIAGNOSIS — I69154 Hemiplegia and hemiparesis following nontraumatic intracerebral hemorrhage affecting left non-dominant side: Secondary | ICD-10-CM | POA: Diagnosis not present

## 2019-09-17 DIAGNOSIS — E785 Hyperlipidemia, unspecified: Secondary | ICD-10-CM | POA: Diagnosis not present

## 2019-09-17 DIAGNOSIS — I509 Heart failure, unspecified: Secondary | ICD-10-CM | POA: Diagnosis not present

## 2019-09-17 DIAGNOSIS — I69122 Dysarthria following nontraumatic intracerebral hemorrhage: Secondary | ICD-10-CM | POA: Diagnosis not present

## 2019-09-17 DIAGNOSIS — I2510 Atherosclerotic heart disease of native coronary artery without angina pectoris: Secondary | ICD-10-CM | POA: Diagnosis not present

## 2019-09-17 DIAGNOSIS — I69191 Dysphagia following nontraumatic intracerebral hemorrhage: Secondary | ICD-10-CM | POA: Diagnosis not present

## 2019-09-17 DIAGNOSIS — I480 Paroxysmal atrial fibrillation: Secondary | ICD-10-CM | POA: Diagnosis not present

## 2019-09-17 DIAGNOSIS — R1310 Dysphagia, unspecified: Secondary | ICD-10-CM | POA: Diagnosis not present

## 2019-09-17 DIAGNOSIS — K219 Gastro-esophageal reflux disease without esophagitis: Secondary | ICD-10-CM | POA: Diagnosis not present

## 2019-09-17 DIAGNOSIS — N319 Neuromuscular dysfunction of bladder, unspecified: Secondary | ICD-10-CM | POA: Diagnosis not present

## 2019-09-17 DIAGNOSIS — Z86718 Personal history of other venous thrombosis and embolism: Secondary | ICD-10-CM | POA: Diagnosis not present

## 2019-09-17 DIAGNOSIS — I252 Old myocardial infarction: Secondary | ICD-10-CM | POA: Diagnosis not present

## 2019-09-17 DIAGNOSIS — I110 Hypertensive heart disease with heart failure: Secondary | ICD-10-CM | POA: Diagnosis not present

## 2019-09-18 ENCOUNTER — Other Ambulatory Visit: Payer: Self-pay | Admitting: Family Medicine

## 2019-09-20 DIAGNOSIS — I69122 Dysarthria following nontraumatic intracerebral hemorrhage: Secondary | ICD-10-CM | POA: Diagnosis not present

## 2019-09-20 DIAGNOSIS — N319 Neuromuscular dysfunction of bladder, unspecified: Secondary | ICD-10-CM | POA: Diagnosis not present

## 2019-09-20 DIAGNOSIS — I2510 Atherosclerotic heart disease of native coronary artery without angina pectoris: Secondary | ICD-10-CM | POA: Diagnosis not present

## 2019-09-20 DIAGNOSIS — R1310 Dysphagia, unspecified: Secondary | ICD-10-CM | POA: Diagnosis not present

## 2019-09-20 DIAGNOSIS — Z86718 Personal history of other venous thrombosis and embolism: Secondary | ICD-10-CM | POA: Diagnosis not present

## 2019-09-20 DIAGNOSIS — I509 Heart failure, unspecified: Secondary | ICD-10-CM | POA: Diagnosis not present

## 2019-09-20 DIAGNOSIS — I110 Hypertensive heart disease with heart failure: Secondary | ICD-10-CM | POA: Diagnosis not present

## 2019-09-20 DIAGNOSIS — I252 Old myocardial infarction: Secondary | ICD-10-CM | POA: Diagnosis not present

## 2019-09-20 DIAGNOSIS — I69154 Hemiplegia and hemiparesis following nontraumatic intracerebral hemorrhage affecting left non-dominant side: Secondary | ICD-10-CM | POA: Diagnosis not present

## 2019-09-20 DIAGNOSIS — E785 Hyperlipidemia, unspecified: Secondary | ICD-10-CM | POA: Diagnosis not present

## 2019-09-20 DIAGNOSIS — I480 Paroxysmal atrial fibrillation: Secondary | ICD-10-CM | POA: Diagnosis not present

## 2019-09-20 DIAGNOSIS — I69192 Facial weakness following nontraumatic intracerebral hemorrhage: Secondary | ICD-10-CM | POA: Diagnosis not present

## 2019-09-20 DIAGNOSIS — K219 Gastro-esophageal reflux disease without esophagitis: Secondary | ICD-10-CM | POA: Diagnosis not present

## 2019-09-20 DIAGNOSIS — I69191 Dysphagia following nontraumatic intracerebral hemorrhage: Secondary | ICD-10-CM | POA: Diagnosis not present

## 2019-09-21 ENCOUNTER — Telehealth: Payer: Self-pay | Admitting: Adult Health

## 2019-09-21 DIAGNOSIS — I480 Paroxysmal atrial fibrillation: Secondary | ICD-10-CM | POA: Diagnosis not present

## 2019-09-21 DIAGNOSIS — Z86718 Personal history of other venous thrombosis and embolism: Secondary | ICD-10-CM | POA: Diagnosis not present

## 2019-09-21 DIAGNOSIS — I69192 Facial weakness following nontraumatic intracerebral hemorrhage: Secondary | ICD-10-CM | POA: Diagnosis not present

## 2019-09-21 DIAGNOSIS — I69154 Hemiplegia and hemiparesis following nontraumatic intracerebral hemorrhage affecting left non-dominant side: Secondary | ICD-10-CM | POA: Diagnosis not present

## 2019-09-21 DIAGNOSIS — N319 Neuromuscular dysfunction of bladder, unspecified: Secondary | ICD-10-CM | POA: Diagnosis not present

## 2019-09-21 DIAGNOSIS — I252 Old myocardial infarction: Secondary | ICD-10-CM | POA: Diagnosis not present

## 2019-09-21 DIAGNOSIS — R1310 Dysphagia, unspecified: Secondary | ICD-10-CM | POA: Diagnosis not present

## 2019-09-21 DIAGNOSIS — I2510 Atherosclerotic heart disease of native coronary artery without angina pectoris: Secondary | ICD-10-CM | POA: Diagnosis not present

## 2019-09-21 DIAGNOSIS — I110 Hypertensive heart disease with heart failure: Secondary | ICD-10-CM | POA: Diagnosis not present

## 2019-09-21 DIAGNOSIS — I69122 Dysarthria following nontraumatic intracerebral hemorrhage: Secondary | ICD-10-CM | POA: Diagnosis not present

## 2019-09-21 DIAGNOSIS — E785 Hyperlipidemia, unspecified: Secondary | ICD-10-CM | POA: Diagnosis not present

## 2019-09-21 DIAGNOSIS — I509 Heart failure, unspecified: Secondary | ICD-10-CM | POA: Diagnosis not present

## 2019-09-21 DIAGNOSIS — I69191 Dysphagia following nontraumatic intracerebral hemorrhage: Secondary | ICD-10-CM | POA: Diagnosis not present

## 2019-09-21 DIAGNOSIS — K219 Gastro-esophageal reflux disease without esophagitis: Secondary | ICD-10-CM | POA: Diagnosis not present

## 2019-09-21 MED ORDER — LEVETIRACETAM 750 MG PO TABS: 1500.0000 mg | ORAL_TABLET | Freq: Two times a day (BID) | ORAL | 1 refills | Status: DC

## 2019-09-21 NOTE — Telephone Encounter (Signed)
Pt is needing a refill on his levETIRAcetam (KEPPRA) 750 MG tablet [facility] [address] Pt's sister states the pharmacy has been reaching out since last week and now the pt is completely out of his medication.

## 2019-09-21 NOTE — Telephone Encounter (Signed)
Okay for both virtual visit and medication refill.  Thank you.

## 2019-09-21 NOTE — Telephone Encounter (Signed)
[facility] Last filled:  06/27/19, #90 Last OV:  07/14/19, hosp f/u Next OV:  10/27/19, AWV

## 2019-09-21 NOTE — Telephone Encounter (Signed)
Called back. Steward Drone states pt has had another stroke since last seen, back home now. However, it is extremely hard for them to bring him in for appt/hard to get him in and our of vehicle. He is incontinent of bowel/bladder. States he is otherwise stable. Requested VV. I scheduled VV for 11/15/19. She is aware this will be done through mychart with [facility]. Requesting refill on levETIRAcetam (KEPPRA) 750 MG tablet. Advised I will send message to JM,NP and make sure she is ok with plan.

## 2019-09-21 NOTE — Telephone Encounter (Signed)
E-scribe refill to pharmacy until pt appt on 11/15/19

## 2019-09-21 NOTE — Addendum Note (Signed)
Addended by: Arther Abbott on: 09/21/2019 02:12 PM   Modules accepted: Orders

## 2019-09-22 ENCOUNTER — Encounter: Payer: Medicare Other | Attending: Physical Medicine & Rehabilitation | Admitting: Physical Medicine & Rehabilitation

## 2019-09-22 ENCOUNTER — Inpatient Hospital Stay: Payer: Medicare Other | Admitting: Neurology

## 2019-09-22 DIAGNOSIS — Z86718 Personal history of other venous thrombosis and embolism: Secondary | ICD-10-CM | POA: Diagnosis not present

## 2019-09-22 DIAGNOSIS — I480 Paroxysmal atrial fibrillation: Secondary | ICD-10-CM | POA: Diagnosis not present

## 2019-09-22 DIAGNOSIS — I69191 Dysphagia following nontraumatic intracerebral hemorrhage: Secondary | ICD-10-CM | POA: Diagnosis not present

## 2019-09-22 DIAGNOSIS — I252 Old myocardial infarction: Secondary | ICD-10-CM | POA: Diagnosis not present

## 2019-09-22 DIAGNOSIS — I69122 Dysarthria following nontraumatic intracerebral hemorrhage: Secondary | ICD-10-CM | POA: Diagnosis not present

## 2019-09-22 DIAGNOSIS — I110 Hypertensive heart disease with heart failure: Secondary | ICD-10-CM | POA: Diagnosis not present

## 2019-09-22 DIAGNOSIS — R1310 Dysphagia, unspecified: Secondary | ICD-10-CM | POA: Diagnosis not present

## 2019-09-22 DIAGNOSIS — K219 Gastro-esophageal reflux disease without esophagitis: Secondary | ICD-10-CM | POA: Diagnosis not present

## 2019-09-22 DIAGNOSIS — I509 Heart failure, unspecified: Secondary | ICD-10-CM | POA: Diagnosis not present

## 2019-09-22 DIAGNOSIS — I69154 Hemiplegia and hemiparesis following nontraumatic intracerebral hemorrhage affecting left non-dominant side: Secondary | ICD-10-CM | POA: Diagnosis not present

## 2019-09-22 DIAGNOSIS — E785 Hyperlipidemia, unspecified: Secondary | ICD-10-CM | POA: Diagnosis not present

## 2019-09-22 DIAGNOSIS — I69192 Facial weakness following nontraumatic intracerebral hemorrhage: Secondary | ICD-10-CM | POA: Diagnosis not present

## 2019-09-22 DIAGNOSIS — N319 Neuromuscular dysfunction of bladder, unspecified: Secondary | ICD-10-CM | POA: Diagnosis not present

## 2019-09-22 DIAGNOSIS — I2510 Atherosclerotic heart disease of native coronary artery without angina pectoris: Secondary | ICD-10-CM | POA: Diagnosis not present

## 2019-09-23 DIAGNOSIS — N319 Neuromuscular dysfunction of bladder, unspecified: Secondary | ICD-10-CM | POA: Diagnosis not present

## 2019-09-23 DIAGNOSIS — I69192 Facial weakness following nontraumatic intracerebral hemorrhage: Secondary | ICD-10-CM | POA: Diagnosis not present

## 2019-09-23 DIAGNOSIS — K219 Gastro-esophageal reflux disease without esophagitis: Secondary | ICD-10-CM | POA: Diagnosis not present

## 2019-09-23 DIAGNOSIS — I480 Paroxysmal atrial fibrillation: Secondary | ICD-10-CM | POA: Diagnosis not present

## 2019-09-23 DIAGNOSIS — I110 Hypertensive heart disease with heart failure: Secondary | ICD-10-CM | POA: Diagnosis not present

## 2019-09-23 DIAGNOSIS — I69191 Dysphagia following nontraumatic intracerebral hemorrhage: Secondary | ICD-10-CM | POA: Diagnosis not present

## 2019-09-23 DIAGNOSIS — I509 Heart failure, unspecified: Secondary | ICD-10-CM | POA: Diagnosis not present

## 2019-09-23 DIAGNOSIS — R1310 Dysphagia, unspecified: Secondary | ICD-10-CM | POA: Diagnosis not present

## 2019-09-23 DIAGNOSIS — Z86718 Personal history of other venous thrombosis and embolism: Secondary | ICD-10-CM | POA: Diagnosis not present

## 2019-09-23 DIAGNOSIS — I2510 Atherosclerotic heart disease of native coronary artery without angina pectoris: Secondary | ICD-10-CM | POA: Diagnosis not present

## 2019-09-23 DIAGNOSIS — E785 Hyperlipidemia, unspecified: Secondary | ICD-10-CM | POA: Diagnosis not present

## 2019-09-23 DIAGNOSIS — I252 Old myocardial infarction: Secondary | ICD-10-CM | POA: Diagnosis not present

## 2019-09-23 DIAGNOSIS — I69154 Hemiplegia and hemiparesis following nontraumatic intracerebral hemorrhage affecting left non-dominant side: Secondary | ICD-10-CM | POA: Diagnosis not present

## 2019-09-23 DIAGNOSIS — I69122 Dysarthria following nontraumatic intracerebral hemorrhage: Secondary | ICD-10-CM | POA: Diagnosis not present

## 2019-09-27 DIAGNOSIS — Z86718 Personal history of other venous thrombosis and embolism: Secondary | ICD-10-CM | POA: Diagnosis not present

## 2019-09-27 DIAGNOSIS — E785 Hyperlipidemia, unspecified: Secondary | ICD-10-CM | POA: Diagnosis not present

## 2019-09-27 DIAGNOSIS — I110 Hypertensive heart disease with heart failure: Secondary | ICD-10-CM | POA: Diagnosis not present

## 2019-09-27 DIAGNOSIS — I69192 Facial weakness following nontraumatic intracerebral hemorrhage: Secondary | ICD-10-CM | POA: Diagnosis not present

## 2019-09-27 DIAGNOSIS — I509 Heart failure, unspecified: Secondary | ICD-10-CM | POA: Diagnosis not present

## 2019-09-27 DIAGNOSIS — N319 Neuromuscular dysfunction of bladder, unspecified: Secondary | ICD-10-CM | POA: Diagnosis not present

## 2019-09-27 DIAGNOSIS — K219 Gastro-esophageal reflux disease without esophagitis: Secondary | ICD-10-CM | POA: Diagnosis not present

## 2019-09-27 DIAGNOSIS — I252 Old myocardial infarction: Secondary | ICD-10-CM | POA: Diagnosis not present

## 2019-09-27 DIAGNOSIS — R1310 Dysphagia, unspecified: Secondary | ICD-10-CM | POA: Diagnosis not present

## 2019-09-27 DIAGNOSIS — I2510 Atherosclerotic heart disease of native coronary artery without angina pectoris: Secondary | ICD-10-CM | POA: Diagnosis not present

## 2019-09-27 DIAGNOSIS — I69154 Hemiplegia and hemiparesis following nontraumatic intracerebral hemorrhage affecting left non-dominant side: Secondary | ICD-10-CM | POA: Diagnosis not present

## 2019-09-27 DIAGNOSIS — I69122 Dysarthria following nontraumatic intracerebral hemorrhage: Secondary | ICD-10-CM | POA: Diagnosis not present

## 2019-09-27 DIAGNOSIS — I69191 Dysphagia following nontraumatic intracerebral hemorrhage: Secondary | ICD-10-CM | POA: Diagnosis not present

## 2019-09-27 DIAGNOSIS — I480 Paroxysmal atrial fibrillation: Secondary | ICD-10-CM | POA: Diagnosis not present

## 2019-09-28 DIAGNOSIS — I252 Old myocardial infarction: Secondary | ICD-10-CM | POA: Diagnosis not present

## 2019-09-28 DIAGNOSIS — I69192 Facial weakness following nontraumatic intracerebral hemorrhage: Secondary | ICD-10-CM | POA: Diagnosis not present

## 2019-09-28 DIAGNOSIS — I69122 Dysarthria following nontraumatic intracerebral hemorrhage: Secondary | ICD-10-CM | POA: Diagnosis not present

## 2019-09-28 DIAGNOSIS — R1310 Dysphagia, unspecified: Secondary | ICD-10-CM | POA: Diagnosis not present

## 2019-09-28 DIAGNOSIS — I69191 Dysphagia following nontraumatic intracerebral hemorrhage: Secondary | ICD-10-CM | POA: Diagnosis not present

## 2019-09-28 DIAGNOSIS — I69154 Hemiplegia and hemiparesis following nontraumatic intracerebral hemorrhage affecting left non-dominant side: Secondary | ICD-10-CM | POA: Diagnosis not present

## 2019-09-28 DIAGNOSIS — E785 Hyperlipidemia, unspecified: Secondary | ICD-10-CM | POA: Diagnosis not present

## 2019-09-28 DIAGNOSIS — K219 Gastro-esophageal reflux disease without esophagitis: Secondary | ICD-10-CM | POA: Diagnosis not present

## 2019-09-28 DIAGNOSIS — I480 Paroxysmal atrial fibrillation: Secondary | ICD-10-CM | POA: Diagnosis not present

## 2019-09-28 DIAGNOSIS — N319 Neuromuscular dysfunction of bladder, unspecified: Secondary | ICD-10-CM | POA: Diagnosis not present

## 2019-09-28 DIAGNOSIS — I2510 Atherosclerotic heart disease of native coronary artery without angina pectoris: Secondary | ICD-10-CM | POA: Diagnosis not present

## 2019-09-28 DIAGNOSIS — Z86718 Personal history of other venous thrombosis and embolism: Secondary | ICD-10-CM | POA: Diagnosis not present

## 2019-09-28 DIAGNOSIS — I509 Heart failure, unspecified: Secondary | ICD-10-CM | POA: Diagnosis not present

## 2019-09-28 DIAGNOSIS — I110 Hypertensive heart disease with heart failure: Secondary | ICD-10-CM | POA: Diagnosis not present

## 2019-09-29 DIAGNOSIS — K219 Gastro-esophageal reflux disease without esophagitis: Secondary | ICD-10-CM | POA: Diagnosis not present

## 2019-09-29 DIAGNOSIS — I480 Paroxysmal atrial fibrillation: Secondary | ICD-10-CM | POA: Diagnosis not present

## 2019-09-29 DIAGNOSIS — I69192 Facial weakness following nontraumatic intracerebral hemorrhage: Secondary | ICD-10-CM | POA: Diagnosis not present

## 2019-09-29 DIAGNOSIS — Z86718 Personal history of other venous thrombosis and embolism: Secondary | ICD-10-CM | POA: Diagnosis not present

## 2019-09-29 DIAGNOSIS — N319 Neuromuscular dysfunction of bladder, unspecified: Secondary | ICD-10-CM | POA: Diagnosis not present

## 2019-09-29 DIAGNOSIS — I509 Heart failure, unspecified: Secondary | ICD-10-CM | POA: Diagnosis not present

## 2019-09-29 DIAGNOSIS — I110 Hypertensive heart disease with heart failure: Secondary | ICD-10-CM | POA: Diagnosis not present

## 2019-09-29 DIAGNOSIS — I2510 Atherosclerotic heart disease of native coronary artery without angina pectoris: Secondary | ICD-10-CM | POA: Diagnosis not present

## 2019-09-29 DIAGNOSIS — I252 Old myocardial infarction: Secondary | ICD-10-CM | POA: Diagnosis not present

## 2019-09-29 DIAGNOSIS — I69154 Hemiplegia and hemiparesis following nontraumatic intracerebral hemorrhage affecting left non-dominant side: Secondary | ICD-10-CM | POA: Diagnosis not present

## 2019-09-29 DIAGNOSIS — I69191 Dysphagia following nontraumatic intracerebral hemorrhage: Secondary | ICD-10-CM | POA: Diagnosis not present

## 2019-09-29 DIAGNOSIS — E785 Hyperlipidemia, unspecified: Secondary | ICD-10-CM | POA: Diagnosis not present

## 2019-09-29 DIAGNOSIS — R1310 Dysphagia, unspecified: Secondary | ICD-10-CM | POA: Diagnosis not present

## 2019-09-29 DIAGNOSIS — I69122 Dysarthria following nontraumatic intracerebral hemorrhage: Secondary | ICD-10-CM | POA: Diagnosis not present

## 2019-09-30 DIAGNOSIS — I69154 Hemiplegia and hemiparesis following nontraumatic intracerebral hemorrhage affecting left non-dominant side: Secondary | ICD-10-CM | POA: Diagnosis not present

## 2019-09-30 DIAGNOSIS — I69191 Dysphagia following nontraumatic intracerebral hemorrhage: Secondary | ICD-10-CM | POA: Diagnosis not present

## 2019-09-30 DIAGNOSIS — Z86718 Personal history of other venous thrombosis and embolism: Secondary | ICD-10-CM | POA: Diagnosis not present

## 2019-09-30 DIAGNOSIS — I480 Paroxysmal atrial fibrillation: Secondary | ICD-10-CM | POA: Diagnosis not present

## 2019-09-30 DIAGNOSIS — I509 Heart failure, unspecified: Secondary | ICD-10-CM | POA: Diagnosis not present

## 2019-09-30 DIAGNOSIS — E785 Hyperlipidemia, unspecified: Secondary | ICD-10-CM | POA: Diagnosis not present

## 2019-09-30 DIAGNOSIS — I110 Hypertensive heart disease with heart failure: Secondary | ICD-10-CM | POA: Diagnosis not present

## 2019-09-30 DIAGNOSIS — I252 Old myocardial infarction: Secondary | ICD-10-CM | POA: Diagnosis not present

## 2019-09-30 DIAGNOSIS — K219 Gastro-esophageal reflux disease without esophagitis: Secondary | ICD-10-CM | POA: Diagnosis not present

## 2019-09-30 DIAGNOSIS — I2510 Atherosclerotic heart disease of native coronary artery without angina pectoris: Secondary | ICD-10-CM | POA: Diagnosis not present

## 2019-09-30 DIAGNOSIS — N319 Neuromuscular dysfunction of bladder, unspecified: Secondary | ICD-10-CM | POA: Diagnosis not present

## 2019-09-30 DIAGNOSIS — I69122 Dysarthria following nontraumatic intracerebral hemorrhage: Secondary | ICD-10-CM | POA: Diagnosis not present

## 2019-09-30 DIAGNOSIS — I69192 Facial weakness following nontraumatic intracerebral hemorrhage: Secondary | ICD-10-CM | POA: Diagnosis not present

## 2019-09-30 DIAGNOSIS — R1310 Dysphagia, unspecified: Secondary | ICD-10-CM | POA: Diagnosis not present

## 2019-10-01 ENCOUNTER — Telehealth: Payer: Self-pay

## 2019-10-01 DIAGNOSIS — I69191 Dysphagia following nontraumatic intracerebral hemorrhage: Secondary | ICD-10-CM | POA: Diagnosis not present

## 2019-10-01 DIAGNOSIS — K219 Gastro-esophageal reflux disease without esophagitis: Secondary | ICD-10-CM | POA: Diagnosis not present

## 2019-10-01 DIAGNOSIS — I69154 Hemiplegia and hemiparesis following nontraumatic intracerebral hemorrhage affecting left non-dominant side: Secondary | ICD-10-CM | POA: Diagnosis not present

## 2019-10-01 DIAGNOSIS — I252 Old myocardial infarction: Secondary | ICD-10-CM | POA: Diagnosis not present

## 2019-10-01 DIAGNOSIS — I69122 Dysarthria following nontraumatic intracerebral hemorrhage: Secondary | ICD-10-CM | POA: Diagnosis not present

## 2019-10-01 DIAGNOSIS — I69192 Facial weakness following nontraumatic intracerebral hemorrhage: Secondary | ICD-10-CM | POA: Diagnosis not present

## 2019-10-01 DIAGNOSIS — I2510 Atherosclerotic heart disease of native coronary artery without angina pectoris: Secondary | ICD-10-CM | POA: Diagnosis not present

## 2019-10-01 DIAGNOSIS — I110 Hypertensive heart disease with heart failure: Secondary | ICD-10-CM | POA: Diagnosis not present

## 2019-10-01 DIAGNOSIS — Z86718 Personal history of other venous thrombosis and embolism: Secondary | ICD-10-CM | POA: Diagnosis not present

## 2019-10-01 DIAGNOSIS — R1310 Dysphagia, unspecified: Secondary | ICD-10-CM | POA: Diagnosis not present

## 2019-10-01 DIAGNOSIS — N319 Neuromuscular dysfunction of bladder, unspecified: Secondary | ICD-10-CM | POA: Diagnosis not present

## 2019-10-01 DIAGNOSIS — E785 Hyperlipidemia, unspecified: Secondary | ICD-10-CM | POA: Diagnosis not present

## 2019-10-01 DIAGNOSIS — I480 Paroxysmal atrial fibrillation: Secondary | ICD-10-CM | POA: Diagnosis not present

## 2019-10-01 DIAGNOSIS — I509 Heart failure, unspecified: Secondary | ICD-10-CM | POA: Diagnosis not present

## 2019-10-01 NOTE — Telephone Encounter (Signed)
[name] Cruz was calling to advise [place] that his social work evaluation was being postponed to Tuesday 10-05-19.

## 2019-10-01 NOTE — Telephone Encounter (Signed)
Noted  

## 2019-10-04 DIAGNOSIS — I610 Nontraumatic intracerebral hemorrhage in hemisphere, subcortical: Secondary | ICD-10-CM | POA: Diagnosis not present

## 2019-10-04 DIAGNOSIS — I69354 Hemiplegia and hemiparesis following cerebral infarction affecting left non-dominant side: Secondary | ICD-10-CM | POA: Diagnosis not present

## 2019-10-04 NOTE — Telephone Encounter (Addendum)
Steward Drone (DPR signed) called and said pt had diarrhea 09/27/19 - 10/02/19 and Steward Drone said difficult to get pt into office; pt does not have dry mouth,abd pain, no dizziness unless gets up quickly, urinating OK and is not dark color. Steward Drone does not think dehydrated but wants diarrhea evaluated. Pt still has slight cough on and off but diarrhea is more concerning. I looked at first note on this 09/09/19 phone note and skilled nursing was approved on 09/09/19. I spoke with Harriett Sine OT with [facility] and she said she would ck with nurse mgr to see when could schedule nurse to go out and do eval. Steward Drone notified of above info and appreciative and voiced understanding. FYI to Dr [name].

## 2019-10-04 NOTE — Telephone Encounter (Signed)
Noted thank you

## 2019-10-05 DIAGNOSIS — I480 Paroxysmal atrial fibrillation: Secondary | ICD-10-CM | POA: Diagnosis not present

## 2019-10-05 DIAGNOSIS — I69154 Hemiplegia and hemiparesis following nontraumatic intracerebral hemorrhage affecting left non-dominant side: Secondary | ICD-10-CM | POA: Diagnosis not present

## 2019-10-05 DIAGNOSIS — I69122 Dysarthria following nontraumatic intracerebral hemorrhage: Secondary | ICD-10-CM | POA: Diagnosis not present

## 2019-10-05 DIAGNOSIS — R1310 Dysphagia, unspecified: Secondary | ICD-10-CM | POA: Diagnosis not present

## 2019-10-05 DIAGNOSIS — E785 Hyperlipidemia, unspecified: Secondary | ICD-10-CM | POA: Diagnosis not present

## 2019-10-05 DIAGNOSIS — I110 Hypertensive heart disease with heart failure: Secondary | ICD-10-CM | POA: Diagnosis not present

## 2019-10-05 DIAGNOSIS — I69191 Dysphagia following nontraumatic intracerebral hemorrhage: Secondary | ICD-10-CM | POA: Diagnosis not present

## 2019-10-05 DIAGNOSIS — Z86718 Personal history of other venous thrombosis and embolism: Secondary | ICD-10-CM | POA: Diagnosis not present

## 2019-10-05 DIAGNOSIS — I69192 Facial weakness following nontraumatic intracerebral hemorrhage: Secondary | ICD-10-CM | POA: Diagnosis not present

## 2019-10-05 DIAGNOSIS — N319 Neuromuscular dysfunction of bladder, unspecified: Secondary | ICD-10-CM | POA: Diagnosis not present

## 2019-10-05 DIAGNOSIS — I509 Heart failure, unspecified: Secondary | ICD-10-CM | POA: Diagnosis not present

## 2019-10-05 DIAGNOSIS — K219 Gastro-esophageal reflux disease without esophagitis: Secondary | ICD-10-CM | POA: Diagnosis not present

## 2019-10-05 DIAGNOSIS — I252 Old myocardial infarction: Secondary | ICD-10-CM | POA: Diagnosis not present

## 2019-10-05 DIAGNOSIS — I2510 Atherosclerotic heart disease of native coronary artery without angina pectoris: Secondary | ICD-10-CM | POA: Diagnosis not present

## 2019-10-07 DIAGNOSIS — I110 Hypertensive heart disease with heart failure: Secondary | ICD-10-CM | POA: Diagnosis not present

## 2019-10-07 DIAGNOSIS — I69192 Facial weakness following nontraumatic intracerebral hemorrhage: Secondary | ICD-10-CM | POA: Diagnosis not present

## 2019-10-07 DIAGNOSIS — N319 Neuromuscular dysfunction of bladder, unspecified: Secondary | ICD-10-CM | POA: Diagnosis not present

## 2019-10-07 DIAGNOSIS — E785 Hyperlipidemia, unspecified: Secondary | ICD-10-CM | POA: Diagnosis not present

## 2019-10-07 DIAGNOSIS — I480 Paroxysmal atrial fibrillation: Secondary | ICD-10-CM | POA: Diagnosis not present

## 2019-10-07 DIAGNOSIS — Z86718 Personal history of other venous thrombosis and embolism: Secondary | ICD-10-CM | POA: Diagnosis not present

## 2019-10-07 DIAGNOSIS — I69122 Dysarthria following nontraumatic intracerebral hemorrhage: Secondary | ICD-10-CM | POA: Diagnosis not present

## 2019-10-07 DIAGNOSIS — I69191 Dysphagia following nontraumatic intracerebral hemorrhage: Secondary | ICD-10-CM | POA: Diagnosis not present

## 2019-10-07 DIAGNOSIS — I69154 Hemiplegia and hemiparesis following nontraumatic intracerebral hemorrhage affecting left non-dominant side: Secondary | ICD-10-CM | POA: Diagnosis not present

## 2019-10-07 DIAGNOSIS — I509 Heart failure, unspecified: Secondary | ICD-10-CM | POA: Diagnosis not present

## 2019-10-07 DIAGNOSIS — I252 Old myocardial infarction: Secondary | ICD-10-CM | POA: Diagnosis not present

## 2019-10-07 DIAGNOSIS — I2510 Atherosclerotic heart disease of native coronary artery without angina pectoris: Secondary | ICD-10-CM | POA: Diagnosis not present

## 2019-10-07 DIAGNOSIS — K219 Gastro-esophageal reflux disease without esophagitis: Secondary | ICD-10-CM | POA: Diagnosis not present

## 2019-10-07 DIAGNOSIS — R1310 Dysphagia, unspecified: Secondary | ICD-10-CM | POA: Diagnosis not present

## 2019-10-07 NOTE — Telephone Encounter (Signed)
[name] called back letting us know that the social work has to be delayed until next Thursday 10/14/19 due to families work schedules. This is just an FYI but she said we can call her with any questions at [phone]

## 2019-10-08 ENCOUNTER — Telehealth: Payer: Self-pay | Admitting: *Deleted

## 2019-10-08 DIAGNOSIS — R1310 Dysphagia, unspecified: Secondary | ICD-10-CM | POA: Diagnosis not present

## 2019-10-08 DIAGNOSIS — N319 Neuromuscular dysfunction of bladder, unspecified: Secondary | ICD-10-CM | POA: Diagnosis not present

## 2019-10-08 DIAGNOSIS — I69154 Hemiplegia and hemiparesis following nontraumatic intracerebral hemorrhage affecting left non-dominant side: Secondary | ICD-10-CM | POA: Diagnosis not present

## 2019-10-08 DIAGNOSIS — K219 Gastro-esophageal reflux disease without esophagitis: Secondary | ICD-10-CM | POA: Diagnosis not present

## 2019-10-08 DIAGNOSIS — I480 Paroxysmal atrial fibrillation: Secondary | ICD-10-CM | POA: Diagnosis not present

## 2019-10-08 DIAGNOSIS — I69191 Dysphagia following nontraumatic intracerebral hemorrhage: Secondary | ICD-10-CM | POA: Diagnosis not present

## 2019-10-08 DIAGNOSIS — I509 Heart failure, unspecified: Secondary | ICD-10-CM | POA: Diagnosis not present

## 2019-10-08 DIAGNOSIS — Z86718 Personal history of other venous thrombosis and embolism: Secondary | ICD-10-CM | POA: Diagnosis not present

## 2019-10-08 DIAGNOSIS — I2510 Atherosclerotic heart disease of native coronary artery without angina pectoris: Secondary | ICD-10-CM | POA: Diagnosis not present

## 2019-10-08 DIAGNOSIS — I69192 Facial weakness following nontraumatic intracerebral hemorrhage: Secondary | ICD-10-CM | POA: Diagnosis not present

## 2019-10-08 DIAGNOSIS — I69122 Dysarthria following nontraumatic intracerebral hemorrhage: Secondary | ICD-10-CM | POA: Diagnosis not present

## 2019-10-08 DIAGNOSIS — I110 Hypertensive heart disease with heart failure: Secondary | ICD-10-CM | POA: Diagnosis not present

## 2019-10-08 DIAGNOSIS — I252 Old myocardial infarction: Secondary | ICD-10-CM | POA: Diagnosis not present

## 2019-10-08 DIAGNOSIS — E785 Hyperlipidemia, unspecified: Secondary | ICD-10-CM | POA: Diagnosis not present

## 2019-10-08 NOTE — Telephone Encounter (Signed)
I spoke with pts sister, Steward Drone [facility] signed) and pt has had diarrhea for 2 weeks; Steward Drone said someone called her from [facility] several days ago and said there was an order for skilled nursing but they are busy and behind; Steward Drone said no one has called back or came out in re to nursing. I spoke with Morrie Sheldon at [facility] and she saw the note where Steward Drone with [facility] had spoke with Agustin Cree the office mgr but no outcome was listed. Darlene office mgr is on phone now with someone but Morrie Sheldon said Agustin Cree will call Steward Drone with update on nurse coming out today. Steward Drone will cb with update about when nurse will come out; pt has had basically watery diarrhea for 2 wks. Initially could see what Steward Drone thought was mucus but not now. No blood or mucus seen. Pt is drinking a lot and Steward Drone does not think pt is dehydrated. Steward Drone gives pt 2 immodium daily when has diarrhea and this seems to slow diarrhea but next day diarrhea startes all over again. [facility] [address] Steward Drone request cb after Dr [name] reviews.

## 2019-10-08 NOTE — Telephone Encounter (Signed)
[name] Cruz with [facility] had left a v/m that they do not have orders for nursing and will need orders from DR Sharen Hones; after orders they may be able to send out nurse this weekend or 1st of the week. I called [facility] and Agustin Cree is in a meeting and per [place] [facility] [facility] should be out of meeting in approx 30'. I asked if Agustin Cree could come out of meeting to discuss this since this has been going on for a while and I do not want any miscommunications. Morrie Sheldon said she would send me to [name] Cruz's v/m and [name] Cruz should call me back soon.

## 2019-10-08 NOTE — Telephone Encounter (Signed)
error 

## 2019-10-08 NOTE — Telephone Encounter (Signed)
I called Darlene again at [facility]; I spoke with Agustin Cree and she did not know where the confusion of the skilled nursing [facility] order came in at but Agustin Cree said she would put the orders in now from Dr [name]. I advised Darlene at [facility] of Dr Cephas Darby orders from this note 10/08/19 at 2:08 pm and Agustin Cree voiced understanding and said that she is putting pt on clinicians schedule 10/09/19 and the Clinician will contact Steward Drone about time to come out and gets labs and vitals. K spoke with Steward Drone pts sister and she is aware of plan and will wait to hear from clinician about time to come to see pt. FYI to Dr [name].

## 2019-10-08 NOTE — Telephone Encounter (Addendum)
Agree with pushing fluids and imodium use.  Would want to send C diff testing or preferably GI pathogen panel if available ASAP for further evaluation of ongoing watery diarrhea. Would also want labs drawn - CBC, CMP and a set of vitals

## 2019-10-09 ENCOUNTER — Emergency Department (HOSPITAL_COMMUNITY)
Admission: EM | Admit: 2019-10-09 | Discharge: 2019-10-09 | Disposition: A | Discharge status: Home / Self Care | Payer: Medicare Other | Attending: Emergency Medicine | Admitting: Emergency Medicine

## 2019-10-09 ENCOUNTER — Encounter (HOSPITAL_COMMUNITY): Payer: Self-pay | Admitting: Emergency Medicine

## 2019-10-09 ENCOUNTER — Emergency Department (HOSPITAL_COMMUNITY): Payer: Medicare Other

## 2019-10-09 ENCOUNTER — Other Ambulatory Visit: Payer: Self-pay

## 2019-10-09 DIAGNOSIS — I2510 Atherosclerotic heart disease of native coronary artery without angina pectoris: Secondary | ICD-10-CM | POA: Diagnosis not present

## 2019-10-09 DIAGNOSIS — I10 Essential (primary) hypertension: Secondary | ICD-10-CM | POA: Diagnosis not present

## 2019-10-09 DIAGNOSIS — I69122 Dysarthria following nontraumatic intracerebral hemorrhage: Secondary | ICD-10-CM | POA: Diagnosis not present

## 2019-10-09 DIAGNOSIS — I499 Cardiac arrhythmia, unspecified: Secondary | ICD-10-CM | POA: Diagnosis not present

## 2019-10-09 DIAGNOSIS — R1310 Dysphagia, unspecified: Secondary | ICD-10-CM | POA: Diagnosis not present

## 2019-10-09 DIAGNOSIS — R05 Cough: Secondary | ICD-10-CM | POA: Diagnosis not present

## 2019-10-09 DIAGNOSIS — Z20822 Contact with and (suspected) exposure to covid-19: Secondary | ICD-10-CM | POA: Diagnosis not present

## 2019-10-09 DIAGNOSIS — Z79899 Other long term (current) drug therapy: Secondary | ICD-10-CM | POA: Diagnosis not present

## 2019-10-09 DIAGNOSIS — I110 Hypertensive heart disease with heart failure: Secondary | ICD-10-CM | POA: Diagnosis not present

## 2019-10-09 DIAGNOSIS — I69192 Facial weakness following nontraumatic intracerebral hemorrhage: Secondary | ICD-10-CM | POA: Diagnosis not present

## 2019-10-09 DIAGNOSIS — Z86718 Personal history of other venous thrombosis and embolism: Secondary | ICD-10-CM | POA: Diagnosis not present

## 2019-10-09 DIAGNOSIS — I471 Supraventricular tachycardia: Secondary | ICD-10-CM | POA: Diagnosis not present

## 2019-10-09 DIAGNOSIS — I69191 Dysphagia following nontraumatic intracerebral hemorrhage: Secondary | ICD-10-CM | POA: Diagnosis not present

## 2019-10-09 DIAGNOSIS — Z951 Presence of aortocoronary bypass graft: Secondary | ICD-10-CM | POA: Diagnosis not present

## 2019-10-09 DIAGNOSIS — Z8673 Personal history of transient ischemic attack (TIA), and cerebral infarction without residual deficits: Secondary | ICD-10-CM | POA: Insufficient documentation

## 2019-10-09 DIAGNOSIS — I252 Old myocardial infarction: Secondary | ICD-10-CM | POA: Diagnosis not present

## 2019-10-09 DIAGNOSIS — K219 Gastro-esophageal reflux disease without esophagitis: Secondary | ICD-10-CM | POA: Diagnosis not present

## 2019-10-09 DIAGNOSIS — I509 Heart failure, unspecified: Secondary | ICD-10-CM | POA: Diagnosis not present

## 2019-10-09 DIAGNOSIS — R197 Diarrhea, unspecified: Secondary | ICD-10-CM | POA: Diagnosis not present

## 2019-10-09 DIAGNOSIS — Z743 Need for continuous supervision: Secondary | ICD-10-CM | POA: Diagnosis not present

## 2019-10-09 DIAGNOSIS — I69154 Hemiplegia and hemiparesis following nontraumatic intracerebral hemorrhage affecting left non-dominant side: Secondary | ICD-10-CM | POA: Diagnosis not present

## 2019-10-09 DIAGNOSIS — E785 Hyperlipidemia, unspecified: Secondary | ICD-10-CM | POA: Diagnosis not present

## 2019-10-09 DIAGNOSIS — N319 Neuromuscular dysfunction of bladder, unspecified: Secondary | ICD-10-CM | POA: Diagnosis not present

## 2019-10-09 DIAGNOSIS — R1110 Vomiting, unspecified: Secondary | ICD-10-CM | POA: Diagnosis not present

## 2019-10-09 DIAGNOSIS — I480 Paroxysmal atrial fibrillation: Secondary | ICD-10-CM | POA: Diagnosis not present

## 2019-10-09 LAB — COMPREHENSIVE METABOLIC PANEL
ALT: 23 U/L (ref 0–44)
AST: 25 U/L (ref 15–41)
Albumin: 3.1 g/dL — ABNORMAL LOW (ref 3.5–5.0)
Alkaline Phosphatase: 65 U/L (ref 38–126)
Anion gap: 10 (ref 5–15)
BUN: 7 mg/dL — ABNORMAL LOW (ref 8–23)
CO2: 29 mmol/L (ref 22–32)
Calcium: 9 mg/dL (ref 8.9–10.3)
Chloride: 101 mmol/L (ref 98–111)
Creatinine, Ser: 0.55 mg/dL — ABNORMAL LOW (ref 0.61–1.24)
GFR calc Af Amer: >60 mL/min (ref >60)
GFR calc non Af Amer: >60 mL/min (ref >60)
Glucose, Bld: 104 mg/dL — ABNORMAL HIGH (ref 70–99)
Potassium: 3.3 mmol/L — ABNORMAL LOW (ref 3.5–5.1)
Sodium: 140 mmol/L (ref 135–145)
Total Bilirubin: 0.3 mg/dL (ref 0.3–1.2)
Total Protein: 5.7 g/dL — ABNORMAL LOW (ref 6.5–8.1)

## 2019-10-09 LAB — CBC
HCT: 40.2 % (ref 39.0–52.0)
Hemoglobin: 12.9 g/dL — ABNORMAL LOW (ref 13.0–17.0)
MCH: 30.1 pg (ref 26.0–34.0)
MCHC: 32.1 g/dL (ref 30.0–36.0)
MCV: 93.7 fL (ref 80.0–100.0)
Platelets: 227 10*3/uL (ref 150–400)
RBC: 4.29 MIL/uL (ref 4.22–5.81)
RDW: 13.9 % (ref 11.5–15.5)
WBC: 8.7 10*3/uL (ref 4.0–10.5)
nRBC: 0 % (ref 0.0–0.2)

## 2019-10-09 LAB — RESPIRATORY PANEL BY RT PCR (FLU A&B, COVID)
Influenza A by PCR: NEGATIVE
Influenza B by PCR: NEGATIVE
SARS Coronavirus 2 by RT PCR: NEGATIVE

## 2019-10-09 LAB — LIPASE, BLOOD: Lipase: 21 U/L (ref 11–51)

## 2019-10-09 MED ORDER — POTASSIUM CHLORIDE ER 20 MEQ PO TBCR
20.0000 meq | EXTENDED_RELEASE_TABLET | Freq: Every day | ORAL | 0 refills | Status: DC
Start: 2019-10-09 — End: 2019-10-09

## 2019-10-09 MED ORDER — SODIUM CHLORIDE 0.9 % IV BOLUS
1000.0000 mL | Freq: Once | INTRAVENOUS | Status: AC
  Administered 2019-10-09: 1000 mL via INTRAVENOUS

## 2019-10-09 MED ORDER — POTASSIUM CHLORIDE 20 MEQ/15ML (10%) PO SOLN
20.0000 meq | Freq: Once | ORAL | Status: AC
  Administered 2019-10-09: 20 meq via ORAL
  Filled 2019-10-09: qty 15

## 2019-10-09 MED ORDER — PEPTO-BISMOL 524 MG/30ML PO SUSP
30.0000 mL | Freq: Four times a day (QID) | ORAL | 0 refills | Status: DC | PRN
Start: 2019-10-09 — End: 2019-10-22

## 2019-10-09 MED ORDER — POTASSIUM CHLORIDE ER 20 MEQ PO TBCR: 20.0000 meq | EXTENDED_RELEASE_TABLET | Freq: Every day | ORAL | 0 refills | Status: DC

## 2019-10-09 MED ORDER — POTASSIUM CHLORIDE ER 20 MEQ PO TBCR
20.0000 meq | EXTENDED_RELEASE_TABLET | Freq: Every day | ORAL | 0 refills | Status: DC
Start: 2019-10-09 — End: 2019-10-22

## 2019-10-09 NOTE — ED Triage Notes (Signed)
EMS stated, he has had diarrhea for 3 weeks and has not had any outside medical help.

## 2019-10-09 NOTE — Progress Notes (Signed)
Attempt to place PIV unsuccessful.  Attempt x 2 with ultrasound.  Very poor veins.

## 2019-10-09 NOTE — Discharge Instructions (Addendum)
Today your labs were overall reassuring.  Your potassium was slightly low.  Please try eating a banana every day for the next week.

## 2019-10-09 NOTE — ED Provider Notes (Signed)
Angiocath insertion Performed by: Tilden Fossa  Consent: Verbal consent obtained. Risks and benefits: risks, benefits and alternatives were discussed Time out: Immediately prior to procedure a "time out" was called to verify the correct patient, procedure, equipment, support staff and site/side marked as required.  Preparation: Patient was prepped and draped in the usual sterile fashion.  Vein Location: right AC  Ultrasound Guided  Gauge: 20  Normal blood return and flush without difficulty Patient tolerance: Patient tolerated the procedure well with no immediate complications.      Tilden Fossa, MD 10/09/19 [phone]

## 2019-10-09 NOTE — ED Provider Notes (Signed)
[facility] Provider [facility]   CSN: [id number] Arrival date & time: 10/09/19  1437     History Chief Complaint  Patient presents with  . Diarrhea    [name] Cruz is a 64 y.o. male with a past medical history of multiple strokes, NSTEMI, hypertension, hyperlipidemia, cognitive impairment, left-sided hemiparesis, who presents today for evaluation of diarrhea.  Patient is unable to give reliable or accurate history due to his cognitive deficits.   I spoke with [name] Cruz, patient's sister.  She reports that over the past 2 weeks he has had cough and diarrhea.  He is not vaccinated against Covid.  He gets home PT OT and speech.  She reports his baseline is mentally where he is currently which is not oriented to time.  She denies any known fevers.  No known blood in his stool.  No known covid contacts.   Level 5 caveat cognitive impairment, patient is unable to give reliable history.  HPI     Past Medical History:  Diagnosis Date  . Acute deep vein thrombosis (DVT) of left upper extremity (HCC)    L brachial and basilic veins, eliquis started 08/22/2017 to continue for 3 months total   . Cognitive impairment 2007   after stroke, saw rehab but told to stop because was too upsetting to him  . History of chicken pox   . HLD (hyperlipidemia)   . HTN (hypertension)   . NSTEMI (non-ST elevated myocardial infarction) (HCC) 02/21/2017  . Obesity   . Stroke, hemorrhagic (HCC) 2007   thought 2/2 HTN (240sbp); residual cognitive impairment, loss of R peripheral field, no driving    Patient Active Problem List   Diagnosis Date Noted  . SAH (subarachnoid hemorrhage) (HCC)   . Neurogenic bladder   . Benign essential HTN   . Labile blood pressure   . Hemiparesis affecting left side as late effect of stroke (HCC)   . Thalamic hemorrhage (HCC) 07/26/2019  . ICH (intracerebral hemorrhage) (HCC) 07/19/2019  . History of DVT in adulthood 02/21/2019  . PAD  (peripheral artery disease) (HCC) 12/14/2018  . Agitation 09/04/2018  . Acute CVA (cerebrovascular accident) (HCC) 08/07/2018  . Cerebellar stroke, acute (HCC) 07/10/2018  . Status epilepticus (HCC) 03/25/2018  . Unilateral occipital headache 10/03/2017  . Ischemic stroke (HCC) 08/23/2017  . Coronary artery disease involving coronary bypass graft of native heart without angina pectoris   . Atrial fibrillation (HCC)   . Dysphagia, post-stroke   . Cardiac arrest (HCC) 08/09/2017  . S/P CABG x 3 02/24/2017  . NSTEMI (non-ST elevated myocardial infarction) (HCC) 02/21/2017  . Encephalomalacia 10/28/2012  . Seizure disorder (HCC) 04/26/2012  . Alcohol abuse 04/26/2012  . History of stroke with current residual effects   . Cognitive impairment   . HLD (hyperlipidemia)   . HTN (hypertension)     Past Surgical History:  Procedure Laterality Date  . ANKLE SURGERY  1990s   right foot with plate and screws  . CORONARY ARTERY BYPASS GRAFT N/A 02/24/2017   3v Procedure: CORONARY ARTERY BYPASS GRAFTING (CABG) x 3 ON PUMP USING LEFT INTERNAL MAMMARY ARTERY TO LEFT ANTERIOR DESENDING CORNARY ARTERY, RIGHT GREATER SAPHENOUS VEIN TO LEFT CIRCUMFLEX ARTERY AND POSTERIOR DESENDING ARTERY. RIGHT GREATER SAPHENOUS VEIN OBTAINED VIA ENDOVEIN HARVEST.;  Surgeon: Delight OvensGerhardt, Edward B, MD  . IABP INSERTION N/A 02/21/2017   Procedure: IABP Insertion;  Surgeon: Yvonne KendallEnd, Christopher, MD;  Location: [facility] INVASIVE CV LAB;  Service: Cardiovascular;  Laterality: N/A;  .  LEFT HEART CATH AND CORONARY ANGIOGRAPHY N/A 02/21/2017   Procedure: LEFT HEART CATH AND CORONARY ANGIOGRAPHY;  Surgeon: Yvonne Kendall, MD;  Location: [facility] INVASIVE CV LAB;  Service: Cardiovascular;  Laterality: N/A;  . TEE WITHOUT CARDIOVERSION N/A 02/24/2017   Procedure: TRANSESOPHAGEAL ECHOCARDIOGRAM (TEE);  Surgeon: Delight Ovens, MD;  Location: [facility] OR;  Service: Open Heart Surgery;  Laterality: N/A;       Family History  Problem Relation Age of  Onset  . Alzheimer's disease Maternal Grandfather   . Cancer Mother        lymphoma  . Alcohol abuse Father        smoker  . Coronary artery disease Neg Hx   . Stroke Neg Hx   . Diabetes Neg Hx     Social History   Tobacco Use  . Smoking status: Never Smoker  . Smokeless tobacco: Former Clinical biochemist  . Vaping Use: Never used  Substance Use Topics  . Alcohol use: Not Currently  . Drug use: No    Home Medications Prior to Admission medications   Medication Sig Start Date End Date Taking? Authorizing Provider  acetaminophen (TYLENOL) 325 MG tablet Take 2 tablets (650 mg total) by mouth every 4 (four) hours as needed for mild pain. 08/16/19   Love, Evlyn Kanner, PA-C  atorvastatin (LIPITOR) 80 MG tablet TAKE 1 TABLET BY MOUTH EVERY DAY 09/08/19   [name], MD  bismuth subsalicylate (PEPTO-BISMOL) 262 MG/15ML suspension Take 30 mLs by mouth every 6 (six) hours as needed for diarrhea or loose stools. 10/09/19   Cristina Gong, PA-C  divalproex (DEPAKOTE) 500 MG DR tablet Take 1 tablet (500 mg total) by mouth 2 (two) times daily. 06/27/19 09/25/19  Dorcas Carrow, MD  Lacosamide (VIMPAT) 100 MG TABS Take 200 mg at 8 am and 200 mg at 10 pm daily. 08/16/19   Love, Evlyn Kanner, PA-C  levETIRAcetam (KEPPRA) 750 MG tablet Take 2 tablets (1,500 mg total) by mouth 2 (two) times daily. 09/21/19   Ihor Austin, NP  Multiple Vitamin (MULTIVITAMIN WITH MINERALS) TABS tablet Take 1 tablet by mouth daily. 12/26/17   [name], MD  pantoprazole (PROTONIX) 40 MG tablet Take 1 tablet (40 mg total) by mouth at bedtime. 07/26/19   Briant Cedar, MD  Potassium Chloride ER 20 MEQ TBCR Take 20 mEq by mouth daily for 5 doses. 10/09/19 10/14/19  Cristina Gong, PA-C  protective barrier (RESTORE) CREA Apply 1 application topically 2 (two) times daily. And prn after pericare to sacrum/buttocks. 08/17/19   Love, Evlyn Kanner, PA-C  sertraline (ZOLOFT) 25 MG tablet TAKE 1 TABLET BY MOUTH EVERY  DAY Patient taking differently: Take 25 mg by mouth daily.  04/26/19   [name], MD  tamsulosin (FLOMAX) 0.4 MG CAPS capsule Take 1 capsule (0.4 mg total) by mouth daily after supper. 08/17/19   Love, Evlyn Kanner, PA-C  ziprasidone (GEODON) 40 MG capsule TAKE 1 CAPSULE (40 MG TOTAL) BY MOUTH AT BEDTIME. 09/22/19   [name], MD    Allergies    Losartan  Review of Systems   Review of Systems  Unable to perform ROS: Dementia    Physical Exam Updated Vital Signs BP (!) 167/90   Pulse 71   Temp 98.5 F (36.9 C) (Oral)   Resp 12   Ht 5\' 3"  (1.6 m)   Wt 78.3 kg   SpO2 100%   BMI 30.58 kg/m   Physical Exam Vitals and nursing note reviewed.  Constitutional:      General: He is not in acute distress.    Appearance: He is not ill-appearing.  Eyes:     Conjunctiva/sclera: Conjunctivae normal.  Cardiovascular:     Rate and Rhythm: Normal rate.  Pulmonary:     Effort: Pulmonary effort is normal. No respiratory distress.  Abdominal:     General: Abdomen is flat.     Palpations: Abdomen is soft.     Tenderness: There is no abdominal tenderness. There is no guarding or rebound.  Musculoskeletal:     Cervical back: Normal range of motion and neck supple.  Skin:    General: Skin is warm and dry.  Neurological:     Mental Status: He is alert. Mental status is at baseline.     Comments: Awake and alert, patient is oriented to person, place, not to time.  He is unable to provide reliable history, given information that contradicts what his sister reports.  Significant weakness on the left side, which according to sister is at baseline.  His speech is not slurred.  Psychiatric:        Mood and Affect: Mood normal.        Behavior: Behavior normal.     ED Results / Procedures / Treatments   Labs (all labs ordered are listed, but only abnormal results are displayed) Labs Reviewed  COMPREHENSIVE METABOLIC PANEL - Abnormal; Notable for the following components:      Result  Value   Potassium 3.3 (*)    Glucose, Bld 104 (*)    BUN 7 (*)    Creatinine, Ser 0.55 (*)    Total Protein 5.7 (*)    Albumin 3.1 (*)    All other components within normal limits  CBC - Abnormal; Notable for the following components:   Hemoglobin 12.9 (*)    All other components within normal limits  RESPIRATORY PANEL BY RT PCR (FLU A&B, COVID)  GASTROINTESTINAL PANEL BY PCR, STOOL (REPLACES STOOL CULTURE)  C DIFFICILE QUICK SCREEN W PCR REFLEX  LIPASE, BLOOD  URINALYSIS, ROUTINE W REFLEX MICROSCOPIC    EKG EKG Interpretation  Date/Time:  Saturday October 09 2019 19:03:24 EDT Ventricular Rate:  66 PR Interval:    QRS Duration: 88 QT Interval:  398 QTC Calculation: 417 R Axis:   -25 Text Interpretation: Sinus rhythm Borderline left axis deviation Probable anteroseptal infarct, old No STEMI Confirmed by Alvester Chou [phone]) on 10/09/2019 7:05:19 PM   Radiology DG Chest Port 1 View  Result Date: 10/09/2019 CLINICAL DATA:  Cough EXAM: PORTABLE CHEST 1 VIEW COMPARISON:  July 24, 2019 FINDINGS: The patient is status post prior median sternotomy. The heart size is enlarged. There is no focal infiltrate or large pleural effusion. There is no pneumothorax. There is no acute osseous abnormality. IMPRESSION: 1. Cardiomegaly without edema or large pleural effusion. 2. No acute cardiopulmonary process. Electronically Signed   By: Katherine Mantle M.D.   On: 10/09/2019 17:50    Procedures Procedures (including critical care time)  Medications Ordered in ED Medications  sodium chloride 0.9 % bolus 1,000 mL (0 mLs Intravenous Stopped 10/09/19 2236)  potassium chloride 20 MEQ/15ML (10%) solution 20 mEq (20 mEq Oral Given 10/09/19 2210)    ED Course  I have reviewed the triage vital signs and the nursing notes.  Pertinent labs & imaging results that were available during my care of the patient were reviewed by me and considered in my medical decision making (see chart for  details).  MDM Rules/Calculators/A&P                         Patient is a 64 year old man who presents today for evaluation of diarrhea.  History is primarily obtained from his sister who is his healthcare power of attorney as he has cognitive deficits after multiple recent strokes.  Primarily patient is here for diarrhea over the past 2 weeks with occasional cough.  Here he is afebrile, not tachycardic or tachypneic and not hypotensive.  CBC is unremarkable specifically no leukocytosis.  CMP shows potassium of 3.3, this is most likely secondary to diarrhea and p.o. replacement is ordered.  He is discharged with a short course of p.o. potassium to supplement at home.  Covid testing is negative.  Chest x-ray is obtained showing cardiomegaly without pulmonary edema.  Here in the emergency room for over 8 hours he did not have a single episode of diarrhea, therefore unable to send for stool studies, however he is sent home with a collection container.  Twelve-lead without evidence of ischemia.    His abdomen is soft, nontender nondistended, doubt surgical abdomen especially with reassuring vitals and lack of leukocytosis.  I spoke with his sister.  Recommended pepto prn diarrhea. Recommended outpatient follow-up.  Return precautions were discussed with sister/HCPOA who states their understanding.  At the time of discharge they denied any unaddressed complaints or concerns.  They are agreeable for discharge home.  He has PT, OT, and SLP already at home and will be getting [facility] RN soon.   Note: Portions of this report may have been transcribed using voice recognition software. Every effort was made to ensure accuracy; however, inadvertent computerized transcription errors may be present  Final Clinical Impression(s) / ED Diagnoses Final diagnoses:  Diarrhea, unspecified type    Rx / DC Orders ED Discharge Orders         Ordered    bismuth subsalicylate (PEPTO-BISMOL) 262 MG/15ML suspension   Every 6 hours PRN        10/09/19 2152    potassium chloride 20 MEQ TBCR  Daily,   Status:  Discontinued        10/09/19 2211    Potassium Chloride ER 20 MEQ TBCR  Daily,   Status:  Discontinued        10/09/19 2213    Potassium Chloride ER 20 MEQ TBCR  Daily,   Status:  Discontinued        10/09/19 2214    Potassium Chloride ER 20 MEQ TBCR  Daily        10/09/19 2215           Cristina Gong, PA-C 10/09/19 2317    Terald Sleeper, MD 10/10/19 [phone]

## 2019-10-09 NOTE — ED Notes (Signed)
IV access attempted x 4 no success, provider notified, IV team consulted.

## 2019-10-09 NOTE — ED Notes (Signed)
Pt is incontinent for urine, pt cleaned and no stool present when changed.

## 2019-10-11 ENCOUNTER — Telehealth: Payer: Self-pay | Admitting: *Deleted

## 2019-10-11 ENCOUNTER — Other Ambulatory Visit: Payer: Medicare Other

## 2019-10-11 ENCOUNTER — Encounter: Payer: Self-pay | Admitting: Family Medicine

## 2019-10-11 DIAGNOSIS — I69191 Dysphagia following nontraumatic intracerebral hemorrhage: Secondary | ICD-10-CM | POA: Diagnosis not present

## 2019-10-11 DIAGNOSIS — I110 Hypertensive heart disease with heart failure: Secondary | ICD-10-CM | POA: Diagnosis not present

## 2019-10-11 DIAGNOSIS — I480 Paroxysmal atrial fibrillation: Secondary | ICD-10-CM | POA: Diagnosis not present

## 2019-10-11 DIAGNOSIS — I252 Old myocardial infarction: Secondary | ICD-10-CM | POA: Diagnosis not present

## 2019-10-11 DIAGNOSIS — I2510 Atherosclerotic heart disease of native coronary artery without angina pectoris: Secondary | ICD-10-CM | POA: Diagnosis not present

## 2019-10-11 DIAGNOSIS — I69192 Facial weakness following nontraumatic intracerebral hemorrhage: Secondary | ICD-10-CM | POA: Diagnosis not present

## 2019-10-11 DIAGNOSIS — E785 Hyperlipidemia, unspecified: Secondary | ICD-10-CM | POA: Diagnosis not present

## 2019-10-11 DIAGNOSIS — I509 Heart failure, unspecified: Secondary | ICD-10-CM | POA: Diagnosis not present

## 2019-10-11 DIAGNOSIS — I69154 Hemiplegia and hemiparesis following nontraumatic intracerebral hemorrhage affecting left non-dominant side: Secondary | ICD-10-CM | POA: Diagnosis not present

## 2019-10-11 DIAGNOSIS — K219 Gastro-esophageal reflux disease without esophagitis: Secondary | ICD-10-CM | POA: Diagnosis not present

## 2019-10-11 DIAGNOSIS — I69122 Dysarthria following nontraumatic intracerebral hemorrhage: Secondary | ICD-10-CM | POA: Diagnosis not present

## 2019-10-11 DIAGNOSIS — R197 Diarrhea, unspecified: Secondary | ICD-10-CM

## 2019-10-11 DIAGNOSIS — R1310 Dysphagia, unspecified: Secondary | ICD-10-CM | POA: Diagnosis not present

## 2019-10-11 DIAGNOSIS — N319 Neuromuscular dysfunction of bladder, unspecified: Secondary | ICD-10-CM | POA: Diagnosis not present

## 2019-10-11 DIAGNOSIS — Z86718 Personal history of other venous thrombosis and embolism: Secondary | ICD-10-CM | POA: Diagnosis not present

## 2019-10-11 MED ORDER — CIPROFLOXACIN HCL 500 MG PO TABS
500.0000 mg | ORAL_TABLET | Freq: Two times a day (BID) | ORAL | 0 refills | Status: DC
Start: 2019-10-11 — End: 2019-10-22

## 2019-10-11 NOTE — Addendum Note (Signed)
Addended by: [name] on: 10/11/2019 04:33 PM   Modules accepted: Orders

## 2019-10-11 NOTE — Telephone Encounter (Signed)
Agree with this. Thank you.  

## 2019-10-11 NOTE — Telephone Encounter (Signed)
We just sent test results in today - and it will take several days for test to be run. The collected stool was a scant amount so we are seeing if they will even run with that small amt of stool.   plz triage patient to get details on symptoms as well as a set of vital signs. Is he tolerating PO? How often is he having diarrhea?

## 2019-10-11 NOTE — Telephone Encounter (Signed)
[name] Cruz speech therapist with [facility] left a voicemail stating that they have been working with the patient for about a month. [name] Cruz stated that they are requesting to continue speech therapy for once a week for 3 weeks. [name] Cruz stated that it is okay to leave the orders on voicemail when calling back.

## 2019-10-11 NOTE — Telephone Encounter (Signed)
Lvm asking Alden to call back.  Need to inform her Dr. G is giving verbal orders for services requested for pt.  

## 2019-10-11 NOTE — Telephone Encounter (Signed)
Patient's sister Steward Drone left a voicemail wanting to know if the results are back from the specimen this morning. Steward Drone stated that her brother's diarrhea is real bad and he is not doing very well.

## 2019-10-11 NOTE — Telephone Encounter (Signed)
Patient's sister Steward Drone notified as instructed by telephone and verbalized understanding.

## 2019-10-11 NOTE — Telephone Encounter (Signed)
Patient's sister Steward Drone left a voicemail stating that her brother's blood pressure is 126/73, heart rate 64 and temperature 98.0.

## 2019-10-11 NOTE — Telephone Encounter (Signed)
Patient's sister Steward Drone notified as instructed by telephone. Steward Drone stated that she is at work until 3:00 pm and will head over to check his vital signs when she gets off from work and call the office back. Steward Drone stated that her brother is drinking and eating a regular diet without any problems. Steward Drone stated that he is drinking water, orange juice, Dr. Reino Kent and sweet teal. Steward Drone stated that it seems as soon as he eats or drinks it comes right out. Steward Drone stated that he having diarrhea about every 10 minutes or so and her sister has changed him about 8 times today since 6:30 am. Steward Drone stated that they were told at the ED to stop the imodium and switch over to [facility] bismol and he is going thru that pretty fast.

## 2019-10-11 NOTE — Telephone Encounter (Signed)
Thank you. Will awaiting stool studies.  Recommend treating empirically with cipro 500mg  twice daily for 3 days - sent to pharmacy.  Recommend bland diet and clearer liquids when drinking (ginger ale, gatorade, jello) - avoid dark sodas and sweet tea.

## 2019-10-12 DIAGNOSIS — I69122 Dysarthria following nontraumatic intracerebral hemorrhage: Secondary | ICD-10-CM | POA: Diagnosis not present

## 2019-10-12 DIAGNOSIS — N319 Neuromuscular dysfunction of bladder, unspecified: Secondary | ICD-10-CM | POA: Diagnosis not present

## 2019-10-12 DIAGNOSIS — I69192 Facial weakness following nontraumatic intracerebral hemorrhage: Secondary | ICD-10-CM | POA: Diagnosis not present

## 2019-10-12 DIAGNOSIS — I509 Heart failure, unspecified: Secondary | ICD-10-CM | POA: Diagnosis not present

## 2019-10-12 DIAGNOSIS — K219 Gastro-esophageal reflux disease without esophagitis: Secondary | ICD-10-CM | POA: Diagnosis not present

## 2019-10-12 DIAGNOSIS — I69191 Dysphagia following nontraumatic intracerebral hemorrhage: Secondary | ICD-10-CM | POA: Diagnosis not present

## 2019-10-12 DIAGNOSIS — I69154 Hemiplegia and hemiparesis following nontraumatic intracerebral hemorrhage affecting left non-dominant side: Secondary | ICD-10-CM | POA: Diagnosis not present

## 2019-10-12 DIAGNOSIS — R1310 Dysphagia, unspecified: Secondary | ICD-10-CM | POA: Diagnosis not present

## 2019-10-12 DIAGNOSIS — I252 Old myocardial infarction: Secondary | ICD-10-CM | POA: Diagnosis not present

## 2019-10-12 DIAGNOSIS — Z86718 Personal history of other venous thrombosis and embolism: Secondary | ICD-10-CM | POA: Diagnosis not present

## 2019-10-12 DIAGNOSIS — I110 Hypertensive heart disease with heart failure: Secondary | ICD-10-CM | POA: Diagnosis not present

## 2019-10-12 DIAGNOSIS — I2510 Atherosclerotic heart disease of native coronary artery without angina pectoris: Secondary | ICD-10-CM | POA: Diagnosis not present

## 2019-10-12 DIAGNOSIS — I480 Paroxysmal atrial fibrillation: Secondary | ICD-10-CM | POA: Diagnosis not present

## 2019-10-12 DIAGNOSIS — E785 Hyperlipidemia, unspecified: Secondary | ICD-10-CM | POA: Diagnosis not present

## 2019-10-13 ENCOUNTER — Telehealth: Payer: Self-pay | Admitting: *Deleted

## 2019-10-13 DIAGNOSIS — E785 Hyperlipidemia, unspecified: Secondary | ICD-10-CM | POA: Diagnosis not present

## 2019-10-13 DIAGNOSIS — I2510 Atherosclerotic heart disease of native coronary artery without angina pectoris: Secondary | ICD-10-CM | POA: Diagnosis not present

## 2019-10-13 DIAGNOSIS — I252 Old myocardial infarction: Secondary | ICD-10-CM | POA: Diagnosis not present

## 2019-10-13 DIAGNOSIS — I480 Paroxysmal atrial fibrillation: Secondary | ICD-10-CM | POA: Diagnosis not present

## 2019-10-13 DIAGNOSIS — I69122 Dysarthria following nontraumatic intracerebral hemorrhage: Secondary | ICD-10-CM | POA: Diagnosis not present

## 2019-10-13 DIAGNOSIS — I69192 Facial weakness following nontraumatic intracerebral hemorrhage: Secondary | ICD-10-CM | POA: Diagnosis not present

## 2019-10-13 DIAGNOSIS — N319 Neuromuscular dysfunction of bladder, unspecified: Secondary | ICD-10-CM | POA: Diagnosis not present

## 2019-10-13 DIAGNOSIS — K219 Gastro-esophageal reflux disease without esophagitis: Secondary | ICD-10-CM | POA: Diagnosis not present

## 2019-10-13 DIAGNOSIS — I69154 Hemiplegia and hemiparesis following nontraumatic intracerebral hemorrhage affecting left non-dominant side: Secondary | ICD-10-CM | POA: Diagnosis not present

## 2019-10-13 DIAGNOSIS — I110 Hypertensive heart disease with heart failure: Secondary | ICD-10-CM | POA: Diagnosis not present

## 2019-10-13 DIAGNOSIS — Z86718 Personal history of other venous thrombosis and embolism: Secondary | ICD-10-CM | POA: Diagnosis not present

## 2019-10-13 DIAGNOSIS — I69191 Dysphagia following nontraumatic intracerebral hemorrhage: Secondary | ICD-10-CM | POA: Diagnosis not present

## 2019-10-13 DIAGNOSIS — I509 Heart failure, unspecified: Secondary | ICD-10-CM | POA: Diagnosis not present

## 2019-10-13 DIAGNOSIS — R1310 Dysphagia, unspecified: Secondary | ICD-10-CM | POA: Diagnosis not present

## 2019-10-13 NOTE — Telephone Encounter (Signed)
Not yet

## 2019-10-13 NOTE — Telephone Encounter (Signed)
Patient's sister left a voicemail wanting to know if the results are back yet from the stool sample that was sent off?

## 2019-10-13 NOTE — Telephone Encounter (Signed)
Left message on vm per dpr for pt's sister, Steward Drone (on dpr), informing her Dr. [name] has not received results yet.  But she will be notified as soon as they are received and reviewed.

## 2019-10-14 ENCOUNTER — Other Ambulatory Visit: Payer: Self-pay | Admitting: Adult Health

## 2019-10-14 DIAGNOSIS — G40909 Epilepsy, unspecified, not intractable, without status epilepticus: Secondary | ICD-10-CM

## 2019-10-14 DIAGNOSIS — I69191 Dysphagia following nontraumatic intracerebral hemorrhage: Secondary | ICD-10-CM | POA: Diagnosis not present

## 2019-10-14 DIAGNOSIS — R1310 Dysphagia, unspecified: Secondary | ICD-10-CM | POA: Diagnosis not present

## 2019-10-14 DIAGNOSIS — I110 Hypertensive heart disease with heart failure: Secondary | ICD-10-CM | POA: Diagnosis not present

## 2019-10-14 DIAGNOSIS — Z86718 Personal history of other venous thrombosis and embolism: Secondary | ICD-10-CM | POA: Diagnosis not present

## 2019-10-14 DIAGNOSIS — I509 Heart failure, unspecified: Secondary | ICD-10-CM | POA: Diagnosis not present

## 2019-10-14 DIAGNOSIS — I69192 Facial weakness following nontraumatic intracerebral hemorrhage: Secondary | ICD-10-CM | POA: Diagnosis not present

## 2019-10-14 DIAGNOSIS — E785 Hyperlipidemia, unspecified: Secondary | ICD-10-CM | POA: Diagnosis not present

## 2019-10-14 DIAGNOSIS — I480 Paroxysmal atrial fibrillation: Secondary | ICD-10-CM | POA: Diagnosis not present

## 2019-10-14 DIAGNOSIS — N319 Neuromuscular dysfunction of bladder, unspecified: Secondary | ICD-10-CM | POA: Diagnosis not present

## 2019-10-14 DIAGNOSIS — I69122 Dysarthria following nontraumatic intracerebral hemorrhage: Secondary | ICD-10-CM | POA: Diagnosis not present

## 2019-10-14 DIAGNOSIS — I2510 Atherosclerotic heart disease of native coronary artery without angina pectoris: Secondary | ICD-10-CM | POA: Diagnosis not present

## 2019-10-14 DIAGNOSIS — I69154 Hemiplegia and hemiparesis following nontraumatic intracerebral hemorrhage affecting left non-dominant side: Secondary | ICD-10-CM | POA: Diagnosis not present

## 2019-10-14 DIAGNOSIS — K219 Gastro-esophageal reflux disease without esophagitis: Secondary | ICD-10-CM | POA: Diagnosis not present

## 2019-10-14 DIAGNOSIS — I252 Old myocardial infarction: Secondary | ICD-10-CM | POA: Diagnosis not present

## 2019-10-15 NOTE — Telephone Encounter (Addendum)
Spoke with pt's sister, Steward Drone, relaying Dr. Timoteo Expose message.  She verbalizes understanding.  Says it's hard to get pt to appts right now.  So she will have to see about making appts for next week.  FYI to Dr. [name].

## 2019-10-15 NOTE — Telephone Encounter (Signed)
GI pathogen panel takes several days to run - would hope it returns by Monday.  If having black tarry stools, this could be a sign of lower GI bleeding and would recommend urgent evaluation at ER.  Would patient be able to come into office next week for labwork and office visit?

## 2019-10-15 NOTE — Telephone Encounter (Signed)
[name] Cruz (DPR signed) left v/m that pt has had watery diarrhea for 3 wks; the [facility] nurse came out on 10/14/19 and said the stools were black which could mean there is blood in stools. I spoke with [name] Cruz and pt ran out of pepto bismol on 10/12/19. Pt is not taking any med for diarrhea now. Pt is not having abd pain. [name] Cruz said has been giving bland diet but as soon as eats it comes straight thru as diarrhea. Pt has been drinking water and gatorade; [name] Cruz said the [facility] nurse did not have any other comment except to monitor pts vital signs; [name] Cruz is not sure when or if the nurse is coming back out.[name] Cruz is very concerned about pts quality of life with diarrhea for 3 wks. [name] Cruz wants to know when will get stool test back; Gastrointestinal pathogen panel collected on 10/11/19, [name] Cruz request cb after Dr [name] reviews note.

## 2019-10-18 DIAGNOSIS — I480 Paroxysmal atrial fibrillation: Secondary | ICD-10-CM | POA: Diagnosis not present

## 2019-10-18 DIAGNOSIS — I69191 Dysphagia following nontraumatic intracerebral hemorrhage: Secondary | ICD-10-CM | POA: Diagnosis not present

## 2019-10-18 DIAGNOSIS — I69192 Facial weakness following nontraumatic intracerebral hemorrhage: Secondary | ICD-10-CM | POA: Diagnosis not present

## 2019-10-18 DIAGNOSIS — I252 Old myocardial infarction: Secondary | ICD-10-CM | POA: Diagnosis not present

## 2019-10-18 DIAGNOSIS — I110 Hypertensive heart disease with heart failure: Secondary | ICD-10-CM | POA: Diagnosis not present

## 2019-10-18 DIAGNOSIS — I69122 Dysarthria following nontraumatic intracerebral hemorrhage: Secondary | ICD-10-CM | POA: Diagnosis not present

## 2019-10-18 DIAGNOSIS — I69154 Hemiplegia and hemiparesis following nontraumatic intracerebral hemorrhage affecting left non-dominant side: Secondary | ICD-10-CM | POA: Diagnosis not present

## 2019-10-18 DIAGNOSIS — Z86718 Personal history of other venous thrombosis and embolism: Secondary | ICD-10-CM | POA: Diagnosis not present

## 2019-10-18 DIAGNOSIS — I509 Heart failure, unspecified: Secondary | ICD-10-CM | POA: Diagnosis not present

## 2019-10-18 DIAGNOSIS — N319 Neuromuscular dysfunction of bladder, unspecified: Secondary | ICD-10-CM | POA: Diagnosis not present

## 2019-10-18 DIAGNOSIS — K219 Gastro-esophageal reflux disease without esophagitis: Secondary | ICD-10-CM | POA: Diagnosis not present

## 2019-10-18 DIAGNOSIS — E785 Hyperlipidemia, unspecified: Secondary | ICD-10-CM | POA: Diagnosis not present

## 2019-10-18 DIAGNOSIS — R1310 Dysphagia, unspecified: Secondary | ICD-10-CM | POA: Diagnosis not present

## 2019-10-18 DIAGNOSIS — I2510 Atherosclerotic heart disease of native coronary artery without angina pectoris: Secondary | ICD-10-CM | POA: Diagnosis not present

## 2019-10-18 LAB — GASTROINTESTINAL PATHOGEN PANEL PCR
C. difficile Tox A/B, PCR: NOT DETECTED
Campylobacter, PCR: NOT DETECTED
Cryptosporidium, PCR: NOT DETECTED
E coli (ETEC) LT/ST PCR: NOT DETECTED
E coli (STEC) stx1/stx2, PCR: NOT DETECTED
E coli 0157, PCR: NOT DETECTED
Giardia lamblia, PCR: NOT DETECTED
Norovirus, PCR: NOT DETECTED
Rotavirus A, PCR: NOT DETECTED
Salmonella, PCR: NOT DETECTED
Shigella, PCR: NOT DETECTED

## 2019-10-19 ENCOUNTER — Telehealth: Payer: Self-pay

## 2019-10-19 NOTE — Telephone Encounter (Signed)
Do you want to squeeze pt in tomorrow or is it ok to schedule him on Fri, 10/22/19?

## 2019-10-19 NOTE — Telephone Encounter (Signed)
Pt's sister and [place], Steward Drone, called to say his diarrhea is not any better. Since all the labs were normal, asking what is the next step.

## 2019-10-19 NOTE — Telephone Encounter (Signed)
Fortunately doesn't seem like infection is causing this.  Please schedule office visit for ongoing diarrhea for almost 4 wks now.

## 2019-10-20 DIAGNOSIS — E785 Hyperlipidemia, unspecified: Secondary | ICD-10-CM | POA: Diagnosis not present

## 2019-10-20 DIAGNOSIS — N319 Neuromuscular dysfunction of bladder, unspecified: Secondary | ICD-10-CM | POA: Diagnosis not present

## 2019-10-20 DIAGNOSIS — I69122 Dysarthria following nontraumatic intracerebral hemorrhage: Secondary | ICD-10-CM | POA: Diagnosis not present

## 2019-10-20 DIAGNOSIS — K219 Gastro-esophageal reflux disease without esophagitis: Secondary | ICD-10-CM | POA: Diagnosis not present

## 2019-10-20 DIAGNOSIS — I69154 Hemiplegia and hemiparesis following nontraumatic intracerebral hemorrhage affecting left non-dominant side: Secondary | ICD-10-CM | POA: Diagnosis not present

## 2019-10-20 DIAGNOSIS — I252 Old myocardial infarction: Secondary | ICD-10-CM | POA: Diagnosis not present

## 2019-10-20 DIAGNOSIS — I509 Heart failure, unspecified: Secondary | ICD-10-CM | POA: Diagnosis not present

## 2019-10-20 DIAGNOSIS — I480 Paroxysmal atrial fibrillation: Secondary | ICD-10-CM | POA: Diagnosis not present

## 2019-10-20 DIAGNOSIS — I2510 Atherosclerotic heart disease of native coronary artery without angina pectoris: Secondary | ICD-10-CM | POA: Diagnosis not present

## 2019-10-20 DIAGNOSIS — R1310 Dysphagia, unspecified: Secondary | ICD-10-CM | POA: Diagnosis not present

## 2019-10-20 DIAGNOSIS — I69192 Facial weakness following nontraumatic intracerebral hemorrhage: Secondary | ICD-10-CM | POA: Diagnosis not present

## 2019-10-20 DIAGNOSIS — Z86718 Personal history of other venous thrombosis and embolism: Secondary | ICD-10-CM | POA: Diagnosis not present

## 2019-10-20 DIAGNOSIS — I110 Hypertensive heart disease with heart failure: Secondary | ICD-10-CM | POA: Diagnosis not present

## 2019-10-20 DIAGNOSIS — I69191 Dysphagia following nontraumatic intracerebral hemorrhage: Secondary | ICD-10-CM | POA: Diagnosis not present

## 2019-10-20 NOTE — Telephone Encounter (Addendum)
Please schedule virtual visit ok to do Friday 12:30pm.  Please have Steward Drone be present during virtual visit.  Are they able to bring him in for curbside labs or can [facility] draw labs if needed?

## 2019-10-20 NOTE — Telephone Encounter (Signed)
Patient's sister Steward Drone left a voicemail stating that she got the message about bringing her brother in today or Friday. Steward Drone stated that she would love to bring her brother into the office but they are unable to. Steward Drone stated that they do not have transportation and the patient can not walk. Steward Drone stated that her brother is not cooperative with transferring.

## 2019-10-21 NOTE — Telephone Encounter (Signed)
Ok can we schedule him for virtual visit at 4pm tomorrow? Thank you.

## 2019-10-21 NOTE — Telephone Encounter (Signed)
I called and spoke with Steward Drone. She states she wouldn't be able to get to him for a virtual visit until 3:30 tomorrow and the sister that stays with him is unable to do a virtual visit. Please advise. She did say that [facility] could draw labs if needed.

## 2019-10-22 ENCOUNTER — Encounter: Payer: Self-pay | Admitting: Family Medicine

## 2019-10-22 ENCOUNTER — Telehealth (INDEPENDENT_AMBULATORY_CARE_PROVIDER_SITE_OTHER): Payer: Medicare Other | Admitting: Family Medicine

## 2019-10-22 VITALS — BP 104/62 | HR 76 | Temp 97.9°F | Ht 63.0 in

## 2019-10-22 DIAGNOSIS — Z86718 Personal history of other venous thrombosis and embolism: Secondary | ICD-10-CM | POA: Diagnosis not present

## 2019-10-22 DIAGNOSIS — G9389 Other specified disorders of brain: Secondary | ICD-10-CM

## 2019-10-22 DIAGNOSIS — I69354 Hemiplegia and hemiparesis following cerebral infarction affecting left non-dominant side: Secondary | ICD-10-CM | POA: Diagnosis not present

## 2019-10-22 DIAGNOSIS — I69191 Dysphagia following nontraumatic intracerebral hemorrhage: Secondary | ICD-10-CM | POA: Diagnosis not present

## 2019-10-22 DIAGNOSIS — I69154 Hemiplegia and hemiparesis following nontraumatic intracerebral hemorrhage affecting left non-dominant side: Secondary | ICD-10-CM | POA: Diagnosis not present

## 2019-10-22 DIAGNOSIS — R4189 Other symptoms and signs involving cognitive functions and awareness: Secondary | ICD-10-CM

## 2019-10-22 DIAGNOSIS — I6930 Unspecified sequelae of cerebral infarction: Secondary | ICD-10-CM

## 2019-10-22 DIAGNOSIS — K219 Gastro-esophageal reflux disease without esophagitis: Secondary | ICD-10-CM | POA: Diagnosis not present

## 2019-10-22 DIAGNOSIS — I69192 Facial weakness following nontraumatic intracerebral hemorrhage: Secondary | ICD-10-CM | POA: Diagnosis not present

## 2019-10-22 DIAGNOSIS — K529 Noninfective gastroenteritis and colitis, unspecified: Secondary | ICD-10-CM

## 2019-10-22 DIAGNOSIS — N319 Neuromuscular dysfunction of bladder, unspecified: Secondary | ICD-10-CM | POA: Diagnosis not present

## 2019-10-22 DIAGNOSIS — I10 Essential (primary) hypertension: Secondary | ICD-10-CM

## 2019-10-22 DIAGNOSIS — E785 Hyperlipidemia, unspecified: Secondary | ICD-10-CM | POA: Diagnosis not present

## 2019-10-22 DIAGNOSIS — I509 Heart failure, unspecified: Secondary | ICD-10-CM | POA: Diagnosis not present

## 2019-10-22 DIAGNOSIS — R1310 Dysphagia, unspecified: Secondary | ICD-10-CM | POA: Diagnosis not present

## 2019-10-22 DIAGNOSIS — I610 Nontraumatic intracerebral hemorrhage in hemisphere, subcortical: Secondary | ICD-10-CM

## 2019-10-22 DIAGNOSIS — I25810 Atherosclerosis of coronary artery bypass graft(s) without angina pectoris: Secondary | ICD-10-CM

## 2019-10-22 DIAGNOSIS — I110 Hypertensive heart disease with heart failure: Secondary | ICD-10-CM | POA: Diagnosis not present

## 2019-10-22 DIAGNOSIS — R32 Unspecified urinary incontinence: Secondary | ICD-10-CM | POA: Insufficient documentation

## 2019-10-22 DIAGNOSIS — G40909 Epilepsy, unspecified, not intractable, without status epilepticus: Secondary | ICD-10-CM

## 2019-10-22 DIAGNOSIS — R159 Full incontinence of feces: Secondary | ICD-10-CM | POA: Insufficient documentation

## 2019-10-22 DIAGNOSIS — I69122 Dysarthria following nontraumatic intracerebral hemorrhage: Secondary | ICD-10-CM | POA: Diagnosis not present

## 2019-10-22 DIAGNOSIS — I2510 Atherosclerotic heart disease of native coronary artery without angina pectoris: Secondary | ICD-10-CM | POA: Diagnosis not present

## 2019-10-22 DIAGNOSIS — I252 Old myocardial infarction: Secondary | ICD-10-CM | POA: Diagnosis not present

## 2019-10-22 DIAGNOSIS — I480 Paroxysmal atrial fibrillation: Secondary | ICD-10-CM | POA: Diagnosis not present

## 2019-10-22 NOTE — Assessment & Plan Note (Addendum)
4th week of diarrhea described as watery, runny, with accidents. Had negative GI pathogen panel 2 wks ago. Nocturnal diarrhea present as well.  Will ask [facility] to come out and draw bloodwork for further evaluation (ESR, CBC, CMP, TSH, Mg, b12, iron), rec hold sertraline for now r/o ?SSRI induced microscopic colitis, if persists then prescribe lomotil and refer to GI. Pt/sister agree with plan.  Largely bed bound - no concern for bed sores at this time - reviewed regular barrier cream use and frequent positioning changes.

## 2019-10-22 NOTE — Telephone Encounter (Signed)
I spoke with Steward Drone and she does want virtual appt with DR G today at 4 PM. Sending to [facility] to schedule.

## 2019-10-22 NOTE — Progress Notes (Signed)
Virtual visit completed through MyChart, a video enabled telemedicine application. Due to national recommendations of social distancing due to COVID-19, a virtual visit is felt to be most appropriate for this patient at this time. Reviewed limitations, risks, security and privacy concerns of performing a virtual visit and the availability of in person appointments. I also reviewed that there may be a patient responsible charge related to this service. The patient agreed to proceed.   Patient location: home Provider location: [place] [facility], office Persons participating in this virtual visit: patient, provider, POA sister [name])  If any vitals were documented, they were collected by patient at home unless specified below.    BP 104/62   Pulse 76   Temp 97.9 F (36.6 C)   Ht $R'5\' 3"'SX$  (1.6 m)   BMI 30.58 kg/m    CC: diarrhea  Subjective:    Patient ID: [name] Cruz, male    DOB: May 05, 1955, 64 y.o.   MRN: [id number]  HPI: [name] Cruz is a 64 y.o. male presenting on 10/22/2019 for Diarrhea (Per pt's sister, [name] [facility]), states pt is still having diarrhea despited trying meds and recommendations by Dr. Darnell Level.  Started about 4 wks ago.  )   Latest hospitalization 07/2019 for sudden L sided weakness, found to have [place] (R thalamic hemorrhage) in setting of marked hypertension. Eliquis, aspirin on hold since then. Completed CIR course. Sent home off BP meds. Treated for E coli UTI with macrodantin and yeast infection with diflucan. Incontinent of bowel and bladder. Discharged on 08/17/2019 to SNF [facility] for 20 days - unable to stay longer due to cost. On keppra depakote and vimpat for seizure history.   Largely bedbound now, sisters care for him at home.  [facility] PT/OT/ST and RN involved.  Would be able to get labs through [facility] if needed.   Continues geodon $RemoveBeforeDE'40mg'avpBdujuCEAXDER$  nightly and zoloft $RemoveBe'25mg'qSCMGXTtT$  daily for mood.   Diarrhea started 1 month ago (09/27/2019). Initially managed  on imodium, then pepto bismol with only temporary benefit. Has changed to bland diet without benefit. Watery, runny - uncontrollable accidents - having at least 4+ stools/day. No clay colored stools, no grease or increased buoyancy.   No fevers/chills, abd pain, nausea/vomiting, blood in stool, urinary changes. No rash to bottom. Both daytime and night time diarrhea.   Seen at ER 10/09/2019 without significant diarrhea so no stool studies were run.  He completed GI pathogen panel on 10/09/2019 - negative  Barrier cream helps - not noticing any bedsores.    Admit date: 07/26/2019 Discharge date: 08/17/2019  Discharge Diagnoses:  Principal Problem:   Thalamic hemorrhage ([place]) Active Problems:   SAH (subarachnoid hemorrhage) (HCC)   Neurogenic bladder   Benign essential HTN   Labile blood pressure   Hemiparesis affecting left side as late effect of stroke ([place])      Relevant past medical, surgical, family and social history reviewed and updated as indicated. Interim medical history since our last visit reviewed. Allergies and medications reviewed and updated. Outpatient Medications Prior to Visit  Medication Sig Dispense Refill  . atorvastatin (LIPITOR) 80 MG tablet TAKE 1 TABLET BY MOUTH EVERY DAY 90 tablet 0  . Cyanocobalamin (VITAMIN B-12) 1000 MCG SUBL Place 1,000 mcg under the tongue daily.    . Lacosamide (VIMPAT) 100 MG TABS Take 200 mg at 8 am and 200 mg at 10 pm daily. 120 tablet 0  . levETIRAcetam (KEPPRA) 750 MG tablet TAKE 2 TABLETS (1,500 MG  TOTAL) BY MOUTH 2 (TWO) TIMES DAILY. 120 tablet 1  . Multiple Vitamin (MULTIVITAMIN WITH MINERALS) TABS tablet Take 1 tablet by mouth daily. 90 tablet 0  . pantoprazole (PROTONIX) 40 MG tablet Take 1 tablet (40 mg total) by mouth at bedtime.    . protective barrier (RESTORE) CREA Apply 1 application topically 2 (two) times daily. And prn after pericare to sacrum/buttocks.  0  . sertraline (ZOLOFT) 25 MG tablet TAKE 1 TABLET BY MOUTH  EVERY DAY (Patient taking differently: Take 25 mg by mouth daily. ) 90 tablet 1  . ziprasidone (GEODON) 40 MG capsule TAKE 1 CAPSULE (40 MG TOTAL) BY MOUTH AT BEDTIME. 90 capsule 1  . divalproex (DEPAKOTE) 500 MG DR tablet Take 1 tablet (500 mg total) by mouth 2 (two) times daily. 60 tablet 2  . acetaminophen (TYLENOL) 325 MG tablet Take 2 tablets (650 mg total) by mouth every 4 (four) hours as needed for mild pain.    Marland Kitchen bismuth subsalicylate (PEPTO-BISMOL) 262 MG/15ML suspension Take 30 mLs by mouth every 6 (six) hours as needed for diarrhea or loose stools. 360 mL 0  . ciprofloxacin (CIPRO) 500 MG tablet Take 1 tablet (500 mg total) by mouth 2 (two) times daily. 6 tablet 0  . Potassium Chloride ER 20 MEQ TBCR Take 20 mEq by mouth daily for 5 doses. (Patient not taking: Reported on 10/22/2019) 5 tablet 0  . tamsulosin (FLOMAX) 0.4 MG CAPS capsule Take 1 capsule (0.4 mg total) by mouth daily after supper. 30 capsule    No facility-administered medications prior to visit.     Per HPI unless specifically indicated in ROS section below Review of Systems Objective:  BP 104/62   Pulse 76   Temp 97.9 F (36.6 C)   Ht $R'5\' 3"'Xb$  (1.6 m)   BMI 30.58 kg/m   Wt Readings from Last 3 Encounters:  10/09/19 172 lb 9.9 oz (78.3 kg)  07/26/19 172 lb 9.9 oz (78.3 kg)  07/19/19 186 lb 4.6 oz (84.5 kg)       Physical exam: Gen: alert, NAD, not ill appearing Pulm: speaks in complete sentences without increased work of breathing Psych: normal mood, normal thought content      Results for orders placed or performed in visit on 10/11/19  Fecal Occult Blood, Guaiac  Result Value Ref Range   Fecal Occult Blood Negative    Assessment & Plan:   Problem List Items Addressed This Visit    Thalamic hemorrhage ([place])   Seizure disorder ([place])    No recurrent seizures since on this regimen of vimpat, depakote, keppra.       HTN (hypertension)    Currently off antihypertensives. Today's BP running mildly low  but overall stable.       History of stroke with current residual effects    H/o both ischemic and hemorrhagic strokes       Hemiparesis affecting left side as late effect of stroke ([place])   Encephalomalacia   Coronary artery disease involving coronary bypass graft of native heart without angina pectoris    Off all antiplatelet agents after recent intracranial hemorrhage.       Cognitive impairment   Chronic diarrhea - Primary    4th week of diarrhea described as watery, runny, with accidents. Had negative GI pathogen panel 2 wks ago. Nocturnal diarrhea present as well.  Will ask HHRN to come out and draw bloodwork for further evaluation (ESR, CBC, CMP, TSH, Mg, b12, iron), rec hold sertraline for  now r/o ?SSRI induced microscopic colitis, if persists then prescribe lomotil and refer to GI. Pt/sister agree with plan.  Largely bed bound - no concern for bed sores at this time - reviewed regular barrier cream use and frequent positioning changes.           No orders of the defined types were placed in this encounter.  No orders of the defined types were placed in this encounter.   I discussed the assessment and treatment plan with the patient. The patient was provided an opportunity to ask questions and all were answered. The patient agreed with the plan and demonstrated an understanding of the instructions. The patient was advised to call back or seek an in-person evaluation if the symptoms worsen or if the condition fails to improve as anticipated.  Follow up plan: No follow-ups on file.  Ria Bush, MD

## 2019-10-22 NOTE — Assessment & Plan Note (Signed)
BP stable off any antihypertensive.

## 2019-10-23 ENCOUNTER — Telehealth: Payer: Self-pay | Admitting: Family Medicine

## 2019-10-23 NOTE — Assessment & Plan Note (Signed)
H/o both ischemic and hemorrhagic strokes

## 2019-10-23 NOTE — Telephone Encounter (Signed)
See recent OV - I have placed lab slip to send to [facility] requesting blood draw at their next visit in LIsa's box.

## 2019-10-23 NOTE — Assessment & Plan Note (Signed)
No recurrent seizures since on this regimen of vimpat, depakote, keppra.

## 2019-10-23 NOTE — Assessment & Plan Note (Signed)
Off all antiplatelet agents after recent intracranial hemorrhage.

## 2019-10-23 NOTE — Assessment & Plan Note (Signed)
Currently off antihypertensives. Today's BP running mildly low but overall stable.

## 2019-10-25 ENCOUNTER — Telehealth: Payer: Self-pay

## 2019-10-25 DIAGNOSIS — R1310 Dysphagia, unspecified: Secondary | ICD-10-CM | POA: Diagnosis not present

## 2019-10-25 DIAGNOSIS — I69192 Facial weakness following nontraumatic intracerebral hemorrhage: Secondary | ICD-10-CM | POA: Diagnosis not present

## 2019-10-25 DIAGNOSIS — E785 Hyperlipidemia, unspecified: Secondary | ICD-10-CM | POA: Diagnosis not present

## 2019-10-25 DIAGNOSIS — I252 Old myocardial infarction: Secondary | ICD-10-CM | POA: Diagnosis not present

## 2019-10-25 DIAGNOSIS — I69122 Dysarthria following nontraumatic intracerebral hemorrhage: Secondary | ICD-10-CM | POA: Diagnosis not present

## 2019-10-25 DIAGNOSIS — I69154 Hemiplegia and hemiparesis following nontraumatic intracerebral hemorrhage affecting left non-dominant side: Secondary | ICD-10-CM | POA: Diagnosis not present

## 2019-10-25 DIAGNOSIS — I2510 Atherosclerotic heart disease of native coronary artery without angina pectoris: Secondary | ICD-10-CM | POA: Diagnosis not present

## 2019-10-25 DIAGNOSIS — I110 Hypertensive heart disease with heart failure: Secondary | ICD-10-CM | POA: Diagnosis not present

## 2019-10-25 DIAGNOSIS — I69191 Dysphagia following nontraumatic intracerebral hemorrhage: Secondary | ICD-10-CM | POA: Diagnosis not present

## 2019-10-25 DIAGNOSIS — I480 Paroxysmal atrial fibrillation: Secondary | ICD-10-CM | POA: Diagnosis not present

## 2019-10-25 DIAGNOSIS — N319 Neuromuscular dysfunction of bladder, unspecified: Secondary | ICD-10-CM | POA: Diagnosis not present

## 2019-10-25 DIAGNOSIS — K219 Gastro-esophageal reflux disease without esophagitis: Secondary | ICD-10-CM | POA: Diagnosis not present

## 2019-10-25 DIAGNOSIS — Z86718 Personal history of other venous thrombosis and embolism: Secondary | ICD-10-CM | POA: Diagnosis not present

## 2019-10-25 DIAGNOSIS — I509 Heart failure, unspecified: Secondary | ICD-10-CM | POA: Diagnosis not present

## 2019-10-25 DIAGNOSIS — K529 Noninfective gastroenteritis and colitis, unspecified: Secondary | ICD-10-CM

## 2019-10-25 MED ORDER — DIPHENOXYLATE-ATROPINE 2.5-0.025 MG PO TABS: 1.0000 | ORAL_TABLET | Freq: Two times a day (BID) | ORAL | 0 refills | Status: DC | PRN

## 2019-10-25 NOTE — Telephone Encounter (Signed)
Faxed rx for labs to [facility] at [phone].

## 2019-10-25 NOTE — Addendum Note (Signed)
Addended by: [name] on: 10/25/2019 01:02 PM   Modules accepted: Orders

## 2019-10-25 NOTE — Telephone Encounter (Signed)
Left message on vm per dpr relaying Dr. G's message.  

## 2019-10-25 NOTE — Telephone Encounter (Signed)
Thanks.  GI referral placed. I have also sent in lomotil for them to try in place of imodium. May take 1 tab 1-2 times daily as needed.

## 2019-10-25 NOTE — Telephone Encounter (Signed)
Thanks for letting us know.. I will send forward to Dr [name] (he will be back tomorrow).  I think he mentioned a GI referral if things did not improve  I will leave that to him

## 2019-10-25 NOTE — Telephone Encounter (Signed)
Pt's sister, Steward Drone [facility]), lvm.  States pt had video visit with Dr. [name] Friday (10/22/19).  She was told hold pt's Zoloft over the weekend to see if that would eliminate diarrhea.  Dr. [name] wanted her to call Monday to let him know how it went and if it didn't help, he would call in an rx.  Steward Drone states it did not work and pt still has diarrhea, no changes.  Steward Drone can reached at [phone].   Fwd to Dr. Milinda Antis to address in Dr. Timoteo Expose absence.

## 2019-10-26 DIAGNOSIS — I2510 Atherosclerotic heart disease of native coronary artery without angina pectoris: Secondary | ICD-10-CM | POA: Diagnosis not present

## 2019-10-26 DIAGNOSIS — I69191 Dysphagia following nontraumatic intracerebral hemorrhage: Secondary | ICD-10-CM | POA: Diagnosis not present

## 2019-10-26 DIAGNOSIS — Z86718 Personal history of other venous thrombosis and embolism: Secondary | ICD-10-CM | POA: Diagnosis not present

## 2019-10-26 DIAGNOSIS — I69122 Dysarthria following nontraumatic intracerebral hemorrhage: Secondary | ICD-10-CM | POA: Diagnosis not present

## 2019-10-26 DIAGNOSIS — I509 Heart failure, unspecified: Secondary | ICD-10-CM | POA: Diagnosis not present

## 2019-10-26 DIAGNOSIS — E785 Hyperlipidemia, unspecified: Secondary | ICD-10-CM | POA: Diagnosis not present

## 2019-10-26 DIAGNOSIS — R1310 Dysphagia, unspecified: Secondary | ICD-10-CM | POA: Diagnosis not present

## 2019-10-26 DIAGNOSIS — N319 Neuromuscular dysfunction of bladder, unspecified: Secondary | ICD-10-CM | POA: Diagnosis not present

## 2019-10-26 DIAGNOSIS — K219 Gastro-esophageal reflux disease without esophagitis: Secondary | ICD-10-CM | POA: Diagnosis not present

## 2019-10-26 DIAGNOSIS — I69154 Hemiplegia and hemiparesis following nontraumatic intracerebral hemorrhage affecting left non-dominant side: Secondary | ICD-10-CM | POA: Diagnosis not present

## 2019-10-26 DIAGNOSIS — I110 Hypertensive heart disease with heart failure: Secondary | ICD-10-CM | POA: Diagnosis not present

## 2019-10-26 DIAGNOSIS — I480 Paroxysmal atrial fibrillation: Secondary | ICD-10-CM | POA: Diagnosis not present

## 2019-10-26 DIAGNOSIS — I69192 Facial weakness following nontraumatic intracerebral hemorrhage: Secondary | ICD-10-CM | POA: Diagnosis not present

## 2019-10-26 DIAGNOSIS — I252 Old myocardial infarction: Secondary | ICD-10-CM | POA: Diagnosis not present

## 2019-10-26 NOTE — Telephone Encounter (Signed)
OV has already been c/x.

## 2019-10-26 NOTE — Telephone Encounter (Signed)
Looks like pt is scheduled tomorrow 12pm for physical.  We just did virtual last week, I don't think pt can come into office due to disability. plz verify with sister Steward Drone that he's not coming and may remove from tomorrow's schedule

## 2019-10-27 ENCOUNTER — Encounter: Payer: Medicare Other | Admitting: Family Medicine

## 2019-10-27 DIAGNOSIS — I69154 Hemiplegia and hemiparesis following nontraumatic intracerebral hemorrhage affecting left non-dominant side: Secondary | ICD-10-CM | POA: Diagnosis not present

## 2019-10-27 DIAGNOSIS — I509 Heart failure, unspecified: Secondary | ICD-10-CM | POA: Diagnosis not present

## 2019-10-27 DIAGNOSIS — I69191 Dysphagia following nontraumatic intracerebral hemorrhage: Secondary | ICD-10-CM | POA: Diagnosis not present

## 2019-10-27 DIAGNOSIS — I480 Paroxysmal atrial fibrillation: Secondary | ICD-10-CM | POA: Diagnosis not present

## 2019-10-27 DIAGNOSIS — N319 Neuromuscular dysfunction of bladder, unspecified: Secondary | ICD-10-CM | POA: Diagnosis not present

## 2019-10-27 DIAGNOSIS — I69192 Facial weakness following nontraumatic intracerebral hemorrhage: Secondary | ICD-10-CM | POA: Diagnosis not present

## 2019-10-27 DIAGNOSIS — I2510 Atherosclerotic heart disease of native coronary artery without angina pectoris: Secondary | ICD-10-CM | POA: Diagnosis not present

## 2019-10-27 DIAGNOSIS — K219 Gastro-esophageal reflux disease without esophagitis: Secondary | ICD-10-CM | POA: Diagnosis not present

## 2019-10-27 DIAGNOSIS — Z86718 Personal history of other venous thrombosis and embolism: Secondary | ICD-10-CM | POA: Diagnosis not present

## 2019-10-27 DIAGNOSIS — E785 Hyperlipidemia, unspecified: Secondary | ICD-10-CM | POA: Diagnosis not present

## 2019-10-27 DIAGNOSIS — I110 Hypertensive heart disease with heart failure: Secondary | ICD-10-CM | POA: Diagnosis not present

## 2019-10-27 DIAGNOSIS — R1310 Dysphagia, unspecified: Secondary | ICD-10-CM | POA: Diagnosis not present

## 2019-10-27 DIAGNOSIS — I252 Old myocardial infarction: Secondary | ICD-10-CM | POA: Diagnosis not present

## 2019-10-27 DIAGNOSIS — I69122 Dysarthria following nontraumatic intracerebral hemorrhage: Secondary | ICD-10-CM | POA: Diagnosis not present

## 2019-10-28 ENCOUNTER — Emergency Department (HOSPITAL_COMMUNITY)
Admission: EM | Admit: 2019-10-28 | Discharge: 2019-10-28 | Disposition: A | Discharge status: Home / Self Care | Payer: Medicare Other | Attending: Emergency Medicine | Admitting: Emergency Medicine

## 2019-10-28 ENCOUNTER — Emergency Department (HOSPITAL_COMMUNITY): Payer: Medicare Other

## 2019-10-28 ENCOUNTER — Encounter (HOSPITAL_COMMUNITY): Payer: Self-pay | Admitting: Emergency Medicine

## 2019-10-28 DIAGNOSIS — K219 Gastro-esophageal reflux disease without esophagitis: Secondary | ICD-10-CM | POA: Diagnosis not present

## 2019-10-28 DIAGNOSIS — I69122 Dysarthria following nontraumatic intracerebral hemorrhage: Secondary | ICD-10-CM | POA: Diagnosis not present

## 2019-10-28 DIAGNOSIS — Z743 Need for continuous supervision: Secondary | ICD-10-CM | POA: Diagnosis not present

## 2019-10-28 DIAGNOSIS — M160 Bilateral primary osteoarthritis of hip: Secondary | ICD-10-CM | POA: Diagnosis not present

## 2019-10-28 DIAGNOSIS — R1310 Dysphagia, unspecified: Secondary | ICD-10-CM | POA: Diagnosis not present

## 2019-10-28 DIAGNOSIS — K6389 Other specified diseases of intestine: Secondary | ICD-10-CM | POA: Diagnosis not present

## 2019-10-28 DIAGNOSIS — I2510 Atherosclerotic heart disease of native coronary artery without angina pectoris: Secondary | ICD-10-CM | POA: Insufficient documentation

## 2019-10-28 DIAGNOSIS — Z86718 Personal history of other venous thrombosis and embolism: Secondary | ICD-10-CM | POA: Diagnosis not present

## 2019-10-28 DIAGNOSIS — N319 Neuromuscular dysfunction of bladder, unspecified: Secondary | ICD-10-CM | POA: Diagnosis not present

## 2019-10-28 DIAGNOSIS — Z87891 Personal history of nicotine dependence: Secondary | ICD-10-CM | POA: Diagnosis not present

## 2019-10-28 DIAGNOSIS — I69191 Dysphagia following nontraumatic intracerebral hemorrhage: Secondary | ICD-10-CM | POA: Diagnosis not present

## 2019-10-28 DIAGNOSIS — Z955 Presence of coronary angioplasty implant and graft: Secondary | ICD-10-CM | POA: Insufficient documentation

## 2019-10-28 DIAGNOSIS — G8190 Hemiplegia, unspecified affecting unspecified side: Secondary | ICD-10-CM | POA: Diagnosis not present

## 2019-10-28 DIAGNOSIS — Z79899 Other long term (current) drug therapy: Secondary | ICD-10-CM | POA: Diagnosis not present

## 2019-10-28 DIAGNOSIS — K56609 Unspecified intestinal obstruction, unspecified as to partial versus complete obstruction: Secondary | ICD-10-CM | POA: Diagnosis not present

## 2019-10-28 DIAGNOSIS — K529 Noninfective gastroenteritis and colitis, unspecified: Secondary | ICD-10-CM

## 2019-10-28 DIAGNOSIS — I509 Heart failure, unspecified: Secondary | ICD-10-CM | POA: Diagnosis not present

## 2019-10-28 DIAGNOSIS — I110 Hypertensive heart disease with heart failure: Secondary | ICD-10-CM | POA: Diagnosis not present

## 2019-10-28 DIAGNOSIS — R197 Diarrhea, unspecified: Secondary | ICD-10-CM | POA: Diagnosis not present

## 2019-10-28 DIAGNOSIS — Z7401 Bed confinement status: Secondary | ICD-10-CM | POA: Diagnosis not present

## 2019-10-28 DIAGNOSIS — E785 Hyperlipidemia, unspecified: Secondary | ICD-10-CM | POA: Diagnosis not present

## 2019-10-28 DIAGNOSIS — I10 Essential (primary) hypertension: Secondary | ICD-10-CM | POA: Diagnosis not present

## 2019-10-28 DIAGNOSIS — I480 Paroxysmal atrial fibrillation: Secondary | ICD-10-CM | POA: Diagnosis not present

## 2019-10-28 DIAGNOSIS — E860 Dehydration: Secondary | ICD-10-CM | POA: Diagnosis not present

## 2019-10-28 DIAGNOSIS — I252 Old myocardial infarction: Secondary | ICD-10-CM | POA: Diagnosis not present

## 2019-10-28 DIAGNOSIS — I69192 Facial weakness following nontraumatic intracerebral hemorrhage: Secondary | ICD-10-CM | POA: Diagnosis not present

## 2019-10-28 DIAGNOSIS — I69154 Hemiplegia and hemiparesis following nontraumatic intracerebral hemorrhage affecting left non-dominant side: Secondary | ICD-10-CM | POA: Diagnosis not present

## 2019-10-28 DIAGNOSIS — M47816 Spondylosis without myelopathy or radiculopathy, lumbar region: Secondary | ICD-10-CM | POA: Diagnosis not present

## 2019-10-28 DIAGNOSIS — M2550 Pain in unspecified joint: Secondary | ICD-10-CM | POA: Diagnosis not present

## 2019-10-28 LAB — TSH: TSH: 1.179 u[IU]/mL (ref 0.350–4.500)

## 2019-10-28 LAB — BASIC METABOLIC PANEL
Anion gap: 10 (ref 5–15)
BUN: 12 mg/dL (ref 8–23)
CO2: 27 mmol/L (ref 22–32)
Calcium: 8 mg/dL — ABNORMAL LOW (ref 8.9–10.3)
Chloride: 106 mmol/L (ref 98–111)
Creatinine, Ser: 0.52 mg/dL — ABNORMAL LOW (ref 0.61–1.24)
GFR, Estimated: >60 mL/min (ref >60)
Glucose, Bld: 93 mg/dL (ref 70–99)
Potassium: 3.7 mmol/L (ref 3.5–5.1)
Sodium: 143 mmol/L (ref 135–145)

## 2019-10-28 LAB — CBC WITH DIFFERENTIAL/PLATELET
Abs Immature Granulocytes: 0.01 10*3/uL (ref 0.00–0.07)
Basophils Absolute: 0 10*3/uL (ref 0.0–0.1)
Basophils Relative: 1 %
Eosinophils Absolute: 0.1 10*3/uL (ref 0.0–0.5)
Eosinophils Relative: 1 %
HCT: 34.9 % — ABNORMAL LOW (ref 39.0–52.0)
Hemoglobin: 11.2 g/dL — ABNORMAL LOW (ref 13.0–17.0)
Immature Granulocytes: 0 %
Lymphocytes Relative: 49 %
Lymphs Abs: 2.9 10*3/uL (ref 0.7–4.0)
MCH: 30.5 pg (ref 26.0–34.0)
MCHC: 32.1 g/dL (ref 30.0–36.0)
MCV: 95.1 fL (ref 80.0–100.0)
Monocytes Absolute: 0.8 10*3/uL (ref 0.1–1.0)
Monocytes Relative: 13 %
Neutro Abs: 2.2 10*3/uL (ref 1.7–7.7)
Neutrophils Relative %: 36 %
Platelets: UNDETERMINED 10*3/uL (ref 150–400)
RBC: 3.67 MIL/uL — ABNORMAL LOW (ref 4.22–5.81)
RDW: 14.5 % (ref 11.5–15.5)
WBC: 6 10*3/uL (ref 4.0–10.5)
nRBC: 0 % (ref 0.0–0.2)

## 2019-10-28 LAB — SEDIMENTATION RATE
Sed Rate: 7 mm/hr (ref 0–16)
Sed Rate: 5 mm/hr (ref 0–16)

## 2019-10-28 LAB — MAGNESIUM: Magnesium: 1.7 mg/dL (ref 1.7–2.4)

## 2019-10-28 LAB — IRON: Iron: 52 ug/dL (ref 45–182)

## 2019-10-28 LAB — C-REACTIVE PROTEIN: CRP: 1.9 mg/dL — ABNORMAL HIGH (ref <1.0)

## 2019-10-28 LAB — PHOSPHORUS: Phosphorus: 3.5 mg/dL (ref 2.5–4.6)

## 2019-10-28 LAB — VITAMIN B12: Vitamin B-12: 1597 pg/mL — ABNORMAL HIGH (ref 180–914)

## 2019-10-28 LAB — VALPROIC ACID LEVEL: Valproic Acid Lvl: 59 ug/mL (ref 50.0–100.0)

## 2019-10-28 MED ORDER — LACTATED RINGERS IV BOLUS
1000.0000 mL | Freq: Once | INTRAVENOUS | Status: AC
  Administered 2019-10-28: 1000 mL via INTRAVENOUS

## 2019-10-28 MED ORDER — MAGNESIUM OXIDE 400 (241.3 MG) MG PO TABS
800.0000 mg | ORAL_TABLET | Freq: Once | ORAL | Status: AC
  Administered 2019-10-28: 800 mg via ORAL
  Filled 2019-10-28: qty 2

## 2019-10-28 NOTE — ED Notes (Signed)
Report given to brenda gilmore who is at home to unlock the door ptar,

## 2019-10-28 NOTE — Discharge Instructions (Addendum)
The ER results are reassuring.  We have hydrated [name] Cruz.  I have discussed the case with lobe our gastroenterology.  A referral has been placed in the computer.  They have requested that you call them as soon as possible for an appointment.  Continue with the same medications that have been prescribed for diarrhea management. Ensure he hydrates well.

## 2019-10-28 NOTE — ED Triage Notes (Signed)
Patient BIB [facility] for diarrhea that started prior to 10/09/2019. Patient seen on 9/25 for same. Patient alert, oriented, and in no apparent distress at this time History of stroke with left sided weakness.

## 2019-10-28 NOTE — ED Notes (Signed)
Pt discharged with ptar 

## 2019-10-28 NOTE — ED Notes (Signed)
AVS instructions called and explained to pts poa brenda gilmore, pt waiting on ptar discharge.

## 2019-10-28 NOTE — ED Provider Notes (Signed)
[facility] EMERGENCY DEPARTMENT Provider Note   CSN: [id number] Arrival date & time: 10/28/19  1447     History Chief Complaint  Patient presents with  . Diarrhea    [name] Cruz is a 64 y.o. male.  HPI     64 year old male with history of stroke, DVT, hypertension, hyperlipidemia comes in a chief complaint of diarrhea. I spoke with patient's sister, she reports that patient has been having diarrhea for the last 5 weeks.  Patient gets about 6-8 BMs a day.  They have tried Imodium and some other medication without significant relief.  There has not been any blood in the stools.  Patient had stool studies by PCP which were negative.  Patient was recently seen on a virtual visit and a follow-up was requested with GI, however they do not have any information on the follow-up at this time.  Today home health nurse had come for some blood draw and they were unable to get blood, prompting them to call EMS as they suspected that patient had severe dehydration.  There has been no fevers, chills, nausea or vomiting.  Patient has no abdominal pain or any specific complaints from his side.  Patient was at a rehab facility until his insurance ran out.   Past Medical History:  Diagnosis Date  . Acute deep vein thrombosis (DVT) of left upper extremity (HCC)    L brachial and basilic veins, eliquis started 08/22/2017 to continue for 3 months total   . Cognitive impairment 2007   after stroke, saw rehab but told to stop because was too upsetting to him  . History of chicken pox   . HLD (hyperlipidemia)   . HTN (hypertension)   . NSTEMI (non-ST elevated myocardial infarction) (HCC) 02/21/2017  . Obesity   . Stroke, hemorrhagic (HCC) 2007   thought 2/2 HTN (240sbp); residual cognitive impairment, loss of R peripheral field, no driving    Patient Active Problem List   Diagnosis Date Noted  . Chronic diarrhea 10/22/2019  . SAH (subarachnoid hemorrhage) (HCC)   . Neurogenic  bladder   . Labile blood pressure   . Hemiparesis affecting left side as late effect of stroke (HCC)   . Thalamic hemorrhage (HCC) 07/26/2019  . ICH (intracerebral hemorrhage) (HCC) 07/19/2019  . History of DVT in adulthood 02/21/2019  . PAD (peripheral artery disease) (HCC) 12/14/2018  . Agitation 09/04/2018  . Acute CVA (cerebrovascular accident) (HCC) 08/07/2018  . Cerebellar stroke, acute (HCC) 07/10/2018  . Status epilepticus (HCC) 03/25/2018  . Unilateral occipital headache 10/03/2017  . Ischemic stroke (HCC) 08/23/2017  . Coronary artery disease involving coronary bypass graft of native heart without angina pectoris   . Atrial fibrillation (HCC)   . Dysphagia, post-stroke   . Cardiac arrest (HCC) 08/09/2017  . S/P CABG x 3 02/24/2017  . NSTEMI (non-ST elevated myocardial infarction) (HCC) 02/21/2017  . Encephalomalacia 10/28/2012  . Seizure disorder (HCC) 04/26/2012  . Alcohol abuse 04/26/2012  . History of stroke with current residual effects   . Cognitive impairment   . HLD (hyperlipidemia)   . HTN (hypertension)     Past Surgical History:  Procedure Laterality Date  . ANKLE SURGERY  1990s   right foot with plate and screws  . CORONARY ARTERY BYPASS GRAFT N/A 02/24/2017   3v Procedure: CORONARY ARTERY BYPASS GRAFTING (CABG) x 3 ON PUMP USING LEFT INTERNAL MAMMARY ARTERY TO LEFT ANTERIOR DESENDING CORNARY ARTERY, RIGHT GREATER SAPHENOUS VEIN TO LEFT CIRCUMFLEX ARTERY AND POSTERIOR  DESENDING ARTERY. RIGHT GREATER SAPHENOUS VEIN OBTAINED VIA ENDOVEIN HARVEST.;  Surgeon: Delight OvensGerhardt, Edward B, MD  . IABP INSERTION N/A 02/21/2017   Procedure: IABP Insertion;  Surgeon: Yvonne KendallEnd, Christopher, MD;  Location: [facility] INVASIVE CV LAB;  Service: Cardiovascular;  Laterality: N/A;  . LEFT HEART CATH AND CORONARY ANGIOGRAPHY N/A 02/21/2017   Procedure: LEFT HEART CATH AND CORONARY ANGIOGRAPHY;  Surgeon: Yvonne KendallEnd, Christopher, MD;  Location: [facility] INVASIVE CV LAB;  Service: Cardiovascular;  Laterality: N/A;  .  TEE WITHOUT CARDIOVERSION N/A 02/24/2017   Procedure: TRANSESOPHAGEAL ECHOCARDIOGRAM (TEE);  Surgeon: Delight OvensGerhardt, Edward B, MD;  [facility];  Service: Open Heart Surgery;  Laterality: N/A;       Family History  Problem Relation Age of Onset  . Alzheimer's disease Maternal Grandfather   . Cancer Mother        lymphoma  . Alcohol abuse Father        smoker  . Coronary artery disease Neg Hx   . Stroke Neg Hx   . Diabetes Neg Hx     Social History   Tobacco Use  . Smoking status: Never Smoker  . Smokeless tobacco: Former Clinical biochemistUser  Vaping Use  . Vaping Use: Never used  Substance Use Topics  . Alcohol use: Not Currently  . Drug use: No    Home Medications Prior to Admission medications   Medication Sig Start Date End Date Taking? Authorizing Provider  atorvastatin (LIPITOR) 80 MG tablet TAKE 1 TABLET BY MOUTH EVERY DAY 09/08/19   [name], [name], MD  Cyanocobalamin (VITAMIN B-12) 1000 MCG SUBL Place 1,000 mcg under the tongue daily.    [provider]  diphenoxylate-atropine (LOMOTIL) 2.5-0.025 MG tablet Take 1 tablet by mouth 2 (two) times daily as needed for diarrhea or loose stools. 10/25/19   [name], [name], MD  divalproex (DEPAKOTE) 500 MG DR tablet Take 1 tablet (500 mg total) by mouth 2 (two) times daily. 06/27/19 09/25/19  Dorcas CarrowGhimire, Kuber, MD  Lacosamide (VIMPAT) 100 MG TABS Take 200 mg at 8 am and 200 mg at 10 pm daily. 08/16/19   Love, Evlyn KannerPamela S, PA-C  levETIRAcetam (KEPPRA) 750 MG tablet TAKE 2 TABLETS (1,500 MG TOTAL) BY MOUTH 2 (TWO) TIMES DAILY. 10/18/19   Ihor AustinMcCue, Jessica, NP  Multiple Vitamin (MULTIVITAMIN WITH MINERALS) TABS tablet Take 1 tablet by mouth daily. 12/26/17   [name], [name], MD  pantoprazole (PROTONIX) 40 MG tablet Take 1 tablet (40 mg total) by mouth at bedtime. 07/26/19   Briant CedarEzenduka, Nkeiruka J, MD  protective barrier (RESTORE) CREA Apply 1 application topically 2 (two) times daily. And prn after pericare to sacrum/buttocks. 08/17/19   Love, Evlyn KannerPamela  S, PA-C  sertraline (ZOLOFT) 25 MG tablet TAKE 1 TABLET BY MOUTH EVERY DAY Patient taking differently: Take 25 mg by mouth daily.  04/26/19   [name], [name], MD  ziprasidone (GEODON) 40 MG capsule TAKE 1 CAPSULE (40 MG TOTAL) BY MOUTH AT BEDTIME. 09/22/19   [name], [name], MD    Allergies    Losartan  Review of Systems   Review of Systems  Constitutional: Positive for activity change.  Respiratory: Negative for shortness of breath.   Cardiovascular: Negative for chest pain.  Gastrointestinal: Positive for diarrhea. Negative for nausea and vomiting.  Allergic/Immunologic: Negative for immunocompromised state.  All other systems reviewed and are negative.   Physical Exam Updated Vital Signs BP 139/77 (BP Location: Right Arm)   Pulse 67   Temp 98.1 F (36.7 C) (Oral)   Resp 13   SpO2 99%  Physical Exam Vitals and nursing note reviewed.  Constitutional:      Appearance: He is well-developed.  HENT:     Head: Atraumatic.     Mouth/Throat:     Mouth: Mucous membranes are moist.  Cardiovascular:     Rate and Rhythm: Normal rate.  Pulmonary:     Effort: Pulmonary effort is normal.  Musculoskeletal:     Cervical back: Neck supple.  Skin:    General: Skin is warm.  Neurological:     Mental Status: He is alert and oriented to person, place, and time.     ED Results / Procedures / Treatments   Labs (all labs ordered are listed, but only abnormal results are displayed) Labs Reviewed  BASIC METABOLIC PANEL - Abnormal; Notable for the following components:      Result Value   Creatinine, Ser 0.52 (*)    Calcium 8.0 (*)    All other components within normal limits  CBC WITH DIFFERENTIAL/PLATELET - Abnormal; Notable for the following components:   RBC 3.67 (*)    Hemoglobin 11.2 (*)    HCT 34.9 (*)    All other components within normal limits  VITAMIN B12 - Abnormal; Notable for the following components:   Vitamin B-12 1,597 (*)    All other components within  normal limits  C-REACTIVE PROTEIN - Abnormal; Notable for the following components:   CRP 1.9 (*)    All other components within normal limits  MAGNESIUM  PHOSPHORUS  VALPROIC ACID LEVEL  SEDIMENTATION RATE  TSH  IRON  SEDIMENTATION RATE    EKG EKG Interpretation  Date/Time:  Thursday October 28 2019 15:37:34 EDT Ventricular Rate:  74 PR Interval:    QRS Duration: 92 QT Interval:  376 QTC Calculation: 418 R Axis:   -46 Text Interpretation: Sinus rhythm Left anterior fascicular block Low voltage, precordial leads No acute changes No significant change since last tracing Confirmed by Derwood Kaplan [phone]) on 10/28/2019 5:02:10 PM   Radiology DG Abdomen 1 View  Result Date: 10/28/2019 CLINICAL DATA:  Small-bowel obstruction. EXAM: ABDOMEN - 1 VIEW COMPARISON:  August 11, 2017 FINDINGS: The bowel gas pattern is nonspecific with mild gaseous distention of loops of small bowel and colon scattered throughout the abdomen. There is no definite pneumatosis or free air. Degenerative changes are noted of the lumbar spine and hips. There is no definite radiopaque kidney stone. IMPRESSION: Nonobstructive bowel gas pattern. Mild gaseous distention of loops of small bowel and colon scattered throughout the abdomen. Electronically Signed   By: Katherine Mantle M.D.   On: 10/28/2019 18:04    Procedures Procedures (including critical care time)  Medications Ordered in ED Medications  lactated ringers bolus 1,000 mL (0 mLs Intravenous Stopped 10/28/19 1810)  magnesium oxide (MAG-OX) tablet 800 mg (800 mg Oral Given 10/28/19 1932)    ED Course  I have reviewed the triage vital signs and the nursing notes.  Pertinent labs & imaging results that were available during my care of the patient were reviewed by me and considered in my medical decision making (see chart for details).    MDM Rules/Calculators/A&P                          63 year old comes in a chief complaint of diarrhea.   Patient has been having diarrhea for the last several days now.  It appears that he is already plugged in with his PCP and there is ongoing discussion of  GI follow-up.  I discussed the results with patient's sister and the patient.  His lab results are overall reassuring.  No evidence of AKI or significant electrolyte abnormalities.  I have ordered the labs that were supposed to be drawn by home health.  Patient will be discharged.  I discussed the case with Roberto Scales, GI.  They will be happy to see the patient, and have requested that the family call for the appointment.  I have put the instructions in the discharge paperwork.  Final Clinical Impression(s) / ED Diagnoses Final diagnoses:  Chronic diarrhea    Rx / DC Orders ED Discharge Orders    None       Derwood Kaplan, MD 10/28/19 2048

## 2019-10-28 NOTE — ED Notes (Signed)
At change of shift, pt resting in stretcher with nadn, vss on ccm, pt has no complaints, no diarrhea at this time. pt waiting for ptar for discharge.

## 2019-10-30 ENCOUNTER — Other Ambulatory Visit: Payer: Self-pay | Admitting: Family Medicine

## 2019-11-01 ENCOUNTER — Encounter: Payer: Self-pay | Admitting: Gastroenterology

## 2019-11-02 DIAGNOSIS — N319 Neuromuscular dysfunction of bladder, unspecified: Secondary | ICD-10-CM | POA: Diagnosis not present

## 2019-11-02 DIAGNOSIS — R1310 Dysphagia, unspecified: Secondary | ICD-10-CM | POA: Diagnosis not present

## 2019-11-02 DIAGNOSIS — I69122 Dysarthria following nontraumatic intracerebral hemorrhage: Secondary | ICD-10-CM | POA: Diagnosis not present

## 2019-11-02 DIAGNOSIS — I480 Paroxysmal atrial fibrillation: Secondary | ICD-10-CM | POA: Diagnosis not present

## 2019-11-02 DIAGNOSIS — I509 Heart failure, unspecified: Secondary | ICD-10-CM | POA: Diagnosis not present

## 2019-11-02 DIAGNOSIS — K219 Gastro-esophageal reflux disease without esophagitis: Secondary | ICD-10-CM | POA: Diagnosis not present

## 2019-11-02 DIAGNOSIS — I69191 Dysphagia following nontraumatic intracerebral hemorrhage: Secondary | ICD-10-CM | POA: Diagnosis not present

## 2019-11-02 DIAGNOSIS — E785 Hyperlipidemia, unspecified: Secondary | ICD-10-CM | POA: Diagnosis not present

## 2019-11-02 DIAGNOSIS — I69192 Facial weakness following nontraumatic intracerebral hemorrhage: Secondary | ICD-10-CM | POA: Diagnosis not present

## 2019-11-02 DIAGNOSIS — I69154 Hemiplegia and hemiparesis following nontraumatic intracerebral hemorrhage affecting left non-dominant side: Secondary | ICD-10-CM | POA: Diagnosis not present

## 2019-11-02 DIAGNOSIS — I252 Old myocardial infarction: Secondary | ICD-10-CM | POA: Diagnosis not present

## 2019-11-02 DIAGNOSIS — I2510 Atherosclerotic heart disease of native coronary artery without angina pectoris: Secondary | ICD-10-CM | POA: Diagnosis not present

## 2019-11-02 DIAGNOSIS — Z86718 Personal history of other venous thrombosis and embolism: Secondary | ICD-10-CM | POA: Diagnosis not present

## 2019-11-02 DIAGNOSIS — I110 Hypertensive heart disease with heart failure: Secondary | ICD-10-CM | POA: Diagnosis not present

## 2019-11-03 ENCOUNTER — Telehealth: Payer: Self-pay

## 2019-11-03 DIAGNOSIS — I69122 Dysarthria following nontraumatic intracerebral hemorrhage: Secondary | ICD-10-CM | POA: Diagnosis not present

## 2019-11-03 DIAGNOSIS — I69154 Hemiplegia and hemiparesis following nontraumatic intracerebral hemorrhage affecting left non-dominant side: Secondary | ICD-10-CM | POA: Diagnosis not present

## 2019-11-03 DIAGNOSIS — R1310 Dysphagia, unspecified: Secondary | ICD-10-CM | POA: Diagnosis not present

## 2019-11-03 DIAGNOSIS — I69191 Dysphagia following nontraumatic intracerebral hemorrhage: Secondary | ICD-10-CM | POA: Diagnosis not present

## 2019-11-03 DIAGNOSIS — E785 Hyperlipidemia, unspecified: Secondary | ICD-10-CM | POA: Diagnosis not present

## 2019-11-03 DIAGNOSIS — I509 Heart failure, unspecified: Secondary | ICD-10-CM | POA: Diagnosis not present

## 2019-11-03 DIAGNOSIS — I2510 Atherosclerotic heart disease of native coronary artery without angina pectoris: Secondary | ICD-10-CM | POA: Diagnosis not present

## 2019-11-03 DIAGNOSIS — I480 Paroxysmal atrial fibrillation: Secondary | ICD-10-CM | POA: Diagnosis not present

## 2019-11-03 DIAGNOSIS — Z86718 Personal history of other venous thrombosis and embolism: Secondary | ICD-10-CM | POA: Diagnosis not present

## 2019-11-03 DIAGNOSIS — N319 Neuromuscular dysfunction of bladder, unspecified: Secondary | ICD-10-CM | POA: Diagnosis not present

## 2019-11-03 DIAGNOSIS — K219 Gastro-esophageal reflux disease without esophagitis: Secondary | ICD-10-CM | POA: Diagnosis not present

## 2019-11-03 DIAGNOSIS — I110 Hypertensive heart disease with heart failure: Secondary | ICD-10-CM | POA: Diagnosis not present

## 2019-11-03 DIAGNOSIS — I252 Old myocardial infarction: Secondary | ICD-10-CM | POA: Diagnosis not present

## 2019-11-03 DIAGNOSIS — I69192 Facial weakness following nontraumatic intracerebral hemorrhage: Secondary | ICD-10-CM | POA: Diagnosis not present

## 2019-11-03 NOTE — Telephone Encounter (Signed)
Agree with this. Thanks.  

## 2019-11-03 NOTE — Telephone Encounter (Signed)
Spoke with Loraine Leriche informing him Dr. [name] is giving verbal orders for services requested for pt.  Expresses his thanks.

## 2019-11-03 NOTE — Telephone Encounter (Signed)
Call received from [facility] would like verbal order for home health PT  Once a week for 6 weeks followed by twice a week for 2 weeks. Starting on 11/07/2019.

## 2019-11-06 ENCOUNTER — Other Ambulatory Visit: Payer: Self-pay | Admitting: Family Medicine

## 2019-11-09 DIAGNOSIS — I69154 Hemiplegia and hemiparesis following nontraumatic intracerebral hemorrhage affecting left non-dominant side: Secondary | ICD-10-CM | POA: Diagnosis not present

## 2019-11-09 DIAGNOSIS — I110 Hypertensive heart disease with heart failure: Secondary | ICD-10-CM | POA: Diagnosis not present

## 2019-11-09 DIAGNOSIS — G40901 Epilepsy, unspecified, not intractable, with status epilepticus: Secondary | ICD-10-CM

## 2019-11-09 DIAGNOSIS — I69191 Dysphagia following nontraumatic intracerebral hemorrhage: Secondary | ICD-10-CM | POA: Diagnosis not present

## 2019-11-09 DIAGNOSIS — N319 Neuromuscular dysfunction of bladder, unspecified: Secondary | ICD-10-CM | POA: Diagnosis not present

## 2019-11-09 DIAGNOSIS — E669 Obesity, unspecified: Secondary | ICD-10-CM

## 2019-11-09 DIAGNOSIS — I252 Old myocardial infarction: Secondary | ICD-10-CM | POA: Diagnosis not present

## 2019-11-09 DIAGNOSIS — Z6831 Body mass index (BMI) 31.0-31.9, adult: Secondary | ICD-10-CM

## 2019-11-09 DIAGNOSIS — E785 Hyperlipidemia, unspecified: Secondary | ICD-10-CM | POA: Diagnosis not present

## 2019-11-09 DIAGNOSIS — I509 Heart failure, unspecified: Secondary | ICD-10-CM | POA: Diagnosis not present

## 2019-11-09 DIAGNOSIS — I69122 Dysarthria following nontraumatic intracerebral hemorrhage: Secondary | ICD-10-CM | POA: Diagnosis not present

## 2019-11-09 DIAGNOSIS — F419 Anxiety disorder, unspecified: Secondary | ICD-10-CM

## 2019-11-09 DIAGNOSIS — Z86718 Personal history of other venous thrombosis and embolism: Secondary | ICD-10-CM | POA: Diagnosis not present

## 2019-11-09 DIAGNOSIS — R32 Unspecified urinary incontinence: Secondary | ICD-10-CM

## 2019-11-09 DIAGNOSIS — F319 Bipolar disorder, unspecified: Secondary | ICD-10-CM

## 2019-11-09 DIAGNOSIS — K219 Gastro-esophageal reflux disease without esophagitis: Secondary | ICD-10-CM | POA: Diagnosis not present

## 2019-11-09 DIAGNOSIS — I69192 Facial weakness following nontraumatic intracerebral hemorrhage: Secondary | ICD-10-CM | POA: Diagnosis not present

## 2019-11-09 DIAGNOSIS — R41841 Cognitive communication deficit: Secondary | ICD-10-CM

## 2019-11-09 DIAGNOSIS — R1310 Dysphagia, unspecified: Secondary | ICD-10-CM | POA: Diagnosis not present

## 2019-11-09 DIAGNOSIS — I2510 Atherosclerotic heart disease of native coronary artery without angina pectoris: Secondary | ICD-10-CM | POA: Diagnosis not present

## 2019-11-09 DIAGNOSIS — R197 Diarrhea, unspecified: Secondary | ICD-10-CM | POA: Diagnosis not present

## 2019-11-09 DIAGNOSIS — I480 Paroxysmal atrial fibrillation: Secondary | ICD-10-CM | POA: Diagnosis not present

## 2019-11-10 DIAGNOSIS — Z86718 Personal history of other venous thrombosis and embolism: Secondary | ICD-10-CM | POA: Diagnosis not present

## 2019-11-10 DIAGNOSIS — I509 Heart failure, unspecified: Secondary | ICD-10-CM | POA: Diagnosis not present

## 2019-11-10 DIAGNOSIS — R1310 Dysphagia, unspecified: Secondary | ICD-10-CM | POA: Diagnosis not present

## 2019-11-10 DIAGNOSIS — I2510 Atherosclerotic heart disease of native coronary artery without angina pectoris: Secondary | ICD-10-CM | POA: Diagnosis not present

## 2019-11-10 DIAGNOSIS — I69154 Hemiplegia and hemiparesis following nontraumatic intracerebral hemorrhage affecting left non-dominant side: Secondary | ICD-10-CM | POA: Diagnosis not present

## 2019-11-10 DIAGNOSIS — R197 Diarrhea, unspecified: Secondary | ICD-10-CM | POA: Diagnosis not present

## 2019-11-10 DIAGNOSIS — I69122 Dysarthria following nontraumatic intracerebral hemorrhage: Secondary | ICD-10-CM | POA: Diagnosis not present

## 2019-11-10 DIAGNOSIS — N319 Neuromuscular dysfunction of bladder, unspecified: Secondary | ICD-10-CM | POA: Diagnosis not present

## 2019-11-10 DIAGNOSIS — K219 Gastro-esophageal reflux disease without esophagitis: Secondary | ICD-10-CM | POA: Diagnosis not present

## 2019-11-10 DIAGNOSIS — I110 Hypertensive heart disease with heart failure: Secondary | ICD-10-CM | POA: Diagnosis not present

## 2019-11-10 DIAGNOSIS — I69192 Facial weakness following nontraumatic intracerebral hemorrhage: Secondary | ICD-10-CM | POA: Diagnosis not present

## 2019-11-10 DIAGNOSIS — E785 Hyperlipidemia, unspecified: Secondary | ICD-10-CM | POA: Diagnosis not present

## 2019-11-10 DIAGNOSIS — I252 Old myocardial infarction: Secondary | ICD-10-CM | POA: Diagnosis not present

## 2019-11-10 DIAGNOSIS — I69191 Dysphagia following nontraumatic intracerebral hemorrhage: Secondary | ICD-10-CM | POA: Diagnosis not present

## 2019-11-10 DIAGNOSIS — I480 Paroxysmal atrial fibrillation: Secondary | ICD-10-CM | POA: Diagnosis not present

## 2019-11-12 DIAGNOSIS — I2510 Atherosclerotic heart disease of native coronary artery without angina pectoris: Secondary | ICD-10-CM | POA: Diagnosis not present

## 2019-11-12 DIAGNOSIS — I69154 Hemiplegia and hemiparesis following nontraumatic intracerebral hemorrhage affecting left non-dominant side: Secondary | ICD-10-CM | POA: Diagnosis not present

## 2019-11-12 DIAGNOSIS — N319 Neuromuscular dysfunction of bladder, unspecified: Secondary | ICD-10-CM | POA: Diagnosis not present

## 2019-11-12 DIAGNOSIS — I252 Old myocardial infarction: Secondary | ICD-10-CM | POA: Diagnosis not present

## 2019-11-12 DIAGNOSIS — I480 Paroxysmal atrial fibrillation: Secondary | ICD-10-CM | POA: Diagnosis not present

## 2019-11-12 DIAGNOSIS — I69192 Facial weakness following nontraumatic intracerebral hemorrhage: Secondary | ICD-10-CM | POA: Diagnosis not present

## 2019-11-12 DIAGNOSIS — R1310 Dysphagia, unspecified: Secondary | ICD-10-CM | POA: Diagnosis not present

## 2019-11-12 DIAGNOSIS — I69191 Dysphagia following nontraumatic intracerebral hemorrhage: Secondary | ICD-10-CM | POA: Diagnosis not present

## 2019-11-12 DIAGNOSIS — K219 Gastro-esophageal reflux disease without esophagitis: Secondary | ICD-10-CM | POA: Diagnosis not present

## 2019-11-12 DIAGNOSIS — I110 Hypertensive heart disease with heart failure: Secondary | ICD-10-CM | POA: Diagnosis not present

## 2019-11-12 DIAGNOSIS — R197 Diarrhea, unspecified: Secondary | ICD-10-CM | POA: Diagnosis not present

## 2019-11-12 DIAGNOSIS — I69122 Dysarthria following nontraumatic intracerebral hemorrhage: Secondary | ICD-10-CM | POA: Diagnosis not present

## 2019-11-12 DIAGNOSIS — Z86718 Personal history of other venous thrombosis and embolism: Secondary | ICD-10-CM | POA: Diagnosis not present

## 2019-11-12 DIAGNOSIS — I509 Heart failure, unspecified: Secondary | ICD-10-CM | POA: Diagnosis not present

## 2019-11-12 DIAGNOSIS — E785 Hyperlipidemia, unspecified: Secondary | ICD-10-CM | POA: Diagnosis not present

## 2019-11-15 ENCOUNTER — Telehealth: Payer: Self-pay | Admitting: Adult Health

## 2019-11-15 ENCOUNTER — Telehealth: Payer: Self-pay

## 2019-11-15 DIAGNOSIS — Z86718 Personal history of other venous thrombosis and embolism: Secondary | ICD-10-CM | POA: Diagnosis not present

## 2019-11-15 DIAGNOSIS — I252 Old myocardial infarction: Secondary | ICD-10-CM | POA: Diagnosis not present

## 2019-11-15 DIAGNOSIS — I110 Hypertensive heart disease with heart failure: Secondary | ICD-10-CM | POA: Diagnosis not present

## 2019-11-15 DIAGNOSIS — I610 Nontraumatic intracerebral hemorrhage in hemisphere, subcortical: Secondary | ICD-10-CM | POA: Diagnosis not present

## 2019-11-15 DIAGNOSIS — K219 Gastro-esophageal reflux disease without esophagitis: Secondary | ICD-10-CM | POA: Diagnosis not present

## 2019-11-15 DIAGNOSIS — I69154 Hemiplegia and hemiparesis following nontraumatic intracerebral hemorrhage affecting left non-dominant side: Secondary | ICD-10-CM | POA: Diagnosis not present

## 2019-11-15 DIAGNOSIS — I69191 Dysphagia following nontraumatic intracerebral hemorrhage: Secondary | ICD-10-CM | POA: Diagnosis not present

## 2019-11-15 DIAGNOSIS — N319 Neuromuscular dysfunction of bladder, unspecified: Secondary | ICD-10-CM | POA: Diagnosis not present

## 2019-11-15 DIAGNOSIS — I480 Paroxysmal atrial fibrillation: Secondary | ICD-10-CM | POA: Diagnosis not present

## 2019-11-15 DIAGNOSIS — E785 Hyperlipidemia, unspecified: Secondary | ICD-10-CM | POA: Diagnosis not present

## 2019-11-15 DIAGNOSIS — I69122 Dysarthria following nontraumatic intracerebral hemorrhage: Secondary | ICD-10-CM | POA: Diagnosis not present

## 2019-11-15 DIAGNOSIS — I2510 Atherosclerotic heart disease of native coronary artery without angina pectoris: Secondary | ICD-10-CM | POA: Diagnosis not present

## 2019-11-15 DIAGNOSIS — I69354 Hemiplegia and hemiparesis following cerebral infarction affecting left non-dominant side: Secondary | ICD-10-CM | POA: Diagnosis not present

## 2019-11-15 DIAGNOSIS — I69192 Facial weakness following nontraumatic intracerebral hemorrhage: Secondary | ICD-10-CM | POA: Diagnosis not present

## 2019-11-15 DIAGNOSIS — R1310 Dysphagia, unspecified: Secondary | ICD-10-CM | POA: Diagnosis not present

## 2019-11-15 DIAGNOSIS — R197 Diarrhea, unspecified: Secondary | ICD-10-CM | POA: Diagnosis not present

## 2019-11-15 DIAGNOSIS — I509 Heart failure, unspecified: Secondary | ICD-10-CM | POA: Diagnosis not present

## 2019-11-15 NOTE — Telephone Encounter (Signed)
Attempted to call pt, LVM for call back  °

## 2019-11-16 ENCOUNTER — Telehealth: Payer: Medicare Other | Admitting: Adult Health

## 2019-11-17 ENCOUNTER — Telehealth: Payer: Medicare Other | Admitting: Adult Health

## 2019-11-17 DIAGNOSIS — I2510 Atherosclerotic heart disease of native coronary artery without angina pectoris: Secondary | ICD-10-CM | POA: Diagnosis not present

## 2019-11-17 DIAGNOSIS — I252 Old myocardial infarction: Secondary | ICD-10-CM | POA: Diagnosis not present

## 2019-11-17 DIAGNOSIS — I509 Heart failure, unspecified: Secondary | ICD-10-CM | POA: Diagnosis not present

## 2019-11-17 DIAGNOSIS — I69192 Facial weakness following nontraumatic intracerebral hemorrhage: Secondary | ICD-10-CM | POA: Diagnosis not present

## 2019-11-17 DIAGNOSIS — I110 Hypertensive heart disease with heart failure: Secondary | ICD-10-CM | POA: Diagnosis not present

## 2019-11-17 DIAGNOSIS — R1310 Dysphagia, unspecified: Secondary | ICD-10-CM | POA: Diagnosis not present

## 2019-11-17 DIAGNOSIS — I69191 Dysphagia following nontraumatic intracerebral hemorrhage: Secondary | ICD-10-CM | POA: Diagnosis not present

## 2019-11-17 DIAGNOSIS — I69122 Dysarthria following nontraumatic intracerebral hemorrhage: Secondary | ICD-10-CM | POA: Diagnosis not present

## 2019-11-17 DIAGNOSIS — I480 Paroxysmal atrial fibrillation: Secondary | ICD-10-CM | POA: Diagnosis not present

## 2019-11-17 DIAGNOSIS — E785 Hyperlipidemia, unspecified: Secondary | ICD-10-CM | POA: Diagnosis not present

## 2019-11-17 DIAGNOSIS — I69154 Hemiplegia and hemiparesis following nontraumatic intracerebral hemorrhage affecting left non-dominant side: Secondary | ICD-10-CM | POA: Diagnosis not present

## 2019-11-17 DIAGNOSIS — N319 Neuromuscular dysfunction of bladder, unspecified: Secondary | ICD-10-CM | POA: Diagnosis not present

## 2019-11-17 DIAGNOSIS — Z86718 Personal history of other venous thrombosis and embolism: Secondary | ICD-10-CM | POA: Diagnosis not present

## 2019-11-17 DIAGNOSIS — R197 Diarrhea, unspecified: Secondary | ICD-10-CM | POA: Diagnosis not present

## 2019-11-17 DIAGNOSIS — K219 Gastro-esophageal reflux disease without esophagitis: Secondary | ICD-10-CM | POA: Diagnosis not present

## 2019-11-17 NOTE — Progress Notes (Deleted)
[facility] [address] [place]. [place] [address] [phone]       OFFICE FOLLOW UP NOTE  [name]. [name] Cruz Date of Birth:  Sep 20, 1955 Medical Record Number:  [id number]   Referring MD:   Tomasa Hosteller  Reason for Referral:  seizures  No chief complaint on file.    HPI:   [name] Cruz is a 64 year old male with PMHx of PAF not on AC, bipolar disorder, hx of alcohol abuse, CAD s/p CABG, HTN, multiple hemorrhagic and ischemic strokes, seizure disorder and vascular cognitive impairment.  Previously seen in office almost 1 year ago in 10/2018 with recommended 57-month follow-up.  He unfortunately presented to [facility] ED on 07/19/2019 with left-sided weakness.  Personally reviewed hospitalization pertinent progress notes, lab work and imaging with summary provided.  Evaluated by Dr. Pearlean Brownie with stroke work-up revealing right basal ganglia hemorrhage but w/o significant HTN, concerning for hemorrhagic conversion given history of AF.  Previously on aspirin and discontinued given recent ICH.  History of A. Fib not on AC due to history of left MCA infarct with hemorrhagic conversion and per report, this should not be a contraindication for [facility] and recommended f/u OP with Dr. Pearlean Brownie to consider anticoagulation for stroke prevention once ICH resolves. Hx of seizures with continued use of Vimpat, Keppra and Depakote likely late effect of stroke. Hx of HTN initially treated with Cleviprex stabilized during admission and resumed home dose Coreg. Hx of HLD on atorvastatin 80 mg daily with LDL 78.  Other stroke risk factors include hx of strokes 2004 L MCA with hemorrhagic conversion, 06/2018 right cerebellum secondary to small vessel disease and 07/2018 right PCA territory stroke.  Other stroke risk factors include history of tobacco use, history of EtOH use, obesity, CAD s/p CABG and hx of UE DVT.  Evaluated by therapy revealing dysphagia, cognitive impairment, decreased visual perceptual  skills, decreased awareness of deficits, decreased balance and left-sided weakness therefore discharged to CIR for functional decline.  ICH - R BG hemorrhage but w/o significant HTN, concerning for hemorrhagic conversion given hx of AF  CT head R thalamocapsular hemorrhage w/ mild edema. Old L tempororparietal infarct.   MRI w/w/o stable 2.6 cm right BG hemorrhage  MRA no large vessel stenosis or occlusion.  Carotid Doppler no significant extracranial stenosis.  2D Echo normal ejection fraction.  No cardiac source of embolism.  UDS negative  LDL 78  HgbA1c 5.6  SCDs for VTE prophylaxis  aspirin 81 mg daily prior to admission, now on No antithrombotic given hmg  Therapy recommendations:  CIR  Disposition:  CIR  Today, 11/17/2019, [name] Cruz is being seen for hospital follow-up via virtual visit.  After 22 days of CIR, he was eventually discharged to SNF on 08/17/2019 as he continued to have cognitive and physical deficits affecting mobility and ADLs.  He has since returned home with his sister from SNF on 09/06/2019.       [name] Cruz is 81 year Caucasian male seen today for initial office consultation visit. Is a complaint by his mother. History is obtained from them and review of electronic medical records. I have personally viewed imaging films in PACS.[name] Cruz is an 64 y.o. male  With PMH significant for Seizures, hemorrhagic CVA 2004 ( residual cognitive deficits only), alcohol abuse who presented to [facility] via EMS with seizures.on 08/09/17 .Per family patient was in food [employer] where he didn't feel quite right spun around twice, fell to the floor and began to  have a seizure. Transported to [facility] via EMS. Upon arrival to [facility] patient had a second seizure witnessed by staff  That lasted approximately 30 seconds where his eyes rolled to the back of his head and he began having a tonic/clonic seizure. 1 mg ativan given at this time and patient was taken to CT. In route to CT patient  began having runs of V-tach. While in CT patient was post-ictal with roving eye movements, and then went into pulseless v-tach. At this time chest compressions were started. Once patient was back in room in the process of being intubated he began to have another tonic/clonic seizure. Patient was intubated without any medications. 1g of keppra was given, propofol started at 63mcg/min/kg ( unable to titrate up due to hypotension). Family states that patient does not have a history of seizures. Chart review revealed that [facility] Thad Ranger in 2014 for seizures. Patient was discharged on Dilantin at that time with follow-up  appointment with  Outpatient neurology [facility]. Patient last filled phenytoin 100 mg TID prescription in march 2019 and it would have run out in June. Questioning compliance.upon evaluation in the ED basic lab work and urinalysis were unremarkable..CT head: no intracranial abnormality, stable chronic ventriculomegaly with left temporal occipital encephalomalacia and central volume loss It was felt that the patient likely had seizures in setting of noncompliance off Dilantin and discontinuation. His old hemorrhagic stroke was felt to be the focus for his symptomatic seizures. He had at least 2 witnessed seizures in the ER possibly a third one. His cardiac monitor showed possibly ventricular tachycardia resulting in cardiac arrest. He was loaded with 1 g of Keppra with maintenance dose of 1 g twice daily.  Vimpat was subsequently added as well. EEG obtained on 08/09/17 showed frequent left frontotemporal seizures bridged by periodic lateralized discharges on the in interictal ictal continuum consistent with partial status epilepticus from the left frontotemporal region.Vimpat 200 mg every 12 hourly was added and when necessary lorazepam was also used. Patient underwent long-term EEG monitoring overnight with simultaneous video monitoring which recorded several electrographic seizures  arising from the right temporal region but no obvious clinical accompaniment to these electrographic seizures.follow-up prolonged video EEG monitoring showed no further evidence of clinical or subclinical seizures.patient was intubated and Sedated with propofol and Versed. However repeat video EEG recording on 08/12/17 showed persistent clinical or subclinical seizures present throughout the recording. Dilantin was added and patient was continued on Keppra and Vimpat. Further repeat EEG showed no further seizures. His sedation was withdrawn.patient was eventually extubated. He was discharged on Keppra and Dilantin and Vimpat was discontinued.   Initial visit 09/25/2017  Dr. Pearlean Brownie: the patient states his done well since discharge has not been taking Dilantin and said his been taking Keppra 1 g twice daily only. He is living at home with his family. His abnormal further witnessed seizures. His back to his baseline. He still complains of some minor pain in the back of his head from the fall related to the seizure and injury. 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 He has residual right-sided homonymous hemianopsia which is persistent. He does not drive. States his blood pressure is well controlled and today it is 114/72.  Interval history 01/15/2018: Patient is being seen today for 25-month follow-up visit and is accompanied by his mother.  Patient did have ED visit on 10/01/2017 with main complaint of right-sided headache without any  new neuro deficits or changes in behavior.  Initial BMP showed potassium 6.1 but on recheck 5.5.  CT head negative.  EKG unchanged.  Patient was discharged home in stable condition with recommendations of PCP follow-up for potassium recheck.  He was followed by his cardiologist with appointment on 11/2017 with recommendations of discontinuing Eliquis as he is completed 27-month therapy for DVT and initiated aspirin 81 mg  daily for secondary stroke prevention.  He continues this without side effects of bleeding or bruising.  He continues on atorvastatin for HLD management without side effects myalgias.  Blood pressure today 113/75.  He continues on Keppra 1000 mg twice daily without any recent seizure activity.  His mother is concerned as he has excessive daytime fatigue with frequent napping despite sleeping well at night.  She states this is been present since hospital discharge.  She is concerned that this possibly could be medication related.  Patient does stay active during the day with ambulating frequently.  He does currently live with his sister but is able to manage all ADLs independently.  He denies any recent falls.  He continues to have residual right homonymous hemianopsia and denies any worsening.  He does endorse superficial chest pain at incision site which has been present without any worsening since hospitalization.  He did have recent follow-up with cardiology without any further recommendations.  No further concerns at this time.  Denies new or worsening stroke/TIA symptoms.  Update 10/22/2018: [name]. Manninen is being seen today for hospital follow-up.  He presented to [facility] ED on 07/09/2018 with transient confusion and slurred speech while talking on the phone.  Neurology consulted with stroke work-up revealing incidental right cerebellar infarct has evidence of MRI secondary to small vessel disease worse but not felt to be source of confusion.  In addition to acute stroke, MRI also showed old left MCA infarcts and multiple scattered microhemorrhages along with small vessel disease and atrophy.  CTA head/neck showed right ICA 60% next density in the left ICA less than 50% stenosis.  2D echo unremarkable.  It was felt as though his symptoms are related to unwitnessed seizure activity with postictal confusion as EEG showed electrographic transient seizures.  Recommended increasing Vimpat dose to 100/200 mg twice daily  and continuation of Keppra 1500 mg daily and side effects with higher dosage.  Discharged on aspirin 325 mg daily and continuation of atorvastatin for secondary stroke prevention.  Did not recommend initiating Plavix due to evidence of multiple microhemorrhages. He returned on 08/07/2018 with sudden onset aphasia, dysarthria and facial droop which resolved prior to admission.  Neurology consulted with stroke work-up revealing punctate acute/subacute white matter infarcts involving the right forcep major, right PCA territory as evidenced on MRI, likely secondary to small vessel disease.  Initiated DAPT for 3 weeks and Plavix alone.  Recommended follow-up with Dr. Pearlean Brownie in regards to potentially initiating [facility] for history of PAF which was not previously started due to history of left MCA infarct with hemorrhagic conversion.  As current stroke etiology small vessel disease without evidence of atrial fibrillation on telemetry, decision to continue with DAPT at that time.  Recommended continuation of current AEDs with symptoms concerning for seizure activity but not conclusive.  Discharged home in stable condition. He returned on 08/19/2018 with brief episode of unresponsiveness with EMS reporting patient reported being out of Keppra for the past week.  Patient endorsed compliance but was only taking 1 tablet daily when he was supposed to be taking 2 tablets  twice daily.  MRI unremarkable for acute stroke. He returned on 08/22/2018 with altered mental status with aggression and paranoia.  Per ED note reviewed, presentation significantly different from prior presentations with seizures or strokes.  He required psychiatric evaluation and was transferred to [facility] for further psychiatric evaluation and treatment.  [name] Elting is being seen today for hospital follow-up regarding reoccurring stroke and seizure activity and is accompanied by his sister.  He has been doing well at home without any  reoccurring stroke/TIA symptoms or seizure activity.  He does endorse occasional speech difficulty but feels as though this has been improving.  He also has right peripheral vision loss which has been chronic from prior stroke.  He continues to live with his sister, Steward Drone, but is able to maintain majority of ADLs independently.  He does have assistance for IADLs.  Sister helps with medication administration with use of pillboxes.  Sister believes cognition/memory loss has been slowly declining.  MMSE today 9/30.  He continue to perseverate on the same thought content such as left-sided vision loss and then an area on his nose that continued to bleed while he was on blood thinners.  He has completed 3 weeks DAPT and currently on aspirin 81 mg daily along.  Not recommended by other providers to be on Plavix due to history of ICH and microhemorrhages.  Underlying history of PAF and not a candidate for anticoagulation due to microhemorrhages.  He continues on atorvastatin without myalgias.  Blood pressure today 140/84.  He has continued on Vimpat 100 mg a.m. and 200 mg p.m. along with Keppra 1500 mg twice daily tolerating well without seizure activity.  Mood has been stable with ongoing use of sertraline and Geodon which continues to be managed by PCP.  Patient is very pleasant during appointment and cooperative with history taking, memory evaluation and physical exam.  No further concerns at this time.     ROS:   14 system review of systems is positive for speech difficulty, memory loss, confusion and all systems negative  PMH:  Past Medical History:  Diagnosis Date  . Acute deep vein thrombosis (DVT) of left upper extremity (HCC)    L brachial and basilic veins, eliquis started 08/22/2017 to continue for 3 months total   . Cognitive impairment 2007   after stroke, saw rehab but told to stop because was too upsetting to him  . History of chicken pox   . HLD (hyperlipidemia)   . HTN (hypertension)   .  NSTEMI (non-ST elevated myocardial infarction) (HCC) 02/21/2017  . Obesity   . Stroke, hemorrhagic (HCC) 2007   thought 2/2 HTN (240sbp); residual cognitive impairment, loss of R peripheral field, no driving    Social History:  Social History   Socioeconomic History  . Marital status: Single    Spouse name: Not on file  . Number of children: 2  . Years of education: Not on file  . Highest education level: 10th grade  Occupational History    Comment: Unemployed   Tobacco Use  . Smoking status: Never Smoker  . Smokeless tobacco: Former Clinical biochemist  . Vaping Use: Never used  Substance and Sexual Activity  . Alcohol use: Not Currently  . Drug use: No  . Sexual activity: Not Currently  Other Topics Concern  . Not on file  Social History Narrative   Caffeine: 1 pot coffee   Lives with oldest sister in [place]   Occupation: disabled, was Holiday representative  worker   Does not drive.   Activity: no regular exercise   Diet: good water, drinks V8 juice   Right handed    Social Determinants of Health   Financial Resource Strain:   . Difficulty of Paying Living Expenses: Not on file  Food Insecurity:   . Worried About Programme researcher, broadcasting/film/videounning Out of Food in the Last Year: Not on file  . Ran Out of Food in the Last Year: Not on file  Transportation Needs:   . Lack of Transportation (Medical): Not on file  . Lack of Transportation (Non-Medical): Not on file  Physical Activity:   . Days of Exercise per Week: Not on file  . Minutes of Exercise per Session: Not on file  Stress:   . Feeling of Stress : Not on file  Social Connections:   . Frequency of Communication with Friends and Family: Not on file  . Frequency of Social Gatherings with Friends and Family: Not on file  . Attends Religious Services: Not on file  . Active Member of Clubs or Organizations: Not on file  . Attends BankerClub or Organization Meetings: Not on file  . Marital Status: Not on file  Intimate Partner Violence:   . Fear of  Current or Ex-Partner: Not on file  . Emotionally Abused: Not on file  . Physically Abused: Not on file  . Sexually Abused: Not on file    Medications:   Current Outpatient Medications on File Prior to Visit  Medication Sig Dispense Refill  . atorvastatin (LIPITOR) 80 MG tablet TAKE 1 TABLET BY MOUTH EVERY DAY 90 tablet 0  . Cyanocobalamin (VITAMIN B-12) 1000 MCG SUBL Place 1,000 mcg under the tongue daily.    . diphenoxylate-atropine (LOMOTIL) 2.5-0.025 MG tablet Take 1 tablet by mouth 2 (two) times daily as needed for diarrhea or loose stools. 30 tablet 0  . divalproex (DEPAKOTE) 500 MG DR tablet Take 1 tablet (500 mg total) by mouth 2 (two) times daily. 60 tablet 2  . Lacosamide (VIMPAT) 100 MG TABS Take 200 mg at 8 am and 200 mg at 10 pm daily. 120 tablet 0  . levETIRAcetam (KEPPRA) 750 MG tablet TAKE 2 TABLETS (1,500 MG TOTAL) BY MOUTH 2 (TWO) TIMES DAILY. 120 tablet 1  . Multiple Vitamin (MULTIVITAMIN WITH MINERALS) TABS tablet Take 1 tablet by mouth daily. 90 tablet 0  . pantoprazole (PROTONIX) 40 MG tablet Take 1 tablet (40 mg total) by mouth at bedtime.    . protective barrier (RESTORE) CREA Apply 1 application topically 2 (two) times daily. And prn after pericare to sacrum/buttocks.  0  . sertraline (ZOLOFT) 25 MG tablet TAKE 1 TABLET BY MOUTH EVERY DAY (Patient taking differently: Take 25 mg by mouth daily. ) 90 tablet 1  . ziprasidone (GEODON) 40 MG capsule TAKE 1 CAPSULE (40 MG TOTAL) BY MOUTH AT BEDTIME. 90 capsule 1   No current facility-administered medications on file prior to visit.    Allergies:   Allergies  Allergen Reactions  . Losartan Other (See Comments)    hyperkalemia    Physical Exam  There were no vitals filed for this visit. There is no height or weight on file to calculate BMI.  MMSE - Mini Mental State Exam 10/22/2018 06/17/2018  Not completed: - (No Data)  Orientation to time 2 -  Orientation to Place 1 -  Registration 2 -  Attention/ Calculation  0 -  Recall 2 -  Language- name 2 objects 2 -  Language- repeat 0 -  Language- follow 3 step command 0 -  Language- read & follow direction 0 -  Write a sentence 0 -  Copy design 0 -  Total score 9 -    General: well developed, well nourished, pleasant middle-age pleasant Caucasian male, seated, in no evident distress Head: head normocephalic and atraumatic.   Neck: supple with no carotid or supraclavicular bruits Cardiovascular: regular rate and rhythm, no murmurs Musculoskeletal: no deformity Skin:  no rash/petichiae  Vascular:  Normal pulses all extremities  Neurologic Exam Mental Status: Awake and fully alert.  Mild expressive aphasia.  Oriented to place and time. Recent and remote memoryi dminished.  Attention span, concentration and fund of knowledge diminished with continuously repeating and speaking off topic.  MMSE 9/30 as charted above.  Mood and affect appropriate.  Cranial Nerves: Pupils equal, briskly reactive to light. Extraocular movements full without nystagmus. Visual fields show dense right homonymous hemianopsia to confrontation. Hearing intact. Facial sensation intact. Face, tongue, palate moves normally and symmetrically.  Motor: Normal bulk and tone. Normal strength in all tested extremity muscles. Sensory.: intact to touch , pinprick , position and vibratory sensation.  Coordination: Rapid alternating movements normal in all extremities. Finger-to-nose and heel-to-shin performed accurately bilaterally. Gait and Station: Arises from chair without difficulty. Stance is normal. Gait demonstrates normal stride length and balance without use of assistive device.  Reflexes: 1+ and symmetric. Toes downgoing.       ASSESSMENT/PLAN:  64 year old Caucasian male with R BG ICH concern for hemorrhagic conversion given hx of AF 07/19/2019, punctate acute/subacute white matter infarcts within right PCA territory secondary to small vessel disease in 07/2018, right cerebellum  infarct secondary to small vessel disease 06/2018 with history of CAD status post CABG in 02/2016, DVT in 2019, seizure disorder late effect of stroke, HTN, HLD and PAF not on AC.      Hx of multiple strokes -Continue aspirin 81 mg and atorvastatin for secondary stroke prevention -Continue to follow-up with PCP in regards to HLD and HTN management -Continue to follow with cardiology for atrial fibrillation monitoring -not a candidate for anticoagulation due to history of ICH and multiple microhemorrhages -Continue to stay active and maintain a healthy diet -Continue to monitor blood pressure at home -Discussed secondary stroke prevention measures and importance of close PCP follow-up with maintaining strict control of hypertension with blood pressure goal below 130/90, diabetes with hemoglobin A1c goal below 6.5% and cholesterol with LDL cholesterol (bad cholesterol) goal below 70 mg/dL. I also advised the patient to eat a healthy diet with plenty of whole grains, cereals, fruits and vegetables, exercise regularly and maintain ideal body weight.  Vascular cognitive impairment   Seizures, late effect of stroke -Admission 05/27/2019-increase dosage of Vimpat to 200mg  BID with continuation of Keppra -admission 06/25/2019 - initiation of Depakote in addition to home doses of Vimpat and Keppra -Continue Vimpat 200 mg twice daily, Keppra 1500 mg twice daily and Depakote 500 mg twice daily for seizure prophylaxis -refill placed    Follow-up in 4 months or call earlier if needed   Greater than 50% time during this 25 minute consultation visit was spent on discussing recent hospitalizations with reoccurring stroke and seizures, evaluation of mental status with MMSE and discussion of results, discussion on ongoing secondary stroke prevention risk factor management and importance and answered all questions to patient and sisters satisfaction   08/25/2019, [facility]  [facility] [address] [place], [place]  Phone [phone] Fax [phone] Note:  This document was prepared with digital dictation and possible smart phrase technology. Any transcriptional errors that result from this process are unintentional.

## 2019-11-19 ENCOUNTER — Telehealth: Payer: Self-pay

## 2019-11-19 ENCOUNTER — Telehealth: Payer: Self-pay | Admitting: Neurology

## 2019-11-19 DIAGNOSIS — G40909 Epilepsy, unspecified, not intractable, without status epilepticus: Secondary | ICD-10-CM

## 2019-11-19 MED ORDER — VIMPAT 100 MG PO TABS
ORAL_TABLET | ORAL | 2 refills | Status: DC
Start: 2019-11-19 — End: 2019-11-22

## 2019-11-19 MED ORDER — LEVETIRACETAM 750 MG PO TABS: 1500.0000 mg | ORAL_TABLET | Freq: Two times a day (BID) | ORAL | 2 refills | Status: DC

## 2019-11-19 MED ORDER — DIVALPROEX SODIUM 500 MG PO DR TAB: 500.0000 mg | DELAYED_RELEASE_TABLET | Freq: Two times a day (BID) | ORAL | 2 refills | Status: DC

## 2019-11-19 NOTE — Telephone Encounter (Signed)
[name] DPR signed said that the pharmacy had been trying to get refill on depakote and Vimpat from neurology and neurology had not responded and there office closed today at 12 noon. I called [phone] neurology # given to me by Steward Drone and spoke with operator 10 and she said if med was for seizures she felt sure on call provider would fill at least thru weekend.Steward Drone voiced understanding and will call and speak with neurology about filling by oncall provider. Dr [name] said OK to send to neurology for refill. FYI to Dr [name].

## 2019-11-19 NOTE — Telephone Encounter (Signed)
19.30 , Friday-  Patient's sister cal;led and informed me that her brother is out of all meds- for seizure, no refills on lactosamide, valproate. CD

## 2019-11-22 DIAGNOSIS — N319 Neuromuscular dysfunction of bladder, unspecified: Secondary | ICD-10-CM | POA: Diagnosis not present

## 2019-11-22 DIAGNOSIS — I2510 Atherosclerotic heart disease of native coronary artery without angina pectoris: Secondary | ICD-10-CM | POA: Diagnosis not present

## 2019-11-22 DIAGNOSIS — I69154 Hemiplegia and hemiparesis following nontraumatic intracerebral hemorrhage affecting left non-dominant side: Secondary | ICD-10-CM | POA: Diagnosis not present

## 2019-11-22 DIAGNOSIS — I69191 Dysphagia following nontraumatic intracerebral hemorrhage: Secondary | ICD-10-CM | POA: Diagnosis not present

## 2019-11-22 DIAGNOSIS — I480 Paroxysmal atrial fibrillation: Secondary | ICD-10-CM | POA: Diagnosis not present

## 2019-11-22 DIAGNOSIS — I252 Old myocardial infarction: Secondary | ICD-10-CM | POA: Diagnosis not present

## 2019-11-22 DIAGNOSIS — Z86718 Personal history of other venous thrombosis and embolism: Secondary | ICD-10-CM | POA: Diagnosis not present

## 2019-11-22 DIAGNOSIS — R1310 Dysphagia, unspecified: Secondary | ICD-10-CM | POA: Diagnosis not present

## 2019-11-22 DIAGNOSIS — I110 Hypertensive heart disease with heart failure: Secondary | ICD-10-CM | POA: Diagnosis not present

## 2019-11-22 DIAGNOSIS — I509 Heart failure, unspecified: Secondary | ICD-10-CM | POA: Diagnosis not present

## 2019-11-22 DIAGNOSIS — I69192 Facial weakness following nontraumatic intracerebral hemorrhage: Secondary | ICD-10-CM | POA: Diagnosis not present

## 2019-11-22 DIAGNOSIS — E785 Hyperlipidemia, unspecified: Secondary | ICD-10-CM | POA: Diagnosis not present

## 2019-11-22 DIAGNOSIS — R197 Diarrhea, unspecified: Secondary | ICD-10-CM | POA: Diagnosis not present

## 2019-11-22 DIAGNOSIS — K219 Gastro-esophageal reflux disease without esophagitis: Secondary | ICD-10-CM | POA: Diagnosis not present

## 2019-11-22 DIAGNOSIS — I69122 Dysarthria following nontraumatic intracerebral hemorrhage: Secondary | ICD-10-CM | POA: Diagnosis not present

## 2019-11-22 MED ORDER — LACOSAMIDE 200 MG PO TABS: ORAL_TABLET | ORAL | 1 refills | Status: DC

## 2019-11-22 NOTE — Telephone Encounter (Signed)
Thank you for refilling.  Medications recently adjusted during hospitalization and he missed his prior follow-up visit.  Cicero Duck, can you please call patient and schedule a follow-up with Dr. Pearlean Brownie as requested during hospitalization.  Thank you.

## 2019-11-22 NOTE — Telephone Encounter (Signed)
Pt is receiving

## 2019-11-22 NOTE — Telephone Encounter (Signed)
Pt appears to have been recently dc fro hospital where they increased the Vimpat to 200mg  in morning and 200 mg in the evening. I have corrected the script to reduce the quantity of pills that the patient would need to take.  May require PA for this dose, will await for pharmacy to request if necessary.

## 2019-11-22 NOTE — Telephone Encounter (Signed)
After hospital d/c, script wrote as taking Vimpat 100mg  2 tabs BID. He has not had follow up since d/c. Will approve refill for Vimpat 200mg  BID per insurance request

## 2019-11-22 NOTE — Telephone Encounter (Signed)
Pt's sister, [name] called, pharmacy could not fill Lacosamide (VIMPAT) 100 MG TABS. Insurance would not cover any dosage over 300 mg. Would like a call from the nurse.

## 2019-11-22 NOTE — Telephone Encounter (Signed)
Noted. Neuro filled over weekend.

## 2019-11-23 NOTE — Telephone Encounter (Signed)
Was this FYI ? He is your patient.

## 2019-11-23 NOTE — Telephone Encounter (Signed)
thanks

## 2019-11-24 DIAGNOSIS — R1310 Dysphagia, unspecified: Secondary | ICD-10-CM | POA: Diagnosis not present

## 2019-11-24 DIAGNOSIS — Z86718 Personal history of other venous thrombosis and embolism: Secondary | ICD-10-CM | POA: Diagnosis not present

## 2019-11-24 DIAGNOSIS — R197 Diarrhea, unspecified: Secondary | ICD-10-CM | POA: Diagnosis not present

## 2019-11-24 DIAGNOSIS — E785 Hyperlipidemia, unspecified: Secondary | ICD-10-CM | POA: Diagnosis not present

## 2019-11-24 DIAGNOSIS — I252 Old myocardial infarction: Secondary | ICD-10-CM | POA: Diagnosis not present

## 2019-11-24 DIAGNOSIS — I69154 Hemiplegia and hemiparesis following nontraumatic intracerebral hemorrhage affecting left non-dominant side: Secondary | ICD-10-CM | POA: Diagnosis not present

## 2019-11-24 DIAGNOSIS — K219 Gastro-esophageal reflux disease without esophagitis: Secondary | ICD-10-CM | POA: Diagnosis not present

## 2019-11-24 DIAGNOSIS — I69191 Dysphagia following nontraumatic intracerebral hemorrhage: Secondary | ICD-10-CM | POA: Diagnosis not present

## 2019-11-24 DIAGNOSIS — I509 Heart failure, unspecified: Secondary | ICD-10-CM | POA: Diagnosis not present

## 2019-11-24 DIAGNOSIS — I2510 Atherosclerotic heart disease of native coronary artery without angina pectoris: Secondary | ICD-10-CM | POA: Diagnosis not present

## 2019-11-24 DIAGNOSIS — I69122 Dysarthria following nontraumatic intracerebral hemorrhage: Secondary | ICD-10-CM | POA: Diagnosis not present

## 2019-11-24 DIAGNOSIS — I69192 Facial weakness following nontraumatic intracerebral hemorrhage: Secondary | ICD-10-CM | POA: Diagnosis not present

## 2019-11-24 DIAGNOSIS — I110 Hypertensive heart disease with heart failure: Secondary | ICD-10-CM | POA: Diagnosis not present

## 2019-11-24 DIAGNOSIS — I480 Paroxysmal atrial fibrillation: Secondary | ICD-10-CM | POA: Diagnosis not present

## 2019-11-24 DIAGNOSIS — N319 Neuromuscular dysfunction of bladder, unspecified: Secondary | ICD-10-CM | POA: Diagnosis not present

## 2019-11-24 NOTE — Telephone Encounter (Signed)
Sorry sent in error.

## 2019-11-29 DIAGNOSIS — E785 Hyperlipidemia, unspecified: Secondary | ICD-10-CM | POA: Diagnosis not present

## 2019-11-29 DIAGNOSIS — I252 Old myocardial infarction: Secondary | ICD-10-CM | POA: Diagnosis not present

## 2019-11-29 DIAGNOSIS — I480 Paroxysmal atrial fibrillation: Secondary | ICD-10-CM | POA: Diagnosis not present

## 2019-11-29 DIAGNOSIS — I69122 Dysarthria following nontraumatic intracerebral hemorrhage: Secondary | ICD-10-CM | POA: Diagnosis not present

## 2019-11-29 DIAGNOSIS — I110 Hypertensive heart disease with heart failure: Secondary | ICD-10-CM | POA: Diagnosis not present

## 2019-11-29 DIAGNOSIS — R197 Diarrhea, unspecified: Secondary | ICD-10-CM | POA: Diagnosis not present

## 2019-11-29 DIAGNOSIS — Z86718 Personal history of other venous thrombosis and embolism: Secondary | ICD-10-CM | POA: Diagnosis not present

## 2019-11-29 DIAGNOSIS — I69192 Facial weakness following nontraumatic intracerebral hemorrhage: Secondary | ICD-10-CM | POA: Diagnosis not present

## 2019-11-29 DIAGNOSIS — N319 Neuromuscular dysfunction of bladder, unspecified: Secondary | ICD-10-CM | POA: Diagnosis not present

## 2019-11-29 DIAGNOSIS — I69154 Hemiplegia and hemiparesis following nontraumatic intracerebral hemorrhage affecting left non-dominant side: Secondary | ICD-10-CM | POA: Diagnosis not present

## 2019-11-29 DIAGNOSIS — I509 Heart failure, unspecified: Secondary | ICD-10-CM | POA: Diagnosis not present

## 2019-11-29 DIAGNOSIS — K219 Gastro-esophageal reflux disease without esophagitis: Secondary | ICD-10-CM | POA: Diagnosis not present

## 2019-11-29 DIAGNOSIS — I69191 Dysphagia following nontraumatic intracerebral hemorrhage: Secondary | ICD-10-CM | POA: Diagnosis not present

## 2019-11-29 DIAGNOSIS — R1310 Dysphagia, unspecified: Secondary | ICD-10-CM | POA: Diagnosis not present

## 2019-11-29 DIAGNOSIS — I2510 Atherosclerotic heart disease of native coronary artery without angina pectoris: Secondary | ICD-10-CM | POA: Diagnosis not present

## 2019-12-01 DIAGNOSIS — Z86718 Personal history of other venous thrombosis and embolism: Secondary | ICD-10-CM | POA: Diagnosis not present

## 2019-12-01 DIAGNOSIS — N319 Neuromuscular dysfunction of bladder, unspecified: Secondary | ICD-10-CM | POA: Diagnosis not present

## 2019-12-01 DIAGNOSIS — I69122 Dysarthria following nontraumatic intracerebral hemorrhage: Secondary | ICD-10-CM | POA: Diagnosis not present

## 2019-12-01 DIAGNOSIS — I69191 Dysphagia following nontraumatic intracerebral hemorrhage: Secondary | ICD-10-CM | POA: Diagnosis not present

## 2019-12-01 DIAGNOSIS — I69154 Hemiplegia and hemiparesis following nontraumatic intracerebral hemorrhage affecting left non-dominant side: Secondary | ICD-10-CM | POA: Diagnosis not present

## 2019-12-01 DIAGNOSIS — I509 Heart failure, unspecified: Secondary | ICD-10-CM | POA: Diagnosis not present

## 2019-12-01 DIAGNOSIS — R1310 Dysphagia, unspecified: Secondary | ICD-10-CM | POA: Diagnosis not present

## 2019-12-01 DIAGNOSIS — E785 Hyperlipidemia, unspecified: Secondary | ICD-10-CM | POA: Diagnosis not present

## 2019-12-01 DIAGNOSIS — I252 Old myocardial infarction: Secondary | ICD-10-CM | POA: Diagnosis not present

## 2019-12-01 DIAGNOSIS — K219 Gastro-esophageal reflux disease without esophagitis: Secondary | ICD-10-CM | POA: Diagnosis not present

## 2019-12-01 DIAGNOSIS — I69192 Facial weakness following nontraumatic intracerebral hemorrhage: Secondary | ICD-10-CM | POA: Diagnosis not present

## 2019-12-01 DIAGNOSIS — I2510 Atherosclerotic heart disease of native coronary artery without angina pectoris: Secondary | ICD-10-CM | POA: Diagnosis not present

## 2019-12-01 DIAGNOSIS — I480 Paroxysmal atrial fibrillation: Secondary | ICD-10-CM | POA: Diagnosis not present

## 2019-12-01 DIAGNOSIS — I110 Hypertensive heart disease with heart failure: Secondary | ICD-10-CM | POA: Diagnosis not present

## 2019-12-01 DIAGNOSIS — R197 Diarrhea, unspecified: Secondary | ICD-10-CM | POA: Diagnosis not present

## 2019-12-02 DIAGNOSIS — I509 Heart failure, unspecified: Secondary | ICD-10-CM | POA: Diagnosis not present

## 2019-12-02 DIAGNOSIS — R197 Diarrhea, unspecified: Secondary | ICD-10-CM | POA: Diagnosis not present

## 2019-12-02 DIAGNOSIS — N319 Neuromuscular dysfunction of bladder, unspecified: Secondary | ICD-10-CM | POA: Diagnosis not present

## 2019-12-02 DIAGNOSIS — K219 Gastro-esophageal reflux disease without esophagitis: Secondary | ICD-10-CM | POA: Diagnosis not present

## 2019-12-02 DIAGNOSIS — I2510 Atherosclerotic heart disease of native coronary artery without angina pectoris: Secondary | ICD-10-CM | POA: Diagnosis not present

## 2019-12-02 DIAGNOSIS — I252 Old myocardial infarction: Secondary | ICD-10-CM | POA: Diagnosis not present

## 2019-12-02 DIAGNOSIS — I69192 Facial weakness following nontraumatic intracerebral hemorrhage: Secondary | ICD-10-CM | POA: Diagnosis not present

## 2019-12-02 DIAGNOSIS — I110 Hypertensive heart disease with heart failure: Secondary | ICD-10-CM | POA: Diagnosis not present

## 2019-12-02 DIAGNOSIS — I69191 Dysphagia following nontraumatic intracerebral hemorrhage: Secondary | ICD-10-CM | POA: Diagnosis not present

## 2019-12-02 DIAGNOSIS — E785 Hyperlipidemia, unspecified: Secondary | ICD-10-CM | POA: Diagnosis not present

## 2019-12-02 DIAGNOSIS — I480 Paroxysmal atrial fibrillation: Secondary | ICD-10-CM | POA: Diagnosis not present

## 2019-12-02 DIAGNOSIS — Z86718 Personal history of other venous thrombosis and embolism: Secondary | ICD-10-CM | POA: Diagnosis not present

## 2019-12-02 DIAGNOSIS — I69122 Dysarthria following nontraumatic intracerebral hemorrhage: Secondary | ICD-10-CM | POA: Diagnosis not present

## 2019-12-02 DIAGNOSIS — I69154 Hemiplegia and hemiparesis following nontraumatic intracerebral hemorrhage affecting left non-dominant side: Secondary | ICD-10-CM | POA: Diagnosis not present

## 2019-12-02 DIAGNOSIS — R1310 Dysphagia, unspecified: Secondary | ICD-10-CM | POA: Diagnosis not present

## 2019-12-07 DIAGNOSIS — I2510 Atherosclerotic heart disease of native coronary artery without angina pectoris: Secondary | ICD-10-CM | POA: Diagnosis not present

## 2019-12-07 DIAGNOSIS — N319 Neuromuscular dysfunction of bladder, unspecified: Secondary | ICD-10-CM | POA: Diagnosis not present

## 2019-12-07 DIAGNOSIS — R1310 Dysphagia, unspecified: Secondary | ICD-10-CM | POA: Diagnosis not present

## 2019-12-07 DIAGNOSIS — E785 Hyperlipidemia, unspecified: Secondary | ICD-10-CM | POA: Diagnosis not present

## 2019-12-07 DIAGNOSIS — R197 Diarrhea, unspecified: Secondary | ICD-10-CM | POA: Diagnosis not present

## 2019-12-07 DIAGNOSIS — I480 Paroxysmal atrial fibrillation: Secondary | ICD-10-CM | POA: Diagnosis not present

## 2019-12-07 DIAGNOSIS — I509 Heart failure, unspecified: Secondary | ICD-10-CM | POA: Diagnosis not present

## 2019-12-07 DIAGNOSIS — I69192 Facial weakness following nontraumatic intracerebral hemorrhage: Secondary | ICD-10-CM | POA: Diagnosis not present

## 2019-12-07 DIAGNOSIS — I69122 Dysarthria following nontraumatic intracerebral hemorrhage: Secondary | ICD-10-CM | POA: Diagnosis not present

## 2019-12-07 DIAGNOSIS — I252 Old myocardial infarction: Secondary | ICD-10-CM | POA: Diagnosis not present

## 2019-12-07 DIAGNOSIS — I69154 Hemiplegia and hemiparesis following nontraumatic intracerebral hemorrhage affecting left non-dominant side: Secondary | ICD-10-CM | POA: Diagnosis not present

## 2019-12-07 DIAGNOSIS — I110 Hypertensive heart disease with heart failure: Secondary | ICD-10-CM | POA: Diagnosis not present

## 2019-12-07 DIAGNOSIS — K219 Gastro-esophageal reflux disease without esophagitis: Secondary | ICD-10-CM | POA: Diagnosis not present

## 2019-12-07 DIAGNOSIS — I69191 Dysphagia following nontraumatic intracerebral hemorrhage: Secondary | ICD-10-CM | POA: Diagnosis not present

## 2019-12-07 DIAGNOSIS — Z86718 Personal history of other venous thrombosis and embolism: Secondary | ICD-10-CM | POA: Diagnosis not present

## 2019-12-08 ENCOUNTER — Other Ambulatory Visit: Payer: Self-pay | Admitting: Family Medicine

## 2019-12-08 NOTE — Telephone Encounter (Signed)
E-scribed refill.  Plz schedule wellness, cpe and lab visits.  

## 2019-12-14 DIAGNOSIS — I69192 Facial weakness following nontraumatic intracerebral hemorrhage: Secondary | ICD-10-CM | POA: Diagnosis not present

## 2019-12-14 DIAGNOSIS — I69122 Dysarthria following nontraumatic intracerebral hemorrhage: Secondary | ICD-10-CM | POA: Diagnosis not present

## 2019-12-14 DIAGNOSIS — K219 Gastro-esophageal reflux disease without esophagitis: Secondary | ICD-10-CM | POA: Diagnosis not present

## 2019-12-14 DIAGNOSIS — I252 Old myocardial infarction: Secondary | ICD-10-CM | POA: Diagnosis not present

## 2019-12-14 DIAGNOSIS — R1310 Dysphagia, unspecified: Secondary | ICD-10-CM | POA: Diagnosis not present

## 2019-12-14 DIAGNOSIS — I69154 Hemiplegia and hemiparesis following nontraumatic intracerebral hemorrhage affecting left non-dominant side: Secondary | ICD-10-CM | POA: Diagnosis not present

## 2019-12-14 DIAGNOSIS — R197 Diarrhea, unspecified: Secondary | ICD-10-CM | POA: Diagnosis not present

## 2019-12-14 DIAGNOSIS — I509 Heart failure, unspecified: Secondary | ICD-10-CM | POA: Diagnosis not present

## 2019-12-14 DIAGNOSIS — I110 Hypertensive heart disease with heart failure: Secondary | ICD-10-CM | POA: Diagnosis not present

## 2019-12-14 DIAGNOSIS — N319 Neuromuscular dysfunction of bladder, unspecified: Secondary | ICD-10-CM | POA: Diagnosis not present

## 2019-12-14 DIAGNOSIS — I2510 Atherosclerotic heart disease of native coronary artery without angina pectoris: Secondary | ICD-10-CM | POA: Diagnosis not present

## 2019-12-14 DIAGNOSIS — Z86718 Personal history of other venous thrombosis and embolism: Secondary | ICD-10-CM | POA: Diagnosis not present

## 2019-12-14 DIAGNOSIS — E785 Hyperlipidemia, unspecified: Secondary | ICD-10-CM | POA: Diagnosis not present

## 2019-12-14 DIAGNOSIS — I69191 Dysphagia following nontraumatic intracerebral hemorrhage: Secondary | ICD-10-CM | POA: Diagnosis not present

## 2019-12-14 DIAGNOSIS — I480 Paroxysmal atrial fibrillation: Secondary | ICD-10-CM | POA: Diagnosis not present

## 2019-12-17 DIAGNOSIS — I509 Heart failure, unspecified: Secondary | ICD-10-CM | POA: Diagnosis not present

## 2019-12-17 DIAGNOSIS — I69192 Facial weakness following nontraumatic intracerebral hemorrhage: Secondary | ICD-10-CM | POA: Diagnosis not present

## 2019-12-17 DIAGNOSIS — E785 Hyperlipidemia, unspecified: Secondary | ICD-10-CM | POA: Diagnosis not present

## 2019-12-17 DIAGNOSIS — R1310 Dysphagia, unspecified: Secondary | ICD-10-CM | POA: Diagnosis not present

## 2019-12-17 DIAGNOSIS — I69154 Hemiplegia and hemiparesis following nontraumatic intracerebral hemorrhage affecting left non-dominant side: Secondary | ICD-10-CM | POA: Diagnosis not present

## 2019-12-17 DIAGNOSIS — I69191 Dysphagia following nontraumatic intracerebral hemorrhage: Secondary | ICD-10-CM | POA: Diagnosis not present

## 2019-12-17 DIAGNOSIS — I480 Paroxysmal atrial fibrillation: Secondary | ICD-10-CM | POA: Diagnosis not present

## 2019-12-17 DIAGNOSIS — I252 Old myocardial infarction: Secondary | ICD-10-CM | POA: Diagnosis not present

## 2019-12-17 DIAGNOSIS — I69122 Dysarthria following nontraumatic intracerebral hemorrhage: Secondary | ICD-10-CM | POA: Diagnosis not present

## 2019-12-17 DIAGNOSIS — I110 Hypertensive heart disease with heart failure: Secondary | ICD-10-CM | POA: Diagnosis not present

## 2019-12-17 DIAGNOSIS — R197 Diarrhea, unspecified: Secondary | ICD-10-CM | POA: Diagnosis not present

## 2019-12-17 DIAGNOSIS — Z86718 Personal history of other venous thrombosis and embolism: Secondary | ICD-10-CM | POA: Diagnosis not present

## 2019-12-17 DIAGNOSIS — I2510 Atherosclerotic heart disease of native coronary artery without angina pectoris: Secondary | ICD-10-CM | POA: Diagnosis not present

## 2019-12-17 DIAGNOSIS — K219 Gastro-esophageal reflux disease without esophagitis: Secondary | ICD-10-CM | POA: Diagnosis not present

## 2019-12-17 DIAGNOSIS — N319 Neuromuscular dysfunction of bladder, unspecified: Secondary | ICD-10-CM | POA: Diagnosis not present

## 2019-12-20 DIAGNOSIS — R1310 Dysphagia, unspecified: Secondary | ICD-10-CM | POA: Diagnosis not present

## 2019-12-20 DIAGNOSIS — I69154 Hemiplegia and hemiparesis following nontraumatic intracerebral hemorrhage affecting left non-dominant side: Secondary | ICD-10-CM | POA: Diagnosis not present

## 2019-12-20 DIAGNOSIS — N319 Neuromuscular dysfunction of bladder, unspecified: Secondary | ICD-10-CM | POA: Diagnosis not present

## 2019-12-20 DIAGNOSIS — I110 Hypertensive heart disease with heart failure: Secondary | ICD-10-CM | POA: Diagnosis not present

## 2019-12-20 DIAGNOSIS — I509 Heart failure, unspecified: Secondary | ICD-10-CM | POA: Diagnosis not present

## 2019-12-20 DIAGNOSIS — I480 Paroxysmal atrial fibrillation: Secondary | ICD-10-CM | POA: Diagnosis not present

## 2019-12-20 DIAGNOSIS — I252 Old myocardial infarction: Secondary | ICD-10-CM | POA: Diagnosis not present

## 2019-12-20 DIAGNOSIS — I69191 Dysphagia following nontraumatic intracerebral hemorrhage: Secondary | ICD-10-CM | POA: Diagnosis not present

## 2019-12-20 DIAGNOSIS — Z86718 Personal history of other venous thrombosis and embolism: Secondary | ICD-10-CM | POA: Diagnosis not present

## 2019-12-20 DIAGNOSIS — I69192 Facial weakness following nontraumatic intracerebral hemorrhage: Secondary | ICD-10-CM | POA: Diagnosis not present

## 2019-12-20 DIAGNOSIS — E785 Hyperlipidemia, unspecified: Secondary | ICD-10-CM | POA: Diagnosis not present

## 2019-12-20 DIAGNOSIS — I2510 Atherosclerotic heart disease of native coronary artery without angina pectoris: Secondary | ICD-10-CM | POA: Diagnosis not present

## 2019-12-20 DIAGNOSIS — R197 Diarrhea, unspecified: Secondary | ICD-10-CM | POA: Diagnosis not present

## 2019-12-20 DIAGNOSIS — K219 Gastro-esophageal reflux disease without esophagitis: Secondary | ICD-10-CM | POA: Diagnosis not present

## 2019-12-20 DIAGNOSIS — I69122 Dysarthria following nontraumatic intracerebral hemorrhage: Secondary | ICD-10-CM | POA: Diagnosis not present

## 2019-12-22 DIAGNOSIS — I69154 Hemiplegia and hemiparesis following nontraumatic intracerebral hemorrhage affecting left non-dominant side: Secondary | ICD-10-CM | POA: Diagnosis not present

## 2019-12-22 DIAGNOSIS — I110 Hypertensive heart disease with heart failure: Secondary | ICD-10-CM | POA: Diagnosis not present

## 2019-12-22 DIAGNOSIS — K219 Gastro-esophageal reflux disease without esophagitis: Secondary | ICD-10-CM | POA: Diagnosis not present

## 2019-12-22 DIAGNOSIS — N319 Neuromuscular dysfunction of bladder, unspecified: Secondary | ICD-10-CM | POA: Diagnosis not present

## 2019-12-22 DIAGNOSIS — Z86718 Personal history of other venous thrombosis and embolism: Secondary | ICD-10-CM | POA: Diagnosis not present

## 2019-12-22 DIAGNOSIS — R1310 Dysphagia, unspecified: Secondary | ICD-10-CM | POA: Diagnosis not present

## 2019-12-22 DIAGNOSIS — I252 Old myocardial infarction: Secondary | ICD-10-CM | POA: Diagnosis not present

## 2019-12-22 DIAGNOSIS — I69192 Facial weakness following nontraumatic intracerebral hemorrhage: Secondary | ICD-10-CM | POA: Diagnosis not present

## 2019-12-22 DIAGNOSIS — I69122 Dysarthria following nontraumatic intracerebral hemorrhage: Secondary | ICD-10-CM | POA: Diagnosis not present

## 2019-12-22 DIAGNOSIS — E785 Hyperlipidemia, unspecified: Secondary | ICD-10-CM | POA: Diagnosis not present

## 2019-12-22 DIAGNOSIS — I509 Heart failure, unspecified: Secondary | ICD-10-CM | POA: Diagnosis not present

## 2019-12-22 DIAGNOSIS — I69191 Dysphagia following nontraumatic intracerebral hemorrhage: Secondary | ICD-10-CM | POA: Diagnosis not present

## 2019-12-22 DIAGNOSIS — R197 Diarrhea, unspecified: Secondary | ICD-10-CM | POA: Diagnosis not present

## 2019-12-22 DIAGNOSIS — I480 Paroxysmal atrial fibrillation: Secondary | ICD-10-CM | POA: Diagnosis not present

## 2019-12-22 DIAGNOSIS — I2510 Atherosclerotic heart disease of native coronary artery without angina pectoris: Secondary | ICD-10-CM | POA: Diagnosis not present

## 2019-12-24 ENCOUNTER — Telehealth: Payer: Self-pay

## 2019-12-24 DIAGNOSIS — I252 Old myocardial infarction: Secondary | ICD-10-CM | POA: Diagnosis not present

## 2019-12-24 DIAGNOSIS — I110 Hypertensive heart disease with heart failure: Secondary | ICD-10-CM | POA: Diagnosis not present

## 2019-12-24 DIAGNOSIS — I509 Heart failure, unspecified: Secondary | ICD-10-CM | POA: Diagnosis not present

## 2019-12-24 DIAGNOSIS — I69191 Dysphagia following nontraumatic intracerebral hemorrhage: Secondary | ICD-10-CM | POA: Diagnosis not present

## 2019-12-24 DIAGNOSIS — K219 Gastro-esophageal reflux disease without esophagitis: Secondary | ICD-10-CM | POA: Diagnosis not present

## 2019-12-24 DIAGNOSIS — I69154 Hemiplegia and hemiparesis following nontraumatic intracerebral hemorrhage affecting left non-dominant side: Secondary | ICD-10-CM | POA: Diagnosis not present

## 2019-12-24 DIAGNOSIS — I69192 Facial weakness following nontraumatic intracerebral hemorrhage: Secondary | ICD-10-CM | POA: Diagnosis not present

## 2019-12-24 DIAGNOSIS — I480 Paroxysmal atrial fibrillation: Secondary | ICD-10-CM | POA: Diagnosis not present

## 2019-12-24 DIAGNOSIS — E785 Hyperlipidemia, unspecified: Secondary | ICD-10-CM | POA: Diagnosis not present

## 2019-12-24 DIAGNOSIS — N319 Neuromuscular dysfunction of bladder, unspecified: Secondary | ICD-10-CM | POA: Diagnosis not present

## 2019-12-24 DIAGNOSIS — I2510 Atherosclerotic heart disease of native coronary artery without angina pectoris: Secondary | ICD-10-CM | POA: Diagnosis not present

## 2019-12-24 DIAGNOSIS — R197 Diarrhea, unspecified: Secondary | ICD-10-CM | POA: Diagnosis not present

## 2019-12-24 DIAGNOSIS — I69122 Dysarthria following nontraumatic intracerebral hemorrhage: Secondary | ICD-10-CM | POA: Diagnosis not present

## 2019-12-24 DIAGNOSIS — Z86718 Personal history of other venous thrombosis and embolism: Secondary | ICD-10-CM | POA: Diagnosis not present

## 2019-12-24 DIAGNOSIS — R1310 Dysphagia, unspecified: Secondary | ICD-10-CM | POA: Diagnosis not present

## 2019-12-24 NOTE — Telephone Encounter (Signed)
[name] Cruz PT with [facility] saw pt today. He seems under the weather. Low grade fever 99.5 today. Earlier in the week, sister in the home that cares for him rolled him to his side and he started to cry. He could not verbalize why he was crying. Sister was concerned that maybe he had a heart attack earlier in the week but did not seek medical care. He is also having some trouble swallowing his pills.   Elanie checked his vitals today and his heart sounds irregularly regular. She is wondering if he aspirated something.   They have not reached out to the sister with POA yet. Sister in the home is supposed to get with sister POA to decide if he is to go get care at ER.  Any orders need to be called to Nurse Manager Darlene at [phone].

## 2019-12-26 ENCOUNTER — Other Ambulatory Visit: Payer: Self-pay | Admitting: Family Medicine

## 2019-12-27 DIAGNOSIS — Z86718 Personal history of other venous thrombosis and embolism: Secondary | ICD-10-CM | POA: Diagnosis not present

## 2019-12-27 DIAGNOSIS — I69192 Facial weakness following nontraumatic intracerebral hemorrhage: Secondary | ICD-10-CM | POA: Diagnosis not present

## 2019-12-27 DIAGNOSIS — K219 Gastro-esophageal reflux disease without esophagitis: Secondary | ICD-10-CM | POA: Diagnosis not present

## 2019-12-27 DIAGNOSIS — R197 Diarrhea, unspecified: Secondary | ICD-10-CM | POA: Diagnosis not present

## 2019-12-27 DIAGNOSIS — I69191 Dysphagia following nontraumatic intracerebral hemorrhage: Secondary | ICD-10-CM | POA: Diagnosis not present

## 2019-12-27 DIAGNOSIS — I110 Hypertensive heart disease with heart failure: Secondary | ICD-10-CM | POA: Diagnosis not present

## 2019-12-27 DIAGNOSIS — R1310 Dysphagia, unspecified: Secondary | ICD-10-CM | POA: Diagnosis not present

## 2019-12-27 DIAGNOSIS — I69122 Dysarthria following nontraumatic intracerebral hemorrhage: Secondary | ICD-10-CM | POA: Diagnosis not present

## 2019-12-27 DIAGNOSIS — E785 Hyperlipidemia, unspecified: Secondary | ICD-10-CM | POA: Diagnosis not present

## 2019-12-27 DIAGNOSIS — I252 Old myocardial infarction: Secondary | ICD-10-CM | POA: Diagnosis not present

## 2019-12-27 DIAGNOSIS — I69154 Hemiplegia and hemiparesis following nontraumatic intracerebral hemorrhage affecting left non-dominant side: Secondary | ICD-10-CM | POA: Diagnosis not present

## 2019-12-27 DIAGNOSIS — I509 Heart failure, unspecified: Secondary | ICD-10-CM | POA: Diagnosis not present

## 2019-12-27 DIAGNOSIS — N319 Neuromuscular dysfunction of bladder, unspecified: Secondary | ICD-10-CM | POA: Diagnosis not present

## 2019-12-27 DIAGNOSIS — I2510 Atherosclerotic heart disease of native coronary artery without angina pectoris: Secondary | ICD-10-CM | POA: Diagnosis not present

## 2019-12-27 DIAGNOSIS — I480 Paroxysmal atrial fibrillation: Secondary | ICD-10-CM | POA: Diagnosis not present

## 2019-12-30 DIAGNOSIS — I480 Paroxysmal atrial fibrillation: Secondary | ICD-10-CM | POA: Diagnosis not present

## 2019-12-30 DIAGNOSIS — R1310 Dysphagia, unspecified: Secondary | ICD-10-CM | POA: Diagnosis not present

## 2019-12-30 DIAGNOSIS — I509 Heart failure, unspecified: Secondary | ICD-10-CM | POA: Diagnosis not present

## 2019-12-30 DIAGNOSIS — R197 Diarrhea, unspecified: Secondary | ICD-10-CM | POA: Diagnosis not present

## 2019-12-30 DIAGNOSIS — I69154 Hemiplegia and hemiparesis following nontraumatic intracerebral hemorrhage affecting left non-dominant side: Secondary | ICD-10-CM | POA: Diagnosis not present

## 2019-12-30 DIAGNOSIS — K219 Gastro-esophageal reflux disease without esophagitis: Secondary | ICD-10-CM | POA: Diagnosis not present

## 2019-12-30 DIAGNOSIS — I252 Old myocardial infarction: Secondary | ICD-10-CM | POA: Diagnosis not present

## 2019-12-30 DIAGNOSIS — I69191 Dysphagia following nontraumatic intracerebral hemorrhage: Secondary | ICD-10-CM | POA: Diagnosis not present

## 2019-12-30 DIAGNOSIS — Z86718 Personal history of other venous thrombosis and embolism: Secondary | ICD-10-CM | POA: Diagnosis not present

## 2019-12-30 DIAGNOSIS — I69122 Dysarthria following nontraumatic intracerebral hemorrhage: Secondary | ICD-10-CM | POA: Diagnosis not present

## 2019-12-30 DIAGNOSIS — I69192 Facial weakness following nontraumatic intracerebral hemorrhage: Secondary | ICD-10-CM | POA: Diagnosis not present

## 2019-12-30 DIAGNOSIS — N319 Neuromuscular dysfunction of bladder, unspecified: Secondary | ICD-10-CM | POA: Diagnosis not present

## 2019-12-30 DIAGNOSIS — E785 Hyperlipidemia, unspecified: Secondary | ICD-10-CM | POA: Diagnosis not present

## 2019-12-30 DIAGNOSIS — I2510 Atherosclerotic heart disease of native coronary artery without angina pectoris: Secondary | ICD-10-CM | POA: Diagnosis not present

## 2019-12-30 DIAGNOSIS — I110 Hypertensive heart disease with heart failure: Secondary | ICD-10-CM | POA: Diagnosis not present

## 2020-01-03 DIAGNOSIS — I69354 Hemiplegia and hemiparesis following cerebral infarction affecting left non-dominant side: Secondary | ICD-10-CM | POA: Diagnosis not present

## 2020-01-03 DIAGNOSIS — I610 Nontraumatic intracerebral hemorrhage in hemisphere, subcortical: Secondary | ICD-10-CM | POA: Diagnosis not present

## 2020-01-13 ENCOUNTER — Ambulatory Visit: Payer: Medicare Other | Admitting: Gastroenterology

## 2020-01-27 ENCOUNTER — Telehealth: Payer: Self-pay

## 2020-01-27 NOTE — Telephone Encounter (Signed)
Pt's mother called asking if there was a way to get a [facility] aide in the house to get help with bathing and other ADLs. I advised her that insurance does not usually cover this. If they do it is when Skilled nursing is in the home with PT and OT and it is not everyday. She asked if Dr [name] could call and explain it to her or have his CMA call her.

## 2020-01-29 ENCOUNTER — Other Ambulatory Visit: Payer: Self-pay | Admitting: Family Medicine

## 2020-02-01 ENCOUNTER — Telehealth: Payer: Self-pay | Admitting: *Deleted

## 2020-02-01 NOTE — Telephone Encounter (Signed)
Patient's sister Steward Drone left a voicemail stating that they tried to get a refill on his blood pressure medication and it was denied. It appears that Coreg was requested and it is no longer on his medication list. Patient's sister requested a call back.

## 2020-02-02 NOTE — Telephone Encounter (Signed)
Carvedilol was stopped during hospitalization for stroke 07/2019. How are blood pressures running?

## 2020-02-02 NOTE — Telephone Encounter (Signed)
Lvm for pt's sister, Steward Drone [facility]), asking her to call back.  Need to relay Dr. Timoteo Expose message and get answer to his question.

## 2020-02-02 NOTE — Telephone Encounter (Signed)
[facility] was involved 10/2019 but their care has ended.  Is there a change in functional status recently? If so, they may qualify for renewed [facility] services.  If not, they would need to pursue private care aide for further assistance at home. May call [facility] to get some resources for further assistance at home: [phone] Does he remain bedbound?

## 2020-02-02 NOTE — Telephone Encounter (Addendum)
Spoke with Steward Drone relaying Dr. Timoteo Expose message.  States last BP reading was 167/102 on Sun, 01/30/20.  Also, states pt has not had interest in getting out of bed much the last couple of days.

## 2020-02-02 NOTE — Telephone Encounter (Signed)
Spoke with pt's sister, Steward Drone [facility]), notifying her of the message received from Ms. Dot (pt's mom).  She states pt is bed bound, but HH services were terminated in 12/2019 so there's no need to request it again because it will be denied.  FYI to Dr. [name].

## 2020-02-03 DIAGNOSIS — I69354 Hemiplegia and hemiparesis following cerebral infarction affecting left non-dominant side: Secondary | ICD-10-CM | POA: Diagnosis not present

## 2020-02-03 DIAGNOSIS — I610 Nontraumatic intracerebral hemorrhage in hemisphere, subcortical: Secondary | ICD-10-CM | POA: Diagnosis not present

## 2020-02-08 ENCOUNTER — Telehealth: Payer: Self-pay | Admitting: Family Medicine

## 2020-02-08 NOTE — Telephone Encounter (Signed)
Called to r/s appt with [facility] on 03/07/20 and sister Lupita Leash asked to call her back next week to r/s

## 2020-02-11 ENCOUNTER — Other Ambulatory Visit: Payer: Self-pay | Admitting: Neurology

## 2020-02-17 ENCOUNTER — Other Ambulatory Visit: Payer: Self-pay | Admitting: Neurology

## 2020-02-17 ENCOUNTER — Other Ambulatory Visit: Payer: Self-pay | Admitting: Family Medicine

## 2020-02-17 DIAGNOSIS — G40909 Epilepsy, unspecified, not intractable, without status epilepticus: Secondary | ICD-10-CM

## 2020-02-18 ENCOUNTER — Other Ambulatory Visit: Payer: Self-pay | Admitting: Neurology

## 2020-02-18 ENCOUNTER — Telehealth: Payer: Self-pay

## 2020-02-18 NOTE — Telephone Encounter (Signed)
[name] Cruz for pt wants to ck on status of pharmacy request for depakote and Limpat; advised refills not at our office and Steward Drone will ck with pharmacy to see who they sent refill request to. Nothing further needed at this time.

## 2020-02-24 ENCOUNTER — Other Ambulatory Visit: Payer: Self-pay | Admitting: Family Medicine

## 2020-02-25 NOTE — Telephone Encounter (Signed)
[facility] Last filled:  01/14/20, #90 Last OV:  10/22/19, chronic diarrhea Next OV:  03/13/20, AWV prt 2  Looks like carvedilol was stopped 07/26/19.

## 2020-02-28 NOTE — Telephone Encounter (Signed)
Carvedilol was stopped 07/2019.  plz call for update on latest BP and heart rate - if elevated, may need to restart carvedilol.

## 2020-03-01 NOTE — Telephone Encounter (Signed)
Spoke with pt's sister/POA, Steward Drone, asking about recent BP and HR readings.  States she has not checked it in awhile but says its's been normal.  But she will monitor for the next few days and let us know.  FYI to Dr. [name].

## 2020-03-02 ENCOUNTER — Telehealth: Payer: Self-pay | Admitting: Adult Health

## 2020-03-02 MED ORDER — DIVALPROEX SODIUM 500 MG PO DR TAB: 500.0000 mg | DELAYED_RELEASE_TABLET | Freq: Two times a day (BID) | ORAL | 0 refills | Status: DC

## 2020-03-02 NOTE — Telephone Encounter (Signed)
Pt's sister [name] called stating that the pt is needing his divalproex (DEPAKOTE) 500 MG DR tablet(Expired) filled. Pt just had a seizure and he has been out of the medication for several weeks now. Please advise.

## 2020-03-02 NOTE — Telephone Encounter (Signed)
Spoke  with sister of pt.   Made appt for him 03-06-20 at 1045 Monday VV for sz as last appt was in 10-2018.  Needed depakote 500mg  po BID, on keppra and vimpat as well.  Refilled to [facility] [place], [place].

## 2020-03-05 DIAGNOSIS — I610 Nontraumatic intracerebral hemorrhage in hemisphere, subcortical: Secondary | ICD-10-CM | POA: Diagnosis not present

## 2020-03-05 DIAGNOSIS — I69354 Hemiplegia and hemiparesis following cerebral infarction affecting left non-dominant side: Secondary | ICD-10-CM | POA: Diagnosis not present

## 2020-03-06 ENCOUNTER — Encounter: Payer: Self-pay | Admitting: Adult Health

## 2020-03-06 ENCOUNTER — Telehealth (INDEPENDENT_AMBULATORY_CARE_PROVIDER_SITE_OTHER): Payer: Medicare Other | Admitting: Adult Health

## 2020-03-06 DIAGNOSIS — G40909 Epilepsy, unspecified, not intractable, without status epilepticus: Secondary | ICD-10-CM

## 2020-03-06 DIAGNOSIS — Z8673 Personal history of transient ischemic attack (TIA), and cerebral infarction without residual deficits: Secondary | ICD-10-CM | POA: Diagnosis not present

## 2020-03-06 MED ORDER — LACOSAMIDE 200 MG PO TABS
ORAL_TABLET | ORAL | 5 refills | Status: DC
Start: 2020-03-06 — End: 2020-09-04

## 2020-03-06 MED ORDER — DIVALPROEX SODIUM 500 MG PO DR TAB
500.0000 mg | DELAYED_RELEASE_TABLET | Freq: Two times a day (BID) | ORAL | 3 refills | Status: DC
Start: 2020-03-06 — End: 2020-05-31

## 2020-03-06 MED ORDER — ASPIRIN EC 81 MG PO TBEC: 81.0000 mg | DELAYED_RELEASE_TABLET | Freq: Every day | ORAL | 11 refills | Status: AC

## 2020-03-06 MED ORDER — LEVETIRACETAM 750 MG PO TABS: 1500.0000 mg | ORAL_TABLET | Freq: Two times a day (BID) | ORAL | 2 refills | Status: DC

## 2020-03-06 NOTE — Progress Notes (Signed)
[facility] [address] [place]. [place] [address] [phone]       FOLLOW UP NOTE  [name] Cruz Date of Birth:  Apr 05, 1955 Medical Record Number:  [id number]   Reason for visit: seizures and stroke follow up  Virtual Visit via Video Note  I connected with [name] Cruz at 10:45 AM EST by a video enabled telemedicine application located in office and verified that I am speaking with the correct person using two identifiers who was located at their own home accompanied by his sister who provided history.    Oliver Hum, RN scheduled visit who discussed the limitations of evaluation and management by telemedicine and the availability of in person appointments. The patient/family expressed understanding and agreed to proceed.   SUBJECTIVE:   HPI:   [name] Cruz is a 65 y.o. Cruz with PMHx significant for hx of multiple prior strokes (R BG hemorrhage 07/2019, R PCA infarct 07/2018, R cerebellar infarct 06/2018 and L MCA infarct w/ hemorrhagic conversion 2004), atrial fibrillation, hx of DVT, seizure disorder, HTN, HLD, cognitive impairment (?  Vascular dementia), hx of tobacco use and EtOH use and CAD s/p CABG.  Today, 03/06/2020, [name] Cruz is being seen via MyChart video virtual visit for seizure medication refills.  He was previously seen in office on 10/22/2018 for stroke and seizure follow-up but has not followed up since that time.  Since prior visit, he was diagnosed with R BG hemorrhage in 07/2019 but without significant HTN concerning for hemorrhagic conversion given history of AF not on AC with residual left hemiparesis.  Initially discharged to CIR but due to continued significant cognitive and physical deficits, he was discharged to SNF on 08/17/2019.  He is currently residing with his sister with deficits significant for left-sided weakness and cognition and is primarily bedbound.  Hx of multiple strokes: -Residual left sided hemiplegia,  cognitive impairment and aphasia. No swallowing difficulties  -completed therapies as he met rehab potential per sister. He is primarily bed bound - will transfer to w/c for short time during the day but otherwise resides in his bed -Previously on aspirin but held in setting of recent [place] - per review of hospital notes, advised to follow-up with Dr. Leonie Man for further consideration of initiating AC with history of A. fib and recent stroke concerning for hemorrhagic conversion in setting of A. fib not on [facility] although he did not f/u as advised with Dr. Leonie Man and currently not on any antithrombotics or anticoagulants -Reports compliance on atorvastatin 80 mg daily with LDL 78 (06/2019) -Blood pressure monitored at home which has been stable  Seizures: -ED evaluation 06/25/2019 for recurrent tonic-clonic seizure on Vimpat and Keppra with initiation of Depakote -Sister reports recent typical seizure event 2/17 lasting for 3 min likely in setting of being out of Depakote medication. He was evaluated by EMS and fully recovered by the time they arrived herefore they did not proceed to ED. No additional events since that time and has since restarted on Depakote -Currently prescribed Depakote 500 mg DR, lacosamide 200 mg twice daily and Keppra 1500 mg twice daily and prior to running out, no seizures and tolerating AED's without side effects       ROS:   N/A  PMH:  Past Medical History:  Diagnosis Date  . Acute deep vein thrombosis (DVT) of left upper extremity (HCC)    L brachial and basilic veins, eliquis started 08/22/2017 to continue for 3 months total   .  Cognitive impairment 2007   after stroke, saw rehab but told to stop because was too upsetting to him  . History of chicken pox   . HLD (hyperlipidemia)   . HTN (hypertension)   . NSTEMI (non-ST elevated myocardial infarction) ([place]) 02/21/2017  . Obesity   . Stroke, hemorrhagic ([place]) 2007   thought 2/2 HTN (240sbp); residual cognitive impairment,  loss of R peripheral field, no driving    PSH:  Past Surgical History:  Procedure Laterality Date  . ANKLE SURGERY  1990s   right foot with plate and screws  . CORONARY ARTERY BYPASS GRAFT N/A 02/24/2017   3v Procedure: CORONARY ARTERY BYPASS GRAFTING (CABG) x 3 ON PUMP USING LEFT INTERNAL MAMMARY ARTERY TO LEFT ANTERIOR DESENDING CORNARY ARTERY, RIGHT GREATER SAPHENOUS VEIN TO LEFT CIRCUMFLEX ARTERY AND POSTERIOR DESENDING ARTERY. RIGHT GREATER SAPHENOUS VEIN OBTAINED VIA ENDOVEIN HARVEST.;  Surgeon: Grace Isaac, MD  . IABP INSERTION N/A 02/21/2017   Procedure: IABP Insertion;  Surgeon: Nelva Bush, MD;  Location: [facility];  Service: Cardiovascular;  Laterality: N/A;  . LEFT HEART CATH AND CORONARY ANGIOGRAPHY N/A 02/21/2017   Procedure: LEFT HEART CATH AND CORONARY ANGIOGRAPHY;  Surgeon: Nelva Bush, MD;  Location: [facility];  Service: Cardiovascular;  Laterality: N/A;  . TEE WITHOUT CARDIOVERSION N/A 02/24/2017   Procedure: TRANSESOPHAGEAL ECHOCARDIOGRAM (TEE);  Surgeon: Grace Isaac, MD;  Location: [facility];  Service: Open Heart Surgery;  Laterality: N/A;    Social History:  Social History   Socioeconomic History  . Marital status: Single    Spouse name: Not on file  . Number of children: 2  . Years of education: Not on file  . Highest education level: 10th grade  Occupational History    Comment: Unemployed   Tobacco Use  . Smoking status: Never Smoker  . Smokeless tobacco: Former Network engineer  . Vaping Use: Never used  Substance and Sexual Activity  . Alcohol use: Not Currently  . Drug use: No  . Sexual activity: Not Currently  Other Topics Concern  . Not on file  Social History Narrative   Caffeine: 1 pot coffee   Lives with oldest sister in [place]   Occupation: disabled, was Nature conservation officer   Does not drive.   Activity: no regular exercise   Diet: good water, drinks V8 juice   Right handed    Social Determinants of  Health   Financial Resource Strain: Not on file  Food Insecurity: Not on file  Transportation Needs: Not on file  Physical Activity: Not on file  Stress: Not on file  Social Connections: Not on file  Intimate Partner Violence: Not on file    Family History:  Family History  Problem Relation Age of Onset  . Alzheimer's disease Maternal Grandfather   . Cancer Mother        lymphoma  . Alcohol abuse Father        smoker  . Coronary artery disease Neg Hx   . Stroke Neg Hx   . Diabetes Neg Hx     Medications:   Current Outpatient Medications on File Prior to Visit  Medication Sig Dispense Refill  . atorvastatin (LIPITOR) 80 MG tablet TAKE 1 TABLET BY MOUTH EVERY DAY 90 tablet 0  . Cyanocobalamin (VITAMIN B-12) 1000 MCG SUBL Place 1,000 mcg under the tongue daily.    . diphenoxylate-atropine (LOMOTIL) 2.5-0.025 MG tablet Take 1 tablet by mouth 2 (two) times daily as needed for diarrhea  or loose stools. 30 tablet 0  . divalproex (DEPAKOTE) 500 MG DR tablet Take 1 tablet (500 mg total) by mouth 2 (two) times daily. 60 tablet 0  . lacosamide (VIMPAT) 200 MG TABS tablet 1 tablet at 8 am and 1 tablet at 10 pm. 180 tablet 1  . levETIRAcetam (KEPPRA) 750 MG tablet Take 2 tablets (1,500 mg total) by mouth 2 (two) times daily. 120 tablet 2  . Multiple Vitamin (MULTIVITAMIN WITH MINERALS) TABS tablet Take 1 tablet by mouth daily. 90 tablet 0  . pantoprazole (PROTONIX) 40 MG tablet Take 1 tablet (40 mg total) by mouth at bedtime.    . protective barrier (RESTORE) CREA Apply 1 application topically 2 (two) times daily. And prn after pericare to sacrum/buttocks.  0  . sertraline (ZOLOFT) 25 MG tablet TAKE 1 TABLET BY MOUTH EVERY DAY 90 tablet 0  . ziprasidone (GEODON) 40 MG capsule TAKE 1 CAPSULE (40 MG TOTAL) BY MOUTH AT BEDTIME. 90 capsule 1   No current facility-administered medications on file prior to visit.    Allergies:   Allergies  Allergen Reactions  . Losartan Other (See Comments)     hyperkalemia      OBJECTIVE:  Physical Exam N/A d/t visit type    ASSESSMENT/PLAN: [name] Cruz with PMHx significant for history of multiple hemorrhagic and ischemic strokes, poststroke seizures, atrial fibrillation not on AC, HTN, HLD, CAD s/p CABG     1. Seizures, post stroke: Initial onset 07/2017.  Recent seizure 2/17 in setting of running out of Depakote with prior unprovoked seizure 06/2019 on Vimpat and Keppra therefore Depakote initiated in ED. Refills provided for Vimpat, Keppra and Depakote for seizure prophylaxis 2. Hx of multiple strokes:  a. R BG hemorrhagic stroke 07/2019: ?  Hemorrhagic conversion given history of A. Fib not on AC b. R PCA stroke 07/2018 2/2 small vessel disease c. Right cerebellum stroke 06/2018 2/2 small vessel disease d. L MCA stroke with hemorrhagic conversion 2004 e. Residual deficit: Left hemiplegia, speech and language impairment, cognitive impairment and right homonymous hemianopia.  f. Restart aspirin 81 mg daily (see below) and atorvastatin for secondary stroke prevention.   g. Discussed secondary stroke prevention measures and importance of close PCP follow up for aggressive stroke risk factor management including HTN with BP goal<130/90, and HLD with LDL goal<70 3. Atrial fibrillation: CHA2DS2-VASc score of at least 5.  Not previously on [facility] d/t hx of hemorrhagic conversion and multiple microhemorrhages as well as prior strokes small vessel disease etiology and not in setting of A. fib.  07/2019 R BG ICH with concern of hemorrhagic conversion in setting of A. Fib -discussed further with Dr. Leonie Man and due to patient's current functional status and difficulty fully determining if recent ICH in setting of A. fib not on [facility], it would be recommended to restart aspirin 81 mg daily at this time as risk of starting [facility] may outweigh benefit.  No indication for repeat imaging prior to initiating as he has been stable without  worsening or new stroke/TIA symptoms over the past 7 months.  Also recommend scheduling follow-up visit with cardiology as he has not had OV in almost 2 years     Follow up in 6 months or call earlier if needed  CC:  [name] Cruz provider: Dr. Bobbie Stack, Garlon Hatchet, MD     I spent 40 minutes of face-to-face and non-face-to-face time with patient and sister via video visit.  This included  previsit chart review including hospitalization pertinent progress notes, lab work and imaging, lab review, study review, order entry, electronic health record documentation, patient education regarding multiple prior strokes, residual deficits, importance of managing stroke risk factors and answered all other questions to patient and sisters satisfaction   [name] Cruz, AGNP-BC  [facility] [address] [place], [place] [address]  Phone [phone] Fax [phone] Note: This document was prepared with digital dictation and possible smart phrase technology. Any transcriptional errors that result from this process are unintentional.

## 2020-03-07 ENCOUNTER — Ambulatory Visit: Payer: Medicare Other

## 2020-03-07 ENCOUNTER — Other Ambulatory Visit: Payer: Self-pay

## 2020-03-07 NOTE — Progress Notes (Signed)
I agree with the above plan 

## 2020-03-13 ENCOUNTER — Other Ambulatory Visit: Payer: Self-pay

## 2020-03-13 ENCOUNTER — Telehealth (INDEPENDENT_AMBULATORY_CARE_PROVIDER_SITE_OTHER): Payer: Medicare Other | Admitting: Family Medicine

## 2020-03-13 ENCOUNTER — Encounter: Payer: Self-pay | Admitting: Family Medicine

## 2020-03-13 VITALS — BP 112/64 | HR 64 | Temp 97.4°F | Ht 63.0 in

## 2020-03-13 DIAGNOSIS — I480 Paroxysmal atrial fibrillation: Secondary | ICD-10-CM | POA: Diagnosis not present

## 2020-03-13 DIAGNOSIS — I10 Essential (primary) hypertension: Secondary | ICD-10-CM | POA: Diagnosis not present

## 2020-03-13 DIAGNOSIS — G40909 Epilepsy, unspecified, not intractable, without status epilepticus: Secondary | ICD-10-CM

## 2020-03-13 DIAGNOSIS — I6930 Unspecified sequelae of cerebral infarction: Secondary | ICD-10-CM | POA: Diagnosis not present

## 2020-03-13 DIAGNOSIS — Z951 Presence of aortocoronary bypass graft: Secondary | ICD-10-CM | POA: Diagnosis not present

## 2020-03-13 DIAGNOSIS — I25810 Atherosclerosis of coronary artery bypass graft(s) without angina pectoris: Secondary | ICD-10-CM

## 2020-03-13 DIAGNOSIS — E782 Mixed hyperlipidemia: Secondary | ICD-10-CM | POA: Diagnosis not present

## 2020-03-13 DIAGNOSIS — R32 Unspecified urinary incontinence: Secondary | ICD-10-CM

## 2020-03-13 DIAGNOSIS — R4189 Other symptoms and signs involving cognitive functions and awareness: Secondary | ICD-10-CM | POA: Diagnosis not present

## 2020-03-13 DIAGNOSIS — I69354 Hemiplegia and hemiparesis following cerebral infarction affecting left non-dominant side: Secondary | ICD-10-CM

## 2020-03-13 DIAGNOSIS — R451 Restlessness and agitation: Secondary | ICD-10-CM | POA: Diagnosis not present

## 2020-03-13 DIAGNOSIS — R059 Cough, unspecified: Secondary | ICD-10-CM

## 2020-03-13 DIAGNOSIS — G9389 Other specified disorders of brain: Secondary | ICD-10-CM | POA: Diagnosis not present

## 2020-03-13 DIAGNOSIS — R159 Full incontinence of feces: Secondary | ICD-10-CM

## 2020-03-13 MED ORDER — ZIPRASIDONE HCL 20 MG PO CAPS: 20.0000 mg | ORAL_CAPSULE | Freq: Every day | ORAL | 1 refills | Status: DC

## 2020-03-13 NOTE — Progress Notes (Signed)
Patient ID: [name] Cruz, male    DOB: 12-16-1955, 65 y.o.   MRN: [id number]  Virtual visit completed through MyChart, a video enabled telemedicine application. Due to national recommendations of social distancing due to COVID-19, a virtual visit is felt to be most appropriate for this patient at this time. Reviewed limitations, risks, security and privacy concerns of performing a virtual visit and the availability of in person appointments. I also reviewed that there may be a patient responsible charge related to this service. The patient agreed to proceed.   Patient location: home Provider location: [place] at [facility], office Persons participating in this virtual visit: patient, provider, sister Lindwood Qua  If any vitals were documented, they were collected by patient at home unless specified below.    BP 112/64   Pulse 64   Temp (!) 97.4 F (36.3 C)   Ht 5\' 3"  (1.6 m)   BMI 30.58 kg/m    CC: medicare wellness - converted to follow up visit Subjective:   HPI: [name] Cruz is a 65 y.o. male presenting on 03/13/2020 for Medicare Wellness and Cough (Per 03/15/2020, pt has a dry cough.  Started a few mos ago. Tried Robitussin, helpful. )   CareTeam: Neurology - Dr Steward Drone  Cardiology - Dr Pearlean Brownie (last seen 11/2017) Cardiothoracic surgery - Dr 12/2017 (last seen 03/2017)  Saw neurology last week, note reviewed. Recent seizure in setting of running out of depakote. Planned continue depakote, lacosamide, keppra. rec continue aspirin, no further blood thinner. rec schedule cardiology appointment.   Has not seen health advisor this year. Sister 04/2017 is HCPOA (she works at [name]). He lived alone, but in the past year oldest sister [place] has moved in to provide care, younger sister Sedalia Muta also involved.  Mother Dot Lupita Leash has also moved in with them.  Nonambulatory. Stays in bed most of the day until sisters help him out of bed. Stays in bed due to  persistent residual L hemiparesis. They also note some difficulty with R "good" leg. L leg and arm hemiparesis. Last stroke was 07/18/2019. Hospitalized through 8/3 at [facility], discharged to [facility] through 8/20 (3 wks total). He did receive HHPT through 12/2019. They have looked into personal care services through private company but that was too expensive. He does feed himself.   They have hoyer lift - have ordered replacement part to fix it.   Agree to try lower dose geodon 20mg  at night time. Sleeping well.   Urinary incontinence - using depends regularly unable to tell when he needs to go, goes through 10/day.  Bowel incontinence - uses depends for this as well. Soft formed stools about 1-2/day. Denies pain.   Cough present for several months. Raspy cough, overall dry. Treating with robitussin DM with benefit. Sister denies choking or swallowing concerns.   No fevers/chills, chest pain, dyspnea, wheezing, dizziness, nausea/vomiting. No sick contacts.    Hearing Screening   125Hz  250Hz  500Hz  1000Hz  2000Hz  3000Hz  4000Hz  6000Hz  8000Hz   Right ear:           Left ear:           Comments: Last hearing screening 01/2017 at our office.   Vision Screening Comments: Last eye exam, 2+ yrs ago.   Flowsheet Row Clinical Support from 06/17/2018 in [facility] [place]  PHQ-2 Total Score 0      Fall Risk  03/13/2020 06/17/2018 01/15/2018 01/28/2017 10/20/2015  Falls in the past  year? - 0 0 No No  Number falls in past yr: 0 - - - -  Comment Fell getting out of shower.  Steward Drone able to break fall and help pt to the floor. - - - -  Injury with Fall? 0 - - - -  1 near fall without injury - sister caught him in bathroom and eased onto floor, EMS called to get hip up.     Relevant past medical, surgical, family and social history reviewed and updated as indicated. Interim medical history since our last visit reviewed. Allergies and medications reviewed and updated. Outpatient  Medications Prior to Visit  Medication Sig Dispense Refill  . aspirin EC 81 MG tablet Take 1 tablet (81 mg total) by mouth daily. Swallow whole. 30 tablet 11  . atorvastatin (LIPITOR) 80 MG tablet TAKE 1 TABLET BY MOUTH EVERY DAY 90 tablet 0  . Cyanocobalamin (VITAMIN B-12) 1000 MCG SUBL Place 1,000 mcg under the tongue daily.    . divalproex (DEPAKOTE) 500 MG DR tablet Take 1 tablet (500 mg total) by mouth 2 (two) times daily. 180 tablet 3  . lacosamide (VIMPAT) 200 MG TABS tablet 1 tablet at 8 am and 1 tablet at 10 pm. 60 tablet 5  . levETIRAcetam (KEPPRA) 750 MG tablet Take 2 tablets (1,500 mg total) by mouth 2 (two) times daily. 120 tablet 2  . Multiple Vitamin (MULTIVITAMIN WITH MINERALS) TABS tablet Take 1 tablet by mouth daily. 90 tablet 0  . sertraline (ZOLOFT) 25 MG tablet TAKE 1 TABLET BY MOUTH EVERY DAY 90 tablet 0  . ziprasidone (GEODON) 40 MG capsule TAKE 1 CAPSULE (40 MG TOTAL) BY MOUTH AT BEDTIME. 90 capsule 1  . diphenoxylate-atropine (LOMOTIL) 2.5-0.025 MG tablet Take 1 tablet by mouth 2 (two) times daily as needed for diarrhea or loose stools. 30 tablet 0  . pantoprazole (PROTONIX) 40 MG tablet Take 1 tablet (40 mg total) by mouth at bedtime. (Patient not taking: Reported on 03/13/2020)    . protective barrier (RESTORE) CREA Apply 1 application topically 2 (two) times daily. And prn after pericare to sacrum/buttocks.  0   No facility-administered medications prior to visit.     Per HPI unless specifically indicated in ROS section below Review of Systems Objective:  BP 112/64   Pulse 64   Temp (!) 97.4 F (36.3 C)   Ht 5\' 3"  (1.6 m)   BMI 30.58 kg/m   Wt Readings from Last 3 Encounters:  10/09/19 172 lb 9.9 oz (78.3 kg)  07/26/19 172 lb 9.9 oz (78.3 kg)  07/19/19 186 lb 4.6 oz (84.5 kg)   unable to check weight.     Physical exam: Gen: alert, NAD, not ill appearing Pulm: speaks in short phrases without increased work of breathing Psych: normal mood, normal  thought content      Results for orders placed or performed during the hospital encounter of 10/28/19  Basic metabolic panel  Result Value Ref Range   Sodium 143 135 - 145 mmol/L   Potassium 3.7 3.5 - 5.1 mmol/L   Chloride 106 98 - 111 mmol/L   CO2 27 22 - 32 mmol/L   Glucose, Bld 93 70 - 99 mg/dL   BUN 12 8 - 23 mg/dL   Creatinine, Ser 10/30/19 (L) 0.61 - 1.24 mg/dL   Calcium 8.0 (L) 8.9 - 10.3 mg/dL   GFR, Estimated 1.61 >09 mL/min   Anion gap 10 5 - 15  CBC with Differential  Result Value Ref Range  WBC 6.0 4.0 - 10.5 K/uL   RBC 3.67 (L) 4.22 - 5.81 MIL/uL   Hemoglobin 11.2 (L) 13.0 - 17.0 g/dL   HCT 69.4 (L) 85.4 - 62.7 %   MCV 95.1 80.0 - 100.0 fL   MCH 30.5 26.0 - 34.0 pg   MCHC 32.1 30.0 - 36.0 g/dL   RDW 03.5 00.9 - 38.1 %   Platelets PLATELET CLUMPS NOTED ON SMEAR, UNABLE TO ESTIMATE 150 - 400 K/uL   nRBC 0.0 0.0 - 0.2 %   Neutrophils Relative % 36 %   Neutro Abs 2.2 1.7 - 7.7 K/uL   Lymphocytes Relative 49 %   Lymphs Abs 2.9 0.7 - 4.0 K/uL   Monocytes Relative 13 %   Monocytes Absolute 0.8 0.1 - 1.0 K/uL   Eosinophils Relative 1 %   Eosinophils Absolute 0.1 0.0 - 0.5 K/uL   Basophils Relative 1 %   Basophils Absolute 0.0 0.0 - 0.1 K/uL   Immature Granulocytes 0 %   Abs Immature Granulocytes 0.01 0.00 - 0.07 K/uL  Magnesium  Result Value Ref Range   Magnesium 1.7 1.7 - 2.4 mg/dL  Phosphorus  Result Value Ref Range   Phosphorus 3.5 2.5 - 4.6 mg/dL  Valproic acid level  Result Value Ref Range   Valproic Acid Lvl 59 50.0 - 100.0 ug/mL  Sedimentation rate  Result Value Ref Range   Sed Rate 5 0 - 16 mm/hr  TSH  Result Value Ref Range   TSH 1.179 0.350 - 4.500 uIU/mL  Iron  Result Value Ref Range   Iron 52 45 - 182 ug/dL  Vitamin W29  Result Value Ref Range   Vitamin B-12 1,597 (H) 180 - 914 pg/mL  C-reactive protein  Result Value Ref Range   CRP 1.9 (H) <1.0 mg/dL  Sedimentation rate  Result Value Ref Range   Sed Rate 7 0 - 16 mm/hr   Assessment &  Plan:   Problem List Items Addressed This Visit    Seizure disorder (HCC)    Recurrent seizures when he's run out of medications. currently has refills of all needed meds. Continue 3 drug regimen of vimpat, depakote, keppra. Appreciate neurology care.       S/P CABG x 3    Only on aspirin 81mg  daily.  Advised call cardiology for f/u as overdue.       HTN (hypertension)    Stable period off antihypertensives - continue.       HLD (hyperlipidemia)    Continue high dose atorvastatin. The ASCVD Risk score DC Jr., et al., 2013) failed to calculate for the following reasons:   The patient has a prior MI or stroke diagnosis       History of stroke with current residual effects - Primary    H/o both ischemic and hemorrhagic CVA in the past.  Increased residual deficit (L hemiparesis) since latest stroke 07/2019. Now largely homebound, needs full assistance to get out of bed. Personal care unaffordable. Mother and sisters supportive. Completed latest HHPT 12/2019. Has hoyer lift at home.       Hemiparesis affecting left side as late effect of stroke (HCC)   Encephalomalacia   Cough    Mild intermittent dry cough present for months. No signs of bacterial infection at this time. Ok to continue robitussin DM. They deny concerns for aspiration.       Coronary artery disease involving coronary bypass graft of native heart without angina pectoris    Continue aspirin, statin.  Cognitive impairment    Due to encephalomalacia after remote hemorrhagic stroke, worsened by recurrent hemorrhagic/ischemic strokes      Bowel and bladder incontinence    Discussed with family - regularly uses depends briefs for ongoing bowel and bladder incontinence. Does not express urge to go. Can go through 10 diapers/day.  Does have soft formed stools about 1-2/day.       Atrial fibrillation (HCC)    Only on aspirin.  No anticoagulation give ongoing fall risk and history of hemorrhagic strokes.        Agitation    Started on geodon 40mg  nightly - stable period. Will try tapering dose to 20mg  nightly. Update with effect.  Does not exhibit signs of depression.           Meds ordered this encounter  Medications  . ziprasidone (GEODON) 20 MG capsule    Sig: Take 1 capsule (20 mg total) by mouth at bedtime.    Dispense:  90 capsule    Refill:  1   No orders of the defined types were placed in this encounter.   I discussed the assessment and treatment plan with the patient. The patient was provided an opportunity to ask questions and all were answered. The patient agreed with the plan and demonstrated an understanding of the instructions. The patient was advised to call back or seek an in-person evaluation if the symptoms worsen or if the condition fails to improve as anticipated.  Follow up plan: No follow-ups on file.  Eustaquio BoydenJavier Zayin Valadez, MD

## 2020-03-16 ENCOUNTER — Encounter: Payer: Self-pay | Admitting: Family Medicine

## 2020-03-16 DIAGNOSIS — R059 Cough, unspecified: Secondary | ICD-10-CM | POA: Insufficient documentation

## 2020-03-16 NOTE — Assessment & Plan Note (Addendum)
Recurrent seizures when he's run out of medications. currently has refills of all needed meds. Continue 3 drug regimen of vimpat, depakote, keppra. Appreciate neurology care.

## 2020-03-16 NOTE — Assessment & Plan Note (Addendum)
Started on geodon 40mg  nightly - stable period. Will try tapering dose to 20mg  nightly. Update with effect.  Does not exhibit signs of depression.

## 2020-03-16 NOTE — Assessment & Plan Note (Signed)
Due to encephalomalacia after remote hemorrhagic stroke, worsened by recurrent hemorrhagic/ischemic strokes

## 2020-03-16 NOTE — Assessment & Plan Note (Signed)
Discussed with family - regularly uses depends briefs for ongoing bowel and bladder incontinence. Does not express urge to go. Can go through 10 diapers/day.  Does have soft formed stools about 1-2/day.

## 2020-03-16 NOTE — Assessment & Plan Note (Signed)
Continue high dose atorvastatin. The ASCVD Risk score (Goff DC Jr., et al., 2013) failed to calculate for the following reasons:   The patient has a prior MI or stroke diagnosis  

## 2020-03-16 NOTE — Assessment & Plan Note (Signed)
Continue aspirin, statin.  

## 2020-03-16 NOTE — Assessment & Plan Note (Addendum)
Stable period off antihypertensives - continue.

## 2020-03-16 NOTE — Assessment & Plan Note (Signed)
Only on aspirin.  No anticoagulation give ongoing fall risk and history of hemorrhagic strokes.

## 2020-03-16 NOTE — Assessment & Plan Note (Addendum)
Only on aspirin 81mg  daily.  Advised call cardiology for f/u as overdue.

## 2020-03-16 NOTE — Assessment & Plan Note (Addendum)
H/o both ischemic and hemorrhagic CVA in the past.  Increased residual deficit (L hemiparesis) since latest stroke 07/2019. Now largely homebound, needs full assistance to get out of bed. Personal care unaffordable. Mother and sisters supportive. Completed latest HHPT 12/2019. Has hoyer lift at home.

## 2020-03-16 NOTE — Assessment & Plan Note (Signed)
Mild intermittent dry cough present for months. No signs of bacterial infection at this time. Ok to continue robitussin DM. They deny concerns for aspiration.

## 2020-03-23 ENCOUNTER — Telehealth: Payer: Self-pay

## 2020-03-23 NOTE — Chronic Care Management (AMB) (Addendum)
Chronic Care Management Pharmacy Assistant   Name: [name] Cruz  MRN: [id number] DOB: 1955-05-24  Reason for Encounter: Disease State- General   Conditions to be addressed/monitored: Atrial Fibrillation, HTN and HLD  Recent office visits:  03/13/20- Dr. Sharen Cruz- PCP- decreased Geodon from 40 mg to 20 mg daily.  10/22/19- Dr. Sharen Cruz- PCP  Recent consult visits:  03/06/20- [name] Austin, NP- Neurology- Restarted Aspirin 81 mg for stroke prevention  Hospital visits:  Medication Reconciliation was completed by comparing discharge summary, patient's EMR and Pharmacy list, and upon discussion with patient.  Admitted to the hospital on 10/27/20 due to chronic diarrhea. Discharge date was 10/28/19. Discharged from [facility].    New?Medications Started at [facility] Discharge:?? -No new medications  Medication Changes at Hospital Discharge: -Increased Vimpat from 200 mg daily to 200 mg twice daily  Medications Discontinued at Hospital Discharge: -No changes to current medications  Medications that remain the same after Hospital Discharge:??  -All other medications will remain the same.     Admitted to the hospital on 10/09/19 due to diarrhea. Discharge date was 10/09/19. Discharged from [facility].    New?Medications Started at [facility] Discharge:?? -Pepto Bismol - 30 mLs every 6 hours PRN -Potassium Chloride 20 MEQ- 20 Meq daily  Medication Changes at Hospital Discharge: -no changes to current medications  Medications Discontinued at Hospital Discharge: -no changes to current medications  Medications that remain the same after Hospital Discharge:??  -All other medications will remain the same.      Medications: Outpatient Encounter Medications as of 03/23/2020  Medication Sig   aspirin EC 81 MG tablet Take 1 tablet (81 mg total) by mouth daily. Swallow whole.   atorvastatin (LIPITOR) 80 MG tablet TAKE 1 TABLET BY MOUTH EVERY DAY   Cyanocobalamin  (VITAMIN B-12) 1000 MCG SUBL Place 1,000 mcg under the tongue daily.   divalproex (DEPAKOTE) 500 MG DR tablet Take 1 tablet (500 mg total) by mouth 2 (two) times daily.   lacosamide (VIMPAT) 200 MG TABS tablet 1 tablet at 8 am and 1 tablet at 10 pm.   levETIRAcetam (KEPPRA) 750 MG tablet Take 2 tablets (1,500 mg total) by mouth 2 (two) times daily.   Multiple Vitamin (MULTIVITAMIN WITH MINERALS) TABS tablet Take 1 tablet by mouth daily.   sertraline (ZOLOFT) 25 MG tablet TAKE 1 TABLET BY MOUTH EVERY DAY   ziprasidone (GEODON) 20 MG capsule Take 1 capsule (20 mg total) by mouth at bedtime.   No facility-administered encounter medications on file as of 03/23/2020.    Contacted [name] Cruz's sister for general disease state call. Since last visit with CPP, the following interventions have been made, ED visit increased Vimpat from 200 mg daily to 200 mg twice daily . The patient  has had an ED visit since last contact due to diarrhea 10/28/19 and 10/09/19. The patient's current Chronic and PDC medications are: Atorvastatin 80 mg, 1 tablet daily, last filled 03/04/20 for 90 day supply .  [name] Cruz to review if patient has greater than 5 day gap between last fill. The patient denies problems with their health. The patient denies  problems with his pharmacy. The patient denies side effects with his medications. he denies  concerns or questions for [name] Cruz, Pharm. D at this time.   Sister states that patients diarrhea has improved since going to ED. Made follow up appointment with [name] Cruz for patient 05/04/20 at 9:30 am. Patient's sister will be the one doing  the telephone appointment. She is aware she should have his medications and any blood pressure or blood sugar readings available for review.    Follow-Up:  Pharmacist Review and Scheduled Follow-Up With Clinical Pharmacist  [name] Cruz, CPP notified  [name] Cruz, [facility] Clinical Pharmacy Assistant [phone]  I have  reviewed the care management and care coordination activities outlined in this encounter and I am certifying that I agree with the content of this note. No further action required.  [name] Cruz, PharmD Clinical Pharmacist [facility] at [facility] [phone]

## 2020-03-29 ENCOUNTER — Telehealth: Payer: Self-pay | Admitting: Family Medicine

## 2020-03-29 NOTE — Telephone Encounter (Signed)
Spoke with pt's sister/POA, Steward Drone, confirming she is in agreement that pt needs lift.  She says, yes, because she and her sister cannot get out of bed as easy anymore.  The have 2 donated lifts but neither one works anymore.

## 2020-03-29 NOTE — Telephone Encounter (Signed)
Pt mom Nicole Cella called in wanted to know about getting a lift for her son to help him get out of the bed. And how to go about getting one for him

## 2020-03-29 NOTE — Telephone Encounter (Signed)
Rx written for hoyer lift and in Lisa's box. I would have them call local DME supply store to ask how to go about getting hoyer lift for home.  Alternatively we could order rpt HHPT for assistance in setting up and training - last HH completed 12/2019.

## 2020-03-30 NOTE — Telephone Encounter (Signed)
Spoke with Steward Drone relaying Dr. Timoteo Expose message.  She verbalizes understanding and will call back with info about getting lift.  [Written rx in [place], broadcasting/film/video on [name], pending call back from [place].]

## 2020-04-02 DIAGNOSIS — I69354 Hemiplegia and hemiparesis following cerebral infarction affecting left non-dominant side: Secondary | ICD-10-CM | POA: Diagnosis not present

## 2020-04-02 DIAGNOSIS — I610 Nontraumatic intracerebral hemorrhage in hemisphere, subcortical: Secondary | ICD-10-CM | POA: Diagnosis not present

## 2020-04-12 NOTE — Telephone Encounter (Signed)
Spoke with Steward Drone asking for f/u about [facility] lift.  States they were able to get one they already had working.  She expresses her thanks, though.  I discarded written order.

## 2020-04-17 ENCOUNTER — Encounter: Payer: Self-pay | Admitting: Family Medicine

## 2020-04-17 ENCOUNTER — Telehealth: Payer: Self-pay

## 2020-04-17 NOTE — Telephone Encounter (Signed)
Pt's sister Steward Drone (DPR signed) said on 04/14/20 noticed spot on pts mouth that looked like it has burst open and bleeding some; Steward Drone does not think fever blister but wonders if pt had a slight seizure in sleep and bit lip. Steward Drone will send couple of pictures to Dr [name]. Dr [name] said that would be OK and will be looking for pictures. Steward Drone will send pictures thru my chart.

## 2020-04-18 MED ORDER — MUPIROCIN CALCIUM 2 % EX CREA: 1.0000 "application " | TOPICAL_CREAM | Freq: Two times a day (BID) | CUTANEOUS | 0 refills | Status: DC

## 2020-04-18 NOTE — Telephone Encounter (Signed)
Replied via mychart.

## 2020-04-18 NOTE — Telephone Encounter (Signed)
Noted  

## 2020-05-02 ENCOUNTER — Telehealth: Payer: Self-pay

## 2020-05-02 NOTE — Chronic Care Management (AMB) (Addendum)
    Chronic Care Management Pharmacy Assistant   Name: [name] Cruz  MRN: [id number] DOB: [birth year]  Reason for Encounter: Reminder for CCM Appointment on 05/04/20   Medications: Outpatient Encounter Medications as of 05/02/2020  Medication Sig   aspirin EC 81 MG tablet Take 1 tablet (81 mg total) by mouth daily. Swallow whole.   atorvastatin (LIPITOR) 80 MG tablet TAKE 1 TABLET BY MOUTH EVERY DAY   Cyanocobalamin (VITAMIN B-12) 1000 MCG SUBL Place 1,000 mcg under the tongue daily.   divalproex (DEPAKOTE) 500 MG DR tablet Take 1 tablet (500 mg total) by mouth 2 (two) times daily.   lacosamide (VIMPAT) 200 MG TABS tablet 1 tablet at 8 am and 1 tablet at 10 pm.   levETIRAcetam (KEPPRA) 750 MG tablet Take 2 tablets (1,500 mg total) by mouth 2 (two) times daily.   Multiple Vitamin (MULTIVITAMIN WITH MINERALS) TABS tablet Take 1 tablet by mouth daily.   mupirocin cream (BACTROBAN) 2 % Apply 1 application topically 2 (two) times daily.   sertraline (ZOLOFT) 25 MG tablet TAKE 1 TABLET BY MOUTH EVERY DAY   ziprasidone (GEODON) 20 MG capsule Take 1 capsule (20 mg total) by mouth at bedtime.   No facility-administered encounter medications on file as of 05/02/2020.   [name] Cruz was contacted to remind him of his upcoming telephone visit with [name] on 05/04/20 at 9:30 .  Unable to reach patient. Left message to remind him to have all medications, supplements and any blood glucose and blood pressure readings available for review at appointment.  Star Rating Drugs: Medication:  Last Fill: Day Supply Atorvastatin 80 mg 03/04/20 90  [name], CPP notified  [name] Cruz, [place] Clinical Pharmacy Assistant [phone]  I have reviewed the care management and care coordination activities outlined in this encounter and I am certifying that I agree with the content of this note. No further action required.  [name], PharmD Clinical Pharmacist [facility] at  [facility] [phone]

## 2020-05-03 DIAGNOSIS — I610 Nontraumatic intracerebral hemorrhage in hemisphere, subcortical: Secondary | ICD-10-CM | POA: Diagnosis not present

## 2020-05-03 DIAGNOSIS — I69354 Hemiplegia and hemiparesis following cerebral infarction affecting left non-dominant side: Secondary | ICD-10-CM | POA: Diagnosis not present

## 2020-05-04 ENCOUNTER — Ambulatory Visit (INDEPENDENT_AMBULATORY_CARE_PROVIDER_SITE_OTHER): Payer: Medicare Other

## 2020-05-04 ENCOUNTER — Other Ambulatory Visit: Payer: Self-pay

## 2020-05-04 DIAGNOSIS — E782 Mixed hyperlipidemia: Secondary | ICD-10-CM

## 2020-05-04 DIAGNOSIS — I10 Essential (primary) hypertension: Secondary | ICD-10-CM | POA: Diagnosis not present

## 2020-05-04 NOTE — Patient Instructions (Signed)
Dear [name] Cruz and family,  Below is a summary of the goals we discussed during our follow up appointment on May 04, 2020. Please contact me anytime with questions or concerns.   Visit Information  Patient Care Plan: CCM Pharmacy Care Plan    Problem Identified: CHL AMB "PATIENT-SPECIFIC PROBLEM"     Long-Range Goal: Disease Management   Start Date: 05/04/2020  Priority: High  Note:   Current Barriers:  . Per chart review, patient needs to follow up with cardiology.   Pharmacist Clinical Goal(s):  Marland Kitchen Patient will contact provider office for questions/concerns as evidenced notation of same in electronic health record through collaboration with PharmD and provider.   Interventions: . 1:1 collaboration with [name], MD regarding development and update of comprehensive plan of care as evidenced by provider attestation and co-signature . Inter-disciplinary care team collaboration (see longitudinal plan of care) . Comprehensive medication review performed; medication list updated in electronic medical record  Hypertension (BP goal <130/80 mmHg) -Controlled - home and clinic BP within goal -Current treatment:  None -Medications previously tried: carvedilol - stopped 01/2020 after patient ran out, formally d/c during hospital 07/21 for stroke  -Current home readings: checks periodically at home - 125/70s -Current dietary habits: regular diet, good appetite -Current exercise habits: limited mobility -Denies hypotensive/hypertensive symptoms -Educated on BP goals and benefits of medications for prevention of heart attack, stroke and kidney damage; BP goal 130/80 per neurology, instructed to call if BP consistently above this. -Counseled to monitor BP at home bimonthly, document, and provide log at future appointments -Recommended to continue current medication  Hyperlipidemia/History of CVA: (LDL goal < 70) -Not ideally controlled - LDL 78 -Current  treatment:  Atorvastatin 80 mg - 1 tablet daily  Aspirin 81 mg - 1 tablet daily -Medications previously tried: Zetia - unclear reason for d/c 01/2018 per cardiology -Pt was on atorvastatin 80 mg prior to last lipid panel with LDL 78. Confirms adherence. Consider aggressive goal < 70 with CV history. Consider restart Zetia.  -Educated on Cholesterol goals;  Benefits of statin for ASCVD risk reduction; -Recommended  consider additional therapy following next lipid panel.   Cognitive Impairment/Mood (Goal: Control symptoms) -Controlled - per daughter report -States doing fine with Geodon dose reduction Feb 2022. -Current treatment:  Ziprasidone 20 mg (Geodon)- 1 tablet at bedtime  Sertraline 25 mg - 1 tablet daily  -Medications previously tried/failed: none reported -Recommended to continue current medication  Seizures (Goal: Prevent seizures) -Controlled - per daughter report -Current treatment   Levetiracetam 750 mg (Keppra) - 2 tablets (1500 mg) twice daily  Lacosamide 200 mg (Vimpat) - 1 tablet in the morning (8 AM) and 1 tabs at night (10 PM)  Depakote 500 mg - 1 tablet twice daily  -Medications previously tried: none reported -Discussed adherence, she denies any recent issues with running out of medications between refills.  -Recommended to continue current medication  OTCs: confirms multivitamin, B12 1000 mcg SL daily  Patient Goals/Self-Care Activities . Patient will:  - focus on medication adherence by continuing to use pillbox; Contact pharmacist if any pharmacy delays/concerns with refills.  -Schedule a follow up visit with cardiologist  Follow Up Plan: Telephone follow up appointment with care management team member scheduled for: 6 months      The patient verbalized understanding of instructions, educational materials, and care plan provided today and agreed to receive a mailed copy of patient instructions, educational materials, and care plan.   [name], PharmD Clinical  Pharmacist [facility] at [facility] [phone]  Basics of Medicine Management Taking your medicines correctly is an important part of managing or preventing medical problems. Make sure you know what disease or condition your medicine is treating, and how and when to take it. If you do not take your medicine correctly, it may not work well and may cause unpleasant side effects, including serious health problems. What should I do when I am taking medicines?  Read all the labels and inserts that come with your medicines. Review the information often.  Talk with your pharmacist if you get a refill and notice a change in the size, color, or shape of your medicines.  Know the potential side effects for each medicine that you take.  Try to get all your medicines from the same pharmacy. The pharmacist will have all your information and will understand how your medicines will affect each other (interact).  Tell your health care provider about all your medicines, including over-the-counter medicines, vitamins, and herbal or dietary supplements. He or she will make sure that nothing will interact with any of your prescribed medicines.   How can I take my medicines safely?  Take medicines only as told by your health care provider. ? Do not take more of your medicine than instructed. ? Do not take anyone else's medicines. ? Do not share your medicines with others. ? Do not stop taking your medicines unless your health care provider tells you to do so. ? You may need to avoid alcohol or certain foods or liquids when taking certain medicines. Follow your health care provider's instructions.  Do not split, mash, or chew your medicines unless your health care provider tells you to do so. Tell your health care provider if you have trouble swallowing your medicines.  For liquid medicine, use the dosing container that was provided. How should I organize my  medicines? Know your medicines  Know what each of your medicines looks like. This includes size, color, and shape. Tell your health care provider if you are having trouble recognizing all the medicines that you are taking.  If you cannot tell your medicines apart because they look similar, keep them in original bottles.  If you cannot read the labels on the bottles, tell your pharmacist to put your medicines in containers with large print.  Review your medicines and your schedule with family members, a friend, or a caregiver. Use a pill organizer  Use a tool to organize your medicine schedule. Tools include a weekly pillbox, a written chart, a notebook, or a calendar.  Your tool should help you remember the following things about each medicine: ? The name of the medicine. ? The amount (dose) to take. ? The schedule. This is the day and time the medicine should be taken. ? The appearance. This includes color, shape, size, and stamp. ? How to take your medicines. This includes instructions to take them with food, without food, with fluids, or with other medicines.  Create reminders for taking your medicines. Use sticky notes, or alarms on your watch, mobile device, or phone calendar.  You may choose to use a more advanced management system. These systems have storage, alarms, and visual and audio prompts.  Some medicines can be taken on an "as-needed" basis. These include medicines for nausea or pain. If you take an as-needed medicine, write down the name and dose, as well as the date and time that you took it.   How should I plan  for travel?  Take your pillbox, medicines, and organization system with you when traveling.  Have your medicines refilled before you travel. This will ensure that you do not run out of your medicines while you are away from home.  Always carry an updated list of your medicines with you. If there is an emergency, a first responder can quickly see what medicines  you are taking.  Do not pack your medicines in checked luggage in case your luggage is lost or delayed.  If any of your medicines is considered a controlled substance, make sure you bring a letter from your health care provider with you. How should I store and discard my medicines? For safe storage:  Store medicines in a cool, dry area away from light, or as directed by your health care provider. Do not store medicines in the bathroom. Heat and humidity will affect them.  Do not store your medicines with other chemicals, or with medicines for pets or other household members.  Keep medicines away from children and pets. Do not leave them on counters or bedside tables. Store them in high cabinets or on high shelves. For safe disposal:  Check expiration dates regularly. Do not take expired medicines. Discard medicines that are older than the expiration date.  Learn a safe way to dispose of your medicines. You may: ? Use a local government, hospital, or pharmacy medicine-take-back program. ? Mix the medicines with inedible substances, put them in a sealed bag or empty container, and throw them in the trash. What should I remember?  Tell your health care provider if you: ? Experience side effects. ? Have new symptoms. ? Have other concerns about taking your medicines.  Review your medicines regularly with your health care provider. Other medicines, diet, medical conditions, weight changes, and daily habits can all affect how medicines work. Ask if you need to continue taking each medicine, and discuss how well each one is working.  Refill your medicines early to avoid running out of them.  In case of an accidental overdose, call your local Poison Control Center at (715) 111-9076 or visit your local emergency department immediately. This is important. Summary  Taking your medicines correctly is an important part of managing or preventing medical problems.  You need to make sure that you  understand what you are taking a medicine for, as well as how and when you need to take it.  Know your medicines and use a pill organizer to help you take your medicines correctly.  In case of an accidental overdose, call your local Poison Control Center at 604-784-8890 or visit your local emergency department immediately. This is important. This information is not intended to replace advice given to you by your health care provider. Make sure you discuss any questions you have with your health care provider. Document Revised: 12/26/2016 Document Reviewed: 12/26/2016 Elsevier Patient Education  2021 [employer].

## 2020-05-04 NOTE — Progress Notes (Signed)
Chronic Care Management Pharmacy Note  05/04/2020 Name:  [name] Cruz MRN:  [id number] DOB:  November 27, 1955  Subjective: [name] Cruz is an 65 y.o. year old male who is a primary patient of Ria Bush, MD.  The CCM team was consulted for assistance with disease management and care coordination needs. S  Engaged with patient by telephone for follow up visit in response to provider referral for pharmacy case management and/or care coordination services. Spoke with patient's sister, [name] Cruz. Reports Geodon dose decrease is going well. She states his mouth sores have healed.  Consent to Services:  The patient was given information about Chronic Care Management services, agreed to services, and gave verbal consent prior to initiation of services.  Please see initial visit note for detailed documentation.   Patient Care Team: Ria Bush, MD as PCP - General (Family Medicine) Jettie Booze, MD as PCP - Cardiology (Cardiology) Debbora Dus, [facility] as Pharmacist (Pharmacist) Garvin Fila, MD as Consulting Physician (Neurology) Frann Rider, NP as Nurse Practitioner (Neurology)  Recent office visits: 03/13/20- Dr. Danise Mina- PCP- Recurrent seizures when he's run out of medications. Currently has refills of all needed meds. Continue 3 drug regimen of vimpat, depakote, keppra. Appreciate neurology care. Decreased Geodon from 40 mg to 20 mg daily.   Recent consult visits:  03/06/20- Frann Rider, NP- Neurology- Restarted Aspirin 81 mg for stroke prevention  Hospital visits: 10/28/19 - ED -  Chronic diarrhea, Increased Vimpat from 200 mg daily to 200 mg twice daily  10/09/19 - ED - Diarrhea  07/19/19 - Admission, Hemorrhagic stroke  06/25/19 - Admission, Status epilepticus  Objective:  Lab Results  Component Value Date   CREATININE 0.52 (L) 10/28/2019   BUN 12 10/28/2019   GFR 105.15 06/18/2018   GFRNONAA >60 10/28/2019   GFRAA >60 10/09/2019   NA 143 10/28/2019   K  3.7 10/28/2019   CALCIUM 8.0 (L) 10/28/2019   CO2 27 10/28/2019   GLUCOSE 93 10/28/2019    Lab Results  Component Value Date/Time   HGBA1C 5.6 07/14/2019 12:07 PM   HGBA1C 5.6 07/09/2018 04:16 AM   GFR 105.15 06/18/2018 01:01 PM   GFR 87.39 10/03/2017 10:52 AM   MICROALBUR <0.7 06/18/2018 01:10 PM    Lab Results  Component Value Date   CHOL 143 07/14/2019   HDL 35.90 (L) 07/14/2019   LDLCALC 78 07/14/2019   LDLDIRECT 122.0 07/22/2016   TRIG 148.0 07/14/2019   CHOLHDL 4 07/14/2019    Hepatic Function Latest Ref Rng & Units 10/09/2019 07/27/2019 07/19/2019  Total Protein 6.5 - 8.1 g/dL 5.7(L) 5.7(L) 6.0(L)  Albumin 3.5 - 5.0 g/dL 3.1(L) 3.0(L) 3.6  AST 15 - 41 U/L $Remo'25 18 17  'idmOG$ ALT 0 - 44 U/L $Remo'23 15 14  'zzPxj$ Alk Phosphatase 38 - 126 U/L 65 38 42  Total Bilirubin 0.3 - 1.2 mg/dL 0.3 0.3 0.5  Bilirubin, Direct 0.0 - 0.2 mg/dL - - -    Lab Results  Component Value Date/Time   TSH 1.179 10/28/2019 06:22 PM   TSH 1.24 06/18/2018 01:01 PM   TSH 0.576 03/25/2018 10:21 PM   TSH 0.75 10/17/2014 12:57 PM    CBC Latest Ref Rng & Units 10/28/2019 10/09/2019 08/02/2019  WBC 4.0 - 10.5 K/uL 6.0 8.7 9.9  Hemoglobin 13.0 - 17.0 g/dL 11.2(L) 12.9(L) 12.5(L)  Hematocrit 39.0 - 52.0 % 34.9(L) 40.2 38.2(L)  Platelets 150 - 400 K/uL PLATELET CLUMPS NOTED ON SMEAR, UNABLE TO ESTIMATE 227 314    Lab  Results  Component Value Date/Time   VD25OH 34.54 07/22/2016 09:59 AM   Clinical ASCVD: Yes  The ASCVD Risk score Mikey Bussing DC Jr., et al., 2013) failed to calculate for the following reasons:   The patient has a prior MI or stroke diagnosis    Depression screen [facility] 2/9 06/17/2018 01/28/2017 10/20/2015  Decreased Interest 0 0 0  Down, Depressed, Hopeless 0 0 0  PHQ - 2 Score 0 0 0  Altered sleeping 0 - -  Tired, decreased energy 0 - -  Change in appetite 0 - -  Feeling bad or failure about yourself  0 - -  Trouble concentrating 0 - -  Moving slowly or fidgety/restless 0 - -  Suicidal thoughts 0 - -   PHQ-9 Score 0 - -  Difficult doing work/chores Not difficult at all - -  Some recent data might be hidden     Social History   Tobacco Use  Smoking Status Never Smoker  Smokeless Tobacco Former User   BP Readings from Last 3 Encounters:  03/13/20 112/64  10/28/19 (!) 154/74  10/22/19 104/62   Pulse Readings from Last 3 Encounters:  03/13/20 64  10/28/19 100  10/22/19 76   Wt Readings from Last 3 Encounters:  10/09/19 172 lb 9.9 oz (78.3 kg)  07/26/19 172 lb 9.9 oz (78.3 kg)  07/19/19 186 lb 4.6 oz (84.5 kg)   BMI Readings from Last 3 Encounters:  03/13/20 30.58 kg/m  10/22/19 30.58 kg/m  10/09/19 30.58 kg/m    Assessment/Interventions: Review of patient past medical history, allergies, medications, health status, including review of consultants reports, laboratory and other test data, was performed as part of comprehensive evaluation and provision of chronic care management services.   SDOH:  (Social Determinants of Health) assessments and interventions performed: Yes SDOH Interventions   Flowsheet Row Most Recent Value  SDOH Interventions   Financial Strain Interventions Intervention Not Indicated  [Medications affordable]     SDOH Screenings   Alcohol Screen: Not on file  Depression (KGU5-4): Not on file  Financial Resource Strain: Low Risk   . Difficulty of Paying Living Expenses: Not very hard  Food Insecurity: Not on file  Housing: Not on file  Physical Activity: Not on file  Social Connections: Not on file  Stress: Not on file  Tobacco Use: Medium Risk  . Smoking Tobacco Use: Never Smoker  . Smokeless Tobacco Use: Former Soil scientist Needs: Not on file    [place]  Allergies  Allergen Reactions  . Losartan Other (See Comments)    hyperkalemia    Medications Reviewed Today    Reviewed by Debbora Dus, [facility] (Pharmacist) on 05/04/20 at 91  Med List Status: <None>  Medication Order Taking? Sig Documenting Provider Last Dose  Status Informant  aspirin EC 81 MG tablet [id number] Yes Take 1 tablet (81 mg total) by mouth daily. Swallow whole. Frann Rider, NP Taking Active   atorvastatin (LIPITOR) 80 MG tablet [id number] Yes TAKE 1 TABLET BY MOUTH EVERY DAY Ria Bush, MD Taking Active   Cyanocobalamin (VITAMIN B-12) 1000 MCG SUBL [id number] Yes Place 1,000 mcg under the tongue daily. [provider] Taking Active Family Member  divalproex (DEPAKOTE) 500 MG DR tablet [id number] Yes Take 1 tablet (500 mg total) by mouth 2 (two) times daily. Frann Rider, NP Taking Active   lacosamide (VIMPAT) 200 MG TABS tablet [id number] Yes 1 tablet at 8 am and 1 tablet at 10 pm. Frann Rider, NP Taking Active  levETIRAcetam (KEPPRA) 750 MG tablet [id number] Yes Take 2 tablets (1,500 mg total) by mouth 2 (two) times daily. Frann Rider, NP Taking Active   Multiple Vitamin (MULTIVITAMIN WITH MINERALS) TABS tablet [id number] Yes Take 1 tablet by mouth daily. Ria Bush, MD Taking Active Family Member  mupirocin cream (BACTROBAN) 2 % [id number] No Apply 1 application topically 2 (two) times daily.  Patient not taking: Reported on 05/04/2020   Ria Bush, MD Not Taking Consider Medication Status and Discontinue   sertraline (ZOLOFT) 25 MG tablet [id number] Yes TAKE 1 TABLET BY MOUTH EVERY DAY Ria Bush, MD Taking Active   ziprasidone (GEODON) 20 MG capsule [id number] Yes Take 1 capsule (20 mg total) by mouth at bedtime. Ria Bush, MD Taking Active           Patient Active Problem List   Diagnosis Date Noted  . Cough 03/16/2020  . Bowel and bladder incontinence 10/22/2019  . SAH (subarachnoid hemorrhage) ([place])   . Neurogenic bladder   . Hemiparesis affecting left side as late effect of stroke ([place])   . Thalamic hemorrhage ([place]) 07/26/2019  . History of DVT in adulthood 02/21/2019  . PAD (peripheral artery disease) ([place]) 12/14/2018  . Agitation 09/04/2018  . Acute CVA (cerebrovascular  accident) ([place]) 08/07/2018  . Cerebellar stroke, acute ([place]) 07/10/2018  . Status epilepticus ([place]) 03/25/2018  . Unilateral occipital headache 10/03/2017  . Ischemic stroke ([place]) 08/23/2017  . Coronary artery disease involving coronary bypass graft of native heart without angina pectoris   . Atrial fibrillation ([place])   . Dysphagia, post-stroke   . Cardiac arrest ([place]) 08/09/2017  . S/P CABG x 3 02/24/2017  . NSTEMI (non-ST elevated myocardial infarction) ([place]) 02/21/2017  . Encephalomalacia 10/28/2012  . Seizure disorder ([place]) 04/26/2012  . Alcohol abuse 04/26/2012  . History of stroke with current residual effects   . Cognitive impairment   . HLD (hyperlipidemia)   . HTN (hypertension)     Immunization History  Administered Date(s) Administered  . Influenza Inj Mdck Quad Pf 10/03/2018  . Influenza, High Dose Seasonal PF 12/30/2016  . Influenza, Seasonal, Injecte, Preservative Fre 09/02/2012  . Influenza,inj,Quad PF,6+ Mos 10/17/2014, 10/20/2015, 10/03/2017  . Influenza-Unspecified 10/02/2013  . Pneumococcal Conjugate-13 10/03/2018  . Pneumococcal Polysaccharide-23 04/27/2012  . Td 10/28/2012    Conditions to be addressed/monitored:  Hypertension, Hyperlipidemia and Cognitive impairment, seizures  Patient Care Plan: CCM Pharmacy Care Plan    Problem Identified: CHL AMB "PATIENT-SPECIFIC PROBLEM"     Long-Range Goal: Disease Management   Start Date: 05/04/2020  Priority: High  Note:   Current Barriers:  . Per chart review, patient needs to follow up with cardiology.   Pharmacist Clinical Goal(s):  Marland Kitchen Patient will contact provider office for questions/concerns as evidenced notation of same in electronic health record through collaboration with PharmD and provider.   Interventions: . 1:1 collaboration with Ria Bush, MD regarding development and update of comprehensive plan of care as evidenced by provider attestation and co-signature . Inter-disciplinary care  team collaboration (see longitudinal plan of care) . Comprehensive medication review performed; medication list updated in electronic medical record  Hypertension (BP goal <130/80 mmHg) -Controlled - home and clinic BP within goal -Current treatment:  None -Medications previously tried: carvedilol - stopped 01/2020 after patient ran out, formally d/c during hospital 07/21 for stroke  -Current home readings: checks periodically at home - 125/70s -Current dietary habits: regular diet, good appetite -Current exercise habits: limited mobility -Denies hypotensive/hypertensive symptoms -Educated on BP goals and  benefits of medications for prevention of heart attack, stroke and kidney damage; BP goal 130/80 per neurology, instructed to call if BP consistently above this. -Counseled to monitor BP at home bimonthly, document, and provide log at future appointments -Recommended to continue current medication  Hyperlipidemia/History of CVA: (LDL goal < 70) -Not ideally controlled - LDL 78 -Current treatment:  Atorvastatin 80 mg - 1 tablet daily  Aspirin 81 mg - 1 tablet daily -Medications previously tried: Zetia - unclear reason for d/c 01/2018 per cardiology -Pt was on atorvastatin 80 mg prior to last lipid panel with LDL 78. Confirms adherence. Consider aggressive goal < 70 with CV history. Consider restart Zetia.  -Educated on Cholesterol goals;  Benefits of statin for ASCVD risk reduction; -Recommended  consider additional therapy following next lipid panel.   Cognitive Impairment/Mood (Goal: Control symptoms) -Controlled - per daughter report -States doing fine with Geodon dose reduction Feb 2022. -Current treatment:  Ziprasidone 20 mg (Geodon)- 1 tablet at bedtime  Sertraline 25 mg - 1 tablet daily  -Medications previously tried/failed: none reported -Recommended to continue current medication  Seizures (Goal: Prevent seizures) -Controlled - per daughter report -Current treatment    Levetiracetam 750 mg (Keppra) - 2 tablets (1500 mg) twice daily  Lacosamide 200 mg (Vimpat) - 1 tablet in the morning (8 AM) and 1 tabs at night (10 PM)  Depakote 500 mg - 1 tablet twice daily  -Medications previously tried: none reported -Discussed adherence, she denies any recent issues with running out of medications between refills.  -Recommended to continue current medication  OTCs: confirms multivitamin, B12 1000 mcg SL daily  Patient Goals/Self-Care Activities . Patient will:  - focus on medication adherence by continuing to use pillbox; Contact pharmacist if any pharmacy delays/concerns with refills.  -Schedule a follow up visit with cardiologist  Follow Up Plan: Telephone follow up appointment with care management team member scheduled for: 6 months    Medication Assistance: None required.  Patient affirms current coverage meets needs.  Star Rating Drugs: Medication:                Last Fill:         Day Supply Atorvastatin 80 mg   03/04/20            90  Patient's preferred pharmacy is:  [id number] - [place], [place] [address] Phone: [phone] Fax: [phone]   [facility] [place]  Social support: [name] Cruz, picks up the medications every Friday and fills weekly pillboxes; [name] Cruz (sister) helps administer the medications. He lives in his home with his sister, [name] Cruz, and mom. [name] Cruz provides 24 hour care.  Uses pill box? Yes Pt endorses 100% compliance  Care Plan and Follow Up Patient Decision:  Patient agrees to Care Plan and Follow-up.  Debbora Dus, PharmD Clinical Pharmacist [facility] at [facility] [phone]

## 2020-05-04 NOTE — Progress Notes (Signed)
Encounter details: CCM Time Spent       Value Time User   Time spent with patient (minutes)  50 05/04/2020 10:41 AM [name], RPH   Time spent performing Chart review  30 05/04/2020 10:41 AM [name], [facility]   Total time (minutes)  80 05/04/2020 10:41 AM [name], RPH      Moderate to High Complex Decision Making       Value Time User   Moderate to High complex decision making  Yes 05/04/2020 10:41 AM [name], [facility]      CCM Services: This encounter meets complex CCM services and moderate to high decision making.  Prior to outreach and patient consent for Chronic Care Management, I referred this patient for services after reviewing the nominated patient list or from a personal encounter with the patient.  I have personally reviewed this encounter including the documentation in this note and have collaborated with the care management provider regarding care management and care coordination activities to include development and update of the comprehensive care plan. I am certifying that I agree with the content of this note and encounter as supervising physician.

## 2020-05-16 ENCOUNTER — Other Ambulatory Visit: Payer: Self-pay | Admitting: Family Medicine

## 2020-05-24 ENCOUNTER — Other Ambulatory Visit: Payer: Self-pay | Admitting: Family Medicine

## 2020-05-25 ENCOUNTER — Emergency Department (HOSPITAL_COMMUNITY): Payer: Medicare Other

## 2020-05-25 ENCOUNTER — Encounter (HOSPITAL_COMMUNITY): Payer: Self-pay

## 2020-05-25 ENCOUNTER — Other Ambulatory Visit: Payer: Self-pay

## 2020-05-25 ENCOUNTER — Inpatient Hospital Stay (HOSPITAL_COMMUNITY)
Admission: EM | Admit: 2020-05-25 | Discharge: 2020-05-31 | DRG: 100 | Disposition: A | Discharge status: Home Health | Payer: Medicare Other | Attending: Internal Medicine | Admitting: Internal Medicine

## 2020-05-25 DIAGNOSIS — D649 Anemia, unspecified: Secondary | ICD-10-CM | POA: Diagnosis not present

## 2020-05-25 DIAGNOSIS — Z20822 Contact with and (suspected) exposure to covid-19: Secondary | ICD-10-CM | POA: Diagnosis present

## 2020-05-25 DIAGNOSIS — Z79899 Other long term (current) drug therapy: Secondary | ICD-10-CM | POA: Diagnosis not present

## 2020-05-25 DIAGNOSIS — I469 Cardiac arrest, cause unspecified: Secondary | ICD-10-CM | POA: Diagnosis not present

## 2020-05-25 DIAGNOSIS — Z7982 Long term (current) use of aspirin: Secondary | ICD-10-CM | POA: Diagnosis not present

## 2020-05-25 DIAGNOSIS — I672 Cerebral atherosclerosis: Secondary | ICD-10-CM | POA: Diagnosis not present

## 2020-05-25 DIAGNOSIS — E785 Hyperlipidemia, unspecified: Secondary | ICD-10-CM | POA: Diagnosis present

## 2020-05-25 DIAGNOSIS — R4189 Other symptoms and signs involving cognitive functions and awareness: Secondary | ICD-10-CM | POA: Diagnosis present

## 2020-05-25 DIAGNOSIS — I69354 Hemiplegia and hemiparesis following cerebral infarction affecting left non-dominant side: Secondary | ICD-10-CM

## 2020-05-25 DIAGNOSIS — I63233 Cerebral infarction due to unspecified occlusion or stenosis of bilateral carotid arteries: Secondary | ICD-10-CM | POA: Diagnosis not present

## 2020-05-25 DIAGNOSIS — R404 Transient alteration of awareness: Secondary | ICD-10-CM | POA: Diagnosis not present

## 2020-05-25 DIAGNOSIS — G40909 Epilepsy, unspecified, not intractable, without status epilepticus: Secondary | ICD-10-CM

## 2020-05-25 DIAGNOSIS — G40901 Epilepsy, unspecified, not intractable, with status epilepticus: Principal | ICD-10-CM

## 2020-05-25 DIAGNOSIS — I6503 Occlusion and stenosis of bilateral vertebral arteries: Secondary | ICD-10-CM | POA: Diagnosis not present

## 2020-05-25 DIAGNOSIS — Z6830 Body mass index (BMI) 30.0-30.9, adult: Secondary | ICD-10-CM

## 2020-05-25 DIAGNOSIS — E876 Hypokalemia: Secondary | ICD-10-CM | POA: Diagnosis not present

## 2020-05-25 DIAGNOSIS — M24542 Contracture, left hand: Secondary | ICD-10-CM | POA: Diagnosis present

## 2020-05-25 DIAGNOSIS — I69319 Unspecified symptoms and signs involving cognitive functions following cerebral infarction: Secondary | ICD-10-CM

## 2020-05-25 DIAGNOSIS — I2510 Atherosclerotic heart disease of native coronary artery without angina pectoris: Secondary | ICD-10-CM | POA: Diagnosis present

## 2020-05-25 DIAGNOSIS — I6930 Unspecified sequelae of cerebral infarction: Secondary | ICD-10-CM

## 2020-05-25 DIAGNOSIS — I252 Old myocardial infarction: Secondary | ICD-10-CM

## 2020-05-25 DIAGNOSIS — Z951 Presence of aortocoronary bypass graft: Secondary | ICD-10-CM

## 2020-05-25 DIAGNOSIS — Z86718 Personal history of other venous thrombosis and embolism: Secondary | ICD-10-CM | POA: Diagnosis not present

## 2020-05-25 DIAGNOSIS — I771 Stricture of artery: Secondary | ICD-10-CM | POA: Diagnosis not present

## 2020-05-25 DIAGNOSIS — J690 Pneumonitis due to inhalation of food and vomit: Secondary | ICD-10-CM | POA: Diagnosis present

## 2020-05-25 DIAGNOSIS — I69398 Other sequelae of cerebral infarction: Secondary | ICD-10-CM | POA: Diagnosis not present

## 2020-05-25 DIAGNOSIS — H5461 Unqualified visual loss, right eye, normal vision left eye: Secondary | ICD-10-CM | POA: Diagnosis not present

## 2020-05-25 DIAGNOSIS — I4891 Unspecified atrial fibrillation: Secondary | ICD-10-CM | POA: Diagnosis present

## 2020-05-25 DIAGNOSIS — I10 Essential (primary) hypertension: Secondary | ICD-10-CM | POA: Diagnosis present

## 2020-05-25 DIAGNOSIS — E669 Obesity, unspecified: Secondary | ICD-10-CM | POA: Diagnosis present

## 2020-05-25 DIAGNOSIS — I499 Cardiac arrhythmia, unspecified: Secondary | ICD-10-CM | POA: Diagnosis not present

## 2020-05-25 DIAGNOSIS — Z888 Allergy status to other drugs, medicaments and biological substances status: Secondary | ICD-10-CM

## 2020-05-25 DIAGNOSIS — E871 Hypo-osmolality and hyponatremia: Secondary | ICD-10-CM | POA: Diagnosis present

## 2020-05-25 DIAGNOSIS — Z87891 Personal history of nicotine dependence: Secondary | ICD-10-CM

## 2020-05-25 DIAGNOSIS — R569 Unspecified convulsions: Secondary | ICD-10-CM

## 2020-05-25 DIAGNOSIS — R6889 Other general symptoms and signs: Secondary | ICD-10-CM | POA: Diagnosis not present

## 2020-05-25 DIAGNOSIS — I517 Cardiomegaly: Secondary | ICD-10-CM | POA: Diagnosis not present

## 2020-05-25 DIAGNOSIS — Z8674 Personal history of sudden cardiac arrest: Secondary | ICD-10-CM

## 2020-05-25 DIAGNOSIS — R21 Rash and other nonspecific skin eruption: Secondary | ICD-10-CM | POA: Diagnosis not present

## 2020-05-25 DIAGNOSIS — R059 Cough, unspecified: Secondary | ICD-10-CM | POA: Diagnosis not present

## 2020-05-25 DIAGNOSIS — Z743 Need for continuous supervision: Secondary | ICD-10-CM | POA: Diagnosis not present

## 2020-05-25 DIAGNOSIS — G9389 Other specified disorders of brain: Secondary | ICD-10-CM | POA: Diagnosis not present

## 2020-05-25 DIAGNOSIS — R29810 Facial weakness: Secondary | ICD-10-CM | POA: Diagnosis not present

## 2020-05-25 DIAGNOSIS — R279 Unspecified lack of coordination: Secondary | ICD-10-CM | POA: Diagnosis not present

## 2020-05-25 DIAGNOSIS — J3489 Other specified disorders of nose and nasal sinuses: Secondary | ICD-10-CM | POA: Diagnosis not present

## 2020-05-25 LAB — CBC WITH DIFFERENTIAL/PLATELET
Abs Immature Granulocytes: 0.05 10*3/uL (ref 0.00–0.07)
Basophils Absolute: 0 10*3/uL (ref 0.0–0.1)
Basophils Relative: 0 %
Eosinophils Absolute: 0.1 10*3/uL (ref 0.0–0.5)
Eosinophils Relative: 1 %
HCT: 41.5 % (ref 39.0–52.0)
Hemoglobin: 14 g/dL (ref 13.0–17.0)
Immature Granulocytes: 1 %
Lymphocytes Relative: 35 %
Lymphs Abs: 3.3 10*3/uL (ref 0.7–4.0)
MCH: 32.1 pg (ref 26.0–34.0)
MCHC: 33.7 g/dL (ref 30.0–36.0)
MCV: 95.2 fL (ref 80.0–100.0)
Monocytes Absolute: 0.8 10*3/uL (ref 0.1–1.0)
Monocytes Relative: 9 %
Neutro Abs: 5.3 10*3/uL (ref 1.7–7.7)
Neutrophils Relative %: 54 %
Platelets: 228 10*3/uL (ref 150–400)
RBC: 4.36 MIL/uL (ref 4.22–5.81)
RDW: 12.9 % (ref 11.5–15.5)
WBC: 9.7 10*3/uL (ref 4.0–10.5)
nRBC: 0 % (ref 0.0–0.2)

## 2020-05-25 LAB — MAGNESIUM: Magnesium: 1.9 mg/dL (ref 1.7–2.4)

## 2020-05-25 LAB — VALPROIC ACID LEVEL: Valproic Acid Lvl: 49 ug/mL — ABNORMAL LOW (ref 50.0–100.0)

## 2020-05-25 LAB — RESP PANEL BY RT-PCR (FLU A&B, COVID) ARPGX2
Influenza A by PCR: NEGATIVE
Influenza B by PCR: NEGATIVE
SARS Coronavirus 2 by RT PCR: NEGATIVE

## 2020-05-25 LAB — BASIC METABOLIC PANEL
Anion gap: 7 (ref 5–15)
BUN: 10 mg/dL (ref 8–23)
CO2: 27 mmol/L (ref 22–32)
Calcium: 8.7 mg/dL — ABNORMAL LOW (ref 8.9–10.3)
Chloride: 98 mmol/L (ref 98–111)
Creatinine, Ser: 0.67 mg/dL (ref 0.61–1.24)
GFR, Estimated: >60 mL/min (ref >60)
Glucose, Bld: 111 mg/dL — ABNORMAL HIGH (ref 70–99)
Potassium: 4.4 mmol/L (ref 3.5–5.1)
Sodium: 132 mmol/L — ABNORMAL LOW (ref 135–145)

## 2020-05-25 LAB — CBG MONITORING, ED: Glucose-Capillary: 102 mg/dL — ABNORMAL HIGH (ref 70–99)

## 2020-05-25 MED ORDER — LORAZEPAM 2 MG/ML IJ SOLN: 2.0000 mg | Freq: Once | INTRAMUSCULAR | Status: AC

## 2020-05-25 MED ORDER — IOHEXOL 350 MG/ML SOLN
50.0000 mL | Freq: Once | INTRAVENOUS | Status: AC | PRN
  Administered 2020-05-25: 50 mL via INTRAVENOUS

## 2020-05-25 MED ORDER — LEVETIRACETAM IN NACL 500 MG/100ML IV SOLN
500.0000 mg | Freq: Once | INTRAVENOUS | Status: AC
  Administered 2020-05-25: 500 mg via INTRAVENOUS
  Filled 2020-05-25: qty 100

## 2020-05-25 MED ORDER — LEVETIRACETAM IN NACL 1500 MG/100ML IV SOLN
1500.0000 mg | INTRAVENOUS | Status: AC
  Administered 2020-05-25 (×2): 1500 mg via INTRAVENOUS
  Filled 2020-05-25 (×2): qty 100

## 2020-05-25 MED ORDER — LEVETIRACETAM IN NACL 1000 MG/100ML IV SOLN
1000.0000 mg | Freq: Once | INTRAVENOUS | Status: AC
  Administered 2020-05-25: 1000 mg via INTRAVENOUS

## 2020-05-25 MED ORDER — LEVETIRACETAM IN NACL 1500 MG/100ML IV SOLN
1500.0000 mg | Freq: Two times a day (BID) | INTRAVENOUS | Status: DC
  Administered 2020-05-26 – 2020-05-29 (×7): 1500 mg via INTRAVENOUS
  Filled 2020-05-25 (×7): qty 100

## 2020-05-25 MED ORDER — SODIUM CHLORIDE 0.9 % IV SOLN: 4500.0000 mg | Freq: Once | INTRAVENOUS | Status: DC

## 2020-05-25 MED ORDER — LORAZEPAM 2 MG/ML IJ SOLN: 4.0000 mg | Freq: Once | INTRAMUSCULAR | Status: DC

## 2020-05-25 MED ORDER — VALPROATE SODIUM 100 MG/ML IV SOLN
500.0000 mg | Freq: Three times a day (TID) | INTRAVENOUS | Status: DC
  Administered 2020-05-25 – 2020-05-29 (×10): 500 mg via INTRAVENOUS
  Filled 2020-05-25 (×13): qty 5

## 2020-05-25 MED ORDER — LEVETIRACETAM IN NACL 1000 MG/100ML IV SOLN
1000.0000 mg | INTRAVENOUS | Status: DC
  Administered 2020-05-25: 1000 mg via INTRAVENOUS
  Filled 2020-05-25 (×2): qty 100

## 2020-05-25 MED ORDER — SODIUM CHLORIDE 0.9 % IV SOLN: 3500.0000 mg | Freq: Once | INTRAVENOUS | Status: DC

## 2020-05-25 MED ORDER — LORAZEPAM 2 MG/ML IJ SOLN
INTRAMUSCULAR | Status: AC
  Administered 2020-05-25: 2 mg via INTRAVENOUS
  Filled 2020-05-25: qty 1

## 2020-05-25 MED ORDER — SODIUM CHLORIDE 0.9 % IV SOLN
200.0000 mg | Freq: Two times a day (BID) | INTRAVENOUS | Status: DC
  Administered 2020-05-25 – 2020-05-28 (×7): 200 mg via INTRAVENOUS
  Filled 2020-05-25 (×9): qty 20

## 2020-05-25 NOTE — ED Notes (Signed)
TOTAL DOSE OF KEPPRA RECEIVED 4500mg 

## 2020-05-25 NOTE — Consult Note (Signed)
Neurology Consultation Reason for Consult: Seizures Referring Physician: Roselyn Bering  CC: Seizures  History is obtained from: Family  HPI: [name] Cruz is a 65 y.o. male with a history of previous stroke with left hemiparesis who was in his normal state of health until earlier tonight when he began having activity concerning for seizure.  He was initially just unresponsive, but on arrival he began having left gaze deviation and has some jerking activity as well.  His last seizure was in December.  Given this recurrent activity, he was treated a status epilepticus 4 mg IV Ativan and loaded with Keppra 4.5 g(60 mg/kg).  Neurology has been consulted for further management recommendations.  ROS:  Unable to obtain due to altered mental status.   Past Medical History:  Diagnosis Date  . Acute deep vein thrombosis (DVT) of left upper extremity (HCC)    L brachial and basilic veins, eliquis started 08/22/2017 to continue for 3 months total   . Cognitive impairment 2007   after stroke, saw rehab but told to stop because was too upsetting to him  . History of chicken pox   . HLD (hyperlipidemia)   . HTN (hypertension)   . NSTEMI (non-ST elevated myocardial infarction) (HCC) 02/21/2017  . Obesity   . Stroke, hemorrhagic (HCC) 2007   thought 2/2 HTN (240sbp); residual cognitive impairment, loss of R peripheral field, no driving     Family History  Problem Relation Age of Onset  . Alzheimer's disease Maternal Grandfather   . Cancer Mother        lymphoma  . Alcohol abuse Father        smoker  . Coronary artery disease Neg Hx   . Stroke Neg Hx   . Diabetes Neg Hx      Social History:  reports that he has never smoked. He has quit using smokeless tobacco. He reports previous alcohol use. He reports that he does not use drugs.  Exam: Current vital signs: BP 129/78   Pulse 98   Temp 99.1 F (37.3 C) (Oral)   Resp 15   SpO2 96%  Vital signs in last 24 hours: Temp:  [99.1 F (37.3 C)]  99.1 F (37.3 C) (05/12 1959) Pulse Rate:  [97-129] 98 (05/12 2205) Resp:  [13-24] 15 (05/12 2205) BP: (118-192)/(78-107) 129/78 (05/12 2205) SpO2:  [92 %-97 %] 96 % (05/12 2205)   Physical Exam  Constitutional: Appears well-developed and well-nourished.  Psych: Affect appropriate to situation Eyes: No scleral injection HENT: No OP obstruction MSK: no joint deformities.  Cardiovascular: Normal rate and regular rhythm.  Respiratory: Effort normal, non-labored breathing GI: Soft.  No distension. There is no tenderness.  Skin: WDI  Neuro: Mental Status: Patient is obtunded but opens eyes with noxious stimulation.  Cranial Nerves: II: Visual Fields are full. L pupil is slightly larger than right, both are reactive.  III,IV, VI: at times midline, but mostly deviated to the left.  VII: Facial movement with left facial weakness.  Blinks to eyelid stimulation bilaterally.  Motor: He flexes to noxious stimulation in the left arm and leg. He has voluntary movements of the RUE, and localizes ipsilateral noxious stimulation.  Sensory: Responds to nox stim x 4.  Cerebellar: Does not perform.   He has some non-ictal appearing tremor in the RUE that abates with repositioning.   I have reviewed labs in epic and the results pertinent to this consultation are: VPA 49 Cr 0.67 Na 132 CBC - normal.   I  have reviewed the images obtained: CT,CTA - negative for acute findings, stable left encephalomalacia and enlarged ventricles.   Impression: 65 year old male with a history of seizures with breakthrough seizures.  Given the intermittent eye deviation and lack of return to baseline, this would be categorized as status epilepticus.  This aborted with a 4.5 g Keppra load after he was continuing to have intermittent eye deviation following 4 mg of IV Ativan.  No clear inciting events by history laboratory evaluation.  His Depakote level, therefore he has room to increase this, with his Vimpat and  Keppra are towards the top of their dosing range.   Due to his poor exam, I called in a stat EEG was obtained which on my review demonstrates no ongoing ictal activity.  Recommendations: 1) continue Keppra 1500 twice daily IV 2) continue vimpat 200mg  BID IV 3) increase Depakote to 500 mg 3 times daily IV, this can be continued as 750 twice daily once p.o. intake is possible 4) overnight EEG  This patient is critically ill and at significant risk of neurological worsening, death and care requires constant monitoring of vital signs, hemodynamics,respiratory and cardiac monitoring, neurological assessment, discussion with family, other specialists and medical decision making of high complexity. I spent 50 minutes of neurocritical care time  in the care of  this patient. This was time spent independent of any time provided by nurse practitioner or PA.  , MD [facility] [phone]  If 7pm- 7am, please page neurology on call as listed in AMION. 05/25/2020  11:25 PM

## 2020-05-25 NOTE — ED Provider Notes (Signed)
[facility]   CSN: [id number] Arrival date & time: 05/25/20  1955     History Chief Complaint  Patient presents with  . Altered Mental Status  . Seizures    [name] MileBilly J Cruz is a 65 y.o. male.  HPI 65 year old male with history of stroke, hypertension, hyperlipidemia, DVT, and CAD presents emergency department for altered mental status.  Upon arrival, patient has left gaze deviation.  His not responsive to pain, does have eyes open.  Unable to contribute to history and review of systems.  Per EMS, patient had episode of altered mental status prior to arrival.  This resolved in the ambulance.  They state that patient was disoriented, but at baseline per family.  Requesting his mom.  Had no complaints at that time.  When he got to the ED, they stated he started being unresponsive with left gaze deviation.  Jaw stiffening.  He has not gotten anything for the symptoms.  Nothing is made the better, nothing exam worse.  They do report history of seizure activity, uncertain what seizures look like.  Has been present for about 5 minutes upon my arrival.  I spoke with patient's sister, Steward DroneBrenda.  She states that he lives with her.  States he was in his normal state of health until today.  Denies any change in medications.  States they have been giving him his medicine as prescribed.  Last seizure was in December, secondary to not being able to get his medication.  No sick contacts.  No recent trauma.  States that at home, he had right gaze deviation, she thought it might be a seizure.  Did not give anything for the possible seizure.  She does state that patient has left-sided weakness at baseline, is barely able to move left upper or lower extremity if at all.    Past Medical History:  Diagnosis Date  . Acute deep vein thrombosis (DVT) of left upper extremity (HCC)    L brachial and basilic veins, eliquis started 08/22/2017 to continue for 3 months  total   . Cognitive impairment 2007   after stroke, saw rehab but told to stop because was too upsetting to him  . History of chicken pox   . HLD (hyperlipidemia)   . HTN (hypertension)   . NSTEMI (non-ST elevated myocardial infarction) (HCC) 02/21/2017  . Obesity   . Stroke, hemorrhagic (HCC) 2007   thought 2/2 HTN (240sbp); residual cognitive impairment, loss of R peripheral field, no driving    Patient Active Problem List   Diagnosis Date Noted  . Cough 03/16/2020  . Bowel and bladder incontinence 10/22/2019  . SAH (subarachnoid hemorrhage) (HCC)   . Neurogenic bladder   . Hemiparesis affecting left side as late effect of stroke (HCC)   . Thalamic hemorrhage (HCC) 07/26/2019  . History of DVT in adulthood 02/21/2019  . PAD (peripheral artery disease) (HCC) 12/14/2018  . Agitation 09/04/2018  . Acute CVA (cerebrovascular accident) (HCC) 08/07/2018  . Cerebellar stroke, acute (HCC) 07/10/2018  . Status epilepticus (HCC) 03/25/2018  . Unilateral occipital headache 10/03/2017  . Ischemic stroke (HCC) 08/23/2017  . Coronary artery disease involving coronary bypass graft of native heart without angina pectoris   . Atrial fibrillation (HCC)   . Dysphagia, post-stroke   . Cardiac arrest (HCC) 08/09/2017  . S/P CABG x 3 02/24/2017  . NSTEMI (non-ST elevated myocardial infarction) (HCC) 02/21/2017  . Encephalomalacia 10/28/2012  . Seizure disorder (HCC) 04/26/2012  . Alcohol  abuse 04/26/2012  . History of stroke with current residual effects   . Cognitive impairment   . HLD (hyperlipidemia)   . HTN (hypertension)     Past Surgical History:  Procedure Laterality Date  . ANKLE SURGERY  1990s   right foot with plate and screws  . CORONARY ARTERY BYPASS GRAFT N/A 02/24/2017   3v Procedure: CORONARY ARTERY BYPASS GRAFTING (CABG) x 3 ON PUMP USING LEFT INTERNAL MAMMARY ARTERY TO LEFT ANTERIOR DESENDING CORNARY ARTERY, RIGHT GREATER SAPHENOUS VEIN TO LEFT CIRCUMFLEX ARTERY AND  POSTERIOR DESENDING ARTERY. RIGHT GREATER SAPHENOUS VEIN OBTAINED VIA ENDOVEIN HARVEST.;  Surgeon: Delight Ovens, MD  . IABP INSERTION N/A 02/21/2017   Procedure: IABP Insertion;  Surgeon: Yvonne , MD;  Location: [facility] INVASIVE CV LAB;  Service: Cardiovascular;  Laterality: N/A;  . LEFT HEART CATH AND CORONARY ANGIOGRAPHY N/A 02/21/2017   Procedure: LEFT HEART CATH AND CORONARY ANGIOGRAPHY;  Surgeon: Yvonne , MD;  Location: [facility] INVASIVE CV LAB;  Service: Cardiovascular;  Laterality: N/A;  . TEE WITHOUT CARDIOVERSION N/A 02/24/2017   Procedure: TRANSESOPHAGEAL ECHOCARDIOGRAM (TEE);  Surgeon: Delight Ovens, MD;  Location: [facility] OR;  Service: Open Heart Surgery;  Laterality: N/A;       Family History  Problem Relation Age of Onset  . Alzheimer's disease Maternal Grandfather   . Cancer Mother        lymphoma  . Alcohol abuse Father        smoker  . Coronary artery disease Neg Hx   . Stroke Neg Hx   . Diabetes Neg Hx     Social History   Tobacco Use  . Smoking status: Never Smoker  . Smokeless tobacco: Former Clinical biochemist  . Vaping Use: Never used  Substance Use Topics  . Alcohol use: Not Currently  . Drug use: No    Home Medications Prior to Admission medications   Medication Sig Start Date End Date Taking? Authorizing Provider  aspirin EC 81 MG tablet Take 1 tablet (81 mg total) by mouth daily. Swallow whole. 03/06/20  Yes McCue, Shanda Bumps, NP  atorvastatin (LIPITOR) 80 MG tablet TAKE 1 TABLET BY MOUTH EVERY DAY Patient taking differently: Take 80 mg by mouth daily. 05/17/20  Yes [name], MD  Cyanocobalamin (VITAMIN B-12) 1000 MCG SUBL Place 1,000 mcg under the tongue daily.   Yes [provider]  divalproex (DEPAKOTE) 500 MG DR tablet Take 1 tablet (500 mg total) by mouth 2 (two) times daily. 03/06/20  Yes McCue, Shanda Bumps, NP  lacosamide (VIMPAT) 200 MG TABS tablet 1 tablet at 8 am and 1 tablet at 10 pm. Patient taking differently: Take 200 mg  by mouth 2 (two) times daily. 03/06/20  Yes McCue, Shanda Bumps, NP  levETIRAcetam (KEPPRA) 750 MG tablet Take 2 tablets (1,500 mg total) by mouth 2 (two) times daily. 03/06/20  Yes Ihor Austin, NP  Multiple Vitamin (MULTIVITAMIN WITH MINERALS) TABS tablet Take 1 tablet by mouth daily. 12/26/17  Yes [name], MD  sertraline (ZOLOFT) 25 MG tablet TAKE 1 TABLET BY MOUTH EVERY DAY Patient taking differently: Take 25 mg by mouth daily. 05/24/20  Yes [name], MD  ziprasidone (GEODON) 20 MG capsule Take 1 capsule (20 mg total) by mouth at bedtime. 03/13/20  Yes [name], MD  mupirocin cream (BACTROBAN) 2 % Apply 1 application topically 2 (two) times daily. Patient not taking: No sig reported 04/18/20   [name], MD    Allergies    Losartan  Review of  Systems   Review of Systems  Unable to perform ROS: Mental status change    Physical Exam Updated Vital Signs BP 108/77   Pulse 88   Temp 99.1 F (37.3 C) (Oral)   Resp 15   SpO2 96%   Physical Exam Vitals and nursing note reviewed.  Constitutional:      Appearance: He is well-developed.     Comments: Eyes open, left gaze deviation  HENT:     Head: Normocephalic and atraumatic.     Comments: Jaw tense, unable to open mouth with moderate traction    Right Ear: External ear normal.     Left Ear: External ear normal.     Nose: Nose normal.     Mouth/Throat:     Mouth: Mucous membranes are dry.  Eyes:     Conjunctiva/sclera: Conjunctivae normal.     Pupils: Pupils are equal, round, and reactive to light.     Comments: Left gaze deviation with bouncing eye movements laterally  Cardiovascular:     Rate and Rhythm: Regular rhythm. Tachycardia present.     Heart sounds: Normal heart sounds. No murmur heard.   Pulmonary:     Effort: Pulmonary effort is normal. No respiratory distress.     Breath sounds: Normal breath sounds.  Abdominal:     Palpations: Abdomen is soft.     Tenderness: There is no  guarding.  Musculoskeletal:     Cervical back: Neck supple.     Right lower leg: No edema.     Left lower leg: No edema.  Skin:    General: Skin is warm and dry.     Capillary Refill: Capillary refill takes less than 2 seconds.  Neurological:     GCS: GCS eye subscore is 4. GCS verbal subscore is 1. GCS motor subscore is 1.     Comments: Hypertonicity to left upper and lower extremity.  No spontaneous movement in all extremities.  Unable to cooperate with physical exam.  Psychiatric:     Comments: Deferred due to mental status change      ED Results / Procedures / Treatments   Labs (all labs ordered are listed, but only abnormal results are displayed) Labs Reviewed  BASIC METABOLIC PANEL - Abnormal; Notable for the following components:      Result Value   Sodium 132 (*)    Glucose, Bld 111 (*)    Calcium 8.7 (*)    All other components within normal limits  VALPROIC ACID LEVEL - Abnormal; Notable for the following components:   Valproic Acid Lvl 49 (*)    All other components within normal limits  CBG MONITORING, ED - Abnormal; Notable for the following components:   Glucose-Capillary 102 (*)    All other components within normal limits  RESP PANEL BY RT-PCR (FLU A&B, COVID) ARPGX2  CBC WITH DIFFERENTIAL/PLATELET  MAGNESIUM  URINALYSIS, ROUTINE W REFLEX MICROSCOPIC  LEVETIRACETAM LEVEL  ALBUMIN    EKG None  Radiology CT ANGIO HEAD NECK W WO CM  Result Date: 05/25/2020 CLINICAL DATA:  Initial evaluation for focal neural deficit, stroke suspected. EXAM: CT ANGIOGRAPHY HEAD AND NECK TECHNIQUE: Multidetector CT imaging of the head and neck was performed using the standard protocol during bolus administration of intravenous contrast. Multiplanar CT image reconstructions and MIPs were obtained to evaluate the vascular anatomy. Carotid stenosis measurements (when applicable) are obtained utilizing NASCET criteria, using the distal internal carotid diameter as the denominator.  CONTRAST:  31mL OMNIPAQUE IOHEXOL 350 MG/ML SOLN  COMPARISON:  Prior CT from 07/19/2019 and MRA from 07/20/2019. FINDINGS: CT HEAD FINDINGS Brain: Generalized age-related cerebral atrophy with chronic microvascular ischemic disease. Remote lacunar infarct at the right basal ganglia with associated wallerian degeneration. Chronic encephalomalacia of with dystrophic calcification involving the left temporoparietal region again noted. Associated ex vacuo dilatation of the left lateral ventricle. No acute intracranial hemorrhage. No acute large vessel territory infarct. No mass lesion, mass effect or midline shift. No extra-axial fluid collection. Ventricles stable in size without hydrocephalus. Vascular: No hyperdense vessel. Scattered vascular calcifications noted within the carotid siphons. Skull: Scalp soft tissues and calvarium within normal limits. Sinuses: Mild scattered mucosal thickening noted within the ethmoidal air cells. Paranasal sinuses are otherwise largely clear. Trace left mastoid effusion noted, no full significance. Orbits: Left gaze noted. Globes and orbital soft tissues demonstrate no other acute finding. Review of the MIP images confirms the above findings CTA NECK FINDINGS Aortic arch: Visualized aortic arch normal caliber with normal branch pattern. Mild atheromatous change about the aortic arch and origin of the great vessels without hemodynamically significant stenosis. Right carotid system: Right CCA patent from its origin to the bifurcation without stenosis. Bulky calcified plaque about the right carotid bulb/proximal right ICA with associated stenosis of up to 50% by NASCET criteria. Right ICA patent distally without stenosis, dissection or occlusion. Left carotid system: Left CCA patent from its origin to the bifurcation without stenosis. Bulky calcified plaque about the left carotid bulb with no more than mild 25% stenosis by NASCET criteria. Left ICA patent distally without stenosis,  dissection or occlusion. Vertebral arteries: Left vertebral artery arises directly from the aortic arch. Calcified plaque at the origins of both vertebral arteries with estimated moderate approximate 50% stenoses bilaterally. Vertebral arteries mildly tortuous but otherwise widely patent within the neck without stenosis, dissection or occlusion. Skeleton: No visible acute osseous finding. No discrete or worrisome osseous lesions. Other neck: No other acute soft tissue abnormality within the neck. No mass or adenopathy. Upper chest: Patchy and hazy opacity seen within the partially visualized right upper lobe, which could reflect atelectasis and/or infiltrate. Layering secretions noted within the subglottic trachea at the level of the carina with extension into the proximal right mainstem bronchus. Visualized upper chest demonstrates no other acute finding. Review of the MIP images confirms the above findings CTA HEAD FINDINGS Anterior circulation: Petrous segments patent bilaterally. Scattered atheromatous change within the carotid siphons bilaterally, left worse than right. Associated moderate stenosis at the supraclinoid left ICA. A1 segments patent bilaterally. Right A1 is partially fenestrated distally. Normal anterior communicating artery complex. Anterior cerebral arteries patent to their distal aspects without stenosis. No M1 stenosis or occlusion. Normal MCA bifurcations. Distal MCA branches well perfused and symmetric. Posterior circulation: Focal calcified plaque noted within the proximal left V4 segment without significant stenosis. Vertebral arteries otherwise widely patent to the vertebrobasilar junction. Left PICA origin patent and normal. Right PICA not seen. Basilar patent to its distal aspect without stenosis. Superior cerebral arteries patent bilaterally. Left PCA supplied via the basilar. Fetal type origin of the right PCA. Both PCAs well perfused or distal aspects. Venous sinuses: Grossly patent  allowing for timing the contrast bolus. Anatomic variants: Fetal type origin of the right PCA.  No aneurysm. Review of the MIP images confirms the above findings IMPRESSION: CT HEAD IMPRESSION: 1. No acute intracranial abnormality. 2. Chronic left temporoparietal and right basal ganglia infarcts. 3. Underlying atrophy with chronic microvascular ischemic disease. CTA HEAD AND NECK IMPRESSION: 1. Negative  CTA for large vessel occlusion. 2. Atheromatous change about the carotid bifurcations with up to 50% stenosis on the right. 3. Moderate atheromatous stenosis at the supraclinoid left ICA. 4. Moderate estimated 50% stenoses at the origins of both vertebral arteries. 5. Hazy and patchy opacity within the posterior right upper lobe, which could reflect atelectasis or infiltrate. Layering secretions/debris seen within the subglottic trachea and right mainstem bronchus, raising the possibility for aspiration. CT PERFUSION IMPRESSION: Electronically Signed   By: Rise Mu M.D.   On: 05/25/2020 22:14   DG Chest Portable 1 View  Result Date: 05/25/2020 CLINICAL DATA:  65 year old male with cough. EXAM: PORTABLE CHEST 1 VIEW COMPARISON:  Chest radiograph dated 10/09/2019. FINDINGS: No focal consolidation, pleural effusion, pneumothorax. Stable cardiomegaly. Median sternotomy wires and CABG vascular clips. No acute osseous pathology. IMPRESSION: No active cardiopulmonary disease. Electronically Signed   By: Elgie Collard M.D.   On: 05/25/2020 20:44    Procedures Procedures   Medications Ordered in ED Medications  valproate (DEPACON) 500 mg in dextrose 5 % 50 mL IVPB (has no administration in time range)  levETIRAcetam (KEPPRA) IVPB 1500 mg/ 100 mL premix (has no administration in time range)  lacosamide (VIMPAT) 200 mg in sodium chloride 0.9 % 25 mL IVPB (has no administration in time range)  LORazepam (ATIVAN) injection 2 mg (2 mg Intravenous Given 05/25/20 1958)  LORazepam (ATIVAN) injection 2 mg  (2 mg Intravenous Given 05/25/20 2005)  levETIRAcetam (KEPPRA) IVPB 1000 mg/100 mL premix (0 mg Intravenous Stopped 05/25/20 2103)  levETIRAcetam (KEPPRA) IVPB 1500 mg/ 100 mL premix (0 mg Intravenous Stopped 05/25/20 2147)    Followed by  levETIRAcetam (KEPPRA) IVPB 500 mg/100 mL premix (0 mg Intravenous Stopped 05/25/20 2226)  iohexol (OMNIPAQUE) 350 MG/ML injection 50 mL (50 mLs Intravenous Contrast Given 05/25/20 2132)    ED Course  I have reviewed the triage vital signs and the nursing notes.  Pertinent labs & imaging results that were available during my care of the patient were reviewed by me and considered in my medical decision making (see chart for details).    MDM Rules/Calculators/A&P                          65 year old male with history of seizures, stroke, hypertension, and hyperlipidemia presenting for altered mental status.  Upon arrival, vital signs are stable.  He is significantly altered.  Is tachycardic with jaw rigidity.  This is concerning for seizure activity.  Has been going on for about 5 minutes per EMS.  Blood sugar over 100.  2 mg of Ativan given.  Did appear to help symptoms, but that they did not completely abate.  Still has left-sided gaze deviation is not responding with outside stimuli.  Additional 2 mg IV Ativan given.  Patient continued to have what appeared to be seizure-like activity.  Additional 4 mg IV Ativan ordered.  3.5 g IV Keppra load ordered for possible status.  Neurology was emergently consulted.  They very quickly came to evaluate patient in the emergency department, recommended holding 4 mg IV Ativan until they were able to evaluate the patient as they were less than 2 minutes away.  This was done.  Biggest concern initially is possible status epilepticus.  Could also be atypical presentation of stroke versus intracranial hemorrhage.  If status, uncertain precipitant.  Sister does report compliance with medication and no recent illness.  Will evaluate  with lab work and chest x-ray.  Neurology  recommended stroke protocol imaging.  X-ray chest showed no acute findings.  Lab shows unremarkable CBC and BMP.  Magnesium normal.  Depakote level subtherapeutic.    Head and neck with no acute intracranial abnormality.  This was also reviewed by myself and neurology.  EEG after Keppra was given, showed resolution of seizure activity.  No seizures on EEG currently.  Dr. Kirkpatrick, neurologist, [facility] admission for further monitoring and treatment.  Patient admitted to hospitalist service, neurology will continue to follow.  Final Clinical Impression(s) / ED Diagnoses Final diagnoses:  Transient alteration of awareness  Seizures [facility])    Rx / DC Orders ED Discharge Orders    None       Louretta Parma, DO 05/25/20 2328    Linwood Dibbles, MD 06/01/20 [phone]

## 2020-05-25 NOTE — Progress Notes (Signed)
EEG complete - results pending.   notified kirkpatrick through text pt is running. Informed RN how to unplug and replug pt on EEG

## 2020-05-25 NOTE — ED Notes (Signed)
Dr Amada Jupiter wants to hold off on additional ativan

## 2020-05-25 NOTE — ED Triage Notes (Signed)
Hx of previous stroke with left sided facial droop and left sided paralysis.  Paitent typically is oriented to person place situation but not time.  Patient upon arrival unable to look right and twitching.  Pt has hx of seizure disorder.  Family said he was not acting normal nor talking and called 911.

## 2020-05-25 NOTE — ED Notes (Signed)
Called CT and was told until his creat comes back they are waiting on CT

## 2020-05-25 NOTE — ED Notes (Signed)
CT notified of patients creat being back

## 2020-05-26 ENCOUNTER — Encounter (HOSPITAL_COMMUNITY): Payer: Self-pay | Admitting: Internal Medicine

## 2020-05-26 DIAGNOSIS — Z951 Presence of aortocoronary bypass graft: Secondary | ICD-10-CM | POA: Diagnosis not present

## 2020-05-26 DIAGNOSIS — D649 Anemia, unspecified: Secondary | ICD-10-CM | POA: Diagnosis not present

## 2020-05-26 DIAGNOSIS — I10 Essential (primary) hypertension: Secondary | ICD-10-CM | POA: Diagnosis present

## 2020-05-26 DIAGNOSIS — R404 Transient alteration of awareness: Secondary | ICD-10-CM | POA: Diagnosis present

## 2020-05-26 DIAGNOSIS — J690 Pneumonitis due to inhalation of food and vomit: Secondary | ICD-10-CM | POA: Diagnosis present

## 2020-05-26 DIAGNOSIS — R4189 Other symptoms and signs involving cognitive functions and awareness: Secondary | ICD-10-CM | POA: Diagnosis present

## 2020-05-26 DIAGNOSIS — M24542 Contracture, left hand: Secondary | ICD-10-CM | POA: Diagnosis present

## 2020-05-26 DIAGNOSIS — E669 Obesity, unspecified: Secondary | ICD-10-CM | POA: Diagnosis present

## 2020-05-26 DIAGNOSIS — E876 Hypokalemia: Secondary | ICD-10-CM | POA: Diagnosis not present

## 2020-05-26 DIAGNOSIS — Z79899 Other long term (current) drug therapy: Secondary | ICD-10-CM | POA: Diagnosis not present

## 2020-05-26 DIAGNOSIS — Z20822 Contact with and (suspected) exposure to covid-19: Secondary | ICD-10-CM | POA: Diagnosis present

## 2020-05-26 DIAGNOSIS — E871 Hypo-osmolality and hyponatremia: Secondary | ICD-10-CM | POA: Diagnosis present

## 2020-05-26 DIAGNOSIS — E785 Hyperlipidemia, unspecified: Secondary | ICD-10-CM | POA: Diagnosis present

## 2020-05-26 DIAGNOSIS — Z6830 Body mass index (BMI) 30.0-30.9, adult: Secondary | ICD-10-CM | POA: Diagnosis not present

## 2020-05-26 DIAGNOSIS — H5461 Unqualified visual loss, right eye, normal vision left eye: Secondary | ICD-10-CM | POA: Diagnosis present

## 2020-05-26 DIAGNOSIS — I2510 Atherosclerotic heart disease of native coronary artery without angina pectoris: Secondary | ICD-10-CM | POA: Diagnosis present

## 2020-05-26 DIAGNOSIS — I69354 Hemiplegia and hemiparesis following cerebral infarction affecting left non-dominant side: Secondary | ICD-10-CM | POA: Diagnosis not present

## 2020-05-26 DIAGNOSIS — I4891 Unspecified atrial fibrillation: Secondary | ICD-10-CM | POA: Diagnosis present

## 2020-05-26 DIAGNOSIS — I252 Old myocardial infarction: Secondary | ICD-10-CM | POA: Diagnosis not present

## 2020-05-26 DIAGNOSIS — Z7982 Long term (current) use of aspirin: Secondary | ICD-10-CM | POA: Diagnosis not present

## 2020-05-26 DIAGNOSIS — I69319 Unspecified symptoms and signs involving cognitive functions following cerebral infarction: Secondary | ICD-10-CM | POA: Diagnosis not present

## 2020-05-26 DIAGNOSIS — I69398 Other sequelae of cerebral infarction: Secondary | ICD-10-CM | POA: Diagnosis not present

## 2020-05-26 DIAGNOSIS — G40901 Epilepsy, unspecified, not intractable, with status epilepticus: Secondary | ICD-10-CM | POA: Diagnosis present

## 2020-05-26 DIAGNOSIS — Z86718 Personal history of other venous thrombosis and embolism: Secondary | ICD-10-CM | POA: Diagnosis not present

## 2020-05-26 LAB — COMPREHENSIVE METABOLIC PANEL
ALT: 22 U/L (ref 0–44)
AST: 21 U/L (ref 15–41)
Albumin: 3.3 g/dL — ABNORMAL LOW (ref 3.5–5.0)
Alkaline Phosphatase: 45 U/L (ref 38–126)
Anion gap: 9 (ref 5–15)
BUN: 8 mg/dL (ref 8–23)
CO2: 25 mmol/L (ref 22–32)
Calcium: 8.7 mg/dL — ABNORMAL LOW (ref 8.9–10.3)
Chloride: 98 mmol/L (ref 98–111)
Creatinine, Ser: 0.55 mg/dL — ABNORMAL LOW (ref 0.61–1.24)
GFR, Estimated: >60 mL/min (ref >60)
Glucose, Bld: 112 mg/dL — ABNORMAL HIGH (ref 70–99)
Potassium: 3.9 mmol/L (ref 3.5–5.1)
Sodium: 132 mmol/L — ABNORMAL LOW (ref 135–145)
Total Bilirubin: 0.6 mg/dL (ref 0.3–1.2)
Total Protein: 5.8 g/dL — ABNORMAL LOW (ref 6.5–8.1)

## 2020-05-26 LAB — GLUCOSE, CAPILLARY
Glucose-Capillary: 89 mg/dL (ref 70–99)
Glucose-Capillary: 96 mg/dL (ref 70–99)
Glucose-Capillary: 109 mg/dL — ABNORMAL HIGH (ref 70–99)
Glucose-Capillary: 108 mg/dL — ABNORMAL HIGH (ref 70–99)

## 2020-05-26 LAB — CBC
HCT: 36.9 % — ABNORMAL LOW (ref 39.0–52.0)
Hemoglobin: 12.9 g/dL — ABNORMAL LOW (ref 13.0–17.0)
MCH: 32.5 pg (ref 26.0–34.0)
MCHC: 35 g/dL (ref 30.0–36.0)
MCV: 92.9 fL (ref 80.0–100.0)
Platelets: 211 10*3/uL (ref 150–400)
RBC: 3.97 MIL/uL — ABNORMAL LOW (ref 4.22–5.81)
RDW: 12.8 % (ref 11.5–15.5)
WBC: 11.4 10*3/uL — ABNORMAL HIGH (ref 4.0–10.5)
nRBC: 0 % (ref 0.0–0.2)

## 2020-05-26 LAB — URINALYSIS, ROUTINE W REFLEX MICROSCOPIC
Bacteria, UA: NONE SEEN
Bilirubin Urine: NEGATIVE
Glucose, UA: NEGATIVE mg/dL
Hgb urine dipstick: NEGATIVE
Ketones, ur: NEGATIVE mg/dL
Leukocytes,Ua: NEGATIVE
Nitrite: NEGATIVE
Protein, ur: NEGATIVE mg/dL
Specific Gravity, Urine: 1.012 (ref 1.005–1.030)
pH: 7 (ref 5.0–8.0)

## 2020-05-26 LAB — MAGNESIUM: Magnesium: 1.6 mg/dL — ABNORMAL LOW (ref 1.7–2.4)

## 2020-05-26 LAB — CBG MONITORING, ED: Glucose-Capillary: 113 mg/dL — ABNORMAL HIGH (ref 70–99)

## 2020-05-26 LAB — ALBUMIN: Albumin: 3.4 g/dL — ABNORMAL LOW (ref 3.5–5.0)

## 2020-05-26 MED ORDER — MAGNESIUM SULFATE 2 GM/50ML IV SOLN
2.0000 g | Freq: Once | INTRAVENOUS | Status: AC
  Administered 2020-05-26: 2 g via INTRAVENOUS
  Filled 2020-05-26: qty 50

## 2020-05-26 MED ORDER — ACETAMINOPHEN 650 MG RE SUPP: 650.0000 mg | Freq: Four times a day (QID) | RECTAL | Status: DC | PRN

## 2020-05-26 MED ORDER — SODIUM CHLORIDE 0.9 % IV SOLN
3.0000 g | Freq: Four times a day (QID) | INTRAVENOUS | Status: AC
  Administered 2020-05-26 – 2020-05-31 (×20): 3 g via INTRAVENOUS
  Filled 2020-05-26: qty 8
  Filled 2020-05-26: qty 3
  Filled 2020-05-26: qty 8
  Filled 2020-05-26: qty 3
  Filled 2020-05-26 (×2): qty 8
  Filled 2020-05-26: qty 3
  Filled 2020-05-26 (×4): qty 8
  Filled 2020-05-26: qty 3
  Filled 2020-05-26 (×2): qty 8
  Filled 2020-05-26 (×2): qty 3
  Filled 2020-05-26 (×4): qty 8
  Filled 2020-05-26: qty 3

## 2020-05-26 MED ORDER — LEVETIRACETAM IN NACL 1000 MG/100ML IV SOLN
1000.0000 mg | INTRAVENOUS | Status: DC
  Filled 2020-05-26: qty 100

## 2020-05-26 MED ORDER — ENOXAPARIN SODIUM 40 MG/0.4ML IJ SOSY
40.0000 mg | PREFILLED_SYRINGE | INTRAMUSCULAR | Status: DC
  Administered 2020-05-26 – 2020-05-31 (×6): 40 mg via SUBCUTANEOUS
  Filled 2020-05-26 (×6): qty 0.4

## 2020-05-26 MED ORDER — SODIUM CHLORIDE 0.45 % IV SOLN: INTRAVENOUS | Status: DC

## 2020-05-26 MED ORDER — ACETAMINOPHEN 325 MG PO TABS: 650.0000 mg | ORAL_TABLET | Freq: Four times a day (QID) | ORAL | Status: DC | PRN

## 2020-05-26 NOTE — Progress Notes (Addendum)
[facility] PROGRESS NOTE   [name] Cruz YBO:[id number] DOB: 05/25/1955 DOA: 05/25/2020  PCP: [name], MD  Brief History/Interval Summary: 65 y.o. male with known history of stroke, seizures was found to have abnormal movement and confusion at home and was brought to the ER.  In the ER patient started developing abnormal movement in the left upper extremity and left-sided gaze.  Apparently was in status epilepticus for about 20 minutes. CT angiogram of the head and neck was done which did not show any large vessel obstruction but did show some signs of aspiration.  Patient was given 4 mg IV Ativan and Keppra was loaded per weight.  Neurologist at this time had EEG done and patient was started on IV Keppra Vimpat and valproate.  Valproic acid levels were 49 subtherapeutic.    Patient was hospitalized for further management.  Reason for Visit: Status epilepticus  Consultants: Neurology  Procedures: EEG  Antibiotics: Anti-infectives (From admission, onward)   Start     Dose/Rate Route Frequency Ordered Stop   05/26/20 0600  Ampicillin-Sulbactam (UNASYN) 3 g in sodium chloride 0.9 % 100 mL IVPB        3 g 200 mL/hr over 30 Minutes Intravenous Every 6 hours 05/26/20 0539        Subjective/Interval History: Patient noted to be significantly postictal.  Does open his eyes on calling his name but does not respond.  Noted to be moving all of his extremities.     Assessment/Plan:  Status epilepticus in the setting of known seizure disorder Patient was given 4 mg of Ativan and Keppra loading dose.  He appears to have stopped seizing.  Is currently on long-term monitoring.  Neurology is following.  Patient still significantly postictal. Patient currently on Vimpat, Keppra and Depakote.  All of these have been given intravenously.  Aspiration pneumonia Crackles heard on examination.  No pneumonia noted on chest x-ray.  Likely due to seizure activity.  Currently on Unasyn.   Noted to be afebrile.  WBC 11.4.  Requiring 2 L of oxygen by nasal cannula.  Speech therapy to see when he is more awake.  Hypomagnesemia To be repleted.  History of stroke Stable.  Does not appear to have had a new event.  Neurological deficits due to seizure activity.  Patient was on aspirin and atorvastatin prior to admission.  These can be resumed when the patient is able to take orally.  Obesity Estimated body mass index is 30.58 kg/m as calculated from the following:   Height as of 03/13/20: 5\' 3"  (1.6 m).   Weight as of 10/09/19: 78.3 kg.   DVT Prophylaxis: Lovenox Code Status: Full code Family Communication: No family at bedside Disposition Plan: To be determined  Status is: Observation  The patient will require care spanning > 2 midnights and should be moved to inpatient because: Altered mental status, IV treatments appropriate due to intensity of illness or inability to take PO and Inpatient level of care appropriate due to severity of illness  Dispo: The patient is from: Home              Anticipated d/c is to: To be determined              Patient currently is not medically stable to d/c.   Difficult to place patient No      Medications:  Scheduled: . enoxaparin (LOVENOX) injection  40 mg Subcutaneous Q24H   Continuous: . ampicillin-sulbactam (UNASYN) IV Stopped (05/26/20 0720)  .  lacosamide (VIMPAT) IV Stopped (05/26/20 0043)  . levETIRAcetam    . valproate sodium Stopped (05/26/20 0100)   GMW:NUUVOZDGUYQIH **OR** acetaminophen   Objective:  Vital Signs  Vitals:   05/26/20 0615 05/26/20 0630 05/26/20 0645 05/26/20 0730  BP: 109/62 (!) 97/57 (!) 96/57 127/85  Pulse: 91 83 83 83  Resp:      Temp:      TempSrc:      SpO2: (!) 88% 93% 92% 95%    Intake/Output Summary (Last 24 hours) at 05/26/2020 4742 Last data filed at 05/26/2020 0720 Gross per 24 hour  Intake 781.67 ml  Output --  Net 781.67 ml   There were no vitals filed for this  visit.  General appearance: Postictal.  Opens his eyes but does not respond to questions. Resp: Normal effort.  Few crackles right lung.  No wheezing. Cardio: S1-S2 is normal regular.  No S3-S4.  No rubs murmurs or bruit GI: Abdomen is soft.  Nontender nondistended.  Bowel sounds are present normal.  No masses organomegaly Extremities: No edema.  Noted to be moving his extremities. Neurologic: Significantly postictal.  No obvious facial asymmetry.   Lab Results:  Data Reviewed: I have personally reviewed following labs and imaging studies  CBC: Recent Labs  Lab 05/25/20 2012 05/26/20 0225  WBC 9.7 11.4*  NEUTROABS 5.3  --   HGB 14.0 12.9*  HCT 41.5 36.9*  MCV 95.2 92.9  PLT 228 211    Basic Metabolic Panel: Recent Labs  Lab 05/25/20 2012 05/26/20 0225  NA 132* 132*  K 4.4 3.9  CL 98 98  CO2 27 25  GLUCOSE 111* 112*  BUN 10 8  CREATININE 0.67 0.55*  CALCIUM 8.7* 8.7*  MG 1.9 1.6*    GFR: CrCl cannot be calculated (Unknown ideal weight.).  Liver Function Tests: Recent Labs  Lab 05/25/20 2355 05/26/20 0225  AST  --  21  ALT  --  22  ALKPHOS  --  45  BILITOT  --  0.6  PROT  --  5.8*  ALBUMIN 3.4* 3.3*    CBG: Recent Labs  Lab 05/25/20 2015 05/26/20 0457  GLUCAP 102* 113*     Recent Results (from the past 240 hour(s))  Resp Panel by RT-PCR (Flu A&B, Covid) Nasopharyngeal Swab     Status: None   Collection Time: 05/25/20  8:12 PM   Specimen: Nasopharyngeal Swab; Nasopharyngeal(NP) swabs in vial transport medium  Result Value Ref Range Status   SARS Coronavirus 2 by RT PCR NEGATIVE NEGATIVE Final    Comment: (NOTE) SARS-CoV-2 target nucleic acids are NOT DETECTED.  The SARS-CoV-2 RNA is generally detectable in upper respiratory specimens during the acute phase of infection. The lowest concentration of SARS-CoV-2 viral copies this assay can detect is 138 copies/mL. A negative result does not preclude SARS-Cov-2 infection and should not be used  as the sole basis for treatment or other patient management decisions. A negative result may occur with  improper specimen collection/handling, submission of specimen other than nasopharyngeal swab, presence of viral mutation(s) within the areas targeted by this assay, and inadequate number of viral copies(<138 copies/mL). A negative result must be combined with clinical observations, patient history, and epidemiological information. The expected result is Negative.  Fact Sheet for Patients:  BloggerCourse.com  Fact Sheet for Healthcare Providers:  SeriousBroker.it  This test is no t yet approved or cleared by the [place] FDA and  has been authorized for detection and/or diagnosis of SARS-CoV-2 by FDA  under an Emergency Use Authorization (EUA). This EUA will remain  in effect (meaning this test can be used) for the duration of the COVID-19 declaration under Section 564(b)(1) of the Act, 21 U.S.C.section 360bbb-3(b)(1), unless the authorization is terminated  or revoked sooner.       Influenza A by PCR NEGATIVE NEGATIVE Final   Influenza B by PCR NEGATIVE NEGATIVE Final    Comment: (NOTE) The Xpert Xpress SARS-CoV-2/FLU/RSV plus assay is intended as an aid in the diagnosis of influenza from Nasopharyngeal swab specimens and should not be used as a sole basis for treatment. Nasal washings and aspirates are unacceptable for Xpert Xpress SARS-CoV-2/FLU/RSV testing.  Fact Sheet for Patients: BloggerCourse.com  Fact Sheet for Healthcare Providers: SeriousBroker.it  This test is not yet approved or cleared by the [place] FDA and has been authorized for detection and/or diagnosis of SARS-CoV-2 by FDA under an Emergency Use Authorization (EUA). This EUA will remain in effect (meaning this test can be used) for the duration of the COVID-19 declaration under Section 564(b)(1)  of the Act, 21 U.S.C. section 360bbb-3(b)(1), unless the authorization is terminated or revoked.  Performed at [facility], [address] [place], [place] [address]       Radiology Studies: CT ANGIO HEAD NECK W WO CM  Result Date: 05/25/2020 CLINICAL DATA:  Initial evaluation for focal neural deficit, stroke suspected. EXAM: CT ANGIOGRAPHY HEAD AND NECK TECHNIQUE: Multidetector CT imaging of the head and neck was performed using the standard protocol during bolus administration of intravenous contrast. Multiplanar CT image reconstructions and MIPs were obtained to evaluate the vascular anatomy. Carotid stenosis measurements (when applicable) are obtained utilizing NASCET criteria, using the distal internal carotid diameter as the denominator. CONTRAST:  20mL OMNIPAQUE IOHEXOL 350 MG/ML SOLN COMPARISON:  Prior CT from 07/19/2019 and MRA from 07/20/2019. FINDINGS: CT HEAD FINDINGS Brain: Generalized age-related cerebral atrophy with chronic microvascular ischemic disease. Remote lacunar infarct at the right basal ganglia with associated wallerian degeneration. Chronic encephalomalacia of with dystrophic calcification involving the left temporoparietal region again noted. Associated ex vacuo dilatation of the left lateral ventricle. No acute intracranial hemorrhage. No acute large vessel territory infarct. No mass lesion, mass effect or midline shift. No extra-axial fluid collection. Ventricles stable in size without hydrocephalus. Vascular: No hyperdense vessel. Scattered vascular calcifications noted within the carotid siphons. Skull: Scalp soft tissues and calvarium within normal limits. Sinuses: Mild scattered mucosal thickening noted within the ethmoidal air cells. Paranasal sinuses are otherwise largely clear. Trace left mastoid effusion noted, no full significance. Orbits: Left gaze noted. Globes and orbital soft tissues demonstrate no other acute finding. Review of the MIP images confirms  the above findings CTA NECK FINDINGS Aortic arch: Visualized aortic arch normal caliber with normal branch pattern. Mild atheromatous change about the aortic arch and origin of the great vessels without hemodynamically significant stenosis. Right carotid system: Right CCA patent from its origin to the bifurcation without stenosis. Bulky calcified plaque about the right carotid bulb/proximal right ICA with associated stenosis of up to 50% by NASCET criteria. Right ICA patent distally without stenosis, dissection or occlusion. Left carotid system: Left CCA patent from its origin to the bifurcation without stenosis. Bulky calcified plaque about the left carotid bulb with no more than mild 25% stenosis by NASCET criteria. Left ICA patent distally without stenosis, dissection or occlusion. Vertebral arteries: Left vertebral artery arises directly from the aortic arch. Calcified plaque at the origins of both vertebral arteries with estimated moderate  approximate 50% stenoses bilaterally. Vertebral arteries mildly tortuous but otherwise widely patent within the neck without stenosis, dissection or occlusion. Skeleton: No visible acute osseous finding. No discrete or worrisome osseous lesions. Other neck: No other acute soft tissue abnormality within the neck. No mass or adenopathy. Upper chest: Patchy and hazy opacity seen within the partially visualized right upper lobe, which could reflect atelectasis and/or infiltrate. Layering secretions noted within the subglottic trachea at the level of the carina with extension into the proximal right mainstem bronchus. Visualized upper chest demonstrates no other acute finding. Review of the MIP images confirms the above findings CTA HEAD FINDINGS Anterior circulation: Petrous segments patent bilaterally. Scattered atheromatous change within the carotid siphons bilaterally, left worse than right. Associated moderate stenosis at the supraclinoid left ICA. A1 segments patent  bilaterally. Right A1 is partially fenestrated distally. Normal anterior communicating artery complex. Anterior cerebral arteries patent to their distal aspects without stenosis. No M1 stenosis or occlusion. Normal MCA bifurcations. Distal MCA branches well perfused and symmetric. Posterior circulation: Focal calcified plaque noted within the proximal left V4 segment without significant stenosis. Vertebral arteries otherwise widely patent to the vertebrobasilar junction. Left PICA origin patent and normal. Right PICA not seen. Basilar patent to its distal aspect without stenosis. Superior cerebral arteries patent bilaterally. Left PCA supplied via the basilar. Fetal type origin of the right PCA. Both PCAs well perfused or distal aspects. Venous sinuses: Grossly patent allowing for timing the contrast bolus. Anatomic variants: Fetal type origin of the right PCA.  No aneurysm. Review of the MIP images confirms the above findings IMPRESSION: CT HEAD IMPRESSION: 1. No acute intracranial abnormality. 2. Chronic left temporoparietal and right basal ganglia infarcts. 3. Underlying atrophy with chronic microvascular ischemic disease. CTA HEAD AND NECK IMPRESSION: 1. Negative CTA for large vessel occlusion. 2. Atheromatous change about the carotid bifurcations with up to 50% stenosis on the right. 3. Moderate atheromatous stenosis at the supraclinoid left ICA. 4. Moderate estimated 50% stenoses at the origins of both vertebral arteries. 5. Hazy and patchy opacity within the posterior right upper lobe, which could reflect atelectasis or infiltrate. Layering secretions/debris seen within the subglottic trachea and right mainstem bronchus, raising the possibility for aspiration. CT PERFUSION IMPRESSION: Electronically Signed   By: Rise MuBenjamin  McClintock M.D.   On: 05/25/2020 22:14   DG Chest Portable 1 View  Result Date: 05/25/2020 CLINICAL DATA:  65 year old male with cough. EXAM: PORTABLE CHEST 1 VIEW COMPARISON:  Chest  radiograph dated 10/09/2019. FINDINGS: No focal consolidation, pleural effusion, pneumothorax. Stable cardiomegaly. Median sternotomy wires and CABG vascular clips. No acute osseous pathology. IMPRESSION: No active cardiopulmonary disease. Electronically Signed   By: Elgie CollardArash  Radparvar M.D.   On: 05/25/2020 20:44       LOS: 0 days   Nathaniel Wakeley Rito EhrlichKrishnan  [facility] Pager on [email or web address]  05/26/2020, 8:21 AM

## 2020-05-26 NOTE — ED Notes (Signed)
Patient likes to go by [name] Cruz and was able to open eyes to voice, doesn't answer questions but stated"no ma'am"

## 2020-05-26 NOTE — Progress Notes (Signed)
Pharmacy Antibiotic Note  [name] Cruz is a 65 y.o. male admitted on 05/25/2020 with asp pna.  Pharmacy has been consulted for Unasyn dosing.  Plan: Unasyn 3gm IV q6h Will f/u renal function, micro data, and pt's clinical condition    Temp (24hrs), Avg:98.8 F (37.1 C), Min:98.5 F (36.9 C), Max:99.1 F (37.3 C)  Recent Labs  Lab 05/25/20 2012 05/26/20 0225  WBC 9.7 11.4*  CREATININE 0.67 0.55*    CrCl cannot be calculated (Unknown ideal weight.).    Allergies  Allergen Reactions  . Losartan Other (See Comments)    hyperkalemia    Antimicrobials this admission: 5/13 Unasyn >>    Thank you for allowing pharmacy to be a part of this patient's care.  Christoper Fabian, PharmD, BCPS Please see amion for complete clinical pharmacist phone list 05/26/2020 5:37 AM

## 2020-05-26 NOTE — Progress Notes (Signed)
Upon entering room and completing assessment, it is noted that he has pulled off all EEG wires/monitoring device due to confusion. Notified neuro on call.

## 2020-05-26 NOTE — ED Notes (Signed)
Attempted report 

## 2020-05-26 NOTE — Progress Notes (Addendum)
Subjective: Continues to have some seizures overnight.  This morning he is awake, able to follow some commands.  ROS: Unable to obtain due to poor mental status  Examination  Vital signs in last 24 hours: Temp:  [98 F (36.7 C)-99.1 F (37.3 C)] 98 F (36.7 C) (05/13 1028) Pulse Rate:  [80-129] 85 (05/13 1224) Resp:  [11-24] 18 (05/13 1224) BP: (96-192)/(50-107) 117/61 (05/13 1224) SpO2:  [88 %-97 %] 94 % (05/13 1224)  General: lying in bed, not in apparent distress CVS: pulse-normal rate and rhythm RS: breathing comfortably, CTA B Extremities: normal, warm Neuro: Awake, alert, able to tell me his name, not oriented to time and place, PERRLA, EOMI, blinks to threat bilaterally, flattening of left nasolabial fold, antigravity strength in right upper extremity and bilateral lower extremities, left upper extremity spastic paresis, able to withdraw to noxious stimuli in left upper extremity  Basic Metabolic Panel: Recent Labs  Lab 05/25/20 2012 05/26/20 0225  NA 132* 132*  K 4.4 3.9  CL 98 98  CO2 27 25  GLUCOSE 111* 112*  BUN 10 8  CREATININE 0.67 0.55*  CALCIUM 8.7* 8.7*  MG 1.9 1.6*    CBC: Recent Labs  Lab 05/25/20 2012 05/26/20 0225  WBC 9.7 11.4*  NEUTROABS 5.3  --   HGB 14.0 12.9*  HCT 41.5 36.9*  MCV 95.2 92.9  PLT 228 211     Coagulation Studies: No results for input(s): LABPROT, INR in the last 72 hours.  Imaging CT head without contrast 05/25/2020: No acute abnormality.  Chronic left temporoparietal and right basal ganglia infarct  CTA head and neck 05/25/2020:Negative CTA for large vessel occlusion. Atheromatous change about the carotid bifurcations with up to 50% stenosis on the right. Moderate atheromatous stenosis at the supraclinoid left ICA. Moderate estimated 50% stenoses at the origins of both vertebral arteries. Hazy and patchy opacity within the posterior right upper lobe,which could reflect atelectasis or infiltrate.  Layering secretions/debris seen within the subglottic trachea and right mainstem bronchus, raising the possibility for aspiration.   ASSESSMENT AND PLAN: 65 year old male with history of strokes and epilepsy who presented in status epilepticus.  Status epilepticus (resolved) Epilepsy with breakthrough seizure Chronic strokes Hyponatremia -LTM EEG overnight continue to show more seizures arising from right frontotemporal region -Etiology of breakthrough seizures: No leukocytosis, mild hyponatremia with sodium of 132, Depakote level was 49, urine analysis ordered and pending  Recommendations -Patient was given IV Keppra 3500 mg at 2147, Depakote 500 mg at 2349 and IV Vimpat 200 mg at 2350.  Unfortunately the morning dose of Vimpat was significantly due to patient being transferred from ED to the floor and Depakote was not administered -Recommend continuing Keppra 1500 mg twice daily, Depakote 500 mg every 8 hours and Vimpat 200 mg twice daily. -If seizures continue, will consider adding phenobarbital or perampanel.  Can also on Dilantin but avoiding because patient already on Depakote -Continue seizure precautions -As needed IV Ativan 2 mg for clinical seizure lasting more than 2 minutes -Management of rest of comorbidities per primary team  I have spent a total of 35 minutes with the patient reviewing hospital notes,  test results, labs and examining the patient as well as establishing an assessment and plan.  > 50% of time was spent in direct patient care.   Lindie Spruce Epilepsy [facility] For questions after 5pm please refer to AMION to reach the Neurologist on call

## 2020-05-26 NOTE — H&P (Signed)
History and Physical    [name] Cruz NWG:[id number] DOB: 1955-03-18 DOA: 05/25/2020  PCP: [name], MD  Patient coming from: Home.  History obtained from ER physician.  Patient is postictal.  Chief Complaint: Seizure.  HPI: [name] Cruz is a 65 y.o. male with known history of stroke, seizures was found to have abnormal movement and confusion at home and was brought to the ER.  In the ER patient started developing abnormal movement in the left upper extremity and left-sided gaze.  ED Course: As per the ER physician patient was in status epilepticus for almost 20 minutes.  CT angiogram of the head and neck was done which did not show any large vessel obstruction but did show some signs of aspiration.  Patient was given 4 mg IV Ativan and Keppra was loaded per weight.  Neurologist at this time had EEG done and patient was started on IV Keppra Vimpat and valproate.  Valproic acid levels were 49 subtherapeutic.  At the time of my exam patient is still postictal.  Chest x-ray unremarkable COVID test negative.  Review of Systems: As per HPI, rest all negative.   Past Medical History:  Diagnosis Date  . Acute deep vein thrombosis (DVT) of left upper extremity (HCC)    L brachial and basilic veins, eliquis started 08/22/2017 to continue for 3 months total   . Cognitive impairment 2007   after stroke, saw rehab but told to stop because was too upsetting to him  . History of chicken pox   . HLD (hyperlipidemia)   . HTN (hypertension)   . NSTEMI (non-ST elevated myocardial infarction) (HCC) 02/21/2017  . Obesity   . Stroke, hemorrhagic (HCC) 2007   thought 2/2 HTN (240sbp); residual cognitive impairment, loss of R peripheral field, no driving    Past Surgical History:  Procedure Laterality Date  . ANKLE SURGERY  1990s   right foot with plate and screws  . CORONARY ARTERY BYPASS GRAFT N/A 02/24/2017   3v Procedure: CORONARY ARTERY BYPASS GRAFTING (CABG) x 3 ON PUMP USING LEFT  INTERNAL MAMMARY ARTERY TO LEFT ANTERIOR DESENDING CORNARY ARTERY, RIGHT GREATER SAPHENOUS VEIN TO LEFT CIRCUMFLEX ARTERY AND POSTERIOR DESENDING ARTERY. RIGHT GREATER SAPHENOUS VEIN OBTAINED VIA ENDOVEIN HARVEST.;  Surgeon: Delight Ovens, MD  . IABP INSERTION N/A 02/21/2017   Procedure: IABP Insertion;  Surgeon: Yvonne Kendall, MD;  Location: [facility];  Service: Cardiovascular;  Laterality: N/A;  . LEFT HEART CATH AND CORONARY ANGIOGRAPHY N/A 02/21/2017   Procedure: LEFT HEART CATH AND CORONARY ANGIOGRAPHY;  Surgeon: Yvonne Kendall, MD;  Location: [facility];  Service: Cardiovascular;  Laterality: N/A;  . TEE WITHOUT CARDIOVERSION N/A 02/24/2017   Procedure: TRANSESOPHAGEAL ECHOCARDIOGRAM (TEE);  Surgeon: Delight Ovens, MD;  Location: [facility] OR;  Service: Open Heart Surgery;  Laterality: N/A;     reports that he has never smoked. He has quit using smokeless tobacco. He reports previous alcohol use. He reports that he does not use drugs.  Allergies  Allergen Reactions  . Losartan Other (See Comments)    hyperkalemia    Family History  Problem Relation Age of Onset  . Alzheimer's disease Maternal Grandfather   . Cancer Mother        lymphoma  . Alcohol abuse Father        smoker  . Coronary artery disease Neg Hx   . Stroke Neg Hx   . Diabetes Neg Hx     Prior to Admission medications  Medication Sig Start Date End Date Taking? Authorizing Provider  aspirin EC 81 MG tablet Take 1 tablet (81 mg total) by mouth daily. Swallow whole. 03/06/20  Yes McCue, Shanda BumpsJessica, NP  atorvastatin (LIPITOR) 80 MG tablet TAKE 1 TABLET BY MOUTH EVERY DAY Patient taking differently: Take 80 mg by mouth daily. 05/17/20  Yes [name], [name], MD  Cyanocobalamin (VITAMIN B-12) 1000 MCG SUBL Place 1,000 mcg under the tongue daily.   Yes [provider]  divalproex (DEPAKOTE) 500 MG DR tablet Take 1 tablet (500 mg total) by mouth 2 (two) times daily. 03/06/20  Yes McCue, Shanda BumpsJessica, NP   lacosamide (VIMPAT) 200 MG TABS tablet 1 tablet at 8 am and 1 tablet at 10 pm. Patient taking differently: Take 200 mg by mouth 2 (two) times daily. 03/06/20  Yes McCue, Shanda BumpsJessica, NP  levETIRAcetam (KEPPRA) 750 MG tablet Take 2 tablets (1,500 mg total) by mouth 2 (two) times daily. 03/06/20  Yes Ihor AustinMcCue, Jessica, NP  Multiple Vitamin (MULTIVITAMIN WITH MINERALS) TABS tablet Take 1 tablet by mouth daily. 12/26/17  Yes [name], [name], MD  sertraline (ZOLOFT) 25 MG tablet TAKE 1 TABLET BY MOUTH EVERY DAY Patient taking differently: Take 25 mg by mouth daily. 05/24/20  Yes [name], [name], MD  ziprasidone (GEODON) 20 MG capsule Take 1 capsule (20 mg total) by mouth at bedtime. 03/13/20  Yes [name], [name], MD  mupirocin cream (BACTROBAN) 2 % Apply 1 application topically 2 (two) times daily. Patient not taking: No sig reported 04/18/20   [name], [name], MD    Physical Exam: Constitutional: Moderately built and nourished. Vitals:   05/25/20 2300 05/26/20 0030 05/26/20 0100 05/26/20 0101  BP: 108/77 126/75 (!) 109/91   Pulse: 88 91 99   Resp: 15 15 12    Temp:    98.5 F (36.9 C)  TempSrc:    Oral  SpO2: 96% 96% 94%    Eyes: Anicteric no pallor. ENMT: No discharge from the ears eyes nose or mouth. Neck: No mass felt.  No neck rigidity. Respiratory: No rhonchi or crepitations. Cardiovascular: S1-S2 heard. Abdomen: Soft nontender bowel sound present. Musculoskeletal: No edema. Skin: No rash. Neurologic: Patient is encephalopathic. Psychiatric: Patient is encephalopathic.   Labs on Admission: I have personally reviewed following labs and imaging studies  CBC: Recent Labs  Lab 05/25/20 2012  WBC 9.7  NEUTROABS 5.3  HGB 14.0  HCT 41.5  MCV 95.2  PLT 228   Basic Metabolic Panel: Recent Labs  Lab 05/25/20 2012  NA 132*  K 4.4  CL 98  CO2 27  GLUCOSE 111*  BUN 10  CREATININE 0.67  CALCIUM 8.7*  MG 1.9   GFR: CrCl cannot be calculated (Unknown ideal weight.). Liver  Function Tests: Recent Labs  Lab 05/25/20 2355  ALBUMIN 3.4*   No results for input(s): LIPASE, AMYLASE in the last 168 hours. No results for input(s): AMMONIA in the last 168 hours. Coagulation Profile: No results for input(s): INR, PROTIME in the last 168 hours. Cardiac Enzymes: No results for input(s): CKTOTAL, CKMB, CKMBINDEX, TROPONINI in the last 168 hours. BNP (last 3 results) No results for input(s): PROBNP in the last 8760 hours. HbA1C: No results for input(s): HGBA1C in the last 72 hours. CBG: Recent Labs  Lab 05/25/20 2015  GLUCAP 102*   Lipid Profile: No results for input(s): CHOL, HDL, LDLCALC, TRIG, CHOLHDL, LDLDIRECT in the last 72 hours. Thyroid Function Tests: No results for input(s): TSH, T4TOTAL, FREET4, T3FREE, THYROIDAB in the last 72 hours. Anemia Panel:  No results for input(s): VITAMINB12, FOLATE, FERRITIN, TIBC, IRON, RETICCTPCT in the last 72 hours. Urine analysis:    Component Value Date/Time   COLORURINE YELLOW 08/06/2019 0710   APPEARANCEUR CLEAR 08/06/2019 0710   LABSPEC 1.016 08/06/2019 0710   PHURINE 5.0 08/06/2019 0710   GLUCOSEU NEGATIVE 08/06/2019 0710   HGBUR NEGATIVE 08/06/2019 0710   BILIRUBINUR NEGATIVE 08/06/2019 0710   KETONESUR 5 (A) 08/06/2019 0710   PROTEINUR NEGATIVE 08/06/2019 0710   UROBILINOGEN 0.2 04/26/2012 1900   NITRITE NEGATIVE 08/06/2019 0710   LEUKOCYTESUR NEGATIVE 08/06/2019 0710   Sepsis Labs: @LABRCNTIP (procalcitonin:4,lacticidven:4) ) Recent Results (from the past 240 hour(s))  Resp Panel by RT-PCR (Flu A&B, Covid) Nasopharyngeal Swab     Status: None   Collection Time: 05/25/20  8:12 PM   Specimen: Nasopharyngeal Swab; Nasopharyngeal(NP) swabs in vial transport medium  Result Value Ref Range Status   SARS Coronavirus 2 by RT PCR NEGATIVE NEGATIVE Final    Comment: (NOTE) SARS-CoV-2 target nucleic acids are NOT DETECTED.  The SARS-CoV-2 RNA is generally detectable in upper respiratory specimens during  the acute phase of infection. The lowest concentration of SARS-CoV-2 viral copies this assay can detect is 138 copies/mL. A negative result does not preclude SARS-Cov-2 infection and should not be used as the sole basis for treatment or other patient management decisions. A negative result may occur with  improper specimen collection/handling, submission of specimen other than nasopharyngeal swab, presence of viral mutation(s) within the areas targeted by this assay, and inadequate number of viral copies(<138 copies/mL). A negative result must be combined with clinical observations, patient history, and epidemiological information. The expected result is Negative.  Fact Sheet for Patients:  07/25/20  Fact Sheet for Healthcare Providers:  BloggerCourse.com  This test is no t yet approved or cleared by the [employer]ious[employer].[place] FDA and  has been authorized for detection and/or diagnosis of SARS-CoV-2 by FDA under an Emergency Use Authorization (EUA). This EUA will remain  in effect (meaning this test can be used) for the duration of the COVID-19 declaration under Section 564(b)(1) of the Act, 21 U.S.C.section 360bbb-3(b)(1), unless the authorization is terminated  or revoked sooner.       Influenza A by PCR NEGATIVE NEGATIVE Final   Influenza B by PCR NEGATIVE NEGATIVE Final    Comment: (NOTE) The Xpert Xpress SARS-CoV-2/FLU/RSV plus assay is intended as an aid in the diagnosis of influenza from Nasopharyngeal swab specimens and should not be used as a sole basis for treatment. Nasal washings and aspirates are unacceptable for Xpert Xpress SARS-CoV-2/FLU/RSV testing.  Fact Sheet for Patients: [place]  Fact Sheet for Healthcare Providers: BloggerCourse.com  This test is not yet approved or cleared by the [employer]ious[employer]ker.it FDA and has been authorized for detection and/or  diagnosis of SARS-CoV-2 by FDA under an Emergency Use Authorization (EUA). This EUA will remain in effect (meaning this test can be used) for the duration of the COVID-19 declaration under Section 564(b)(1) of the Act, 21 U.S.C. section 360bbb-3(b)(1), unless the authorization is terminated or revoked.  Performed at [facility], [address], [place], [place]      Radiological Exams on Admission: CT ANGIO HEAD NECK W WO CM  Result Date: 05/25/2020 CLINICAL DATA:  Initial evaluation for focal neural deficit, stroke suspected. EXAM: CT ANGIOGRAPHY HEAD AND NECK TECHNIQUE: Multidetector CT imaging of the head and neck was performed using the standard protocol during bolus administration of intravenous contrast. Multiplanar CT image reconstructions and MIPs were  obtained to evaluate the vascular anatomy. Carotid stenosis measurements (when applicable) are obtained utilizing NASCET criteria, using the distal internal carotid diameter as the denominator. CONTRAST:  17mL OMNIPAQUE IOHEXOL 350 MG/ML SOLN COMPARISON:  Prior CT from 07/19/2019 and MRA from 07/20/2019. FINDINGS: CT HEAD FINDINGS Brain: Generalized age-related cerebral atrophy with chronic microvascular ischemic disease. Remote lacunar infarct at the right basal ganglia with associated wallerian degeneration. Chronic encephalomalacia of with dystrophic calcification involving the left temporoparietal region again noted. Associated ex vacuo dilatation of the left lateral ventricle. No acute intracranial hemorrhage. No acute large vessel territory infarct. No mass lesion, mass effect or midline shift. No extra-axial fluid collection. Ventricles stable in size without hydrocephalus. Vascular: No hyperdense vessel. Scattered vascular calcifications noted within the carotid siphons. Skull: Scalp soft tissues and calvarium within normal limits. Sinuses: Mild scattered mucosal thickening noted within the ethmoidal air cells. Paranasal  sinuses are otherwise largely clear. Trace left mastoid effusion noted, no full significance. Orbits: Left gaze noted. Globes and orbital soft tissues demonstrate no other acute finding. Review of the MIP images confirms the above findings CTA NECK FINDINGS Aortic arch: Visualized aortic arch normal caliber with normal branch pattern. Mild atheromatous change about the aortic arch and origin of the great vessels without hemodynamically significant stenosis. Right carotid system: Right CCA patent from its origin to the bifurcation without stenosis. Bulky calcified plaque about the right carotid bulb/proximal right ICA with associated stenosis of up to 50% by NASCET criteria. Right ICA patent distally without stenosis, dissection or occlusion. Left carotid system: Left CCA patent from its origin to the bifurcation without stenosis. Bulky calcified plaque about the left carotid bulb with no more than mild 25% stenosis by NASCET criteria. Left ICA patent distally without stenosis, dissection or occlusion. Vertebral arteries: Left vertebral artery arises directly from the aortic arch. Calcified plaque at the origins of both vertebral arteries with estimated moderate approximate 50% stenoses bilaterally. Vertebral arteries mildly tortuous but otherwise widely patent within the neck without stenosis, dissection or occlusion. Skeleton: No visible acute osseous finding. No discrete or worrisome osseous lesions. Other neck: No other acute soft tissue abnormality within the neck. No mass or adenopathy. Upper chest: Patchy and hazy opacity seen within the partially visualized right upper lobe, which could reflect atelectasis and/or infiltrate. Layering secretions noted within the subglottic trachea at the level of the carina with extension into the proximal right mainstem bronchus. Visualized upper chest demonstrates no other acute finding. Review of the MIP images confirms the above findings CTA HEAD FINDINGS Anterior  circulation: Petrous segments patent bilaterally. Scattered atheromatous change within the carotid siphons bilaterally, left worse than right. Associated moderate stenosis at the supraclinoid left ICA. A1 segments patent bilaterally. Right A1 is partially fenestrated distally. Normal anterior communicating artery complex. Anterior cerebral arteries patent to their distal aspects without stenosis. No M1 stenosis or occlusion. Normal MCA bifurcations. Distal MCA branches well perfused and symmetric. Posterior circulation: Focal calcified plaque noted within the proximal left V4 segment without significant stenosis. Vertebral arteries otherwise widely patent to the vertebrobasilar junction. Left PICA origin patent and normal. Right PICA not seen. Basilar patent to its distal aspect without stenosis. Superior cerebral arteries patent bilaterally. Left PCA supplied via the basilar. Fetal type origin of the right PCA. Both PCAs well perfused or distal aspects. Venous sinuses: Grossly patent allowing for timing the contrast bolus. Anatomic variants: Fetal type origin of the right PCA.  No aneurysm. Review of the MIP images confirms the above findings  IMPRESSION: CT HEAD IMPRESSION: 1. No acute intracranial abnormality. 2. Chronic left temporoparietal and right basal ganglia infarcts. 3. Underlying atrophy with chronic microvascular ischemic disease. CTA HEAD AND NECK IMPRESSION: 1. Negative CTA for large vessel occlusion. 2. Atheromatous change about the carotid bifurcations with up to 50% stenosis on the right. 3. Moderate atheromatous stenosis at the supraclinoid left ICA. 4. Moderate estimated 50% stenoses at the origins of both vertebral arteries. 5. Hazy and patchy opacity within the posterior right upper lobe, which could reflect atelectasis or infiltrate. Layering secretions/debris seen within the subglottic trachea and right mainstem bronchus, raising the possibility for aspiration. CT PERFUSION IMPRESSION:  Electronically Signed   By: Rise Mu M.D.   On: 05/25/2020 22:14   DG Chest Portable 1 View  Result Date: 05/25/2020 CLINICAL DATA:  65 year old male with cough. EXAM: PORTABLE CHEST 1 VIEW COMPARISON:  Chest radiograph dated 10/09/2019. FINDINGS: No focal consolidation, pleural effusion, pneumothorax. Stable cardiomegaly. Median sternotomy wires and CABG vascular clips. No acute osseous pathology. IMPRESSION: No active cardiopulmonary disease. Electronically Signed   By: Elgie Collard M.D.   On: 05/25/2020 20:44      Assessment/Plan Principal Problem:   Status epilepticus (HCC) Active Problems:   History of stroke with current residual effects   Cognitive impairment   HTN (hypertension)   Seizure disorder (HCC)   S/P CABG x 3   Atrial fibrillation (HCC)    1. Status epilepticus  -aborted after giving 4 mg IV Ativan and Keppra loading dose.  Appreciate neurology consult at this time patient's Depakote levels have been increased.  All antiseizure medications have been ordered IV at this time including Keppra Vimpat and Depakote.  Patient is kept n.p.o. for now. 2. Possible aspiration risk had Unasyn for now.  Closely monitor respiratory status. 3. History of stroke presently NPO.   DVT prophylaxis: Lovenox. Code Status: Full code. Family Communication: We will need to discuss with family. Disposition Plan: Home when stable. Consults called: Neurology. Admission status: Observation.   Eduard Clos MD [facility] Pager [phone].  If 7PM-7AM, please contact night-coverage [email or web address] Password [email or web address]  05/26/2020, 1:09 AM

## 2020-05-26 NOTE — Progress Notes (Signed)
Assisted pt move with EEG equipment Ed to [phone] without any issues. Maintenance EEG leads.No skin breakdown seen at electrode site. A1 F3 Fp1 Fp2. Continue to monitor

## 2020-05-26 NOTE — Procedures (Addendum)
Patient Name: [name] Cruz  MRN: [id number]  Epilepsy Attending: [name]  Referring Physician/Provider: Dr. Onalee Hua Duration: 05/25/2020 2138 to 05/26/2020 1825  Patient history:  65 year old male with a history of seizures with breakthrough seizures.  EEG to evaluate for seizures.  Level of alertness: Awake, asleep  AEDs during EEG study: Keppra, Vimpat, Depakote  Technical aspects: This EEG study was done with scalp electrodes positioned according to the 10-20 International system of electrode placement. Electrical activity was acquired at a sampling rate of 500Hz  and reviewed with a high frequency filter of 70Hz  and a low frequency filter of 1Hz . EEG data were recorded continuously and digitally stored.   Description: The posterior dominant rhythm consists of 9 Hz activity of moderate voltage (25-35 uV) seen predominantly in posterior head regions, symmetric and reactive to eye opening and eye closing. Sleep was characterized by vertex waves, sleep spindles (12 to 14 Hz), maximal frontocentral region. EEG showed continuous  3 to 6 Hz theta-delta slowing in right hemisphere. Sharp waves are also noted in right frontotemporal region. Intermittent rhythmic 3 to 5 Hz theta-delta slowing was seen in left temporal region. Intermittent generalized 3 to 5 Hz theta-delta slowing was also noted.  Four episodes were noted on 05/26/2020 at 0424, 0521, 0707 and 0943 during which patient was noted to be laying in bed, looking towards right. Concomitant eeg showed 6-7hz  sharply contoured theta slowing in right frontotemporal region which gradually evolved into 2-3Hz  delta slowing and spread to involve right hemisphere consistent with seizure. Duration of seizures was about 45 seconds to 1.5 mins  Hyperventilation and photic stimulation were not performed.    Study was not interpretable after 1825 as patient pulled his electrodes.    ABNORMALITY - Seizure,right frontotemporal  region -Sharp wave, right frontotemporal region - Continuous slow, right hemisphere - Intermittent rhythmic slow, left temporal region - Intermittent slow, generalized  IMPRESSION: This study showed four seizures on 05/26/2020 at 0424, 0521, 0707 and 0943 during which patient was noted to be laying in bed, looking towards right, arising from right frontotemporal region, average duration 45 seconds to 1.5 mins.  There is evidence of epileptogenicity and cortical dysfunction in right hemisphere likely secondary to underlying structural abnormality.  Additionally, there is nonspecific cortical dysfunction in left temporal region as well as mild diffuse encephalopathy, nonspecific etiology but likely secondary to ictal-postictal state.    Tylon Kemmerling [id number]

## 2020-05-27 LAB — GLUCOSE, CAPILLARY
Glucose-Capillary: 90 mg/dL (ref 70–99)
Glucose-Capillary: 89 mg/dL (ref 70–99)
Glucose-Capillary: 105 mg/dL — ABNORMAL HIGH (ref 70–99)
Glucose-Capillary: 108 mg/dL — ABNORMAL HIGH (ref 70–99)
Glucose-Capillary: 130 mg/dL — ABNORMAL HIGH (ref 70–99)

## 2020-05-27 LAB — BASIC METABOLIC PANEL
Anion gap: 8 (ref 5–15)
BUN: 6 mg/dL — ABNORMAL LOW (ref 8–23)
CO2: 25 mmol/L (ref 22–32)
Calcium: 8.4 mg/dL — ABNORMAL LOW (ref 8.9–10.3)
Chloride: 102 mmol/L (ref 98–111)
Creatinine, Ser: 0.53 mg/dL — ABNORMAL LOW (ref 0.61–1.24)
GFR, Estimated: >60 mL/min (ref >60)
Glucose, Bld: 89 mg/dL (ref 70–99)
Potassium: 3.6 mmol/L (ref 3.5–5.1)
Sodium: 135 mmol/L (ref 135–145)

## 2020-05-27 LAB — CBC
HCT: 34 % — ABNORMAL LOW (ref 39.0–52.0)
Hemoglobin: 11.7 g/dL — ABNORMAL LOW (ref 13.0–17.0)
MCH: 32.2 pg (ref 26.0–34.0)
MCHC: 34.4 g/dL (ref 30.0–36.0)
MCV: 93.7 fL (ref 80.0–100.0)
Platelets: 185 10*3/uL (ref 150–400)
RBC: 3.63 MIL/uL — ABNORMAL LOW (ref 4.22–5.81)
RDW: 13.2 % (ref 11.5–15.5)
WBC: 7 10*3/uL (ref 4.0–10.5)
nRBC: 0 % (ref 0.0–0.2)

## 2020-05-27 LAB — MAGNESIUM: Magnesium: 1.8 mg/dL (ref 1.7–2.4)

## 2020-05-27 MED ORDER — POTASSIUM CHLORIDE 10 MEQ/100ML IV SOLN
10.0000 meq | INTRAVENOUS | Status: AC
  Administered 2020-05-27 (×2): 10 meq via INTRAVENOUS
  Filled 2020-05-27 (×2): qty 100

## 2020-05-27 NOTE — Progress Notes (Signed)
Neurology Progress Note  S: When patient asked if he has had seizures, he replies he does not know, and apologizes repeatedly. Per RN, there have been no witnessed seizures overnight. He remains on LTM. He is asking for water. He is NPO due to aspiration PNA and is on antibiotics. He perseverates on water even after explanation x 3 as to why he can not drink.   Brief HPI: Patient is s/p strokes in 2020 and 2021. + seizure disorder. Last saw neurology out patient on 03/06/20 with decision to restart ASA which [facility] was previously held due to stroke with hemorrhagic conversion. AC was not recommended even in the face of Afib because risks outweighed benefits.   Patient found to be in status epilepticus and was loaded with AEDs and status resolved.   Patient was seen by epileptologist yesterday and medications are at the dose recommended by her. Plan: if further seizures, can add an additional AED.   O: Current vital signs: BP 137/80 (BP Location: Right Arm)   Pulse 83   Temp 98 F (36.7 C) (Oral)   Resp 18   SpO2 96%  Vital signs in last 24 hours: Temp:  [97.4 F (36.3 C)-98.4 F (36.9 C)] 98 F (36.7 C) (05/14 0822) Pulse Rate:  [74-99] 83 (05/14 0822) Resp:  [18-19] 18 (05/14 0822) BP: (94-137)/(57-110) 137/80 (05/14 0822) SpO2:  [94 %-97 %] 96 % (05/14 0822)  GENERAL: Awake, alert in NAD HEENT: Normocephalic and atraumatic. EEG on.  LUNGS: Normal respiratory effort.  CV: RRR.  Ext: warm Psych: slow cognition.    NEURO:  Mental Status: Alert, oriented to self only. He will follow simple commands but not at other times.  Speech/Language: speech is without aphasia or dysarthria.  Naming is not intact and will not repeat sentences. Fluency +. Bradyphrenia noted.   Cranial Nerves:  II: Pupils-slightly larger pupil on OS, but both reactive.  III, IV, VI: EOMI. Eyelids elevate symmetrically.  V: Sensation is intact to light touch and symmetrical to face.  VII: Smile is symmetrical.   VIII: hearing intact to voice. IX, X: Palate elevates symmetrically. Phonation is normal.  XII: tongue is midline without fasciculations. Motor: RUE: grip 5/5  Biceps 4-/5   Triceps: 4/5.  LUE: Grip not completely checked due to contraction of left hand. Biceps: 4/5   Triceps  3+/5 Tone: is rigid to LUE, normal elsewhere. Bulk is normal Sensation- Intact to light touch bilaterally.     Coordination: unable to perform due to cognitive issues.  Gait- deferred  Medications  Current Facility-Administered Medications:  .  0.45 % sodium chloride infusion, , Intravenous, Continuous, Osvaldo Shipper, MD, Last Rate: 50 mL/hr at 05/27/20 0737, Infusion Verify at 05/27/20 0737 .  acetaminophen (TYLENOL) tablet 650 mg, 650 mg, Oral, Q6H PRN **OR** acetaminophen (TYLENOL) suppository 650 mg, 650 mg, Rectal, Q6H PRN, Eduard Clos, MD .  Ampicillin-Sulbactam (UNASYN) 3 g in sodium chloride 0.9 % 100 mL IVPB, 3 g, Intravenous, Q6H, Titus Mould, RPH, Stopped at 05/27/20 0556 .  enoxaparin (LOVENOX) injection 40 mg, 40 mg, Subcutaneous, Q24H, Eduard Clos, MD, 40 mg at 05/27/20 0954 .  lacosamide (VIMPAT) 200 mg in sodium chloride 0.9 % 25 mL IVPB, 200 mg, Intravenous, Q12H, Eduard Clos, MD, Last Rate: 90 mL/hr at 05/27/20 1020, 200 mg at 05/27/20 1020 .  levETIRAcetam (KEPPRA) IVPB 1500 mg/ 100 mL premix, 1,500 mg, Intravenous, Q12H, Eduard Clos, MD, Last Rate: 400 mL/hr at 05/27/20 0954, 1,500  mg at 05/27/20 0954 .  potassium chloride 10 mEq in 100 mL IVPB, 10 mEq, Intravenous, Q1 Hr x 2, Osvaldo Shipper, MD .  valproate (DEPACON) 500 mg in dextrose 5 % 50 mL IVPB, 500 mg, Intravenous, Q8H, Eduard Clos, MD, Stopped at 05/27/20 9381  Imaging MD has reviewed images in epic and the results pertinent to this consultation are:  CTH 1. No acute intracranial abnormality. 2. Chronic left temporoparietal and right basal ganglia infarcts. 3. Underlying atrophy with  chronic microvascular ischemic disease.  CTA HEAD AND NECK 1. Negative CTA for large vessel occlusion. 2. Atheromatous change about the carotid bifurcations with up to 50% stenosis on the right. 3. Moderate atheromatous stenosis at the supraclinoid left ICA. 4. Moderate estimated 50% stenoses at the origins of both vertebral arteries. 5. Hazy and patchy opacity within the posterior right upper lobe, which could reflect atelectasis or infiltrate. Layering secretions/debris seen within the subglottic trachea and right mainstem bronchus, raising the possibility for aspiration.  EEG on 05/26/20 There is evidence of epileptogenicity and cortical dysfunction in right hemisphere likely secondary to underlying structural abnormality.  Additionally, there is nonspecific cortical dysfunction in left temporal region as well as mild diffuse encephalopathy, nonspecific etiology but likely secondary to ictal-postictal state.    Assessment: 65 yo cognitively impaired male with hx of prior strokes and seizure disorder. Patient presented to ED on 05/25/20 after having activity concerning for seizure at home. In ED, he was basically unresponsive and found to be in status epilepticus. He was loaded with Keppra and continued on his regular seizure regimen except for the increase in Depakote. He was later alert. He did have evidence of seizures on LTM 05/26/20, but none since then no recorded seizures, but he has LTM in place.   Plan: -For now, continue present doses of AEDs and will reevaluate LTM in a.m. If no further seizures and he has no medical issue requiring hospitalization, he could likely be discharged. Should he have evidence of seizures overnight tonight, will likely add another AED.  -may switch AEDs to po when able to safely swallow.  -seizure precautions.  -neuro will follow.   Pt seen by Jimmye Norman, MSN, APN-BC/Nurse Practitioner/Neuro and later by MD. Note and plan to be edited as needed  by MD.  Pager: [phone]

## 2020-05-27 NOTE — Progress Notes (Signed)
[facility] PROGRESS NOTE   [name] Cruz ION:[id number] DOB: Jun 27, 1955 DOA: 05/25/2020  PCP: [name], [name], MD  Brief History/Interval Summary: [age] y.o. male with known history of stroke, seizures was found to have abnormal movement and confusion at home and was brought to the ER.  In the ER patient started developing abnormal movement in the left upper extremity and left-sided gaze.  Apparently was in status epilepticus for about 20 minutes. CT angiogram of the head and neck was done which did not show any large vessel obstruction but did show some signs of aspiration.  Patient was given 4 mg IV Ativan and Keppra was loaded per weight.  Neurologist at this time had EEG done and patient was started on IV Keppra Vimpat and valproate.  Valproic acid levels were 49 subtherapeutic.    Patient was hospitalized for further management.  Reason for Visit: Status epilepticus  Consultants: Neurology  Procedures: EEG, long-term monitoring  Antibiotics: Anti-infectives (From admission, onward)   Start     Dose/Rate Route Frequency Ordered Stop   05/26/20 0600  Ampicillin-Sulbactam (UNASYN) 3 g in sodium chloride 0.9 % 100 mL IVPB        3 g 200 mL/hr over 30 Minutes Intravenous Every 6 hours 05/26/20 0539        Subjective/Interval History: Patient noted to be a little bit more responsive today compared to yesterday but still does not answer questions appropriately.  No family at bedside.       Assessment/Plan:  Status epilepticus in the setting of known seizure disorder In the ED patient was given 4 mg of Ativan and Keppra loading dose.   Patient currently on long-term monitoring.  Neurology is following and managing.   Currently noted to be on Vimpat, Keppra, valproate.  All of these have been given intravenously.  Aspiration pneumonia Crackles heard on examination.  No pneumonia noted on chest x-ray.  He likely aspirated due to seizure activity.  Currently on Unasyn.   Noted to be afebrile.  WBC is normal.  Respiratory status appears to be stable.  He is currently on room air saturating in the mid 90s.  We will consult speech therapy.  Patient remains NPO.  Hypomagnesemia This was repleted.  Magnesium 1.8 this morning.  Replace potassium.  Normocytic anemia No evidence of overt bleeding.  Likely dilutional drop.  Monitor.  History of stroke with known left-sided weakness Stable.  Does not appear to have had a new event.  Neurological deficits due to seizure activity.  Patient was on aspirin and atorvastatin prior to admission.  These can be resumed when the patient is able to take orally.  Obesity Estimated body mass index is 30.58 kg/m as calculated from the following:   Height as of 03/13/20: 5\' 3"  (1.6 m).   Weight as of 10/09/19: 78.3 kg.   DVT Prophylaxis: Lovenox Code Status: Full code Family Communication: No family at bedside.  We will reach out to patient's sister. Disposition Plan: To be determined.  Status is: Inpatient  Remains inpatient appropriate because:Altered mental status, Ongoing diagnostic testing needed not appropriate for outpatient work up and Inpatient level of care appropriate due to severity of illness   Dispo:  Patient From: Home  Planned Disposition: To be determined  Medically stable for discharge: No          Medications:  Scheduled: . enoxaparin (LOVENOX) injection  40 mg Subcutaneous Q24H   Continuous: . sodium chloride 50 mL/hr at 05/27/20 0737  . ampicillin-sulbactam (  UNASYN) IV Stopped (05/27/20 0556)  . lacosamide (VIMPAT) IV Stopped (05/27/20 0700)  . levETIRAcetam 1,500 mg (05/27/20 0954)  . valproate sodium Stopped (05/27/20 0722)   TIR:[id number] **OR** acetaminophen   Objective:  Vital Signs  Vitals:   05/26/20 1949 05/26/20 2327 05/27/20 0318 05/27/20 0822  BP: 136/75 94/63 (!) 110/57 137/80  Pulse: 99 74 80 83  Resp: 19 18 18 18   Temp: 98.3 F (36.8 C) 98.4 F (36.9 C) (!)  97.4 F (36.3 C) 98 F (36.7 C)  TempSrc: Oral Oral Oral Oral  SpO2: 94% 97% 95% 96%    Intake/Output Summary (Last 24 hours) at 05/27/2020 1004 Last data filed at 05/27/2020 0737 Gross per 24 hour  Intake 1894.65 ml  Output 1150 ml  Net 744.65 ml   There were no vitals filed for this visit.  General appearance: Somnolent but easily arousable.  He was a single words to answer certain questions but not appropriate responses at times. Resp: Normal effort at rest.  Few crackles right lung.  No wheezing. Cardio: S1-S2 is normal regular.  No S3-S4.  No rubs murmurs or bruit GI: Abdomen is soft.  Nontender nondistended.  Bowel sounds are present normal.  No masses organomegaly Extremities: No edema. Neurologic: Slightly improved mentation compared to yesterday though he remains confused.  Noted to have left sided weakness which is known from previous stroke.    Lab Results:  Data Reviewed: I have personally reviewed following labs and imaging studies  CBC: Recent Labs  Lab 05/25/20 2012 05/26/20 0225 05/27/20 0321  WBC 9.7 11.4* 7.0  NEUTROABS 5.3  --   --   HGB 14.0 12.9* 11.7*  HCT 41.5 36.9* 34.0*  MCV 95.2 92.9 93.7  PLT 228 211 185    Basic Metabolic Panel: Recent Labs  Lab 05/25/20 2012 05/26/20 0225 05/27/20 0321  NA 132* 132* 135  K 4.4 3.9 3.6  CL 98 98 102  CO2 27 25 25   GLUCOSE 111* 112* 89  BUN 10 8 6*  CREATININE 0.67 0.55* 0.53*  CALCIUM 8.7* 8.7* 8.4*  MG 1.9 1.6* 1.8    GFR: CrCl cannot be calculated (Unknown ideal weight.).  Liver Function Tests: Recent Labs  Lab 05/25/20 2355 05/26/20 0225  AST  --  21  ALT  --  22  ALKPHOS  --  45  BILITOT  --  0.6  PROT  --  5.8*  ALBUMIN 3.4* 3.3*    CBG: Recent Labs  Lab 05/26/20 1632 05/26/20 1951 05/26/20 2328 05/27/20 0319 05/27/20 0823  GLUCAP 96 109* 108* 90 89     Recent Results (from the past 240 hour(s))  Resp Panel by RT-PCR (Flu A&B, Covid) Nasopharyngeal Swab     Status:  None   Collection Time: 05/25/20  8:12 PM   Specimen: Nasopharyngeal Swab; Nasopharyngeal(NP) swabs in vial transport medium  Result Value Ref Range Status   SARS Coronavirus 2 by RT PCR NEGATIVE NEGATIVE Final    Comment: (NOTE) SARS-CoV-2 target nucleic acids are NOT DETECTED.  The SARS-CoV-2 RNA is generally detectable in upper respiratory specimens during the acute phase of infection. The lowest concentration of SARS-CoV-2 viral copies this assay can detect is 138 copies/mL. A negative result does not preclude SARS-Cov-2 infection and should not be used as the sole basis for treatment or other patient management decisions. A negative result may occur with  improper specimen collection/handling, submission of specimen other than nasopharyngeal swab, presence of viral mutation(s) within the areas targeted  by this assay, and inadequate number of viral copies(<138 copies/mL). A negative result must be combined with clinical observations, patient history, and epidemiological information. The expected result is Negative.  Fact Sheet for Patients:  BloggerCourse.com  Fact Sheet for Healthcare Providers:  SeriousBroker.it  This test is no t yet approved or cleared by the [place] FDA and  has been authorized for detection and/or diagnosis of SARS-CoV-2 by FDA under an Emergency Use Authorization (EUA). This EUA will remain  in effect (meaning this test can be used) for the duration of the COVID-19 declaration under Section 564(b)(1) of the Act, 21 U.S.C.section 360bbb-3(b)(1), unless the authorization is terminated  or revoked sooner.       Influenza A by PCR NEGATIVE NEGATIVE Final   Influenza B by PCR NEGATIVE NEGATIVE Final    Comment: (NOTE) The Xpert Xpress SARS-CoV-2/FLU/RSV plus assay is intended as an aid in the diagnosis of influenza from Nasopharyngeal swab specimens and should not be used as a sole basis for  treatment. Nasal washings and aspirates are unacceptable for Xpert Xpress SARS-CoV-2/FLU/RSV testing.  Fact Sheet for Patients: BloggerCourse.com  Fact Sheet for Healthcare Providers: SeriousBroker.it  This test is not yet approved or cleared by the [place] FDA and has been authorized for detection and/or diagnosis of SARS-CoV-2 by FDA under an Emergency Use Authorization (EUA). This EUA will remain in effect (meaning this test can be used) for the duration of the COVID-19 declaration under Section 564(b)(1) of the Act, 21 U.S.C. section 360bbb-3(b)(1), unless the authorization is terminated or revoked.  Performed at [facility], [address] [place], [place] [address]       Radiology Studies: CT ANGIO HEAD NECK W WO CM  Result Date: 05/25/2020 CLINICAL DATA:  Initial evaluation for focal neural deficit, stroke suspected. EXAM: CT ANGIOGRAPHY HEAD AND NECK TECHNIQUE: Multidetector CT imaging of the head and neck was performed using the standard protocol during bolus administration of intravenous contrast. Multiplanar CT image reconstructions and MIPs were obtained to evaluate the vascular anatomy. Carotid stenosis measurements (when applicable) are obtained utilizing NASCET criteria, using the distal internal carotid diameter as the denominator. CONTRAST:  4mL OMNIPAQUE IOHEXOL 350 MG/ML SOLN COMPARISON:  Prior CT from 07/19/2019 and MRA from 07/20/2019. FINDINGS: CT HEAD FINDINGS Brain: Generalized age-related cerebral atrophy with chronic microvascular ischemic disease. Remote lacunar infarct at the right basal ganglia with associated wallerian degeneration. Chronic encephalomalacia of with dystrophic calcification involving the left temporoparietal region again noted. Associated ex vacuo dilatation of the left lateral ventricle. No acute intracranial hemorrhage. No acute large vessel territory infarct. No mass lesion,  mass effect or midline shift. No extra-axial fluid collection. Ventricles stable in size without hydrocephalus. Vascular: No hyperdense vessel. Scattered vascular calcifications noted within the carotid siphons. Skull: Scalp soft tissues and calvarium within normal limits. Sinuses: Mild scattered mucosal thickening noted within the ethmoidal air cells. Paranasal sinuses are otherwise largely clear. Trace left mastoid effusion noted, no full significance. Orbits: Left gaze noted. Globes and orbital soft tissues demonstrate no other acute finding. Review of the MIP images confirms the above findings CTA NECK FINDINGS Aortic arch: Visualized aortic arch normal caliber with normal branch pattern. Mild atheromatous change about the aortic arch and origin of the great vessels without hemodynamically significant stenosis. Right carotid system: Right CCA patent from its origin to the bifurcation without stenosis. Bulky calcified plaque about the right carotid bulb/proximal right ICA with associated stenosis of up to 50% by NASCET criteria. Right  ICA patent distally without stenosis, dissection or occlusion. Left carotid system: Left CCA patent from its origin to the bifurcation without stenosis. Bulky calcified plaque about the left carotid bulb with no more than mild 25% stenosis by NASCET criteria. Left ICA patent distally without stenosis, dissection or occlusion. Vertebral arteries: Left vertebral artery arises directly from the aortic arch. Calcified plaque at the origins of both vertebral arteries with estimated moderate approximate 50% stenoses bilaterally. Vertebral arteries mildly tortuous but otherwise widely patent within the neck without stenosis, dissection or occlusion. Skeleton: No visible acute osseous finding. No discrete or worrisome osseous lesions. Other neck: No other acute soft tissue abnormality within the neck. No mass or adenopathy. Upper chest: Patchy and hazy opacity seen within the partially  visualized right upper lobe, which could reflect atelectasis and/or infiltrate. Layering secretions noted within the subglottic trachea at the level of the carina with extension into the proximal right mainstem bronchus. Visualized upper chest demonstrates no other acute finding. Review of the MIP images confirms the above findings CTA HEAD FINDINGS Anterior circulation: Petrous segments patent bilaterally. Scattered atheromatous change within the carotid siphons bilaterally, left worse than right. Associated moderate stenosis at the supraclinoid left ICA. A1 segments patent bilaterally. Right A1 is partially fenestrated distally. Normal anterior communicating artery complex. Anterior cerebral arteries patent to their distal aspects without stenosis. No M1 stenosis or occlusion. Normal MCA bifurcations. Distal MCA branches well perfused and symmetric. Posterior circulation: Focal calcified plaque noted within the proximal left V4 segment without significant stenosis. Vertebral arteries otherwise widely patent to the vertebrobasilar junction. Left PICA origin patent and normal. Right PICA not seen. Basilar patent to its distal aspect without stenosis. Superior cerebral arteries patent bilaterally. Left PCA supplied via the basilar. Fetal type origin of the right PCA. Both PCAs well perfused or distal aspects. Venous sinuses: Grossly patent allowing for timing the contrast bolus. Anatomic variants: Fetal type origin of the right PCA.  No aneurysm. Review of the MIP images confirms the above findings IMPRESSION: CT HEAD IMPRESSION: 1. No acute intracranial abnormality. 2. Chronic left temporoparietal and right basal ganglia infarcts. 3. Underlying atrophy with chronic microvascular ischemic disease. CTA HEAD AND NECK IMPRESSION: 1. Negative CTA for large vessel occlusion. 2. Atheromatous change about the carotid bifurcations with up to 50% stenosis on the right. 3. Moderate atheromatous stenosis at the supraclinoid  left ICA. 4. Moderate estimated 50% stenoses at the origins of both vertebral arteries. 5. Hazy and patchy opacity within the posterior right upper lobe, which could reflect atelectasis or infiltrate. Layering secretions/debris seen within the subglottic trachea and right mainstem bronchus, raising the possibility for aspiration. CT PERFUSION IMPRESSION: Electronically Signed   By: Rise Mu M.D.   On: 05/25/2020 22:14   DG Chest Portable 1 View  Result Date: 05/25/2020 CLINICAL DATA:  65 year old male with cough. EXAM: PORTABLE CHEST 1 VIEW COMPARISON:  Chest radiograph dated 10/09/2019. FINDINGS: No focal consolidation, pleural effusion, pneumothorax. Stable cardiomegaly. Median sternotomy wires and CABG vascular clips. No acute osseous pathology. IMPRESSION: No active cardiopulmonary disease. Electronically Signed   By: Elgie Collard M.D.   On: 05/25/2020 20:44   Overnight EEG with video  Result Date: 05/26/2020 [name], MD     05/27/2020  9:31 AM Patient Name: [name] Cruz MRN: [id number] Epilepsy Attending: [name] Referring Physician/Provider: Dr. Onalee Hua Duration: 05/25/2020 2138 to 05/26/2020 1825 Patient history:  65 year old male with a history of seizures with breakthrough seizures.  EEG to evaluate  for seizures. Level of alertness: Awake, asleep AEDs during EEG study: Keppra, Vimpat, Depakote Technical aspects: This EEG study was done with scalp electrodes positioned according to the 10-20 International system of electrode placement. Electrical activity was acquired at a sampling rate of 500Hz  and reviewed with a high frequency filter of 70Hz  and a low frequency filter of 1Hz . EEG data were recorded continuously and digitally stored. Description: The posterior dominant rhythm consists of 9 Hz activity of moderate voltage (25-35 uV) seen predominantly in posterior head regions, symmetric and reactive to eye opening and eye closing. Sleep was characterized  by vertex waves, sleep spindles (12 to 14 Hz), maximal frontocentral region. EEG showed continuous  3 to 6 Hz theta-delta slowing in right hemisphere. Sharp waves are also noted in right frontotemporal region. Intermittent rhythmic 3 to 5 Hz theta-delta slowing was seen in left temporal region. Intermittent generalized 3 to 5 Hz theta-delta slowing was also noted. Four episodes were noted on 05/26/2020 at 0424, 0521, 0707 and 0943 during which patient was noted to be laying in bed, looking towards right. Concomitant eeg showed 6-7hz  sharply contoured theta slowing in right frontotemporal region which gradually evolved into 2-3Hz  delta slowing and spread to involve right hemisphere consistent with seizure. Duration of seizures was about 45 seconds to 1.5 mins Hyperventilation and photic stimulation were not performed.  Study was not interpretable after 1825 as patient pulled his electrodes.  ABNORMALITY - Seizure,right frontotemporal region -Sharp wave, right frontotemporal region - Continuous slow, right hemisphere - Intermittent rhythmic slow, left temporal region - Intermittent slow, generalized IMPRESSION: This study showed four seizures on 05/26/2020 at 0424, 0521, 0707 and 0943 during which patient was noted to be laying in bed, looking towards right, arising from right frontotemporal region, average duration 45 seconds to 1.5 mins. There is evidence of epileptogenicity and cortical dysfunction in right hemisphere likely secondary to underlying structural abnormality.  Additionally, there is nonspecific cortical dysfunction in left temporal region as well as mild diffuse encephalopathy, nonspecific etiology but likely secondary to ictal-postictal state.  Priyanka [id number]       LOS: 1 day   Airanna Partin 05/28/2020 on [email or web address]  05/27/2020, 10:04 AM

## 2020-05-27 NOTE — Progress Notes (Signed)
EEG rehooked, no skin break seen.

## 2020-05-27 NOTE — Evaluation (Signed)
Physical Therapy Evaluation Patient Details Name: [name] Cruz MRN: [id number] DOB: Aug 02, 1955 Today's Date: 05/27/2020   History of Present Illness  65 y.o. male with known history of stroke, seizures. Admitted Status epilepticus. Found to have aspiration pneumonia after szr. Hx Lt sided weakness from prior CVA.  Clinical Impression  Pt admitted with above diagnosis. Presents with Rt wrist and hand tremor, fairly constant throughout therapy session. Lt sided weakness UE>LE. Able to lift LLE against gravity in limited range. Max assist for bed mobility, sat EOB 5 min, brief periods unsupported with Rt and posterior lean present. Participated in reaching and tracking tasks, following one step commands <50% of the time. Oriented to person, DOB, able to identify location is [facility] with cues for scanning room, states he is admitted due to stroke - reoriented to date and situation. Perseverating on a cup of water (RN notified.) Attempted to stand but unable at this time. May benefit most from CIR.  Poor historian and family not immediately available for PLOF PTA. Pt currently with functional limitations due to the deficits listed below (see PT Problem List). Pt will benefit from skilled PT to increase their independence and safety with mobility to allow discharge to the venue listed below.       Follow Up Recommendations CIR    Equipment Recommendations   (TBD)    Recommendations for Other Services OT consult;Rehab consult     Precautions / Restrictions Precautions Precautions: Fall;Other (comment) (Seizure) Restrictions Weight Bearing Restrictions: No      Mobility  Bed Mobility Overal bed mobility: Needs Assistance Bed Mobility: Sidelying to Sit;Sit to Sidelying   Sidelying to sit: Max assist;HOB elevated     Sit to sidelying: Max assist General bed mobility comments: Max A for supine to sit and back into bed. Pt following commands intermittently with tactile and verbal cues for  sequencing. able to initiate LLE to EOB with tactile cues, however requires BIL support in and out of bed, with strong trunk support. Pt pulling through RUE, but with inappropriate direction and force to rise to EOB.    Transfers Overall transfer level: Needs assistance   Transfers: Sit to/from Stand Sit to Stand: Total assist         General transfer comment: attempted sit<>stand from bed, however increased tone through LLE and pushing posteriorly resulting in unsafe position to fully rise at this time. Unable to fully clear buttocks from bed but shows initiation with cues.  Ambulation/Gait                Stairs            Wheelchair Mobility    Modified Rankin (Stroke Patients Only)       Balance Overall balance assessment: Needs assistance Sitting-balance support: Single extremity supported;Feet supported Sitting balance-Leahy Scale: Poor Sitting balance - Comments: Sat EOB approx 5 min, attempting weight shift and following commands such as tracking and reaching. Holds self upright for short periods of time however intermittent assist required due to posterior and Rt lean. Postural control: Right lateral lean;Posterior lean                                   Pertinent Vitals/Pain Pain Assessment: Faces Faces Pain Scale: No hurt Pain Intervention(s): Monitored during session;Repositioned    Home Living Family/patient expects to be discharged to:: Unsure  Additional Comments:  (Family not immediately available in room - pt is poor historian.)    Prior Function           Comments:  (From previous admission - pt was independent with mobility in July 2021, able to mow his yard however sister assisted with bills and medications.)     Hand Dominance   Dominant Hand: Right    Extremity/Trunk Assessment   Upper Extremity Assessment Upper Extremity Assessment: Defer to OT evaluation (Rt wrist and hand tremors.)     Lower Extremity Assessment Lower Extremity Assessment: LLE deficits/detail (Clonus noted bil ankles with quick DF ankle jerk) LLE Deficits / Details: strong tone LLE: Unable to fully assess due to immobilization       Communication   Communication: Expressive difficulties  Cognition Arousal/Alertness: Awake/alert Behavior During Therapy: Impulsive Overall Cognitive Status: Impaired/Different from baseline Area of Impairment: Orientation;Following commands;Safety/judgement;Problem solving                 Orientation Level: Disoriented to;Time;Situation (aware of name and DOB - looks around room and states he is at [facility]. Reports he lives with his mother but chart indicates sister.)     Following Commands: Follows one step commands inconsistently;Follows one step commands with increased time Safety/Judgement: Decreased awareness of safety;Decreased awareness of deficits   Problem Solving: Slow processing;Decreased initiation;Difficulty sequencing;Requires verbal cues;Requires tactile cues        General Comments General comments (skin integrity, edema, etc.): Rt hand and wrist tremors noted. HR 110 at rest. Requesting water frequently (RN notified.)    Exercises     Assessment/Plan    PT Assessment Patient needs continued PT services  PT Problem List Decreased strength;Decreased range of motion;Decreased activity tolerance;Decreased balance;Decreased mobility;Decreased coordination;Decreased cognition;Decreased knowledge of use of DME;Decreased safety awareness;Decreased knowledge of precautions;Impaired tone       PT Treatment Interventions DME instruction;Gait training;Functional mobility training;Therapeutic activities;Therapeutic exercise;Balance training;Neuromuscular re-education;Cognitive remediation;Patient/family education;Wheelchair mobility training    PT Goals (Current goals can be found in the Care Plan section)  Acute Rehab PT Goals Patient Stated Goal:  none stated PT Goal Formulation: Patient unable to participate in goal setting Time For Goal Achievement: 06/10/20 Potential to Achieve Goals: Good    Frequency Min 4X/week   Barriers to discharge   May require significant assistance for mobility, TBD - pending progress.    Co-evaluation               AM-PAC PT "6 Clicks" Mobility  Outcome Measure Help needed turning from your back to your side while in a flat bed without using bedrails?: A Lot Help needed moving from lying on your back to sitting on the side of a flat bed without using bedrails?: A Lot Help needed moving to and from a bed to a chair (including a wheelchair)?: Total Help needed standing up from a chair using your arms (e.g., wheelchair or bedside chair)?: Total Help needed to walk in hospital room?: Total Help needed climbing 3-5 steps with a railing? : Total 6 Click Score: 8    End of Session   Activity Tolerance: Treatment limited secondary to medical complications (Comment) (EEG on at this time, did not attempt transfer to chair or ambulation. Limited by confusion.) Patient left: in bed;with call bell/phone within reach;with bed alarm set;Other (comment) (szr precautions in place) Nurse Communication: Mobility status;Other (comment) (tremor Rt hand and wrist, requesting water frequently) PT Visit Diagnosis: Difficulty in walking, not elsewhere classified (R26.2);Other symptoms and signs involving the  nervous system (R29.898);Hemiplegia and hemiparesis Hemiplegia - Right/Left: Left Hemiplegia - dominant/non-dominant: Non-dominant Hemiplegia - caused by:  (Hx of CVA)    Time: 1937-9024 PT Time Calculation (min) (ACUTE ONLY): 22 min   Charges:   PT Evaluation $PT Eval Moderate Complexity: 1 Mod          Venus Gilles Benancio Deeds, PT, DPT  Berton Mount 05/27/2020, 11:46 AM

## 2020-05-27 NOTE — Evaluation (Signed)
Clinical/Bedside Swallow Evaluation Patient Details  Name: [name] Cruz MRN: [id number] Date of Birth: [birth year]  Today's Date: 05/27/2020 Time: SLP Start Time (ACUTE ONLY): 1225 SLP Stop Time (ACUTE ONLY): 1240 SLP Time Calculation (min) (ACUTE ONLY): 15 min  Past Medical History:  Past Medical History:  Diagnosis Date  . Acute deep vein thrombosis (DVT) of left upper extremity (HCC)    L brachial and basilic veins, eliquis started 08/22/2017 to continue for 3 months total   . Cognitive impairment 2007   after stroke, saw rehab but told to stop because was too upsetting to him  . History of chicken pox   . HLD (hyperlipidemia)   . HTN (hypertension)   . NSTEMI (non-ST elevated myocardial infarction) (HCC) 02/21/2017  . Obesity   . Stroke, hemorrhagic (HCC) 2007   thought 2/2 HTN (240sbp); residual cognitive impairment, loss of R peripheral field, no driving   Past Surgical History:  Past Surgical History:  Procedure Laterality Date  . ANKLE SURGERY  1990s   right foot with plate and screws  . CORONARY ARTERY BYPASS GRAFT N/A 02/24/2017   3v Procedure: CORONARY ARTERY BYPASS GRAFTING (CABG) x 3 ON PUMP USING LEFT INTERNAL MAMMARY ARTERY TO LEFT ANTERIOR DESENDING CORNARY ARTERY, RIGHT GREATER SAPHENOUS VEIN TO LEFT CIRCUMFLEX ARTERY AND POSTERIOR DESENDING ARTERY. RIGHT GREATER SAPHENOUS VEIN OBTAINED VIA ENDOVEIN HARVEST.;  Surgeon: Delight Ovens, MD  . IABP INSERTION N/A 02/21/2017   Procedure: IABP Insertion;  Surgeon: Yvonne Kendall, MD;  Location: [facility] INVASIVE CV [facility];  Service: Cardiovascular;  Laterality: N/A;  . LEFT HEART CATH AND CORONARY ANGIOGRAPHY N/A 02/21/2017   Procedure: LEFT HEART CATH AND CORONARY ANGIOGRAPHY;  Surgeon: Yvonne Kendall, MD;  Location: [facility] INVASIVE CV [facility];  Service: Cardiovascular;  Laterality: N/A;  . TEE WITHOUT CARDIOVERSION N/A 02/24/2017   Procedure: TRANSESOPHAGEAL ECHOCARDIOGRAM (TEE);  Surgeon: Delight Ovens, MD;  Location: [facility] OR;   Service: Open Heart Surgery;  Laterality: N/A;   HPI:  65 y.o. male admitted from home with abnormal movement and confusion, brought to ED with concern for seizure, became unresponsive and found to be in status epilepticus. Concern for aspiration event.  Hx strokes in 2020 and 2021, + seizure disorder. Pt has been seen by SLP during prior admissions.  He was followed for intensive SLP intervention during CIR admission July of 2021 and has had multiple prior swallowing evals during prior admissions (both clinical and instrumental).  Documentation review revealed improved swallowing at time of D/C (dys3/thin recommended).  Pt's communication was c/b repetitive phrases like  "I understand" and anomia with short sentences.   Assessment / Plan / Recommendation Clinical Impression  Pt presented with quite functional swallowing - he demonstrated good oral attention, adequate mastication of solids and control of liquids with intact sensation on left. He consumed consecutive straw sips of water (>3 oz) with no s/s of aspiration and mixed solid/liquid boluses with no difficulties. He self-fed pudding with assist to stabilize container while he handled spoon with RUE.  He tended to perseverate on "ok" and "I understand," (this appears to be c/w baseline communication from chart review). He was able to generate novel sentences and make basic requests; had difficulty with higher-level language expression. Recommend resuming a PO diet - if he had an aspiration event, it does not appear to be a recurring concern.  Will start a regular diet/thin liquids; he will need assist with self-feeding. SLP Visit Diagnosis: Dysphagia, oropharyngeal phase (R13.12)    Aspiration Risk  low   Diet Recommendation   regular solids, thin liquids  Medication Administration: Whole meds with puree    Other  Recommendations Oral Care Recommendations: Oral care BID   Follow up Recommendations  (tba)      Frequency and Duration min  2x/week  1 week       Prognosis        Swallow Study   General Date of Onset: 05/25/20 HPI: 66 y.o. male admitted from home with abnormal movement and confusion, brought to ED with concern for seizure, became unresponsive and found to be in status epilepticus. Concern for aspiration event.  Hx strokes in 2020 and 2021, + seizure disorder. Pt has been seen by SLP during prior admissions.  He was followed for intensive SLP intervention during CIR admission July of 2021 and has had multiple prior swallowing evals during prior admissions (both clinical and instrumental).  Documentation review revealed improved swallowing at time of D/C (dys3/thin recommended).  Pt's communication was c/b repetitive phrases like  "I understand" and anomia with short sentences. Type of Study: Bedside Swallow Evaluation Previous Swallow Assessment: see HPI Diet Prior to this Study: NPO Temperature Spikes Noted: No Respiratory Status: Room air History of Recent Intubation: No Behavior/Cognition: Alert;Cooperative Oral Cavity Assessment: Within Functional Limits Oral Care Completed by SLP: Recent completion by staff Vision: Functional for self-feeding Self-Feeding Abilities: Needs assist Patient Positioning: Upright in bed Baseline Vocal Quality: Normal Volitional Cough: Strong Volitional Swallow: Able to elicit    Oral/Motor/Sensory Function Overall Oral Motor/Sensory Function: Mild impairment Facial ROM: Reduced left;Suspected CN VII (facial) dysfunction Facial Symmetry: Abnormal symmetry left;Suspected CN VII (facial) dysfunction Facial Strength: Reduced left;Suspected CN VII (facial) dysfunction Facial Sensation: Within Functional Limits Lingual ROM: Reduced left Lingual Symmetry: Abnormal symmetry left;Suspected CN XII (hypoglossal) dysfunction Lingual Sensation: Within Functional Limits   Ice Chips Ice chips: Within functional limits   Thin Liquid Thin Liquid: Within functional limits    Nectar  Thick Nectar Thick Liquid: Not tested   Honey Thick Honey Thick Liquid: Not tested   Puree Puree: Within functional limits   Solid     Solid: Within functional limits      Blenda Mounts Laurice 05/27/2020,12:51 PM  Marchelle Folks L. Samson Frederic, MA CCC/SLP Acute Rehabilitation Services Office number [phone] Pager [phone]

## 2020-05-28 DIAGNOSIS — J690 Pneumonitis due to inhalation of food and vomit: Secondary | ICD-10-CM

## 2020-05-28 LAB — CBC
HCT: 35.8 % — ABNORMAL LOW (ref 39.0–52.0)
Hemoglobin: 12.5 g/dL — ABNORMAL LOW (ref 13.0–17.0)
MCH: 32.2 pg (ref 26.0–34.0)
MCHC: 34.9 g/dL (ref 30.0–36.0)
MCV: 92.3 fL (ref 80.0–100.0)
Platelets: 195 10*3/uL (ref 150–400)
RBC: 3.88 MIL/uL — ABNORMAL LOW (ref 4.22–5.81)
RDW: 12.9 % (ref 11.5–15.5)
WBC: 9.7 10*3/uL (ref 4.0–10.5)
nRBC: 0 % (ref 0.0–0.2)

## 2020-05-28 LAB — GLUCOSE, CAPILLARY
Glucose-Capillary: 100 mg/dL — ABNORMAL HIGH (ref 70–99)
Glucose-Capillary: 102 mg/dL — ABNORMAL HIGH (ref 70–99)
Glucose-Capillary: 113 mg/dL — ABNORMAL HIGH (ref 70–99)
Glucose-Capillary: 123 mg/dL — ABNORMAL HIGH (ref 70–99)
Glucose-Capillary: 128 mg/dL — ABNORMAL HIGH (ref 70–99)
Glucose-Capillary: 87 mg/dL (ref 70–99)
Glucose-Capillary: 93 mg/dL (ref 70–99)

## 2020-05-28 LAB — BASIC METABOLIC PANEL
Anion gap: 8 (ref 5–15)
BUN: 6 mg/dL — ABNORMAL LOW (ref 8–23)
CO2: 25 mmol/L (ref 22–32)
Calcium: 8.9 mg/dL (ref 8.9–10.3)
Chloride: 99 mmol/L (ref 98–111)
Creatinine, Ser: 0.44 mg/dL — ABNORMAL LOW (ref 0.61–1.24)
GFR, Estimated: >60 mL/min (ref >60)
Glucose, Bld: 94 mg/dL (ref 70–99)
Potassium: 3.8 mmol/L (ref 3.5–5.1)
Sodium: 132 mmol/L — ABNORMAL LOW (ref 135–145)

## 2020-05-28 LAB — MAGNESIUM: Magnesium: 1.7 mg/dL (ref 1.7–2.4)

## 2020-05-28 MED ORDER — MAGNESIUM SULFATE 2 GM/50ML IV SOLN
2.0000 g | Freq: Once | INTRAVENOUS | Status: AC
  Administered 2020-05-28: 2 g via INTRAVENOUS
  Filled 2020-05-28: qty 50

## 2020-05-28 NOTE — Progress Notes (Signed)
Inpatient Rehab Admissions Coordinator Note:   Per PT recommendation, pt was screened for CIR candidacy by Wolfgang Phoenix, MS,CCC=SLP.  At this time we are not recommending an inpatient rehab consult. With current dx of seizures, it is unlikely pt's insurance would approve an inpatient rehab stay.   Please contact me with questions.     Wolfgang Phoenix, MS, CCC-SLP Admissions Coordinator [phone] 05/28/20 11:19 AM

## 2020-05-28 NOTE — Evaluation (Signed)
Occupational Therapy Evaluation Patient Details Name: SANKALP FERRELL MRN: [id number] DOB: 1956/01/13 Today's Date: 05/28/2020    History of Present Illness 65 y.o. male with known history of stroke, seizures. Admitted Status epilepticus. Found to have aspiration pneumonia after szr. Hx Lt sided weakness from prior CVA.   Clinical Impression   Pt is a poor historian due to cognitive limitations, however he reports PTA he was living at home with his sister, and was independent with ADL/IADL and functional mobility. Pt currently requires mutlimodal cues for 1 step commands during sequencing of bed mobility and ADL completion. Pt requires maxA for rolling in bed, further mobility was deferred secondary to cognitive limitations, pt increased need for physical assistance and pt falling asleep mid session.  Due to decline in current level of function, pt would benefit from acute OT to address established goals to facilitate safe D/C to venue listed below. At this time, recommend CIR follow-up. Will continue to follow acutely.     Follow Up Recommendations  CIR    Equipment Recommendations  3 in 1 bedside commode    Recommendations for Other Services       Precautions / Restrictions Precautions Precautions: Fall;Other (comment) (Seizure) Restrictions Weight Bearing Restrictions: No      Mobility Bed Mobility Overal bed mobility: Needs Assistance Bed Mobility: Rolling Rolling: Max assist         General bed mobility comments: maxA to roll to the left, pt reaching for bed rails. Pt dependent with rolling to the right due to LUE limitations. Pt required totalA+2 to reposition higher in bed.    Transfers                 General transfer comment: did not progress mobility this session secondary to poor command following, increased need for 2 person assistance, pt falling asleep during the middle of today's session.    Balance       Sitting balance - Comments: did not  assess, pt supported in bed level with HOB elevated, pt leaning toward right side of bed Postural control: Right lateral lean                                 ADL either performed or assessed with clinical judgement   ADL Overall ADL's : Needs assistance/impaired                                       General ADL Comments: maximum to total assistance for all ADL. Per speech orders, pt requires assistance with feeding as well. Pt requires multimodal cues for sequencing     Vision   Vision Assessment?: Vision impaired- to be further tested in functional context Additional Comments: difficulty assessing secondary to cognitive limitations, pt able to focus on items in left and right fields, unable to complete horizontal pursuits at this time. Will continue to assess     Perception     Praxis      Pertinent Vitals/Pain Pain Assessment: No/denies pain Faces Pain Scale: No hurt Pain Intervention(s): Monitored during session     Hand Dominance Right   Extremity/Trunk Assessment Upper Extremity Assessment Upper Extremity Assessment: LUE deficits/detail;RUE deficits/detail RUE Deficits / Details: WFL LUE Deficits / Details: hemiplegia at baseline, elbow contracture and shoulder contracture, no grip strength, digit 3 with PIP in hyperextension, other digits  in slight flexion. Pt with no functional use of LUE   Lower Extremity Assessment Lower Extremity Assessment: Defer to PT evaluation LLE Deficits / Details: strong tone, unable to progress knee into flexion LLE: Unable to fully assess due to immobilization       Communication Communication Communication: Expressive difficulties   Cognition Arousal/Alertness: Awake/alert (initially but falling asleep middle of session) Behavior During Therapy: Impulsive Overall Cognitive Status: Impaired/Different from baseline Area of Impairment: Orientation;Following commands;Safety/judgement;Problem  solving;Attention;Memory;Awareness                 Orientation Level: Disoriented to;Time;Situation Current Attention Level: Focused Memory: Decreased short-term memory Following Commands: Follows one step commands inconsistently;Follows one step commands with increased time Safety/Judgement: Decreased awareness of safety;Decreased awareness of deficits Awareness: Intellectual Problem Solving: Slow processing;Decreased initiation;Difficulty sequencing;Requires verbal cues;Requires tactile cues General Comments: pt not following commands consistently during session, reponds appropriately to commands 50% of the time. Pt requires multimodal cues and step by step instructions for sequencing. Pt with decreased attention span.   General Comments  right hand wrist tremors noted;HR 90s at rest    Exercises     Shoulder Instructions      Home Living Family/patient expects to be discharged to:: Unsure                                 Additional Comments: pt reports he lives with his sister in a 1 story house with 1step to enter, reports he has a tub shower. Unsure accuracy of report secondary to cognitive limiations (Family not immediately available in room - pt is poor historian.)      Prior Functioning/Environment Level of Independence: Independent        Comments:  (From previous admission - pt was independent with mobility in July 2021, able to mow his yard however sister assisted with bills and medications.)        OT Problem List: Decreased strength;Decreased range of motion;Decreased activity tolerance;Decreased cognition;Decreased safety awareness      OT Treatment/Interventions: Self-care/ADL training;Therapeutic exercise;Energy conservation;DME and/or AE instruction;Therapeutic activities;Cognitive remediation/compensation;Patient/family education;Balance training    OT Goals(Current goals can be found in the care plan section) Acute Rehab OT Goals Patient  Stated Goal: none stated OT Goal Formulation: With patient Time For Goal Achievement: 06/11/20 Potential to Achieve Goals: Good ADL Goals Pt Will Perform Grooming: standing;with min guard assist Pt Will Perform Lower Body Dressing: sit to/from stand;with min guard assist Pt Will Transfer to Toilet: with min guard assist;stand pivot transfer Additional ADL Goal #1: Pt will demonstrate anticipatory awareness during ADL/IADL and functional mobility.  OT Frequency: Min 2X/week   Barriers to D/C:            Co-evaluation              AM-PAC OT "6 Clicks" Daily Activity     Outcome Measure Help from another person eating meals?: A Lot Help from another person taking care of personal grooming?: A Lot Help from another person toileting, which includes using toliet, bedpan, or urinal?: Total Help from another person bathing (including washing, rinsing, drying)?: Total Help from another person to put on and taking off regular upper body clothing?: Total Help from another person to put on and taking off regular lower body clothing?: Total 6 Click Score: 8   End of Session Nurse Communication: Mobility status  Activity Tolerance: Patient tolerated treatment well Patient left: in bed;with call  bell/phone within reach;with bed alarm set (with mitt on per RN request)  OT Visit Diagnosis: Unsteadiness on feet (R26.81);Other abnormalities of gait and mobility (R26.89);Muscle weakness (generalized) (M62.81);Other symptoms and signs involving cognitive function;Hemiplegia and hemiparesis Hemiplegia - Right/Left: Left Hemiplegia - dominant/non-dominant: Non-Dominant Hemiplegia - caused by: Cerebral infarction                Time: 1220-1241 OT Time Calculation (min): 21 min Charges:  OT General Charges $OT Visit: 1 Visit OT Evaluation $OT Eval Moderate Complexity: 1 Mod  Rhylen Shaheen OTR/L Acute Rehabilitation Services Office: [phone]   Rebeca Alert 05/28/2020, 12:55 PM

## 2020-05-28 NOTE — Procedures (Addendum)
Patient Name: [name] Cruz  MRN: [id number]  Epilepsy Attending: [name]  Referring Physician/Provider: Dr. Onalee Hua Duration: 05/27/2020 0092 to 05/28/2020 3300  Patient history: 65 year old male with a history of seizures with breakthrough seizures.  EEG to evaluate for seizures.  Level of alertness: Awake, asleep  AEDs during EEG study: Keppra, Vimpat, Depakote  Technical aspects: This EEG study was done with scalp electrodes positioned according to the 10-20 International system of electrode placement. Electrical activity was acquired at a sampling rate of 500Hz  and reviewed with a high frequency filter of 70Hz  and a low frequency filter of 1Hz . EEG data were recorded continuously and digitally stored.   Description: The posterior dominant rhythm consists of 9 Hz activity of moderate voltage (25-35 uV) seen predominantly in posterior head regions, symmetric and reactive to eye opening and eye closing. Sleep was characterized by vertex waves, sleep spindles (12 to 14 Hz), maximal frontocentral region. EEG showed continuous  3 to 6 Hz theta-delta slowing in right hemisphere. Sharp waves are also noted in right frontotemporal region. Intermittent generalized 3 to 5 Hz theta-delta slowing was also noted. Hyperventilation and photic stimulation were not performed.    ABNORMALITY -Sharp wave, right frontotemporal region - Continuous slow, right hemisphere - Intermittent slow, generalized   IMPRESSION: This study showed evidence of epileptogenicity and cortical dysfunction in right hemisphere likely secondary to underlying structural abnormality. Additionally, there is mild diffuse encephalopathy, nonspecific etiology. No seizures were seen during this study.  Cejay Cambre 

## 2020-05-28 NOTE — Procedures (Signed)
Patient Name:[name] Cruz DAILY CRATE YPP:[id number] Epilepsy Attending:Zion Ta Annabelle Harman Referring Physician/Provider:Dr. Onalee Hua Duration:05/28/2020 [phone] to 5/15/20221100  Patient history:65 year old male with a history of seizures with breakthrough seizures.EEG to evaluate for seizures.  Level of alertness:Awake, asleep  AEDs during EEG study:Keppra, Vimpat, Depakote  Technical aspects: This EEG study was done with scalp electrodes positioned according to the 10-20 International system of electrode placement. Electrical activity was acquired at a sampling rate of 500Hz  and reviewed with a high frequency filter of 70Hz  and a low frequency filter of 1Hz . EEG data were recorded continuously and digitally stored.   Description: The posterior dominant rhythm consists of 9 Hz activity of moderate voltage (25-35 uV) seen predominantly in posterior head regions, symmetric and reactive to eye opening and eye closing. Sleep was characterized by vertex waves, sleep spindles (12 to 14 Hz), maximal frontocentral region. EEG showedcontinuous 3 to 6 Hz theta-delta slowingin right hemisphere.Sharp waves are also noted in right frontotemporal region. Intermittent generalized 3 to 5 Hz theta-delta slowing wasalsonoted. Hyperventilation and photic stimulation were not performed.  ABNORMALITY -Sharp wave, right frontotemporal region -Continuous slow, right hemisphere - Intermittent slow,generalized   IMPRESSION: This study showedevidence of epileptogenicity and cortical dysfunction in right hemisphere likely secondary to underlying structural abnormality.Additionally, there is mild diffuse encephalopathy, nonspecific etiology. No seizures were seen during this study.  [name] 

## 2020-05-28 NOTE — Progress Notes (Signed)
Neurology Progress Note  S: No overnight events. No seizure activity noted on LTM EEG. His mental status remains slowed with on and off following of commands. He says he is "good". No seizure activity per nursing staff.   O: Current vital signs: BP 130/82 (BP Location: Left Arm)   Pulse 87   Temp 97.8 F (36.6 C) (Oral)   Resp 20   SpO2 98%  Vital signs in last 24 hours: Temp:  [97.8 F (36.6 C)-98.8 F (37.1 C)] 97.8 F (36.6 C) (05/15 1236) Pulse Rate:  [66-90] 87 (05/15 1236) Resp:  [18-20] 20 (05/15 1236) BP: (101-146)/(65-94) 130/82 (05/15 1236) SpO2:  [95 %-99 %] 98 % (05/15 1236)  GENERAL: Awake, alert in NAD HEENT: Normocephalic and atraumatic LUNGS: Normal respiratory effort.  Ext: warm Psych: smiling, calm.   NEURO:  Mental Status: AA&O to self only.  Speech/Language: speech is without aphasia or dysarthria. He does not carry on a conversation. He can not name objects. When asked to touch his nose with index finger, he grabs NP's finger.   Cranial Nerves:  II: slightly larger pupil on left, but reactive.  III, IV, VI: Eyelids elevate symmetrically.  V: Sensation is intact to light touch and symmetrical to face.  VII: Face is grossly symmetric.  VIII: hearing intact to voice. Tone is rigid in LUE. Hand contracture noted on left.  Other portions of exam are limited by not following commands.   Medications  Current Facility-Administered Medications:  .  0.45 % sodium chloride infusion, , Intravenous, Continuous, Osvaldo Shipper, MD, Last Rate: 50 mL/hr at 05/28/20 0726, Infusion Verify at 05/28/20 0726 .  acetaminophen (TYLENOL) tablet 650 mg, 650 mg, Oral, Q6H PRN **OR** acetaminophen (TYLENOL) suppository 650 mg, 650 mg, Rectal, Q6H PRN, Eduard Clos, MD .  Ampicillin-Sulbactam (UNASYN) 3 g in sodium chloride 0.9 % 100 mL IVPB, 3 g, Intravenous, Q6H, Osvaldo Shipper, MD, Last Rate: 200 mL/hr at 05/28/20 1146, 3 g at 05/28/20 1146 .  enoxaparin (LOVENOX)  injection 40 mg, 40 mg, Subcutaneous, Q24H, Eduard Clos, MD, 40 mg at 05/28/20 0933 .  lacosamide (VIMPAT) 200 mg in sodium chloride 0.9 % 25 mL IVPB, 200 mg, Intravenous, Q12H, Eduard Clos, MD, Last Rate: 90 mL/hr at 05/28/20 1038, 200 mg at 05/28/20 1038 .  levETIRAcetam (KEPPRA) IVPB 1500 mg/ 100 mL premix, 1,500 mg, Intravenous, Q12H, Eduard Clos, MD, Last Rate: 400 mL/hr at 05/28/20 0935, 1,500 mg at 05/28/20 0935 .  valproate (DEPACON) 500 mg in dextrose 5 % 50 mL IVPB, 500 mg, Intravenous, Q8H, Eduard Clos, MD, Last Rate: 55 mL/hr at 05/28/20 1310, 500 mg at 05/28/20 1310   Overnight LTM EEG.  This study showedevidence of epileptogenicity and cortical dysfunction in right hemisphere likely secondary to underlying structural abnormality.Additionally, there is mild diffuse encephalopathy, nonspecific etiology. No seizures were seen during this study.  Assessment: 65 yo cognitively impaired male with hx of prior strokes and seizure disorder. Patient presented to ED on 05/25/20 after having activity concerning for seizure at home. In ED, he was basically unresponsive and found to be in status epilepticus. He was loaded with Keppra and continued on his regular seizure regimen except for the increase in Depakote. He was later alert. He did have evidence of seizures on LTM 05/26/20, but none since then. Overnight read 05/27/20-05/28/20 negative for seizures.   Plan:  1. D/c LTM given negative read past 24 hours.  2. Continue seizure precautions. 3. When able to  safely take po, can switch his AEDs to po as follows: Depakote 500mg  po tid, Keppra 1500 mg po q12 hours, and Vimpat 200mg  po q12 hours.  4. No further workup per neurology but we will be available for questions.  Pt seen by , MSN, APN-BC/Nurse Practitioner/Neuro and later by MD. Note and plan to be edited as needed by MD.  Pager: 

## 2020-05-28 NOTE — Progress Notes (Signed)
[facility] PROGRESS NOTE   [name] Cruz QIW:[id number] DOB: 07/24/1955 DOA: 05/25/2020  PCP: [name], MD  Brief History/Interval Summary: 65 y.o. male with known history of stroke, seizures was found to have abnormal movement and confusion at home and was brought to the ER.  In the ER patient started developing abnormal movement in the left upper extremity and left-sided gaze.  Apparently was in status epilepticus for about 20 minutes. CT angiogram of the head and neck was done which did not show any large vessel obstruction but did show some signs of aspiration.  Patient was given 4 mg IV Ativan and Keppra was loaded per weight.  Neurologist at this time had EEG done and patient was started on IV Keppra Vimpat and valproate.  Valproic acid level was subtherapeutic.    Patient was hospitalized for further management.  Reason for Visit: Status epilepticus  Consultants: Neurology  Procedures: EEG, long-term monitoring  Antibiotics: Anti-infectives (From admission, onward)   Start     Dose/Rate Route Frequency Ordered Stop   05/26/20 0600  Ampicillin-Sulbactam (UNASYN) 3 g in sodium chloride 0.9 % 100 mL IVPB        3 g 200 mL/hr over 30 Minutes Intravenous Every 6 hours 05/26/20 0539        Subjective/Interval History: Patient remains confused.  More responsive but does not answer questions appropriately.      Assessment/Plan:  Status epilepticus in the setting of known seizure disorder In the ED patient was given 4 mg of Ativan and Keppra loading dose.   Patient currently on long-term monitoring.  Neurology is following and managing.   Currently noted to be on Vimpat, Keppra, valproate.    Aspiration pneumonia Crackles heard on examination.  No pneumonia noted on chest x-ray.  He likely aspirated due to seizure activity.  He was initiated on Unasyn.  Remains afebrile.  Saturating normal on room air.  We will give him a 5-day course.  Seen by speech therapy.   Started on diet.  Aspiration precautions.   Hypomagnesemia Continue to monitor periodically.  Replace as needed.  Normocytic anemia No evidence of overt bleeding.  Likely dilutional drop.  Monitor.  History of stroke with known left-sided weakness Stable.  Does not appear to have had a new event.  Neurological deficits due to seizure activity.  Patient was on aspirin and atorvastatin prior to admission.  These can be resumed when the patient is able to take orally.  Obesity Estimated body mass index is 30.58 kg/m as calculated from the following:   Height as of 03/13/20: 5\' 3"  (1.6 m).   Weight as of 10/09/19: 78.3 kg.   DVT Prophylaxis: Lovenox Code Status: Full code Family Communication: Patient's sister was updated yesterday Disposition Plan: CIR recommended by PT  Status is: Inpatient  Remains inpatient appropriate because:Altered mental status, Ongoing diagnostic testing needed not appropriate for outpatient work up and Inpatient level of care appropriate due to severity of illness   Dispo:  Patient From: Home  Planned Disposition: To be determined  Medically stable for discharge: No          Medications:  Scheduled: . enoxaparin (LOVENOX) injection  40 mg Subcutaneous Q24H   Continuous: . sodium chloride 50 mL/hr at 05/28/20 0726  . ampicillin-sulbactam (UNASYN) IV Stopped (05/28/20 0553)  . lacosamide (VIMPAT) IV Stopped (05/27/20 2129)  . levETIRAcetam Stopped (05/27/20 2125)  . valproate sodium Stopped (05/28/20 0703)   05/30/20 **OR** acetaminophen   Objective:  Vital  Signs  Vitals:   05/27/20 1939 05/27/20 2348 05/28/20 0313 05/28/20 0720  BP: (!) 144/84 117/89 101/65 126/85  Pulse: 85 90 72 66  Resp: 18 18 18 20   Temp: 98.5 F (36.9 C) 98.8 F (37.1 C) 98.5 F (36.9 C) 98.5 F (36.9 C)  TempSrc: Oral Oral Oral Oral  SpO2: 97% 95% 99% 97%    Intake/Output Summary (Last 24 hours) at 05/28/2020 [id number] Last data filed at 05/28/2020  0730 Gross per 24 hour  Intake 1716.28 ml  Output 2150 ml  Net -433.72 ml   There were no vitals filed for this visit.   General appearance: Much more responsive compared to the last 2 days.  Still confused.  Does not answer questions appropriately. Resp: Clear to auscultation bilaterally.  Normal effort Cardio: S1-S2 is normal regular.  No S3-S4.  No rubs murmurs or bruit GI: Abdomen is soft.  Nontender nondistended.  Bowel sounds are present normal.  No masses organomegaly Extremities: No edema.   Neurologic: Disoriented.  Left-sided weakness noted.     Lab Results:  Data Reviewed: I have personally reviewed following labs and imaging studies  CBC: Recent Labs  Lab 05/25/20 2012 05/26/20 0225 05/27/20 0321 05/28/20 0448  WBC 9.7 11.4* 7.0 9.7  NEUTROABS 5.3  --   --   --   HGB 14.0 12.9* 11.7* 12.5*  HCT 41.5 36.9* 34.0* 35.8*  MCV 95.2 92.9 93.7 92.3  PLT 228 211 185 195    Basic Metabolic Panel: Recent Labs  Lab 05/25/20 2012 05/26/20 0225 05/27/20 0321 05/28/20 0448  NA 132* 132* 135 132*  K 4.4 3.9 3.6 3.8  CL 98 98 102 99  CO2 27 25 25 25   GLUCOSE 111* 112* 89 94  BUN 10 8 6* 6*  CREATININE 0.67 0.55* 0.53* 0.44*  CALCIUM 8.7* 8.7* 8.4* 8.9  MG 1.9 1.6* 1.8 1.7    GFR: CrCl cannot be calculated (Unknown ideal weight.).  Liver Function Tests: Recent Labs  Lab 05/25/20 2355 05/26/20 0225  AST  --  21  ALT  --  22  ALKPHOS  --  45  BILITOT  --  0.6  PROT  --  5.8*  ALBUMIN 3.4* 3.3*    CBG: Recent Labs  Lab 05/27/20 1553 05/27/20 1936 05/28/20 0004 05/28/20 0314 05/28/20 0808  GLUCAP 108* 130* 100* 87 123*     Recent Results (from the past 240 hour(s))  Resp Panel by RT-PCR (Flu A&B, Covid) Nasopharyngeal Swab     Status: None   Collection Time: 05/25/20  8:12 PM   Specimen: Nasopharyngeal Swab; Nasopharyngeal(NP) swabs in vial transport medium  Result Value Ref Range Status   SARS Coronavirus 2 by RT PCR NEGATIVE NEGATIVE  Final    Comment: (NOTE) SARS-CoV-2 target nucleic acids are NOT DETECTED.  The SARS-CoV-2 RNA is generally detectable in upper respiratory specimens during the acute phase of infection. The lowest concentration of SARS-CoV-2 viral copies this assay can detect is 138 copies/mL. A negative result does not preclude SARS-Cov-2 infection and should not be used as the sole basis for treatment or other patient management decisions. A negative result may occur with  improper specimen collection/handling, submission of specimen other than nasopharyngeal swab, presence of viral mutation(s) within the areas targeted by this assay, and inadequate number of viral copies(<138 copies/mL). A negative result must be combined with clinical observations, patient history, and epidemiological information. The expected result is Negative.  Fact Sheet for Patients:  BloggerCourse.comhttps://www.fda.gov/media/152166/download  Fact  Sheet for Healthcare Providers:  SeriousBroker.it  This test is no t yet approved or cleared by the [place] FDA and  has been authorized for detection and/or diagnosis of SARS-CoV-2 by FDA under an Emergency Use Authorization (EUA). This EUA will remain  in effect (meaning this test can be used) for the duration of the COVID-19 declaration under Section 564(b)(1) of the Act, 21 U.S.C.section 360bbb-3(b)(1), unless the authorization is terminated  or revoked sooner.       Influenza A by PCR NEGATIVE NEGATIVE Final   Influenza B by PCR NEGATIVE NEGATIVE Final    Comment: (NOTE) The Xpert Xpress SARS-CoV-2/FLU/RSV plus assay is intended as an aid in the diagnosis of influenza from Nasopharyngeal swab specimens and should not be used as a sole basis for treatment. Nasal washings and aspirates are unacceptable for Xpert Xpress SARS-CoV-2/FLU/RSV testing.  Fact Sheet for Patients: BloggerCourse.com  Fact Sheet for Healthcare  Providers: SeriousBroker.it  This test is not yet approved or cleared by the [place] FDA and has been authorized for detection and/or diagnosis of SARS-CoV-2 by FDA under an Emergency Use Authorization (EUA). This EUA will remain in effect (meaning this test can be used) for the duration of the COVID-19 declaration under Section 564(b)(1) of the Act, 21 U.S.C. section 360bbb-3(b)(1), unless the authorization is terminated or revoked.  Performed at [facility], [address], [place], [place] [address]       Radiology Studies: Overnight EEG with video  Result Date: 05/26/2020 [name], MD     05/27/2020  9:31 AM Patient Name: [name] Cruz MRN: [id number] Epilepsy Attending: [name] Referring Physician/Provider: Dr. Onalee Hua Duration: 05/25/2020 2138 to 05/26/2020 1825 Patient history:  65 year old male with a history of seizures with breakthrough seizures.  EEG to evaluate for seizures. Level of alertness: Awake, asleep AEDs during EEG study: Keppra, Vimpat, Depakote Technical aspects: This EEG study was done with scalp electrodes positioned according to the 10-20 International system of electrode placement. Electrical activity was acquired at a sampling rate of 500Hz  and reviewed with a high frequency filter of 70Hz  and a low frequency filter of 1Hz . EEG data were recorded continuously and digitally stored. Description: The posterior dominant rhythm consists of 9 Hz activity of moderate voltage (25-35 uV) seen predominantly in posterior head regions, symmetric and reactive to eye opening and eye closing. Sleep was characterized by vertex waves, sleep spindles (12 to 14 Hz), maximal frontocentral region. EEG showed continuous  3 to 6 Hz theta-delta slowing in right hemisphere. Sharp waves are also noted in right frontotemporal region. Intermittent rhythmic 3 to 5 Hz theta-delta slowing was seen in left temporal region. Intermittent  generalized 3 to 5 Hz theta-delta slowing was also noted. Four episodes were noted on 05/26/2020 at 0424, 0521, 0707 and 0943 during which patient was noted to be laying in bed, looking towards right. Concomitant eeg showed 6-7hz  sharply contoured theta slowing in right frontotemporal region which gradually evolved into 2-3Hz  delta slowing and spread to involve right hemisphere consistent with seizure. Duration of seizures was about 45 seconds to 1.5 mins Hyperventilation and photic stimulation were not performed.  Study was not interpretable after 1825 as patient pulled his electrodes.  ABNORMALITY - Seizure,right frontotemporal region -Sharp wave, right frontotemporal region - Continuous slow, right hemisphere - Intermittent rhythmic slow, left temporal region - Intermittent slow, generalized IMPRESSION: This study showed four seizures on 05/26/2020 at 0424, 0521, 0707 and 0943 during which patient was noted  to be laying in bed, looking towards right, arising from right frontotemporal region, average duration 45 seconds to 1.5 mins. There is evidence of epileptogenicity and cortical dysfunction in right hemisphere likely secondary to underlying structural abnormality.  Additionally, there is nonspecific cortical dysfunction in left temporal region as well as mild diffuse encephalopathy, nonspecific etiology but likely secondary to ictal-postictal state.  Priyanka Annabelle Harman       LOS: 2 days   [employer] Pager on [email or web address]  05/28/2020, 9:27 AM

## 2020-05-28 NOTE — Progress Notes (Signed)
EEG unhook, no skin break seen. 

## 2020-05-29 LAB — CBC
HCT: 36.4 % — ABNORMAL LOW (ref 39.0–52.0)
Hemoglobin: 12.5 g/dL — ABNORMAL LOW (ref 13.0–17.0)
MCH: 32.1 pg (ref 26.0–34.0)
MCHC: 34.3 g/dL (ref 30.0–36.0)
MCV: 93.3 fL (ref 80.0–100.0)
Platelets: 190 10*3/uL (ref 150–400)
RBC: 3.9 MIL/uL — ABNORMAL LOW (ref 4.22–5.81)
RDW: 13.2 % (ref 11.5–15.5)
WBC: 6.8 10*3/uL (ref 4.0–10.5)
nRBC: 0 % (ref 0.0–0.2)

## 2020-05-29 LAB — GLUCOSE, CAPILLARY
Glucose-Capillary: 104 mg/dL — ABNORMAL HIGH (ref 70–99)
Glucose-Capillary: 99 mg/dL (ref 70–99)
Glucose-Capillary: 95 mg/dL (ref 70–99)
Glucose-Capillary: 103 mg/dL — ABNORMAL HIGH (ref 70–99)
Glucose-Capillary: 134 mg/dL — ABNORMAL HIGH (ref 70–99)

## 2020-05-29 LAB — HEPATIC FUNCTION PANEL
ALT: 21 U/L (ref 0–44)
AST: 18 U/L (ref 15–41)
Albumin: 3 g/dL — ABNORMAL LOW (ref 3.5–5.0)
Alkaline Phosphatase: 43 U/L (ref 38–126)
Bilirubin, Direct: <0.1 mg/dL (ref 0.0–0.2)
Total Bilirubin: 0.7 mg/dL (ref 0.3–1.2)
Total Protein: 5.6 g/dL — ABNORMAL LOW (ref 6.5–8.1)

## 2020-05-29 LAB — BASIC METABOLIC PANEL
Anion gap: 7 (ref 5–15)
BUN: 7 mg/dL — ABNORMAL LOW (ref 8–23)
CO2: 27 mmol/L (ref 22–32)
Calcium: 8.6 mg/dL — ABNORMAL LOW (ref 8.9–10.3)
Chloride: 102 mmol/L (ref 98–111)
Creatinine, Ser: 0.55 mg/dL — ABNORMAL LOW (ref 0.61–1.24)
GFR, Estimated: >60 mL/min (ref >60)
Glucose, Bld: 95 mg/dL (ref 70–99)
Potassium: 3.8 mmol/L (ref 3.5–5.1)
Sodium: 136 mmol/L (ref 135–145)

## 2020-05-29 LAB — VALPROIC ACID LEVEL: Valproic Acid Lvl: 68 ug/mL (ref 50.0–100.0)

## 2020-05-29 MED ORDER — LACOSAMIDE 200 MG PO TABS
200.0000 mg | ORAL_TABLET | Freq: Two times a day (BID) | ORAL | Status: DC
  Administered 2020-05-29 – 2020-05-31 (×5): 200 mg via ORAL
  Filled 2020-05-29 (×5): qty 1

## 2020-05-29 MED ORDER — DIVALPROEX SODIUM 250 MG PO DR TAB
750.0000 mg | DELAYED_RELEASE_TABLET | Freq: Two times a day (BID) | ORAL | Status: DC
  Administered 2020-05-29 – 2020-05-31 (×4): 750 mg via ORAL
  Filled 2020-05-29 (×4): qty 3

## 2020-05-29 MED ORDER — ATORVASTATIN CALCIUM 80 MG PO TABS
80.0000 mg | ORAL_TABLET | Freq: Every day | ORAL | Status: DC
  Administered 2020-05-29 – 2020-05-31 (×3): 80 mg via ORAL
  Filled 2020-05-29 (×2): qty 1
  Filled 2020-05-29: qty 2

## 2020-05-29 MED ORDER — ASPIRIN EC 81 MG PO TBEC
81.0000 mg | DELAYED_RELEASE_TABLET | Freq: Every day | ORAL | Status: DC
  Administered 2020-05-29 – 2020-05-31 (×3): 81 mg via ORAL
  Filled 2020-05-29 (×3): qty 1

## 2020-05-29 MED ORDER — LEVETIRACETAM 750 MG PO TABS
1500.0000 mg | ORAL_TABLET | Freq: Two times a day (BID) | ORAL | Status: DC
  Administered 2020-05-29 – 2020-05-31 (×4): 1500 mg via ORAL
  Filled 2020-05-29 (×4): qty 2

## 2020-05-29 MED ORDER — SERTRALINE HCL 50 MG PO TABS
25.0000 mg | ORAL_TABLET | Freq: Every day | ORAL | Status: DC
  Administered 2020-05-29 – 2020-05-31 (×3): 25 mg via ORAL
  Filled 2020-05-29 (×3): qty 1

## 2020-05-29 MED ORDER — DIVALPROEX SODIUM 250 MG PO DR TAB
250.0000 mg | DELAYED_RELEASE_TABLET | Freq: Once | ORAL | Status: AC
  Administered 2020-05-29: 250 mg via ORAL
  Filled 2020-05-29: qty 1

## 2020-05-29 MED ORDER — ZIPRASIDONE HCL 20 MG PO CAPS
20.0000 mg | ORAL_CAPSULE | Freq: Every day | ORAL | Status: DC
  Administered 2020-05-29 – 2020-05-30 (×2): 20 mg via ORAL
  Filled 2020-05-29 (×3): qty 1

## 2020-05-29 NOTE — Progress Notes (Signed)
[facility] PROGRESS NOTE   [name] Cruz GMW:[id number] DOB: [birth year] DOA: 05/25/2020  PCP: [name], MD  Brief History/Interval Summary: 65 y.o. male with known history of stroke, seizures was found to have abnormal movement and confusion at home and was brought to the ER.  In the ER patient started developing abnormal movement in the left upper extremity and left-sided gaze.  Apparently was in status epilepticus for about 20 minutes. CT angiogram of the head and neck was done which did not show any large vessel obstruction but did show some signs of aspiration.  Patient was given 4 mg IV Ativan and Keppra was loaded per weight.  Neurologist at this time had EEG done and patient was started on IV Keppra Vimpat and valproate.  Valproic acid level was subtherapeutic.    Patient was hospitalized for further management.  Reason for Visit: Status epilepticus  Consultants: Neurology  Procedures: EEG, long-term monitoring  Antibiotics: Anti-infectives (From admission, onward)   Start     Dose/Rate Route Frequency Ordered Stop   05/26/20 0600  Ampicillin-Sulbactam (UNASYN) 3 g in sodium chloride 0.9 % 100 mL IVPB        3 g 200 mL/hr over 30 Minutes Intravenous Every 6 hours 05/26/20 0539 05/31/20 0559      Subjective/Interval History: Patient is mentation seems to be gradually improving.  He is more responsive.  Was able to answer a few questions today but still remains distracted.  Denies any headache or any other pain.    Assessment/Plan:  Status epilepticus in the setting of known seizure disorder In the ED patient was given 4 mg of Ativan and Keppra loading dose.  Patient was seen by neurology.  Placed on long-term monitoring.  No active seizures noted and so EEG was discontinued yesterday.  Patient remains on Vimpat, Keppra and Depakote.  Will change to oral today.   Aspiration pneumonia He had crackles on examination especially in the right lung.  No pneumonia  was noted on chest x-ray however clinically he was thought to have aspiration pneumonia.  He was initiated on Unasyn.  Will be given a 5-day course.  Aspiration precautions.   Speech therapy has been following.  Hypomagnesemia Continue to monitor periodically.  Replace as needed.  Normocytic anemia No evidence of overt bleeding.  Likely dilutional drop.  Monitor.  History of stroke with known left-sided weakness Stable.  Does not appear to have had a new event.  Neurological deficits due to seizure activity.  Patient was on aspirin and atorvastatin prior to admission.  These can be resumed since he has been cleared for oral intake.  Obesity Estimated body mass index is 30.58 kg/m as calculated from the following:   Height as of 03/13/20: 5\' 3"  (1.6 m).   Weight as of 10/09/19: 78.3 kg.   DVT Prophylaxis: Lovenox Code Status: Full code Family Communication: Patient's sisters updated yesterday. Disposition Plan: CIR recommended by PT but not thought to be a candidate rehab staff.  We will consult TOC for SNF placement for short-term rehab.  Status is: Inpatient  Remains inpatient appropriate because:Altered mental status, Ongoing diagnostic testing needed not appropriate for outpatient work up and Inpatient level of care appropriate due to severity of illness   Dispo:  Patient From: Home  Planned Disposition: To be determined  Medically stable for discharge: No          Medications:  Scheduled: . atorvastatin  80 mg Oral Daily  . enoxaparin (LOVENOX) injection  40 mg Subcutaneous Q24H  . sertraline  25 mg Oral Daily  . ziprasidone  20 mg Oral QHS   Continuous: . sodium chloride 50 mL/hr at 05/29/20 0640  . ampicillin-sulbactam (UNASYN) IV Stopped (05/29/20 0546)  . lacosamide (VIMPAT) IV Stopped (05/28/20 2137)  . levETIRAcetam 1,500 mg (05/29/20 0850)  . valproate sodium Stopped (05/29/20 0615)   BLT:[id number] **OR** acetaminophen   Objective:  Vital  Signs  Vitals:   05/28/20 1924 05/28/20 2357 05/29/20 0336 05/29/20 0754  BP: 128/85 109/69 107/74 132/79  Pulse: 77 65 61 66  Resp: 18 17 20 12   Temp: 98.1 F (36.7 C) 98 F (36.7 C) 98.2 F (36.8 C) 97.8 F (36.6 C)  TempSrc: Oral Oral Oral Oral  SpO2: 98% 96% 97% 97%    Intake/Output Summary (Last 24 hours) at 05/29/2020 0905 Last data filed at 05/29/2020 0640 Gross per 24 hour  Intake 1786.28 ml  Output 3300 ml  Net -1513.72 ml   There were no vitals filed for this visit.   General appearance: Awake alert.  In no distress Resp: Clear to auscultation bilaterally.  Normal effort Cardio: S1-S2 is normal regular.  No S3-S4.  No rubs murmurs or bruit GI: Abdomen is soft.  Nontender nondistended.  Bowel sounds are present normal.  No masses organomegaly Extremities: No edema.  Neurologic: Remains disoriented..  Though he is answering questions much more than before.  Weakness noted in the left upper extremity which is old.  Able to move his right arm and right leg as well as left lower leg.       Lab Results:  Data Reviewed: I have personally reviewed following labs and imaging studies  CBC: Recent Labs  Lab 05/25/20 2012 05/26/20 0225 05/27/20 0321 05/28/20 0448 05/29/20 0445  WBC 9.7 11.4* 7.0 9.7 6.8  NEUTROABS 5.3  --   --   --   --   HGB 14.0 12.9* 11.7* 12.5* 12.5*  HCT 41.5 36.9* 34.0* 35.8* 36.4*  MCV 95.2 92.9 93.7 92.3 93.3  PLT 228 211 185 195 190    Basic Metabolic Panel: Recent Labs  Lab 05/25/20 2012 05/26/20 0225 05/27/20 0321 05/28/20 0448 05/29/20 0445  NA 132* 132* 135 132* 136  K 4.4 3.9 3.6 3.8 3.8  CL 98 98 102 99 102  CO2 27 25 25 25 27   GLUCOSE 111* 112* 89 94 95  BUN 10 8 6* 6* 7*  CREATININE 0.67 0.55* 0.53* 0.44* 0.55*  CALCIUM 8.7* 8.7* 8.4* 8.9 8.6*  MG 1.9 1.6* 1.8 1.7  --     GFR: CrCl cannot be calculated (Unknown ideal weight.).  Liver Function Tests: Recent Labs  Lab 05/25/20 2355 05/26/20 0225  05/29/20 0445  AST  --  21 18  ALT  --  22 21  ALKPHOS  --  45 43  BILITOT  --  0.6 0.7  PROT  --  5.8* 5.6*  ALBUMIN 3.4* 3.3* 3.0*    CBG: Recent Labs  Lab 05/28/20 1548 05/28/20 1926 05/28/20 2357 05/29/20 0313 05/29/20 0758  GLUCAP 128* 113* 93 104* 99     Recent Results (from the past 240 hour(s))  Resp Panel by RT-PCR (Flu A&B, Covid) Nasopharyngeal Swab     Status: None   Collection Time: 05/25/20  8:12 PM   Specimen: Nasopharyngeal Swab; Nasopharyngeal(NP) swabs in vial transport medium  Result Value Ref Range Status   SARS Coronavirus 2 by RT PCR NEGATIVE NEGATIVE Final    Comment: (NOTE) SARS-CoV-2  target nucleic acids are NOT DETECTED.  The SARS-CoV-2 RNA is generally detectable in upper respiratory specimens during the acute phase of infection. The lowest concentration of SARS-CoV-2 viral copies this assay can detect is 138 copies/mL. A negative result does not preclude SARS-Cov-2 infection and should not be used as the sole basis for treatment or other patient management decisions. A negative result may occur with  improper specimen collection/handling, submission of specimen other than nasopharyngeal swab, presence of viral mutation(s) within the areas targeted by this assay, and inadequate number of viral copies(<138 copies/mL). A negative result must be combined with clinical observations, patient history, and epidemiological information. The expected result is Negative.  Fact Sheet for Patients:  BloggerCourse.com  Fact Sheet for Healthcare Providers:  SeriousBroker.it  This test is no t yet approved or cleared by the [place] FDA and  has been authorized for detection and/or diagnosis of SARS-CoV-2 by FDA under an Emergency Use Authorization (EUA). This EUA will remain  in effect (meaning this test can be used) for the duration of the COVID-19 declaration under Section 564(b)(1) of the Act,  21 U.S.C.section 360bbb-3(b)(1), unless the authorization is terminated  or revoked sooner.       Influenza A by PCR NEGATIVE NEGATIVE Final   Influenza B by PCR NEGATIVE NEGATIVE Final    Comment: (NOTE) The Xpert Xpress SARS-CoV-2/FLU/RSV plus assay is intended as an aid in the diagnosis of influenza from Nasopharyngeal swab specimens and should not be used as a sole basis for treatment. Nasal washings and aspirates are unacceptable for Xpert Xpress SARS-CoV-2/FLU/RSV testing.  Fact Sheet for Patients: BloggerCourse.com  Fact Sheet for Healthcare Providers: SeriousBroker.it  This test is not yet approved or cleared by the [place] FDA and has been authorized for detection and/or diagnosis of SARS-CoV-2 by FDA under an Emergency Use Authorization (EUA). This EUA will remain in effect (meaning this test can be used) for the duration of the COVID-19 declaration under Section 564(b)(1) of the Act, 21 U.S.C. section 360bbb-3(b)(1), unless the authorization is terminated or revoked.  Performed at [facility], [address], [place], [place] [address]       Radiology Studies: No results found.     LOS: 3 days   Holbert Caples [employer] on [email or web address]  05/29/2020, 9:05 AM

## 2020-05-29 NOTE — Progress Notes (Signed)
Re: [name] Cruz DOB: April 06, 1955 Date: 05/29/2020   To Whom It May Concern:  Please be advised that the above-named patient will require a short-term nursing home stay--anticipated 30 days or less for rehabilitation and strengthening. The plan is for home.

## 2020-05-29 NOTE — Care Management Important Message (Signed)
Important Message  Patient Details  Name: [name] Cruz MRN: [id number] Date of Birth: 25-Jul-1955   Medicare Important Message Given:  Yes     Yolani Vo Stefan Church 05/29/2020, 3:33 PM

## 2020-05-29 NOTE — TOC Initial Note (Signed)
Transition of Care [facility]) - Initial/Assessment Note    Patient Details  Name: [name] Cruz MRN: [id number] Date of Birth: 08-Apr-1955  Transition of Care [facility]) CM/SW Contact:    Kermit Balo, RN Phone Number: 05/29/2020, 1:16 PM  Clinical Narrative:                 Recommendations are for SNF rehab. CM spoke with patients POA: [name] Cruz and she states they prefer to take the patient home with [facility] services. [name] Cruz states they have 24 hour support/ supervision and all needed DME. She has no preference for [facility] agency. CM will arrange [facility] services.  TOC following.  Expected Discharge Plan: Skilled Nursing Facility Barriers to Discharge: Continued Medical Work up   Patient Goals and CMS Choice   CMS Medicare.gov Compare Post Acute Care list provided to:: Patient Represenative (must comment) Choice offered to / list presented to : Patient,Sibling  Expected Discharge Plan and Services Expected Discharge Plan: Skilled Nursing Facility   Discharge Planning Services: CM Consult Post Acute Care Choice: Home Health Living arrangements for the past 2 months: Single Family Home                           HH Arranged: PT,OT,Speech Therapy          Prior Living Arrangements/Services Living arrangements for the past 2 months: Single Family Home Lives with:: Relatives Patient language and need for interpreter reviewed:: Yes Do you feel safe going back to the place where you live?: Yes      Need for Family Participation in Patient Care: Yes (Comment) Care giver support system in place?: Yes (comment) Current home services: DME (wheelchair/ lift/ shower seat) Criminal Activity/Legal Involvement Pertinent to Current Situation/Hospitalization: No - Comment as needed  Activities of Daily Living      Permission Sought/Granted                  Emotional Assessment Appearance:: Appears stated age Attitude/Demeanor/Rapport: Engaged Affect (typically observed):  Accepting Orientation: : Oriented to Self,Oriented to Place   Psych Involvement: No (comment)  Admission diagnosis:  Transient alteration of awareness [R40.4] Status epilepticus (HCC) [G40.901] Seizures (HCC) [R56.9] Patient Active Problem List   Diagnosis Date Noted  . Cough 03/16/2020  . Bowel and bladder incontinence 10/22/2019  . SAH (subarachnoid hemorrhage) (HCC)   . Neurogenic bladder   . Hemiparesis affecting left side as late effect of stroke (HCC)   . Thalamic hemorrhage (HCC) 07/26/2019  . History of DVT in adulthood 02/21/2019  . PAD (peripheral artery disease) (HCC) 12/14/2018  . Agitation 09/04/2018  . Acute CVA (cerebrovascular accident) (HCC) 08/07/2018  . Cerebellar stroke, acute (HCC) 07/10/2018  . Status epilepticus (HCC) 03/25/2018  . Unilateral occipital headache 10/03/2017  . Ischemic stroke (HCC) 08/23/2017  . Coronary artery disease involving coronary bypass graft of native heart without angina pectoris   . Atrial fibrillation (HCC)   . Dysphagia, post-stroke   . Cardiac arrest (HCC) 08/09/2017  . S/P CABG x 3 02/24/2017  . NSTEMI (non-ST elevated myocardial infarction) (HCC) 02/21/2017  . Encephalomalacia 10/28/2012  . Seizure disorder (HCC) 04/26/2012  . Alcohol abuse 04/26/2012  . History of stroke with current residual effects   . Cognitive impairment   . HLD (hyperlipidemia)   . HTN (hypertension)    PCP:  [name], MD Pharmacy:   [id number] - [place], [place] - [address] [place] [address] Phone:  [phone] Fax: [phone]     Social Determinants of Health (SDOH) Interventions    Readmission Risk Interventions No flowsheet data found.

## 2020-05-29 NOTE — Progress Notes (Signed)
  Speech Language Pathology Treatment: Dysphagia  Patient Details Name: [name] Cruz MRN: [id number] DOB: 1955/04/03 Today's Date: 05/29/2020 Time: 4010-2725 SLP Time Calculation (min) (ACUTE ONLY): 12.13 min  Assessment / Plan / Recommendation Clinical Impression  Pt was seen for dysphagia treatment and was cooperative throughout the session. Pt, and nursing, reported that the pt has been tolerating the current diet without overt s/sx of aspiration. Both parties also stated that the pt has been tolerating pills whole with thin liquids. Pt consumed regular texture solids, and thin liquids via straw using consecutive swallows without s/sx of aspiration. Mastication and oral clearance were WFL and no symptoms of pharyngeal residue were noted. It is recommended that the current diet be continued. Further skilled SLP services are not clinically indicated at this time.    HPI HPI: 65 y.o. male admitted from home with abnormal movement and confusion, brought to ED with concern for seizure, became unresponsive and found to be in status epilepticus. Concern for aspiration event.  Hx strokes in 2020 and 2021, + seizure disorder. Pt has been seen by SLP during prior admissions.  He was followed for intensive SLP intervention during CIR admission July of 2021 and has had multiple prior swallowing evals during prior admissions (both clinical and instrumental).  Documentation review revealed improved swallowing at time of D/C (dys3/thin recommended).  Pt's communication was c/b repetitive phrases like  "I understand" and anomia with short sentences.      SLP Plan  Discharge SLP treatment due to (comment);All goals met       Recommendations  Diet recommendations: Regular;Thin liquid Liquids provided via: Cup;Straw Medication Administration: Whole meds with puree Supervision: Staff to assist with self feeding                Oral Care Recommendations: Oral care BID Follow up Recommendations:  None SLP Visit Diagnosis: Dysphagia, unspecified (R13.10) Plan: Discharge SLP treatment due to (comment);All goals met       Farah Lepak I. Hardin Negus, [place], [place] Office number [phone] Pager [phone]                Horton Marshall 05/29/2020, 11:51 AM

## 2020-05-29 NOTE — Progress Notes (Signed)
Physical Therapy Treatment Patient Details Name: [name] Cruz MRN: [id number] DOB: 1955/04/08 Today's Date: 05/29/2020    History of Present Illness 65 y.o. male with known history of stroke, seizures. Admitted Status epilepticus. Found to have aspiration pneumonia after szr. Hx Lt sided weakness from prior CVA.    PT Comments    Today's skilled session continued to focus on mobility progression. Pt continues to need cues for safety with 2 person assistance for OOB. Pt remains appropriate for CIR. Acute PT to continued during pt's hospital stay.   Follow Up Recommendations  CIR     Equipment Recommendations  Other (comment) (to be determined by next venue of care)    Recommendations for Other Services OT consult;Rehab consult     Precautions / Restrictions Precautions Precautions: Fall;Other (comment) (Seizure) Restrictions Weight Bearing Restrictions: No    Mobility  Bed Mobility Overal bed mobility: Needs Assistance Bed Mobility: Rolling;Sidelying to Sit Rolling: Max assist Sidelying to sit: Max assist;HOB elevated       General bed mobility comments: pt initally attempting to sit straight up without success. cues for rolling onto right side, to flex LE's then advance them off edge of bed and use of right UE to elevated trunk into sitting at edge of bed. max assist needed along with cues/facilitation for mobility.    Transfers Overall transfer level: Needs assistance Equipment used: 2 person hand held assist Transfers: Sit to/from [employer] Sit to Stand: Max assist;+2 physical assistance;From elevated surface Stand pivot transfers: Max assist;+2 physical assistance       General transfer comment: with two person assit able to stand from elevated mat and take 3-4 pivot steps from bed to chair. cues/facilitation needed for upright posture, hip extension.      Cognition Arousal/Alertness: Awake/alert Behavior During Therapy: Impulsive Overall  Cognitive Status: Impaired/Different from baseline Area of Impairment: Orientation;Following commands;Safety/judgement;Problem solving;Attention;Memory;Awareness                 Orientation Level: Disoriented to;Time;Situation Current Attention Level: Focused Memory: Decreased short-term memory Following Commands: Follows one step commands inconsistently;Follows one step commands with increased time Safety/Judgement: Decreased awareness of safety;Decreased awareness of deficits Awareness: Intellectual Problem Solving: Slow processing;Decreased initiation;Difficulty sequencing;Requires verbal cues;Requires tactile cues General Comments: pt not following commands consistently during session, delayed when he does. Pt requires multimodal cues and step by step instructions for sequencing. Pt with decreased attention span.       Pertinent Vitals/Pain Pain Assessment: No/denies pain Faces Pain Scale: No hurt     PT Goals (current goals can now be found in the care plan section) Acute Rehab PT Goals Patient Stated Goal: none stated PT Goal Formulation: Patient unable to participate in goal setting Time For Goal Achievement: 06/10/20 Potential to Achieve Goals: Good Progress towards PT goals: Progressing toward goals    Frequency    Min 4X/week      PT Plan Current plan remains appropriate    AM-PAC PT "6 Clicks" Mobility   Outcome Measure  Help needed turning from your back to your side while in a flat bed without using bedrails?: A Lot Help needed moving from lying on your back to sitting on the side of a flat bed without using bedrails?: A Lot Help needed moving to and from a bed to a chair (including a wheelchair)?: Total Help needed standing up from a chair using your arms (e.g., wheelchair or bedside chair)?: Total Help needed to walk in hospital room?: Total Help needed climbing  3-5 steps with a railing? : Total 6 Click Score: 8    End of Session Equipment  Utilized During Treatment: Gait belt Activity Tolerance: Patient tolerated treatment well;No increased pain Patient left: in chair;with call bell/phone within reach;with chair alarm set;Other (comment) (pads on floor next to chair for Seizure precautions) Nurse Communication: Mobility status PT Visit Diagnosis: Difficulty in walking, not elsewhere classified (R26.2);Other symptoms and signs involving the nervous system (R29.898);Hemiplegia and hemiparesis Hemiplegia - Right/Left: Left Hemiplegia - dominant/non-dominant: Non-dominant Hemiplegia - caused by:  (Hx of CVA)     Time: 9458-5929 PT Time Calculation (min) (ACUTE ONLY): 18 min  Charges:  $Therapeutic Activity: 8-22 mins                    Sallyanne Kuster, PTA, [facility] Acute Rehab Services Office- [phone] 05/29/20, 12:43 PM  Sallyanne Kuster 05/29/2020, 12:42 PM

## 2020-05-29 NOTE — [place] FL2 (Addendum)
[place] MEDICAID FL2 LEVEL OF CARE SCREENING TOOL     IDENTIFICATION  Patient Name: [name] Cruz Birthdate: October 02, 1955 Sex: male Admission Date (Current Location): 05/25/2020  [facility] [place] and [place] Number:  Producer, television/film/video and Address:  [facility], [address] [place], [place] [address]      Provider Number: [id number]  Attending Physician Name and Address:  Osvaldo Shipper, MD  Relative Name and Phone Number:       Current Level of Care: Hospital Recommended Level of Care: Skilled Nursing Facility Prior Approval Number:    Date Approved/Denied:   PASRR Number: [id number]  Discharge Plan: SNF    Current Diagnoses: Patient Active Problem List   Diagnosis Date Noted  . Cough 03/16/2020  . Bowel and bladder incontinence 10/22/2019  . SAH (subarachnoid hemorrhage) (HCC)   . Neurogenic bladder   . Hemiparesis affecting left side as late effect of stroke (HCC)   . Thalamic hemorrhage (HCC) 07/26/2019  . History of DVT in adulthood 02/21/2019  . PAD (peripheral artery disease) (HCC) 12/14/2018  . Agitation 09/04/2018  . Acute CVA (cerebrovascular accident) (HCC) 08/07/2018  . Cerebellar stroke, acute (HCC) 07/10/2018  . Status epilepticus (HCC) 03/25/2018  . Unilateral occipital headache 10/03/2017  . Ischemic stroke (HCC) 08/23/2017  . Coronary artery disease involving coronary bypass graft of native heart without angina pectoris   . Atrial fibrillation (HCC)   . Dysphagia, post-stroke   . Cardiac arrest (HCC) 08/09/2017  . S/P CABG x 3 02/24/2017  . NSTEMI (non-ST elevated myocardial infarction) (HCC) 02/21/2017  . Encephalomalacia 10/28/2012  . Seizure disorder (HCC) 04/26/2012  . Alcohol abuse 04/26/2012  . History of stroke with current residual effects   . Cognitive impairment   . HLD (hyperlipidemia)   . HTN (hypertension)     Orientation RESPIRATION BLADDER Height & Weight     Self,Place  Normal Incontinent Weight:    Height:     BEHAVIORAL SYMPTOMS/MOOD NEUROLOGICAL BOWEL NUTRITION STATUS    Convulsions/Seizures Continent Diet (Regular with thin liquids)  AMBULATORY STATUS COMMUNICATION OF NEEDS Skin   Extensive Assist Verbally Normal                       Personal Care Assistance Level of Assistance  Bathing,Feeding,Dressing Bathing Assistance: Maximum assistance Feeding assistance: Limited assistance Dressing Assistance: Maximum assistance     Functional Limitations Info  Sight,Hearing,Speech Sight Info: Adequate Hearing Info: Adequate Speech Info: Adequate    SPECIAL CARE FACTORS FREQUENCY  PT (By licensed PT),OT (By licensed OT),Speech therapy     PT Frequency: 5x/wk OT Frequency: 5x/wk     Speech Therapy Frequency: 5x/wk      Contractures Contractures Info: Not present    Additional Factors Info  Code Status,Allergies,Psychotropic Code Status Info: Full Allergies Info: Losartan Psychotropic Info: Depakote DR 750 mg every 12 hours/ Vimpat 200 mg BID/ Zoloft 25 mg daily/ Geodon 20 mg at bedtime         Current Medications (05/29/2020):  This is the current hospital active medication list Current Facility-Administered Medications  Medication Dose Route Frequency Provider Last Rate Last Admin  . acetaminophen (TYLENOL) tablet 650 mg  650 mg Oral Q6H PRN Eduard Clos, MD       Or  . acetaminophen (TYLENOL) suppository 650 mg  650 mg Rectal Q6H PRN Eduard Clos, MD      . Ampicillin-Sulbactam (UNASYN) 3 g in sodium chloride 0.9 % 100 mL  IVPB  3 g Intravenous Q6H Osvaldo Shipper, MD   Stopped at 05/29/20 [phone]  . atorvastatin (LIPITOR) tablet 80 mg  80 mg Oral Daily Osvaldo Shipper, MD   80 mg at 05/29/20 0846  . divalproex (DEPAKOTE) DR tablet 750 mg  750 mg Oral Q12H Osvaldo Shipper, MD      . enoxaparin (LOVENOX) injection 40 mg  40 mg Subcutaneous Q24H Eduard Clos, MD   40 mg at 05/29/20 0847  . lacosamide (VIMPAT) tablet 200 mg  200 mg Oral BID  Osvaldo Shipper, MD   200 mg at 05/29/20 1028  . levETIRAcetam (KEPPRA) tablet 1,500 mg  1,500 mg Oral BID Osvaldo Shipper, MD      . sertraline (ZOLOFT) tablet 25 mg  25 mg Oral Daily Osvaldo Shipper, MD   25 mg at 05/29/20 0846  . ziprasidone (GEODON) capsule 20 mg  20 mg Oral QHS Osvaldo Shipper, MD         Discharge Medications: Please see discharge summary for a list of discharge medications.  Relevant Imaging Results:  Relevant Lab Results:   Additional Information SS#: [id number]  Kermit Balo, RN

## 2020-05-30 LAB — BASIC METABOLIC PANEL
Anion gap: 5 (ref 5–15)
BUN: 7 mg/dL — ABNORMAL LOW (ref 8–23)
CO2: 29 mmol/L (ref 22–32)
Calcium: 8.4 mg/dL — ABNORMAL LOW (ref 8.9–10.3)
Chloride: 98 mmol/L (ref 98–111)
Creatinine, Ser: 0.53 mg/dL — ABNORMAL LOW (ref 0.61–1.24)
GFR, Estimated: >60 mL/min (ref >60)
Glucose, Bld: 89 mg/dL (ref 70–99)
Potassium: 3.3 mmol/L — ABNORMAL LOW (ref 3.5–5.1)
Sodium: 132 mmol/L — ABNORMAL LOW (ref 135–145)

## 2020-05-30 LAB — GLUCOSE, CAPILLARY
Glucose-Capillary: 114 mg/dL — ABNORMAL HIGH (ref 70–99)
Glucose-Capillary: 82 mg/dL (ref 70–99)
Glucose-Capillary: 84 mg/dL (ref 70–99)
Glucose-Capillary: 89 mg/dL (ref 70–99)
Glucose-Capillary: 92 mg/dL (ref 70–99)
Glucose-Capillary: 97 mg/dL (ref 70–99)

## 2020-05-30 LAB — CBC
HCT: 32.3 % — ABNORMAL LOW (ref 39.0–52.0)
Hemoglobin: 11 g/dL — ABNORMAL LOW (ref 13.0–17.0)
MCH: 32 pg (ref 26.0–34.0)
MCHC: 34.1 g/dL (ref 30.0–36.0)
MCV: 93.9 fL (ref 80.0–100.0)
Platelets: 153 10*3/uL (ref 150–400)
RBC: 3.44 MIL/uL — ABNORMAL LOW (ref 4.22–5.81)
RDW: 13.1 % (ref 11.5–15.5)
WBC: 7.1 10*3/uL (ref 4.0–10.5)
nRBC: 0 % (ref 0.0–0.2)

## 2020-05-30 LAB — MAGNESIUM: Magnesium: 1.8 mg/dL (ref 1.7–2.4)

## 2020-05-30 MED ORDER — POTASSIUM CHLORIDE CRYS ER 20 MEQ PO TBCR
40.0000 meq | EXTENDED_RELEASE_TABLET | Freq: Once | ORAL | Status: AC
  Administered 2020-05-30: 40 meq via ORAL
  Filled 2020-05-30: qty 2

## 2020-05-30 MED ORDER — DIVALPROEX SODIUM 250 MG PO DR TAB: 750.0000 mg | DELAYED_RELEASE_TABLET | Freq: Two times a day (BID) | ORAL | 2 refills | Status: DC

## 2020-05-30 NOTE — Discharge Summary (Addendum)
[facility]  Physician Discharge Summary   Patient ID: [name] Cruz MRN: [id number] DOB/AGE: 09/08/1955 65 y.o.  Admit date: 05/25/2020 Discharge date: 05/30/2020  PCP: [name], MD  DISCHARGE DIAGNOSES:  Status epilepticus Known history of seizure disorder Aspiration pneumonia History of stroke with known left-sided weakness Normocytic anemia  RECOMMENDATIONS FOR OUTPATIENT FOLLOW UP: 1. Ambulatory referral sent to neurology for follow-up 2. Home health will be ordered   Home Health: PT OT RN Equipment/Devices: None  CODE STATUS: Full code  DISCHARGE CONDITION: fair  Diet recommendation: As before  INITIAL HISTORY: 65 y.o.malewithknown history of stroke, seizures was found to have abnormal movement and confusion at home and was brought to the ER. In the ER patient started developing abnormal movement in the left upper extremity and left-sided gaze.  Apparently was in status epilepticus for about 20 minutes. CT angiogram of the head and neck was done which did not show any large vessel obstruction but did show some signs of aspiration. Patient was given 4 mg IV Ativan and Keppra was loaded per weight. Neurologist at this time had EEG done and patient was started on IV Keppra Vimpat and valproate. Valproic acid level was subtherapeutic.   Patient was hospitalized for further management.  Consultants: Neurology  Procedures: EEG, long-term monitoring   HOSPITAL COURSE:   Status epilepticus in the setting of known seizure disorder In the ED patient was given 4 mg of Ativan and Keppra loading dose.  Patient was seen by neurology.    Valproic acid level was noted to be subtherapeutic.  Patient was last placed on higher dose of Depakote.  Initially medications were given intravenously.  Patient was also continued on Vimpat and Keppra.  He was placed on long-term EEG monitoring.  Patient stabilized.  Transitioned to oral medications.  Seems to be  improving gradually.   Seen by PT and OT who recommended initially CIR but he was not thought to be candidate for inpatient rehabilitation.  Family not interested in SNF.  He has good support system at home.  His sisters take care of him.  Home health will be ordered.  They want to set a few things up today at home and request that he be discharged tomorrow.    Aspiration pneumonia He had crackles on examination especially in the right lung.  No pneumonia was noted on chest x-ray however clinically he was thought to have aspiration pneumonia.  He was initiated on Unasyn.  Was given a 5-day course.  Was seen by speech therapy.  Hypomagnesemia hypokalemia/hyponatremia Potassium will be repleted today.  Recheck tomorrow.  Magnesium is 1.8.   Sodium level has been fluctuating.  Stable.  Normocytic anemia No evidence of overt bleeding.  Likely dilutional drop.  Outpatient monitoring.  History of stroke with known left-sided weakness Stable.  Does not appear to have had a new event.  Neurological deficits due to seizure activity.  Resume aspirin and statin.  Obesity Estimated body mass index is 30.58 kg/m as calculated from the following:   Height as of 03/13/20:  (1.6 m).   Weight as of 10/09/19: 78.3 kg.   Overall stable.  Okay for discharge once home health and other equipment has been arranged at home.   PERTINENT LABS:  The results of significant diagnostics from this hospitalization (including imaging, microbiology, ancillary and laboratory) are listed below for reference.    Microbiology: Recent Results (from the past 240 hour(s))  Resp Panel by RT-PCR (Flu A&B, Covid) Nasopharyngeal Swab  Status: None   Collection Time: 05/25/20  8:12 PM   Specimen: Nasopharyngeal Swab; Nasopharyngeal(NP) swabs in vial transport medium  Result Value Ref Range Status   SARS Coronavirus 2 by RT PCR NEGATIVE NEGATIVE Final    Comment: (NOTE) SARS-CoV-2 target nucleic acids are NOT  DETECTED.  The SARS-CoV-2 RNA is generally detectable in upper respiratory specimens during the acute phase of infection. The lowest concentration of SARS-CoV-2 viral copies this assay can detect is 138 copies/mL. A negative result does not preclude SARS-Cov-2 infection and should not be used as the sole basis for treatment or other patient management decisions. A negative result may occur with  improper specimen collection/handling, submission of specimen other than nasopharyngeal swab, presence of viral mutation(s) within the areas targeted by this assay, and inadequate number of viral copies(<138 copies/mL). A negative result must be combined with clinical observations, patient history, and epidemiological information. The expected result is Negative.  Fact Sheet for Patients:  BloggerCourse.com  Fact Sheet for Healthcare Providers:  SeriousBroker.it  This test is no t yet approved or cleared by the [place] FDA and  has been authorized for detection and/or diagnosis of SARS-CoV-2 by FDA under an Emergency Use Authorization (EUA). This EUA will remain  in effect (meaning this test can be used) for the duration of the COVID-19 declaration under Section 564(b)(1) of the Act, 21 U.S.C.section 360bbb-3(b)(1), unless the authorization is terminated  or revoked sooner.       Influenza A by PCR NEGATIVE NEGATIVE Final   Influenza B by PCR NEGATIVE NEGATIVE Final    Comment: (NOTE) The Xpert Xpress SARS-CoV-2/FLU/RSV plus assay is intended as an aid in the diagnosis of influenza from Nasopharyngeal swab specimens and should not be used as a sole basis for treatment. Nasal washings and aspirates are unacceptable for Xpert Xpress SARS-CoV-2/FLU/RSV testing.  Fact Sheet for Patients: BloggerCourse.com  Fact Sheet for Healthcare Providers: SeriousBroker.it  This test is not yet  approved or cleared by the [place] FDA and has been authorized for detection and/or diagnosis of SARS-CoV-2 by FDA under an Emergency Use Authorization (EUA). This EUA will remain in effect (meaning this test can be used) for the duration of the COVID-19 declaration under Section 564(b)(1) of the Act, 21 U.S.C. section 360bbb-3(b)(1), unless the authorization is terminated or revoked.  Performed at [facility], [address], [place], [place] [address]      Labs:  COVID-19 Labs   Lab Results  Component Value Date   SARSCOV2NAA NEGATIVE 05/25/2020   SARSCOV2NAA NEGATIVE 10/09/2019   SARSCOV2NAA NEGATIVE 08/16/2019   SARSCOV2NAA NEGATIVE 07/19/2019      Basic Metabolic Panel: Recent Labs  Lab 05/25/20 2012 05/26/20 0225 05/27/20 0321 05/28/20 0448 05/29/20 0445 05/30/20 0426  NA 132* 132* 135 132* 136 132*  K 4.4 3.9 3.6 3.8 3.8 3.3*  CL 98 98 102 99 102 98  CO2 GLUCOSE 111* 112* 89 94 95 89  BUN 10 8 6* 6* 7* 7*  CREATININE 0.67 0.55* 0.53* 0.44* 0.55* 0.53*  CALCIUM 8.7* 8.7* 8.4* 8.9 8.6* 8.4*  MG 1.9 1.6* 1.8 1.7  --  1.8   Liver Function Tests: Recent Labs  Lab 05/25/20 2355 05/26/20 0225 05/29/20 0445  AST  --  21 18  ALT  --  22 21  ALKPHOS  --  45 43  BILITOT  --  0.6 0.7  PROT  --  5.8* 5.6*  ALBUMIN 3.4*  3.3* 3.0*   CBC: Recent Labs  Lab 05/25/20 2012 05/26/20 0225 05/27/20 0321 05/28/20 0448 05/29/20 0445 05/30/20 0426  WBC 9.7 11.4* 7.0 9.7 6.8 7.1  NEUTROABS 5.3  --   --   --   --   --   HGB 14.0 12.9* 11.7* 12.5* 12.5* 11.0*  HCT 41.5 36.9* 34.0* 35.8* 36.4* 32.3*  MCV 95.2 92.9 93.7 92.3 93.3 93.9  PLT 228 211 185 195 190 153   BNP: BNP (last 3 results) Recent Labs    07/25/19 0431  BNP 59.9    CBG: Recent Labs  Lab 05/29/20 1626 05/29/20 2013 05/30/20 0011 05/30/20 0408 05/30/20 0820  GLUCAP 103* 134* 92 82 89     IMAGING STUDIES CT ANGIO HEAD NECK W WO CM  Result Date:  05/25/2020 CLINICAL DATA:  Initial evaluation for focal neural deficit, stroke suspected. EXAM: CT ANGIOGRAPHY HEAD AND NECK TECHNIQUE: Multidetector CT imaging of the head and neck was performed using the standard protocol during bolus administration of intravenous contrast. Multiplanar CT image reconstructions and MIPs were obtained to evaluate the vascular anatomy. Carotid stenosis measurements (when applicable) are obtained utilizing NASCET criteria, using the distal internal carotid diameter as the denominator. CONTRAST:  52mL OMNIPAQUE IOHEXOL 350 MG/ML SOLN COMPARISON:  Prior CT from 07/19/2019 and MRA from 07/20/2019. FINDINGS: CT HEAD FINDINGS Brain: Generalized age-related cerebral atrophy with chronic microvascular ischemic disease. Remote lacunar infarct at the right basal ganglia with associated wallerian degeneration. Chronic encephalomalacia of with dystrophic calcification involving the left temporoparietal region again noted. Associated ex vacuo dilatation of the left lateral ventricle. No acute intracranial hemorrhage. No acute large vessel territory infarct. No mass lesion, mass effect or midline shift. No extra-axial fluid collection. Ventricles stable in size without hydrocephalus. Vascular: No hyperdense vessel. Scattered vascular calcifications noted within the carotid siphons. Skull: Scalp soft tissues and calvarium within normal limits. Sinuses: Mild scattered mucosal thickening noted within the ethmoidal air cells. Paranasal sinuses are otherwise largely clear. Trace left mastoid effusion noted, no full significance. Orbits: Left gaze noted. Globes and orbital soft tissues demonstrate no other acute finding. Review of the MIP images confirms the above findings CTA NECK FINDINGS Aortic arch: Visualized aortic arch normal caliber with normal branch pattern. Mild atheromatous change about the aortic arch and origin of the great vessels without hemodynamically significant stenosis. Right  carotid system: Right CCA patent from its origin to the bifurcation without stenosis. Bulky calcified plaque about the right carotid bulb/proximal right ICA with associated stenosis of up to 50% by NASCET criteria. Right ICA patent distally without stenosis, dissection or occlusion. Left carotid system: Left CCA patent from its origin to the bifurcation without stenosis. Bulky calcified plaque about the left carotid bulb with no more than mild 25% stenosis by NASCET criteria. Left ICA patent distally without stenosis, dissection or occlusion. Vertebral arteries: Left vertebral artery arises directly from the aortic arch. Calcified plaque at the origins of both vertebral arteries with estimated moderate approximate 50% stenoses bilaterally. Vertebral arteries mildly tortuous but otherwise widely patent within the neck without stenosis, dissection or occlusion. Skeleton: No visible acute osseous finding. No discrete or worrisome osseous lesions. Other neck: No other acute soft tissue abnormality within the neck. No mass or adenopathy. Upper chest: Patchy and hazy opacity seen within the partially visualized right upper lobe, which could reflect atelectasis and/or infiltrate. Layering secretions noted within the subglottic trachea at the level of the carina with extension into the proximal right mainstem bronchus. Visualized  upper chest demonstrates no other acute finding. Review of the MIP images confirms the above findings CTA HEAD FINDINGS Anterior circulation: Petrous segments patent bilaterally. Scattered atheromatous change within the carotid siphons bilaterally, left worse than right. Associated moderate stenosis at the supraclinoid left ICA. A1 segments patent bilaterally. Right A1 is partially fenestrated distally. Normal anterior communicating artery complex. Anterior cerebral arteries patent to their distal aspects without stenosis. No M1 stenosis or occlusion. Normal MCA bifurcations. Distal MCA branches  well perfused and symmetric. Posterior circulation: Focal calcified plaque noted within the proximal left V4 segment without significant stenosis. Vertebral arteries otherwise widely patent to the vertebrobasilar junction. Left PICA origin patent and normal. Right PICA not seen. Basilar patent to its distal aspect without stenosis. Superior cerebral arteries patent bilaterally. Left PCA supplied via the basilar. Fetal type origin of the right PCA. Both PCAs well perfused or distal aspects. Venous sinuses: Grossly patent allowing for timing the contrast bolus. Anatomic variants: Fetal type origin of the right PCA.  No aneurysm. Review of the MIP images confirms the above findings IMPRESSION: CT HEAD IMPRESSION: 1. No acute intracranial abnormality. 2. Chronic left temporoparietal and right basal ganglia infarcts. 3. Underlying atrophy with chronic microvascular ischemic disease. CTA HEAD AND NECK IMPRESSION: 1. Negative CTA for large vessel occlusion. 2. Atheromatous change about the carotid bifurcations with up to 50% stenosis on the right. 3. Moderate atheromatous stenosis at the supraclinoid left ICA. 4. Moderate estimated 50% stenoses at the origins of both vertebral arteries. 5. Hazy and patchy opacity within the posterior right upper lobe, which could reflect atelectasis or infiltrate. Layering secretions/debris seen within the subglottic trachea and right mainstem bronchus, raising the possibility for aspiration. CT PERFUSION IMPRESSION: Electronically Signed   By: Rise Mu M.D.   On: 05/25/2020 22:14   DG Chest Portable 1 View  Result Date: 05/25/2020 CLINICAL DATA:  65 year old male with cough. EXAM: PORTABLE CHEST 1 VIEW COMPARISON:  Chest radiograph dated 10/09/2019. FINDINGS: No focal consolidation, pleural effusion, pneumothorax. Stable cardiomegaly. Median sternotomy wires and CABG vascular clips. No acute osseous pathology. IMPRESSION: No active cardiopulmonary disease. Electronically  Signed   By: Elgie Collard M.D.   On: 05/25/2020 20:44   Overnight EEG with video  Result Date: 05/26/2020 [name], MD     05/27/2020  9:31 AM Patient Name: [name] Cruz MRN: [id number] Epilepsy Attending: [name] Referring Physician/Provider: Dr. Onalee Hua Duration: 05/25/2020 2138 to 05/26/2020 1825 Patient history:  65 year old male with a history of seizures with breakthrough seizures.  EEG to evaluate for seizures. Level of alertness: Awake, asleep AEDs during EEG study: Keppra, Vimpat, Depakote Technical aspects: This EEG study was done with scalp electrodes positioned according to the 10-20 International system of electrode placement. Electrical activity was acquired at a sampling rate of 500Hz  and reviewed with a high frequency filter of 70Hz  and a low frequency filter of 1Hz . EEG data were recorded continuously and digitally stored. Description: The posterior dominant rhythm consists of 9 Hz activity of moderate voltage (25-35 uV) seen predominantly in posterior head regions, symmetric and reactive to eye opening and eye closing. Sleep was characterized by vertex waves, sleep spindles (12 to 14 Hz), maximal frontocentral region. EEG showed continuous  3 to 6 Hz theta-delta slowing in right hemisphere. Sharp waves are also noted in right frontotemporal region. Intermittent rhythmic 3 to 5 Hz theta-delta slowing was seen in left temporal region. Intermittent generalized 3 to 5 Hz theta-delta slowing was also noted. Four  episodes were noted on 05/26/2020 at 0424, 0521, 0707 and 0943 during which patient was noted to be laying in bed, looking towards right. Concomitant eeg showed 6-7hz  sharply contoured theta slowing in right frontotemporal region which gradually evolved into 2-3Hz  delta slowing and spread to involve right hemisphere consistent with seizure. Duration of seizures was about 45 seconds to 1.5 mins Hyperventilation and photic stimulation were not performed.  Study  was not interpretable after 1825 as patient pulled his electrodes.  ABNORMALITY - Seizure,right frontotemporal region -Sharp wave, right frontotemporal region - Continuous slow, right hemisphere - Intermittent rhythmic slow, left temporal region - Intermittent slow, generalized IMPRESSION: This study showed four seizures on 05/26/2020 at 0424, 0521, 0707 and 0943 during which patient was noted to be laying in bed, looking towards right, arising from right frontotemporal region, average duration 45 seconds to 1.5 mins. There is evidence of epileptogenicity and cortical dysfunction in right hemisphere likely secondary to underlying structural abnormality.  Additionally, there is nonspecific cortical dysfunction in left temporal region as well as mild diffuse encephalopathy, nonspecific etiology but likely secondary to ictal-postictal state.  Priyanka O Yadav    DISCHARGE EXAMINATION: Vitals:   05/29/20 2010 05/30/20 0008 05/30/20 0404 05/30/20 0817  BP: 118/70 90/62 94/60  126/72  Pulse: 82 (!) 53 (!) 51 (!) 55  Resp: 18 18 14 14   Temp: 98.3 F (36.8 C) 97.7 F (36.5 C) (!) 97.5 F (36.4 C) 98.1 F (36.7 C)  TempSrc: Oral Axillary Axillary Oral  SpO2: 97% 98% 99% 98%   General appearance: Awake alert.  In no distress Resp: Clear to auscultation bilaterally.  Normal effort Cardio: S1-S2 is normal regular.  No S3-S4.  No rubs murmurs or bruit GI: Abdomen is soft.  Nontender nondistended.  Bowel sounds are present normal.  No masses organomegaly    DISPOSITION: Home with home health  Discharge Instructions    Ambulatory referral to Neurology   Complete by: As directed    An appointment is requested in approximately: 2 weeks for seizure f/u. Patient was hospitalized for seizures.   Call MD for:  difficulty breathing, headache or visual disturbances   Complete by: As directed    Call MD for:  extreme fatigue   Complete by: As directed    Call MD for:  persistant dizziness or light-headedness    Complete by: As directed    Call MD for:  persistant nausea and vomiting   Complete by: As directed    Call MD for:  severe uncontrolled pain   Complete by: As directed    Call MD for:  temperature >100.4   Complete by: As directed    Diet - low sodium heart healthy   Complete by: As directed    Discharge instructions   Complete by: As directed    Please be sure to follow-up with your primary care provider in 1 week.  A referral has been sent to Dr. office with neurology to arrange outpatient follow-up.  Taking medications as prescribed.  Seek attention if your symptoms were to recur.  You were cared for by a hospitalist during your hospital stay. If you have any questions about your discharge medications or the care you received while you were in the hospital after you are discharged, you can call the unit and asked to speak with the hospitalist on call if the hospitalist that took care of you is not available. Once you are discharged, your primary care physician will handle any further medical issues.  Please note that NO REFILLS for any discharge medications will be authorized once you are discharged, as it is imperative that you return to your primary care physician (or establish a relationship with a primary care physician if you do not have one) for your aftercare needs so that they can reassess your need for medications and monitor your lab values. If you do not have a primary care physician, you can call [phone] for a physician referral.   Increase activity slowly   Complete by: As directed         Allergies as of 05/30/2020      Reactions   Losartan Other (See Comments)   hyperkalemia      Medication List    TAKE these medications   aspirin EC 81 MG tablet Take 1 tablet (81 mg total) by mouth daily. Swallow whole.   atorvastatin 80 MG tablet Commonly known as: LIPITOR TAKE 1 TABLET BY MOUTH EVERY DAY   divalproex 250 MG DR tablet Commonly known as:  DEPAKOTE Take 3 tablets (750 mg total) by mouth every 12 (twelve) hours. What changed:   medication strength  how much to take  when to take this   lacosamide 200 MG Tabs tablet Commonly known as: Vimpat 1 tablet at 8 am and 1 tablet at 10 pm. What changed:   how much to take  how to take this  when to take this  additional instructions   levETIRAcetam 750 MG tablet Commonly known as: KEPPRA Take 2 tablets (1,500 mg total) by mouth 2 (two) times daily.   multivitamin with minerals Tabs tablet Take 1 tablet by mouth daily.   sertraline 25 MG tablet Commonly known as: ZOLOFT TAKE 1 TABLET BY MOUTH EVERY DAY   Vitamin B-12 1000 MCG Subl Place 1,000 mcg under the tongue daily.   ziprasidone 20 MG capsule Commonly known as: GEODON Take 1 capsule (20 mg total) by mouth at bedtime.         Follow-up Information    [name], [name], MD. Schedule an appointment as soon as possible for a visit in 1 week(s).   Specialty: Family Medicine Contact information: [address] [place] [address] [phone]        Micki RileySethi, Pramod S, MD Follow up.   Specialties: Neurology, Radiology Why: His office will call you to schedule appointment Contact information: [address] [place] [address] [phone]               TOTAL DISCHARGE TIME: 35 minutes  Bartlomiej Jenkinson Rito EhrlichKrishnan  [facility] Pager on [email or web address]  05/30/2020, 10:59 AM

## 2020-05-30 NOTE — Plan of Care (Signed)

## 2020-05-30 NOTE — Progress Notes (Signed)
Physical Therapy Treatment Patient Details Name: [name] Cruz MRN: [id number] DOB: 03/26/1955 Today's Date: 05/30/2020    History of Present Illness 65 y.o. male with known history of stroke, seizures. Admitted Status epilepticus. Found to have aspiration pneumonia after szr. Hx Lt sided weakness from prior CVA.    PT Comments    Pt agreeable to session. Worked on facilitated bed mobility for clean up of soiled bedding. Pt then came SL to sit with mod A and tactile cues. Pt stood with mod A +2 working on wt shifting but with consistent R lean. Pivoted to chair with mod A +2. Pt fatigued after session. Pt continues to need 2 person assist, question whether family prepared to provide this at home. PT will continue to follow.   Follow Up Recommendations  CIR     Equipment Recommendations  Other (comment) (to be determined by next venue of care)    Recommendations for Other Services OT consult;Rehab consult     Precautions / Restrictions Precautions Precautions: Fall;Other (comment) (Seizure) Restrictions Weight Bearing Restrictions: No    Mobility  Bed Mobility Overal bed mobility: Needs Assistance Bed Mobility: Rolling;Sidelying to Sit Rolling: Mod assist Sidelying to sit: HOB elevated;Mod assist       General bed mobility comments: pt rolled each way for bowel clean up. Tactile cues needed to initiate rolling, pt grasping rail R hand to assist and able to pull on rail with R hand for SL to sit.    Transfers Overall transfer level: Needs assistance Equipment used: 2 person hand held assist Transfers: Sit to/from [employer] Sit to Stand: Max assist;+2 physical assistance;From elevated surface Stand pivot transfers: Max assist;+2 physical assistance       General transfer comment: able to step feet bed to chair with mod A +2 for support, needed facilitation for hip extension  Ambulation/Gait Ambulation/Gait assistance: Mod assist;+2 physical  assistance Gait Distance (Feet): 3 Feet Assistive device: 2 person hand held assist Gait Pattern/deviations: Step-to pattern;Decreased weight shift to left Gait velocity: decreased Gait velocity interpretation: <1.31 ft/sec, indicative of household ambulator General Gait Details: decreased wt shift to L to step feet   Stairs             Wheelchair Mobility    Modified Rankin (Stroke Patients Only) Modified Rankin (Stroke Patients Only) Pre-Morbid Rankin Score: Moderate disability Modified Rankin: Severe disability     Balance Overall balance assessment: Needs assistance Sitting-balance support: Single extremity supported;Feet supported Sitting balance-Leahy Scale: Poor Sitting balance - Comments: close guarding in sitting, occasional posterior LOB Postural control: Right lateral lean Standing balance support: Bilateral upper extremity supported;During functional activity Standing balance-Leahy Scale: Poor Standing balance comment: needs mod A +2 to maintain standing                            Cognition Arousal/Alertness: Awake/alert Behavior During Therapy: Impulsive Overall Cognitive Status: Impaired/Different from baseline Area of Impairment: Orientation;Following commands;Safety/judgement;Problem solving;Attention;Memory;Awareness                 Orientation Level: Disoriented to;Time;Situation Current Attention Level: Focused Memory: Decreased short-term memory Following Commands: Follows one step commands inconsistently;Follows one step commands with increased time Safety/Judgement: Decreased awareness of safety;Decreased awareness of deficits Awareness: Intellectual Problem Solving: Slow processing;Decreased initiation;Difficulty sequencing;Requires verbal cues;Requires tactile cues General Comments: cues for attending to commands, followed simple one step commands with increased time      Exercises General Exercises - Lower  Extremity Ankle Circles/Pumps: AROM;Both;10 reps;Supine Heel Slides: AROM;Both;10 reps;Supine    General Comments General comments (skin integrity, edema, etc.): VSS      Pertinent Vitals/Pain Pain Assessment: No/denies pain    Home Living                      Prior Function            PT Goals (current goals can now be found in the care plan section) Acute Rehab PT Goals Patient Stated Goal: none stated PT Goal Formulation: Patient unable to participate in goal setting Time For Goal Achievement: 06/10/20 Potential to Achieve Goals: Good Progress towards PT goals: Progressing toward goals    Frequency    Min 4X/week      PT Plan Current plan remains appropriate    Co-evaluation              AM-PAC PT "6 Clicks" Mobility   Outcome Measure  Help needed turning from your back to your side while in a flat bed without using bedrails?: A Lot Help needed moving from lying on your back to sitting on the side of a flat bed without using bedrails?: A Lot Help needed moving to and from a bed to a chair (including a wheelchair)?: Total Help needed standing up from a chair using your arms (e.g., wheelchair or bedside chair)?: Total Help needed to walk in hospital room?: Total Help needed climbing 3-5 steps with a railing? : Total 6 Click Score: 8    End of Session Equipment Utilized During Treatment: Gait belt Activity Tolerance: Patient tolerated treatment well Patient left: in chair;with call bell/phone within reach;with chair alarm set Nurse Communication: Mobility status PT Visit Diagnosis: Difficulty in walking, not elsewhere classified (R26.2);Other symptoms and signs involving the nervous system (R29.898);Hemiplegia and hemiparesis Hemiplegia - Right/Left: Left Hemiplegia - dominant/non-dominant: Non-dominant Hemiplegia - caused by:  (Hx of CVA)     Time: 4008-6761 PT Time Calculation (min) (ACUTE ONLY): 24 min  Charges:  $Gait Training: 8-22  mins $Therapeutic Activity: 8-22 mins                     Lyanne Co, PT  Acute Rehab Services  Pager [phone] Office [phone]    Lawana Chambers Braelynn Benning 05/30/2020, 1:44 PM

## 2020-05-31 DIAGNOSIS — G40901 Epilepsy, unspecified, not intractable, with status epilepticus: Principal | ICD-10-CM

## 2020-05-31 LAB — GLUCOSE, CAPILLARY
Glucose-Capillary: 95 mg/dL (ref 70–99)
Glucose-Capillary: 89 mg/dL (ref 70–99)
Glucose-Capillary: 79 mg/dL (ref 70–99)
Glucose-Capillary: 128 mg/dL — ABNORMAL HIGH (ref 70–99)
Glucose-Capillary: 107 mg/dL — ABNORMAL HIGH (ref 70–99)

## 2020-05-31 LAB — BASIC METABOLIC PANEL
Anion gap: 6 (ref 5–15)
BUN: 9 mg/dL (ref 8–23)
CO2: 27 mmol/L (ref 22–32)
Calcium: 8.6 mg/dL — ABNORMAL LOW (ref 8.9–10.3)
Chloride: 104 mmol/L (ref 98–111)
Creatinine, Ser: 0.56 mg/dL — ABNORMAL LOW (ref 0.61–1.24)
GFR, Estimated: >60 mL/min (ref >60)
Glucose, Bld: 80 mg/dL (ref 70–99)
Potassium: 4.8 mmol/L (ref 3.5–5.1)
Sodium: 137 mmol/L (ref 135–145)

## 2020-05-31 LAB — LEVETIRACETAM LEVEL: Levetiracetam Lvl: 130.5 ug/mL — ABNORMAL HIGH (ref 10.0–40.0)

## 2020-05-31 LAB — MAGNESIUM: Magnesium: 2 mg/dL (ref 1.7–2.4)

## 2020-05-31 NOTE — Plan of Care (Signed)
  Problem: Education: Goal: Expressions of having a comfortable level of knowledge regarding the disease process will increase Outcome: Adequate for Discharge   Problem: Coping: Goal: Ability to adjust to condition or change in health will improve Outcome: Adequate for Discharge Goal: Ability to identify appropriate support needs will improve Outcome: Adequate for Discharge   Problem: Health Behavior/Discharge Planning: Goal: Compliance with prescribed medication regimen will improve Outcome: Adequate for Discharge   Problem: Medication: Goal: Risk for medication side effects will decrease Outcome: Adequate for Discharge   Problem: Clinical Measurements: Goal: Complications related to the disease process, condition or treatment will be avoided or minimized Outcome: Adequate for Discharge Goal: Diagnostic test results will improve Outcome: Adequate for Discharge   Problem: Safety: Goal: Verbalization of understanding the information provided will improve Outcome: Adequate for Discharge   Problem: Self-Concept: Goal: Level of anxiety will decrease Outcome: Adequate for Discharge Goal: Ability to verbalize feelings about condition will improve Outcome: Adequate for Discharge   Problem: Education: Goal: Knowledge of General Education information will improve Description: Including pain rating scale, medication(s)/side effects and non-pharmacologic comfort measures Outcome: Adequate for Discharge   Problem: Health Behavior/Discharge Planning: Goal: Ability to manage health-related needs will improve Outcome: Adequate for Discharge   Problem: Clinical Measurements: Goal: Ability to maintain clinical measurements within normal limits will improve Outcome: Adequate for Discharge Goal: Will remain free from infection Outcome: Adequate for Discharge Goal: Diagnostic test results will improve Outcome: Adequate for Discharge Goal: Respiratory complications will improve Outcome:  Adequate for Discharge Goal: Cardiovascular complication will be avoided Outcome: Adequate for Discharge   Problem: Activity: Goal: Risk for activity intolerance will decrease Outcome: Adequate for Discharge   Problem: Nutrition: Goal: Adequate nutrition will be maintained Outcome: Adequate for Discharge   Problem: Coping: Goal: Level of anxiety will decrease Outcome: Adequate for Discharge   Problem: Elimination: Goal: Will not experience complications related to bowel motility Outcome: Adequate for Discharge Goal: Will not experience complications related to urinary retention Outcome: Adequate for Discharge   Problem: Safety: Goal: Ability to remain free from injury will improve Outcome: Adequate for Discharge   Problem: Pain Managment: Goal: General experience of comfort will improve Outcome: Adequate for Discharge   Problem: Skin Integrity: Goal: Risk for impaired skin integrity will decrease Outcome: Adequate for Discharge

## 2020-05-31 NOTE — Discharge Instructions (Signed)
Managing Non-Epileptic Seizures, Adult A non-epileptic seizure is an event that can cause abnormal movements or a loss of consciousness. It is not caused by abnormal electrical and chemical activity in the brain (epileptic seizure). Non-epileptic seizures are caused by an underlying problem with body function (physiologic) or a mental health disorder (psychogenic). If you have been diagnosed with non-epileptic seizures, you can do things to manage your symptoms. You may have to try different things to see what works best for you. Your health care provider may also give you specific instructions. How does this condition affect me? If your seizures are physiologic, your health care provider will treat the cause. These seizures are not likely to return, and they do not need further treatment. If you have psychogenic non-epileptic seizures (PNES), it is very important to understand that your PNES is a real illness. Your seizures are not fake. They just have a different cause than other seizures. PNES can be treated. Work with your mental health provider to find a treatment that works for you. Talk with your health care provider about what activities are safe to do until your seizures are controlled. What actions can I take to manage the condition? Create a plan for how to deal with your seizures.  After a seizure, make notes about what was associated with the stress that led to the seizure. Then, create a plan to manage this stress.  Think of ways to change the stresses you cannot avoid. How to manage lifestyle changes Managing stress  Certain types of counseling can be very helpful for managing stress. A mental health provider can assess what other treatments may also help you, such as: ? Talk therapy (cognitive behavioral therapy, or CBT). Through CBT, you will learn to identify and manage the psychological distress that leads to seizures. ? Medicine to treat depression or anxiety. ? Biofeedback.  This uses signals from your body (physiologic responses) to help you learn to regulate anxiety.  Consider self-care strategies to lower stress levels, such as: ? Doing breathing exercises, yoga, or meditation. ? Listening to music. ? Doing recreational therapy or organized exercise. ? Giving yourself calming messages. ? Calling a friend to talk about your stress.   Relationships You may benefit from talking with a trusted friend or family member about your thoughts and feelings.   General instructions Learn as much as you can about your non-epileptic seizures.  Consider educating the people in your life about your condition. These should be trusted family members and others who spend time with you at work, school, or home.  Ask for the emotional support you would like.  Keep all follow-up visits. This is important. Follow these instructions at home: Medicines Medicines may be prescribed to treat depression or anxiety that causes non-epileptic seizures. Avoid using alcohol and other substances that may prevent your medicines from working properly (may interact). It is also important to:  Talk with your pharmacist or health care provider about all the medicines that you take, their possible side effects, and what medicines are safe to take together.  Make it your goal to take part in all treatment decisions (shared decision-making). Ask about possible side effects of medicines that your health care provider recommends, and tell him or her how you feel about having those side effects. It is best if shared decision-making with your health care provider is part of your total treatment plan.  Take over-the-counter and prescription medicines only as told by your health care provider. General instructions    Ask your health care provider if it is safe for you to drive.  Return to your normal activities as told by your health care provider. Ask your health care provider what activities are safe  for you. These include working and playing sports.  Follow all instructions from your health care provider. These may include ways to prevent seizures and what to do if you have a seizure.  Eat a balanced diet.  Make sure you get full nights of sleep and regular daily exercise.  Keep all follow-up visits. This is important. Where to find support You can get support for managing non-epileptic seizures from support groups, either online or in-person. Your health care provider may be able to recommend a support group in your area. Where to find more information  Epilepsy Foundation: epilepsy.com  American Epilepsy Society: aesnet.org Contact a health care provider if:  Your seizures change or happen more often.  You continue to have seizures after treatment.  You show signs of depression or anxiety.  You have trouble doing your normal daily routine or work schedule. Get help right away if:  You injure yourself during a seizure.  You have one seizure after another.  You have trouble recovering from a seizure.  You have chest pain or trouble breathing.  A seizure lasts longer than 5 minutes.  You think about harming yourself or others. These symptoms may represent a serious problem that is an emergency. Do not wait to see if the symptoms will go away. Get medical help right away. Call your local emergency services (911 in the [place] Do not drive yourself to the hospital. If you ever feel like you may hurt yourself or others, or have thoughts about taking your own life, get help right away. Go to your nearest emergency department or:  Call your local emergency services (911 in the [place]  Call a suicide crisis helpline, such as the National Suicide Prevention Lifeline at 1-800-273-8255. This is open 24 hours a day in the [place]  Text the Crisis Text Line at 741741 (in the [place] Summary  The two types of non-epileptic seizures are physiologic non-epileptic seizures and  psychogenic non-epileptic seizures.  Work with your mental health provider to find a treatment that works for you.  Learning to recognize and avoid stress is an important part of preventing non-epileptic seizures. This information is not intended to replace advice given to you by your health care provider. Make sure you discuss any questions you have with your health care provider. Document Revised: 07/13/2019 Document Reviewed: 07/13/2019 Elsevier Patient Education  2021 Else[employer] Inc. Seizure, Adult A seizure is a sudden burst of abnormal electrical and chemical activity in the brain. Seizures usually last from 30 seconds to 2 minutes.  What are the causes? Common causes of this condition include:  Fever or infection.  Problems that affect the brain. These may include: ? A brain or head injury. ? Bleeding in the brain. ? A brain tumor.  Low levels of blood sugar or salt.  Kidney problems or liver problems.  Conditions that are passed from parent to child (are inherited).  Problems with a substance, such as: ? Having a reaction to a drug or a medicine. ? Stopping the use of a substance all of a sudden (withdrawal).  A stroke.  Disorders that affect how you develop. Sometimes, the cause may not be known.  What increases the risk?  Having someone in your family who has epilepsy. In this condition, seizures happen again   and again over time. They have no clear cause.  Having had a tonic-clonic seizure before. This type of seizure causes you to: ? Tighten the muscles of the whole body. ? Lose consciousness.  Having had a head injury or strokes before.  Having had a lack of oxygen at birth. What are the signs or symptoms? There are many types of seizures. The symptoms vary depending on the type of seizure you have. Symptoms during a seizure  Shaking that you cannot control (convulsions) with fast, jerky movements of muscles.  Stiffness of the body.  Breathing  problems.  Feeling mixed up (confused).  Staring or not responding to sound or touch.  Head nodding.  Eyes that blink, flutter, or move fast.  Drooling, grunting, or making clicking sounds with your mouth  Losing control of when you pee or poop. Symptoms before a seizure  Feeling afraid, nervous, or worried.  Feeling like you may vomit.  Feeling like: ? You are moving when you are not. ? Things around you are moving when they are not.  Feeling like you saw or heard something before (dj vu).  Odd tastes or smells.  Changes in how you see. You may see flashing lights or spots. Symptoms after a seizure  Feeling confused.  Feeling sleepy.  Headache.  Sore muscles. How is this treated? If your seizure stops on its own, you will not need treatment. If your seizure lasts longer than 5 minutes, you will normally need treatment. Treatment may include:  Medicines given through an IV tube.  Avoiding things, such as medicines, that are known to cause your seizures.  Medicines to prevent seizures.  A device to prevent or control seizures.  Surgery.  A diet low in carbohydrates and high in fat (ketogenic diet). Follow these instructions at home: Medicines  Take over-the-counter and prescription medicines only as told by your doctor.  Avoid foods or drinks that may keep your medicine from working, such as alcohol. Activity  Follow instructions about driving, swimming, or doing things that would be dangerous if you had another seizure. Wait until your doctor says it is safe for you to do these things.  If you live in the [place] ask your local department of motor vehicles when you can drive.  Get a lot of rest. Teaching others  Teach friends and family what to do when you have a seizure. They should: ? Help you get down to the ground. ? Protect your head and body. ? Loosen any clothing around your neck. ? Turn you on your side. ? Know whether or not you need  emergency care. ? Stay with you until you are better.  Also, tell them what not to do if you have a seizure. Tell them: ? They should not hold you down. ? They should not put anything in your mouth.   General instructions  Avoid anything that gives you seizures.  Keep a seizure diary. Write down: ? What you remember about each seizure. ? What you think caused each seizure.  Keep all follow-up visits. Contact a doctor if:  You have another seizure or seizures. Call the doctor each time you have a seizure.  The pattern of your seizures changes.  You keep having seizures with treatment.  You have symptoms of being sick or having an infection.  You are not able to take your medicine. Get help right away if:  You have any of these problems: ? A seizure that lasts longer than 5 minutes. ?   Many seizures in a row and you do not feel better between seizures. ? A seizure that makes it harder to breathe. ? A seizure and you can no longer speak or use part of your body.  You do not wake up right after a seizure.  You get hurt during a seizure.  You feel confused or have pain right after a seizure. These symptoms may be an emergency. Get help right away. Call your local emergency services (911 in the [place]  Do not wait to see if the symptoms will go away.  Do not drive yourself to the hospital. Summary  A seizure is a sudden burst of abnormal electrical and chemical activity in the brain. Seizures normally last from 30 seconds to 2 minutes.  Causes of seizures include illness, injury to the head, low levels of blood sugar or salt, and certain conditions.  Most seizures will stop on their own in less than 5 minutes. Seizures that last longer than 5 minutes are a medical emergency and need treatment right away.  Many medicines are used to treat seizures. Take over-the-counter and prescription medicines only as told by your doctor. This information is not intended to replace  advice given to you by your health care provider. Make sure you discuss any questions you have with your health care provider. Document Revised: 07/09/2019 Document Reviewed: 07/09/2019 Elsevier Patient Education  2021 Elsevier Inc.  

## 2020-05-31 NOTE — Progress Notes (Signed)
Patient currently waiting transport.dicharge home via [facility], AVS printed and given to patient, discharge instructions reviewed over the phone with pt's sister [name]).

## 2020-05-31 NOTE — Progress Notes (Addendum)
Occupational Therapy Treatment Patient Details Name: [name] Cruz MRN: [id number] DOB: [birth year] Today's Date: 05/31/2020    History of present illness 65 y.o. male with known history of stroke, seizures. Admitted Status epilepticus. Found to have aspiration pneumonia after szr. Hx Lt sided weakness from prior CVA.   OT comments  Patient seen with PT. Continues to require +2 max assist for stand pivot transfers.  Patient requires min assist for static sitting balance at EOB, and overall max-total assist for ADLs. Limited by cognitive deficits and requires multimodal cueing for task attention, sequencing and problem solving. Plans to dc home with sisters support today.  Per case manager, pt using lift at home for transfers with assist from sister.  If family can support increased assist at home, recommend HHOT but otherwise recommend SNF.  Will follow.    Follow Up Recommendations  Supervision/Assistance - 24 hour;Home health OT (declining SNF)    Equipment Recommendations  3 in 1 bedside commode    Recommendations for Other Services      Precautions / Restrictions Precautions Precautions: Fall;Other (comment) (seizure) Restrictions Weight Bearing Restrictions: No       Mobility Bed Mobility Overal bed mobility: Needs Assistance Bed Mobility: Supine to Sit     Supine to sit: Max assist;+2 for physical assistance;+2 for safety/equipment     General bed mobility comments: pt able to initate movement of BLEs towards EOB but requires max multimdoal cueing to attend to task and overall max assist +2 to progress trunk and scoot forward    Transfers Overall transfer level: Needs assistance Equipment used: 2 person hand held assist Transfers: Sit to/from Stand;Stand Pivot Transfers Sit to Stand: Max assist;+2 physical assistance;+2 safety/equipment Stand pivot transfers: Max assist;+2 physical assistance;+2 safety/equipment       General transfer comment: sit to stand with  max assist, increased cueing to translate forward to avoid bracing on bed; pivoting with max cueing and assist for weightshifting pivot to recliner    Balance Overall balance assessment: Needs assistance Sitting-balance support: Feet supported;Single extremity supported Sitting balance-Leahy Scale: Poor Sitting balance - Comments: up to min assist statically, R lateral lean with cueing to correct to midline Postural control: Right lateral lean Standing balance support: Bilateral upper extremity supported;During functional activity Standing balance-Leahy Scale: Poor Standing balance comment: needs +2 assist in standig                           ADL either performed or assessed with clinical judgement   ADL Overall ADL's : Needs assistance/impaired                         Toilet Transfer: Maximal assistance;+2 for safety/equipment;+2 for physical assistance Toilet Transfer Details (indicate cue type and reason): simulated to recliner         Functional mobility during ADLs: Maximal assistance;+2 for physical assistance;+2 for safety/equipment       Vision       Perception     Praxis      Cognition Arousal/Alertness: Awake/alert Behavior During Therapy: Impulsive Overall Cognitive Status: Impaired/Different from baseline Area of Impairment: Orientation;Following commands;Safety/judgement;Problem solving;Attention;Memory;Awareness                 Orientation Level: Disoriented to;Time;Situation Current Attention Level: Focused Memory: Decreased recall of precautions;Decreased short-term memory Following Commands: Follows one step commands inconsistently;Follows one step commands with increased time Safety/Judgement: Decreased awareness of deficits;Decreased awareness of safety  Awareness: Intellectual Problem Solving: Slow processing;Decreased initiation;Requires verbal cues;Difficulty sequencing;Requires tactile cues General Comments: pt  oriented to self, not time; difficulty following simple commands, highly distractable and requires constant redirection to task. Mulitmodal cueing requried.        Exercises     Shoulder Instructions       General Comments VSS    Pertinent Vitals/ Pain       Pain Assessment: No/denies pain  Home Living                                          Prior Functioning/Environment              Frequency  Min 2X/week        Progress Toward Goals  OT Goals(current goals can now be found in the care plan section)  Progress towards OT goals: Progressing toward goals  Acute Rehab OT Goals Patient Stated Goal: to go home OT Goal Formulation: With patient Time For Goal Achievement: 06/11/20 Potential to Achieve Goals: Good  Plan Discharge plan remains appropriate;Frequency remains appropriate    Co-evaluation                 AM-PAC OT "6 Clicks" Daily Activity     Outcome Measure   Help from another person eating meals?: A Lot Help from another person taking care of personal grooming?: A Lot Help from another person toileting, which includes using toliet, bedpan, or urinal?: Total Help from another person bathing (including washing, rinsing, drying)?: Total Help from another person to put on and taking off regular upper body clothing?: Total Help from another person to put on and taking off regular lower body clothing?: Total 6 Click Score: 8    End of Session Equipment Utilized During Treatment: Gait belt  OT Visit Diagnosis: Unsteadiness on feet (R26.81);Other abnormalities of gait and mobility (R26.89);Muscle weakness (generalized) (M62.81);Other symptoms and signs involving cognitive function;Hemiplegia and hemiparesis Hemiplegia - Right/Left: Left Hemiplegia - dominant/non-dominant: Non-Dominant Hemiplegia - caused by: Cerebral infarction   Activity Tolerance Patient tolerated treatment well   Patient Left in chair;with call bell/phone  within reach;with chair alarm set   Nurse Communication Mobility status        Time: 4259-5638 OT Time Calculation (min): 24 min  Charges: OT General Charges $OT Visit: 1 Visit OT Treatments $Self Care/Home Management : 8-22 mins  Barry Brunner, OT Acute Rehabilitation Services Pager [phone] Office [phone]    Chancy Milroy 05/31/2020, 3:53 PM

## 2020-05-31 NOTE — Progress Notes (Signed)
Physical Therapy Treatment Patient Details Name: [name] Cruz MRN: [id number] DOB: Jul 09, 1955 Today's Date: 05/31/2020    History of Present Illness 65 y.o. male with known history of stroke, seizures. Admitted Status epilepticus. Found to have aspiration pneumonia after szr. Hx Lt sided weakness from prior CVA.    PT Comments    Patient seen in co-tx with OT. Patient continues to require maxA+2 for mobility. Assist required for weightshifting to complete stand pivot transfer. Patient requires multimodal cueing due to impaired cognition and distractibility. Plans to DC home with [facility] services. Per case manager, patient using lift at home for transfers with sister. Continue to recommend SNF to maximize functional mobility and safety.     Follow Up Recommendations  SNF     Equipment Recommendations  None recommended by PT    Recommendations for Other Services       Precautions / Restrictions Precautions Precautions: Fall;Other (comment) Precaution Comments: seizure Restrictions Weight Bearing Restrictions: No    Mobility  Bed Mobility Overal bed mobility: Needs Assistance Bed Mobility: Supine to Sit     Supine to sit: Max assist;+2 for physical assistance;+2 for safety/equipment     General bed mobility comments: pt able to initate movement of BLEs towards EOB but requires max multimdoal cueing to attend to task and overall max assist +2 to progress trunk and scoot forward    Transfers Overall transfer level: Needs assistance Equipment used: 2 person hand held assist Transfers: Sit to/from [employer] Transfers Sit to Stand: Max assist;+2 physical assistance;+2 safety/equipment Stand pivot transfers: Max assist;+2 physical assistance;+2 safety/equipment       General transfer comment: Multimodal cues for sequencing. MaxA+2 for power up into standing with patient bracing LEs against bed for stability. Assist for weightshifting to advance LEs towards  recliner.  Ambulation/Gait             General Gait Details: deferred   Stairs             Wheelchair Mobility    Modified Rankin (Stroke Patients Only)       Balance Overall balance assessment: Needs assistance Sitting-balance support: Feet supported;Single extremity supported Sitting balance-Leahy Scale: Poor Sitting balance - Comments: up to min assist statically, R lateral lean with cueing to correct to midline Postural control: Right lateral lean Standing balance support: Bilateral upper extremity supported;During functional activity Standing balance-Leahy Scale: Poor Standing balance comment: needs +2 assist in standing                            Cognition Arousal/Alertness: Awake/alert Behavior During Therapy: Impulsive Overall Cognitive Status: Impaired/Different from baseline Area of Impairment: Orientation;Following commands;Safety/judgement;Problem solving;Attention;Memory;Awareness                 Orientation Level: Disoriented to;Time;Situation Current Attention Level: Focused Memory: Decreased recall of precautions;Decreased short-term memory Following Commands: Follows one step commands inconsistently;Follows one step commands with increased time Safety/Judgement: Decreased awareness of deficits;Decreased awareness of safety Awareness: Intellectual Problem Solving: Slow processing;Decreased initiation;Requires verbal cues;Difficulty sequencing;Requires tactile cues General Comments: pt oriented to self, not time; difficulty following simple commands, highly distractable and requires constant redirection to task. Mulitmodal cueing requried.      Exercises General Exercises - Lower Extremity Long Arc Quad: AROM;Both;5 reps;Seated Hip Flexion/Marching: AROM;Both;5 reps;Seated    General Comments General comments (skin integrity, edema, etc.): VSS      Pertinent Vitals/Pain Pain Assessment: No/denies pain    Home Living  Prior Function            PT Goals (current goals can now be found in the care plan section) Acute Rehab PT Goals Patient Stated Goal: to go home PT Goal Formulation: Patient unable to participate in goal setting Time For Goal Achievement: 06/10/20 Potential to Achieve Goals: Good Progress towards PT goals: Progressing toward goals    Frequency    Min 4X/week      PT Plan Discharge plan needs to be updated    Co-evaluation              AM-PAC PT "6 Clicks" Mobility   Outcome Measure  Help needed turning from your back to your side while in a flat bed without using bedrails?: A Lot Help needed moving from lying on your back to sitting on the side of a flat bed without using bedrails?: A Lot Help needed moving to and from a bed to a chair (including a wheelchair)?: Total Help needed standing up from a chair using your arms (e.g., wheelchair or bedside chair)?: Total Help needed to walk in hospital room?: Total Help needed climbing 3-5 steps with a railing? : Total 6 Click Score: 8    End of Session Equipment Utilized During Treatment: Gait belt Activity Tolerance: Patient tolerated treatment well Patient left: in chair;with call bell/phone within reach;with chair alarm set Nurse Communication: Mobility status PT Visit Diagnosis: Difficulty in walking, not elsewhere classified (R26.2);Other symptoms and signs involving the nervous system (R29.898);Hemiplegia and hemiparesis Hemiplegia - Right/Left: Left Hemiplegia - dominant/non-dominant: Non-dominant Hemiplegia - caused by:  (Chronic)     Time: 9242-6834 PT Time Calculation (min) (ACUTE ONLY): 23 min  Charges:  $Therapeutic Activity: 8-22 mins                     [name] Cruz A. Dan Humphreys PT, DPT Acute Rehabilitation Services Pager [phone] Office [phone]    [name] Cruz 05/31/2020, 4:52 PM

## 2020-05-31 NOTE — Progress Notes (Signed)
Patient was seen and examined at bedside.  No interval changes.   Vitals:   05/31/20 0356 05/31/20 0844  BP: (!) 102/55 107/64  Pulse: (!) 57 64  Resp: 19 18  Temp: 97.6 F (36.4 C) 98.3 F (36.8 C)  SpO2: 98% 98%    Discharge summary has been made 05/30/2020.  No changes have been made.  Stable for discharge home with [facility].

## 2020-05-31 NOTE — TOC Transition Note (Signed)
Transition of Care [facility]) - CM/SW Discharge Note   Patient Details  Name: [name] Cruz MRN: [id number] Date of Birth: March 13, 1955  [facility] of [facility]) CM/SW Contact:  Kermit Balo, RN Phone Number: 05/31/2020, 11:50 AM   Clinical Narrative:    Patient is discharging home today with [facility] services through [place]. Cory with [facility] accepted the referral. Pt has all needed DME at home. CM spoke to patients sister: Steward Drone and she is requesting PTAR transport home. She states pts mother and other sister are at the home to receive the patient. Address verified and PTAR arranged. D/c packet at the desk and bedside RN updated.   Final next level of care: Home w Home Health Services Barriers to Discharge: No Barriers Identified   Patient Goals and CMS Choice   CMS Medicare.gov Compare Post Acute Care list provided to:: Patient Represenative (must comment) Choice offered to / list presented to : Sibling  Discharge Placement                       Discharge Plan and Services   Discharge Planning Services: CM Consult Post Acute Care Choice: Home Health                    HH Arranged: PT,OT,Speech Therapy HH Agency: [facility] Date [facility] Agency Contacted: 05/31/20   Representative spoke with at [facility] Agency: Kandee Keen  Social Determinants of Health (SDOH) Interventions     Readmission Risk Interventions No flowsheet data found.

## 2020-06-01 ENCOUNTER — Telehealth: Payer: Self-pay

## 2020-06-01 NOTE — Telephone Encounter (Signed)
Transition Care Management Unsuccessful Follow-up Telephone Call  Date of discharge and from where:  05/31/2020, [facility]   Attempts:  2nd Attempt  Reason for unsuccessful TCM follow-up call:  No answer/busy

## 2020-06-01 NOTE — Telephone Encounter (Signed)
Transition Care Management Unsuccessful Follow-up Telephone Call  Date of discharge and from where:  05/31/2020, [facility]   Attempts:  1st Attempt  Reason for unsuccessful TCM follow-up call:  Left voice message

## 2020-06-02 ENCOUNTER — Other Ambulatory Visit: Payer: Self-pay | Admitting: Adult Health

## 2020-06-02 DIAGNOSIS — I69354 Hemiplegia and hemiparesis following cerebral infarction affecting left non-dominant side: Secondary | ICD-10-CM | POA: Diagnosis not present

## 2020-06-02 DIAGNOSIS — I610 Nontraumatic intracerebral hemorrhage in hemisphere, subcortical: Secondary | ICD-10-CM | POA: Diagnosis not present

## 2020-06-02 DIAGNOSIS — G40909 Epilepsy, unspecified, not intractable, without status epilepticus: Secondary | ICD-10-CM

## 2020-06-02 NOTE — Telephone Encounter (Signed)
Transition Care Management Unsuccessful Follow-up Telephone Call  Date of discharge and from where:  05/31/2020, [facility]   Attempts:  3rd Attempt  Reason for unsuccessful TCM follow-up call:  Left voice message

## 2020-06-06 ENCOUNTER — Telehealth: Payer: Self-pay | Admitting: *Deleted

## 2020-06-06 NOTE — Telephone Encounter (Signed)
Noted! Thank you

## 2020-06-06 NOTE — Telephone Encounter (Signed)
[name] Cruz PT with [facility] called stating that patient was recently sent home from the hospital because of a seizure and pneumonia. [name] Cruz stated that she spent time with the patient today and his physical function is at baseline. [name] Cruz stated that she does not feel that patient needs PT at time time.

## 2020-06-14 ENCOUNTER — Telehealth: Payer: Self-pay

## 2020-06-14 NOTE — Chronic Care Management (AMB) (Addendum)
Chronic Care Management Pharmacy Assistant   Name: [name] Cruz  MRN: [id number] DOB: [birth year]  Reason for Encounter: Disease State General Adherence   Recent office visits:  04/17/20-Dr.Gutierrez PCP Telemedicine started Mupirocin Calcium 2% apply topically 2 times daily if no better than try OTC Lotrimin cream apply 2 times daily for 1 week  Recent consult visits:  None since last CCM contact  Hospital visits:  Medication Reconciliation was completed by comparing discharge summary, patient's EMR and Pharmacy list, and upon discussion with patient. Admitted to the hospital on 05/25/2020 due to status epilepticus. Discharge date was 05/31/2020 from [facility].  Medication started: none  Medications discontinued: none  Medications changed: Depakote 250 mg increased to 3 tablets (750 mg) every 12 hours  Medications continued: All other medications remained the same.  Medications: Outpatient Encounter Medications as of 06/14/2020  Medication Sig   aspirin EC 81 MG tablet Take 1 tablet (81 mg total) by mouth daily. Swallow whole.   atorvastatin (LIPITOR) 80 MG tablet TAKE 1 TABLET BY MOUTH EVERY DAY (Patient taking differently: Take 80 mg by mouth daily.)   Cyanocobalamin (VITAMIN B-12) 1000 MCG SUBL Place 1,000 mcg under the tongue daily.   divalproex (DEPAKOTE) 250 MG DR tablet Take 3 tablets (750 mg total) by mouth every 12 (twelve) hours.   lacosamide (VIMPAT) 200 MG TABS tablet 1 tablet at 8 am and 1 tablet at 10 pm. (Patient taking differently: Take 200 mg by mouth 2 (two) times daily.)   levETIRAcetam (KEPPRA) 750 MG tablet TAKE 2 TABLETS (1,500 MG TOTAL) BY MOUTH 2 (TWO) TIMES DAILY.   Multiple Vitamin (MULTIVITAMIN WITH MINERALS) TABS tablet Take 1 tablet by mouth daily.   sertraline (ZOLOFT) 25 MG tablet TAKE 1 TABLET BY MOUTH EVERY DAY (Patient taking differently: Take 25 mg by mouth daily.)   ziprasidone (GEODON) 20 MG capsule Take 1 capsule (20 mg  total) by mouth at bedtime.   No facility-administered encounter medications on file as of 06/14/2020.    Contacted [facility] for general disease state and medication adherence call. Spoke with [name] sister and caregiver for the patient.  Patient is not > 5 days past due for refill on the following medications per chart history:  Star Medications: Medication Name/mg Last Fill Days Supply Atorvastatin 80mg   05/17/20  90  What concerns do you have about your medications? The sister states good results from current medications  The patient denies side effects with his medications. The sister states the patient has been compliant with the seizure medication and not had any episodes  How often do you forget or accidentally miss a dose? Never  Do you use a pillbox? Yes  Are you having any problems getting your medications from your pharmacy? No  Has the cost of your medications been a concern? No  Since last visit with CPP, no interventions have been made by CPP.  The patient has had an ED visit since last contact. Admitted to the hospital on 05/25/2020 due to altered mental status  Discharge date was 05/31/2020.   The patient denies problems with their health. The patient has not had any seizures or missed any medication doses  She denies  concerns or questions for 06/02/2020, Pharm. D at this time.    Counseled patient on: Patient's sister puts together the medications for the patient in a pillbox with am medications and pm medications. Denies missed doses. The sister was educated on [name]  if any interest in the future. Reminded to access the CCM team for any cost, medication, or pharmacy concerns.  No appointments scheduled within the next 30 days.  [name], CPP notified  Burt Knack, [facility] Clinical Pharmacy Assistant [phone]  I have reviewed the care management and care coordination activities outlined in this encounter and I am  certifying that I agree with the content of this note. No further action required.  [name], PharmD Clinical Pharmacist [facility] at [facility] [phone]

## 2020-07-03 DIAGNOSIS — I69354 Hemiplegia and hemiparesis following cerebral infarction affecting left non-dominant side: Secondary | ICD-10-CM | POA: Diagnosis not present

## 2020-07-03 DIAGNOSIS — I610 Nontraumatic intracerebral hemorrhage in hemisphere, subcortical: Secondary | ICD-10-CM | POA: Diagnosis not present

## 2020-07-06 ENCOUNTER — Telehealth: Payer: Self-pay | Admitting: Family Medicine

## 2020-07-06 NOTE — Telephone Encounter (Signed)
LVM for pt to rtn my call to schedule awv with nha. 

## 2020-07-19 ENCOUNTER — Telehealth: Payer: Self-pay | Admitting: Family Medicine

## 2020-07-19 NOTE — Telephone Encounter (Signed)
LVM for pt to rtn my call to schedule AWV with [facility].  

## 2020-08-02 DIAGNOSIS — I69354 Hemiplegia and hemiparesis following cerebral infarction affecting left non-dominant side: Secondary | ICD-10-CM | POA: Diagnosis not present

## 2020-08-02 DIAGNOSIS — I610 Nontraumatic intracerebral hemorrhage in hemisphere, subcortical: Secondary | ICD-10-CM | POA: Diagnosis not present

## 2020-08-24 ENCOUNTER — Other Ambulatory Visit: Payer: Self-pay | Admitting: Family Medicine

## 2020-08-24 ENCOUNTER — Telehealth: Payer: Self-pay

## 2020-08-24 NOTE — Telephone Encounter (Signed)
Sertraline Last filled:  05/24/20, #90 Last OV:  03/13/20, f/u Next OV:  none

## 2020-08-24 NOTE — Chronic Care Management (AMB) (Addendum)
    Chronic Care Management Pharmacy Assistant   Name: ARLO BUTT  MRN: [id number] DOB: 1955/06/07   Reason for Encounter: General Adherence    Recent office visits:  08/29/20 - PCP - telephone call regarding medication. Recommend slowly taper off sertraline and ziprasidone one medication at a time.  Recent consult visits:  09/04/20 - Neurology - telemedicine follow up seizures. Labs ordered (no results as of 8/25). Referral for palliative care.  Hospital visits:  None in previous 6 months  Medications: Outpatient Encounter Medications as of 08/24/2020  Medication Sig   aspirin EC 81 MG tablet Take 1 tablet (81 mg total) by mouth daily. Swallow whole.   atorvastatin (LIPITOR) 80 MG tablet TAKE 1 TABLET BY MOUTH EVERY DAY (Patient taking differently: Take 80 mg by mouth daily.)   Cyanocobalamin (VITAMIN B-12) 1000 MCG SUBL Place 1,000 mcg under the tongue daily.   divalproex (DEPAKOTE) 250 MG DR tablet Take 3 tablets (750 mg total) by mouth every 12 (twelve) hours.   lacosamide (VIMPAT) 200 MG TABS tablet 1 tablet at 8 am and 1 tablet at 10 pm. (Patient taking differently: Take 200 mg by mouth 2 (two) times daily.)   levETIRAcetam (KEPPRA) 750 MG tablet TAKE 2 TABLETS (1,500 MG TOTAL) BY MOUTH 2 (TWO) TIMES DAILY.   Multiple Vitamin (MULTIVITAMIN WITH MINERALS) TABS tablet Take 1 tablet by mouth daily.   sertraline (ZOLOFT) 25 MG tablet TAKE 1 TABLET BY MOUTH EVERY DAY (Patient taking differently: Take 25 mg by mouth daily.)   ziprasidone (GEODON) 20 MG capsule Take 1 capsule (20 mg total) by mouth at bedtime.   No facility-administered encounter medications on file as of 08/24/2020.   Attempted contact with [facility] 3 times on 08/24/20,08/31/20,09/06/20. Unsuccessful outreach. Will attempt contact next month.   Patient is not > 5 days past due for refill on the following medications per chart history:  Star Medications: Medication Name/mg Last Fill Days Supply Atorvastatin  80mg   08/14/20  90   CCM appointment on 11/06/2020   11/08/2020, CPP notified  [name], [facility] Pharm[facility] Assistant [phone]  I have reviewed the care management and care coordination activities outlined in this encounter and I am certifying that I agree with the content of this note. No further action required.  [phone], PharmD Clinical Pharmacist [facility] at [facility] [phone]

## 2020-08-25 NOTE — Telephone Encounter (Signed)
ERx 

## 2020-08-26 ENCOUNTER — Other Ambulatory Visit: Payer: Self-pay | Admitting: Family Medicine

## 2020-08-27 ENCOUNTER — Other Ambulatory Visit: Payer: Self-pay | Admitting: Adult Health

## 2020-08-28 ENCOUNTER — Telehealth: Payer: Self-pay | Admitting: Adult Health

## 2020-08-28 MED ORDER — DIVALPROEX SODIUM 250 MG PO DR TAB: 750.0000 mg | DELAYED_RELEASE_TABLET | Freq: Two times a day (BID) | ORAL | 5 refills | Status: DC

## 2020-08-28 NOTE — Telephone Encounter (Signed)
Refilled divalproex per NP. Called sister and LVM to inform her.

## 2020-08-28 NOTE — Telephone Encounter (Signed)
Pt's sister Steward Drone called requesting refill for divalproex (DEPAKOTE) 250 MG DR tablet. Pharmacy [phone].

## 2020-08-28 NOTE — Telephone Encounter (Signed)
Called sister and informed her Dr Rito Ehrlich sent in depakote Rx in May with 2 additional refills. She stated [facility] no [facility]. I told her i'll call [facility], call her back. She  verbalized understanding, appreciation. Called [facility] spoke with Revonda Standard who stated he is out of refills, Rx was for 3 months. Routed to NP for clarification as to who prescription MD should be.

## 2020-08-28 NOTE — Telephone Encounter (Signed)
Last refilled on 03/13/20 #90 with 1 refill  LOV 03/13/20 AWV No future appointments made

## 2020-08-28 NOTE — Telephone Encounter (Signed)
Recent refills by Dr. Rito Ehrlich as he was recently hospitalized with a recurrent seizure.  He has follow-up scheduled 08/2020. Okay to refill as we are managing his seizures and medications

## 2020-08-29 ENCOUNTER — Encounter: Payer: Self-pay | Admitting: Family Medicine

## 2020-08-29 NOTE — Telephone Encounter (Signed)
ERx 

## 2020-09-04 ENCOUNTER — Telehealth (INDEPENDENT_AMBULATORY_CARE_PROVIDER_SITE_OTHER): Payer: Medicare Other | Admitting: Adult Health

## 2020-09-04 ENCOUNTER — Encounter: Payer: Self-pay | Admitting: Adult Health

## 2020-09-04 DIAGNOSIS — R627 Adult failure to thrive: Secondary | ICD-10-CM

## 2020-09-04 DIAGNOSIS — G40909 Epilepsy, unspecified, not intractable, without status epilepticus: Secondary | ICD-10-CM

## 2020-09-04 DIAGNOSIS — Z8673 Personal history of transient ischemic attack (TIA), and cerebral infarction without residual deficits: Secondary | ICD-10-CM

## 2020-09-04 MED ORDER — LACOSAMIDE 200 MG PO TABS: 200.0000 mg | ORAL_TABLET | Freq: Two times a day (BID) | ORAL | 5 refills | Status: AC

## 2020-09-04 NOTE — Progress Notes (Signed)
[facility] [address] [place]. [place] [address] [phone]       FOLLOW UP NOTE  [name] Date of Birth:  [birth year] Medical Record Number:  [id number]   Reason for visit: seizures and stroke follow up  Virtual Visit via Video Note  I connected with [name] Cruz on 09/04/20 at 11:15 AM EDT by a video enabled telemedicine application located in office and verified that I am speaking with the correct person using two identifiers who was located at their own home accompanied by his sister who provided history.    [name] Maki, RN scheduled visit who discussed the limitations of evaluation and management by telemedicine and the availability of in person appointments. The patient/family expressed understanding and agreed to proceed.   SUBJECTIVE:   HPI:   [name] Cruz is a 65 y.o. male with PMHx significant for hx of multiple prior strokes (R BG hemorrhage conversion  2/2 known PAF not on [facility] 07/2019 with residual left hemiparesis, R PCA infarct 07/2018, R cerebellar infarct 06/2018 and L MCA infarct w/ hemorrhagic conversion 2004), atrial fibrillation, hx of DVT, seizure disorder, HTN, HLD, cognitive impairment (?  Vascular dementia), hx of tobacco use and EtOH use and CAD s/p CABG.  Today, 09/04/2020, [name] Cruz returns for 82-month follow-up via MyChart video visit after prior visit 6 months ago via MyChart video visit.  Unfortunately, he had a hospital admission 5/12 - 5/18 for status and per sister, since that time, he has been gradually declining in regards to overall functioning.  He was initially working with [facility] therapies but due to lack of progress and plateauing, therapies discontinued.  He refuses to get out of bed unless for showering, needs mechanical assistance for standing and transfers, incontinent of both urine and stool and needs help with feeding. Sister reports he sleeps majority of the day and sleeps well at night. He is cared for by his 2  sisters and mother. Sister is concerned that he "is just giving up". Possible seizure last week - mother had a difficult time awakening him and once he opened his eyes, he had a fixed gaze that lasted approx 5 minutes. No other symptoms associated.  No other additional events. Currently on Depakote 750 mg twice daily, Vimpat 200 mg twice daily and Keppra 1500 mg twice daily. PCP recently d/c'd ziprasidone per family request and plan to stop sertaline in the near future if able as there was no noticeable benefit.  Denies new stroke/TIA symptoms.  Residual left hemiplegia, cognitive impairment and aphasia stable.  Remains on aspirin and atorvastatin.  Blood pressure routinely monitored which has been stable.  No recent lab work since discharge.  No further concerns at this time      ROS:   N/A  PMH:  Past Medical History:  Diagnosis Date   Acute deep vein thrombosis (DVT) of left upper extremity (HCC)    L brachial and basilic veins, eliquis started 08/22/2017 to continue for 3 months total    Cognitive impairment 2007   after stroke, saw rehab but told to stop because was too upsetting to him   History of chicken pox    HLD (hyperlipidemia)    HTN (hypertension)    NSTEMI (non-ST elevated myocardial infarction) (HCC) 02/21/2017   Obesity    Stroke, hemorrhagic (HCC) 2007   thought 2/2 HTN (240sbp); residual cognitive impairment, loss of R peripheral field, no driving    PSH:  Past Surgical History:  Procedure  Laterality Date   ANKLE SURGERY  1990s   right foot with plate and screws   CORONARY ARTERY BYPASS GRAFT N/A 02/24/2017   3v Procedure: CORONARY ARTERY BYPASS GRAFTING (CABG) x 3 ON PUMP USING LEFT INTERNAL MAMMARY ARTERY TO LEFT ANTERIOR DESENDING CORNARY ARTERY, RIGHT GREATER SAPHENOUS VEIN TO LEFT CIRCUMFLEX ARTERY AND POSTERIOR DESENDING ARTERY. RIGHT GREATER SAPHENOUS VEIN OBTAINED VIA ENDOVEIN HARVEST.;  Surgeon: Delight Ovens, MD   IABP INSERTION N/A 02/21/2017   Procedure:  IABP Insertion;  Surgeon: Yvonne Kendall, MD;  Location: [facility] INVASIVE CV LAB;  Service: Cardiovascular;  Laterality: N/A;   LEFT HEART CATH AND CORONARY ANGIOGRAPHY N/A 02/21/2017   Procedure: LEFT HEART CATH AND CORONARY ANGIOGRAPHY;  Surgeon: Yvonne Kendall, MD;  Location: [facility] INVASIVE CV LAB;  Service: Cardiovascular;  Laterality: N/A;   TEE WITHOUT CARDIOVERSION N/A 02/24/2017   Procedure: TRANSESOPHAGEAL ECHOCARDIOGRAM (TEE);  Surgeon: Delight Ovens, MD;  Location: [facility];  Service: Open Heart Surgery;  Laterality: N/A;    Social History:  Social History   Socioeconomic History   Marital status: Single    Spouse name: Not on file   Number of children: 2   Years of education: Not on file   Highest education level: 10th grade  Occupational History    Comment: Unemployed   Tobacco Use   Smoking status: Never   Smokeless tobacco: Former  Building services engineer Use: Never used  Substance and Sexual Activity   Alcohol use: Not Currently   Drug use: No   Sexual activity: Not Currently  Other Topics Concern   Not on file  Social History Narrative   Caffeine: 1 pot coffee   Lives with oldest sister [name] Cruz in [place], [place]   Occupation: disabled, was Corporate investment banker   Does not drive.   Activity: no regular exercise   Diet: good water, drinks V8 juice   Right handed       CareTeam:   Neurology - Dr Pearlean Brownie    Cardiology - Dr Catalina Gravel (last seen 11/2017)   Cardiothoracic surgery - Dr Tyrone Sage (last seen 03/2017)      Sister [name] Cruz is HCPOA and manages his medications.    Mother [name] Cruz, oldest sister [name] Cruz also care for him.    Also has younger sister [name] Cruz who is also involved   Social Determinants of Corporate investment banker Strain: Low Risk    Difficulty of Paying Living Expenses: Not very hard  Food Insecurity: Not on file  Transportation Needs: Not on file  Physical Activity: Not on file  Stress: Not on file  Social Connections: Not on file  Intimate Partner  Violence: Not on file    Family History:  Family History  Problem Relation Age of Onset   Alzheimer's disease Maternal Grandfather    Cancer Mother        lymphoma   Alcohol abuse Father        smoker   Coronary artery disease Neg Hx    Stroke Neg Hx    Diabetes Neg Hx     Medications:   Current Outpatient Medications on File Prior to Visit  Medication Sig Dispense Refill   aspirin EC 81 MG tablet Take 1 tablet (81 mg total) by mouth daily. Swallow whole. 30 tablet 11   atorvastatin (LIPITOR) 80 MG tablet TAKE 1 TABLET BY MOUTH EVERY DAY (Patient taking differently: Take 80 mg by mouth daily.) 90 tablet 3   Cyanocobalamin (VITAMIN B-12) 1000 MCG SUBL  Place 1,000 mcg under the tongue daily.     divalproex (DEPAKOTE) 250 MG DR tablet Take 3 tablets (750 mg total) by mouth every 12 (twelve) hours. 180 tablet 5   lacosamide (VIMPAT) 200 MG TABS tablet 1 tablet at 8 am and 1 tablet at 10 pm. (Patient taking differently: Take 200 mg by mouth 2 (two) times daily.) 60 tablet 5   levETIRAcetam (KEPPRA) 750 MG tablet TAKE 2 TABLETS (1,500 MG TOTAL) BY MOUTH 2 (TWO) TIMES DAILY. 360 tablet 2   Multiple Vitamin (MULTIVITAMIN WITH MINERALS) TABS tablet Take 1 tablet by mouth daily. 90 tablet 0   sertraline (ZOLOFT) 25 MG tablet TAKE 1 TABLET BY MOUTH EVERY DAY 90 tablet 3   ziprasidone (GEODON) 20 MG capsule TAKE 1 CAPSULE BY MOUTH AT BEDTIME. 90 capsule 1   No current facility-administered medications on file prior to visit.    Allergies:   Allergies  Allergen Reactions   Losartan Other (See Comments)    hyperkalemia      OBJECTIVE:  Physical Exam N/A d/t visit type   Pertinent imaging   CT HEAD  CTA HEAD/NECK 05/25/2020  IMPRESSION: CT HEAD IMPRESSION:   1. No acute intracranial abnormality. 2. Chronic left temporoparietal and right basal ganglia infarcts. 3. Underlying atrophy with chronic microvascular ischemic disease.   CTA HEAD AND NECK IMPRESSION:   1. Negative  CTA for large vessel occlusion. 2. Atheromatous change about the carotid bifurcations with up to 50% stenosis on the right. 3. Moderate atheromatous stenosis at the supraclinoid left ICA. 4. Moderate estimated 50% stenoses at the origins of both vertebral arteries. 5. Hazy and patchy opacity within the posterior right upper lobe, which could reflect atelectasis or infiltrate. Layering secretions/debris seen within the subglottic trachea and right mainstem bronchus, raising the possibility for aspiration.   OVERNIGHT EEG 05/26/2020 IMPRESSION: This study showed four seizures on 05/26/2020 at 0424, 0521, 0707 and 0943 during which patient was noted to be laying in bed, looking towards right, arising from right frontotemporal region, average duration 45 seconds to 1.5 mins.   There is evidence of epileptogenicity and cortical dysfunction in right hemisphere likely secondary to underlying structural abnormality.  Additionally, there is nonspecific cortical dysfunction in left temporal region as well as mild diffuse encephalopathy, nonspecific etiology but likely secondary to ictal-postictal state.           ASSESSMENT/PLAN: [name] Cruz is a 65 y.o. year old male with PMHx significant for history of multiple hemorrhagic and ischemic strokes, poststroke seizures, atrial fibrillation not on AC, HTN, HLD, CAD s/p CABG     Seizures, post stroke:  Initial onset 07/2017.   Recent hospitalization for status 05/2020.  Depakote dosage increased, remained on Vimpat and Keppra seizure 2/17 in setting of running out of Depakote with prior unprovoked seizure 06/2019 on Vimpat and Keppra therefore Depakote initiated in ED.  Continue current dosage of AEDs -Depakote 750 mg twice daily, Vimpat 200 mg twice daily and Keppra 1500 mg twice daily Lab work needs to be completed - order will be placed to have labs completed at home Hx of multiple strokes:  R BG hemorrhagic stroke 07/2019: ?  Hemorrhagic  conversion given history of A. Fib not on AC R PCA stroke 07/2018 2/2 small vessel disease Right cerebellum stroke 06/2018 2/2 small vessel disease L MCA stroke with hemorrhagic conversion 2004 Residual deficit: Left hemiplegia, speech and language impairment, cognitive impairment and right homonymous hemianopia.  Continue aspirin 81 mg daily and atorvastatin  for secondary stroke prevention.   Discussed secondary stroke prevention measures and importance of close PCP follow up for aggressive stroke risk factor management including HTN with BP goal<130/90, and HLD with LDL goal<70 Atrial fibrillation: CHA2DS2-VASc score of at least 5.  Not on AC d/t hx of hemorrhagic conversion and multiple microhemorrhages as well as prior strokes small vessel disease etiology and not in setting of A. fib.  07/2019 R BG ICH with concern of hemorrhagic conversion in setting of A. Fib - prior discussion with Dr. Pearlean Brownie who recommended use of aspirin 81 mg daily at this time as risk of starting [facility] may outweigh benefit.  Failure to thrive: likely multifactorial.  Multiple comorbidities with recurrent seizures and multiple strokes. Will obtain lab work via [facility].  Referral placed to palliative care as sister would like to focus on quality vs quantity of life and discuss goals of care.      Follow up in 4 months or call earlier if needed  CC:  [facility] provider: Dr. Cannon Kettle, Wynona Canes, MD     I spent 36 minutes of face-to-face and non-face-to-face time with patient and sister via MyChart video visit.  This included previsit chart review including hospitalization pertinent progress notes, lab work and imaging, lab review, study review, order entry, electronic health record documentation, sister/patient education regarding recent break through seizure and use of medications, multiple prior strokes, residual deficits, secondary stroke prevention measures, importance of managing stroke risk factors and answered all other  questions to patient and sisters satisfaction   Ihor Austin, AGNP-BC  [facility] [address] [place], [place] [address]  Phone [phone] Fax [phone] Note: This document was prepared with digital dictation and possible smart phrase technology. Any transcriptional errors that result from this process are unintentional.

## 2020-09-05 NOTE — Progress Notes (Signed)
I agree with the above plan 

## 2020-09-13 ENCOUNTER — Telehealth: Payer: Self-pay | Admitting: Student

## 2020-09-13 NOTE — Telephone Encounter (Signed)
Attempted to contact patient's sister, [name], to offer to schedule a Palliative Consult - no answer.  Left message with reason for call along with my name and call back number, requesting return call.

## 2020-09-19 ENCOUNTER — Telehealth: Payer: Self-pay | Admitting: Student

## 2020-09-19 NOTE — Telephone Encounter (Signed)
Rec'd return call from patient's sister, [name], and we discussed the Palliative referral/services and all questions were answered and she was in agreement with scheduling visit.  I have scheduled an In-home Consult for 09/26/20 @ 3 PM

## 2020-09-21 ENCOUNTER — Other Ambulatory Visit: Payer: Self-pay | Admitting: Adult Health

## 2020-09-21 DIAGNOSIS — Z8673 Personal history of transient ischemic attack (TIA), and cerebral infarction without residual deficits: Secondary | ICD-10-CM

## 2020-09-21 DIAGNOSIS — R627 Adult failure to thrive: Secondary | ICD-10-CM

## 2020-09-21 DIAGNOSIS — G40909 Epilepsy, unspecified, not intractable, without status epilepticus: Secondary | ICD-10-CM

## 2020-09-25 ENCOUNTER — Telehealth: Payer: Self-pay | Admitting: Student

## 2020-09-25 NOTE — Telephone Encounter (Signed)
Palliative NP left message confirming appointment for tomorrow. Awaiting return call.

## 2020-09-26 ENCOUNTER — Other Ambulatory Visit: Payer: Medicare Other | Admitting: Student

## 2020-09-26 ENCOUNTER — Other Ambulatory Visit: Payer: Self-pay

## 2020-09-28 ENCOUNTER — Telehealth: Payer: Self-pay | Admitting: Student

## 2020-09-28 ENCOUNTER — Other Ambulatory Visit: Payer: Self-pay

## 2020-09-28 ENCOUNTER — Other Ambulatory Visit: Payer: Medicare Other | Admitting: Student

## 2020-09-28 DIAGNOSIS — R635 Abnormal weight gain: Secondary | ICD-10-CM

## 2020-09-28 DIAGNOSIS — Z515 Encounter for palliative care: Secondary | ICD-10-CM | POA: Diagnosis not present

## 2020-09-28 DIAGNOSIS — G40909 Epilepsy, unspecified, not intractable, without status epilepticus: Secondary | ICD-10-CM

## 2020-09-28 DIAGNOSIS — R059 Cough, unspecified: Secondary | ICD-10-CM | POA: Diagnosis not present

## 2020-09-28 DIAGNOSIS — I69391 Dysphagia following cerebral infarction: Secondary | ICD-10-CM | POA: Diagnosis not present

## 2020-09-28 DIAGNOSIS — I69354 Hemiplegia and hemiparesis following cerebral infarction affecting left non-dominant side: Secondary | ICD-10-CM | POA: Diagnosis not present

## 2020-09-28 NOTE — Telephone Encounter (Signed)
Rec'd return call from sister, Steward Drone, and she was in agreement with rescheduling the Palliative Consult for this afternoon at 2:30 PM.

## 2020-09-28 NOTE — Progress Notes (Signed)
[facility] Consult Note Telephone: [phone]  Fax: [phone]   Date of encounter: 09/28/20 2:44 PM PATIENT NAME: [name] Cruz [id number]   [phone] (home)  DOB: Jun 10, 1955 MRN: [id number] PRIMARY CARE PROVIDER:    Ria Bush, MD,  [place] [address] [phone]  REFERRING PROVIDER:   Frann Rider, NP  RESPONSIBLE PARTY:    Contact Information     Name Relation Home Work Mobile   [name] Cruz Sister [phone]   [name] Cruz [phone]  [phone]        I met face to face with patient and family in the home. Palliative Care was asked to follow this patient by consultation request of [name] Rider, NP to address advance care planning and complex medical decision making. This is the initial visit.                                     ASSESSMENT AND PLAN / RECOMMENDATIONS:   Advance Care Planning/Goals of Care: Goals include to maximize quality of life and symptom management. Patient/health care surrogate gave his/her permission to discuss.Our advance care planning conversation included a discussion about:    The value and importance of advance care planning  Experiences with loved ones who have been seriously ill or have died  Exploration of personal, cultural or spiritual beliefs that might influence medical decisions  Exploration of goals of care in the event of a sudden injury or illness  Sister [name] Cruz has HCPOA Discussed code status; patient will continue Full Code status. This will be an ongoing conversation. CODE STATUS: Full Code  Symptom Management/Plan:  Late Effect CVA with left sided hemiplegia-patient dependent for all ADLs except for feeding.  Patient with dysphagia-recommend speech therapy evaluation. Education provided on aspiration precautions.  Family to continue assisting with ADLs. Continue  aspirin and statin as scheduled. will refer to palliative social worker to see if patient will qualify for [facility] worker due to decline in physical function, additional support/care needed in the home. Patient to follow-up with neurology as scheduled.   Cough-patient with cough at night; discussed possible microaspiration. Encourage patient to sit up at least an hour after eating/drinking. Recommend Mucinex BID.   Seizure disorder-continue Vimpat, Keppra and Depakote as directed. Monitor for falls/safety. Follow up with neurology as scheduled.  Weight gain-routine labs requested per family. Will order CMP, CBC, TSH. Patient's decrease in physical functioning likely contributing to his weight gain.   Follow up Palliative Care Visit: Palliative care will continue to follow for complex medical decision making, advance care planning, and clarification of goals. Return in 6-8 weeks or prn.  I spent 60 minutes providing this consultation. More than 50% of the time in this consultation was spent in counseling and care coordination.   PPS: 40%  HOSPICE ELIGIBILITY/DIAGNOSIS: TBD  Chief Complaint: Palliative Medicine initial visit.   HISTORY OF PRESENT ILLNESS:  [name] is a 65 y.o. year old male  with multiple CVA's, left sided hemiparesis, atrial fibrillation, seizure disorder, hypertension, hyperlipidemia, Vascular dementia, CAD s/p CABG.   Patient resides at home with his mother, has good family support from sisters who also help provide daily care. Patient's mother reports patient having increased congestion and coughing at night. She has been administering cough suppressant.  Patient denies having any pain or shortness of breath during the  day; also denies constipation or nausea.  Patient mother states patient is eating well, while sister reports patient not eating as well.  Sister [name] Cruz states patient has gained at least 50 pounds over the last 6 to 7 months.  He has also had a notable  physical decline over this time. Patient has more difficulty transferring, and toileting. Hoyer lift is used to get patient out of bed. Patient was previously able to pedal self around in wheelchair. Patient now requires assistance with wheelchair locomotion. Patient had previously received [facility] therapy services, but discharged due to plateuing. Sister reports patient sleeping more throughout the day. Denies depression; although family feels he is more withdrawn. Occasional agitation reported. Questionable seizure in past month. No recent ED visits; last hospitalization in 05/2020.  Patient received sitting in wheelchair.  Patient has pleasant affect throughout visit.  Patient is able to answer some yes or no questions. Left hemiparesis noted with contracture to left hand. HPI and ROS primarily obtained from patient's family due to his impaired cognition.  History obtained from review of EMR, discussion with primary team, and interview with family, facility staff/caregiver and/or [name] Cruz.  I reviewed available labs, medications, imaging, studies and related documents from the EMR.  Records reviewed and summarized above.   ROS General: NAD EYES: denies vision changes ENMT: endorses dysphagia with solids Cardiovascular: denies chest pain Pulmonary: cough, denies increased SOB Abdomen: denies constipation GU: denies dysuria, incontinence of urine MSK: weakness,  no falls reported Skin: denies rashes or wounds Neurological: denies pain, denies insomnia Psych: Endorses positive mood Heme/lymph/immuno: denies bruises, abnormal bleeding  Physical Exam: Pulse 96, resp 16, b/p 130/80 Constitutional: NAD General: frail appearing, chronically ill appearing EYES: anicteric sclera, lids intact, no discharge  ENMT: intact hearing, oral mucous membranes moist, dentition intact CV: S1S2, RRR, trace LE edema Pulmonary: LCTA, no increased work of breathing, no cough, room air Abdomen: normo-active BS + 4  quadrants, soft and non tender GU: deferred MSK: left sided hemiparesis,non-ambulatory Skin: warm and dry, no rashes or wounds on visible skin Neuro: generalized weakness, Alert and oriented to person, familiars Psych: non-anxious affect, pleasant Hem/lymph/immuno: no widespread bruising CURRENT PROBLEM LIST:  Patient Active Problem List   Diagnosis Date Noted   Cough 03/16/2020   Bowel and bladder incontinence 10/22/2019   SAH (subarachnoid hemorrhage) (HCC)    Neurogenic bladder    Hemiparesis affecting left side as late effect of stroke (HCC)    Thalamic hemorrhage ([place]) 07/26/2019   History of DVT in adulthood 02/21/2019   PAD (peripheral artery disease) ([place]) 12/14/2018   Agitation 09/04/2018   Acute CVA (cerebrovascular accident) ([place]) 08/07/2018   Cerebellar stroke, acute ([place]) 07/10/2018   Status epilepticus ([place]) 03/25/2018   Unilateral occipital headache 10/03/2017   Ischemic stroke ([place]) 08/23/2017   Coronary artery disease involving coronary bypass graft of native heart without angina pectoris    Atrial fibrillation (HCC)    Dysphagia, post-stroke    Cardiac arrest ([place]) 08/09/2017   S/P CABG x 3 02/24/2017   NSTEMI (non-ST elevated myocardial infarction) ([place]) 02/21/2017   Encephalomalacia 10/28/2012   Seizure disorder ([place]) 04/26/2012   Alcohol abuse 04/26/2012   History of stroke with current residual effects    Cognitive impairment    HLD (hyperlipidemia)    HTN (hypertension)    PAST MEDICAL HISTORY:  Active Ambulatory Problems    Diagnosis Date Noted   History of stroke with current residual effects    Cognitive impairment    HLD (  hyperlipidemia)    HTN (hypertension)    Seizure disorder ([place]) 04/26/2012   Alcohol abuse 04/26/2012   Encephalomalacia 10/28/2012   NSTEMI (non-ST elevated myocardial infarction) ([place]) 02/21/2017   S/P CABG x 3 02/24/2017   Cardiac arrest ([place]) 08/09/2017   Coronary artery disease involving coronary bypass graft of  native heart without angina pectoris    Atrial fibrillation (HCC)    Dysphagia, post-stroke    Ischemic stroke ([place]) 08/23/2017   Unilateral occipital headache 10/03/2017   Status epilepticus ([place]) 03/25/2018   Cerebellar stroke, acute ([place]) 07/10/2018   Acute CVA (cerebrovascular accident) ([place]) 08/07/2018   Agitation 09/04/2018   PAD (peripheral artery disease) ([place]) 12/14/2018   History of DVT in adulthood 02/21/2019   Thalamic hemorrhage ([place]) 07/26/2019   SAH (subarachnoid hemorrhage) (HCC)    Neurogenic bladder    Hemiparesis affecting left side as late effect of stroke (HCC)    Bowel and bladder incontinence 10/22/2019   Cough 03/16/2020   Resolved Ambulatory Problems    Diagnosis Date Noted   Obesity, Class I, BMI 30-34.9    Medicare annual wellness visit, subsequent 10/17/2014   Health maintenance examination 10/17/2014   Advanced care planning/counseling discussion 10/17/2014   Atrial fibrillation with RVR ([place])    Acute respiratory failure with hypoxia (HCC)    On mechanically assisted ventilation (HCC)    Status epilepticus (HCC)    Pneumonia of both lungs due to infectious organism    Fever    History of ETT    Acute deep vein thrombosis (DVT) of left upper extremity (HCC)    Tachypnea    Neurologic deficit due to acute ischemic cerebrovascular accident (CVA) ([place])    Hyperkalemia 10/03/2017   Postictal state ([place]) 07/10/2018   CVA (cerebral vascular accident) ([place]) 08/08/2018   ICH (intracerebral hemorrhage) ([place]) 07/19/2019   Benign essential HTN    Labile blood pressure    Dermatitis associated with moisture from stool incontinence 08/17/2019   Past Medical History:  Diagnosis Date   History of chicken pox    Obesity    Stroke, hemorrhagic ([place]) 2007   SOCIAL HX:  Social History   Tobacco Use   Smoking status: Never   Smokeless tobacco: Former  Substance Use Topics   Alcohol use: Not Currently   FAMILY HX:  Family History  Problem Relation  Age of Onset   Alzheimer's disease Maternal Grandfather    Cancer Mother        lymphoma   Alcohol abuse Father        smoker   Coronary artery disease Neg Hx    Stroke Neg Hx    Diabetes Neg Hx       ALLERGIES:  Allergies  Allergen Reactions   Losartan Other (See Comments)    hyperkalemia     PERTINENT MEDICATIONS:  Outpatient Encounter Medications as of 09/28/2020  Medication Sig   aspirin EC 81 MG tablet Take 1 tablet (81 mg total) by mouth daily. Swallow whole.   atorvastatin (LIPITOR) 80 MG tablet TAKE 1 TABLET BY MOUTH EVERY DAY (Patient taking differently: Take 80 mg by mouth daily.)   Cyanocobalamin (VITAMIN B-12) 1000 MCG SUBL Place 1,000 mcg under the tongue daily.   divalproex (DEPAKOTE) 250 MG DR tablet Take 3 tablets (750 mg total) by mouth every 12 (twelve) hours.   lacosamide (VIMPAT) 200 MG TABS tablet Take 1 tablet (200 mg total) by mouth 2 (two) times daily.   levETIRAcetam (KEPPRA) 750 MG tablet TAKE 2  TABLETS (1,500 MG TOTAL) BY MOUTH 2 (TWO) TIMES DAILY.   Multiple Vitamin (MULTIVITAMIN WITH MINERALS) TABS tablet Take 1 tablet by mouth daily.   sertraline (ZOLOFT) 25 MG tablet TAKE 1 TABLET BY MOUTH EVERY DAY   No facility-administered encounter medications on file as of 09/28/2020.   Thank you for the opportunity to participate in the care of Mr. Finken.  The palliative care team will continue to follow. Please call our office at [phone] if we can be of additional assistance.   [name] Slocumb, NP   COVID-19 PATIENT SCREENING TOOL Asked and negative response unless otherwise noted:  Have you had symptoms of covid, tested positive or been in contact with someone with symptoms/positive test in the past 5-10 days? No

## 2020-10-03 ENCOUNTER — Telehealth: Payer: Self-pay

## 2020-10-03 NOTE — Telephone Encounter (Signed)
Request received from [facility], NP to obtain labs.   Phone call made to [name] Cruz to schedule an appointment.  No answer.  Message left.  Dx. Late effect CVA Labs: CBC, CMP, TSH

## 2020-10-03 NOTE — Telephone Encounter (Signed)
935 am.  Message received from [name] requesting a call back.  Return call made at 942 am.  Visit scheduled for blood work on Monday at 12 pm.

## 2020-10-04 ENCOUNTER — Telehealth: Payer: Self-pay

## 2020-10-04 NOTE — Progress Notes (Signed)
Faxed [facility] referral.

## 2020-10-04 NOTE — Telephone Encounter (Signed)
10/04/20 @9 :20 AM: Palliative care SW outreached patient siter/HCPOA, , to inquire about patients Medicaid benefits.   All unsuccessful, SW LVM.  @9 :40AM: Sister called back and LVM stating that patient has Medicaid MQB.  @10AM : Palliative care SW outreached sister again, call unsuccessful. SW made sister aware on VM that patients current  MCD does not cover in home assistance but that SW could do a PACE referral and allow PACE to outreach sister about their services.

## 2020-10-06 ENCOUNTER — Telehealth: Payer: Self-pay

## 2020-10-06 NOTE — Chronic Care Management (AMB) (Addendum)
    Chronic Care Management Pharmacy Assistant   Name: [name] Cruz  MRN: [id number] DOB: [birth year]   Reason for Encounter: Adherence Review   Recent office visits:  None since last CCM ocntact  Recent consult visits:  None since last CCM contact  Hospital visits:  None in previous 6 months  Medications: Outpatient Encounter Medications as of 10/06/2020  Medication Sig   aspirin EC 81 MG tablet Take 1 tablet (81 mg total) by mouth daily. Swallow whole.   atorvastatin (LIPITOR) 80 MG tablet TAKE 1 TABLET BY MOUTH EVERY DAY   Cyanocobalamin (VITAMIN B-12) 1000 MCG SUBL Place 1,000 mcg under the tongue daily.   divalproex (DEPAKOTE) 250 MG DR tablet Take 3 tablets (750 mg total) by mouth every 12 (twelve) hours.   lacosamide (VIMPAT) 200 MG TABS tablet Take 1 tablet (200 mg total) by mouth 2 (two) times daily.   levETIRAcetam (KEPPRA) 750 MG tablet TAKE 2 TABLETS (1,500 MG TOTAL) BY MOUTH 2 (TWO) TIMES DAILY.   Multiple Vitamin (MULTIVITAMIN WITH MINERALS) TABS tablet Take 1 tablet by mouth daily.   sertraline (ZOLOFT) 25 MG tablet TAKE 1 TABLET BY MOUTH EVERY DAY   No facility-administered encounter medications on file as of 10/06/2020.    Contacted [name] Cruz on 10/09/20 for general disease state and medication adherence call.   Patient is not > 5 days past due for refill on the following medications per chart history:  Star Medications: Medication Name/mg Last Fill Days Supply Atorvastatin 80mg   08/14/20  90  What concerns do you have about your medications? There has been a few concerns about the seizure medication with [facility] [address].  The patient denies side effects with his medications.   How often do you forget or accidentally miss a dose? Never  Do you use a pillbox? Yes  the sister, 10/14/20 puts out for the patient   Are you having any problems getting your medications from your pharmacy? No  issue with the seizure medication resolved  Has the cost of  your medications been a concern? No  Since last visit with CPP, no interventions have been made: Palliative Care will come to the home weekly for blood draws.  The patient has not had an ED visit since last contact.   The patient denies problems with their health. The patient seems to be bed bound now, has not got up to walk in over a year, per the sister [name] Cruz.  he denies  concerns or questions for [name] Cruz, PharmD at this time.   Counseled patient on:  [name] Cruz job taking medications, Importance of taking medication daily without missed doses, Benefits of adherence packaging or a pillbox, and Access to CCM team for any cost, medication or pharmacy concerns.   Care Gaps: Annual wellness visit in last year? Yes Most Recent BP reading: 126/75  91-P  109/91  99-P  05/26/20   CCM appointment on 11/06/20   confirmed appointment with the sister 11/08/20.  [name] Cruz, CPP notified  [name] Cruz, [facility] Clincal Pharmacy Assistant [phone]  I have reviewed the care management and care coordination activities outlined in this encounter and I am certifying that I agree with the content of this note. No further action required.  [phone], PharmD Clinical Pharmacist [facility] [phone]

## 2020-10-09 ENCOUNTER — Other Ambulatory Visit: Payer: Medicare Other

## 2020-10-09 ENCOUNTER — Other Ambulatory Visit: Payer: Self-pay

## 2020-10-09 DIAGNOSIS — Z515 Encounter for palliative care: Secondary | ICD-10-CM

## 2020-10-10 NOTE — Progress Notes (Signed)
Asked to see patient to obtain blood work.  Mother present and reports patient is a difficult stick.  Attempted left AC but no access.  Assessed for additional access points but unsuccessful.  Gwinda [place], NP notified of inability to obtain blood work.

## 2020-10-13 ENCOUNTER — Other Ambulatory Visit: Payer: Self-pay | Admitting: Neurology

## 2020-10-16 ENCOUNTER — Encounter: Payer: Self-pay | Admitting: Adult Health

## 2020-10-18 ENCOUNTER — Telehealth: Payer: Self-pay

## 2020-10-18 NOTE — Telephone Encounter (Signed)
10/18/20 @ 1140 AM: Palliative care SW outreached patients sister, Steward Drone, to discuss PACE program, as the PACE coordinator has been attempting to outreach her about enrolling patient into their services. PACE states patient meets criteria.   Call to sister, unsuccessful. SW left VM with PACE Coordinator, Vito Berger [phone], contact info.

## 2020-10-30 ENCOUNTER — Telehealth: Payer: Self-pay

## 2020-10-30 NOTE — Progress Notes (Signed)
    Chronic Care Management Pharmacy Assistant   Name: JEROLD YOSS  MRN: [id number] DOB: November 30, 1955  Reason for Encounter: CCM (Appointment Reminder)   Medications: Outpatient Encounter Medications as of 10/30/2020  Medication Sig   aspirin EC 81 MG tablet Take 1 tablet (81 mg total) by mouth daily. Swallow whole.   atorvastatin (LIPITOR) 80 MG tablet TAKE 1 TABLET BY MOUTH EVERY DAY   Cyanocobalamin (VITAMIN B-12) 1000 MCG SUBL Place 1,000 mcg under the tongue daily.   divalproex (DEPAKOTE) 250 MG DR tablet Take 3 tablets (750 mg total) by mouth every 12 (twelve) hours.   lacosamide (VIMPAT) 200 MG TABS tablet Take 1 tablet (200 mg total) by mouth 2 (two) times daily.   levETIRAcetam (KEPPRA) 750 MG tablet TAKE 2 TABLETS (1,500 MG TOTAL) BY MOUTH 2 (TWO) TIMES DAILY.   Multiple Vitamin (MULTIVITAMIN WITH MINERALS) TABS tablet Take 1 tablet by mouth daily.   sertraline (ZOLOFT) 25 MG tablet TAKE 1 TABLET BY MOUTH EVERY DAY   No facility-administered encounter medications on file as of 10/30/2020.    Voicemail for KEGAN MCKEITHAN was left to remind him of his upcoming telephone visit with [name] on 11/06/2020 at 8:30 AM. Patient was reminded to have all medications, supplements and any blood glucose and blood pressure readings available for review at appointment.  Star Rating Drugs: Medication:  Last Fill: Day Supply Atorvastatin 80mg  08/14/2020 90  10/14/2020, CPP notified  [name], [facility] Clinical Pharmacy Assistant [phone]  Time Spent: 10 Minutes

## 2020-11-06 ENCOUNTER — Ambulatory Visit (INDEPENDENT_AMBULATORY_CARE_PROVIDER_SITE_OTHER): Payer: Medicare Other

## 2020-11-06 ENCOUNTER — Other Ambulatory Visit: Payer: Self-pay

## 2020-11-06 DIAGNOSIS — E782 Mixed hyperlipidemia: Secondary | ICD-10-CM

## 2020-11-06 DIAGNOSIS — I10 Essential (primary) hypertension: Secondary | ICD-10-CM

## 2020-11-06 NOTE — Patient Instructions (Signed)
Dear [name] Cruz,  Below is a summary of the goals we discussed during our follow up appointment on November 06, 2020. Please contact me anytime with questions or concerns.   Visit Information  Patient Care Plan: CCM Pharmacy Care Plan     Problem Identified: CHL AMB "PATIENT-SPECIFIC PROBLEM"      Long-Range Goal: Disease Management   Start Date: 05/04/2020  This Visit's Progress: On track  Priority: High  Note:   Current Barriers:  None identified  Pharmacist Clinical Goal(s):  Patient will contact provider office for questions/concerns as evidenced notation of same in electronic health record through collaboration with PharmD and provider.   Interventions: 1:1 collaboration with [name], MD regarding development and update of comprehensive plan of care as evidenced by provider attestation and co-signature Inter-disciplinary care team collaboration (see longitudinal plan of care) Comprehensive medication review performed; medication list updated in electronic medical record  Hypertension (BP goal <130/80 mmHg) -Controlled - home and clinic BP within goal -Current treatment:  None -Medications previously tried: carvedilol - stopped 01/2020 after patient ran out, formally d/c during hospital 07/21 for stroke  -Current home readings: checks periodically at home 126/75  91 bpm   109/91  99 bpm  -Current dietary habits: not discussed -Current exercise habits: limited mobility -Denies hypotensive/hypertensive symptoms -BP goal 130/80 per neurology -Continue to monitor BP at home bimonthly, document, and provide log at future appointments -Recommended to continue current medication  Hyperlipidemia/History of CVA: (LDL goal < 70) -Not ideally controlled - LDL 78 (06/2019) -Current treatment: Atorvastatin 80 mg - 1 tablet daily Aspirin 81 mg - 1 tablet daily -Medications previously tried: Zetia - unclear reason for d/c 01/2018, patient stopped taking  -Pt was on  atorvastatin 80 mg monotherapy prior to last lipid panel with LDL 78. Confirms adherence. Consider aggressive goal < 70 with CV history. Consider restart Zetia. He has not scheduled cardiology visit due to need for virtual visits.  -Discussed cholesterol goals -Recommended update lipid panel with next labs.   Cognitive Impairment/Mood (Goal: Control symptoms) -Controlled - per daughter report -Patient did not tolerate taper off Geodon due to agitation. He is doing well on current therapy. -Current treatment: Ziprasidone 20 mg (Geodon)- 1 tablet at bedtime Sertraline 25 mg - 1 tablet daily  -Medications previously tried/failed: none reported -Recommended to continue current medication  Seizures (Goal: Prevent seizures) -Controlled - per daughter report -Current treatment  Levetiracetam 750 mg (Keppra) - 2 tablets (1500 mg) twice daily Lacosamide 200 mg (Vimpat) - 1 tablet in the morning (8 AM) and 1 tabs at night (10 PM) Depakote 500 mg - 1 tablet twice daily  -Medications previously tried: none reported -Confirms adherence, denies recent seizures (last 05/2020) -Recommended to continue current medication  OTCs: confirms multivitamin, B12 1000 mcg SL daily  Patient Goals/Self-Care Activities Patient will:  - focus on medication adherence by continuing to use pillbox; Contact pharmacist if any pharmacy delays/concerns with refills.  -Schedule a virtual follow up visit with cardiologist  Follow Up Plan: Telephone follow up appointment with care management team member scheduled for: 12 months -CMA cholesterol review in 6 months        Patient verbalizes understanding of instructions provided today and agrees to view in MyChart.  Telephone follow up appointment with pharmacy team member scheduled for:  12 months  [name], PharmD Clinical Pharmacist [facility] at [facility] [phone]

## 2020-11-06 NOTE — Progress Notes (Signed)
Chronic Care Management Pharmacy Note  11/06/2020 Name:  [name] Cruz MRN:  [id number] DOB:  01/23/1955   Summary: [name] Cruz with patient's sister, [name] Cruz. Reports patient is doing well. Patient is not currently enrolled in palliative care due to cost. Patient was not able to tolerate taper off Geodon/sertraline. He continues all medications as prescribed. Caregiver has no concerns today.   Recommendations: None  Plan:  - CCM follow up 12 months - 6 months CMA cholesterol review   Subjective: [name] Cruz is an 65 y.o. year old male who is a primary patient of Ria Bush, MD.  The CCM team was consulted for assistance with disease management and care coordination needs.   Engaged with patient by telephone for follow up visit in response to provider referral for pharmacy case management and/or care coordination services.   Consent to Services:  The patient was given information about Chronic Care Management services, agreed to services, and gave verbal consent prior to initiation of services.  Please see initial visit note for detailed documentation.   Patient Care Team: Ria Bush, MD as PCP - General (Family Medicine) Jettie Booze, MD as PCP - Cardiology (Cardiology) Debbora Dus, [facility] as Pharmacist (Pharmacist) Garvin Fila, MD as Consulting Physician (Neurology) Frann Rider, NP as Nurse Practitioner (Neurology)  Recent office visits: 08/29/20 - PCP - Telephone call regarding medication. Recommend slowly taper off sertraline and ziprasidone one medication at a time.   Recent consult visits:  09/04/20 - Neurology - Telemedicine follow up seizures. Labs ordered (no results as of 8/25). Referral for palliative care. Follow up 4 months.   Hospital visits: 05/25/2020 - ED visit - Seizures   Objective:  Lab Results  Component Value Date   CREATININE 0.56 (L) 05/31/2020   BUN 9 05/31/2020   GFR 105.15 06/18/2018   GFRNONAA >60 05/31/2020   GFRAA  >60 10/09/2019   NA 137 05/31/2020   K 4.8 05/31/2020   CALCIUM 8.6 (L) 05/31/2020   CO2 27 05/31/2020   GLUCOSE 80 05/31/2020    Lab Results  Component Value Date/Time   HGBA1C 5.6 07/14/2019 12:07 PM   HGBA1C 5.6 07/09/2018 04:16 AM   GFR 105.15 06/18/2018 01:01 PM   GFR 87.39 10/03/2017 10:52 AM   MICROALBUR <0.7 06/18/2018 01:10 PM    Lab Results  Component Value Date   CHOL 143 07/14/2019   HDL 35.90 (L) 07/14/2019   LDLCALC 78 07/14/2019   LDLDIRECT 122.0 07/22/2016   TRIG 148.0 07/14/2019   CHOLHDL 4 07/14/2019    Hepatic Function Latest Ref Rng & Units 05/29/2020 05/26/2020 05/25/2020  Total Protein 6.5 - 8.1 g/dL 5.6(L) 5.8(L) -  Albumin 3.5 - 5.0 g/dL 3.0(L) 3.3(L) 3.4(L)  AST 15 - 41 U/L 18 21 -  ALT 0 - 44 U/L 21 22 -  Alk Phosphatase 38 - 126 U/L 43 45 -  Total Bilirubin 0.3 - 1.2 mg/dL 0.7 0.6 -  Bilirubin, Direct 0.0 - 0.2 mg/dL <0.1 - -    Lab Results  Component Value Date/Time   TSH 1.179 10/28/2019 06:22 PM   TSH 1.24 06/18/2018 01:01 PM   TSH 0.576 03/25/2018 10:21 PM   TSH 0.75 10/17/2014 12:57 PM    CBC Latest Ref Rng & Units 05/30/2020 05/29/2020 05/28/2020  WBC 4.0 - 10.5 K/uL 7.1 6.8 9.7  Hemoglobin 13.0 - 17.0 g/dL 11.0(L) 12.5(L) 12.5(L)  Hematocrit 39.0 - 52.0 % 32.3(L) 36.4(L) 35.8(L)  Platelets 150 - 400 K/uL 153 190 195  Lab Results  Component Value Date/Time   VD25OH 34.54 07/22/2016 09:59 AM   Clinical ASCVD: Yes  The ASCVD Risk score (Arnett DK, et al., 2019) failed to calculate for the following reasons:   The patient has a prior MI or stroke diagnosis    Depression screen [facility] 2/9 06/17/2018  Decreased Interest 0  Down, Depressed, Hopeless 0  PHQ - 2 Score 0  Altered sleeping 0  Tired, decreased energy 0  Change in appetite 0  Feeling bad or failure about yourself  0  Trouble concentrating 0  Moving slowly or fidgety/restless 0  Suicidal thoughts 0  PHQ-9 Score 0  Difficult doing work/chores Not difficult at all  Some  recent data might be hidden     Social History   Tobacco Use  Smoking Status Never  Smokeless Tobacco Former   BP Readings from Last 3 Encounters:  05/31/20 121/70  03/13/20 112/64  10/28/19 (!) 154/74   Pulse Readings from Last 3 Encounters:  05/31/20 82  03/13/20 64  10/28/19 100   Wt Readings from Last 3 Encounters:  10/09/19 172 lb 9.9 oz (78.3 kg)  07/26/19 172 lb 9.9 oz (78.3 kg)  07/19/19 186 lb 4.6 oz (84.5 kg)   BMI Readings from Last 3 Encounters:  03/13/20 30.58 kg/m  10/22/19 30.58 kg/m  10/09/19 30.58 kg/m    Assessment/Interventions: Review of patient past medical history, allergies, medications, health status, including review of consultants reports, laboratory and other test data, was performed as part of comprehensive evaluation and provision of chronic care management services.   SDOH:  (Social Determinants of Health) assessments and interventions performed: No - discussed April 2022   SDOH Screenings   Alcohol Screen: Not on file  Depression (PHQ2-9): Not on file  Financial Resource Strain: Low Risk    Difficulty of Paying Living Expenses: Not very hard  Food Insecurity: Not on file  Housing: Not on file  Physical Activity: Not on file  Social Connections: Not on file  Stress: Not on file  Tobacco Use: Medium Risk   Smoking Tobacco Use: Never   Smokeless Tobacco Use: Former   Passive Exposure: Not on file  Transportation Needs: Not on file    [place]  Allergies  Allergen Reactions   Losartan Other (See Comments)    hyperkalemia    Medications Reviewed Today     Reviewed by Frann Rider, NP (Nurse Practitioner) on 09/04/20 at [place] List Status: <None>   Medication Order Taking? Sig Documenting Provider Last Dose Status Informant  aspirin EC 81 MG tablet [id number] No Take 1 tablet (81 mg total) by mouth daily. Swallow whole. Frann Rider, NP 05/25/2020 Unknown time Active Family Member  atorvastatin (LIPITOR) 80 MG  tablet [id number] No TAKE 1 TABLET BY MOUTH EVERY DAY  Patient taking differently: Take 80 mg by mouth daily.   Ria Bush, MD 05/25/2020 Unknown time Active Family Member  Cyanocobalamin (VITAMIN B-12) 1000 MCG SUBL [id number] No Place 1,000 mcg under the tongue daily. [provider] 05/25/2020 Unknown time Active Family Member  divalproex (DEPAKOTE) 250 MG DR tablet [id number]  Take 3 tablets (750 mg total) by mouth every 12 (twelve) hours. Frann Rider, NP  Active   lacosamide (VIMPAT) 200 MG TABS tablet [id number]  Take 1 tablet (200 mg total) by mouth 2 (two) times daily. Frann Rider, NP  Active   levETIRAcetam (KEPPRA) 750 MG tablet [id number]  TAKE 2 TABLETS (1,500 MG TOTAL) BY MOUTH 2 (TWO) TIMES  DAILY. Frann Rider, NP  Active   Multiple Vitamin (MULTIVITAMIN WITH MINERALS) TABS tablet [id number] No Take 1 tablet by mouth daily. Ria Bush, MD 05/25/2020 Unknown time Active Family Member  sertraline (ZOLOFT) 25 MG tablet [id number]  TAKE 1 TABLET BY MOUTH EVERY DAY Ria Bush, MD  Active             Patient Active Problem List   Diagnosis Date Noted   Cough 03/16/2020   Bowel and bladder incontinence 10/22/2019   SAH (subarachnoid hemorrhage) (HCC)    Neurogenic bladder    Hemiparesis affecting left side as late effect of stroke ([place])    Thalamic hemorrhage ([place]) 07/26/2019   History of DVT in adulthood 02/21/2019   PAD (peripheral artery disease) ([place]) 12/14/2018   Agitation 09/04/2018   Acute CVA (cerebrovascular accident) ([place]) 08/07/2018   Cerebellar stroke, acute ([place]) 07/10/2018   Status epilepticus ([place]) 03/25/2018   Unilateral occipital headache 10/03/2017   Ischemic stroke ([place]) 08/23/2017   Coronary artery disease involving coronary bypass graft of native heart without angina pectoris    Atrial fibrillation ([place])    Dysphagia, post-stroke    Cardiac arrest ([place]) 08/09/2017   S/P CABG x 3 02/24/2017   NSTEMI (non-ST elevated  myocardial infarction) ([place]) 02/21/2017   Encephalomalacia 10/28/2012   Seizure disorder ([place]) 04/26/2012   Alcohol abuse 04/26/2012   History of stroke with current residual effects    Cognitive impairment    HLD (hyperlipidemia)    HTN (hypertension)     Immunization History  Administered Date(s) Administered   Influenza Inj Mdck Quad Pf 10/03/2018   Influenza, High Dose Seasonal PF 12/30/2016   Influenza, Seasonal, Injecte, Preservative Fre 09/02/2012   Influenza,inj,Quad PF,6+ Mos 10/17/2014, 10/20/2015, 10/03/2017   Influenza-Unspecified 10/02/2013   PNEUMOCOCCAL CONJUGATE-20 09/21/2020   Pneumococcal Conjugate-13 10/03/2018   Pneumococcal Polysaccharide-23 04/27/2012   Td 10/28/2012    Conditions to be addressed/monitored:  Hypertension, Hyperlipidemia and Cognitive impairment, seizures  Patient Care Plan: CCM Pharmacy Care Plan     Problem Identified: CHL AMB "PATIENT-SPECIFIC PROBLEM"      Long-Range Goal: Disease Management   Start Date: 05/04/2020  This Visit's Progress: On track  Priority: High  Note:   Current Barriers:  None identified  Pharmacist Clinical Goal(s):  Patient will contact provider office for questions/concerns as evidenced notation of same in electronic health record through collaboration with PharmD and provider.   Interventions: 1:1 collaboration with Ria Bush, MD regarding development and update of comprehensive plan of care as evidenced by provider attestation and co-signature Inter-disciplinary care team collaboration (see longitudinal plan of care) Comprehensive medication review performed; medication list updated in electronic medical record  Hypertension (BP goal <130/80 mmHg) -Controlled - home and clinic BP within goal -Current treatment:  None -Medications previously tried: carvedilol - stopped 01/2020 after patient ran out, formally d/c during hospital 07/21 for stroke  -Current home readings: checks periodically at  home 126/75  91 bpm   109/91  99 bpm  -Current dietary habits: not discussed -Current exercise habits: limited mobility -Denies hypotensive/hypertensive symptoms -BP goal 130/80 per neurology -Continue to monitor BP at home bimonthly, document, and provide log at future appointments -Recommended to continue current medication  Hyperlipidemia/History of CVA: (LDL goal < 70) -Not ideally controlled - LDL 78 (06/2019) -Current treatment: Atorvastatin 80 mg - 1 tablet daily Aspirin 81 mg - 1 tablet daily -Medications previously tried: Zetia - unclear reason for d/c 01/2018, patient stopped taking  -Pt was on atorvastatin  80 mg monotherapy prior to last lipid panel with LDL 78. Confirms adherence. Consider aggressive goal < 70 with CV history. Consider restart Zetia. He has not scheduled cardiology visit due to need for virtual visits.  -Discussed cholesterol goals -Recommended update lipid panel with next labs.   Cognitive Impairment/Mood (Goal: Control symptoms) -Controlled - per daughter report -Patient did not tolerate taper off Geodon due to agitation. He is doing well on current therapy. -Current treatment: Ziprasidone 20 mg (Geodon)- 1 tablet at bedtime Sertraline 25 mg - 1 tablet daily  -Medications previously tried/failed: none reported -Recommended to continue current medication  Seizures (Goal: Prevent seizures) -Controlled - per daughter report -Current treatment  Levetiracetam 750 mg (Keppra) - 2 tablets (1500 mg) twice daily Lacosamide 200 mg (Vimpat) - 1 tablet in the morning (8 AM) and 1 tabs at night (10 PM) Depakote 500 mg - 1 tablet twice daily  -Medications previously tried: none reported -Confirms adherence, denies recent seizures (last 05/2020) -Recommended to continue current medication  OTCs: confirms multivitamin, B12 1000 mcg SL daily  Patient Goals/Self-Care Activities Patient will:  - focus on medication adherence by continuing to use pillbox; Contact  pharmacist if any pharmacy delays/concerns with refills.  -Schedule a virtual follow up visit with cardiologist  Follow Up Plan: Telephone follow up appointment with care management team member scheduled for: 12 months -CMA cholesterol review in 6 months      Medication Assistance: None required.  Patient affirms current coverage meets needs.  Star Rating Drugs: Medication:                Last Fill:         Day Supply Atorvastatin 80 mg   08/14/20            90  Patient's [facility] pharmacy is:  [id number] [address] [place] [place] [address] [address]Phone: [phone]: [phone] [facility] [place]: SOrtencia Kick picks up the medications every Friday and fills weekly pillboxes; Diane (sister) helps administer the medications. He lives in his home with his sister, DShauna Hugh and mom. Diane provides 24 hour care.  Uses pill box? Yes Pt endorses 100% compliance  Care Plan and Follow Up Patient Decision:  Patient agrees to Care Plan and Follow-up.  MDebbora Dus PharmD Clinical Pharmacist [phone]

## 2020-11-13 DIAGNOSIS — I10 Essential (primary) hypertension: Secondary | ICD-10-CM

## 2020-11-13 DIAGNOSIS — E782 Mixed hyperlipidemia: Secondary | ICD-10-CM

## 2020-11-15 ENCOUNTER — Other Ambulatory Visit: Payer: Self-pay

## 2020-11-15 ENCOUNTER — Ambulatory Visit: Payer: Medicare Other

## 2020-11-18 ENCOUNTER — Other Ambulatory Visit: Payer: Self-pay | Admitting: Neurology

## 2020-12-15 ENCOUNTER — Other Ambulatory Visit: Payer: Self-pay | Admitting: Adult Health

## 2020-12-15 DIAGNOSIS — G40909 Epilepsy, unspecified, not intractable, without status epilepticus: Secondary | ICD-10-CM

## 2021-02-05 DIAGNOSIS — S5002XA Contusion of left elbow, initial encounter: Secondary | ICD-10-CM | POA: Diagnosis not present

## 2021-02-05 DIAGNOSIS — R569 Unspecified convulsions: Secondary | ICD-10-CM | POA: Diagnosis not present

## 2021-02-05 DIAGNOSIS — G8194 Hemiplegia, unspecified affecting left nondominant side: Secondary | ICD-10-CM | POA: Diagnosis not present

## 2021-02-05 DIAGNOSIS — G9340 Encephalopathy, unspecified: Secondary | ICD-10-CM | POA: Diagnosis not present

## 2021-02-05 DIAGNOSIS — R053 Chronic cough: Secondary | ICD-10-CM | POA: Diagnosis not present

## 2021-02-20 ENCOUNTER — Other Ambulatory Visit: Payer: Self-pay | Admitting: Family Medicine

## 2021-02-23 NOTE — Telephone Encounter (Signed)
Geodon Last filled:  11/27/20, #90 Last OV:  03/13/20, f/u Next OV:  none

## 2021-02-24 NOTE — Telephone Encounter (Signed)
ERx. plz verify if pt is still taking this medication

## 2021-02-27 NOTE — Telephone Encounter (Signed)
Spoke with pt's sister/POA, [name], asking about med.  She confirms pt still take Geodon.  Fyi to Dr. Darnell Level.

## 2021-03-08 ENCOUNTER — Inpatient Hospital Stay (HOSPITAL_COMMUNITY)
Admission: EM | Admit: 2021-03-08 | Discharge: 2021-03-10 | DRG: 100 | Disposition: A | Discharge status: Hospice | Payer: Medicare Other | Attending: Internal Medicine | Admitting: Internal Medicine

## 2021-03-08 ENCOUNTER — Other Ambulatory Visit: Payer: Self-pay

## 2021-03-08 ENCOUNTER — Emergency Department (HOSPITAL_COMMUNITY): Payer: Medicare Other

## 2021-03-08 DIAGNOSIS — I639 Cerebral infarction, unspecified: Secondary | ICD-10-CM | POA: Diagnosis not present

## 2021-03-08 DIAGNOSIS — G40909 Epilepsy, unspecified, not intractable, without status epilepticus: Secondary | ICD-10-CM | POA: Diagnosis present

## 2021-03-08 DIAGNOSIS — Z66 Do not resuscitate: Secondary | ICD-10-CM | POA: Diagnosis not present

## 2021-03-08 DIAGNOSIS — Z515 Encounter for palliative care: Secondary | ICD-10-CM

## 2021-03-08 DIAGNOSIS — E871 Hypo-osmolality and hyponatremia: Secondary | ICD-10-CM | POA: Diagnosis present

## 2021-03-08 DIAGNOSIS — G8190 Hemiplegia, unspecified affecting unspecified side: Secondary | ICD-10-CM | POA: Diagnosis not present

## 2021-03-08 DIAGNOSIS — E876 Hypokalemia: Secondary | ICD-10-CM | POA: Diagnosis present

## 2021-03-08 DIAGNOSIS — J3489 Other specified disorders of nose and nasal sinuses: Secondary | ICD-10-CM | POA: Diagnosis not present

## 2021-03-08 DIAGNOSIS — Z888 Allergy status to other drugs, medicaments and biological substances status: Secondary | ICD-10-CM

## 2021-03-08 DIAGNOSIS — R4189 Other symptoms and signs involving cognitive functions and awareness: Secondary | ICD-10-CM | POA: Diagnosis present

## 2021-03-08 DIAGNOSIS — Z7401 Bed confinement status: Secondary | ICD-10-CM

## 2021-03-08 DIAGNOSIS — R1310 Dysphagia, unspecified: Secondary | ICD-10-CM | POA: Diagnosis present

## 2021-03-08 DIAGNOSIS — Z7982 Long term (current) use of aspirin: Secondary | ICD-10-CM

## 2021-03-08 DIAGNOSIS — Z86718 Personal history of other venous thrombosis and embolism: Secondary | ICD-10-CM

## 2021-03-08 DIAGNOSIS — G9341 Metabolic encephalopathy: Secondary | ICD-10-CM | POA: Diagnosis present

## 2021-03-08 DIAGNOSIS — E785 Hyperlipidemia, unspecified: Secondary | ICD-10-CM | POA: Diagnosis not present

## 2021-03-08 DIAGNOSIS — E860 Dehydration: Secondary | ICD-10-CM | POA: Diagnosis present

## 2021-03-08 DIAGNOSIS — Z807 Family history of other malignant neoplasms of lymphoid, hematopoietic and related tissues: Secondary | ICD-10-CM | POA: Diagnosis not present

## 2021-03-08 DIAGNOSIS — I69354 Hemiplegia and hemiparesis following cerebral infarction affecting left non-dominant side: Secondary | ICD-10-CM | POA: Diagnosis not present

## 2021-03-08 DIAGNOSIS — Z951 Presence of aortocoronary bypass graft: Secondary | ICD-10-CM | POA: Diagnosis not present

## 2021-03-08 DIAGNOSIS — I252 Old myocardial infarction: Secondary | ICD-10-CM | POA: Diagnosis not present

## 2021-03-08 DIAGNOSIS — Z79899 Other long term (current) drug therapy: Secondary | ICD-10-CM

## 2021-03-08 DIAGNOSIS — R569 Unspecified convulsions: Secondary | ICD-10-CM | POA: Diagnosis not present

## 2021-03-08 DIAGNOSIS — I2510 Atherosclerotic heart disease of native coronary artery without angina pectoris: Secondary | ICD-10-CM | POA: Diagnosis present

## 2021-03-08 DIAGNOSIS — Z820 Family history of epilepsy and other diseases of the nervous system: Secondary | ICD-10-CM

## 2021-03-08 DIAGNOSIS — R052 Subacute cough: Secondary | ICD-10-CM | POA: Diagnosis not present

## 2021-03-08 DIAGNOSIS — Z20822 Contact with and (suspected) exposure to covid-19: Secondary | ICD-10-CM | POA: Diagnosis present

## 2021-03-08 DIAGNOSIS — Z743 Need for continuous supervision: Secondary | ICD-10-CM | POA: Diagnosis not present

## 2021-03-08 DIAGNOSIS — Z7189 Other specified counseling: Secondary | ICD-10-CM | POA: Diagnosis not present

## 2021-03-08 DIAGNOSIS — R404 Transient alteration of awareness: Secondary | ICD-10-CM | POA: Diagnosis not present

## 2021-03-08 DIAGNOSIS — Z789 Other specified health status: Secondary | ICD-10-CM | POA: Diagnosis not present

## 2021-03-08 DIAGNOSIS — I69398 Other sequelae of cerebral infarction: Secondary | ICD-10-CM

## 2021-03-08 DIAGNOSIS — I10 Essential (primary) hypertension: Secondary | ICD-10-CM | POA: Diagnosis not present

## 2021-03-08 DIAGNOSIS — R9431 Abnormal electrocardiogram [ECG] [EKG]: Secondary | ICD-10-CM | POA: Diagnosis not present

## 2021-03-08 DIAGNOSIS — R059 Cough, unspecified: Secondary | ICD-10-CM

## 2021-03-08 DIAGNOSIS — I69392 Facial weakness following cerebral infarction: Secondary | ICD-10-CM

## 2021-03-08 DIAGNOSIS — R4020 Unspecified coma: Secondary | ICD-10-CM | POA: Diagnosis not present

## 2021-03-08 DIAGNOSIS — G9389 Other specified disorders of brain: Secondary | ICD-10-CM | POA: Diagnosis not present

## 2021-03-08 DIAGNOSIS — I69320 Aphasia following cerebral infarction: Secondary | ICD-10-CM | POA: Diagnosis not present

## 2021-03-08 DIAGNOSIS — Z711 Person with feared health complaint in whom no diagnosis is made: Secondary | ICD-10-CM | POA: Diagnosis not present

## 2021-03-08 DIAGNOSIS — R4182 Altered mental status, unspecified: Secondary | ICD-10-CM

## 2021-03-08 DIAGNOSIS — I6523 Occlusion and stenosis of bilateral carotid arteries: Secondary | ICD-10-CM | POA: Diagnosis not present

## 2021-03-08 DIAGNOSIS — R638 Other symptoms and signs concerning food and fluid intake: Secondary | ICD-10-CM | POA: Diagnosis not present

## 2021-03-08 LAB — COMPREHENSIVE METABOLIC PANEL
ALT: 21 U/L (ref 0–44)
AST: 25 U/L (ref 15–41)
Albumin: 2.8 g/dL — ABNORMAL LOW (ref 3.5–5.0)
Alkaline Phosphatase: 51 U/L (ref 38–126)
Anion gap: 9 (ref 5–15)
BUN: 8 mg/dL (ref 8–23)
CO2: 25 mmol/L (ref 22–32)
Calcium: 8.6 mg/dL — ABNORMAL LOW (ref 8.9–10.3)
Chloride: 93 mmol/L — ABNORMAL LOW (ref 98–111)
Creatinine, Ser: 0.44 mg/dL — ABNORMAL LOW (ref 0.61–1.24)
GFR, Estimated: >60 mL/min (ref >60)
Glucose, Bld: 111 mg/dL — ABNORMAL HIGH (ref 70–99)
Potassium: 3.8 mmol/L (ref 3.5–5.1)
Sodium: 127 mmol/L — ABNORMAL LOW (ref 135–145)
Total Bilirubin: 0.5 mg/dL (ref 0.3–1.2)
Total Protein: 5.4 g/dL — ABNORMAL LOW (ref 6.5–8.1)

## 2021-03-08 LAB — DIFFERENTIAL
Abs Immature Granulocytes: 0.12 10*3/uL — ABNORMAL HIGH (ref 0.00–0.07)
Basophils Absolute: 0 10*3/uL (ref 0.0–0.1)
Basophils Relative: 0 %
Eosinophils Absolute: 0 10*3/uL (ref 0.0–0.5)
Eosinophils Relative: 0 %
Immature Granulocytes: 1 %
Lymphocytes Relative: 25 %
Lymphs Abs: 2.9 10*3/uL (ref 0.7–4.0)
Monocytes Absolute: 1.7 10*3/uL — ABNORMAL HIGH (ref 0.1–1.0)
Monocytes Relative: 14 %
Neutro Abs: 7.1 10*3/uL (ref 1.7–7.7)
Neutrophils Relative %: 60 %

## 2021-03-08 LAB — URINALYSIS, ROUTINE W REFLEX MICROSCOPIC
Bacteria, UA: NONE SEEN
Bilirubin Urine: NEGATIVE
Glucose, UA: NEGATIVE mg/dL
Hgb urine dipstick: NEGATIVE
Ketones, ur: 5 mg/dL — AB
Leukocytes,Ua: NEGATIVE
Nitrite: NEGATIVE
Protein, ur: NEGATIVE mg/dL
Specific Gravity, Urine: 1.006 (ref 1.005–1.030)
pH: 5 (ref 5.0–8.0)

## 2021-03-08 LAB — CBC
HCT: 40.8 % (ref 39.0–52.0)
Hemoglobin: 14 g/dL (ref 13.0–17.0)
MCH: 32.5 pg (ref 26.0–34.0)
MCHC: 34.3 g/dL (ref 30.0–36.0)
MCV: 94.7 fL (ref 80.0–100.0)
Platelets: 246 10*3/uL (ref 150–400)
RBC: 4.31 MIL/uL (ref 4.22–5.81)
RDW: 13.2 % (ref 11.5–15.5)
WBC: 11.9 10*3/uL — ABNORMAL HIGH (ref 4.0–10.5)
nRBC: 0 % (ref 0.0–0.2)

## 2021-03-08 LAB — RAPID URINE DRUG SCREEN, HOSP PERFORMED
Amphetamines: NOT DETECTED
Barbiturates: NOT DETECTED
Benzodiazepines: NOT DETECTED
Cocaine: NOT DETECTED
Opiates: NOT DETECTED
Tetrahydrocannabinol: NOT DETECTED

## 2021-03-08 LAB — I-STAT CHEM 8, ED
BUN: 9 mg/dL (ref 8–23)
Calcium, Ion: 1.11 mmol/L — ABNORMAL LOW (ref 1.15–1.40)
Chloride: 92 mmol/L — ABNORMAL LOW (ref 98–111)
Creatinine, Ser: 0.3 mg/dL — ABNORMAL LOW (ref 0.61–1.24)
Glucose, Bld: 110 mg/dL — ABNORMAL HIGH (ref 70–99)
HCT: 42 % (ref 39.0–52.0)
Hemoglobin: 14.3 g/dL (ref 13.0–17.0)
Potassium: 4 mmol/L (ref 3.5–5.1)
Sodium: 128 mmol/L — ABNORMAL LOW (ref 135–145)
TCO2: 28 mmol/L (ref 22–32)

## 2021-03-08 LAB — BLOOD GAS, VENOUS
Acid-Base Excess: 1.7 mmol/L (ref 0.0–2.0)
Bicarbonate: 27.2 mmol/L (ref 20.0–28.0)
Drawn by: 6775
FIO2: 21 %
O2 Saturation: 71.8 %
Patient temperature: 37
pCO2, Ven: 45 mmHg (ref 44–60)
pH, Ven: 7.39 (ref 7.25–7.43)
pO2, Ven: 46 mmHg — ABNORMAL HIGH (ref 32–45)

## 2021-03-08 LAB — TSH: TSH: 1.71 u[IU]/mL (ref 0.350–4.500)

## 2021-03-08 LAB — RESP PANEL BY RT-PCR (FLU A&B, COVID) ARPGX2
Influenza A by PCR: NEGATIVE
Influenza B by PCR: NEGATIVE
SARS Coronavirus 2 by RT PCR: NEGATIVE

## 2021-03-08 LAB — PROTIME-INR
INR: 1 (ref 0.8–1.2)
Prothrombin Time: 13.5 seconds (ref 11.4–15.2)

## 2021-03-08 LAB — HIV ANTIBODY (ROUTINE TESTING W REFLEX): HIV Screen 4th Generation wRfx: NONREACTIVE

## 2021-03-08 LAB — VALPROIC ACID LEVEL: Valproic Acid Lvl: 53 ug/mL (ref 50.0–100.0)

## 2021-03-08 LAB — CBG MONITORING, ED: Glucose-Capillary: 115 mg/dL — ABNORMAL HIGH (ref 70–99)

## 2021-03-08 LAB — VITAMIN B12: Vitamin B-12: 3193 pg/mL — ABNORMAL HIGH (ref 180–914)

## 2021-03-08 LAB — ETHANOL: Alcohol, Ethyl (B): <10 mg/dL (ref <10)

## 2021-03-08 LAB — APTT: aPTT: 28 seconds (ref 24–36)

## 2021-03-08 LAB — OSMOLALITY: Osmolality: 269 mOsm/kg — ABNORMAL LOW (ref 275–295)

## 2021-03-08 LAB — MAGNESIUM: Magnesium: 1.7 mg/dL (ref 1.7–2.4)

## 2021-03-08 LAB — AMMONIA: Ammonia: 42 umol/L — ABNORMAL HIGH (ref 9–35)

## 2021-03-08 MED ORDER — VITAMIN B-12 100 MCG PO TABS
1000.0000 ug | ORAL_TABLET | Freq: Every day | ORAL | Status: DC
  Administered 2021-03-09 – 2021-03-10 (×2): 1000 ug via ORAL
  Filled 2021-03-08 (×4): qty 10

## 2021-03-08 MED ORDER — LEVETIRACETAM 750 MG PO TABS
1500.0000 mg | ORAL_TABLET | Freq: Two times a day (BID) | ORAL | Status: DC
  Administered 2021-03-09 – 2021-03-10 (×3): 1500 mg via ORAL
  Filled 2021-03-08 (×4): qty 2

## 2021-03-08 MED ORDER — SODIUM CHLORIDE 0.9 % IV SOLN: 75.0000 mL/h | INTRAVENOUS | Status: DC

## 2021-03-08 MED ORDER — LACOSAMIDE 200 MG PO TABS
200.0000 mg | ORAL_TABLET | Freq: Two times a day (BID) | ORAL | Status: DC
  Administered 2021-03-09 – 2021-03-10 (×3): 200 mg via ORAL
  Filled 2021-03-08 (×4): qty 1

## 2021-03-08 MED ORDER — ATORVASTATIN CALCIUM 80 MG PO TABS
80.0000 mg | ORAL_TABLET | Freq: Every day | ORAL | Status: DC
  Administered 2021-03-09 – 2021-03-10 (×2): 80 mg via ORAL
  Filled 2021-03-08 (×2): qty 1

## 2021-03-08 MED ORDER — MELATONIN 5 MG PO TABS
20.0000 mg | ORAL_TABLET | Freq: Every evening | ORAL | Status: DC | PRN
  Filled 2021-03-08: qty 4

## 2021-03-08 MED ORDER — SODIUM CHLORIDE 0.9 % IV SOLN: INTRAVENOUS | Status: DC

## 2021-03-08 MED ORDER — DIVALPROEX SODIUM 250 MG PO DR TAB
750.0000 mg | DELAYED_RELEASE_TABLET | Freq: Two times a day (BID) | ORAL | Status: DC
Start: 2021-03-08 — End: 2021-03-11
  Administered 2021-03-09 – 2021-03-10 (×3): 750 mg via ORAL
  Filled 2021-03-08 (×4): qty 3

## 2021-03-08 MED ORDER — ORAL CARE MOUTH RINSE
15.0000 mL | OROMUCOSAL | Status: DC
  Administered 2021-03-08 – 2021-03-10 (×17): 15 mL via OROMUCOSAL

## 2021-03-08 MED ORDER — CHLORHEXIDINE GLUCONATE 0.12% ORAL RINSE (MEDLINE KIT)
15.0000 mL | Freq: Two times a day (BID) | OROMUCOSAL | Status: DC
  Administered 2021-03-08 – 2021-03-10 (×4): 15 mL via OROMUCOSAL

## 2021-03-08 MED ORDER — ADULT MULTIVITAMIN W/MINERALS CH
1.0000 | ORAL_TABLET | Freq: Every day | ORAL | Status: DC
  Administered 2021-03-09 – 2021-03-10 (×2): 1 via ORAL
  Filled 2021-03-08 (×2): qty 1

## 2021-03-08 MED ORDER — ZIPRASIDONE HCL 20 MG PO CAPS
20.0000 mg | ORAL_CAPSULE | Freq: Every day | ORAL | Status: DC
  Administered 2021-03-09: 20 mg via ORAL
  Filled 2021-03-08 (×3): qty 1

## 2021-03-08 MED ORDER — SERTRALINE HCL 50 MG PO TABS
25.0000 mg | ORAL_TABLET | Freq: Every day | ORAL | Status: DC
  Administered 2021-03-09 – 2021-03-10 (×2): 25 mg via ORAL
  Filled 2021-03-08 (×2): qty 1

## 2021-03-08 MED ORDER — ENOXAPARIN SODIUM 40 MG/0.4ML IJ SOSY
40.0000 mg | PREFILLED_SYRINGE | INTRAMUSCULAR | Status: DC
  Administered 2021-03-08 – 2021-03-09 (×2): 40 mg via SUBCUTANEOUS
  Filled 2021-03-08 (×2): qty 0.4

## 2021-03-08 MED ORDER — SODIUM CHLORIDE 0.9 % IV BOLUS
1000.0000 mL | Freq: Once | INTRAVENOUS | Status: AC
  Administered 2021-03-08: 1000 mL via INTRAVENOUS

## 2021-03-08 MED ORDER — ASPIRIN EC 81 MG PO TBEC
81.0000 mg | DELAYED_RELEASE_TABLET | Freq: Every day | ORAL | Status: DC
  Administered 2021-03-09 – 2021-03-10 (×2): 81 mg via ORAL
  Filled 2021-03-08 (×2): qty 1

## 2021-03-08 NOTE — ED Notes (Signed)
RN went to give pt fluids and EEG requested for RN to come back.

## 2021-03-08 NOTE — Consult Note (Addendum)
Neurology Consultation Reason for Consult: Code stroke Requesting Physician: Blanchie Dessert   CC: Episode of unresponsiveness  History is obtained from: Chart review and sister [name] Cruz   HPI: [name] Cruz is a 66 y.o. male with a past medical history significant for post stroke epilepsy, multiple prior strokes with residual left hemiparesis, aphasia, cognitive impairment (left MCA with hemorrhagic conversion 2004, right cerebellar June 2020, right PCA July 2020, right basal ganglia July 2021 with hemorrhagic conversion), hypertension, hyperlipidemia, coronary artery disease status post triple bypass.  Sister reports he has had a gradual decline recently.  At his recent baseline he is totally dependent for all care including feeding, and is cared for very lovingly by his family members.  He is still able to identify his sister and say some short sentences but for example will ask to be put back in bed to lay down when he is already in bed.  His seizures are fairly subtle, she reports that he continues to have staring episodes that last a few seconds at a time.  On his last code stroke evaluation he was found to have left gaze deviation and right frontotemporal seizure.  At that time Depakote was increased.  His seizures seem to have been better controlled more recently per family, however he has been having increased coughing and likely dysphagia recently.  This morning he had a coughing spell and subsequently was unresponsive around noon.  His sister is planning to meet with the hospice team on Monday with plan to transition to hospice; he would not want aggressive measures such as intubation or tube feeding per his sister [name] Cruz.   LKW: Noon tPA given?: No, rapidly returning to baseline and symptoms most consistent with seizure Premorbid modified rankin scale:      5 - Severe disability. Requires constant nursing care and attention, bedridden, incontinent.  ROS: Unable to obtain due to  altered mental status.  Family denies any missed medications, any somatic complaints other than cough and some minor skin breakdown in his legs.  They do note he has had very reduced appetite lately  Past Medical History:  Diagnosis Date   Acute deep vein thrombosis (DVT) of left upper extremity (HCC)    L brachial and basilic veins, eliquis started 08/22/2017 to continue for 3 months total    Cognitive impairment 2007   after stroke, saw rehab but told to stop because was too upsetting to him   History of chicken pox    HLD (hyperlipidemia)    HTN (hypertension)    NSTEMI (non-ST elevated myocardial infarction) ([place]) 02/21/2017   Obesity    Stroke, hemorrhagic ([place]) 2007   thought 2/2 HTN (240sbp); residual cognitive impairment, loss of R peripheral field, no driving   Past Surgical History:  Procedure Laterality Date   ANKLE SURGERY  1990s   right foot with plate and screws   CORONARY ARTERY BYPASS GRAFT N/A 02/24/2017   3v Procedure: CORONARY ARTERY BYPASS GRAFTING (CABG) x 3 ON PUMP USING LEFT INTERNAL MAMMARY ARTERY TO LEFT ANTERIOR DESENDING CORNARY ARTERY, RIGHT GREATER SAPHENOUS VEIN TO LEFT CIRCUMFLEX ARTERY AND POSTERIOR DESENDING ARTERY. RIGHT GREATER SAPHENOUS VEIN OBTAINED VIA ENDOVEIN HARVEST.;  Surgeon: Grace Isaac, MD   IABP INSERTION N/A 02/21/2017   Procedure: IABP Insertion;  Surgeon: Nelva Bush, MD;  Location: [facility];  Service: Cardiovascular;  Laterality: N/A;   LEFT HEART CATH AND CORONARY ANGIOGRAPHY N/A 02/21/2017   Procedure: LEFT HEART CATH AND CORONARY ANGIOGRAPHY;  Surgeon: End,  Cristal Deer, MD;  Location: [facility];  Service: Cardiovascular;  Laterality: N/A;   TEE WITHOUT CARDIOVERSION N/A 02/24/2017   Procedure: TRANSESOPHAGEAL ECHOCARDIOGRAM (TEE);  Surgeon: Delight Ovens, MD;  Location[facility];  Service: Open Heart Surgery;  Laterality: N/A;   No current facility-administered medications for this encounter.  Current  Outpatient Medications:    aspirin EC 81 MG tablet, Take 1 tablet (81 mg total) by mouth daily. Swallow whole., Disp: 30 tablet, Rfl: 11   atorvastatin (LIPITOR) 80 MG tablet, TAKE 1 TABLET BY MOUTH EVERY DAY, Disp: 90 tablet, Rfl: 3   Cyanocobalamin (VITAMIN B-12) 1000 MCG SUBL, Place 1,000 mcg under the tongue daily., Disp: , Rfl:    divalproex (DEPAKOTE) 250 MG DR tablet, Take 3 tablets (750 mg total) by mouth every 12 (twelve) hours., Disp: 180 tablet, Rfl: 5   divalproex (DEPAKOTE) 250 MG DR tablet, Take 3 tablets (750 mg total) by mouth 2 (two) times daily., Disp: 540 tablet, Rfl: 1   lacosamide (VIMPAT) 200 MG TABS tablet, Take 1 tablet (200 mg total) by mouth 2 (two) times daily., Disp: 60 tablet, Rfl: 5   levETIRAcetam (KEPPRA) 750 MG tablet, TAKE 2 TABLETS (1,500 MG TOTAL) BY MOUTH 2 (TWO) TIMES DAILY., Disp: 360 tablet, Rfl: 0   Multiple Vitamin (MULTIVITAMIN WITH MINERALS) TABS tablet, Take 1 tablet by mouth daily., Disp: 90 tablet, Rfl: 0   sertraline (ZOLOFT) 25 MG tablet, TAKE 1 TABLET BY MOUTH EVERY DAY, Disp: 90 tablet, Rfl: 3   ziprasidone (GEODON) 20 MG capsule, TAKE 1 CAPSULE BY MOUTH EVERYDAY AT BEDTIME, Disp: 90 capsule, Rfl: 0   Family History  Problem Relation Age of Onset   Alzheimer's disease Maternal Grandfather    Cancer Mother        lymphoma   Alcohol abuse Father        smoker   Coronary artery disease Neg Hx    Stroke Neg Hx    Diabetes Neg Hx     Social History:  reports that he has never smoked. He has quit using smokeless tobacco. He reports that he does not currently use alcohol. He reports that he does not use drugs.  Exam: Current vital signs: BP 132/88    Pulse 91    Temp 98.1 F (36.7 C)    Resp 17    Wt 70 kg    SpO2 94%    BMI 27.34 kg/m  Vital signs in last 24 hours: Temp:  [98.1 F (36.7 C)] 98.1 F (36.7 C) (02/23 1333) Pulse Rate:  [91] 91 (02/23 1400) Resp:  [17] 17 (02/23 1400) BP: (132)/(88) 132/88 (02/23 1400) SpO2:  [94 %] 94 %  (02/23 1400) Weight:  [70 kg] 70 kg (02/23 1333)   Physical Exam  Constitutional: Appears chronically ill Psych: Minimally interactive, later in evaluation as he is improving he is responsive to his sister and smile slightly at her Eyes: No scleral injection HENT: Does not open mouth to command, opens very slightly at sisters encouragement MSK: no joint deformities.  Cardiovascular: Normal rate and regular rhythm.  No atrial fibrillation at this time Respiratory: Effort normal, non-labored breathing.  Intermittent slight cough GI: Soft.  No distension. There is no tenderness.  Skin: Small skin breakdown dressings on bilateral lower extremities  Neuro: Mental Status: Minimally interactive.  Intermittently has a Cranial Nerves: II: Visual Fields are full to orienting to stimuli in all quadrants. Pupils are round, and reactive to light.   III,IV, VI:  EOMI with a left gaze preference intermittently V: Facial sensation is symmetric to eyelash brush VII: Facial movement is notable for a left facial droop VIII: hearing is intact to voice remainder cannot evaluate secondary to mental status Motor: Tone is spastic on the left side. Bulk is diffusely reduced, more on the left than the right.  Some tremulous movement of the right upper extremity antigravity.  Slight movement of the right lower extremity to noxious stimulation.  No movement of the left side on my evaluation Sensory: Sensation appears reduced on the left Deep Tendon Reflexes: 3+ throughout and relatively increased on the left compared to right  Plantars: Toes are upgoing bilaterally Cerebellar: Unable to assess given mental status Gait:  Nonambulatory at baseline  NIHSS total 27 Score breakdown: One-point for drowsiness, 2 points for not answering questions, 2 points for not following commands, one-point for left gaze preference, 2 points for left facial droop, 4 points for left arm weakness, 4 points for left leg weakness,  2 points for right arm weakness, 3 points for right leg weakness, 2 points for severe loss of sensation on the left, 2 points for severe aphasia, 2 points for unintelligible speech  Performed immediately on patient arrival to ED, note and full neurological examination as documented above completed later    I have reviewed labs in epic and the results pertinent to this consultation are: Initial glucose 115, i-STAT sodium 128, creatinine 0.3, ionized calcium 1.11  I have reviewed the images obtained: Head CT personally reviewed, agree with radiology no evidence of acute intracranial process though there is significant evidence of prior strokes and atrophy "1. No evidence of acute intracranial hemorrhage or infarct. 2. ASPECTS is 10 3. Unchanged encephalomalacia and dystrophic calcification in the left temporoparietal region."    Impression: Most likely etiology is breakthrough seizure given patient's prior history of seizure.  Etiology may be hyponatremia in the setting of poor oral intake.  However will additionally check antiseizure medication levels to confirm adherence and absorption.  We will hold off on adjusting antiseizure medications pending levels.  Recommendations: -Depakote, lacosamide and Keppra levels -Magnesium level, replete as needed; given this resulted borderline low at 1.7, please do continue to monitor and replete if needed -Stat EEG to rule out subclinical status -Work-up and treatment of hyponatremia per ED/primary team -Chest x-ray, further work-up of cough per ED/primary team -Continue to monitor fever curve -Continue goals of care discussion; of note sister [name] Cruz reports he would not want to be intubated even temporarily for seizure control -Neurology will follow up EEG and medication levels, but if patient continues to remain at his recent baseline and goals of care are focused on comfort we will be available on an as-needed basis.  Please do reach out if patient  has neurological deterioration.  Lesleigh Noe MD-PhD [facility] [phone] Available 7 AM to 7 PM, outside these hours please contact Neurologist on call listed on AMION   Addendum, Depakote level has resulted as 53 (which is surprising as it was 11 in May, at which time Depakote was increased from 500 twice daily to 500 3 times daily).  [name] Cruz confirms that he is indeed taking the 750 mg twice daily in addition to his prescribed doses of lacosamide and Keppra.  If antiseizure regimen escalation is needed would consider increasing Depakote dose further, however will hold off at this time given that I think his electrolyte derangements are driving his breakthrough seizure activity

## 2021-03-08 NOTE — ED Notes (Signed)
MD at bedside when pt failed swallow screen

## 2021-03-08 NOTE — ED Provider Notes (Signed)
[name] Provider Note   CSN: [id number] Arrival date & time: 03/08/21  1329  An emergency department physician performed an initial assessment on this suspected stroke patient at 8.  History  Chief Complaint  Patient presents with   Code Stroke    [name] is a 66 y.o. male.  Patient is a 66 year old male with a history of hemorrhagic stroke and chronic left-sided deficits, NSTEMI, prior DVT in the upper extremity not on any anticoagulation at this time, who is presenting today with EMS as a code stroke.  Patient's baseline is flaccid on the left side but has normal strength on the right.  They report today that patient was last seen normal at noon which is 1 hour and 20 minutes prior to arrival.  They noted right-sided facial droop, right-sided weakness and decreased level of consciousness.  No further history is available at this time.  The history is provided by the patient and the nursing home.      Home Medications Prior to Admission medications   Medication Sig Start Date End Date Taking? Authorizing Provider  aspirin EC 81 MG tablet Take 1 tablet (81 mg total) by mouth daily. Swallow whole. 03/06/20   Ihor Austin, NP  atorvastatin (LIPITOR) 80 MG tablet TAKE 1 TABLET BY MOUTH EVERY DAY 05/17/20   [name], MD  Cyanocobalamin (VITAMIN B-12) 1000 MCG SUBL Place 1,000 mcg under the tongue daily.    [provider]  divalproex (DEPAKOTE) 250 MG DR tablet Take 3 tablets (750 mg total) by mouth every 12 (twelve) hours. 08/28/20 02/24/21  Ihor Austin, NP  divalproex (DEPAKOTE) 250 MG DR tablet Take 3 tablets (750 mg total) by mouth 2 (two) times daily. 11/21/20   Ihor Austin, NP  lacosamide (VIMPAT) 200 MG TABS tablet Take 1 tablet (200 mg total) by mouth 2 (two) times daily. 09/04/20   Ihor Austin, NP  levETIRAcetam (KEPPRA) 750 MG tablet TAKE 2 TABLETS (1,500 MG TOTAL) BY MOUTH 2 (TWO) TIMES DAILY. 12/18/20    Ihor Austin, NP  Multiple Vitamin (MULTIVITAMIN WITH MINERALS) TABS tablet Take 1 tablet by mouth daily. 12/26/17   [name], MD  sertraline (ZOLOFT) 25 MG tablet TAKE 1 TABLET BY MOUTH EVERY DAY 08/25/20   [name], MD  ziprasidone (GEODON) 20 MG capsule TAKE 1 CAPSULE BY MOUTH EVERYDAY AT BEDTIME 02/24/21   [name], MD      Allergies    Losartan    Review of Systems   Review of Systems  Physical Exam Updated Vital Signs BP 132/88    Pulse 91    Temp 98.1 F (36.7 C)    Resp 17    Wt 70 kg    SpO2 94%    BMI 27.34 kg/m  Physical Exam Vitals and nursing note reviewed.  Constitutional:      General: He is not in acute distress.    Appearance: He is well-developed.  HENT:     Head: Normocephalic and atraumatic.  Eyes:     Conjunctiva/sclera: Conjunctivae normal.     Pupils: Pupils are equal, round, and reactive to light.  Cardiovascular:     Rate and Rhythm: Normal rate and regular rhythm.     Pulses: Normal pulses.     Heart sounds: No murmur heard. Pulmonary:     Effort: Pulmonary effort is normal. No respiratory distress.     Breath sounds: Normal breath sounds. No wheezing or rales.  Abdominal:  General: There is no distension.     Palpations: Abdomen is soft.     Tenderness: There is no abdominal tenderness. There is no guarding or rebound.  Musculoskeletal:        General: No tenderness. Normal range of motion.     Cervical back: Normal range of motion and neck supple.  Skin:    General: Skin is warm and dry.     Findings: No erythema or rash.  Neurological:     Mental Status: He is alert.     Comments: Patient is oriented to person.  He can follow some commands however has to be prompted frequently.  Patient will look to the left but is sluggish and looking to the right.  Only minimal right-sided facial droop noted at this time with chronic left-sided facial droop.  Left upper and lower extremities with 0 out of 5 strength.  Feet  appear to be contracted bilaterally.  Speech is slightly slurred  Psychiatric:        Behavior: Behavior normal.    ED Results / Procedures / Treatments   Labs (all labs ordered are listed, but only abnormal results are displayed) Labs Reviewed  CBC - Abnormal; Notable for the following components:      Result Value   WBC 11.9 (*)    All other components within normal limits  DIFFERENTIAL - Abnormal; Notable for the following components:   Monocytes Absolute 1.7 (*)    Abs Immature Granulocytes 0.12 (*)    All other components within normal limits  COMPREHENSIVE METABOLIC PANEL - Abnormal; Notable for the following components:   Sodium 127 (*)    Chloride 93 (*)    Glucose, Bld 111 (*)    Creatinine, Ser 0.44 (*)    Calcium 8.6 (*)    Total Protein 5.4 (*)    Albumin 2.8 (*)    All other components within normal limits  CBG MONITORING, ED - Abnormal; Notable for the following components:   Glucose-Capillary 115 (*)    All other components within normal limits  I-STAT CHEM 8, ED - Abnormal; Notable for the following components:   Sodium 128 (*)    Chloride 92 (*)    Creatinine, Ser 0.30 (*)    Glucose, Bld 110 (*)    Calcium, Ion 1.11 (*)    All other components within normal limits  RESP PANEL BY RT-PCR (FLU A&B, COVID) ARPGX2  ETHANOL  PROTIME-INR  APTT  RAPID URINE DRUG SCREEN, HOSP PERFORMED  URINALYSIS, ROUTINE W REFLEX MICROSCOPIC  VALPROIC ACID LEVEL  LEVETIRACETAM LEVEL  LACOSAMIDE  MAGNESIUM    EKG EKG Interpretation  Date/Time:  Thursday March 08 2021 14:05:31 EST Ventricular Rate:  98 PR Interval:  160 QRS Duration: 78 QT Interval:  332 QTC Calculation: 424 R Axis:   -30 Text Interpretation: Sinus rhythm Left axis deviation new Abnormal R-wave progression, late transition Borderline repolarization abnormality Confirmed by Gwyneth Sprout ([id number]) on 03/08/2021 2:17:39 PM  Radiology CT HEAD CODE STROKE WO CONTRAST  Result Date:  03/08/2021 CLINICAL DATA:  Code stroke. EXAM: CT HEAD WITHOUT CONTRAST TECHNIQUE: Contiguous axial images were obtained from the base of the skull through the vertex without intravenous contrast. RADIATION DOSE REDUCTION: This exam was performed according to the departmental dose-optimization program which includes automated exposure control, adjustment of the mA and/or kV according to patient size and/or use of iterative reconstruction technique. COMPARISON:  Brain MRI 07/20/2019, CT/CTA head and neck 05/25/2020 FINDINGS: Brain: There is no evidence  of acute intracranial hemorrhage, extra-axial fluid collection, or acute infarct. There is a background of mild global parenchymal volume loss with prominence of the ventricular system and extra-axial CSF spaces. Encephalomalacia with associated dystrophic calcification in the left temporoparietal region with associated ex vacuo dilatation of the left lateral ventricle is unchanged. Additional extensive confluent hypodensity in the subcortical and periventricular white matter is also not significantly changed, likely reflecting advanced chronic white matter microangiopathy. Gray-white differentiation is preserved. There is no solid mass lesion.  There is no midline shift. Vascular: There is calcification of the bilateral cavernous ICAs. Skull: Normal. Negative for fracture or focal lesion. Sinuses/Orbits: There is mild mucosal thickening in the paranasal sinuses. The globes and orbits are unremarkable. Other: None. ASPECTS [facility] Stroke Program Early CT Score) - Ganglionic level infarction (caudate, lentiform nuclei, internal capsule, insula, M1-M3 cortex): 7 - Supraganglionic infarction (M4-M6 cortex): 3 Total score (0-10 with 10 being normal): 10 IMPRESSION: 1. No evidence of acute intracranial hemorrhage or infarct. 2. ASPECTS is 10 3. Unchanged encephalomalacia and dystrophic calcification in the left temporoparietal region. These results were paged via [employer] at  the time of interpretation on 03/08/2021 at 1:54 pm to provider Bhagat. Electronically Signed   By: Lesia Hausen M.D.   On: 03/08/2021 13:52    Procedures Procedures    Medications Ordered in ED Medications  0.9 %  sodium chloride infusion (has no administration in time range)  sodium chloride 0.9 % bolus 1,000 mL (has no administration in time range)    ED Course/ Medical Decision Making/ A&P                           Medical Decision Making Amount and/or Complexity of Data Reviewed Labs: ordered.  Risk Prescription drug management.  Patient is a 66 year old male presenting today as a code stroke.  Patient's last known well was 1 hour and 20 minutes prior to arrival at noon.  Stroke team was at bedside upon patient's arrival.  His airway was cleared and he was taken directly to the CT scanner.  Patient does have a history of prior hemorrhagic stroke but also has a history of seizures and is on 3 antiepileptics Vimpat, Keppra and Depakote.  There was no report of any visualized seizure activity today however that is also a concern in addition to a stroke.  Patient currently appears to be more awake than what it was reported prior to arrival.  His blood sugar is within normal limits.  External medical records from his recent neurology appointment were evaluated.  Labs and imaging are pending.  2:40 PM I have independently interpreted patient's labs and EKG.  EKG today does have some more pronounced T wave inversion anteriorly than prior findings which can be repolarization abnormalities, labs today with new hyponatremia with a sodium of 127, stable creatinine of 0.44 no evidence of an anion gap.  CBC with minimal leukocytosis of 11.  Valproic acid level is still pending.  I independently viewed and interpreted head CT which does not show any sign of bleed today.  Radiology reported no evidence of acute intercranial hemorrhage or infarct and unchanged encephalomalacia.  Discussed with Dr.  Iver Nestle with neurology and she feels at this time it is most likely a breakthrough seizure.  However patient is getting an EEG for further monitoring.  Patient's sister is at baseline who gives additional information as patient is from home.  They are moving towards palliative care and  they report he is a DNR and they would not want intubation if he started having more pronounced seizures.  With patient's hyponatremia, possible seizure activity she recommended admission for ongoing care.  Also will get palliative care consult as they were supposed to meet with them next week to sign paperwork.  He is not currently receiving palliative services at home.  Patient does meet admission criteria today.   Findings discussed with the patient's sister who is in agreement with this plan.         Final Clinical Impression(s) / ED Diagnoses Final diagnoses:  Altered mental status, unspecified altered mental status type  Seizure (HCC)  Hyponatremia    Rx / DC Orders ED Discharge Orders     None         Gwyneth SproutPlunkett, Daniyla Pfahler, MD 03/08/21 1440

## 2021-03-08 NOTE — ED Notes (Signed)
CBG: 115 

## 2021-03-08 NOTE — ED Triage Notes (Signed)
Pt came for code stroke from home. Pt is bed bound and was unresponsive per family and had slurred speech/ left sided weakness. Pt is already weak on right side from past strokes. Pt LSN 1200.

## 2021-03-08 NOTE — Code Documentation (Signed)
Stroke Response Nurse Documentation Code Documentation  [name] is a 66 y.o. male arriving to [facility] ED via [facility] on 03/08/2021 with past medical hx of multiple prior strokes (right basal ganglia hemorrhage with residual left hemiparesis 2021, right PCA infarct 2020, right cerebellar infarct 2020 an d eft MCA infarct with hemorrhagic conversion 2004), cognitive impairment, atrial fibrillation, hyperlipidemia, hypertension, obesity, CAD s/p CABG 2019, DVT left upper extremity 2019 and seizure disorder. On aspirin 81 mg daily.   Patient from home where he is bed bound. He was LKW at 1200 and he started coughing then became unresponsive. Code stroke was activated by EMS.   Stroke team at the bedside on patient arrival. Labs drawn and patient cleared for CT by Dr. Maryan Rued. Patient to CT with team. NIHSS 33, see documentation for details and code stroke times. Baseline left hemiparesis from previous stroke. Patient with decreased LOC, disoriented, not following commands, left gaze preference , bilateral hemianopia, left facial droop, bilateral, left arm weakness, bilateral leg weakness, left decreased sensation, Global aphasia , dysarthria , and Visual  neglect on exam. The following imaging was completed: CT. Unable to obtain IV for CTA.  Patient is not a candidate for IV Thrombolytic due to history of hemorrhages. Patient is not a candidate for IR due to no LVO.   Care/Plan: EEG; Q15 min assessments until determined if patient is having seizures. Patient's sister at bedside aware of plan.  Bedside handoff with ED RN Herbert Spires.    Leverne Humbles Stroke Response RN

## 2021-03-08 NOTE — Procedures (Signed)
Patient Name: [name]  MRN: [id number]  Epilepsy Attending: [name]  Referring Physician/Provider: Gordy Councilman, MD Date: 03/08/2021 Duration: 22.43 mins  Patient history: 66yo M with a coughing spell and subsequently was unresponsive around noon. EEG to evaluate for seizure  Level of alertness: Awake, asleep  AEDs during EEG study: LEV, LCM, VPA  Technical aspects: This EEG study was done with scalp electrodes positioned according to the 10-20 International system of electrode placement. Electrical activity was acquired at a sampling rate of 500Hz  and reviewed with a high frequency filter of 70Hz  and a low frequency filter of 1Hz . EEG data were recorded continuously and digitally stored.   Description: The posterior dominant rhythm consists of 7.5 Hz activity of moderate voltage (25-35 uV) seen predominantly in posterior head regions, symmetric and reactive to eye opening and eye closing. Sleep was characterized by vertex waves, maximal frontocentral region. EEG showed intermittent generalized  3 to 6 Hz theta-delta slowing. Hyperventilation and photic stimulation were not performed.     ABNORMALITY - Intermittent slow, generalized  IMPRESSION: This study is suggestive of mild to moderate diffuse encephalopathy, nonspecific etiology. No seizures or epileptiform discharges were seen throughout the recording.  Eleena Grater 

## 2021-03-08 NOTE — H&P (Signed)
History and Physical    [name] Cruz DOB: 1955-11-10 DOA: 03/08/2021  PCP: Garwin Brothers, MD (Confirm with patient/family/NH records and if not entered, this has to be entered at [facility] point of entry) Patient coming from: Home  I have personally briefly reviewed patient's old medical records in [place]  Chief Complaint: Patient unresponsive  HPI: [name] Cruz is a 66 y.o. male with medical history significant of multiple strokes with residual left-sided hemiparesis, poststroke epilepsy, chronic aphasia, cognitive impairment, HTN, HLD, CAD status post CABG, sent from home for suspicious seizure activity.  Patient unable to provide any history, or history provided by patient's sister/POA bedside.  Sister reported that patient at baseline is bedbound, needs 24/7 care, with mom and other family member monitoring him at home and a Wi-Fi camera in place.  For the past few weeks, sister reported the patient has had episode of agitations and "staring episodes" which sister suspect to be breakthrough seizures.  Each episode last about 1 to 2 minutes and provoked by itself.  During the episode patient eyes fixed forward and remained unresponsive but no other seizure-like activities.  This afternoon, sister at work noticed on the Wi-Fi camera screen the patient appeared to be unresponsive to his mom, and patient also developed a cough and appears that " gurgly with some in his throat but cannot cough up".  And sister instructed family member to call EMS.  Given recent declining of overall conditions, family is in the process to set patient up for hospice care.  ED Course: Patient remained unresponsive.  Vital signs stable, afebrile.  CT head negative for acute findings.  Chest xray, no acute infiltrates.  Work, sodium 127, creatinine 2.4 BUN 8.  Review of Systems: Unable to perform, patient unresponsive.  Past Medical History:  Diagnosis Date   Acute deep vein thrombosis  (DVT) of left upper extremity (HCC)    L brachial and basilic veins, eliquis started 08/22/2017 to continue for 3 months total    Cognitive impairment 2007   after stroke, saw rehab but told to stop because was too upsetting to him   History of chicken pox    HLD (hyperlipidemia)    HTN (hypertension)    NSTEMI (non-ST elevated myocardial infarction) ([place]) 02/21/2017   Obesity    Stroke, hemorrhagic ([place]) 2007   thought 2/2 HTN (240sbp); residual cognitive impairment, loss of R peripheral field, no driving    Past Surgical History:  Procedure Laterality Date   ANKLE SURGERY  1990s   right foot with plate and screws   CORONARY ARTERY BYPASS GRAFT N/A 02/24/2017   3v Procedure: CORONARY ARTERY BYPASS GRAFTING (CABG) x 3 ON PUMP USING LEFT INTERNAL MAMMARY ARTERY TO LEFT ANTERIOR DESENDING CORNARY ARTERY, RIGHT GREATER SAPHENOUS VEIN TO LEFT CIRCUMFLEX ARTERY AND POSTERIOR DESENDING ARTERY. RIGHT GREATER SAPHENOUS VEIN OBTAINED VIA ENDOVEIN HARVEST.;  Surgeon: Grace Isaac, MD   IABP INSERTION N/A 02/21/2017   Procedure: IABP Insertion;  Surgeon: Nelva Bush, MD;  Location: [place] [facility];  Service: Cardiovascular;  Laterality: N/A;   LEFT HEART CATH AND CORONARY ANGIOGRAPHY N/A 02/21/2017   Procedure: LEFT HEART CATH AND CORONARY ANGIOGRAPHY;  Surgeon: Nelva Bush, MD;  Location: [place] [facility];  Service: Cardiovascular;  Laterality: N/A;   TEE WITHOUT CARDIOVERSION N/A 02/24/2017   Procedure: TRANSESOPHAGEAL ECHOCARDIOGRAM (TEE);  Surgeon: Grace Isaac, MD;  Location: [place];  Service: Open Heart Surgery;  Laterality: N/A;     reports that  he has never smoked. He has quit using smokeless tobacco. He reports that he does not currently use alcohol. He reports that he does not use drugs.  Allergies  Allergen Reactions   Losartan Other (See Comments)    hyperkalemia    Family History  Problem Relation Age of Onset   Alzheimer's disease Maternal Grandfather     Cancer Mother        lymphoma   Alcohol abuse Father        smoker   Coronary artery disease Neg Hx    Stroke Neg Hx    Diabetes Neg Hx      Prior to Admission medications   Medication Sig Start Date End Date Taking? Authorizing Provider  aspirin EC 81 MG tablet Take 1 tablet (81 mg total) by mouth daily. Swallow whole. 03/06/20  Yes McCue, Janett Billow, NP  atorvastatin (LIPITOR) 80 MG tablet TAKE 1 TABLET BY MOUTH EVERY DAY Patient taking differently: Take 80 mg by mouth daily. 05/17/20  Yes Ria Bush, MD  Cyanocobalamin (VITAMIN B-12) 1000 MCG SUBL Place 1,000 mcg under the tongue daily.   Yes [provider]  divalproex (DEPAKOTE) 250 MG DR tablet Take 3 tablets (750 mg total) by mouth every 12 (twelve) hours. 08/28/20 03/08/21 Yes McCue, Janett Billow, NP  lacosamide (VIMPAT) 200 MG TABS tablet Take 1 tablet (200 mg total) by mouth 2 (two) times daily. 09/04/20  Yes McCue, Janett Billow, NP  levETIRAcetam (KEPPRA) 750 MG tablet TAKE 2 TABLETS (1,500 MG TOTAL) BY MOUTH 2 (TWO) TIMES DAILY. 12/18/20  Yes McCue, Janett Billow, NP  Melatonin 10 MG TABS Take 20 mg by mouth at bedtime as needed (sleep).   Yes [provider]  Multiple Vitamin (MULTIVITAMIN WITH MINERALS) TABS tablet Take 1 tablet by mouth daily. 12/26/17  Yes Ria Bush, MD  sertraline (ZOLOFT) 25 MG tablet TAKE 1 TABLET BY MOUTH EVERY DAY Patient taking differently: Take 25 mg by mouth daily. 08/25/20  Yes Ria Bush, MD  ziprasidone (GEODON) 20 MG capsule TAKE 1 CAPSULE BY MOUTH EVERYDAY AT BEDTIME Patient taking differently: 20 mg at bedtime. 02/24/21  Yes Ria Bush, MD    Physical Exam: Vitals:   03/08/21 1515 03/08/21 1545 03/08/21 1600 03/08/21 1615  BP: 124/83 106/83 114/75 104/76  Pulse: 94 84 73 85  Resp: 17 17 16 16   Temp:      SpO2: 97% 95% 93% 93%  Weight:        Constitutional: NAD, calm, comfortable Vitals:   03/08/21 1515 03/08/21 1545 03/08/21 1600 03/08/21 1615  BP: 124/83  106/83 114/75 104/76  Pulse: 94 84 73 85  Resp: 17 17 16 16   Temp:      SpO2: 97% 95% 93% 93%  Weight:       Eyes: PERRL, lids and conjunctivae normal ENMT: Mucous membranes are moist. Posterior pharynx clear of any exudate or lesions.Normal dentition.  Neck: normal, supple, no masses, no thyromegaly Respiratory: clear to auscultation bilaterally, no wheezing, no crackles. Normal respiratory effort. No accessory muscle use.  Cardiovascular: Regular rate and rhythm, no murmurs / rubs / gallops. No extremity edema. 2+ pedal pulses. No carotid bruits.  Abdomen: no tenderness, no masses palpated. No hepatosplenomegaly. Bowel sounds positive.  Musculoskeletal: no clubbing / cyanosis. No joint deformity upper and lower extremities. Good ROM, no contractures. Normal muscle tone.  Skin: no rashes, lesions, ulcers. No induration Neurologic: Opens eyes, not following commands. Psychiatric: Opens eyes, not responding to direct questions    Labs on Admission:  I have personally reviewed following labs and imaging studies  CBC: Recent Labs  Lab 03/08/21 1335 03/08/21 1341  WBC 11.9*  --   NEUTROABS 7.1  --   HGB 14.0 14.3  HCT 40.8 42.0  MCV 94.7  --   PLT 246  --    Basic Metabolic Panel: Recent Labs  Lab 03/08/21 1335 03/08/21 1341 03/08/21 1409  NA 127* 128*  --   K 3.8 4.0  --   CL 93* 92*  --   CO2 25  --   --   GLUCOSE 111* 110*  --   BUN 8 9  --   CREATININE 0.44* 0.30*  --   CALCIUM 8.6*  --   --   MG  --   --  1.7   GFR: CrCl cannot be calculated (Unknown ideal weight.). Liver Function Tests: Recent Labs  Lab 03/08/21 1335  AST 25  ALT 21  ALKPHOS 51  BILITOT 0.5  PROT 5.4*  ALBUMIN 2.8*   No results for input(s): LIPASE, AMYLASE in the last 168 hours. No results for input(s): AMMONIA in the last 168 hours. Coagulation Profile: Recent Labs  Lab 03/08/21 1335  INR 1.0   Cardiac Enzymes: No results for input(s): CKTOTAL, CKMB, CKMBINDEX, TROPONINI in  the last 168 hours. BNP (last 3 results) No results for input(s): PROBNP in the last 8760 hours. HbA1C: No results for input(s): HGBA1C in the last 72 hours. CBG: Recent Labs  Lab 03/08/21 1331  GLUCAP 115*   Lipid Profile: No results for input(s): CHOL, HDL, LDLCALC, TRIG, CHOLHDL, LDLDIRECT in the last 72 hours. Thyroid Function Tests: No results for input(s): TSH, T4TOTAL, FREET4, T3FREE, THYROIDAB in the last 72 hours. Anemia Panel: No results for input(s): VITAMINB12, FOLATE, FERRITIN, TIBC, IRON, RETICCTPCT in the last 72 hours. Urine analysis:    Component Value Date/Time   COLORURINE YELLOW 05/26/2020 1506   APPEARANCEUR CLEAR 05/26/2020 1506   LABSPEC 1.012 05/26/2020 1506   PHURINE 7.0 05/26/2020 1506   GLUCOSEU NEGATIVE 05/26/2020 1506   HGBUR NEGATIVE 05/26/2020 [place] 05/26/2020 1506   [place] 05/26/2020 [employer] 05/26/2020 1506   UROBILINOGEN 0.2 04/26/2012 1900   NITRITE NEGATIVE 05/26/2020 [place] 05/26/2020 1506    Radiological Exams on Admission: DG CHEST PORT 1 VIEW  Result Date: 03/08/2021 CLINICAL DATA:  Cough EXAM: PORTABLE CHEST 1 VIEW COMPARISON:  Radiograph 02/05/2021 FINDINGS: Unchanged cardiomediastinal silhouette with prior median sternotomy and CABG. There is no focal airspace consolidation. There is no large pleural effusion. There is no visible pneumothorax. There is no acute osseous abnormality. IMPRESSION: No evidence of acute cardiopulmonary disease. Electronically Signed   By: Maurine Simmering M.D.   On: 03/08/2021 15:32   EEG adult  Result Date: 03/08/2021 Lora Havens, MD     03/08/2021  4:08 PM Patient Name: [name] Cruz MRN: [id number] Epilepsy Attending: Lora Havens Referring Physician/Provider: Lorenza Chick, MD Date: 03/08/2021 Duration: 22.43 mins Patient history: 66yo M with a coughing spell and subsequently was unresponsive around noon. EEG to evaluate  for seizure Level of alertness: Awake, asleep AEDs during EEG study: LEV, LCM, VPA Technical aspects: This EEG study was done with scalp electrodes positioned according to the 10-20 International system of electrode placement. Electrical activity was acquired at a sampling rate of 500Hz  and reviewed with a high frequency filter of 70Hz  and a low frequency filter of 1Hz . EEG data were recorded  continuously and digitally stored. Description: The posterior dominant rhythm consists of 7.5 Hz activity of moderate voltage (25-35 uV) seen predominantly in posterior head regions, symmetric and reactive to eye opening and eye closing. Sleep was characterized by vertex waves, maximal frontocentral region. EEG showed intermittent generalized  3 to 6 Hz theta-delta slowing. Hyperventilation and photic stimulation were not performed.   ABNORMALITY - Intermittent slow, generalized IMPRESSION: This study is suggestive of mild to moderate diffuse encephalopathy, nonspecific etiology. No seizures or epileptiform discharges were seen throughout the recording. Lora Havens   CT HEAD CODE STROKE WO CONTRAST  Result Date: 03/08/2021 CLINICAL DATA:  Code stroke. EXAM: CT HEAD WITHOUT CONTRAST TECHNIQUE: Contiguous axial images were obtained from the base of the skull through the vertex without intravenous contrast. RADIATION DOSE REDUCTION: This exam was performed according to the departmental dose-optimization program which includes automated exposure control, adjustment of the mA and/or kV according to patient size and/or use of iterative reconstruction technique. COMPARISON:  Brain MRI 07/20/2019, CT/CTA head and neck 05/25/2020 FINDINGS: Brain: There is no evidence of acute intracranial hemorrhage, extra-axial fluid collection, or acute infarct. There is a background of mild global parenchymal volume loss with prominence of the ventricular system and extra-axial CSF spaces. Encephalomalacia with associated dystrophic  calcification in the left temporoparietal region with associated ex vacuo dilatation of the left lateral ventricle is unchanged. Additional extensive confluent hypodensity in the subcortical and periventricular white matter is also not significantly changed, likely reflecting advanced chronic white matter microangiopathy. Gray-white differentiation is preserved. There is no solid mass lesion.  There is no midline shift. Vascular: There is calcification of the bilateral cavernous ICAs. Skull: Normal. Negative for fracture or focal lesion. Sinuses/Orbits: There is mild mucosal thickening in the paranasal sinuses. The globes and orbits are unremarkable. Other: None. ASPECTS [facility] Stroke Program Early CT Score) - Ganglionic level infarction (caudate, lentiform nuclei, internal capsule, insula, M1-M3 cortex): 7 - Supraganglionic infarction (M4-M6 cortex): 3 Total score (0-10 with 10 being normal): 10 IMPRESSION: 1. No evidence of acute intracranial hemorrhage or infarct. 2. ASPECTS is 10 3. Unchanged encephalomalacia and dystrophic calcification in the left temporoparietal region. These results were paged via AMION at the time of interpretation on 03/08/2021 at 1:54 pm to provider Bhagat. Electronically Signed   By: Valetta Mole M.D.   On: 03/08/2021 13:52    EKG: Independently reviewed. Sinus, poor R wave progression.  Assessment/Plan Principal Problem:   Seizure [facility]) Active Problems:   Seizure disorder ([place])  (please populate well all problems here in Problem List. (For example, if patient is on BP meds at home and you resume or decide to hold them, it is a problem that needs to be her. Same for CAD, COPD, HLD and so on)   Acute metabolic encephalopathy -EEG done showed no seizure activity, breakthrough seizure unlikely. -We will check ammonium level, B12, TSH and VBG to rule out CO2 retention. -Admit to telemetry for close monitoring.  Frequent neurochecks.  Neurology follows. -Infections unlikely,  UA pending, no PNA on xray. -Sister/POA discussed with ED physician regarding goals of care, and sister agreed with consult palliative care.  Confirmed with the sister patient is DNR.  Hyponatremia -Acute on chronic, doubt this will contribute to patient mentation changes.  Patient takes multiple antiseizure medication and antipsychotic medications likely has acquired SIADH.  Fluid restriction for now.  Seizure disorder -Continue Depakote, Keppra and Vimpart  History of multiple strokes with left-sided hemiparesis -At baseline.  DVT prophylaxis: Lovenox  Code Status: DNR Family Communication: Sister/POA at bedside Disposition Plan: Patient is sick with after mentations of unknown underlying neurology, expect more than 2 midnight hospital stay. Consults called: Neurology and palliative care Admission status: Telemetry admission.   Lequita Halt MD [facility] Pager [phone]  03/08/2021, 5:11 PM

## 2021-03-08 NOTE — Progress Notes (Signed)
EEG complete - results pending 

## 2021-03-09 DIAGNOSIS — Z7189 Other specified counseling: Secondary | ICD-10-CM

## 2021-03-09 DIAGNOSIS — R638 Other symptoms and signs concerning food and fluid intake: Secondary | ICD-10-CM

## 2021-03-09 DIAGNOSIS — Z66 Do not resuscitate: Secondary | ICD-10-CM

## 2021-03-09 DIAGNOSIS — Z515 Encounter for palliative care: Secondary | ICD-10-CM

## 2021-03-09 DIAGNOSIS — G40909 Epilepsy, unspecified, not intractable, without status epilepticus: Principal | ICD-10-CM

## 2021-03-09 DIAGNOSIS — Z789 Other specified health status: Secondary | ICD-10-CM

## 2021-03-09 DIAGNOSIS — Z711 Person with feared health complaint in whom no diagnosis is made: Secondary | ICD-10-CM

## 2021-03-09 LAB — CBC
HCT: 35.7 % — ABNORMAL LOW (ref 39.0–52.0)
Hemoglobin: 12.4 g/dL — ABNORMAL LOW (ref 13.0–17.0)
MCH: 33.1 pg (ref 26.0–34.0)
MCHC: 34.7 g/dL (ref 30.0–36.0)
MCV: 95.2 fL (ref 80.0–100.0)
Platelets: 189 10*3/uL (ref 150–400)
RBC: 3.75 MIL/uL — ABNORMAL LOW (ref 4.22–5.81)
RDW: 13.4 % (ref 11.5–15.5)
WBC: 10.3 10*3/uL (ref 4.0–10.5)
nRBC: 0 % (ref 0.0–0.2)

## 2021-03-09 LAB — BASIC METABOLIC PANEL
Anion gap: 8 (ref 5–15)
BUN: 6 mg/dL — ABNORMAL LOW (ref 8–23)
CO2: 23 mmol/L (ref 22–32)
Calcium: 8.2 mg/dL — ABNORMAL LOW (ref 8.9–10.3)
Chloride: 101 mmol/L (ref 98–111)
Creatinine, Ser: 0.38 mg/dL — ABNORMAL LOW (ref 0.61–1.24)
GFR, Estimated: >60 mL/min (ref >60)
Glucose, Bld: 93 mg/dL (ref 70–99)
Potassium: 3.7 mmol/L (ref 3.5–5.1)
Sodium: 132 mmol/L — ABNORMAL LOW (ref 135–145)

## 2021-03-09 LAB — LEVETIRACETAM LEVEL: Levetiracetam Lvl: 79 ug/mL — ABNORMAL HIGH (ref 10.0–40.0)

## 2021-03-09 LAB — OSMOLALITY, URINE: Osmolality, Ur: 230 mOsm/kg — ABNORMAL LOW (ref 300–900)

## 2021-03-09 LAB — SODIUM, URINE, RANDOM: Sodium, Ur: 17 mmol/L

## 2021-03-09 NOTE — Evaluation (Signed)
Clinical/Bedside Swallow Evaluation Patient Details  Name: [name] Cruz MRN: [id number] Date of Birth: 03-Jun-1955  Today's Date: 03/09/2021 Time: SLP Start Time (ACUTE ONLY): 0850 SLP Stop Time (ACUTE ONLY): 0910 SLP Time Calculation (min) (ACUTE ONLY): 20 min  Past Medical History:  Past Medical History:  Diagnosis Date   Acute deep vein thrombosis (DVT) of left upper extremity (HCC)    L brachial and basilic veins, eliquis started 08/22/2017 to continue for 3 months total    Cognitive impairment 2007   after stroke, saw rehab but told to stop because was too upsetting to him   History of chicken pox    HLD (hyperlipidemia)    HTN (hypertension)    NSTEMI (non-ST elevated myocardial infarction) (HCC) 02/21/2017   Obesity    Stroke, hemorrhagic (HCC) 2007   thought 2/2 HTN (240sbp); residual cognitive impairment, loss of R peripheral field, no driving   Past Surgical History:  Past Surgical History:  Procedure Laterality Date   ANKLE SURGERY  1990s   right foot with plate and screws   CORONARY ARTERY BYPASS GRAFT N/A 02/24/2017   3v Procedure: CORONARY ARTERY BYPASS GRAFTING (CABG) x 3 ON PUMP USING LEFT INTERNAL MAMMARY ARTERY TO LEFT ANTERIOR DESENDING CORNARY ARTERY, RIGHT GREATER SAPHENOUS VEIN TO LEFT CIRCUMFLEX ARTERY AND POSTERIOR DESENDING ARTERY. RIGHT GREATER SAPHENOUS VEIN OBTAINED VIA ENDOVEIN HARVEST.;  Surgeon: Delight Ovens, MD   IABP INSERTION N/A 02/21/2017   Procedure: IABP Insertion;  Surgeon: Yvonne Kendall, MD;  [facility]: [facility];  Service: Cardiovascular;  Laterality: N/A;   LEFT HEART CATH AND CORONARY ANGIOGRAPHY N/A 02/21/2017   Procedure: LEFT HEART CATH AND CORONARY ANGIOGRAPHY;  Surgeon: Yvonne Kendall, MD;  Location: [facility];  Service: Cardiovascular;  Laterality: N/A;   TEE WITHOUT CARDIOVERSION N/A 02/24/2017   Procedure: TRANSESOPHAGEAL ECHOCARDIOGRAM (TEE);  Surgeon: Delight Ovens, MD;  Location: [facility];  Service: Open  Heart Surgery;  Laterality: N/A;   HPI:  66yo male admitted 03/08/21 with unresponsiveness and suspicious seizure activity. PMH: multiple CVAs (R BG hemorrhage, R PCA, R cerebellar, L MCA with hemorrhagic conversion), left hemiparesis, post-stroke epilepsy, chronic aphasia, cognitive impairment, HTN, HLD, CAD s/p CABG, AFIb, DVT LUE.    Assessment / Plan / Recommendation  Clinical Impression  Pt presents with functional swallow. Baseline left orofacial weakness noted. Pt has upper and lower dentures. He reports no difficulty swallowing food, liquid, or whole meds with liquid. Pt  tolerated trials of thin liquid, puree, and solid textures with adequate oral prep and control. No anterior leakage of any consistency observed. Pt was unable to demonstrate ability to successfully drink from the side of the cup, but was able to consume >3oz consecutively without overt s/s aspiration. Puree and solid textures were tolerated without overt s/s aspiration as well. Recommend mechanical soft diet at this time, primarily for energy conservation. Thin liquids via cup, meds whole with liquid. Safe swallow precautions posted at [facility]. SLP will follow briefly to assess tolerance to current diet and determine appropriateness to advance textures.  SLP Visit Diagnosis: Dysphagia, unspecified (R13.10)    Aspiration Risk  Mild aspiration risk    Diet Recommendation Dysphagia 3 (Mech soft);Thin liquid   Liquid Administration via: Straw Medication Administration: Whole meds with liquid Supervision: Full supervision/cueing for compensatory strategies;Staff to assist with self feeding Compensations: Minimize environmental distractions;Slow rate;Small sips/bites Postural Changes: Seated upright at 90 degrees    Other  Recommendations Oral Care Recommendations: Oral care BID  Recommendations for follow up therapy are one component of a multi-disciplinary discharge planning process, led by the attending physician.   Recommendations may be updated based on patient status, additional functional criteria and insurance authorization.  Follow up Recommendations Other (comment) (TBD)      Assistance Recommended at Discharge Frequent or constant Supervision/Assistance  Functional Status Assessment Patient has not had a recent decline in their functional status  Frequency and Duration min 1 x/week  1 week;2 weeks       Prognosis Prognosis for Safe Diet Advancement: Fair Barriers to Reach Goals: Cognitive deficits;Language deficits      Swallow Study   General Date of Onset: 03/08/21 HPI: 66yo male admitted 03/08/21 with unresponsiveness and suspicious seizure activity. PMH: multiple CVAs (R BG hemorrhage, R PCA, R cerebellar, L MCA with hemorrhagic conversion), left hemiparesis, post-stroke epilepsy, chronic aphasia, cognitive impairment, HTN, HLD, CAD s/p CABG, AFIb, DVT LUE. Type of Study: Bedside Swallow Evaluation Previous Swallow Assessment: BSE 05/2020, CIR  CIR 7/13-08/17/19 Diet Prior to this Study: NPO Temperature Spikes Noted: No Respiratory Status: Room air History of Recent Intubation: No Behavior/Cognition: Alert;Cooperative;Doesn't follow directions;Distractible;Requires cueing Oral Cavity Assessment: Within Functional Limits Oral Care Completed by SLP: No Oral Cavity - Dentition: Dentures, top;Dentures, bottom Vision: Functional for self-feeding Self-Feeding Abilities: Total assist Patient Positioning: Upright in bed Baseline Vocal Quality: Normal Volitional Cough: Cognitively unable to elicit Volitional Swallow: Unable to elicit    Oral/Motor/Sensory Function Overall Oral Motor/Sensory Function: Mild impairment Facial ROM: Reduced left Facial Symmetry: Abnormal symmetry left Facial Strength: Reduced left Lingual ROM: Other (Comment) (unable to assess. Speech is intelligible) Lingual Symmetry: Other (Comment) (CNT) Lingual Strength: Within Functional Limits Velum: Other (comment)  (CNT) Mandible: Within Functional Limits   Ice Chips Ice chips: Within functional limits Presentation: Spoon   Thin Liquid Thin Liquid: Within functional limits Presentation: Straw Other Comments: unable to successfully drink from side of the cup    Nectar Thick Nectar Thick Liquid: Not tested   Honey Thick Honey Thick Liquid: Not tested   Puree Puree: Within functional limits Presentation: Spoon   Solid     Solid: Within functional limits     Alajia Schmelzer B. Quentin Ore, [facility], [place] Speech Language Pathologist Office: [phone]  Shonna Chock 03/09/2021,9:29 AM

## 2021-03-09 NOTE — Progress Notes (Signed)
Neurology Progress Note  Subjective: No acute overnight events. Patient's mental status has improved on examination today.   Exam: Vitals:   03/09/21 0349 03/09/21 0724  BP: 111/88 119/84  Pulse: 95 89  Resp: 17   Temp: 98.5 F (36.9 C) 98.5 F (36.9 C)  SpO2: 97% 97%   Gen: Laying comfortably in bed, in no acute distress Resp: non-labored breathing, no respiratory distress Abd: soft, non-tender, non-distended  Neuro: Mental Status: Awake, alert to self. He answers some "yes/no" questions appropriately. He states that he is unable to identify the current month, year, or his age. He states "yes" to being in the hospital and states that he is in the hospital because he had a stroke. He is unable to provide further details regarding events leading to hospitalization.  Prior to examiner entering the room, patient yells out into the hallway "I need help". On entering, the patient states that he needs help in order to get up.  He is unable to name objects or repeat phrases. Patient does have some perseveration throughout assessment.  He follows some simple commands rarely with repeat instruction.  Cranial Nerves: PERRL, EOMI, there is a left facial droop, hearing is intact to voice, phonation intact, tongue is midline.  Motor: Spastic tone on the left upper and lower extremity with decreased bulk on the left compared to the right.  He is able to elevate his right upper extremity and sustain antigravity movement without vertical drift with constant coaching. His right lower extremity elevates minimally antigravity. Left lower extremity patient is able to wiggle toes but he does not attempt antigravity movement.  Sensory: Patient reports sensation is intact and symmetric to light touch in bilateral upper and lower extremities with questionable reliability  DTR: 3+ biceps, 3+ left patellae and 2+ right patellae- relatively increased on the left compared to right  Plantars: Toes upgoing  bilaterally  Gait: Deferred, patient is nonambulatory at baseline  Pertinent Labs: CBC    Component Value Date/Time   WBC 10.3 03/09/2021 0408   RBC 3.75 (L) 03/09/2021 0408   HGB 12.4 (L) 03/09/2021 0408   HGB 12.7 (L) 09/24/2017 1327   HCT 35.7 (L) 03/09/2021 0408   HCT 37.9 09/24/2017 1327   PLT 189 03/09/2021 0408   PLT 302 09/24/2017 1327   MCV 95.2 03/09/2021 0408   MCV 93 09/24/2017 1327   MCH 33.1 03/09/2021 0408   MCHC 34.7 03/09/2021 0408   RDW 13.4 03/09/2021 0408   RDW 14.0 09/24/2017 1327   LYMPHSABS 2.9 03/08/2021 1335   MONOABS 1.7 (H) 03/08/2021 1335   EOSABS 0.0 03/08/2021 1335   BASOSABS 0.0 03/08/2021 1335   CMP     Component Value Date/Time   NA 132 (L) 03/09/2021 0408   NA 139 12/02/2017 1154   K 3.7 03/09/2021 0408   CL 101 03/09/2021 0408   CO2 23 03/09/2021 0408   GLUCOSE 93 03/09/2021 0408   BUN 6 (L) 03/09/2021 0408   BUN 15 12/02/2017 1154   CREATININE 0.38 (L) 03/09/2021 0408   CALCIUM 8.2 (L) 03/09/2021 0408   PROT 5.4 (L) 03/08/2021 1335   PROT 6.6 07/11/2017 0950   ALBUMIN 2.8 (L) 03/08/2021 1335   ALBUMIN 4.3 07/11/2017 0950   AST 25 03/08/2021 1335   ALT 21 03/08/2021 1335   ALKPHOS 51 03/08/2021 1335   BILITOT 0.5 03/08/2021 1335   BILITOT 0.2 07/11/2017 0950   GFRNONAA >60 03/09/2021 0408   GFRAA >60 10/09/2019 1533   Lab  Results  Component Value Date   PHENYTOIN 3.4 (L) 08/17/2017   VALPROATE 53 03/08/2021   Imaging Reviewed:  CT Head wo contrast 2/23: 1. No evidence of acute intracranial hemorrhage or infarct. 2. ASPECTS is 10 3. Unchanged encephalomalacia and dystrophic calcification in the left temporoparietal region.  Routine EEG 2/23: "This study is suggestive of mild to moderate diffuse encephalopathy, nonspecific etiology. No seizures or epileptiform discharges were seen throughout the recording."  Assessment:  66 y.o. who presented to the ED on 2/23 for evaluation of unresponsiveness around noon. Most likely  etiology is breakthrough seizure given patient's prior history of seizure.  Etiology of breakthrough seizures is felt most likely to be related to hyponatremia in the setting of poor oral intake with valproate level therapeutic at 53.  Will additionally check other antiseizure medication levels to confirm adherence and absorption. We will hold off on adjusting antiseizure medications pending levels.  Recommendations: - Keppra and lacosamide levels pending - Magnesium level, replete as needed; given this resulted borderline low at 1.7, please do continue to monitor and replete if needed - Work-up and treatment of hyponatremia per ED/primary team - Continue goals of care discussion; of note sister [name] reports he would not want to be intubated even temporarily for seizure control - Medication levels pending, if low, notify neurology for further medication adjustment. If patient continues to remain at his recent baseline and goals of care are focused on comfort we will be available on an as-needed basis.  Please do reach out if patient has neurological deterioration.  Anibal Henderson, AGACNP-BC [facility] [phone]  Electronically signed: Dr. Kerney Elbe

## 2021-03-09 NOTE — Consult Note (Signed)
Consultation Note Date: 03/09/2021   Patient Name: [name] Cruz  DOB: [birth year]  MRN: [id number]  Age / Sex: 66 y.o., male  PCP: Garwin Brothers, MD Referring Physician: Hosie Poisson, MD  Reason for Consultation: Establishing goals of care, "bed bound, minimally verbal, prior stroke"  HPI/Patient Profile: 66 y.o. male  with past medical history of multiple strokes with residual left-sided hemiparesis, poststroke epilepsy, chronic aphasia, cognitive impairment, HTN, HLD, CAD status post CABG presented to ED on 03/08/21 from home with family concerns of unresponsiveness - on arrival to ED code stroke was called. Patient at baseline is bedbound requiring 24/7 total care. Neurology was consulted and felt this was likely breakthrough seizure activity due to electrolyte derangements - patient is already on three seizure medications. Patient was admitted on 03/08/2021 with acute metabolic encephalopathy, hyponatremia, seizure disorder, history of multiple strokes with left sided hemiparesis.   Clinical Assessment and Goals of Care: I have reviewed medical records including EPIC notes, labs, and imaging. Received report from primary RN - no acute concerns. RN reports patient at 50% of breakfast, is confused, is able to participate in simple conversation.   Went to visit patient at bedside - no family/visitors present. Patient was lying in bed asleep - I did not attempt to wake him. No signs or non-verbal gestures of pain or discomfort noted. No respiratory distress, increased work of breathing, or secretions noted.   Met with HCPOA/sister/[name] via phone  to discuss diagnosis, prognosis, GOC, EOL wishes, disposition, and options.  I introduced Palliative Medicine as specialized medical care for people living with serious illness. It focuses on providing relief from the symptoms and stress of a serious illness. The  goal is to improve quality of life for both the patient and the family.  We discussed a brief life review of the patient as well as functional and nutritional status. Patient is not married; he does have three children; however, one has passed away, one is in jail, and the other is not very involved in his life. Patient does also have 4 sisters. Prior to hospitalization, patient was living in a private residence with one of his sisters and his mother. Patient is bedbound at baseline requiring total care that is provided by his family - he does not have [facility] services. Family have all DME needed in the home to care for him. [name] does report patient's appetite has decreased recently. Albumin noted to be 2.8 on 03/08/21.  We discussed patient's current illness and what it means in the larger context of patient's on-going co-morbidities. [name] understands that patient remains at high risk for recurrent seizures, strokes, and rehospitalizations. She tells me she and patient have discussed his wishes and he is "at peace with the Reita Cliche" and "wouldn't want to be on tubes." [name] tells me they had an appointment a hospice organization next Monday at 2:30p but she is unsure of which organization it was. Natural disease trajectory and expectations at EOL were discussed. I attempted to elicit values and goals of  care important to the patient. The difference between aggressive medical intervention and comfort care was considered in light of the patient's goals of care. Goals in house are to continue current gentle medical interventions - in the event of a decline no heroic measures, she would be interested in transition to full comfort at that time.   [name] would like to get hospice referral initiated today so it can be set up for discharge.  [name] confirms DNR/DNI status.  Discussed with family the importance of continued conversation with each other and the medical providers regarding overall plan of care and  treatment options, ensuring decisions are within the context of the patients values and GOCs.    Questions and concerns were addressed. The patient/family was encouraged to call with questions and/or concerns. PMT number was provided.  Spoke with Clementeen Graham of the [facility] liaison who confirms patient had an appointment with them on Monday. She will follow. TOC notified.   Primary Decision Maker: HCPOA/sister/[name] - HCPOA documents found on 08/08/2016 scan    SUMMARY OF RECOMMENDATIONS   Continue gentle medical interventions. In the event of a decline, no heroic measures - HCPOA would be open to transition to full comfort care at that time Continue DNR/DNI as previously documented - durable DNR form completed and placed in shadow chart. Copy was made and will be scanned into [facility]/ACP tab Goal at discharge is to go home with hospice [facility] notified and consulted for: home hospice referral to [place] PMT will continue to follow peripherally. If there are any imminent needs please call the service directly  Code Status/Advance Care Planning: DNR  Palliative Prophylaxis:  Aspiration, Bowel Regimen, Frequent Pain Assessment, Oral Care, and Turn Reposition  Additional Recommendations (Limitations, Scope, Preferences): Full Scope Treatment, No Artificial Feeding, and No Tracheostomy  Psycho-social/Spiritual:  Desire for further Chaplaincy support:no Created space and opportunity for patient and family to express thoughts and feelings regarding patient's current medical situation.  Emotional support and therapeutic listening provided.  Prognosis:  < 6 months  Discharge Planning: Home with Hospice      Primary Diagnoses: Present on Admission: **None**   I have reviewed the medical record, interviewed the patient and family, and examined the patient. The following aspects are pertinent.  Past Medical History:  Diagnosis Date   Acute deep vein  thrombosis (DVT) of left upper extremity (HCC)    L brachial and basilic veins, eliquis started 08/22/2017 to continue for 3 months total    Cognitive impairment 2007   after stroke, saw rehab but told to stop because was too upsetting to him   History of chicken pox    HLD (hyperlipidemia)    HTN (hypertension)    NSTEMI (non-ST elevated myocardial infarction) ([place]) 02/21/2017   Obesity    Stroke, hemorrhagic ([place]) 2007   thought 2/2 HTN (240sbp); residual cognitive impairment, loss of R peripheral field, no driving   Social History   Socioeconomic History   Marital status: Single    Spouse name: Not on file   Number of children: 2   Years of education: Not on file   Highest education level: 10th grade  Occupational History    Comment: Unemployed   Tobacco Use   Smoking status: Never   Smokeless tobacco: Former  Scientific laboratory technician Use: Never used  Substance and Sexual Activity   Alcohol use: Not Currently   Drug use: No   Sexual activity: Not Currently  Other Topics Concern  Not on file  Social History Narrative   Caffeine: 1 pot coffee   Lives with oldest sister Diane in [place], [place]   Occupation: disabled, was Nature conservation officer   Does not drive.   Activity: no regular exercise   Diet: good water, drinks V8 juice   Right handed       CareTeam:   Neurology - Dr Leonie Man    Cardiology - Dr Lendell Caprice (last seen 11/2017)   Cardiothoracic surgery - Dr Servando Snare (last seen 03/2017)      Sister [name] is HCPOA and manages his medications.    Mother Dot, oldest sister Diane also care for him.    Also has younger sister Butch Penny who is also involved   Social Determinants of Radio broadcast assistant Strain: Low Risk    Difficulty of Paying Living Expenses: Not very hard  Food Insecurity: Not on file  Transportation Needs: Not on file  Physical Activity: Not on file  Stress: Not on file  Social Connections: Not on file   Family History  Problem Relation Age of Onset    Alzheimer's disease Maternal Grandfather    Cancer Mother        lymphoma   Alcohol abuse Father        smoker   Coronary artery disease Neg Hx    Stroke Neg Hx    Diabetes Neg Hx    Scheduled Meds:  aspirin EC  81 mg Oral Daily   atorvastatin  80 mg Oral Daily   chlorhexidine gluconate (MEDLINE KIT)  15 mL Mouth Rinse BID   divalproex  750 mg Oral Q12H   enoxaparin (LOVENOX) injection  40 mg Subcutaneous Q24H   lacosamide  200 mg Oral BID   levETIRAcetam  1,500 mg Oral BID   mouth rinse  15 mL Mouth Rinse 10 times per day   multivitamin with minerals  1 tablet Oral Daily   sertraline  25 mg Oral Daily   vitamin B-12  1,000 mcg Oral Daily   ziprasidone  20 mg Oral QHS   Continuous Infusions:  sodium chloride 100 mL/hr at 03/09/21 0757   PRN Meds:.melatonin Medications Prior to Admission:  Prior to Admission medications   Medication Sig Start Date End Date Taking? Authorizing Provider  aspirin EC 81 MG tablet Take 1 tablet (81 mg total) by mouth daily. Swallow whole. 03/06/20  Yes McCue, Janett Billow, NP  atorvastatin (LIPITOR) 80 MG tablet TAKE 1 TABLET BY MOUTH EVERY DAY Patient taking differently: Take 80 mg by mouth daily. 05/17/20  Yes Ria Bush, MD  Cyanocobalamin (VITAMIN B-12) 1000 MCG SUBL Place 1,000 mcg under the tongue daily.   Yes [provider]  divalproex (DEPAKOTE) 250 MG DR tablet Take 3 tablets (750 mg total) by mouth every 12 (twelve) hours. 08/28/20 03/08/21 Yes McCue, Janett Billow, NP  lacosamide (VIMPAT) 200 MG TABS tablet Take 1 tablet (200 mg total) by mouth 2 (two) times daily. 09/04/20  Yes McCue, Janett Billow, NP  levETIRAcetam (KEPPRA) 750 MG tablet TAKE 2 TABLETS (1,500 MG TOTAL) BY MOUTH 2 (TWO) TIMES DAILY. 12/18/20  Yes McCue, Janett Billow, NP  Melatonin 10 MG TABS Take 20 mg by mouth at bedtime as needed (sleep).   Yes [provider]  Multiple Vitamin (MULTIVITAMIN WITH MINERALS) TABS tablet Take 1 tablet by mouth daily. 12/26/17  Yes Ria Bush, MD  sertraline (ZOLOFT) 25 MG tablet TAKE 1 TABLET BY MOUTH EVERY DAY Patient taking differently: Take 25 mg by mouth daily. 08/25/20  Yes Ria Bush, MD  ziprasidone (GEODON) 20 MG capsule TAKE 1 CAPSULE BY MOUTH EVERYDAY AT BEDTIME Patient taking differently: 20 mg at bedtime. 02/24/21  Yes Ria Bush, MD   Allergies  Allergen Reactions   Losartan Other (See Comments)    hyperkalemia   Review of Systems  Unable to perform ROS: Other   Physical Exam Vitals and nursing note reviewed.  Constitutional:      General: He is not in acute distress. Pulmonary:     Effort: No respiratory distress.  Skin:    General: Skin is warm and dry.  Neurological:     Motor: Weakness present.     Comments: sleeping    Vital Signs: BP 119/84 (BP Location: Left Arm)    Pulse 89    Temp 98.5 F (36.9 C) (Oral)    Resp 17    Wt 70 kg    SpO2 97%    BMI 27.34 kg/m  Pain Scale: PAINAD POSS *See Group Information*: S-Acceptable,Sleep, easy to arouse     SpO2: SpO2: 97 % O2 Device:SpO2: 97 % O2 Flow Rate: .   IO: Intake/output summary:  Intake/Output Summary (Last 24 hours) at 03/09/2021 1116 Last data filed at 03/09/2021 0300 Gross per 24 hour  Intake 1981.52 ml  Output --  Net 1981.52 ml    LBM: Last BM Date :  (unknown) Baseline Weight: Weight: 70 kg Most recent weight: Weight: 70 kg     Palliative Assessment/Data: PPS 30%     Time In: 1135 Time Out: 1250 Time Total: 75 minutes  Greater than 50%  of this time was spent counseling and coordinating care related to the above assessment and plan.  Signed by: Lin Landsman, NP   Please contact Palliative Medicine Team phone at [phone] for questions and concerns.  For individual provider: See Shea Evans

## 2021-03-09 NOTE — Plan of Care (Signed)

## 2021-03-09 NOTE — TOC Initial Note (Signed)
Transition of Care [facility](TOC) - Initial/Assessment Note    Patient Details  Name: [name] Cruz J Buckel MRN: [id number] Date of Birth: October 21, 1955  Transition of Care [facility](TOC) CM/SW Contact:    Kermit BaloKelli F Kendrick Haapala, RN Phone Number: 03/09/2021, 3:07 PM  Clinical Narrative:                 Patient is from home with family. He has all needed DME per family. Family is interested in home hospice at discharge. They have been talking with [facility] and Cheri with [facility] is following.  TOC following.  Expected Discharge Plan: Home w Hospice Care Barriers to Discharge: Continued Medical Work up   Patient Goals and CMS Choice   CMS Medicare.gov Compare Post Acute Care list provided to:: Patient Represenative (must comment) Choice offered to / list presented to : Sibling  Expected Discharge Plan and Services Expected Discharge Plan: Home w Hospice Care   Discharge Planning Services: CM Consult   Living arrangements for the past 2 months: Single Family Home                             [facility]: [facility]   [facility]  Prior Living Arrangements/Services Living arrangements for the past 2 months: Single Family Home Lives with:: Siblings Patient language and need for interpreter reviewed:: Yes Do you feel safe going back to the place where you live?: Yes            Criminal Activity/Legal Involvement Pertinent to Current Situation/Hospitalization: No - Comment as needed  Activities of Daily Living      Permission Sought/Granted                  Emotional Assessment Appearance:: Appears older than stated age         Psych Involvement: No (comment)  Admission diagnosis:  Cough [R05.9] Hyponatremia [E87.1] Seizure (HCC) [R56.9] Altered mental status, unspecified altered mental status type [R41.82] Patient Active Problem List   Diagnosis Date Noted   Seizure (HCC) 03/08/2021   Cough  03/16/2020   Bowel and bladder incontinence 10/22/2019   SAH (subarachnoid hemorrhage) (HCC)    Neurogenic bladder    Hemiparesis affecting left side as late effect of stroke (HCC)    Thalamic hemorrhage (HCC) 07/26/2019   History of DVT in adulthood 02/21/2019   PAD (peripheral artery disease) (HCC) 12/14/2018   Agitation 09/04/2018   Acute CVA (cerebrovascular accident) (HCC) 08/07/2018   Cerebellar stroke, acute (HCC) 07/10/2018   Status epilepticus (HCC) 03/25/2018   Unilateral occipital headache 10/03/2017   Ischemic stroke (HCC) 08/23/2017   Coronary artery disease involving coronary bypass graft of native heart without angina pectoris    Atrial fibrillation (HCC)    Dysphagia, post-stroke    Cardiac arrest (HCC) 08/09/2017   S/P CABG x 3 02/24/2017   NSTEMI (non-ST elevated myocardial infarction) (HCC) 02/21/2017   Encephalomalacia 10/28/2012   Seizure disorder (HCC) 04/26/2012   Alcohol abuse 04/26/2012   History of stroke with current residual effects    Cognitive impairment    HLD (hyperlipidemia)    HTN (hypertension)    PCP:  Eloisa NorthernAmin, Saad, MD Pharmacy:   [id number] - [place], [place] - [address] [place] [id number] Phone: [phone] Fax: [phone]     Social Determinants of Health (SDOH) Interventions    Readmission Risk Interventions  No flowsheet data found.

## 2021-03-09 NOTE — Progress Notes (Signed)
° °  Referral was received from NP with [facility] office Amber Tamala Julian. I have spoke to the pt's sister and Elisabeth Pigeon. She does confirm the pt wants to focus on comfort care at discharge. The pt has been approved for hospice services at home by our MD Dr. Otelia Santee. We will follow and assist with enrollment into services at d/c.   [name] reports that they have all equipment they need in the home. They own a hospital bed, W/C hoyer lift, BSC and shower chair.   Webb Silversmith RN [phone]

## 2021-03-09 NOTE — Progress Notes (Signed)
PROGRESS NOTE    [name] Cruz  ZOX:[id number] DOB: 02-17-1955 DOA: 03/08/2021 PCP: Garwin Brothers, MD    Chief Complaint  Patient presents with   Code Stroke    Brief Narrative:  [name] Cruz is a 66 y.o. male with medical history significant of multiple strokes with residual left-sided hemiparesis, poststroke epilepsy, chronic aphasia, cognitive impairment, HTN, HLD, CAD status post CABG, sent from home for suspicious seizure activity. Assessment & Plan:   Principal Problem:   Seizure ([place]) Active Problems:   Seizure disorder ([place])   Acute metabolic encephalopathy probably secondary to seizure activity EEG done does not show any breakthrough seizures. Mildly elevated ammonia level.  TSH within normal limits No source of infection found. Patient is alert and following simple commands SLP eval recommended dysphagia 3 diet Neurology on board and following. Continue with vimpat and keppra and on depakote 750 mg every 12 hours   Hyperlipidemia:  Resume statin.   In view of prior history of multiple strokes with residual hemiparesis cognitive impairment palliative care consulted and plan for discharge home with hospice when medically stable  Hyponatremia probably secondary to poor oral intake and dehydration Sodium has improved with just gentle hydration.    DVT prophylaxis: Lovenox Code Status: (DNR Family Communication: None at bedside, discussed with healthcare power of attorney over the phone disposition:   Status is: Inpatient Remains inpatient appropriate because: Further work-up for altered mental status           Consultants:  Urology Palliative care  Procedures:   Antimicrobials:  Antibiotics Given (last 72 hours)     None         Subjective: Calm, opened eyes to verbal cues and answering yes and no to questions.   Objective: Vitals:   03/08/21 2342 03/09/21 0223 03/09/21 0349 03/09/21 0724  BP: 96/64 106/73 111/88 119/84  Pulse: 93  90 95 89  Resp: _0 Temp: (!) 97.5 F (36.4 C) 98.7 F (37.1 C) 98.5 F (36.9 C) 98.5 F (36.9 C)  TempSrc: Oral Oral Oral Oral  SpO2: 95% 95% 97% 97%  Weight:        Intake/Output Summary (Last 24 hours) at 03/09/2021 0933 Last data filed at 03/09/2021 0300 Gross per 24 hour  Intake 1981.52 ml  Output --  Net 1981.52 ml   Filed Weights   03/08/21 1333  Weight: 70 kg    Examination:  General exam: Appears calm and comfortable  Respiratory system: Clear to auscultation. Respiratory effort normal. Cardiovascular system: S1 & S2 heard, RRR. No JVD,. No pedal edema. Gastrointestinal system: Abdomen is nondistended, soft and nontender.Normal bowel sounds heard. Central nervous system: Alert , has baseline cognitive impairments, left hemiparesis. Speech normal. Following simple commands.  Extremities: Symmetric 5 x 5 power. Skin: No rashes, lesions or ulcers Psychiatry: unable to assess    Data Reviewed: I have personally reviewed following labs and imaging studies  CBC: Recent Labs  Lab 03/08/21 1335 03/08/21 1341 03/09/21 0408  WBC 11.9*  --  10.3  NEUTROABS 7.1  --   --   HGB 14.0 14.3 12.4*  HCT 40.8 42.0 35.7*  MCV 94.7  --  95.2  PLT 246  --  811    Basic Metabolic Panel: Recent Labs  Lab 03/08/21 1335 03/08/21 1341 03/08/21 1409 03/09/21 0408  NA 127* 128*  --  132*  K 3.8 4.0  --  3.7  CL 93* 92*  --  101  CO2 25  --   --  23  GLUCOSE 111* 110*  --  93  BUN 8 9  --  6*  CREATININE 0.44* 0.30*  --  0.38*  CALCIUM 8.6*  --   --  8.2*  MG  --   --  1.7  --     GFR: CrCl cannot be calculated (Unknown ideal weight.).  Liver Function Tests: Recent Labs  Lab 03/08/21 1335  AST 25  ALT 21  ALKPHOS 51  BILITOT 0.5  PROT 5.4*  ALBUMIN 2.8*    CBG: Recent Labs  Lab 03/08/21 1331  GLUCAP 115*     Recent Results (from the past 240 hour(s))  Resp Panel by RT-PCR (Flu A&B, Covid) Nasopharyngeal Swab     Status: None   Collection  Time: 03/08/21  2:20 PM   Specimen: Nasopharyngeal Swab; Nasopharyngeal(NP) swabs in vial transport medium  Result Value Ref Range Status   SARS Coronavirus 2 by RT PCR NEGATIVE NEGATIVE Final    Comment: (NOTE) SARS-CoV-2 target nucleic acids are NOT DETECTED.  The SARS-CoV-2 RNA is generally detectable in upper respiratory specimens during the acute phase of infection. The lowest concentration of SARS-CoV-2 viral copies this assay can detect is 138 copies/mL. A negative result does not preclude SARS-Cov-2 infection and should not be used as the sole basis for treatment or other patient management decisions. A negative result may occur with  improper specimen collection/handling, submission of specimen other than nasopharyngeal swab, presence of viral mutation(s) within the areas targeted by this assay, and inadequate number of viral copies(<138 copies/mL). A negative result must be combined with clinical observations, patient history, and epidemiological information. The expected result is Negative.  Fact Sheet for Patients:  EntrepreneurPulse.com.au  Fact Sheet for Healthcare Providers:  IncredibleEmployment.be  This test is no t yet approved or cleared by the [place] FDA and  has been authorized for detection and/or diagnosis of SARS-CoV-2 by FDA under an Emergency Use Authorization (EUA). This EUA will remain  in effect (meaning this test can be used) for the duration of the COVID-19 declaration under Section 564(b)(1) of the Act, 21 U.S.C.section 360bbb-3(b)(1), unless the authorization is terminated  or revoked sooner.       Influenza A by PCR NEGATIVE NEGATIVE Final   Influenza B by PCR NEGATIVE NEGATIVE Final    Comment: (NOTE) The Xpert Xpress SARS-CoV-2/FLU/RSV plus assay is intended as an aid in the diagnosis of influenza from Nasopharyngeal swab specimens and should not be used as a sole basis for treatment. Nasal washings  and aspirates are unacceptable for Xpert Xpress SARS-CoV-2/FLU/RSV testing.  Fact Sheet for Patients: EntrepreneurPulse.com.au  Fact Sheet for Healthcare Providers: IncredibleEmployment.be  This test is not yet approved or cleared by the [place] FDA and has been authorized for detection and/or diagnosis of SARS-CoV-2 by FDA under an Emergency Use Authorization (EUA). This EUA will remain in effect (meaning this test can be used) for the duration of the COVID-19 declaration under Section 564(b)(1) of the Act, 21 U.S.C. section 360bbb-3(b)(1), unless the authorization is terminated or revoked.  Performed at [facility], [place] [address], [place], [place] [address]          Radiology Studies: DG CHEST PORT 1 VIEW  Result Date: 03/08/2021 CLINICAL DATA:  Cough EXAM: PORTABLE CHEST 1 VIEW COMPARISON:  Radiograph 02/05/2021 FINDINGS: Unchanged cardiomediastinal silhouette with prior median sternotomy and CABG. There is no focal airspace consolidation. There is no large pleural effusion. There is no visible pneumothorax. There is no acute  osseous abnormality. IMPRESSION: No evidence of acute cardiopulmonary disease. Electronically Signed   By: Maurine Simmering M.D.   On: 03/08/2021 15:32   EEG adult  Result Date: 03/08/2021 Lora Havens, MD     03/08/2021  4:08 PM Patient Name: [name] Cruz MRN: [id number] Epilepsy Attending: Lora Havens Referring Physician/Provider: Lorenza Chick, MD Date: 03/08/2021 Duration: 22.43 mins Patient history: 66yo M with a coughing spell and subsequently was unresponsive around noon. EEG to evaluate for seizure Level of alertness: Awake, asleep AEDs during EEG study: LEV, LCM, VPA Technical aspects: This EEG study was done with scalp electrodes positioned according to the 10-20 International system of electrode placement. Electrical activity was acquired at a sampling rate of _0  and reviewed with a high  frequency filter of _1  and a low frequency filter of _2 . EEG data were recorded continuously and digitally stored. Description: The posterior dominant rhythm consists of 7.5 Hz activity of moderate voltage (25-35 uV) seen predominantly in posterior head regions, symmetric and reactive to eye opening and eye closing. Sleep was characterized by vertex waves, maximal frontocentral region. EEG showed intermittent generalized  3 to 6 Hz theta-delta slowing. Hyperventilation and photic stimulation were not performed.   ABNORMALITY - Intermittent slow, generalized IMPRESSION: This study is suggestive of mild to moderate diffuse encephalopathy, nonspecific etiology. No seizures or epileptiform discharges were seen throughout the recording. Lora Havens   CT HEAD CODE STROKE WO CONTRAST  Result Date: 03/08/2021 CLINICAL DATA:  Code stroke. EXAM: CT HEAD WITHOUT CONTRAST TECHNIQUE: Contiguous axial images were obtained from the base of the skull through the vertex without intravenous contrast. RADIATION DOSE REDUCTION: This exam was performed according to the departmental dose-optimization program which includes automated exposure control, adjustment of the mA and/or kV according to patient size and/or use of iterative reconstruction technique. COMPARISON:  Brain MRI 07/20/2019, CT/CTA head and neck 05/25/2020 FINDINGS: Brain: There is no evidence of acute intracranial hemorrhage, extra-axial fluid collection, or acute infarct. There is a background of mild global parenchymal volume loss with prominence of the ventricular system and extra-axial CSF spaces. Encephalomalacia with associated dystrophic calcification in the left temporoparietal region with associated ex vacuo dilatation of the left lateral ventricle is unchanged. Additional extensive confluent hypodensity in the subcortical and periventricular white matter is also not significantly changed, likely reflecting advanced chronic white matter  microangiopathy. Gray-white differentiation is preserved. There is no solid mass lesion.  There is no midline shift. Vascular: There is calcification of the bilateral cavernous ICAs. Skull: Normal. Negative for fracture or focal lesion. Sinuses/Orbits: There is mild mucosal thickening in the paranasal sinuses. The globes and orbits are unremarkable. Other: None. ASPECTS [facility] Stroke Program Early CT Score) - Ganglionic level infarction (caudate, lentiform nuclei, internal capsule, insula, M1-M3 cortex): 7 - Supraganglionic infarction (M4-M6 cortex): 3 Total score (0-10 with 10 being normal): 10 IMPRESSION: 1. No evidence of acute intracranial hemorrhage or infarct. 2. ASPECTS is 10 3. Unchanged encephalomalacia and dystrophic calcification in the left temporoparietal region. These results were paged via AMION at the time of interpretation on 03/08/2021 at 1:54 pm to provider Bhagat. Electronically Signed   By: Valetta Mole M.D.   On: 03/08/2021 13:52        Scheduled Meds:  aspirin EC  81 mg Oral Daily   atorvastatin  80 mg Oral Daily   chlorhexidine gluconate (MEDLINE KIT)  15 mL Mouth Rinse BID   divalproex  750 mg Oral Q12H   enoxaparin (LOVENOX)  injection  40 mg Subcutaneous Q24H   lacosamide  200 mg Oral BID   levETIRAcetam  1,500 mg Oral BID   mouth rinse  15 mL Mouth Rinse 10 times per day   multivitamin with minerals  1 tablet Oral Daily   sertraline  25 mg Oral Daily   vitamin B-12  1,000 mcg Oral Daily   ziprasidone  20 mg Oral QHS   Continuous Infusions:  sodium chloride 100 mL/hr at 03/09/21 0757     LOS: 1 day       Hosie Poisson, MD [facility]   To contact the attending provider between 7A-7P or the covering provider during after hours 7P-7A, please log into the web site [email or web address] and access using universal [place] password for that web site. If you do not have the password, please call the hospital operator.  03/09/2021, 9:33 AM

## 2021-03-09 NOTE — Progress Notes (Signed)
Family voiced concern about skin on the feet and heels of this patient. After further assessment of myself and another RN it was concluded the patches on the right foot were only dry skin and the patches on the left foot were dry and one callus.

## 2021-03-10 LAB — BASIC METABOLIC PANEL
Anion gap: 9 (ref 5–15)
BUN: <5 mg/dL — ABNORMAL LOW (ref 8–23)
CO2: 22 mmol/L (ref 22–32)
Calcium: 7.7 mg/dL — ABNORMAL LOW (ref 8.9–10.3)
Chloride: 100 mmol/L (ref 98–111)
Creatinine, Ser: 0.33 mg/dL — ABNORMAL LOW (ref 0.61–1.24)
GFR, Estimated: >60 mL/min (ref >60)
Glucose, Bld: 89 mg/dL (ref 70–99)
Potassium: 3.2 mmol/L — ABNORMAL LOW (ref 3.5–5.1)
Sodium: 131 mmol/L — ABNORMAL LOW (ref 135–145)

## 2021-03-10 LAB — CBC WITH DIFFERENTIAL/PLATELET
Abs Immature Granulocytes: 0.06 10*3/uL (ref 0.00–0.07)
Basophils Absolute: 0 10*3/uL (ref 0.0–0.1)
Basophils Relative: 0 %
Eosinophils Absolute: 0.1 10*3/uL (ref 0.0–0.5)
Eosinophils Relative: 0 %
HCT: 30.3 % — ABNORMAL LOW (ref 39.0–52.0)
Hemoglobin: 10.4 g/dL — ABNORMAL LOW (ref 13.0–17.0)
Immature Granulocytes: 1 %
Lymphocytes Relative: 21 %
Lymphs Abs: 2.3 10*3/uL (ref 0.7–4.0)
MCH: 32.8 pg (ref 26.0–34.0)
MCHC: 34.3 g/dL (ref 30.0–36.0)
MCV: 95.6 fL (ref 80.0–100.0)
Monocytes Absolute: 1.4 10*3/uL — ABNORMAL HIGH (ref 0.1–1.0)
Monocytes Relative: 12 %
Neutro Abs: 7.4 10*3/uL (ref 1.7–7.7)
Neutrophils Relative %: 66 %
Platelets: 165 10*3/uL (ref 150–400)
RBC: 3.17 MIL/uL — ABNORMAL LOW (ref 4.22–5.81)
RDW: 13.1 % (ref 11.5–15.5)
WBC: 11.3 10*3/uL — ABNORMAL HIGH (ref 4.0–10.5)
nRBC: 0 % (ref 0.0–0.2)

## 2021-03-10 MED ORDER — POTASSIUM CHLORIDE CRYS ER 20 MEQ PO TBCR
40.0000 meq | EXTENDED_RELEASE_TABLET | Freq: Once | ORAL | Status: AC
  Administered 2021-03-10: 40 meq via ORAL
  Filled 2021-03-10: qty 2

## 2021-03-10 NOTE — Discharge Summary (Signed)
Physician Discharge Summary   Patient: [name] Cruz MRN: [id number] DOB: Aug 06, 1955  Admit date:     03/08/2021  Discharge date: 03/10/21  Discharge Physician: Kathlen Mody   PCP: Eloisa Northern, MD   Recommendations at discharge:  Please follow up with hospice MD as recommended.    Discharge Diagnoses: Principal Problem:   Seizure [facility]) Active Problems:   Seizure disorder [facility])     Hospital Course:  [name] Cruz is a 66 y.o. male with medical history significant of multiple strokes with residual left-sided hemiparesis, poststroke epilepsy, chronic aphasia, cognitive impairment, HTN, HLD, CAD status post CABG, sent from home for suspicious seizure activity. Assessment and Plan:   Acute metabolic encephalopathy probably secondary to seizure activity EEG done does not show any breakthrough seizures. Mildly elevated ammonia level.  TSH within normal limits No source of infection found. Patient is alert and following simple commands SLP eval recommended dysphagia 3 diet Neurology on board and following. Continue with vimpat and keppra and on depakote 750 mg every 12 hours     Hyperlipidemia:  Resume statin.    In view of prior history of multiple strokes with residual hemiparesis cognitive impairment palliative care consulted and plan for discharge home with hospice when medically stable   Hyponatremia probably secondary to poor oral intake and dehydration Sodium has improved with just gentle hydration.   Hypokalemia:  Replaced.     Consultants: neurology Palliative.  Procedures performed: CT head.   Disposition: Home Diet recommendation:  Discharge Diet Orders (From admission, onward)     Start     Ordered   03/10/21 0000  Diet - low sodium heart healthy        03/10/21 1242   03/10/21 0000  Diet - low sodium heart healthy        03/10/21 1242           Dysphagia type 3 thin Liquid  DISCHARGE MEDICATION: Allergies as of 03/10/2021       Reactions    Losartan Other (See Comments)   hyperkalemia        Medication List     TAKE these medications    aspirin EC 81 MG tablet Take 1 tablet (81 mg total) by mouth daily. Swallow whole.   atorvastatin 80 MG tablet Commonly known as: LIPITOR TAKE 1 TABLET BY MOUTH EVERY DAY   divalproex 250 MG DR tablet Commonly known as: DEPAKOTE Take 3 tablets (750 mg total) by mouth every 12 (twelve) hours.   lacosamide 200 MG Tabs tablet Commonly known as: Vimpat Take 1 tablet (200 mg total) by mouth 2 (two) times daily.   levETIRAcetam 750 MG tablet Commonly known as: KEPPRA TAKE 2 TABLETS (1,500 MG TOTAL) BY MOUTH 2 (TWO) TIMES DAILY.   Melatonin 10 MG Tabs Take 20 mg by mouth at bedtime as needed (sleep).   multivitamin with minerals Tabs tablet Take 1 tablet by mouth daily.   sertraline 25 MG tablet Commonly known as: ZOLOFT TAKE 1 TABLET BY MOUTH EVERY DAY   Vitamin B-12 1000 MCG Subl Place 1,000 mcg under the tongue daily.   ziprasidone 20 MG capsule Commonly known as: GEODON TAKE 1 CAPSULE BY MOUTH EVERYDAY AT BEDTIME What changed: See the new instructions.        Follow-up Information     [facility] Follow up.   Specialty: PALLIATIVE CARE Contact information: [address] [place] [id number] [phone]  Discharge Exam: Filed Weights   03/08/21 1333  Weight: 70 kg   General exam: Appears calm and comfortable  Respiratory system: Clear to auscultation. Respiratory effort normal. Cardiovascular system: S1 & S2 heard, RRR. No JVD,  No pedal edema. Gastrointestinal system: Abdomen is nondistended, soft and nontender. Normal bowel sounds heard. Central nervous system: Alert and answering yes and no questions.  Extremities: Symmetric 5 x 5 power. Skin: No rashes, lesions or ulcers Psychiatry: unable to assess due to dementia.    Condition at discharge: fair  The results of significant diagnostics  from this hospitalization (including imaging, microbiology, ancillary and laboratory) are listed below for reference.   Imaging Studies: DG CHEST PORT 1 VIEW  Result Date: 03/08/2021 CLINICAL DATA:  Cough EXAM: PORTABLE CHEST 1 VIEW COMPARISON:  Radiograph 02/05/2021 FINDINGS: Unchanged cardiomediastinal silhouette with prior median sternotomy and CABG. There is no focal airspace consolidation. There is no large pleural effusion. There is no visible pneumothorax. There is no acute osseous abnormality. IMPRESSION: No evidence of acute cardiopulmonary disease. Electronically Signed   By: Caprice RenshawJacob  Kahn M.D.   On: 03/08/2021 15:32   EEG adult  Result Date: 03/08/2021 Charlsie QuestYadav, Priyanka O, MD     03/08/2021  4:08 PM Patient Name: [name] Cruz MRN: [id number] Epilepsy Attending: Charlsie QuestPriyanka O Yadav Referring Physician/Provider: Gordy CouncilmanBhagat, Srishti L, MD Date: 03/08/2021 Duration: 22.43 mins Patient history: 66yo M with a coughing spell and subsequently was unresponsive around noon. EEG to evaluate for seizure Level of alertness: Awake, asleep AEDs during EEG study: LEV, LCM, VPA Technical aspects: This EEG study was done with scalp electrodes positioned according to the 10-20 International system of electrode placement. Electrical activity was acquired at a sampling rate of 500Hz  and reviewed with a high frequency filter of 70Hz  and a low frequency filter of 1Hz . EEG data were recorded continuously and digitally stored. Description: The posterior dominant rhythm consists of 7.5 Hz activity of moderate voltage (25-35 uV) seen predominantly in posterior head regions, symmetric and reactive to eye opening and eye closing. Sleep was characterized by vertex waves, maximal frontocentral region. EEG showed intermittent generalized  3 to 6 Hz theta-delta slowing. Hyperventilation and photic stimulation were not performed.   ABNORMALITY - Intermittent slow, generalized IMPRESSION: This study is suggestive of mild to moderate diffuse  encephalopathy, nonspecific etiology. No seizures or epileptiform discharges were seen throughout the recording. Charlsie QuestPriyanka O Yadav   CT HEAD CODE STROKE WO CONTRAST  Result Date: 03/08/2021 CLINICAL DATA:  Code stroke. EXAM: CT HEAD WITHOUT CONTRAST TECHNIQUE: Contiguous axial images were obtained from the base of the skull through the vertex without intravenous contrast. RADIATION DOSE REDUCTION: This exam was performed according to the departmental dose-optimization program which includes automated exposure control, adjustment of the mA and/or kV according to patient size and/or use of iterative reconstruction technique. COMPARISON:  Brain MRI 07/20/2019, CT/CTA head and neck 05/25/2020 FINDINGS: Brain: There is no evidence of acute intracranial hemorrhage, extra-axial fluid collection, or acute infarct. There is a background of mild global parenchymal volume loss with prominence of the ventricular system and extra-axial CSF spaces. Encephalomalacia with associated dystrophic calcification in the left temporoparietal region with associated ex vacuo dilatation of the left lateral ventricle is unchanged. Additional extensive confluent hypodensity in the subcortical and periventricular white matter is also not significantly changed, likely reflecting advanced chronic white matter microangiopathy. Gray-white differentiation is preserved. There is no solid mass lesion.  There is no midline shift. Vascular: There is calcification of the bilateral cavernous  ICAs. Skull: Normal. Negative for fracture or focal lesion. Sinuses/Orbits: There is mild mucosal thickening in the paranasal sinuses. The globes and orbits are unremarkable. Other: None. ASPECTS [facility] Stroke Program Early CT Score) - Ganglionic level infarction (caudate, lentiform nuclei, internal capsule, insula, M1-M3 cortex): 7 - Supraganglionic infarction (M4-M6 cortex): 3 Total score (0-10 with 10 being normal): 10 IMPRESSION: 1. No evidence of acute  intracranial hemorrhage or infarct. 2. ASPECTS is 10 3. Unchanged encephalomalacia and dystrophic calcification in the left temporoparietal region. These results were paged via AMION at the time of interpretation on 03/08/2021 at 1:54 pm to provider Bhagat. Electronically Signed   By: Lesia Hausen M.D.   On: 03/08/2021 13:52    Microbiology: Results for orders placed or performed during the hospital encounter of 03/08/21  Resp Panel by RT-PCR (Flu A&B, Covid) Nasopharyngeal Swab     Status: None   Collection Time: 03/08/21  2:20 PM   Specimen: Nasopharyngeal Swab; Nasopharyngeal(NP) swabs in vial transport medium  Result Value Ref Range Status   SARS Coronavirus 2 by RT PCR NEGATIVE NEGATIVE Final    Comment: (NOTE) SARS-CoV-2 target nucleic acids are NOT DETECTED.  The SARS-CoV-2 RNA is generally detectable in upper respiratory specimens during the acute phase of infection. The lowest concentration of SARS-CoV-2 viral copies this assay can detect is 138 copies/mL. A negative result does not preclude SARS-Cov-2 infection and should not be used as the sole basis for treatment or other patient management decisions. A negative result may occur with  improper specimen collection/handling, submission of specimen other than nasopharyngeal swab, presence of viral mutation(s) within the areas targeted by this assay, and inadequate number of viral copies(<138 copies/mL). A negative result must be combined with clinical observations, patient history, and epidemiological information. The expected result is Negative.  Fact Sheet for Patients:  BloggerCourse.com  Fact Sheet for Healthcare Providers:  SeriousBroker.it  This test is no t yet approved or cleared by the [place] FDA and  has been authorized for detection and/or diagnosis of SARS-CoV-2 by FDA under an Emergency Use Authorization (EUA). This EUA will remain  in effect (meaning this  test can be used) for the duration of the COVID-19 declaration under Section 564(b)(1) of the Act, 21 U.S.C.section 360bbb-3(b)(1), unless the authorization is terminated  or revoked sooner.       Influenza A by PCR NEGATIVE NEGATIVE Final   Influenza B by PCR NEGATIVE NEGATIVE Final    Comment: (NOTE) The Xpert Xpress SARS-CoV-2/FLU/RSV plus assay is intended as an aid in the diagnosis of influenza from Nasopharyngeal swab specimens and should not be used as a sole basis for treatment. Nasal washings and aspirates are unacceptable for Xpert Xpress SARS-CoV-2/FLU/RSV testing.  Fact Sheet for Patients: BloggerCourse.com  Fact Sheet for Healthcare Providers: SeriousBroker.it  This test is not yet approved or cleared by the [place] FDA and has been authorized for detection and/or diagnosis of SARS-CoV-2 by FDA under an Emergency Use Authorization (EUA). This EUA will remain in effect (meaning this test can be used) for the duration of the COVID-19 declaration under Section 564(b)(1) of the Act, 21 U.S.C. section 360bbb-3(b)(1), unless the authorization is terminated or revoked.  Performed at [facility], [address], [place], [place] [address]     Labs: CBC: Recent Labs  Lab 03/08/21 1335 03/08/21 1341 03/09/21 0408 03/10/21 0208  WBC 11.9*  --  10.3 11.3*  NEUTROABS 7.1  --   --  7.4  HGB 14.0  14.3 12.4* 10.4*  HCT 40.8 42.0 35.7* 30.3*  MCV 94.7  --  95.2 95.6  PLT 246  --  189 165   Basic Metabolic Panel: Recent Labs  Lab 03/08/21 1335 03/08/21 1341 03/08/21 1409 03/09/21 0408 03/10/21 0208  NA 127* 128*  --  132* 131*  K 3.8 4.0  --  3.7 3.2*  CL 93* 92*  --  101 100  CO2 25  --   --  23 22  GLUCOSE 111* 110*  --  93 89  BUN 8 9  --  6* <5*  CREATININE 0.44* 0.30*  --  0.38* 0.33*  CALCIUM 8.6*  --   --  8.2* 7.7*  MG  --   --  1.7  --   --    Liver Function Tests: Recent Labs  Lab  03/08/21 1335  AST 25  ALT 21  ALKPHOS 51  BILITOT 0.5  PROT 5.4*  ALBUMIN 2.8*   CBG: Recent Labs  Lab 03/08/21 1331  GLUCAP 115*    Discharge time spent: 39 minutes.   Signed: Kathlen Mody, MD [facility] 03/10/2021

## 2021-03-10 NOTE — Progress Notes (Deleted)
EEG was negative for electrographic seizures. Generalized intermittent slowing was suggestive of a mild to moderated diffuse encephalopathy.   Would continue with current anticonvulsant regimen.   No additional neurological recommendations.   Neurohospitalist service will sign off. Please call if there are additional questions.   Electronically signed: Dr. Caryl Pina

## 2021-03-10 NOTE — TOC Transition Note (Addendum)
Transition of Care [facility]) - CM/SW Discharge Note   Patient Details  Name: OLEG OLESON MRN: [id number] Date of Birth: 11/15/1955  Transition of Care [facility]) CM/SW Contact:  Lockie Pares, RN Phone Number: 03/10/2021, 1:45 PM   Clinical Narrative:     Patient for discharge today. [facility] has already spoken to family and has accepted them for home hospice care. Spoke to sister [facility]) Patient has all DME needed at home.he will need to come home by [facility] permission granted over the phone.   Messaged with RN with DC plans. Messged hospice. 1545 [facility] Pat called to speak to this RNCM regarding patient going home. She voiced concern as she had given several messages to me, I did not receive them. Worried that [facility] had already been called without hospice being notified. [facility] is backed up , they do not have a ETA at the time of call. Message from me sent vias securechart was not answered. Apologized for any miscommunication that occurred,. They request a call when patient leaves.  1700 patient has not left, RN will call [facility] and sister when patient leaves.Number for hospice given to RN  Final next level of care: Home w Hospice Care Barriers to Discharge: No Barriers Identified   Patient Goals and CMS Choice   CMS Medicare.gov Compare Post Acute Care list provided to:: Patient Represenative (must comment) Choice offered to / list presented to : Sibling  Discharge Placement                       Discharge Plan and Services   Discharge Planning Services: CM Consult                        [facility] Agency: [facility] Date [facility] Agency Contacted: 03/09/21   Representative spoke with at [facility] Agency: Cheri  Social Determinants of Health (SDOH) Interventions     Readmission Risk Interventions No flowsheet data found.

## 2021-03-10 NOTE — Care Management (Signed)
[facility] called for pickup. Med necessity form printed at station

## 2021-03-11 ENCOUNTER — Telehealth: Payer: Self-pay

## 2021-03-11 LAB — LACOSAMIDE: Lacosamide: 16.4 ug/mL — ABNORMAL HIGH (ref 5.0–10.0)

## 2021-03-11 NOTE — Telephone Encounter (Signed)
Left a message to follow up to make sure [facility] was called last night when patient was DC

## 2021-04-07 ENCOUNTER — Other Ambulatory Visit: Payer: Self-pay | Admitting: Adult Health

## 2021-04-07 DIAGNOSIS — G40909 Epilepsy, unspecified, not intractable, without status epilepticus: Secondary | ICD-10-CM

## 2021-04-17 ENCOUNTER — Telehealth: Payer: Self-pay

## 2021-04-17 NOTE — Chronic Care Management (AMB) (Signed)
? ? ?Chronic Care Management ?Pharmacy Assistant  ? ?Name: BELLA OHMANN  MRN: [id number] DOB: 03/01/1955 ? ? ? ?Reason for Encounter: Cholesterol Disease State ?  ?Recent office visits:  ?None since last CCM contact ? ?Recent consult visits:  ?None since last CCM consult ? ?Hospital visits:  ?Medication Reconciliation was completed by comparing discharge summary, patient?s EMR and Pharmacy list, and upon discussion with patient. ? ?Admitted to the hospital on 03/08/21 due to seizure disorder. Discharge date was 03/10/21. Discharged from [facility].   ? ?Medications that remain the same after Hospital Discharge:??  ?-All other medications will remain the same.   ? ?Medications: ?Outpatient Encounter Medications as of 04/17/2021  ?Medication Sig  ? aspirin EC 81 MG tablet Take 1 tablet (81 mg total) by mouth daily. Swallow whole.  ? atorvastatin (LIPITOR) 80 MG tablet TAKE 1 TABLET BY MOUTH EVERY DAY (Patient taking differently: Take 80 mg by mouth daily.)  ? Cyanocobalamin (VITAMIN B-12) 1000 MCG SUBL Place 1,000 mcg under the tongue daily.  ? divalproex (DEPAKOTE) 250 MG DR tablet Take 3 tablets (750 mg total) by mouth every 12 (twelve) hours.  ? lacosamide (VIMPAT) 200 MG TABS tablet Take 1 tablet (200 mg total) by mouth 2 (two) times daily.  ? levETIRAcetam (KEPPRA) 750 MG tablet TAKE 2 TABLETS (1,500 MG TOTAL) BY MOUTH 2 (TWO) TIMES DAILY.  ? Melatonin 10 MG TABS Take 20 mg by mouth at bedtime as needed (sleep).  ? Multiple Vitamin (MULTIVITAMIN WITH MINERALS) TABS tablet Take 1 tablet by mouth daily.  ? sertraline (ZOLOFT) 25 MG tablet TAKE 1 TABLET BY MOUTH EVERY DAY (Patient taking differently: Take 25 mg by mouth daily.)  ? ziprasidone (GEODON) 20 MG capsule TAKE 1 CAPSULE BY MOUTH EVERYDAY AT BEDTIME (Patient taking differently: 20 mg at bedtime.)  ? ?No facility-administered encounter medications on file as of 04/17/2021.  ? ? ?04/17/2021 ?Name: DELDRICK YOUTZ MRN: [id number] DOB: January 29, 1955 ?ABDULAI MAPSON  is a 66 y.o. year old male who is a primary care patient of Garwin Brothers, MD.  ?Comprehensive medication review performed; Spoke to patient regarding cholesterol. ? ? ?Contacted patient on 04/17/21 to discuss hyperlipidemia.  Spoke with the sister [name] and she confirmed the patient is under hospice care in mostly unresponsive state. ? ?Lipid Panel ?   ?Component Value Date/Time  ? CHOL 143 07/14/2019 1207  ? CHOL 177 07/11/2017 0950  ? TRIG 148.0 07/14/2019 1207  ? HDL 35.90 (L) 07/14/2019 1207  ? HDL 96 07/11/2017 0950  ? [place] 78 07/14/2019 1207  ? LDLCALC 65 07/11/2017 0950  ? LDLDIRECT 122.0 07/22/2016 0959  ?  ?10-year ASCVD risk score: The ASCVD Risk score (Arnett DK, et al., 2019) failed to calculate for the following reasons: ?  The patient has a prior MI or stroke diagnosis ? ?Current antihyperlipidemic regimen:  ?Atorvastatin 80 mg - 1 tablet daily ?Aspirin 81 mg - 1 tablet daily ? ?Previous antihyperlipidemic medications tried: zetia ? ?What recent interventions/DTPs have been made by any provider to improve Cholesterol control since last CPP Visit:  encouraged patient to update lipid panel- spoke with family of the patient and he is under hospice care. ? ?Any recent hospitalizations or ED visits since last visit with CPP? Yes 03/08/21 thru 03/10/21  seizure disorder-no medication changes  ? ? ?Adherence Review: ?Does the patient have >5 day gap between last estimated fill dates? No ? ? ?Star Rating Drugs ?Medication Name/Dose: Last fill date Days supply ?  Atorvastatin 80mg   02/07/21 90 ? ?Charlene Brooke, CPP notified ? ?Candace Begue, CCMA ?Health concierge  ?[phone]  ?

## 2021-04-23 ENCOUNTER — Other Ambulatory Visit: Payer: Self-pay | Admitting: Family Medicine

## 2021-04-23 ENCOUNTER — Other Ambulatory Visit: Payer: Self-pay | Admitting: Adult Health

## 2021-04-23 NOTE — Telephone Encounter (Signed)
plz touch base with sister - is pt still taking this? I believe this patient is on hospice, looks like has different PCP now?  ?

## 2021-04-23 NOTE — Telephone Encounter (Signed)
Refill request  [facility] ?Last refill 02/24/21 #90 ?

## 2021-04-25 NOTE — Telephone Encounter (Signed)
Spoke to patient's sister [name] Cruz and was advised to disregard the refill request. [name] Cruz stated that her brother is now under [facility] and all of his care is being provided by them. ?Dr. Danise Mina aware. ?

## 2021-04-25 NOTE — Telephone Encounter (Signed)
Left message on voicemail for patient's sister Steward Drone) to call the office back. ?

## 2021-05-04 ENCOUNTER — Other Ambulatory Visit: Payer: Self-pay | Admitting: Family Medicine

## 2021-05-16 NOTE — Telephone Encounter (Addendum)
Patient must be un-enrolled from CCM now that he in hospice care. ?

## 2021-06-09 ENCOUNTER — Other Ambulatory Visit: Payer: Self-pay | Admitting: Family Medicine

## 2021-06-09 ENCOUNTER — Other Ambulatory Visit: Payer: Self-pay | Admitting: Adult Health

## 2021-06-09 DIAGNOSIS — G40909 Epilepsy, unspecified, not intractable, without status epilepticus: Secondary | ICD-10-CM

## 2021-07-14 DEATH — deceased

## 2021-11-06 ENCOUNTER — Telehealth: Payer: Medicare Other
